# Patient Record
Sex: Male | Born: 1955
Health system: Southern US, Community
[De-identification: ages and names within clinical notes are randomized; demographics above are authoritative.]

## PROBLEM LIST (undated history)

## (undated) DIAGNOSIS — M549 Dorsalgia, unspecified: Secondary | ICD-10-CM

## (undated) DIAGNOSIS — E119 Type 2 diabetes mellitus without complications: Secondary | ICD-10-CM

## (undated) DIAGNOSIS — K219 Gastro-esophageal reflux disease without esophagitis: Secondary | ICD-10-CM

## (undated) DIAGNOSIS — F1910 Other psychoactive substance abuse, uncomplicated: Secondary | ICD-10-CM

## (undated) DIAGNOSIS — E785 Hyperlipidemia, unspecified: Secondary | ICD-10-CM

## (undated) DIAGNOSIS — I509 Heart failure, unspecified: Secondary | ICD-10-CM

## (undated) DIAGNOSIS — N184 Chronic kidney disease, stage 4 (severe): Secondary | ICD-10-CM

## (undated) DIAGNOSIS — Z95810 Presence of automatic (implantable) cardiac defibrillator: Secondary | ICD-10-CM

## (undated) DIAGNOSIS — G8929 Other chronic pain: Secondary | ICD-10-CM

## (undated) DIAGNOSIS — I429 Cardiomyopathy, unspecified: Secondary | ICD-10-CM

## (undated) DIAGNOSIS — K5790 Diverticulosis of intestine, part unspecified, without perforation or abscess without bleeding: Secondary | ICD-10-CM

## (undated) DIAGNOSIS — Z87898 Personal history of other specified conditions: Secondary | ICD-10-CM

## (undated) DIAGNOSIS — I428 Other cardiomyopathies: Secondary | ICD-10-CM

## (undated) DIAGNOSIS — B351 Tinea unguium: Secondary | ICD-10-CM

## (undated) DIAGNOSIS — I10 Essential (primary) hypertension: Secondary | ICD-10-CM

## (undated) DIAGNOSIS — I2510 Atherosclerotic heart disease of native coronary artery without angina pectoris: Secondary | ICD-10-CM

## (undated) HISTORY — PX: APPENDECTOMY: SHX54

## (undated) HISTORY — DX: Gastro-esophageal reflux disease without esophagitis: K21.9

## (undated) HISTORY — DX: Essential (primary) hypertension: I10

## (undated) HISTORY — DX: Hyperlipidemia, unspecified: E78.5

## (undated) HISTORY — DX: Chronic kidney disease, stage 4 (severe): N18.4

## (undated) HISTORY — DX: Tinea unguium: B35.1

## (undated) HISTORY — DX: Other chronic pain: G89.29

## (undated) HISTORY — DX: Cardiomyopathy, unspecified: I42.9

## (undated) HISTORY — DX: Dorsalgia, unspecified: M54.9

## (undated) HISTORY — DX: Presence of automatic (implantable) cardiac defibrillator: Z95.810

## (undated) HISTORY — DX: Diverticulosis of intestine, part unspecified, without perforation or abscess without bleeding: K57.90

## (undated) HISTORY — DX: Personal history of other specified conditions: Z87.898

## (undated) HISTORY — DX: Type 2 diabetes mellitus without complications: E11.9

## (undated) HISTORY — DX: Heart failure, unspecified: I50.9

## (undated) NOTE — *Deleted (*Deleted)
[facility] Note  12/30/2019     CHIEF COMPLAINT Patient presents for No chief complaint on file.   HISTORY OF PRESENT ILLNESS: Brad Mendoza is a 28 y.o. male who presents to the clinic today for:   pt states no change in vision  Referring physician: [name], MD [place],  [place] [address]  HISTORICAL INFORMATION:   Selected notes from the MEDICAL RECORD NUMBER Referred by Dr. [name] for concern of CSME OS;  LEE- 04.04.19 ([name]) [BCVA OD: 20/20 OS: 20/40] Ocular Hx-  PMH- DM, CAD, CHF, HTN, hyperlipidemia    CURRENT MEDICATIONS: No current outpatient medications on file. (Ophthalmic Drugs)   No current facility-administered medications for this visit. (Ophthalmic Drugs)   Current Outpatient Medications (Other)  Medication Sig  . aspirin EC 81 MG tablet Take 81 mg by mouth daily.  . blood glucose meter kit and supplies KIT Dispense based on patient and insurance preference. Test four times daily as directed. (FOR ICD-10 : E11.9  . Blood Glucose Monitoring Suppl (ONETOUCH VERIO) w/Device KIT 1 Device by Does not apply route 2 (two) times daily.  . Capsaicin (ICY HOT ARTHRITIS THERAPY EX) Apply 1 application topically 2 (two) times daily as needed (back pain).   . carvedilol (COREG) 6.25 MG tablet Take 1 tablet (6.25 mg total) by mouth 2 (two) times daily with a meal.  . Cholecalciferol (VITAMIN D3) 25 MCG (1000 UT) CAPS Take 2,000 Units by mouth daily.  . Continuous Blood Gluc Sensor (FREESTYLE LIBRE 2 SENSOR) MISC Use to test blood sugar up to 6 times daily as directed. E11.9  . fluticasone (FLONASE) 50 MCG/ACT nasal spray USE 2 SPRAYS IN EACH NOSTRIL DAILY  . glucose blood (ONETOUCH VERIO) test strip USE FOUR TIMES DAILY AS NEEDED Dx E11.9  . hydrALAZINE (APRESOLINE) 100 MG tablet TAKE (1) TABLET BY MOUTH (3) TIMES DAILY.  [name] icosapent Ethyl (VASCEPA) 1 g capsule Take 2 capsules (2 g total) by mouth 2 (two) times daily.  .  insulin glargine, 2 Unit Dial, (TOUJEO MAX SOLOSTAR) 300 UNIT/ML Solostar Pen Inject 50 units each morning and 40 units each evening  . isosorbide mononitrate (IMDUR) 120 MG 24 hr tablet TAKE (1) TABLET BY MOUTH ONCE DAILY.  [name] Lancets (ONETOUCH ULTRASOFT) lancets Use as instructed   DX E11.9  . loratadine (CLARITIN) 10 MG tablet Take 1 tablet (10 mg total) by mouth daily.  . Multiple Vitamins-Minerals (CENTRUM SILVER ADULT 50+ PO) Take 1 tablet by mouth daily.  . nabumetone (RELAFEN) 500 MG tablet TAKE TWO TABLETS BY MOUTH TWICE DAILY  . nitroGLYCERIN (NITROSTAT) 0.4 MG SL tablet DISSOLVE 1 TAB UNDER TOUNGE FOR CHEST PAIN. MAY REPEAT EVERY 5 MINUTES FOR 3 DOSES. IF NO RELIEF CALL 911 OR GO TO ER  . Omega-3 Fatty Acids (FISH OIL) 1000 MG CPDR Take 1,000 mg by mouth daily.   . pantoprazole (PROTONIX) 40 MG tablet Take 1 tablet (40 mg total) by mouth daily.  . potassium chloride SA (KLOR-CON) 20 MEQ tablet TAKE (1) TABLET BY MOUTH ONCE DAILY.  [name] pregabalin (LYRICA) 200 MG capsule Take 2 capsules (400 mg total) by mouth at bedtime.  [name] PROAIR HFA 108 (90 Base) MCG/ACT inhaler USE 2 PUFFS EVERY 6 HOURS AS NEEDED  . ranolazine (RANEXA) 500 MG 12 hr tablet TAKE 1 TABLET BY MOUTH TWICE DAILY.  . rosuvastatin (CRESTOR) 10 MG tablet TAKE 1 TABLET BY MOUTH ONCE A DAY.  [name]  rosuvastatin (CRESTOR) 20 MG tablet Take 20 mg by mouth daily.  . Semaglutide, 1 MG/DOSE, (OZEMPIC, 1 MG/DOSE,) 2 MG/1.5ML SOPN Inject 1 mg into the skin once a week.  . terbinafine (LAMISIL) 1 % cream Apply 1 application topically 2 (two) times daily. Apply to both feet and between toes  . tiZANidine (ZANAFLEX) 4 MG tablet Take 1 tablet (4 mg total) by mouth every 6 (six) hours as needed for muscle spasms.  [name] torsemide (DEMADEX) 20 MG tablet TAKE 3 TABLETS BY MOUTH DAILY.  [name] Vitamin D, Ergocalciferol, (DRISDOL) 1.25 MG (50000 UNIT) CAPS capsule Take 1 capsule (50,000 Units total) by mouth every 7 (seven) days.  Alveda Reasons 2.5 MG TABS tablet  TAKE (1) TABLET BY MOUTH TWICE DAILY.   No current facility-administered medications for this visit. (Other)      REVIEW OF SYSTEMS:    ALLERGIES Allergies  Allergen Reactions  . Sulfa Antibiotics Shortness Of Breath  . Penicillins Rash and Other (See Comments)    Has patient had a PCN reaction causing immediate rash, facial/tongue/throat swelling, SOB or lightheadedness with hypotension: No Has patient had a PCN reaction causing severe rash involving mucus membranes or skin necrosis: No Has patient had a PCN reaction that required hospitalization No Has patient had a PCN reaction occurring within the last 10 years: No If all of the above answers are "NO", then may proceed with Cephalosporin use.     PAST MEDICAL HISTORY Past Medical History:  Diagnosis Date  . CAD (coronary artery disease)    DES to RCA January 2020  . Chronic back pain   . CKD (chronic kidney disease), stage IV ([place])   . Diverticulosis   . Essential hypertension   . GERD (gastroesophageal reflux disease)   . Hyperlipidemia   . ICD (implantable cardioverter-defibrillator) in place    [facility] [employer] - Dr. Lovena Le, implanted April 2021  . Onychomycosis   . Polysubstance abuse ([place])   . 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 Type 2 diabetes mellitus ([place])    Past Surgical History:  Procedure Laterality Date  . APPENDECTOMY    . CARDIAC CATHETERIZATION  2005   4 frech cath  . COLONOSCOPY N/A 08/01/2013   Procedure: COLONOSCOPY;  Surgeon: Rogene Houston, MD;  [facility];  Service: Endoscopy;  Laterality: N/A;  rescheduled to [place] notified pt  . CORONARY STENT INTERVENTION N/A 03/19/2018   Procedure: CORONARY STENT INTERVENTION;  Surgeon: Leonie Man, MD;  Location: [place] [facility];  Service: Cardiovascular;  Laterality: N/A;  . ESOPHAGOGASTRODUODENOSCOPY N/A 04/23/2015   Procedure: ESOPHAGOGASTRODUODENOSCOPY (EGD);  Surgeon: Rogene Houston, MD;   [facility];  Service: Endoscopy;  Laterality: N/A;  1:25  . RIGHT/LEFT HEART CATH AND CORONARY ANGIOGRAPHY N/A 06/03/2016   Procedure: Right/Left Heart Cath and Coronary Angiography;  Surgeon: Belva Crome, MD;  Location: [place] [facility];  Service: Cardiovascular;  Laterality: N/A;  . RIGHT/LEFT HEART CATH AND CORONARY ANGIOGRAPHY N/A 03/19/2018   Procedure: RIGHT/LEFT HEART CATH AND CORONARY ANGIOGRAPHY;  Surgeon: Jolaine Artist, MD;  Location: [place] [facility];  Service: Cardiovascular;  Laterality: N/A;  . SUBQ ICD IMPLANT N/A 05/27/2019   Procedure: SUBQ ICD IMPLANT;  Surgeon: Evans Lance, MD;  Location: [place] [facility];  Service: Cardiovascular;  Laterality: N/A;    FAMILY HISTORY Family History  Problem Relation Age of Onset  . Hypertension Father   . Diabetes Father   .  Lung cancer Mother   . Alcohol abuse Brother   . Colon cancer Neg Hx     SOCIAL HISTORY Social History   Tobacco Use  . Smoking status: Never Smoker  . Smokeless tobacco: Current User    Types: Snuff  . Tobacco comment: dips snuff about 6 cans/week for 3 years  Vaping Use  . Vaping Use: Never used  Substance Use Topics  . Alcohol use: Yes    Alcohol/week: 1.0 standard drink    Types: 1 Cans of beer per week    Comment: occ - has cut down, now once every 2-3 months  . Drug use: Yes    Types: Marijuana    Comment: occasional use - smoking less         OPHTHALMIC EXAM:  Not recorded     IMAGING AND PROCEDURES  Imaging and Procedures for 05/31/17           ASSESSMENT/PLAN:    ICD-10-CM   1. Moderate nonproliferative diabetic retinopathy of both eyes with macular edema associated with type 2 diabetes mellitus ([place])  [id number]   2. Retinal edema  H35.81   3. Essential hypertension  I10   4. Hypertensive retinopathy of both eyes  H35.033   5. Combined forms of age-related cataract of both eyes  H25.813     1,2. Moderate to severe non-proliferative diabetic  retinopathy, OU  - delayed f/u from 4 wks to 8 mos due to contracting COVID in Dec 2020  - initial exam showed scattered IRH, exudates, and cotton wool spots, OS > OD, no NV  - FA 4.10.19 shows no NV OU  - S/P IVA OS #1 (05.29.19), #2 (06.26.19), #3 (07.29.19), #4 (07.22.21), #5 (09.22.21)  - S/P IVE OS #1 (08.30.19), #2 (10.01.19), #3 (10.31.19), #4 (03.02.20), #5 (04.07.20), #6 (07.01.20), #7 (11.12.20), #8 (07.22.21), #9 (09.22.21)  - S/P IVA OD #1 (04.07.20), #2 (07.01.20), #3 (11.12.20), #4 (07.22.2021)  - last A1c >14% on 7.13.21 -- pt reports poor control due to poor diet  - exam shows DME / cystic changes OU  - OCT shows interval increase in diabetic macular edema OD; interval improvement in IRF/cystic changes OS  - BCVA 20/40 OD (stable), 20/50 OS (dec from 20/60)  - recommend IVA #6 OD and IVE #10 OS today, 11.08.21  - pt wishes to proceed  - RBA of procedure discussed, questions answered  - informed consent obtained and signed  - see procedure note  - Eylea[employer]U paper work and benefits investigation started on 07.29.19 -- Approved for Good Days for 2021  - f/u in 4 weeks -- DFE; OCT; possible injection  3,4. Hypertensive retinopathy OU  - discussed importance of tight BP control  - monitor  5. Combined form age-related cataract OU-   - The symptoms of cataract, surgical options, and treatments and risks were discussed with patient.  - discussed diagnosis and progression  - not yet visually significant  - monitor for now   Ophthalmic Meds Ordered this visit:  No orders of the defined types were placed in this encounter.      No follow-ups on file.  There are no Patient Instructions on file for this visit.  This document serves as a record of services personally performed by Gardiner Sleeper, MD, PhD. It was created on their behalf by Leeann Must, [place], an ophthalmic technician. The creation of this record is the provider's dictation and/or activities during the visit.     Electronically signed by: Leeann Must,  COA @TODAY @ 1:56 PM   Abbreviations: M myopia (nearsighted); A astigmatism; H hyperopia (farsighted); P presbyopia; Mrx spectacle prescription;  CTL contact lenses; OD right eye; OS left eye; OU both eyes  XT exotropia; ET esotropia; PEK punctate epithelial keratitis; PEE punctate epithelial erosions; DES dry eye syndrome; MGD meibomian gland dysfunction; ATs artificial tears; PFAT's preservative free artificial tears; [place] nuclear sclerotic cataract; PSC posterior subcapsular cataract; ERM epi-retinal membrane; PVD posterior vitreous detachment; RD retinal detachment; DM diabetes mellitus; DR diabetic retinopathy; NPDR non-proliferative diabetic retinopathy; PDR proliferative diabetic retinopathy; CSME clinically significant macular edema; DME diabetic macular edema; dbh dot blot hemorrhages; CWS cotton wool spot; POAG primary open angle glaucoma; C/D cup-to-disc ratio; HVF humphrey visual field; GVF goldmann visual field; OCT optical coherence tomography; IOP intraocular pressure; BRVO Branch retinal vein occlusion; CRVO central retinal vein occlusion; CRAO central retinal artery occlusion; BRAO branch retinal artery occlusion; RT retinal tear; SB scleral buckle; PPV pars plana vitrectomy; VH Vitreous hemorrhage; PRP panretinal laser photocoagulation; IVK intravitreal kenalog; VMT vitreomacular traction; MH Macular hole;  NVD neovascularization of the disc; NVE neovascularization elsewhere; AREDS age related eye disease study; ARMD age related macular degeneration; POAG primary open angle glaucoma; EBMD epithelial/anterior basement membrane dystrophy; ACIOL anterior chamber intraocular lens; IOL intraocular lens; PCIOL posterior chamber intraocular lens; Phaco/IOL phacoemulsification with intraocular lens placement; [place] photorefractive keratectomy; LASIK laser assisted in situ keratomileusis; HTN hypertension; DM diabetes mellitus; COPD chronic obstructive pulmonary  disease

## (undated) NOTE — *Deleted (*Deleted)
[facility] Note  12/11/2019     CHIEF COMPLAINT Patient presents for No chief complaint on file.   HISTORY OF PRESENT ILLNESS: Brad Mendoza is a 46 y.o. male who presents to the clinic today for:   pt states no change in vision  Referring physician: [name], MD [place],  [place] [address]  HISTORICAL INFORMATION:   Selected notes from the MEDICAL RECORD NUMBER Referred by Dr. Milagros Mendoza for concern of CSME OS;  LEE- 04.04.19 ([name]) [BCVA OD: 20/20 OS: 20/40] Ocular Hx-  PMH- DM, CAD, CHF, HTN, hyperlipidemia    CURRENT MEDICATIONS: No current outpatient medications on file. (Ophthalmic Drugs)   No current facility-administered medications for this visit. (Ophthalmic Drugs)   Current Outpatient Medications (Other)  Medication Sig  . aspirin EC 81 MG tablet Take 81 mg by mouth daily.  . blood glucose meter kit and supplies KIT Dispense based on patient and insurance preference. Test four times daily as directed. (FOR ICD-10 : E11.9  . Blood Glucose Monitoring Suppl (ONETOUCH VERIO) w/Device KIT 1 Device by Does not apply route 2 (two) times daily.  . Capsaicin (ICY HOT ARTHRITIS THERAPY EX) Apply 1 application topically 2 (two) times daily as needed (back pain).   . carvedilol (COREG) 6.25 MG tablet Take 1 tablet (6.25 mg total) by mouth 2 (two) times daily with a meal.  . Cholecalciferol (VITAMIN D3) 25 MCG (1000 UT) CAPS Take 2,000 Units by mouth daily.  . Continuous Blood Gluc Sensor (FREESTYLE LIBRE 2 SENSOR) MISC Use to test blood sugar up to 6 times daily as directed. E11.9  . fluticasone (FLONASE) 50 MCG/ACT nasal spray USE 2 SPRAYS IN EACH NOSTRIL DAILY  . glucose blood (ONETOUCH VERIO) test strip USE FOUR TIMES DAILY AS NEEDED Dx E11.9  . hydrALAZINE (APRESOLINE) 100 MG tablet TAKE (1) TABLET BY MOUTH (3) TIMES DAILY.  [name] icosapent Ethyl (VASCEPA) 1 g capsule Take 2 capsules (2 g total) by mouth 2 (two) times daily.  .  insulin glargine, 2 Unit Dial, (TOUJEO MAX SOLOSTAR) 300 UNIT/ML Solostar Pen Inject 50 units each morning and 40 units each evening  . isosorbide mononitrate (IMDUR) 120 MG 24 hr tablet TAKE (1) TABLET BY MOUTH ONCE DAILY.  [name] Lancets (ONETOUCH ULTRASOFT) lancets Use as instructed   DX E11.9  . loratadine (CLARITIN) 10 MG tablet Take 1 tablet (10 mg total) by mouth daily.  . Multiple Vitamins-Minerals (CENTRUM SILVER ADULT 50+ PO) Take 1 tablet by mouth daily.  . nabumetone (RELAFEN) 500 MG tablet TAKE TWO TABLETS BY MOUTH TWICE DAILY  . nitroGLYCERIN (NITROSTAT) 0.4 MG SL tablet DISSOLVE 1 TAB UNDER TOUNGE FOR CHEST PAIN. MAY REPEAT EVERY 5 MINUTES FOR 3 DOSES. IF NO RELIEF CALL 911 OR GO TO ER  . Omega-3 Fatty Acids (FISH OIL) 1000 MG CPDR Take 1,000 mg by mouth daily.   . pantoprazole (PROTONIX) 40 MG tablet Take 1 tablet (40 mg total) by mouth daily.  . potassium chloride SA (KLOR-CON) 20 MEQ tablet TAKE (1) TABLET BY MOUTH ONCE DAILY.  [name] pregabalin (LYRICA) 200 MG capsule Take 2 capsules (400 mg total) by mouth at bedtime.  [name] PROAIR HFA 108 (90 Base) MCG/ACT inhaler USE 2 PUFFS EVERY 6 HOURS AS NEEDED  . ranolazine (RANEXA) 500 MG 12 hr tablet TAKE 1 TABLET BY MOUTH TWICE DAILY.  . rosuvastatin (CRESTOR) 20 MG tablet Take 20 mg by mouth daily.  . Semaglutide, 1  MG/DOSE, (OZEMPIC, 1 MG/DOSE,) 2 MG/1.5ML SOPN Inject 1 mg into the skin once a week.  . Semaglutide,0.25 or 0.5MG /DOS, (OZEMPIC, 0.25 OR 0.5 MG/DOSE,) 2 MG/1.5ML SOPN Inject 0.5 mg into the skin once a week.  . terbinafine (LAMISIL) 1 % cream Apply 1 application topically 2 (two) times daily. Apply to both feet and between toes  . tiZANidine (ZANAFLEX) 4 MG tablet Take 1 tablet (4 mg total) by mouth every 6 (six) hours as needed for muscle spasms.  [name] torsemide (DEMADEX) 20 MG tablet TAKE 3 TABLETS BY MOUTH DAILY.  [name] XARELTO 2.5 MG TABS tablet TAKE (1) TABLET BY MOUTH TWICE DAILY.   No current facility-administered medications for  this visit. (Other)      REVIEW OF SYSTEMS:    ALLERGIES Allergies  Allergen Reactions  . Sulfa Antibiotics Shortness Of Breath  . Penicillins Rash and Other (See Comments)    Has patient had a PCN reaction causing immediate rash, facial/tongue/throat swelling, SOB or lightheadedness with hypotension: No Has patient had a PCN reaction causing severe rash involving mucus membranes or skin necrosis: No Has patient had a PCN reaction that required hospitalization No Has patient had a PCN reaction occurring within the last 10 years: No If all of the above answers are "NO", then may proceed with Cephalosporin use.     PAST MEDICAL HISTORY Past Medical History:  Diagnosis Date  . CAD (coronary artery disease)    DES to RCA January 2020  . Chronic back pain   . CKD (chronic kidney disease), stage IV ([place])   . Diverticulosis   . Essential hypertension   . GERD (gastroesophageal reflux disease)   . Hyperlipidemia   . ICD (implantable cardioverter-defibrillator) in place    [facility] - Dr. Lovena Le, implanted April 2021  . Onychomycosis   . Polysubstance abuse ([place] Mendoza)   . 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 Type 2 diabetes mellitus ([name])    Past Surgical History:  Procedure Laterality Date  . APPENDECTOMY    . CARDIAC CATHETERIZATION  2005   4 frech cath  . COLONOSCOPY N/A 08/01/2013   Procedure: COLONOSCOPY;  Surgeon: Brad Houston, MD;  [facility];  Service: Endoscopy;  Laterality: N/A;  rescheduled to [place] notified pt  . CORONARY STENT INTERVENTION N/A 03/19/2018   Procedure: CORONARY STENT INTERVENTION;  Surgeon: Brad Man, MD;  Location: [place] [facility];  Service: Cardiovascular;  Laterality: N/A;  . ESOPHAGOGASTRODUODENOSCOPY N/A 04/23/2015   Procedure: ESOPHAGOGASTRODUODENOSCOPY (EGD);  Surgeon: Brad Houston, MD;  [facility];  Service: Endoscopy;  Laterality: N/A;  1:25  . RIGHT/LEFT HEART  CATH AND CORONARY ANGIOGRAPHY N/A 06/03/2016   Procedure: Right/Left Heart Cath and Coronary Angiography;  Surgeon: Belva Crome, MD;  Location: [place] [facility];  Service: Cardiovascular;  Laterality: N/A;  . RIGHT/LEFT HEART CATH AND CORONARY ANGIOGRAPHY N/A 03/19/2018   Procedure: RIGHT/LEFT HEART CATH AND CORONARY ANGIOGRAPHY;  Surgeon: Jolaine Artist, MD;  Location: [place] [facility];  Service: Cardiovascular;  Laterality: N/A;  . SUBQ ICD IMPLANT N/A 05/27/2019   Procedure: SUBQ ICD IMPLANT;  Surgeon: Evans Lance, MD;  Location: [place] [facility];  Service: Cardiovascular;  Laterality: N/A;    FAMILY HISTORY Family History  Problem Relation Age of Onset  . Hypertension Father   . Diabetes Father   . Lung cancer Mother   . Alcohol abuse Brother   . Colon cancer  Neg Hx     SOCIAL HISTORY Social History   Tobacco Use  . Smoking status: Never Smoker  . Smokeless tobacco: Current User    Types: Snuff  . Tobacco comment: dips snuff about 6 cans/week for 3 years  Vaping Use  . Vaping Use: Never used  Substance Use Topics  . Alcohol use: Yes    Alcohol/week: 1.0 standard drink    Types: 1 Cans of beer per week    Comment: occ - has cut down, now once every 2-3 months  . Drug use: Yes    Types: Marijuana    Comment: occasional use - smoking less         OPHTHALMIC EXAM:  Not recorded     IMAGING AND PROCEDURES  Imaging and Procedures for 05/31/17           ASSESSMENT/PLAN:    ICD-10-CM   1. Moderate nonproliferative diabetic retinopathy of both eyes with macular edema associated with type 2 diabetes mellitus ([place])  [id number]   2. Retinal edema  H35.81   3. Essential hypertension  I10   4. Hypertensive retinopathy of both eyes  H35.033   5. Combined forms of age-related cataract of both eyes  H25.813     1,2. Moderate to severe non-proliferative diabetic retinopathy, OU  - delayed f/u from 4 wks to 8 mos due to contracting COVID in Dec 2020  -  initial exam showed scattered [place], exudates, and cotton wool spots, OS > OD, no NV  - FA 4.10.19 shows no NV OU  - S/P IVA OS #1 (05.29.19), #2 (06.26.19), #3 (07.29.19), #4 (07.22.21)  - S/P IVE OS #1 (08.30.19), #2 (10.01.19), #3 (10.31.19), #4 (03.02.20), #5 (04.07.20), #6 (07.01.20), #7 (11.12.20), #8 (07.22.21), #9 (09.22.2021)  - S/P IVA OD #1 (04.07.20), #2 (07.01.20), #3 (11.12.20), #4 (07.22.2021), #5 (09.22.2021)  - last A1c >14% on 7.13.21 -- pt reports poor control due to poor diet  - exam shows DME / cystic changes OU  - OCT shows interval increase in diabetic macular edema OD; interval improvement in IRF/cystic changes OS  - BCVA 20/40 OD (stable), 20/50 OS (dec from 20/60)  - recommend IVA #6 OD and IVE #10 OS today, 10.20.21  - pt wishes to proceed  - RBA of procedure discussed, questions answered  - informed consent obtained and signed  - see procedure note  - Eylea[employer]U paper work and benefits investigation started on 07.29.19 -- Approved for Good Days for 2021  - f/u in 4 weeks -- DFE; OCT; possible injection  3,4. Hypertensive retinopathy OU  - discussed importance of tight BP control  - monitor  5. Combined form age-related cataract OU-   - The symptoms of cataract, surgical options, and treatments and risks were discussed with patient.  - discussed diagnosis and progression  - not yet visually significant  - monitor for now   Ophthalmic Meds Ordered this visit:  No orders of the defined types were placed in this encounter.      No follow-ups on file.  There are no Patient Instructions on file for this visit.   Explained the diagnoses, plan, and follow up with the patient and they expressed understanding.  Patient expressed understanding of the importance of proper follow up care.   This document serves as a record of services personally performed by Gardiner Sleeper, MD, PhD. It was created on their behalf by Roselee Nova, [place]. The creation of this record  is the provider's dictation and/or  activities during the visit.  Electronically signed by: Roselee Nova, [place] 12/09/19 12:14 PM    Gardiner Sleeper, M.D., Ph.D. Diseases & Surgery of the Retina and Vitreous [facility]: M myopia (nearsighted); A astigmatism; H hyperopia (farsighted); P presbyopia; Mrx spectacle prescription;  CTL contact lenses; OD right eye; OS left eye; OU both eyes  XT exotropia; ET esotropia; PEK punctate epithelial keratitis; PEE punctate epithelial erosions; DES dry eye syndrome; MGD meibomian gland dysfunction; ATs artificial tears; PFAT's preservative free artificial tears; [place] nuclear sclerotic cataract; PSC posterior subcapsular cataract; ERM epi-retinal membrane; PVD posterior vitreous detachment; RD retinal detachment; DM diabetes mellitus; DR diabetic retinopathy; NPDR non-proliferative diabetic retinopathy; PDR proliferative diabetic retinopathy; CSME clinically significant macular edema; DME diabetic macular edema; dbh dot blot hemorrhages; CWS cotton wool spot; POAG primary open angle glaucoma; C/D cup-to-disc ratio; HVF humphrey visual field; GVF goldmann visual field; OCT optical coherence tomography; IOP intraocular pressure; BRVO Branch retinal vein occlusion; CRVO central retinal vein occlusion; CRAO central retinal artery occlusion; BRAO branch retinal artery occlusion; RT retinal tear; SB scleral buckle; PPV pars plana vitrectomy; VH Vitreous hemorrhage; PRP panretinal laser photocoagulation; IVK intravitreal kenalog; VMT vitreomacular traction; MH Macular hole;  NVD neovascularization of the disc; NVE neovascularization elsewhere; AREDS age related eye disease study; ARMD age related macular degeneration; POAG primary open angle glaucoma; EBMD epithelial/anterior basement membrane dystrophy; ACIOL anterior chamber intraocular lens; IOL intraocular lens; PCIOL posterior chamber intraocular lens; Phaco/IOL phacoemulsification  with intraocular lens placement; [place] photorefractive keratectomy; LASIK laser assisted in situ keratomileusis; HTN hypertension; DM diabetes mellitus; COPD chronic obstructive pulmonary disease

---

## 2000-06-01 ENCOUNTER — Ambulatory Visit (HOSPITAL_COMMUNITY): Admission: RE | Admit: 2000-06-01 | Discharge: 2000-06-01 | Payer: Self-pay | Admitting: Family Medicine

## 2000-06-01 ENCOUNTER — Encounter: Payer: Self-pay | Admitting: Family Medicine

## 2000-07-11 ENCOUNTER — Encounter: Payer: Self-pay | Admitting: Neurosurgery

## 2000-07-11 ENCOUNTER — Encounter: Admission: RE | Admit: 2000-07-11 | Discharge: 2000-07-11 | Payer: Self-pay | Admitting: Neurosurgery

## 2000-07-25 ENCOUNTER — Encounter: Payer: Self-pay | Admitting: Neurosurgery

## 2000-07-25 ENCOUNTER — Encounter: Admission: RE | Admit: 2000-07-25 | Discharge: 2000-07-25 | Payer: Self-pay | Admitting: Neurosurgery

## 2000-07-27 ENCOUNTER — Emergency Department (HOSPITAL_COMMUNITY): Admission: EM | Admit: 2000-07-27 | Discharge: 2000-07-27 | Payer: Self-pay | Admitting: *Deleted

## 2000-08-08 ENCOUNTER — Encounter: Payer: Self-pay | Admitting: Neurosurgery

## 2000-08-08 ENCOUNTER — Encounter: Admission: RE | Admit: 2000-08-08 | Discharge: 2000-08-08 | Payer: Self-pay | Admitting: Neurosurgery

## 2001-07-01 ENCOUNTER — Encounter: Payer: Self-pay | Admitting: Emergency Medicine

## 2001-07-01 ENCOUNTER — Emergency Department (HOSPITAL_COMMUNITY): Admission: EM | Admit: 2001-07-01 | Discharge: 2001-07-01 | Payer: Self-pay | Admitting: Internal Medicine

## 2001-10-23 ENCOUNTER — Encounter (HOSPITAL_COMMUNITY): Admission: RE | Admit: 2001-10-23 | Discharge: 2001-11-22 | Payer: Self-pay | Admitting: Neurosurgery

## 2001-11-22 ENCOUNTER — Encounter (HOSPITAL_COMMUNITY): Admission: RE | Admit: 2001-11-22 | Discharge: 2001-12-22 | Payer: Self-pay | Admitting: Neurosurgery

## 2002-03-09 ENCOUNTER — Emergency Department (HOSPITAL_COMMUNITY): Admission: EM | Admit: 2002-03-09 | Discharge: 2002-03-09 | Payer: Self-pay | Admitting: Emergency Medicine

## 2002-07-12 ENCOUNTER — Encounter: Payer: Self-pay | Admitting: Radiology

## 2002-07-12 ENCOUNTER — Encounter: Payer: Self-pay | Admitting: Neurosurgery

## 2002-07-12 ENCOUNTER — Encounter: Admission: RE | Admit: 2002-07-12 | Discharge: 2002-07-12 | Payer: Self-pay | Admitting: Neurosurgery

## 2002-07-26 ENCOUNTER — Encounter: Admission: RE | Admit: 2002-07-26 | Discharge: 2002-07-26 | Payer: Self-pay | Admitting: Neurosurgery

## 2002-07-26 ENCOUNTER — Encounter: Payer: Self-pay | Admitting: Neurosurgery

## 2002-08-05 ENCOUNTER — Encounter: Payer: Self-pay | Admitting: Family Medicine

## 2002-08-05 ENCOUNTER — Ambulatory Visit (HOSPITAL_COMMUNITY): Admission: RE | Admit: 2002-08-05 | Discharge: 2002-08-05 | Payer: Self-pay | Admitting: Family Medicine

## 2002-08-07 ENCOUNTER — Emergency Department (HOSPITAL_COMMUNITY): Admission: EM | Admit: 2002-08-07 | Discharge: 2002-08-07 | Payer: Self-pay | Admitting: Emergency Medicine

## 2002-08-09 ENCOUNTER — Encounter: Admission: RE | Admit: 2002-08-09 | Discharge: 2002-08-09 | Payer: Self-pay | Admitting: Neurosurgery

## 2002-08-09 ENCOUNTER — Encounter: Payer: Self-pay | Admitting: Neurosurgery

## 2002-08-19 ENCOUNTER — Ambulatory Visit (HOSPITAL_COMMUNITY): Admission: RE | Admit: 2002-08-19 | Discharge: 2002-08-19 | Payer: Self-pay | Admitting: Family Medicine

## 2002-08-19 ENCOUNTER — Encounter: Payer: Self-pay | Admitting: Family Medicine

## 2003-02-22 HISTORY — PX: CARDIAC CATHETERIZATION: SHX172

## 2003-07-31 ENCOUNTER — Encounter (HOSPITAL_COMMUNITY): Admission: RE | Admit: 2003-07-31 | Discharge: 2003-08-01 | Payer: Self-pay | Admitting: *Deleted

## 2003-08-28 ENCOUNTER — Ambulatory Visit (HOSPITAL_COMMUNITY): Admission: RE | Admit: 2003-08-28 | Discharge: 2003-08-28 | Payer: Self-pay | Admitting: *Deleted

## 2003-08-29 ENCOUNTER — Inpatient Hospital Stay (HOSPITAL_BASED_OUTPATIENT_CLINIC_OR_DEPARTMENT_OTHER): Admission: RE | Admit: 2003-08-29 | Discharge: 2003-08-29 | Payer: Self-pay | Admitting: Cardiology

## 2003-08-29 ENCOUNTER — Encounter: Payer: Self-pay | Admitting: Cardiology

## 2003-09-22 ENCOUNTER — Ambulatory Visit (HOSPITAL_COMMUNITY): Admission: RE | Admit: 2003-09-22 | Discharge: 2003-09-22 | Payer: Self-pay | Admitting: Family Medicine

## 2003-09-24 ENCOUNTER — Ambulatory Visit (HOSPITAL_COMMUNITY): Admission: RE | Admit: 2003-09-24 | Discharge: 2003-09-24 | Payer: Self-pay | Admitting: Family Medicine

## 2004-02-05 ENCOUNTER — Ambulatory Visit: Payer: Self-pay | Admitting: Family Medicine

## 2004-07-02 ENCOUNTER — Ambulatory Visit: Payer: Self-pay | Admitting: Family Medicine

## 2004-07-12 ENCOUNTER — Ambulatory Visit: Payer: Self-pay | Admitting: Family Medicine

## 2004-08-31 ENCOUNTER — Emergency Department: Payer: Self-pay | Admitting: General Practice

## 2004-08-31 ENCOUNTER — Other Ambulatory Visit: Payer: Self-pay

## 2004-10-19 ENCOUNTER — Ambulatory Visit: Payer: Self-pay | Admitting: Family Medicine

## 2005-02-15 ENCOUNTER — Ambulatory Visit: Payer: Self-pay | Admitting: Family Medicine

## 2005-02-28 ENCOUNTER — Other Ambulatory Visit: Payer: Self-pay

## 2005-03-01 ENCOUNTER — Inpatient Hospital Stay: Payer: Self-pay

## 2005-04-05 ENCOUNTER — Inpatient Hospital Stay: Payer: Self-pay | Admitting: Internal Medicine

## 2005-04-05 ENCOUNTER — Other Ambulatory Visit: Payer: Self-pay

## 2005-04-06 ENCOUNTER — Other Ambulatory Visit: Payer: Self-pay

## 2005-08-03 ENCOUNTER — Other Ambulatory Visit: Payer: Self-pay

## 2005-08-03 ENCOUNTER — Emergency Department: Payer: Self-pay | Admitting: Emergency Medicine

## 2005-10-14 ENCOUNTER — Other Ambulatory Visit: Payer: Self-pay

## 2005-10-14 ENCOUNTER — Emergency Department: Payer: Self-pay | Admitting: Emergency Medicine

## 2005-12-06 ENCOUNTER — Emergency Department: Payer: Self-pay | Admitting: General Practice

## 2006-01-02 ENCOUNTER — Emergency Department: Payer: Self-pay

## 2006-03-30 ENCOUNTER — Other Ambulatory Visit: Payer: Self-pay

## 2006-03-30 ENCOUNTER — Inpatient Hospital Stay: Payer: Self-pay | Admitting: Internal Medicine

## 2006-05-04 ENCOUNTER — Ambulatory Visit: Payer: Self-pay | Admitting: Family Medicine

## 2006-07-25 ENCOUNTER — Ambulatory Visit: Payer: Self-pay

## 2006-07-26 ENCOUNTER — Ambulatory Visit: Payer: Self-pay | Admitting: Internal Medicine

## 2007-08-11 ENCOUNTER — Ambulatory Visit: Payer: Self-pay | Admitting: Cardiology

## 2008-01-05 ENCOUNTER — Ambulatory Visit: Payer: Self-pay | Admitting: Cardiology

## 2008-01-05 ENCOUNTER — Encounter: Payer: Self-pay | Admitting: Physician Assistant

## 2008-02-07 ENCOUNTER — Ambulatory Visit: Payer: Self-pay | Admitting: Internal Medicine

## 2008-03-14 ENCOUNTER — Ambulatory Visit: Payer: Self-pay | Admitting: Internal Medicine

## 2008-03-14 ENCOUNTER — Ambulatory Visit (HOSPITAL_COMMUNITY): Admission: RE | Admit: 2008-03-14 | Discharge: 2008-03-14 | Payer: Self-pay | Admitting: Internal Medicine

## 2008-05-06 ENCOUNTER — Emergency Department: Payer: Self-pay | Admitting: Emergency Medicine

## 2008-05-18 ENCOUNTER — Emergency Department: Payer: Self-pay | Admitting: Emergency Medicine

## 2008-06-26 ENCOUNTER — Ambulatory Visit: Payer: Self-pay | Admitting: Nurse Practitioner

## 2008-07-18 ENCOUNTER — Ambulatory Visit: Payer: Self-pay | Admitting: Internal Medicine

## 2008-07-22 ENCOUNTER — Ambulatory Visit: Payer: Self-pay | Admitting: Internal Medicine

## 2008-08-04 ENCOUNTER — Ambulatory Visit: Payer: Self-pay | Admitting: Internal Medicine

## 2008-12-15 ENCOUNTER — Emergency Department: Payer: Self-pay | Admitting: Emergency Medicine

## 2008-12-22 ENCOUNTER — Ambulatory Visit: Payer: Self-pay

## 2008-12-27 ENCOUNTER — Emergency Department: Payer: Self-pay | Admitting: Emergency Medicine

## 2009-02-10 ENCOUNTER — Encounter: Payer: Self-pay | Admitting: Family Medicine

## 2009-05-04 ENCOUNTER — Ambulatory Visit: Payer: Self-pay

## 2009-11-06 ENCOUNTER — Ambulatory Visit: Payer: Self-pay | Admitting: Cardiology

## 2009-11-06 ENCOUNTER — Encounter: Payer: Self-pay | Admitting: Physician Assistant

## 2009-11-08 ENCOUNTER — Encounter: Payer: Self-pay | Admitting: Cardiology

## 2009-11-16 ENCOUNTER — Ambulatory Visit: Payer: Self-pay | Admitting: Cardiology

## 2009-11-16 DIAGNOSIS — I428 Other cardiomyopathies: Secondary | ICD-10-CM | POA: Insufficient documentation

## 2009-11-16 DIAGNOSIS — F17200 Nicotine dependence, unspecified, uncomplicated: Secondary | ICD-10-CM

## 2009-11-16 DIAGNOSIS — E669 Obesity, unspecified: Secondary | ICD-10-CM

## 2009-11-16 DIAGNOSIS — E785 Hyperlipidemia, unspecified: Secondary | ICD-10-CM

## 2009-11-16 DIAGNOSIS — I2510 Atherosclerotic heart disease of native coronary artery without angina pectoris: Secondary | ICD-10-CM | POA: Insufficient documentation

## 2009-11-16 DIAGNOSIS — I10 Essential (primary) hypertension: Secondary | ICD-10-CM

## 2009-11-16 DIAGNOSIS — R079 Chest pain, unspecified: Secondary | ICD-10-CM | POA: Insufficient documentation

## 2009-11-18 ENCOUNTER — Encounter: Payer: Self-pay | Admitting: Cardiology

## 2010-01-26 ENCOUNTER — Encounter (INDEPENDENT_AMBULATORY_CARE_PROVIDER_SITE_OTHER): Payer: Self-pay | Admitting: *Deleted

## 2010-03-23 NOTE — Consult Note (Signed)
Summary: CARDIOLOGY CONSULT/ [place] CONSULT/ [place]   Imported By: [name] 11/13/2009 16:48:29  _____________________________________________________________________  External Attachment:    Type:   Image     Comment:   External Document

## 2010-03-23 NOTE — Letter (Signed)
Summary: [facility] D/C DR. VAIDYA  [facility] D/C DR. VAIDYA   Imported By: [name] 11/13/2009 16:47:30  _____________________________________________________________________  External Attachment:    Type:   Image     Comment:   External Document

## 2010-03-23 NOTE — Letter (Signed)
Summary: [facility], general practice at [place]. [address], [place] [address]   Phone: [phone]  Fax: [phone]        January 26, 2010 MRN: [id number]    Brad Mendoza [place], [employer]  [id number]    Dear Mr. [name],  On 11/16/2009, our office requested that you have the following lab test 7-10 days after your office visit.  These labs were requested to make sure kidney function and electrolytes were okay after medication increase and the addition of Coreg.   Our records indicate that you have not had the above testing done yet.  Please take the enclosed order to the [facility] at your earliest convenience.             Sincerely,  Lovina Reach, [name]  This letter has been electronically signed by your physician.

## 2010-03-23 NOTE — Consult Note (Signed)
Summary: CARDIOLOGY CONSULT/ [facility]/ DR. GOLDEN  CARDIOLOGY CONSULT/ [facility]/ DR. GOLDEN   Imported By: [name] 11/13/2009 16:48:56  _____________________________________________________________________  External Attachment:    Type:   Image     Comment:   External Document

## 2010-03-23 NOTE — Assessment & Plan Note (Signed)
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 He has had 2 prior cardiac catheterizations, both in 2005 2008. He had a 50% LAD stenosis. His ejection fraction recently determined at 25-30%. He was admitted with congestive heart failure. The patient does have a history of polysubstance abuse. He also had Cardiolite in 2009 was negative for ischemia. He ruled out for myocardial infarction during his recent hospitalization. He was started on ACE inhibitors and spironolactone and presents for followup.  He reports a runny nose. Denies any fevers or chills. NYHA class II with no shortness of breath at rest. He denies exertional chest pain orthopnea PND. The patient did not bring his medications but reports to be compliant with his medical regimen.  Preventive Screening-Counseling & Management  Alcohol-Tobacco     Smoking Status: never     Cans of tobacco/week: dip snuff 5 cans/week     Tobacco Counseling: not to resume use of tobacco products  Current Medications (verified): 1)  Nexium 40 Mg Cpdr (Esomeprazole Magnesium) .... Take 1 Tablet By Mouth Once A Day 2)  Aspir-Low 81 Mg Tbec (Aspirin) .... Take 1 Tablet By Mouth Once A Day 3)  Novolin N 100 Unit/ml Susp (Insulin Isophane Human) .... Use As Directed 4)  Furosemide 40 Mg Tabs (Furosemide) .... Take 1 Tablet By Mouth Once A Day 5)  Aldactone 25 Mg Tabs (Spironolactone) .... Take 1 Tablet By Mouth Once A Day 6)  Lisinopril 10 Mg Tabs (Lisinopril) .... Take 1 Tablet By Mouth Once A Day 7)  Coreg 3.125 Mg Tabs (Carvedilol) .... Take 1 Tablet By Mouth Two Times A Day  Allergies: 1)  ! Pcn 2)  ! Sulfa  Comments:  Nurse/Medical Assistant: The patient is currently on medications but does not know the name or dosage at this time. Instructed to contact our office with details. Will update medication list  at that time.  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 Repeat cardiac catheterization 2008 at [facility] no change Cardiolite 2009 with no ischemia Hypertension Dyslipidemia Polysubstance abuse  Family History: Reviewed history and no changes required. noncontributory  Social History: Reviewed history and no changes required. Smoking Status:  never Cans of tobacco/week:  dip snuff 5 cans/week  Review of Systems  The patient denies fatigue, malaise, fever, weight gain/loss, vision loss, decreased hearing, hoarseness, chest pain, palpitations, shortness of breath, prolonged cough, wheezing, sleep apnea, coughing up blood, abdominal pain, blood in stool, nausea, vomiting, diarrhea, heartburn, incontinence, blood in urine, muscle weakness, joint pain, leg swelling, rash, skin lesions, headache, fainting, dizziness, depression, anxiety, enlarged lymph nodes, easy bruising or bleeding, and environmental allergies.    Vital Signs:  Patient profile:   55 year old male Height:      69 inches Weight:      242 pounds BMI:     35.87 Pulse rate:   82 / minute BP sitting:   131 / 85  (left arm) Cuff size:   large  Vitals Entered By: Georgina Peer (November 16, 2009 9:59 AM)  Nutrition Counseling: Patient's BMI is greater than 25 and therefore counseled on weight management options.  Physical Exam  Additional Exam:  General: Well-developed, well-nourished in no distress head: Normocephalic and atraumatic eyes PERRLA/EOMI intact, conjunctiva and lids normal nose: No deformity or lesions  mouth normal dentition, normal posterior pharynx neck: Supple, no JVD.  No masses, thyromegaly or abnormal cervical nodes lungs: Normal breath sounds bilaterally without wheezing.  Normal percussion heart: regular rate and rhythm with normal S1 and S2, no S3 or S4.  PMI is normal.  No pathological  murmurs abdomen: Normal bowel sounds, abdomen is soft and nontender without masses, organomegaly or hernias noted.  No hepatosplenomegaly musculoskeletal: Back normal, normal gait muscle strength and tone normal pulsus: Pulse is normal in all 4 extremities Extremities: No peripheral pitting edema neurologic: Alert and oriented x 3 skin: Intact without lesions or rashes cervical nodes: No significant adenopathy psychologic: Normal affect    Impression & Recommendations:  Problem # 1:  OTHER PRIMARY CARDIOMYOPATHIES (ICD-425.4) although the patient has history of a 50% LAD stenosis, also presume that his current myopathies probably nonischemic. We'll first treated aggressively with medical therapy first before considering another cardiac catheterization the latter would only be indicated if his ejection fraction does not improve in her symptoms worsen. Increase lisinopril to 77milligrams p.o. q. daily and added carvedilol 3.125 milligrams p.o. b.i.d. His updated medication list for this problem includes:    Aspir-low 81 Mg Tbec (Aspirin) .[name]... Take 1 tablet by mouth once a day    Furosemide 40 Mg Tabs (Furosemide) .[name]... Take 1 tablet by mouth once a day    Aldactone 25 Mg Tabs (Spironolactone) .[name]... Take 1 tablet by mouth once a day    Lisinopril 5 Mg Tabs (Lisinopril) .[name]... Take 1 tablet by mouth once a day  His updated medication list for this problem includes:    Aspir-low 81 Mg Tbec (Aspirin) .[name]... Take 1 tablet by mouth once a day    Furosemide 40 Mg Tabs (Furosemide) .[name]... Take 1 tablet by mouth once a day    Aldactone 25 Mg Tabs (Spironolactone) .[name]... Take 1 tablet by mouth once a day    Lisinopril 10 Mg Tabs (Lisinopril) .[name]... Take 1 tablet by mouth once a day    Coreg 3.125 Mg Tabs (Carvedilol) .[name]... Take 1 tablet by mouth two times a day  Problem # 2:  HYPERTENSION (ICD-401.9)  blood pressure is well controlled. His updated medication list for this problem includes:     Aspir-low 81 Mg Tbec (Aspirin) .[name]... Take 1 tablet by mouth once a day    Furosemide 40 Mg Tabs (Furosemide) .[name]... Take 1 tablet by mouth once a day    Aldactone 25 Mg Tabs (Spironolactone) .[name]... Take 1 tablet by mouth once a day    Lisinopril 10 Mg Tabs (Lisinopril) .[name]... Take 1 tablet by mouth once a day    Coreg 3.125 Mg Tabs (Carvedilol) .[name]... Take 1 tablet by mouth two times a day  His updated medication list for this problem includes:    Aspir-low 81 Mg Tbec (Aspirin) .[name]... Take 1 tablet by mouth once a day    Furosemide 40 Mg Tabs (Furosemide) .[name]... Take 1 tablet by mouth once a day    Aldactone 25 Mg Tabs (Spironolactone) .[name]... Take 1 tablet by mouth once a day    Lisinopril 10 Mg Tabs (Lisinopril) .[name]... Take 1 tablet by mouth once a day    Coreg 3.125 Mg Tabs (Carvedilol) .[name]... Take 1 tablet by mouth two times a day  Future Orders: T-Basic Metabolic Panel (99991111) ... 11/23/2009  Problem # 3:  HYPERLIPIDEMIA (ICD-272.4) Assessment: Comment Only  Patient Instructions: 1)  Increase Lisinopril to 10mg  daily 2)  Coreg 3.125mg  two times a day  3)  Labs:  BMET, do in 7-10 days 4)  Follow up echo in 3 months prior to next visit 5)  Follow up in  3 months Prescriptions: COREG 3.125 MG TABS (CARVEDILOL) Take 1 tablet by mouth two times a day  #60 x 6   Entered by:   Lovina Reach, [name]   Authorized by:   Terald Sleeper, MD, [facility]   Signed by:   Lovina Reach, [name] on [id number]   Method used:   Electronically to        [facility] (retail)       [address]       [place], [place]  [address]       Ph: [phone]       Fax: [phone]   [id number]   Handout requested. LISINOPRIL 10 MG TABS (LISINOPRIL) Take 1 tablet by mouth once a day  #30 x 6   Entered by:   Lovina Reach, [name]   Authorized by:   Terald Sleeper, MD, [facility]   Signed by:   Lovina Reach, [name] on [id number]   Method used:   Electronically to        [facility] (retail)       [address]       [place], [place]  [address]       Ph: [phone]       Fax: [phone]   RxID:   [id number]

## 2010-04-01 ENCOUNTER — Emergency Department (HOSPITAL_COMMUNITY)
Admission: EM | Admit: 2010-04-01 | Discharge: 2010-04-02 | Disposition: A | Discharge status: Other Acute Inpatient Hospital | Payer: Medicaid Other | Source: Home / Self Care | Attending: Emergency Medicine | Admitting: Emergency Medicine

## 2010-04-01 DIAGNOSIS — F29 Unspecified psychosis not due to a substance or known physiological condition: Secondary | ICD-10-CM | POA: Insufficient documentation

## 2010-04-01 DIAGNOSIS — Z79899 Other long term (current) drug therapy: Secondary | ICD-10-CM | POA: Insufficient documentation

## 2010-04-01 DIAGNOSIS — K219 Gastro-esophageal reflux disease without esophagitis: Secondary | ICD-10-CM | POA: Insufficient documentation

## 2010-04-01 DIAGNOSIS — F22 Delusional disorders: Secondary | ICD-10-CM | POA: Insufficient documentation

## 2010-04-01 DIAGNOSIS — I509 Heart failure, unspecified: Secondary | ICD-10-CM | POA: Insufficient documentation

## 2010-04-01 DIAGNOSIS — E119 Type 2 diabetes mellitus without complications: Secondary | ICD-10-CM | POA: Insufficient documentation

## 2010-04-01 LAB — GLUCOSE, CAPILLARY
Glucose-Capillary: 368 mg/dL — ABNORMAL HIGH (ref 70–99)
Glucose-Capillary: 206 mg/dL — ABNORMAL HIGH (ref 70–99)

## 2010-04-01 LAB — BASIC METABOLIC PANEL
Chloride: 101 mEq/L (ref 96–112)
Creatinine, Ser: 1.02 mg/dL (ref 0.4–1.5)
GFR calc non Af Amer: >60 mL/min (ref >60)
Potassium: 4 mEq/L (ref 3.5–5.1)
Sodium: 135 mEq/L (ref 135–145)

## 2010-04-01 LAB — DIFFERENTIAL
Basophils Relative: 1 % (ref 0–1)
Eosinophils Relative: 2 % (ref 0–5)
Monocytes Relative: 7 % (ref 3–12)
Neutro Abs: 4 10*3/uL (ref 1.7–7.7)
Neutrophils Relative %: 58 % (ref 43–77)

## 2010-04-01 LAB — CBC
HCT: 42.4 % (ref 39.0–52.0)
Hemoglobin: 15.1 g/dL (ref 13.0–17.0)
MCH: 27.9 pg (ref 26.0–34.0)
MCHC: 35.6 g/dL (ref 30.0–36.0)
RDW: 13.4 % (ref 11.5–15.5)
WBC: 6.9 10*3/uL (ref 4.0–10.5)

## 2010-04-01 LAB — RAPID URINE DRUG SCREEN, HOSP PERFORMED
Cocaine: NOT DETECTED
Tetrahydrocannabinol: POSITIVE — AB

## 2010-04-01 LAB — ETHANOL: Alcohol, Ethyl (B): <5 mg/dL (ref 0–10)

## 2010-04-02 ENCOUNTER — Inpatient Hospital Stay (HOSPITAL_COMMUNITY)
Admission: AD | Admit: 2010-04-02 | Discharge: 2010-04-16 | DRG: 885 | Disposition: A | Discharge status: Home / Self Care | Payer: Medicaid Other | Attending: Psychiatry | Admitting: Psychiatry

## 2010-04-02 DIAGNOSIS — F29 Unspecified psychosis not due to a substance or known physiological condition: Principal | ICD-10-CM

## 2010-04-02 DIAGNOSIS — I509 Heart failure, unspecified: Secondary | ICD-10-CM

## 2010-04-02 DIAGNOSIS — F22 Delusional disorders: Secondary | ICD-10-CM

## 2010-04-02 DIAGNOSIS — Z560 Unemployment, unspecified: Secondary | ICD-10-CM

## 2010-04-02 DIAGNOSIS — Z794 Long term (current) use of insulin: Secondary | ICD-10-CM

## 2010-04-02 DIAGNOSIS — I10 Essential (primary) hypertension: Secondary | ICD-10-CM

## 2010-04-02 DIAGNOSIS — F99 Mental disorder, not otherwise specified: Secondary | ICD-10-CM

## 2010-04-02 DIAGNOSIS — Z880 Allergy status to penicillin: Secondary | ICD-10-CM

## 2010-04-02 DIAGNOSIS — I498 Other specified cardiac arrhythmias: Secondary | ICD-10-CM | POA: Diagnosis present

## 2010-04-02 DIAGNOSIS — L738 Other specified follicular disorders: Secondary | ICD-10-CM

## 2010-04-02 DIAGNOSIS — E119 Type 2 diabetes mellitus without complications: Secondary | ICD-10-CM

## 2010-04-02 DIAGNOSIS — I4949 Other premature depolarization: Secondary | ICD-10-CM | POA: Diagnosis present

## 2010-04-02 DIAGNOSIS — Z881 Allergy status to other antibiotic agents status: Secondary | ICD-10-CM

## 2010-04-02 DIAGNOSIS — Z7982 Long term (current) use of aspirin: Secondary | ICD-10-CM

## 2010-04-02 DIAGNOSIS — K219 Gastro-esophageal reflux disease without esophagitis: Secondary | ICD-10-CM

## 2010-04-02 DIAGNOSIS — E781 Pure hyperglyceridemia: Secondary | ICD-10-CM

## 2010-04-02 DIAGNOSIS — F1210 Cannabis abuse, uncomplicated: Secondary | ICD-10-CM

## 2010-04-02 DIAGNOSIS — R9431 Abnormal electrocardiogram [ECG] [EKG]: Secondary | ICD-10-CM | POA: Diagnosis present

## 2010-04-02 LAB — GLUCOSE, CAPILLARY
Glucose-Capillary: 273 mg/dL — ABNORMAL HIGH (ref 70–99)
Glucose-Capillary: 362 mg/dL — ABNORMAL HIGH (ref 70–99)

## 2010-04-03 DIAGNOSIS — F1210 Cannabis abuse, uncomplicated: Secondary | ICD-10-CM

## 2010-04-03 DIAGNOSIS — F29 Unspecified psychosis not due to a substance or known physiological condition: Secondary | ICD-10-CM

## 2010-04-03 LAB — GLUCOSE, CAPILLARY
Glucose-Capillary: 438 mg/dL — ABNORMAL HIGH (ref 70–99)
Glucose-Capillary: 335 mg/dL — ABNORMAL HIGH (ref 70–99)

## 2010-04-04 LAB — GLUCOSE, CAPILLARY
Glucose-Capillary: 254 mg/dL — ABNORMAL HIGH (ref 70–99)
Glucose-Capillary: 205 mg/dL — ABNORMAL HIGH (ref 70–99)
Glucose-Capillary: 279 mg/dL — ABNORMAL HIGH (ref 70–99)
Glucose-Capillary: 314 mg/dL — ABNORMAL HIGH (ref 70–99)

## 2010-04-04 NOTE — Consult Note (Signed)
[name], Mendoza               ACCOUNT NO.:  [id number]  MEDICAL RECORD NO.:  [id number]           PATIENT TYPE:  E  LOCATION:  [facility]                          [facility]ACILITY[facility]  PHYSICIAN:  Marlou Sa, MD DATE OF BIRTH:  August 25, 1955  DATE OF CONSULTATION:  04/02/2010 DATE OF DISCHARGE:                                CONSULTATION   REASON FOR CONSULTATION:  Paranoid behavior.  HISTORY OF PRESENT ILLNESS:  A 55 year old [place] American male who is on petition by Dr. Kenton Kingfisher.  The patient at this time is paranoid.  He thinks that people are following him, watching him all the time.  He told me a [place] guy came at the back of the house and watching through the window.  He also told me that in June last year some unknown people were following him and since then he always thing that somebody is following him.  On asking whether he thinks it is his mind thing he told me no it is not his mind.  He is not crazy.  People are following him. The patient denies any hearing voices.  He is not internally preoccupied.  He is very logical and goal directed.  The patient denied any violent behavior or denied any threat or violet behavior towards self or others.  On asking like what he will do to the people who are following, he told me that he do not have any weapon at home.  He do not know what he can do and how he can harm them.  The patient denied any suicidal ideations at this time.  PAST PSYCH HISTORY: 1. The patient denied any psych hospitalization or any suicide attempt     in the past. 2. He is not on any psych medications.  SUBSTANCE ABUSE HISTORY:  The patient uses marijuana on and off.  He told me he uses 1-2 times marijuana per month but denies use of any other drugs or alcohol.  MEDICAL HISTORY:  The patient has a history of diabetes, GERD, hypertension, congestive heart failure.  The patient was able to tell me all his medical meds along with the doses.  ALLERGIES:   Allergic to PENICILLIN and SULFA.  SOCIAL HISTORY:  The patient lives with the brother.  On asking that whether he told the brother about these things, he told me that he did not want to tell as his brother will thing that he is going crazy.  MENTAL STATUS EXAM:  Calm, cooperative during interview.  Fair eye contact.  No abnormal movements noticed.  No psychomotor agitation or retardation present during the interview.  Speech is normal.  Thought process logical and goal directed.  Thought content not suicidal or homicidal.  Paranoid delusion positive.  Thought perception, no audiovisual hallucination reported, not internally preoccupied. Cognition alert, awake, oriented x3.  Memory immediate, recent, remote intact.  Funds of knowledge fair.  Attention and concentration good. Abstraction ability fair.  Insight and judgment fair.  DIAGNOSIS:  AXIS I:  Psychosis NOS, delusional disorder NOS, marijuana abuse. AXIS II:  Deferred. AXIS III:  See medical notes. AXIS IV:  Unspecified. AXIS  V:  [id number].  RECOMMENDATIONS: 1. We will admit the patient to behavioral health for further     observation and the patient agrees with this. 2. I will not start any medication at this time until we get more     collateral information on this patient.     Marlou Sa, MD     SA/MEDQ  D:  04/02/2010  T:  04/02/2010  Job:  [id number]  Electronically Signed by Marlou Sa  on 04/04/2010 02:00:00 PM

## 2010-04-05 LAB — GLUCOSE, CAPILLARY
Glucose-Capillary: 281 mg/dL — ABNORMAL HIGH (ref 70–99)
Glucose-Capillary: 475 mg/dL — ABNORMAL HIGH (ref 70–99)

## 2010-04-05 NOTE — H&P (Signed)
Brad Mendoza, Brad Mendoza               ACCOUNT NO.:  [id number]  MEDICAL RECORD NO.:  [id number]           PATIENT TYPE:  I  LOCATION:  0301                          [facility]ACILITY:  [facility]  PHYSICIAN:  Norm Salt, MD  DATE OF BIRTH:  1955-05-17  DATE OF ADMISSION:  04/02/2010 DATE OF DISCHARGE:                      PSYCHIATRIC ADMISSION ASSESSMENT   This is an involuntary admission to the services of Dr. Waymon Amato. This is a 55 year old separated African American male.  The commitment papers indicate that he is paranoid delusional.  He cannot care for himself.  He is endorsing homicidal ideation towards others as well. Today he states that he had not had sleep in several months, at least not good sleep.  He became absolutely scared for his life when he thought he saw people who had threatened him last summer near his brother's house where he is staying up in [place].  He states that he went to the sheriff's department.  They said there was nothing they could do.  He reports that "yesterday people could have gotten hurt. There was a guy parked behind his trailer.  He kept watching me.  There was a daycare in church right below me.  I saw the lady talk to the guy for a while before he pulled off.  I think he had a gun.  I think it is gang related.  I don't want nobody to get hurt.  I got a lady who brought me over here today.  I'm scared for her.  There are people all up and down the neighborhood".  Hence he was committed.  PAST PSYCHIATRIC HISTORY:  He denies any prior psychiatric care.  SOCIAL HISTORY:  He went to the 4th grade.  He has been married once, he is separated.  He has 3 children, 2 daughters ages 60, 97 and a son 65. He is currently living with his brother in [place].  He has no regular income.  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  He has applied for disability.  He reports he has been denied  twice and it is under a lawyer's care at this point.  FAMILY HISTORY:  He denies any family history for psychiatric illness.  ALCOHOL AND DRUG HISTORY:  He has used drugs, marijuana since he was a teenager.  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  PRIMARY CARE PROVIDER:  He sees a Dr. Redge Gainer up in [place] and he just saw Dr. Kenton Kingfisher for the first time yesterday.  MEDICAL PROBLEMS:  He was diagnosed with diabetes in 2005.  He is known to have reflux, hypertension; he has had congestive heart failure.  He is status post cardiac cath x2.  MEDICATIONS: 1. He is currently prescribed furosemide 40 mg p.o. daily. 2. Aspirin 81 mg p.o. daily. 3. Carvedilol 3.125 mg p.o. b.i.d. 4. Nexium 40 mg p.o. daily. 5. NovoLog 20 units subcu in the a.m. 6. NovoLog 20 units subcu at noon. 7. NovoLog 30 units subcu  at h.s.  DRUG ALLERGIES:  Are to PENICILLIN and SULFA.  He states it has been so long ago he really cannot remember what the side effects were.  He just has not taken them again.  POSITIVE PHYSICAL FINDINGS:  He was medically cleared in the ED at [facility].  He was afebrile.  His temperature ranged from 97.3 to 98.4. Pulse was 62 to 88, respirations were 14 to 20 and blood pressure was 111/69 to 136/83.  His glucose was elevated at 286.  His UDS was positive for THC.  His glucose ranged from 426 to 206 while in the emergency room.  He had no other remarkable physical findings.  MENTAL STATUS EXAM:  He was seen in the emergency department by Dr. Sherlynn Stalls.  His mental status exam showed that while he was here he was calm, cooperative.  He had fair eye contact.  He had no abnormal motor. He was not agitated.  His speech was normal.  His thoughts were logical and goal-directed.  Thought contact was not suicidal or homicidal.  He did endorse being paranoid.  He had no auditory or visual  hallucinations reported.  He was not internally preoccupied.  He was alert and oriented.  He had recall and his fund of knowledge was fair.  DIAGNOSES:  AXIS I:  Felt to be psychosis not otherwise specified, delusional disorder not otherwise specified, marijuana abuse. AXIS II:  Deferred. AXIS III:  He has a history for diabetes, gastroesophageal reflux disease, hypertension and congestive heart failure.  He has had 2 cardiac caths. AXIS IV:  Severe.  He has no income. AXIS V:  30.  PLAN:  To admit for safety and stabilization.  As his immediate issue was poor sleep for the past 2 months he was started on Seroquel 50 mg at h.s.  He is asking today for help with post discharge placement.  He is fearful that these people will hurt his family.  Estimated length of stay is 3 to 5 days.     Mickie Kerry Dory, P.A.-C.   ______________________________ Norm Salt, MD    MD/MEDQ  D:  04/03/2010  T:  04/03/2010  Job:  [id number]  Electronically Signed by Emiliano Dyer ADAMS P.A.-C. on 04/03/2010 12:40:20 PM Electronically Signed by Waymon Amato MD on 04/05/2010 11:46:00 AM

## 2010-04-06 LAB — LIPID PANEL
Cholesterol: 188 mg/dL (ref 0–200)
HDL: 41 mg/dL (ref >39)
LDL Cholesterol: 74 mg/dL (ref 0–99)
Total CHOL/HDL Ratio: 4.6 RATIO
Triglycerides: 364 mg/dL — ABNORMAL HIGH (ref <150)

## 2010-04-06 LAB — VITAMIN B12: Vitamin B-12: 758 pg/mL (ref 211–911)

## 2010-04-06 LAB — GLUCOSE, CAPILLARY
Glucose-Capillary: 228 mg/dL — ABNORMAL HIGH (ref 70–99)
Glucose-Capillary: 309 mg/dL — ABNORMAL HIGH (ref 70–99)

## 2010-04-06 LAB — FOLATE: Folate: >20 ng/mL

## 2010-04-06 LAB — TSH: TSH: 2.188 u[IU]/mL (ref 0.350–4.500)

## 2010-04-07 LAB — GLUCOSE, CAPILLARY
Glucose-Capillary: 248 mg/dL — ABNORMAL HIGH (ref 70–99)
Glucose-Capillary: 320 mg/dL — ABNORMAL HIGH (ref 70–99)

## 2010-04-07 NOTE — Consult Note (Signed)
NAME:  Brad Mendoza, Brad Mendoza               ACCOUNT NO.:  [id number]  MEDICAL RECORD NO.:  [id number]           PATIENT TYPE:  I  LOCATION:  0501                          [facility]ACILITY:  [facility]  PHYSICIAN:  Kieth Brightly, MDDATE OF BIRTH:  [birth year]  DATE OF CONSULTATION:  04/06/2010 DATE OF DISCHARGE:                                CONSULTATION   REFERRING PHYSICIAN:  The consult was requested by the psychiatry team attending on Brad Mendoza, who currently is an inpatient at the [facility].  REASON FOR CONSULTATION:  Increased hemoglobin A1c and blood sugar control.  HISTORY OF PRESENT ILLNESS:  Brad Mendoza is a 55 year old gentleman who was admitted here for psychosis NOS and delusional disorder.  He has a history of diabetes and gastroesophageal reflux disease and hypertension.  He has had 2 cardiac caths so far.  He also has history of marijuana abuse.  He has been found to have slightly elevated blood sugars and he has elevated blood sugars during the hospital stay.  I spoke to the patient and he says that his sugars are pretty okay when he is home and when he gets admitted, the sugars are pretty up.  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  He says sometimes the blood sugar in the afternoon is pretty low and he even take insulin injections at that time.  He said get he was taking metformin and that caused his diarrhea, and so he stopped it.  He says that he takes insulin injections regularly.  I spoke to him about the effects of insulin and body weight and other medications which alter the effect of insulin and he exhibited understanding about this. No diarrhea.  No nausea, vomiting or chest pain.  No shortness of breath.  PAST HISTORY: 1. Diabetes mellitus. 2. Hypertension. 3. Acid reflux disease. 4. Rare history of gastroesophageal reflux disease. 5. Prior history of nonobstructive  coronary artery disease with 40% to     50% of LAD and with a global hypokinesis with heart failure as per     cardiac cath report of 2005.  CURRENT MEDICATIONS:  I believe aspirin, Coreg, fluconazole, Lasix, Protonix, NPH 40 units q.a.c. and NovoLog sliding scale with the meal coverage.  FAMILY HISTORY:  No psych issues but no other medical issues in family.  REVIEW OF SYSTEMS:  A 12-point system review unremarkable.  Positive pertinent features mentioned in the history of presenting illness or otherwise negative.  VITAL SIGNS:  Temperature 97.2, heart rate 54, respiratory rate is 16 and blood pressure 146/54. GENERAL:  The patient awake, alert, well-oriented, ambulating in the hallway. HEAD AND NECK:  Thick neck.  No JVD, no bruit. CHEST:  Bilateral air entry is good anteriorly and posteriorly.  No rales or wheezing. CARDIAC:  S1 and S2 were regular.  No murmurs. ABDOMEN:  Soft, nontender, no organomegaly. EXTREMITIES:  No pedal edema.  REVIEW OF LABS:  Lipid profile of total cholesterol 188, triglyceride 364, HDL 41, LDL 74 and VLDL 73.  Hemoglobin A1c is 11.1.  Blood sugar  measurements show a blood sugar today of 175, 211, 270 and 257 and 228.  ASSESSMENT/PLAN: 1. Diabetes mellitus type 2. 2. Dyslipidemia. 3. Nonobstructive coronary disease and previously-mentioned as heart     failure with global hypokinesis with congestive heart failure as     per history. 4. Delusions, paranoid ideation, with psychosis not otherwise     specified.  PLAN:  Diabetes:  The patient is currently on NPH of 40 units once daily.  I would split this in a way to make it 30 units in the morning and 15 units in the evening.  And when doing so, we would skip the meal coverage for morning and evening since it is a 70/30 solution and we will give meal coverage only for afternoon and at night.  Considering the fact that he has increased lipid levels, I would add Actos as a stabilizer for the  insulin.  Since the patient cannot take metformin due to diarrhea, Actos will be a good choice if his insurance covers.  The only issue will be any insurance issues with regard to that, which needs to be taken care of at the time of discharge.  I coincidentally observed that the patient does have hypertriglyceridemia in the presence of diabetes and hypertension and coronary artery disease.  With that levels I would suggest starting the patient on Lipitor but I would leave it to the psychiatrist for starting that after reviewing the interactions of Lipitor with other medications which the patient is on.  A total of 50 minutes spent on the consult.  We thank you very much for allowing [place] to take part in the care of this patient.  The [facility] team will follow for another few days to see the trend of the blood sugars.     Kieth Brightly, MD     UT/MEDQ  D:  04/06/2010  T:  04/06/2010  Job:  [id number]  cc:   Marlou Sa, MD  Norm Salt, MD  Chipper Herb, M.D. Fax: [phone]  Electronically Signed by Kieth Brightly MD on 04/07/2010 03:15:54 PM

## 2010-04-08 ENCOUNTER — Ambulatory Visit (HOSPITAL_COMMUNITY): Payer: Medicaid Other

## 2010-04-08 LAB — GLUCOSE, CAPILLARY: Glucose-Capillary: 269 mg/dL — ABNORMAL HIGH (ref 70–99)

## 2010-04-09 ENCOUNTER — Other Ambulatory Visit (HOSPITAL_COMMUNITY): Payer: Medicaid Other

## 2010-04-09 LAB — GLUCOSE, CAPILLARY
Glucose-Capillary: 322 mg/dL — ABNORMAL HIGH (ref 70–99)
Glucose-Capillary: 165 mg/dL — ABNORMAL HIGH (ref 70–99)

## 2010-04-10 ENCOUNTER — Ambulatory Visit (HOSPITAL_COMMUNITY): DRG: 885 | Payer: Medicaid Other | Attending: Psychiatry

## 2010-04-10 DIAGNOSIS — F29 Unspecified psychosis not due to a substance or known physiological condition: Secondary | ICD-10-CM | POA: Insufficient documentation

## 2010-04-11 LAB — GLUCOSE, CAPILLARY
Glucose-Capillary: 100 mg/dL — ABNORMAL HIGH (ref 70–99)
Glucose-Capillary: 161 mg/dL — ABNORMAL HIGH (ref 70–99)
Glucose-Capillary: 280 mg/dL — ABNORMAL HIGH (ref 70–99)

## 2010-04-12 LAB — GLUCOSE, CAPILLARY: Glucose-Capillary: 211 mg/dL — ABNORMAL HIGH (ref 70–99)

## 2010-04-13 ENCOUNTER — Ambulatory Visit (HOSPITAL_COMMUNITY)
Admit: 2010-04-13 | Discharge: 2010-04-13 | Disposition: A | Discharge status: Home / Self Care | Payer: Medicaid Other | Attending: Psychiatry | Admitting: Psychiatry

## 2010-04-13 DIAGNOSIS — I4949 Other premature depolarization: Secondary | ICD-10-CM | POA: Insufficient documentation

## 2010-04-13 DIAGNOSIS — R404 Transient alteration of awareness: Secondary | ICD-10-CM

## 2010-04-13 DIAGNOSIS — R9431 Abnormal electrocardiogram [ECG] [EKG]: Secondary | ICD-10-CM | POA: Insufficient documentation

## 2010-04-13 DIAGNOSIS — I498 Other specified cardiac arrhythmias: Secondary | ICD-10-CM | POA: Insufficient documentation

## 2010-04-13 LAB — GLUCOSE, CAPILLARY: Glucose-Capillary: 208 mg/dL — ABNORMAL HIGH (ref 70–99)

## 2010-04-14 LAB — CK TOTAL AND CKMB (NOT AT ARMC)
CK, MB: 6.8 ng/mL (ref 0.3–4.0)
Relative Index: 1.4 (ref 0.0–2.5)
Total CK: 469 U/L — ABNORMAL HIGH (ref 7–232)

## 2010-04-14 LAB — BASIC METABOLIC PANEL
CO2: 26 mEq/L (ref 19–32)
Calcium: 8.9 mg/dL (ref 8.4–10.5)
Creatinine, Ser: 0.98 mg/dL (ref 0.4–1.5)
Glucose, Bld: 163 mg/dL — ABNORMAL HIGH (ref 70–99)
Sodium: 139 mEq/L (ref 135–145)

## 2010-04-14 LAB — GLUCOSE, CAPILLARY: Glucose-Capillary: 154 mg/dL — ABNORMAL HIGH (ref 70–99)

## 2010-04-15 ENCOUNTER — Ambulatory Visit (HOSPITAL_COMMUNITY)
Admission: RE | Admit: 2010-04-15 | Discharge: 2010-04-15 | Disposition: A | Discharge status: Home / Self Care | Payer: Medicaid Other | Source: Ambulatory Visit | Attending: Psychiatry | Admitting: Psychiatry

## 2010-04-15 DIAGNOSIS — I517 Cardiomegaly: Secondary | ICD-10-CM

## 2010-04-15 LAB — GLUCOSE, CAPILLARY

## 2010-04-16 ENCOUNTER — Encounter: Payer: Self-pay | Admitting: Cardiology

## 2010-04-16 LAB — GLUCOSE, CAPILLARY
Glucose-Capillary: 224 mg/dL — ABNORMAL HIGH (ref 70–99)
Glucose-Capillary: 166 mg/dL — ABNORMAL HIGH (ref 70–99)

## 2010-04-19 NOTE — Discharge Summary (Signed)
Brad Mendoza, Brad Mendoza               ACCOUNT NO.:  [id number]  MEDICAL RECORD NO.:  [id number]           PATIENT TYPE:  I  LOCATION:  0508                          [facility]ACILITY:  [facility]  PHYSICIAN:  [name], DO        DATE OF BIRTH:  [birth year]  DATE OF ADMISSION:  04/02/2010 DATE OF DISCHARGE:  04/16/2010                              DISCHARGE SUMMARY   REASON FOR HOSPITALIZATION:  This is a 55 year old male admitted due to paranoid delusional thinking. He was unable to care for himself. Endorsing homicidal ideation towards others as well.  Had not slept in several months. He was feeling scared for his life when he thought he saw people who had threatened him before.  FINAL DIAGNOSES:  AXIS I:  Psychosis, not otherwise specified; delusional disorder, not otherwise specified; marijuana abuse. AXIS II: Deferred. AXIS III: History of diabetes, gastroesophageal reflux disease, hypertension, congestive heart failure. History of two cardiac catheterizations. AXIS IV:  Severe. Financial and medical issues. AXIS V: 50-55.  LABORATORY DATA:  Alcohol level was less than 5.  His CBC was within normal limits.  Urine drug screen positive for cannabis. Blood sugar remained elevated in the 300 to 400 range.  SIGNIFICANT FINDINGS:  The patient was well-nourished, well-developed, calm and cooperative with fair eye contact.  No abnormal motor behavior. Not agitated.  Speech was normal.  Thoughts are logical and goal directed.  Did not appear to be overtly psychotic.  He was not hypervigilant.  He did endorse being paranoid.  He denied any auditory or visual hallucinations and did not appear to be internally preoccupied.  Fully alert and oriented.  His recall and fund of knowledge were fair. The patient's blood sugar remained elevated, we made adjustments in his insulin dosing.  We also had an Internal Medicine consultation and the fact that the patient was having an irregular rhythm.  He still  complained of having death threats and was isolating himself from people but not knowing who these people were. We contacted the patient's wife to educate her on suicide and address any other concerns.  The patient slowly improved, was still having some difficulty sleeping.  He did, however, deny any suicidal or homicidal thoughts or hallucinations.  We increased the patient's Abilify to 10 mg to lessen his psychotic symptoms.  The patient had a CAT scan of the brain with and without contrast as well as a carotid ultrasound.  His CAT scan was normal.  His blood sugar began to improve.  He denied any hallucinations and again denied any suicidal or homicidal thoughts.  We continued his Abilify, and were awaiting his living situation.  His carotid ultrasound preliminary report showed no significant plaque.  CONDITION ON DISCHARGE:  The patient was fully alert and cooperative, anxious to go home, feeling much better, tolerating his medications, realizing he needed to follow up with his medical issues and abstain from any substance use.  He was in full contact with reality, did not appear overtly psychotic and no signs of mania or hypomania.  DISCHARGE MEDICATIONS:  Abilify 50 mg daily at bedtime, insulin 50 units  subcu at breakfast, insulin 20 units subcu at supper time, lisinopril 5 mg daily, Actos 30 mg daily, Crestor 40 mg daily, Ambien 10 mg q.h.s., aspirin 81 mg daily, Coreg 3.125 mg one tablet b.i.d., furosemide 40 mg daily, Nexium 1 capsule daily.  FOLLOWUP:  His follow-up appointments were at [facility] on February 27th at 8:00 a.m. He has an appointment with Dr. Laurance Flatten, his primary care provider on March 2nd and with Dr. Edilia Bo, Cardiology at [facility] on March 6th at 3:15.  The patient was advised to bring all of his medications and his identification to these appointments.     Redgie Grayer, N.P.   ______________________________ [name], DO    JO/MEDQ  D:   04/16/2010  T:  04/17/2010  Job:  [id number]  Electronically Signed by Arnetha CourserP. on 04/19/2010 10:15:45 AM Electronically Signed by [name] MD on 04/19/2010 08:10:01 PM

## 2010-04-27 ENCOUNTER — Encounter: Payer: Medicaid Other | Admitting: Cardiology

## 2010-05-04 NOTE — Consult Note (Signed)
Summary: CARDIOLOGY CONSULT/ [facility]/ [facility]   Imported By: [name] 04/27/2010 12:35:24  _____________________________________________________________________  External Attachment:    Type:   Image     Comment:   External Document

## 2010-05-04 NOTE — Letter (Signed)
Summary: [facility] D/C   [facility] D/C   Imported By: [name] 04/27/2010 12:31:46  _____________________________________________________________________  External Attachment:    Type:   Image     Comment:   External Document

## 2010-05-04 NOTE — Cardiovascular Report (Signed)
Summary: Cardiac Cath Other  Cardiac Cath Other   Imported By: [name] 04/27/2010 12:54:12  _____________________________________________________________________  External Attachment:    Type:   Image     Comment:   External Document

## 2010-05-04 NOTE — Consult Note (Signed)
Summary: CARDIOLOGY CONSULT/ [facility]/ [facility]   Imported By: [name] 04/27/2010 12:28:46  _____________________________________________________________________  External Attachment:    Type:   Image     Comment:   External Document

## 2010-05-04 NOTE — Progress Notes (Signed)
Summary: Office Visit  Office Visit   Imported By: [name] 04/27/2010 12:28:16  _____________________________________________________________________  External Attachment:    Type:   Image     Comment:   External Document

## 2010-05-18 DIAGNOSIS — I509 Heart failure, unspecified: Secondary | ICD-10-CM

## 2010-05-18 DIAGNOSIS — B351 Tinea unguium: Secondary | ICD-10-CM

## 2010-05-18 DIAGNOSIS — E11319 Type 2 diabetes mellitus with unspecified diabetic retinopathy without macular edema: Secondary | ICD-10-CM | POA: Insufficient documentation

## 2010-05-18 DIAGNOSIS — Z95810 Presence of automatic (implantable) cardiac defibrillator: Secondary | ICD-10-CM | POA: Insufficient documentation

## 2010-05-18 DIAGNOSIS — K219 Gastro-esophageal reflux disease without esophagitis: Secondary | ICD-10-CM

## 2010-05-18 DIAGNOSIS — I428 Other cardiomyopathies: Secondary | ICD-10-CM | POA: Insufficient documentation

## 2010-05-18 DIAGNOSIS — I10 Essential (primary) hypertension: Secondary | ICD-10-CM

## 2010-07-06 NOTE — Consult Note (Signed)
NAME:  Brad Mendoza, Brad Mendoza               ACCOUNT NO.:  [id number]   MEDICAL RECORD NO.:  [id number]         PATIENT TYPE:  PAMB   LOCATION:  DAY                           FACILITY:  [facility]   PHYSICIAN:  R. Garfield Cornea, M.D. DATE OF BIRTH:  [birth year]   DATE OF CONSULTATION:  DATE OF DISCHARGE:                                 CONSULTATION   REFERRING PHYSICIAN:  Dr. Rory Percy with [facility]  Department   REASON FOR CONSULTATION:  Rectal discomfort.   Brad Mendoza is a pleasant 55 year old African American male sent  here at the courtesy of the staff at [facility] Department  to further evaluate significant history of intermittent rectal  pressure/discomfort.  He notes rectal discomfort pressure sensation when  he lies down to go to bed or to take a nap.  He really does not have any  symptoms sitting up, walking, or anything, the problems associated with  having a bowel movement.  He states he has a bowel movement everyday.  He has not had any pain with defecation.  Has not had any rectal  bleeding or melena.  No associated abdominal pain.  He tells me Dr.  __________performed a colonoscopy on him back in the 1980s for reasons  he cannot recall.  There were no significant findings.  There is no  family history of polyps or colorectal neoplasia.  He does have type 2  diabetes mellitus, now on insulin and hypertension.  He has longstanding  intermittent reflux symptoms where he has taken Nexium 40 mg orally  daily because of headache.  That medication is being switched out per  the Health Department as Mr. Pinkett reports.   PAST MEDICAL HISTORY:  Significant for type 2 diabetes mellitus,  hypertension, and chronic back pain.   PAST SURGERIES:  Appendectomy, cardiac catheterization with negative  findings.   CURRENT MEDICATIONS:  Metformin 1 g b.i.d., Novolin 20 units in the  morning and 40 units in the evening, antihypertensive medications,  Nexium  40 mg orally daily.   ALLERGIES:  PENICILLIN and SULFA.   FAMILY HISTORY:  Father died in his 6s with a CVA, mother is at age 74  with good health.   SOCIAL HISTORY:  The patient is married.  Has 2 grandchildren, ages 88  and 33.  He is currently unemployed.  He does not smoke.  He does not  use  alcohol.  Occasionally, he has used crack cocaine.  He also broke  up with his wife, but had a reconciliation.  He did use crack once 3  weeks ago, but denies any use since that time and nine 8 years up until  that one.   REVIEW OF SYSTEMS:  No odynophagia, dysphagia, early satiety, nausea, or  vomiting.  No change in weight.  No melena or hematochezia.  Apparently,  he has had no abdominal pain.  No night sweats, fever, chills, dyspnea  on exertion, or chest pain.   PHYSICAL EXAMINATION:  GENERAL:  A pleasant 55 year old male resting  comfortably.  VITAL SIGNS:  Weight 237, height 5 feet 9 inches,  temp 97, BP 130/78,  and pulse 72.  SKIN:  Warm and dry.  There is no jaundice or cutaneous stigmata of  chronic liver disease.  HEENT:  No scleral icterus.  CHEST:  Lungs are clear to auscultation.  HEART:  Regular rate and rhythm without murmur, gallop, or rub.  ABDOMEN:  Distended.  Soft and nontender without appreciable mass or  organomegaly.  I do not appreciate any inguinal hernias.  EXTREMITY:  No edema.  RECTAL:  Good sphincter tone.  No mass in the rectal vault.  Rectal exam  is minimally uncomfortable for [name].  No mass in the rectal vault.  No stool in the rectal vault.  Mucus Hemoccult negative.   IMPRESSION:  Brad Mendoza. Brad Mendoza is a pleasant 56 year old African  American male with fairly unusual symptoms of rectal pressure when he  lies supine.  He is essentially devoid of any the other gastrointestinal  symptoms aside from occasional reflux, which is well controlled on  Nexium.  It has been a long term since he had his lower gastrointestinal  tract image.  I told Mr.  Un before we do anything else, we will just  need to plan to perform a diagnostic/screening colonoscopy in the near  future.  Risk, benefits, alternatives, and limitations have reviewed and  questions were answered.  I have asked him please not to use any more  illicit drugs and he states he is doing much better since he had  reconciliation with his wife.  I will plan to perform a colonoscopy in  the very near future at [facility].  Further recommendations to  follow.   I would like to thank Dr. Nadara Mustard and the staff at [facility] Department for allowing me to see this nice gentleman today.      Bridgette Habermann, M.D.  Electronically Signed     RMR/MEDQ  D:  02/07/2008  T:  02/08/2008  Job:  [id number]   cc:   [facility] Department

## 2010-07-06 NOTE — Op Note (Signed)
NAME:  Brad Mendoza, Brad Mendoza               ACCOUNT NO.:  [id number]   MEDICAL RECORD NO.:  [id number]          PATIENT TYPE:  AMB   LOCATION:  DAY                           FACILITY:  [facility]   PHYSICIAN:  R. Garfield Cornea, M.D. DATE OF BIRTH:  [birth year]   DATE OF PROCEDURE:  03/14/2008  DATE OF DISCHARGE:                               OPERATIVE REPORT   PROCEDURE:  Screening/diagnostic colonoscopy.   INDICATIONS FOR PROCEDURE:  A 55 year old [place] American male with  intermittent rectal pressure.  He denies rectal pain or burning.  He has  not had any rectal bleeds any whatsoever.  He has 1-2 bowel movements  daily.  He denies straining.  I saw him on February 07, 2008, with the  symptoms have been present for some time.  He has more prominent  symptoms when he is supine, taking a nap/sleeping.   Really does not have much of the symptoms sitting up, walking, or any  other activity.  No family history of colorectal neoplasia.  Never has  lower GI tract evaluated.  Colonoscopy is now being done.  Risks,  benefits, alternatives, and limitations have been reviewed.  Please see  the documentation in the medical record.  Please note, he almost has no  symptoms until February 07, 2008, but yesterday during the prep, he had  some recurrence and pressure sensation, he has been experiencing.  He  denies any prolapse symptoms.   PROCEDURE NOTE:  O2 saturation, blood pressure, pulse, and respirations  were monitored throughout the entire procedure.   CONSCIOUS SEDATION:  Versed 3 mg IV and Demerol 50 mg IV in divided  doses.   INSTRUMENT:  Pentax video chip system.   Digital rectal exam revealed no abnormalities.  Endoscopic findings:  The prep was good.  Colon:  Colonic mucosa was surveyed from the  rectosigmoid junction through the left transverse right colon to the  appendiceal orifice, ileocecal valve, and cecum.  These structures were  well seen and photographed for the record.  From this  level, scope was  slowly and cautiously withdrawn.  All previously mentioned mucosal  surfaces were again seen.  The patient had pancolonic diverticulum and  colonic mucosa appeared entirely normal.  The scope was pulled down the  rectum.  Careful examination of the rectal mucosa including retroflexion  anal verge demonstrated only anal canal internal hemorrhoids.  The  patient tolerated the procedure well and was reactivated in Endoscopy.   IMPRESSION:  Anal canal internal hemorrhoids, otherwise normal rectum,  pancolonic diverticulum, and colonic mucosa appeared normal.   RECOMMENDATIONS:  1. A 10-day course of Anusol-HC Suppository one per rectum nightly.  2. Begin daily fiber supplements with Benefiber 1 tablespoon daily      indefinitely.  3. Consider repeat screening colonoscopy in 10 years.  4. If his symptoms of pressure did not resolve with these maneuvers,      he is to let me know.      Bridgette Habermann, M.D.  Electronically Signed     RMR/MEDQ  D:  03/14/2008  T:  03/14/2008  Job:  [id number]  cc:   Rory Percy  Fax: [phone]

## 2010-07-09 NOTE — Cardiovascular Report (Signed)
NAME:  Brad Mendoza, Brad Mendoza                         ACCOUNT NO.:  [id number]   MEDICAL RECORD NO.:  [id number]                   PATIENT TYPE:  OIB   LOCATION:  6501                                 FACILITY:  [facility]   PHYSICIAN:  Loretha Brasil. Lia Foyer, M.D. [facility]         DATE OF BIRTH:  December 14, 1955   DATE OF PROCEDURE:  08/29/2003  DATE OF DISCHARGE:  08/29/2003                              CARDIAC CATHETERIZATION   PROCEDURES PERFORMED:  1. Left heart catheterization.  2. Selective coronary arteriography.  3. Selective left ventriculography.   CARDIOLOGIST:  Loretha Brasil. Lia Foyer, M.D.   INDICATIONS:  [name] is a 55 year old gentleman who has had some  intermittent chest discomfort.  The patient had an abnormal perfusion scan  showing evidence of scar and ischemia in the inferior and lateral  distributions.  He had some shortness of breath in recovery, but no ST-T  wave changes.  The overall ejection fraction was reduced to 44%.  He did not  have chest pain on the treadmill.  He has had significant hypertension,  hyperlipidemia and diabetes; and, was referred for further evaluation.   DESCRIPTION OF PROCEDURE:  The procedure was performed from the right  femoral artery using 4 French catheters.  The artery was entered with an  anterior puncture.  The patient experienced no complications.  The patient  was taken to the holding area in the satisfactory clinical condition.   HEMODYNAMIC DATA:  1. Central aorta 126/81, mean 105.  2. Left ventricle 128/19.  3. No gradient on pullback across the aortic valve.   ANGIOGRAPHIC DATA:  1. Ventriculography:  Ventriculography was performed in the RAO projection.     There appeared to be mild global hypokinesis.  Ejection fraction is     pending at the time of this dictation.   1. Left Main Coronary Artery:  The left main coronary artery is free of     critical disease.   1. Left Anterior Descending Artery:  The left anterior descending artery  courses to the apex.  In the midportion of the vessel was a segmental     area of plaquing of approximately 40-50%.  Intracoronary nitroglycerin     was given.  The lesion itself did not appear to be critical, but the     hemodynamic significance of any given lesion cannot be excluded.  The     remainder of the LAD was without critical narrowing.  There was minimal     irregularity of the ostium of the diagonal.   1. Circumflex:  The circumflex provided a large marginal branch and modest     size AV circumflex.  This vessel appeared to be free of critical disease.   1. Right Coronary Artery:  The right coronary artery provided the posterior     descending and two posterolateral branches, both of which appeared to be     free of critical disease.   CONCLUSION:  1.  Mild global hypokinesis, question secondary to hypertension.  2. Forty to fifty per narrowing in the mid left anterior descending artery     as noted in the above text.  3. No significant coronary obstruction of the circumflex or right coronary     arteries.   DISPOSITION:  The patient will need follow up with both Dr. Moshe Cipro and Dr.  Wilhemina Cash.  The patient's initial laboratory studies suggested a hemoglobin of  12.1, hematocrit of 36.0 and an MCV of 78.9.  The patient may require  further evaluation.  With regard to his coronary disease he needs aggressive  risk factor reduction.  He also will need control of his hyperglycemia as  well as his hypertension.   The patient will be discharged later today for follow up.                                               Loretha Brasil. Lia Foyer, M.D. [facility]    TDS/MEDQ  D:  08/29/2003  T:  08/29/2003  Job:  [id number]   cc:   Norwood Levo. Moshe Cipro, M.D.  [address]  [place], [place] [address]  Fax: [phone]   Scarlett Presto, M.D.  Fax: [phone]

## 2010-07-09 NOTE — Procedures (Signed)
NAME:  Brad Mendoza, Brad Mendoza                         ACCOUNT NO.:  [id number]   MEDICAL RECORD NO.:  [id number]                   PATIENT TYPE:  OUT   LOCATION:  RAD                                  FACILITY:  [facility]   PHYSICIAN:  Jacqulyn Ducking, M.D.               DATE OF BIRTH:  [birth year]   DATE OF PROCEDURE:  07/31/2003  DATE OF DISCHARGE:                                  ECHOCARDIOGRAM   REFERRING PHYSICIAN:  Norwood Levo. Moshe Cipro, M.D. and Scarlett Presto, M.D.   CLINICAL DATA:  A 55 year old gentleman with chest pain, hypertension, and  diabetes.   M-MODE:  Aorta 2.9, left atrium 4.5, septum 1.3, posterior wall 11, LV  diastole 5.1, LV systole 3.9.   1. Technically adequate echocardiographic study.  2. Mild left and right atrial enlargement.  Normal right ventricular size     and function.  3. Normal Mitral valve; mild annular calcification.  4. Very mild aortic sclerosis; normal valve function.  5. Normal tricuspid valve; trivial regurgitation; mild elevation in     estimated RV systolic pressure.  6. Normal pulmonic valve.  7. Normal internal dimension of the left ventricle and normal wall     thickness; abnormal pattern of septal motion; posterolateral segment     appears somewhat hypokinetic.  Overall LV systolic function is normal.      ___________________________________________                                            Jacqulyn Ducking, M.D.   RR/MEDQ  D:  07/31/2003  T:  07/31/2003  Job:  [id number]   cc:   Norwood Levo. Moshe Cipro, M.D.  [address]  [place],  [address]  Fax: [phone]   Scarlett Presto, M.D.  Fax: [phone]

## 2010-08-19 ENCOUNTER — Encounter (INDEPENDENT_AMBULATORY_CARE_PROVIDER_SITE_OTHER): Payer: Self-pay | Admitting: General Surgery

## 2010-08-19 ENCOUNTER — Ambulatory Visit (INDEPENDENT_AMBULATORY_CARE_PROVIDER_SITE_OTHER): Payer: Medicaid Other | Admitting: General Surgery

## 2010-08-19 VITALS — BP 122/80 | HR 60 | Ht 69.0 in | Wt 239.6 lb

## 2010-08-19 DIAGNOSIS — IMO0001 Reserved for inherently not codable concepts without codable children: Secondary | ICD-10-CM

## 2010-08-19 DIAGNOSIS — K458 Other specified abdominal hernia without obstruction or gangrene: Secondary | ICD-10-CM

## 2010-08-19 NOTE — Progress Notes (Signed)
Subjective:     Patient ID: Brad Mendoza, male   DOB: [birth year], 55 y.o.   MRN: [id number]    BP 122/80  Pulse 60  Ht 5\' 9"  (1.753 m)  Wt 239 lb 9.6 oz (108.682 kg)  BMI 35.38 kg/m2    HPI This patient is a 55 year old male who was referred by Lianne Cure of bilateral inguinal hernias. By the patient's report he underwent a colonoscopy several weeks ago and the only abnormal finding they thought they saw were bilateral inguinal hernias. He had no evidence of discomfort swelling bulges in that area he was referred to [place] for evaluation. Review of Systems  Constitutional: Negative.   HENT: Negative.   Gastrointestinal: Negative for abdominal pain and abdominal distention.  Genitourinary: Negative for penile swelling, scrotal swelling and testicular pain.  Hematological: Negative.   Psychiatric/Behavioral: Negative.        Objective:   Physical Exam  Constitutional: He appears well-developed and well-nourished.  Cardiovascular: Normal rate and regular rhythm.   Pulmonary/Chest: Effort normal and breath sounds normal.  Abdominal: Soft. Bowel sounds are normal. He exhibits no mass. There is no splenomegaly or hepatomegaly. There is no tenderness. There is no rebound and no guarding. No hernia. Hernia confirmed negative in the right inguinal area and confirmed negative in the left inguinal area.  Genitourinary: Penis normal.  Psychiatric: He has a normal mood and affect. His behavior is normal. Judgment and thought content normal.       Assessment:    The patient is a 55 year old gentleman who was referred to [place] for evaluation of bilateral inguinal hernias examination today I cannot treat any hernias either in the inguinal area or the abdominal wall or the peri-umbilical area.  I will refer the patient back to his primary care physician for further evaluation.     Plan:    I will refer this patient at his primary care physician for further evaluation. No surgery was  necessary at this time

## 2010-08-19 NOTE — Patient Instructions (Signed)
Return to your primary care physician for further instruction. No surgery is necessary

## 2010-11-02 ENCOUNTER — Emergency Department (HOSPITAL_COMMUNITY)
Admission: EM | Admit: 2010-11-02 | Discharge: 2010-11-02 | Disposition: A | Discharge status: Home / Self Care | Payer: Medicaid Other | Attending: Emergency Medicine | Admitting: Emergency Medicine

## 2010-11-02 DIAGNOSIS — E119 Type 2 diabetes mellitus without complications: Secondary | ICD-10-CM | POA: Insufficient documentation

## 2010-11-02 DIAGNOSIS — I10 Essential (primary) hypertension: Secondary | ICD-10-CM | POA: Insufficient documentation

## 2010-11-02 DIAGNOSIS — Z79899 Other long term (current) drug therapy: Secondary | ICD-10-CM | POA: Insufficient documentation

## 2010-11-02 DIAGNOSIS — I509 Heart failure, unspecified: Secondary | ICD-10-CM | POA: Insufficient documentation

## 2010-11-02 DIAGNOSIS — I498 Other specified cardiac arrhythmias: Secondary | ICD-10-CM | POA: Insufficient documentation

## 2010-11-02 DIAGNOSIS — Z794 Long term (current) use of insulin: Secondary | ICD-10-CM | POA: Insufficient documentation

## 2010-11-02 LAB — GLUCOSE, CAPILLARY: Glucose-Capillary: 463 mg/dL — ABNORMAL HIGH (ref 70–99)

## 2010-12-08 DIAGNOSIS — R072 Precordial pain: Secondary | ICD-10-CM

## 2010-12-14 ENCOUNTER — Encounter: Payer: Medicaid Other | Admitting: Cardiology

## 2010-12-24 ENCOUNTER — Ambulatory Visit (INDEPENDENT_AMBULATORY_CARE_PROVIDER_SITE_OTHER): Payer: Medicaid Other | Admitting: Cardiology

## 2010-12-24 ENCOUNTER — Encounter: Payer: Self-pay | Admitting: Cardiology

## 2010-12-24 VITALS — BP 169/93 | HR 89 | Ht 69.0 in | Wt 237.0 lb

## 2010-12-24 DIAGNOSIS — I428 Other cardiomyopathies: Secondary | ICD-10-CM

## 2010-12-24 DIAGNOSIS — F1910 Other psychoactive substance abuse, uncomplicated: Secondary | ICD-10-CM | POA: Insufficient documentation

## 2010-12-24 DIAGNOSIS — J40 Bronchitis, not specified as acute or chronic: Secondary | ICD-10-CM | POA: Insufficient documentation

## 2010-12-24 DIAGNOSIS — F17200 Nicotine dependence, unspecified, uncomplicated: Secondary | ICD-10-CM

## 2010-12-24 DIAGNOSIS — R079 Chest pain, unspecified: Secondary | ICD-10-CM

## 2010-12-24 MED ORDER — DOXYCYCLINE HYCLATE 100 MG PO CAPS: 100.0000 mg | ORAL_CAPSULE | Freq: Two times a day (BID) | ORAL | Status: AC

## 2010-12-24 MED ORDER — CARVEDILOL 6.25 MG PO TABS: 6.2500 mg | ORAL_TABLET | Freq: Two times a day (BID) | ORAL | Status: DC

## 2010-12-24 NOTE — Assessment & Plan Note (Signed)
Patient had atypical chest pain. He reports no recurrent symptoms. Continue current medical therapy. No clear indication for invasive evaluation.

## 2010-12-24 NOTE — Patient Instructions (Signed)
   Doxycycline 100mg  twice a day x 7 days  Increase Coreg to 6.25mg  twice a day   Your physician wants you to follow up in:  3 months.  You will receive a reminder letter in the mail one-two months in advance.  If you don't receive a letter, please call our office to schedule the follow up appointment

## 2010-12-24 NOTE — Progress Notes (Signed)
CC: Followup after recent hospitalization for chest pain  HPI:  The patient is a 55 year old male with a history of nonischemic cardiomyopathy status post prior cardiac catheterization in 2005-50% LAD lesion. Ejection fraction is 30-35% by echocardiogram September 2011. The patient was admitted recently for chest pain. He ruled out for myocardial infarction and underwent an exercise Cardiolite study. There was a fixed defect inferiorly with ejection fraction of 27%. The patient was treated medically and placed on a regimen of Coreg, lisinopril aspirin high-dose Lipitor and when necessary nitroglycerin. He has done well. He reports no chest pain. He reports no shortness of breath orthopnea or PND. His been compliant with his medical regimen although he did not take his medications this morning.   PMH: reviewed and listed in Problem List in Electronic Records (and see below)  Allergies/SH/FHX : available in Electronic Records for review  Medications: Current Outpatient Prescriptions  Medication Sig Dispense Refill  . aspirin 81 MG tablet Take 81 mg by mouth daily.        [name] atorvastatin (LIPITOR) 80 MG tablet Take 80 mg by mouth daily.        . carvedilol (COREG) 6.25 MG tablet Take 1 tablet (6.25 mg total) by mouth 2 (two) times daily.  60 tablet  6  . esomeprazole (NEXIUM) 40 MG capsule Take 40 mg by mouth daily before breakfast.        . insulin NPH (HUMULIN N,NOVOLIN N) 100 UNIT/ML injection Inject 40 Units into the skin at bedtime.        . insulin regular (HUMULIN R,NOVOLIN R) 100 UNIT/ML injection Inject 20 Units into the skin every morning.        [name] lisinopril (PRINIVIL,ZESTRIL) 10 MG tablet Take 10 mg by mouth daily.        . nitroGLYCERIN (NITROSTAT) 0.4 MG SL tablet Place 0.4 mg under the tongue every 5 (five) minutes as needed.        . doxycycline (VIBRAMYCIN) 100 MG capsule Take 1 capsule (100 mg total) by mouth 2 (two) times daily.  14 capsule  0    ROS: No nausea or vomiting.  No fever or chills.No melena or hematochezia.No bleeding.No claudication  Physical Exam: BP 169/93  Pulse 89  Ht 5\' 9"  (1.753 m)  Wt 237 lb (107.502 kg)  BMI 35.00 kg/m2 General: Well-nourished African American male in no distress Neck: Normal carotid upstroke no carotid bruit, no thyromegaly nonnodular thyroid. JVD is 5 cm Lungs: Clear breath sounds bilaterally without wheezing Cardiac: Regular rate and rhythm with normal S1-S2 and no murmur rubs or gallops. Vascular: Normal peripheral vascular examination with 2+ her soft visit posterior tibial pulses. No edema Skin: Warm and dry  12lead ECG: Limited bedside ECHO:N/A   Assessment and Plan

## 2010-12-24 NOTE — Assessment & Plan Note (Signed)
Continue medical therapy for now. The patient will need to assure compliance. We will follow him closely and recheck his ejection fraction. At present time is not a candidate for ICD because of his history of substance abuse as well as noncompliance with medical therapy and suboptimal treatment. I also do not think invasive ischemia evaluation is no further necessary at this point in time. The patient is asymptomatic.

## 2010-12-24 NOTE — Assessment & Plan Note (Signed)
I have given the patient a prescription of doxycycline 100 mg by mouth twice a day

## 2010-12-24 NOTE — Assessment & Plan Note (Signed)
I counseled the patient regarding his tobacco use.

## 2011-05-04 DIAGNOSIS — I509 Heart failure, unspecified: Secondary | ICD-10-CM | POA: Diagnosis not present

## 2011-05-04 DIAGNOSIS — Z125 Encounter for screening for malignant neoplasm of prostate: Secondary | ICD-10-CM | POA: Diagnosis not present

## 2011-05-04 DIAGNOSIS — I10 Essential (primary) hypertension: Secondary | ICD-10-CM | POA: Diagnosis not present

## 2011-05-04 DIAGNOSIS — K219 Gastro-esophageal reflux disease without esophagitis: Secondary | ICD-10-CM | POA: Diagnosis not present

## 2011-07-29 DIAGNOSIS — I10 Essential (primary) hypertension: Secondary | ICD-10-CM | POA: Diagnosis not present

## 2011-07-29 DIAGNOSIS — E119 Type 2 diabetes mellitus without complications: Secondary | ICD-10-CM | POA: Diagnosis not present

## 2011-07-29 DIAGNOSIS — H1130 Conjunctival hemorrhage, unspecified eye: Secondary | ICD-10-CM | POA: Diagnosis not present

## 2011-07-29 DIAGNOSIS — E559 Vitamin D deficiency, unspecified: Secondary | ICD-10-CM | POA: Diagnosis not present

## 2011-07-29 DIAGNOSIS — E785 Hyperlipidemia, unspecified: Secondary | ICD-10-CM | POA: Diagnosis not present

## 2011-07-29 DIAGNOSIS — K219 Gastro-esophageal reflux disease without esophagitis: Secondary | ICD-10-CM | POA: Diagnosis not present

## 2011-11-01 DIAGNOSIS — E119 Type 2 diabetes mellitus without complications: Secondary | ICD-10-CM | POA: Diagnosis not present

## 2011-11-01 DIAGNOSIS — R5381 Other malaise: Secondary | ICD-10-CM | POA: Diagnosis not present

## 2011-11-01 DIAGNOSIS — E785 Hyperlipidemia, unspecified: Secondary | ICD-10-CM | POA: Diagnosis not present

## 2011-11-01 DIAGNOSIS — I509 Heart failure, unspecified: Secondary | ICD-10-CM | POA: Diagnosis not present

## 2011-11-18 ENCOUNTER — Emergency Department (HOSPITAL_COMMUNITY): Payer: Medicare Other

## 2011-11-18 ENCOUNTER — Emergency Department (HOSPITAL_COMMUNITY)
Admission: EM | Admit: 2011-11-18 | Discharge: 2011-11-18 | Disposition: A | Discharge status: Home / Self Care | Payer: Medicare Other | Attending: Emergency Medicine | Admitting: Emergency Medicine

## 2011-11-18 ENCOUNTER — Encounter (HOSPITAL_COMMUNITY): Payer: Self-pay

## 2011-11-18 DIAGNOSIS — K5792 Diverticulitis of intestine, part unspecified, without perforation or abscess without bleeding: Secondary | ICD-10-CM

## 2011-11-18 DIAGNOSIS — Z794 Long term (current) use of insulin: Secondary | ICD-10-CM | POA: Insufficient documentation

## 2011-11-18 DIAGNOSIS — E119 Type 2 diabetes mellitus without complications: Secondary | ICD-10-CM | POA: Diagnosis not present

## 2011-11-18 DIAGNOSIS — I10 Essential (primary) hypertension: Secondary | ICD-10-CM | POA: Diagnosis not present

## 2011-11-18 DIAGNOSIS — R142 Eructation: Secondary | ICD-10-CM | POA: Insufficient documentation

## 2011-11-18 DIAGNOSIS — Z7982 Long term (current) use of aspirin: Secondary | ICD-10-CM | POA: Insufficient documentation

## 2011-11-18 DIAGNOSIS — K5732 Diverticulitis of large intestine without perforation or abscess without bleeding: Secondary | ICD-10-CM | POA: Insufficient documentation

## 2011-11-18 DIAGNOSIS — I509 Heart failure, unspecified: Secondary | ICD-10-CM | POA: Insufficient documentation

## 2011-11-18 DIAGNOSIS — R109 Unspecified abdominal pain: Secondary | ICD-10-CM | POA: Diagnosis not present

## 2011-11-18 DIAGNOSIS — R141 Gas pain: Secondary | ICD-10-CM | POA: Diagnosis not present

## 2011-11-18 DIAGNOSIS — K219 Gastro-esophageal reflux disease without esophagitis: Secondary | ICD-10-CM | POA: Insufficient documentation

## 2011-11-18 DIAGNOSIS — Z79899 Other long term (current) drug therapy: Secondary | ICD-10-CM | POA: Diagnosis not present

## 2011-11-18 LAB — URINALYSIS, ROUTINE W REFLEX MICROSCOPIC
Bilirubin Urine: NEGATIVE
Nitrite: NEGATIVE
Specific Gravity, Urine: >1.03 — ABNORMAL HIGH (ref 1.005–1.030)
Urobilinogen, UA: 0.2 mg/dL (ref 0.0–1.0)
pH: 6 (ref 5.0–8.0)

## 2011-11-18 LAB — COMPREHENSIVE METABOLIC PANEL
ALT: 35 U/L (ref 0–53)
Albumin: 3.2 g/dL — ABNORMAL LOW (ref 3.5–5.2)
Alkaline Phosphatase: 88 U/L (ref 39–117)
BUN: 10 mg/dL (ref 6–23)
Calcium: 9.3 mg/dL (ref 8.4–10.5)
GFR calc Af Amer: >90 mL/min (ref >90)
Glucose, Bld: 199 mg/dL — ABNORMAL HIGH (ref 70–99)
Potassium: 4 mEq/L (ref 3.5–5.1)
Sodium: 137 mEq/L (ref 135–145)
Total Protein: 6.7 g/dL (ref 6.0–8.3)

## 2011-11-18 LAB — CBC WITH DIFFERENTIAL/PLATELET
Basophils Relative: 1 % (ref 0–1)
Eosinophils Absolute: 0.2 10*3/uL (ref 0.0–0.7)
Eosinophils Relative: 3 % (ref 0–5)
MCH: 27.7 pg (ref 26.0–34.0)
MCHC: 33.3 g/dL (ref 30.0–36.0)
MCV: 83.2 fL (ref 78.0–100.0)
Neutrophils Relative %: 55 % (ref 43–77)
Platelets: 202 10*3/uL (ref 150–400)

## 2011-11-18 LAB — URINE MICROSCOPIC-ADD ON: Urine-Other: NONE SEEN

## 2011-11-18 LAB — LIPASE, BLOOD: Lipase: 23 U/L (ref 11–59)

## 2011-11-18 MED ORDER — METRONIDAZOLE IN NACL 5-0.79 MG/ML-% IV SOLN
500.0000 mg | Freq: Once | INTRAVENOUS | Status: AC
  Administered 2011-11-18: 500 mg via INTRAVENOUS
  Filled 2011-11-18: qty 100

## 2011-11-18 MED ORDER — SODIUM CHLORIDE 0.9 % IV BOLUS (SEPSIS)
500.0000 mL | Freq: Once | INTRAVENOUS | Status: AC
  Administered 2011-11-18: 500 mL via INTRAVENOUS

## 2011-11-18 MED ORDER — SODIUM CHLORIDE 0.9 % IV SOLN
INTRAVENOUS | Status: DC
  Administered 2011-11-18: 12:00:00 via INTRAVENOUS

## 2011-11-18 MED ORDER — IOHEXOL 300 MG/ML  SOLN
100.0000 mL | Freq: Once | INTRAMUSCULAR | Status: AC | PRN
  Administered 2011-11-18: 100 mL via INTRAVENOUS

## 2011-11-18 MED ORDER — ONDANSETRON HCL 4 MG/2ML IJ SOLN
4.0000 mg | Freq: Once | INTRAMUSCULAR | Status: AC
  Administered 2011-11-18: 4 mg via INTRAVENOUS
  Filled 2011-11-18: qty 2

## 2011-11-18 MED ORDER — METRONIDAZOLE 500 MG PO TABS: 500.0000 mg | ORAL_TABLET | Freq: Four times a day (QID) | ORAL | Status: DC

## 2011-11-18 MED ORDER — CIPROFLOXACIN HCL 500 MG PO TABS: 500.0000 mg | ORAL_TABLET | Freq: Two times a day (BID) | ORAL | Status: DC

## 2011-11-18 MED ORDER — OXYCODONE-ACETAMINOPHEN 5-325 MG PO TABS: 1.0000 | ORAL_TABLET | Freq: Four times a day (QID) | ORAL | Status: DC | PRN

## 2011-11-18 MED ORDER — CIPROFLOXACIN IN D5W 400 MG/200ML IV SOLN
400.0000 mg | Freq: Once | INTRAVENOUS | Status: AC
  Administered 2011-11-18: 400 mg via INTRAVENOUS
  Filled 2011-11-18: qty 200

## 2011-11-18 MED ORDER — HYDROMORPHONE HCL PF 1 MG/ML IJ SOLN
1.0000 mg | Freq: Once | INTRAMUSCULAR | Status: AC
  Administered 2011-11-18: 1 mg via INTRAVENOUS
  Filled 2011-11-18: qty 1

## 2011-11-18 MED ORDER — ONDANSETRON HCL 8 MG PO TABS: 8.0000 mg | ORAL_TABLET | ORAL | Status: AC | PRN

## 2011-11-18 NOTE — ED Provider Notes (Signed)
History  This chart was scribed for Nat Christen, MD by Jenne Campus. This patient was seen in room APA03/APA03 and the patient's care was started at 1055.  CSN: [id number]  Arrival date & time 11/18/11  1006   First MD Initiated Contact with Patient 11/18/11 1055      Chief Complaint  Patient presents with  . Bloated     The history is provided by the patient. No language interpreter was used.    Brad Mendoza is a 57 y.o. male who presents to the Emergency Department complaining of 2 to 3 months of intermittent suprapubic abdominal pain that occasional radiates up to the bilateral flanks with associated bloating. The pain is worse when he first wakes up and when he lays down to sleep. He denies any recent falls or injuries and denies having prior episodes of similar symptoms. He denies any urinary symptoms, nausea, emesis and diarrhea. He has a h/o DM, GERD, CHF, HTN and appendectomy. He denies smoking and alcohol use.  PCP is with Josie Saunders.  Past Medical History  Diagnosis Date  . Diabetes mellitus   . CHF (congestive heart failure)   . HTN (hypertension)   . Acid reflux   . Onychomycosis   . Back pain     f4 and f5    Past Surgical History  Procedure Date  . Appendectomy     Family History  Problem Relation Age of Onset  . Heart disease    . Hypertension Father     History  Substance Use Topics  . Smoking status: Never Smoker   . Smokeless tobacco: Current User    Types: Snuff   Comment: dips snuff about 5 cans/week for 3 years  . Alcohol Use: No      Review of Systems  A complete 10 system review of systems was obtained and all systems are negative except as noted in the HPI and PMH.    Allergies  Sulfa antibiotics and Penicillins  Home Medications   Current Outpatient Rx  Name Route Sig Dispense Refill  . ASPIRIN 81 MG PO TABS Oral Take 81 mg by mouth daily.      . ATORVASTATIN CALCIUM 80 MG PO TABS Oral Take 80 mg by mouth daily.       [name] CARVEDILOL 6.25 MG PO TABS Oral Take 1 tablet (6.25 mg total) by mouth 2 (two) times daily. 60 tablet 6    D/C 3.125mg .  Increased to 6.25mg  twice a day on 1 ...  . ESOMEPRAZOLE MAGNESIUM 40 MG PO CPDR Oral Take 40 mg by mouth daily before breakfast.      . INSULIN ISOPHANE HUMAN 100 UNIT/ML [place] SUSP Subcutaneous Inject 40 Units into the skin at bedtime.      . INSULIN REGULAR HUMAN 100 UNIT/ML IJ SOLN Subcutaneous Inject 20 Units into the skin every morning.      [name] LISINOPRIL 10 MG PO TABS Oral Take 10 mg by mouth daily.      [name] NITROGLYCERIN 0.4 MG SL SUBL Sublingual Place 0.4 mg under the tongue every 5 (five) minutes as needed.        Triage Vitals: BP 142/87  Pulse 82  Temp 98.8 F (37.1 C) (Oral)  Resp 20  Ht 5\' 9"  (1.753 m)  Wt 221 lb (100.245 kg)  BMI 32.64 kg/m2  SpO2 100%  Physical Exam  Nursing note and vitals reviewed. Constitutional: He is oriented to person, place, and time. He appears well-developed and well-nourished.  No distress.  HENT:  Head: Normocephalic and atraumatic.  Eyes: Conjunctivae normal and EOM are normal.  Neck: Neck supple. No tracheal deviation present.  Cardiovascular: Normal rate and regular rhythm.   Pulmonary/Chest: Effort normal and breath sounds normal. No respiratory distress.  Abdominal: Soft. There is tenderness. There is no rebound and no guarding.       Minimal lower abdominal tenderness  Musculoskeletal: Normal range of motion.  Neurological: He is alert and oriented to person, place, and time.  Skin: Skin is warm and dry.  Psychiatric: He has a normal mood and affect. His behavior is normal.    ED Course  Procedures (including critical care time)  DIAGNOSTIC STUDIES: Oxygen Saturation is 100% on room air, normal by my interpretation.    COORDINATION OF CARE: 1144-Discussed treatment plan which includes a CT scan, pain management and lab work with pt at bedside and pt agreed to plan.  1200-Ordered 4 mg Zofran, 1 mg  Dilaudid and 500 mL Bolus.  Results for orders placed during the hospital encounter of 11/18/11  CBC WITH DIFFERENTIAL      Component Value Range   WBC 5.5  4.0 - 10.5 K/uL   RBC 5.30  4.22 - 5.81 MIL/uL   Hemoglobin 14.7  13.0 - 17.0 g/dL   HCT 44.1  39.0 - 52.0 %   MCV 83.2  78.0 - 100.0 fL   MCH 27.7  26.0 - 34.0 pg   MCHC 33.3  30.0 - 36.0 g/dL   RDW 12.9  11.5 - 15.5 %   Platelets 202  150 - 400 K/uL   Neutrophils Relative 55  43 - 77 %   Neutro Abs 3.0  1.7 - 7.7 K/uL   Lymphocytes Relative 31  12 - 46 %   Lymphs Abs 1.7  0.7 - 4.0 K/uL   Monocytes Relative 10  3 - 12 %   Monocytes Absolute 0.6  0.1 - 1.0 K/uL   Eosinophils Relative 3  0 - 5 %   Eosinophils Absolute 0.2  0.0 - 0.7 K/uL   Basophils Relative 1  0 - 1 %   Basophils Absolute 0.0  0.0 - 0.1 K/uL  COMPREHENSIVE METABOLIC PANEL      Component Value Range   Sodium 137  135 - 145 mEq/L   Potassium 4.0  3.5 - 5.1 mEq/L   Chloride 105  96 - 112 mEq/L   CO2 24  19 - 32 mEq/L   Glucose, Bld 199 (*) 70 - 99 mg/dL   BUN 10  6 - 23 mg/dL   Creatinine, Ser 0.75  0.50 - 1.35 mg/dL   Calcium 9.3  8.4 - 10.5 mg/dL   Total Protein 6.7  6.0 - 8.3 g/dL   Albumin 3.2 (*) 3.5 - 5.2 g/dL   AST 23  0 - 37 U/L   ALT 35  0 - 53 U/L   Alkaline Phosphatase 88  39 - 117 U/L   Total Bilirubin 0.3  0.3 - 1.2 mg/dL   GFR calc non Af Amer >90  >90 mL/min   GFR calc Af Amer >90  >90 mL/min  LIPASE, BLOOD      Component Value Range   Lipase 23  11 - 59 U/L  URINALYSIS, ROUTINE W REFLEX MICROSCOPIC      Component Value Range   Color, Urine YELLOW  YELLOW   APPearance CLEAR  CLEAR   Specific Gravity, Urine >1.030 (*) 1.005 - 1.030   pH 6.0  5.0 - 8.0   Glucose, UA 100 (*) NEGATIVE mg/dL   Hgb urine dipstick NEGATIVE  NEGATIVE   Bilirubin Urine NEGATIVE  NEGATIVE   Ketones, ur NEGATIVE  NEGATIVE mg/dL   Protein, ur 100 (*) NEGATIVE mg/dL   Urobilinogen, UA 0.2  0.0 - 1.0 mg/dL   Nitrite NEGATIVE  NEGATIVE   Leukocytes, UA  NEGATIVE  NEGATIVE  URINE MICROSCOPIC-ADD ON      Component Value Range   Urine-Other       Value: NO FORMED ELEMENTS SEEN ON URINE MICROSCOPIC EXAMINATION   Ct Abdomen Pelvis W Contrast  11/18/2011  *RADIOLOGY REPORT*  Clinical Data: Lower abdominal pain  CT ABDOMEN AND PELVIS WITH CONTRAST  Technique:  Multidetector CT imaging of the abdomen and pelvis was performed following the standard protocol during bolus administration of intravenous contrast.  Contrast: 130mL OMNIPAQUE IOHEXOL 300 MG/ML  SOLN  Comparison: 12/08/2009  Findings: And heart size within normal limits.  Mild areas of scarring within the lung bases.  No pleural or pericardial effusion.  Nonspecific low attenuation of the liver post contrast suggests fatty infiltration.  Unremarkable biliary system, spleen, pancreas, adrenal glands.  Lobular renal contours.  No hydronephrosis or hydroureter.  Colonic diverticulosis.  Sigmoid colon/descending colon junction diverticulitis.  Associated pericolonic fat stranding.  No loculated fluid collection or free intraperitoneal air.  No lymphadenopathy.  Normal caliber vasculature.  Decompressed bladder.  No pelvic lymphadenopathy.  Bilateral SI joint ankylosis. Unchanged sclerosis within S1.  Lower thoracic degenerative changes.  No acute osseous finding.  IMPRESSION: Sigmoid/descending colon junction diverticulitis.  Question hepatic steatosis.   Original Report Authenticated By: Suanne Marker, M.D.      No diagnosis found.    MDM  History and physical consistent with diverticulitis. IV Cipro and Flagyl administered in ED. Will discharge home with Cipro, Flagyl, Percocet, Zofran. No acute abdomen  I personally performed the services described in this documentation, which was scribed in my presence. The recorded information has been reviewed and considered.        Nat Christen, MD 11/18/11 1547

## 2011-11-18 NOTE — ED Notes (Signed)
Patient with no complaints at this time. Respirations even and unlabored. Skin warm/dry. Discharge instructions reviewed with patient at this time. Patient given opportunity to voice concerns/ask questions. IV removed per policy and band-aid applied to site. Patient discharged at this time and left Emergency Department with steady gait.  

## 2011-11-18 NOTE — ED Notes (Signed)
Complain of pain and bloating for months. States it has been getting worse over the past few days

## 2012-02-20 DIAGNOSIS — I208 Other forms of angina pectoris: Secondary | ICD-10-CM | POA: Diagnosis not present

## 2012-02-20 DIAGNOSIS — I509 Heart failure, unspecified: Secondary | ICD-10-CM | POA: Diagnosis not present

## 2012-02-20 LAB — CBC AND DIFFERENTIAL
HCT: 50 % (ref 41–53)
Hemoglobin: 16 g/dL (ref 13.5–17.5)

## 2012-02-20 LAB — LIPID PANEL: LDL Cholesterol: 157 mg/dL

## 2012-03-07 ENCOUNTER — Encounter: Payer: Self-pay | Admitting: *Deleted

## 2012-03-07 ENCOUNTER — Other Ambulatory Visit (HOSPITAL_COMMUNITY): Payer: Self-pay | Admitting: Cardiology

## 2012-03-07 DIAGNOSIS — I429 Cardiomyopathy, unspecified: Secondary | ICD-10-CM

## 2012-03-07 DIAGNOSIS — R5383 Other fatigue: Secondary | ICD-10-CM

## 2012-03-07 DIAGNOSIS — R9431 Abnormal electrocardiogram [ECG] [EKG]: Secondary | ICD-10-CM | POA: Diagnosis not present

## 2012-03-07 DIAGNOSIS — R9430 Abnormal result of cardiovascular function study, unspecified: Secondary | ICD-10-CM | POA: Diagnosis not present

## 2012-03-12 ENCOUNTER — Ambulatory Visit (HOSPITAL_COMMUNITY): Payer: Medicare Other

## 2012-05-17 ENCOUNTER — Other Ambulatory Visit: Payer: Self-pay | Admitting: *Deleted

## 2012-05-17 MED ORDER — GLUCOSE BLOOD VI STRP: ORAL_STRIP | Status: DC

## 2012-05-22 ENCOUNTER — Ambulatory Visit: Payer: Self-pay | Admitting: Family Medicine

## 2012-07-11 ENCOUNTER — Encounter: Payer: Self-pay | Admitting: Family Medicine

## 2012-07-11 ENCOUNTER — Ambulatory Visit (INDEPENDENT_AMBULATORY_CARE_PROVIDER_SITE_OTHER): Payer: Medicare Other | Admitting: Family Medicine

## 2012-07-11 ENCOUNTER — Other Ambulatory Visit: Payer: Self-pay | Admitting: *Deleted

## 2012-07-11 VITALS — BP 120/68 | HR 72 | Temp 97.5°F | Ht 69.0 in | Wt 214.0 lb

## 2012-07-11 DIAGNOSIS — J302 Other seasonal allergic rhinitis: Secondary | ICD-10-CM | POA: Insufficient documentation

## 2012-07-11 DIAGNOSIS — E785 Hyperlipidemia, unspecified: Secondary | ICD-10-CM | POA: Diagnosis not present

## 2012-07-11 DIAGNOSIS — I10 Essential (primary) hypertension: Secondary | ICD-10-CM | POA: Diagnosis not present

## 2012-07-11 DIAGNOSIS — E669 Obesity, unspecified: Secondary | ICD-10-CM

## 2012-07-11 DIAGNOSIS — I428 Other cardiomyopathies: Secondary | ICD-10-CM

## 2012-07-11 DIAGNOSIS — E119 Type 2 diabetes mellitus without complications: Secondary | ICD-10-CM

## 2012-07-11 DIAGNOSIS — R635 Abnormal weight gain: Secondary | ICD-10-CM | POA: Diagnosis not present

## 2012-07-11 DIAGNOSIS — J309 Allergic rhinitis, unspecified: Secondary | ICD-10-CM

## 2012-07-11 LAB — HEPATIC FUNCTION PANEL
ALT: 24 U/L (ref 0–53)
AST: 20 U/L (ref 0–37)
Albumin: 3.9 g/dL (ref 3.5–5.2)
Alkaline Phosphatase: 84 U/L (ref 39–117)
Bilirubin, Direct: 0.1 mg/dL (ref 0.0–0.3)
Indirect Bilirubin: 0.3 mg/dL (ref 0.0–0.9)
Total Bilirubin: 0.4 mg/dL (ref 0.3–1.2)
Total Protein: 6.6 g/dL (ref 6.0–8.3)

## 2012-07-11 LAB — BASIC METABOLIC PANEL WITH GFR
BUN: 17 mg/dL (ref 6–23)
CO2: 23 mEq/L (ref 19–32)
Calcium: 9.6 mg/dL (ref 8.4–10.5)
Chloride: 102 mEq/L (ref 96–112)
Creat: 1.04 mg/dL (ref 0.50–1.35)
GFR, Est African American: >89 mL/min
GFR, Est Non African American: 80 mL/min
Glucose, Bld: 371 mg/dL — ABNORMAL HIGH (ref 70–99)
Potassium: 4.5 mEq/L (ref 3.5–5.3)
Sodium: 135 mEq/L (ref 135–145)

## 2012-07-11 LAB — POCT GLYCOSYLATED HEMOGLOBIN (HGB A1C): Hemoglobin A1C: >14

## 2012-07-11 LAB — POCT UA - MICROALBUMIN

## 2012-07-11 MED ORDER — DESLORATADINE 5 MG PO TABS: 5.0000 mg | ORAL_TABLET | Freq: Every day | ORAL | Status: DC

## 2012-07-11 NOTE — Patient Instructions (Signed)
      Dr Ananda Sitzer's Recommendations  Diet and Exercise discussed with patient.  For nutrition information, I recommend books:  1).Eat to Live by Dr Joel Fuhrman. 2).Prevent and Reverse Heart Disease by Dr Caldwell Esselstyn. 3) Dr Neal Barnard's Book: Reversing Diabetes  Exercise recommendations are:  If unable to walk, then the patient can exercise in a chair 3 times a day. By flapping arms like a bird gently and raising legs outwards to the front.  If ambulatory, the patient can go for walks for 30 minutes 3 times a week. Then increase the intensity and duration as tolerated.  Goal is to try to attain exercise frequency to 5 times a week.  If applicable: Best to perform resistance exercises (machines or weights) 2 days a week and cardio type exercises 3 days per week.  

## 2012-07-11 NOTE — Progress Notes (Signed)
Patient ID: Brad Mendoza, male   DOB: 1955/10/20, 57 y.o.   MRN: [id number] SUBJECTIVE: HPI: Patient is here for follow up of Diabetes Mellitus/Hyperlipidemia/hypertension/: Symptoms of DM: has Nocturia ,has Urinary Frequency , denies Blurred vision ,deniesDizziness,denies.Dysuria,denies paresthesias, denies extremity pain or ulcers.Marland Kitchendenies chest pain. has had an annual eye exam.:2013 do check the feet. Does check CBGs. Average CBG:200+ His HGBA1C has been high for the past year. Denies episodes of hypoglycemia. Does have an emergency hypoglycemic plan. admits toCompliance with medications. Denies Problems with medications. He has multiple medical problems  As documented in the EMR.  PMH/PSH: reviewed/updated in Epic  SH/FH: reviewed/updated in Epic  Allergies: reviewed/updated in Epic  Medications: reviewed/updated in Epic  Immunizations: reviewed/updated in Epic  ROS: As above in the HPI. All other systems are stable or negative.  OBJECTIVE: APPEARANCE:  Patient in no acute distress.The patient appeared well nourished and normally developed. Acyanotic. Waist:42 inches VITAL SIGNS:BP 120/68  Pulse 72  Temp(Src) 97.5 F (36.4 C) (Oral)  Ht 5\' 9"  (1.753 m)  Wt 214 lb (97.07 kg)  BMI 31.59 kg/m2  AAM central obesity  SKIN: warm and  Dry without overt rashes, tattoos and scars  HEAD and Neck: without JVD, Head and scalp: normal Eyes:No scleral icterus. Fundi normal, eye movements normal. Ears: Auricle normal, canal normal, Tympanic membranes normal, insufflation normal. Nose: normal Throat: normal Neck & thyroid: normal  CHEST & LUNGS: Chest wall: normal Lungs: Clear  CVS: Reveals the PMI to be normally located. Regular rhythm, First and Second Heart sounds are normal,  absence of murmurs, rubs or gallops. Peripheral vasculature: Radial pulses: normal Dorsal pedis pulses: normal Posterior pulses: normal  ABDOMEN:  Appearance: normal Benign,, no  organomegaly, no masses, no Abdominal Aortic enlargement. No Guarding , no rebound. No Bruits. Bowel sounds: normal  RECTAL: N/A GU: N/A  EXTREMETIES: nonedematous. Both Femoral and Pedal pulses are normal.  MUSCULOSKELETAL:  Spine: normal Joints: intact  NEUROLOGIC: oriented to time,place and person; nonfocal. Strength is normal Sensory is normal Reflexes are normal Cranial Nerves are normal.  ASSESSMENT: Obesity, unspecified  Nonischemic cardiomyopathy  HYPERTENSION - Plan: BASIC METABOLIC PANEL WITH GFR  HYPERLIPIDEMIA - Plan: Hepatic function panel, NMR Lipoprofile with Lipids  DM - Plan: BASIC METABOLIC PANEL WITH GFR, POCT glycosylated hemoglobin (Hb A1C), POCT UA - Microalbumin, Microalbumin, urine  Seasonal allergic rhinitis - Plan: desloratadine (CLARINEX) 5 MG tablet   Uncontrolled DM.  Patient doesn't read well.   PLAN:      Dr Paula Libra Recommendations  Diet and Exercise discussed with patient.  For nutrition information, I recommend books:  1).Eat to Live by Dr Excell Seltzer. 2).Prevent and Reverse Heart Disease by Dr Karl Luke. 3) Dr Janene Harvey Book: Reversing Diabetes  Exercise recommendations are:  If unable to walk, then the patient can exercise in a chair 3 times a day. By flapping arms like a bird gently and raising legs outwards to the front.  If ambulatory, the patient can go for walks for 30 minutes 3 times a week. Then increase the intensity and duration as tolerated.  Goal is to try to attain exercise frequency to 5 times a week.  If applicable: Best to perform resistance exercises (machines or weights) 2 days a week and cardio type exercises 3 days per week.  Orders Placed This Encounter  Procedures  . BASIC METABOLIC PANEL WITH GFR  . Hepatic function panel  . NMR Lipoprofile with Lipids  . Microalbumin, urine  . POCT glycosylated  hemoglobin (Hb A1C)  . POCT UA - Microalbumin   Results for orders placed in  visit on 07/11/12  POCT GLYCOSYLATED HEMOGLOBIN (HGB A1C)      Result Value Range   Hemoglobin A1C >14.0    POCT UA - MICROALBUMIN      Result Value Range   Microalbumin Ur, POC 20mg /l     Creatinine, POC       Albumin/Creatinine Ratio, Urine, POC       Meds ordered this encounter  Medications  . desloratadine (CLARINEX) 5 MG tablet    Sig: Take 1 tablet (5 mg total) by mouth daily.    Dispense:  30 tablet    Refill:  3    RTC 3 months  RTC to Pharmacist 2 weeks for DM education.

## 2012-07-12 LAB — MICROALBUMIN, URINE: Microalb, Ur: 31.07 mg/dL — ABNORMAL HIGH (ref 0.00–1.89)

## 2012-07-13 LAB — NMR LIPOPROFILE WITH LIPIDS
Cholesterol, Total: 277 mg/dL — ABNORMAL HIGH (ref <200)
HDL Particle Number: 31.7 umol/L (ref >=30.5)
HDL Size: 9.2 nm (ref >=9.2)
HDL-C: 39 mg/dL — ABNORMAL LOW (ref >=40)
LDL (calc): 134 mg/dL — ABNORMAL HIGH (ref <100)
LDL Particle Number: 1993 nmol/L — ABNORMAL HIGH (ref <1000)
LDL Size: 19.5 nm — ABNORMAL LOW (ref >20.5)
LP-IR Score: 42 (ref <=45)
Large HDL-P: 3 umol/L — ABNORMAL LOW (ref >=4.8)
Large VLDL-P: 1.5 nmol/L (ref <=2.7)
Small LDL Particle Number: 1733 nmol/L — ABNORMAL HIGH (ref <=527)
Triglycerides: 522 mg/dL — ABNORMAL HIGH (ref <150)
VLDL Size: 41.8 nm (ref <=46.6)

## 2012-07-24 ENCOUNTER — Telehealth: Payer: Self-pay | Admitting: Family Medicine

## 2012-07-24 NOTE — Telephone Encounter (Signed)
Needs lancets and strips for glucometer One touch ultra  Mini Lancets --30gauge [facility]

## 2012-07-25 MED ORDER — ROSUVASTATIN CALCIUM 10 MG PO TABS: 10.0000 mg | ORAL_TABLET | Freq: Every day | ORAL | Status: DC

## 2012-07-25 NOTE — Telephone Encounter (Signed)
This has been done.

## 2012-07-25 NOTE — Telephone Encounter (Signed)
Done Dx 250.92 uncontrolled DM@ on Insulin #400 test strips and lancets 30 gauge refillx1 year. Sig: test pre-meals and hs.  Jaquese Irving P. Jacelyn Grip, M.D.

## 2012-07-26 ENCOUNTER — Encounter: Payer: Self-pay | Admitting: Family Medicine

## 2012-07-26 ENCOUNTER — Ambulatory Visit (INDEPENDENT_AMBULATORY_CARE_PROVIDER_SITE_OTHER): Payer: Medicare Other | Admitting: Family Medicine

## 2012-07-26 VITALS — BP 159/80 | HR 81 | Temp 98.7°F | Wt 222.2 lb

## 2012-07-26 DIAGNOSIS — I10 Essential (primary) hypertension: Secondary | ICD-10-CM | POA: Diagnosis not present

## 2012-07-26 DIAGNOSIS — I428 Other cardiomyopathies: Secondary | ICD-10-CM

## 2012-07-26 DIAGNOSIS — E119 Type 2 diabetes mellitus without complications: Secondary | ICD-10-CM

## 2012-07-26 DIAGNOSIS — Z23 Encounter for immunization: Secondary | ICD-10-CM | POA: Diagnosis not present

## 2012-07-26 DIAGNOSIS — F17200 Nicotine dependence, unspecified, uncomplicated: Secondary | ICD-10-CM

## 2012-07-26 DIAGNOSIS — E669 Obesity, unspecified: Secondary | ICD-10-CM

## 2012-07-26 DIAGNOSIS — E785 Hyperlipidemia, unspecified: Secondary | ICD-10-CM

## 2012-07-26 MED ORDER — LISINOPRIL 20 MG PO TABS: 20.0000 mg | ORAL_TABLET | Freq: Every day | ORAL | Status: DC

## 2012-07-26 NOTE — Progress Notes (Signed)
Tolerated tdap well

## 2012-07-26 NOTE — Patient Instructions (Addendum)
Tetanus, Diphtheria, Pertussis (Tdap) Vaccine What You Need to Know WHY GET VACCINATED? Tetanus, diphtheria and pertussis can be very serious diseases, even for adolescents and adults. Tdap vaccine can protect us from these diseases. TETANUS (Lockjaw) causes painful muscle tightening and stiffness, usually all over the body.  It can lead to tightening of muscles in the head and neck so you can't open your mouth, swallow, or sometimes even breathe. Tetanus kills about 1 out of 5 people who are infected. DIPHTHERIA can cause a thick coating to form in the back of the throat.  It can lead to breathing problems, paralysis, heart failure, and death. PERTUSSIS (Whooping Cough) causes severe coughing spells, which can cause difficulty breathing, vomiting and disturbed sleep.  It can also lead to weight loss, incontinence, and rib fractures. Up to 2 in 100 adolescents and 5 in 100 adults with pertussis are hospitalized or have complications, which could include pneumonia and death. These diseases are caused by bacteria. Diphtheria and pertussis are spread from person to person through coughing or sneezing. Tetanus enters the body through cuts, scratches, or wounds. Before vaccines, the [place] saw as many as 200,000 cases a year of diphtheria and pertussis, and hundreds of cases of tetanus. Since vaccination began, tetanus and diphtheria have dropped by about 99% and pertussis by about 80%. TDAP VACCINE Tdap vaccine can protect adolescents and adults from tetanus, diphtheria, and pertussis. One dose of Tdap is routinely given at age 11 or 12. People who did not get Tdap at that age should get it as soon as possible. Tdap is especially important for health care professionals and anyone having close contact with a baby younger than 12 months. Pregnant women should get a dose of Tdap during every pregnancy, to protect the newborn from pertussis. Infants are most at risk for severe, life-threatening  complications from pertussis. A similar vaccine, called Td, protects from tetanus and diphtheria, but not pertussis. A Td booster should be given every 10 years. Tdap may be given as one of these boosters if you have not already gotten a dose. Tdap may also be given after a severe cut or burn to prevent tetanus infection. Your doctor can give you more information. Tdap may safely be given at the same time as other vaccines. SOME PEOPLE SHOULD NOT GET THIS VACCINE  If you ever had a life-threatening allergic reaction after a dose of any tetanus, diphtheria, or pertussis containing vaccine, OR if you have a severe allergy to any part of this vaccine, you should not get Tdap. Tell your doctor if you have any severe allergies.  If you had a coma, or long or multiple seizures within 7 days after a childhood dose of DTP or DTaP, you should not get Tdap, unless a cause other than the vaccine was found. You can still get Td.  Talk to your doctor if you:  have epilepsy or another nervous system problem,  had severe pain or swelling after any vaccine containing diphtheria, tetanus or pertussis,  ever had Guillain-Barr Syndrome (GBS),  aren't feeling well on the day the shot is scheduled. RISKS OF A VACCINE REACTION With any medicine, including vaccines, there is a chance of side effects. These are usually mild and go away on their own, but serious reactions are also possible. Brief fainting spells can follow a vaccination, leading to injuries from falling. Sitting or lying down for about 15 minutes can help prevent these. Tell your doctor if you feel dizzy or light-headed, or   have vision changes or ringing in the ears. Mild problems following Tdap (Did not interfere with activities)  Pain where the shot was given (about 3 in 4 adolescents or 2 in 3 adults)  Redness or swelling where the shot was given (about 1 person in 5)  Mild fever of at least 100.59F (up to about 1 in 25 adolescents or 1 in  100 adults)  Headache (about 3 or 4 people in 10)  Tiredness (about 1 person in 3 or 4)  Nausea, vomiting, diarrhea, stomach ache (up to 1 in 4 adolescents or 1 in 10 adults)  Chills, body aches, sore joints, rash, swollen glands (uncommon) Moderate problems following Tdap (Interfered with activities, but did not require medical attention)  Pain where the shot was given (about 1 in 5 adolescents or 1 in 100 adults)  Redness or swelling where the shot was given (up to about 1 in 16 adolescents or 1 in 25 adults)  Fever over 102F (about 1 in 100 adolescents or 1 in 250 adults)  Headache (about 3 in 20 adolescents or 1 in 10 adults)  Nausea, vomiting, diarrhea, stomach ache (up to 1 or 3 people in 100)  Swelling of the entire arm where the shot was given (up to about 3 in 100). Severe problems following Tdap (Unable to perform usual activities, required medical attention)  Swelling, severe pain, bleeding and redness in the arm where the shot was given (rare). A severe allergic reaction could occur after any vaccine (estimated less than 1 in a million doses). WHAT IF THERE IS A SERIOUS REACTION? What should I look for?  Look for anything that concerns you, such as signs of a severe allergic reaction, very high fever, or behavior changes. Signs of a severe allergic reaction can include hives, swelling of the face and throat, difficulty breathing, a fast heartbeat, dizziness, and weakness. These would start a few minutes to a few hours after the vaccination. What should I do?  If you think it is a severe allergic reaction or other emergency that can't wait, call 9-1-1 or get the person to the nearest hospital. Otherwise, call your doctor.  Afterward, the reaction should be reported to the "Vaccine Adverse Event Reporting System" (VAERS). Your doctor might file this report, or you can do it yourself through the VAERS web site at www.vaers.SamedayNews.es, or by calling [phone]. VAERS is  only for reporting reactions. They do not give medical advice.  THE NATIONAL VACCINE INJURY COMPENSATION PROGRAM The National Vaccine Injury Compensation Program (VICP) is a federal program that was created to compensate people who may have been injured by certain vaccines. Persons who believe they may have been injured by a vaccine can learn about the program and about filing a claim by calling 7853337030 or visiting the [place] website at GoldCloset.com.ee. HOW CAN I LEARN MORE?  Ask your doctor.  Call your local or state health department.  Contact the Centers for Disease Control and Prevention (CDC):  Call (364)661-3485 or visit CDC's website at http://hunter.com/. CDC Tdap Vaccine VIS (06/30/11) Document Released: 08/09/2011 Document Revised: 11/02/2011 Document Reviewed: 08/09/2011 ExitCare Patient Information 2014 [place].   Novolog/Humalog  dosing:   Blood sugar level  Breakfast  Lunch  Supper Bed   80-120    4  2   3    121-160   5  3   4    161-200              6  4  5   201-240   7  5    6   2    241-280    8   6   7    3    281-320    9   7   8    4    321-360    10  8   9   5    361-400    11  9   10    6    Omega 3 fish oil 2 g twice a day.

## 2012-07-26 NOTE — Progress Notes (Signed)
Patient ID: Brad Mendoza, male   DOB: 16-Oct-1955, 57 y.o.   MRN: [id number]   SUBJECTIVE: Chief Complaint  Patient presents with  . Follow-up    will need appt with pharmascist  states get cramps in feet  . Immunizations    wanst tdap   HPI: Was supposed to see the Pharmacist for DM education and medication adjustment. But was scheduled with Dr Jacelyn Grip instead. DM uncontrolled. Sugars running high in 200s.  PMH/PSH: reviewed/updated in Epic  SH/FH: reviewed/updated in Epic  Allergies: reviewed/updated in Epic  Medications: reviewed/updated in Epic Current outpatient prescriptions:aspirin EC 81 MG tablet, Take 81 mg by mouth daily., Disp: , Rfl: ;  B-D INS SYRINGE 0.5CC/30GX1/2" 30G X 1/2" 0.5 ML MISC, , Disp: , Rfl: ;  carvedilol (COREG) 3.125 MG tablet, Take 3.125 mg by mouth 2 (two) times daily., Disp: , Rfl: ;  desloratadine (CLARINEX) 5 MG tablet, Take 1 tablet (5 mg total) by mouth daily., Disp: 30 tablet, Rfl: 3 esomeprazole (NEXIUM) 40 MG capsule, Take 40 mg by mouth daily before breakfast.  , Disp: , Rfl: ;  furosemide (LASIX) 40 MG tablet, Take 40 mg by mouth daily., Disp: , Rfl: ;  glucose blood (ACCU-CHEK AVIVA PLUS) test strip, Use as instructed, Disp: 100 each, Rfl: 0;  insulin NPH (HUMULIN N,NOVOLIN N) 100 UNIT/ML injection, Inject 40 Units into the skin at bedtime.  , Disp: , Rfl:  insulin regular (NOVOLIN R,HUMULIN R) 100 units/mL injection, Inject 20 Units into the skin every morning., Disp: , Rfl: ;  lisinopril (PRINIVIL,ZESTRIL) 20 MG tablet, Take 1 tablet (20 mg total) by mouth daily., Disp: 30 tablet, Rfl: 5;  metFORMIN (GLUCOPHAGE) 1000 MG tablet, Take 1,000 mg by mouth daily with breakfast., Disp: , Rfl: ;  Multiple Vitamins-Minerals (CENTRUM SILVER ADULT 50+ PO), Take 1 tablet by mouth daily., Disp: , Rfl:  nitroGLYCERIN (NITROSTAT) 0.4 MG SL tablet, Place 0.4 mg under the tongue every 5 (five) minutes as needed.  , Disp: , Rfl: ;  rosuvastatin (CRESTOR) 10 MG tablet,  Take 1 tablet (10 mg total) by mouth daily., Disp: 30 tablet, Rfl: 3;  fluticasone (FLONASE) 50 MCG/ACT nasal spray, Place 1 spray into the nose daily., Disp: , Rfl:  omega-3 fish oil (MAXEPA) 1000 MG CAPS capsule, Take 2 capsules (2,000 mg total) by mouth 2 (two) times daily., Disp: 90 each, Rfl:   Immunizations: reviewed/updated in Epic  ROS: As above in the HPI. All other systems are stable or negative.  OBJECTIVE: APPEARANCE:  Patient in no acute distress.The patient appeared well nourished and normally developed. Acyanotic. Waist: VITAL SIGNS:BP 159/80  Pulse 81  Temp(Src) 98.7 F (37.1 C) (Oral)  Wt 222 lb 3.2 oz (100.789 kg)  BMI 32.8 kg/m2 Obese AAM Results for orders placed in visit on [id number]  BASIC METABOLIC PANEL WITH GFR      Result Value Range   Sodium 135  135 - 145 mEq/L   Potassium 4.5  3.5 - 5.3 mEq/L   Chloride 102  96 - 112 mEq/L   CO2 23  19 - 32 mEq/L   Glucose, Bld 371 (*) 70 - 99 mg/dL   BUN 17  6 - 23 mg/dL   Creat 1.04  0.50 - 1.35 mg/dL   Calcium 9.6  8.4 - 10.5 mg/dL   GFR, Est African American >89     GFR, Est Non African American 80    HEPATIC FUNCTION PANEL      Result Value Range  Total Bilirubin 0.4  0.3 - 1.2 mg/dL   Bilirubin, Direct 0.1  0.0 - 0.3 mg/dL   Indirect Bilirubin 0.3  0.0 - 0.9 mg/dL   Alkaline Phosphatase 84  39 - 117 U/L   AST 20  0 - 37 U/L   ALT 24  0 - 53 U/L   Total Protein 6.6  6.0 - 8.3 g/dL   Albumin 3.9  3.5 - 5.2 g/dL  NMR LIPOPROFILE WITH LIPIDS      Result Value Range   LDL Particle Number 1993 (*) <1000 nmol/L   LDL (calc) 134 (*) <100 mg/dL   HDL-C 39 (*) >=40 mg/dL   Triglycerides 522 (*) <150 mg/dL   Cholesterol, Total 277 (*) <200 mg/dL   HDL Particle Number 31.7  >=30.5 umol/L   Large HDL-P 3.0 (*) >=4.8 umol/L   Large VLDL-P 1.5  <=2.7 nmol/L   Small LDL Particle Number [id number] (*) <=527 nmol/L   LDL Size 19.5 (*) >20.5 nm   HDL Size 9.2  >=9.2 nm   VLDL Size 41.8  <=46.6 nm   LP-IR Score 42   <=45  MICROALBUMIN, URINE      Result Value Range   Microalb, Ur 31.07 (*) 0.00 - 1.89 mg/dL  POCT GLYCOSYLATED HEMOGLOBIN (HGB A1C)      Result Value Range   Hemoglobin A1C >14.0    POCT UA - MICROALBUMIN      Result Value Range   Microalbumin Ur, POC 20mg /l     Creatinine, POC       Albumin/Creatinine Ratio, Urine, POC        ASSESSMENT: Need for Tdap vaccination - Plan: Tdap vaccine greater than or equal to 7yo IM  Diabetes mellitus  HTN (hypertension) - Plan: lisinopril (PRINIVIL,ZESTRIL) 20 MG tablet  HLD (hyperlipidemia) - Plan: omega-3 fish oil (MAXEPA) 1000 MG CAPS capsule  NONDEPENDENT TOBACCO USE DISORDER  Nonischemic cardiomyopathy  Obesity, unspecified     PLAN:  Novolog/Humalog  dosing:   Blood sugar level  Breakfast  Lunch  Supper Bed   80-120    4  2   3    121-160   5  3   4    161-200              6  4   5    201-240   7  5    6   2    241-280    8   6   7    3    281-320    9   7   8    4    321-360    10  8   9   5    361-400    11  9   10    6    Omega 3 fish oil 2 g twice a day.   Meds ordered this encounter  Medications  . lisinopril (PRINIVIL,ZESTRIL) 20 MG tablet    Sig: Take 1 tablet (20 mg total) by mouth daily.    Dispense:  30 tablet    Refill:  5  . omega-3 fish oil (MAXEPA) 1000 MG CAPS capsule    Sig: Take 2 capsules (2,000 mg total) by mouth 2 (two) times daily.    Dispense:  90 each   Orders Placed This Encounter  Procedures  . Tdap vaccine greater than or equal to 7yo IM  placed on SS on his regular insulin premeals and at night  Return in about 1 week (around  08/02/2012) for to see the pharmacist for DM .  Keep the follow up with me in 2 to 3 months.  Brad Mendoza P. Jacelyn Grip, M.D.

## 2012-08-01 ENCOUNTER — Ambulatory Visit: Payer: Medicare Other

## 2012-08-01 ENCOUNTER — Ambulatory Visit: Payer: Self-pay

## 2012-08-22 ENCOUNTER — Ambulatory Visit: Payer: Self-pay

## 2012-09-06 ENCOUNTER — Telehealth: Payer: Self-pay | Admitting: Cardiology

## 2012-09-10 ENCOUNTER — Ambulatory Visit: Payer: Self-pay

## 2012-10-01 ENCOUNTER — Telehealth (HOSPITAL_COMMUNITY): Payer: Self-pay | Admitting: Cardiology

## 2012-10-01 NOTE — Telephone Encounter (Signed)
  [facility] LOC[facility] AT [facility] [place], [address] [place], [place] [address] [id number]   Date:10/01/2012  Dear Dr. Ellyn Hack  Our office has attempted to contact your patient Brad Mendoza (1955/05/29)  twice by telephone and we have also sent an appointment letter to schedule the 2D Echocardiogram test you ordered. The patient has not responded. We will not make any further attempts to contact the patient. If any further assistance is needed for this referral, please contact our office at [phone]  Sincerely, [facility] Scheduling Team

## 2012-10-03 ENCOUNTER — Telehealth: Payer: Self-pay | Admitting: Family Medicine

## 2012-10-04 ENCOUNTER — Other Ambulatory Visit: Payer: Self-pay

## 2012-10-04 MED ORDER — INSULIN NPH (HUMAN) (ISOPHANE) 100 UNIT/ML ~~LOC~~ SUSP: 40.0000 [IU] | Freq: Every day | SUBCUTANEOUS | Status: DC

## 2012-10-04 MED ORDER — CARVEDILOL 3.125 MG PO TABS: 3.1250 mg | ORAL_TABLET | Freq: Two times a day (BID) | ORAL | Status: DC

## 2012-10-04 MED ORDER — INSULIN REGULAR HUMAN 100 UNIT/ML IJ SOLN: 20.0000 [IU] | Freq: Every morning | INTRAMUSCULAR | Status: DC

## 2012-10-04 NOTE — Telephone Encounter (Signed)
Order was discontined

## 2012-10-05 ENCOUNTER — Telehealth: Payer: Self-pay | Admitting: Family Medicine

## 2012-10-05 NOTE — Telephone Encounter (Signed)
Done 10/04/12 by Burt Ek

## 2012-10-11 ENCOUNTER — Ambulatory Visit (INDEPENDENT_AMBULATORY_CARE_PROVIDER_SITE_OTHER): Payer: Medicare Other

## 2012-10-11 ENCOUNTER — Encounter: Payer: Self-pay | Admitting: Family Medicine

## 2012-10-11 ENCOUNTER — Ambulatory Visit (INDEPENDENT_AMBULATORY_CARE_PROVIDER_SITE_OTHER): Payer: Medicare Other | Admitting: Family Medicine

## 2012-10-11 VITALS — BP 152/79 | HR 68 | Temp 97.0°F | Ht 69.0 in | Wt 217.4 lb

## 2012-10-11 DIAGNOSIS — R0789 Other chest pain: Secondary | ICD-10-CM

## 2012-10-11 DIAGNOSIS — E785 Hyperlipidemia, unspecified: Secondary | ICD-10-CM | POA: Diagnosis not present

## 2012-10-11 DIAGNOSIS — E119 Type 2 diabetes mellitus without complications: Secondary | ICD-10-CM

## 2012-10-11 DIAGNOSIS — R635 Abnormal weight gain: Secondary | ICD-10-CM

## 2012-10-11 DIAGNOSIS — I10 Essential (primary) hypertension: Secondary | ICD-10-CM | POA: Diagnosis not present

## 2012-10-11 DIAGNOSIS — I428 Other cardiomyopathies: Secondary | ICD-10-CM | POA: Diagnosis not present

## 2012-10-11 DIAGNOSIS — E669 Obesity, unspecified: Secondary | ICD-10-CM

## 2012-10-11 LAB — POCT CBC
Granulocyte percent: 70.7 %G (ref 37–80)
HCT, POC: 46.5 % (ref 43.5–53.7)
Hemoglobin: 15.5 g/dL (ref 14.1–18.1)
Lymph, poc: 2 (ref 0.6–3.4)
MCH, POC: 30.3 pg (ref 27–31.2)
MCHC: 33.3 g/dL (ref 31.8–35.4)
MCV: 91 fL (ref 80–97)
MPV: 7.6 fL (ref 0–99.8)
POC Granulocyte: 5.2 (ref 2–6.9)
POC LYMPH PERCENT: 26.9 %L (ref 10–50)
Platelet Count, POC: 200 10*3/uL (ref 142–424)
RBC: 5.1 M/uL (ref 4.69–6.13)
RDW, POC: 13 %
WBC: 7.4 10*3/uL (ref 4.6–10.2)

## 2012-10-11 LAB — GLUCOSE, POCT (MANUAL RESULT ENTRY): POC Glucose: 89 mg/dl (ref 70–99)

## 2012-10-11 LAB — POCT GLYCOSYLATED HEMOGLOBIN (HGB A1C): Hemoglobin A1C: 5.3
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
## 2012-10-11 NOTE — Patient Instructions (Addendum)
Gastroesophageal Reflux Disease, Adult Gastroesophageal reflux disease (GERD) happens when acid from your stomach flows up into the esophagus. When acid comes in contact with the esophagus, the acid causes soreness (inflammation) in the esophagus. Over time, GERD may create small holes (ulcers) in the lining of the esophagus. CAUSES   Increased body weight. This puts pressure on the stomach, making acid rise from the stomach into the esophagus.  Smoking. This increases acid production in the stomach.  Drinking alcohol. This causes decreased pressure in the lower esophageal sphincter (valve or ring of muscle between the esophagus and stomach), allowing acid from the stomach into the esophagus.  Late evening meals and a full stomach. This increases pressure and acid production in the stomach.  A malformed lower esophageal sphincter. Sometimes, no cause is found. SYMPTOMS   Burning pain in the lower part of the mid-chest behind the breastbone and in the mid-stomach area. This may occur twice a week or more often.  Trouble swallowing.  Sore throat.  Dry cough.  Asthma-like symptoms including chest tightness, shortness of breath, or wheezing. DIAGNOSIS  Your caregiver may be able to diagnose GERD based on your symptoms. In some cases, X-rays and other tests may be done to check for complications or to check the condition of your stomach and esophagus. TREATMENT  Your caregiver may recommend over-the-counter or prescription medicines to help decrease acid production. Ask your caregiver before starting or adding any new medicines.  HOME CARE INSTRUCTIONS   Change the factors that you can control. Ask your caregiver for guidance concerning weight loss, quitting smoking, and alcohol consumption.  Avoid foods and drinks that make your symptoms worse, such as:  Caffeine or alcoholic drinks.  Chocolate.  Peppermint or mint flavorings.  Garlic and onions.  Spicy foods.  Citrus fruits,  such as oranges, lemons, or limes.  Tomato-based foods such as sauce, chili, salsa, and pizza.  Fried and fatty foods.  Avoid lying down for the 3 hours prior to your bedtime or prior to taking a nap.  Eat small, frequent meals instead of large meals.  Wear loose-fitting clothing. Do not wear anything tight around your waist that causes pressure on your stomach.  Raise the head of your bed 6 to 8 inches with wood blocks to help you sleep. Extra pillows will not help.  Only take over-the-counter or prescription medicines for pain, discomfort, or fever as directed by your caregiver.  Do not take aspirin, ibuprofen, or other nonsteroidal anti-inflammatory drugs (NSAIDs). SEEK IMMEDIATE MEDICAL CARE IF:   You have pain in your arms, neck, jaw, teeth, or back.  Your pain increases or changes in intensity or duration.  You develop nausea, vomiting, or sweating (diaphoresis).  You develop shortness of breath, or you faint.  Your vomit is green, yellow, black, or looks like coffee grounds or blood.  Your stool is red, bloody, or black. These symptoms could be signs of other problems, such as heart disease, gastric bleeding, or esophageal bleeding. MAKE SURE YOU:   Understand these instructions.  Will watch your condition.  Will get help right away if you are not doing well or get worse. Document Released: 11/17/2004 Document Revised: 05/02/2011 Document Reviewed: 08/27/2010 [facility] Patient Information 2014 [place], [place].        Dr Paula Libra Recommendations  For nutrition information, I recommend books:  1).Eat to Live by Dr Excell Seltzer. 2).Prevent and Reverse Heart Disease by Dr Karl Luke. 3) Dr Janene Harvey Book:  Program to Reverse Diabetes  Exercise recommendations are:  If unable to walk, then the patient can exercise in a chair 3 times a day. By flapping arms like a bird gently and raising legs outwards to the front.  If ambulatory, the patient  can go for walks for 30 minutes 3 times a week. Then increase the intensity and duration as tolerated.  Goal is to try to attain exercise frequency to 5 times a week.  If applicable: Best to perform resistance exercises (machines or weights) 2 days a week and cardio type exercises 3 days per week.

## 2012-10-12 ENCOUNTER — Other Ambulatory Visit: Payer: Self-pay | Admitting: Family Medicine

## 2012-10-12 ENCOUNTER — Telehealth: Payer: Self-pay | Admitting: Family Medicine

## 2012-10-12 MED ORDER — INSULIN REGULAR HUMAN 100 UNIT/ML IJ SOLN: 20.0000 [IU] | Freq: Every morning | INTRAMUSCULAR | Status: DC

## 2012-10-12 MED ORDER — INSULIN NPH (HUMAN) (ISOPHANE) 100 UNIT/ML ~~LOC~~ SUSP: 40.0000 [IU] | Freq: Every day | SUBCUTANEOUS | Status: DC

## 2012-10-12 NOTE — Telephone Encounter (Signed)
Spoke with pt and per dr Jacelyn Grip his insulin was sent in to [facility] " but dr Jacelyn Grip said he did not say anything about "arthritis"

## 2012-10-12 NOTE — Telephone Encounter (Signed)
Left message on pt voice mail rx sent to "the drug store"

## 2012-10-12 NOTE — Telephone Encounter (Signed)
Done in EPIC

## 2012-10-13 LAB — CMP14+EGFR
ALT: 20 IU/L (ref 0–44)
AST: 24 IU/L (ref 0–40)
Albumin/Globulin Ratio: 1.5 (ref 1.1–2.5)
Albumin: 3.7 g/dL (ref 3.5–5.5)
Alkaline Phosphatase: 81 IU/L (ref 39–117)
BUN/Creatinine Ratio: 12 (ref 9–20)
BUN: 11 mg/dL (ref 6–24)
CO2: 25 mmol/L (ref 18–29)
Calcium: 9.2 mg/dL (ref 8.7–10.2)
Chloride: 103 mmol/L (ref 97–108)
Creatinine, Ser: 0.91 mg/dL (ref 0.76–1.27)
GFR calc Af Amer: 109 mL/min/{1.73_m2} (ref >59)
GFR calc non Af Amer: 94 mL/min/{1.73_m2} (ref >59)
Globulin, Total: 2.4 g/dL (ref 1.5–4.5)
Glucose: 107 mg/dL — ABNORMAL HIGH (ref 65–99)
Potassium: 3.6 mmol/L (ref 3.5–5.2)
Sodium: 142 mmol/L (ref 134–144)
Total Bilirubin: 0.2 mg/dL (ref 0.0–1.2)
Total Protein: 6.1 g/dL (ref 6.0–8.5)

## 2012-10-13 LAB — NMR, LIPOPROFILE
Cholesterol: 154 mg/dL (ref <200)
HDL Cholesterol by NMR: 48 mg/dL (ref >=40)
HDL Particle Number: 32.1 umol/L (ref >=30.5)
LDL Particle Number: 1214 nmol/L — ABNORMAL HIGH (ref <1000)
LDL Size: 20.8 nm (ref >20.5)
LDLC SERPL CALC-MCNC: 84 mg/dL (ref <100)
LP-IR Score: 52 — ABNORMAL HIGH (ref <=45)
Small LDL Particle Number: 521 nmol/L (ref <=527)
Triglycerides by NMR: 109 mg/dL (ref <150)

## 2012-10-13 LAB — H PYLORI, IGM, IGG, IGA AB
H Pylori IgG: <0.9 U/mL (ref 0.0–0.8)
H. pylori, IgA Abs: <9 units (ref 0.0–8.9)
H. pylori, IgM Abs: <9 units (ref 0.0–8.9)

## 2012-10-13 LAB — TSH: TSH: 0.607 u[IU]/mL (ref 0.450–4.500)

## 2012-10-15 NOTE — Progress Notes (Signed)
Quick Note:  Lab result at or very close to goal. No change in Medications for now. No Change in plans and follow up. ______

## 2012-10-17 ENCOUNTER — Ambulatory Visit (INDEPENDENT_AMBULATORY_CARE_PROVIDER_SITE_OTHER): Payer: Medicare Other | Admitting: General Practice

## 2012-10-17 ENCOUNTER — Telehealth: Payer: Self-pay | Admitting: Family Medicine

## 2012-10-17 ENCOUNTER — Encounter: Payer: Self-pay | Admitting: General Practice

## 2012-10-17 VITALS — BP 148/86 | HR 82 | Temp 98.9°F | Ht 69.0 in | Wt 219.0 lb

## 2012-10-17 DIAGNOSIS — J322 Chronic ethmoidal sinusitis: Secondary | ICD-10-CM

## 2012-10-17 MED ORDER — FLUTICASONE PROPIONATE 50 MCG/ACT NA SUSP: 2.0000 | Freq: Every day | NASAL | Status: DC

## 2012-10-17 MED ORDER — AZITHROMYCIN 250 MG PO TABS: ORAL_TABLET | ORAL | Status: DC

## 2012-10-17 NOTE — Telephone Encounter (Signed)
Spoke with pt and per dr Jacelyn Grip --he will need to be seen by provider

## 2012-10-17 NOTE — Progress Notes (Signed)
  Subjective:    Patient ID: Brad Mendoza, male    DOB: 1955-08-01, 57 y.o.   MRN: [id number]  Sinusitis This is a new problem. The current episode started 1 to 4 weeks ago. The problem has been gradually worsening since onset. There has been no fever. Associated symptoms include congestion, coughing, a hoarse voice, sinus pressure and a sore throat. Pertinent negatives include no chills, headaches or shortness of breath. Past treatments include nothing.      Review of Systems  Constitutional: Negative for fever and chills.  HENT: Positive for congestion, sore throat, hoarse voice, postnasal drip and sinus pressure.   Respiratory: Positive for cough. Negative for chest tightness and shortness of breath.   Cardiovascular: Negative for chest pain.  Neurological: Negative for dizziness and headaches.       Objective:   Physical Exam  Constitutional: He is oriented to person, place, and time. He appears well-developed and well-nourished.  HENT:  Nose: Right sinus exhibits maxillary sinus tenderness and frontal sinus tenderness. Left sinus exhibits maxillary sinus tenderness and frontal sinus tenderness.  Mouth/Throat: Posterior oropharyngeal erythema present.  Cardiovascular: Normal rate, regular rhythm and normal heart sounds.   Pulmonary/Chest: Effort normal and breath sounds normal.  Neurological: He is alert and oriented to person, place, and time.  Skin: Skin is warm and dry.  Psychiatric: He has a normal mood and affect.          Assessment & Plan:  1. Ethmoid sinusitis - azithromycin (ZITHROMAX) 250 MG tablet; Take as directed  Dispense: 6 tablet; Refill: 0 - fluticasone (FLONASE) 50 MCG/ACT nasal spray; Place 2 sprays into the nose daily.  Dispense: 16 g; Refill: 6 -increase fluids -RTO if symptoms worsen or unresolved -Patient verbalized understanding -Erby Pian, FNP-C

## 2012-11-09 ENCOUNTER — Ambulatory Visit (INDEPENDENT_AMBULATORY_CARE_PROVIDER_SITE_OTHER): Payer: Medicare Other | Admitting: Family Medicine

## 2012-11-09 ENCOUNTER — Encounter: Payer: Self-pay | Admitting: Family Medicine

## 2012-11-09 VITALS — BP 154/74 | HR 84 | Temp 98.6°F | Wt 219.0 lb

## 2012-11-09 DIAGNOSIS — Z23 Encounter for immunization: Secondary | ICD-10-CM | POA: Diagnosis not present

## 2012-11-09 DIAGNOSIS — R079 Chest pain, unspecified: Secondary | ICD-10-CM

## 2012-11-09 DIAGNOSIS — I10 Essential (primary) hypertension: Secondary | ICD-10-CM

## 2012-11-09 DIAGNOSIS — E119 Type 2 diabetes mellitus without complications: Secondary | ICD-10-CM

## 2012-11-09 DIAGNOSIS — I2510 Atherosclerotic heart disease of native coronary artery without angina pectoris: Secondary | ICD-10-CM

## 2012-11-09 DIAGNOSIS — I428 Other cardiomyopathies: Secondary | ICD-10-CM

## 2012-11-09 DIAGNOSIS — E785 Hyperlipidemia, unspecified: Secondary | ICD-10-CM

## 2012-11-09 MED ORDER — LISINOPRIL 40 MG PO TABS: 40.0000 mg | ORAL_TABLET | Freq: Every day | ORAL | Status: DC
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
## 2012-11-09 NOTE — Progress Notes (Signed)
Pt tolerated pneumonia injection well

## 2012-11-09 NOTE — Patient Instructions (Addendum)
Dr Paula Libra Recommendations  For nutrition information, I recommend books:  1).Eat to Live by Dr Excell Seltzer. 2).Prevent and Reverse Heart Disease by Dr Karl Luke. 3) Dr Janene Harvey Book:  Program to Reverse Diabetes  Exercise recommendations are:  If unable to walk, then the patient can exercise in a chair 3 times a day. By flapping arms like a bird gently and raising legs outwards to the front.  If ambulatory, the patient can go for walks for 30 minutes 3 times a week. Then increase the intensity and duration as tolerated.  Goal is to try to attain exercise frequency to 5 times a week.  If applicable: Best to perform resistance exercises (machines or weights) 2 days a week and cardio type exercises 3 days per week.    Pneumococcal Vaccine, Polyvalent solution for injection What is this medicine? PNEUMOCOCCAL VACCINE, POLYVALENT (NEU mo KOK al vak SEEN, pol ee VEY luhnt) is a vaccine to prevent pneumococcus bacteria infection. These bacteria are a major cause of ear infections, Strep throat infections, and serious pneumonia, meningitis, or blood infections worldwide. These vaccines help the body to produce antibodies (protective substances) that help your body defend against these bacteria. This vaccine is recommended for people 56 years of age and older with health problems. It is also recommended for all adults over 56 years old. This vaccine will not treat an infection. This medicine may be used for other purposes; ask your health care provider or pharmacist if you have questions. What should I tell my health care provider before I take this medicine? They need to know if you have any of these conditions: -bleeding problems -bone marrow or organ transplant -cancer, Hodgkin's disease -fever -infection -immune system problems -low platelet count in the blood -seizures -an unusual or allergic reaction to pneumococcal vaccine, diphtheria toxoid, other  vaccines, latex, other medicines, foods, dyes, or preservatives -pregnant or trying to get pregnant -breast-feeding How should I use this medicine? This vaccine is for injection into a muscle or under the skin. It is given by a health care professional. A copy of Vaccine Information Statements will be given before each vaccination. Read this sheet carefully each time. The sheet may change frequently. Talk to your pediatrician regarding the use of this medicine in children. While this drug may be prescribed for children as young as 56 years of age for selected conditions, precautions do apply. Overdosage: If you think you have taken too much of this medicine contact a poison control center or emergency room at once. NOTE: This medicine is only for you. Do not share this medicine with others. What if I miss a dose? It is important not to miss your dose. Call your doctor or health care professional if you are unable to keep an appointment. What may interact with this medicine? -medicines for cancer chemotherapy -medicines that suppress your immune function -medicines that treat or prevent blood clots like warfarin, enoxaparin, and dalteparin -steroid medicines like prednisone or cortisone This list may not describe all possible interactions. Give your health care provider a list of all the medicines, herbs, non-prescription drugs, or dietary supplements you use. Also tell them if you smoke, drink alcohol, or use illegal drugs. Some items may interact with your medicine. What should I watch for while using this medicine? Mild fever and pain should go away in 3 days or less. Report any unusual symptoms to your doctor or health care professional. What side effects may I notice  from receiving this medicine? Side effects that you should report to your doctor or health care professional as soon as possible: -allergic reactions like skin rash, itching or hives, swelling of the face, lips, or  tongue -breathing problems -confused -fever over 102 degrees F -pain, tingling, numbness in the hands or feet -seizures -unusual bleeding or bruising -unusual muscle weakness Side effects that usually do not require medical attention (report to your doctor or health care professional if they continue or are bothersome): -aches and pains -diarrhea -fever of 102 degrees F or less -headache -irritable -loss of appetite -pain, tender at site where injected -trouble sleeping This list may not describe all possible side effects. Call your doctor for medical advice about side effects. You may report side effects to FDA at 1-800-FDA-1088. Where should I keep my medicine? This does not apply. This vaccine is given in a clinic, pharmacy, doctor's office, or other health care setting and will not be stored at home. NOTE: This sheet is a summary. It may not cover all possible information. If you have questions about this medicine, talk to your doctor, pharmacist, or health care provider.  2013, Elsevier/[employer]. (09/14/2007 2:32:37 PM) Jacqulyn Bath neumocccica (Pneumococcal Pneumonia) La neumona es una infeccin de los pulmones y la causa de esta enfermedad son pequeos (microscpicos) organismos, como las bacterias y los virus. El streptococcus pneumoniae (tambin conocido como neumococo, neumona estreptococcica y neumona neumocccica) es la bacteria que causa neumona en adultos y nios. Esta infeccin no es contagiosa (no se disemina de persona a Nurse, learning disability). El organismo se encuentra en la nariz y la garganta de adultos y nios sanos, pero no causa infecciones en estos sitios. La neumona se produce cuando el neumococo es aspirado hasta las partes ms profundas de los pulmones (tracto respiratorio). En [employer], especialmente los muy jvenes o los [place], y los que estn debilitados, la neumona puede aparecer luego de una gripe o hasta de un resfro comn. Algunas personas tambin  pueden contraer neumona permaneciendo en un hospital por otras enfermedades.  SNTOMAS Los sntomas ms frecuentes son tos, fiebre, dolor en el pecho, aumento de la frecuencia respiratoria, respiracin dificultosa y produccin de esputos.  DIAGNSTICO  El mdico diagnostica la neumona por medio de:   El anlisis de los sntomas.   La auscultacin de los pulmones con un estetoscopio.   Radiografas de trax   El diagnstico de la neumona neumocccica requiere un cultivo de esputo o de [place].  TRATAMIENTO La neumona neumoccica generalmente responde bien a los antibiticos. Los pacientes pueden ser tratados en el hospital o en su casa, segn su estado general.  Vacunacin: Se dispone de Augusto Gamble antineumoccica para prevenir esta causa comn de neumona bacteriana y sus complicaciones potenciales. En los adultos, se recomienda para los mayores de 63 aos y en los grupos de alto riesgo de adquirir la infeccin. Aqu se incluyen:  Personas con problemas pulmonares o respiratorios (como fumadores, o personas que sufren asma, enfisema y bronquitis crnica).   Personas que reciben quimioterapia.   Los que sufren trastornos en el sistema inmunolgico.  Le indicarn la vacuna antineumoccica como parte del tratamiento u hospitalizacin. Si recibe tratamiento en el hospital:  [employer] prescribir medicamentos por va oral o por va intravenosa. En [employer], los medicamentos se administran por la vena.   Pueden administrarle oxgeno y lquidos por va intravenosa.   Le controlarn la frecuencia cardaca y respiratoria.   Le tomarn radiografas para observar la evolucin de la enfermedad.  Le realizarn un estudio para Hydrographic surveyor neumococos o recibir la vacuna si tiene ms de 73 aos y no est inmunizado.   El profesional que lo Special educational needs teacher pruebas (como gases en sangre o una oximetra de pulso) para Academic librarian correcto funcionamiento de los pulmones.   Si es  fumador, es el momento de abandonar el hbito. Recibir instrucciones para facilitarle la decisin de dejar de fumar. Los profesionales pueden proporcionarle medicamentos y asesoramiento para ayudarlo.   Comenzar a sentirse mejor despus de 2  3 das de antibiticos. Si no tiene [employer], se sentir bastante mejor despus de una semana aproximadamente. Si tiene ms de 60 aos o tiene otros problemas mdicos, puede llevarle ms [place].  INSTRUCCIONES PARA EL CUIDADO DOMICILIARIO  Descanse, pero qudese levantado todo el tiempo que pueda. [place] profesional relacionadas con el regreso a las [place].   Beba ms lquidos (agua, t o jugos de frutas) [employer] para ayudarlo a [employer].   Los inhibidores de la tos pueden utilizarse si no [employer] Sin embargo, la tos nos protege limpiando los pulmones. Esta es una razn por la que no debe abusar de los inhibidores de la tos.   La tos puede empeorar durante la noche. Duerma en posicin semisentado en Yvetta Coder o use un par de almohadas debajo de la cabeza.   Tome todos los medicamentos prescriptos hasta terminarlos.   El mdico tambin pueden indicarle un expectorante para aflojar el [place] secreciones siempre que pueda.   Solo tome medicamentos que se pueden comprar sin receta o recetados para Conservation officer, historic buildings, Tree surgeon o fiebre, como le indica el mdico.   El fumar es una de las causas ms frecuentes de bronquitis y puede contribuir a la neumona. Terminar con este hbito es un paso importante que usted puede dar para usted mismo.   Un vaporizador o humidificador con vapor fro en la habitacin o en la casa puede ayudar a aflojar la mucosidad.   Descanse cuando lo necesite. El [employer] har saber cuando tiene que descansar.   Si es fumador y Airline pilot, la tos puede durar varias semanas despus que la neumona haya desaparecido.   Use una almohadilla trmica en  baja potencia para aliviar las [employer]. No duerma con la almohadilla trmica.   Comunquese con su mdico si siente que empeora o si no se siente mejor en 2  3 das.  PREVENCIN  Deje de fumar. El fumar demora la curacin.   Use una mscara cuando limpie zonas con polvo o moho y cuando est con personas que pueden sufrir una infeccin. Evite las visitas a personas que puedan estar infectadas aunque la neumona no es contagiosa.   Consulte a su mdico cules son las vacunas que debe recibir. Esto es muy importante si usted:   Se encuentra en un establecimiento en el que recibe cuidados, como un hogar de ancianos.   Governor [place] con muchas personas alrededor.   Tiene problemas respiratorios.   Tiene comprometido su sistema inmunolgico y est bajo tratamiento con quimioterapia o sufre sida.   La vacuna antineumoccica (Pneumovax, Prevnar) previene contra infecciones por neumococo y complicaciones de esta infeccin.   La vacuna contra la gripe previene la neumona y otras infecciones causadas por el virus de la influenza. Debe aplicarse todos los aos para estar protegido contra nuevas cepas virales. Debe aplicrsela en el otoo, cuando se encuentre disponible.   La vacuna Hib evita  la neumona en los nios provocada por el Haemophilus Influenzae tipo b. Si los nios estn vacunados, se evitar que diseminen en el hogar las infecciones,  SOLICITE ATENCIN MDICA DE INMEDIATO SI:  Observa un esputo ms similar al pus (purulento), no puede controlar la fiebre o la enfermedad [place]. Esto vale especialmente en el caso de que usted sea una persona mayor o se encuentre dbil por otra enfermedad.   No puede controlar la tos con inhibidores y no puede dormir bien.   Comienza a escupir sangre al toser.   Siente dolor y ste [place] , o no puede controlarlo con medicamentos, o Tree surgeon en el pecho que empeora al toser o Ambulance person.   La temperatura oral se eleva sin motivo por  arriba de 102 F (38.9 C) despus de no tener fiebre durante uno o ms das.   Cualquiera de los sntomas que inicialmente lo trajeron a Museum/gallery conservator en vez de Teacher, English as a foreign language, o siente dificultad para respirar o Tourist information centre manager.  Document Released: 12/04/2008 Document Revised: 10/20/2010 [facility] Patient Information 2012 [place].

## 2012-12-04 ENCOUNTER — Other Ambulatory Visit: Payer: Self-pay

## 2012-12-04 MED ORDER — ESOMEPRAZOLE MAGNESIUM 40 MG PO CPDR: 40.0000 mg | DELAYED_RELEASE_CAPSULE | Freq: Every day | ORAL | Status: DC

## 2012-12-06 ENCOUNTER — Encounter: Payer: Self-pay | Admitting: Cardiovascular Disease

## 2012-12-06 ENCOUNTER — Encounter: Payer: Self-pay | Admitting: *Deleted

## 2012-12-06 ENCOUNTER — Other Ambulatory Visit: Payer: Self-pay | Admitting: *Deleted

## 2012-12-06 ENCOUNTER — Ambulatory Visit (INDEPENDENT_AMBULATORY_CARE_PROVIDER_SITE_OTHER): Payer: Medicare Other | Admitting: Cardiovascular Disease

## 2012-12-06 VITALS — BP 146/75 | HR 84 | Ht 69.0 in | Wt 218.0 lb

## 2012-12-06 DIAGNOSIS — I2510 Atherosclerotic heart disease of native coronary artery without angina pectoris: Secondary | ICD-10-CM | POA: Diagnosis not present

## 2012-12-06 DIAGNOSIS — I5022 Chronic systolic (congestive) heart failure: Secondary | ICD-10-CM

## 2012-12-06 DIAGNOSIS — I428 Other cardiomyopathies: Secondary | ICD-10-CM | POA: Diagnosis not present

## 2012-12-06 DIAGNOSIS — I209 Angina pectoris, unspecified: Secondary | ICD-10-CM

## 2012-12-06 DIAGNOSIS — I10 Essential (primary) hypertension: Secondary | ICD-10-CM | POA: Diagnosis not present

## 2012-12-06 DIAGNOSIS — E785 Hyperlipidemia, unspecified: Secondary | ICD-10-CM

## 2012-12-06 DIAGNOSIS — I429 Cardiomyopathy, unspecified: Secondary | ICD-10-CM

## 2012-12-06 MED ORDER — ISOSORBIDE MONONITRATE ER 30 MG PO TB24: 30.0000 mg | ORAL_TABLET | Freq: Every day | ORAL | Status: DC

## 2012-12-06 MED ORDER — CARVEDILOL 6.25 MG PO TABS: 6.2500 mg | ORAL_TABLET | Freq: Two times a day (BID) | ORAL | Status: DC

## 2012-12-06 NOTE — Patient Instructions (Signed)
Your physician has requested that you have an echocardiogram. Echocardiography is a painless test that uses sound waves to create images of your heart. It provides your doctor with information about the size and shape of your heart and how well your heart's chambers and valves are working. This procedure takes approximately one hour. There are no restrictions for this procedure. Your physician has requested that you have a lexiscan myoview. For further information please visit [email or web address]. Please follow instruction sheet, as given. Office will contact with results via phone or letter.   Increase Coreg to 6.25mg  twice a day   Begin Imdur 30mg  daily  New prescriptions sent to pharmacy for above medications Continue all other medications.   Follow up in  3-4 weeks

## 2012-12-06 NOTE — Progress Notes (Signed)
Patient ID: Brad Mendoza, male   DOB: 1955/06/14, 57 y.o.   MRN: [id number]      SUBJECTIVE: The patient is a 57 year old male with a history of nonischemic cardiomyopathy status post prior cardiac catheterization in 2005, and was noted to have a 50% LAD lesion. Ejection fraction is reportedly 30-35% by echocardiogram performed in September 2011. He underwent an exercise Cardiolite study on 12-09-2010, which revealed a fixed defect in the basal inferior region with an ejection fraction of 27%. The patient was treated medically and placed on a regimen of Coreg, lisinopril, aspirin, high-dose Lipitor and when necessary nitroglycerin. He is no longer on Lipitor and now on Crestor. He also has HTN, diabetes, and hyperlipidemia, along with a reported h/o polysubstance abuse.  About a month ago while doing strenuous yardwork, he experienced 10 minutes of chest pain. It was relieved with one SL nitro. Later that night, he experienced it again. He had some associated dizziness but denies syncope. He's uncertain if it was accompanied by diaphoresis. He denies associated shortness of breath. He denies leg swelling. He had palpitations about a month ago as well, but not on the same day he had the chest pain.  The pain is described as "sharp" and "twinge-like". He then describes subsequent "twinges" which occur intermittently, possibly twice a week.  He's done yardwork since then without difficulty.     Allergies  Allergen Reactions  . Sulfa Antibiotics Shortness Of Breath  . Penicillins Anxiety    REACTION: rash    Current Outpatient Prescriptions  Medication Sig Dispense Refill  . aspirin EC 81 MG tablet Take 81 mg by mouth daily.      . B-D INS SYRINGE 0.5CC/30GX1/2" 30G X 1/2" 0.5 ML MISC       . carvedilol (COREG) 3.125 MG tablet Take 1 tablet (3.125 mg total) by mouth 2 (two) times daily.  60 tablet  4  . desloratadine (CLARINEX) 5 MG tablet Take 1 tablet (5 mg total) by mouth daily.  30  tablet  3  . esomeprazole (NEXIUM) 40 MG capsule Take 1 capsule (40 mg total) by mouth daily before breakfast.  30 capsule  4  . fluticasone (FLONASE) 50 MCG/ACT nasal spray Place 2 sprays into the nose daily.  16 g  6  . furosemide (LASIX) 40 MG tablet Take 40 mg by mouth daily.      [name] glucose blood (ACCU-CHEK AVIVA PLUS) test strip Use as instructed  100 each  0  . insulin NPH (HUMULIN N,NOVOLIN N) 100 UNIT/ML injection Inject 40 Units into the skin at bedtime.  1 vial  5  . insulin regular (NOVOLIN R,HUMULIN R) 100 units/mL injection Inject 0.2 mLs (20 Units total) into the skin every morning.  10 mL  5  . lisinopril (PRINIVIL,ZESTRIL) 40 MG tablet Take 1 tablet (40 mg total) by mouth daily.  30 tablet  5  . metFORMIN (GLUCOPHAGE) 1000 MG tablet Take 1,000 mg by mouth daily with breakfast.      . Multiple Vitamins-Minerals (CENTRUM SILVER ADULT 50+ PO) Take 1 tablet by mouth daily.      . nitroGLYCERIN (NITROSTAT) 0.4 MG SL tablet Place 0.4 mg under the tongue every 5 (five) minutes as needed.        [name] omega-3 fish oil (MAXEPA) 1000 MG CAPS capsule Take 2 capsules (2,000 mg total) by mouth 2 (two) times daily.  90 each    . rosuvastatin (CRESTOR) 10 MG tablet Take 1 tablet (10 mg total)  by mouth daily.  30 tablet  3   No current facility-administered medications for this visit.    Past Medical History  Diagnosis Date  . CHF (congestive heart failure)   . HTN (hypertension)   . Acid reflux   . Onychomycosis   . Back pain     f4 and f5  . Stroke   . Diverticulosis   . Hyperlipidemia   . Diabetes mellitus 2005    Past Surgical History  Procedure Laterality Date  . Appendectomy      History   Social History  . Marital Status: Married    Spouse Name: N/A    Number of Children: N/A  . Years of Education: N/A   Occupational History  . Not on file.   Social History Main Topics  . Smoking status: Never Smoker   . Smokeless tobacco: Current User    Types: Snuff      Comment: dips snuff about 6 cans/week for 3 years  . Alcohol Use: Yes     Comment: occ/social  . Drug Use: Yes     Comment: admits to some sub abuse- 02-10-12 ov  . Sexual Activity: Not on file   Other Topics Concern  . Not on file   Social History Narrative  . No narrative on file     Filed Vitals:   12/06/12 0900  BP: 146/75  Pulse: 84  Height: 5\' 9"  (1.753 m)  Weight: 218 lb (98.884 kg)    PHYSICAL EXAM General: NAD Neck: No JVD, no thyromegaly or thyroid nodule.  Lungs: Clear to auscultation bilaterally with normal respiratory effort. CV: Nondisplaced PMI.  Heart regular S1/S2, no S3/S4, no murmur.  No peripheral edema.  No carotid bruit.  Normal pedal pulses.  Abdomen: Soft, nontender, no hepatosplenomegaly, no distention.  Neurologic: Alert and oriented x 3.  Psych: Normal affect. Extremities: No clubbing or cyanosis.   ECG: reviewed and available in electronic records.      ASSESSMENT AND PLAN: 1. Angina in the setting of known CAD (50% prior LAD lesion): given the recent bouts of chest pain, I wonder if his LAD lesion has progressed to the point of hemodynamic significance. For more optimal medical management, I will increase Coreg to 6.25 mg bid and add Imdur 30 mg daily. I will also proceed with a Lexiscan Cardiolite stress test to evaluate for inducible ischemia. I will obtain an echocardiogram to assess for wall motion abnormalities and to reassess his LV systolic function. Continue ASA. 2. HTN: relatively well controlled. 3. Hyperlipidemia: labs reviewed. On Crestor. 4. Chronic systolic heart failure: appears to be well compensated on current medical therapy.  Dispo: f/u in 3-4 weeks.  Kate Sable, M.D., F.A.C.C.

## 2012-12-13 ENCOUNTER — Other Ambulatory Visit: Payer: Self-pay

## 2012-12-13 ENCOUNTER — Other Ambulatory Visit (INDEPENDENT_AMBULATORY_CARE_PROVIDER_SITE_OTHER): Payer: Medicare Other

## 2012-12-13 DIAGNOSIS — I209 Angina pectoris, unspecified: Secondary | ICD-10-CM

## 2012-12-13 DIAGNOSIS — I428 Other cardiomyopathies: Secondary | ICD-10-CM | POA: Diagnosis not present

## 2012-12-13 DIAGNOSIS — I2510 Atherosclerotic heart disease of native coronary artery without angina pectoris: Secondary | ICD-10-CM

## 2012-12-13 DIAGNOSIS — R079 Chest pain, unspecified: Secondary | ICD-10-CM | POA: Diagnosis not present

## 2012-12-13 DIAGNOSIS — I429 Cardiomyopathy, unspecified: Secondary | ICD-10-CM

## 2012-12-17 DIAGNOSIS — I2109 ST elevation (STEMI) myocardial infarction involving other coronary artery of anterior wall: Secondary | ICD-10-CM | POA: Diagnosis not present

## 2012-12-17 DIAGNOSIS — I2510 Atherosclerotic heart disease of native coronary artery without angina pectoris: Secondary | ICD-10-CM | POA: Diagnosis not present

## 2012-12-17 DIAGNOSIS — I209 Angina pectoris, unspecified: Secondary | ICD-10-CM | POA: Diagnosis not present

## 2012-12-17 DIAGNOSIS — R0789 Other chest pain: Secondary | ICD-10-CM | POA: Diagnosis not present

## 2012-12-17 DIAGNOSIS — I428 Other cardiomyopathies: Secondary | ICD-10-CM | POA: Diagnosis not present

## 2013-01-04 ENCOUNTER — Ambulatory Visit: Payer: Medicare Other | Admitting: Cardiovascular Disease

## 2013-01-22 ENCOUNTER — Ambulatory Visit: Payer: Medicare Other | Admitting: Cardiovascular Disease

## 2013-01-28 ENCOUNTER — Ambulatory Visit (INDEPENDENT_AMBULATORY_CARE_PROVIDER_SITE_OTHER): Payer: Medicare Other | Admitting: Cardiovascular Disease

## 2013-01-28 ENCOUNTER — Encounter: Payer: Self-pay | Admitting: Cardiovascular Disease

## 2013-01-28 VITALS — BP 165/77 | HR 99 | Ht 69.0 in | Wt 209.0 lb

## 2013-01-28 DIAGNOSIS — I429 Cardiomyopathy, unspecified: Secondary | ICD-10-CM

## 2013-01-28 DIAGNOSIS — I209 Angina pectoris, unspecified: Secondary | ICD-10-CM | POA: Diagnosis not present

## 2013-01-28 DIAGNOSIS — I428 Other cardiomyopathies: Secondary | ICD-10-CM | POA: Diagnosis not present

## 2013-01-28 DIAGNOSIS — I2510 Atherosclerotic heart disease of native coronary artery without angina pectoris: Secondary | ICD-10-CM | POA: Diagnosis not present

## 2013-01-28 DIAGNOSIS — I10 Essential (primary) hypertension: Secondary | ICD-10-CM

## 2013-01-28 DIAGNOSIS — E785 Hyperlipidemia, unspecified: Secondary | ICD-10-CM

## 2013-01-28 DIAGNOSIS — I5022 Chronic systolic (congestive) heart failure: Secondary | ICD-10-CM

## 2013-01-28 MED ORDER — CARVEDILOL 12.5 MG PO TABS: 12.5000 mg | ORAL_TABLET | Freq: Two times a day (BID) | ORAL | Status: DC

## 2013-01-28 NOTE — Patient Instructions (Signed)
   Increase Coreg to 12.5mg  twice a day  - new sent to pharm  Please make sure you are taking Imdur 30mg  daily Continue all other medications.    Your physician wants you to follow up in: 6 months.  You will receive a reminder letter in the mail one-two months in advance.  If you don't receive a letter, please call our office to schedule the follow up appointment

## 2013-01-28 NOTE — Progress Notes (Signed)
Patient ID: Brad Mendoza, male   DOB: Feb 01, 1956, 57 y.o.   MRN: [id number]      SUBJECTIVE: The patient is a 57 year old male with a history of nonischemic cardiomyopathy status post prior cardiac catheterization in 2005, and was noted to have a 50% LAD lesion. A stress test at that time showed no ischemia with severely reduced systolic function. An echo showed an EF of 30-35% in 10/2009. The patient was treated medically and placed on a regimen of Coreg, lisinopril, aspirin, high-dose Lipitor and when necessary nitroglycerin.  He also has HTN, diabetes, and hyperlipidemia, along with a reported h/o polysubstance abuse.   This past September, while doing strenuous yardwork, he experienced 10 minutes of chest pain. It was relieved with one SL nitro. Later that night, he experienced it again. He had some associated dizziness but denies syncope. He's uncertain if it was accompanied by diaphoresis. He denies associated shortness of breath. He denies leg swelling. He had palpitations in September as well, but not on the same day he had the chest pain.  The pain was described as "sharp" and "twinge-like". He then described subsequent "twinges" which had occurred intermittently, possibly twice a week.   He's done yardwork since then without difficulty, and has been raking leaves for the past 7 weeks without chest pain or shortness of breath. While he increased his Coreg dose as instructed in 11/2012, he doesn't think he got the Imdur prescription filled. He continues to have episodic "chest twinges" lasting seconds.   The patient underwent a nuclear stress test which showed anterior and anteroseptal infarcts, with a calculated LVEF of 33% with wall motion abnormalities. There was no mention of ischemia on the report. Echo showed improvement in LV systolic function to [id number] (previously 30-35% in 10/2009).    Allergies  Allergen Reactions  . Sulfa Antibiotics Shortness Of Breath  . Penicillins Anxiety     REACTION: rash    Current Outpatient Prescriptions  Medication Sig Dispense Refill  . aspirin EC 81 MG tablet Take 81 mg by mouth daily.      . B-D INS SYRINGE 0.5CC/30GX1/2" 30G X 1/2" 0.5 ML MISC       . carvedilol (COREG) 6.25 MG tablet Take 1 tablet (6.25 mg total) by mouth 2 (two) times daily.  60 tablet  6  . desloratadine (CLARINEX) 5 MG tablet Take 1 tablet (5 mg total) by mouth daily.  30 tablet  3  . esomeprazole (NEXIUM) 40 MG capsule Take 1 capsule (40 mg total) by mouth daily before breakfast.  30 capsule  4  . fluticasone (FLONASE) 50 MCG/ACT nasal spray Place 2 sprays into the nose daily.  16 g  6  . furosemide (LASIX) 40 MG tablet Take 40 mg by mouth daily.      [name] glucose blood (ACCU-CHEK AVIVA PLUS) test strip Use as instructed  100 each  0  . insulin NPH (HUMULIN N,NOVOLIN N) 100 UNIT/ML injection Inject 40 Units into the skin at bedtime.  1 vial  5  . insulin regular (NOVOLIN R,HUMULIN R) 100 units/mL injection Inject 0.2 mLs (20 Units total) into the skin every morning.  10 mL  5  . isosorbide mononitrate (IMDUR) 30 MG 24 hr tablet Take 1 tablet (30 mg total) by mouth daily.  30 tablet  6  . lisinopril (PRINIVIL,ZESTRIL) 40 MG tablet Take 1 tablet (40 mg total) by mouth daily.  30 tablet  5  . metFORMIN (GLUCOPHAGE) 1000 MG tablet Take 1,000 mg  by mouth daily with breakfast.      . Multiple Vitamins-Minerals (CENTRUM SILVER ADULT 50+ PO) Take 1 tablet by mouth daily.      . nitroGLYCERIN (NITROSTAT) 0.4 MG SL tablet Place 0.4 mg under the tongue every 5 (five) minutes as needed.        [name] omega-3 fish oil (MAXEPA) 1000 MG CAPS capsule Take 2 capsules (2,000 mg total) by mouth 2 (two) times daily.  90 each    . rosuvastatin (CRESTOR) 10 MG tablet Take 1 tablet (10 mg total) by mouth daily.  30 tablet  3   No current facility-administered medications for this visit.    Past Medical History  Diagnosis Date  . CHF (congestive heart failure)   . HTN (hypertension)   .  Acid reflux   . Onychomycosis   . Back pain     f4 and f5  . Stroke   . Diverticulosis   . Hyperlipidemia   . Diabetes mellitus 2005    Past Surgical History  Procedure Laterality Date  . Appendectomy      History   Social History  . Marital Status: Married    Spouse Name: N/A    Number of Children: N/A  . Years of Education: N/A   Occupational History  . Not on file.   Social History Main Topics  . Smoking status: Never Smoker   . Smokeless tobacco: Current User    Types: Snuff     Comment: dips snuff about 6 cans/week for 3 years  . Alcohol Use: Yes     Comment: occ/social  . Drug Use: Yes     Comment: admits to some sub abuse- 02-10-12 ov  . Sexual Activity: Not on file   Other Topics Concern  . Not on file   Social History Narrative  . No narrative on file     Filed Vitals:   01/28/13 1035  BP: 165/77  Pulse: 99  Height: 5\' 9"  (1.753 m)  Weight: 209 lb (94.802 kg)  SpO2: 97%    PHYSICAL EXAM General: NAD Neck: No JVD, no thyromegaly or thyroid nodule.  Lungs: Clear to auscultation bilaterally with normal respiratory effort. CV: Nondisplaced PMI.  Heart regular S1/S2, no S3/S4, no murmur.  No peripheral edema.  No carotid bruit.  Normal pedal pulses.  Abdomen: Soft, nontender, no hepatosplenomegaly, no distention.  Neurologic: Alert and oriented x 3.  Psych: Normal affect. Extremities: No clubbing or cyanosis.   ECG: reviewed and available in electronic records.  - Left ventricle: The cavity size was normal. Wall thickness was normal. Systolic function was mildly reduced. The estimated ejection fraction was in the range of 40% to 45%. Doppler parameters are consistent with abnormal left ventricular relaxation (grade 1 diastolic dysfunction). - Aortic valve: Valve area: 2.74cm^2(VTI). Valve area: 2.94cm^2 (Vmax). - Mitral valve: Calcified annulus. - Left atrium: The atrium was moderately dilated. - Right atrium: The atrium was mildly  dilated. - Pulmonary arteries: Systolic pressure was moderately increased. PA peak pressure: 52mm Hg (S).      ASSESSMENT AND PLAN: 1. Angina in the setting of known CAD (50% prior LAD lesion): stress test showed only infarct, without ischemia. He continues to experience chest pains on occasion, but only lasting seconds. I increased Coreg to 6.25 mg bid and added Imdur 30 mg daily at his last visit, but he does not think he obtained the Imdur. I encouraged him to obtain the Imdur and I will increase Coreg to 12.5 mg  bid. Continue ASA.  2. HTN: uncontrolled today. Increase Coreg to 12.5 mg bid. 3. Hyperlipidemia: labs reviewed. On Crestor.  4. Chronic systolic heart failure: appears to be well compensated on current medical therapy. EF improved to 40-45%.   Kate Sable, M.D., F.A.C.C.

## 2013-01-31 ENCOUNTER — Other Ambulatory Visit: Payer: Self-pay | Admitting: *Deleted

## 2013-01-31 DIAGNOSIS — J302 Other seasonal allergic rhinitis: Secondary | ICD-10-CM

## 2013-01-31 NOTE — Telephone Encounter (Signed)
Last ov 11/09/12.

## 2013-02-01 MED ORDER — "INSULIN SYRINGE-NEEDLE U-100 30G X 1/2"" 0.5 ML MISC": Status: DC

## 2013-02-01 MED ORDER — DESLORATADINE 5 MG PO TABS: 5.0000 mg | ORAL_TABLET | Freq: Every day | ORAL | Status: DC

## 2013-02-01 MED ORDER — FUROSEMIDE 40 MG PO TABS: 40.0000 mg | ORAL_TABLET | Freq: Every day | ORAL | Status: DC

## 2013-02-01 NOTE — Telephone Encounter (Signed)
Call patient : Prescription refilled & sent to pharmacy in [facility]. 

## 2013-02-08 ENCOUNTER — Ambulatory Visit (INDEPENDENT_AMBULATORY_CARE_PROVIDER_SITE_OTHER): Payer: Medicare Other | Admitting: Family Medicine

## 2013-02-08 ENCOUNTER — Encounter: Payer: Self-pay | Admitting: Family Medicine

## 2013-02-08 VITALS — BP 147/88 | HR 88 | Temp 97.6°F | Ht 69.0 in | Wt 212.4 lb

## 2013-02-08 DIAGNOSIS — E119 Type 2 diabetes mellitus without complications: Secondary | ICD-10-CM

## 2013-02-08 DIAGNOSIS — E785 Hyperlipidemia, unspecified: Secondary | ICD-10-CM | POA: Diagnosis not present

## 2013-02-08 DIAGNOSIS — I10 Essential (primary) hypertension: Secondary | ICD-10-CM | POA: Diagnosis not present

## 2013-02-08 DIAGNOSIS — J302 Other seasonal allergic rhinitis: Secondary | ICD-10-CM

## 2013-02-08 DIAGNOSIS — I2510 Atherosclerotic heart disease of native coronary artery without angina pectoris: Secondary | ICD-10-CM | POA: Diagnosis not present

## 2013-02-08 DIAGNOSIS — F1910 Other psychoactive substance abuse, uncomplicated: Secondary | ICD-10-CM

## 2013-02-08 DIAGNOSIS — J309 Allergic rhinitis, unspecified: Secondary | ICD-10-CM

## 2013-02-08 DIAGNOSIS — B351 Tinea unguium: Secondary | ICD-10-CM

## 2013-02-08 DIAGNOSIS — I428 Other cardiomyopathies: Secondary | ICD-10-CM

## 2013-02-08 DIAGNOSIS — F17200 Nicotine dependence, unspecified, uncomplicated: Secondary | ICD-10-CM

## 2013-02-08 DIAGNOSIS — E669 Obesity, unspecified: Secondary | ICD-10-CM

## 2013-02-08 DIAGNOSIS — K219 Gastro-esophageal reflux disease without esophagitis: Secondary | ICD-10-CM

## 2013-02-08 LAB — GLUCOSE, POCT (MANUAL RESULT ENTRY): POC Glucose: 383 mg/dl — AB (ref 70–99)

## 2013-02-08 LAB — POCT UA - MICROALBUMIN: Microalbumin Ur, POC: POSITIVE mg/L

## 2013-02-08 LAB — POCT GLYCOSYLATED HEMOGLOBIN (HGB A1C): Hemoglobin A1C: >14

## 2013-02-08 MED ORDER — NITROGLYCERIN 0.4 MG SL SUBL: 0.4000 mg | SUBLINGUAL_TABLET | SUBLINGUAL | Status: DC | PRN

## 2013-02-08 NOTE — Patient Instructions (Signed)
    Dr Linah Klapper's Recommendations  For nutrition information, I recommend books:  1).Eat to Live by Dr Joel Fuhrman. 2).Prevent and Reverse Heart Disease by Dr Caldwell Esselstyn. 3) Dr Neal Barnard's Book:  Program to Reverse Diabetes  Exercise recommendations are:  If unable to walk, then the patient can exercise in a chair 3 times a day. By flapping arms like a bird gently and raising legs outwards to the front.  If ambulatory, the patient can go for walks for 30 minutes 3 times a week. Then increase the intensity and duration as tolerated.  Goal is to try to attain exercise frequency to 5 times a week.  If applicable: Best to perform resistance exercises (machines or weights) 2 days a week and cardio type exercises 3 days per week.   Diabetes and Foot Care Diabetes may cause you to have problems because of poor blood supply (circulation) to your feet and legs. This may cause the skin on your feet to become thinner, break easier, and heal more slowly. Your skin may become dry, and the skin may peel and crack. You may also have nerve damage in your legs and feet causing decreased feeling in them. You may not notice minor injuries to your feet that could lead to infections or more serious problems. Taking care of your feet is one of the most important things you can do for yourself.  HOME CARE INSTRUCTIONS  Wear shoes at all times, even in the house. Do not go barefoot. Bare feet are easily injured.  Check your feet daily for blisters, cuts, and redness. If you cannot see the bottom of your feet, use a mirror or ask someone for help.  Wash your feet with warm water (do not use hot water) and mild soap. Then pat your feet and the areas between your toes until they are completely dry. Do not soak your feet as this can dry your skin.  Apply a moisturizing lotion or petroleum jelly (that does not contain alcohol and is unscented) to the skin on your feet and to dry, brittle toenails.  Do not apply lotion between your toes.  Trim your toenails straight across. Do not dig under them or around the cuticle. File the edges of your nails with an emery board or nail file.  Do not cut corns or calluses or try to remove them with medicine.  Wear clean socks or stockings every day. Make sure they are not too tight. Do not wear knee-high stockings since they may decrease blood flow to your legs.  Wear shoes that fit properly and have enough cushioning. To break in new shoes, wear them for just a few hours a day. This prevents you from injuring your feet. Always look in your shoes before you put them on to be sure there are no objects inside.  Do not cross your legs. This may decrease the blood flow to your feet.  If you find a minor scrape, cut, or break in the skin on your feet, keep it and the skin around it clean and dry. These areas may be cleansed with mild soap and water. Do not cleanse the area with peroxide, alcohol, or iodine.  When you remove an adhesive bandage, be sure not to damage the skin around it.  If you have a wound, look at it several times a day to make sure it is healing.  Do not use heating pads or hot water bottles. They may burn your skin. If   you have lost feeling in your feet or legs, you may not know it is happening until it is too late.  Make sure your health care provider performs a complete foot exam at least annually or more often if you have foot problems. Report any cuts, sores, or bruises to your health care provider immediately. SEEK MEDICAL CARE IF:   You have an injury that is not healing.  You have cuts or breaks in the skin.  You have an ingrown nail.  You notice redness on your legs or feet.  You feel burning or tingling in your legs or feet.  You have pain or cramps in your legs and feet.  Your legs or feet are numb.  Your feet always feel cold. SEEK IMMEDIATE MEDICAL CARE IF:   There is increasing redness, swelling, or pain in  or around a wound.  There is a red line that goes up your leg.  Pus is coming from a wound.  You develop a fever or as directed by your health care provider.  You notice a bad smell coming from an ulcer or wound. Document Released: 02/05/2000 Document Revised: 10/10/2012 Document Reviewed: 07/17/2012 [employer] Patient Information 2014 [employer].  

## 2013-02-08 NOTE — Addendum Note (Signed)
Addended by: Pollyann Kennedy F on: 02/08/2013 01:45 PM   Modules accepted: Orders

## 2013-02-08 NOTE — Progress Notes (Signed)
Patient ID: Brad Mendoza, male   DOB: 1955-07-23, 57 y.o.   MRN: [id number] SUBJECTIVE: CC: Chief Complaint  Patient presents with  . Hypertension  . Hyperlipidemia  . Coronary Artery Disease  . Diabetes    HPI: Saw cardiologist last week.doing well no new complaints.  Patient is here for follow up of Diabetes Mellitus/CAD: Symptoms evaluated: Denies Nocturia ,Denies Urinary Frequency , denies Blurred vision ,deniesDizziness,denies.Dysuria,denies paresthesias, denies extremity pain or ulcers.Marland Kitchendenies chest pain. has had an annual eye exam. do check the feet. Does check CBGs. Average CBG:a little high eats a lot of bread. Denies episodes of hypoglycemia. Does have an emergency hypoglycemic plan. admits toCompliance with medications. Denies Problems with medications.  Uses snuff   Past Medical History  Diagnosis Date  . CHF (congestive heart failure)   . HTN (hypertension)   . Acid reflux   . Onychomycosis   . Back pain     f4 and f5  . Stroke   . Diverticulosis   . Hyperlipidemia   . Diabetes mellitus 2005   Past Surgical History  Procedure Laterality Date  . Appendectomy     History   Social History  . Marital Status: Married    Spouse Name: N/A    Number of Children: N/A  . Years of Education: N/A   Occupational History  . Not on file.   Social History Main Topics  . Smoking status: Never Smoker   . Smokeless tobacco: Current User    Types: Snuff     Comment: dips snuff about 6 cans/week for 3 years  . Alcohol Use: Yes     Comment: occ/social  . Drug Use: Yes     Comment: admits to some sub abuse- 02-10-12 ov  . Sexual Activity: Not on file   Other Topics Concern  . Not on file   Social History Narrative  . No narrative on file   Family History  Problem Relation Age of Onset  . Hypertension Father   . Diabetes Father   . Cancer Mother   . Alcohol abuse Brother    Current Outpatient Prescriptions on File Prior to Visit  Medication Sig  Dispense Refill  . aspirin EC 81 MG tablet Take 81 mg by mouth daily.      . carvedilol (COREG) 12.5 MG tablet Take 1 tablet (12.5 mg total) by mouth 2 (two) times daily.  60 tablet  6  . desloratadine (CLARINEX) 5 MG tablet Take 1 tablet (5 mg total) by mouth daily.  30 tablet  3  . esomeprazole (NEXIUM) 40 MG capsule Take 1 capsule (40 mg total) by mouth daily before breakfast.  30 capsule  4  . fluticasone (FLONASE) 50 MCG/ACT nasal spray Place 2 sprays into the nose daily.  16 g  6  . furosemide (LASIX) 40 MG tablet Take 1 tablet (40 mg total) by mouth daily.  30 tablet  3  . glucose blood (ACCU-CHEK AVIVA PLUS) test strip Use as instructed  100 each  0  . insulin NPH (HUMULIN N,NOVOLIN N) 100 UNIT/ML injection Inject 40 Units into the skin at bedtime.  1 vial  5  . insulin regular (NOVOLIN R,HUMULIN R) 100 units/mL injection Inject 0.2 mLs (20 Units total) into the skin every morning.  10 mL  5  . Insulin Syringe-Needle U-100 (B-D INS SYRINGE 0.5CC/30GX1/2") 30G X 1/2" 0.5 ML MISC Use as directed  100 each  0  . isosorbide mononitrate (IMDUR) 30 MG 24 hr tablet Take  1 tablet (30 mg total) by mouth daily.  30 tablet  6  . lisinopril (PRINIVIL,ZESTRIL) 40 MG tablet Take 1 tablet (40 mg total) by mouth daily.  30 tablet  5  . metFORMIN (GLUCOPHAGE) 1000 MG tablet Take 1,000 mg by mouth daily with breakfast.      . Multiple Vitamins-Minerals (CENTRUM SILVER ADULT 50+ PO) Take 1 tablet by mouth daily.      [name] omega-3 fish oil (MAXEPA) 1000 MG CAPS capsule Take 2 capsules (2,000 mg total) by mouth 2 (two) times daily.  90 each    . rosuvastatin (CRESTOR) 10 MG tablet Take 1 tablet (10 mg total) by mouth daily.  30 tablet  3   No current facility-administered medications on file prior to visit.   Allergies  Allergen Reactions  . Sulfa Antibiotics Shortness Of Breath  . Penicillins Anxiety    REACTION: rash   Immunization History  Administered Date(s) Administered  . Pneumococcal  Polysaccharide-23 11/09/2012  . Tdap 07/26/2012   Prior to Admission medications   Medication Sig Start Date End Date Taking? Authorizing Provider  aspirin EC 81 MG tablet Take 81 mg by mouth daily.   Yes Historical Provider, MD  carvedilol (COREG) 12.5 MG tablet Take 1 tablet (12.5 mg total) by mouth 2 (two) times daily. 01/28/13  Yes Herminio Commons, MD  desloratadine (CLARINEX) 5 MG tablet Take 1 tablet (5 mg total) by mouth daily. 01/31/13  Yes Vernie Shanks, MD  esomeprazole (NEXIUM) 40 MG capsule Take 1 capsule (40 mg total) by mouth daily before breakfast. 12/04/12  Yes Vernie Shanks, MD  fluticasone [facility]) 50 MCG/ACT nasal spray Place 2 sprays into the nose daily. 10/17/12  Yes Mae Loree Fee, FNP  furosemide (LASIX) 40 MG tablet Take 1 tablet (40 mg total) by mouth daily. 01/31/13  Yes Vernie Shanks, MD  glucose blood (ACCU-CHEK AVIVA PLUS) test strip Use as instructed 05/17/12  Yes Gearlean Alf, PA-C  insulin NPH (HUMULIN N,NOVOLIN N) 100 UNIT/ML injection Inject 40 Units into the skin at bedtime. 10/12/12  Yes Vernie Shanks, MD  insulin regular (NOVOLIN R,HUMULIN R) 100 units/mL injection Inject 0.2 mLs (20 Units total) into the skin every morning. 10/12/12  Yes Vernie Shanks, MD  Insulin Syringe-Needle U-100 (B-D INS SYRINGE 0.5CC/30GX1/2") 30G X 1/2" 0.5 ML MISC Use as directed 01/31/13  Yes Vernie Shanks, MD  isosorbide mononitrate (IMDUR) 30 MG 24 hr tablet Take 1 tablet (30 mg total) by mouth daily. 12/06/12  Yes Herminio Commons, MD  lisinopril (PRINIVIL,ZESTRIL) 40 MG tablet Take 1 tablet (40 mg total) by mouth daily. 11/09/12  Yes Vernie Shanks, MD  metFORMIN (GLUCOPHAGE) 1000 MG tablet Take 1,000 mg by mouth daily with breakfast.   Yes Historical Provider, MD  Multiple Vitamins-Minerals (CENTRUM SILVER ADULT 50+ PO) Take 1 tablet by mouth daily.   Yes Historical Provider, MD  nitroGLYCERIN (NITROSTAT) 0.4 MG SL tablet Place 0.4 mg under the tongue every 5 (five)  minutes as needed.     Yes Historical Provider, MD  omega-3 fish oil (MAXEPA) 1000 MG CAPS capsule Take 2 capsules (2,000 mg total) by mouth 2 (two) times daily. 07/26/12  Yes Vernie Shanks, MD  rosuvastatin (CRESTOR) 10 MG tablet Take 1 tablet (10 mg total) by mouth daily. 07/24/12  Yes Vernie Shanks, MD     ROS: As above in the HPI. All other systems are stable or negative.  OBJECTIVE: APPEARANCE:  Patient  in no acute distress.The patient appeared well nourished and normally developed. Acyanotic. Waist: VITAL SIGNS:BP 147/88  Pulse 88  Temp(Src) 97.6 F (36.4 C) (Oral)  Ht 5\' 9"  (1.753 m)  Wt 212 lb 6.4 oz (96.344 kg)  BMI 31.35 kg/m2  Obese large buit AAM   SKIN: warm and  Dry without overt rashes, tattoos and scars  HEAD and Neck: without JVD, Head and scalp: normal Eyes:No scleral icterus. Fundi normal, eye movements normal. Ears: Auricle normal, canal normal, Tympanic membranes normal, insufflation normal. Nose: normal Throat: normal Neck & thyroid: normal  CHEST & LUNGS: Chest wall: normal Lungs: Clear  CVS: Reveals the PMI to be normally located. Regular rhythm, First and Second Heart sounds are normal,  absence of murmurs, rubs or gallops. Peripheral vasculature: Radial pulses: normal Dorsal pedis pulses: normal Posterior pulses: normal  ABDOMEN:  Appearance: normal Benign, no organomegaly, no masses, no Abdominal Aortic enlargement. No Guarding , no rebound. No Bruits. Bowel sounds: normal  RECTAL: N/A GU: N/A  EXTREMETIES: nonedematous.  MUSCULOSKELETAL:  Spine: normal Joints: intact  NEUROLOGIC: oriented to time,place and person; nonfocal. Strength is normal Sensory is normal Reflexes are normal Cranial Nerves are normal.   Results for orders placed in visit on 10/11/12  CMP14+EGFR      Result Value Range   Glucose 107 (*) 65 - 99 mg/dL   BUN 11  6 - 24 mg/dL   Creatinine, Ser 0.91  0.76 - 1.27 mg/dL   GFR calc non Af Amer 94  >59  mL/min/1.73   GFR calc Af Amer 109  >59 mL/min/1.73   BUN/Creatinine Ratio 12  9 - 20   Sodium 142  134 - 144 mmol/L   Potassium 3.6  3.5 - 5.2 mmol/L   Chloride 103  97 - 108 mmol/L   CO2 25  18 - 29 mmol/L   Calcium 9.2  8.7 - 10.2 mg/dL   Total Protein 6.1  6.0 - 8.5 g/dL   Albumin 3.7  3.5 - 5.5 g/dL   Globulin, Total 2.4  1.5 - 4.5 g/dL   Albumin/Globulin Ratio 1.5  1.1 - 2.5   Total Bilirubin 0.2  0.0 - 1.2 mg/dL   Alkaline Phosphatase 81  39 - 117 IU/L   AST 24  0 - 40 IU/L   ALT 20  0 - 44 IU/L  NMR, LIPOPROFILE      Result Value Range   LDL Particle Number [id number] (*) <1000 nmol/L   LDLC SERPL CALC-MCNC 84  <100 mg/dL   HDL Cholesterol by NMR 48  >=40 mg/dL   Triglycerides by NMR 109  <150 mg/dL   Cholesterol 154  <200 mg/dL   HDL Particle Number 32.1  >=30.5 umol/L   Small LDL Particle Number 521  <=527 nmol/L   LDL Size 20.8  >20.5 nm   LP-IR Score 52 (*) <=45  TSH      Result Value Range   TSH 0.607  0.450 - 4.500 uIU/mL  H PYLORI, IGM, IGG, IGA AB      Result Value Range   H Pylori IgG <0.9  0.0 - 0.8 U/mL   H. pylori, IgA Abs <9.0  0.0 - 8.9 units   H. pylori, IgM Abs <9.0  0.0 - 8.9 units  POCT CBC      Result Value Range   WBC 7.4  4.6 - 10.2 K/uL   Lymph, poc 2.0  0.6 - 3.4   POC LYMPH PERCENT 26.9  10 -  50 %L   MID (cbc)    0 - 0.9   POC MID %    0 - 12 %M   POC Granulocyte 5.2  2 - 6.9   Granulocyte percent 70.7  37 - 80 %G   RBC 5.1  4.69 - 6.13 M/uL   Hemoglobin 15.5  14.1 - 18.1 g/dL   HCT, POC 46.5  43.5 - 53.7 %   MCV 91.0  80 - 97 fL   MCH, POC 30.3  27 - 31.2 pg   MCHC 33.3  31.8 - 35.4 g/dL   RDW, POC 13.0     Platelet Count, POC 200.0  142 - 424 K/uL   MPV 7.6  0 - 99.8 fL  POCT GLYCOSYLATED HEMOGLOBIN (HGB A1C)      Result Value Range   Hemoglobin A1C 5.3 %    GLUCOSE, POCT (MANUAL RESULT ENTRY)      Result Value Range   POC Glucose 89  70 - 99 mg/dl    ASSESSMENT: HTN (hypertension) - Plan: CMP14+EGFR  HLD (hyperlipidemia) -  Plan: CMP14+EGFR, NMR, lipoprofile  Diabetes mellitus - Plan: POCT glycosylated hemoglobin (Hb A1C), POCT UA - Microalbumin, CMP14+EGFR  Coronary atherosclerosis of native coronary artery - Plan: nitroGLYCERIN (NITROSTAT) 0.4 MG SL tablet  Seasonal allergic rhinitis  Onychomycosis  Acid reflux  Polysubstance abuse  Obesity, unspecified  Nonischemic cardiomyopathy  NONDEPENDENT TOBACCO USE DISORDER  HYPERLIPIDEMIA  CORONARY ATHEROSCLEROSIS NATIVE CORONARY ARTERY   Doing fairly well and  Stable. Claims to  Be using the imdur as per cardiology.   PLAN:      Dr Paula Libra Recommendations  For nutrition information, I recommend books:  1).Eat to Live by Dr Excell Seltzer. 2).Prevent and Reverse Heart Disease by Dr Karl Luke. 3) Dr Janene Harvey Book:  Program to Reverse Diabetes  Exercise recommendations are:  If unable to walk, then the patient can exercise in a chair 3 times a day. By flapping arms like a bird gently and raising legs outwards to the front.  If ambulatory, the patient can go for walks for 30 minutes 3 times a week. Then increase the intensity and duration as tolerated.  Goal is to try to attain exercise frequency to 5 times a week.  If applicable: Best to perform resistance exercises (machines or weights) 2 days a week and cardio type exercises 3 days per week.  footcare in the AVS Orders Placed This Encounter  Procedures  . CMP14+EGFR  . NMR, lipoprofile  . POCT glycosylated hemoglobin (Hb A1C)  . POCT UA - Microalbumin   Meds ordered this encounter  Medications  . nitroGLYCERIN (NITROSTAT) 0.4 MG SL tablet    Sig: Place 1 tablet (0.4 mg total) under the tongue every 5 (five) minutes as needed.    Dispense:  30 tablet    Refill:  1   Medications Discontinued During This Encounter  Medication Reason  . nitroGLYCERIN (NITROSTAT) 0.4 MG SL tablet Reorder   Return in about 3 months (around 05/09/2013) for Recheck medical  problems.  Jonmichael Beadnell P. Jacelyn Grip, M.D.

## 2013-02-09 LAB — MICROALBUMIN, URINE: Microalbumin, Urine: 378.3 ug/mL — ABNORMAL HIGH (ref 0.0–17.0)

## 2013-02-10 LAB — CMP14+EGFR
ALT: 27 IU/L (ref 0–44)
AST: 19 IU/L (ref 0–40)
Albumin/Globulin Ratio: 1.6 (ref 1.1–2.5)
Albumin: 4.1 g/dL (ref 3.5–5.5)
Alkaline Phosphatase: 94 IU/L (ref 39–117)
BUN/Creatinine Ratio: 20 (ref 9–20)
BUN: 18 mg/dL (ref 6–24)
CO2: 21 mmol/L (ref 18–29)
Calcium: 9.7 mg/dL (ref 8.7–10.2)
Chloride: 95 mmol/L — ABNORMAL LOW (ref 97–108)
Creatinine, Ser: 0.89 mg/dL (ref 0.76–1.27)
GFR calc Af Amer: 110 mL/min/{1.73_m2} (ref >59)
GFR calc non Af Amer: 96 mL/min/{1.73_m2} (ref >59)
Globulin, Total: 2.6 g/dL (ref 1.5–4.5)
Glucose: 419 mg/dL (ref 65–99)
Potassium: 4.7 mmol/L (ref 3.5–5.2)
Sodium: 134 mmol/L (ref 134–144)
Total Bilirubin: <0.2 mg/dL (ref 0.0–1.2)
Total Protein: 6.7 g/dL (ref 6.0–8.5)

## 2013-02-10 LAB — NMR, LIPOPROFILE
Cholesterol: 228 mg/dL — ABNORMAL HIGH (ref <200)
HDL Cholesterol by NMR: 46 mg/dL (ref >=40)
HDL Particle Number: 38.1 umol/L (ref >=30.5)
LDL Particle Number: 2267 nmol/L — ABNORMAL HIGH (ref <1000)
LDL Size: 20.3 nm — ABNORMAL LOW (ref >20.5)
LDLC SERPL CALC-MCNC: 105 mg/dL — ABNORMAL HIGH (ref <100)
LP-IR Score: 63 — ABNORMAL HIGH (ref <=45)
Small LDL Particle Number: 1406 nmol/L — ABNORMAL HIGH (ref <=527)
Triglycerides by NMR: 384 mg/dL — ABNORMAL HIGH (ref <150)

## 2013-02-12 ENCOUNTER — Telehealth: Payer: Self-pay | Admitting: Family Medicine

## 2013-02-12 NOTE — Telephone Encounter (Signed)
Message copied by Ilean China on Tue Feb 12, 2013  4:09 PM ------      Message from: Vernie Shanks      Created: Sun Feb 10, 2013 10:24 AM       Labs which are not at goal:      DM is uncontrolled      Lipids are too high            Will need to make adjustments:            Recommend the following:       Needs an early  appointment with Tammy to adjust insulin and review medications and diet.                          ------

## 2013-02-18 NOTE — Telephone Encounter (Signed)
Pt notfied and results given to pt

## 2013-03-04 ENCOUNTER — Ambulatory Visit: Payer: Medicare Other

## 2013-03-14 ENCOUNTER — Encounter: Payer: Self-pay | Admitting: Pharmacist

## 2013-03-14 ENCOUNTER — Ambulatory Visit (INDEPENDENT_AMBULATORY_CARE_PROVIDER_SITE_OTHER): Payer: Medicare Other | Admitting: Pharmacist

## 2013-03-14 VITALS — BP 140/80 | HR 74 | Ht 69.0 in | Wt 213.5 lb

## 2013-03-14 DIAGNOSIS — E119 Type 2 diabetes mellitus without complications: Secondary | ICD-10-CM | POA: Diagnosis not present

## 2013-03-14 MED ORDER — INSULIN DETEMIR 100 UNIT/ML ~~LOC~~ SOLN: SUBCUTANEOUS | Status: DC

## 2013-03-14 NOTE — Patient Instructions (Addendum)
Stop Insulin N (NPH) Start Levemir 40 units once at bedtime.    Regular Insulin dose - sliding scale. Check Blood glucose prior to each meal and given insulin according to below  Less than 80 - no insulin  80 to 120 - 8units  121- 200 - 10 units  201 - 250 - 12 units  250 - 300 - 15 units  301 - 350 - 18 units  351 or above - 20 units     Hypoglycemia (Low Blood Sugar) Hypoglycemia is when the glucose (sugar) in your blood is too low. Hypoglycemia can happen for many reasons. It can happen to people with or without diabetes. Hypoglycemia can develop quickly and can be a medical emergency.  CAUSES  Having hypoglycemia does not mean that you will develop diabetes. Different causes include:  Missed or delayed meals or not enough carbohydrates eaten.  Medication overdose. This could be by accident or deliberate. If by accident, your medication may need to be adjusted or changed.  Exercise or increased activity without adjustments in carbohydrates or medications.  A nerve disorder that affects body functions like your heart rate, blood pressure and digestion (autonomic neuropathy).  A condition where the stomach muscles do not function properly (gastroparesis). Therefore, medications may not absorb properly.  The inability to recognize the signs of hypoglycemia (hypoglycemic unawareness).  Absorption of insulin  may be altered.  Alcohol consumption.  Pregnancy/menstrual cycles/postpartum. This may be due to hormones.  Certain kinds of tumors. This is very rare. SYMPTOMS   Sweating.  Hunger.  Dizziness.  Blurred vision.  Drowsiness.  Weakness.  Headache.  Rapid heart beat.  Shakiness.  Nervousness. DIAGNOSIS  Diagnosis is made by monitoring blood glucose in one or all of the following ways:  Fingerstick blood glucose monitoring.  Laboratory results. TREATMENT  If you think your blood glucose is low:  Check your blood glucose, if possible. If it is less  than 70 mg/dl, take one of the following:  3-4 glucose tablets.   cup juice (prefer clear like apple).   cup "regular" soda pop.  1 cup milk.  -1 tube of glucose gel.  5-6 hard candies.  Do not over treat because your blood glucose (sugar) will only go too high.  Wait 15 minutes and recheck your blood glucose. If it is still less than 70 mg/dl (or below your target range), repeat treatment.  Eat a snack if it is more than one hour until your next meal. Sometimes, your blood glucose may go so low that you are unable to treat yourself. You may need someone to help you. You may even pass out or be unable to swallow. This may require you to get an injection of glucagon, which raises the blood glucose. HOME CARE INSTRUCTIONS  Check blood glucose as recommended by your caregiver.  Take medication as prescribed by your caregiver.  Follow your meal plan. Do not skip meals. Eat on time.  If you are going to drink alcohol, drink it only with meals.  Check your blood glucose before driving.  Check your blood glucose before and after exercise. If you exercise longer or different than usual, be sure to check blood glucose more frequently.  Always carry treatment with you. Glucose tablets are the easiest to carry.  Always wear medical alert jewelry or carry some form of identification that states that you have diabetes. This will alert people that you have diabetes. If you have hypoglycemia, they will have a better idea on  what to do. SEEK MEDICAL CARE IF:   You are having problems keeping your blood sugar at target range.  You are having frequent episodes of hypoglycemia.  You feel you might be having side effects from your medicines.  You have symptoms of an illness that is not improving after 3-4 days.  You notice a change in vision or a new problem with your vision. SEEK IMMEDIATE MEDICAL CARE IF:   You are a family member or friend of a person whose blood glucose goes  below 70 mg/dl and is accompanied by:  Confusion.  A change in mental status.  The inability to swallow.  Passing out. Document Released: 02/07/2005 Document Revised: 05/02/2011 Document Reviewed: 06/06/2011 [facility] Patient Information 2014 [place], [place].

## 2013-03-14 NOTE — Progress Notes (Signed)
Diabetes Visit  Chief Complaint:   Chief Complaint  Patient presents with  . Diabetes     Filed Vitals:   03/14/13 1310  BP: 140/80  Pulse: 74   Filed Weights   03/14/13 1310  Weight: 213 lb 8 oz (96.843 kg)   Body mass index is 31.51 kg/(m^2).  HPI: patient with uncontrolled DM diagnosed around 2007.   Current Diabetes Medications:  Metfromin 1000mg  BID, NPH insulin 40 units daily and Regular insulin per sliding scale (patinet could not remember and didn't bring in copy.  I was unable to locate SS in chart) Also did not bring in glucometer but reports HBG ranging from 69 (occurred when he skipped a meal and was working outside) to 500's  Low fat/carbohydrate diet?  No Nicotine Abuse?  No Medication Compliance?  Yes Exercise?  No Alcohol use?  Yes  Exam Edema:  negative  Polyuria:  positive  Polydipsia:  negative Polyphagia:  negataive  BMI:  Body mass index is 31.51 kg/(m^2).   Weight changes:  none General Appearance:  alert, oriented, no acute distress and obese Mood/Affect:  normal         Lab Results  Component Value Date   HGBA1C >14 02/08/2013     Assessment: Diabetes.  Uncontrolled - not following CHO counting diet  Recommendations: 1.  Medication recommendations at this time are as follows:    Change NPH to Levemir 40 units at bedtime  Regular Insulin dose - sliding scale. Check Blood glucose prior to each meal and given insulin according to below  Less than 80 - no insulin  80 to 120 - 8units  121- 200 - 10 units  201 - 250 - 12 units  250 - 300 - 15 units  301 - 350 - 18 units  351 or above - 20 units   2.  Reviewed HBG goals:  Fasting 80-130 and 1-2 hour post prandial <180.  Patient is instructed to check BG 4 times per day.    3.  Dietary recommendations:  Discussed CHO counting , ADA diet in depth 4.  Physical Activity recommendations:  Increase physical activity 5.  Return to clinic in 2-3 weeks   Time spent counseling patient:  30  minutes   Cherre Robins, PharmD, CPP

## 2013-04-08 ENCOUNTER — Ambulatory Visit: Payer: Self-pay

## 2013-04-25 ENCOUNTER — Ambulatory Visit (INDEPENDENT_AMBULATORY_CARE_PROVIDER_SITE_OTHER): Payer: Medicare Other | Admitting: Nurse Practitioner

## 2013-04-25 ENCOUNTER — Encounter: Payer: Self-pay | Admitting: Nurse Practitioner

## 2013-04-25 VITALS — BP 138/88 | HR 82 | Temp 97.4°F | Ht 69.0 in | Wt 214.0 lb

## 2013-04-25 DIAGNOSIS — J019 Acute sinusitis, unspecified: Secondary | ICD-10-CM | POA: Diagnosis not present

## 2013-04-25 MED ORDER — NOREL AD 4-10-325 MG PO TABS: 1.0000 | ORAL_TABLET | Freq: Three times a day (TID) | ORAL | Status: DC

## 2013-04-25 MED ORDER — AZITHROMYCIN 250 MG PO TABS: ORAL_TABLET | ORAL | Status: DC

## 2013-04-25 NOTE — Progress Notes (Signed)
Subjective:    Patient ID: Brad Mendoza, male    DOB: [birth year], 57 y.o.   MRN: [id number]  HPI Patient in tday c/o cough and congestion that started last Friday- Has nasal spray given by Dr. Jacelyn Grip that has not helped- developed a constant headache 2 days ago.    Review of Systems  Constitutional: Negative for fever, chills and appetite change.  HENT: Positive for congestion, ear pain (popping), rhinorrhea, sinus pressure and sore throat (slight). Negative for trouble swallowing and voice change.   Respiratory: Positive for cough (productive).   Cardiovascular: Negative.   Gastrointestinal: Negative.   Neurological: Positive for dizziness and headaches.  All other systems reviewed and are negative.       Objective:   Physical Exam  Constitutional: He is oriented to person, place, and time. He appears well-developed and well-nourished.  HENT:  Right Ear: Hearing, tympanic membrane, external ear and ear canal normal.  Left Ear: Hearing, tympanic membrane, external ear and ear canal normal.  Nose: Mucosal edema and rhinorrhea present. Right sinus exhibits maxillary sinus tenderness. Right sinus exhibits no frontal sinus tenderness. Left sinus exhibits maxillary sinus tenderness. Left sinus exhibits no frontal sinus tenderness.  Mouth/Throat: Uvula is midline, oropharynx is clear and moist and mucous membranes are normal.  Eyes: Pupils are equal, round, and reactive to light.  Neck: Normal range of motion. Neck supple.  Cardiovascular: Normal rate, regular rhythm and normal heart sounds.   Pulmonary/Chest: Effort normal and breath sounds normal.  Abdominal: Soft. Bowel sounds are normal.  Lymphadenopathy:    He has no cervical adenopathy.  Neurological: He is alert and oriented to person, place, and time.  Skin: Skin is warm and dry.  Psychiatric: He has a normal mood and affect. His behavior is normal. Judgment and thought content normal.    BP 138/88  Pulse 82  Temp(Src)  97.4 F (36.3 C) (Oral)  Ht 5\' 9"  (1.753 m)  Wt 214 lb (97.07 kg)  BMI 31.59 kg/m2       Assessment & Plan:   1. Acute sinusitis    Meds ordered this encounter  Medications  . NOREL AD 4-10-325 MG TABS    Sig: Take 1 tablet by mouth 3 (three) times daily.    Dispense:  20 tablet    Refill:  0    Order Specific Question:  Supervising Provider    Answer:  Chipper Herb [[id number]]  . azithromycin (ZITHROMAX Z-PAK) 250 MG tablet    Sig: As directed    Dispense:  6 each    Refill:  0    Order Specific Question:  Supervising Provider    Answer:  Chipper Herb [[id number]]   Hold crestor until complete antibiotic 1. Take meds as prescribed 2. Use a cool mist humidifier especially during the winter months and when heat has been humid. 3. Use saline nose sprays frequently 4. Saline irrigations of the nose can be very helpful if done frequently.  * 4X daily for 1 week*  * Use of a nettie pot can be helpful with this. Follow directions with this* 5. Drink plenty of fluids 6. Keep thermostat turn down low 7.For any cough or congestion  Use plain Mucinex- regular strength or max strength is fine   * Children- consult with Pharmacist for dosing 8. For fever or aces or pains- take tylenol or ibuprofen appropriate for age and weight.  * for fevers greater than 101 orally you may alternate ibuprofen and tylenol  every  3 hours.   Mary-Margaret Hassell Done, FNP

## 2013-04-25 NOTE — Patient Instructions (Signed)

## 2013-05-07 ENCOUNTER — Emergency Department (HOSPITAL_COMMUNITY)
Admission: EM | Admit: 2013-05-07 | Discharge: 2013-05-07 | Disposition: A | Discharge status: Home / Self Care | Payer: Medicare Other | Attending: Emergency Medicine | Admitting: Emergency Medicine

## 2013-05-07 ENCOUNTER — Encounter (HOSPITAL_COMMUNITY): Payer: Self-pay | Admitting: Emergency Medicine

## 2013-05-07 ENCOUNTER — Emergency Department (HOSPITAL_COMMUNITY): Payer: Medicare Other

## 2013-05-07 DIAGNOSIS — Z7982 Long term (current) use of aspirin: Secondary | ICD-10-CM | POA: Diagnosis not present

## 2013-05-07 DIAGNOSIS — Z8619 Personal history of other infectious and parasitic diseases: Secondary | ICD-10-CM | POA: Diagnosis not present

## 2013-05-07 DIAGNOSIS — R1013 Epigastric pain: Secondary | ICD-10-CM | POA: Diagnosis not present

## 2013-05-07 DIAGNOSIS — N419 Inflammatory disease of prostate, unspecified: Secondary | ICD-10-CM | POA: Insufficient documentation

## 2013-05-07 DIAGNOSIS — Z794 Long term (current) use of insulin: Secondary | ICD-10-CM | POA: Insufficient documentation

## 2013-05-07 DIAGNOSIS — R300 Dysuria: Secondary | ICD-10-CM | POA: Insufficient documentation

## 2013-05-07 DIAGNOSIS — R209 Unspecified disturbances of skin sensation: Secondary | ICD-10-CM | POA: Diagnosis not present

## 2013-05-07 DIAGNOSIS — Z79899 Other long term (current) drug therapy: Secondary | ICD-10-CM | POA: Diagnosis not present

## 2013-05-07 DIAGNOSIS — I10 Essential (primary) hypertension: Secondary | ICD-10-CM | POA: Diagnosis not present

## 2013-05-07 DIAGNOSIS — N410 Acute prostatitis: Secondary | ICD-10-CM | POA: Diagnosis not present

## 2013-05-07 DIAGNOSIS — K219 Gastro-esophageal reflux disease without esophagitis: Secondary | ICD-10-CM | POA: Insufficient documentation

## 2013-05-07 DIAGNOSIS — Z792 Long term (current) use of antibiotics: Secondary | ICD-10-CM | POA: Insufficient documentation

## 2013-05-07 DIAGNOSIS — E119 Type 2 diabetes mellitus without complications: Secondary | ICD-10-CM | POA: Diagnosis not present

## 2013-05-07 DIAGNOSIS — R109 Unspecified abdominal pain: Secondary | ICD-10-CM | POA: Diagnosis not present

## 2013-05-07 DIAGNOSIS — R079 Chest pain, unspecified: Secondary | ICD-10-CM

## 2013-05-07 DIAGNOSIS — I509 Heart failure, unspecified: Secondary | ICD-10-CM | POA: Diagnosis not present

## 2013-05-07 DIAGNOSIS — R072 Precordial pain: Secondary | ICD-10-CM | POA: Insufficient documentation

## 2013-05-07 DIAGNOSIS — R202 Paresthesia of skin: Secondary | ICD-10-CM

## 2013-05-07 DIAGNOSIS — Z880 Allergy status to penicillin: Secondary | ICD-10-CM | POA: Diagnosis not present

## 2013-05-07 LAB — COMPREHENSIVE METABOLIC PANEL
ALBUMIN: 3.5 g/dL (ref 3.5–5.2)
ALK PHOS: 83 U/L (ref 39–117)
ALT: 22 U/L (ref 0–53)
AST: 23 U/L (ref 0–37)
BILIRUBIN TOTAL: 0.2 mg/dL — AB (ref 0.3–1.2)
BUN: 16 mg/dL (ref 6–23)
CHLORIDE: 101 meq/L (ref 96–112)
CO2: 29 mEq/L (ref 19–32)
Calcium: 9.6 mg/dL (ref 8.4–10.5)
Creatinine, Ser: 0.86 mg/dL (ref 0.50–1.35)
GFR calc Af Amer: >90 mL/min (ref >90)
GFR calc non Af Amer: >90 mL/min (ref >90)
GLUCOSE: 247 mg/dL — AB (ref 70–99)
Potassium: 4 mEq/L (ref 3.7–5.3)
SODIUM: 140 meq/L (ref 137–147)
TOTAL PROTEIN: 7.1 g/dL (ref 6.0–8.3)

## 2013-05-07 LAB — CBC WITH DIFFERENTIAL/PLATELET
BASOS ABS: 0 10*3/uL (ref 0.0–0.1)
BASOS PCT: 1 % (ref 0–1)
EOS PCT: 3 % (ref 0–5)
Eosinophils Absolute: 0.2 10*3/uL (ref 0.0–0.7)
HCT: 42.3 % (ref 39.0–52.0)
Hemoglobin: 14.3 g/dL (ref 13.0–17.0)
Lymphocytes Relative: 35 % (ref 12–46)
Lymphs Abs: 2.2 10*3/uL (ref 0.7–4.0)
MCH: 27.9 pg (ref 26.0–34.0)
MCHC: 33.8 g/dL (ref 30.0–36.0)
MCV: 82.6 fL (ref 78.0–100.0)
MONO ABS: 0.5 10*3/uL (ref 0.1–1.0)
Monocytes Relative: 8 % (ref 3–12)
Neutro Abs: 3.2 10*3/uL (ref 1.7–7.7)
Neutrophils Relative %: 53 % (ref 43–77)
PLATELETS: 216 10*3/uL (ref 150–400)
RBC: 5.12 MIL/uL (ref 4.22–5.81)
RDW: 12.7 % (ref 11.5–15.5)
WBC: 6.1 10*3/uL (ref 4.0–10.5)

## 2013-05-07 LAB — URINALYSIS, ROUTINE W REFLEX MICROSCOPIC
BILIRUBIN URINE: NEGATIVE
KETONES UR: NEGATIVE mg/dL
LEUKOCYTES UA: NEGATIVE
Nitrite: NEGATIVE
Protein, ur: 100 mg/dL — AB
Specific Gravity, Urine: >1.03 — ABNORMAL HIGH (ref 1.005–1.030)
Urobilinogen, UA: 0.2 mg/dL (ref 0.0–1.0)
pH: 5.5 (ref 5.0–8.0)

## 2013-05-07 LAB — URINE MICROSCOPIC-ADD ON

## 2013-05-07 LAB — I-STAT TROPONIN, ED: Troponin i, poc: 0.01 ng/mL (ref 0.00–0.08)

## 2013-05-07 LAB — LIPASE, BLOOD: LIPASE: 33 U/L (ref 11–59)

## 2013-05-07 MED ORDER — PANTOPRAZOLE SODIUM 40 MG IV SOLR
40.0000 mg | Freq: Once | INTRAVENOUS | Status: AC
  Administered 2013-05-07: 40 mg via INTRAVENOUS
  Filled 2013-05-07: qty 40

## 2013-05-07 MED ORDER — DOXYCYCLINE HYCLATE 100 MG PO CAPS: 100.0000 mg | ORAL_CAPSULE | Freq: Two times a day (BID) | ORAL | Status: DC

## 2013-05-07 MED ORDER — ONDANSETRON HCL 4 MG/2ML IJ SOLN
4.0000 mg | Freq: Once | INTRAMUSCULAR | Status: AC
  Administered 2013-05-07: 4 mg via INTRAVENOUS
  Filled 2013-05-07: qty 2

## 2013-05-07 NOTE — ED Provider Notes (Signed)
CSN: [id number]     Arrival date & time 05/07/13  [id number] History  This chart was scribed for Sharyon Cable, MD by Ludger Nutting, ED Scribe. This patient was seen in room APA14/APA14 and the patient's care was started 8:34 AM.    Chief Complaint  Patient presents with  . Chest Pain  . Abdominal Pain      Patient is a 58 y.o. male presenting with chest pain. The history is provided by the patient. No language interpreter was used.  Chest Pain Pain location:  Epigastric and substernal area Pain quality: tightness   Pain severity:  Mild Onset quality:  Gradual Duration:  2 weeks Timing:  Constant Progression:  Unchanged Chronicity:  New Relieved by:  None tried Associated symptoms: abdominal pain, back pain and numbness   Associated symptoms: not vomiting and no weakness   Risk factors: diabetes mellitus, high cholesterol and hypertension     HPI Comments: Brad Mendoza is a 58 y.o. male with past medical history of GERD, CHF, HTN, HLD, DM  who presents to the Emergency Department complaining of constant, waxing and waning, central chest pain that began 2 weeks ago. He describes this pain as tightness and states it is worse with laying down at night. He states it worsens around 10-11pm. He has associated abdominal pain that is described as burning. He also has associated intermittent tingling sensatations in the fingers and feet bilaterally. He has associated back pain between the shoulder blades which he states wakes him at night. He has associated dysuria. He reports sick contacts at home a few weeks ago. He denies vomiting, diarrhea, weakness.    Past Medical History  Diagnosis Date  . CHF (congestive heart failure)   . HTN (hypertension)   . Acid reflux   . Onychomycosis   . Back pain     f4 and f5  . Diverticulosis   . Hyperlipidemia   . Diabetes mellitus 2005   Past Surgical History  Procedure Laterality Date  . Appendectomy     Family History  Problem Relation Age  of Onset  . Hypertension Father   . Diabetes Father   . Cancer Mother   . Alcohol abuse Brother    History  Substance Use Topics  . Smoking status: Never Smoker   . Smokeless tobacco: Current User    Types: Snuff     Comment: dips snuff about 6 cans/week for 3 years  . Alcohol Use: Yes     Comment: occ/social    Review of Systems  Cardiovascular: Positive for chest pain.  Gastrointestinal: Positive for abdominal pain. Negative for vomiting.  Genitourinary: Positive for dysuria.  Musculoskeletal: Positive for back pain.  Neurological: Positive for numbness. Negative for weakness.  All other systems reviewed and are negative.      Allergies  Sulfa antibiotics and Penicillins  Home Medications   Current Outpatient Rx  Name  Route  Sig  Dispense  Refill  . aspirin EC 81 MG tablet   Oral   Take 81 mg by mouth daily.         [name] azithromycin (ZITHROMAX Z-PAK) 250 MG tablet      As directed   6 each   0   . carvedilol (COREG) 12.5 MG tablet   Oral   Take 1 tablet (12.5 mg total) by mouth 2 (two) times daily.   60 tablet   6     Dose increased 01/28/2013   . desloratadine (CLARINEX) 5 MG  tablet   Oral   Take 1 tablet (5 mg total) by mouth daily.   30 tablet   3   . esomeprazole (NEXIUM) 40 MG capsule   Oral   Take 1 capsule (40 mg total) by mouth daily before breakfast.   30 capsule   4   . fluticasone (FLONASE) 50 MCG/ACT nasal spray   Nasal   Place 2 sprays into the nose daily.   16 g   6   . furosemide (LASIX) 40 MG tablet   Oral   Take 1 tablet (40 mg total) by mouth daily.   30 tablet   3   . glucose blood (ACCU-CHEK AVIVA PLUS) test strip      Use as instructed   100 each   0     NTBS   . insulin detemir (LEVEMIR) 100 UNIT/ML injection      Inject 40 units at bedtime   20 mL   2     Replaced NPH insulin   . insulin regular (NOVOLIN R,HUMULIN R) 100 units/mL injection   Subcutaneous   Inject 0.2 mLs (20 Units total) into the  skin every morning.   10 mL   5   . Insulin Syringe-Needle U-100 (B-D INS SYRINGE 0.5CC/30GX1/2") 30G X 1/2" 0.5 ML MISC      Use as directed   100 each   0     Dx. 250.0   . isosorbide mononitrate (IMDUR) 30 MG 24 hr tablet   Oral   Take 1 tablet (30 mg total) by mouth daily.   30 tablet   6     New 12/06/2012   . lisinopril (PRINIVIL,ZESTRIL) 40 MG tablet   Oral   Take 1 tablet (40 mg total) by mouth daily.   30 tablet   5   . metFORMIN (GLUCOPHAGE) 1000 MG tablet   Oral   Take 1,000 mg by mouth daily with breakfast.         . Multiple Vitamins-Minerals (CENTRUM SILVER ADULT 50+ PO)   Oral   Take 1 tablet by mouth daily.         . nitroGLYCERIN (NITROSTAT) 0.4 MG SL tablet   Sublingual   Place 1 tablet (0.4 mg total) under the tongue every 5 (five) minutes as needed.   30 tablet   1   . NOREL AD 4-10-325 MG TABS   Oral   Take 1 tablet by mouth 3 (three) times daily.   20 tablet   0     Dispense as written.   [name] omega-3 fish oil (MAXEPA) 1000 MG CAPS capsule   Oral   Take 2 capsules (2,000 mg total) by mouth 2 (two) times daily.   90 each      . rosuvastatin (CRESTOR) 10 MG tablet   Oral   Take 1 tablet (10 mg total) by mouth daily.   30 tablet   3    BP 174/72  Pulse 84  Temp(Src) 98.3 F (36.8 C) (Oral)  Resp 22  SpO2 93% Physical Exam  Nursing note and vitals reviewed.  CONSTITUTIONAL: Well developed/well nourished HEAD: Normocephalic/atraumatic EYES: EOMI/PERRL ENMT: Mucous membranes moist NECK: supple no meningeal signs SPINE:entire spine nontender CV: S1/S2 noted, no murmurs/rubs/gallops noted LUNGS: Lungs are clear to auscultation bilaterally, no apparent distress ABDOMEN: soft, nontender, no rebound or guarding GU:no cva tenderness NEURO: Pt is awake/alert, moves all extremitiesx4, no arm or leg drift, no facial drift, no sensory deficit noted to extremities EXTREMITIES:  pulses normal, full ROM SKIN: warm, color  normal PSYCH: no abnormalities of mood noted   ED Course  Procedures   DIAGNOSTIC STUDIES: Oxygen Saturation is 93% on RA, adequate by my interpretation.    COORDINATION OF CARE: 8:39 AM Will order CXR, UA, CMP, CBC, Lipase, troponin. Discussed treatment plan with pt at bedside and pt agreed to plan.  9:25 AM Pt presents with multiple complaints CP constantly for 2 weeks - doubt ACS (ekg/troponin unremarkable) I doubt PE/Dissection Reports abd pain but none currently He reports diffuse paresthesias but no focal weakness and reports now improved.  Could be diabetic neuropathy I feel he is safe/stable for d/c home We discussed strict return precautions   Medications  pantoprazole (PROTONIX) injection 40 mg (not administered)       Labs Review Labs Reviewed  COMPREHENSIVE METABOLIC PANEL - Abnormal; Notable for the following:    Glucose, Bld 247 (*)    Total Bilirubin 0.2 (*)    All other components within normal limits  URINALYSIS, ROUTINE W REFLEX MICROSCOPIC - Abnormal; Notable for the following:    Specific Gravity, Urine >1.030 (*)    Glucose, UA >1000 (*)    Hgb urine dipstick SMALL (*)    Protein, ur 100 (*)    All other components within normal limits  CBC WITH DIFFERENTIAL  LIPASE, BLOOD  URINE MICROSCOPIC-ADD ON  Randolm Idol, ED   Imaging Review Dg Chest 2 View  05/07/2013   CLINICAL DATA:  Chest pain  EXAM: CHEST  2 VIEW  COMPARISON:  October 11, 2012.  FINDINGS: There is no edema or consolidation. Heart is upper normal in size with normal pulmonary vascularity. No adenopathy. No bone lesions.  IMPRESSION: No edema or consolidation.   Electronically Signed   By: Lowella Grip M.D.   On: 05/07/2013 09:01     EKG Interpretation   Date/Time:  Tuesday May 07 2013 08:01:58 EDT Ventricular Rate:  91 PR Interval:  166 QRS Duration: 80 QT Interval:  370 QTC Calculation: 455 R Axis:   -3 Text Interpretation:  Sinus rhythm with frequent Premature  ventricular  complexes Nonspecific T wave abnormality Abnormal ECG When compared with  ECG of 15-Apr-2010 11:28, Criteria for Inferior infarct are no longer  Present Confirmed by Christy Gentles  MD, Elenore Rota ([id number]) on 05/07/2013 8:09:07 AM      MDM   Final diagnoses:  Chest pain  Abdominal pain  Paresthesias    Nursing notes including past medical history and social history reviewed and considered in documentation xrays reviewed and considered Labs/vital reviewed and considered   I personally performed the services described in this documentation, which was scribed in my presence. The recorded information has been reviewed and is accurate.      Sharyon Cable, MD 05/07/13 [phone]

## 2013-05-07 NOTE — ED Provider Notes (Signed)
Pt now mentions rectal pain, nocturia, dysuria but denies penile discharge Rectal exam does not reveal abscess or large hemorrhoid, and he has no inguinal hernia or scrotal tenderness and no obvious penile discharge.   He did tenderness with exam, possible prostatitis Will start antibiotic   Pt reports mild nausea but otherwise well Pulse ox on repeat exam at 96%  Sharyon Cable, MD 05/07/13 [phone]

## 2013-05-07 NOTE — ED Notes (Signed)
Pt reports getting the flu two weeks ago and has been unable to recuperate.  Pt reports mid sternal chest pain and abd pain for the past 2 weeks as well.  Pt reports  Pain worsens at night when he lays down.  Pt reports tingling to his arms and legs bilaterally at night and sometimes throughout the day.

## 2013-05-07 NOTE — ED Notes (Signed)
Pt c/o intermittent chest pain x2-3 weeks. Pt describes pain as burning and tight. Pain is at worst at night when pt lays down. Pt also reports intermittent tingling in all extremities. Pt denies SOB.

## 2013-05-07 NOTE — Discharge Instructions (Signed)
Your caregiver has diagnosed you as having chest pain that is not specific for one problem, but does not require admission.  Chest pain comes from many different causes.  °SEEK IMMEDIATE MEDICAL ATTENTION IF: °You have severe chest pain, especially if the pain is crushing or pressure-like and spreads to the arms, back, neck, or jaw, or if you have sweating, nausea (feeling sick to your stomach), or shortness of breath. THIS IS AN EMERGENCY. Don't wait to see if the pain will go away. Get medical help at once. Call 911 or 0 (operator). DO NOT drive yourself to the hospital.  °Your chest pain gets worse and does not go away with rest.  °You have an attack of chest pain lasting longer than usual, despite rest and treatment with the medications your caregiver has prescribed.  °You wake from sleep with chest pain or shortness of breath.  °You feel dizzy or faint.  °You have chest pain not typical of your usual pain for which you originally saw your caregiver. ° °Abdominal (belly) pain can be caused by many things. any cases can be observed and treated at home after initial evaluation in the emergency department. Even though you are being discharged home, abdominal pain can be unpredictable. Therefore, you need a repeated exam if your pain does not resolve, returns, or worsens. Most patients with abdominal pain don't have to be admitted to the hospital or have surgery, but serious problems like appendicitis and gallbladder attacks can start out as nonspecific pain. Many abdominal conditions cannot be diagnosed in one visit, so follow-up evaluations are very important. °SEEK IMMEDIATE MEDICAL ATTENTION IF: °The pain does not go away or becomes severe, particularly over the next 8-12 hours.  °A temperature above 100.4F develops.  °Repeated vomiting occurs (multiple episodes).  °The pain becomes localized to portions of the abdomen. The right side could possibly be appendicitis. In an adult, the left lower portion of the  abdomen could be colitis or diverticulitis.  °Blood is being passed in stools or vomit (bright red or black tarry stools).  °Return also if you develop chest pain, difficulty breathing, dizziness or fainting, or become confused, poorly responsive, or inconsolable. ° °

## 2013-05-10 ENCOUNTER — Ambulatory Visit (INDEPENDENT_AMBULATORY_CARE_PROVIDER_SITE_OTHER): Payer: Medicare Other | Admitting: Family Medicine

## 2013-05-10 ENCOUNTER — Encounter: Payer: Self-pay | Admitting: Family Medicine

## 2013-05-10 VITALS — BP 166/75 | HR 70 | Temp 98.3°F | Ht 69.0 in | Wt 217.6 lb

## 2013-05-10 DIAGNOSIS — K6289 Other specified diseases of anus and rectum: Secondary | ICD-10-CM

## 2013-05-10 DIAGNOSIS — I2510 Atherosclerotic heart disease of native coronary artery without angina pectoris: Secondary | ICD-10-CM

## 2013-05-10 DIAGNOSIS — R300 Dysuria: Secondary | ICD-10-CM | POA: Diagnosis not present

## 2013-05-10 DIAGNOSIS — K3189 Other diseases of stomach and duodenum: Secondary | ICD-10-CM

## 2013-05-10 DIAGNOSIS — I10 Essential (primary) hypertension: Secondary | ICD-10-CM

## 2013-05-10 DIAGNOSIS — E119 Type 2 diabetes mellitus without complications: Secondary | ICD-10-CM | POA: Diagnosis not present

## 2013-05-10 DIAGNOSIS — F17200 Nicotine dependence, unspecified, uncomplicated: Secondary | ICD-10-CM | POA: Diagnosis not present

## 2013-05-10 DIAGNOSIS — I428 Other cardiomyopathies: Secondary | ICD-10-CM

## 2013-05-10 DIAGNOSIS — J302 Other seasonal allergic rhinitis: Secondary | ICD-10-CM

## 2013-05-10 DIAGNOSIS — E785 Hyperlipidemia, unspecified: Secondary | ICD-10-CM

## 2013-05-10 DIAGNOSIS — K219 Gastro-esophageal reflux disease without esophagitis: Secondary | ICD-10-CM

## 2013-05-10 DIAGNOSIS — R7989 Other specified abnormal findings of blood chemistry: Secondary | ICD-10-CM | POA: Diagnosis not present

## 2013-05-10 DIAGNOSIS — R1013 Epigastric pain: Secondary | ICD-10-CM

## 2013-05-10 DIAGNOSIS — E669 Obesity, unspecified: Secondary | ICD-10-CM

## 2013-05-10 DIAGNOSIS — J309 Allergic rhinitis, unspecified: Secondary | ICD-10-CM

## 2013-05-10 DIAGNOSIS — R5381 Other malaise: Secondary | ICD-10-CM | POA: Diagnosis not present

## 2013-05-10 LAB — POCT GLYCOSYLATED HEMOGLOBIN (HGB A1C): Hemoglobin A1C: >14

## 2013-05-10 LAB — GLUCOSE, POCT (MANUAL RESULT ENTRY): POC Glucose: 60 mg/dl — AB (ref 70–99)

## 2013-05-10 MED ORDER — INSULIN REGULAR HUMAN 100 UNIT/ML IJ SOLN: 20.0000 [IU] | Freq: Every morning | INTRAMUSCULAR | Status: DC

## 2013-05-10 MED ORDER — NITROGLYCERIN 0.4 MG SL SUBL: 0.4000 mg | SUBLINGUAL_TABLET | SUBLINGUAL | Status: DC | PRN

## 2013-05-10 MED ORDER — CETIRIZINE HCL 10 MG PO TABS: 10.0000 mg | ORAL_TABLET | Freq: Every day | ORAL | Status: DC

## 2013-05-10 MED ORDER — INSULIN DETEMIR 100 UNIT/ML ~~LOC~~ SOLN: SUBCUTANEOUS | Status: DC

## 2013-05-10 MED ORDER — ESOMEPRAZOLE MAGNESIUM 40 MG PO CPDR: 40.0000 mg | DELAYED_RELEASE_CAPSULE | Freq: Every day | ORAL | Status: DC

## 2013-05-10 NOTE — Patient Instructions (Addendum)
Dr Paula Libra Recommendations  For nutrition information, I recommend books:  1).Eat to Live by Dr Excell Seltzer. 2).Prevent and Reverse Heart Disease by Dr Karl Luke. 3) Dr Janene Harvey Book:  Program to Reverse Diabetes  Exercise recommendations are:  If unable to walk, then the patient can exercise in a chair 3 times a day. By flapping arms like a bird gently and raising legs outwards to the front.  If ambulatory, the patient can go for walks for 30 minutes 3 times a week. Then increase the intensity and duration as tolerated.  Goal is to try to attain exercise frequency to 5 times a week.  If applicable: Best to perform resistance exercises (machines or weights) 2 days a week and cardio type exercises 3 days per week.    Gastroesophageal Reflux Disease, Adult Gastroesophageal reflux disease (GERD) happens when acid from your stomach flows up into the esophagus. When acid comes in contact with the esophagus, the acid causes soreness (inflammation) in the esophagus. Over time, GERD may create small holes (ulcers) in the lining of the esophagus. CAUSES   Increased body weight. This puts pressure on the stomach, making acid rise from the stomach into the esophagus.  Smoking. This increases acid production in the stomach.  Drinking alcohol. This causes decreased pressure in the lower esophageal sphincter (valve or ring of muscle between the esophagus and stomach), allowing acid from the stomach into the esophagus.  Late evening meals and a full stomach. This increases pressure and acid production in the stomach.  A malformed lower esophageal sphincter. Sometimes, no cause is found. SYMPTOMS   Burning pain in the lower part of the mid-chest behind the breastbone and in the mid-stomach area. This may occur twice a week or more often.  Trouble swallowing.  Sore throat.  Dry cough.  Asthma-like symptoms including chest tightness, shortness of breath, or  wheezing. DIAGNOSIS  Your caregiver may be able to diagnose GERD based on your symptoms. In some cases, X-rays and other tests may be done to check for complications or to check the condition of your stomach and esophagus. TREATMENT  Your caregiver may recommend over-the-counter or prescription medicines to help decrease acid production. Ask your caregiver before starting or adding any new medicines.  HOME CARE INSTRUCTIONS   Change the factors that you can control. Ask your caregiver for guidance concerning weight loss, quitting smoking, and alcohol consumption.  Avoid foods and drinks that make your symptoms worse, such as:  Caffeine or alcoholic drinks.  Chocolate.  Peppermint or mint flavorings.  Garlic and onions.  Spicy foods.  Citrus fruits, such as oranges, lemons, or limes.  Tomato-based foods such as sauce, chili, salsa, and pizza.  Fried and fatty foods.  Avoid lying down for the 3 hours prior to your bedtime or prior to taking a nap.  Eat small, frequent meals instead of large meals.  Wear loose-fitting clothing. Do not wear anything tight around your waist that causes pressure on your stomach.  Raise the head of your bed 6 to 8 inches with wood blocks to help you sleep. Extra pillows will not help.  Only take over-the-counter or prescription medicines for pain, discomfort, or fever as directed by your caregiver.  Do not take aspirin, ibuprofen, or other nonsteroidal anti-inflammatory drugs (NSAIDs). SEEK IMMEDIATE MEDICAL CARE IF:   You have pain in your arms, neck, jaw, teeth, or back.  Your pain increases or changes in intensity or duration.  You develop  nausea, vomiting, or sweating (diaphoresis).  You develop shortness of breath, or you faint.  Your vomit is green, yellow, black, or looks like coffee grounds or blood.  Your stool is red, bloody, or black. These symptoms could be signs of other problems, such as heart disease, gastric bleeding, or  esophageal bleeding. MAKE SURE YOU:   Understand these instructions.  Will watch your condition.  Will get help right away if you are not doing well or get worse. Document Released: 11/17/2004 Document Revised: 05/02/2011 Document Reviewed: 08/27/2010 [facility] Patient Information 2014 [place], [place].   Return in 1 week on BP medications to check BP.

## 2013-05-10 NOTE — Progress Notes (Signed)
Patient ID: Brad Mendoza, male   DOB: [birth year], 58 y.o.   MRN: [id number] SUBJECTIVE: CC: Chief Complaint  Patient presents with  . Follow-up    3 MONTH FOLLOW UP  WENT TO [facility] ER  ON 3-17.    HPI: Was in the ED has dysuria and it hurts with a stinging and he feels it in his rectum.   Patient is here for follow up of Diabetes Mellitus: Symptoms evaluated: Denies Nocturia ,Denies Urinary Frequency , denies Blurred vision ,deniesDizziness,denies.Dysuria,denies paresthesias, denies extremity pain or ulcers.Marland Kitchendenies chest pain. has had an annual eye exam. do check the feet. Does check CBGs. Average CBG:160 Denies episodes of hypoglycemia. Does have an emergency hypoglycemic plan. admits toCompliance with medications. Denies Problems with medications.  Past Medical History  Diagnosis Date  . CHF (congestive heart failure)   . HTN (hypertension)   . Acid reflux   . Onychomycosis   . Back pain     f4 and f5  . Diverticulosis   . Hyperlipidemia   . Diabetes mellitus 2005   Past Surgical History  Procedure Laterality Date  . Appendectomy     History   Social History  . Marital Status: Married    Spouse Name: N/A    Number of Children: N/A  . Years of Education: N/A   Occupational History  . Not on file.   Social History Main Topics  . Smoking status: Never Smoker   . Smokeless tobacco: Current User    Types: Snuff     Comment: dips snuff about 6 cans/week for 3 years  . Alcohol Use: Yes     Comment: occ/social  . Drug Use: Yes     Comment: admits to some sub abuse- 02-10-12 ov  . Sexual Activity: Not on file   Other Topics Concern  . Not on file   Social History Narrative  . No narrative on file   Family History  Problem Relation Age of Onset  . Hypertension Father   . Diabetes Father   . Cancer Mother   . Alcohol abuse Brother    Current Outpatient Prescriptions on File Prior to Visit  Medication Sig Dispense Refill  . aspirin EC 81 MG tablet Take  81 mg by mouth daily.      [name] azithromycin (ZITHROMAX Z-PAK) 250 MG tablet As directed  6 each  0  . carvedilol (COREG) 12.5 MG tablet Take 1 tablet (12.5 mg total) by mouth 2 (two) times daily.  60 tablet  6  . doxycycline (VIBRAMYCIN) 100 MG capsule Take 1 capsule (100 mg total) by mouth 2 (two) times daily.  28 capsule  0  . fluticasone (FLONASE) 50 MCG/ACT nasal spray Place 2 sprays into the nose daily.  16 g  6  . furosemide (LASIX) 40 MG tablet Take 1 tablet (40 mg total) by mouth daily.  30 tablet  3  . glucose blood (ACCU-CHEK AVIVA PLUS) test strip Use as instructed  100 each  0  . Insulin Syringe-Needle U-100 (B-D INS SYRINGE 0.5CC/30GX1/2") 30G X 1/2" 0.5 ML MISC Use as directed  100 each  0  . isosorbide mononitrate (IMDUR) 30 MG 24 hr tablet Take 1 tablet (30 mg total) by mouth daily.  30 tablet  6  . lisinopril (PRINIVIL,ZESTRIL) 40 MG tablet Take 1 tablet (40 mg total) by mouth daily.  30 tablet  5  . metFORMIN (GLUCOPHAGE) 1000 MG tablet Take 1,000 mg by mouth daily with breakfast.      .  Multiple Vitamins-Minerals (CENTRUM SILVER ADULT 50+ PO) Take 1 tablet by mouth daily.      Renda Rolls AD 4-10-325 MG TABS Take 1 tablet by mouth 3 (three) times daily.  20 tablet  0  . omega-3 fish oil (MAXEPA) 1000 MG CAPS capsule Take 1 capsule by mouth daily.   90 each    . rosuvastatin (CRESTOR) 10 MG tablet Take 1 tablet (10 mg total) by mouth daily.  30 tablet  3   No current facility-administered medications on file prior to visit.   Allergies  Allergen Reactions  . Sulfa Antibiotics Shortness Of Breath  . Penicillins Anxiety    REACTION: rash   Immunization History  Administered Date(s) Administered  . Pneumococcal Polysaccharide-23 11/09/2012  . Tdap 07/26/2012   Prior to Admission medications   Medication Sig Start Date End Date Taking? Authorizing Provider  aspirin EC 81 MG tablet Take 81 mg by mouth daily.    Historical Provider, MD  azithromycin (ZITHROMAX Z-PAK) 250 MG  tablet As directed 04/25/13   Mary-Margaret Hassell Done, FNP  carvedilol (COREG) 12.5 MG tablet Take 1 tablet (12.5 mg total) by mouth 2 (two) times daily. 01/28/13   Herminio Commons, MD  desloratadine (CLARINEX) 5 MG tablet Take 1 tablet (5 mg total) by mouth daily. 01/31/13   Vernie Shanks, MD  doxycycline (VIBRAMYCIN) 100 MG capsule Take 1 capsule (100 mg total) by mouth 2 (two) times daily. 05/07/13   Sharyon Cable, MD  esomeprazole (NEXIUM) 40 MG capsule Take 1 capsule (40 mg total) by mouth daily before breakfast. 12/04/12   Vernie Shanks, MD  fluticasone [facility]) 50 MCG/ACT nasal spray Place 2 sprays into the nose daily. 10/17/12   Erby Pian, FNP  furosemide (LASIX) 40 MG tablet Take 1 tablet (40 mg total) by mouth daily. 01/31/13   Vernie Shanks, MD  glucose blood (ACCU-CHEK AVIVA PLUS) test strip Use as instructed 05/17/12   Gearlean Alf, PA-C  insulin detemir (LEVEMIR) 100 UNIT/ML injection Inject 40 units at bedtime 03/14/13   Tammy Eckard, PHARMD  insulin regular (NOVOLIN R,HUMULIN R) 100 units/mL injection Inject 0.2 mLs (20 Units total) into the skin every morning. 10/12/12   Vernie Shanks, MD  Insulin Syringe-Needle U-100 (B-D INS SYRINGE 0.5CC/30GX1/2") 30G X 1/2" 0.5 ML MISC Use as directed 01/31/13   Vernie Shanks, MD  isosorbide mononitrate (IMDUR) 30 MG 24 hr tablet Take 1 tablet (30 mg total) by mouth daily. 12/06/12   Herminio Commons, MD  lisinopril (PRINIVIL,ZESTRIL) 40 MG tablet Take 1 tablet (40 mg total) by mouth daily. 11/09/12   Vernie Shanks, MD  metFORMIN (GLUCOPHAGE) 1000 MG tablet Take 1,000 mg by mouth daily with breakfast.    Historical Provider, MD  Multiple Vitamins-Minerals (CENTRUM SILVER ADULT 50+ PO) Take 1 tablet by mouth daily.    Historical Provider, MD  nitroGLYCERIN (NITROSTAT) 0.4 MG SL tablet Place 1 tablet (0.4 mg total) under the tongue every 5 (five) minutes as needed. 02/08/13   Vernie Shanks, MD  NOREL AD 4-10-325 MG TABS Take 1  tablet by mouth 3 (three) times daily. 04/25/13   Mary-Margaret Hassell Done, FNP  omega-3 fish oil (MAXEPA) 1000 MG CAPS capsule Take 1 capsule by mouth daily.  07/26/12   Vernie Shanks, MD  rosuvastatin (CRESTOR) 10 MG tablet Take 1 tablet (10 mg total) by mouth daily. 07/24/12   Vernie Shanks, MD     ROS: As above in the  HPI. All other systems are stable or negative.  OBJECTIVE: APPEARANCE:  Patient in no acute distress.The patient appeared well nourished and normally developed. Acyanotic. Waist: VITAL SIGNS:BP 166/75  Pulse 70  Temp(Src) 98.3 F (36.8 C) (Oral)  Ht 5' 9"  (1.753 m)  Wt 217 lb 9.6 oz (98.703 kg)  BMI 32.12 kg/m2  AAM  SKIN: warm and  Dry without overt rashes, tattoos and scars  HEAD and Neck: without JVD, Head and scalp: normal Eyes:No scleral icterus. Fundi normal, eye movements normal. Ears: Auricle normal, canal normal, Tympanic membranes normal, insufflation normal. Nose: normal Throat: normal Neck & thyroid: normal  CHEST & LUNGS: Chest wall: normal Lungs: Clear  CVS: Reveals the PMI to be normally located. Regular rhythm, First and Second Heart sounds are normal,  absence of murmurs, rubs or gallops. Peripheral vasculature: Radial pulses: normal Dorsal pedis pulses: normal Posterior pulses: normal  ABDOMEN:  Appearance: normal Patient c/o pressure in his lower abdomen., no organomegaly, no masses, no Abdominal Aortic enlargement. No Guarding , no rebound. No Bruits. Bowel sounds: normal  RECTAL: N/A GU: N/A  EXTREMETIES: nonedematous.  MUSCULOSKELETAL:  Spine: normal Joints: intact  NEUROLOGIC: oriented to time,place and person; nonfocal. Strength is normal Sensory is normal Reflexes are normal Cranial Nerves are normal.  ASSESSMENT:  Obesity, unspecified  Nonischemic cardiomyopathy - Plan: nitroGLYCERIN (NITROSTAT) 0.4 MG SL tablet  NONDEPENDENT TOBACCO USE DISORDER  HTN (hypertension) - Plan: CMP14+EGFR  HYPERLIPIDEMIA -  Plan: CMP14+EGFR, NMR, lipoprofile  Diabetes mellitus - Plan: POCT glycosylated hemoglobin (Hb A1C), CMP14+EGFR, insulin regular (NOVOLIN R,HUMULIN R) 100 units/mL injection, insulin detemir (LEVEMIR) 100 UNIT/ML injection, POCT glucose (manual entry)  CORONARY ATHEROSCLEROSIS NATIVE CORONARY ARTERY  Acid reflux  Seasonal allergic rhinitis  Rectal pain - Plan: Ambulatory referral to Gastroenterology  Coronary atherosclerosis of native coronary artery - Plan: nitroGLYCERIN (NITROSTAT) 0.4 MG SL tablet  Dysuria - Plan: GC/Chlamydia Probe Amp, HIV antibody  Dyspepsia - Plan: H Pylori, IGM, IGG, IGA AB  GERD (gastroesophageal reflux disease) - Plan: H Pylori, IGM, IGG, IGA AB, esomeprazole (NEXIUM) 40 MG capsule Reviewed his records and work up from the ED in [place].  PLAN:      Dr Paula Libra Recommendations  For nutrition information, I recommend books:  1).Eat to Live by Dr Excell Seltzer. 2).Prevent and Reverse Heart Disease by Dr Karl Luke. 3) Dr Janene Harvey Book:  Program to Reverse Diabetes  Exercise recommendations are:  If unable to walk, then the patient can exercise in a chair 3 times a day. By flapping arms like a bird gently and raising legs outwards to the front.  If ambulatory, the patient can go for walks for 30 minutes 3 times a week. Then increase the intensity and duration as tolerated.  Goal is to try to attain exercise frequency to 5 times a week.  If applicable: Best to perform resistance exercises (machines or weights) 2 days a week and cardio type exercises 3 days per week.   GERD handout in the AVS.  Monitor BP.  Orders Placed This Encounter  Procedures  . GC/Chlamydia Probe Amp  . CMP14+EGFR  . NMR, lipoprofile  . H Pylori, IGM, IGG, IGA AB  . HIV antibody  . Ambulatory referral to Gastroenterology    Referral Priority:  Routine    Referral Type:  Consultation    Referral Reason:  Specialty Services Required    Requested  Specialty:  Gastroenterology    Number of Visits Requested:  1  . POCT glycosylated  hemoglobin (Hb A1C)  . POCT glucose (manual entry)   Meds ordered this encounter  Medications  . nitroGLYCERIN (NITROSTAT) 0.4 MG SL tablet    Sig: Place 1 tablet (0.4 mg total) under the tongue every 5 (five) minutes as needed.    Dispense:  30 tablet    Refill:  1  . esomeprazole (NEXIUM) 40 MG capsule    Sig: Take 1 capsule (40 mg total) by mouth daily before breakfast.    Dispense:  30 capsule    Refill:  4  . insulin regular (NOVOLIN R,HUMULIN R) 100 units/mL injection    Sig: Inject 0.2 mLs (20 Units total) into the skin every morning.    Dispense:  10 mL    Refill:  5  . insulin detemir (LEVEMIR) 100 UNIT/ML injection    Sig: Inject 40 units at bedtime    Dispense:  20 mL    Refill:  2    Replaced NPH insulin  . cetirizine (ZYRTEC) 10 MG tablet    Sig: Take 1 tablet (10 mg total) by mouth daily.    Dispense:  30 tablet    Refill:  5   Medications Discontinued During This Encounter  Medication Reason  . nitroGLYCERIN (NITROSTAT) 0.4 MG SL tablet Reorder  . esomeprazole (NEXIUM) 40 MG capsule Reorder  . insulin regular (NOVOLIN R,HUMULIN R) 100 units/mL injection Reorder  . insulin detemir (LEVEMIR) 100 UNIT/ML injection Reorder  . desloratadine (CLARINEX) 5 MG tablet Formulary change   Return in about 3 months (around 08/10/2013), or BP check in 1 week, for Recheck medical problems.  Alandis Bluemel P. Jacelyn Grip, M.D.

## 2013-05-14 LAB — NMR, LIPOPROFILE
Cholesterol: 226 mg/dL — ABNORMAL HIGH (ref <200)
HDL Cholesterol by NMR: 46 mg/dL (ref >=40)
HDL Particle Number: 30.8 umol/L (ref >=30.5)
LDL Particle Number: 1761 nmol/L — ABNORMAL HIGH (ref <1000)
LDL Size: 20.3 nm — ABNORMAL LOW (ref >20.5)
LDLC SERPL CALC-MCNC: 144 mg/dL — ABNORMAL HIGH (ref <100)
LP-IR Score: 49 — ABNORMAL HIGH (ref <=45)
Small LDL Particle Number: 1094 nmol/L — ABNORMAL HIGH (ref <=527)
Triglycerides by NMR: 180 mg/dL — ABNORMAL HIGH (ref <150)

## 2013-05-14 LAB — H PYLORI, IGM, IGG, IGA AB
H Pylori IgG: <0.9 U/mL (ref 0.0–0.8)
H. pylori, IgA Abs: <9 units (ref 0.0–8.9)
H. pylori, IgM Abs: <9 units (ref 0.0–8.9)

## 2013-05-14 LAB — CMP14+EGFR
ALT: 25 IU/L (ref 0–44)
AST: 23 IU/L (ref 0–40)
Albumin/Globulin Ratio: 1.7 (ref 1.1–2.5)
Albumin: 4 g/dL (ref 3.5–5.5)
Alkaline Phosphatase: 77 IU/L (ref 39–117)
BUN/Creatinine Ratio: 20 (ref 9–20)
BUN: 18 mg/dL (ref 6–24)
CO2: 23 mmol/L (ref 18–29)
Calcium: 9.7 mg/dL (ref 8.7–10.2)
Chloride: 104 mmol/L (ref 97–108)
Creatinine, Ser: 0.88 mg/dL (ref 0.76–1.27)
GFR calc Af Amer: 110 mL/min/{1.73_m2} (ref >59)
GFR calc non Af Amer: 95 mL/min/{1.73_m2} (ref >59)
Globulin, Total: 2.4 g/dL (ref 1.5–4.5)
Glucose: 57 mg/dL — ABNORMAL LOW (ref 65–99)
Potassium: 4.2 mmol/L (ref 3.5–5.2)
Sodium: 143 mmol/L (ref 134–144)
Total Bilirubin: <0.2 mg/dL (ref 0.0–1.2)
Total Protein: 6.4 g/dL (ref 6.0–8.5)

## 2013-05-14 LAB — HIV ANTIBODY (ROUTINE TESTING W REFLEX)
HIV 1/O/2 Abs-Index Value: <1 (ref <1.00)
HIV-1/HIV-2 Ab: NONREACTIVE

## 2013-05-15 LAB — GC/CHLAMYDIA PROBE AMP
Chlamydia trachomatis, NAA: NEGATIVE
Neisseria gonorrhoeae by PCR: NEGATIVE

## 2013-05-16 ENCOUNTER — Ambulatory Visit (INDEPENDENT_AMBULATORY_CARE_PROVIDER_SITE_OTHER): Payer: Medicare Other | Admitting: *Deleted

## 2013-05-16 ENCOUNTER — Other Ambulatory Visit: Payer: Self-pay | Admitting: Family Medicine

## 2013-05-16 VITALS — BP 152/85 | HR 87

## 2013-05-16 DIAGNOSIS — I10 Essential (primary) hypertension: Secondary | ICD-10-CM

## 2013-05-16 MED ORDER — AMLODIPINE BESYLATE 2.5 MG PO TABS: 2.5000 mg | ORAL_TABLET | Freq: Every day | ORAL | Status: DC

## 2013-05-16 NOTE — Progress Notes (Signed)
BP still too high. Will add amlodipine 5 mg daily. Tyse Auriemma P. Jacelyn Grip, M.D.

## 2013-05-16 NOTE — Progress Notes (Signed)
Changed to amlodipine 2.5 mg

## 2013-05-19 NOTE — Progress Notes (Signed)
Quick Note:  Call Patient Labs that are abnormal:  Lipids and the HGBA1C was high.  The rest are at goal  Recommendations: He needs an appointment withTammy to review medications and Adjust if necessary.   ______

## 2013-05-22 ENCOUNTER — Encounter (INDEPENDENT_AMBULATORY_CARE_PROVIDER_SITE_OTHER): Payer: Self-pay | Admitting: *Deleted

## 2013-06-11 ENCOUNTER — Ambulatory Visit (INDEPENDENT_AMBULATORY_CARE_PROVIDER_SITE_OTHER): Payer: Medicare Other | Admitting: Internal Medicine

## 2013-06-11 ENCOUNTER — Encounter (INDEPENDENT_AMBULATORY_CARE_PROVIDER_SITE_OTHER): Payer: Self-pay | Admitting: Internal Medicine

## 2013-06-11 ENCOUNTER — Telehealth (INDEPENDENT_AMBULATORY_CARE_PROVIDER_SITE_OTHER): Payer: Self-pay | Admitting: *Deleted

## 2013-06-11 ENCOUNTER — Other Ambulatory Visit (INDEPENDENT_AMBULATORY_CARE_PROVIDER_SITE_OTHER): Payer: Self-pay | Admitting: *Deleted

## 2013-06-11 VITALS — BP 118/72 | HR 68 | Temp 98.2°F | Ht 69.0 in | Wt 224.6 lb

## 2013-06-11 DIAGNOSIS — R195 Other fecal abnormalities: Secondary | ICD-10-CM

## 2013-06-11 DIAGNOSIS — Z1211 Encounter for screening for malignant neoplasm of colon: Secondary | ICD-10-CM

## 2013-06-11 DIAGNOSIS — I2510 Atherosclerotic heart disease of native coronary artery without angina pectoris: Secondary | ICD-10-CM

## 2013-06-11 DIAGNOSIS — K6289 Other specified diseases of anus and rectum: Secondary | ICD-10-CM

## 2013-06-11 MED ORDER — PEG 3350-KCL-NA BICARB-NACL 420 G PO SOLR
4000.0000 mL | Freq: Once | ORAL | Status: DC
Start: 2013-06-11 — End: 2013-06-26

## 2013-06-11 NOTE — Progress Notes (Signed)
Subjective:     Patient ID: Michae Mendoza, male   DOB: May 22, 1955, 58 y.o.   MRN: [id number]  HPIReferred to our office by Dr. Jacelyn Grip for rectal pain.  He says he has burning in his penis and rectum. Burning sensation x 2 months. He has some pressure in his rectum.  His BMs are normal.He however says sometimes his stools are pencil thin. He usually has a BM daily. He has pressure after he has his BMs.  Pressure will last a few seconds. There has been no melena or bright red rectal bleeding. States he is unable to have an erection. Appetite is good. No weight loss. No abdominal pain,. His last colonoscopy was 2010 Dr. Rowe Pavy and was normal. [facility]). Patient is requesting a colonoscopy ASA 81mg  daily. No other NSAIDs. Blood surgars are usually over 200. CBC    Component Value Date/Time   WBC 6.1 05/07/2013 0810   WBC 7.4 10/11/2012 1321   WBC 5.1 02/20/2012 1044   RBC 5.12 05/07/2013 0810   RBC 5.1 10/11/2012 1321   HGB 14.3 05/07/2013 0810   HGB 15.5 10/11/2012 1321   HCT 42.3 05/07/2013 0810   HCT 46.5 10/11/2012 1321   PLT 216 05/07/2013 0810   MCV 82.6 05/07/2013 0810   MCV 91.0 10/11/2012 1321   MCH 27.9 05/07/2013 0810   MCH 30.3 10/11/2012 1321   MCHC 33.8 05/07/2013 0810   MCHC 33.3 10/11/2012 1321   RDW 12.7 05/07/2013 0810   LYMPHSABS 2.2 05/07/2013 0810   MONOABS 0.5 05/07/2013 0810   EOSABS 0.2 05/07/2013 0810   BASOSABS 0.0 05/07/2013 0810    CMP     Component Value Date/Time   NA 143 05/10/2013 1225   NA 140 05/07/2013 0810   K 4.2 05/10/2013 1225   CL 104 05/10/2013 1225   CO2 23 05/10/2013 1225   GLUCOSE 57* 05/10/2013 1225   GLUCOSE 247* 05/07/2013 0810   BUN 18 05/10/2013 1225   BUN 16 05/07/2013 0810   CREATININE 0.88 05/10/2013 1225   CREATININE 1.04 07/11/2012 1308   CALCIUM 9.7 05/10/2013 1225   PROT 6.4 05/10/2013 1225   PROT 7.1 05/07/2013 0810   ALBUMIN 3.5 05/07/2013 0810   AST 23 05/10/2013 1225   ALT 25 05/10/2013 1225   ALKPHOS 77 05/10/2013 1225   BILITOT <0.2 05/10/2013  1225   GFRNONAA 95 05/10/2013 1225   GFRNONAA 80 07/11/2012 1308   GFRAA 110 05/10/2013 1225   GFRAA >89 07/11/2012 1308  Disabled. Married, 3 children in good health. Past Medical History  Diagnosis Date  . CHF (congestive heart failure)   . HTN (hypertension)   . Acid reflux   . Onychomycosis   . Back pain     f4 and f5  . Diverticulosis   . Hyperlipidemia   . Diabetes mellitus 2005    Past Surgical History  Procedure Laterality Date  . Appendectomy      Allergies  Allergen Reactions  . Sulfa Antibiotics Shortness Of Breath  . Penicillins Anxiety    REACTION: rash    Current Outpatient Prescriptions on File Prior to Visit  Medication Sig Dispense Refill  . amLODipine (NORVASC) 2.5 MG tablet Take 1 tablet (2.5 mg total) by mouth daily.  30 tablet  3  . aspirin EC 81 MG tablet Take 81 mg by mouth daily.      . carvedilol (COREG) 12.5 MG tablet Take 1 tablet (12.5 mg total) by mouth 2 (two) times daily.  60 tablet  6  . cetirizine (ZYRTEC) 10 MG tablet Take 1 tablet (10 mg total) by mouth daily.  30 tablet  5  . doxycycline (VIBRAMYCIN) 100 MG capsule Take 1 capsule (100 mg total) by mouth 2 (two) times daily.  28 capsule  0  . esomeprazole (NEXIUM) 40 MG capsule Take 1 capsule (40 mg total) by mouth daily before breakfast.  30 capsule  4  . fluticasone (FLONASE) 50 MCG/ACT nasal spray Place 2 sprays into the nose daily.  16 g  6  . furosemide (LASIX) 40 MG tablet Take 1 tablet (40 mg total) by mouth daily.  30 tablet  3  . insulin detemir (LEVEMIR) 100 UNIT/ML injection Inject 40 units at bedtime  20 mL  2  . insulin regular (NOVOLIN R,HUMULIN R) 100 units/mL injection Inject 0.2 mLs (20 Units total) into the skin every morning.  10 mL  5  . isosorbide mononitrate (IMDUR) 30 MG 24 hr tablet Take 1 tablet (30 mg total) by mouth daily.  30 tablet  6  . lisinopril (PRINIVIL,ZESTRIL) 40 MG tablet Take 1 tablet (40 mg total) by mouth daily.  30 tablet  5  . metFORMIN (GLUCOPHAGE)  1000 MG tablet Take 1,000 mg by mouth daily with breakfast.      . Multiple Vitamins-Minerals (CENTRUM SILVER ADULT 50+ PO) Take 1 tablet by mouth daily.      . nitroGLYCERIN (NITROSTAT) 0.4 MG SL tablet Place 1 tablet (0.4 mg total) under the tongue every 5 (five) minutes as needed.  30 tablet  1  . NOREL AD 4-10-325 MG TABS Take 1 tablet by mouth 3 (three) times daily.  20 tablet  0  . omega-3 fish oil (MAXEPA) 1000 MG CAPS capsule Take 1 capsule by mouth daily.   90 each    . rosuvastatin (CRESTOR) 10 MG tablet Take 1 tablet (10 mg total) by mouth daily.  30 tablet  3  . glucose blood (ACCU-CHEK AVIVA PLUS) test strip Use as instructed  100 each  0  . Insulin Syringe-Needle U-100 (B-D INS SYRINGE 0.5CC/30GX1/2") 30G X 1/2" 0.5 ML MISC Use as directed  100 each  0   No current facility-administered medications on file prior to visit.       Review of Systems  See hpi    Past Medical History  Diagnosis Date  . CHF (congestive heart failure)   . HTN (hypertension)   . Acid reflux   . Onychomycosis   . Back pain     f4 and f5  . Diverticulosis   . Hyperlipidemia   . Diabetes mellitus 2005    Past Surgical History  Procedure Laterality Date  . Appendectomy      Allergies  Allergen Reactions  . Sulfa Antibiotics Shortness Of Breath  . Penicillins Anxiety    REACTION: rash    Current Outpatient Prescriptions on File Prior to Visit  Medication Sig Dispense Refill  . amLODipine (NORVASC) 2.5 MG tablet Take 1 tablet (2.5 mg total) by mouth daily.  30 tablet  3  . aspirin EC 81 MG tablet Take 81 mg by mouth daily.      . carvedilol (COREG) 12.5 MG tablet Take 1 tablet (12.5 mg total) by mouth 2 (two) times daily.  60 tablet  6  . cetirizine (ZYRTEC) 10 MG tablet Take 1 tablet (10 mg total) by mouth daily.  30 tablet  5  . doxycycline (VIBRAMYCIN) 100 MG capsule Take 1 capsule (100 mg total) by mouth 2 (  two) times daily.  28 capsule  0  . esomeprazole (NEXIUM) 40 MG capsule  Take 1 capsule (40 mg total) by mouth daily before breakfast.  30 capsule  4  . fluticasone (FLONASE) 50 MCG/ACT nasal spray Place 2 sprays into the nose daily.  16 g  6  . furosemide (LASIX) 40 MG tablet Take 1 tablet (40 mg total) by mouth daily.  30 tablet  3  . insulin detemir (LEVEMIR) 100 UNIT/ML injection Inject 40 units at bedtime  20 mL  2  . insulin regular (NOVOLIN R,HUMULIN R) 100 units/mL injection Inject 0.2 mLs (20 Units total) into the skin every morning.  10 mL  5  . isosorbide mononitrate (IMDUR) 30 MG 24 hr tablet Take 1 tablet (30 mg total) by mouth daily.  30 tablet  6  . lisinopril (PRINIVIL,ZESTRIL) 40 MG tablet Take 1 tablet (40 mg total) by mouth daily.  30 tablet  5  . metFORMIN (GLUCOPHAGE) 1000 MG tablet Take 1,000 mg by mouth daily with breakfast.      . Multiple Vitamins-Minerals (CENTRUM SILVER ADULT 50+ PO) Take 1 tablet by mouth daily.      . nitroGLYCERIN (NITROSTAT) 0.4 MG SL tablet Place 1 tablet (0.4 mg total) under the tongue every 5 (five) minutes as needed.  30 tablet  1  . NOREL AD 4-10-325 MG TABS Take 1 tablet by mouth 3 (three) times daily.  20 tablet  0  . omega-3 fish oil (MAXEPA) 1000 MG CAPS capsule Take 1 capsule by mouth daily.   90 each    . rosuvastatin (CRESTOR) 10 MG tablet Take 1 tablet (10 mg total) by mouth daily.  30 tablet  3  . glucose blood (ACCU-CHEK AVIVA PLUS) test strip Use as instructed  100 each  0  . Insulin Syringe-Needle U-100 (B-D INS SYRINGE 0.5CC/30GX1/2") 30G X 1/2" 0.5 ML MISC Use as directed  100 each  0   No current facility-administered medications on file prior to visit.        Objective:   Physical Exam  Filed Vitals:   06/11/13 1058  BP: 118/72  Pulse: 68  Temp: 98.2 F (36.8 C)  Height: 5\' 9"  (1.753 m)  Weight: 224 lb 9.6 oz (101.878 kg)   Alert and oriented. Skin warm and dry. Oral mucosa is moist.   . Sclera anicteric, conjunctivae is pink. Thyroid not enlarged. No cervical lymphadenopathy. Lungs  clear. Heart regular rate and rhythm.  Abdomen is soft. Bowel sounds are positive. No hepatomegaly. No abdominal masses felt. No tenderness.  No edema to lower extremities. Stool brown and guaiac positive.       Assessment:     Change in stool. Rectal pressure. Heme positive stool. Colonic neoplasm needs to be ruled out.     Plan:    Colonoscopy

## 2013-06-11 NOTE — Patient Instructions (Signed)
Colonoscopy with Dr. Rehman. The risks and benefits such as perforation, bleeding, and infection were reviewed with the patient and is agreeable. 

## 2013-06-11 NOTE — Telephone Encounter (Signed)
Patient needs trilyte 

## 2013-06-12 DIAGNOSIS — R195 Other fecal abnormalities: Secondary | ICD-10-CM | POA: Insufficient documentation

## 2013-06-13 ENCOUNTER — Ambulatory Visit: Payer: Medicare Other

## 2013-06-21 ENCOUNTER — Other Ambulatory Visit: Payer: Self-pay | Admitting: Family Medicine

## 2013-06-26 ENCOUNTER — Encounter (HOSPITAL_COMMUNITY): Payer: Self-pay

## 2013-07-03 ENCOUNTER — Other Ambulatory Visit: Payer: Self-pay | Admitting: *Deleted

## 2013-07-03 MED ORDER — LORATADINE 10 MG PO TABS: 10.0000 mg | ORAL_TABLET | Freq: Every day | ORAL | Status: DC

## 2013-08-01 ENCOUNTER — Ambulatory Visit (HOSPITAL_COMMUNITY)
Admission: RE | Admit: 2013-08-01 | Discharge: 2013-08-01 | Disposition: A | Discharge status: Home / Self Care | Payer: Medicare Other | Source: Ambulatory Visit | Attending: Internal Medicine | Admitting: Internal Medicine

## 2013-08-01 ENCOUNTER — Encounter (HOSPITAL_COMMUNITY)
Admission: RE | Disposition: A | Discharge status: Home / Self Care | Payer: Self-pay | Source: Ambulatory Visit | Attending: Internal Medicine

## 2013-08-01 ENCOUNTER — Ambulatory Visit: Payer: Medicare Other | Admitting: Cardiovascular Disease

## 2013-08-01 ENCOUNTER — Encounter (HOSPITAL_COMMUNITY): Payer: Self-pay | Admitting: *Deleted

## 2013-08-01 DIAGNOSIS — K5730 Diverticulosis of large intestine without perforation or abscess without bleeding: Secondary | ICD-10-CM | POA: Insufficient documentation

## 2013-08-01 DIAGNOSIS — I509 Heart failure, unspecified: Secondary | ICD-10-CM | POA: Diagnosis not present

## 2013-08-01 DIAGNOSIS — Z7982 Long term (current) use of aspirin: Secondary | ICD-10-CM | POA: Insufficient documentation

## 2013-08-01 DIAGNOSIS — K921 Melena: Secondary | ICD-10-CM

## 2013-08-01 DIAGNOSIS — K6289 Other specified diseases of anus and rectum: Secondary | ICD-10-CM

## 2013-08-01 DIAGNOSIS — Z79899 Other long term (current) drug therapy: Secondary | ICD-10-CM | POA: Diagnosis not present

## 2013-08-01 DIAGNOSIS — I10 Essential (primary) hypertension: Secondary | ICD-10-CM | POA: Diagnosis not present

## 2013-08-01 DIAGNOSIS — D126 Benign neoplasm of colon, unspecified: Secondary | ICD-10-CM | POA: Insufficient documentation

## 2013-08-01 DIAGNOSIS — E119 Type 2 diabetes mellitus without complications: Secondary | ICD-10-CM | POA: Diagnosis not present

## 2013-08-01 DIAGNOSIS — E785 Hyperlipidemia, unspecified: Secondary | ICD-10-CM | POA: Diagnosis not present

## 2013-08-01 DIAGNOSIS — K219 Gastro-esophageal reflux disease without esophagitis: Secondary | ICD-10-CM | POA: Insufficient documentation

## 2013-08-01 DIAGNOSIS — Z794 Long term (current) use of insulin: Secondary | ICD-10-CM | POA: Insufficient documentation

## 2013-08-01 DIAGNOSIS — K644 Residual hemorrhoidal skin tags: Secondary | ICD-10-CM | POA: Diagnosis not present

## 2013-08-01 HISTORY — PX: COLONOSCOPY: SHX5424

## 2013-08-01 LAB — GLUCOSE, CAPILLARY: Glucose-Capillary: 277 mg/dL — ABNORMAL HIGH (ref 70–99)

## 2013-08-01 SURGERY — COLONOSCOPY
Anesthesia: Moderate Sedation

## 2013-08-01 MED ORDER — MEPERIDINE HCL 50 MG/ML IJ SOLN
INTRAMUSCULAR | Status: DC | PRN
  Administered 2013-08-01 (×2): 25 mg via INTRAVENOUS

## 2013-08-01 MED ORDER — HYOSCYAMINE SULFATE 0.125 MG SL SUBL: 0.1250 mg | SUBLINGUAL_TABLET | Freq: Three times a day (TID) | SUBLINGUAL | Status: DC | PRN

## 2013-08-01 MED ORDER — STERILE WATER FOR IRRIGATION IR SOLN
Status: DC | PRN
  Administered 2013-08-01: 13:00:00

## 2013-08-01 MED ORDER — MIDAZOLAM HCL 5 MG/5ML IJ SOLN
INTRAMUSCULAR | Status: AC
  Filled 2013-08-01: qty 10

## 2013-08-01 MED ORDER — MIDAZOLAM HCL 5 MG/5ML IJ SOLN
INTRAMUSCULAR | Status: DC | PRN
  Administered 2013-08-01 (×2): 2 mg via INTRAVENOUS
  Administered 2013-08-01: 1 mg via INTRAVENOUS

## 2013-08-01 MED ORDER — MEPERIDINE HCL 50 MG/ML IJ SOLN
INTRAMUSCULAR | Status: AC
  Filled 2013-08-01: qty 1

## 2013-08-01 MED ORDER — SODIUM CHLORIDE 0.9 % IV SOLN: INTRAVENOUS | Status: DC

## 2013-08-01 NOTE — H&P (Signed)
Brad Mendoza is an 58 y.o. male.   Chief Complaint: Patient is here for colonoscopy. HPI: Patient is 58 year old African American male who is here for diagnostic colonoscopy. He presents with few months history of intermittent rectal pain. He was seen in the office recently and noted to have heme positive stools. He denies diarrhea constipation or change in stool caliber. He also complains of intermittent fullness in hypogastric area but it does not occur when he is having rectal pain. He has good appetite and denies weight loss. He did not experience rectal pain when he took prep yesterday. Last colonoscopy normal over 5 years ago elsewhere.  Past Medical History  Diagnosis Date  . CHF (congestive heart failure)   . HTN (hypertension)   . Acid reflux   . Onychomycosis   . Back pain     f4 and f5  . Diverticulosis   . Hyperlipidemia   . Diabetes mellitus 2005    Past Surgical History  Procedure Laterality Date  . Appendectomy      Family History  Problem Relation Age of Onset  . Hypertension Father   . Diabetes Father   . Cancer Mother   . Alcohol abuse Brother   . Colon cancer Neg Hx    Social History:  reports that he has never smoked. His smokeless tobacco use includes Snuff. He reports that he drinks about .6 ounces of alcohol per week. He reports that he uses illicit drugs (Marijuana).  Allergies:  Allergies  Allergen Reactions  . Sulfa Antibiotics Shortness Of Breath  . Penicillins Anxiety    REACTION: rash    Medications Prior to Admission  Medication Sig Dispense Refill  . amLODipine (NORVASC) 2.5 MG tablet Take 1 tablet (2.5 mg total) by mouth daily.  30 tablet  3  . aspirin EC 81 MG tablet Take 81 mg by mouth daily.      . carvedilol (COREG) 12.5 MG tablet Take 1 tablet (12.5 mg total) by mouth 2 (two) times daily.  60 tablet  6  . cetirizine (ZYRTEC) 10 MG tablet Take 1 tablet (10 mg total) by mouth daily.  30 tablet  5  . CRESTOR 10 MG tablet TAKE ONE (1)  TABLET EACH DAY  30 tablet  3  . doxycycline (VIBRAMYCIN) 100 MG capsule Take 1 capsule (100 mg total) by mouth 2 (two) times daily.  28 capsule  0  . esomeprazole (NEXIUM) 40 MG capsule Take 1 capsule (40 mg total) by mouth daily before breakfast.  30 capsule  4  . fluticasone (FLONASE) 50 MCG/ACT nasal spray Place 2 sprays into the nose daily.  16 g  6  . furosemide (LASIX) 40 MG tablet Take 1 tablet (40 mg total) by mouth daily.  30 tablet  3  . glucose blood (ACCU-CHEK AVIVA PLUS) test strip Use as instructed  100 each  0  . insulin detemir (LEVEMIR) 100 UNIT/ML injection Inject 40 units at bedtime  20 mL  2  . insulin regular (NOVOLIN R,HUMULIN R) 100 units/mL injection Inject 0.2 mLs (20 Units total) into the skin every morning.  10 mL  5  . Insulin Syringe-Needle U-100 (B-D INS SYRINGE 0.5CC/30GX1/2") 30G X 1/2" 0.5 ML MISC Use as directed  100 each  0  . isosorbide mononitrate (IMDUR) 30 MG 24 hr tablet Take 1 tablet (30 mg total) by mouth daily.  30 tablet  6  . lisinopril (PRINIVIL,ZESTRIL) 40 MG tablet Take 1 tablet (40 mg total) by mouth  daily.  30 tablet  5  . loratadine (CLARITIN) 10 MG tablet Take 1 tablet (10 mg total) by mouth daily.  90 tablet  3  . metFORMIN (GLUCOPHAGE) 1000 MG tablet Take 1,000 mg by mouth daily with breakfast.      . Multiple Vitamins-Minerals (CENTRUM SILVER ADULT 50+ PO) Take 1 tablet by mouth daily.      . nitroGLYCERIN (NITROSTAT) 0.4 MG SL tablet Place 1 tablet (0.4 mg total) under the tongue every 5 (five) minutes as needed.  30 tablet  1  . NOREL AD 4-10-325 MG TABS Take 1 tablet by mouth 3 (three) times daily.  20 tablet  0  . omega-3 fish oil (MAXEPA) 1000 MG CAPS capsule Take 1 capsule by mouth daily.   90 each      Results for orders placed during the hospital encounter of 08/01/13 (from the past 48 hour(s))  GLUCOSE, CAPILLARY     Status: Abnormal   Collection Time    08/01/13 12:25 PM      Result Value Ref Range   Glucose-Capillary 277 (*)  70 - 99 mg/dL   No results found.  ROS  Blood pressure 126/71, pulse 58, temperature 97.9 F (36.6 C), temperature source Oral, resp. rate 22, height 5\' 9"  (1.753 m), weight 222 lb (100.699 kg), SpO2 99.00%. Physical Exam  Constitutional: He appears well-developed and well-nourished.  HENT:  Mouth/Throat: Oropharynx is clear and moist.  Eyes: Conjunctivae are normal. No scleral icterus.  Neck: No thyromegaly present.  Cardiovascular: Normal rate, regular rhythm and normal heart sounds.   No murmur heard. Respiratory: Effort normal and breath sounds normal.  GI: Soft. He exhibits no distension. Tenderness: mild tenderness at LUQ.  Musculoskeletal: He exhibits no edema.  Lymphadenopathy:    He has no cervical adenopathy.  Neurological: He is alert.  Skin: Skin is warm and dry.     Assessment/Plan Heme positive stool. Rectal pain. Diagnostic colonoscopy.  Shawntez Dickison U 08/01/2013, 12:40 PM

## 2013-08-01 NOTE — Discharge Instructions (Signed)
Resume usual medications and high fiber diet. Hyoscyamine or Levsin sublingual one tablet up to 3 times a day as needed for rectal pain. No driving for 24 hours. Physician will call with biopsy results. Keep symptom diary for the next 2 months.  High-Fiber Diet Fiber is found in fruits, vegetables, and grains. A high-fiber diet encourages the addition of more whole grains, legumes, fruits, and vegetables in your diet. The recommended amount of fiber for adult males is 38 g per day. For adult females, it is 25 g per day. Pregnant and lactating women should get 28 g of fiber per day. If you have a digestive or bowel problem, ask your caregiver for advice before adding high-fiber foods to your diet. Eat a variety of high-fiber foods instead of only a select few type of foods.  PURPOSE  To increase stool bulk.  To make bowel movements more regular to prevent constipation.  To lower cholesterol.  To prevent overeating. WHEN IS THIS DIET USED?  It may be used if you have constipation and hemorrhoids.  It may be used if you have uncomplicated diverticulosis (intestine condition) and irritable bowel syndrome.  It may be used if you need help with weight management.  It may be used if you want to add it to your diet as a protective measure against atherosclerosis, diabetes, and cancer. SOURCES OF FIBER  Whole-grain breads and cereals.  Fruits, such as apples, oranges, bananas, berries, prunes, and pears.  Vegetables, such as green peas, carrots, sweet potatoes, beets, broccoli, cabbage, spinach, and artichokes.  Legumes, such split peas, soy, lentils.  Almonds. FIBER CONTENT IN FOODS Starches and Grains / Dietary Fiber (g)  Cheerios, 1 cup / 3 g  Corn Flakes cereal, 1 cup / 0.7 g  Rice crispy treat cereal, 1 cup / 0.3 g  Instant oatmeal (cooked),  cup / 2 g  Frosted wheat cereal, 1 cup / 5.1 g  Brown, long-grain rice (cooked), 1 cup / 3.5 g  White, long-grain rice  (cooked), 1 cup / 0.6 g  Enriched macaroni (cooked), 1 cup / 2.5 g Legumes / Dietary Fiber (g)  Baked beans (canned, plain, or vegetarian),  cup / 5.2 g  Kidney beans (canned),  cup / 6.8 g  Pinto beans (cooked),  cup / 5.5 g Breads and Crackers / Dietary Fiber (g)  Plain or honey graham crackers, 2 squares / 0.7 g  Saltine crackers, 3 squares / 0.3 g  Plain, salted pretzels, 10 pieces / 1.8 g  Whole-wheat bread, 1 slice / 1.9 g  White bread, 1 slice / 0.7 g  Raisin bread, 1 slice / 1.2 g  Plain bagel, 3 oz / 2 g  Flour tortilla, 1 oz / 0.9 g  Corn tortilla, 1 small / 1.5 g  Hamburger or hotdog bun, 1 small / 0.9 g Fruits / Dietary Fiber (g)  Apple with skin, 1 medium / 4.4 g  Sweetened applesauce,  cup / 1.5 g  Banana,  medium / 1.5 g  Grapes, 10 grapes / 0.4 g  Orange, 1 small / 2.3 g  Raisin, 1.5 oz / 1.6 g  Melon, 1 cup / 1.4 g Vegetables / Dietary Fiber (g)  Green beans (canned),  cup / 1.3 g  Carrots (cooked),  cup / 2.3 g  Broccoli (cooked),  cup / 2.8 g  Peas (cooked),  cup / 4.4 g  Mashed potatoes,  cup / 1.6 g  Lettuce, 1 cup / 0.5 g  Corn (  canned),  cup / 1.6 g  Tomato,  cup / 1.1 g Document Released: 02/07/2005 Document Revised: 08/09/2011 Document Reviewed: 05/12/2011 [facility] Patient Information 2014 [place]. Colonoscopy, Care After Refer to this sheet in the next few weeks. These instructions provide you with information on caring for yourself after your procedure. Your health care provider may also give you more specific instructions. Your treatment has been planned according to current medical practices, but problems sometimes occur. Call your health care provider if you have any problems or questions after your procedure. WHAT TO EXPECT AFTER THE PROCEDURE  After your procedure, it is typical to have the following:  A small amount of blood in your stool.  Moderate amounts of gas and mild abdominal cramping  or bloating. HOME CARE INSTRUCTIONS  Do not drive, operate machinery, or sign important documents for 24 hours.  You may shower and resume your regular physical activities, but move at a slower pace for the first 24 hours.  Take frequent rest periods for the first 24 hours.  Walk around or put a warm pack on your abdomen to help reduce abdominal cramping and bloating.  Drink enough fluids to keep your urine clear or pale yellow.  You may resume your normal diet as instructed by your health care provider. Avoid heavy or fried foods that are hard to digest.  Avoid drinking alcohol for 24 hours or as instructed by your health care provider.  Only take over-the-counter or prescription medicines as directed by your health care provider.  If a tissue sample (biopsy) was taken during your procedure:  Do not take aspirin or blood thinners for 7 days, or as instructed by your health care provider.  Do not drink alcohol for 7 days, or as instructed by your health care provider.  Eat soft foods for the first 24 hours. SEEK MEDICAL CARE IF: You have persistent spotting of blood in your stool 2 3 days after the procedure. SEEK IMMEDIATE MEDICAL CARE IF:  You have more than a small spotting of blood in your stool.  You pass large blood clots in your stool.  Your abdomen is swollen (distended).  You have nausea or vomiting.  You have a fever.  You have increasing abdominal pain that is not relieved with medicine. Document Released: 09/22/2003 Document Revised: 11/28/2012 Document Reviewed: 10/15/2012 [facility] Patient Information 2014 [place].

## 2013-08-01 NOTE — Op Note (Signed)
COLONOSCOPY PROCEDURE REPORT  PATIENT:  Brad Mendoza  MR#:  [id number] Birthdate:  Apr 25, 1955, 58 y.o., male Endoscopist:  Dr. Rogene Houston, MD Referred By:  Dr. Anthoney Harada, MD  Procedure Date: 08/01/2013  Procedure:   Colonoscopy  Indications: Patient is 58 year old African male who was recently seen in office for intermittent rectal pain and noted to have heme positive stool. He is therefore undergoing diagnostic colonoscopy. His last colonoscopy was over 5 years ago and was reportedly normal.  Informed Consent:  The procedure and risks were reviewed with the patient and informed consent was obtained.  Medications:  Demerol 50 mg IV Versed 5 mg IV  Description of procedure:  After a digital rectal exam was performed, that colonoscope was advanced from the anus through the rectum and colon to the area of the cecum, ileocecal valve and appendiceal orifice. The cecum was deeply intubated. These structures were well-seen and photographed for the record. From the level of the cecum and ileocecal valve, the scope was slowly and cautiously withdrawn. The mucosal surfaces were carefully surveyed utilizing scope tip to flexion to facilitate fold flattening as needed. The scope was pulled down into the rectum where a thorough exam including retroflexion was performed.  Findings:   Prep satisfactory. Four small polyps were removed and submitted together;   - Polyp from hepatic flexure and proximal transverse colon were cold snared;    -Another polyp from  hepatic flexure and sigmoid colon removed with cold biopsy forceps. Scattered diverticula right-sided and sigmoid diverticula. Normal rectal mucosa. Small hemorrhoids below the dentate line.   Therapeutic/Diagnostic Maneuvers Performed:  See above  Complications:  None  Cecal Withdrawal Time:  19 minutes  Impression:  Examination performed to cecum. Scattered right-sided and sigmoid colon diverticula. Four small polyps  removed as above and submitted together; two at hepatic flexure; one at proximal transverse colon and the fourth one at sigmoid colon. Small external hemorrhoids. No evidence of proctitis.  Recommendations:  Standard instructions given. Hyoscyamine sublingual 1 tablet 3 times a day when necessary I will contact patient with biopsy results and further recommendations.  REHMAN,NAJEEB U  08/01/2013 1:24 PM  CC: Dr. Anthoney Harada, MD & Dr. Rayne Du ref. provider found

## 2013-08-02 ENCOUNTER — Encounter (HOSPITAL_COMMUNITY): Payer: Self-pay | Admitting: Internal Medicine

## 2013-08-08 ENCOUNTER — Encounter (INDEPENDENT_AMBULATORY_CARE_PROVIDER_SITE_OTHER): Payer: Self-pay | Admitting: *Deleted

## 2013-08-19 ENCOUNTER — Ambulatory Visit: Payer: Medicare Other | Admitting: Cardiovascular Disease

## 2013-08-22 ENCOUNTER — Ambulatory Visit: Payer: Medicare Other | Admitting: Cardiovascular Disease

## 2013-09-05 ENCOUNTER — Encounter: Payer: Self-pay | Admitting: Family Medicine

## 2013-09-05 ENCOUNTER — Ambulatory Visit (INDEPENDENT_AMBULATORY_CARE_PROVIDER_SITE_OTHER): Payer: Medicare Other | Admitting: Family Medicine

## 2013-09-05 VITALS — BP 153/85 | HR 83 | Temp 99.2°F | Ht 69.0 in | Wt 211.0 lb

## 2013-09-05 DIAGNOSIS — I10 Essential (primary) hypertension: Secondary | ICD-10-CM | POA: Diagnosis not present

## 2013-09-05 DIAGNOSIS — E119 Type 2 diabetes mellitus without complications: Secondary | ICD-10-CM | POA: Diagnosis not present

## 2013-09-05 DIAGNOSIS — I2510 Atherosclerotic heart disease of native coronary artery without angina pectoris: Secondary | ICD-10-CM

## 2013-09-05 LAB — GLUCOSE, POCT (MANUAL RESULT ENTRY): POC Glucose: 410 mg/dl — AB (ref 70–99)

## 2013-09-05 LAB — POCT GLYCOSYLATED HEMOGLOBIN (HGB A1C): Hemoglobin A1C: >14

## 2013-09-05 NOTE — Addendum Note (Signed)
Addended by: Earlene Plater on: 09/05/2013 09:20 AM   Modules accepted: Orders

## 2013-09-05 NOTE — Progress Notes (Signed)
   Subjective:    Patient ID: Brad Mendoza, male    DOB: 04/29/1955, 58 y.o.   MRN: [id number]  HPI  Four month F/U.  Feet burning (not new) No chest pain    Review of Systems  Constitutional: Negative.   Eyes: Negative.   Respiratory: Negative.   Cardiovascular: Negative.   Gastrointestinal: Negative.   Endocrine: Negative.   Genitourinary: Negative.   Musculoskeletal: Negative.   Skin: Negative.   Neurological: Negative.   Psychiatric/Behavioral: Negative.        Objective:   Physical Exam  Vitals reviewed. Constitutional: He appears well-developed and well-nourished.  HENT:  Head: Normocephalic.  Eyes: Pupils are equal, round, and reactive to light.  Neck: Normal range of motion.  Cardiovascular: Normal rate, regular rhythm and normal heart sounds.   Musculoskeletal: Normal range of motion.  Neurological: He is alert.           Assessment & Plan:  1. Essential hypertension BP okay; will continue same meds  2. DM Sugars have been high; admits to poor diet; check A1C and BMET

## 2013-09-05 NOTE — Patient Instructions (Signed)
Diabetes and Exercise Exercising regularly is important. It is not just about losing weight. It has many health benefits, such as:  Improving your overall fitness, flexibility, and endurance.  Increasing your bone density.  Helping with weight control.  Decreasing your body fat.  Increasing your muscle strength.  Reducing stress and tension.  Improving your overall health. People with diabetes who exercise gain additional benefits because exercise:  Reduces appetite.  Improves the body's use of blood sugar (glucose).  Helps lower or control blood glucose.  Decreases blood pressure.  Helps control blood lipids (such as cholesterol and triglycerides).  Improves the body's use of the hormone insulin by:  Increasing the body's insulin sensitivity.  Reducing the body's insulin needs.  Decreases the risk for heart disease because exercising:  Lowers cholesterol and triglycerides levels.  Increases the levels of good cholesterol (such as high-density lipoproteins [HDL]) in the body.  Lowers blood glucose levels. YOUR ACTIVITY PLAN  Choose an activity that you enjoy and set realistic goals. Your health care provider or diabetes educator can help you make an activity plan that works for you. You can break activities into 2 or 3 sessions throughout the day. Doing so is as good as one long session. Exercise ideas include:  Taking the dog for a walk.  Taking the stairs instead of the elevator.  Dancing to your favorite song.  Doing your favorite exercise with a friend. RECOMMENDATIONS FOR EXERCISING WITH TYPE 1 OR TYPE 2 DIABETES   Check your blood glucose before exercising. If blood glucose levels are greater than 240 mg/dL, check for urine ketones. Do not exercise if ketones are present.  Avoid injecting insulin into areas of the body that are going to be exercised. For example, avoid injecting insulin into:  The arms when playing tennis.  The legs when  jogging.  Keep a record of:  Food intake before and after you exercise.  Expected peak times of insulin action.  Blood glucose levels before and after you exercise.  The type and amount of exercise you have done.  Review your records with your health care provider. Your health care provider will help you to develop guidelines for adjusting food intake and insulin amounts before and after exercising.  If you take insulin or oral hypoglycemic agents, watch for signs and symptoms of hypoglycemia. They include:  Dizziness.  Shaking.  Sweating.  Chills.  Confusion.  Drink plenty of water while you exercise to prevent dehydration or heat stroke. Body water is lost during exercise and must be replaced.  Talk to your health care provider before starting an exercise program to make sure it is safe for you. Remember, almost any type of activity is better than none. Document Released: 04/30/2003 Document Revised: 10/10/2012 Document Reviewed: 07/17/2012 [employer] Patient Information 2015 [employer]. This information is not intended to replace advice given to you by your health care provider. Make sure you discuss any questions you have with your health care provider.  

## 2013-09-06 ENCOUNTER — Telehealth: Payer: Self-pay | Admitting: Family Medicine

## 2013-09-06 LAB — BMP8+EGFR
BUN / CREAT RATIO: 23 — AB (ref 9–20)
BUN: 19 mg/dL (ref 6–24)
CO2: 20 mmol/L (ref 18–29)
CREATININE: 0.83 mg/dL (ref 0.76–1.27)
Calcium: 9.2 mg/dL (ref 8.7–10.2)
Chloride: 96 mmol/L — ABNORMAL LOW (ref 97–108)
GFR calc non Af Amer: 98 mL/min/{1.73_m2} (ref >59)
GFR, EST AFRICAN AMERICAN: 113 mL/min/{1.73_m2} (ref >59)
Glucose: 426 mg/dL (ref 65–99)
Potassium: 4.4 mmol/L (ref 3.5–5.2)
SODIUM: 134 mmol/L (ref 134–144)

## 2013-09-06 NOTE — Telephone Encounter (Signed)
Message copied by Waverly Ferrari on Fri Sep 06, 2013 10:11 AM ------      Message from: [name]      Created: Fri Sep 06, 2013  8:02 AM       Labs are reviewed blood glucose varies from low to high over the past year ranging from 426-57. His renal function is normal. As expected hemoglobin A1c is markedly elevated at greater than 14. I would like for him to get an appointment Tammy to see if we can improve on his diabetes. ------

## 2013-09-16 ENCOUNTER — Ambulatory Visit (INDEPENDENT_AMBULATORY_CARE_PROVIDER_SITE_OTHER): Payer: Medicare Other | Admitting: Pharmacist

## 2013-09-16 ENCOUNTER — Encounter: Payer: Self-pay | Admitting: Pharmacist

## 2013-09-16 VITALS — BP 140/78 | HR 80 | Ht 69.0 in | Wt 208.0 lb

## 2013-09-16 DIAGNOSIS — I2510 Atherosclerotic heart disease of native coronary artery without angina pectoris: Secondary | ICD-10-CM

## 2013-09-16 DIAGNOSIS — E118 Type 2 diabetes mellitus with unspecified complications: Secondary | ICD-10-CM

## 2013-09-16 DIAGNOSIS — I10 Essential (primary) hypertension: Secondary | ICD-10-CM

## 2013-09-16 MED ORDER — "PEN NEEDLES 3/16"" 31G X 5 MM MISC": Status: DC

## 2013-09-16 MED ORDER — INSULIN DETEMIR 100 UNIT/ML FLEXPEN: PEN_INJECTOR | SUBCUTANEOUS | Status: DC

## 2013-09-16 MED ORDER — INSULIN ASPART 100 UNIT/ML FLEXPEN: PEN_INJECTOR | SUBCUTANEOUS | Status: DC

## 2013-09-16 NOTE — Patient Instructions (Signed)
Increase Levemir to 50 units at bedtime - also changed to pens    Regular / Novolog Insulin dose - sliding scale. Check Blood glucose prior to each meal and given insulin according to below  Less than 80 - no insulin  80 to 120 - 8 units  121- 200 - 9 units  201 - 250 - 10 units  250 - 300 - 11 units  301 - 350 - 12 units  351 or above - 14 units

## 2013-09-16 NOTE — Progress Notes (Signed)
Diabetes Visit  Chief Complaint:   Chief Complaint  Patient presents with  . Diabetes     Filed Vitals:   09/16/13 1501  BP: 140/78  Pulse: 80   Filed Weights   09/16/13 1501  Weight: 208 lb (94.348 kg)   Body mass index is 30.7 kg/(m^2).  HPI: patient with uncontrolled DM diagnosed around 2007.   Current Diabetes Medications:  Metfromin 1000mg  BID, NPH insulin 40 units daily and Regular insulin per sliding scale Also did not bring in glucometer but reports HBG ranging from 48 (occurred when he skipped a meal and was working outside) to 500's  Low fat/carbohydrate diet?  No - patient admits to eating lots of bread Nicotine Abuse?  No Medication Compliance?  No - admits to forgetting insulin 3-4 times weekly Exercise?  No Alcohol use?  Yes - 1 to 4 beer per day  Exam Edema:  negative  Polyuria:  positive  Polydipsia:  negative Polyphagia:  negataive  BMI:  Body mass index is 30.7 kg/(m^2).   Weight changes:  Decreased 3# over last 2 weeks General Appearance:  alert, oriented, no acute distress and obese Mood/Affect:  normal    Lab Results  Component Value Date   HGBA1C >14.0 09/05/2013     Assessment: Diabetes.  Uncontrolled - not following CHO counting diet and forgetting insulin about 3 -4 times per week  Recommendations: 1.  Medication recommendations at this time are as follows:    Increase Levemir to 50 units at bedtime - also changed to pens  Regular / Novolog Insulin dose - sliding scale. Check Blood glucose prior to each meal and given insulin according to below  Less than 80 - no insulin  80 to 120 - 8 units  121- 200 - 9 units  201 - 250 - 10 units  250 - 300 - 11 units  301 - 350 - 12 units  351 or above - 14 units   2.  Reviewed HBG goals:  Fasting 80-130 and 1-2 hour post prandial <180.  Patient is instructed to check BG 4 times per day.    3.  Dietary recommendations:  Discussed CHO counting  - gave picture depiction of serving sizes and high CHO  foods  Discussed alcohol's effect on BG - advised patient to eat a small snack (about 10 to 15 grams of carbohydrates with any alcoholic drinks to prevent hypoglycemia)  Discussed alternatives to bread 4.  Physical Activity recommendations:  Continue gardening and be active 5.  Return to clinic in 2-3 weeks   Time spent counseling patient:  40 minutes   Cherre Robins, PharmD, CPP

## 2013-09-18 ENCOUNTER — Ambulatory Visit (INDEPENDENT_AMBULATORY_CARE_PROVIDER_SITE_OTHER): Payer: PRIVATE HEALTH INSURANCE | Admitting: Cardiovascular Disease

## 2013-09-18 ENCOUNTER — Encounter: Payer: Self-pay | Admitting: Cardiovascular Disease

## 2013-09-18 VITALS — BP 129/85 | HR 84 | Ht 69.0 in | Wt 209.0 lb

## 2013-09-18 DIAGNOSIS — I209 Angina pectoris, unspecified: Secondary | ICD-10-CM

## 2013-09-18 DIAGNOSIS — I429 Cardiomyopathy, unspecified: Secondary | ICD-10-CM

## 2013-09-18 DIAGNOSIS — I25119 Atherosclerotic heart disease of native coronary artery with unspecified angina pectoris: Secondary | ICD-10-CM | POA: Insufficient documentation

## 2013-09-18 DIAGNOSIS — I2510 Atherosclerotic heart disease of native coronary artery without angina pectoris: Secondary | ICD-10-CM | POA: Insufficient documentation

## 2013-09-18 DIAGNOSIS — I428 Other cardiomyopathies: Secondary | ICD-10-CM

## 2013-09-18 DIAGNOSIS — I10 Essential (primary) hypertension: Secondary | ICD-10-CM

## 2013-09-18 DIAGNOSIS — R1310 Dysphagia, unspecified: Secondary | ICD-10-CM

## 2013-09-18 DIAGNOSIS — E785 Hyperlipidemia, unspecified: Secondary | ICD-10-CM

## 2013-09-18 DIAGNOSIS — K219 Gastro-esophageal reflux disease without esophagitis: Secondary | ICD-10-CM

## 2013-09-18 MED ORDER — ISOSORBIDE MONONITRATE ER 60 MG PO TB24: 60.0000 mg | ORAL_TABLET | Freq: Every day | ORAL | Status: DC

## 2013-09-18 NOTE — Patient Instructions (Signed)
   Increase Imdur to 60mg  daily - may take 2 tabs of your 30mg  daily till finish current supply - new sent to pharm. Continue all other medications.   Referral to GI - Dr. Laural Golden Your physician wants you to follow up in:  4 months.  You will receive a reminder letter in the mail one-two months in advance.  If you don't receive a letter, please call our office to schedule the follow up appointment

## 2013-09-18 NOTE — Progress Notes (Signed)
Patient ID: Brad Mendoza, male   DOB: November 10, 1955, 58 y.o.   MRN: [id number]      SUBJECTIVE: The patient is here to followup for chronic systolic heart failure, coronary artery disease, hypertension and hyperlipidemia. He has a history of a nonischemic cardiomyopathy. Echocardiogram in October 2014 demonstrated mildly reduced left ventricular systolic function, EF [id number], with grade 1 diastolic dysfunction, moderate left atrial and mild right atrial enlargement. Nuclear stress testing in October 2014 demonstrated anterior wall and anteroseptal infarcts with no suggestion of ischemia. He continues to struggle with heartburn after eating with a burning sensation in the center of his chest and on the right side of his chest. He does not believe the Nexium is working anymore. He occasionally has left-sided chest twinges which last seconds and exacerbated with deep inspiration. He sometimes has the sensation of food getting stuck in the middle of his chest after swallowing. He denies orthopnea, exertional dyspnea and paroxysmal nocturnal dyspnea. He also has neuropathic pain in the bottom of his feet. He seldom has exertional chest discomfort. He also occasionally has left ear fullness without ringing. Has a history of seasonal allergies. 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  ROS: as per subjective, otherwise negative.  Allergies  Allergen Reactions  . Sulfa Antibiotics Shortness Of Breath  . Penicillins Anxiety    REACTION: rash    Current Outpatient Prescriptions  Medication Sig Dispense Refill  . amLODipine (NORVASC) 2.5 MG tablet Take 1 tablet (2.5 mg total) by mouth daily.  30 tablet  3  . aspirin EC 81 MG tablet Take 81 mg by mouth daily.      . carvedilol (COREG) 12.5 MG tablet Take 1 tablet (12.5 mg total) by mouth 2 (two) times daily.  60 tablet  6  . cetirizine (ZYRTEC) 10 MG tablet Take 1 tablet (10 mg total) by mouth daily.  30 tablet  5  . CRESTOR 10 MG tablet TAKE ONE (1) TABLET EACH  DAY  30 tablet  3  . esomeprazole (NEXIUM) 40 MG capsule Take 1 capsule (40 mg total) by mouth daily before breakfast.  30 capsule  4  . fluticasone (FLONASE) 50 MCG/ACT nasal spray Place 2 sprays into the nose daily.  16 g  6  . furosemide (LASIX) 40 MG tablet Take 1 tablet (40 mg total) by mouth daily.  30 tablet  3  . glucose blood (ACCU-CHEK AVIVA PLUS) test strip Use as instructed  100 each  0  . insulin aspart (NOVOLOG FLEXPEN) 100 UNIT/ML FlexPen Inject up to 15 units prior to each meal and as needed for elevated blood glucose  15 mL  2  . Insulin Detemir (LEVEMIR FLEXTOUCH) 100 UNIT/ML Pen Inject 50 units each night at bedtime  15 mL  2  . Insulin Pen Needle (PEN NEEDLES 3/16") 31G X 5 MM MISC Use with insulin pens qid. Dx:  250.03  100 each  2  . Insulin Syringe-Needle U-100 (B-D INS SYRINGE 0.5CC/30GX1/2") 30G X 1/2" 0.5 ML MISC Use as directed  100 each  0  . isosorbide mononitrate (IMDUR) 30 MG 24 hr tablet Take 1 tablet (30 mg total) by mouth daily.  30 tablet  6  . lisinopril (PRINIVIL,ZESTRIL) 40 MG tablet Take 1 tablet (40 mg total) by mouth daily.  30 tablet  5  . loratadine (CLARITIN) 10 MG tablet Take 1 tablet (10 mg total) by mouth daily.  90 tablet  3  . metFORMIN (GLUCOPHAGE) 1000 MG tablet Take  1,000 mg by mouth 2 (two) times daily with a meal.       . Multiple Vitamins-Minerals (CENTRUM SILVER ADULT 50+ PO) Take 1 tablet by mouth daily.      . nitroGLYCERIN (NITROSTAT) 0.4 MG SL tablet Place 1 tablet (0.4 mg total) under the tongue every 5 (five) minutes as needed.  30 tablet  1  . omega-3 fish oil (MAXEPA) 1000 MG CAPS capsule Take 1 capsule by mouth daily.   90 each     No current facility-administered medications for this visit.    Past Medical History  Diagnosis Date  . CHF (congestive heart failure)   . HTN (hypertension)   . Acid reflux   . Onychomycosis   . Back pain     f4 and f5  . Diverticulosis   . Hyperlipidemia   . Diabetes mellitus 2005    Past  Surgical History  Procedure Laterality Date  . Appendectomy    . Colonoscopy N/A 08/01/2013    Procedure: COLONOSCOPY;  Surgeon: Rogene Houston, MD;  Location: [facility];  Service: Endoscopy;  Laterality: N/A;  rescheduled to [place] notified pt    History   Social History  . Marital Status: Married    Spouse Name: N/A    Number of Children: N/A  . Years of Education: N/A   Occupational History  . Not on file.   Social History Main Topics  . Smoking status: Never Smoker   . Smokeless tobacco: Current User    Types: Snuff     Comment: dips snuff about 6 cans/week for 3 years  . Alcohol Use: 0.6 oz/week    1 Cans of beer per week  . Drug Use: Yes    Special: Marijuana     Comment: last used 07/31/13 per pt-61/11/15  . Sexual Activity: Not on file   Other Topics Concern  . Not on file   Social History Narrative  . No narrative on file     Filed Vitals:   09/18/13 0909  BP: 129/85  Pulse: 84  Height: 5\' 9"  (1.753 m)  Weight: 209 lb (94.802 kg)    PHYSICAL EXAM General: NAD Neck: No JVD, no thyromegaly. Lungs: Clear to auscultation bilaterally with normal respiratory effort. CV: Nondisplaced PMI.  Regular rate and rhythm, normal S1/S2, no S3/S4, no murmur. No pretibial or periankle edema.  No carotid bruit.  Normal pedal pulses.  Abdomen: Soft, nontender, no hepatosplenomegaly, no distention.  Neurologic: Alert and oriented x 3.  Psych: Normal affect. Extremities: No clubbing or cyanosis.   ECG: reviewed and available in electronic records.      ASSESSMENT AND PLAN: 1. Chest pain in the setting of known CAD (50% prior LAD lesion): Previous stress test showed only anterior and anteroseptal wall infarct without ischemia. He continues to experience chest pains on occasion, but only lasting seconds, which may be musculoskeletal or pleuritic in etiology. The "burning sensation" after eating certainly favors GERD symptoms. I will increase Imdur to 60 mg daily  and have him follow up with Dr. Laural Golden, as it appears he needs an EGD given concomitant dysphagia for solids as well. Continue amlodipine, ASA, and Coreg at present doses. His  2. HTN: Controlled on present therapy.  3. Hyperlipidemia: Will obtain lipid results from PCP. Continue Crestor 10 mg daily.   4. Chronic systolic heart failure: Well compensated on current medical therapy. EF improved to 40-45% in 11/2012. Continue Lasix 40 mg daily along with Coreg and lisinopril.  5. GERD/dysphagia: Will have him follow up with Dr. Laural Golden, as it appears he needs an EGD given continued acid reflux symptoms with concomitant dysphagia for solids as well.   Dispo: f/u 4 months.  Kate Sable, M.D., F.A.C.C.

## 2013-09-25 ENCOUNTER — Encounter (INDEPENDENT_AMBULATORY_CARE_PROVIDER_SITE_OTHER): Payer: Self-pay | Admitting: *Deleted

## 2013-10-03 ENCOUNTER — Ambulatory Visit (INDEPENDENT_AMBULATORY_CARE_PROVIDER_SITE_OTHER): Payer: PRIVATE HEALTH INSURANCE | Admitting: Internal Medicine

## 2013-10-17 ENCOUNTER — Ambulatory Visit: Payer: Self-pay

## 2013-10-18 ENCOUNTER — Telehealth: Payer: Self-pay | Admitting: Pharmacist

## 2013-10-18 NOTE — Telephone Encounter (Signed)
Tried to call patient to reschedule missed appt to follow up diabetes.  No answer but left message to call office to reschedule.

## 2013-11-27 ENCOUNTER — Encounter (INDEPENDENT_AMBULATORY_CARE_PROVIDER_SITE_OTHER): Payer: Self-pay | Admitting: *Deleted

## 2013-12-12 ENCOUNTER — Ambulatory Visit: Payer: Medicare Other | Admitting: Family Medicine

## 2013-12-16 ENCOUNTER — Encounter: Payer: Self-pay | Admitting: Nurse Practitioner

## 2013-12-16 ENCOUNTER — Ambulatory Visit (INDEPENDENT_AMBULATORY_CARE_PROVIDER_SITE_OTHER): Payer: Medicare Other | Admitting: Nurse Practitioner

## 2013-12-16 VITALS — BP 143/91 | HR 96 | Temp 97.0°F | Ht 69.0 in | Wt 206.6 lb

## 2013-12-16 DIAGNOSIS — E669 Obesity, unspecified: Secondary | ICD-10-CM

## 2013-12-16 DIAGNOSIS — R635 Abnormal weight gain: Secondary | ICD-10-CM | POA: Diagnosis not present

## 2013-12-16 DIAGNOSIS — E119 Type 2 diabetes mellitus without complications: Secondary | ICD-10-CM

## 2013-12-16 DIAGNOSIS — E785 Hyperlipidemia, unspecified: Secondary | ICD-10-CM | POA: Diagnosis not present

## 2013-12-16 DIAGNOSIS — E1142 Type 2 diabetes mellitus with diabetic polyneuropathy: Secondary | ICD-10-CM | POA: Insufficient documentation

## 2013-12-16 DIAGNOSIS — I10 Essential (primary) hypertension: Secondary | ICD-10-CM

## 2013-12-16 LAB — GLUCOSE, POCT (MANUAL RESULT ENTRY): POC GLUCOSE: 83 mg/dL (ref 70–99)

## 2013-12-16 LAB — POCT GLYCOSYLATED HEMOGLOBIN (HGB A1C)

## 2013-12-16 MED ORDER — INSULIN DETEMIR 100 UNIT/ML FLEXPEN: PEN_INJECTOR | SUBCUTANEOUS | Status: DC

## 2013-12-16 NOTE — Progress Notes (Signed)
   Subjective:    Patient ID: Brad Mendoza, male    DOB: 04/13/1955, 58 y.o.   MRN: [id number] Patient is here following up on chronic disease. No acute complaint  Diabetes He presents for his follow-up diabetic visit. He has type 2 diabetes mellitus. There are no hypoglycemic associated symptoms. Associated symptoms include fatigue, foot paresthesias, polydipsia, polyuria and weight loss. Pertinent negatives for diabetes include no blurred vision. Diabetic complications include heart disease. Risk factors for coronary artery disease include male sex, obesity, hypertension and diabetes mellitus. Current diabetic treatment includes insulin injections and oral agent (monotherapy). He is following a diabetic diet. An ACE inhibitor/angiotensin II receptor blocker is being taken. Eye exam is not current.  Hypertension This is a chronic problem. The current episode started more than 1 year ago. The problem has been gradually improving since onset. The problem is controlled. Pertinent negatives include no blurred vision. Risk factors for coronary artery disease include diabetes mellitus, male gender and obesity. Past treatments include calcium channel blockers. The current treatment provides moderate improvement.  CAD: he sees a cardiologist regularly.  Gerd: Nexium doing well.     Review of Systems  Constitutional: Positive for weight loss and fatigue.  Eyes: Negative for blurred vision.  Endocrine: Positive for polydipsia and polyuria.  All other systems reviewed and are negative.      Objective:   Physical Exam  Constitutional: He is oriented to person, place, and time. He appears well-developed and well-nourished.  HENT:  Head: Normocephalic.  Eyes: Pupils are equal, round, and reactive to light.  Neck: Normal range of motion.  Cardiovascular: Normal rate and regular rhythm.   Pulmonary/Chest: Effort normal.  Abdominal: Soft.  Musculoskeletal: Normal range of motion.  Neurological: He  is alert and oriented to person, place, and time.  Skin: Skin is warm.  Psychiatric: He has a normal mood and affect. His behavior is normal.   BP 143/91  Pulse 96  Temp(Src) 97 F (36.1 C) (Oral)  Ht $R'5\' 9"'xE$  (1.753 m)  Wt 206 lb 9.6 oz (93.713 kg)  BMI 30.50 kg/m2  Results for orders placed in visit on 12/16/13  POCT GLYCOSYLATED HEMOGLOBIN (HGB A1C)      Result Value Ref Range   Hemoglobin A1C >14.0    GLUCOSE, POCT (MANUAL RESULT ENTRY)      Result Value Ref Range   POC Glucose 83  70 - 99 mg/dl          Assessment & Plan:  1. 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 Essential hypertension Low NA diet - CMP14+EGFR  3. Hyperlipemia Low fat diet - NMR, lipoprofile  4. Obesity Discussed diet and exercise for person with BMI >25 Will recheck weight in 3-6 months     Labs pending Health maintenance reviewed Diet and exercise encouraged Continue all meds Follow up  In 1 month   [place], FNP

## 2013-12-16 NOTE — Patient Instructions (Addendum)
Diabetes and Exercise Exercising regularly is important. It is not just about losing weight. It has many health benefits, such as:  Improving your overall fitness, flexibility, and endurance.  Increasing your bone density.  Helping with weight control.  Decreasing your body fat.  Increasing your muscle strength.  Reducing stress and tension.  Improving your overall health. People with diabetes who exercise gain additional benefits because exercise:  Reduces appetite.  Improves the body's use of blood sugar (glucose).  Helps lower or control blood glucose.  Decreases blood pressure.  Helps control blood lipids (such as cholesterol and triglycerides).  Improves the body's use of the hormone insulin by:  Increasing the body's insulin sensitivity.  Reducing the body's insulin needs.  Decreases the risk for heart disease because exercising:  Lowers cholesterol and triglycerides levels.  Increases the levels of good cholesterol (such as high-density lipoproteins [HDL]) in the body.  Lowers blood glucose levels. YOUR ACTIVITY PLAN  Choose an activity that you enjoy and set realistic goals. Your health care provider or diabetes educator can help you make an activity plan that works for you. Exercise regularly as directed by your health care provider. This includes:  Performing resistance training twice a week such as push-ups, sit-ups, lifting weights, or using resistance bands.  Performing 150 minutes of cardio exercises each week such as walking, running, or playing sports.  Staying active and spending no more than 90 minutes at one time being inactive. Even short bursts of exercise are good for you. Three 10-minute sessions spread throughout the day are just as beneficial as a single 30-minute session. Some exercise ideas include:  Taking the dog for a walk.  Taking the stairs instead of the elevator.  Dancing to your favorite song.  Doing an exercise  video.  Doing your favorite exercise with a friend. RECOMMENDATIONS FOR EXERCISING WITH TYPE 1 OR TYPE 2 DIABETES   Check your blood glucose before exercising. If blood glucose levels are greater than 240 mg/dL, check for urine ketones. Do not exercise if ketones are present.  Avoid injecting insulin into areas of the body that are going to be exercised. For example, avoid injecting insulin into:  The arms when playing tennis.  The legs when jogging.  Keep a record of:  Food intake before and after you exercise.  Expected peak times of insulin action.  Blood glucose levels before and after you exercise.  The type and amount of exercise you have done.  Review your records with your health care provider. Your health care provider will help you to develop guidelines for adjusting food intake and insulin amounts before and after exercising.  If you take insulin or oral hypoglycemic agents, watch for signs and symptoms of hypoglycemia. They include:  Dizziness.  Shaking.  Sweating.  Chills.  Confusion.  Drink plenty of water while you exercise to prevent dehydration or heat stroke. Body water is lost during exercise and must be replaced.  Talk to your health care provider before starting an exercise program to make sure it is safe for you. Remember, almost any type of activity is better than none. Document Released: 04/30/2003 Document Revised: 06/24/2013 Document Reviewed: 07/17/2012 [facility] Patient Information 2015 [place], [place]. This information is not intended to replace advice given to you by your health care provider. Make sure you discuss any questions you have with your health care provider. Correction Insulin Your health care provider has decided you need to take insulin regularly. You have been given a correction scale (  also called a sliding scale) in case you need extra insulin when your blood sugar is too high (hyperglycemia). The following instructions will  assist you in how to use that correction scale.  WHAT IS A CORRECTION SCALE?  When you check your blood sugar, sometimes it will be higher than your health care provider has told you it should be. You may need an extra dose of insulin to bring your blood sugar to the recommended level (also known as your goal, target, or normal level). The correction scale is prescribed by your health care provider based on your specific needs.  Your correction scale has two parts:   The first shows you a blood sugar range.   The second part tells you how much extra insulin to give yourself if your blood sugar falls within this range. You will not need an extra dose of insulin if your blood glucose is in the desired range. You should simply give yourself the normal amount of insulin that your health care provider has ordered for you.  WHY IS IT IMPORTANT TO KEEP YOUR BLOOD SUGAR LEVELS AT YOUR DESIRED LEVEL?  Keeping your blood sugar at the desired level helps to prevent long-term complications of diabetes, such as eye disease, kidney failure, nerve damage, and other serious complications. WHAT TYPE OF INSULIN WILL YOU USE?  To help bring down blood sugar levels that are too high, your health care provider will prescribe a short-acting or a rapid-acting insulin. An example of a short-acting insulin would be regular insulin. Remember, you may also have a longer-acting insulin prescribed for you.  WHAT DO YOU NEED TO DO?   Check your blood sugar with your home blood glucose meter as recommended by your health care provider.   Using your correction scale, find the range that your blood sugar lies in.   Look for the units of insulin that match that blood sugar range. Give yourself the dose of correction insulin your health care provider has prescribed. Always make sure you are using the right type of insulin.   Prior to the injection, make sure you have food available that you can eat in the next 15-30  minutes.   If your correction insulin is rapid acting, start eating your meal within 15 minutes after you have given yourself the insulin injection. If you wait longer than 15 minutes to eat, your blood sugar might get too low.   If your correction insulin is short acting(regular), start eating your meal within 30 minutes after you have given yourself the insulin injection. If you wait longer than 30 minutes to eat, your blood sugar might get too low. Symptoms of low blood sugar (hypoglycemia) may include feeling shaky or weak, sweating, feeling confused, difficulty seeing, agitation, crankiness, or numbness of the lips or tongue. Check your blood sugar immediately and treat your results as directed by your health care provider.   Keep a log of your blood sugar results with the time you took the test and the amount of insulin that you injected. This information will help your health care provider manage your medicines.   Note on your log anything that may affect your blood sugar level, such as:   Changes in normal exercise or activity.   Changes in your normal schedule, such as staying up late, going on vacation, changing your diet, or holidays.   New medicines. This includes prescription and over-the-counter medicines. Some medicines may cause high blood sugar.   Sickness, stress, or anxiety.  Changes in the time you took your medicine.   Changes in your meals, such as skipping a meal, having a late meal, or dining out.   Eating things that may affect blood glucose, such as snacks, meal portions that are larger than normal, drinks with sugar, or eating less than usual.   Ask your health care provider any questions you have.  Be aware of "stacking" your insulin doses. This happens when you correct a high blood sugar level by giving yourself extra insulin too soon after a previous correction dose or mealtime dose. You may then have too much insulin still active in your body and  may be at risk for hypoglycemia. WHY DO YOU NEED A CORRECTION SCALE IF YOU HAVE NEVER BEEN DIAGNOSED WITH DIABETES?   Keeping your blood glucose in the target range is important for your overall health.   You may have been prescribed medicines that cause your blood glucose to be higher than normal. WHEN SHOULD YOU SEEK MEDICAL CARE? Contact your health care provider if:   You have experienced hypoglycemia that you are unable to treat with your usual routine.   You have a high blood sugar level that is not coming down with the correction dose.  Your blood sugar is often too low or does not come up even if you eat a fast-acting carbohydrate. Someone who lives with you should seek immediate medical care if you become unresponsive. Document Released: 07/01/2010 Document Revised: 10/10/2012 Document Reviewed: 07/20/2012 [facility] Patient Information 2015 [place], [place]. This information is not intended to replace advice given to you by your health care provider. Make sure you discuss any questions you have with your health care provider.

## 2013-12-17 LAB — NMR, LIPOPROFILE
Cholesterol: 284 mg/dL — ABNORMAL HIGH (ref 100–199)
HDL CHOLESTEROL BY NMR: 42 mg/dL (ref >39)
HDL PARTICLE NUMBER: 32.4 umol/L (ref >=30.5)
LDL Particle Number: 2102 nmol/L — ABNORMAL HIGH (ref <1000)
LDL Size: 19.4 nm (ref >20.5)
LP-IR Score: 57 — ABNORMAL HIGH (ref <=45)
Small LDL Particle Number: 1751 nmol/L — ABNORMAL HIGH (ref <=527)
TRIGLYCERIDES BY NMR: 658 mg/dL — AB (ref 0–149)

## 2013-12-17 LAB — CMP14+EGFR
ALT: 18 IU/L (ref 0–44)
AST: 18 IU/L (ref 0–40)
Albumin/Globulin Ratio: 1.4 (ref 1.1–2.5)
Albumin: 3.6 g/dL (ref 3.5–5.5)
Alkaline Phosphatase: 88 IU/L (ref 39–117)
BUN/Creatinine Ratio: 30 — ABNORMAL HIGH (ref 9–20)
BUN: 24 mg/dL (ref 6–24)
CALCIUM: 9.7 mg/dL (ref 8.7–10.2)
CO2: 22 mmol/L (ref 18–29)
CREATININE: 0.79 mg/dL (ref 0.76–1.27)
Chloride: 105 mmol/L (ref 97–108)
GFR calc Af Amer: 115 mL/min/{1.73_m2} (ref >59)
GFR calc non Af Amer: 100 mL/min/{1.73_m2} (ref >59)
GLUCOSE: 98 mg/dL (ref 65–99)
Globulin, Total: 2.5 g/dL (ref 1.5–4.5)
Potassium: 4.1 mmol/L (ref 3.5–5.2)
SODIUM: 141 mmol/L (ref 134–144)
Total Bilirubin: <0.2 mg/dL (ref 0.0–1.2)
Total Protein: 6.1 g/dL (ref 6.0–8.5)

## 2013-12-20 ENCOUNTER — Other Ambulatory Visit: Payer: Self-pay | Admitting: General Practice

## 2013-12-24 ENCOUNTER — Other Ambulatory Visit: Payer: Self-pay | Admitting: Nurse Practitioner

## 2013-12-25 MED ORDER — FLUTICASONE PROPIONATE 50 MCG/ACT NA SUSP: NASAL | Status: DC

## 2013-12-25 MED ORDER — METFORMIN HCL 1000 MG PO TABS: 1000.0000 mg | ORAL_TABLET | Freq: Two times a day (BID) | ORAL | Status: DC

## 2013-12-25 NOTE — Telephone Encounter (Signed)
done

## 2014-01-01 ENCOUNTER — Ambulatory Visit (INDEPENDENT_AMBULATORY_CARE_PROVIDER_SITE_OTHER): Payer: Medicare Other | Admitting: Cardiovascular Disease

## 2014-01-01 ENCOUNTER — Encounter: Payer: Self-pay | Admitting: Cardiovascular Disease

## 2014-01-01 VITALS — BP 172/98 | HR 98 | Ht 69.0 in | Wt 208.0 lb

## 2014-01-01 DIAGNOSIS — Z716 Tobacco abuse counseling: Secondary | ICD-10-CM | POA: Diagnosis not present

## 2014-01-01 DIAGNOSIS — I429 Cardiomyopathy, unspecified: Secondary | ICD-10-CM | POA: Diagnosis not present

## 2014-01-01 DIAGNOSIS — I25119 Atherosclerotic heart disease of native coronary artery with unspecified angina pectoris: Secondary | ICD-10-CM

## 2014-01-01 DIAGNOSIS — I10 Essential (primary) hypertension: Secondary | ICD-10-CM

## 2014-01-01 DIAGNOSIS — I5022 Chronic systolic (congestive) heart failure: Secondary | ICD-10-CM

## 2014-01-01 DIAGNOSIS — E785 Hyperlipidemia, unspecified: Secondary | ICD-10-CM

## 2014-01-01 MED ORDER — AMLODIPINE BESYLATE 5 MG PO TABS: 5.0000 mg | ORAL_TABLET | Freq: Every day | ORAL | Status: DC

## 2014-01-01 MED ORDER — ROSUVASTATIN CALCIUM 20 MG PO TABS: 20.0000 mg | ORAL_TABLET | Freq: Every day | ORAL | Status: DC

## 2014-01-01 MED ORDER — CARVEDILOL 25 MG PO TABS: 25.0000 mg | ORAL_TABLET | Freq: Two times a day (BID) | ORAL | Status: DC

## 2014-01-01 NOTE — Patient Instructions (Addendum)
Your physician recommends that you schedule a follow-up appointment in: 6 months. You will receive a reminder letter in the mail in about 4 months reminding you to call and schedule your appointment. If you don't receive this letter, please contact our office. Your physician has recommended you make the following change in your medication:  Increase your amlodipine to 5 mg daily. You may take (2) of your 2.5 mg tablets daily until they are finished. Increase your carvedilol to 25 mg twice daily. You may take (2) of your 12.5 mg tablets twice daily until they are finished. Increase crestor to 20 mg daily. You may take (2) of your 10 mg daily until they are finished. Continue all other medications the same.

## 2014-01-01 NOTE — Progress Notes (Signed)
Patient ID: Brad Mendoza, male   DOB: [birth year], 58 y.o.   MRN: [id number]      SUBJECTIVE: The patient is here to followup for chronic systolic heart failure, coronary artery disease, hypertension and hyperlipidemia. He has a history of a nonischemic cardiomyopathy. Echocardiogram in October 2014 demonstrated mildly reduced left ventricular systolic function, EF [id number], with grade 1 diastolic dysfunction, moderate left atrial and mild right atrial enlargement. Nuclear stress testing in October 2014 demonstrated anterior wall and anteroseptal infarcts with no suggestion of ischemia. He has only had to use nitroglycerin on two occasions since his last visit with me. He has been using chewing tobacco for the past 4 months. He has had occasional left-sided chest pressure with left arm soreness. He is occasionally bothered by right should soreness exacerbated when lying in bed.   Review of Systems: As per "subjective", otherwise negative.  Allergies  Allergen Reactions  . Sulfa Antibiotics Shortness Of Breath  . Penicillins Anxiety    REACTION: rash    Current Outpatient Prescriptions  Medication Sig Dispense Refill  . amLODipine (NORVASC) 2.5 MG tablet Take 1 tablet (2.5 mg total) by mouth daily. 30 tablet 3  . aspirin EC 81 MG tablet Take 81 mg by mouth daily.    . carvedilol (COREG) 12.5 MG tablet Take 1 tablet (12.5 mg total) by mouth 2 (two) times daily. 60 tablet 6  . cetirizine (ZYRTEC) 10 MG tablet Take 1 tablet (10 mg total) by mouth daily. 30 tablet 5  . CRESTOR 10 MG tablet TAKE ONE (1) TABLET EACH DAY 30 tablet 3  . esomeprazole (NEXIUM) 40 MG capsule Take 1 capsule (40 mg total) by mouth daily before breakfast. 30 capsule 4  . fluticasone (FLONASE) 50 MCG/ACT nasal spray USE 2 SPRAYS IN EACH NOSTRIL DAILY 16 g 5  . furosemide (LASIX) 40 MG tablet Take 1 tablet (40 mg total) by mouth daily. 30 tablet 3  . glucose blood (ACCU-CHEK AVIVA PLUS) test strip Use as instructed 100 each  0  . insulin aspart (NOVOLOG FLEXPEN) 100 UNIT/ML FlexPen Inject up to 15 units prior to each meal and as needed for elevated blood glucose 15 mL 2  . Insulin Detemir (LEVEMIR FLEXTOUCH) 100 UNIT/ML Pen Inject 45 units each night at bedtime 15 mL 2  . Insulin Pen Needle (PEN NEEDLES 3/16") 31G X 5 MM MISC Use with insulin pens qid. Dx:  250.03 100 each 2  . Insulin Syringe-Needle U-100 (B-D INS SYRINGE 0.5CC/30GX1/2") 30G X 1/2" 0.5 ML MISC Use as directed 100 each 0  . isosorbide mononitrate (IMDUR) 60 MG 24 hr tablet Take 1 tablet (60 mg total) by mouth daily. 30 tablet 6  . lisinopril (PRINIVIL,ZESTRIL) 40 MG tablet Take 1 tablet (40 mg total) by mouth daily. 30 tablet 5  . loratadine (CLARITIN) 10 MG tablet Take 1 tablet (10 mg total) by mouth daily. 90 tablet 3  . metFORMIN (GLUCOPHAGE) 1000 MG tablet Take 1 tablet (1,000 mg total) by mouth 2 (two) times daily with a meal. 60 tablet 2  . Multiple Vitamins-Minerals (CENTRUM SILVER ADULT 50+ PO) Take 1 tablet by mouth daily.    . nitroGLYCERIN (NITROSTAT) 0.4 MG SL tablet Place 1 tablet (0.4 mg total) under the tongue every 5 (five) minutes as needed. 30 tablet 1  . omega-3 fish oil (MAXEPA) 1000 MG CAPS capsule Take 1 capsule by mouth daily.  90 each    No current facility-administered medications for this visit.    Past  Medical History  Diagnosis Date  . CHF (congestive heart failure)   . HTN (hypertension)   . Acid reflux   . Onychomycosis   . Back pain     f4 and f5  . Diverticulosis   . Hyperlipidemia   . Diabetes mellitus 2005    Past Surgical History  Procedure Laterality Date  . Appendectomy    . Colonoscopy N/A 08/01/2013    Procedure: COLONOSCOPY;  Surgeon: Rogene Houston, MD;  Location: [facility];  Service: Endoscopy;  Laterality: N/A;  rescheduled to [place] notified pt    History   Social History  . Marital Status: Married    Spouse Name: N/A    Number of Children: N/A  . Years of Education: N/A    Occupational History  . Not on file.   Social History Main Topics  . Smoking status: Never Smoker   . Smokeless tobacco: Current User    Types: Snuff     Comment: dips snuff about 6 cans/week for 3 years  . Alcohol Use: 0.6 oz/week    1 Cans of beer per week  . Drug Use: Yes    Special: Marijuana     Comment: last used 07/31/13 per pt-61/11/15  . Sexual Activity: Not on file   Other Topics Concern  . Not on file   Social History Narrative     Filed Vitals:   01/01/14 1421  BP: 172/98  Pulse: 98  Height: 5\' 9"  (1.753 m)  Weight: 208 lb (94.348 kg)    PHYSICAL EXAM General: NAD HEENT: Normal. Neck: No JVD, no thyromegaly. Lungs: Clear to auscultation bilaterally with normal respiratory effort. CV: Nondisplaced PMI.  Regular rate and rhythm, normal S1/S2, no S3/S4, no murmur. No pretibial or periankle edema.  No carotid bruit.  Normal pedal pulses.  Abdomen: Soft, nontender, no hepatosplenomegaly, no distention.  Neurologic: Alert and oriented x 3.  Psych: Normal affect. Skin: Normal. Musculoskeletal: Normal range of motion, no gross deformities. Extremities: No clubbing or cyanosis.   ECG: Most recent ECG reviewed.      ASSESSMENT AND PLAN: 1. Chest pain in the setting of known CAD (50% prior LAD lesion): Possibly due to markedly elevated BP. Will increase Coreg and amlodipine which should also provide anginal relief. Previous stress test showed only anterior and anteroseptal wall infarct without ischemia. Continue ASA and Crestor along with Imdur. Tobacco cessation emphasized.  2. Essential HTN: Poorly controlled on present therapy. Will increase amlodipine to 5 mg daily and Coreg to 25 mg bid.   3. Hyperlipidemia: Elevated total cholesterol and triglycerides on 12/16/13. Increase Crestor to 20 mg daily.   4. Chronic systolic heart failure: Well compensated on current medical therapy. EF improved to 40-45% in 11/2012. Continue Lasix 40 mg daily along with  Coreg and lisinopril.  5. Tobacco abuse: Cessation counseling provided.  Dispo: f/u 6 months.   Kate Sable, M.D., F.A.C.C.

## 2014-01-01 NOTE — Addendum Note (Signed)
Addended by: Merlene Laughter on: 01/01/2014 02:45 PM   Modules accepted: Orders, Medications

## 2014-01-02 ENCOUNTER — Ambulatory Visit (INDEPENDENT_AMBULATORY_CARE_PROVIDER_SITE_OTHER): Payer: Medicare Other | Admitting: Internal Medicine

## 2014-01-02 ENCOUNTER — Encounter (INDEPENDENT_AMBULATORY_CARE_PROVIDER_SITE_OTHER): Payer: Self-pay | Admitting: Internal Medicine

## 2014-01-02 VITALS — BP 140/68 | HR 64 | Temp 98.2°F | Ht 69.0 in | Wt 211.2 lb

## 2014-01-02 DIAGNOSIS — K625 Hemorrhage of anus and rectum: Secondary | ICD-10-CM | POA: Diagnosis not present

## 2014-01-02 DIAGNOSIS — I2510 Atherosclerotic heart disease of native coronary artery without angina pectoris: Secondary | ICD-10-CM | POA: Diagnosis not present

## 2014-01-02 NOTE — Progress Notes (Signed)
Subjective:    Patient ID: Michae Mendoza, male    DOB: 1955/05/24, 58 y.o.   MRN: [id number]  HPI Here today for f/u. Underwent colooscopy 08/01/2013. See below.  He tells me he is doing good. Appetite is good. No weight loss. No abdominal pain. He usually has a BM about x 2 day. Normal caliber. No melena or BRRB.   08/01/2013 Colonoscopy: Heme positive stool, rectal pain: Impression:  Examination performed to cecum. Scattered right-sided and sigmoid colon diverticula. Four small polyps removed as above and submitted together; two at hepatic flexure; one at proximal transverse colon and the fourth one at sigmoid colon. Small external hemorrhoids. No evidence of proctitis. Patient had 4 small polyps removed and these are tubular adenomas Results given to patient Next colonoscopy in 5 years Patient will keep symptom diary as to frequency of rectal pain and return office visit in 3 months. Report to PCP   Review of Systems   Past Medical History  Diagnosis Date  . CHF (congestive heart failure)   . HTN (hypertension)   . Acid reflux   . Onychomycosis   . Back pain     f4 and f5  . Diverticulosis   . Hyperlipidemia   . Diabetes mellitus 2005    Past Surgical History  Procedure Laterality Date  . Appendectomy    . Colonoscopy N/A 08/01/2013    Procedure: COLONOSCOPY;  Surgeon: Rogene Houston, MD;  Location: [facility];  Service: Endoscopy;  Laterality: N/A;  rescheduled to [place] notified pt    Allergies  Allergen Reactions  . Sulfa Antibiotics Shortness Of Breath  . Penicillins Anxiety    REACTION: rash    Current Outpatient Prescriptions on File Prior to Visit  Medication Sig Dispense Refill  . amLODipine (NORVASC) 5 MG tablet Take 1 tablet (5 mg total) by mouth daily. 90 tablet 3  . aspirin EC 81 MG tablet Take 81 mg by mouth daily.    . carvedilol (COREG) 25 MG tablet Take 1 tablet (25 mg total) by mouth 2 (two) times daily. 180 tablet 3  . cetirizine  (ZYRTEC) 10 MG tablet Take 1 tablet (10 mg total) by mouth daily. 30 tablet 5  . esomeprazole (NEXIUM) 40 MG capsule Take 1 capsule (40 mg total) by mouth daily before breakfast. 30 capsule 4  . fluticasone (FLONASE) 50 MCG/ACT nasal spray USE 2 SPRAYS IN EACH NOSTRIL DAILY 16 g 5  . furosemide (LASIX) 40 MG tablet Take 1 tablet (40 mg total) by mouth daily. 30 tablet 3  . glucose blood (ACCU-CHEK AVIVA PLUS) test strip Use as instructed 100 each 0  . insulin aspart (NOVOLOG FLEXPEN) 100 UNIT/ML FlexPen Inject up to 15 units prior to each meal and as needed for elevated blood glucose 15 mL 2  . Insulin Detemir (LEVEMIR FLEXTOUCH) 100 UNIT/ML Pen Inject 45 units each night at bedtime 15 mL 2  . Insulin Pen Needle (PEN NEEDLES 3/16") 31G X 5 MM MISC Use with insulin pens qid. Dx:  250.03 100 each 2  . Insulin Syringe-Needle U-100 (B-D INS SYRINGE 0.5CC/30GX1/2") 30G X 1/2" 0.5 ML MISC Use as directed 100 each 0  . isosorbide mononitrate (IMDUR) 60 MG 24 hr tablet Take 1 tablet (60 mg total) by mouth daily. 30 tablet 6  . lisinopril (PRINIVIL,ZESTRIL) 40 MG tablet Take 1 tablet (40 mg total) by mouth daily. 30 tablet 5  . loratadine (CLARITIN) 10 MG tablet Take 1 tablet (10 mg total)  by mouth daily. 90 tablet 3  . metFORMIN (GLUCOPHAGE) 1000 MG tablet Take 1 tablet (1,000 mg total) by mouth 2 (two) times daily with a meal. 60 tablet 2  . Multiple Vitamins-Minerals (CENTRUM SILVER ADULT 50+ PO) Take 1 tablet by mouth daily.    . nitroGLYCERIN (NITROSTAT) 0.4 MG SL tablet Place 1 tablet (0.4 mg total) under the tongue every 5 (five) minutes as needed. 30 tablet 1  . omega-3 fish oil (MAXEPA) 1000 MG CAPS capsule Take 2 capsules by mouth daily.  90 each   . rosuvastatin (CRESTOR) 20 MG tablet Take 1 tablet (20 mg total) by mouth daily. 90 tablet 3   No current facility-administered medications on file prior to visit.        Objective:   Physical Exam  Filed Vitals:   01/02/14 1022  Height: 5\' 9"   (1.753 m)  Weight: 211 lb 3.2 oz (95.8 kg)   Alert and oriented. Skin warm and dry. Oral mucosa is moist.   . Sclera anicteric, conjunctivae is pink. Thyroid not enlarged. No cervical lymphadenopathy. Lungs clear. Heart regular rate and rhythm.  Abdomen is soft. Bowel sounds are positive. No hepatomegaly. No abdominal masses felt. No tenderness.  No edema to lower extremities.        Assessment & Plan:  Rectal bleeding. Resolved. Recent colonoscopy revealed tubular adenoma and external hemorrhoids. OV in 1 yr. Stool softener as needed.

## 2014-01-02 NOTE — Patient Instructions (Signed)
OV in 1 yr. 

## 2014-01-13 NOTE — Addendum Note (Signed)
Addended by: Pollyann Kennedy F on: 01/13/2014 10:45 AM   Modules accepted: Orders

## 2014-01-20 ENCOUNTER — Ambulatory Visit (INDEPENDENT_AMBULATORY_CARE_PROVIDER_SITE_OTHER): Payer: Medicare Other | Admitting: Nurse Practitioner

## 2014-01-20 ENCOUNTER — Encounter: Payer: Self-pay | Admitting: Nurse Practitioner

## 2014-01-20 VITALS — BP 176/81 | HR 91 | Temp 98.1°F | Ht 69.0 in | Wt 220.2 lb

## 2014-01-20 DIAGNOSIS — E1142 Type 2 diabetes mellitus with diabetic polyneuropathy: Secondary | ICD-10-CM | POA: Diagnosis not present

## 2014-01-20 DIAGNOSIS — E118 Type 2 diabetes mellitus with unspecified complications: Secondary | ICD-10-CM | POA: Diagnosis not present

## 2014-01-20 DIAGNOSIS — E1140 Type 2 diabetes mellitus with diabetic neuropathy, unspecified: Secondary | ICD-10-CM | POA: Diagnosis not present

## 2014-01-20 LAB — POCT GLYCOSYLATED HEMOGLOBIN (HGB A1C)

## 2014-01-20 MED ORDER — ESOMEPRAZOLE MAGNESIUM 40 MG PO CPDR: 40.0000 mg | DELAYED_RELEASE_CAPSULE | Freq: Every day | ORAL | Status: DC

## 2014-01-20 MED ORDER — INSULIN DETEMIR 100 UNIT/ML FLEXPEN: PEN_INJECTOR | SUBCUTANEOUS | Status: DC

## 2014-01-20 NOTE — Patient Instructions (Signed)

## 2014-01-20 NOTE — Progress Notes (Signed)
Subjective:    Patient ID: Brad Mendoza, male    DOB: 07-04-1955, 58 y.o.   MRN: [id number]  HPI Patient was seen 1 month ago and blood sugar was way out of control- His Hgba1c was > 14- He was told to take his levemir qhs and was increased to 45units  and to do his sliding scale 3x a day. He says that his blood sugars have been running around 160 fasting-   * Blood pressure elevated today- says when he takes all of his blood pressure meds in the morning he feels weak a few hours later and sometimes he doesn't take it all because he feels bad. DID NOT TAKE BLOOD PRESSURE MEDS THIS MORNING  Review of Systems  Constitutional: Negative.   HENT: Negative.   Respiratory: Negative.   Cardiovascular: Negative.   Genitourinary: Negative.   Neurological: Negative.   Psychiatric/Behavioral: Negative.   All other systems reviewed and are negative.      Objective:   Physical Exam  Constitutional: He is oriented to person, place, and time. He appears well-developed and well-nourished.  HENT:  Head: Normocephalic.  Right Ear: External ear normal.  Left Ear: External ear normal.  Nose: Nose normal.  Mouth/Throat: Oropharynx is clear and moist.  Eyes: EOM are normal. Pupils are equal, round, and reactive to light.  Neck: Normal range of motion. Neck supple. No JVD present. No thyromegaly present.  Cardiovascular: Normal rate, regular rhythm, normal heart sounds and intact distal pulses.  Exam reveals no gallop and no friction rub.   No murmur heard. Pulmonary/Chest: Effort normal and breath sounds normal. No respiratory distress. He has no wheezes. He has no rales. He exhibits no tenderness.  Abdominal: Soft. Bowel sounds are normal. He exhibits no mass. There is no tenderness.  Genitourinary: Prostate normal and penis normal.  Musculoskeletal: Normal range of motion. He exhibits no edema.  Lymphadenopathy:    He has no cervical adenopathy.  Neurological: He is alert and oriented to  person, place, and time. No cranial nerve deficit.  Skin: Skin is warm and dry.  Psychiatric: He has a normal mood and affect. His behavior is normal. Judgment and thought content normal.    BP 176/81 mmHg  Pulse 91  Temp(Src) 98.1 F (36.7 C) (Oral)  Ht 5\' 9"  (1.753 m)  Wt 220 lb 4 oz (99.905 kg)  BMI 32.51 kg/m2  Results for orders placed or performed in visit on 01/20/14  POCT glycosylated hemoglobin (Hb A1C)  Result Value Ref Range   Hemoglobin A1C 14.0%          Assessment & Plan:   1. Type 2 diabetes mellitus with complications   2. Type 2 diabetes mellitus with diabetic polyneuropathy    Meds ordered this encounter  Medications  . esomeprazole (NEXIUM) 40 MG capsule    Sig: Take 1 capsule (40 mg total) by mouth daily at 12 noon.    Dispense:  30 capsule    Refill:  5    Order Specific Question:  Supervising Provider    Answer:  Chipper Herb [[id number]]  . Insulin Detemir (LEVEMIR FLEXTOUCH) 100 UNIT/ML Pen    Sig: Inject 50 u Qhs X3 then increase to 55u qhs.    Dispense:  15 mL    Refill:  2    Order Specific Question:  Supervising Provider    Answer:  Joycelyn Man   Appointment with clinical pharmacist to discuss diabetes DO NOT FORGET TO TAKE  BLOOD PRESSURE MEDS DAILY. Low NA diet Follow up in 3 months  [place], FNP

## 2014-01-21 LAB — MICROALBUMIN, URINE: Microalbumin, Urine: 678.7 ug/mL — ABNORMAL HIGH (ref 0.0–17.0)

## 2014-01-27 ENCOUNTER — Ambulatory Visit: Payer: Medicaid Other

## 2014-02-06 ENCOUNTER — Ambulatory Visit (INDEPENDENT_AMBULATORY_CARE_PROVIDER_SITE_OTHER): Payer: Medicare Other | Admitting: Pharmacist

## 2014-02-06 ENCOUNTER — Encounter: Payer: Self-pay | Admitting: Pharmacist

## 2014-02-06 VITALS — BP 132/88 | HR 78 | Ht 69.0 in | Wt 216.0 lb

## 2014-02-06 DIAGNOSIS — E669 Obesity, unspecified: Secondary | ICD-10-CM

## 2014-02-06 DIAGNOSIS — I2510 Atherosclerotic heart disease of native coronary artery without angina pectoris: Secondary | ICD-10-CM | POA: Diagnosis not present

## 2014-02-06 DIAGNOSIS — E118 Type 2 diabetes mellitus with unspecified complications: Secondary | ICD-10-CM

## 2014-02-06 DIAGNOSIS — I10 Essential (primary) hypertension: Secondary | ICD-10-CM | POA: Diagnosis not present

## 2014-02-06 DIAGNOSIS — R635 Abnormal weight gain: Secondary | ICD-10-CM | POA: Diagnosis not present

## 2014-02-06 MED ORDER — "PEN NEEDLES 3/16"" 31G X 5 MM MISC": Status: DC

## 2014-02-06 NOTE — Patient Instructions (Addendum)
Regular / Novolog (orange)  Insulin dose - sliding scale. Check Blood glucose prior to each meal and given insulin according to below  Less than 80 - no insulin  80 to 120 - 8 units  121- 200 - 9 units  201 - 250 - 10 units  251 - 300 - 11 units  301 - 350 - 12 units  351 or above - 14 units  Increase Levemir (green) to 60 units at bedtime     Diabetes and Standards of Medical Care   Diabetes is complicated. You may find that your diabetes team includes a dietitian, nurse, diabetes educator, eye doctor, and more. To help everyone know what is going on and to help you get the care you deserve, the following schedule of care was developed to help keep you on track. Below are the tests, exams, vaccines, medicines, education, and plans you will need.  Blood Glucose Goals Prior to meals = 80 - 130 Within 2 hours of the start of a meal = less than 180  HbA1c test (goal is less than 7.0% - your last value was %) This test shows how well you have controlled your glucose over the past 2 to 3 months. It is used to see if your diabetes management plan needs to be adjusted.   It is performed at least 2 times a year if you are meeting treatment goals.  It is performed 4 times a year if therapy has changed or if you are not meeting treatment goals.  Blood pressure test  This test is performed at every routine medical visit. The goal is less than 140/90 mmHg for most people, but 130/80 mmHg in some cases. Ask your health care provider about your goal.  Dental exam  Follow up with the dentist regularly.  Eye exam  If you are diagnosed with type 1 diabetes as a child, get an exam upon reaching the age of 51 years or older and have had diabetes for 3 to 5 years. Yearly eye exams are recommended after that initial eye exam.  If you are diagnosed with type 1 diabetes as an adult, get an exam within 5 years of diagnosis and then yearly.  If you are diagnosed with type 2 diabetes, get an exam as  soon as possible after the diagnosis and then yearly.  Foot care exam  Visual foot exams are performed at every routine medical visit. The exams check for cuts, injuries, or other problems with the feet.  A comprehensive foot exam should be done yearly. This includes visual inspection as well as assessing foot pulses and testing for loss of sensation.  Check your feet nightly for cuts, injuries, or other problems with your feet. Tell your health care provider if anything is not healing.  Kidney function test (urine microalbumin)  This test is performed once a year.  Type 1 diabetes: The first test is performed 5 years after diagnosis.  Type 2 diabetes: The first test is performed at the time of diagnosis.  A serum creatinine and estimated glomerular filtration rate (eGFR) test is done once a year to assess the level of chronic kidney disease (CKD), if present.  Lipid profile (cholesterol, HDL, LDL, triglycerides)  Performed every 5 years for most people.  The goal for LDL is less than 100 mg/dL. If you are at high risk, the goal is less than 70 mg/dL.  The goal for HDL is 40 mg/dL to 50 mg/dL for men and 50 mg/dL to  60 mg/dL for women. An HDL cholesterol of 60 mg/dL or higher gives some protection against heart disease.  The goal for triglycerides is less than 150 mg/dL.  Influenza vaccine, pneumococcal vaccine, and hepatitis B vaccine  The influenza vaccine is recommended yearly.  The pneumococcal vaccine is generally given once in a lifetime. However, there are some instances when another vaccination is recommended. Check with your health care provider.  The hepatitis B vaccine is also recommended for adults with diabetes.  Diabetes self-management education  Education is recommended at diagnosis and ongoing as needed.  Treatment plan  Your treatment plan is reviewed at every medical visit.  Document Released: 12/05/2008 Document Revised: 10/10/2012 Document Reviewed:  07/10/2012 [facility] Patient Information 2014 [place].

## 2014-02-06 NOTE — Progress Notes (Signed)
Diabetes Visit  Chief Complaint:   Chief Complaint  Patient presents with  . Diabetes     Filed Vitals:   02/06/14 1444  BP: 132/88  Pulse: 78   Filed Weights   02/06/14 1444  Weight: 216 lb (97.977 kg)   Body mass index is 31.88 kg/(m^2).  HPI: patient with uncontrolled DM diagnosed around 2007.   Last visit with me was July 2015.  Patient's most recent A1c was 14% which has not changed compared to July 2015 Current Diabetes Medications:  Metfromin 1000mg  BID, Levemir insulin 55 units qhs and Regular insulin per sliding scale  Regular / Novolog Insulin dose - sliding scale. Check Blood glucose prior to each meal and given insulin according to below  Less than 80 - no insulin  80 to 120 - 8 units  121- 200 - 9 units  201 - 250 - 10 units  251 - 300 - 11 units  301 - 350 - 12 units  351 or above - 14 units  Did not bring in glucometer but reports HBG ranging from 65 to 500.  Low fat/carbohydrate diet?  No - trying to decrease CHO and sugar in take but patient admits to moments of indiscretion Nicotine Abuse?  No Medication Compliance?  No - admits to forgetting insulin about 2-3 times weekly Exercise?  No Alcohol use?  Yes - 1 to 2 beer per day  Exam Edema:  negative  Polyuria:  Positive at night  Polydipsia:  negative Polyphagia:  negataive  Weight changes:  Decreased 4# over last 2 weeks General Appearance:  alert, oriented, no acute distress and obese Mood/Affect:  normal    Lab Results  Component Value Date   HGBA1C 14.0% 01/20/2014     Assessment: Type 2 diabetes requiring insulin - inadequately controlled Obesity - weight decreased but could be related to poorly controlled DM HTN - controlled based on today's BP  Recommendations: 1.  Medication recommendations at this time are as follows:    Increase Levemir to 60 units at bedtime - also changed to pens  Regular / Novolog Insulin dose - sliding scale. Check Blood glucose prior to each meal and given  insulin according to below  Less than 80 - no insulin  80 to 120 - 8 units  121- 200 - 9 units  201 - 250 - 10 units  250 - 300 - 11 units  301 - 350 - 12 units  351 or above - 14 units   2. Spent 15 minutes discussing compliance and barriers to compliance.  Identified some areas to help improve compliance.    Education about 2 types of insulin - long vs short and when is the appropriate time to use each.    Reminder about complications of diabetes and how important adequate control is in preventing complications.  Reminded patient that I am here to assist him in better BG control.  He can call and we can adjust insulin over phone if BG is not improving or if he has low BG less than 70 or high BG over 300 more than 2 times per week.  It is also important to keep follow up appointments.    3. Reviewed HBG goals:  Fasting 80-130 and 1-2 hour post prandial <180.  Patient is instructed to check BG 4 times per day.    4.  Dietary recommendations:  Reviewed CHO counting  - gave picture depiction of serving sizes and high CHO foods  Discussed alcohol's effect  on BG - advised patient to eat a small snack (about 10 to 15 grams of carbohydrates with any alcoholic drinks to prevent hypoglycemia 5.  Physical Activity recommendations:  Continue gardening and be active 6.  Return to clinic in 4 weeks   Time spent counseling patient:  45 minutes   Cherre Robins, PharmD, CPP, CDE

## 2014-02-07 ENCOUNTER — Ambulatory Visit: Payer: Medicaid Other | Admitting: Family Medicine

## 2014-02-19 ENCOUNTER — Other Ambulatory Visit: Payer: Self-pay

## 2014-02-19 MED ORDER — INSULIN DETEMIR 100 UNIT/ML FLEXPEN: PEN_INJECTOR | SUBCUTANEOUS | Status: DC

## 2014-02-20 DIAGNOSIS — E119 Type 2 diabetes mellitus without complications: Secondary | ICD-10-CM | POA: Diagnosis not present

## 2014-02-20 DIAGNOSIS — G44219 Episodic tension-type headache, not intractable: Secondary | ICD-10-CM | POA: Diagnosis not present

## 2014-02-20 LAB — HM DIABETES EYE EXAM

## 2014-03-05 ENCOUNTER — Encounter: Payer: Self-pay | Admitting: *Deleted

## 2014-03-06 ENCOUNTER — Telehealth: Payer: Self-pay | Admitting: Family Medicine

## 2014-03-06 NOTE — Telephone Encounter (Signed)
Pt given appt for tomorrow morning with Dietrich Pates at 8:45, pt was offered appt this afternoon but pt couldn't get a ride.

## 2014-03-07 ENCOUNTER — Ambulatory Visit (INDEPENDENT_AMBULATORY_CARE_PROVIDER_SITE_OTHER): Payer: Medicare Other

## 2014-03-07 ENCOUNTER — Encounter: Payer: Self-pay | Admitting: Family Medicine

## 2014-03-07 ENCOUNTER — Ambulatory Visit (INDEPENDENT_AMBULATORY_CARE_PROVIDER_SITE_OTHER): Payer: Medicare Other | Admitting: Family Medicine

## 2014-03-07 VITALS — BP 146/84 | HR 92 | Temp 100.2°F | Ht 69.0 in | Wt 219.4 lb

## 2014-03-07 DIAGNOSIS — J189 Pneumonia, unspecified organism: Secondary | ICD-10-CM | POA: Diagnosis not present

## 2014-03-07 DIAGNOSIS — R059 Cough, unspecified: Secondary | ICD-10-CM

## 2014-03-07 DIAGNOSIS — R05 Cough: Secondary | ICD-10-CM | POA: Diagnosis not present

## 2014-03-07 MED ORDER — HYDROCODONE-HOMATROPINE 5-1.5 MG/5ML PO SYRP: 5.0000 mL | ORAL_SOLUTION | Freq: Three times a day (TID) | ORAL | Status: DC | PRN

## 2014-03-07 MED ORDER — LEVOFLOXACIN 500 MG PO TABS: 500.0000 mg | ORAL_TABLET | Freq: Every day | ORAL | Status: DC

## 2014-03-07 MED ORDER — PREDNISONE 20 MG PO TABS: 20.0000 mg | ORAL_TABLET | Freq: Every day | ORAL | Status: DC

## 2014-03-07 NOTE — Progress Notes (Signed)
   Subjective:    Patient ID: [name], male    DOB: 06-Oct-1955, 59 y.o.   MRN: [id number]  HPI Patient is here for c/o cough and fever.  Review of Systems  Constitutional: Negative for fever.  HENT: Negative for ear pain.   Eyes: Negative for discharge.  Respiratory: Negative for cough.   Cardiovascular: Negative for chest pain.  Gastrointestinal: Negative for abdominal distention.  Endocrine: Negative for polyuria.  Genitourinary: Negative for difficulty urinating.  Musculoskeletal: Negative for gait problem and neck pain.  Skin: Negative for color change and rash.  Neurological: Negative for speech difficulty and headaches.  Psychiatric/Behavioral: Negative for agitation.       Objective:    BP 146/84 mmHg  Pulse 92  Temp(Src) 100.2 F (37.9 C) (Oral)  Ht 5\' 9"  (1.753 m)  Wt 219 lb 6.4 oz (99.519 kg)  BMI 32.38 kg/m2 Physical Exam  Constitutional: He is oriented to person, place, and time. He appears well-developed and well-nourished.  HENT:  Head: Normocephalic and atraumatic.  Mouth/Throat: Oropharynx is clear and moist.  Eyes: Pupils are equal, round, and reactive to light.  Neck: Normal range of motion. Neck supple.  Cardiovascular: Normal rate and regular rhythm.   No murmur heard. Pulmonary/Chest: Effort normal and breath sounds normal.  Abdominal: Soft. Bowel sounds are normal. There is no tenderness.  Neurological: He is alert and oriented to person, place, and time.  Skin: Skin is warm and dry.  Psychiatric: He has a normal mood and affect.     CXR - RUL infiltrate Prelimnary reading by Iverson Alamin     Assessment & Plan:     ICD-9-CM ICD-10-CM   1. Cough 786.2 R05 DG Chest 2 View     levofloxacin (LEVAQUIN) 500 MG tablet     predniSONE (DELTASONE) 20 MG tablet     HYDROcodone-homatropine (HYCODAN) 5-1.5 MG/5ML syrup  2. CAP (community acquired pneumonia) 486 J18.9 levofloxacin (LEVAQUIN) 500 MG tablet     predniSONE (DELTASONE) 20  MG tablet     HYDROcodone-homatropine (HYCODAN) 5-1.5 MG/5ML syrup     No Follow-up on file.  Lysbeth Penner FNP

## 2014-03-11 ENCOUNTER — Other Ambulatory Visit: Payer: Self-pay | Admitting: Family Medicine

## 2014-03-14 ENCOUNTER — Ambulatory Visit: Payer: Self-pay

## 2014-03-28 ENCOUNTER — Ambulatory Visit: Payer: Medicare Other

## 2014-04-14 ENCOUNTER — Ambulatory Visit: Payer: Self-pay

## 2014-04-21 ENCOUNTER — Ambulatory Visit: Payer: Medicare Other

## 2014-05-14 ENCOUNTER — Ambulatory Visit (INDEPENDENT_AMBULATORY_CARE_PROVIDER_SITE_OTHER): Payer: Medicare Other | Admitting: Pharmacist

## 2014-05-14 ENCOUNTER — Encounter: Payer: Self-pay | Admitting: Pharmacist

## 2014-05-14 VITALS — BP 140/88 | HR 78 | Ht 68.5 in | Wt 219.0 lb

## 2014-05-14 DIAGNOSIS — E118 Type 2 diabetes mellitus with unspecified complications: Secondary | ICD-10-CM

## 2014-05-14 DIAGNOSIS — R358 Other polyuria: Secondary | ICD-10-CM

## 2014-05-14 DIAGNOSIS — Z0000 Encounter for general adult medical examination without abnormal findings: Secondary | ICD-10-CM

## 2014-05-14 DIAGNOSIS — Z23 Encounter for immunization: Secondary | ICD-10-CM | POA: Diagnosis not present

## 2014-05-14 DIAGNOSIS — Z125 Encounter for screening for malignant neoplasm of prostate: Secondary | ICD-10-CM

## 2014-05-14 DIAGNOSIS — R3589 Other polyuria: Secondary | ICD-10-CM

## 2014-05-14 LAB — POCT GLYCOSYLATED HEMOGLOBIN (HGB A1C): Hemoglobin A1C: 13.6

## 2014-05-14 LAB — GLUCOSE, POCT (MANUAL RESULT ENTRY): POC GLUCOSE: 370 mg/dL — AB (ref 70–99)

## 2014-05-14 MED ORDER — INSULIN DETEMIR 100 UNIT/ML FLEXPEN: PEN_INJECTOR | SUBCUTANEOUS | Status: DC

## 2014-05-14 MED ORDER — RANITIDINE HCL 150 MG PO TABS: 150.0000 mg | ORAL_TABLET | Freq: Two times a day (BID) | ORAL | Status: DC

## 2014-05-14 NOTE — Progress Notes (Signed)
Patient ID: Brad Mendoza, male   DOB: 1955-06-01, 59 y.o.   MRN: [id number]   Subjective:   Brad Mendoza is a 59 y.o. male who presents for an Initial Medicare Annual Wellness Visit and follow up diabetes  Patient reports nocturia and numbness in lower extremities.  Patient reports improved BG - currently taking Levemir 60 units daily and Novolog prior to each meal based on siding scale and metformin 1027m bid.  HBG readings 150 to 350's.  He reports 1 hypoglycemic event which occurred when he was outside working.   Current Medications (verified) Outpatient Encounter Prescriptions as of 05/14/2014  Medication Sig  . amLODipine (NORVASC) 5 MG tablet Take 1 tablet (5 mg total) by mouth daily.  .Marland Kitchenaspirin EC 81 MG tablet Take 81 mg by mouth daily.  . carvedilol (COREG) 25 MG tablet Take 1 tablet (25 mg total) by mouth 2 (two) times daily.  . cetirizine (ZYRTEC) 10 MG tablet Take 1 tablet (10 mg total) by mouth daily.  .Marland Kitchenesomeprazole (NEXIUM) 40 MG capsule Take 1 capsule (40 mg total) by mouth daily at 12 noon.  . fluticasone (FLONASE) 50 MCG/ACT nasal spray USE 2 SPRAYS IN EACH NOSTRIL DAILY  . furosemide (LASIX) 40 MG tablet Take 1 tablet (40 mg total) by mouth daily.  .Marland Kitchenglucose blood (ACCU-CHEK AVIVA PLUS) test strip Use as instructed (Patient taking differently: 1 each by Other route 3 (three) times daily. Use as instructed)  . insulin aspart (NOVOLOG FLEXPEN) 100 UNIT/ML FlexPen Inject up to 15 units prior to each meal and as needed for elevated blood glucose  . Insulin Detemir (LEVEMIR FLEXTOUCH) 100 UNIT/ML Pen Inject 35 units twice a day with breakfast and supper.  . Insulin Pen Needle (PEN NEEDLES 3/16") 31G X 5 MM MISC Use with insulin pens qid. Dx:  E11.8 insulin treated type 2 DM  . Insulin Syringe-Needle U-100 (B-D INS SYRINGE 0.5CC/30GX1/2") 30G X 1/2" 0.5 ML MISC Use as directed  . isosorbide mononitrate (IMDUR) 60 MG 24 hr tablet Take 1 tablet (60 mg total) by mouth daily.    .Marland Kitchenlisinopril (PRINIVIL,ZESTRIL) 40 MG tablet Take 1 tablet (40 mg total) by mouth daily.  . metFORMIN (GLUCOPHAGE) 1000 MG tablet Take 1 tablet (1,000 mg total) by mouth 2 (two) times daily with a meal.  . Multiple Vitamins-Minerals (CENTRUM SILVER ADULT 50+ PO) Take 1 tablet by mouth daily.  . nitroGLYCERIN (NITROSTAT) 0.4 MG SL tablet Place 1 tablet (0.4 mg total) under the tongue every 5 (five) minutes as needed.  .Marland Kitchenomega-3 fish oil (MAXEPA) 1000 MG CAPS capsule Take 2 capsules by mouth daily.   . rosuvastatin (CRESTOR) 20 MG tablet Take 1 tablet (20 mg total) by mouth daily.  . [DISCONTINUED] Insulin Detemir (LEVEMIR FLEXTOUCH) 100 UNIT/ML Pen Inject 50 units each night at bedtime (Patient taking differently: Inject 60 Units into the skin daily at 10 pm. Inject 50 units each night at bedtime)  . [DISCONTINUED] HYDROcodone-homatropine (HYCODAN) 5-1.5 MG/5ML syrup Take 5 mLs by mouth every 8 (eight) hours as needed for cough. (Patient not taking: Reported on 05/14/2014)  . [DISCONTINUED] levofloxacin (LEVAQUIN) 500 MG tablet Take 1 tablet (500 mg total) by mouth daily. (Patient not taking: Reported on 05/14/2014)  . [DISCONTINUED] loratadine (CLARITIN) 10 MG tablet Take 1 tablet (10 mg total) by mouth daily. (Patient not taking: Reported on 05/14/2014)  . [DISCONTINUED] predniSONE (DELTASONE) 20 MG tablet Take 1 tablet (20 mg total) by mouth daily with breakfast. (Patient not  taking: Reported on 05/14/2014)    Allergies (verified) Sulfa antibiotics and Penicillins   History: Past Medical History  Diagnosis Date  . CHF (congestive heart failure)   . HTN (hypertension)   . Acid reflux   . Onychomycosis   . Back pain     f4 and f5  . Diverticulosis   . Hyperlipidemia   . Diabetes mellitus 2005   Past Surgical History  Procedure Laterality Date  . Appendectomy    . Colonoscopy N/A 08/01/2013    Procedure: COLONOSCOPY;  Surgeon: Rogene Houston, MD;  Location: [facility];  Service:  Endoscopy;  Laterality: N/A;  rescheduled to [place] notified pt   Family History  Problem Relation Age of Onset  . Hypertension Father   . Diabetes Father   . Cancer Mother   . Alcohol abuse Brother   . Colon cancer Neg Hx    Social History   Occupational History  . Not on file.   Social History Main Topics  . Smoking status: Never Smoker   . Smokeless tobacco: Current User    Types: Snuff     Comment: dips snuff about 6 cans/week for 3 years  . Alcohol Use: 0.6 oz/week    1 Cans of beer per week  . Drug Use: Yes    Special: Marijuana     Comment: last used 07/31/13 per pt-61/11/15  . Sexual Activity: Yes    Do you feel safe at home?  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  Cardiac Risk Factors include: advanced age (>61mn, >>76women);diabetes mellitus;dyslipidemia;family history of premature cardiovascular disease;hypertension;male gender;microalbuminuria;obesity (BMI >30kg/m2);sedentary lifestyle;smoking/ tobacco exposure  Objective:    Today's Vitals   05/14/14 1442  BP: 140/88  Pulse: 78  Height: 5' 8.5" (1.74 m)  Weight: 219 lb (99.338 kg)  PainSc: 4   PainLoc: Foot   Body mass index is 32.81 kg/(m^2).   Activities of Daily Living In your present state of health, do you have any difficulty performing the following activities: 05/14/2014  Is the patient deaf or have difficulty hearing? N  Hearing N  Vision Y  Difficulty concentrating or making decisions N  Walking or climbing stairs? N  Doing errands, shopping? N  Preparing Food and eating ? N    Using the Toilet? N  In the past six months, have you accidently leaked urine? N  Do you have problems with loss of bowel control? N  Managing your Medications? N  Managing your Finances? N  Housekeeping or managing your Housekeeping? N    Are there smokers in your home (other than you)? No    Depression Screen PHQ 2/9 Scores 05/14/2014 12/16/2013  PHQ - 2 Score 1 0    Fall Risk Fall Risk  05/14/2014 12/16/2013  Falls in the past year? No No    Cognitive Function: MMSE - Mini Mental State Exam 05/14/2014  Orientation to time 5  Orientation to Place 5  Registration 3  Attention/ Calculation 1  Recall 2  Language- name 2 objects 2  Language- repeat 1  Language- follow 3 step  command 3  Language- read & follow direction 0  Write a sentence 0  Copy design 1  Total score 23    Immunizations and Health Maintenance Immunization History  Administered Date(s) Administered  . Pneumococcal Conjugate-13 05/14/2014  . Pneumococcal Polysaccharide-23 11/09/2012  . Tdap 07/26/2012   There are no preventive care reminders to display for this patient.  Patient Care Team: Chipper Herb, MD as PCP - General (Family Medicine) Adelina Mings Margret Chance, MD as Referring Physician (Optometry) Herminio Commons, MD as Attending Physician (Cardiology)  Indicate any recent Medical Services you may have received from other than [facility] providers in the past year (date may be approximate).    Assessment:    Annual Wellness Visit  Uncontrolled type 2 DM. Elevated microalbumin HTN   Screening Tests Health Maintenance  Topic Date Due  . INFLUENZA VACCINE  05/22/2014 (Originally 09/21/2013)  . HEMOGLOBIN A1C  07/21/2014  . FOOT EXAM  12/17/2014  . URINE MICROALBUMIN  01/21/2015  . OPHTHALMOLOGY EXAM  02/21/2015  . PNEUMOCOCCAL POLYSACCHARIDE VACCINE (2) 11/09/2017  . TETANUS/TDAP  07/27/2022  . COLONOSCOPY  08/02/2023  . HIV Screening  Completed        Plan:   During the course of the  visit Brad Mendoza was educated and counseled about the following appropriate screening and preventive services:   Vaccines to include Pneumoccal, Influenza, Hepatitis B, Td - patient declined flu vaccine but received Prevnar 13 today  Colorectal cancer screening - UTD  Cardiovascular disease screening - sees cardiologist in 1 month  Increase Levemir to 35 unit bid - will follow up next week and increase as needed.  Continue novolog and metformin at current doses  Discussed complications of uncontrolled DM.  Recommended patient follow up with PCP to have neuropathy evaluated.  Glaucoma screening / Diabetic Eye Exam - UTD, record requested from Dr Gus Puma office  Nutrition counseling - discussed limiting CHO intake and serving sizes.  No regular sodas!  Prostate cancer screening - done today  Tobacco Abuse counseling - patient is not ready to quit at this time.  Change nexium to zantac 164m 1 tablet daily  Orders Placed This Encounter  Procedures  . Pneumococcal conjugate vaccine 13-valent IM  . BMP8+EGFR  . Microalbumin, urine  . PSA, total and free  . POCT glycosylated hemoglobin (Hb A1C)  . POCT glucose (manual entry)     Goals    . Decrease soda or juice intake    . Eat more fruits and vegetables      Increase non-starchy vegetables - carrots, green bean, squash, zucchini, tomatoes, onions, peppers, spinach and other green leafy vegetables, cabbage, lettuce, cucumbers, asparagus, okra (not fried), eggplant limit sugar and processed foods (cakes, cookies, ice cream, crackers and chips) Increase fresh fruit but limit serving sizes 1/2 cup or about the size of tennis or baseball limit red meat to no more than 1-2 times per week (serving size about the size of your palm) Choose whole grains / lean proteins - whole wheat bread, quinoa, whole grain rice (1/2 cup), fish, chicken, [place]    . Exercise 150 minutes per week (moderate activity)        Patient Instructions (the  written plan) were given to the patient.   ECherre Robins [facility]  05/14/2014

## 2014-05-14 NOTE — Patient Instructions (Signed)
Increase Levemir to 35 units 2 times daily   Diabetes and Standards of Medical Care   Diabetes is complicated. You may find that your diabetes team includes a dietitian, nurse, diabetes educator, eye doctor, and more. To help everyone know what is going on and to help you get the care you deserve, the following schedule of care was developed to help keep you on track. Below are the tests, exams, vaccines, medicines, education, and plans you will need.  Blood Glucose Goals Prior to meals = 80 - 130 Within 2 hours of the start of a meal = less than 180  HbA1c test (goal is less than 7.0% - your last value was 13.6%) This test shows how well you have controlled your glucose over the past 2 to 3 months. It is used to see if your diabetes management plan needs to be adjusted.   It is performed at least 2 times a year if you are meeting treatment goals.  It is performed 4 times a year if therapy has changed or if you are not meeting treatment goals.  Blood pressure test  This test is performed at every routine medical visit. The goal is less than 140/90 mmHg for most people, but 130/80 mmHg in some cases. Ask your health care provider about your goal.  Dental exam  Follow up with the dentist regularly.  Eye exam  If you are diagnosed with type 1 diabetes as a child, get an exam upon reaching the age of 79 years or older and have had diabetes for 3 to 5 years. Yearly eye exams are recommended after that initial eye exam.  If you are diagnosed with type 1 diabetes as an adult, get an exam within 5 years of diagnosis and then yearly.  If you are diagnosed with type 2 diabetes, get an exam as soon as possible after the diagnosis and then yearly.  Foot care exam  Visual foot exams are performed at every routine medical visit. The exams check for cuts, injuries, or other problems with the feet.  A comprehensive foot exam should be done yearly. This includes visual inspection as well as  assessing foot pulses and testing for loss of sensation.  Check your feet nightly for cuts, injuries, or other problems with your feet. Tell your health care provider if anything is not healing.  Kidney function test (urine microalbumin)  This test is performed once a year.  Type 1 diabetes: The first test is performed 5 years after diagnosis.  Type 2 diabetes: The first test is performed at the time of diagnosis.  A serum creatinine and estimated glomerular filtration rate (eGFR) test is done once a year to assess the level of chronic kidney disease (CKD), if present.  Lipid profile (cholesterol, HDL, LDL, triglycerides)  Performed every 5 years for most people.  The goal for LDL is less than 100 mg/dL. If you are at high risk, the goal is less than 70 mg/dL.  The goal for HDL is 40 mg/dL to 50 mg/dL for men and 50 mg/dL to 60 mg/dL for women. An HDL cholesterol of 60 mg/dL or higher gives some protection against heart disease.  The goal for triglycerides is less than 150 mg/dL.  Influenza vaccine, pneumococcal vaccine, and hepatitis B vaccine  The influenza vaccine is recommended yearly.  The pneumococcal vaccine is generally given once in a lifetime. However, there are some instances when another vaccination is recommended. Check with your health care provider.  The hepatitis  B vaccine is also recommended for adults with diabetes.  Diabetes self-management education  Education is recommended at diagnosis and ongoing as needed.  Treatment plan  Your treatment plan is reviewed at every medical visit.  Document Released: 12/05/2008 Document Revised: 10/10/2012 Document Reviewed: 07/10/2012 [facility] Patient Information 2014 [place].    Increase non-starchy vegetables - carrots, green bean, squash, zucchini, tomatoes, onions, peppers, spinach and other green leafy vegetables, cabbage, lettuce, cucumbers, asparagus, okra (not fried), eggplant limit sugar and  processed foods (cakes, cookies, ice cream, crackers and chips).  No regular soda. Increase fresh fruit but limit serving sizes 1/2 cup or about the size of tennis or baseball limit red meat to no more than 1-2 times per week (serving size about the size of your palm) Choose whole grains / lean proteins - whole wheat bread, quinoa, whole grain rice (1/2 cup), fish, chicken, [place]

## 2014-05-15 ENCOUNTER — Telehealth: Payer: Self-pay | Admitting: Pharmacist

## 2014-05-15 NOTE — Telephone Encounter (Signed)
Patient notified of lab results from 05/14/14.

## 2014-05-15 NOTE — Telephone Encounter (Signed)
Patient called about labs from yesterday.  Patient report BG has been in 180's today  Which is actually improved.

## 2014-05-19 ENCOUNTER — Other Ambulatory Visit: Payer: Self-pay | Admitting: Nurse Practitioner

## 2014-05-20 ENCOUNTER — Other Ambulatory Visit: Payer: Self-pay | Admitting: Family Medicine

## 2014-06-24 ENCOUNTER — Ambulatory Visit (INDEPENDENT_AMBULATORY_CARE_PROVIDER_SITE_OTHER): Payer: Medicare Other | Admitting: Family Medicine

## 2014-06-24 ENCOUNTER — Encounter: Payer: Self-pay | Admitting: Family Medicine

## 2014-06-24 VITALS — BP 155/91 | HR 77 | Temp 97.0°F | Ht 68.5 in | Wt 212.8 lb

## 2014-06-24 DIAGNOSIS — I10 Essential (primary) hypertension: Secondary | ICD-10-CM

## 2014-06-24 DIAGNOSIS — E785 Hyperlipidemia, unspecified: Secondary | ICD-10-CM

## 2014-06-24 DIAGNOSIS — E1142 Type 2 diabetes mellitus with diabetic polyneuropathy: Secondary | ICD-10-CM

## 2014-06-24 MED ORDER — GABAPENTIN 300 MG PO CAPS: 300.0000 mg | ORAL_CAPSULE | Freq: Three times a day (TID) | ORAL | Status: DC

## 2014-06-24 NOTE — Progress Notes (Signed)
   Subjective:    Patient ID: Brad Mendoza, male    DOB: 1955-10-13, 59 y.o.   MRN: [id number]  HPI  59 year old gentleman here to follow-up diabetes and hypertension and hyperlipidemia. He was seen about 6 weeks ago A1c and sugars were markedly elevated. In talking to him today sounds like compliance may be a problem both with diet and with medicine. I do not think he understands how the medicines work in terms of short acting and long-acting and metformin and we spent some time with education today. He is complaining more today of burning and pain in his legs. He has never been told he may have neuropathy.    Review of Systems  Constitutional: Negative.   HENT: Negative.   Respiratory: Negative.   Cardiovascular: Negative.   Endocrine: Negative.   Neurological: Negative.    Patient Active Problem List   Diagnosis Date Noted  . Type 2 diabetes mellitus with diabetic polyneuropathy 12/16/2013  . CAD (coronary artery disease) 09/18/2013  . Heme positive stool 06/12/2013  . Change in stool 06/12/2013  . Seasonal allergic rhinitis 07/11/2012  . Polysubstance abuse 12/24/2010  . Bronchitis 12/24/2010  . Onychomycosis   . Acid reflux   . Nonischemic cardiomyopathy   . Type 2 diabetes mellitus with complications   . Hyperlipemia 11/16/2009  . Obesity 11/16/2009  . NONDEPENDENT TOBACCO USE DISORDER 11/16/2009  . Essential hypertension 11/16/2009  . CHEST PAIN UNSPECIFIED 11/16/2009   Outpatient Encounter Prescriptions as of 06/24/2014  . Order #: [id number] Class: Normal  . Order #: [id number] Class: Historical Med  . Order #: [id number] Class: Normal  . Order #: [id number] Class: Normal  . Order #: [id number] Class: Normal  . Order #: [id number] Class: Normal  . Order #: [id number] Class: Normal  . Order #: [id number] Class: Normal  . Order #: [id number] Class: Normal  . Order #: [id number] Class: Normal  . Order #: [id number] Class: Normal  . Order #: [id number] Class: Normal  . Order #:  [id number] Class: Normal  . Order #: [id number] Class: Historical Med  . Order #: [id number] Class: Normal  . Order #: [id number] Class: Normal  . Order #: [id number] Class: Historical Med  . Order #: [id number] Class: Normal  . Order #: [id number] Class: Normal       Objective:   Physical Exam  Constitutional: He is oriented to person, place, and time. He appears well-developed.  Cardiovascular: Normal rate, regular rhythm and intact distal pulses.   Pulmonary/Chest: Effort normal.  Musculoskeletal: Normal range of motion.  Neurological: He is alert and oriented to person, place, and time.          Assessment & Plan:  1. Essential hypertension Continue with amlodipine and Lasix. I'm not sure why he is not on an ace her arb but should be if there is no contraindication or allergy  2. Type 2 diabetes mellitus with diabetic polyneuropathy Continue with long and short acting insulin and metformin. Will add gabapentin 300 mg at bedtime for neuropathy  3. Hyperlipemia Reviewing his labs from October. Triglycerides are markedly elevated and probably should be treated

## 2014-06-26 ENCOUNTER — Ambulatory Visit: Payer: Medicare Other | Admitting: Cardiovascular Disease

## 2014-06-29 LAB — BMP8+EGFR
BUN/Creatinine Ratio: 18 (ref 9–20)
BUN: 20 mg/dL (ref 6–24)
CALCIUM: 9.3 mg/dL (ref 8.7–10.2)
CHLORIDE: 101 mmol/L (ref 97–108)
CO2: 19 mmol/L (ref 18–29)
Creatinine, Ser: 1.13 mg/dL (ref 0.76–1.27)
GFR calc Af Amer: 82 mL/min/{1.73_m2} (ref >59)
GFR, EST NON AFRICAN AMERICAN: 71 mL/min/{1.73_m2} (ref >59)
Glucose: 462 mg/dL (ref 65–99)
POTASSIUM: 4.5 mmol/L (ref 3.5–5.2)
SODIUM: 137 mmol/L (ref 134–144)

## 2014-06-29 LAB — PSA, TOTAL AND FREE
PSA FREE PCT: 16.8 %
PSA FREE: 0.42 ng/mL
Prostate Specific Ag, Serum: 2.5 ng/mL (ref 0.0–4.0)

## 2014-07-03 ENCOUNTER — Telehealth: Payer: Self-pay

## 2014-07-03 MED ORDER — INSULIN LISPRO 100 UNIT/ML (KWIKPEN): PEN_INJECTOR | SUBCUTANEOUS | Status: DC

## 2014-07-03 NOTE — Telephone Encounter (Signed)
Insurance wants prior authorization for Novolog Flex Pen  Formulary are Humalog Kwik Pen or Humalog cartridge.   Can he be switched to either of these?  If not I need to answer questions as to why not

## 2014-07-03 NOTE — Telephone Encounter (Signed)
rX changed to humalog kwik pens. Sent to the drug store

## 2014-07-08 ENCOUNTER — Telehealth: Payer: Self-pay

## 2014-07-08 NOTE — Telephone Encounter (Signed)
Insurance denied [employer]  Not on formulary   Tammy changed to [place]

## 2014-07-11 ENCOUNTER — Encounter: Payer: Self-pay | Admitting: Cardiovascular Disease

## 2014-07-11 ENCOUNTER — Ambulatory Visit (INDEPENDENT_AMBULATORY_CARE_PROVIDER_SITE_OTHER): Payer: Medicare Other | Admitting: Cardiovascular Disease

## 2014-07-11 VITALS — BP 130/82 | HR 83 | Ht 69.0 in | Wt 211.0 lb

## 2014-07-11 DIAGNOSIS — I10 Essential (primary) hypertension: Secondary | ICD-10-CM

## 2014-07-11 DIAGNOSIS — I429 Cardiomyopathy, unspecified: Secondary | ICD-10-CM | POA: Diagnosis not present

## 2014-07-11 DIAGNOSIS — I209 Angina pectoris, unspecified: Secondary | ICD-10-CM

## 2014-07-11 DIAGNOSIS — Z716 Tobacco abuse counseling: Secondary | ICD-10-CM

## 2014-07-11 DIAGNOSIS — I25119 Atherosclerotic heart disease of native coronary artery with unspecified angina pectoris: Secondary | ICD-10-CM | POA: Diagnosis not present

## 2014-07-11 DIAGNOSIS — I5022 Chronic systolic (congestive) heart failure: Secondary | ICD-10-CM

## 2014-07-11 DIAGNOSIS — E785 Hyperlipidemia, unspecified: Secondary | ICD-10-CM | POA: Diagnosis not present

## 2014-07-11 DIAGNOSIS — R5383 Other fatigue: Secondary | ICD-10-CM

## 2014-07-11 DIAGNOSIS — F1010 Alcohol abuse, uncomplicated: Secondary | ICD-10-CM

## 2014-07-11 MED ORDER — ISOSORBIDE MONONITRATE ER 60 MG PO TB24: 90.0000 mg | ORAL_TABLET | Freq: Every day | ORAL | Status: DC

## 2014-07-11 NOTE — Progress Notes (Signed)
Patient ID: RABURN WIGAL, male   DOB: September 11, 1955, 59 y.o.   MRN: [id number]      SUBJECTIVE: The patient is here for routine follow-up for chronic systolic heart failure, coronary artery disease, hypertension and hyperlipidemia. He has a history of a nonischemic cardiomyopathy. Echocardiogram in October 2014 demonstrated mildly reduced left ventricular systolic function, EF [id number], with grade 1 diastolic dysfunction, moderate left atrial and mild right atrial enlargement. Nuclear stress testing in October 2014 demonstrated anterior wall and anteroseptal infarcts with no suggestion of ischemia. Shortness of breath and leg swelling.  He has been experiencing exertional chest tightness while doing yard work. He has not taken any sublingual nitroglycerin. He also developed some chest tightness after drinking beer and does admit to heartburn. He had been drinking up to 2 quarts of beer daily but has cut back in the past one to two weeks. He has felt fatigued for the past 2 months. He takes 2 teaspoons of brown vinegar every morning and if he does not do this, he said his blood pressure is elevated. He denies SOB and leg swelling.  ECG performed in the office today demonstrates normal sinus rhythm with a nonspecific T wave abnormality and an isolated PVC.  Review of Systems: As per "subjective", otherwise negative.  Allergies  Allergen Reactions  . Sulfa Antibiotics Shortness Of Breath  . Penicillins Anxiety    REACTION: rash    Current Outpatient Prescriptions  Medication Sig Dispense Refill  . amLODipine (NORVASC) 5 MG tablet Take 1 tablet (5 mg total) by mouth daily. 90 tablet 3  . aspirin EC 81 MG tablet Take 81 mg by mouth daily.    . carvedilol (COREG) 25 MG tablet Take 1 tablet (25 mg total) by mouth 2 (two) times daily. 180 tablet 3  . cetirizine (ZYRTEC) 10 MG tablet Take 1 tablet (10 mg total) by mouth daily. 30 tablet 5  . fluticasone (FLONASE) 50 MCG/ACT nasal spray USE 2 SPRAYS IN  EACH NOSTRIL DAILY 16 g 5  . furosemide (LASIX) 40 MG tablet Take 1 tablet (40 mg total) by mouth daily. 30 tablet 3  . gabapentin (NEURONTIN) 300 MG capsule Take 1 capsule (300 mg total) by mouth 3 (three) times daily. 90 capsule 0  . glucose blood (ACCU-CHEK AVIVA PLUS) test strip Use as instructed (Patient taking differently: 1 each by Other route 3 (three) times daily. Use as instructed) 100 each 0  . Insulin Detemir (LEVEMIR FLEXTOUCH) 100 UNIT/ML Pen Inject 35 units twice a day with breakfast and supper. 30 mL 1  . insulin lispro (HUMALOG KWIKPEN) 100 UNIT/ML KiwkPen Inject up to 15 units prior to each meal and as needed for elevated BG 15 mL 2  . Insulin Pen Needle (PEN NEEDLES 3/16") 31G X 5 MM MISC Use with insulin pens qid. Dx:  E11.8 insulin treated type 2 DM 100 each 2  . Insulin Syringe-Needle U-100 (B-D INS SYRINGE 0.5CC/30GX1/2") 30G X 1/2" 0.5 ML MISC Use as directed 100 each 0  . isosorbide mononitrate (IMDUR) 60 MG 24 hr tablet Take 1 tablet (60 mg total) by mouth daily. 30 tablet 6  . lisinopril (PRINIVIL,ZESTRIL) 40 MG tablet Take 1 tablet (40 mg total) by mouth daily. 30 tablet 5  . metFORMIN (GLUCOPHAGE) 1000 MG tablet TAKE ONE TABLET TWICE A DAY WITH FOOD 60 tablet 2  . Multiple Vitamins-Minerals (CENTRUM SILVER ADULT 50+ PO) Take 1 tablet by mouth daily.    . nitroGLYCERIN (NITROSTAT) 0.4 MG SL tablet  Place 1 tablet (0.4 mg total) under the tongue every 5 (five) minutes as needed. 30 tablet 1  . omega-3 fish oil (MAXEPA) 1000 MG CAPS capsule Take 2 capsules by mouth daily.  90 each   . ranitidine (ZANTAC) 150 MG tablet Take 1 tablet (150 mg total) by mouth 2 (two) times daily. 90 tablet 1  . rosuvastatin (CRESTOR) 20 MG tablet Take 1 tablet (20 mg total) by mouth daily. 90 tablet 3   No current facility-administered medications for this visit.    Past Medical History  Diagnosis Date  . CHF (congestive heart failure)   . HTN (hypertension)   . Acid reflux   .  Onychomycosis   . Back pain     f4 and f5  . Diverticulosis   . Hyperlipidemia   . Diabetes mellitus 2005    Past Surgical History  Procedure Laterality Date  . Appendectomy    . Colonoscopy N/A 08/01/2013    Procedure: COLONOSCOPY;  Surgeon: Rogene Houston, MD;  Location: [facility];  Service: Endoscopy;  Laterality: N/A;  rescheduled to [place] notified pt    History   Social History  . Marital Status: Married    Spouse Name: N/A  . Number of Children: N/A  . Years of Education: N/A   Occupational History  . Not on file.   Social History Main Topics  . Smoking status: Never Smoker   . Smokeless tobacco: Current User    Types: Snuff     Comment: dips snuff about 6 cans/week for 3 years  . Alcohol Use: 0.6 oz/week    1 Cans of beer per week  . Drug Use: Yes    Special: Marijuana     Comment: last used 07/31/13 per pt-61/11/15  . Sexual Activity: Yes   Other Topics Concern  . Not on file   Social History Narrative     Filed Vitals:   07/11/14 0853  BP: 130/82  Pulse: 83  Height: 5\' 9"  (1.753 m)  Weight: 211 lb (95.709 kg)  SpO2: 98%    PHYSICAL EXAM General: NAD HEENT: Normal. Neck: No JVD, no thyromegaly. Lungs: Clear to auscultation bilaterally with normal respiratory effort. CV: Nondisplaced PMI.  Regular rate and rhythm, normal S1/S2, no S3/S4, no murmur. No pretibial or periankle edema.  No carotid bruit.  Normal pedal pulses.  Abdomen: Soft, nontender, no hepatosplenomegaly, no distention.  Neurologic: Alert and oriented x 3.  Psych: Normal affect. Skin: Normal. Musculoskeletal: Normal range of motion, no gross deformities. Extremities: No clubbing or cyanosis.   ECG: Most recent ECG reviewed.      ASSESSMENT AND PLAN: 1. Chest pain with CAD (50% prior LAD lesion):  Increase Imdur to 90 mg daily. Previous stress test showed only anterior and anteroseptal wall infarct without ischemia. Continue ASA and Crestor along with Imdur. Tobacco  and alcohol cessation emphasized. Obtain echocardiogram.  2. Essential HTN: Controlled on present therapy. No changes.  3. Hyperlipidemia: Elevated total cholesterol and triglycerides on 12/16/13. Continue Crestor 20 mg daily.   4. Fatigue in context of CAD and chronic systolic heart failure: Well compensated on current medical therapy. EF improved to 40-45% in 11/2012. Continue Lasix 40 mg daily along with Coreg and lisinopril. Will obtain echocardiogram.  5. Tobacco and alcohol abuse: Cessation counseling provided.  Dispo: f/u 4 months.  Kate Sable, M.D., F.A.C.C.

## 2014-07-11 NOTE — Patient Instructions (Signed)
   Increase Imdur to 90mg  daily - take 1 1/2 tabs of the 60mg  tablet - new sent to pharmacy today. Continue all other medications.   Your physician has requested that you have an echocardiogram. Echocardiography is a painless test that uses sound waves to create images of your heart. It provides your doctor with information about the size and shape of your heart and how well your heart's chambers and valves are working. This procedure takes approximately one hour. There are no restrictions for this procedure. Office will contact with results via phone or letter.   Follow up in  4 months

## 2014-07-17 ENCOUNTER — Ambulatory Visit (INDEPENDENT_AMBULATORY_CARE_PROVIDER_SITE_OTHER): Payer: Medicare Other

## 2014-07-17 ENCOUNTER — Other Ambulatory Visit: Payer: Self-pay

## 2014-07-17 DIAGNOSIS — R5383 Other fatigue: Secondary | ICD-10-CM | POA: Diagnosis not present

## 2014-07-17 DIAGNOSIS — I209 Angina pectoris, unspecified: Secondary | ICD-10-CM | POA: Diagnosis not present

## 2014-07-23 ENCOUNTER — Telehealth: Payer: Self-pay | Admitting: *Deleted

## 2014-07-23 NOTE — Telephone Encounter (Signed)
Notes Recorded by Laurine Blazer, [name] on [id number] at [id number] AM Patient notified.

## 2014-07-23 NOTE — Telephone Encounter (Signed)
-----   Message from [place] sent at 07/17/2014  1:02 PM EDT -----   ----- Message -----    From: Herminio Commons, MD    Sent: 07/17/2014  12:21 PM      To: Staci T Ashworth, CMA  Pumping function unchanged from October 2014. Continue present therapy.

## 2014-09-10 ENCOUNTER — Encounter: Payer: Self-pay | Admitting: *Deleted

## 2014-09-24 ENCOUNTER — Encounter (INDEPENDENT_AMBULATORY_CARE_PROVIDER_SITE_OTHER): Payer: Self-pay | Admitting: *Deleted

## 2014-09-25 ENCOUNTER — Other Ambulatory Visit: Payer: Self-pay | Admitting: Pharmacist

## 2014-10-06 ENCOUNTER — Ambulatory Visit: Payer: Medicare Other | Admitting: Family Medicine

## 2014-10-07 ENCOUNTER — Encounter: Payer: Self-pay | Admitting: Family Medicine

## 2014-10-14 ENCOUNTER — Encounter: Payer: Self-pay | Admitting: Cardiology

## 2014-10-23 ENCOUNTER — Encounter: Payer: Self-pay | Admitting: Cardiovascular Disease

## 2014-10-23 ENCOUNTER — Encounter: Payer: Self-pay | Admitting: Family Medicine

## 2014-10-23 ENCOUNTER — Ambulatory Visit (INDEPENDENT_AMBULATORY_CARE_PROVIDER_SITE_OTHER): Payer: Medicare Other | Admitting: Cardiovascular Disease

## 2014-10-23 ENCOUNTER — Ambulatory Visit (INDEPENDENT_AMBULATORY_CARE_PROVIDER_SITE_OTHER): Payer: Medicare Other | Admitting: Family Medicine

## 2014-10-23 VITALS — BP 136/80 | HR 82 | Ht 69.0 in | Wt 210.4 lb

## 2014-10-23 VITALS — BP 147/84 | HR 90 | Temp 97.9°F | Ht 69.0 in | Wt 210.2 lb

## 2014-10-23 DIAGNOSIS — I209 Angina pectoris, unspecified: Secondary | ICD-10-CM

## 2014-10-23 DIAGNOSIS — I10 Essential (primary) hypertension: Secondary | ICD-10-CM | POA: Diagnosis not present

## 2014-10-23 DIAGNOSIS — I429 Cardiomyopathy, unspecified: Secondary | ICD-10-CM | POA: Diagnosis not present

## 2014-10-23 DIAGNOSIS — E118 Type 2 diabetes mellitus with unspecified complications: Secondary | ICD-10-CM

## 2014-10-23 DIAGNOSIS — E785 Hyperlipidemia, unspecified: Secondary | ICD-10-CM

## 2014-10-23 DIAGNOSIS — I2510 Atherosclerotic heart disease of native coronary artery without angina pectoris: Secondary | ICD-10-CM

## 2014-10-23 DIAGNOSIS — I5022 Chronic systolic (congestive) heart failure: Secondary | ICD-10-CM

## 2014-10-23 LAB — POCT UA - MICROALBUMIN: MICROALBUMIN (UR) POC: 100 mg/L

## 2014-10-23 LAB — GLUCOSE, POCT (MANUAL RESULT ENTRY): POC Glucose: 353 mg/dl — AB (ref 70–99)

## 2014-10-23 LAB — POCT GLYCOSYLATED HEMOGLOBIN (HGB A1C)

## 2014-10-23 NOTE — Patient Instructions (Signed)

## 2014-10-23 NOTE — Progress Notes (Signed)
Subjective:    Patient ID: Brad Mendoza, male    DOB: January 31, 1956, 59 y.o.   MRN: [id number]  HPI 59 year old gentleman here to follow-up with diabetes, congestive heart failure, coronary artery disease, and diabetic neuropathy. Regarding neuropathy tried to take gabapentin after his last visit but it made him dizzy. Symptoms persist. He did see his cardiologist this morning seems like things are stable. He does take nitroglycerin on a daily basis which precludes use of sildenafil which he had requested today.  He continues to have acid reflux and uses ranitidine.  Patient Active Problem List   Diagnosis Date Noted  . Type 2 diabetes mellitus with diabetic polyneuropathy 12/16/2013  . CAD (coronary artery disease) 09/18/2013  . Heme positive stool 06/12/2013  . Change in stool 06/12/2013  . Seasonal allergic rhinitis 07/11/2012  . Polysubstance abuse 12/24/2010  . Bronchitis 12/24/2010  . Onychomycosis   . Acid reflux   . Nonischemic cardiomyopathy   . Type 2 diabetes mellitus with complications   . Hyperlipemia 11/16/2009  . Obesity 11/16/2009  . NONDEPENDENT TOBACCO USE DISORDER 11/16/2009  . Essential hypertension 11/16/2009  . CHEST PAIN UNSPECIFIED 11/16/2009   Outpatient Encounter Prescriptions as of 10/23/2014  Medication Sig  . amLODipine (NORVASC) 5 MG tablet Take 1 tablet (5 mg total) by mouth daily.  [name] aspirin EC 81 MG tablet Take 81 mg by mouth daily.  . carvedilol (COREG) 25 MG tablet Take 1 tablet (25 mg total) by mouth 2 (two) times daily.  . cetirizine (ZYRTEC) 10 MG tablet Take 1 tablet (10 mg total) by mouth daily.  . fluticasone (FLONASE) 50 MCG/ACT nasal spray USE 2 SPRAYS IN EACH NOSTRIL DAILY  . furosemide (LASIX) 40 MG tablet Take 1 tablet (40 mg total) by mouth daily.  [name] gabapentin (NEURONTIN) 300 MG capsule Take 1 capsule (300 mg total) by mouth 3 (three) times daily.  [name] glucose blood (ACCU-CHEK AVIVA PLUS) test strip Use as instructed (Patient taking  differently: 1 each by Other route 3 (three) times daily. Use as instructed)  . Insulin Detemir (LEVEMIR FLEXTOUCH) 100 UNIT/ML Pen Inject 35 units twice a day with breakfast and supper.  . insulin lispro (HUMALOG KWIKPEN) 100 UNIT/ML KiwkPen Inject up to 15 units prior to each meal and as needed for elevated BG  . Insulin Syringe-Needle U-100 (B-D INS SYRINGE 0.5CC/30GX1/2") 30G X 1/2" 0.5 ML MISC Use as directed  . isosorbide mononitrate (IMDUR) 60 MG 24 hr tablet Take 1.5 tablets (90 mg total) by mouth daily.  [name] lisinopril (PRINIVIL,ZESTRIL) 40 MG tablet Take 1 tablet (40 mg total) by mouth daily.  . metFORMIN (GLUCOPHAGE) 1000 MG tablet TAKE ONE TABLET TWICE A DAY WITH FOOD  . Multiple Vitamins-Minerals (CENTRUM SILVER ADULT 50+ PO) Take 1 tablet by mouth daily.  . nitroGLYCERIN (NITROSTAT) 0.4 MG SL tablet Place 1 tablet (0.4 mg total) under the tongue every 5 (five) minutes as needed.  [name] omega-3 fish oil (MAXEPA) 1000 MG CAPS capsule Take 2 capsules by mouth daily.   . ranitidine (ZANTAC) 150 MG tablet Take 1 tablet (150 mg total) by mouth 2 (two) times daily.  . rosuvastatin (CRESTOR) 20 MG tablet Take 1 tablet (20 mg total) by mouth daily.  [name] UNIFINE PENTIPS 32G X 4 MM MISC 4 TIMES DAILY   No facility-administered encounter medications on file as of 10/23/2014.      Review of Systems  Constitutional: Negative.   Respiratory: Negative.   Cardiovascular: Negative.   Musculoskeletal: Negative.  Neurological: Negative.   Psychiatric/Behavioral: Negative.        Objective:   Physical Exam  Constitutional: He is oriented to person, place, and time. He appears well-developed and well-nourished.  Cardiovascular: Normal rate, regular rhythm and intact distal pulses.   Pulmonary/Chest: Effort normal and breath sounds normal.  Neurological: He is alert and oriented to person, place, and time.          Assessment & Plan:  1. Type 2 diabetes mellitus with complications Patient had  stopped metformin on his own and continues with insulin. I'll be surprised if A1c has improved - POCT glycosylated hemoglobin (Hb A1C) - POCT UA - Microalbumin - Microalbumin, urine - POCT glucose (manual entry)  2. Essential hypertension Blood pressure is acceptable on combination amlodipine carvedilol - CMP14+EGFR  3. Hyperlipemia Lipids were last checked year ago triglycerides are markedly elevated at 658 - Lipid panel  [name] MD

## 2014-10-23 NOTE — Progress Notes (Signed)
Patient ID: Brad Mendoza, male   DOB: Mar 07, 1955, 59 y.o.   MRN: [id number]      SUBJECTIVE: The patient is here for routine follow-up for chronic systolic heart failure, coronary artery disease, hypertension and hyperlipidemia. He has a history of a nonischemic cardiomyopathy. In 2005, a 50% LAD lesion was noted. Nuclear stress testing in October 2014 demonstrated anterior wall and anteroseptal infarcts with no suggestion of ischemia.  Echocardiogram on 07/17/14 demonstrated mild to moderately reduced left ventricular systolic function, EF A999333, with grade 1 diastolic dysfunction, mild left atrial enlargement. Nuclear stress testing in October 2014 demonstrated anterior wall and anteroseptal infarcts with no suggestion of ischemia. Shortness of breath and leg swelling.  The patient denies any symptoms of chest pain, palpitations, shortness of breath, lightheadedness, dizziness, leg swelling, orthopnea, PND, and syncope.  Primary complaints relate to sinus pain and tenderness for which he is going to see his PCP later today.   Review of Systems: As per "subjective", otherwise negative.  Allergies  Allergen Reactions  . Sulfa Antibiotics Shortness Of Breath  . Penicillins Anxiety    REACTION: rash    Current Outpatient Prescriptions  Medication Sig Dispense Refill  . amLODipine (NORVASC) 5 MG tablet Take 1 tablet (5 mg total) by mouth daily. 90 tablet 3  . aspirin EC 81 MG tablet Take 81 mg by mouth daily.    . carvedilol (COREG) 25 MG tablet Take 1 tablet (25 mg total) by mouth 2 (two) times daily. 180 tablet 3  . cetirizine (ZYRTEC) 10 MG tablet Take 1 tablet (10 mg total) by mouth daily. 30 tablet 5  . fluticasone (FLONASE) 50 MCG/ACT nasal spray USE 2 SPRAYS IN EACH NOSTRIL DAILY 16 g 5  . furosemide (LASIX) 40 MG tablet Take 1 tablet (40 mg total) by mouth daily. 30 tablet 3  . gabapentin (NEURONTIN) 300 MG capsule Take 1 capsule (300 mg total) by mouth 3 (three) times daily. 90  capsule 0  . glucose blood (ACCU-CHEK AVIVA PLUS) test strip Use as instructed (Patient taking differently: 1 each by Other route 3 (three) times daily. Use as instructed) 100 each 0  . Insulin Detemir (LEVEMIR FLEXTOUCH) 100 UNIT/ML Pen Inject 35 units twice a day with breakfast and supper. 30 mL 1  . insulin lispro (HUMALOG KWIKPEN) 100 UNIT/ML KiwkPen Inject up to 15 units prior to each meal and as needed for elevated BG 15 mL 2  . Insulin Syringe-Needle U-100 (B-D INS SYRINGE 0.5CC/30GX1/2") 30G X 1/2" 0.5 ML MISC Use as directed 100 each 0  . isosorbide mononitrate (IMDUR) 60 MG 24 hr tablet Take 1.5 tablets (90 mg total) by mouth daily. 45 tablet 6  . lisinopril (PRINIVIL,ZESTRIL) 40 MG tablet Take 1 tablet (40 mg total) by mouth daily. 30 tablet 5  . metFORMIN (GLUCOPHAGE) 1000 MG tablet TAKE ONE TABLET TWICE A DAY WITH FOOD 60 tablet 2  . Multiple Vitamins-Minerals (CENTRUM SILVER ADULT 50+ PO) Take 1 tablet by mouth daily.    . nitroGLYCERIN (NITROSTAT) 0.4 MG SL tablet Place 1 tablet (0.4 mg total) under the tongue every 5 (five) minutes as needed. 30 tablet 1  . omega-3 fish oil (MAXEPA) 1000 MG CAPS capsule Take 2 capsules by mouth daily.  90 each   . ranitidine (ZANTAC) 150 MG tablet Take 1 tablet (150 mg total) by mouth 2 (two) times daily. 90 tablet 1  . rosuvastatin (CRESTOR) 20 MG tablet Take 1 tablet (20 mg total) by mouth daily. 90 tablet  3  . UNIFINE PENTIPS 32G X 4 MM MISC 4 TIMES DAILY 100 each 1   No current facility-administered medications for this visit.    Past Medical History  Diagnosis Date  . CHF (congestive heart failure)   . HTN (hypertension)   . Acid reflux   . Onychomycosis   . Back pain     f4 and f5  . Diverticulosis   . Hyperlipidemia   . Diabetes mellitus 2005  . H/O chest pain 2005    ECHO    Past Surgical History  Procedure Laterality Date  . Appendectomy    . Colonoscopy N/A 08/01/2013    Procedure: COLONOSCOPY;  Surgeon: Rogene Houston,  MD;  Location: [facility];  Service: Endoscopy;  Laterality: N/A;  rescheduled to [place] notified pt  . Cardiac catheterization  2005    4 frech cath    Social History   Social History  . Marital Status: Married    Spouse Name: N/A  . Number of Children: N/A  . Years of Education: N/A   Occupational History  . Not on file.   Social History Main Topics  . Smoking status: Never Smoker   . Smokeless tobacco: Current User    Types: Snuff     Comment: dips snuff about 6 cans/week for 3 years  . Alcohol Use: 0.6 oz/week    1 Cans of beer per week  . Drug Use: Yes    Special: Marijuana     Comment: last used 07/31/13 per pt-61/11/15  . Sexual Activity: Yes   Other Topics Concern  . Not on file   Social History Narrative     Filed Vitals:   10/23/14 0758  BP: 136/80  Pulse: 82  Height: 5\' 9"  (1.753 m)  Weight: 210 lb 6.4 oz (95.437 kg)  SpO2: 98%    PHYSICAL EXAM General: NAD HEENT: Normal. Neck: No JVD, no thyromegaly. Lungs: Clear to auscultation bilaterally with normal respiratory effort. CV: Nondisplaced PMI.  Regular rate and rhythm, normal S1/S2, no S3/S4, no murmur. No pretibial or periankle edema.  No carotid bruit.  Normal pedal pulses.  Abdomen: Soft, nontender, no hepatosplenomegaly, no distention.  Neurologic: Alert and oriented x 3.  Psych: Normal affect. Skin: Normal. Musculoskeletal: Normal range of motion, no gross deformities. Extremities: No clubbing or cyanosis.   ECG: Most recent ECG reviewed.      ASSESSMENT AND PLAN: 1. CAD (50% prior LAD lesion):Symptomatically stable on Imdur 90 mg daily. Previous stress test showed only anterior and anteroseptal wall infarct without ischemia. Continue ASA and Crestor.   2. Essential HTN: Controlled on present therapy. No changes.  3. Hyperlipidemia: Elevated total cholesterol and triglycerides on 12/16/13. Continue Crestor 20 mg daily.   4. Chronic systolic heart failure: Well compensated on  current medical therapy. EF improved to 40-45% as noted above. Continue Lasix 40 mg daily along with Coreg and lisinopril.  5. Tobacco and alcohol abuse: Cessation counseling previously provided.  Dispo: f/u 6 months.   Kate Sable, M.D., F.A.C.C.

## 2014-10-23 NOTE — Addendum Note (Signed)
Addended by: Earlene Plater on: 10/23/2014 12:21 PM   Modules accepted: Miquel Dunn

## 2014-10-24 LAB — CMP14+EGFR
ALT: 20 IU/L (ref 0–44)
AST: 17 IU/L (ref 0–40)
Albumin/Globulin Ratio: 1.8 (ref 1.1–2.5)
Albumin: 3.5 g/dL (ref 3.5–5.5)
Alkaline Phosphatase: 84 IU/L (ref 39–117)
BUN/Creatinine Ratio: 20 (ref 9–20)
BUN: 20 mg/dL (ref 6–24)
Bilirubin Total: <0.2 mg/dL (ref 0.0–1.2)
CALCIUM: 8.9 mg/dL (ref 8.7–10.2)
CO2: 21 mmol/L (ref 18–29)
CREATININE: 1.02 mg/dL (ref 0.76–1.27)
Chloride: 97 mmol/L (ref 97–108)
GFR calc Af Amer: 93 mL/min/{1.73_m2} (ref >59)
GFR, EST NON AFRICAN AMERICAN: 81 mL/min/{1.73_m2} (ref >59)
Globulin, Total: 2 g/dL (ref 1.5–4.5)
Glucose: 477 mg/dL (ref 65–99)
Potassium: 4.3 mmol/L (ref 3.5–5.2)
Sodium: 135 mmol/L (ref 134–144)
Total Protein: 5.5 g/dL — ABNORMAL LOW (ref 6.0–8.5)

## 2014-10-24 LAB — LIPID PANEL
CHOL/HDL RATIO: 6.2 ratio — AB (ref 0.0–5.0)
Cholesterol, Total: 287 mg/dL — ABNORMAL HIGH (ref 100–199)
HDL: 46 mg/dL (ref >39)
LDL CALC: 166 mg/dL — AB (ref 0–99)
TRIGLYCERIDES: 376 mg/dL — AB (ref 0–149)
VLDL Cholesterol Cal: 75 mg/dL — ABNORMAL HIGH (ref 5–40)

## 2014-10-24 LAB — MICROALBUMIN, URINE: MICROALBUM., U, RANDOM: 982.5 ug/mL

## 2014-11-07 ENCOUNTER — Ambulatory Visit (INDEPENDENT_AMBULATORY_CARE_PROVIDER_SITE_OTHER): Payer: Medicare Other | Admitting: Pharmacist

## 2014-11-07 ENCOUNTER — Encounter: Payer: Self-pay | Admitting: Pharmacist

## 2014-11-07 VITALS — BP 120/78 | HR 84 | Ht 69.0 in | Wt 208.0 lb

## 2014-11-07 DIAGNOSIS — R7309 Other abnormal glucose: Secondary | ICD-10-CM

## 2014-11-07 DIAGNOSIS — I209 Angina pectoris, unspecified: Secondary | ICD-10-CM | POA: Diagnosis not present

## 2014-11-07 DIAGNOSIS — I25119 Atherosclerotic heart disease of native coronary artery with unspecified angina pectoris: Secondary | ICD-10-CM | POA: Diagnosis not present

## 2014-11-07 DIAGNOSIS — E1142 Type 2 diabetes mellitus with diabetic polyneuropathy: Secondary | ICD-10-CM | POA: Diagnosis not present

## 2014-11-07 DIAGNOSIS — I10 Essential (primary) hypertension: Secondary | ICD-10-CM | POA: Diagnosis not present

## 2014-11-07 LAB — POCT URINALYSIS DIPSTICK
BILIRUBIN UA: NEGATIVE
GLUCOSE UA: NEGATIVE
Ketones, UA: NEGATIVE
Leukocytes, UA: NEGATIVE
NITRITE UA: NEGATIVE
Spec Grav, UA: 1.01
UROBILINOGEN UA: NEGATIVE
pH, UA: 5

## 2014-11-07 MED ORDER — INSULIN LISPRO 100 UNIT/ML ~~LOC~~ SOLN
15.0000 [IU] | Freq: Once | SUBCUTANEOUS | Status: AC
  Administered 2014-11-07: 15 [IU] via SUBCUTANEOUS

## 2014-11-07 MED ORDER — METFORMIN HCL ER 500 MG PO TB24: 500.0000 mg | ORAL_TABLET | Freq: Every day | ORAL | Status: DC

## 2014-11-07 MED ORDER — INSULIN DETEMIR 100 UNIT/ML FLEXPEN: PEN_INJECTOR | SUBCUTANEOUS | Status: DC

## 2014-11-07 NOTE — Progress Notes (Signed)
Diabetes Visit  Chief Complaint:   Chief Complaint  Patient presents with  . Diabetes     Filed Vitals:   11/07/14 1134  BP: 120/78  Pulse: 84   Filed Weights   11/07/14 1134  Weight: 208 lb (94.348 kg)   Body mass index is 30.7 kg/(m^2).  HPI: patient with uncontrolled DM diagnosed around 2007.   Last visit with me was March 2016.  Patient's most recent A1c was greater 14%.  He is confused about his insulin regimen. He states that his levemir and Novolog or "orange one" was "taken away" and that he is only using Humalog insulin.  He too 35 units of the Humalog but this cause hypoglycemia (BG of 45).  He has been taking 15 units of Humalog 1 to 3 times a day.   He is also suppose to take metfromin 1000mg  bid but he is only taking 1/2 tablet every 2 to 3 days due to c/o of diarrhea.   Did not bring in glucometer but reports HBG ranging from 64 to 500.  RBG in office today was 525.  Checked urine and was negative for ketones so we adminstered 15 units of Humalon insulin .   BG came down to 425.    Low fat/carbohydrate diet?  No - trying to decrease CHO and sugar in take but patient admits to moments of indiscretion Nicotine Abuse?  No Medication Compliance?  No  Exercise?  No Alcohol use?  Yes - 1 to 2 beer per day  Exam Edema:  negative  Polyuria:  Positive at night  Polydipsia:  negative Polyphagia:  negataive  Weight changes:  Decreased 4# over last 2 weeks General Appearance:  alert, oriented, no acute distress and obese Mood/Affect:  normal   Lab Results  Component Value Date   HGBA1C >14.0 10/23/2014     Assessment: Type 2 diabetes requiring insulin - inadequately controlled Obesity - weight decreased but could be related to poorly controlled DM HTN - controlled based on today's BP Poor medication adherence Hyperlipidemia - elevated LDL and triglycerides - related to non compliance.   Recommendations: 1.  Education provided about long vs short acting  insulins.  Spent about 5 minutes discussing differences and when to use.   2.  Medication recommendations at this time are as follows:    Restart Levemir - will start with 10 units.  Patient to check BG each morning and increase by 1 unit of levmir daily until FBG is 150 or lower.   Start metformin XR 500mg  take 1 tablet daily.  Will try to increase in future as patient is able to tolerate.   Humalog / Novolog Insulin dose - sliding scale. Check Blood glucose prior to each meal and given insulin according to below  Less than 80 - no insulin  80 to 120 - 8 units  121- 200 - 9 units  201 - 250 - 10 units  250 - 300 - 11 units  301 - 350 - 12 units  351 or above - 14 units     3. Reviewed HBG goals:  Fasting 80-130 and 1-2 hour post prandial <180.  Patient is instructed to check BG 4 times per day.  Reminded patient that I am here to assist him in better BG control.  He can call and we can adjust insulin over phone if BG is not improving or if he has low BG less than 70 or high BG over 300 more than 2 times per  week.  It is also important to keep follow up appointments.    4.  Dietary recommendations:  Reviewed CHO counting  - gave picture depiction of serving sizes and high CHO foods 5.  Physical Activity recommendations:  Continue gardening and be active 6.  Return to clinic in 2 weeks   Time spent counseling patient:  60 minutes   Cherre Robins, PharmD, CPP, CDE

## 2014-11-07 NOTE — Patient Instructions (Signed)
Levemir Insulin - long acting insulin - important to keep blood glucose down between meals Levemir Insulin - restart taking 10 units once a day.  Checked blood glucose every morning.  If blood glucose if over 150 - increase by 1 units of insulin.     Humalog / Novolog = short acting insulin - decreases blood glucose fast.  Humalog / Novolog Insulin dose - sliding scale. Check Blood glucose prior to each meal and given insulin according to below  Less than 80 - no insulin  80 to 120 - 8 units  121- 200 - 9 units  201 - 250 - 10 units  250 - 300 - 11 units  301 - 350 - 12 units  351 or above - 14 units   Diabetes and Standards of Medical Care   Diabetes is complicated. You may find that your diabetes team includes a dietitian, nurse, diabetes educator, eye doctor, and more. To help everyone know what is going on and to help you get the care you deserve, the following schedule of care was developed to help keep you on track. Below are the tests, exams, vaccines, medicines, education, and plans you will need.  Blood Glucose Goals Prior to meals = 80 - 130 Within 2 hours of the start of a meal = less than 180  HbA1c test (goal is less than 7.0% - your last value was over 14%) This test shows how well you have controlled your glucose over the past 2 to 3 months. It is used to see if your diabetes management plan needs to be adjusted.   It is performed at least 2 times a year if you are meeting treatment goals.  It is performed 4 times a year if therapy has changed or if you are not meeting treatment goals.  Blood pressure test  This test is performed at every routine medical visit. The goal is less than 140/90 mmHg for most people, but 130/80 mmHg in some cases. Ask your health care provider about your goal.  Dental exam  Follow up with the dentist regularly.  Eye exam  If you are diagnosed with type 1 diabetes as a child, get an exam upon reaching the age of 66  years or older and have had diabetes for 3 to 5 years. Yearly eye exams are recommended after that initial eye exam.  If you are diagnosed with type 1 diabetes as an adult, get an exam within 5 years of diagnosis and then yearly.  If you are diagnosed with type 2 diabetes, get an exam as soon as possible after the diagnosis and then yearly.  Foot care exam  Visual foot exams are performed at every routine medical visit. The exams check for cuts, injuries, or other problems with the feet.  A comprehensive foot exam should be done yearly. This includes visual inspection as well as assessing foot pulses and testing for loss of sensation.  Check your feet nightly for cuts, injuries, or other problems with your feet. Tell your health care provider if anything is not healing.  Kidney function test (urine microalbumin)  This test is performed once a year.  Type 1 diabetes: The first test is performed 5 years after diagnosis.  Type 2 diabetes: The first test is performed at the time of diagnosis.  A serum creatinine and estimated glomerular filtration rate (eGFR) test is done once a year to assess the level of chronic kidney disease (CKD),  if present.  Lipid profile (cholesterol, HDL, LDL, triglycerides)  Performed every 5 years for most people.  The goal for LDL is less than 100 mg/dL. If you are at high risk, the goal is less than 70 mg/dL.  The goal for HDL is 40 mg/dL to 50 mg/dL for men and 50 mg/dL to 60 mg/dL for women. An HDL cholesterol of 60 mg/dL or higher gives some protection against heart disease.  The goal for triglycerides is less than 150 mg/dL.  Influenza vaccine, pneumococcal vaccine, and hepatitis B vaccine  The influenza vaccine is recommended yearly.  The pneumococcal vaccine is generally given once in a lifetime. However, there are some instances when another vaccination is recommended. Check with your health care provider.  The hepatitis B vaccine is also  recommended for adults with diabetes.  Diabetes self-management education  Education is recommended at diagnosis and ongoing as needed.  Treatment plan  Your treatment plan is reviewed at every medical visit.  Document Released: 12/05/2008 Document Revised: 10/10/2012 Document Reviewed: 07/10/2012 [facility] Patient Information 2014 [place].

## 2014-11-14 ENCOUNTER — Ambulatory Visit: Payer: Medicare Other | Admitting: Cardiovascular Disease

## 2014-11-24 ENCOUNTER — Encounter: Payer: Self-pay | Admitting: Pharmacist

## 2014-11-24 ENCOUNTER — Ambulatory Visit (INDEPENDENT_AMBULATORY_CARE_PROVIDER_SITE_OTHER): Payer: Medicare Other | Admitting: Pharmacist

## 2014-11-24 VITALS — BP 122/78 | HR 84 | Ht 69.0 in | Wt 218.0 lb

## 2014-11-24 DIAGNOSIS — I10 Essential (primary) hypertension: Secondary | ICD-10-CM | POA: Diagnosis not present

## 2014-11-24 DIAGNOSIS — L84 Corns and callosities: Secondary | ICD-10-CM

## 2014-11-24 DIAGNOSIS — E1142 Type 2 diabetes mellitus with diabetic polyneuropathy: Secondary | ICD-10-CM

## 2014-11-24 DIAGNOSIS — I209 Angina pectoris, unspecified: Secondary | ICD-10-CM

## 2014-11-24 DIAGNOSIS — E785 Hyperlipidemia, unspecified: Secondary | ICD-10-CM

## 2014-11-24 NOTE — Patient Instructions (Addendum)
Increase Levemir to 16 units today (10/3) and tomorrow (10/4).  Then increase to 18 units 10/5 and 10/6.  Then increase to 20 units daily and continue until next appointment.   Humalog / Novolog Insulin dose - sliding scale. Check Blood glucose prior to each meal and given insulin according to below  Less than 80 - no insulin  80 to 120 - 4 units  121- 200 - 5 units  201 - 250 - 6 units  250 - 300 - 7 units  301 - 350 - 8 units  351 or above - 10 units  Continue metformin XR 500mg  - take 1 tablet daily with food.   Call me if your blood glucose is not improving Cherre Robins - [phone]

## 2014-11-24 NOTE — Progress Notes (Signed)
Diabetes Visit  Chief Complaint:   Chief Complaint  Patient presents with  . Diabetes     Filed Vitals:   11/24/14 1159  BP: 122/78  Pulse: 84   Filed Weights   11/24/14 1159  Weight: 218 lb (98.884 kg)   Body mass index is 32.18 kg/(m^2).  HPI: patient with uncontrolled DM diagnosed around 2007.   Last visit with me was about 2 weeks ago.  Intensive education on insulin types - long vs. Short acting and Levemir was restarted.  Patient's most recent A1c was greater 14% (10/23/2014) He is taking Levemir daily and Humalog usually once daily around 3pm which BP is often over 200.   Currently diabetes medication regimen is Levemir 14 units qam and Humalog per sliding scale before meals.  He states that he does not follow sliding scale and that 6 units of Humalog is the most he feels comfortable giving per dose.   He reports that diarrhea has improved greatly since changed from metformin 1000mg  to Metformin XR 500mg  daily.  Did not bring in glucometer but reports HBG ranging from 111 to 265    Low fat/carbohydrate diet?  Improving he is trying to decrease CHO and sugar in take but patient admits to moments of indiscretion Nicotine Abuse?  No Medication Compliance?  No  Exercise?  No Alcohol use?  Yes - 1 to 2 beer per day  Exam Edema:  negative   Polyuria:  Negative over last 7 to 10 days  Polydipsia:  negative Polyphagia:  negataive  Weight changes:  Increased by 10# over last 2 weeks General Appearance:  alert, oriented, no acute distress and obese Mood/Affect:  normal   Lab Results  Component Value Date   HGBA1C >14.0 10/23/2014     Assessment: Type 2 diabetes requiring insulin - inadequately controlled but improving Obesity - weight increased HTN - controlled based on today's BP Hyperlipidemia - elevated LDL and triglycerides - related to non compliance which has improved since our last visit 2 weeks ago.  Not due to check lipids until 01/2015 or  02/2015  Recommendations: 1.  Reviewed again differences between long and short acting insulins.     2.  Medication recommendations at this time are as follows:    Increase Levemir - by 2 units every 2 days until up to 20 units daily.    Continue metformin XR 500mg  take 1 tablet daily.  Will try to increase in future as patient is able to tolerate.   Humalog / Novolog Insulin dose - sliding scale. Check Blood glucose prior to each meal and given insulin according to below  Less than 80 - no insulin  80 to 120 - 4 units  121- 200 - 5 units  201 - 250 - 6 units  250 - 300 - 7 units  301 - 350 - 8 units  351 or above - 10 units     3. Reviewed HBG goals:  Fasting 80-130 and 1-2 hour post prandial <180.  Patient is instructed to check BG 4 times per day.  Reminded patient that I am here to assist him in better BG control.  He can call and we can adjust insulin over phone if BG is not improving or if he has low BG less than 70 or high BG over 300 more than 2 times per week.   4.  Dietary recommendations:  Reviewed CHO counting  -  5.  Physical Activity recommendations:  Continue gardening and suggested walking -  goal is 30 minutes at least 5 days per week 6.  Patient requested referral to podiatrist for foot and nail care. -Referral completed. 7. Discussed getting nfluenza vaccine today - patient would liek to postpone for 1-2 weeks as he is having a lot of post nasal drip from allergies - using fluticasone NS and  Cetirizine regularly now.  appt for flu vaccine - 12/03/2014 at 8:30am  8.  Return to clinic in 8 weeks   Time spent counseling patient:  30 minutes   Cherre Robins, PharmD, CPP, CDE

## 2014-11-25 ENCOUNTER — Emergency Department (HOSPITAL_COMMUNITY): Payer: Medicare Other

## 2014-11-25 ENCOUNTER — Emergency Department (HOSPITAL_COMMUNITY)
Admission: EM | Admit: 2014-11-25 | Discharge: 2014-11-25 | Disposition: A | Discharge status: Home / Self Care | Payer: Medicare Other | Attending: Emergency Medicine | Admitting: Emergency Medicine

## 2014-11-25 ENCOUNTER — Encounter (HOSPITAL_COMMUNITY): Payer: Self-pay | Admitting: Emergency Medicine

## 2014-11-25 DIAGNOSIS — E785 Hyperlipidemia, unspecified: Secondary | ICD-10-CM | POA: Diagnosis not present

## 2014-11-25 DIAGNOSIS — Z880 Allergy status to penicillin: Secondary | ICD-10-CM | POA: Diagnosis not present

## 2014-11-25 DIAGNOSIS — E119 Type 2 diabetes mellitus without complications: Secondary | ICD-10-CM | POA: Diagnosis not present

## 2014-11-25 DIAGNOSIS — Z7951 Long term (current) use of inhaled steroids: Secondary | ICD-10-CM | POA: Diagnosis not present

## 2014-11-25 DIAGNOSIS — Z792 Long term (current) use of antibiotics: Secondary | ICD-10-CM | POA: Insufficient documentation

## 2014-11-25 DIAGNOSIS — Z8619 Personal history of other infectious and parasitic diseases: Secondary | ICD-10-CM | POA: Insufficient documentation

## 2014-11-25 DIAGNOSIS — R079 Chest pain, unspecified: Secondary | ICD-10-CM

## 2014-11-25 DIAGNOSIS — Z794 Long term (current) use of insulin: Secondary | ICD-10-CM | POA: Insufficient documentation

## 2014-11-25 DIAGNOSIS — Z8719 Personal history of other diseases of the digestive system: Secondary | ICD-10-CM | POA: Insufficient documentation

## 2014-11-25 DIAGNOSIS — M25551 Pain in right hip: Secondary | ICD-10-CM

## 2014-11-25 DIAGNOSIS — I509 Heart failure, unspecified: Secondary | ICD-10-CM | POA: Diagnosis not present

## 2014-11-25 DIAGNOSIS — I10 Essential (primary) hypertension: Secondary | ICD-10-CM | POA: Diagnosis not present

## 2014-11-25 DIAGNOSIS — R0789 Other chest pain: Secondary | ICD-10-CM | POA: Diagnosis not present

## 2014-11-25 DIAGNOSIS — J0110 Acute frontal sinusitis, unspecified: Secondary | ICD-10-CM | POA: Diagnosis not present

## 2014-11-25 LAB — MAGNESIUM: MAGNESIUM: 2 mg/dL (ref 1.7–2.4)

## 2014-11-25 LAB — BASIC METABOLIC PANEL
ANION GAP: 7 (ref 5–15)
BUN: 18 mg/dL (ref 6–20)
CALCIUM: 8.4 mg/dL — AB (ref 8.9–10.3)
CO2: 25 mmol/L (ref 22–32)
CREATININE: 0.9 mg/dL (ref 0.61–1.24)
Chloride: 108 mmol/L (ref 101–111)
GFR calc non Af Amer: >60 mL/min (ref >60)
Glucose, Bld: 180 mg/dL — ABNORMAL HIGH (ref 65–99)
Potassium: 3.8 mmol/L (ref 3.5–5.1)
SODIUM: 140 mmol/L (ref 135–145)

## 2014-11-25 LAB — TROPONIN I: TROPONIN I: 0.03 ng/mL (ref <0.031)

## 2014-11-25 LAB — CBC
HCT: 38.8 % — ABNORMAL LOW (ref 39.0–52.0)
HEMOGLOBIN: 13.2 g/dL (ref 13.0–17.0)
MCH: 27.8 pg (ref 26.0–34.0)
MCHC: 34 g/dL (ref 30.0–36.0)
MCV: 81.9 fL (ref 78.0–100.0)
PLATELETS: 165 10*3/uL (ref 150–400)
RBC: 4.74 MIL/uL (ref 4.22–5.81)
RDW: 12.7 % (ref 11.5–15.5)
WBC: 5.9 10*3/uL (ref 4.0–10.5)

## 2014-11-25 LAB — I-STAT TROPONIN, ED: TROPONIN I, POC: 0 ng/mL (ref 0.00–0.08)

## 2014-11-25 MED ORDER — ASPIRIN 325 MG PO TABS
325.0000 mg | ORAL_TABLET | Freq: Once | ORAL | Status: AC
  Administered 2014-11-25: 325 mg via ORAL
  Filled 2014-11-25: qty 1

## 2014-11-25 MED ORDER — HYDROCODONE-HOMATROPINE 5-1.5 MG/5ML PO SYRP: 5.0000 mL | ORAL_SOLUTION | Freq: Four times a day (QID) | ORAL | Status: DC | PRN

## 2014-11-25 MED ORDER — MAGNESIUM SULFATE 2 GM/50ML IV SOLN
2.0000 g | Freq: Once | INTRAVENOUS | Status: AC
  Administered 2014-11-25: 2 g via INTRAVENOUS
  Filled 2014-11-25: qty 50

## 2014-11-25 MED ORDER — MINOCYCLINE HCL 100 MG PO CAPS: 100.0000 mg | ORAL_CAPSULE | Freq: Two times a day (BID) | ORAL | Status: DC

## 2014-11-25 NOTE — ED Notes (Signed)
Pt alert & oriented x4, stable gait. Patient given discharge instructions, paperwork & prescription(s). Patient informed not to drive, operate any equipment & handel any important documents 4 hours after taking pain medication. Patient  instructed to stop at the registration desk to finish any additional paperwork. Patient  verbalized understanding. Pt left department w/ no further questions. 

## 2014-11-25 NOTE — ED Provider Notes (Signed)
CSN: [id number]     Arrival date & time 11/25/14  1632 History   First MD Initiated Contact with Patient 11/25/14 1709     Chief Complaint  Patient presents with  . Leg Pain  . Chest Pain     (Consider location/radiation/quality/duration/timing/severity/associated sxs/prior Treatment) Patient is a 59 y.o. male presenting with leg pain and chest pain.  Leg Pain Location:  Leg Leg location:  R upper leg Pain details:    Quality:  Sharp   Radiates to:  Does not radiate   Severity:  Mild   Duration:  1 day   Timing:  Intermittent Chronicity:  New Dislocation: no   Prior injury to area:  No Relieved by: improved with rest and time. Associated symptoms: no back pain, no fever, no itching, no muscle weakness and no neck pain   Chest Pain Pain location:  L chest Pain quality: aching and pressure   Pain radiates to:  Does not radiate Pain radiates to the back: no   Pain severity:  No pain Timing:  Intermittent Chronicity:  New Context: not breathing, not eating and no movement   Relieved by:  None tried Worsened by:  Nothing tried Ineffective treatments:  None tried Associated symptoms: cough   Associated symptoms: no abdominal pain, no back pain, no fever, no nausea and no shortness of breath     Past Medical History  Diagnosis Date  . CHF (congestive heart failure) ([place])   . HTN (hypertension)   . Acid reflux   . Onychomycosis   . Back pain     f4 and f5  . Diverticulosis   . Hyperlipidemia   . Diabetes mellitus 2005  . H/O chest pain 2005    ECHO  . Hyperlipemia 11/16/2009    Qualifier: Diagnosis of  By: [name]     Past Surgical History  Procedure Laterality Date  . Appendectomy    . Colonoscopy N/A 08/01/2013    Procedure: COLONOSCOPY;  Surgeon: Rogene Houston, MD;  Location: [facility];  Service: Endoscopy;  Laterality: N/A;  rescheduled to [place] notified pt  . Cardiac catheterization  2005    4 frech cath   Family History  Problem  Relation Age of Onset  . Hypertension Father   . Diabetes Father   . Cancer Mother   . Alcohol abuse Brother   . Colon cancer Neg Hx    Social History  Substance Use Topics  . Smoking status: Never Smoker   . Smokeless tobacco: Current User    Types: Snuff     Comment: dips snuff about 6 cans/week for 3 years  . Alcohol Use: 0.6 oz/week    1 Cans of beer per week     Comment: occ     Review of Systems  Constitutional: Negative for fever and chills.  HENT: Negative for congestion and drooling.   Eyes: Negative for pain.  Respiratory: Positive for cough. Negative for shortness of breath.   Cardiovascular: Positive for chest pain.  Gastrointestinal: Negative for nausea, abdominal pain, diarrhea and constipation.  Musculoskeletal: Negative for back pain and neck pain.  Skin: Negative for itching.  All other systems reviewed and are negative.     Allergies  Sulfa antibiotics and Penicillins  Home Medications   Prior to Admission medications   Medication Sig Start Date End Date Taking? Authorizing Provider  amLODipine (NORVASC) 5 MG tablet Take 1 tablet (5 mg total) by mouth daily. 01/01/14   Herminio Commons,  MD  aspirin EC 81 MG tablet Take 81 mg by mouth daily.    Historical Provider, MD  carvedilol (COREG) 25 MG tablet Take 1 tablet (25 mg total) by mouth 2 (two) times daily. 01/01/14   Herminio Commons, MD  cetirizine (ZYRTEC) 10 MG tablet Take 1 tablet (10 mg total) by mouth daily. 05/10/13   Vernie Shanks, MD  fluticasone [facility]) 50 MCG/ACT nasal spray USE 2 SPRAYS IN [facility] NOSTRIL DAILY 12/25/13   Mary-Margaret Hassell Done, FNP  furosemide (LASIX) 40 MG tablet Take 1 tablet (40 mg total) by mouth daily. 01/31/13   Vernie Shanks, MD  gabapentin (NEURONTIN) 300 MG capsule Take 1 capsule (300 mg total) by mouth 3 (three) times daily. Patient taking differently: Take 300 mg by mouth at bedtime.  06/24/14   [name], MD  glucose blood (ACCU-CHEK AVIVA PLUS) test  strip Use as instructed Patient taking differently: 1 each by Other route 3 (three) times daily. Use as instructed 05/17/12   Gearlean Alf, PA-C  Insulin Detemir (LEVEMIR FLEXTOUCH) 100 UNIT/ML Pen Inject 10 to 20 units once a day 11/07/14   Tammy Eckard, PHARMD  insulin lispro (HUMALOG KWIKPEN) 100 UNIT/ML KiwkPen Inject up to 15 units prior to each meal and as needed for elevated BG 07/03/14   Tammy Eckard, PHARMD  Insulin Syringe-Needle U-100 (B-D INS SYRINGE 0.5CC/30GX1/2") 30G X 1/2" 0.5 ML MISC Use as directed 01/31/13   Vernie Shanks, MD  isosorbide mononitrate (IMDUR) 60 MG 24 hr tablet Take 1.5 tablets (90 mg total) by mouth daily. 07/11/14   Herminio Commons, MD  lisinopril (PRINIVIL,ZESTRIL) 40 MG tablet Take 1 tablet (40 mg total) by mouth daily. 11/09/12   Vernie Shanks, MD  metFORMIN (GLUCOPHAGE XR) 500 MG 24 hr tablet Take 1 tablet (500 mg total) by mouth daily with breakfast. 11/07/14   Tammy Eckard, PHARMD  Multiple Vitamins-Minerals (CENTRUM SILVER ADULT 50+ PO) Take 1 tablet by mouth daily.    Historical Provider, MD  NEXIUM 40 MG capsule Take 40 mg by mouth daily. 11/14/14   Historical Provider, MD  nitroGLYCERIN (NITROSTAT) 0.4 MG SL tablet Place 1 tablet (0.4 mg total) under the tongue every 5 (five) minutes as needed. 05/10/13   Vernie Shanks, MD  omega-3 fish oil (MAXEPA) 1000 MG CAPS capsule Take 2 capsules by mouth daily.  07/26/12   Vernie Shanks, MD  ranitidine (ZANTAC) 150 MG tablet Take 1 tablet (150 mg total) by mouth 2 (two) times daily. Patient not taking: Reported on 11/24/2014 05/14/14   Tammy Eckard, PHARMD  rosuvastatin (CRESTOR) 20 MG tablet Take 1 tablet (20 mg total) by mouth daily. 01/01/14   Herminio Commons, MD  UNIFINE PENTIPS 32G X 4 MM MISC 4 TIMES DAILY 09/26/14   [name], MD   BP 147/93 mmHg  Pulse 88  Temp(Src) 98.2 F (36.8 C) (Oral)  Resp 15  Ht 5\' 9"  (1.753 m)  Wt 220 lb (99.791 kg)  BMI 32.47 kg/m2  SpO2 95% Physical Exam   Constitutional: He is oriented to person, place, and time. He appears well-developed and well-nourished.  HENT:  Head: Normocephalic and atraumatic.  Nose: Mucosal edema and rhinorrhea present.  Mouth/Throat: Posterior oropharyngeal erythema present.  Eyes: Conjunctivae and EOM are normal.  Neck: Normal range of motion. Neck supple.  Cardiovascular: Normal rate and regular rhythm.   Pulmonary/Chest: Effort normal. No respiratory distress. He exhibits no tenderness.  Abdominal: Soft. There is no  tenderness.  Musculoskeletal: Normal range of motion. He exhibits no edema or tenderness.  Neurological: He is alert and oriented to person, place, and time.  Skin: Skin is warm and dry.  Nursing note and vitals reviewed.   ED Course  Procedures (including critical care time) Labs Review Labs Reviewed  BASIC METABOLIC PANEL - Abnormal; Notable for the following:    Glucose, Bld 180 (*)    Calcium 8.4 (*)    All other components within normal limits  CBC - Abnormal; Notable for the following:    HCT 38.8 (*)    All other components within normal limits  MAGNESIUM  TROPONIN I  I-STAT TROPOININ, ED    Imaging Review Dg Chest 2 View  11/25/2014   CLINICAL DATA:  Chest discomfort  EXAM: CHEST  2 VIEW  COMPARISON:  03/07/2014  FINDINGS: There is resolution of right upper lobe infiltrate since 03/07/2014. The lungs are clear. There is no pleural effusion. Hilar and mediastinal contours appear unremarkable. Heart size is upper normal, unchanged. Pulmonary vasculature is normal.  IMPRESSION: No acute findings. Resolved right upper lobe infiltrate since 03/07/2014.   Electronically Signed   By: Andreas Newport M.D.   On: 11/25/2014 18:21   Dg Hip Unilat With Pelvis 2-3 Views Right  11/25/2014   CLINICAL DATA:  Pt c/o of RT leg pain. Denies injury/fall. Pt also states, "it feels like something is pulling in my chest." Denies CP or SOB.  EXAM: DG HIP (WITH OR WITHOUT PELVIS) 2-3V RIGHT   COMPARISON:  None.  FINDINGS: There is no evidence of hip fracture or dislocation. There is no evidence of arthropathy or other focal bone abnormality.  IMPRESSION: Negative.   Electronically Signed   By: Nolon Nations M.D.   On: 11/25/2014 18:22   I have personally reviewed and evaluated these images and lab results as part of my medical decision-making.   EKG Interpretation   Date/Time:  Tuesday November 25 2014 16:45:09 EDT Ventricular Rate:  88 PR Interval:  177 QRS Duration: 90 QT Interval:  470 QTC Calculation: 569 R Axis:   -23 Text Interpretation:  Sinus rhythm Borderline left axis deviation  Borderline T wave abnormalities Prolonged QT interval No significant  change since last tracing aside from prolonged QT. Confirmed by [facility] MD,  Corene Cornea [phone]) on 11/25/2014 5:39:59 PM      MDM   Final diagnoses:  Acute frontal sinusitis, recurrence not specified  Chest pain, unspecified chest pain type   Low risk ACS, will delta troponin, more likely MSK v pleuritic 2/2 cough/bronchitis. Leg pain sounds more MSK. No swelling or posterior tenderness to suggest DVT. Patient has a prolonged QT interval on his EKG so we'll give him magnesium. Also given her symptomatic control. We'll need to repeat EKG prior to discharge to ensure his QT has shortened. Carvedilol seems to be the only medication on his list that would be effectiveness. I hesitate stopping this so we will need follow-up with his cardiologist for further evaluation.  Delta troponin negative. He is stable without any ectopy on the monitor. Stable for discharge and PCP follow-up.  I have personally and contemperaneously reviewed labs and imaging and used in my decision making as above.   A medical screening exam was performed and I feel the patient has had an appropriate workup for their chief complaint at this time and likelihood of emergent condition existing is low. They have been counseled on decision, discharge, follow up  and which symptoms necessitate immediate  return to the emergency department. They or their family verbally stated understanding and agreement with plan and discharged in stable condition.      Merrily Pew, MD 11/25/14 2350

## 2014-11-25 NOTE — ED Notes (Signed)
PT now complaining of left sided chest pressure.

## 2014-11-25 NOTE — ED Notes (Signed)
Pt ambulated to restroom & returned to room w/ no complications. 

## 2014-11-25 NOTE — ED Notes (Signed)
MD at bedside. 

## 2014-11-25 NOTE — ED Notes (Addendum)
Pt c/o of RT leg pain. Denies injury/fall. Pt also states, "it feels like something is pulling in my chest." Denies CP or SOB.

## 2014-12-03 ENCOUNTER — Ambulatory Visit: Payer: Medicare Other

## 2014-12-09 DIAGNOSIS — M7741 Metatarsalgia, right foot: Secondary | ICD-10-CM | POA: Diagnosis not present

## 2014-12-09 DIAGNOSIS — M7742 Metatarsalgia, left foot: Secondary | ICD-10-CM | POA: Diagnosis not present

## 2014-12-09 DIAGNOSIS — M79673 Pain in unspecified foot: Secondary | ICD-10-CM | POA: Diagnosis not present

## 2014-12-23 ENCOUNTER — Other Ambulatory Visit: Payer: Self-pay | Admitting: Pharmacist

## 2014-12-23 ENCOUNTER — Other Ambulatory Visit: Payer: Self-pay | Admitting: *Deleted

## 2014-12-23 ENCOUNTER — Other Ambulatory Visit: Payer: Self-pay | Admitting: Nurse Practitioner

## 2014-12-23 MED ORDER — CARVEDILOL 12.5 MG PO TABS: 12.5000 mg | ORAL_TABLET | Freq: Two times a day (BID) | ORAL | Status: DC

## 2014-12-30 ENCOUNTER — Emergency Department (HOSPITAL_COMMUNITY)
Admission: EM | Admit: 2014-12-30 | Discharge: 2014-12-30 | Disposition: A | Discharge status: Home / Self Care | Payer: Medicare Other | Attending: Emergency Medicine | Admitting: Emergency Medicine

## 2014-12-30 ENCOUNTER — Encounter (HOSPITAL_COMMUNITY): Payer: Self-pay | Admitting: Cardiology

## 2014-12-30 DIAGNOSIS — I10 Essential (primary) hypertension: Secondary | ICD-10-CM | POA: Insufficient documentation

## 2014-12-30 DIAGNOSIS — K219 Gastro-esophageal reflux disease without esophagitis: Secondary | ICD-10-CM | POA: Diagnosis not present

## 2014-12-30 DIAGNOSIS — I509 Heart failure, unspecified: Secondary | ICD-10-CM | POA: Insufficient documentation

## 2014-12-30 DIAGNOSIS — M545 Low back pain, unspecified: Secondary | ICD-10-CM

## 2014-12-30 DIAGNOSIS — E119 Type 2 diabetes mellitus without complications: Secondary | ICD-10-CM | POA: Insufficient documentation

## 2014-12-30 DIAGNOSIS — Z87828 Personal history of other (healed) physical injury and trauma: Secondary | ICD-10-CM | POA: Insufficient documentation

## 2014-12-30 DIAGNOSIS — Z880 Allergy status to penicillin: Secondary | ICD-10-CM | POA: Insufficient documentation

## 2014-12-30 DIAGNOSIS — Z794 Long term (current) use of insulin: Secondary | ICD-10-CM | POA: Insufficient documentation

## 2014-12-30 DIAGNOSIS — Z7982 Long term (current) use of aspirin: Secondary | ICD-10-CM | POA: Diagnosis not present

## 2014-12-30 DIAGNOSIS — E785 Hyperlipidemia, unspecified: Secondary | ICD-10-CM | POA: Diagnosis not present

## 2014-12-30 DIAGNOSIS — Z79899 Other long term (current) drug therapy: Secondary | ICD-10-CM | POA: Diagnosis not present

## 2014-12-30 DIAGNOSIS — Z7951 Long term (current) use of inhaled steroids: Secondary | ICD-10-CM | POA: Diagnosis not present

## 2014-12-30 MED ORDER — NAPROXEN 500 MG PO TABS: 500.0000 mg | ORAL_TABLET | Freq: Two times a day (BID) | ORAL | Status: DC

## 2014-12-30 MED ORDER — KETOROLAC TROMETHAMINE 60 MG/2ML IM SOLN
60.0000 mg | Freq: Once | INTRAMUSCULAR | Status: AC
  Administered 2014-12-30: 60 mg via INTRAMUSCULAR
  Filled 2014-12-30: qty 2

## 2014-12-30 MED ORDER — METHOCARBAMOL 500 MG PO TABS: 500.0000 mg | ORAL_TABLET | Freq: Three times a day (TID) | ORAL | Status: DC

## 2014-12-30 MED ORDER — HYDROCODONE-ACETAMINOPHEN 5-325 MG PO TABS: ORAL_TABLET | ORAL | Status: DC

## 2014-12-30 MED ORDER — METHOCARBAMOL 500 MG PO TABS
500.0000 mg | ORAL_TABLET | Freq: Once | ORAL | Status: AC
  Administered 2014-12-30: 500 mg via ORAL
  Filled 2014-12-30: qty 1

## 2014-12-30 NOTE — Discharge Instructions (Signed)

## 2014-12-30 NOTE — ED Notes (Signed)
Lower back pain times 2-3 days.  States he has an old injury to L4 and L5.

## 2014-12-30 NOTE — ED Provider Notes (Signed)
CSN: [id number]     Arrival date & time 12/30/14  [id number] History   First MD Initiated Contact with Patient 12/30/14 0940     Chief Complaint  Patient presents with  . Back Pain     (Consider location/radiation/quality/duration/timing/severity/associated sxs/prior Treatment) HPI  Brad Mendoza is a 59 y.o. male who presents to the Emergency Department complaining of low back pain for 2-3 days.  He reports hx of "disc problems" to his lower back since 2005.  Intermittent pain  To his back, but increased pain after moving furniture last week.  He describes an aching pain to his back and bilateral buttocks.  He has tried OTC analgesics without relief.  He denies recent trauma, fever, chills, abdominal pain, pain, numbness or weakness of the LE's, and urine or bowel changes.     Past Medical History  Diagnosis Date  . CHF (congestive heart failure) ([place])   . HTN (hypertension)   . Acid reflux   . Onychomycosis   . Back pain     f4 and f5  . Diverticulosis   . Hyperlipidemia   . Diabetes mellitus 2005  . H/O chest pain 2005    ECHO  . Hyperlipemia 11/16/2009    Qualifier: Diagnosis of  By: [name]     Past Surgical History  Procedure Laterality Date  . Appendectomy    . Colonoscopy N/A 08/01/2013    Procedure: COLONOSCOPY;  Surgeon: Rogene Houston, MD;  Location: [facility];  Service: Endoscopy;  Laterality: N/A;  rescheduled to [place] notified pt  . Cardiac catheterization  2005    4 frech cath   Family History  Problem Relation Age of Onset  . Hypertension Father   . Diabetes Father   . Cancer Mother   . Alcohol abuse Brother   . Colon cancer Neg Hx    Social History  Substance Use Topics  . Smoking status: Never Smoker   . Smokeless tobacco: Current User    Types: Snuff     Comment: dips snuff about 6 cans/week for 3 years  . Alcohol Use: 0.6 oz/week    1 Cans of beer per week     Comment: occ     Review of Systems  Constitutional: Negative for  fever.  Respiratory: Negative for shortness of breath.   Gastrointestinal: Negative for vomiting, abdominal pain and constipation.  Genitourinary: Negative for dysuria, hematuria, flank pain, decreased urine volume and difficulty urinating.  Musculoskeletal: Positive for back pain. Negative for joint swelling.  Skin: Negative for rash.  Neurological: Negative for weakness and numbness.  All other systems reviewed and are negative.     Allergies  Sulfa antibiotics and Penicillins  Home Medications   Prior to Admission medications   Medication Sig Start Date End Date Taking? Authorizing Provider  amLODipine (NORVASC) 5 MG tablet Take 1 tablet (5 mg total) by mouth daily. 01/01/14  Yes Herminio Commons, MD  carvedilol (COREG) 12.5 MG tablet Take 1 tablet (12.5 mg total) by mouth 2 (two) times daily with a meal. 12/23/14  Yes Herminio Commons, MD  fluticasone (FLONASE) 50 MCG/ACT nasal spray USE 2 SPRAYS IN EACH NOSTRIL DAILY 12/25/13  Yes Mary-Margaret Hassell Done, FNP  furosemide (LASIX) 40 MG tablet Take 1 tablet (40 mg total) by mouth daily. 01/31/13  Yes Vernie Shanks, MD  Insulin Detemir (LEVEMIR FLEXTOUCH) 100 UNIT/ML Pen Inject 10 to 20 units once a day 11/07/14  Yes Gagan Dillion Eckard, PHARMD  insulin lispro (  HUMALOG KWIKPEN) 100 UNIT/ML KiwkPen Inject up to 15 units prior to each meal and as needed for elevated BG 07/03/14  Yes Shanty Ginty Eckard, PHARMD  isosorbide mononitrate (IMDUR) 60 MG 24 hr tablet Take 1.5 tablets (90 mg total) by mouth daily. 07/11/14  Yes Herminio Commons, MD  lisinopril (PRINIVIL,ZESTRIL) 40 MG tablet Take 1 tablet (40 mg total) by mouth daily. 11/09/12  Yes Vernie Shanks, MD  aspirin EC 81 MG tablet Take 81 mg by mouth daily.    Historical Provider, MD  gabapentin (NEURONTIN) 300 MG capsule Take 1 capsule (300 mg total) by mouth 3 (three) times daily. Patient taking differently: Take 300 mg by mouth at bedtime.  06/24/14   [name], MD  glucose blood  (ACCU-CHEK AVIVA PLUS) test strip Use as instructed Patient taking differently: 1 each by Other route 3 (three) times daily. Use as instructed 05/17/12   Gearlean Alf, PA-C  HYDROcodone-homatropine [facility]) 5-1.5 MG/5ML syrup Take 5 mLs by mouth every 6 (six) hours as needed for cough. Patient not taking: Reported on 12/30/2014 11/25/14   Merrily Pew, MD  Insulin Syringe-Needle U-100 (B-D INS SYRINGE 0.5CC/30GX1/2") 30G X 1/2" 0.5 ML MISC Use as directed 01/31/13   Vernie Shanks, MD  metFORMIN (GLUCOPHAGE-XR) 500 MG 24 hr tablet TAKE 1 TABLET EVERY MORNING WITH BREAKFAST 12/23/14   Kareemah Grounds Eckard, PHARMD  minocycline (MINOCIN,DYNACIN) 100 MG capsule Take 1 capsule (100 mg total) by mouth 2 (two) times daily. Patient not taking: Reported on 12/30/2014 11/25/14   Merrily Pew, MD  Multiple Vitamins-Minerals (CENTRUM SILVER ADULT 50+ PO) Take 1 tablet by mouth daily.    Historical Provider, MD  NEXIUM 40 MG capsule Take 40 mg by mouth daily. 11/14/14   Historical Provider, MD  NEXIUM 40 MG capsule TAKE 1 CAPSULE DAILY AT NOON 12/23/14   [name], MD  nitroGLYCERIN (NITROSTAT) 0.4 MG SL tablet Place 1 tablet (0.4 mg total) under the tongue every 5 (five) minutes as needed. 05/10/13   Vernie Shanks, MD  rosuvastatin (CRESTOR) 20 MG tablet Take 1 tablet (20 mg total) by mouth daily. 01/01/14   Herminio Commons, MD  UNIFINE PENTIPS 32G X 4 MM MISC 4 TIMES DAILY 09/26/14   [name], MD   BP 154/88 mmHg  Pulse 86  Temp(Src) 98 F (36.7 C) (Oral)  Resp 16  Ht 5\' 9"  (1.753 m)  Wt 225 lb (102.059 kg)  BMI 33.21 kg/m2  SpO2 99% Physical Exam  Constitutional: He is oriented to person, place, and time. He appears well-developed and well-nourished. No distress.  HENT:  Head: Normocephalic and atraumatic.  Neck: Normal range of motion. Neck supple.  Cardiovascular: Normal rate, regular rhythm, normal heart sounds and intact distal pulses.   No murmur heard. Pulmonary/Chest: Effort normal  and breath sounds normal. No respiratory distress.  Abdominal: Soft. He exhibits no distension. There is no tenderness.  Musculoskeletal: He exhibits tenderness. He exhibits no edema.       Lumbar back: He exhibits tenderness and pain. He exhibits normal range of motion, no swelling, no deformity, no laceration and normal pulse.  ttp of the lower lumbar spine and bilateral lumbar paraspinal muscles. DP pulses are brisk and symmetrical.  Distal sensation intact.  Pt has 5/5 strength against resistance of bilateral lower extremities.     Neurological: He is alert and oriented to person, place, and time. He has normal strength. No sensory deficit. He exhibits normal muscle tone. Coordination  and gait normal.  Reflex Scores:      Patellar reflexes are 2+ on the right side and 2+ on the left side.      Achilles reflexes are 2+ on the right side and 2+ on the left side. Skin: Skin is warm and dry. No rash noted.  Nursing note and vitals reviewed.   ED Course  Procedures (including critical care time)   MDM   Final diagnoses:  Bilateral low back pain without sciatica    Pt with recurrent low back pain.  No focal neuro deficits on exam.  Ambulates with a steady gait. Diffuse ttp of the lower lumbar spine and paraspinal muscles.  No concerning s'x for emergent neurological or infectious process. Pt agrees to symptomatic tx and close f/u with PMD.      Kem Parkinson, PA-C 12/30/14 2113  Forde Dandy, MD 12/31/14 [phone]

## 2015-01-05 ENCOUNTER — Ambulatory Visit (INDEPENDENT_AMBULATORY_CARE_PROVIDER_SITE_OTHER): Payer: Medicare Other | Admitting: Internal Medicine

## 2015-01-05 ENCOUNTER — Encounter (INDEPENDENT_AMBULATORY_CARE_PROVIDER_SITE_OTHER): Payer: Self-pay | Admitting: Internal Medicine

## 2015-01-05 VITALS — BP 124/70 | HR 56 | Temp 98.5°F | Ht 69.0 in | Wt 214.7 lb

## 2015-01-05 DIAGNOSIS — I209 Angina pectoris, unspecified: Secondary | ICD-10-CM

## 2015-01-05 DIAGNOSIS — K6289 Other specified diseases of anus and rectum: Secondary | ICD-10-CM | POA: Diagnosis not present

## 2015-01-05 MED ORDER — HYDROCORTISONE 2.5 % RE CREA: 1.0000 "application " | TOPICAL_CREAM | Freq: Two times a day (BID) | RECTAL | Status: DC

## 2015-01-05 NOTE — Progress Notes (Signed)
Subjective:    Patient ID: Brad Mendoza, male    DOB: 08-Jun-1955, 59 y.o.   MRN: [id number]  HPI Here today for f/u. He was last seen in November of 2015. He tells me he is doing all right. Sometimes he has stinging in his rectum.  Usually occurs after a BM.  He has not seen any blood. He has not used any medication for the pain.  Appetite is good. No weight loss. He has actually gained about 3 pounds since his last visit. No abdominal pain. Last colonoscopy was in June of last year.  08/01/2013 Colonoscopy: Heme positive stool, rectal pain: Impression:  Examination performed to cecum. Scattered right-sided and sigmoid colon diverticula. Four small polyps removed as above and submitted together; two at hepatic flexure; one at proximal transverse colon and the fourth one at sigmoid colon. Small external hemorrhoids. No evidence of proctitis. Patient had 4 small polyps removed and these are tubular adenomas Results given to patient Next colonoscopy in 5 years Patient will keep symptom diary as to frequency of rectal pain and return office visit in 3 months. Report to PCP   Review of Systems Past Medical History  Diagnosis Date  . CHF (congestive heart failure) ([place])   . HTN (hypertension)   . Acid reflux   . Onychomycosis   . Back pain     f4 and f5  . Diverticulosis   . Hyperlipidemia   . Diabetes mellitus 2005  . H/O chest pain 2005    ECHO  . Hyperlipemia 11/16/2009    Qualifier: Diagnosis of  By: [name]      Past Surgical History  Procedure Laterality Date  . Appendectomy    . Colonoscopy N/A 08/01/2013    Procedure: COLONOSCOPY;  Surgeon: Rogene Houston, MD;  Location[facility];  Service: Endoscopy;  Laterality: N/A;  rescheduled to [place] notified pt  . Cardiac catheterization  2005    4 frech cath    Allergies  Allergen Reactions  . Sulfa Antibiotics Shortness Of Breath  . Penicillins Anxiety    REACTION: rash    Current Outpatient  Prescriptions on File Prior to Visit  Medication Sig Dispense Refill  . amLODipine (NORVASC) 5 MG tablet Take 1 tablet (5 mg total) by mouth daily. 90 tablet 3  . aspirin EC 81 MG tablet Take 81 mg by mouth daily.    . Capsaicin (ICY HOT ARTHRITIS THERAPY EX) Apply 1 application topically 2 (two) times daily.    . carvedilol (COREG) 12.5 MG tablet Take 1 tablet (12.5 mg total) by mouth 2 (two) times daily with a meal. 60 tablet 6  . fluticasone (FLONASE) 50 MCG/ACT nasal spray USE 2 SPRAYS IN EACH NOSTRIL DAILY 16 g 5  . furosemide (LASIX) 40 MG tablet Take 1 tablet (40 mg total) by mouth daily. 30 tablet 3  . gabapentin (NEURONTIN) 300 MG capsule Take 1 capsule (300 mg total) by mouth 3 (three) times daily. (Patient taking differently: Take 300 mg by mouth at bedtime. ) 90 capsule 0  . glucose blood (ACCU-CHEK AVIVA PLUS) test strip Use as instructed (Patient taking differently: 1 each by Other route 3 (three) times daily. Use as instructed) 100 each 0  . HYDROcodone-acetaminophen (NORCO/VICODIN) 5-325 MG tablet Take one-two tabs po q 4-6 hrs prn pain 15 tablet 0  . HYDROcodone-homatropine (HYCODAN) 5-1.5 MG/5ML syrup Take 5 mLs by mouth every 6 (six) hours as needed for cough. 120 mL 0  .  Insulin Detemir (LEVEMIR FLEXTOUCH) 100 UNIT/ML Pen Inject 10 to 20 units once a day 15 mL 1  . insulin lispro (HUMALOG KWIKPEN) 100 UNIT/ML KiwkPen Inject up to 15 units prior to each meal and as needed for elevated BG 15 mL 2  . Insulin Syringe-Needle U-100 (B-D INS SYRINGE 0.5CC/30GX1/2") 30G X 1/2" 0.5 ML MISC Use as directed 100 each 0  . isosorbide mononitrate (IMDUR) 60 MG 24 hr tablet Take 1.5 tablets (90 mg total) by mouth daily. 45 tablet 6  . lisinopril (PRINIVIL,ZESTRIL) 40 MG tablet Take 1 tablet (40 mg total) by mouth daily. 30 tablet 5  . metFORMIN (GLUCOPHAGE-XR) 500 MG 24 hr tablet TAKE 1 TABLET EVERY MORNING WITH BREAKFAST 30 tablet 2  . methocarbamol (ROBAXIN) 500 MG tablet Take 1 tablet (500  mg total) by mouth 3 (three) times daily. 21 tablet 0  . Multiple Vitamins-Minerals (CENTRUM SILVER ADULT 50+ PO) Take 1 tablet by mouth daily.    . naproxen (NAPROSYN) 500 MG tablet Take 1 tablet (500 mg total) by mouth 2 (two) times daily with a meal. 20 tablet 0  . NEXIUM 40 MG capsule Take 40 mg by mouth daily.    [name] NEXIUM 40 MG capsule TAKE 1 CAPSULE DAILY AT NOON 30 capsule 3  . nitroGLYCERIN (NITROSTAT) 0.4 MG SL tablet Place 1 tablet (0.4 mg total) under the tongue every 5 (five) minutes as needed. 30 tablet 1  . rosuvastatin (CRESTOR) 20 MG tablet Take 1 tablet (20 mg total) by mouth daily. 90 tablet 3  . UNIFINE PENTIPS 32G X 4 MM MISC 4 TIMES DAILY 100 each 1  . minocycline (MINOCIN,DYNACIN) 100 MG capsule Take 1 capsule (100 mg total) by mouth 2 (two) times daily. (Patient not taking: Reported on 12/30/2014) 10 capsule 0  . [DISCONTINUED] ranitidine (ZANTAC) 150 MG tablet Take 1 tablet (150 mg total) by mouth 2 (two) times daily. (Patient not taking: Reported on 11/24/2014) 90 tablet 1   No current facility-administered medications on file prior to visit.        Objective:   Physical Exam Blood pressure 124/70, pulse 56, temperature 98.5 F (36.9 C), height 5\' 9"  (1.753 m), weight 214 lb 11.2 oz (97.387 kg). Alert and oriented. Skin warm and dry. Oral mucosa is moist.   . Sclera anicteric, conjunctivae is pink. Thyroid not enlarged. No cervical lymphadenopathy. Lungs clear. Heart regular rate and rhythm.  Abdomen is soft. Bowel sounds are positive. No hepatomegaly. No abdominal masses felt. No tenderness.  No edema to lower extremities.         Assessment & Plan:  Rectal pain on occasion. Am going to try him on Anusol supp as needed and see how he does. He will call with a PR report. OV in years

## 2015-01-05 NOTE — Patient Instructions (Signed)
OV in 1 year. PR in 2 weeks.

## 2015-01-26 ENCOUNTER — Ambulatory Visit: Payer: Self-pay | Admitting: Pharmacist

## 2015-03-03 ENCOUNTER — Ambulatory Visit: Payer: Medicare Other | Admitting: Family Medicine

## 2015-03-04 ENCOUNTER — Encounter: Payer: Self-pay | Admitting: Family Medicine

## 2015-03-13 ENCOUNTER — Ambulatory Visit (INDEPENDENT_AMBULATORY_CARE_PROVIDER_SITE_OTHER): Payer: Medicare Other | Admitting: Family Medicine

## 2015-03-13 ENCOUNTER — Encounter: Payer: Self-pay | Admitting: Family Medicine

## 2015-03-13 VITALS — BP 150/84 | HR 88 | Temp 99.0°F | Ht 69.0 in | Wt 212.0 lb

## 2015-03-13 DIAGNOSIS — E1142 Type 2 diabetes mellitus with diabetic polyneuropathy: Secondary | ICD-10-CM | POA: Diagnosis not present

## 2015-03-13 DIAGNOSIS — E785 Hyperlipidemia, unspecified: Secondary | ICD-10-CM

## 2015-03-13 DIAGNOSIS — I10 Essential (primary) hypertension: Secondary | ICD-10-CM

## 2015-03-13 DIAGNOSIS — R0789 Other chest pain: Secondary | ICD-10-CM

## 2015-03-13 LAB — POCT GLYCOSYLATED HEMOGLOBIN (HGB A1C): Hemoglobin A1C: >14

## 2015-03-13 NOTE — Progress Notes (Signed)
Subjective:    Patient ID: Brad Mendoza, male    DOB: 1955/05/05, 60 y.o.   MRN: [id number]  HPI Pt here for follow up and management of chronic medical problems which includes diabetes. He is taking medications regularly.   he does have some burning chest pain usually after meals and not related to exertion. He takes generic Nexium twice a day.   since I saw him last he has been started on basal insulin as well as a sliding scale with Humalog preparation. Sugars have still been high but I get the sense that they have improved. His last A1c almost 1 year ago was 13.6.     Patient Active Problem List   Diagnosis Date Noted  . Type 2 diabetes mellitus with diabetic polyneuropathy ([place]) 12/16/2013  . CAD (coronary artery disease) 09/18/2013  . Heme positive stool 06/12/2013  . Change in stool 06/12/2013  . Seasonal allergic rhinitis 07/11/2012  . Polysubstance abuse 12/24/2010  . Bronchitis 12/24/2010  . Onychomycosis   . Acid reflux   . Nonischemic cardiomyopathy ([place])   . Type 2 diabetes mellitus with complications ([place])   . Hyperlipemia 11/16/2009  . Obesity 11/16/2009  . NONDEPENDENT TOBACCO USE DISORDER 11/16/2009  . Essential hypertension 11/16/2009  . CHEST PAIN UNSPECIFIED 11/16/2009   Outpatient Encounter Prescriptions as of 03/13/2015  Medication Sig  . amLODipine (NORVASC) 5 MG tablet Take 1 tablet (5 mg total) by mouth daily.  [name] aspirin EC 81 MG tablet Take 81 mg by mouth daily.  . Capsaicin (ICY HOT ARTHRITIS THERAPY EX) Apply 1 application topically 2 (two) times daily.  . carvedilol (COREG) 12.5 MG tablet Take 1 tablet (12.5 mg total) by mouth 2 (two) times daily with a meal.  . fluticasone (FLONASE) 50 MCG/ACT nasal spray USE 2 SPRAYS IN EACH NOSTRIL DAILY  . furosemide (LASIX) 40 MG tablet Take 1 tablet (40 mg total) by mouth daily.  [name] gabapentin (NEURONTIN) 300 MG capsule Take 1 capsule (300 mg total) by mouth 3 (three) times daily. (Patient taking  differently: Take 300 mg by mouth at bedtime. )  . glucose blood (ACCU-CHEK AVIVA PLUS) test strip Use as instructed (Patient taking differently: 1 each by Other route 3 (three) times daily. Use as instructed)  . HYDROcodone-acetaminophen (NORCO/VICODIN) 5-325 MG tablet Take one-two tabs po q 4-6 hrs prn pain  . hydrocortisone (PROCTOSOL HC) 2.5 % rectal cream Place 1 application rectally 2 (two) times daily.  . Insulin Detemir (LEVEMIR FLEXTOUCH) 100 UNIT/ML Pen Inject 10 to 20 units once a day  . insulin lispro (HUMALOG KWIKPEN) 100 UNIT/ML KiwkPen Inject up to 15 units prior to each meal and as needed for elevated BG  . Insulin Syringe-Needle U-100 (B-D INS SYRINGE 0.5CC/30GX1/2") 30G X 1/2" 0.5 ML MISC Use as directed  . isosorbide mononitrate (IMDUR) 60 MG 24 hr tablet Take 1.5 tablets (90 mg total) by mouth daily.  [name] lisinopril (PRINIVIL,ZESTRIL) 40 MG tablet Take 1 tablet (40 mg total) by mouth daily.  . metFORMIN (GLUCOPHAGE-XR) 500 MG 24 hr tablet TAKE 1 TABLET EVERY MORNING WITH BREAKFAST  . methocarbamol (ROBAXIN) 500 MG tablet Take 1 tablet (500 mg total) by mouth 3 (three) times daily.  . minocycline (MINOCIN,DYNACIN) 100 MG capsule Take 1 capsule (100 mg total) by mouth 2 (two) times daily.  . Multiple Vitamins-Minerals (CENTRUM SILVER ADULT 50+ PO) Take 1 tablet by mouth daily.  . naproxen (NAPROSYN) 500 MG tablet Take 1 tablet (500 mg total) by mouth  2 (two) times daily with a meal.  . NEXIUM 40 MG capsule TAKE 1 CAPSULE DAILY AT NOON  . nitroGLYCERIN (NITROSTAT) 0.4 MG SL tablet Place 1 tablet (0.4 mg total) under the tongue every 5 (five) minutes as needed.  . rosuvastatin (CRESTOR) 20 MG tablet Take 1 tablet (20 mg total) by mouth daily.  [name] UNIFINE PENTIPS 32G X 4 MM MISC 4 TIMES DAILY  . [DISCONTINUED] HYDROcodone-homatropine (HYCODAN) 5-1.5 MG/5ML syrup Take 5 mLs by mouth every 6 (six) hours as needed for cough.  . [DISCONTINUED] NEXIUM 40 MG capsule Take 40 mg by mouth  daily.   No facility-administered encounter medications on file as of 03/13/2015.      Review of Systems  Constitutional: Negative.   HENT: Positive for congestion.   Eyes: Negative.   Respiratory: Positive for cough.   Cardiovascular: Positive for chest pain (worse after meals).  Gastrointestinal: Negative.   Endocrine: Negative.   Genitourinary: Negative.   Musculoskeletal: Negative.   Skin: Negative.   Allergic/Immunologic: Negative.   Neurological: Negative.   Hematological: Negative.   Psychiatric/Behavioral: Negative.        Objective:   Physical Exam  Constitutional: He is oriented to person, place, and time. He appears well-developed and well-nourished.  Cardiovascular: Normal rate, regular rhythm and normal heart sounds.   EKG shows no acute process. There is one PVC and some nonspecific T-wave abnormalities.  Pulmonary/Chest: Effort normal and breath sounds normal.  Neurological: He is alert and oriented to person, place, and time.  Psychiatric: He has a normal mood and affect.    BP 150/84 mmHg  Pulse 88  Temp(Src) 99 F (37.2 C) (Oral)  Ht 5\' 9"  (1.753 m)  Wt 212 lb (96.163 kg)  BMI 31.29 kg/m2       Assessment & Plan:  1. Type 2 diabetes mellitus with diabetic polyneuropathy, unspecified long term insulin use status (HCC) Last A1c was almost a year ago and markedly elevated at 13.6 roughly we will see some improvement with institution of insulin - POCT glycosylated hemoglobin (Hb A1C)  2. Hyperlipemia Takes Crestor intermittently. Last A1c was 166, not at goal  3. Essential hypertension No acute change on EKG today. Chest pain is more likely GI related - EKG 12-Lead  4. Chest tightness As stated above normal EKG with the exception of minor T-wave changes. Would like to send to gastroenterology for evaluation of esophagus - EKG 12-Lead  [name] MD

## 2015-03-13 NOTE — Patient Instructions (Signed)
Continue current medications. Continue good therapeutic lifestyle changes which include good diet and exercise. Fall precautions discussed with patient. If an FOBT was given today- please return it to our front desk. If you are over 59 years old - you may need Prevnar 13 or the adult Pneumonia vaccine.  **Flu shots are available--- please call and schedule a FLU-CLINIC appointment**  After your visit with us today you will receive a survey in the mail or online from [employer] regarding your care with us. Please take a moment to fill this out. Your feedback is very important to us as you can help us better understand your patient needs as well as improve your experience and satisfaction. WE CARE ABOUT YOU!!!    

## 2015-03-23 ENCOUNTER — Other Ambulatory Visit (INDEPENDENT_AMBULATORY_CARE_PROVIDER_SITE_OTHER): Payer: Self-pay | Admitting: Internal Medicine

## 2015-03-24 ENCOUNTER — Encounter (INDEPENDENT_AMBULATORY_CARE_PROVIDER_SITE_OTHER): Payer: Self-pay | Admitting: *Deleted

## 2015-03-24 ENCOUNTER — Ambulatory Visit (INDEPENDENT_AMBULATORY_CARE_PROVIDER_SITE_OTHER): Payer: Medicare Other | Admitting: Internal Medicine

## 2015-03-24 ENCOUNTER — Encounter (INDEPENDENT_AMBULATORY_CARE_PROVIDER_SITE_OTHER): Payer: Self-pay | Admitting: Internal Medicine

## 2015-03-24 ENCOUNTER — Other Ambulatory Visit (INDEPENDENT_AMBULATORY_CARE_PROVIDER_SITE_OTHER): Payer: Self-pay | Admitting: Internal Medicine

## 2015-03-24 VITALS — BP 112/86 | HR 76 | Temp 98.0°F | Ht 69.0 in | Wt 213.8 lb

## 2015-03-24 DIAGNOSIS — K219 Gastro-esophageal reflux disease without esophagitis: Secondary | ICD-10-CM

## 2015-03-24 DIAGNOSIS — K5909 Other constipation: Secondary | ICD-10-CM

## 2015-03-24 NOTE — Progress Notes (Addendum)
Subjective:    Patient ID: Brad Mendoza, male    DOB: [birth year], 60 y.o.   MRN: [id number]  HPIReferred by Dr. Mick Sell for GERD/evaluation of his esophagus.  He tells me a couple of weeks ago he was burning in his chest. He had eaten some hot sauce and pork the night before he began to have the burning in his chest.  He tells me the burning has resolved. He says every now and then he has a sharp pain in his esophagus.  He has the burning in his chest back in December. He was seen at his PCP and his exam was normal. He says he started taking Nexium twice a day.  He says he still has some acid reflux and spitting up mucous. He has acid reflux 1-2 times a week. He does have some nausea at times. Nausea occurs sometimes in the morning.  His appetite is good. No weight loss since November. Weight has remained the same. He is eating 2 meals a day.  He usually has a BM daily.  No melena or BRRB.  Sometimes his stools are hard and he has to strain. Patient requests an EGD.  Diabetic since 2008. Last hemoglobin A1C was 14.        08/01/2013 Colonoscopy  Indications: Patient is 60 year old African male who was recently seen in office for intermittent rectal pain and noted to have heme positive stool. He is therefore undergoing diagnostic colonoscopy. His last colonoscopy was over 5 years ago and was reportedly normal. Examination performed to cecum. Scattered right-sided and sigmoid colon diverticula. Four small polyps removed as above and submitted together; two at hepatic flexure; one at proximal transverse colon and the fourth one at sigmoid colon. Small external hemorrhoids. No evidence of proctitis.  Patient had 4 small polyps removed and these are tubular adenomas Results given to patient Next colonoscopy in 5 years Patient will keep symptom diary as to frequency of rectal pain and return office visit in 3 months. Report to PCP    Review of Systems Past Medical  History  Diagnosis Date  . CHF (congestive heart failure) ([place])   . HTN (hypertension)   . Acid reflux   . Onychomycosis   . Back pain     f4 and f5  . Diverticulosis   . Hyperlipidemia   . Diabetes mellitus 2005  . H/O chest pain 2005    ECHO  . Hyperlipemia 11/16/2009    Qualifier: Diagnosis of  By: [name]      Past Surgical History  Procedure Laterality Date  . Appendectomy    . Colonoscopy N/A 08/01/2013    Procedure: COLONOSCOPY;  Surgeon: Rogene Houston, MD;  Location: [facility];  Service: Endoscopy;  Laterality: N/A;  rescheduled to [place] notified pt  . Cardiac catheterization  2005    4 frech cath    Allergies  Allergen Reactions  . Sulfa Antibiotics Shortness Of Breath  . Penicillins Anxiety    REACTION: rash    Current Outpatient Prescriptions on File Prior to Visit  Medication Sig Dispense Refill  . amLODipine (NORVASC) 5 MG tablet Take 1 tablet (5 mg total) by mouth daily. 90 tablet 3  . aspirin EC 81 MG tablet Take 81 mg by mouth daily.    . Capsaicin (ICY HOT ARTHRITIS THERAPY EX) Apply 1 application topically 2 (two) times daily.    . carvedilol (COREG) 12.5 MG tablet Take 1 tablet (12.5 mg total) by mouth  2 (two) times daily with a meal. 60 tablet 6  . fluticasone (FLONASE) 50 MCG/ACT nasal spray USE 2 SPRAYS IN EACH NOSTRIL DAILY 16 g 5  . furosemide (LASIX) 40 MG tablet Take 1 tablet (40 mg total) by mouth daily. 30 tablet 3  . gabapentin (NEURONTIN) 300 MG capsule Take 1 capsule (300 mg total) by mouth 3 (three) times daily. (Patient taking differently: Take 300 mg by mouth at bedtime. ) 90 capsule 0  . glucose blood (ACCU-CHEK AVIVA PLUS) test strip Use as instructed (Patient taking differently: 1 each by Other route 3 (three) times daily. Use as instructed) 100 each 0  . Insulin Detemir (LEVEMIR FLEXTOUCH) 100 UNIT/ML Pen Inject 10 to 20 units once a day 15 mL 1  . insulin lispro (HUMALOG KWIKPEN) 100 UNIT/ML KiwkPen Inject up to 15  units prior to each meal and as needed for elevated BG 15 mL 2  . Insulin Syringe-Needle U-100 (B-D INS SYRINGE 0.5CC/30GX1/2") 30G X 1/2" 0.5 ML MISC Use as directed 100 each 0  . isosorbide mononitrate (IMDUR) 60 MG 24 hr tablet Take 1.5 tablets (90 mg total) by mouth daily. 45 tablet 6  . lisinopril (PRINIVIL,ZESTRIL) 40 MG tablet Take 1 tablet (40 mg total) by mouth daily. 30 tablet 5  . metFORMIN (GLUCOPHAGE-XR) 500 MG 24 hr tablet TAKE 1 TABLET EVERY MORNING WITH BREAKFAST 30 tablet 2  . methocarbamol (ROBAXIN) 500 MG tablet Take 1 tablet (500 mg total) by mouth 3 (three) times daily. 21 tablet 0  . minocycline (MINOCIN,DYNACIN) 100 MG capsule Take 1 capsule (100 mg total) by mouth 2 (two) times daily. 10 capsule 0  . Multiple Vitamins-Minerals (CENTRUM SILVER ADULT 50+ PO) Take 1 tablet by mouth daily.    . naproxen (NAPROSYN) 500 MG tablet Take 1 tablet (500 mg total) by mouth 2 (two) times daily with a meal. 20 tablet 0  . NEXIUM 40 MG capsule TAKE 1 CAPSULE DAILY AT NOON 30 capsule 3  . nitroGLYCERIN (NITROSTAT) 0.4 MG SL tablet Place 1 tablet (0.4 mg total) under the tongue every 5 (five) minutes as needed. 30 tablet 1  . PROCTOZONE-HC 2.5 % rectal cream APPLY TWICE DAILY 30 g 2  . rosuvastatin (CRESTOR) 20 MG tablet Take 1 tablet (20 mg total) by mouth daily. 90 tablet 3  . HYDROcodone-acetaminophen (NORCO/VICODIN) 5-325 MG tablet Take one-two tabs po q 4-6 hrs prn pain (Patient not taking: Reported on 03/24/2015) 15 tablet 0  . UNIFINE PENTIPS 32G X 4 MM MISC 4 TIMES DAILY 100 each 1  . [DISCONTINUED] ranitidine (ZANTAC) 150 MG tablet Take 1 tablet (150 mg total) by mouth 2 (two) times daily. (Patient not taking: Reported on 11/24/2014) 90 tablet 1   No current facility-administered medications on file prior to visit.        Objective:   Physical Exam Blood pressure 112/86, pulse 76, temperature 98 F (36.7 C), height 5\' 9"  (1.753 m), weight 213 lb 12.8 oz (96.979 kg). Alert and  oriented. Skin warm and dry. Oral mucosa is moist.   . Sclera anicteric, conjunctivae is pink. Thyroid not enlarged. No cervical lymphadenopathy. Lungs clear. Heart regular rate and rhythm.  Abdomen is soft. Bowel sounds are positive. No hepatomegaly. No abdominal masses felt. Slight epigastric tenderness.  No edema to lower extremities.         Assessment & Plan:  GERD. He says the Nexium is working.  He still has break thru GERD. Patient is requesting an EGD. He  will continue the Nexium twice a day. The risks and benefits such as perforation, bleeding, and infection were reviewed with the patient and is agreeable. GERD diet given to patient.  Constipation: He will use a stool softener daily.

## 2015-03-24 NOTE — Patient Instructions (Addendum)
EGD. The risks and benefits such as perforation, bleeding, and infection were reviewed with the patient and is agreeable. Use a stool softener daily.

## 2015-04-17 ENCOUNTER — Encounter (HOSPITAL_COMMUNITY): Payer: Self-pay | Admitting: Emergency Medicine

## 2015-04-17 ENCOUNTER — Emergency Department (HOSPITAL_COMMUNITY)
Admission: EM | Admit: 2015-04-17 | Discharge: 2015-04-17 | Disposition: A | Discharge status: Home / Self Care | Payer: Medicare Other | Attending: Emergency Medicine | Admitting: Emergency Medicine

## 2015-04-17 ENCOUNTER — Emergency Department (HOSPITAL_COMMUNITY): Payer: Medicare Other

## 2015-04-17 DIAGNOSIS — Z794 Long term (current) use of insulin: Secondary | ICD-10-CM | POA: Insufficient documentation

## 2015-04-17 DIAGNOSIS — J90 Pleural effusion, not elsewhere classified: Secondary | ICD-10-CM | POA: Diagnosis not present

## 2015-04-17 DIAGNOSIS — Z79899 Other long term (current) drug therapy: Secondary | ICD-10-CM | POA: Insufficient documentation

## 2015-04-17 DIAGNOSIS — E785 Hyperlipidemia, unspecified: Secondary | ICD-10-CM | POA: Insufficient documentation

## 2015-04-17 DIAGNOSIS — I110 Hypertensive heart disease with heart failure: Secondary | ICD-10-CM | POA: Diagnosis not present

## 2015-04-17 DIAGNOSIS — E119 Type 2 diabetes mellitus without complications: Secondary | ICD-10-CM | POA: Insufficient documentation

## 2015-04-17 DIAGNOSIS — Z7982 Long term (current) use of aspirin: Secondary | ICD-10-CM | POA: Insufficient documentation

## 2015-04-17 DIAGNOSIS — Z9889 Other specified postprocedural states: Secondary | ICD-10-CM | POA: Diagnosis not present

## 2015-04-17 DIAGNOSIS — I10 Essential (primary) hypertension: Secondary | ICD-10-CM | POA: Insufficient documentation

## 2015-04-17 DIAGNOSIS — Z7984 Long term (current) use of oral hypoglycemic drugs: Secondary | ICD-10-CM | POA: Diagnosis not present

## 2015-04-17 DIAGNOSIS — Z7951 Long term (current) use of inhaled steroids: Secondary | ICD-10-CM | POA: Diagnosis not present

## 2015-04-17 DIAGNOSIS — I5021 Acute systolic (congestive) heart failure: Secondary | ICD-10-CM | POA: Insufficient documentation

## 2015-04-17 DIAGNOSIS — Z880 Allergy status to penicillin: Secondary | ICD-10-CM | POA: Diagnosis not present

## 2015-04-17 DIAGNOSIS — R0602 Shortness of breath: Secondary | ICD-10-CM | POA: Diagnosis present

## 2015-04-17 DIAGNOSIS — K219 Gastro-esophageal reflux disease without esophagitis: Secondary | ICD-10-CM | POA: Insufficient documentation

## 2015-04-17 DIAGNOSIS — Z8619 Personal history of other infectious and parasitic diseases: Secondary | ICD-10-CM | POA: Diagnosis not present

## 2015-04-17 LAB — CBC WITH DIFFERENTIAL/PLATELET
BASOS ABS: 0 10*3/uL (ref 0.0–0.1)
BASOS PCT: 1 %
Eosinophils Absolute: 0.3 10*3/uL (ref 0.0–0.7)
Eosinophils Relative: 5 %
HEMATOCRIT: 39.6 % (ref 39.0–52.0)
HEMOGLOBIN: 13.3 g/dL (ref 13.0–17.0)
LYMPHS ABS: 1.7 10*3/uL (ref 0.7–4.0)
Lymphocytes Relative: 30 %
MCH: 27 pg (ref 26.0–34.0)
MCHC: 33.6 g/dL (ref 30.0–36.0)
MCV: 80.5 fL (ref 78.0–100.0)
MONO ABS: 0.5 10*3/uL (ref 0.1–1.0)
Monocytes Relative: 9 %
NEUTROS PCT: 57 %
Neutro Abs: 3.2 10*3/uL (ref 1.7–7.7)
Platelets: 206 10*3/uL (ref 150–400)
RBC: 4.92 MIL/uL (ref 4.22–5.81)
RDW: 13.4 % (ref 11.5–15.5)
WBC: 5.7 10*3/uL (ref 4.0–10.5)

## 2015-04-17 LAB — BASIC METABOLIC PANEL
ANION GAP: 7 (ref 5–15)
BUN: 21 mg/dL — ABNORMAL HIGH (ref 6–20)
CALCIUM: 8.4 mg/dL — AB (ref 8.9–10.3)
CHLORIDE: 104 mmol/L (ref 101–111)
CO2: 26 mmol/L (ref 22–32)
Creatinine, Ser: 1.1 mg/dL (ref 0.61–1.24)
GFR calc non Af Amer: >60 mL/min (ref >60)
GLUCOSE: 387 mg/dL — AB (ref 65–99)
POTASSIUM: 4.4 mmol/L (ref 3.5–5.1)
Sodium: 137 mmol/L (ref 135–145)

## 2015-04-17 LAB — TROPONIN I: TROPONIN I: 0.04 ng/mL — AB (ref <0.031)

## 2015-04-17 LAB — BRAIN NATRIURETIC PEPTIDE: B Natriuretic Peptide: 130 pg/mL — ABNORMAL HIGH (ref 0.0–100.0)

## 2015-04-17 MED ORDER — POTASSIUM CHLORIDE CRYS ER 20 MEQ PO TBCR: 20.0000 meq | EXTENDED_RELEASE_TABLET | Freq: Every day | ORAL | Status: DC

## 2015-04-17 MED ORDER — FUROSEMIDE 10 MG/ML IJ SOLN
40.0000 mg | Freq: Once | INTRAMUSCULAR | Status: AC
Start: 2015-04-17 — End: 2015-04-17
  Administered 2015-04-17: 40 mg via INTRAVENOUS
  Filled 2015-04-17: qty 4

## 2015-04-17 NOTE — ED Provider Notes (Signed)
CSN: [id number]     Arrival date & time 04/17/15  1020 History   First MD Initiated Contact with Patient 04/17/15 1237     Chief Complaint  Patient presents with  . Shortness of Breath     (Consider location/radiation/quality/duration/timing/severity/associated sxs/prior Treatment) Patient is a 60 y.o. male presenting with shortness of breath. The history is provided by the patient (Patient complains of shortness of breath when he lies down. He is also had minimal chest discomfort this started a week ago noted pain in his chest in the last day).  Shortness of Breath Severity:  Mild Onset quality:  Sudden Timing:  Constant Progression:  Waxing and waning Chronicity:  New Context: activity   Associated symptoms: no abdominal pain, no chest pain, no cough, no headaches and no rash     Past Medical History  Diagnosis Date  . CHF (congestive heart failure) ([place])   . HTN (hypertension)   . Acid reflux   . Onychomycosis   . Back pain     f4 and f5  . Diverticulosis   . Hyperlipidemia   . Diabetes mellitus 2005  . H/O chest pain 2005    ECHO  . Hyperlipemia 11/16/2009    Qualifier: Diagnosis of  By: [name]     Past Surgical History  Procedure Laterality Date  . Appendectomy    . Colonoscopy N/A 08/01/2013    Procedure: COLONOSCOPY;  Surgeon: Rogene Houston, MD;  Location[facility];  Service: Endoscopy;  Laterality: N/A;  rescheduled to [place] notified pt  . Cardiac catheterization  2005    4 frech cath   Family History  Problem Relation Age of Onset  . Hypertension Father   . Diabetes Father   . Cancer Mother   . Alcohol abuse Brother   . Colon cancer Neg Hx    Social History  Substance Use Topics  . Smoking status: Never Smoker   . Smokeless tobacco: Current User    Types: Snuff     Comment: dips snuff about 6 cans/week for 3 years  . Alcohol Use: 0.6 oz/week    1 Cans of beer per week     Comment: occ     Review of Systems  Constitutional:  Negative for appetite change and fatigue.  HENT: Negative for congestion, ear discharge and sinus pressure.   Eyes: Negative for discharge.  Respiratory: Positive for shortness of breath. Negative for cough.   Cardiovascular: Negative for chest pain.  Gastrointestinal: Negative for abdominal pain and diarrhea.  Genitourinary: Negative for frequency and hematuria.  Musculoskeletal: Negative for back pain.  Skin: Negative for rash.  Neurological: Negative for seizures and headaches.  Psychiatric/Behavioral: Negative for hallucinations.      Allergies  Sulfa antibiotics and Penicillins  Home Medications   Prior to Admission medications   Medication Sig Start Date End Date Taking? Authorizing Provider  amLODipine (NORVASC) 5 MG tablet Take 1 tablet (5 mg total) by mouth daily. 01/01/14  Yes Herminio Commons, MD  aspirin EC 81 MG tablet Take 81 mg by mouth daily.   Yes Historical Provider, MD  Capsaicin (ICY HOT ARTHRITIS THERAPY EX) Apply 1 application topically 2 (two) times daily.   Yes Historical Provider, MD  carvedilol (COREG) 12.5 MG tablet Take 1 tablet (12.5 mg total) by mouth 2 (two) times daily with a meal. 12/23/14  Yes Herminio Commons, MD  fluticasone (FLONASE) 50 MCG/ACT nasal spray USE 2 SPRAYS IN EACH NOSTRIL DAILY 12/25/13  Yes Mary-Margaret Hassell Done, FNP  furosemide (LASIX) 40 MG tablet Take 1 tablet (40 mg total) by mouth daily. 01/31/13  Yes Vernie Shanks, MD  gabapentin (NEURONTIN) 300 MG capsule Take 1 capsule (300 mg total) by mouth 3 (three) times daily. Patient taking differently: Take 300 mg by mouth at bedtime.  06/24/14  Yes [name], MD  Insulin Detemir (LEVEMIR FLEXTOUCH) 100 UNIT/ML Pen Inject 10 to 20 units once a day Patient taking differently: Inject 10-20 Units into the skin at bedtime. Inject 10 to 20 units once a day 11/07/14  Yes Tammy Eckard, PHARMD  insulin lispro (HUMALOG KWIKPEN) 100 UNIT/ML KiwkPen Inject up to 15 units prior to each meal  and as needed for elevated BG 07/03/14  Yes Tammy Eckard, PHARMD  isosorbide mononitrate (IMDUR) 60 MG 24 hr tablet Take 1.5 tablets (90 mg total) by mouth daily. 07/11/14  Yes Herminio Commons, MD  lisinopril (PRINIVIL,ZESTRIL) 40 MG tablet Take 1 tablet (40 mg total) by mouth daily. 11/09/12  Yes Vernie Shanks, MD  metFORMIN (GLUCOPHAGE-XR) 500 MG 24 hr tablet TAKE 1 TABLET EVERY MORNING WITH BREAKFAST 12/23/14  Yes Tammy Eckard, PHARMD  Multiple Vitamins-Minerals (CENTRUM SILVER ADULT 50+ PO) Take 1 tablet by mouth daily.   Yes Historical Provider, MD  NEXIUM 40 MG capsule TAKE 1 CAPSULE DAILY AT NOON 12/23/14  Yes [name], MD  rosuvastatin (CRESTOR) 20 MG tablet Take 1 tablet (20 mg total) by mouth daily. Patient taking differently: Take 20 mg by mouth daily as needed (cholesterol).  01/01/14  Yes Herminio Commons, MD  glucose blood (ACCU-CHEK AVIVA PLUS) test strip Use as instructed Patient taking differently: 1 each by Other route 3 (three) times daily. Use as instructed 05/17/12   Gearlean Alf, PA-C  Insulin Syringe-Needle U-100 (B-D INS SYRINGE 0.5CC/30GX1/2") 30G X 1/2" 0.5 ML MISC Use as directed 01/31/13   Vernie Shanks, MD  methocarbamol (ROBAXIN) 500 MG tablet Take 1 tablet (500 mg total) by mouth 3 (three) times daily. Patient not taking: Reported on 04/17/2015 12/30/14   Tammy Triplett, PA-C  naproxen (NAPROSYN) 500 MG tablet Take 1 tablet (500 mg total) by mouth 2 (two) times daily with a meal. Patient not taking: Reported on 04/17/2015 12/30/14   Tammy Triplett, PA-C  nitroGLYCERIN (NITROSTAT) 0.4 MG SL tablet Place 1 tablet (0.4 mg total) under the tongue every 5 (five) minutes as needed. 05/10/13   Vernie Shanks, MD  potassium chloride SA (K-DUR,KLOR-CON) 20 MEQ tablet Take 1 tablet (20 mEq total) by mouth daily. 04/17/15   Milton Ferguson, MD  PROCTOZONE-HC 2.5 % rectal cream APPLY TWICE DAILY 03/23/15   [name], NP  UNIFINE PENTIPS 32G X 4 MM MISC 4 TIMES DAILY  09/26/14   [name], MD   BP 161/108 mmHg  Pulse 80  Temp(Src) 98.9 F (37.2 C) (Oral)  Resp 20  SpO2 99% Physical Exam  Constitutional: He is oriented to person, place, and time. He appears well-developed.  HENT:  Head: Normocephalic.  Eyes: Conjunctivae and EOM are normal. No scleral icterus.  Neck: Neck supple. No thyromegaly present.  Cardiovascular: Normal rate and regular rhythm.  Exam reveals no gallop and no friction rub.   No murmur heard. Pulmonary/Chest: No stridor. He has no wheezes. He has no rales. He exhibits no tenderness.  Abdominal: He exhibits no distension. There is no tenderness. There is no rebound.  Musculoskeletal: Normal range of motion. He exhibits no edema.  Lymphadenopathy:    He has no cervical adenopathy.  Neurological: He is oriented to person, place, and time. He exhibits normal muscle tone. Coordination normal.  Skin: No rash noted. No erythema.  Psychiatric: He has a normal mood and affect. His behavior is normal.    ED Course  Procedures (including critical care time) Labs Review Labs Reviewed  BASIC METABOLIC PANEL - Abnormal; Notable for the following:    Glucose, Bld 387 (*)    BUN 21 (*)    Calcium 8.4 (*)    All other components within normal limits  TROPONIN I - Abnormal; Notable for the following:    Troponin I 0.04 (*)    All other components within normal limits  BRAIN NATRIURETIC PEPTIDE - Abnormal; Notable for the following:    B Natriuretic Peptide 130.0 (*)    All other components within normal limits  CBC WITH DIFFERENTIAL/PLATELET    Imaging Review Dg Chest 2 View  04/17/2015  CLINICAL DATA:  SOB X 2 WEEKS, WORSE AT NIGHT AFTER EATING SUPPER AND LAYING DOWN, WORSENING, HTN, NON SMOKER EXAM: CHEST - 2 VIEW COMPARISON:  11/25/2014 FINDINGS: Small pleural effusions right greater than left, new since previous exam. Adjacent subsegmental atelectasis or infiltrate in the lung bases right greater than left. Mild  cardiomegaly. Mild perihilar and bibasilar interstitial prominence. No pneumothorax. Visualized skeletal structures are unremarkable. IMPRESSION: 1. Mild cardiomegaly with small pleural effusions, perihilar and bibasilar interstitial prominence suggesting early CHF. Electronically Signed   By: Lucrezia Europe M.D.   On: 04/17/2015 10:39   I have personally reviewed and evaluated these images and lab results as part of my medical decision-making.   EKG Interpretation   Date/Time:  Friday April 17 2015 12:58:21 EST Ventricular Rate:  89 PR Interval:  172 QRS Duration: 90 QT Interval:  401 QTC Calculation: 488 R Axis:   -2 Text Interpretation:  Sinus rhythm Borderline T abnormalities, inferior  leads Borderline prolonged QT interval Baseline wander in lead(s) II III  aVF V2 Confirmed by Kalon Erhardt  MD, Yicel Shannon [phone]) on 04/17/2015 3:10:32 PM      MDM   Final diagnoses:  CHF (congestive heart failure), NYHA class I, acute, systolic (HCC)    Patient in mild congestive heart failure he was given 40 of Lasix and urinated about 500 mL. I spoke to cardiology, Dr.Krishnan and he stated to increase the Norvasc to 10 mg a day also increase the Lasix to twice a day for 3 days. Patient will also be given some potassium for 3 days and will follow-up with cards next week    Milton Ferguson, MD 04/17/15 1536

## 2015-04-17 NOTE — ED Notes (Signed)
Patient is resting comfortably. 

## 2015-04-17 NOTE — ED Notes (Signed)
C/o SOB since last night when laying flat at night.  C/o lower back pain, rates pain 8/10.  C/o burning to mid chest.

## 2015-04-17 NOTE — ED Notes (Signed)
Watching TV, NAD, reports that he wonders how much longer he will be here- active listening and verbal reassurrances

## 2015-04-17 NOTE — Discharge Instructions (Signed)
Increase lasix to 40mg  twice a day for 3 days.   Increase norvasc to 10 mg a day.  Follow up with your heart md next week.

## 2015-04-17 NOTE — ED Notes (Signed)
Cardiology paged to [phone]

## 2015-04-17 NOTE — ED Notes (Signed)
Physician in to reassess

## 2015-04-23 ENCOUNTER — Encounter (HOSPITAL_COMMUNITY)
Admission: RE | Disposition: A | Discharge status: Home / Self Care | Payer: Self-pay | Source: Ambulatory Visit | Attending: Internal Medicine

## 2015-04-23 ENCOUNTER — Ambulatory Visit (HOSPITAL_COMMUNITY)
Admission: RE | Admit: 2015-04-23 | Discharge: 2015-04-23 | Disposition: A | Discharge status: Home / Self Care | Payer: Medicare Other | Source: Ambulatory Visit | Attending: Internal Medicine | Admitting: Internal Medicine

## 2015-04-23 ENCOUNTER — Encounter (HOSPITAL_COMMUNITY): Payer: Self-pay

## 2015-04-23 DIAGNOSIS — I110 Hypertensive heart disease with heart failure: Secondary | ICD-10-CM | POA: Insufficient documentation

## 2015-04-23 DIAGNOSIS — Z720 Tobacco use: Secondary | ICD-10-CM | POA: Insufficient documentation

## 2015-04-23 DIAGNOSIS — K219 Gastro-esophageal reflux disease without esophagitis: Secondary | ICD-10-CM | POA: Diagnosis not present

## 2015-04-23 DIAGNOSIS — K2950 Unspecified chronic gastritis without bleeding: Secondary | ICD-10-CM | POA: Diagnosis not present

## 2015-04-23 DIAGNOSIS — K3189 Other diseases of stomach and duodenum: Secondary | ICD-10-CM | POA: Diagnosis not present

## 2015-04-23 DIAGNOSIS — E119 Type 2 diabetes mellitus without complications: Secondary | ICD-10-CM | POA: Insufficient documentation

## 2015-04-23 DIAGNOSIS — Z79899 Other long term (current) drug therapy: Secondary | ICD-10-CM | POA: Insufficient documentation

## 2015-04-23 DIAGNOSIS — K449 Diaphragmatic hernia without obstruction or gangrene: Secondary | ICD-10-CM | POA: Insufficient documentation

## 2015-04-23 DIAGNOSIS — K229 Disease of esophagus, unspecified: Secondary | ICD-10-CM | POA: Diagnosis not present

## 2015-04-23 DIAGNOSIS — Z7951 Long term (current) use of inhaled steroids: Secondary | ICD-10-CM | POA: Diagnosis not present

## 2015-04-23 DIAGNOSIS — E785 Hyperlipidemia, unspecified: Secondary | ICD-10-CM | POA: Diagnosis not present

## 2015-04-23 DIAGNOSIS — Z794 Long term (current) use of insulin: Secondary | ICD-10-CM | POA: Diagnosis not present

## 2015-04-23 HISTORY — PX: ESOPHAGOGASTRODUODENOSCOPY: SHX5428

## 2015-04-23 LAB — GLUCOSE, CAPILLARY
GLUCOSE-CAPILLARY: 314 mg/dL — AB (ref 65–99)
Glucose-Capillary: 305 mg/dL — ABNORMAL HIGH (ref 65–99)
Glucose-Capillary: 211 mg/dL — ABNORMAL HIGH (ref 65–99)

## 2015-04-23 SURGERY — EGD (ESOPHAGOGASTRODUODENOSCOPY)
Anesthesia: Moderate Sedation

## 2015-04-23 MED ORDER — STERILE WATER FOR IRRIGATION IR SOLN
Status: DC | PRN
  Administered 2015-04-23: 4 mL

## 2015-04-23 MED ORDER — MEPERIDINE HCL 50 MG/ML IJ SOLN
INTRAMUSCULAR | Status: AC
  Filled 2015-04-23: qty 1

## 2015-04-23 MED ORDER — METOCLOPRAMIDE HCL 10 MG PO TABS: 10.0000 mg | ORAL_TABLET | Freq: Three times a day (TID) | ORAL | Status: DC

## 2015-04-23 MED ORDER — INSULIN ASPART 100 UNIT/ML ~~LOC~~ SOLN
4.0000 [IU] | Freq: Once | SUBCUTANEOUS | Status: AC
  Administered 2015-04-23: 4 [IU] via SUBCUTANEOUS
  Filled 2015-04-23: qty 0.04

## 2015-04-23 MED ORDER — INSULIN ASPART 100 UNIT/ML ~~LOC~~ SOLN
6.0000 [IU] | Freq: Once | SUBCUTANEOUS | Status: AC
  Administered 2015-04-23: 6 [IU] via SUBCUTANEOUS
  Filled 2015-04-23: qty 0.06

## 2015-04-23 MED ORDER — MEPERIDINE HCL 50 MG/ML IJ SOLN
INTRAMUSCULAR | Status: DC | PRN
  Administered 2015-04-23 (×2): 25 mg via INTRAVENOUS

## 2015-04-23 MED ORDER — MIDAZOLAM HCL 5 MG/5ML IJ SOLN
INTRAMUSCULAR | Status: DC | PRN
  Administered 2015-04-23: 1 mg via INTRAVENOUS
  Administered 2015-04-23 (×2): 2 mg via INTRAVENOUS

## 2015-04-23 MED ORDER — MIDAZOLAM HCL 5 MG/5ML IJ SOLN
INTRAMUSCULAR | Status: AC
  Filled 2015-04-23: qty 10

## 2015-04-23 MED ORDER — SODIUM CHLORIDE 0.9 % IV SOLN
INTRAVENOUS | Status: DC
  Administered 2015-04-23: 13:00:00 via INTRAVENOUS

## 2015-04-23 MED ORDER — BUTAMBEN-TETRACAINE-BENZOCAINE 2-2-14 % EX AERO
INHALATION_SPRAY | CUTANEOUS | Status: DC | PRN
  Administered 2015-04-23: 2 via TOPICAL

## 2015-04-23 NOTE — H&P (Signed)
Brad Mendoza is an 60 y.o. male.   Chief Complaint: Patient is here for EGD. HPI: patient is 60 year old African American with multiple medical problems including diabetes mellitus was at 7 year history of gastroesophageal reflux disease. Treatment is not working anymore. He presents with frequent heartburn regurgitation therapy when he lies flat. He also has feeling of sore throat and lump in his throat. He denies vomiting dysphagia melena or rectal bleeding. He states he is not taking naproxen.  Past Medical History  Diagnosis Date  . CHF (congestive heart failure) ([place])   . HTN (hypertension)   . Acid reflux   . Onychomycosis   . Back pain     f4 and f5  . Diverticulosis   . Hyperlipidemia   . Diabetes mellitus 2005  . H/O chest pain 2005    ECHO  . Hyperlipemia 11/16/2009    Qualifier: Diagnosis of  By: [name]      Past Surgical History  Procedure Laterality Date  . Appendectomy    . Colonoscopy N/A 08/01/2013    Procedure: COLONOSCOPY;  Surgeon: Rogene Houston, MD;  [facility];  Service: Endoscopy;  Laterality: N/A;  rescheduled to [place] notified pt  . Cardiac catheterization  2005    4 frech cath    Family History  Problem Relation Age of Onset  . Hypertension Father   . Diabetes Father   . Cancer Mother   . Alcohol abuse Brother   . Colon cancer Neg Hx    Social History:  reports that he has never smoked. His smokeless tobacco use includes Snuff. He reports that he drinks about 0.6 oz of alcohol per week. He reports that he uses illicit drugs (Marijuana).  Allergies:  Allergies  Allergen Reactions  . Sulfa Antibiotics Shortness Of Breath  . Penicillins Rash    Has patient had a PCN reaction causing immediate rash, facial/tongue/throat swelling, SOB or lightheadedness with hypotension: No Has patient had a PCN reaction causing severe rash involving mucus membranes or skin necrosis: No Has patient had a PCN reaction that required  hospitalization No Has patient had a PCN reaction occurring within the last 10 years: No If all of the above answers are "NO", then may proceed with Cephalosporin use.     Medications Prior to Admission  Medication Sig Dispense Refill  . amLODipine (NORVASC) 5 MG tablet Take 1 tablet (5 mg total) by mouth daily. 90 tablet 3  . aspirin EC 81 MG tablet Take 81 mg by mouth daily.    . Capsaicin (ICY HOT ARTHRITIS THERAPY EX) Apply 1 application topically 2 (two) times daily.    . carvedilol (COREG) 12.5 MG tablet Take 1 tablet (12.5 mg total) by mouth 2 (two) times daily with a meal. 60 tablet 6  . fluticasone (FLONASE) 50 MCG/ACT nasal spray USE 2 SPRAYS IN EACH NOSTRIL DAILY 16 g 5  . furosemide (LASIX) 40 MG tablet Take 1 tablet (40 mg total) by mouth daily. 30 tablet 3  . gabapentin (NEURONTIN) 300 MG capsule Take 1 capsule (300 mg total) by mouth 3 (three) times daily. (Patient taking differently: Take 300 mg by mouth at bedtime. ) 90 capsule 0  . glucose blood (ACCU-CHEK AVIVA PLUS) test strip Use as instructed (Patient taking differently: 1 each by Other route 3 (three) times daily. Use as instructed) 100 each 0  . Insulin Detemir (LEVEMIR FLEXTOUCH) 100 UNIT/ML Pen Inject 10 to 20 units once a day (Patient taking differently:  Inject 10-20 Units into the skin at bedtime. Inject 10 to 20 units once a day) 15 mL 1  . insulin lispro (HUMALOG KWIKPEN) 100 UNIT/ML KiwkPen Inject up to 15 units prior to each meal and as needed for elevated BG 15 mL 2  . Insulin Syringe-Needle U-100 (B-D INS SYRINGE 0.5CC/30GX1/2") 30G X 1/2" 0.5 ML MISC Use as directed 100 each 0  . isosorbide mononitrate (IMDUR) 60 MG 24 hr tablet Take 1.5 tablets (90 mg total) by mouth daily. 45 tablet 6  . lisinopril (PRINIVIL,ZESTRIL) 40 MG tablet Take 1 tablet (40 mg total) by mouth daily. 30 tablet 5  . metFORMIN (GLUCOPHAGE-XR) 500 MG 24 hr tablet TAKE 1 TABLET EVERY MORNING WITH BREAKFAST 30 tablet 2  . methocarbamol  (ROBAXIN) 500 MG tablet Take 1 tablet (500 mg total) by mouth 3 (three) times daily. 21 tablet 0  . Multiple Vitamins-Minerals (CENTRUM SILVER ADULT 50+ PO) Take 1 tablet by mouth daily.    . naproxen (NAPROSYN) 500 MG tablet Take 1 tablet (500 mg total) by mouth 2 (two) times daily with a meal. 20 tablet 0  . NEXIUM 40 MG capsule TAKE 1 CAPSULE DAILY AT NOON 30 capsule 3  . nitroGLYCERIN (NITROSTAT) 0.4 MG SL tablet Place 1 tablet (0.4 mg total) under the tongue every 5 (five) minutes as needed. 30 tablet 1  . potassium chloride SA (K-DUR,KLOR-CON) 20 MEQ tablet Take 1 tablet (20 mEq total) by mouth daily. 3 tablet 0  . PROCTOZONE-HC 2.5 % rectal cream APPLY TWICE DAILY 30 g 2  . rosuvastatin (CRESTOR) 20 MG tablet Take 1 tablet (20 mg total) by mouth daily. (Patient taking differently: Take 20 mg by mouth daily as needed (cholesterol). ) 90 tablet 3  . UNIFINE PENTIPS 32G X 4 MM MISC 4 TIMES DAILY 100 each 1    Results for orders placed or performed during the hospital encounter of 04/23/15 (from the past 48 hour(s))  Glucose, capillary     Status: Abnormal   Collection Time: 04/23/15 12:40 PM  Result Value Ref Range   Glucose-Capillary 314 (H) 65 - 99 mg/dL  Glucose, capillary     Status: Abnormal   Collection Time: 04/23/15  1:36 PM  Result Value Ref Range   Glucose-Capillary 305 (H) 65 - 99 mg/dL   No results found.  ROS  Blood pressure 155/100, pulse 87, temperature 98.2 F (36.8 C), temperature source Oral, resp. rate 22, height 5\' 9"  (1.753 m), weight 220 lb (99.791 kg), SpO2 100 %. Physical Exam  Constitutional: He appears well-developed and well-nourished.  HENT:  Mouth/Throat: Oropharynx is clear and moist.  Eyes: Conjunctivae are normal. No scleral icterus.  Neck: No thyromegaly present.  Cardiovascular: Normal rate, regular rhythm and normal heart sounds.   No murmur heard. Respiratory: Effort normal and breath sounds normal.  GI: Soft. He exhibits no distension and  no mass. There is no tenderness.  Musculoskeletal: He exhibits no edema.  Lymphadenopathy:    He has no cervical adenopathy.  Neurological: He is alert.  Skin: Skin is warm and dry.     Assessment/Plan Poorly controlled gastroesophageal reflux disease. Diagnostic EGD.  Rogene Houston, MD 04/23/2015, 2:26 PM

## 2015-04-23 NOTE — Op Note (Signed)
EGD PROCEDURE REPORT  PATIENT:  Brad Mendoza  MR#:  [id number] Birthdate:  [birth year], 60 y.o., male Endoscopist:  Dr. Rogene Houston, MD Referred By:  Dr. Lillette Boxer. Sabra Heck, MD  Procedure Date: 04/23/2015  Procedure:   EGD  Indications:   Patient is 60 year old Caucasian male with several year history of gastroesophageal reflux disease who is having frequent heartburn regurgitation as well as lump in his throat. He has diabetic and control is suboptimal. Naproxen is one of [place] listed medications but he states he is not taking it. He is undergoing diagnostic EGD.           Informed Consent:  The risks, benefits, alternatives & imponderables which include, but are not limited to, bleeding, infection, perforation, drug reaction and potential missed lesion have been reviewed.  The potential for biopsy, lesion removal, esophageal dilation, etc. have also been discussed.  Questions have been answered.  All parties agreeable.  Please see history & physical in medical record for more information.  Medications:  Demerol 50 mg IV Versed 5 mg IV Cetacaine spray topically for oropharyngeal anesthesia  First dose administered at 1431 Last dose administered at  1435  Description of procedure:  The endoscope was introduced through the mouth and advanced to the second portion of the duodenum without difficulty or limitations. The mucosal surfaces were surveyed very carefully during advancement of the scope and upon withdrawal.  Findings:  Esophagus:  Mucosa of the esophagus was normal except small patch of erythema with yellowish tinge. This area was difficult to assess because of constant motion. No erosions or ulcers noted. GEJ:  40 cm Hiatus:  42 cm Stomach:  Stomach was empty and distended very well with insufflation. Folds in the proximal stomach were normal. Examination mucosa at gastric body revealed patchy erythema with mosaic pattern. Antral mucosa was normal. Pyloric channel was patent.  Angularis fundus and cardia were unremarkable. Duodenum:  Bulbar and post bulbar mucosa.  Therapeutic/Diagnostic Maneuvers Performed:   Biopsies taken from gastric mucosa and body for routine histology.  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 Small sliding hiatal hernia without evidence of erosive esophagitis. Mild portal gastropathy. Biopsies taken.  Comment: Patient's ongoing symptoms are possibly due to diabetic gastroparesis. Consider upper abdominal ultrasound unless she has had one done by Dr. Sabra Heck. Will check with his office first.  Recommendations:  Standard instructions given. Anti-reflux measures reinforced(patient is not watching diet). Patient advised not to take naproxen. Metoclopramide 10 mg by mouth before meals. Patient will stop this medication should he experience any side effects. Will wait for results of gastric biopsy before scheduling solid-phase gastric emptying study.   Davanee Klinkner U  04/23/2015  3:00 PM  CC: Dr. [name], MD & Dr. Rayne Du ref. provider found

## 2015-04-23 NOTE — Discharge Instructions (Signed)
Resume usual medications and diet. Metoclopramide 10 mg by mouth 30 minutes before each meal daily.Stop this medication if you experience any side effects. No driving for 24 hours. Physician will call with biopsy results and further recommendations.    Esophagogastroduodenoscopy Esophagogastroduodenoscopy (EGD) is a procedure that is used to examine the lining of the esophagus, stomach, and first part of the small intestine (duodenum). A long, flexible, lighted tube with a camera attached (endoscope) is inserted down the throat to view these organs. This procedure is done to detect problems or abnormalities, such as inflammation, bleeding, ulcers, or growths, in order to treat them. The procedure lasts 5-20 minutes. It is usually an outpatient procedure, but it may need to be performed in a hospital in emergency cases. LET [facility] CARE PROVIDER KNOW ABOUT:  Any allergies you have.  All medicines you are taking, including vitamins, herbs, eye drops, creams, and over-the-counter medicines.  Previous problems you or members of your family have had with the use of anesthetics.  Any blood disorders you have.  Previous surgeries you have had.  Medical conditions you have. RISKS AND COMPLICATIONS Generally, this is a safe procedure. However, problems can occur and include:  Infection.  Bleeding.  Tearing (perforation) of the esophagus, stomach, or duodenum.  Difficulty breathing or not being able to breathe.  Excessive sweating.  Spasms of the larynx.  Slowed heartbeat.  Low blood pressure. BEFORE THE PROCEDURE  Do not eat or drink anything after midnight on the night before the procedure or as directed by your health care provider.  Do not take your regular medicines before the procedure if your health care provider asks you not to. Ask your health care provider about changing or stopping those medicines.  If you wear dentures, be prepared to remove them before the  procedure.  Arrange for someone to drive you home after the procedure. PROCEDURE  A numbing medicine (local anesthetic) may be sprayed in your throat for comfort and to stop you from gagging or coughing.  You will have an IV tube inserted in a vein in your hand or arm. You will receive medicines and fluids through this tube.  You will be given a medicine to relax you (sedative).  A pain reliever will be given through the IV tube.  A mouth guard may be placed in your mouth to protect your teeth and to keep you from biting on the endoscope.  You will be asked to lie on your left side.  The endoscope will be inserted down your throat and into your esophagus, stomach, and duodenum.  Air will be put through the endoscope to allow your health care provider to clearly view the lining of your esophagus.  The lining of your esophagus, stomach, and duodenum will be examined. During the exam, your health care provider may:  Remove tissue to be examined under a microscope (biopsy) for inflammation, infection, or other medical problems.  Remove growths.  Remove objects (foreign bodies) that are stuck.  Treat any bleeding with medicines or other devices that stop tissues from bleeding (hot cautery, clipping devices).  Widen (dilate) or stretch narrowed areas of your esophagus and stomach.  The endoscope will be withdrawn. AFTER THE PROCEDURE  You will be taken to a recovery area for observation. Your blood pressure, heart rate, breathing rate, and blood oxygen level will be monitored often until the medicines you were given have worn off.  Do not eat or drink anything until the numbing medicine has worn  off and your gag reflex has returned. You may choke.  Your health care provider should be able to discuss his or her findings with you. It will take longer to discuss the test results if any biopsies were taken.   This information is not intended to replace advice given to you by your  health care provider. Make sure you discuss any questions you have with your health care provider.   Document Released: 06/10/2004 Document Revised: 02/28/2014 Document Reviewed: 01/11/2012 Elsevier Interactive Patient Education [employer].

## 2015-04-24 ENCOUNTER — Other Ambulatory Visit (INDEPENDENT_AMBULATORY_CARE_PROVIDER_SITE_OTHER): Payer: Self-pay | Admitting: Internal Medicine

## 2015-04-24 DIAGNOSIS — IMO0001 Reserved for inherently not codable concepts without codable children: Secondary | ICD-10-CM

## 2015-04-24 DIAGNOSIS — R1110 Vomiting, unspecified: Secondary | ICD-10-CM

## 2015-04-24 DIAGNOSIS — R12 Heartburn: Secondary | ICD-10-CM

## 2015-04-28 ENCOUNTER — Encounter: Payer: Self-pay | Admitting: Cardiovascular Disease

## 2015-04-28 ENCOUNTER — Ambulatory Visit (INDEPENDENT_AMBULATORY_CARE_PROVIDER_SITE_OTHER): Payer: Medicare Other | Admitting: Cardiovascular Disease

## 2015-04-28 VITALS — BP 140/88 | HR 87 | Ht 69.0 in | Wt 217.0 lb

## 2015-04-28 DIAGNOSIS — E785 Hyperlipidemia, unspecified: Secondary | ICD-10-CM

## 2015-04-28 DIAGNOSIS — I25118 Atherosclerotic heart disease of native coronary artery with other forms of angina pectoris: Secondary | ICD-10-CM | POA: Diagnosis not present

## 2015-04-28 DIAGNOSIS — I10 Essential (primary) hypertension: Secondary | ICD-10-CM

## 2015-04-28 DIAGNOSIS — I5023 Acute on chronic systolic (congestive) heart failure: Secondary | ICD-10-CM

## 2015-04-28 MED ORDER — AMLODIPINE BESYLATE 10 MG PO TABS: 10.0000 mg | ORAL_TABLET | Freq: Every day | ORAL | Status: DC

## 2015-04-28 MED ORDER — FUROSEMIDE 40 MG PO TABS: 40.0000 mg | ORAL_TABLET | Freq: Two times a day (BID) | ORAL | Status: DC

## 2015-04-28 MED ORDER — ROSUVASTATIN CALCIUM 10 MG PO TABS: 10.0000 mg | ORAL_TABLET | Freq: Every day | ORAL | Status: DC

## 2015-04-28 NOTE — Patient Instructions (Addendum)
   Increase Amlodipine to 10mg  daily.  Increase Lasix to 40mg  twice a day  X 3 days only, then resume previous dose of one tab daily.  Decrease Crestor to 10mg  daily.  New prescriptions sent to [facility] today. Continue all other medications.   Follow up in  2 months

## 2015-04-28 NOTE — Progress Notes (Signed)
Patient ID: KARDIN KRAVEC, male   DOB: Jan 19, 1956, 60 y.o.   MRN: [id number]      SUBJECTIVE: The patient is here for routine follow-up for chronic systolic heart failure, coronary artery disease, hypertension and hyperlipidemia. He has a history of a nonischemic cardiomyopathy. In 2005, a 50% LAD lesion was noted. Nuclear stress testing in October 2014 demonstrated anterior wall and anteroseptal infarcts with no suggestion of ischemia.  Echocardiogram on 07/17/14 demonstrated mild to moderately reduced left ventricular systolic function, EF [id number], with grade 1 diastolic dysfunction, mild left atrial enlargement. Nuclear stress testing in October 2014 demonstrated anterior wall and anteroseptal infarcts with no suggestion of ischemia.   He was evaluated in the ED on 04/17/15 for shortness of breath. He also complained of minimal chest discomfort. BNP 130, troponin 0.04. He was noted to be in mild congestive heart failure and was given 40 mg of Lasix. Blood pressure was 161/108 and amlodipine was increased to 10 mg daily and Lasix dosage was increased to twice daily for 3 days.  He did not increase the amlodipine as instructed. He still feels short of breath and wheezes when he lies down. Labs on 2/24 showed BUN 21, creatinine 1.1. He has used sublingual nitroglycerin twice in February. He does not have consistent exertional chest tightness.   Review of Systems: As per "subjective", otherwise negative.  Allergies  Allergen Reactions  . Sulfa Antibiotics Shortness Of Breath  . Penicillins Rash    Has patient had a PCN reaction causing immediate rash, facial/tongue/throat swelling, SOB or lightheadedness with hypotension: No Has patient had a PCN reaction causing severe rash involving mucus membranes or skin necrosis: No Has patient had a PCN reaction that required hospitalization No Has patient had a PCN reaction occurring within the last 10 years: No If all of the above answers are "NO", then  may proceed with Cephalosporin use.     Current Outpatient Prescriptions  Medication Sig Dispense Refill  . amLODipine (NORVASC) 5 MG tablet Take 1 tablet (5 mg total) by mouth daily. 90 tablet 3  . aspirin EC 81 MG tablet Take 81 mg by mouth daily.    . Capsaicin (ICY HOT ARTHRITIS THERAPY EX) Apply 1 application topically 2 (two) times daily.    . carvedilol (COREG) 12.5 MG tablet Take 1 tablet (12.5 mg total) by mouth 2 (two) times daily with a meal. 60 tablet 6  . fluticasone (FLONASE) 50 MCG/ACT nasal spray USE 2 SPRAYS IN EACH NOSTRIL DAILY 16 g 5  . furosemide (LASIX) 40 MG tablet Take 1 tablet (40 mg total) by mouth daily. 30 tablet 3  . gabapentin (NEURONTIN) 300 MG capsule Take 1 capsule (300 mg total) by mouth 3 (three) times daily. (Patient taking differently: Take 300 mg by mouth at bedtime. ) 90 capsule 0  . glucose blood (ACCU-CHEK AVIVA PLUS) test strip Use as instructed (Patient taking differently: 1 each by Other route 3 (three) times daily. Use as instructed) 100 each 0  . Insulin Detemir (LEVEMIR FLEXTOUCH) 100 UNIT/ML Pen Inject 10 to 20 units once a day (Patient taking differently: Inject 10-20 Units into the skin at bedtime. Inject 10 to 20 units once a day) 15 mL 1  . insulin lispro (HUMALOG KWIKPEN) 100 UNIT/ML KiwkPen Inject up to 15 units prior to each meal and as needed for elevated BG 15 mL 2  . Insulin Syringe-Needle U-100 (B-D INS SYRINGE 0.5CC/30GX1/2") 30G X 1/2" 0.5 ML MISC Use as directed 100 each 0  .  isosorbide mononitrate (IMDUR) 60 MG 24 hr tablet Take 1.5 tablets (90 mg total) by mouth daily. 45 tablet 6  . lisinopril (PRINIVIL,ZESTRIL) 40 MG tablet Take 1 tablet (40 mg total) by mouth daily. 30 tablet 5  . metFORMIN (GLUCOPHAGE-XR) 500 MG 24 hr tablet TAKE 1 TABLET EVERY MORNING WITH BREAKFAST 30 tablet 2  . methocarbamol (ROBAXIN) 500 MG tablet Take 1 tablet (500 mg total) by mouth 3 (three) times daily. 21 tablet 0  . metoCLOPramide (REGLAN) 10 MG  tablet Take 1 tablet (10 mg total) by mouth 3 (three) times daily before meals. 90 tablet 0  . Multiple Vitamins-Minerals (CENTRUM SILVER ADULT 50+ PO) Take 1 tablet by mouth daily.    [name] NEXIUM 40 MG capsule TAKE 1 CAPSULE DAILY AT NOON 30 capsule 3  . nitroGLYCERIN (NITROSTAT) 0.4 MG SL tablet Place 1 tablet (0.4 mg total) under the tongue every 5 (five) minutes as needed. 30 tablet 1  . potassium chloride SA (K-DUR,KLOR-CON) 20 MEQ tablet Take 1 tablet (20 mEq total) by mouth daily. 3 tablet 0  . PROCTOZONE-HC 2.5 % rectal cream APPLY TWICE DAILY 30 g 2  . rosuvastatin (CRESTOR) 20 MG tablet Take 1 tablet (20 mg total) by mouth daily. (Patient taking differently: Take 20 mg by mouth daily as needed (cholesterol). ) 90 tablet 3  . UNIFINE PENTIPS 32G X 4 MM MISC 4 TIMES DAILY 100 each 1  . [DISCONTINUED] ranitidine (ZANTAC) 150 MG tablet Take 1 tablet (150 mg total) by mouth 2 (two) times daily. (Patient not taking: Reported on 11/24/2014) 90 tablet 1   No current facility-administered medications for this visit.    Past Medical History  Diagnosis Date  . CHF (congestive heart failure) ([place])   . HTN (hypertension)   . Acid reflux   . Onychomycosis   . Back pain     f4 and f5  . Diverticulosis   . Hyperlipidemia   . Diabetes mellitus 2005  . H/O chest pain 2005    ECHO  . Hyperlipemia 11/16/2009    Qualifier: Diagnosis of  By: [name]      Past Surgical History  Procedure Laterality Date  . Appendectomy    . Colonoscopy N/A 08/01/2013    Procedure: COLONOSCOPY;  Surgeon: Rogene Houston, MD;  Location: [facility];  Service: Endoscopy;  Laterality: N/A;  rescheduled to [place] notified pt  . Cardiac catheterization  2005    4 frech cath    Social History   Social History  . Marital Status: Married    Spouse Name: N/A  . Number of Children: N/A  . Years of Education: N/A   Occupational History  . Not on file.   Social History Main Topics  . Smoking status:  Never Smoker   . Smokeless tobacco: Current User    Types: Snuff     Comment: dips snuff about 6 cans/week for 3 years  . Alcohol Use: 0.6 oz/week    1 Cans of beer per week     Comment: occ   . Drug Use: Yes    Special: Marijuana     Comment: last used 07/31/13 per pt-61/11/15  . Sexual Activity: Yes   Other Topics Concern  . Not on file   Social History Narrative     Filed Vitals:   04/28/15 1317  BP: 140/88  Pulse: 87  Height: 5\' 9"  (1.753 m)  Weight: 217 lb (98.431 kg)  SpO2: 97%    PHYSICAL  EXAM General: NAD HEENT: Normal. Neck: No JVD, no thyromegaly. Lungs: Clear to auscultation bilaterally with normal respiratory effort. CV: Nondisplaced PMI.  Regular rate and rhythm, normal S1/S2, no S3/S4, no murmur. Trace pretibial and periankle edema.    Abdomen: Soft, nontender, no distention.  Neurologic: Alert and oriented.  Psych: Normal affect. Skin: Normal. Musculoskeletal: No gross deformities.  ECG: Most recent ECG reviewed.      ASSESSMENT AND PLAN: 1. CAD (50% prior LAD lesion):Symptomatically stable for the most part on Imdur 90 mg daily. Previous stress test showed only anterior and anteroseptal wall infarct without ischemia. Continue ASA, Coreg, and Crestor. Will increase amlodipine to 10 mg daily for BP control but will also help with anti-anginal effect.  2. Essential HTN: Borderline elevated. Will increase amlodipine to 10 mg daily.  3. Hyperlipidemia: LDL 166, TC 287, TG 376, HDL 46 on 10/23/14. Has been taking Crestor prn. Encouraged to take 20 mg daily.   4. Acute on chronic systolic heart failure: Increase Lasix to 40 mg bid x 3 days then resume 40 mg daily. On supplemental KCl. Continue Coreg, nitrates, and ACEI.  5. Tobacco and alcohol abuse: Cessation counseling previously provided.  Dispo: f/u 2 months.   Kate Sable, M.D., F.A.C.C.

## 2015-04-30 ENCOUNTER — Encounter (HOSPITAL_COMMUNITY): Payer: Self-pay | Admitting: Internal Medicine

## 2015-05-04 ENCOUNTER — Encounter (HOSPITAL_COMMUNITY)
Admission: RE | Admit: 2015-05-04 | Discharge: 2015-05-04 | Disposition: A | Discharge status: Home / Self Care | Payer: Medicare Other | Source: Ambulatory Visit | Attending: Internal Medicine | Admitting: Internal Medicine

## 2015-05-04 ENCOUNTER — Encounter (HOSPITAL_COMMUNITY): Payer: Self-pay

## 2015-05-04 DIAGNOSIS — R112 Nausea with vomiting, unspecified: Secondary | ICD-10-CM | POA: Insufficient documentation

## 2015-05-04 DIAGNOSIS — R1110 Vomiting, unspecified: Secondary | ICD-10-CM

## 2015-05-04 DIAGNOSIS — R12 Heartburn: Secondary | ICD-10-CM | POA: Insufficient documentation

## 2015-05-04 DIAGNOSIS — IMO0001 Reserved for inherently not codable concepts without codable children: Secondary | ICD-10-CM

## 2015-05-04 MED ORDER — TECHNETIUM TC 99M SULFUR COLLOID
2.0000 | Freq: Once | INTRAVENOUS | Status: AC | PRN
  Administered 2015-05-04: 2 via ORAL

## 2015-05-08 ENCOUNTER — Encounter (HOSPITAL_COMMUNITY): Payer: Self-pay | Admitting: *Deleted

## 2015-05-08 ENCOUNTER — Emergency Department (HOSPITAL_COMMUNITY): Payer: Medicare Other

## 2015-05-08 ENCOUNTER — Observation Stay (HOSPITAL_COMMUNITY)
Admission: EM | Admit: 2015-05-08 | Discharge: 2015-05-10 | Disposition: A | Discharge status: Home / Self Care | Payer: Medicare Other | Attending: Family Medicine | Admitting: Family Medicine

## 2015-05-08 DIAGNOSIS — H5712 Ocular pain, left eye: Secondary | ICD-10-CM | POA: Insufficient documentation

## 2015-05-08 DIAGNOSIS — H538 Other visual disturbances: Secondary | ICD-10-CM | POA: Insufficient documentation

## 2015-05-08 DIAGNOSIS — E785 Hyperlipidemia, unspecified: Secondary | ICD-10-CM | POA: Diagnosis not present

## 2015-05-08 DIAGNOSIS — E1101 Type 2 diabetes mellitus with hyperosmolarity with coma: Secondary | ICD-10-CM | POA: Diagnosis not present

## 2015-05-08 DIAGNOSIS — Z7951 Long term (current) use of inhaled steroids: Secondary | ICD-10-CM | POA: Diagnosis not present

## 2015-05-08 DIAGNOSIS — Z7982 Long term (current) use of aspirin: Secondary | ICD-10-CM | POA: Diagnosis not present

## 2015-05-08 DIAGNOSIS — R739 Hyperglycemia, unspecified: Secondary | ICD-10-CM | POA: Insufficient documentation

## 2015-05-08 DIAGNOSIS — E870 Hyperosmolality and hypernatremia: Secondary | ICD-10-CM | POA: Diagnosis not present

## 2015-05-08 DIAGNOSIS — N179 Acute kidney failure, unspecified: Secondary | ICD-10-CM | POA: Diagnosis not present

## 2015-05-08 DIAGNOSIS — F1910 Other psychoactive substance abuse, uncomplicated: Secondary | ICD-10-CM | POA: Diagnosis not present

## 2015-05-08 DIAGNOSIS — F1410 Cocaine abuse, uncomplicated: Secondary | ICD-10-CM | POA: Insufficient documentation

## 2015-05-08 DIAGNOSIS — E1100 Type 2 diabetes mellitus with hyperosmolarity without nonketotic hyperglycemic-hyperosmolar coma (NKHHC): Secondary | ICD-10-CM | POA: Diagnosis present

## 2015-05-08 DIAGNOSIS — Z794 Long term (current) use of insulin: Secondary | ICD-10-CM | POA: Insufficient documentation

## 2015-05-08 DIAGNOSIS — E11649 Type 2 diabetes mellitus with hypoglycemia without coma: Secondary | ICD-10-CM | POA: Insufficient documentation

## 2015-05-08 DIAGNOSIS — E0865 Diabetes mellitus due to underlying condition with hyperglycemia: Secondary | ICD-10-CM | POA: Insufficient documentation

## 2015-05-08 DIAGNOSIS — E878 Other disorders of electrolyte and fluid balance, not elsewhere classified: Secondary | ICD-10-CM | POA: Diagnosis present

## 2015-05-08 DIAGNOSIS — Z79899 Other long term (current) drug therapy: Secondary | ICD-10-CM | POA: Diagnosis not present

## 2015-05-08 DIAGNOSIS — I2510 Atherosclerotic heart disease of native coronary artery without angina pectoris: Secondary | ICD-10-CM

## 2015-05-08 DIAGNOSIS — I5022 Chronic systolic (congestive) heart failure: Secondary | ICD-10-CM | POA: Insufficient documentation

## 2015-05-08 DIAGNOSIS — I110 Hypertensive heart disease with heart failure: Secondary | ICD-10-CM | POA: Diagnosis not present

## 2015-05-08 DIAGNOSIS — Z720 Tobacco use: Secondary | ICD-10-CM | POA: Diagnosis not present

## 2015-05-08 DIAGNOSIS — I25119 Atherosclerotic heart disease of native coronary artery with unspecified angina pectoris: Secondary | ICD-10-CM | POA: Diagnosis present

## 2015-05-08 DIAGNOSIS — Z23 Encounter for immunization: Secondary | ICD-10-CM | POA: Insufficient documentation

## 2015-05-08 DIAGNOSIS — E871 Hypo-osmolality and hyponatremia: Secondary | ICD-10-CM | POA: Diagnosis present

## 2015-05-08 DIAGNOSIS — E1169 Type 2 diabetes mellitus with other specified complication: Secondary | ICD-10-CM | POA: Insufficient documentation

## 2015-05-08 DIAGNOSIS — H9202 Otalgia, left ear: Secondary | ICD-10-CM | POA: Diagnosis not present

## 2015-05-08 DIAGNOSIS — R358 Other polyuria: Secondary | ICD-10-CM | POA: Diagnosis not present

## 2015-05-08 DIAGNOSIS — I10 Essential (primary) hypertension: Secondary | ICD-10-CM | POA: Diagnosis present

## 2015-05-08 LAB — URINALYSIS, ROUTINE W REFLEX MICROSCOPIC
BILIRUBIN URINE: NEGATIVE
Ketones, ur: NEGATIVE mg/dL
Leukocytes, UA: NEGATIVE
Nitrite: NEGATIVE
PH: 5.5 (ref 5.0–8.0)
Protein, ur: 30 mg/dL — AB

## 2015-05-08 LAB — CBC WITH DIFFERENTIAL/PLATELET
Basophils Absolute: 0 10*3/uL (ref 0.0–0.1)
Basophils Relative: 0 %
EOS PCT: 3 %
Eosinophils Absolute: 0.2 10*3/uL (ref 0.0–0.7)
HEMATOCRIT: 42.2 % (ref 39.0–52.0)
HEMOGLOBIN: 14.2 g/dL (ref 13.0–17.0)
LYMPHS ABS: 1.8 10*3/uL (ref 0.7–4.0)
LYMPHS PCT: 26 %
MCH: 26.9 pg (ref 26.0–34.0)
MCHC: 33.6 g/dL (ref 30.0–36.0)
MCV: 79.9 fL (ref 78.0–100.0)
Monocytes Absolute: 0.3 10*3/uL (ref 0.1–1.0)
Monocytes Relative: 5 %
NEUTROS ABS: 4.5 10*3/uL (ref 1.7–7.7)
NEUTROS PCT: 66 %
Platelets: 210 10*3/uL (ref 150–400)
RBC: 5.28 MIL/uL (ref 4.22–5.81)
RDW: 13 % (ref 11.5–15.5)
WBC: 6.9 10*3/uL (ref 4.0–10.5)

## 2015-05-08 LAB — COMPREHENSIVE METABOLIC PANEL
ALK PHOS: 84 U/L (ref 38–126)
ALT: 24 U/L (ref 17–63)
AST: 21 U/L (ref 15–41)
Albumin: 3.1 g/dL — ABNORMAL LOW (ref 3.5–5.0)
Anion gap: 7 (ref 5–15)
BILIRUBIN TOTAL: 0.5 mg/dL (ref 0.3–1.2)
BUN: 27 mg/dL — AB (ref 6–20)
CALCIUM: 8.4 mg/dL — AB (ref 8.9–10.3)
CO2: 24 mmol/L (ref 22–32)
CREATININE: 1.31 mg/dL — AB (ref 0.61–1.24)
Chloride: 98 mmol/L — ABNORMAL LOW (ref 101–111)
GFR, EST NON AFRICAN AMERICAN: 58 mL/min — AB (ref >60)
Glucose, Bld: 685 mg/dL (ref 65–99)
Potassium: 4.8 mmol/L (ref 3.5–5.1)
Sodium: 129 mmol/L — ABNORMAL LOW (ref 135–145)
TOTAL PROTEIN: 6.1 g/dL — AB (ref 6.5–8.1)

## 2015-05-08 LAB — BASIC METABOLIC PANEL
ANION GAP: 7 (ref 5–15)
BUN: 25 mg/dL — ABNORMAL HIGH (ref 6–20)
CALCIUM: 8.8 mg/dL — AB (ref 8.9–10.3)
CO2: 26 mmol/L (ref 22–32)
CREATININE: 1.12 mg/dL (ref 0.61–1.24)
Chloride: 99 mmol/L — ABNORMAL LOW (ref 101–111)
Glucose, Bld: 404 mg/dL — ABNORMAL HIGH (ref 65–99)
Potassium: 4.2 mmol/L (ref 3.5–5.1)
SODIUM: 132 mmol/L — AB (ref 135–145)

## 2015-05-08 LAB — URINE MICROSCOPIC-ADD ON

## 2015-05-08 LAB — CBG MONITORING, ED
GLUCOSE-CAPILLARY: 483 mg/dL — AB (ref 65–99)
GLUCOSE-CAPILLARY: 429 mg/dL — AB (ref 65–99)
Glucose-Capillary: >600 mg/dL (ref 65–99)
Glucose-Capillary: 288 mg/dL — ABNORMAL HIGH (ref 65–99)

## 2015-05-08 LAB — GLUCOSE, CAPILLARY: Glucose-Capillary: 156 mg/dL — ABNORMAL HIGH (ref 65–99)

## 2015-05-08 MED ORDER — INFLUENZA VAC SPLIT QUAD 0.5 ML IM SUSY
0.5000 mL | PREFILLED_SYRINGE | INTRAMUSCULAR | Status: AC
  Administered 2015-05-09: 0.5 mL via INTRAMUSCULAR
  Filled 2015-05-08: qty 0.5

## 2015-05-08 MED ORDER — METOCLOPRAMIDE HCL 10 MG PO TABS
10.0000 mg | ORAL_TABLET | Freq: Three times a day (TID) | ORAL | Status: DC
  Administered 2015-05-09 – 2015-05-10 (×4): 10 mg via ORAL
  Filled 2015-05-08 (×4): qty 1

## 2015-05-08 MED ORDER — SODIUM CHLORIDE 0.9 % IV SOLN
INTRAVENOUS | Status: AC
  Filled 2015-05-08: qty 2.5

## 2015-05-08 MED ORDER — HEPARIN SODIUM (PORCINE) 5000 UNIT/ML IJ SOLN
5000.0000 [IU] | Freq: Three times a day (TID) | INTRAMUSCULAR | Status: DC
  Administered 2015-05-09 (×4): 5000 [IU] via SUBCUTANEOUS
  Filled 2015-05-08 (×4): qty 1

## 2015-05-08 MED ORDER — ISOSORBIDE MONONITRATE ER 60 MG PO TB24
90.0000 mg | ORAL_TABLET | Freq: Every day | ORAL | Status: DC
  Administered 2015-05-09: 90 mg via ORAL
  Filled 2015-05-08: qty 1

## 2015-05-08 MED ORDER — INSULIN REGULAR HUMAN 100 UNIT/ML IJ SOLN
INTRAMUSCULAR | Status: DC
  Administered 2015-05-08: 4.2 [IU]/h via INTRAVENOUS
  Filled 2015-05-08: qty 2.5

## 2015-05-08 MED ORDER — SODIUM CHLORIDE 0.9 % IV BOLUS (SEPSIS)
500.0000 mL | Freq: Once | INTRAVENOUS | Status: AC
  Administered 2015-05-08: 500 mL via INTRAVENOUS

## 2015-05-08 MED ORDER — AMLODIPINE BESYLATE 5 MG PO TABS
10.0000 mg | ORAL_TABLET | Freq: Every day | ORAL | Status: DC
  Administered 2015-05-09: 10 mg via ORAL
  Filled 2015-05-08: qty 2

## 2015-05-08 MED ORDER — FLUORESCEIN SODIUM 1 MG OP STRP
ORAL_STRIP | OPHTHALMIC | Status: AC
Start: 2015-05-08 — End: 2015-05-08
  Administered 2015-05-08: 18:00:00
  Filled 2015-05-08: qty 1

## 2015-05-08 MED ORDER — POTASSIUM CHLORIDE CRYS ER 20 MEQ PO TBCR: 40.0000 meq | EXTENDED_RELEASE_TABLET | Freq: Once | ORAL | Status: DC

## 2015-05-08 MED ORDER — ADULT MULTIVITAMIN W/MINERALS CH
1.0000 | ORAL_TABLET | Freq: Every day | ORAL | Status: DC
  Administered 2015-05-09: 1 via ORAL
  Filled 2015-05-08: qty 1

## 2015-05-08 MED ORDER — TETRACAINE HCL 0.5 % OP SOLN
2.0000 [drp] | Freq: Once | OPHTHALMIC | Status: AC
  Administered 2015-05-08: 2 [drp] via OPHTHALMIC
  Filled 2015-05-08: qty 4

## 2015-05-08 MED ORDER — GABAPENTIN 300 MG PO CAPS
300.0000 mg | ORAL_CAPSULE | Freq: Every day | ORAL | Status: DC
  Administered 2015-05-09 (×2): 300 mg via ORAL
  Filled 2015-05-08 (×2): qty 1

## 2015-05-08 MED ORDER — DEXTROSE-NACL 5-0.45 % IV SOLN
INTRAVENOUS | Status: DC
  Administered 2015-05-08: 23:00:00 via INTRAVENOUS

## 2015-05-08 MED ORDER — ASPIRIN EC 81 MG PO TBEC
81.0000 mg | DELAYED_RELEASE_TABLET | Freq: Every day | ORAL | Status: DC
Start: 2015-05-09 — End: 2015-05-10
  Administered 2015-05-09: 81 mg via ORAL
  Filled 2015-05-08: qty 1

## 2015-05-08 MED ORDER — SODIUM CHLORIDE 0.9 % IV SOLN
INTRAVENOUS | Status: DC
  Administered 2015-05-09 (×2): via INTRAVENOUS

## 2015-05-08 MED ORDER — PANTOPRAZOLE SODIUM 40 MG PO TBEC
40.0000 mg | DELAYED_RELEASE_TABLET | Freq: Every day | ORAL | Status: DC
  Administered 2015-05-09: 40 mg via ORAL
  Filled 2015-05-08: qty 1

## 2015-05-08 MED ORDER — FUROSEMIDE 40 MG PO TABS
40.0000 mg | ORAL_TABLET | Freq: Every day | ORAL | Status: DC
  Administered 2015-05-09: 40 mg via ORAL
  Filled 2015-05-08: qty 1

## 2015-05-08 MED ORDER — POTASSIUM CHLORIDE CRYS ER 20 MEQ PO TBCR
20.0000 meq | EXTENDED_RELEASE_TABLET | Freq: Every day | ORAL | Status: DC
  Administered 2015-05-09: 20 meq via ORAL
  Filled 2015-05-08: qty 1

## 2015-05-08 MED ORDER — CARVEDILOL 12.5 MG PO TABS
12.5000 mg | ORAL_TABLET | Freq: Two times a day (BID) | ORAL | Status: DC
  Administered 2015-05-09 – 2015-05-10 (×3): 12.5 mg via ORAL
  Filled 2015-05-08 (×3): qty 1

## 2015-05-08 MED ORDER — SODIUM CHLORIDE 0.9 % IV SOLN
INTRAVENOUS | Status: DC
  Administered 2015-05-08: 1.9 [IU]/h via INTRAVENOUS
  Filled 2015-05-08: qty 2.5

## 2015-05-08 MED ORDER — METHOCARBAMOL 500 MG PO TABS
500.0000 mg | ORAL_TABLET | Freq: Three times a day (TID) | ORAL | Status: DC
  Administered 2015-05-09 (×4): 500 mg via ORAL
  Filled 2015-05-08 (×3): qty 1

## 2015-05-08 MED ORDER — ROSUVASTATIN CALCIUM 10 MG PO TABS
10.0000 mg | ORAL_TABLET | Freq: Every day | ORAL | Status: DC
  Administered 2015-05-09: 10 mg via ORAL
  Filled 2015-05-08: qty 1

## 2015-05-08 MED ORDER — POTASSIUM CHLORIDE 10 MEQ/100ML IV SOLN
10.0000 meq | INTRAVENOUS | Status: AC
Start: 2015-05-08 — End: 2015-05-09
  Administered 2015-05-08 – 2015-05-09 (×2): 10 meq via INTRAVENOUS
  Filled 2015-05-08: qty 100

## 2015-05-08 MED ORDER — SODIUM CHLORIDE 0.9 % IV BOLUS (SEPSIS): 1000.0000 mL | Freq: Once | INTRAVENOUS | Status: DC

## 2015-05-08 MED ORDER — IOHEXOL 350 MG/ML SOLN
75.0000 mL | Freq: Once | INTRAVENOUS | Status: AC | PRN
  Administered 2015-05-08: 100 mL via INTRAVENOUS

## 2015-05-08 MED ORDER — FLUTICASONE PROPIONATE 50 MCG/ACT NA SUSP
2.0000 | Freq: Every day | NASAL | Status: DC
  Administered 2015-05-09: 2 via NASAL
  Filled 2015-05-08: qty 16

## 2015-05-08 MED ORDER — NITROGLYCERIN 0.4 MG SL SUBL: 0.4000 mg | SUBLINGUAL_TABLET | SUBLINGUAL | Status: DC | PRN

## 2015-05-08 NOTE — H&P (Signed)
[facility] History and Physical  [name] [id number] DOB: 07-Mar-1955 DOA: 05/08/2015  Referring physician: ED physician PCP: Brad Honour, MD  Specialists:  Brad Mendoza, cardiology  Chief Complaint:  Blurred vision, left eye discomfort   HPI: Brad Mendoza is a 60 y.o. male with PMH of hypertension, hyperlipidemia, and insulin-dependent diabetes mellitus who presents with 2-3 days of polyuria, polydipsia, intermittent blurred vision, and left eye discomfort. Patient had been in his usual state of health until approximately 2-3 days ago when he noted increased thirst and increased urination. There was concomitant development of intermittently blurred vision and left eye pain. He denied any trauma to the eye, eye discharge, fevers, or chills. The blurred vision is intermittent with periods of normal vision interspersed. He wears corrective lenses but has never had similar discomfort previously. He denies any abdominal pain, nausea, vomiting, or diarrhea. He denies dyspnea, cough, chest pain, or palpitations. There's been no change in his diet or medications and he reports compliance, though his wife openly questions this from the bed side. He denies any recent use of alcohol or illicit drugs, denies sick contacts, and denies recent long distance travel. He describes the eye pain as severe at times and throbbing in character. He is unable to identify alleviating or exacerbating factors for the pain.  In ED, patient was found to be afebrile, saturating well on room air, and with vital signs stable. CBG read >600 and serum glucose was noted to be 685. CBC is normal, CMP is also notable for a hyponatremia, hypochloremia, and serum creatinine of 1.3, up from his apparent baseline of 0.9. There are no ketones in his urine, anion gap is normal, and serum bicarbonate is also within the normal range. Intraocular pressure was measured in the ED and fluoroscein exam performed, both  unremarkable. This was followed up with CTA of the head which was also normal. Patient was bolused with normal saline, given topical optic analgesia with tetracaine, and started on insulin infusion. Patient remained stable in the emergency department but serum glucose remained markedly elevated and he will be admitted to the stepdown unit for ongoing evaluation and management of suspected HHS.  Where does patient live?   At home    Can patient participate in ADLs?  Yes        Review of Systems:   General: no fevers, chills, sweats, weight change, poor appetite, or fatigue HEENT: no hearing changes or sore throat. Blurred vision, left eye pain  Pulm: no dyspnea, cough, or wheeze CV: no chest pain or palpitations Abd: no nausea, vomiting, abdominal pain, diarrhea, or constipation GU: no dysuria or hematuria. Polyuria.  Ext: no leg edema Neuro: no focal weakness, numbness, or tingling, no vision change or hearing loss Skin: no rash, no wounds MSK: No muscle spasm, no deformity, no red, hot, or swollen joint Heme: No easy bruising or bleeding Travel history: No recent long distant travel    Allergy:  Allergies  Allergen Reactions  . Sulfa Antibiotics Shortness Of Breath  . Penicillins Rash    Has patient had a PCN reaction causing immediate rash, facial/tongue/throat swelling, SOB or lightheadedness with hypotension: No Has patient had a PCN reaction causing severe rash involving mucus membranes or skin necrosis: No Has patient had a PCN reaction that required hospitalization No Has patient had a PCN reaction occurring within the last 10 years: No If all of the above answers are "NO", then may proceed with Cephalosporin use.  Past Medical History  Diagnosis Date  . CHF (congestive heart failure) ([place])   . HTN (hypertension)   . Acid reflux   . Onychomycosis   . Back pain     f4 and f5  . Diverticulosis   . Hyperlipidemia   . Diabetes mellitus 2005  . H/O chest pain 2005     ECHO  . Hyperlipemia 11/16/2009    Qualifier: Diagnosis of  By: Brad Mendoza      Past Surgical History  Procedure Laterality Date  . Appendectomy    . Colonoscopy N/A 08/01/2013    Procedure: COLONOSCOPY;  Surgeon: Brad Houston, MD;  Location: [facility];  Service: Endoscopy;  Laterality: N/A;  rescheduled to [place] notified pt  . Cardiac catheterization  2005    4 frech cath  . Esophagogastroduodenoscopy N/A 04/23/2015    Procedure: ESOPHAGOGASTRODUODENOSCOPY (EGD);  Surgeon: Brad Houston, MD;  Location: [facility];  Service: Endoscopy;  Laterality: N/A;  1:25    Social History:  reports that he has never smoked. His smokeless tobacco use includes Snuff. He reports that he drinks about 0.6 oz of alcohol per week. He reports that he uses illicit drugs (Marijuana).  Family History:  Family History  Problem Relation Age of Onset  . Hypertension Father   . Diabetes Father   . Cancer Mother   . Alcohol abuse Brother   . Colon cancer Neg Hx      Prior to Admission medications   Medication Sig Start Date End Date Taking? Authorizing Provider  amLODipine (NORVASC) 10 MG tablet Take 1 tablet (10 mg total) by mouth daily. 04/28/15  Yes Brad Commons, MD  aspirin EC 81 MG tablet Take 81 mg by mouth daily.   Yes Historical Provider, MD  Capsaicin (ICY HOT ARTHRITIS THERAPY EX) Apply 1 application topically 2 (two) times daily.   Yes Historical Provider, MD  carvedilol (COREG) 12.5 MG tablet Take 1 tablet (12.5 mg total) by mouth 2 (two) times daily with a meal. 12/23/14  Yes Brad Commons, MD  fluticasone (FLONASE) 50 MCG/ACT nasal spray USE 2 SPRAYS IN EACH NOSTRIL DAILY 12/25/13  Yes Brad Hassell Done, FNP  furosemide (LASIX) 40 MG tablet Take 1 tablet (40 mg total) by mouth daily. 01/31/13  Yes Brad Shanks, MD  gabapentin (NEURONTIN) 300 MG capsule Take 1 capsule (300 mg total) by mouth 3 (three) times daily. Patient taking differently: Take 300 mg by mouth  at bedtime.  06/24/14  Yes Brad Honour, MD  Insulin Detemir (LEVEMIR FLEXTOUCH) 100 UNIT/ML Pen Inject 10 to 20 units once a day Patient taking differently: Inject 10-20 Units into the skin at bedtime. Inject 10 to 20 units once a day 11/07/14  Yes Tammy Eckard, PHARMD  insulin lispro (HUMALOG KWIKPEN) 100 UNIT/ML KiwkPen Inject up to 15 units prior to each meal and as needed for elevated BG 07/03/14  Yes Tammy Eckard, PHARMD  isosorbide mononitrate (IMDUR) 60 MG 24 hr tablet Take 1.5 tablets (90 mg total) by mouth daily. 07/11/14  Yes Brad Commons, MD  lisinopril (PRINIVIL,ZESTRIL) 40 MG tablet Take 1 tablet (40 mg total) by mouth daily. 11/09/12  Yes Brad Shanks, MD  metFORMIN (GLUCOPHAGE-XR) 500 MG 24 hr tablet TAKE 1 TABLET EVERY MORNING WITH BREAKFAST Patient taking differently: TAKE 1 TABLET EVERY MORNING WITH BREAKFAST AS NEEDED FOR BLOOD SUGARS OVER 300 12/23/14  Yes Tammy Eckard, PHARMD  methocarbamol (ROBAXIN) 500 MG tablet Take 1 tablet (500  mg total) by mouth 3 (three) times daily. 12/30/14  Yes Tammy Triplett, PA-C  metoCLOPramide (REGLAN) 10 MG tablet Take 1 tablet (10 mg total) by mouth 3 (three) times daily before meals. 04/23/15  Yes Brad Houston, MD  Multiple Vitamins-Minerals (CENTRUM SILVER ADULT 50+ PO) Take 1 tablet by mouth daily.   Yes Historical Provider, MD  NEXIUM 40 MG capsule TAKE 1 CAPSULE DAILY AT NOON 12/23/14  Yes Brad Honour, MD  nitroGLYCERIN (NITROSTAT) 0.4 MG SL tablet Place 1 tablet (0.4 mg total) under the tongue every 5 (five) minutes as needed. 05/10/13  Yes Brad Shanks, MD  potassium chloride SA (K-DUR,KLOR-CON) 20 MEQ tablet Take 1 tablet (20 mEq total) by mouth daily. 04/17/15  Yes Milton Ferguson, MD  rosuvastatin (CRESTOR) 10 MG tablet Take 1 tablet (10 mg total) by mouth daily. 04/28/15  Yes Brad Commons, MD    Physical Exam: Filed Vitals:   05/08/15 1731 05/08/15 1800 05/08/15 1847 05/08/15 2225  BP: 106/69 143/70 152/78 132/85   Pulse: 83 75 78 77  Temp:    97.9 F (36.6 C)  TempSrc:    Oral  Resp: 16 14 16 18   Height:      Weight:      SpO2: 96% 97% 98% 96%   General: Not in acute distress HEENT:       Eyes: PERRL, EOMI, no scleral icterus or conjunctival pallor. No proptosis, chemosis, or injection. No periorbital swelling or erythema.        ENT: No discharge from the ears or nose, no pharyngeal ulcers, oral mucosa dry and tacky.        Neck: No JVD, no bruit, no appreciable mass Heme: No cervical adenopathy, no pallor Cardiac: S1/S2, RRR, No murmurs, No gallops or rubs. Pulm: Good air movement bilaterally. No rales, wheezing, rhonchi or rubs. Abd: Soft, nondistended, nontender, no rebound pain or gaurding, BS present. Ext: Trace pitting edema of b/l LEs. 2+DP/PT pulse bilaterally. Musculoskeletal: No gross deformity, no red, hot, swollen joints   Skin: No rashes or wounds on exposed surfaces  Neuro: Alert, oriented X3, cranial nerves II-XII grossly intact. No focal findings Psych: Patient is not overtly psychotic, appropriate mood and affect.  Labs on Admission:  Basic Metabolic Panel:  Recent Labs Lab 05/08/15 1652 05/08/15 2122  NA 129* 132*  K 4.8 4.2  CL 98* 99*  CO2 24 26  GLUCOSE 685* 404*  BUN 27* 25*  CREATININE 1.31* 1.12  CALCIUM 8.4* 8.8*   Liver Function Tests:  Recent Labs Lab 05/08/15 1652  AST 21  ALT 24  ALKPHOS 84  BILITOT 0.5  PROT 6.1*  ALBUMIN 3.1*   No results for input(s): LIPASE, AMYLASE in the last 168 hours. No results for input(s): AMMONIA in the last 168 hours. CBC:  Recent Labs Lab 05/08/15 1652  WBC 6.9  NEUTROABS 4.5  HGB 14.2  HCT 42.2  MCV 79.9  PLT 210   Cardiac Enzymes: No results for input(s): CKTOTAL, CKMB, CKMBINDEX, TROPONINI in the last 168 hours.  BNP (last 3 results)  Recent Labs  04/17/15 1251  BNP 130.0*    ProBNP (last 3 results) No results for input(s): PROBNP in the last 8760 hours.  CBG:  Recent Labs Lab  05/08/15 1640 05/08/15 1921 05/08/15 2048 05/08/15 2209  GLUCAP >600* 483* 429* 288*    Radiological Exams on Admission: Ct Angio Head W/cm &/or Wo Cm  05/08/2015  CLINICAL DATA:  Acute left eye pain.  Blurred vision EXAM: CT ANGIOGRAPHY HEAD TECHNIQUE: Multidetector CT imaging of the head was performed using the standard protocol during bolus administration of intravenous contrast. Multiplanar CT image reconstructions and MIPs were obtained to evaluate the vascular anatomy. CONTRAST:  164mL OMNIPAQUE IOHEXOL 350 MG/ML SOLN COMPARISON:  CT head 04/10/2010 FINDINGS: CT HEAD Brain: Ventricle size normal. Cerebral volume normal for age. Negative for acute infarct. Negative for hemorrhage or mass. Calvarium and skull base: Negative Paranasal sinuses: Mild mucosal edema paranasal sinuses. Mastoid sinus clear bilaterally. Orbits: Normal orbit.  No orbital mass or edema. CTA HEAD Anterior circulation: Cavernous carotid appears normal bilaterally without atherosclerotic disease or aneurysm. Anterior and middle cerebral arteries are normal bilaterally. No stenosis or aneurysm. Posterior circulation: Both vertebral arteries are patent to the basilar. PICA not visualized. AICA patent bilaterally. Basilar patent. Superior cerebellar and posterior cerebral arteries patent bilaterally. Patent right posterior communicating artery. No aneurysm. Venous sinuses: Patent Anatomic variants: None Delayed phase:Normal enhancement on delayed imaging. No enhancing mass lesion. IMPRESSION: Normal CTA of the head. Electronically Signed   By: Franchot Gallo M.D.   On: 05/08/2015 21:03    EKG:  Ordered and pending   Assessment/Plan  1. Hyperosmolar nonketotic hyperglycemia, insulin-dependent DM 2  - Presents with serum glucose 689, normal bicarb, no AG, no ketonuria  - Likely secondary to non-adherence to treatment plan given hx of such and inability to describe his treatment regimen - Bolused with NS and started on insulin  infusion in ED  - Continue on insulin drip with NPO until glucose <250; will then feed and transition over to sq  - Follow BMP q4h and replete lytes prn  - A1c pending, carb-modified diet when appropriate    2. Left eye pain, blurred vision   - Uncertain etio, possibly d/t hyperglycemia  - Fluorescein exam and ocular pressure performed by EDP and unremarkable  - Blurred vision has resolved in ED  - Consider outpt ophthalmology referral if pain persists    3. AKI - SCr 1.31 on admission, up from apparent baseline of 0.8  - Suspected secondary to #1 and associated intravascular volume depletion  - Anticipate resolution with IVF resuscitation  - Following BMP q4h while in HHS   4. Hyponatremia  - Likely secondary to hyperglycemia; corrected sodium is 139  - Anticipate resolution with treatment of HHS  - Follow BMPs    5. CAD  - No angina or anginal equivalent sxs, EKG without acute ischemic findings  - Continue Crestor, Imdur, ASA 81, Norvasc - Holding ACEi in light of AKI, will resume when renal fxn stable   6. Hypertension   - Continue home-dose Norvasc  - Hold lisinopril until renal fxn stabilized      DVT ppx: SQ Heparin     Code Status: Full code Family Communication:  Yes, patient's wife at bed side Disposition Plan: Admit to inpatient   Date of Service 05/08/2015    Vianne Bulls, MD [facility] Pager [phone]  If 7PM-7AM, please contact night-coverage [email or web address] Password [email or web address] 05/08/2015, 11:14 PM

## 2015-05-08 NOTE — ED Notes (Signed)
AC that an insulin drip is needed.

## 2015-05-08 NOTE — ED Notes (Signed)
CRITICAL VALUE ALERT  Critical value received: Glucose 689  Date of notification:  05/07/2012  Time of notification:  [id number]  Critical value read back: Yes  Nurse who received alert:  Gearldine Shown, RN   MD notified (1st page):  Dr. Reather Converse

## 2015-05-08 NOTE — ED Notes (Signed)
CBG reading "HIGH" in triage.

## 2015-05-08 NOTE — ED Notes (Signed)
Pt reports left eye pain x 2-3 days. Denies injury. States vision is blurry at times.

## 2015-05-08 NOTE — ED Provider Notes (Signed)
CSN: [id number]     Arrival date & time 05/08/15  1623 History   First MD Initiated Contact with Patient 05/08/15 1726     Chief Complaint  Patient presents with  . Eye Pain; Hyperglycemia      (Consider location/radiation/quality/duration/timing/severity/associated sxs/prior Treatment) HPI Comments: 60 year old male with cardiomyopathy, coronary artery disease, drug abuse including cocaine, high blood pressure, diabetes presents with left eye discomfort. Mild photophobia. Worse over the past 3 days. No history of glaucoma, no injuries, no focal visual deficit. Patient has mild blurry vision in that eye. Patient has 1 classes before. Patient denies any other neurologic symptoms.  The history is provided by the patient.    Past Medical History  Diagnosis Date  . CHF (congestive heart failure) ([place])   . HTN (hypertension)   . Acid reflux   . Onychomycosis   . Back pain     f4 and f5  . Diverticulosis   . Hyperlipidemia   . Diabetes mellitus 2005  . H/O chest pain 2005    ECHO  . Hyperlipemia 11/16/2009    Qualifier: Diagnosis of  By: [name]     Past Surgical History  Procedure Laterality Date  . Appendectomy    . Colonoscopy N/A 08/01/2013    Procedure: COLONOSCOPY;  Surgeon: Rogene Houston, MD;  Location: [facility];  Service: Endoscopy;  Laterality: N/A;  rescheduled to [place] notified pt  . Cardiac catheterization  2005    4 frech cath  . Esophagogastroduodenoscopy N/A 04/23/2015    Procedure: ESOPHAGOGASTRODUODENOSCOPY (EGD);  Surgeon: Rogene Houston, MD;  Location: [facility];  Service: Endoscopy;  Laterality: N/A;  1:25   Family History  Problem Relation Age of Onset  . Hypertension Father   . Diabetes Father   . Cancer Mother   . Alcohol abuse Brother   . Colon cancer Neg Hx    Social History  Substance Use Topics  . Smoking status: Never Smoker   . Smokeless tobacco: Current User    Types: Snuff     Comment: dips snuff about 6 cans/week for  3 years  . Alcohol Use: 0.6 oz/week    1 Cans of beer per week     Comment: occ     Review of Systems  Constitutional: Negative for fever and chills.  HENT: Negative for congestion.   Eyes: Positive for pain and visual disturbance.  Respiratory: Negative for shortness of breath.   Cardiovascular: Negative for chest pain.  Gastrointestinal: Negative for vomiting and abdominal pain.  Endocrine: Positive for polydipsia and polyuria.  Genitourinary: Negative for dysuria and flank pain.  Musculoskeletal: Negative for back pain, neck pain and neck stiffness.  Skin: Negative for rash.  Neurological: Negative for light-headedness and headaches.      Allergies  Sulfa antibiotics and Penicillins  Home Medications   Prior to Admission medications   Medication Sig Start Date End Date Taking? Authorizing Provider  amLODipine (NORVASC) 10 MG tablet Take 1 tablet (10 mg total) by mouth daily. 04/28/15  Yes Herminio Commons, MD  aspirin EC 81 MG tablet Take 81 mg by mouth daily.   Yes Historical Provider, MD  Capsaicin (ICY HOT ARTHRITIS THERAPY EX) Apply 1 application topically 2 (two) times daily.   Yes Historical Provider, MD  carvedilol (COREG) 12.5 MG tablet Take 1 tablet (12.5 mg total) by mouth 2 (two) times daily with a meal. 12/23/14  Yes Herminio Commons, MD  fluticasone (FLONASE) 50 MCG/ACT nasal spray  USE 2 SPRAYS IN EACH NOSTRIL DAILY 12/25/13  Yes Mary-Margaret Hassell Done, FNP  furosemide (LASIX) 40 MG tablet Take 1 tablet (40 mg total) by mouth daily. 01/31/13  Yes Vernie Shanks, MD  gabapentin (NEURONTIN) 300 MG capsule Take 1 capsule (300 mg total) by mouth 3 (three) times daily. Patient taking differently: Take 300 mg by mouth at bedtime.  06/24/14  Yes [name], MD  Insulin Detemir (LEVEMIR FLEXTOUCH) 100 UNIT/ML Pen Inject 10 to 20 units once a day Patient taking differently: Inject 10-20 Units into the skin at bedtime. Inject 10 to 20 units once a day 11/07/14  Yes  Tammy Eckard, PHARMD  insulin lispro (HUMALOG KWIKPEN) 100 UNIT/ML KiwkPen Inject up to 15 units prior to each meal and as needed for elevated BG 07/03/14  Yes Tammy Eckard, PHARMD  isosorbide mononitrate (IMDUR) 60 MG 24 hr tablet Take 1.5 tablets (90 mg total) by mouth daily. 07/11/14  Yes Herminio Commons, MD  lisinopril (PRINIVIL,ZESTRIL) 40 MG tablet Take 1 tablet (40 mg total) by mouth daily. 11/09/12  Yes Vernie Shanks, MD  metFORMIN (GLUCOPHAGE-XR) 500 MG 24 hr tablet TAKE 1 TABLET EVERY MORNING WITH BREAKFAST Patient taking differently: TAKE 1 TABLET EVERY MORNING WITH BREAKFAST AS NEEDED FOR BLOOD SUGARS OVER 300 12/23/14  Yes Tammy Eckard, PHARMD  methocarbamol (ROBAXIN) 500 MG tablet Take 1 tablet (500 mg total) by mouth 3 (three) times daily. 12/30/14  Yes Tammy Triplett, PA-C  metoCLOPramide (REGLAN) 10 MG tablet Take 1 tablet (10 mg total) by mouth 3 (three) times daily before meals. 04/23/15  Yes Rogene Houston, MD  Multiple Vitamins-Minerals (CENTRUM SILVER ADULT 50+ PO) Take 1 tablet by mouth daily.   Yes Historical Provider, MD  NEXIUM 40 MG capsule TAKE 1 CAPSULE DAILY AT NOON 12/23/14  Yes [name], MD  nitroGLYCERIN (NITROSTAT) 0.4 MG SL tablet Place 1 tablet (0.4 mg total) under the tongue every 5 (five) minutes as needed. 05/10/13  Yes Vernie Shanks, MD  potassium chloride SA (K-DUR,KLOR-CON) 20 MEQ tablet Take 1 tablet (20 mEq total) by mouth daily. 04/17/15  Yes Milton Ferguson, MD  rosuvastatin (CRESTOR) 10 MG tablet Take 1 tablet (10 mg total) by mouth daily. 04/28/15  Yes Herminio Commons, MD   BP 152/78 mmHg  Pulse 78  Temp(Src) 99 F (37.2 C) (Oral)  Resp 16  Ht 5\' 9"  (1.753 m)  Wt 220 lb (99.791 kg)  BMI 32.47 kg/m2  SpO2 98% Physical Exam  Constitutional: He is oriented to person, place, and time. He appears well-developed and well-nourished.  HENT:  Head: Normocephalic and atraumatic.  Dry mm  Eyes: Conjunctivae are normal. Right eye exhibits no  discharge. Left eye exhibits no discharge.  Neck: Normal range of motion. Neck supple. No tracheal deviation present.  Cardiovascular: Normal rate and regular rhythm.   Pulmonary/Chest: Effort normal and breath sounds normal.  Abdominal: Soft. He exhibits no distension. There is no tenderness. There is no guarding.  Musculoskeletal: He exhibits no edema.  Neurological: He is alert and oriented to person, place, and time. Coordination normal.  5+ strength in UE and LE with f/e at major joints. Sensation to palpation intact in UE and LE. CNs 2-12 grossly intact.  EOMFI.  PERRL.   Finger nose and coordination intact bilateral.   Visual Mago intact to finger testing. No nystagmus   Skin: Skin is warm. No rash noted.  Psychiatric: He has a normal mood and affect.  Nursing note  and vitals reviewed.   ED Course  Procedures (including critical care time) Ultrasound: Limited Ocular  Performed and interpreted by Dr Reather Converse Indication: eye pain Using high frequency linear probe, ultrasound of the globe was performed in real time in two planes with patient looking left and right if able Interpretation: no retinal detachment visualized.  Lens was in proper location.  No hemorrhage appreciated.  Images were electronically archived.   CRITICAL CARE Performed by: Mariea Clonts   Total critical care time: 35 minutes  Critical care time was exclusive of separately billable procedures and treating other patients.  Critical care was necessary to treat or prevent imminent or life-threatening deterioration.  Critical care was time spent personally by me on the following activities: development of treatment plan with patient and/or surrogate as well as nursing, discussions with consultants, evaluation of patient's response to treatment, examination of patient, obtaining history from patient or surrogate, ordering and performing treatments and interventions, ordering and review of laboratory  studies, ordering and review of radiographic studies, pulse oximetry and re-evaluation of patient's condition.   Labs Review Labs Reviewed  COMPREHENSIVE METABOLIC PANEL - Abnormal; Notable for the following:    Sodium 129 (*)    Chloride 98 (*)    Glucose, Bld 685 (*)    BUN 27 (*)    Creatinine, Ser 1.31 (*)    Calcium 8.4 (*)    Total Protein 6.1 (*)    Albumin 3.1 (*)    GFR calc non Af Amer 58 (*)    All other components within normal limits  URINALYSIS, ROUTINE W REFLEX MICROSCOPIC (NOT AT [facility]) - Abnormal; Notable for the following:    Specific Gravity, Urine <1.005 (*)    Glucose, UA >1000 (*)    Hgb urine dipstick TRACE (*)    Protein, ur 30 (*)    All other components within normal limits  URINE MICROSCOPIC-ADD ON - Abnormal; Notable for the following:    Squamous Epithelial / LPF 0-5 (*)    Bacteria, UA RARE (*)    All other components within normal limits  CBG MONITORING, ED - Abnormal; Notable for the following:    Glucose-Capillary >600 (*)    All other components within normal limits  CBG MONITORING, ED - Abnormal; Notable for the following:    Glucose-Capillary 483 (*)    All other components within normal limits  CBG MONITORING, ED - Abnormal; Notable for the following:    Glucose-Capillary 429 (*)    All other components within normal limits  CBG MONITORING, ED - Abnormal; Notable for the following:    Glucose-Capillary 288 (*)    All other components within normal limits  CBC WITH DIFFERENTIAL/PLATELET  BASIC METABOLIC PANEL    Imaging Review Ct Angio Head W/cm &/or Wo Cm  05/08/2015  CLINICAL DATA:  Acute left eye pain.  Blurred vision EXAM: CT ANGIOGRAPHY HEAD TECHNIQUE: Multidetector CT imaging of the head was performed using the standard protocol during bolus administration of intravenous contrast. Multiplanar CT image reconstructions and MIPs were obtained to evaluate the vascular anatomy. CONTRAST:  15mL OMNIPAQUE IOHEXOL 350 MG/ML SOLN  COMPARISON:  CT head 04/10/2010 FINDINGS: CT HEAD Brain: Ventricle size normal. Cerebral volume normal for age. Negative for acute infarct. Negative for hemorrhage or mass. Calvarium and skull base: Negative Paranasal sinuses: Mild mucosal edema paranasal sinuses. Mastoid sinus clear bilaterally. Orbits: Normal orbit.  No orbital mass or edema. CTA HEAD Anterior circulation: Cavernous carotid appears normal bilaterally without atherosclerotic disease  or aneurysm. Anterior and middle cerebral arteries are normal bilaterally. No stenosis or aneurysm. Posterior circulation: Both vertebral arteries are patent to the basilar. PICA not visualized. AICA patent bilaterally. Basilar patent. Superior cerebellar and posterior cerebral arteries patent bilaterally. Patent right posterior communicating artery. No aneurysm. Venous sinuses: Patent Anatomic variants: None Delayed phase:Normal enhancement on delayed imaging. No enhancing mass lesion. IMPRESSION: Normal CTA of the head. Electronically Signed   By: Franchot Gallo M.D.   On: 05/08/2015 21:03   I have personally reviewed and evaluated these images and lab results as part of my medical decision-making.   EKG Interpretation None      MDM   Final diagnoses:  Acute left eye pain  Diabetes mellitus due to underlying condition with hyperglycemia, unspecified long term insulin use status ([place])   Patient presents with mild ache in the left eye fairly constant today. Patient has no obvious foreign body on exam, intraocular pressure normal 12 and 13. No other focal neuro deficits. Patient does have multiple vascular risk factors, CT angina ordered for further delineation. No papilledema.  CT angiogram no signs of acute clot. Patient normal neuro exam the ER. Glucose improved with glucose drip, titrated. IV fluids given. Discussed observation with [facility] hospitalist on stepdown for further treatment. Patient will need close follow-up with ophthalmology  outpatient.  Results and differential diagnosis were discussed with the patient/parent/guardian. Xrays were independently reviewed by myself.  Close follow up outpatient was discussed, comfortable with the plan.   Medications  insulin regular (NOVOLIN R,HUMULIN R) 250 Units in sodium chloride 0.9 % 250 mL (1 Units/mL) infusion (7.4 Units/hr Intravenous Rate/Dose Change 05/08/15 2050)  sodium chloride 0.9 % bolus 500 mL (0 mLs Intravenous Stopped 05/08/15 1811)  tetracaine (PONTOCAINE) 0.5 % ophthalmic solution 2 drop (2 drops Left Eye Given by Other 05/08/15 1749)  fluorescein 1 MG ophthalmic strip (  Given by Other 05/08/15 1749)  iohexol (OMNIPAQUE) 350 MG/ML injection 75 mL (100 mLs Intravenous Contrast Given 05/08/15 2002)    Filed Vitals:   05/08/15 1630 05/08/15 1731 05/08/15 1800 05/08/15 1847  BP: 152/96 106/69 143/70 152/78  Pulse: 82 83 75 78  Temp: 99 F (37.2 C)     TempSrc: Oral     Resp: 18 16 14 16   Height: 5\' 9"  (1.753 m)     Weight: 220 lb (99.791 kg)     SpO2: 99% 96% 97% 98%    Final diagnoses:  Acute left eye pain  Diabetes mellitus due to underlying condition with hyperglycemia, unspecified long term insulin use status (HCC)       Elnora Morrison, MD 05/08/15 2212

## 2015-05-09 DIAGNOSIS — H5712 Ocular pain, left eye: Secondary | ICD-10-CM

## 2015-05-09 DIAGNOSIS — N179 Acute kidney failure, unspecified: Secondary | ICD-10-CM | POA: Diagnosis not present

## 2015-05-09 DIAGNOSIS — E1101 Type 2 diabetes mellitus with hyperosmolarity with coma: Secondary | ICD-10-CM | POA: Diagnosis not present

## 2015-05-09 DIAGNOSIS — E0865 Diabetes mellitus due to underlying condition with hyperglycemia: Secondary | ICD-10-CM | POA: Insufficient documentation

## 2015-05-09 LAB — GLUCOSE, CAPILLARY
GLUCOSE-CAPILLARY: 224 mg/dL — AB (ref 65–99)
GLUCOSE-CAPILLARY: 314 mg/dL — AB (ref 65–99)
Glucose-Capillary: 125 mg/dL — ABNORMAL HIGH (ref 65–99)
Glucose-Capillary: 140 mg/dL — ABNORMAL HIGH (ref 65–99)
Glucose-Capillary: 147 mg/dL — ABNORMAL HIGH (ref 65–99)
Glucose-Capillary: 150 mg/dL — ABNORMAL HIGH (ref 65–99)
Glucose-Capillary: 168 mg/dL — ABNORMAL HIGH (ref 65–99)
Glucose-Capillary: 170 mg/dL — ABNORMAL HIGH (ref 65–99)
Glucose-Capillary: 171 mg/dL — ABNORMAL HIGH (ref 65–99)
Glucose-Capillary: 179 mg/dL — ABNORMAL HIGH (ref 65–99)
Glucose-Capillary: 203 mg/dL — ABNORMAL HIGH (ref 65–99)
Glucose-Capillary: 207 mg/dL — ABNORMAL HIGH (ref 65–99)
Glucose-Capillary: 227 mg/dL — ABNORMAL HIGH (ref 65–99)
Glucose-Capillary: 253 mg/dL — ABNORMAL HIGH (ref 65–99)
Glucose-Capillary: 278 mg/dL — ABNORMAL HIGH (ref 65–99)
Glucose-Capillary: 302 mg/dL — ABNORMAL HIGH (ref 65–99)

## 2015-05-09 LAB — BASIC METABOLIC PANEL
ANION GAP: 7 (ref 5–15)
Anion gap: 4 — ABNORMAL LOW (ref 5–15)
BUN: 23 mg/dL — ABNORMAL HIGH (ref 6–20)
BUN: 25 mg/dL — ABNORMAL HIGH (ref 6–20)
CALCIUM: 7.9 mg/dL — AB (ref 8.9–10.3)
CALCIUM: 8.7 mg/dL — AB (ref 8.9–10.3)
CO2: 24 mmol/L (ref 22–32)
CO2: 27 mmol/L (ref 22–32)
Chloride: 102 mmol/L (ref 101–111)
Chloride: 107 mmol/L (ref 101–111)
Creatinine, Ser: 0.9 mg/dL (ref 0.61–1.24)
Creatinine, Ser: 1.15 mg/dL (ref 0.61–1.24)
GFR calc non Af Amer: >60 mL/min (ref >60)
Glucose, Bld: 156 mg/dL — ABNORMAL HIGH (ref 65–99)
Glucose, Bld: 171 mg/dL — ABNORMAL HIGH (ref 65–99)
POTASSIUM: 4 mmol/L (ref 3.5–5.1)
Potassium: 3.8 mmol/L (ref 3.5–5.1)
Sodium: 135 mmol/L (ref 135–145)
Sodium: 136 mmol/L (ref 135–145)

## 2015-05-09 LAB — MRSA PCR SCREENING: MRSA by PCR: NEGATIVE

## 2015-05-09 LAB — MAGNESIUM: Magnesium: 2.4 mg/dL (ref 1.7–2.4)

## 2015-05-09 LAB — RAPID URINE DRUG SCREEN, HOSP PERFORMED
Amphetamines: NOT DETECTED
Barbiturates: NOT DETECTED
Benzodiazepines: NOT DETECTED
COCAINE: NOT DETECTED
OPIATES: NOT DETECTED
TETRAHYDROCANNABINOL: POSITIVE — AB

## 2015-05-09 MED ORDER — INSULIN ASPART 100 UNIT/ML ~~LOC~~ SOLN
3.0000 [IU] | Freq: Three times a day (TID) | SUBCUTANEOUS | Status: DC
  Administered 2015-05-09 – 2015-05-10 (×2): 3 [IU] via SUBCUTANEOUS

## 2015-05-09 MED ORDER — INSULIN ASPART 100 UNIT/ML ~~LOC~~ SOLN
0.0000 [IU] | Freq: Three times a day (TID) | SUBCUTANEOUS | Status: DC
  Administered 2015-05-09: 3 [IU] via SUBCUTANEOUS
  Administered 2015-05-10: 1 [IU] via SUBCUTANEOUS

## 2015-05-09 MED ORDER — INSULIN DETEMIR 100 UNIT/ML FLEXPEN: 10.0000 [IU] | PEN_INJECTOR | Freq: Once | SUBCUTANEOUS | Status: DC

## 2015-05-09 MED ORDER — INSULIN DETEMIR 100 UNIT/ML ~~LOC~~ SOLN
10.0000 [IU] | Freq: Every day | SUBCUTANEOUS | Status: DC
  Administered 2015-05-09: 10 [IU] via SUBCUTANEOUS
  Filled 2015-05-09 (×2): qty 0.1

## 2015-05-09 MED ORDER — INSULIN DETEMIR 100 UNIT/ML ~~LOC~~ SOLN
10.0000 [IU] | Freq: Once | SUBCUTANEOUS | Status: AC
  Administered 2015-05-09: 10 [IU] via SUBCUTANEOUS
  Filled 2015-05-09: qty 0.1

## 2015-05-09 MED ORDER — INSULIN ASPART 100 UNIT/ML ~~LOC~~ SOLN
0.0000 [IU] | Freq: Every day | SUBCUTANEOUS | Status: DC
  Administered 2015-05-09: 4 [IU] via SUBCUTANEOUS

## 2015-05-09 NOTE — Progress Notes (Signed)
PROGRESS NOTE  Brad Mendoza [id number] DOB: 21-Nov-1955 DOA: 05/08/2015 PCP: [name], MD  Summary: 66 yom PMH DM type 2 presented with polyuria, polydipsia, blurred vision, left eye discomfort. Initial evaluation revealed hypoosmolar hyperglycemia without ketosis. Patient started on insulin infusion and admitted. Thorough examination in the emergency department revealed no abnormalities of the left eye.  Assessment/Plan: 1. Hyperosmolar nonketotic hyperglycemia. Resolved. Likely secondary to noncompliance. No leukocytosis or fever.urinalysis negative. 2. DM type 2, suspect uncontrolled by history, with possible diabetic gastroparesis.  3. Left eye pain 3+ days. Possibly secondary to diabetes. No h/x glaucoma, injury, visual deficit. Per EDP exam, no papilledema, FB; normal intraocular pressure, normal bedside u/s: no retinal detachment visualized. Lens was in proper location. No hemorrhage appreciated. Outpatient evaluation recommended. 4. AKI, secondary to osmotic diuresis secondary to hyperglycemia. Resolved.  5. Hyponatremia, resolved.  6. PMH Chronic systolic heart failure, CAD. No angina or anginal equivalent 7. Essential HTN, stable. Continue current medications.  8. HLD. Continue statin. 9. Alcohol use disorder 10. Tobacco use disorder 11. Cocaine use, polysubstance abuse   Improving. AG and CO2 normalized.   Discontinue insulin infusion today. Transition to Levemir.  Discontinue telemetry and transfer to medical floor.    Code Status: Full DVT prophylaxis: Heparin  Family Communication: No family bedside  Disposition Plan: Transfer to medical floor. Anticipate discharge in 24 hours.   Murray Hodgkins, MD  [facility]  Pager [phone] If 7PM-7AM, please contact night-coverage at [email or web address], password [facility] 05/09/2015, 6:47 AM    Consultants:  None  Procedures:  None  Antibiotics:  None  HPI/Subjective: Feeling pretty good. No reports of  chest pain, shortness of breath, n/v/d, or abd pain.Was able to eat without difficulty. Left eye is still in pain. Denies any trauma to the eye or history of discomfort. Admits BS was high a few days prior to admission. Some blurred vision.  Objective: Filed Vitals:   05/09/15 0100 05/09/15 0200 05/09/15 0300 05/09/15 0400  BP: 137/84 142/95 137/82 129/85  Pulse: 79 71 76 76  Temp:    98.3 F (36.8 C)  TempSrc:    Oral  Resp: 16 12 10 16   Height:      Weight:    94.3 kg (207 lb 14.3 oz)  SpO2: 97% 100% 100% 95%    Intake/Output Summary (Last 24 hours) at 05/09/15 0647 Last data filed at 05/09/15 0205  Gross per 24 hour  Intake    500 ml  Output    400 ml  Net    100 ml     Filed Weights   05/08/15 1630 05/09/15 0400  Weight: 99.791 kg (220 lb) 94.3 kg (207 lb 14.3 oz)    Exam:  Afebrile, VSS, no hypoxia General:  Appears calm and comfortable. Sitting up in room chair.  Eyes: PERRL, normal lids, irises & conjunctiva ENT: grossly normal hearing, lips & tongue Cardiovascular: RRR, no m/r/g. No LE edema. Telemetry: SR, no arrhythmias  Respiratory: CTA bilaterally, no w/r/r. Normal respiratory effort. Abdomen: soft, ntnd Psychiatric: grossly normal mood and affect, speech fluent and appropriate Neurologic: grossly non-focal.  New data reviewed:  Sodium normalized.   Glucose improving 171  BMP unremarkable with normal AG and CO2.  Scheduled Meds: . amLODipine  10 mg Oral Daily  . aspirin EC  81 mg Oral Daily  . carvedilol  12.5 mg Oral BID WC  . fluticasone  2 spray Each Nare Daily  . furosemide  40 mg Oral Daily  . gabapentin  300 mg Oral QHS  . heparin  5,000 Units Subcutaneous 3 times per day  . Influenza vac split quadrivalent PF  0.5 mL Intramuscular Tomorrow-1000  . isosorbide mononitrate  90 mg Oral Daily  . methocarbamol  500 mg Oral TID  . metoCLOPramide  10 mg Oral TID AC  . multivitamin with minerals  1 tablet Oral Daily  . pantoprazole  40 mg Oral  Daily  . potassium chloride SA  20 mEq Oral Daily  . rosuvastatin  10 mg Oral Daily   Continuous Infusions: . sodium chloride    . dextrose 5 % and 0.45% NaCl 75 mL/hr at 05/08/15 2323  . insulin (NOVOLIN-R) infusion 1.2 Units/hr (05/09/15 [id number])    Principal Problem:   Type 2 diabetes mellitus with hyperosmolar nonketotic hyperglycemia (HCC) Active Problems:   Essential hypertension   Polysubstance abuse   CAD (coronary artery disease)   AKI (acute kidney injury) ([place])   Hyponatremia   Hypochloremia   Time spent 25 minutes    By signing my name below, I, Rennis Harding attest that this documentation has been prepared under the direction and in the presence of Murray Hodgkins, MD Electronically signed: Rennis Harding  05/09/2015 9:30am   I personally performed the services described in this documentation. All medical record entries made by the scribe were at my direction. I have reviewed the chart and agree that the record reflects my personal performance and is accurate and complete. Murray Hodgkins, MD

## 2015-05-09 NOTE — Progress Notes (Signed)
Insulin drip stopped at this time

## 2015-05-10 DIAGNOSIS — N179 Acute kidney failure, unspecified: Secondary | ICD-10-CM | POA: Diagnosis not present

## 2015-05-10 DIAGNOSIS — H5712 Ocular pain, left eye: Secondary | ICD-10-CM | POA: Diagnosis not present

## 2015-05-10 DIAGNOSIS — E1101 Type 2 diabetes mellitus with hyperosmolarity with coma: Secondary | ICD-10-CM | POA: Diagnosis not present

## 2015-05-10 MED ORDER — INSULIN DETEMIR 100 UNIT/ML FLEXPEN: 20.0000 [IU] | PEN_INJECTOR | Freq: Every day | SUBCUTANEOUS | Status: DC

## 2015-05-10 MED ORDER — INSULIN DETEMIR 100 UNIT/ML ~~LOC~~ SOLN
10.0000 [IU] | Freq: Once | SUBCUTANEOUS | Status: AC
  Administered 2015-05-10: 10 [IU] via SUBCUTANEOUS
  Filled 2015-05-10: qty 0.1

## 2015-05-10 NOTE — Plan of Care (Signed)
Problem: Physical Regulation: Goal: Ability to maintain clinical measurements within normal limits will improve Outcome: Progressing Blood sugar was 302.

## 2015-05-10 NOTE — Progress Notes (Signed)
PROGRESS NOTE  Brad Mendoza [id number] DOB: 11/24/1955 DOA: 05/08/2015 PCP: [name], MD  Summary: 47 yom PMH DM type 2 presented with polyuria, polydipsia, blurred vision, left eye discomfort. Initial evaluation revealed hypoosmolar hyperglycemia without ketosis. Patient started on insulin infusion and admitted. Thorough examination in the emergency department revealed no abnormalities of the left eye.  Assessment/Plan: 1. Hyperosmolar nonketotic hyperglycemia. Resolved. Secondary to noncompliance. Patient had run out of Levemir at home. 2. DM type 2, suspect uncontrolled by history. Hemoglobin A1c pending. 3. Left eye pain 3+ days. Much improved. Suspect secondary to diabetes. No evidence of infection. No visual disturbance. No h/x glaucoma, injury, visual deficit. Per EDP exam, no papilledema, FB; normal intraocular pressure, normal bedside u/s: no retinal detachment visualized. Lens was in proper location. No hemorrhage appreciated. Outpatient evaluation recommended. Patient will arrange. 4. AKI, secondary to osmotic diuresis secondary to hyperglycemia. Resolved.  5. PMH Chronic systolic heart failure, CAD. Stable. 6. Alcohol use disorder. No signs of withdrawal.  7. Tobacco use disorder. Counseled on cessation.  8. Polysubstance abuse   Overall much improved. Discharge home today. Resume Levemir.  Follow up outpatient opthalmology for left eye pain. Patient will arrange. Recommend artifical teardrop for dryness.   Code Status: Full DVT prophylaxis: Heparin  Family Communication: No family bedside  Disposition Plan:Discharge home today  Murray Hodgkins, MD  [facility]  Pager [phone] If 7PM-7AM, please contact night-coverage at [email or web address], password [facility] 05/10/2015, 8:11 AM    Consultants:  None  Procedures:  None  Antibiotics:  None  HPI/Subjective: Feeling good. No reports of chest pain, shortness of breath, n/v/d, or abd pain. Has mild  gas, but otherwise fine. Left eye pain decreased, no visual disturbance. He reports he's had poor tear duct function in the past. Feels like eye is dry.  Objective: Filed Vitals:   05/09/15 1617 05/09/15 1700 05/09/15 1800 05/09/15 2130  BP: 123/82 117/86 119/73 119/68  Pulse: 80 83 83 77  Temp:    98.4 F (36.9 C)  TempSrc:    Oral  Resp: 15 20 14 20   Height:      Weight:    95.2 kg (209 lb 14.1 oz)  SpO2: 100% 98% 100% 100%    Intake/Output Summary (Last 24 hours) at 05/10/15 0811 Last data filed at 05/10/15 0100  Gross per 24 hour  Intake 2106.66 ml  Output   1575 ml  Net 531.66 ml     Filed Weights   05/08/15 1630 05/09/15 0400 05/09/15 2130  Weight: 99.791 kg (220 lb) 94.3 kg (207 lb 14.3 oz) 95.2 kg (209 lb 14.1 oz)    Exam: General:  Appears calm and comfortable. Sitting up in bed. Eyes: Left eye sclera slightly injected :EOMI,  pupil and irises normal No exudate.  Cardiovascular: RRR, no m/r/g. No LE edema. Respiratory: CTA bilaterally, no w/r/r. Normal respiratory effort. Psychiatric: grossly normal mood and affect, speech fluent and appropriate  New data reviewed:  CBG stable   Scheduled Meds: . amLODipine  10 mg Oral Daily  . aspirin EC  81 mg Oral Daily  . carvedilol  12.5 mg Oral BID WC  . fluticasone  2 spray Each Nare Daily  . furosemide  40 mg Oral Daily  . gabapentin  300 mg Oral QHS  . heparin  5,000 Units Subcutaneous 3 times per day  . insulin aspart  0-5 Units Subcutaneous QHS  . insulin aspart  0-9 Units Subcutaneous TID WC  . insulin aspart  3  Units Subcutaneous TID WC  . insulin detemir  10 Units Subcutaneous QHS  . isosorbide mononitrate  90 mg Oral Daily  . methocarbamol  500 mg Oral TID  . metoCLOPramide  10 mg Oral TID AC  . multivitamin with minerals  1 tablet Oral Daily  . pantoprazole  40 mg Oral Daily  . potassium chloride SA  20 mEq Oral Daily  . rosuvastatin  10 mg Oral Daily   Continuous Infusions: . sodium chloride 100  mL/hr at 05/09/15 2021    Principal Problem:   Type 2 diabetes mellitus with hyperosmolar nonketotic hyperglycemia (HCC) Active Problems:   Essential hypertension   Polysubstance abuse   CAD (coronary artery disease)   AKI (acute kidney injury) ([place])   Hyponatremia   Hypochloremia   Acute left eye pain   Diabetes mellitus due to underlying condition with hyperglycemia ([place])    By signing my name below, I, Rennis Harding attest that this documentation has been prepared under the direction and in the presence of Murray Hodgkins, MD Electronically signed: Rennis Harding  05/10/2015 8:48am   I personally performed the services described in this documentation. All medical record entries made by the scribe were at my direction. I have reviewed the chart and agree that the record reflects my personal performance and is accurate and complete. Murray Hodgkins, MD

## 2015-05-10 NOTE — Progress Notes (Signed)
Discharge instructions read to patient Pt verbalized understanding of all instructions Levemir per order 10 units give to patient prior to discharge home with family

## 2015-05-10 NOTE — Discharge Summary (Signed)
Physician Discharge Summary  Brad Mendoza [id number] DOB: April 02, 1955 DOA: 05/08/2015  PCP: [name], MD  Admit date: 05/08/2015 Discharge date: 05/10/2015  Recommendations for Outpatient Follow-up:   Follow up with PCPIn 1 week. Follow-up diabetes.  Follow up with outpatient opthalmology for left eye pain.   Follow-up Information    Follow up with [name], MD. Schedule an appointment as soon as possible for a visit in 1 week.   Specialty:  Family Medicine   Contact information:   [place] [address] [phone]      Discharge Diagnoses:  1. Hyperosmolar nonketotic hyperglycemia. 2. DM type 2, Uncontrolled 3. Left eye pain  4. AKI  Discharge Condition: Improved Disposition: Discharge home  Diet recommendation: heart-healthy  Filed Weights   05/08/15 1630 05/09/15 0400 05/09/15 2130  Weight: 99.791 kg (220 lb) 94.3 kg (207 lb 14.3 oz) 95.2 kg (209 lb 14.1 oz)    History of present illness:  69 yom PMH DM type 2 presented with polyuria, polydipsia, blurred vision, left eye discomfort. Initial evaluation revealed hypoosmolar hyperglycemia without ketosis. Patient started on insulin infusion and admitted. Thorough examination in the emergency department revealed no abnormalities of the left eye.  Hospital Course:  Patient was admitted due to complaints of with polyuria, polydipsia, blurred vision, left eye discomfort. Initial evaluation revealed hypoosmolar hyperglycemia without ketosis. Blood sugar noted to be 685 on admission. He was started on insulin infusion with improvement in CBG. Transition to subcutaneous insulin. He also complained of left eye pain that is believed to be secondary to uncontrolled diabetes. Exam in the ED and hospital unremarkable. He reports improvement in eye pain consistent with improvement in blood sugars. On discharge still has dryness in eye for which artifical tear drops were recommended.  Individual  issues as below:  1. Hyperosmolar nonketotic hyperglycemia. Resolved. Secondary to noncompliance. Patient had run out of Levemir at home. 2. DM type 2, suspect uncontrolled by history. Hemoglobin A1c pending. 3. Left eye pain 3+ days. Much improved. Suspect secondary to diabetes. No evidence of infection. No visual disturbance. No h/x glaucoma, injury, visual deficit. Per EDP exam, no papilledema, FB; normal intraocular pressure, normal bedside u/s: no retinal detachment visualized. Lens was in proper location. No hemorrhage appreciated. Outpatient evaluation recommended. Patient will arrange. 4. AKI, secondary to osmotic diuresis secondary to hyperglycemia. Resolved.  5. PMH Chronic systolic heart failure, CAD. Stable. 6. Alcohol use disorder. No signs of withdrawal.  7. Tobacco use disorder. Counseled on cessation.  8. Polysubstance abuse  Consultants:  None  Procedures:  None  Discharge Instructions  Discharge Instructions    Activity as tolerated - No restrictions    Complete by:  As directed      Diet - low sodium heart healthy    Complete by:  As directed      Diet Carb Modified    Complete by:  As directed      Discharge instructions    Complete by:  As directed   Call your physician or seek immediate medical attention for blood sugar greater than 400, less than 65, pain in eye, redness of eye, difficulty seeing or worsening of condition.          Discharge Medication List as of 05/10/2015  9:05 AM    CONTINUE these medications which have CHANGED   Details  Insulin Detemir (LEVEMIR FLEXTOUCH) 100 UNIT/ML Pen Inject 20 Units into the skin daily at 10 pm., Starting 05/10/2015, Until Discontinued,  No Print      CONTINUE these medications which have NOT CHANGED   Details  amLODipine (NORVASC) 10 MG tablet Take 1 tablet (10 mg total) by mouth daily., Starting 04/28/2015, Until Discontinued, Normal    aspirin EC 81 MG tablet Take 81 mg by mouth daily., Until Discontinued,  Historical Med    Capsaicin (ICY HOT ARTHRITIS THERAPY EX) Apply 1 application topically 2 (two) times daily., Until Discontinued, Historical Med    carvedilol (COREG) 12.5 MG tablet Take 1 tablet (12.5 mg total) by mouth 2 (two) times daily with a meal., Starting 12/23/2014, Until Discontinued, Normal    fluticasone (FLONASE) 50 MCG/ACT nasal spray USE 2 SPRAYS IN EACH NOSTRIL DAILY, Normal    furosemide (LASIX) 40 MG tablet Take 1 tablet (40 mg total) by mouth daily., Starting 01/31/2013, Until Discontinued, Normal    gabapentin (NEURONTIN) 300 MG capsule Take 1 capsule (300 mg total) by mouth 3 (three) times daily., Starting 06/24/2014, Until Discontinued, Normal    insulin lispro (HUMALOG KWIKPEN) 100 UNIT/ML KiwkPen Inject up to 15 units prior to each meal and as needed for elevated BG, Normal    isosorbide mononitrate (IMDUR) 60 MG 24 hr tablet Take 1.5 tablets (90 mg total) by mouth daily., Starting 07/11/2014, Until Discontinued, Normal    lisinopril (PRINIVIL,ZESTRIL) 40 MG tablet Take 1 tablet (40 mg total) by mouth daily., Starting 11/09/2012, Until Discontinued, Normal    metFORMIN (GLUCOPHAGE-XR) 500 MG 24 hr tablet TAKE 1 TABLET EVERY MORNING WITH BREAKFAST, Normal    methocarbamol (ROBAXIN) 500 MG tablet Take 1 tablet (500 mg total) by mouth 3 (three) times daily., Starting 12/30/2014, Until Discontinued, Print    metoCLOPramide (REGLAN) 10 MG tablet Take 1 tablet (10 mg total) by mouth 3 (three) times daily before meals., Starting 04/23/2015, Until Discontinued, Normal    Multiple Vitamins-Minerals (CENTRUM SILVER ADULT 50+ PO) Take 1 tablet by mouth daily., Until Discontinued, Historical Med    NEXIUM 40 MG capsule TAKE 1 CAPSULE DAILY AT NOON, Normal    nitroGLYCERIN (NITROSTAT) 0.4 MG SL tablet Place 1 tablet (0.4 mg total) under the tongue every 5 (five) minutes as needed., Starting 05/10/2013, Until Discontinued, Normal    potassium chloride SA (K-DUR,KLOR-CON) 20 MEQ tablet  Take 1 tablet (20 mEq total) by mouth daily., Starting 04/17/2015, Until Discontinued, Print    rosuvastatin (CRESTOR) 10 MG tablet Take 1 tablet (10 mg total) by mouth daily., Starting 04/28/2015, Until Discontinued, Normal       Allergies  Allergen Reactions  . Sulfa Antibiotics Shortness Of Breath  . Penicillins Rash    Has patient had a PCN reaction causing immediate rash, facial/tongue/throat swelling, SOB or lightheadedness with hypotension: No Has patient had a PCN reaction causing severe rash involving mucus membranes or skin necrosis: No Has patient had a PCN reaction that required hospitalization No Has patient had a PCN reaction occurring within the last 10 years: No If all of the above answers are "NO", then may proceed with Cephalosporin use.     The results of significant diagnostics from this hospitalization (including imaging, microbiology, ancillary and laboratory) are listed below for reference.    Significant Diagnostic Studies: Ct Angio Head W/cm &/or Wo Cm  05/08/2015  CLINICAL DATA:  Acute left eye pain.  Blurred vision EXAM: CT ANGIOGRAPHY HEAD TECHNIQUE: Multidetector CT imaging of the head was performed using the standard protocol during bolus administration of intravenous contrast. Multiplanar CT image reconstructions and MIPs were obtained to evaluate the vascular  anatomy. CONTRAST:  181mL OMNIPAQUE IOHEXOL 350 MG/ML SOLN COMPARISON:  CT head 04/10/2010 FINDINGS: CT HEAD Brain: Ventricle size normal. Cerebral volume normal for age. Negative for acute infarct. Negative for hemorrhage or mass. Calvarium and skull base: Negative Paranasal sinuses: Mild mucosal edema paranasal sinuses. Mastoid sinus clear bilaterally. Orbits: Normal orbit.  No orbital mass or edema. CTA HEAD Anterior circulation: Cavernous carotid appears normal bilaterally without atherosclerotic disease or aneurysm. Anterior and middle cerebral arteries are normal bilaterally. No stenosis or aneurysm.  Posterior circulation: Both vertebral arteries are patent to the basilar. PICA not visualized. AICA patent bilaterally. Basilar patent. Superior cerebellar and posterior cerebral arteries patent bilaterally. Patent right posterior communicating artery. No aneurysm. Venous sinuses: Patent Anatomic variants: None Delayed phase:Normal enhancement on delayed imaging. No enhancing mass lesion. IMPRESSION: Normal CTA of the head. Electronically Signed   By: Franchot Gallo M.D.   On: 05/08/2015 21:03   Microbiology: Recent Results (from the past 240 hour(s))  MRSA PCR Screening     Status: None   Collection Time: 05/08/15 10:40 PM  Result Value Ref Range Status   MRSA by PCR NEGATIVE NEGATIVE Final    Comment:        The GeneXpert MRSA Assay (FDA approved for NASAL specimens only), is one component of a comprehensive MRSA colonization surveillance program. It is not intended to diagnose MRSA infection nor to guide or monitor treatment for MRSA infections.      Labs: Basic Metabolic Panel:  Recent Labs Lab 05/08/15 1652 05/08/15 2122 05/08/15 2342 05/09/15 0311  NA 129* 132* 136 135  K 4.8 4.2 4.0 3.8  CL 98* 99* 102 107  CO2 24 26 27 24   GLUCOSE 685* 404* 156* 171*  BUN 27* 25* 25* 23*  CREATININE 1.31* 1.12 1.15 0.90  CALCIUM 8.4* 8.8* 8.7* 7.9*  MG  --   --  2.4  --    Liver Function Tests:  Recent Labs Lab 05/08/15 1652  AST 21  ALT 24  ALKPHOS 84  BILITOT 0.5  PROT 6.1*  ALBUMIN 3.1*     Recent Labs Lab 05/08/15 1652  WBC 6.9  NEUTROABS 4.5  HGB 14.2  HCT 42.2  MCV 79.9  PLT 210     CBG:  Recent Labs Lab 05/09/15 1116 05/09/15 1230 05/09/15 1451 05/09/15 1555 05/09/15 2135  GLUCAP 207* 168* 227* 253* 302*    Principal Problem:   Type 2 diabetes mellitus with hyperosmolar nonketotic hyperglycemia (HCC) Active Problems:   Essential hypertension   Polysubstance abuse   CAD (coronary artery disease)   AKI (acute kidney injury) ([place])    Hyponatremia   Hypochloremia   Acute left eye pain   Diabetes mellitus due to underlying condition with hyperglycemia ([place])   Time coordinating discharge: 35 minutes   Signed:  Murray Hodgkins, MD [facility] 05/10/2015, 1:39 PM   By signing my name below, I, Rennis Harding attest that this documentation has been prepared under the direction and in the presence of Murray Hodgkins, MD Electronically signed: Rennis Harding  05/10/2015 8:49am   I personally performed the services described in this documentation. All medical record entries made by the scribe were at my direction. I have reviewed the chart and agree that the record reflects my personal performance and is accurate and complete. Murray Hodgkins, MD

## 2015-05-11 LAB — GLUCOSE, CAPILLARY: GLUCOSE-CAPILLARY: 126 mg/dL — AB (ref 65–99)

## 2015-05-11 LAB — HEMOGLOBIN A1C
HEMOGLOBIN A1C: 14.5 % — AB (ref 4.8–5.6)
Mean Plasma Glucose: 369 mg/dL

## 2015-05-12 ENCOUNTER — Telehealth: Payer: Self-pay | Admitting: *Deleted

## 2015-05-12 NOTE — Telephone Encounter (Signed)
Call Completed and Appointment Scheduled: Yes, Date: 05/21/15 with Dr Adria Dill INFORMATION Date of Discharge:05/10/15  Discharge Facility: [facility]  Principal Discharge Diagnosis: DM  Patient and/or caregiver is knowledgeable of his/her condition(s) and treatment: Yes  MEDICATION RECONCILIATION Medication list reviewed with patient:Yes Discharge Medications reconciled with home medications.   Patient is able to obtain needed medications:Yes  ACTIVITIES OF DAILY LIVING  Is the patient able to perform his/her own ADLs: Yes.    Patient is receiving home health services: No.  PATIENT EDUCATION Questions/Concerns Discussed: Discussed changes in insulin. Patient is trying to watch his diet and has decreased the amount of carbs he eats. He will keep a log of his blood sugar readings to bring to his appointment and will call us if he has any problems before he comes in.

## 2015-05-14 ENCOUNTER — Other Ambulatory Visit: Payer: Self-pay | Admitting: Pharmacist

## 2015-05-18 ENCOUNTER — Encounter (INDEPENDENT_AMBULATORY_CARE_PROVIDER_SITE_OTHER): Payer: Self-pay | Admitting: *Deleted

## 2015-05-21 ENCOUNTER — Encounter: Payer: Self-pay | Admitting: Family Medicine

## 2015-05-21 ENCOUNTER — Ambulatory Visit (INDEPENDENT_AMBULATORY_CARE_PROVIDER_SITE_OTHER): Payer: Medicare Other | Admitting: Family Medicine

## 2015-05-21 ENCOUNTER — Telehealth: Payer: Self-pay | Admitting: Family Medicine

## 2015-05-21 VITALS — BP 132/80 | HR 84 | Temp 97.0°F | Ht 69.0 in | Wt 217.6 lb

## 2015-05-21 DIAGNOSIS — I10 Essential (primary) hypertension: Secondary | ICD-10-CM

## 2015-05-21 DIAGNOSIS — E1142 Type 2 diabetes mellitus with diabetic polyneuropathy: Secondary | ICD-10-CM | POA: Diagnosis not present

## 2015-05-21 MED ORDER — ALBUTEROL SULFATE HFA 108 (90 BASE) MCG/ACT IN AERS: 2.0000 | INHALATION_SPRAY | Freq: Four times a day (QID) | RESPIRATORY_TRACT | Status: DC | PRN

## 2015-05-21 NOTE — Telephone Encounter (Signed)
Albuterol inhaler called to drugstore

## 2015-05-21 NOTE — Progress Notes (Signed)
Subjective:    Patient ID: Brad Mendoza, male    DOB: [birth year], 60 y.o.   MRN: [id number]  HPI patient was hospitalized recently for diabetes. He had some visual problems with left eye that improved once his sugar was treated. A1c in the hospital was 14.5 which is consistent with what we have gotten recently. He is now on Levemir and Humalog and says that he is doing better regarding his diabetes. He complains today of some wheezing that he hears when he lays down at night. There is no history of COPD or asthma.  Patient Active Problem List   Diagnosis Date Noted  . Acute left eye pain   . Diabetes mellitus due to underlying condition with hyperglycemia ([place])   . Hyperglycemia 05/08/2015  . Type 2 diabetes mellitus with hyperosmolar nonketotic hyperglycemia ([place]) 05/08/2015  . AKI (acute kidney injury) ([place]) 05/08/2015  . Hyponatremia 05/08/2015  . Hypochloremia 05/08/2015  . Type 2 diabetes mellitus with diabetic polyneuropathy ([place]) 12/16/2013  . CAD (coronary artery disease) 09/18/2013  . Heme positive stool 06/12/2013  . Change in stool 06/12/2013  . Seasonal allergic rhinitis 07/11/2012  . Polysubstance abuse 12/24/2010  . Bronchitis 12/24/2010  . Onychomycosis   . Acid reflux   . Nonischemic cardiomyopathy ([place])   . Type 2 diabetes mellitus with complications ([place])   . Hyperlipemia 11/16/2009  . Obesity 11/16/2009  . NONDEPENDENT TOBACCO USE DISORDER 11/16/2009  . Essential hypertension 11/16/2009  . CHEST PAIN UNSPECIFIED 11/16/2009   Outpatient Encounter Prescriptions as of 05/21/2015  Medication Sig  . amLODipine (NORVASC) 10 MG tablet Take 1 tablet (10 mg total) by mouth daily.  [name] aspirin EC 81 MG tablet Take 81 mg by mouth daily.  . Capsaicin (ICY HOT ARTHRITIS THERAPY EX) Apply 1 application topically 2 (two) times daily.  . carvedilol (COREG) 12.5 MG tablet Take 1 tablet (12.5 mg total) by mouth 2 (two) times daily with a meal.  . fluticasone (FLONASE) 50  MCG/ACT nasal spray USE 2 SPRAYS IN EACH NOSTRIL DAILY  . furosemide (LASIX) 40 MG tablet Take 1 tablet (40 mg total) by mouth daily.  [name] gabapentin (NEURONTIN) 300 MG capsule Take 1 capsule (300 mg total) by mouth 3 (three) times daily. (Patient taking differently: Take 300 mg by mouth at bedtime. )  . HUMALOG KWIKPEN 100 UNIT/ML KiwkPen INJECT UP TO 15 UNITS PRIOR TO EACH MEALAS NEEDED FOR ELAVATED BG  . Insulin Detemir (LEVEMIR FLEXTOUCH) 100 UNIT/ML Pen Inject 20 Units into the skin daily at 10 pm.  . isosorbide mononitrate (IMDUR) 60 MG 24 hr tablet Take 1.5 tablets (90 mg total) by mouth daily.  [name] lisinopril (PRINIVIL,ZESTRIL) 40 MG tablet Take 1 tablet (40 mg total) by mouth daily.  . metFORMIN (GLUCOPHAGE-XR) 500 MG 24 hr tablet TAKE 1 TABLET EVERY MORNING WITH BREAKFAST (Patient taking differently: TAKE 1 TABLET EVERY MORNING WITH BREAKFAST AS NEEDED FOR BLOOD SUGARS OVER 300)  . methocarbamol (ROBAXIN) 500 MG tablet Take 1 tablet (500 mg total) by mouth 3 (three) times daily.  . metoCLOPramide (REGLAN) 10 MG tablet Take 1 tablet (10 mg total) by mouth 3 (three) times daily before meals.  . Multiple Vitamins-Minerals (CENTRUM SILVER ADULT 50+ PO) Take 1 tablet by mouth daily.  [name] NEXIUM 40 MG capsule TAKE 1 CAPSULE DAILY AT NOON  . nitroGLYCERIN (NITROSTAT) 0.4 MG SL tablet Place 1 tablet (0.4 mg total) under the tongue every 5 (five) minutes as needed.  . potassium chloride SA (K-DUR,KLOR-CON)  20 MEQ tablet Take 1 tablet (20 mEq total) by mouth daily.  . rosuvastatin (CRESTOR) 10 MG tablet Take 1 tablet (10 mg total) by mouth daily.   No facility-administered encounter medications on file as of 05/21/2015.      Review of Systems  Constitutional: Negative.   Respiratory: Positive for wheezing.   Cardiovascular: Negative.   Gastrointestinal: Negative.   Neurological: Negative.   Psychiatric/Behavioral: Negative.        Objective:   Physical Exam  Constitutional: He is oriented to  person, place, and time. He appears well-developed and well-nourished.  Cardiovascular: Normal rate, regular rhythm and normal heart sounds.   Pulmonary/Chest: Effort normal and breath sounds normal.  Neurological: He is alert and oriented to person, place, and time.  Psychiatric: He has a normal mood and affect.          Assessment & Plan:  1. Essential hypertension Blood pressure is well controlled today 132/80  2. Type 2 diabetes mellitus with diabetic polyneuropathy, unspecified long term insulin use status ([place]) The patient will do better with his diabetes both in terms of d  [name] MDiet and taking medicine. History has not been very good for this in the past.

## 2015-05-22 NOTE — Telephone Encounter (Signed)
Patient aware.

## 2015-05-26 ENCOUNTER — Telehealth: Payer: Self-pay

## 2015-05-26 NOTE — Telephone Encounter (Signed)
Insurance denied prior authorization for [facility]   Must first try 2 of the following meds  Dexilant and Pantoprazole    Dr Sabra Heck

## 2015-05-26 NOTE — Telephone Encounter (Signed)
I would recommend trying the pantoprazole 40 mg daily

## 2015-05-27 ENCOUNTER — Ambulatory Visit: Payer: Self-pay | Admitting: Pharmacist

## 2015-05-27 MED ORDER — PANTOPRAZOLE SODIUM 40 MG PO TBEC: 40.0000 mg | DELAYED_RELEASE_TABLET | Freq: Every day | ORAL | Status: DC

## 2015-06-16 ENCOUNTER — Ambulatory Visit (INDEPENDENT_AMBULATORY_CARE_PROVIDER_SITE_OTHER): Payer: Medicare Other | Admitting: Internal Medicine

## 2015-06-16 ENCOUNTER — Encounter (INDEPENDENT_AMBULATORY_CARE_PROVIDER_SITE_OTHER): Payer: Self-pay | Admitting: Internal Medicine

## 2015-06-16 VITALS — BP 132/70 | HR 70 | Temp 98.1°F | Ht 69.0 in | Wt 205.2 lb

## 2015-06-16 DIAGNOSIS — K219 Gastro-esophageal reflux disease without esophagitis: Secondary | ICD-10-CM | POA: Diagnosis not present

## 2015-06-16 NOTE — Patient Instructions (Signed)
Continue the Protonix. OV in 1 year.  

## 2015-06-16 NOTE — Progress Notes (Signed)
Subjective:    Patient ID: Brad Mendoza, male    DOB: [birth year], 60 y.o.   MRN: [id number]  HPI  Here today for f/u. HE tells me he is doing pretty good.  He take Protonix daily and helps with his GERD.  He underwent an emptying study which was normal in March. Appetite is good. No weight.  He has a BM daily. No melenaa or BRRB.  No side effects from the Reglan.   Diabetic since 2008.  04/2015 NM Emptying study: normal.   Procedure Date: 04/23/2015 EGD   Indications: Patient is 60 year old Caucasian male with several year history of gastroesophageal reflux disease who is having frequent heartburn regurgitation as well as lump in his throat. He has diabetic and control is suboptimal. Naproxen is one of [place] listed medications but he states he is not taking it. He is undergoing diagnostic EGD.  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 Small sliding hiatal hernia without evidence of erosive esophagitis. Mild portal gastropathy. Biopsies taken.  Gastric biopsy is negative for H. Pylori. Results given to patient. Review of Systems Past Medical History  Diagnosis Date  . CHF (congestive heart failure) ([place])   . HTN (hypertension)   . Acid reflux   . Onychomycosis   . Back pain     f4 and f5  . Diverticulosis   . Hyperlipidemia   . Diabetes mellitus 2005  . H/O chest pain 2005    ECHO  . Hyperlipemia 11/16/2009    Qualifier: Diagnosis of  By: [name]      Past Surgical History  Procedure Laterality Date  . Appendectomy    . Colonoscopy N/A 08/01/2013    Procedure: COLONOSCOPY;  Surgeon: Rogene Houston, MD;  Location: [facility];  Service: Endoscopy;  Laterality: N/A;  rescheduled to [place] notified pt  . Cardiac catheterization  2005    4 frech cath  . Esophagogastroduodenoscopy N/A 04/23/2015      Procedure: ESOPHAGOGASTRODUODENOSCOPY (EGD);  Surgeon: Rogene Houston, MD;  Location: [facility] ENDO SU[facility];  Service: Endoscopy;  Laterality: N/A;  1:25    Allergies  Allergen Reactions  . Sulfa Antibiotics Shortness Of Breath  . Penicillins Rash    Has patient had a PCN reaction causing immediate rash, facial/tongue/throat swelling, SOB or lightheadedness with hypotension: No Has patient had a PCN reaction causing severe rash involving mucus membranes or skin necrosis: No Has patient had a PCN reaction that required hospitalization No Has patient had a PCN reaction occurring within the last 10 years: No If all of the above answers are "NO", then may proceed with Cephalosporin use.     Current Outpatient Prescriptions on File Prior to Visit  Medication Sig Dispense Refill  . albuterol (PROVENTIL HFA;VENTOLIN HFA) 108 (90 Base) MCG/ACT inhaler Inhale 2 puffs into the lungs every 6 (six) hours as needed for wheezing or shortness of breath. 1 Inhaler 2  . amLODipine (NORVASC) 10 MG tablet Take 1 tablet (10 mg total) by mouth daily. 30 tablet 6  . aspirin EC 81 MG tablet Take 81 mg by mouth daily.    . Capsaicin (ICY HOT ARTHRITIS THERAPY EX) Apply 1 application topically 2 (two) times daily.    . carvedilol (COREG) 12.5 MG tablet Take 1 tablet (12.5 mg total) by mouth 2 (two) times daily with a meal. 60 tablet 6  . fluticasone (FLONASE) 50 MCG/ACT nasal spray USE 2 SPRAYS IN [facility]  NOSTRIL DAILY 16 g 5  . furosemide (LASIX) 40 MG tablet Take 1 tablet (40 mg total) by mouth daily. 30 tablet 3  . gabapentin (NEURONTIN) 300 MG capsule Take 1 capsule (300 mg total) by mouth 3 (three) times daily. (Patient taking differently: Take 300 mg by mouth at bedtime. ) 90 capsule 0  . HUMALOG KWIKPEN 100 UNIT/ML KiwkPen INJECT UP TO 15 UNITS PRIOR TO EACH MEALAS NEEDED FOR ELAVATED BG 15 mL 0  . Insulin Detemir (LEVEMIR FLEXTOUCH) 100 UNIT/ML Pen Inject 20 Units into the skin daily at 10 pm.    . isosorbide  mononitrate (IMDUR) 60 MG 24 hr tablet Take 1.5 tablets (90 mg total) by mouth daily. 45 tablet 6  . lisinopril (PRINIVIL,ZESTRIL) 40 MG tablet Take 1 tablet (40 mg total) by mouth daily. 30 tablet 5  . metFORMIN (GLUCOPHAGE-XR) 500 MG 24 hr tablet TAKE 1 TABLET EVERY MORNING WITH BREAKFAST (Patient taking differently: TAKE 1 TABLET EVERY MORNING WITH BREAKFAST AS NEEDED FOR BLOOD SUGARS OVER 300) 30 tablet 2  . methocarbamol (ROBAXIN) 500 MG tablet Take 1 tablet (500 mg total) by mouth 3 (three) times daily. 21 tablet 0  . metoCLOPramide (REGLAN) 10 MG tablet Take 1 tablet (10 mg total) by mouth 3 (three) times daily before meals. 90 tablet 0  . Multiple Vitamins-Minerals (CENTRUM SILVER ADULT 50+ PO) Take 1 tablet by mouth daily.    . nitroGLYCERIN (NITROSTAT) 0.4 MG SL tablet Place 1 tablet (0.4 mg total) under the tongue every 5 (five) minutes as needed. 30 tablet 1  . pantoprazole (PROTONIX) 40 MG tablet Take 1 tablet (40 mg total) by mouth daily. 30 tablet 3  . potassium chloride SA (K-DUR,KLOR-CON) 20 MEQ tablet Take 1 tablet (20 mEq total) by mouth daily. 3 tablet 0  . rosuvastatin (CRESTOR) 10 MG tablet Take 1 tablet (10 mg total) by mouth daily. 30 tablet 6  . [DISCONTINUED] ranitidine (ZANTAC) 150 MG tablet Take 1 tablet (150 mg total) by mouth 2 (two) times daily. (Patient not taking: Reported on 11/24/2014) 90 tablet 1   No current facility-administered medications on file prior to visit.        Objective:   Physical Exam Blood pressure 132/70, pulse 70, temperature 98.1 F (36.7 C), height 5\' 9"  (1.753 m), weight 205 lb 3.2 oz (93.078 kg). Alert and oriented. Skin warm and dry. Oral mucosa is moist.   . Sclera anicteric, conjunctivae is pink. Thyroid not enlarged. No cervical lymphadenopathy. Lungs clear. Heart regular rate and rhythm.  Abdomen is soft. Bowel sounds are positive. No hepatomegaly. No abdominal masses felt. No tenderness.  No edema to lower extremities.           Assessment & Plan:  GERD. For the most part controlled with Protonix. OV 1 year.

## 2015-06-18 ENCOUNTER — Ambulatory Visit: Payer: Medicare Other | Admitting: Family Medicine

## 2015-06-29 ENCOUNTER — Encounter: Payer: Self-pay | Admitting: Cardiovascular Disease

## 2015-06-29 ENCOUNTER — Ambulatory Visit: Payer: Medicare Other | Admitting: Cardiovascular Disease

## 2015-07-01 ENCOUNTER — Telehealth: Payer: Self-pay | Admitting: Pharmacist

## 2015-07-01 NOTE — Telephone Encounter (Signed)
Called and LM on VM - needs appt for F/O diabetes.

## 2015-07-22 ENCOUNTER — Telehealth: Payer: Self-pay | Admitting: Family Medicine

## 2015-08-12 ENCOUNTER — Other Ambulatory Visit: Payer: Self-pay | Admitting: Nurse Practitioner

## 2015-08-12 ENCOUNTER — Other Ambulatory Visit: Payer: Self-pay | Admitting: Pharmacist

## 2015-08-12 ENCOUNTER — Other Ambulatory Visit: Payer: Self-pay | Admitting: Family Medicine

## 2015-09-25 ENCOUNTER — Ambulatory Visit: Payer: Medicare Other | Admitting: Family Medicine

## 2015-10-08 ENCOUNTER — Encounter: Payer: Self-pay | Admitting: Family Medicine

## 2015-10-08 ENCOUNTER — Ambulatory Visit (INDEPENDENT_AMBULATORY_CARE_PROVIDER_SITE_OTHER): Payer: Medicare Other | Admitting: Family Medicine

## 2015-10-08 VITALS — BP 178/99 | HR 87 | Temp 97.7°F | Ht 69.0 in | Wt 190.0 lb

## 2015-10-08 DIAGNOSIS — E118 Type 2 diabetes mellitus with unspecified complications: Secondary | ICD-10-CM

## 2015-10-08 DIAGNOSIS — I428 Other cardiomyopathies: Secondary | ICD-10-CM

## 2015-10-08 DIAGNOSIS — I429 Cardiomyopathy, unspecified: Secondary | ICD-10-CM

## 2015-10-08 DIAGNOSIS — I2510 Atherosclerotic heart disease of native coronary artery without angina pectoris: Secondary | ICD-10-CM

## 2015-10-08 LAB — BAYER DCA HB A1C WAIVED: HB A1C (BAYER DCA - WAIVED): >14 % — ABNORMAL HIGH (ref <7.0)

## 2015-10-08 MED ORDER — NITROGLYCERIN 0.4 MG SL SUBL: 0.4000 mg | SUBLINGUAL_TABLET | SUBLINGUAL | 1 refills | Status: DC | PRN

## 2015-10-08 NOTE — Progress Notes (Signed)
Subjective:    Patient ID: Brad Mendoza, male    DOB: [birth year], 60 y.o.   MRN: [id number]  HPI 60 year old gentleman with poorly controlled diabetes hyperlipidemia. Blood sugars at home have been greater than 400 frequently. Patient admits to not adhering very closely to diabetic diet. He currently takes Levemir 20 units twice a day. We talked about use of long-acting and short-acting insulin. In the near future would like him to work on getting his fasting blood sugars down and I have given him a plan to do that.  Patient Active Problem List   Diagnosis Date Noted  . Acute left eye pain   . Diabetes mellitus due to underlying condition with hyperglycemia ([place])   . Hyperglycemia 05/08/2015  . Type 2 diabetes mellitus with hyperosmolar nonketotic hyperglycemia ([place]) 05/08/2015  . AKI (acute kidney injury) ([place]) 05/08/2015  . Hyponatremia 05/08/2015  . Hypochloremia 05/08/2015  . Type 2 diabetes mellitus with diabetic polyneuropathy ([place]) 12/16/2013  . CAD (coronary artery disease) 09/18/2013  . Heme positive stool 06/12/2013  . Change in stool 06/12/2013  . Seasonal allergic rhinitis 07/11/2012  . Polysubstance abuse 12/24/2010  . Bronchitis 12/24/2010  . Onychomycosis   . Acid reflux   . Nonischemic cardiomyopathy ([place])   . Type 2 diabetes mellitus with complications ([place])   . Hyperlipemia 11/16/2009  . Obesity 11/16/2009  . NONDEPENDENT TOBACCO USE DISORDER 11/16/2009  . Essential hypertension 11/16/2009  . CHEST PAIN UNSPECIFIED 11/16/2009   Outpatient Encounter Prescriptions as of 10/08/2015  Medication Sig  . albuterol (PROVENTIL HFA;VENTOLIN HFA) 108 (90 Base) MCG/ACT inhaler Inhale 2 puffs into the lungs every 6 (six) hours as needed for wheezing or shortness of breath.  [name] amLODipine (NORVASC) 10 MG tablet Take 1 tablet (10 mg total) by mouth daily.  [name] aspirin EC 81 MG tablet Take 81 mg by mouth daily.  . Capsaicin (ICY HOT ARTHRITIS THERAPY EX) Apply 1 application  topically 2 (two) times daily.  . carvedilol (COREG) 12.5 MG tablet Take 1 tablet (12.5 mg total) by mouth 2 (two) times daily with a meal.  . fluticasone (FLONASE) 50 MCG/ACT nasal spray USE 2 SPRAYS IN EACH NOSTRIL DAILY  . furosemide (LASIX) 40 MG tablet Take 1 tablet (40 mg total) by mouth daily.  [name] gabapentin (NEURONTIN) 300 MG capsule Take 1 capsule (300 mg total) by mouth 3 (three) times daily. (Patient taking differently: Take 300 mg by mouth at bedtime. )  . HUMALOG KWIKPEN 100 UNIT/ML KiwkPen INJECT UP TO 15 UNITS PRIOR TO EACH MEALAS NEEDED FOR ELAVATED BG  . isosorbide mononitrate (IMDUR) 60 MG 24 hr tablet Take 1.5 tablets (90 mg total) by mouth daily.  [name] LEVEMIR FLEXTOUCH 100 UNIT/ML Pen INJECT 35 UNITS SQ TWICE DAILY WITH BREAKFAST AND SUPPER  . lisinopril (PRINIVIL,ZESTRIL) 40 MG tablet Take 1 tablet (40 mg total) by mouth daily.  . metFORMIN (GLUCOPHAGE-XR) 500 MG 24 hr tablet TAKE 1 TABLET EVERY MORNING WITH BREAKFAST (Patient taking differently: TAKE 1 TABLET EVERY MORNING WITH BREAKFAST AS NEEDED FOR BLOOD SUGARS OVER 300)  . methocarbamol (ROBAXIN) 500 MG tablet Take 1 tablet (500 mg total) by mouth 3 (three) times daily.  . metoCLOPramide (REGLAN) 10 MG tablet Take 1 tablet (10 mg total) by mouth 3 (three) times daily before meals.  . Multiple Vitamins-Minerals (CENTRUM SILVER ADULT 50+ PO) Take 1 tablet by mouth daily.  . nitroGLYCERIN (NITROSTAT) 0.4 MG SL tablet Place 1 tablet (0.4 mg total) under the tongue every 5 (  five) minutes as needed.  . pantoprazole (PROTONIX) 40 MG tablet Take 1 tablet (40 mg total) by mouth daily.  . potassium chloride SA (K-DUR,KLOR-CON) 20 MEQ tablet Take 1 tablet (20 mEq total) by mouth daily.  . rosuvastatin (CRESTOR) 10 MG tablet Take 1 tablet (10 mg total) by mouth daily.   No facility-administered encounter medications on file as of 10/08/2015.       Review of Systems  Constitutional: Positive for unexpected weight change.    Respiratory: Negative.   Cardiovascular: Positive for chest pain.  Neurological: Negative.   Psychiatric/Behavioral: Negative.        Objective:   Physical Exam  Constitutional: He is oriented to person, place, and time. He appears well-developed and well-nourished.  Cardiovascular: Normal rate, regular rhythm and normal heart sounds.   Pulmonary/Chest: Effort normal and breath sounds normal.  Neurological: He is alert and oriented to person, place, and time.  Psychiatric: He has a normal mood and affect.   BP (!) 178/99   Pulse 87   Temp 97.7 F (36.5 C) (Oral)   Ht _0  (1.753 m)   Wt 190 lb (86.2 kg)   BMI 28.06 kg/m        Assessment & Plan:  1. Atherosclerosis of native coronary artery of native heart without angina pectoris He has occasional chest pain. Nitroglycerin is effective - nitroGLYCERIN (NITROSTAT) 0.4 MG SL tablet; Place 1 tablet (0.4 mg total) under the tongue every 5 (five) minutes as needed.  Dispense: 30 tablet; Refill: 1  2. Nonischemic cardiomyopathy (HCC)  - nitroGLYCERIN (NITROSTAT) 0.4 MG SL tablet; Place 1 tablet (0.4 mg total) under the tongue every 5 (five) minutes as needed.  Dispense: 30 tablet; Refill: 1  3. Type 2 diabetes mellitus with complication, without long-term current use of insulin (HCC) Diabetes is poorly controlled as noted above will work on fasting sugars with the long-acting insulin this next few weeks and then work on short acting insulin at adjusting that depending on his meal intake - Bayer DCA Hb A1c [place] - BMP8+EGFR  [name] MD

## 2015-10-09 ENCOUNTER — Ambulatory Visit: Payer: Medicare Other | Admitting: Family Medicine

## 2015-10-09 LAB — BMP8+EGFR
BUN/Creatinine Ratio: 21 — ABNORMAL HIGH (ref 9–20)
BUN: 21 mg/dL (ref 6–24)
CALCIUM: 8.8 mg/dL (ref 8.7–10.2)
CO2: 23 mmol/L (ref 18–29)
CREATININE: 1.01 mg/dL (ref 0.76–1.27)
Chloride: 91 mmol/L — ABNORMAL LOW (ref 96–106)
GFR calc Af Amer: 94 mL/min/{1.73_m2} (ref >59)
GFR calc non Af Amer: 81 mL/min/{1.73_m2} (ref >59)
GLUCOSE: 593 mg/dL — AB (ref 65–99)
POTASSIUM: 5.2 mmol/L (ref 3.5–5.2)
SODIUM: 130 mmol/L — AB (ref 134–144)

## 2015-10-13 DIAGNOSIS — M79672 Pain in left foot: Secondary | ICD-10-CM | POA: Diagnosis not present

## 2015-10-14 ENCOUNTER — Other Ambulatory Visit: Payer: Self-pay | Admitting: Family Medicine

## 2015-10-14 ENCOUNTER — Encounter: Payer: Self-pay | Admitting: Family Medicine

## 2015-10-17 ENCOUNTER — Encounter (HOSPITAL_COMMUNITY): Payer: Self-pay | Admitting: Emergency Medicine

## 2015-10-17 ENCOUNTER — Emergency Department (HOSPITAL_COMMUNITY)
Admission: EM | Admit: 2015-10-17 | Discharge: 2015-10-17 | Disposition: A | Discharge status: Home / Self Care | Payer: Medicare Other | Attending: Emergency Medicine | Admitting: Emergency Medicine

## 2015-10-17 ENCOUNTER — Emergency Department (HOSPITAL_COMMUNITY): Payer: Medicare Other

## 2015-10-17 DIAGNOSIS — Z7982 Long term (current) use of aspirin: Secondary | ICD-10-CM | POA: Insufficient documentation

## 2015-10-17 DIAGNOSIS — I2510 Atherosclerotic heart disease of native coronary artery without angina pectoris: Secondary | ICD-10-CM | POA: Diagnosis not present

## 2015-10-17 DIAGNOSIS — Z7984 Long term (current) use of oral hypoglycemic drugs: Secondary | ICD-10-CM | POA: Insufficient documentation

## 2015-10-17 DIAGNOSIS — R0602 Shortness of breath: Secondary | ICD-10-CM | POA: Diagnosis not present

## 2015-10-17 DIAGNOSIS — I110 Hypertensive heart disease with heart failure: Secondary | ICD-10-CM | POA: Diagnosis not present

## 2015-10-17 DIAGNOSIS — Z79899 Other long term (current) drug therapy: Secondary | ICD-10-CM | POA: Insufficient documentation

## 2015-10-17 DIAGNOSIS — I509 Heart failure, unspecified: Secondary | ICD-10-CM | POA: Diagnosis not present

## 2015-10-17 DIAGNOSIS — E119 Type 2 diabetes mellitus without complications: Secondary | ICD-10-CM | POA: Diagnosis not present

## 2015-10-17 DIAGNOSIS — Z794 Long term (current) use of insulin: Secondary | ICD-10-CM | POA: Diagnosis not present

## 2015-10-17 DIAGNOSIS — F17290 Nicotine dependence, other tobacco product, uncomplicated: Secondary | ICD-10-CM | POA: Insufficient documentation

## 2015-10-17 LAB — CBC WITH DIFFERENTIAL/PLATELET
BASOS PCT: 0 %
Basophils Absolute: 0 10*3/uL (ref 0.0–0.1)
EOS ABS: 0.1 10*3/uL (ref 0.0–0.7)
Eosinophils Relative: 1 %
HCT: 36.3 % — ABNORMAL LOW (ref 39.0–52.0)
HEMOGLOBIN: 12.1 g/dL — AB (ref 13.0–17.0)
Lymphocytes Relative: 20 %
Lymphs Abs: 1.5 10*3/uL (ref 0.7–4.0)
MCH: 27.9 pg (ref 26.0–34.0)
MCHC: 33.3 g/dL (ref 30.0–36.0)
MCV: 83.6 fL (ref 78.0–100.0)
Monocytes Absolute: 0.9 10*3/uL (ref 0.1–1.0)
Monocytes Relative: 12 %
NEUTROS PCT: 67 %
Neutro Abs: 5.1 10*3/uL (ref 1.7–7.7)
Platelets: 233 10*3/uL (ref 150–400)
RBC: 4.34 MIL/uL (ref 4.22–5.81)
RDW: 13.3 % (ref 11.5–15.5)
WBC: 7.6 10*3/uL (ref 4.0–10.5)

## 2015-10-17 LAB — URINALYSIS, ROUTINE W REFLEX MICROSCOPIC
BILIRUBIN URINE: NEGATIVE
GLUCOSE, UA: NEGATIVE mg/dL
KETONES UR: NEGATIVE mg/dL
LEUKOCYTES UA: NEGATIVE
NITRITE: NEGATIVE
PROTEIN: 100 mg/dL — AB
Specific Gravity, Urine: 1.02 (ref 1.005–1.030)
pH: 6 (ref 5.0–8.0)

## 2015-10-17 LAB — BASIC METABOLIC PANEL
Anion gap: 10 (ref 5–15)
BUN: 18 mg/dL (ref 6–20)
CHLORIDE: 104 mmol/L (ref 101–111)
CO2: 26 mmol/L (ref 22–32)
CREATININE: 1.01 mg/dL (ref 0.61–1.24)
Calcium: 9 mg/dL (ref 8.9–10.3)
GFR calc non Af Amer: >60 mL/min (ref >60)
Glucose, Bld: 88 mg/dL (ref 65–99)
POTASSIUM: 3.4 mmol/L — AB (ref 3.5–5.1)
SODIUM: 140 mmol/L (ref 135–145)

## 2015-10-17 LAB — URINE MICROSCOPIC-ADD ON

## 2015-10-17 LAB — BRAIN NATRIURETIC PEPTIDE: B NATRIURETIC PEPTIDE 5: 581 pg/mL — AB (ref 0.0–100.0)

## 2015-10-17 LAB — TROPONIN I: TROPONIN I: 0.05 ng/mL — AB (ref <0.03)

## 2015-10-17 MED ORDER — POTASSIUM CHLORIDE CRYS ER 20 MEQ PO TBCR: 40.0000 meq | EXTENDED_RELEASE_TABLET | Freq: Every day | ORAL | 0 refills | Status: DC

## 2015-10-17 MED ORDER — POTASSIUM CHLORIDE CRYS ER 20 MEQ PO TBCR
40.0000 meq | EXTENDED_RELEASE_TABLET | Freq: Once | ORAL | Status: AC
  Administered 2015-10-17: 40 meq via ORAL
  Filled 2015-10-17: qty 2

## 2015-10-17 MED ORDER — FUROSEMIDE 10 MG/ML IJ SOLN
40.0000 mg | Freq: Once | INTRAMUSCULAR | Status: AC
  Administered 2015-10-17: 40 mg via INTRAVENOUS
  Filled 2015-10-17: qty 4

## 2015-10-17 NOTE — Discharge Instructions (Signed)
Please double your Lasix for the next 3 days (take it twice a day instead of once) and follow up with your cardiologist for reevaluation.  Also double the potassium amount you were prescribed as it is slightly low today, this also needs to be rechecked either by your PCP or cardiologist.

## 2015-10-17 NOTE — ED Triage Notes (Signed)
Pt reports he has had some swelling in feet and legs for the past week or so with shortness of breath when lying down last 2 nights.

## 2015-10-17 NOTE — ED Provider Notes (Signed)
[facility] DEPT Provider Note   CSN: [id number] Arrival date & time: 10/17/15  [id number]  By signing my name below, I, Royce Macadamia, attest that this documentation has been prepared under the direction and in the presence of Merrily Pew, MD . Electronically Signed: Royce Macadamia, [place]. 10/17/2015. 10:12 AM.   History   Chief Complaint Chief Complaint  Patient presents with  . Shortness of Breath  . Foot Swelling   The history is provided by the patient. No language interpreter was used.     HPI Comments:  Brad Mendoza is a 60 y.o. male with a h/o bronchitis, CHF with EF of 40% who presents to the Emergency Department complaining of SOB beginning two days ago.  He states that he has a dry cough when he lays down and it makes it difficult for him to breath.  He also notes some leg swelling which began a week ago and dysuria.  He took nitroglycerin and used his albuterol inhaler with minimal relief.  He's come to the ED before for CHF exacerbations.  He denies abdominal pain, nausea, vomiting, diaphoresis, and syncope.   Past Medical History:  Diagnosis Date  . Acid reflux   . Back pain    f4 and f5  . CHF (congestive heart failure) ([place])   . CHF (congestive heart failure) ([place])   . Diabetes mellitus 2005  . Diverticulosis   . H/O chest pain 2005   ECHO  . HTN (hypertension)   . Hyperlipemia 11/16/2009   Qualifier: Diagnosis of  By: [name]    . Hyperlipidemia   . Onychomycosis     Patient Active Problem List   Diagnosis Date Noted  . Acute left eye pain   . Diabetes mellitus due to underlying condition with hyperglycemia ([place])   . Hyperglycemia 05/08/2015  . Type 2 diabetes mellitus with hyperosmolar nonketotic hyperglycemia ([place]) 05/08/2015  . AKI (acute kidney injury) ([place]) 05/08/2015  . Hyponatremia 05/08/2015  . Hypochloremia 05/08/2015  . Type 2 diabetes mellitus with diabetic polyneuropathy ([place]) 12/16/2013  . CAD (coronary artery  disease) 09/18/2013  . Heme positive stool 06/12/2013  . Change in stool 06/12/2013  . Seasonal allergic rhinitis 07/11/2012  . Polysubstance abuse 12/24/2010  . Bronchitis 12/24/2010  . Onychomycosis   . Acid reflux   . Nonischemic cardiomyopathy ([place])   . Type 2 diabetes mellitus with complications ([place])   . Hyperlipemia 11/16/2009  . Obesity 11/16/2009  . NONDEPENDENT TOBACCO USE DISORDER 11/16/2009  . Essential hypertension 11/16/2009  . CHEST PAIN UNSPECIFIED 11/16/2009    Past Surgical History:  Procedure Laterality Date  . APPENDECTOMY    . CARDIAC CATHETERIZATION  2005   4 frech cath  . COLONOSCOPY N/A 08/01/2013   Procedure: COLONOSCOPY;  Surgeon: Rogene Houston, MD;  Location: [facility];  Service: Endoscopy;  Laterality: N/A;  rescheduled to [place] notified pt  . ESOPHAGOGASTRODUODENOSCOPY N/A 04/23/2015   Procedure: ESOPHAGOGASTRODUODENOSCOPY (EGD);  Surgeon: Rogene Houston, MD;  Location: [facility];  Service: Endoscopy;  Laterality: N/A;  1:25       Home Medications    Prior to Admission medications   Medication Sig Start Date End Date Taking? Authorizing Provider  albuterol (PROVENTIL HFA;VENTOLIN HFA) 108 (90 Base) MCG/ACT inhaler Inhale 2 puffs into the lungs every 6 (six) hours as needed for wheezing or shortness of breath. 05/21/15  Yes [name], MD  amLODipine (NORVASC) 10 MG tablet Take 1 tablet (10 mg total) by mouth  daily. 04/28/15  Yes Herminio Commons, MD  aspirin EC 81 MG tablet Take 81 mg by mouth daily.   Yes Historical Provider, MD  Capsaicin (ICY HOT ARTHRITIS THERAPY EX) Apply 1 application topically 2 (two) times daily.   Yes Historical Provider, MD  carvedilol (COREG) 12.5 MG tablet Take 1 tablet (12.5 mg total) by mouth 2 (two) times daily with a meal. 12/23/14  Yes Herminio Commons, MD  fluticasone (FLONASE) 50 MCG/ACT nasal spray USE 2 SPRAYS IN [facility] NOSTRIL DAILY 08/12/15  Yes [name], MD  furosemide (LASIX) 40 MG  tablet Take 1 tablet (40 mg total) by mouth daily. 01/31/13  Yes Vernie Shanks, MD  gabapentin (NEURONTIN) 300 MG capsule Take 1 capsule (300 mg total) by mouth 3 (three) times daily. Patient taking differently: Take 300 mg by mouth at bedtime.  06/24/14  Yes [name], MD  HUMALOG KWIKPEN 100 UNIT/ML KiwkPen INJECT UP TO 15 UNITS PRIOR TO EACH MEALAS NEEDED FOR ELAVATED BG 08/12/15  Yes [name], MD  isosorbide mononitrate (IMDUR) 60 MG 24 hr tablet Take 1.5 tablets (90 mg total) by mouth daily. 07/11/14  Yes Herminio Commons, MD  LEVEMIR FLEXTOUCH 100 UNIT/ML Pen INJECT 35 UNITS SQ TWICE DAILY WITH BREAKFAST AND SUPPER 08/12/15  Yes [name], MD  lisinopril (PRINIVIL,ZESTRIL) 40 MG tablet Take 1 tablet (40 mg total) by mouth daily. 11/09/12  Yes Vernie Shanks, MD  metFORMIN (GLUCOPHAGE-XR) 500 MG 24 hr tablet TAKE 1 TABLET EVERY MORNING WITH BREAKFAST Patient taking differently: TAKE 1 TABLET EVERY MORNING WITH BREAKFAST AS NEEDED FOR BLOOD SUGARS OVER 300 12/23/14  Yes Tammy Eckard, PharmD  methocarbamol (ROBAXIN) 500 MG tablet Take 1 tablet (500 mg total) by mouth 3 (three) times daily. 12/30/14  Yes Tammy Triplett, PA-C  metoCLOPramide (REGLAN) 10 MG tablet Take 1 tablet (10 mg total) by mouth 3 (three) times daily before meals. 04/23/15  Yes Rogene Houston, MD  Multiple Vitamins-Minerals (CENTRUM SILVER ADULT 50+ PO) Take 1 tablet by mouth daily.   Yes Historical Provider, MD  nitroGLYCERIN (NITROSTAT) 0.4 MG SL tablet Place 1 tablet (0.4 mg total) under the tongue every 5 (five) minutes as needed. 10/08/15  Yes [name], MD  pantoprazole (PROTONIX) 40 MG tablet Take 1 tablet (40 mg total) by mouth daily. 05/27/15  Yes [name], MD  rosuvastatin (CRESTOR) 10 MG tablet Take 1 tablet (10 mg total) by mouth daily. 04/28/15  Yes Herminio Commons, MD  potassium chloride SA (K-DUR,KLOR-CON) 20 MEQ tablet Take 2 tablets (40 mEq total) by mouth daily. 10/17/15   Merrily Pew, MD    Family History Family History  Problem Relation Age of Onset  . Hypertension Father   . Diabetes Father   . Cancer Mother   . Alcohol abuse Brother   . Colon cancer Neg Hx     Social History Social History  Substance Use Topics  . Smoking status: Never Smoker  . Smokeless tobacco: Current User    Types: Snuff     Comment: dips snuff about 6 cans/week for 3 years  . Alcohol use 0.6 oz/week    1 Cans of beer per week     Comment: occ      Allergies   Sulfa antibiotics and Penicillins   Review of Systems Review of Systems  Constitutional: Negative for diaphoresis.  Respiratory: Positive for cough and shortness of breath.   Gastrointestinal: Negative for abdominal  pain, nausea and vomiting.  Genitourinary: Positive for dysuria.  Neurological: Negative for syncope.  All other systems reviewed and are negative.    Physical Exam Updated Vital Signs BP 144/87   Pulse 78   Temp 98.6 F (37 C) (Oral)   Resp 26   Ht 5\' 9"  (1.753 m)   Wt 200 lb (90.7 kg)   SpO2 98%   BMI 29.53 kg/m   Physical Exam  Constitutional: He is oriented to person, place, and time. He appears well-developed and well-nourished. No distress.  HENT:  Head: Normocephalic and atraumatic.  Eyes: Conjunctivae are normal.  Cardiovascular: Normal rate.   1+ edema to his feet; His left feels better than his right. No JVD   Pulmonary/Chest: Effort normal.  Minimal bilateral basal rales  Abdominal:  No CVA tenderness.   Musculoskeletal:  No midline back tenderness.  Neurological: He is alert and oriented to person, place, and time.  Skin: Skin is warm and dry.  Psychiatric: He has a normal mood and affect.  Nursing note and vitals reviewed.    ED Treatments / Results   DIAGNOSTIC STUDIES:  Oxygen Saturation is 99% on RA, nml by my interpretation.    COORDINATION OF CARE:  10:12 AM Discussed treatment plan with pt at bedside and pt agreed to plan.  Labs (all labs  ordered are listed, but only abnormal results are displayed) Labs Reviewed  CBC WITH DIFFERENTIAL/PLATELET - Abnormal; Notable for the following:       Result Value   Hemoglobin 12.1 (*)    HCT 36.3 (*)    All other components within normal limits  BASIC METABOLIC PANEL - Abnormal; Notable for the following:    Potassium 3.4 (*)    All other components within normal limits  TROPONIN I - Abnormal; Notable for the following:    Troponin I 0.05 (*)    All other components within normal limits  BRAIN NATRIURETIC PEPTIDE - Abnormal; Notable for the following:    B Natriuretic Peptide 581.0 (*)    All other components within normal limits  URINALYSIS, ROUTINE W REFLEX MICROSCOPIC (NOT AT [facility]) - Abnormal; Notable for the following:    Hgb urine dipstick SMALL (*)    Protein, ur 100 (*)    All other components within normal limits  URINE MICROSCOPIC-ADD ON - Abnormal; Notable for the following:    Squamous Epithelial / LPF 6-30 (*)    Bacteria, UA RARE (*)    All other components within normal limits    EKG  EKG Interpretation  Date/Time:  Saturday October 17 2015 09:50:31 EDT Ventricular Rate:  82 PR Interval:    QRS Duration: 89 QT Interval:  397 QTC Calculation: 464 R Axis:   -8 Text Interpretation:  Sinus rhythm Abnormal R-wave progression, early transition Nonspecific T abnormalities, lateral leads No significant change since last tracing Confirmed by [facility] MD, Corene Cornea [phone]) on 10/17/2015 10:00:24 AM       Radiology Dg Chest 2 View  Result Date: 10/17/2015 CLINICAL DATA:  Swelling in lower extremities for the past week. Shortness of breath. EXAM: CHEST  2 VIEW COMPARISON:  04/17/2015 FINDINGS: Heart is borderline in size. Small right pleural effusion. Minimal right base atelectasis. Left lung is clear. No edema. No acute bony abnormality. IMPRESSION: Borderline heart size. Small right pleural effusion with right base atelectasis. Electronically Signed   By: Rolm Baptise M.D.    On: 10/17/2015 10:32    Procedures Procedures (including critical care time)  Medications  Ordered in ED Medications  furosemide (LASIX) injection 40 mg (40 mg Intravenous Given 10/17/15 1103)  potassium chloride SA (K-DUR,KLOR-CON) CR tablet 40 mEq (40 mEq Oral Given 10/17/15 1150)     Initial Impression / Assessment and Plan / ED Course  I have reviewed the triage vital signs and the nursing notes.  Pertinent labs & imaging results that were available during my care of the patient were reviewed by me and considered in my medical decision making (see chart for details).  Clinical Course    Exam/labs/imaging c/w fluid overload but no significant respiratory distress or evidence of acute ischemia. IV lasix given here with some diuresis. Will also double home lasix for the next few days and follow up with cardiology.   Final Clinical Impressions(s) / ED Diagnoses   Final diagnoses:  Acute on chronic congestive heart failure, unspecified congestive heart failure type [facility])    New Prescriptions Current Discharge Medication List     I personally performed the services described in this documentation, which was scribed in my presence. The recorded information has been reviewed and is accurate.    Merrily Pew, MD 10/17/15 1239

## 2015-10-19 ENCOUNTER — Telehealth: Payer: Self-pay | Admitting: Family Medicine

## 2015-10-19 NOTE — Telephone Encounter (Signed)
Patient called stating that he was to have a Rx for hydrocodone sent to his pharmacy.  No documentation in chart for Hydrocodone.  Patient aware that Rx will need to be printed.  Patient has a hospital follow up on Friday 10/23/2015

## 2015-10-20 ENCOUNTER — Other Ambulatory Visit: Payer: Self-pay | Admitting: Family Medicine

## 2015-10-22 NOTE — Progress Notes (Signed)
Subjective:    Patient ID: Brad Mendoza, male    DOB: Mar 01, 1955, 60 y.o.   MRN: [id number]  HPI 60 year old gentleman with history of congestive heart failure. He was seen in the emergency room recently for acute on chronic congestive heart failure with shortness of breath and dependent edema. He was diuresed in the emergency room with IV Lasix and sent home with Lasix to take for 5 days. Symptoms have improved. He still has some dependent edema but some of that especially in the left foot is related to an injury. In the emergency room his potassium was minimally low at 3.4  Patient Active Problem List   Diagnosis Date Noted  . Acute left eye pain   . Diabetes mellitus due to underlying condition with hyperglycemia ([place])   . Hyperglycemia 05/08/2015  . Type 2 diabetes mellitus with hyperosmolar nonketotic hyperglycemia ([place]) 05/08/2015  . AKI (acute kidney injury) ([place]) 05/08/2015  . Hyponatremia 05/08/2015  . Hypochloremia 05/08/2015  . Type 2 diabetes mellitus with diabetic polyneuropathy ([place]) 12/16/2013  . CAD (coronary artery disease) 09/18/2013  . Heme positive stool 06/12/2013  . Change in stool 06/12/2013  . Seasonal allergic rhinitis 07/11/2012  . Polysubstance abuse 12/24/2010  . Bronchitis 12/24/2010  . Onychomycosis   . Acid reflux   . Nonischemic cardiomyopathy ([place])   . Type 2 diabetes mellitus with complications ([place])   . Hyperlipemia 11/16/2009  . Obesity 11/16/2009  . NONDEPENDENT TOBACCO USE DISORDER 11/16/2009  . Essential hypertension 11/16/2009  . CHEST PAIN UNSPECIFIED 11/16/2009   Outpatient Encounter Prescriptions as of 10/23/2015  Medication Sig  . albuterol (PROVENTIL HFA;VENTOLIN HFA) 108 (90 Base) MCG/ACT inhaler Inhale 2 puffs into the lungs every 6 (six) hours as needed for wheezing or shortness of breath.  [name] amLODipine (NORVASC) 10 MG tablet Take 1 tablet (10 mg total) by mouth daily.  [name] aspirin EC 81 MG tablet Take 81 mg by mouth daily.  .  Capsaicin (ICY HOT ARTHRITIS THERAPY EX) Apply 1 application topically 2 (two) times daily.  . carvedilol (COREG) 12.5 MG tablet Take 1 tablet (12.5 mg total) by mouth 2 (two) times daily with a meal.  . fluticasone (FLONASE) 50 MCG/ACT nasal spray USE 2 SPRAYS IN EACH NOSTRIL DAILY  . furosemide (LASIX) 40 MG tablet Take 1 tablet (40 mg total) by mouth daily.  . furosemide (LASIX) 40 MG tablet TAKE 1 TABLET TWICE A DAY FOR 3 DAYS  . gabapentin (NEURONTIN) 300 MG capsule Take 1 capsule (300 mg total) by mouth 3 (three) times daily. (Patient taking differently: Take 300 mg by mouth at bedtime. )  . HUMALOG KWIKPEN 100 UNIT/ML KiwkPen INJECT UP TO 15 UNITS PRIOR TO EACH MEALAS NEEDED FOR ELAVATED BG  . isosorbide mononitrate (IMDUR) 60 MG 24 hr tablet Take 1.5 tablets (90 mg total) by mouth daily.  [name] LEVEMIR FLEXTOUCH 100 UNIT/ML Pen INJECT 35 UNITS SQ TWICE DAILY WITH BREAKFAST AND SUPPER  . lisinopril (PRINIVIL,ZESTRIL) 40 MG tablet Take 1 tablet (40 mg total) by mouth daily.  . metFORMIN (GLUCOPHAGE-XR) 500 MG 24 hr tablet TAKE 1 TABLET EVERY MORNING WITH BREAKFAST (Patient taking differently: TAKE 1 TABLET EVERY MORNING WITH BREAKFAST AS NEEDED FOR BLOOD SUGARS OVER 300)  . methocarbamol (ROBAXIN) 500 MG tablet Take 1 tablet (500 mg total) by mouth 3 (three) times daily.  . metoCLOPramide (REGLAN) 10 MG tablet Take 1 tablet (10 mg total) by mouth 3 (three) times daily before meals.  . Multiple Vitamins-Minerals (CENTRUM  SILVER ADULT 50+ PO) Take 1 tablet by mouth daily.  . nitroGLYCERIN (NITROSTAT) 0.4 MG SL tablet Place 1 tablet (0.4 mg total) under the tongue every 5 (five) minutes as needed.  . pantoprazole (PROTONIX) 40 MG tablet Take 1 tablet (40 mg total) by mouth daily.  . potassium chloride SA (K-DUR,KLOR-CON) 20 MEQ tablet Take 2 tablets (40 mEq total) by mouth daily.  . rosuvastatin (CRESTOR) 10 MG tablet Take 1 tablet (10 mg total) by mouth daily.   No facility-administered encounter  medications on file as of 10/23/2015.       Review of Systems  Constitutional: Negative.   Respiratory: Positive for shortness of breath.   Cardiovascular: Positive for leg swelling.  Neurological: Negative.   Psychiatric/Behavioral: Negative.        Objective:   Physical Exam  Constitutional: He is oriented to person, place, and time. He appears well-developed and well-nourished.  Cardiovascular: Normal rate, regular rhythm and normal heart sounds.   Pulmonary/Chest: Effort normal and breath sounds normal.  Neurological: He is alert and oriented to person, place, and time.  Psychiatric: He has a normal mood and affect. His behavior is normal.   BP 122/86 (BP Location: Right Arm, Patient Position: Sitting, Cuff Size: Large)   Pulse 90   Temp 97.9 F (36.6 C) (Oral)   Ht 5' 9"  (1.753 m)   Wt 198 lb 12.8 oz (90.2 kg)   BMI 29.36 kg/m         Assessment & Plan:  1. Essential hypertension Blood pressure is well controlled at 122/86 - CMP14+EGFR - lisinopril (PRINIVIL,ZESTRIL) 40 MG tablet; Take 1 tablet (40 mg total) by mouth daily.  Dispense: 30 tablet; Refill: 5  2. Type 2 diabetes mellitus with complication, without long-term current use of insulin (HCC)  - Bayer DCA Hb A1c Waived - Microalbumin / creatinine urine ratio  3. Hyperlipemia  - Lipid panel  4. Coronary artery disease due to lipid rich plaque We'll continue Lasix with directions to take if weight is greater than 3 pounds comparing present day to previous day or there is increasing shortness of breath and orthopnea.  [name] MD

## 2015-10-23 ENCOUNTER — Encounter: Payer: Self-pay | Admitting: Family Medicine

## 2015-10-23 ENCOUNTER — Ambulatory Visit (INDEPENDENT_AMBULATORY_CARE_PROVIDER_SITE_OTHER): Payer: Medicare Other | Admitting: Family Medicine

## 2015-10-23 VITALS — BP 122/86 | HR 90 | Temp 97.9°F | Ht 69.0 in | Wt 198.8 lb

## 2015-10-23 DIAGNOSIS — E785 Hyperlipidemia, unspecified: Secondary | ICD-10-CM | POA: Diagnosis not present

## 2015-10-23 DIAGNOSIS — I10 Essential (primary) hypertension: Secondary | ICD-10-CM | POA: Diagnosis not present

## 2015-10-23 DIAGNOSIS — I2583 Coronary atherosclerosis due to lipid rich plaque: Secondary | ICD-10-CM

## 2015-10-23 DIAGNOSIS — E118 Type 2 diabetes mellitus with unspecified complications: Secondary | ICD-10-CM

## 2015-10-23 DIAGNOSIS — I2510 Atherosclerotic heart disease of native coronary artery without angina pectoris: Secondary | ICD-10-CM

## 2015-10-23 MED ORDER — CARVEDILOL 12.5 MG PO TABS: 12.5000 mg | ORAL_TABLET | Freq: Two times a day (BID) | ORAL | 6 refills | Status: DC

## 2015-10-23 MED ORDER — GABAPENTIN 300 MG PO CAPS: 300.0000 mg | ORAL_CAPSULE | Freq: Three times a day (TID) | ORAL | 0 refills | Status: DC

## 2015-10-23 MED ORDER — ISOSORBIDE MONONITRATE ER 60 MG PO TB24: 90.0000 mg | ORAL_TABLET | Freq: Every day | ORAL | 6 refills | Status: DC

## 2015-10-23 MED ORDER — FUROSEMIDE 40 MG PO TABS: 40.0000 mg | ORAL_TABLET | Freq: Every day | ORAL | 3 refills | Status: DC

## 2015-10-23 MED ORDER — INSULIN DETEMIR 100 UNIT/ML FLEXPEN: PEN_INJECTOR | SUBCUTANEOUS | 0 refills | Status: DC

## 2015-10-23 MED ORDER — FUROSEMIDE 40 MG PO TABS: ORAL_TABLET | ORAL | 3 refills | Status: DC

## 2015-10-23 MED ORDER — ROSUVASTATIN CALCIUM 10 MG PO TABS: 10.0000 mg | ORAL_TABLET | Freq: Every day | ORAL | 6 refills | Status: DC

## 2015-10-23 MED ORDER — POTASSIUM CHLORIDE CRYS ER 20 MEQ PO TBCR: EXTENDED_RELEASE_TABLET | ORAL | 0 refills | Status: DC

## 2015-10-23 MED ORDER — LISINOPRIL 40 MG PO TABS: 40.0000 mg | ORAL_TABLET | Freq: Every day | ORAL | 5 refills | Status: DC

## 2015-10-23 MED ORDER — PANTOPRAZOLE SODIUM 40 MG PO TBEC: 40.0000 mg | DELAYED_RELEASE_TABLET | Freq: Every day | ORAL | 3 refills | Status: DC

## 2015-10-23 MED ORDER — AMLODIPINE BESYLATE 10 MG PO TABS: 10.0000 mg | ORAL_TABLET | Freq: Every day | ORAL | 6 refills | Status: DC

## 2015-10-23 MED ORDER — INSULIN LISPRO 100 UNIT/ML (KWIKPEN): PEN_INJECTOR | SUBCUTANEOUS | 0 refills | Status: DC

## 2015-10-23 NOTE — Addendum Note (Signed)
Addended by: Jamelle Haring on: 10/23/2015 02:32 PM   Modules accepted: Orders

## 2015-10-24 LAB — MICROALBUMIN / CREATININE URINE RATIO
Creatinine, Urine: 83.7 mg/dL
MICROALB/CREAT RATIO: 3478.1 mg/g creat — ABNORMAL HIGH (ref 0.0–30.0)
MICROALBUM., U, RANDOM: 2911.2 ug/mL

## 2015-10-24 LAB — CMP14+EGFR
A/G RATIO: 1.2 (ref 1.2–2.2)
ALBUMIN: 2.9 g/dL — AB (ref 3.5–5.5)
ALK PHOS: 121 IU/L — AB (ref 39–117)
ALT: 14 IU/L (ref 0–44)
AST: 15 IU/L (ref 0–40)
BILIRUBIN TOTAL: 0.2 mg/dL (ref 0.0–1.2)
BUN / CREAT RATIO: 20 (ref 9–20)
BUN: 23 mg/dL (ref 6–24)
CO2: 25 mmol/L (ref 18–29)
Calcium: 8.5 mg/dL — ABNORMAL LOW (ref 8.7–10.2)
Chloride: 99 mmol/L (ref 96–106)
Creatinine, Ser: 1.14 mg/dL (ref 0.76–1.27)
GFR calc Af Amer: 81 mL/min/{1.73_m2} (ref >59)
GFR calc non Af Amer: 70 mL/min/{1.73_m2} (ref >59)
GLOBULIN, TOTAL: 2.4 g/dL (ref 1.5–4.5)
Glucose: 315 mg/dL — ABNORMAL HIGH (ref 65–99)
POTASSIUM: 4.4 mmol/L (ref 3.5–5.2)
SODIUM: 138 mmol/L (ref 134–144)
Total Protein: 5.3 g/dL — ABNORMAL LOW (ref 6.0–8.5)

## 2015-10-24 LAB — LIPID PANEL
CHOLESTEROL TOTAL: 155 mg/dL (ref 100–199)
Chol/HDL Ratio: 3.5 ratio units (ref 0.0–5.0)
HDL: 44 mg/dL (ref >39)
LDL Calculated: 83 mg/dL (ref 0–99)
TRIGLYCERIDES: 140 mg/dL (ref 0–149)
VLDL Cholesterol Cal: 28 mg/dL (ref 5–40)

## 2015-10-30 ENCOUNTER — Encounter: Payer: Self-pay | Admitting: Adult Health

## 2015-10-30 ENCOUNTER — Ambulatory Visit (INDEPENDENT_AMBULATORY_CARE_PROVIDER_SITE_OTHER): Payer: Medicare Other | Admitting: Adult Health

## 2015-10-30 VITALS — BP 144/82 | HR 87 | Ht 69.0 in | Wt 207.0 lb

## 2015-10-30 DIAGNOSIS — I10 Essential (primary) hypertension: Secondary | ICD-10-CM | POA: Diagnosis not present

## 2015-10-30 DIAGNOSIS — Z79899 Other long term (current) drug therapy: Secondary | ICD-10-CM

## 2015-10-30 DIAGNOSIS — I428 Other cardiomyopathies: Secondary | ICD-10-CM

## 2015-10-30 DIAGNOSIS — I429 Cardiomyopathy, unspecified: Secondary | ICD-10-CM

## 2015-10-30 DIAGNOSIS — I2510 Atherosclerotic heart disease of native coronary artery without angina pectoris: Secondary | ICD-10-CM

## 2015-10-30 MED ORDER — FUROSEMIDE 40 MG PO TABS: 60.0000 mg | ORAL_TABLET | Freq: Every day | ORAL | 6 refills | Status: DC

## 2015-10-30 MED ORDER — FUROSEMIDE 40 MG PO TABS: 40.0000 mg | ORAL_TABLET | Freq: Every day | ORAL | 3 refills | Status: DC

## 2015-10-30 MED ORDER — POTASSIUM CHLORIDE CRYS ER 20 MEQ PO TBCR: EXTENDED_RELEASE_TABLET | ORAL | 0 refills | Status: DC

## 2015-10-30 NOTE — Patient Instructions (Addendum)
Your physician recommends that you schedule a follow-up appointment in: 3 Months with Dr. Bronson Ing  Your physician has recommended you make the following change in your medication: Increase Lasix to 60 mg Daily Take Potassium 20 mEq Daily   Your physician recommends that you weigh, daily, at the same time every day, and in the same amount of clothing. Please record your daily weights on the handout provided and bring it to your next appointment.  Your physician recommends that you return for lab work in: 1 Week ( 11/06/15)    If you need a refill on your cardiac medications before your next appointment, please call your pharmacy.  Thank you for choosing [facility]!

## 2015-10-30 NOTE — Progress Notes (Signed)
Cardiology Office Note   Date:  10/30/2015   ID:  Brad Mendoza, DOB [birth year], MRN [id number]  PCP:  [name], MD  Cardiologist: [name], NP   Chief Complaint  Patient presents with  . Congestive Heart Failure  . Coronary Artery Disease      History of Present Illness: Brad Mendoza is a 60 y.o. male who presents for ongoing assessment and management of chronic systolic heart failure, coronary artery disease, hypertension, hyperlipidemia. Patient has a history of nonischemic cardiomyopathy.   Most recent echocardiogram in May 2016 revealed mild to moderately reduced left ventricle systolic function, EF 3007% with grade 1 diastolic dysfunction, mild atrial enlargement.  Nuclear stress testing in October 2014 demonstrated anterior wall and anteroseptal infarcts with no suggestion of ischemia.   On last office visit he was symptomatically stable. Amlodipine was increased to 10 mg daily for blood pressure control and to help with antianginal affect. His Lasix uses increased to 40 mg twice a day for 3 days and then resume 40 mg daily with supplemental potassium. He was counseled on smoking cessation.  The patient unfortunately was seen in the emergency room with fluid retention. He admitted to eating salty foods. He was given IV Lasix 40 mg and had good diuresis. He continues to use Lasix daily but has been using a salt substitute. Something called low salt, also eating salty foods and snacks at home. He is not weighing himself daily, and has not taken his fluid medication today.    Past Medical History:  Diagnosis Date  . Acid reflux   . Back pain    f4 and f5  . CHF (congestive heart failure) ([place])   . CHF (congestive heart failure) ([place])   . Diabetes mellitus 2005  . Diverticulosis   . H/O chest pain 2005   ECHO  . HTN (hypertension)   . Hyperlipemia 11/16/2009   Qualifier: Diagnosis of  By: [name]    . Hyperlipidemia   . Onychomycosis      Past Surgical History:  Procedure Laterality Date  . APPENDECTOMY    . CARDIAC CATHETERIZATION  2005   4 frech cath  . COLONOSCOPY N/A 08/01/2013   Procedure: COLONOSCOPY;  Surgeon: Brad Houston, MD;  Location: [facility];  Service: Endoscopy;  Laterality: N/A;  rescheduled to [place] notified pt  . ESOPHAGOGASTRODUODENOSCOPY N/A 04/23/2015   Procedure: ESOPHAGOGASTRODUODENOSCOPY (EGD);  Surgeon: Brad Houston, MD;  Location: [facility];  Service: Endoscopy;  Laterality: N/A;  1:25     Current Outpatient Prescriptions  Medication Sig Dispense Refill  . albuterol (PROVENTIL HFA;VENTOLIN HFA) 108 (90 Base) MCG/ACT inhaler Inhale 2 puffs into the lungs every 6 (six) hours as needed for wheezing or shortness of breath. 1 Inhaler 2  . amLODipine (NORVASC) 10 MG tablet Take 1 tablet (10 mg total) by mouth daily. 30 tablet 6  . aspirin EC 81 MG tablet Take 81 mg by mouth daily.    . Capsaicin (ICY HOT ARTHRITIS THERAPY EX) Apply 1 application topically 2 (two) times daily.    . carvedilol (COREG) 12.5 MG tablet Take 1 tablet (12.5 mg total) by mouth 2 (two) times daily with a meal. 60 tablet 6  . fluticasone (FLONASE) 50 MCG/ACT nasal spray USE 2 SPRAYS IN EACH NOSTRIL DAILY 16 g 2  . gabapentin (NEURONTIN) 300 MG capsule Take 1 capsule (300 mg total) by mouth 3 (three) times daily. 90 capsule 0  . Insulin  Detemir (LEVEMIR FLEXTOUCH) 100 UNIT/ML Pen INJECT 35 UNITS SQ TWICE DAILY WITH BREAKFAST AND SUPPER 30 mL 0  . insulin lispro (HUMALOG KWIKPEN) 100 UNIT/ML KiwkPen INJECT UP TO 15 UNITS PRIOR TO EACH MEALAS NEEDED FOR ELAVATED BG 15 mL 0  . isosorbide mononitrate (IMDUR) 60 MG 24 hr tablet Take 1.5 tablets (90 mg total) by mouth daily. 45 tablet 6  . lisinopril (PRINIVIL,ZESTRIL) 40 MG tablet Take 1 tablet (40 mg total) by mouth daily. 30 tablet 5  . metFORMIN (GLUCOPHAGE-XR) 500 MG 24 hr tablet TAKE 1 TABLET EVERY MORNING WITH BREAKFAST (Patient taking differently: TAKE 1 TABLET  EVERY MORNING WITH BREAKFAST AS NEEDED FOR BLOOD SUGARS OVER 300) 30 tablet 2  . methocarbamol (ROBAXIN) 500 MG tablet Take 1 tablet (500 mg total) by mouth 3 (three) times daily. 21 tablet 0  . metoCLOPramide (REGLAN) 10 MG tablet Take 1 tablet (10 mg total) by mouth 3 (three) times daily before meals. 90 tablet 0  . Multiple Vitamins-Minerals (CENTRUM SILVER ADULT 50+ PO) Take 1 tablet by mouth daily.    . nitroGLYCERIN (NITROSTAT) 0.4 MG SL tablet Place 1 tablet (0.4 mg total) under the tongue every 5 (five) minutes as needed. 30 tablet 1  . pantoprazole (PROTONIX) 40 MG tablet Take 1 tablet (40 mg total) by mouth daily. 30 tablet 3  . potassium chloride SA (K-DUR,KLOR-CON) 20 MEQ tablet Take as needed when need to take furosemide 3 tablet 0  . rosuvastatin (CRESTOR) 10 MG tablet Take 1 tablet (10 mg total) by mouth daily. 30 tablet 6  . furosemide (LASIX) 40 MG tablet Take 1.5 tablets (60 mg total) by mouth daily. 45 tablet 6   No current facility-administered medications for this visit.     Allergies:   Sulfa antibiotics and Penicillins    Social History:  The patient  reports that he has never smoked. His smokeless tobacco use includes Snuff. He reports that he drinks about 0.6 oz of alcohol per week . He reports that he uses drugs, including Marijuana.   Family History:  The patient's family history includes Alcohol abuse in his brother; Cancer in his mother; Diabetes in his father; Hypertension in his father.    ROS: All other systems are reviewed and negative. Unless otherwise mentioned in H&P    PHYSICAL EXAM: VS:  BP (!) 144/82   Pulse 87   Ht 5\' 9"  (1.753 m)   Wt 207 lb (93.9 kg)   SpO2 98%   BMI 30.57 kg/m  , BMI Body mass index is 30.57 kg/m. GEN: Well nourished, well developed, in no acute distress  HEENT: normal  Neck: no JVD, carotid bruits, or masses Cardiac: RRR; no murmurs, rubs, or gallops, 2+ edema  Respiratory:  clear to auscultation bilaterally, normal  work of breathing GI: soft, nontender, nondistended, + BS MS: no deformity or atrophy  Skin: warm and dry, no rash Neuro:  Strength and sensation are intact Psych: euthymic mood, full affect  Recent Labs: 05/08/2015: Magnesium 2.4 10/17/2015: B Natriuretic Peptide 581.0; Hemoglobin 12.1; Platelets 233 10/23/2015: ALT 14; BUN 23; Creatinine, Ser 1.14; Potassium 4.4; Sodium 138    Lipid Panel    Component Value Date/Time   CHOL 155 10/23/2015 1109   CHOL 277 (H) 07/11/2012 1308   TRIG 140 10/23/2015 1109   TRIG 658 (HH) 12/16/2013 1459   TRIG 522 (H) 07/11/2012 1308   HDL 44 10/23/2015 1109   HDL 42 12/16/2013 1459   HDL 39 (L) 07/11/2012 1308  CHOLHDL 3.5 10/23/2015 1109   CHOLHDL 4.6 04/06/2010 0610   VLDL 73 (H) 04/06/2010 0610   LDLCALC 83 10/23/2015 1109   LDLCALC 144 (H) 05/10/2013 1225   LDLCALC 134 (H) 07/11/2012 1308      Wt Readings from Last 3 Encounters:  10/30/15 207 lb (93.9 kg)  10/23/15 198 lb 12.8 oz (90.2 kg)  10/17/15 200 lb (90.7 kg)      ASSESSMENT AND PLAN:  1.  Chronic systolic heart failure: Recent ER visit with fluid retention requiring IV Lasix 40 mg. He did diurese. His weight is up approximately 5 pounds since being seen in the ER. I will increase his Lasix to 60 mg daily from 40 mg daily. He is advised on a low-sodium diet and to weigh himself daily and to buy a scale. I have given him a weight recording sheet. He is to weigh himself and monitor.  2. Hypertension: Blood pressure is currently controlled. Continue amlodipine carvedilol Lasix as described above and lisinopril. He will need a BMET in one week.  3. CAD: No complaints of chest pain currently. Continue beta blocker and isosorbide.    Current medicines are reviewed at length with the patient today.    Labs/ tests ordered today include: BMET   Orders Placed This Encounter  Procedures  . Basic Metabolic Panel (BMET)     Disposition:   FU with 3 months  Signed, Jory Sims, NP  10/30/2015 3:52 PM    [facility] [address] [place], [place] [address] Phone: [phone]; Fax: [phone]

## 2015-10-30 NOTE — Progress Notes (Signed)
Name: Brad Mendoza    DOB: 1955/04/02  Age: 60 y.o.  MR#: [id number]       PCP:  [name], MD      Insurance: Payor: MEDICARE / Plan: MEDICARE PART A AND B / Product Type: *No Product type* /   CC:   No chief complaint on file.   VS Vitals:   10/30/15 1439  Weight: 207 lb (93.9 kg)  Height: 5\' 9"  (1.753 m)    Weights Current Weight  10/30/15 207 lb (93.9 kg)  10/23/15 198 lb 12.8 oz (90.2 kg)  10/17/15 200 lb (90.7 kg)    Blood Pressure  BP Readings from Last 3 Encounters:  10/23/15 122/86  10/17/15 151/98  10/08/15 (!) 178/99     Admit date:  (Not on file) Last encounter with RMR:  Visit date not found   Allergy Sulfa antibiotics and Penicillins  Current Outpatient Prescriptions  Medication Sig Dispense Refill  . albuterol (PROVENTIL HFA;VENTOLIN HFA) 108 (90 Base) MCG/ACT inhaler Inhale 2 puffs into the lungs every 6 (six) hours as needed for wheezing or shortness of breath. 1 Inhaler 2  . amLODipine (NORVASC) 10 MG tablet Take 1 tablet (10 mg total) by mouth daily. 30 tablet 6  . aspirin EC 81 MG tablet Take 81 mg by mouth daily.    . Capsaicin (ICY HOT ARTHRITIS THERAPY EX) Apply 1 application topically 2 (two) times daily.    . carvedilol (COREG) 12.5 MG tablet Take 1 tablet (12.5 mg total) by mouth 2 (two) times daily with a meal. 60 tablet 6  . fluticasone (FLONASE) 50 MCG/ACT nasal spray USE 2 SPRAYS IN EACH NOSTRIL DAILY 16 g 2  . furosemide (LASIX) 40 MG tablet Take as need for weight gain more than 3 pounds a day or increase in swelling 30 tablet 3  . gabapentin (NEURONTIN) 300 MG capsule Take 1 capsule (300 mg total) by mouth 3 (three) times daily. 90 capsule 0  . Insulin Detemir (LEVEMIR FLEXTOUCH) 100 UNIT/ML Pen INJECT 35 UNITS SQ TWICE DAILY WITH BREAKFAST AND SUPPER 30 mL 0  . insulin lispro (HUMALOG KWIKPEN) 100 UNIT/ML KiwkPen INJECT UP TO 15 UNITS PRIOR TO EACH MEALAS NEEDED FOR ELAVATED BG 15 mL 0  . isosorbide mononitrate (IMDUR) 60 MG 24 hr  tablet Take 1.5 tablets (90 mg total) by mouth daily. 45 tablet 6  . lisinopril (PRINIVIL,ZESTRIL) 40 MG tablet Take 1 tablet (40 mg total) by mouth daily. 30 tablet 5  . metFORMIN (GLUCOPHAGE-XR) 500 MG 24 hr tablet TAKE 1 TABLET EVERY MORNING WITH BREAKFAST (Patient taking differently: TAKE 1 TABLET EVERY MORNING WITH BREAKFAST AS NEEDED FOR BLOOD SUGARS OVER 300) 30 tablet 2  . methocarbamol (ROBAXIN) 500 MG tablet Take 1 tablet (500 mg total) by mouth 3 (three) times daily. 21 tablet 0  . metoCLOPramide (REGLAN) 10 MG tablet Take 1 tablet (10 mg total) by mouth 3 (three) times daily before meals. 90 tablet 0  . Multiple Vitamins-Minerals (CENTRUM SILVER ADULT 50+ PO) Take 1 tablet by mouth daily.    . nitroGLYCERIN (NITROSTAT) 0.4 MG SL tablet Place 1 tablet (0.4 mg total) under the tongue every 5 (five) minutes as needed. 30 tablet 1  . pantoprazole (PROTONIX) 40 MG tablet Take 1 tablet (40 mg total) by mouth daily. 30 tablet 3  . potassium chloride SA (K-DUR,KLOR-CON) 20 MEQ tablet Take as needed when need to take furosemide 3 tablet 0  . rosuvastatin (CRESTOR) 10 MG tablet Take 1  tablet (10 mg total) by mouth daily. 30 tablet 6   No current facility-administered medications for this visit.     Discontinued Meds:   There are no discontinued medications.  Patient Active Problem List   Diagnosis Date Noted  . Acute left eye pain   . Diabetes mellitus due to underlying condition with hyperglycemia ([place])   . Hyperglycemia 05/08/2015  . Type 2 diabetes mellitus with hyperosmolar nonketotic hyperglycemia ([place]) 05/08/2015  . AKI (acute kidney injury) ([place]) 05/08/2015  . Hyponatremia 05/08/2015  . Hypochloremia 05/08/2015  . Type 2 diabetes mellitus with diabetic polyneuropathy ([place]) 12/16/2013  . CAD (coronary artery disease) 09/18/2013  . Heme positive stool 06/12/2013  . Change in stool 06/12/2013  . Seasonal allergic rhinitis 07/11/2012  . Polysubstance abuse 12/24/2010  .  Bronchitis 12/24/2010  . Onychomycosis   . Acid reflux   . Nonischemic cardiomyopathy ([place])   . Type 2 diabetes mellitus with complications ([place])   . Hyperlipemia 11/16/2009  . Obesity 11/16/2009  . NONDEPENDENT TOBACCO USE DISORDER 11/16/2009  . Essential hypertension 11/16/2009  . CHEST PAIN UNSPECIFIED 11/16/2009    LABS    Component Value Date/Time   NA 138 10/23/2015 1109   NA 140 10/17/2015 1014   NA 130 (L) 10/08/2015 1421   NA 135 05/09/2015 0311   NA 136 05/08/2015 2342   NA 135 10/23/2014 1221   K 4.4 10/23/2015 1109   K 3.4 (L) 10/17/2015 1014   K 5.2 10/08/2015 1421   CL 99 10/23/2015 1109   CL 104 10/17/2015 1014   CL 91 (L) 10/08/2015 1421   CO2 25 10/23/2015 1109   CO2 26 10/17/2015 1014   CO2 23 10/08/2015 1421   GLUCOSE 315 (H) 10/23/2015 1109   GLUCOSE 88 10/17/2015 1014   GLUCOSE 593 (>) 10/08/2015 1421   GLUCOSE 171 (H) 05/09/2015 0311   GLUCOSE 156 (H) 05/08/2015 2342   BUN 23 10/23/2015 1109   BUN 18 10/17/2015 1014   BUN 21 10/08/2015 1421   BUN 23 (H) 05/09/2015 0311   BUN 25 (H) 05/08/2015 2342   BUN 20 10/23/2014 1221   CREATININE 1.14 10/23/2015 1109   CREATININE 1.01 10/17/2015 1014   CREATININE 1.01 10/08/2015 1421   CREATININE 1.04 07/11/2012 1308   CALCIUM 8.5 (L) 10/23/2015 1109   CALCIUM 9.0 10/17/2015 1014   CALCIUM 8.8 10/08/2015 1421   GFRNONAA 70 10/23/2015 1109   GFRNONAA >60 10/17/2015 1014   GFRNONAA 81 10/08/2015 1421   GFRNONAA 80 07/11/2012 1308   GFRAA 81 10/23/2015 1109   GFRAA >60 10/17/2015 1014   GFRAA 94 10/08/2015 1421   GFRAA >89 07/11/2012 1308   CMP     Component Value Date/Time   NA 138 10/23/2015 1109   K 4.4 10/23/2015 1109   CL 99 10/23/2015 1109   CO2 25 10/23/2015 1109   GLUCOSE 315 (H) 10/23/2015 1109   GLUCOSE 88 10/17/2015 1014   BUN 23 10/23/2015 1109   CREATININE 1.14 10/23/2015 1109   CREATININE 1.04 07/11/2012 1308   CALCIUM 8.5 (L) 10/23/2015 1109   PROT 5.3 (L) 10/23/2015 1109    ALBUMIN 2.9 (L) 10/23/2015 1109   AST 15 10/23/2015 1109   ALT 14 10/23/2015 1109   ALKPHOS 121 (H) 10/23/2015 1109   BILITOT 0.2 10/23/2015 1109   GFRNONAA 70 10/23/2015 1109   GFRNONAA 80 07/11/2012 1308   GFRAA 81 10/23/2015 1109   GFRAA >89 07/11/2012 1308       Component Value  Date/Time   WBC 7.6 10/17/2015 1014   WBC 6.9 05/08/2015 1652   WBC 5.7 04/17/2015 1104   HGB 12.1 (L) 10/17/2015 1014   HGB 14.2 05/08/2015 1652   HGB 13.3 04/17/2015 1104   HCT 36.3 (L) 10/17/2015 1014   HCT 42.2 05/08/2015 1652   HCT 39.6 04/17/2015 1104   MCV 83.6 10/17/2015 1014   MCV 79.9 05/08/2015 1652   MCV 80.5 04/17/2015 1104   MCV 91.0 10/11/2012 1321    Lipid Panel     Component Value Date/Time   CHOL 155 10/23/2015 1109   CHOL 277 (H) 07/11/2012 1308   TRIG 140 10/23/2015 1109   TRIG 658 (HH) 12/16/2013 1459   TRIG 522 (H) 07/11/2012 1308   HDL 44 10/23/2015 1109   HDL 42 12/16/2013 1459   HDL 39 (L) 07/11/2012 1308   CHOLHDL 3.5 10/23/2015 1109   CHOLHDL 4.6 04/06/2010 0610   VLDL 73 (H) 04/06/2010 0610   LDLCALC 83 10/23/2015 1109   LDLCALC 144 (H) 05/10/2013 1225   LDLCALC 134 (H) 07/11/2012 1308    ABG No results found for: PHART, PCO2ART, PO2ART, HCO3, TCO2, ACIDBASEDEF, O2SAT   Lab Results  Component Value Date   TSH 0.607 10/11/2012   BNP (last 3 results)  Recent Labs  04/17/15 1251 10/17/15 1014  BNP 130.0* 581.0*    ProBNP (last 3 results) No results for input(s): PROBNP in the last 8760 hours.  Cardiac Panel (last 3 results) No results for input(s): CKTOTAL, CKMB, TROPONINI, RELINDX in the last 72 hours.  Iron/TIBC/Ferritin/ %Sat No results found for: IRON, TIBC, FERRITIN, IRONPCTSAT   EKG Orders placed or performed during the hospital encounter of 10/17/15  . EKG 12-Lead  . EKG 12-Lead  . EKG     Prior Assessment and Plan Problem List as of 10/30/2015 Reviewed: 10/23/2015 12:48 PM by [name], MD     Cardiovascular and Mediastinum    Nonischemic cardiomyopathy [facility])   Last Assessment & Plan 12/24/2010 Office Visit Written 12/24/2010  1:07 PM by Ezra Sites, MD    Continue medical therapy for now. The patient will need to assure compliance. We will follow him closely and recheck his ejection fraction. At present time is not a candidate for ICD because of his history of substance abuse as well as noncompliance with medical therapy and suboptimal treatment. I also do not think invasive ischemia evaluation is no further necessary at this point in time. The patient is asymptomatic.      CAD (coronary artery disease)   Essential hypertension     Respiratory   Bronchitis   Last Assessment & Plan 12/24/2010 Office Visit Written 12/24/2010  1:01 PM by Ezra Sites, MD    I have given the patient a prescription of doxycycline 100 mg by mouth twice a day      Seasonal allergic rhinitis     Digestive   Acid reflux     Endocrine   Type 2 diabetes mellitus with complications ([name])   Type 2 diabetes mellitus with diabetic polyneuropathy ([place])   Type 2 diabetes mellitus with hyperosmolar nonketotic hyperglycemia ([place])   Diabetes mellitus due to underlying condition with hyperglycemia ([place])     Musculoskeletal and Integument   Onychomycosis     Genitourinary   AKI (acute kidney injury) ([place])     Other   Hyperlipemia   Obesity   NONDEPENDENT TOBACCO USE DISORDER   Last Assessment & Plan 12/24/2010 Office Visit Written  12/24/2010  1:01 PM by Ezra Sites, MD    I counseled the patient regarding his tobacco use.      CHEST PAIN UNSPECIFIED   Last Assessment & Plan 12/24/2010 Office Visit Written 12/24/2010 12:53 PM by Ezra Sites, MD    Patient had atypical chest pain. He reports no recurrent symptoms. Continue current medical therapy. No clear indication for invasive evaluation.      Polysubstance abuse   Heme positive stool   Change in stool   Hyperglycemia   Hyponatremia   Hypochloremia   Acute left eye pain        Imaging: Dg Chest 2 View  Result Date: 10/17/2015 CLINICAL DATA:  Swelling in lower extremities for the past week. Shortness of breath. EXAM: CHEST  2 VIEW COMPARISON:  04/17/2015 FINDINGS: Heart is borderline in size. Small right pleural effusion. Minimal right base atelectasis. Left lung is clear. No edema. No acute bony abnormality. IMPRESSION: Borderline heart size. Small right pleural effusion with right base atelectasis. Electronically Signed   By: Rolm Baptise M.D.   On: 10/17/2015 10:32

## 2015-11-04 ENCOUNTER — Emergency Department (HOSPITAL_COMMUNITY)
Admission: EM | Admit: 2015-11-04 | Discharge: 2015-11-04 | Disposition: A | Discharge status: Home / Self Care | Payer: Medicare Other | Attending: Emergency Medicine | Admitting: Emergency Medicine

## 2015-11-04 ENCOUNTER — Encounter (HOSPITAL_COMMUNITY): Payer: Self-pay | Admitting: Emergency Medicine

## 2015-11-04 ENCOUNTER — Emergency Department (HOSPITAL_COMMUNITY): Payer: Medicare Other

## 2015-11-04 DIAGNOSIS — I110 Hypertensive heart disease with heart failure: Secondary | ICD-10-CM | POA: Diagnosis not present

## 2015-11-04 DIAGNOSIS — Z7984 Long term (current) use of oral hypoglycemic drugs: Secondary | ICD-10-CM | POA: Diagnosis not present

## 2015-11-04 DIAGNOSIS — E119 Type 2 diabetes mellitus without complications: Secondary | ICD-10-CM | POA: Diagnosis not present

## 2015-11-04 DIAGNOSIS — J189 Pneumonia, unspecified organism: Secondary | ICD-10-CM | POA: Insufficient documentation

## 2015-11-04 DIAGNOSIS — J209 Acute bronchitis, unspecified: Secondary | ICD-10-CM | POA: Diagnosis not present

## 2015-11-04 DIAGNOSIS — Z794 Long term (current) use of insulin: Secondary | ICD-10-CM | POA: Diagnosis not present

## 2015-11-04 DIAGNOSIS — Z79899 Other long term (current) drug therapy: Secondary | ICD-10-CM | POA: Insufficient documentation

## 2015-11-04 DIAGNOSIS — I2510 Atherosclerotic heart disease of native coronary artery without angina pectoris: Secondary | ICD-10-CM | POA: Insufficient documentation

## 2015-11-04 DIAGNOSIS — Z7982 Long term (current) use of aspirin: Secondary | ICD-10-CM | POA: Diagnosis not present

## 2015-11-04 DIAGNOSIS — R0602 Shortness of breath: Secondary | ICD-10-CM | POA: Diagnosis not present

## 2015-11-04 DIAGNOSIS — F17290 Nicotine dependence, other tobacco product, uncomplicated: Secondary | ICD-10-CM | POA: Insufficient documentation

## 2015-11-04 DIAGNOSIS — I509 Heart failure, unspecified: Secondary | ICD-10-CM | POA: Diagnosis not present

## 2015-11-04 DIAGNOSIS — R079 Chest pain, unspecified: Secondary | ICD-10-CM | POA: Diagnosis not present

## 2015-11-04 LAB — BASIC METABOLIC PANEL
Anion gap: 7 (ref 5–15)
BUN: 20 mg/dL (ref 6–20)
CALCIUM: 9 mg/dL (ref 8.9–10.3)
CO2: 27 mmol/L (ref 22–32)
CREATININE: 1.05 mg/dL (ref 0.61–1.24)
Chloride: 105 mmol/L (ref 101–111)
Glucose, Bld: 151 mg/dL — ABNORMAL HIGH (ref 65–99)
Potassium: 4.1 mmol/L (ref 3.5–5.1)
SODIUM: 139 mmol/L (ref 135–145)

## 2015-11-04 LAB — CBC WITH DIFFERENTIAL/PLATELET
BASOS PCT: 1 %
Basophils Absolute: 0 10*3/uL (ref 0.0–0.1)
EOS ABS: 0.2 10*3/uL (ref 0.0–0.7)
EOS PCT: 3 %
HCT: 32.5 % — ABNORMAL LOW (ref 39.0–52.0)
HEMOGLOBIN: 10.7 g/dL — AB (ref 13.0–17.0)
Lymphocytes Relative: 23 %
Lymphs Abs: 1.3 10*3/uL (ref 0.7–4.0)
MCH: 27.7 pg (ref 26.0–34.0)
MCHC: 32.9 g/dL (ref 30.0–36.0)
MCV: 84.2 fL (ref 78.0–100.0)
Monocytes Absolute: 0.5 10*3/uL (ref 0.1–1.0)
Monocytes Relative: 10 %
NEUTROS PCT: 63 %
Neutro Abs: 3.6 10*3/uL (ref 1.7–7.7)
PLATELETS: 279 10*3/uL (ref 150–400)
RBC: 3.86 MIL/uL — AB (ref 4.22–5.81)
RDW: 13.4 % (ref 11.5–15.5)
WBC: 5.6 10*3/uL (ref 4.0–10.5)

## 2015-11-04 LAB — TROPONIN I

## 2015-11-04 LAB — I-STAT CG4 LACTIC ACID, ED: Lactic Acid, Venous: 1.5 mmol/L (ref 0.5–1.9)

## 2015-11-04 LAB — BRAIN NATRIURETIC PEPTIDE: B NATRIURETIC PEPTIDE 5: 307 pg/mL — AB (ref 0.0–100.0)

## 2015-11-04 MED ORDER — PREDNISONE 50 MG PO TABS
60.0000 mg | ORAL_TABLET | Freq: Once | ORAL | Status: AC
  Administered 2015-11-04: 60 mg via ORAL
  Filled 2015-11-04: qty 1

## 2015-11-04 MED ORDER — ALBUTEROL SULFATE HFA 108 (90 BASE) MCG/ACT IN AERS
2.0000 | INHALATION_SPRAY | RESPIRATORY_TRACT | Status: DC | PRN
  Administered 2015-11-04: 2 via RESPIRATORY_TRACT
  Filled 2015-11-04: qty 6.7

## 2015-11-04 MED ORDER — AEROCHAMBER Z-STAT PLUS/MEDIUM MISC: 1.0000 | Freq: Once | Status: DC

## 2015-11-04 MED ORDER — LEVOFLOXACIN IN D5W 750 MG/150ML IV SOLN
750.0000 mg | Freq: Once | INTRAVENOUS | Status: AC
  Administered 2015-11-04: 750 mg via INTRAVENOUS
  Filled 2015-11-04: qty 150

## 2015-11-04 MED ORDER — LEVOFLOXACIN 750 MG PO TABS: 750.0000 mg | ORAL_TABLET | Freq: Every day | ORAL | 0 refills | Status: DC

## 2015-11-04 MED ORDER — ALBUTEROL SULFATE (2.5 MG/3ML) 0.083% IN NEBU
5.0000 mg | INHALATION_SOLUTION | Freq: Once | RESPIRATORY_TRACT | Status: AC
  Administered 2015-11-04: 5 mg via RESPIRATORY_TRACT
  Filled 2015-11-04: qty 6

## 2015-11-04 NOTE — ED Notes (Signed)
Pt ambulated in the hall. 02 sats 94 - 97 %. RT called to instruct on use of inhaler with aerochamber.

## 2015-11-04 NOTE — ED Triage Notes (Signed)
Pt reports increasing SOB, was unable to sleep in a prone position. Pt states he is on a fluid medication.

## 2015-11-04 NOTE — ED Provider Notes (Signed)
[facility] DEPT Provider Note   CSN: [id number] Arrival date & time: 11/04/15  1038  By signing my name below, I, Judithe Modest, attest that this documentation has been prepared under the direction and in the presence of Christ Kick, MD. Electronically Signed: Judithe Modest, ER Scribe. 10/03/2015. 11:20 AM.   History   Chief Complaint Chief Complaint  Patient presents with  . Shortness of Breath   HPI  HPI Comments: Brad Mendoza is a 60 y.o. male who presents to the Emergency Department complaining of worsening SOB since last night. He is also complaining of neddle-like pain in his mid back, as well as right-sided chest and right neck pain for the last two weeks. He dips tobacco but he does not smoke, drink or use cocaine. Dr Sabra Heck is his PCP. He has a hx of cardiac disease.   Past Medical History:  Diagnosis Date  . Acid reflux   . Back pain    f4 and f5  . CHF (congestive heart failure) ([place])   . CHF (congestive heart failure) ([place])   . Diabetes mellitus 2005  . Diverticulosis   . H/O chest pain 2005   ECHO  . HTN (hypertension)   . Hyperlipemia 11/16/2009   Qualifier: Diagnosis of  By: [name]    . Hyperlipidemia   . Onychomycosis     Patient Active Problem List   Diagnosis Date Noted  . Acute left eye pain   . Diabetes mellitus due to underlying condition with hyperglycemia ([place])   . Hyperglycemia 05/08/2015  . Type 2 diabetes mellitus with hyperosmolar nonketotic hyperglycemia ([place]) 05/08/2015  . AKI (acute kidney injury) ([place]) 05/08/2015  . Hyponatremia 05/08/2015  . Hypochloremia 05/08/2015  . Type 2 diabetes mellitus with diabetic polyneuropathy ([place]) 12/16/2013  . CAD (coronary artery disease) 09/18/2013  . Heme positive stool 06/12/2013  . Change in stool 06/12/2013  . Seasonal allergic rhinitis 07/11/2012  . Polysubstance abuse 12/24/2010  . Bronchitis 12/24/2010  . Onychomycosis   . Acid reflux   . Nonischemic cardiomyopathy  ([place])   . Type 2 diabetes mellitus with complications ([place])   . Hyperlipemia 11/16/2009  . Obesity 11/16/2009  . NONDEPENDENT TOBACCO USE DISORDER 11/16/2009  . Essential hypertension 11/16/2009  . CHEST PAIN UNSPECIFIED 11/16/2009    Past Surgical History:  Procedure Laterality Date  . APPENDECTOMY    . CARDIAC CATHETERIZATION  2005   4 frech cath  . COLONOSCOPY N/A 08/01/2013   Procedure: COLONOSCOPY;  Surgeon: Rogene Houston, MD;  Location: [facility] ENDO SU[facility];  Service: Endoscopy;  Laterality: N/A;  rescheduled to [place] notified pt  . ESOPHAGOGASTRODUODENOSCOPY N/A 04/23/2015   Procedure: ESOPHAGOGASTRODUODENOSCOPY (EGD);  Surgeon: Rogene Houston, MD;  Location: [facility] ENDO SUITE;  Service: Endoscopy;  Laterality: N/A;  1:25     Home Medications    Prior to Admission medications   Medication Sig Start Date End Date Taking? Authorizing Provider  albuterol (PROVENTIL HFA;VENTOLIN HFA) 108 (90 Base) MCG/ACT inhaler Inhale 2 puffs into the lungs every 6 (six) hours as needed for wheezing or shortness of breath. 05/21/15  Yes [name], MD  amLODipine (NORVASC) 10 MG tablet Take 1 tablet (10 mg total) by mouth daily. 10/23/15  Yes [name], MD  aspirin EC 81 MG tablet Take 81 mg by mouth daily.   Yes Historical Provider, MD  Capsaicin (ICY HOT ARTHRITIS THERAPY EX) Apply 1 application topically 2 (two) times daily as needed (back pain).  Yes Historical Provider, MD  carvedilol (COREG) 12.5 MG tablet Take 1 tablet (12.5 mg total) by mouth 2 (two) times daily with a meal. 10/23/15  Yes [name], MD  fluticasone [facility]) 50 MCG/ACT nasal spray USE 2 SPRAYS IN [facility] NOSTRIL DAILY 08/12/15  Yes [name], MD  furosemide (LASIX) 40 MG tablet Take 1.5 tablets (60 mg total) by mouth daily. 10/30/15 11/29/15 Yes Lendon Colonel, NP  gabapentin (NEURONTIN) 300 MG capsule Take 1 capsule (300 mg total) by mouth 3 (three) times daily. 10/23/15  Yes [name], MD  Insulin  Detemir (LEVEMIR FLEXTOUCH) 100 UNIT/ML Pen INJECT 35 UNITS SQ TWICE DAILY WITH BREAKFAST AND SUPPER 10/23/15  Yes [name], MD  insulin lispro (HUMALOG KWIKPEN) 100 UNIT/ML KiwkPen INJECT UP TO 15 UNITS PRIOR TO EACH MEALAS NEEDED FOR ELAVATED BG 10/23/15  Yes [name], MD  isosorbide mononitrate (IMDUR) 60 MG 24 hr tablet Take 1.5 tablets (90 mg total) by mouth daily. 10/23/15  Yes [name], MD  lisinopril (PRINIVIL,ZESTRIL) 40 MG tablet Take 1 tablet (40 mg total) by mouth daily. 10/23/15  Yes [name], MD  metFORMIN (GLUCOPHAGE-XR) 500 MG 24 hr tablet TAKE 1 TABLET EVERY MORNING WITH BREAKFAST Patient taking differently: TAKE 1 TABLET EVERY MORNING WITH BREAKFAST AS NEEDED FOR BLOOD SUGARS OVER 300 12/23/14  Yes Tammy Eckard, PharmD  Multiple Vitamins-Minerals (CENTRUM SILVER ADULT 50+ PO) Take 1 tablet by mouth daily.   Yes Historical Provider, MD  nitroGLYCERIN (NITROSTAT) 0.4 MG SL tablet Place 1 tablet (0.4 mg total) under the tongue every 5 (five) minutes as needed. 10/08/15  Yes [name], MD  Omega-3 Fatty Acids (FISH OIL) 1000 MG CPDR Take 1 capsule by mouth daily.   Yes Historical Provider, MD  pantoprazole (PROTONIX) 40 MG tablet Take 1 tablet (40 mg total) by mouth daily. 10/23/15  Yes [name], MD  potassium chloride SA (K-DUR,KLOR-CON) 20 MEQ tablet Take as needed when need to take furosemide 10/30/15  Yes Lendon Colonel, NP  rosuvastatin (CRESTOR) 10 MG tablet Take 1 tablet (10 mg total) by mouth daily. 10/23/15  Yes [name], MD  levofloxacin (LEVAQUIN) 750 MG tablet Take 1 tablet (750 mg total) by mouth daily. 11/04/15   Daleen Bo, MD  metoCLOPramide (REGLAN) 10 MG tablet Take 1 tablet (10 mg total) by mouth 3 (three) times daily before meals. Patient not taking: Reported on 11/04/2015 04/23/15   Rogene Houston, MD    Family History Family History  Problem Relation Age of Onset  . Hypertension Father   . Diabetes Father   . Cancer  Mother   . Alcohol abuse Brother   . Colon cancer Neg Hx     Social History Social History  Substance Use Topics  . Smoking status: Never Smoker  . Smokeless tobacco: Current User    Types: Snuff     Comment: dips snuff about 6 cans/week for 3 years  . Alcohol use 0.6 oz/week    1 Cans of beer per week     Comment: occ      Allergies   Sulfa antibiotics and Penicillins   Review of Systems Review of Systems  Constitutional: Negative for chills and fever.  HENT: Positive for sore throat.   Respiratory: Positive for shortness of breath.   Cardiovascular: Positive for chest pain and leg swelling.  All other systems reviewed and are negative.   Physical Exam Updated Vital Signs  BP 148/84 (BP Location: Left Arm)   Pulse 88   Temp 98.7 F (37.1 C) (Oral)   Resp 15   Ht 5\' 9"  (1.753 m)   Wt 207 lb (93.9 kg)   SpO2 93%   BMI 30.57 kg/m   Physical Exam  Constitutional: He is oriented to person, place, and time. He appears well-developed and well-nourished.  HENT:  Head: Normocephalic and atraumatic.  Right Ear: External ear normal.  Left Ear: External ear normal.  Eyes: Conjunctivae and EOM are normal. Pupils are equal, round, and reactive to light.  Neck: Normal range of motion and phonation normal. Neck supple.  Cardiovascular: Normal rate, regular rhythm and normal heart sounds.   Tachypnic, with decreass air movement right base. Scattered rhonchi, without wheezes. 2+ peripheral edema of lower legs.  Pulmonary/Chest: Effort normal and breath sounds normal. He exhibits no bony tenderness.  Abdominal: Soft. There is no tenderness.  Musculoskeletal: Normal range of motion.  Neurological: He is alert and oriented to person, place, and time. No cranial nerve deficit or sensory deficit. He exhibits normal muscle tone. Coordination normal.  Skin: Skin is warm, dry and intact.  Psychiatric: He has a normal mood and affect. His behavior is normal. Judgment and thought  content normal.  Nursing note and vitals reviewed.  ED Treatments / Results  Labs (all labs ordered are listed, but only abnormal results are displayed) Labs Reviewed  BASIC METABOLIC PANEL - Abnormal; Notable for the following:       Result Value   Glucose, Bld 151 (*)    All other components within normal limits  CBC WITH DIFFERENTIAL/PLATELET - Abnormal; Notable for the following:    RBC 3.86 (*)    Hemoglobin 10.7 (*)    HCT 32.5 (*)    All other components within normal limits  BRAIN NATRIURETIC PEPTIDE - Abnormal; Notable for the following:    B Natriuretic Peptide 307.0 (*)    All other components within normal limits  CULTURE, BLOOD (ROUTINE X 2)  CULTURE, BLOOD (ROUTINE X 2)  TROPONIN I  I-STAT CG4 LACTIC ACID, ED    EKG  EKG Interpretation  Date/Time:  Wednesday November 04 2015 10:45:59 EDT Ventricular Rate:  84 PR Interval:    QRS Duration: 96 QT Interval:  404 QTC Calculation: 478 R Axis:   16 Text Interpretation:  Sinus rhythm Nonspecific T abnormalities, lateral leads Borderline prolonged QT interval since last tracing no significant change Confirmed by Eulis Foster  MD, Shenouda Genova [id number]) on 11/04/2015 10:50:43 AM       Radiology Dg Chest 2 View  Result Date: 11/04/2015 CLINICAL DATA:  Shortness of breath with left-sided chest pain EXAM: CHEST  2 VIEW COMPARISON:  10/17/2015 FINDINGS: Streaky bibasilar opacity. Small bilateral pleural effusion stable to minimally increased from prior. Small volume fluid within the right major fissure. No air bronchogram. No pneumothorax. Stable borderline heart size.  Negative aortic and hilar contours. IMPRESSION: 1. Bibasilar atelectasis, superimposed bronchopneumonia not excluded in the appropriate clinical setting. 2. Small bilateral pleural effusion, greater on the right. Electronically Signed   By: Monte Fantasia M.D.   On: 11/04/2015 11:41    Procedures Procedures (including critical care time)  Medications Ordered in  ED Medications  albuterol (PROVENTIL HFA;VENTOLIN HFA) 108 (90 Base) MCG/ACT inhaler 2 puff (not administered)  aerochamber Z-Stat Plus/medium 1 each (not administered)  albuterol (PROVENTIL) (2.5 MG/3ML) 0.083% nebulizer solution 5 mg (5 mg Nebulization Given 11/04/15 1109)  predniSONE (DELTASONE) tablet 60 mg (60 mg  Oral Given 11/04/15 1141)  levofloxacin (LEVAQUIN) IVPB 750 mg (750 mg Intravenous New Bag/Given 11/04/15 1349)     Initial Impression / Assessment and Plan / ED Course  I have reviewed the triage vital signs and the nursing notes.  Pertinent labs & imaging results that were available during my care of the patient were reviewed by me and considered in my medical decision making (see chart for details).  Clinical Course    Medications  albuterol (PROVENTIL HFA;VENTOLIN HFA) 108 (90 Base) MCG/ACT inhaler 2 puff (not administered)  aerochamber Z-Stat Plus/medium 1 each (not administered)  albuterol (PROVENTIL) (2.5 MG/3ML) 0.083% nebulizer solution 5 mg (5 mg Nebulization Given 11/04/15 1109)  predniSONE (DELTASONE) tablet 60 mg (60 mg Oral Given 11/04/15 1141)  levofloxacin (LEVAQUIN) IVPB 750 mg (750 mg Intravenous New Bag/Given 11/04/15 1349)    Patient Vitals for the past 24 hrs:  BP Temp Temp src Pulse Resp SpO2 Height Weight  11/04/15 1521 148/84 - - 88 15 - - -  11/04/15 1407 - - - 79 - 93 % - -  11/04/15 1400 150/79 - - - - - - -  11/04/15 1330 149/89 - - 81 23 93 % - -  11/04/15 1300 - - - 84 24 94 % - -  11/04/15 1256 141/90 98.7 F (37.1 C) Oral 81 18 95 % - -  11/04/15 1230 141/90 - - 78 (!) 28 94 % - -  11/04/15 1200 137/86 - - 80 (!) 29 93 % - -  11/04/15 1130 126/83 - - 81 18 93 % - -  11/04/15 1110 - - - - - 92 % - -  11/04/15 1100 136/86 - - 79 (!) 27 93 % - -  11/04/15 1048 - - - - - - 5\' 9"  (1.753 m) 207 lb (93.9 kg)  11/04/15 1046 133/76 98.5 F (36.9 C) Oral 93 (!) 32 90 % - -    3:45 PM Reevaluation with update and discussion. After initial  assessment and treatment, an updated evaluation reveals Patient states he feels much better. Currently, no tachypnea. Respiratory rate 20. Oxygen saturation 91% on room air. Patient states he wants to go home and feels comfortable. Findings discussed with patient and all questions were answered. Krissia Schreier L    Final Clinical Impressions(s) / ED Diagnoses   Final diagnoses:  Bronchitis, acute, with bronchospasm  CAP (community acquired pneumonia)    Evaluation is consistent with bronchitis with possible community-acquired pneumonia. Patient improved with treatment stable for discharge. Doubt sepsis, PE, metabolic instability.  Nursing Notes Reviewed/ Care Coordinated Applicable Imaging Reviewed Interpretation of Laboratory Data incorporated into ED treatment  The patient appears reasonably screened and/or stabilized for discharge and I doubt any other medical condition or other [facility] requiring further screening, evaluation, or treatment in the ED at this time prior to discharge.  Plan: Home Medications- Lavaquin, Prednisone; Home Treatments- rest, fluids; return here if the recommended treatment, does not improve the symptoms; Recommended follow up- PCP 1 week   New Prescriptions New Prescriptions   LEVOFLOXACIN (LEVAQUIN) 750 MG TABLET    Take 1 tablet (750 mg total) by mouth daily.      I personally performed the services described in this documentation, which was scribed in my presence. The recorded information has been reviewed and is accurate.        Daleen Bo, MD 11/04/15 1606

## 2015-11-04 NOTE — Discharge Instructions (Signed)
Use the albuterol inhaler, with the spacer, 2 puffs every 3 or 4 hours for trouble breathing or cough  Start the antibiotic prescription, tomorrow.  Use Tylenol, for pain or fever.  Get plenty of rest and drink a lot of fluids.

## 2015-11-04 NOTE — ED Notes (Signed)
Pt having sob since last night with cp across top of chest.

## 2015-11-04 NOTE — ED Notes (Signed)
Lung sounds clear 

## 2015-11-09 LAB — CULTURE, BLOOD (ROUTINE X 2)
CULTURE: NO GROWTH
Culture: NO GROWTH

## 2015-11-12 ENCOUNTER — Telehealth: Payer: Self-pay | Admitting: Family Medicine

## 2015-11-12 ENCOUNTER — Other Ambulatory Visit: Payer: Self-pay | Admitting: *Deleted

## 2015-11-12 DIAGNOSIS — N289 Disorder of kidney and ureter, unspecified: Secondary | ICD-10-CM

## 2015-11-12 NOTE — Telephone Encounter (Signed)
Referral for nephrology placed.

## 2015-11-16 ENCOUNTER — Encounter (HOSPITAL_COMMUNITY): Payer: Self-pay | Admitting: Emergency Medicine

## 2015-11-16 ENCOUNTER — Emergency Department (HOSPITAL_COMMUNITY): Payer: Medicare Other

## 2015-11-16 ENCOUNTER — Inpatient Hospital Stay (HOSPITAL_COMMUNITY)
Admission: EM | Admit: 2015-11-16 | Discharge: 2015-11-20 | DRG: 293 | Disposition: A | Discharge status: Home / Self Care | Payer: Medicare Other | Attending: Internal Medicine | Admitting: Internal Medicine

## 2015-11-16 DIAGNOSIS — R7989 Other specified abnormal findings of blood chemistry: Secondary | ICD-10-CM | POA: Diagnosis not present

## 2015-11-16 DIAGNOSIS — Z23 Encounter for immunization: Secondary | ICD-10-CM | POA: Diagnosis not present

## 2015-11-16 DIAGNOSIS — E1165 Type 2 diabetes mellitus with hyperglycemia: Secondary | ICD-10-CM | POA: Diagnosis not present

## 2015-11-16 DIAGNOSIS — Z7982 Long term (current) use of aspirin: Secondary | ICD-10-CM

## 2015-11-16 DIAGNOSIS — I5042 Chronic combined systolic (congestive) and diastolic (congestive) heart failure: Secondary | ICD-10-CM | POA: Insufficient documentation

## 2015-11-16 DIAGNOSIS — I272 Other secondary pulmonary hypertension: Secondary | ICD-10-CM | POA: Diagnosis not present

## 2015-11-16 DIAGNOSIS — I5043 Acute on chronic combined systolic (congestive) and diastolic (congestive) heart failure: Secondary | ICD-10-CM | POA: Diagnosis present

## 2015-11-16 DIAGNOSIS — I2510 Atherosclerotic heart disease of native coronary artery without angina pectoris: Secondary | ICD-10-CM | POA: Diagnosis present

## 2015-11-16 DIAGNOSIS — R079 Chest pain, unspecified: Secondary | ICD-10-CM | POA: Diagnosis not present

## 2015-11-16 DIAGNOSIS — Z79899 Other long term (current) drug therapy: Secondary | ICD-10-CM

## 2015-11-16 DIAGNOSIS — I5023 Acute on chronic systolic (congestive) heart failure: Secondary | ICD-10-CM | POA: Diagnosis not present

## 2015-11-16 DIAGNOSIS — I110 Hypertensive heart disease with heart failure: Principal | ICD-10-CM | POA: Diagnosis present

## 2015-11-16 DIAGNOSIS — I509 Heart failure, unspecified: Secondary | ICD-10-CM

## 2015-11-16 DIAGNOSIS — Z833 Family history of diabetes mellitus: Secondary | ICD-10-CM

## 2015-11-16 DIAGNOSIS — I428 Other cardiomyopathies: Secondary | ICD-10-CM | POA: Diagnosis not present

## 2015-11-16 DIAGNOSIS — E118 Type 2 diabetes mellitus with unspecified complications: Secondary | ICD-10-CM

## 2015-11-16 DIAGNOSIS — E785 Hyperlipidemia, unspecified: Secondary | ICD-10-CM | POA: Diagnosis not present

## 2015-11-16 DIAGNOSIS — I10 Essential (primary) hypertension: Secondary | ICD-10-CM | POA: Diagnosis present

## 2015-11-16 DIAGNOSIS — Z7951 Long term (current) use of inhaled steroids: Secondary | ICD-10-CM

## 2015-11-16 DIAGNOSIS — Z794 Long term (current) use of insulin: Secondary | ICD-10-CM

## 2015-11-16 DIAGNOSIS — E11319 Type 2 diabetes mellitus with unspecified diabetic retinopathy without macular edema: Secondary | ICD-10-CM | POA: Diagnosis present

## 2015-11-16 DIAGNOSIS — Z880 Allergy status to penicillin: Secondary | ICD-10-CM

## 2015-11-16 DIAGNOSIS — F17200 Nicotine dependence, unspecified, uncomplicated: Secondary | ICD-10-CM | POA: Diagnosis present

## 2015-11-16 DIAGNOSIS — R0602 Shortness of breath: Secondary | ICD-10-CM | POA: Diagnosis not present

## 2015-11-16 DIAGNOSIS — K219 Gastro-esophageal reflux disease without esophagitis: Secondary | ICD-10-CM | POA: Diagnosis present

## 2015-11-16 DIAGNOSIS — Z882 Allergy status to sulfonamides status: Secondary | ICD-10-CM

## 2015-11-16 LAB — BASIC METABOLIC PANEL
Anion gap: 7 (ref 5–15)
BUN: 24 mg/dL — ABNORMAL HIGH (ref 6–20)
CHLORIDE: 106 mmol/L (ref 101–111)
CO2: 24 mmol/L (ref 22–32)
CREATININE: 1.17 mg/dL (ref 0.61–1.24)
Calcium: 8.8 mg/dL — ABNORMAL LOW (ref 8.9–10.3)
GFR calc non Af Amer: >60 mL/min (ref >60)
Glucose, Bld: 204 mg/dL — ABNORMAL HIGH (ref 65–99)
Potassium: 4.4 mmol/L (ref 3.5–5.1)
SODIUM: 137 mmol/L (ref 135–145)

## 2015-11-16 LAB — LIPID PANEL
Cholesterol: 130 mg/dL (ref 0–200)
HDL: 45 mg/dL (ref >40)
LDL CALC: 67 mg/dL (ref 0–99)
Total CHOL/HDL Ratio: 2.9 RATIO
Triglycerides: 91 mg/dL (ref <150)
VLDL: 18 mg/dL (ref 0–40)

## 2015-11-16 LAB — GLUCOSE, CAPILLARY: GLUCOSE-CAPILLARY: 209 mg/dL — AB (ref 65–99)

## 2015-11-16 LAB — CBC
HCT: 32.5 % — ABNORMAL LOW (ref 39.0–52.0)
Hemoglobin: 10.5 g/dL — ABNORMAL LOW (ref 13.0–17.0)
MCH: 27.1 pg (ref 26.0–34.0)
MCHC: 32.3 g/dL (ref 30.0–36.0)
MCV: 84 fL (ref 78.0–100.0)
PLATELETS: 221 10*3/uL (ref 150–400)
RBC: 3.87 MIL/uL — AB (ref 4.22–5.81)
RDW: 13.2 % (ref 11.5–15.5)
WBC: 7.1 10*3/uL (ref 4.0–10.5)

## 2015-11-16 LAB — BRAIN NATRIURETIC PEPTIDE: B NATRIURETIC PEPTIDE 5: 229 pg/mL — AB (ref 0.0–100.0)

## 2015-11-16 LAB — TROPONIN I

## 2015-11-16 LAB — TSH: TSH: 1.892 u[IU]/mL (ref 0.350–4.500)

## 2015-11-16 MED ORDER — PNEUMOCOCCAL VAC POLYVALENT 25 MCG/0.5ML IJ INJ
0.5000 mL | INJECTION | INTRAMUSCULAR | Status: AC
  Administered 2015-11-17: 0.5 mL via INTRAMUSCULAR
  Filled 2015-11-16: qty 0.5

## 2015-11-16 MED ORDER — ASPIRIN EC 81 MG PO TBEC
81.0000 mg | DELAYED_RELEASE_TABLET | Freq: Every day | ORAL | Status: DC
  Administered 2015-11-16 – 2015-11-20 (×5): 81 mg via ORAL
  Filled 2015-11-16 (×5): qty 1

## 2015-11-16 MED ORDER — ONDANSETRON HCL 4 MG/2ML IJ SOLN: 4.0000 mg | Freq: Four times a day (QID) | INTRAMUSCULAR | Status: DC | PRN

## 2015-11-16 MED ORDER — INSULIN DETEMIR 100 UNIT/ML ~~LOC~~ SOLN
35.0000 [IU] | Freq: Two times a day (BID) | SUBCUTANEOUS | Status: DC
  Administered 2015-11-17 – 2015-11-18 (×4): 35 [IU] via SUBCUTANEOUS
  Filled 2015-11-16 (×7): qty 0.35

## 2015-11-16 MED ORDER — FUROSEMIDE 10 MG/ML IJ SOLN
60.0000 mg | Freq: Once | INTRAMUSCULAR | Status: AC
  Administered 2015-11-16: 60 mg via INTRAVENOUS
  Filled 2015-11-16: qty 6

## 2015-11-16 MED ORDER — INSULIN ASPART 100 UNIT/ML ~~LOC~~ SOLN
0.0000 [IU] | Freq: Three times a day (TID) | SUBCUTANEOUS | Status: DC
  Administered 2015-11-17: 3 [IU] via SUBCUTANEOUS
  Administered 2015-11-17 – 2015-11-18 (×2): 2 [IU] via SUBCUTANEOUS
  Administered 2015-11-18: 3 [IU] via SUBCUTANEOUS
  Administered 2015-11-19 – 2015-11-20 (×2): 5 [IU] via SUBCUTANEOUS

## 2015-11-16 MED ORDER — LISINOPRIL 10 MG PO TABS
40.0000 mg | ORAL_TABLET | Freq: Every day | ORAL | Status: DC
  Administered 2015-11-16 – 2015-11-20 (×5): 40 mg via ORAL
  Filled 2015-11-16 (×5): qty 4

## 2015-11-16 MED ORDER — FUROSEMIDE 40 MG PO TABS
60.0000 mg | ORAL_TABLET | Freq: Every day | ORAL | Status: DC
  Filled 2015-11-16: qty 1

## 2015-11-16 MED ORDER — ROSUVASTATIN CALCIUM 10 MG PO TABS
10.0000 mg | ORAL_TABLET | Freq: Every day | ORAL | Status: DC
  Administered 2015-11-16 – 2015-11-19 (×4): 10 mg via ORAL
  Filled 2015-11-16 (×5): qty 1

## 2015-11-16 MED ORDER — ENOXAPARIN SODIUM 40 MG/0.4ML ~~LOC~~ SOLN
40.0000 mg | SUBCUTANEOUS | Status: DC
  Administered 2015-11-16 – 2015-11-19 (×4): 40 mg via SUBCUTANEOUS
  Filled 2015-11-16 (×4): qty 0.4

## 2015-11-16 MED ORDER — ASPIRIN 81 MG PO CHEW
324.0000 mg | CHEWABLE_TABLET | Freq: Once | ORAL | Status: AC
  Administered 2015-11-16: 324 mg via ORAL
  Filled 2015-11-16: qty 4

## 2015-11-16 MED ORDER — GI COCKTAIL ~~LOC~~: 30.0000 mL | Freq: Four times a day (QID) | ORAL | Status: DC | PRN

## 2015-11-16 MED ORDER — ISOSORBIDE MONONITRATE ER 60 MG PO TB24
90.0000 mg | ORAL_TABLET | Freq: Every day | ORAL | Status: DC
  Administered 2015-11-17 – 2015-11-20 (×4): 90 mg via ORAL
  Filled 2015-11-16 (×4): qty 2

## 2015-11-16 MED ORDER — MORPHINE SULFATE (PF) 2 MG/ML IV SOLN
2.0000 mg | INTRAVENOUS | Status: DC | PRN
  Administered 2015-11-16 – 2015-11-20 (×5): 2 mg via INTRAVENOUS
  Filled 2015-11-16 (×5): qty 1

## 2015-11-16 MED ORDER — PANTOPRAZOLE SODIUM 40 MG PO TBEC
40.0000 mg | DELAYED_RELEASE_TABLET | Freq: Every day | ORAL | Status: DC
  Administered 2015-11-16 – 2015-11-20 (×5): 40 mg via ORAL
  Filled 2015-11-16 (×5): qty 1

## 2015-11-16 MED ORDER — ACETAMINOPHEN 325 MG PO TABS
650.0000 mg | ORAL_TABLET | ORAL | Status: DC | PRN
  Administered 2015-11-17 – 2015-11-18 (×2): 650 mg via ORAL
  Filled 2015-11-16 (×2): qty 2

## 2015-11-16 MED ORDER — NICOTINE 14 MG/24HR TD PT24
14.0000 mg | MEDICATED_PATCH | Freq: Every day | TRANSDERMAL | Status: DC
  Administered 2015-11-17 – 2015-11-20 (×4): 14 mg via TRANSDERMAL
  Filled 2015-11-16 (×5): qty 1

## 2015-11-16 MED ORDER — CARVEDILOL 12.5 MG PO TABS
12.5000 mg | ORAL_TABLET | Freq: Two times a day (BID) | ORAL | Status: DC
  Administered 2015-11-16 – 2015-11-20 (×8): 12.5 mg via ORAL
  Filled 2015-11-16 (×8): qty 1

## 2015-11-16 MED ORDER — POTASSIUM CHLORIDE CRYS ER 20 MEQ PO TBCR
20.0000 meq | EXTENDED_RELEASE_TABLET | Freq: Every day | ORAL | Status: DC
  Administered 2015-11-17 – 2015-11-18 (×2): 20 meq via ORAL
  Filled 2015-11-16 (×3): qty 1

## 2015-11-16 MED ORDER — GABAPENTIN 300 MG PO CAPS
300.0000 mg | ORAL_CAPSULE | Freq: Three times a day (TID) | ORAL | Status: DC
  Administered 2015-11-16 – 2015-11-20 (×4): 300 mg via ORAL
  Filled 2015-11-16 (×10): qty 1

## 2015-11-16 MED ORDER — NITROGLYCERIN 0.4 MG SL SUBL: 0.4000 mg | SUBLINGUAL_TABLET | SUBLINGUAL | Status: DC | PRN

## 2015-11-16 MED ORDER — INFLUENZA VAC SPLIT QUAD 0.5 ML IM SUSY
0.5000 mL | PREFILLED_SYRINGE | INTRAMUSCULAR | Status: AC
  Administered 2015-11-17: 0.5 mL via INTRAMUSCULAR
  Filled 2015-11-16: qty 0.5

## 2015-11-16 MED ORDER — FLUTICASONE PROPIONATE 50 MCG/ACT NA SUSP
2.0000 | Freq: Every day | NASAL | Status: DC
  Administered 2015-11-16 – 2015-11-20 (×5): 2 via NASAL
  Filled 2015-11-16 (×2): qty 16

## 2015-11-16 MED ORDER — ALBUTEROL SULFATE (2.5 MG/3ML) 0.083% IN NEBU: 3.0000 mL | INHALATION_SOLUTION | Freq: Four times a day (QID) | RESPIRATORY_TRACT | Status: DC | PRN

## 2015-11-16 NOTE — ED Notes (Signed)
Gave EKG to Dr Rogene Houston.

## 2015-11-16 NOTE — H&P (Signed)
History and Physical    Brad Mendoza FUX:[id number] DOB: Dec 20, 1955 DOA: 11/16/2015  PCP: [name], MD Consultants:  Bronson Ing - cardiology Patient coming from: home - lives alone; NOK: wife (estranged) - [phone], Ephriam Mendoza  Chief Complaint: chest pain  HPI: Brad Mendoza is a 60 y.o. male with medical history significant of DM, HTN, HLD, and CHF presenting with chest pain/SOB.  Last night after eating it was hard for him to breathe, couldn't lay down.  Ate cheese taco stuff from the store and some potato chips and then a ground [place] burger with egg, peppers, and onions.  Also felt tired.  +LE edema.  Today, felt like something was on his chest, needle-like sensations in chest and abdomen.  He also had pain and took NTG with some improvement Saturday night.  Also feels like something is under B shoulder blades and running down his side to his left kidney.  Also with neck pain.  +headaches.  Cath in 2005, no recent stress test but last was nuclear "a while back."   ED Course: Given ASA and Lasix and hospitalists called for admission for chest pain  Review of Systems: As per HPI; otherwise 10 point review of systems reviewed and negative.   Ambulatory Status:  Ambulates independently  Past Medical History:  Diagnosis Date  . Acid reflux   . Back pain    f4 and f5  . CHF (congestive heart failure) ([place])    last echo was in March 2016  . Diabetes mellitus 2005  . Diverticulosis   . H/O chest pain 2005   ECHO  . HTN (hypertension)   . Hyperlipidemia   . Onychomycosis     Past Surgical History:  Procedure Laterality Date  . APPENDECTOMY    . CARDIAC CATHETERIZATION  2005   4 frech cath  . COLONOSCOPY N/A 08/01/2013   Procedure: COLONOSCOPY;  Surgeon: Rogene Houston, MD;  Location: [facility];  Service: Endoscopy;  Laterality: N/A;  rescheduled to [place] notified pt  . ESOPHAGOGASTRODUODENOSCOPY N/A 04/23/2015   Procedure: ESOPHAGOGASTRODUODENOSCOPY  (EGD);  Surgeon: Rogene Houston, MD;  Location: [facility];  Service: Endoscopy;  Laterality: N/A;  1:25    Social History   Social History  . Marital status: Married    Spouse name: N/A  . Number of children: N/A  . Years of education: N/A   Occupational History  . unemployed    Social History Main Topics  . Smoking status: Never Smoker  . Smokeless tobacco: Current User    Types: Snuff     Comment: dips snuff about 6 cans/week for 3 years  . Alcohol use 0.6 oz/week    1 Cans of beer per week     Comment: occ   . Drug use:     Types: Marijuana     Comment: last use 3-4 days ago  . Sexual activity: Yes   Other Topics Concern  . Not on file   Social History Narrative  . No narrative on file    Allergies  Allergen Reactions  . Sulfa Antibiotics Shortness Of Breath  . Penicillins Rash    Has patient had a PCN reaction causing immediate rash, facial/tongue/throat swelling, SOB or lightheadedness with hypotension: No Has patient had a PCN reaction causing severe rash involving mucus membranes or skin necrosis: No Has patient had a PCN reaction that required hospitalization No Has patient had a PCN reaction occurring within the last 10 years: No  If all of the above answers are "NO", then may proceed with Cephalosporin use.     Family History  Problem Relation Age of Onset  . Hypertension Father   . Diabetes Father   . Cancer Mother   . Alcohol abuse Brother   . Colon cancer Neg Hx     Prior to Admission medications   Medication Sig Start Date End Date Taking? Authorizing Provider  albuterol (PROVENTIL HFA;VENTOLIN HFA) 108 (90 Base) MCG/ACT inhaler Inhale 2 puffs into the lungs every 6 (six) hours as needed for wheezing or shortness of breath. 05/21/15   [name], MD  amLODipine (NORVASC) 10 MG tablet Take 1 tablet (10 mg total) by mouth daily. 10/23/15   [name], MD  aspirin EC 81 MG tablet Take 81 mg by mouth daily.    Historical Provider, MD    Capsaicin (ICY HOT ARTHRITIS THERAPY EX) Apply 1 application topically 2 (two) times daily as needed (back pain).     Historical Provider, MD  carvedilol (COREG) 12.5 MG tablet Take 1 tablet (12.5 mg total) by mouth 2 (two) times daily with a meal. 10/23/15   [name], MD  fluticasone [facility]) 50 MCG/ACT nasal spray USE 2 SPRAYS IN [facility] NOSTRIL DAILY 08/12/15   [name], MD  furosemide (LASIX) 40 MG tablet Take 1.5 tablets (60 mg total) by mouth daily. 10/30/15 11/29/15  Lendon Colonel, NP  gabapentin (NEURONTIN) 300 MG capsule Take 1 capsule (300 mg total) by mouth 3 (three) times daily. 10/23/15   [name], MD  Insulin Detemir (LEVEMIR FLEXTOUCH) 100 UNIT/ML Pen INJECT 35 UNITS SQ TWICE DAILY WITH BREAKFAST AND SUPPER 10/23/15   [name], MD  insulin lispro (HUMALOG KWIKPEN) 100 UNIT/ML KiwkPen INJECT UP TO 15 UNITS PRIOR TO EACH MEALAS NEEDED FOR ELAVATED BG 10/23/15   [name], MD  isosorbide mononitrate (IMDUR) 60 MG 24 hr tablet Take 1.5 tablets (90 mg total) by mouth daily. 10/23/15   [name], MD  levofloxacin (LEVAQUIN) 750 MG tablet Take 1 tablet (750 mg total) by mouth daily. 11/04/15   Daleen Bo, MD  lisinopril (PRINIVIL,ZESTRIL) 40 MG tablet Take 1 tablet (40 mg total) by mouth daily. 10/23/15   [name], MD  metFORMIN (GLUCOPHAGE-XR) 500 MG 24 hr tablet TAKE 1 TABLET EVERY MORNING WITH BREAKFAST Patient taking differently: TAKE 1 TABLET EVERY MORNING WITH BREAKFAST AS NEEDED FOR BLOOD SUGARS OVER 300 12/23/14   Cherre Robins, PharmD  metoCLOPramide (REGLAN) 10 MG tablet Take 1 tablet (10 mg total) by mouth 3 (three) times daily before meals. Patient not taking: Reported on 11/04/2015 04/23/15   Rogene Houston, MD  Multiple Vitamins-Minerals (CENTRUM SILVER ADULT 50+ PO) Take 1 tablet by mouth daily.    Historical Provider, MD  nitroGLYCERIN (NITROSTAT) 0.4 MG SL tablet Place 1 tablet (0.4 mg total) under the tongue every 5 (five) minutes as  needed. 10/08/15   [name], MD  Omega-3 Fatty Acids (FISH OIL) 1000 MG CPDR Take 1 capsule by mouth daily.    Historical Provider, MD  pantoprazole (PROTONIX) 40 MG tablet Take 1 tablet (40 mg total) by mouth daily. 10/23/15   [name], MD  potassium chloride SA (K-DUR,KLOR-CON) 20 MEQ tablet Take as needed when need to take furosemide 10/30/15   Lendon Colonel, NP  rosuvastatin (CRESTOR) 10 MG tablet Take 1 tablet (10 mg total) by mouth daily. 10/23/15   [name],  MD    Physical Exam: Vitals:   11/16/15 1709 11/16/15 1730 11/16/15 1800 11/16/15 2111  BP: 149/99 148/96 164/93 139/90  Pulse: 88 96 81 87  Resp: 18 (!) 30 24 (!) 22  Temp:    98.2 F (36.8 C)  TempSrc:    Oral  SpO2: 90% 95% 93% 100%  Weight:    95.5 kg (210 lb 8.6 oz)  Height:    5\' 9"  (1.753 m)     General:  Appears calm and comfortable and is NAD Eyes:  PERRL, EOMI, normal lids, iris ENT:  grossly normal hearing, lips & tongue, mmm Neck:  no LAD, masses or thyromegaly Cardiovascular:  RRR, no m/r/g. No LE edema.  Respiratory:  CTA bilaterally, no w/r/r. Normal respiratory effort. Abdomen:  soft, ntnd, NABS Skin:  no rash or induration seen on limited exam Musculoskeletal:  grossly normal tone BUE/BLE, good ROM, no bony abnormality Psychiatric:  grossly normal mood and affect, speech fluent and appropriate, AOx3 Neurologic:  CN 2-12 grossly intact, moves all extremities in coordinated fashion, sensation intact  Labs on Admission: I have personally reviewed following labs and imaging studies  CBC:  Recent Labs Lab 11/16/15 1148  WBC 7.1  HGB 10.5*  HCT 32.5*  MCV 84.0  PLT 295   Basic Metabolic Panel:  Recent Labs Lab 11/16/15 1148  NA 137  K 4.4  CL 106  CO2 24  GLUCOSE 204*  BUN 24*  CREATININE 1.17  CALCIUM 8.8*   GFR: Estimated Creatinine Clearance: 77.5 mL/min (by C-G formula based on SCr of 1.17 mg/dL). Liver Function Tests: No results for input(s): AST, ALT,  ALKPHOS, BILITOT, PROT, ALBUMIN in the last 168 hours. No results for input(s): LIPASE, AMYLASE in the last 168 hours. No results for input(s): AMMONIA in the last 168 hours. Coagulation Profile: No results for input(s): INR, PROTIME in the last 168 hours. Cardiac Enzymes:  Recent Labs Lab 11/16/15 1148  TROPONINI <0.03   BNP (last 3 results) No results for input(s): PROBNP in the last 8760 hours. HbA1C: No results for input(s): HGBA1C in the last 72 hours. CBG: No results for input(s): GLUCAP in the last 168 hours. Lipid Profile: No results for input(s): CHOL, HDL, LDLCALC, TRIG, CHOLHDL, LDLDIRECT in the last 72 hours. Thyroid Function Tests: No results for input(s): TSH, T4TOTAL, FREET4, T3FREE, THYROIDAB in the last 72 hours. Anemia Panel: No results for input(s): VITAMINB12, FOLATE, FERRITIN, TIBC, IRON, RETICCTPCT in the last 72 hours. Urine analysis:    Component Value Date/Time   COLORURINE YELLOW 10/17/2015 [place] 10/17/2015 1154   LABSPEC 1.020 10/17/2015 1154   PHURINE 6.0 10/17/2015 1154   GLUCOSEU NEGATIVE 10/17/2015 1154   HGBUR SMALL (A) 10/17/2015 1154   BILIRUBINUR NEGATIVE 10/17/2015 1154   BILIRUBINUR neg 11/07/2014 1154   KETONESUR NEGATIVE 10/17/2015 1154   PROTEINUR 100 (A) 10/17/2015 1154   UROBILINOGEN negative 11/07/2014 1154   UROBILINOGEN 0.2 05/07/2013 0851   NITRITE NEGATIVE 10/17/2015 1154   LEUKOCYTESUR NEGATIVE 10/17/2015 1154    Creatinine Clearance: Estimated Creatinine Clearance: 77.5 mL/min (by C-G formula based on SCr of 1.17 mg/dL).  Sepsis Labs: @LABRCNTIP (procalcitonin:4,lacticidven:4) )No results found for this or any previous visit (from the past 240 hour(s)).   Radiological Exams on Admission: Dg Chest 2 View  Result Date: 11/16/2015 CLINICAL DATA:  Chest pain and shortness of breath. EXAM: CHEST  2 VIEW COMPARISON:  11/04/2015, 10/17/2015 and 04/17/2015 and 11/25/2014 FINDINGS: Increasing cardiomegaly.  Distention of the azygos  vein. Pulmonary vascularity is normal. Increasing bilateral pleural effusions and, peribronchial thickening and patchy infiltrates at the lung bases. Bones are normal. IMPRESSION: Increasing pleural effusions and patchy bilateral pulmonary infiltrates. This could represent atypical pulmonary edema. Electronically Signed   By: Lorriane Shire M.D.   On: 11/16/2015 11:56    EKG: Unable to review due to system malfunction.  Per Dr. Ellender Hose: NSR with rate 84; NSCSLT  Assessment/Plan Principal Problem:   CHEST PAIN UNSPECIFIED Active Problems:   Hyperlipemia   Tobacco dependence   Essential hypertension   Type 2 diabetes mellitus with complications (HCC)   CHF exacerbation (HCC)   Chest pain -Patient with substernal chest pain that came on at rest and comes and goes spontaneously. -1/3 typical symptoms suggestive of atypical vs. Noncardiac chest pain.  -CXR unremarkable.   -Initial cardiac enzymes negative.   -EKG not indicative of acute ischemia.   -TIMI risk score is 2; which predicts a 14 days risk of death, recurrent MI, or urgent revascularization of 8.3%.  -Will plan to place in observation status on telemetry to rule out ACS by overnight observation.  -cycle cardiac enzymes q6h x 3 and repeat EKG in AM -ASA PO daily -morphine given -Takes Crestor -Risk factor stratification with FLP and HgbA1c; will also check TSH and UDS -Cardiology consultation in AM, patient likely needs repeat stress testing  CHF exacerbation -Last echo in 5/16 showed EF 40-45% with grade 1 diastolic dysfunction -Will repeat Echo -Currently with mild exacerbation by history and exam -Received Lasix 60 mg IV in ER, will continue home dose Lasix PO for now  DM -Poorly controlled based on current glucose and prior A1c of 14.5 in 3/17 -Will repeat A1c -needs improved control -Hold Metformin -On Levemir 35 units BID, will continue and add SSI  HTN -Discontinue Norvasc due to  CHF -Continue Coreg and Lisinopril  HLD -Continue Crestor -Check FLP  Tobacco dependence -Encourage cessation.  This was discussed with the patient and should be reviewed on an ongoing basis.   -Patch ordered    DVT prophylaxis: Lovenox  Code Status:  Full - confirmed with patient Family Communication: None present  Disposition Plan:  Home once clinically improved Consults called: Cardiology in AM  Admission status: Observation, telemetry    Karmen Bongo MD [facility]  If 7PM-7AM, please contact night-coverage [email or web address] Password [facility]  11/16/2015, 10:07 PM

## 2015-11-16 NOTE — ED Notes (Signed)
Pt stable and ready for transport to [facility].  Report given to [facility], Therapist, sports.

## 2015-11-16 NOTE — ED Triage Notes (Signed)
PT c/o SOB x1 day on exertion or lying flat with productive clear sputum cough and no relief from home inhaler. PT states tightness to chest as well.

## 2015-11-16 NOTE — ED Provider Notes (Signed)
[facility] DEPT Provider Note   CSN: [id number] Arrival date & time: 11/16/15  1053     History   Chief Complaint Chief Complaint  Patient presents with  . Shortness of Breath    HPI Brad Mendoza is a 60 y.o. male.  HPI 60 year old male with past medical history of CHF, hypertension and hyperlipidemia who presents with shortness of breath. The patient states that over the last month he has had progressively worsening dyspnea with exertion. Over the last 4 hours, he has had significant increase in his lower extremity edema as well as shortness of breath. He also states that he was unable to sleep last night because he had to sit straight up. Due to worsening shortness of breath with lying flat. Of note, upon walking and exerting himself this morning he noticed a dull, substernal aching chest pressure that radiated to his left shoulder. This resolved with rest and has not returned. It did happen one more time and resolved with nitroglycerin. Denies any chest pain currently. Denies any fevers.  Past Medical History:  Diagnosis Date  . Acid reflux   . Back pain    f4 and f5  . CHF (congestive heart failure) ([place])    last echo was in March 2016  . Diabetes mellitus 2005  . Diverticulosis   . H/O chest pain 2005   ECHO  . HTN (hypertension)   . Hyperlipidemia   . Onychomycosis     Patient Active Problem List   Diagnosis Date Noted  . CHF exacerbation ([place]) 11/16/2015  . Acute left eye pain   . Diabetes mellitus due to underlying condition with hyperglycemia ([place])   . Hyperglycemia 05/08/2015  . Type 2 diabetes mellitus with hyperosmolar nonketotic hyperglycemia ([place]) 05/08/2015  . AKI (acute kidney injury) ([place]) 05/08/2015  . Hyponatremia 05/08/2015  . Hypochloremia 05/08/2015  . Type 2 diabetes mellitus with diabetic polyneuropathy ([place]) 12/16/2013  . CAD (coronary artery disease) 09/18/2013  . Heme positive stool 06/12/2013  . Change in stool 06/12/2013  .  Seasonal allergic rhinitis 07/11/2012  . Polysubstance abuse 12/24/2010  . Bronchitis 12/24/2010  . Onychomycosis   . Acid reflux   . Nonischemic cardiomyopathy ([place])   . Type 2 diabetes mellitus with complications ([place])   . Hyperlipemia 11/16/2009  . Obesity 11/16/2009  . NONDEPENDENT TOBACCO USE DISORDER 11/16/2009  . Essential hypertension 11/16/2009  . CHEST PAIN UNSPECIFIED 11/16/2009    Past Surgical History:  Procedure Laterality Date  . APPENDECTOMY    . CARDIAC CATHETERIZATION  2005   4 frech cath  . COLONOSCOPY N/A 08/01/2013   Procedure: COLONOSCOPY;  Surgeon: Rogene Houston, MD;  Location[facility];  Service: Endoscopy;  Laterality: N/A;  rescheduled to [place] notified pt  . ESOPHAGOGASTRODUODENOSCOPY N/A 04/23/2015   Procedure: ESOPHAGOGASTRODUODENOSCOPY (EGD);  Surgeon: Rogene Houston, MD;  Location: [facility];  Service: Endoscopy;  Laterality: N/A;  1:25       Home Medications    Prior to Admission medications   Medication Sig Start Date End Date Taking? Authorizing Provider  albuterol (PROVENTIL HFA;VENTOLIN HFA) 108 (90 Base) MCG/ACT inhaler Inhale 2 puffs into the lungs every 6 (six) hours as needed for wheezing or shortness of breath. 05/21/15   [name], MD  amLODipine (NORVASC) 10 MG tablet Take 1 tablet (10 mg total) by mouth daily. 10/23/15   [name], MD  aspirin EC 81 MG tablet Take 81 mg by mouth daily.  Historical Provider, MD  Capsaicin (ICY HOT ARTHRITIS THERAPY EX) Apply 1 application topically 2 (two) times daily as needed (back pain).     Historical Provider, MD  carvedilol (COREG) 12.5 MG tablet Take 1 tablet (12.5 mg total) by mouth 2 (two) times daily with a meal. 10/23/15   [name], MD  fluticasone [facility]) 50 MCG/ACT nasal spray USE 2 SPRAYS IN [facility] NOSTRIL DAILY 08/12/15   [name], MD  furosemide (LASIX) 40 MG tablet Take 1.5 tablets (60 mg total) by mouth daily. 10/30/15 11/29/15  Lendon Colonel, NP   gabapentin (NEURONTIN) 300 MG capsule Take 1 capsule (300 mg total) by mouth 3 (three) times daily. 10/23/15   [name], MD  Insulin Detemir (LEVEMIR FLEXTOUCH) 100 UNIT/ML Pen INJECT 35 UNITS SQ TWICE DAILY WITH BREAKFAST AND SUPPER 10/23/15   [name], MD  insulin lispro (HUMALOG KWIKPEN) 100 UNIT/ML KiwkPen INJECT UP TO 15 UNITS PRIOR TO EACH MEALAS NEEDED FOR ELAVATED BG 10/23/15   [name], MD  isosorbide mononitrate (IMDUR) 60 MG 24 hr tablet Take 1.5 tablets (90 mg total) by mouth daily. 10/23/15   [name], MD  levofloxacin (LEVAQUIN) 750 MG tablet Take 1 tablet (750 mg total) by mouth daily. 11/04/15   Daleen Bo, MD  lisinopril (PRINIVIL,ZESTRIL) 40 MG tablet Take 1 tablet (40 mg total) by mouth daily. 10/23/15   [name], MD  metFORMIN (GLUCOPHAGE-XR) 500 MG 24 hr tablet TAKE 1 TABLET EVERY MORNING WITH BREAKFAST Patient taking differently: TAKE 1 TABLET EVERY MORNING WITH BREAKFAST AS NEEDED FOR BLOOD SUGARS OVER 300 12/23/14   Cherre Robins, PharmD  metoCLOPramide (REGLAN) 10 MG tablet Take 1 tablet (10 mg total) by mouth 3 (three) times daily before meals. Patient not taking: Reported on 11/04/2015 04/23/15   Rogene Houston, MD  Multiple Vitamins-Minerals (CENTRUM SILVER ADULT 50+ PO) Take 1 tablet by mouth daily.    Historical Provider, MD  nitroGLYCERIN (NITROSTAT) 0.4 MG SL tablet Place 1 tablet (0.4 mg total) under the tongue every 5 (five) minutes as needed. 10/08/15   [name], MD  Omega-3 Fatty Acids (FISH OIL) 1000 MG CPDR Take 1 capsule by mouth daily.    Historical Provider, MD  pantoprazole (PROTONIX) 40 MG tablet Take 1 tablet (40 mg total) by mouth daily. 10/23/15   [name], MD  potassium chloride SA (K-DUR,KLOR-CON) 20 MEQ tablet Take as needed when need to take furosemide 10/30/15   Lendon Colonel, NP  rosuvastatin (CRESTOR) 10 MG tablet Take 1 tablet (10 mg total) by mouth daily. 10/23/15   [name], MD    Family  History Family History  Problem Relation Age of Onset  . Hypertension Father   . Diabetes Father   . Cancer Mother   . Alcohol abuse Brother   . Colon cancer Neg Hx     Social History Social History  Substance Use Topics  . Smoking status: Never Smoker  . Smokeless tobacco: Current User    Types: Snuff     Comment: dips snuff about 6 cans/week for 3 years  . Alcohol use 0.6 oz/week    1 Cans of beer per week     Comment: occ      Allergies   Sulfa antibiotics and Penicillins   Review of Systems Review of Systems  Constitutional: Positive for fatigue. Negative for chills and fever.  HENT: Negative for congestion and rhinorrhea.   Eyes: Negative  for visual disturbance.  Respiratory: Positive for cough, chest tightness and shortness of breath. Negative for wheezing.   Cardiovascular: Positive for chest pain and leg swelling.  Gastrointestinal: Negative for abdominal pain, diarrhea, nausea and vomiting.  Genitourinary: Negative for dysuria and flank pain.  Musculoskeletal: Negative for neck pain and neck stiffness.  Skin: Negative for rash and wound.  Allergic/Immunologic: Negative for immunocompromised state.  Neurological: Negative for syncope, weakness and headaches.  All other systems reviewed and are negative.    Physical Exam Updated Vital Signs BP 148/96   Pulse 96   Temp 98 F (36.7 C) (Oral)   Resp (!) 30   Ht 5\' 9"  (1.753 m)   Wt 208 lb (94.3 kg)   SpO2 95%   BMI 30.72 kg/m   Physical Exam  Constitutional: He is oriented to person, place, and time. He appears well-developed and well-nourished. No distress.  HENT:  Head: Normocephalic and atraumatic.  Eyes: Conjunctivae are normal.  Neck: Neck supple.  Cardiovascular: Normal rate, regular rhythm and normal heart sounds.  Exam reveals no friction rub.   No murmur heard. Pulmonary/Chest: [place] normal. No respiratory distress. He has no wheezes. He has rales in the right lower field and the left  lower field.  Abdominal: He exhibits no distension.  Musculoskeletal: He exhibits edema (Bilateral 2+ pitting edema of lower extremities).  Neurological: He is alert and oriented to person, place, and time. He exhibits normal muscle tone.  Skin: Skin is warm. Capillary refill takes less than 2 seconds.  Psychiatric: He has a normal mood and affect.  Nursing note and vitals reviewed.    ED Treatments / Results  Labs (all labs ordered are listed, but only abnormal results are displayed) Labs Reviewed  BASIC METABOLIC PANEL - Abnormal; Notable for the following:       Result Value   Glucose, Bld 204 (*)    BUN 24 (*)    Calcium 8.8 (*)    All other components within normal limits  CBC - Abnormal; Notable for the following:    RBC 3.87 (*)    Hemoglobin 10.5 (*)    HCT 32.5 (*)    All other components within normal limits  BRAIN NATRIURETIC PEPTIDE - Abnormal; Notable for the following:    B Natriuretic Peptide 229.0 (*)    All other components within normal limits  TROPONIN I    EKG  EKG Interpretation  Date/Time:  Monday November 16 2015 11:20:43 EDT Ventricular Rate:  84 PR Interval:  170 QRS Duration: 86 QT Interval:  398 QTC Calculation: 470 R Axis:   22 Text Interpretation:  Normal sinus rhythm Possible Anterior infarct , age undetermined Abnormal ECG No significant change since last tracing Confirmed by Birney Belshe MD, Jalei Shibley [phone]) on 11/16/2015 4:43:46 PM       Radiology Dg Chest 2 View  Result Date: 11/16/2015 CLINICAL DATA:  Chest pain and shortness of breath. EXAM: CHEST  2 VIEW COMPARISON:  11/04/2015, 10/17/2015 and 04/17/2015 and 11/25/2014 FINDINGS: Increasing cardiomegaly. Distention of the azygos vein. Pulmonary vascularity is normal. Increasing bilateral pleural effusions and, peribronchial thickening and patchy infiltrates at the lung bases. Bones are normal. IMPRESSION: Increasing pleural effusions and patchy bilateral pulmonary infiltrates. This could  represent atypical pulmonary edema. Electronically Signed   By: Lorriane Shire M.D.   On: 11/16/2015 11:56    Procedures Procedures (including critical care time)  Medications Ordered in ED Medications  furosemide (LASIX) injection 60 mg (60 mg Intravenous Given 11/16/15  1654)  aspirin chewable tablet 324 mg (324 mg Oral Given 11/16/15 1654)     Initial Impression / Assessment and Plan / ED Course  I have reviewed the triage vital signs and the nursing notes.  Pertinent labs & imaging results that were available during my care of the patient were reviewed by me and considered in my medical decision making (see chart for details).  Clinical Course   60 yo M with PMHx of CHF, HTN, HLD who p/w SOB. On arrival, VSS and WNL, with mild HTN. On exam, pt hypervolemic with b/l pitting edema. Labs, imaging reviewed as above. BNP elevated, CXR c/w worsening pulmonary edema. Trop neg and EKG is non-ischemic. Labs o/w at baseline. Suspect acute on chronic CHF exacerbation, with possible demand-related angina. No STEMI or signs of active ischemia. Will give lasix IV, admit to medicine.  Final Clinical Impressions(s) / ED Diagnoses   Final diagnoses:  Acute on chronic systolic congestive heart failure (HCC)  Elevated brain natriuretic peptide (BNP) level     Duffy Bruce, MD 11/17/15 [phone]

## 2015-11-17 ENCOUNTER — Observation Stay (HOSPITAL_COMMUNITY): Payer: Medicare Other

## 2015-11-17 DIAGNOSIS — I5023 Acute on chronic systolic (congestive) heart failure: Secondary | ICD-10-CM

## 2015-11-17 DIAGNOSIS — Z794 Long term (current) use of insulin: Secondary | ICD-10-CM | POA: Diagnosis not present

## 2015-11-17 DIAGNOSIS — I25118 Atherosclerotic heart disease of native coronary artery with other forms of angina pectoris: Secondary | ICD-10-CM

## 2015-11-17 DIAGNOSIS — I5043 Acute on chronic combined systolic (congestive) and diastolic (congestive) heart failure: Secondary | ICD-10-CM | POA: Diagnosis not present

## 2015-11-17 DIAGNOSIS — E785 Hyperlipidemia, unspecified: Secondary | ICD-10-CM | POA: Diagnosis not present

## 2015-11-17 DIAGNOSIS — Z7951 Long term (current) use of inhaled steroids: Secondary | ICD-10-CM | POA: Diagnosis not present

## 2015-11-17 DIAGNOSIS — F17200 Nicotine dependence, unspecified, uncomplicated: Secondary | ICD-10-CM | POA: Diagnosis present

## 2015-11-17 DIAGNOSIS — Z880 Allergy status to penicillin: Secondary | ICD-10-CM | POA: Diagnosis not present

## 2015-11-17 DIAGNOSIS — I272 Other secondary pulmonary hypertension: Secondary | ICD-10-CM | POA: Diagnosis present

## 2015-11-17 DIAGNOSIS — R079 Chest pain, unspecified: Secondary | ICD-10-CM | POA: Diagnosis not present

## 2015-11-17 DIAGNOSIS — E1165 Type 2 diabetes mellitus with hyperglycemia: Secondary | ICD-10-CM | POA: Diagnosis present

## 2015-11-17 DIAGNOSIS — I509 Heart failure, unspecified: Secondary | ICD-10-CM

## 2015-11-17 DIAGNOSIS — Z79899 Other long term (current) drug therapy: Secondary | ICD-10-CM | POA: Diagnosis not present

## 2015-11-17 DIAGNOSIS — K219 Gastro-esophageal reflux disease without esophagitis: Secondary | ICD-10-CM | POA: Diagnosis present

## 2015-11-17 DIAGNOSIS — I10 Essential (primary) hypertension: Secondary | ICD-10-CM | POA: Diagnosis not present

## 2015-11-17 DIAGNOSIS — Z23 Encounter for immunization: Secondary | ICD-10-CM | POA: Diagnosis not present

## 2015-11-17 DIAGNOSIS — Z833 Family history of diabetes mellitus: Secondary | ICD-10-CM | POA: Diagnosis not present

## 2015-11-17 DIAGNOSIS — Z882 Allergy status to sulfonamides status: Secondary | ICD-10-CM | POA: Diagnosis not present

## 2015-11-17 DIAGNOSIS — I428 Other cardiomyopathies: Secondary | ICD-10-CM | POA: Diagnosis present

## 2015-11-17 DIAGNOSIS — I2510 Atherosclerotic heart disease of native coronary artery without angina pectoris: Secondary | ICD-10-CM | POA: Diagnosis present

## 2015-11-17 DIAGNOSIS — I110 Hypertensive heart disease with heart failure: Secondary | ICD-10-CM | POA: Diagnosis present

## 2015-11-17 DIAGNOSIS — I5033 Acute on chronic diastolic (congestive) heart failure: Secondary | ICD-10-CM | POA: Diagnosis not present

## 2015-11-17 DIAGNOSIS — Z7982 Long term (current) use of aspirin: Secondary | ICD-10-CM | POA: Diagnosis not present

## 2015-11-17 DIAGNOSIS — R633 Feeding difficulties: Secondary | ICD-10-CM

## 2015-11-17 LAB — GLUCOSE, CAPILLARY
GLUCOSE-CAPILLARY: 171 mg/dL — AB (ref 65–99)
GLUCOSE-CAPILLARY: 89 mg/dL (ref 65–99)
GLUCOSE-CAPILLARY: 66 mg/dL (ref 65–99)
Glucose-Capillary: 148 mg/dL — ABNORMAL HIGH (ref 65–99)

## 2015-11-17 LAB — RAPID URINE DRUG SCREEN, HOSP PERFORMED
Amphetamines: NOT DETECTED
BENZODIAZEPINES: NOT DETECTED
Barbiturates: NOT DETECTED
Cocaine: NOT DETECTED
OPIATES: POSITIVE — AB
Tetrahydrocannabinol: POSITIVE — AB

## 2015-11-17 LAB — TROPONIN I

## 2015-11-17 MED ORDER — FUROSEMIDE 10 MG/ML IJ SOLN
40.0000 mg | Freq: Two times a day (BID) | INTRAMUSCULAR | Status: DC
  Administered 2015-11-17 – 2015-11-20 (×7): 40 mg via INTRAVENOUS
  Filled 2015-11-17 (×7): qty 4

## 2015-11-17 NOTE — Consult Note (Signed)
CARDIOLOGY CONSULT NOTE  Patient ID: DAEQUAN KOZMA MRN: [id number] DOB/AGE: 07-20-1955 60 y.o.  Admit date: 11/16/2015 Primary Physician: [name], MD Referring Physician: Lorin Mercy MD  Reason for Consultation: CHF, chest pain  HPI: 60 yr old well known to me from clinic. He has a history of chronic systolic heart failure, coronary artery disease, hypertension and hyperlipidemia. He has a history of a nonischemic cardiomyopathy. In 2005, a 50% LAD lesion was noted. Nuclear stress testing in October 2014 demonstrated anterior wall and anteroseptal infarcts with no suggestion of ischemia.  Echocardiogram on 07/17/14 demonstrated mild to moderately reduced left ventricular systolic function, EF 31-51%, with grade 1 diastolic dysfunction, mild left atrial enlargement. Nuclear stress testing in October 2014 demonstrated anterior wall and anteroseptal infarcts with no suggestion of ischemia.   For the past 3 weeks, he has been experiencing progressive exertional dyspnea and orthopnea. Eats ground [place] and [place] sausage. Does not add salt to foods. In past 2 days, he has been eating sour cream, cheese, and potato chips. He then developed worsening orthopnea and SOB with concomitant leg swelling.  Has occasional chest tightness. Also complains of neck pain radiating down back.    Allergies  Allergen Reactions  . Sulfa Antibiotics Shortness Of Breath  . Penicillins Rash    Has patient had a PCN reaction causing immediate rash, facial/tongue/throat swelling, SOB or lightheadedness with hypotension: No Has patient had a PCN reaction causing severe rash involving mucus membranes or skin necrosis: No Has patient had a PCN reaction that required hospitalization No Has patient had a PCN reaction occurring within the last 10 years: No If all of the above answers are "NO", then may proceed with Cephalosporin use.     Current Facility-Administered Medications  Medication Dose  Route Frequency Provider Last Rate Last Dose  . acetaminophen (TYLENOL) tablet 650 mg  650 mg Oral Q4H PRN Karmen Bongo, MD      . albuterol (PROVENTIL) (2.5 MG/3ML) 0.083% nebulizer solution 3 mL  3 mL Inhalation Q6H PRN Karmen Bongo, MD      . aspirin EC tablet 81 mg  81 mg Oral Daily Karmen Bongo, MD   81 mg at 11/16/15 2140  . carvedilol (COREG) tablet 12.5 mg  12.5 mg Oral BID WC Karmen Bongo, MD   12.5 mg at 11/16/15 2139  . enoxaparin (LOVENOX) injection 40 mg  40 mg Subcutaneous Q24H Karmen Bongo, MD   40 mg at 11/16/15 2139  . fluticasone (FLONASE) 50 MCG/ACT nasal spray 2 spray  2 spray Each Nare Daily Karmen Bongo, MD   2 spray at 11/17/15 1000  . furosemide (LASIX) tablet 60 mg  60 mg Oral Daily Karmen Bongo, MD      . gabapentin (NEURONTIN) capsule 300 mg  300 mg Oral TID Karmen Bongo, MD   300 mg at 11/16/15 2140  . gi cocktail (Maalox,Lidocaine,Donnatal)  30 mL Oral QID PRN Karmen Bongo, MD      . insulin aspart (novoLOG) injection 0-15 Units  0-15 Units Subcutaneous TID WC Karmen Bongo, MD   3 Units at 11/17/15 0800  . insulin detemir (LEVEMIR) injection 35 Units  35 Units Subcutaneous BID AC Karmen Bongo, MD   35 Units at 11/17/15 0800  . isosorbide mononitrate (IMDUR) 24 hr tablet 90 mg  90 mg Oral Daily Karmen Bongo, MD      . lisinopril (PRINIVIL,ZESTRIL) tablet 40 mg  40 mg Oral Daily Karmen Bongo, MD   40  mg at 11/16/15 2140  . morphine 2 MG/ML injection 2 mg  2 mg Intravenous Q2H PRN Karmen Bongo, MD   2 mg at 11/16/15 2141  . nicotine (NICODERM CQ - dosed in mg/24 hours) patch 14 mg  14 mg Transdermal Daily Karmen Bongo, MD      . nitroGLYCERIN (NITROSTAT) SL tablet 0.4 mg  0.4 mg Sublingual Q5 min PRN Karmen Bongo, MD      . ondansetron [facility]) injection 4 mg  4 mg Intravenous Q6H PRN Karmen Bongo, MD      . pantoprazole (PROTONIX) EC tablet 40 mg  40 mg Oral Daily Karmen Bongo, MD   40 mg at 11/16/15 2139  . potassium chloride SA  (K-DUR,KLOR-CON) CR tablet 20 mEq  20 mEq Oral Daily Karmen Bongo, MD      . rosuvastatin (CRESTOR) tablet 10 mg  10 mg Oral q1800 Karmen Bongo, MD   10 mg at 11/16/15 2138    Past Medical History:  Diagnosis Date  . Acid reflux   . Back pain    f4 and f5  . CHF (congestive heart failure) ([place])    last echo was in March 2016  . Diabetes mellitus 2005  . Diverticulosis   . H/O chest pain 2005   ECHO  . HTN (hypertension)   . Hyperlipidemia   . Onychomycosis     Past Surgical History:  Procedure Laterality Date  . APPENDECTOMY    . CARDIAC CATHETERIZATION  2005   4 frech cath  . COLONOSCOPY N/A 08/01/2013   Procedure: COLONOSCOPY;  Surgeon: Rogene Houston, MD;  Location: [facility];  Service: Endoscopy;  Laterality: N/A;  rescheduled to [place] notified pt  . ESOPHAGOGASTRODUODENOSCOPY N/A 04/23/2015   Procedure: ESOPHAGOGASTRODUODENOSCOPY (EGD);  Surgeon: Rogene Houston, MD;  Location: [facility];  Service: Endoscopy;  Laterality: N/A;  1:25    Social History   Social History  . Marital status: Married    Spouse name: N/A  . Number of children: N/A  . Years of education: N/A   Occupational History  . unemployed    Social History Main Topics  . Smoking status: Never Smoker  . Smokeless tobacco: Current User    Types: Snuff     Comment: dips snuff about 6 cans/week for 3 years  . Alcohol use 0.6 oz/week    1 Cans of beer per week     Comment: occ   . Drug use:     Types: Marijuana     Comment: last use 3-4 days ago  . Sexual activity: Yes   Other Topics Concern  . Not on file   Social History Narrative  . No narrative on file     No family history of premature CAD in 1st degree relatives.  Prior to Admission medications   Medication Sig Start Date End Date Taking? Authorizing Provider  albuterol (PROVENTIL HFA;VENTOLIN HFA) 108 (90 Base) MCG/ACT inhaler Inhale 2 puffs into the lungs every 6 (six) hours as needed for wheezing or shortness of  breath. 05/21/15  Yes [name], MD  amLODipine (NORVASC) 10 MG tablet Take 1 tablet (10 mg total) by mouth daily. 10/23/15  Yes [name], MD  aspirin EC 81 MG tablet Take 81 mg by mouth daily.   Yes Historical Provider, MD  carvedilol (COREG) 12.5 MG tablet Take 1 tablet (12.5 mg total) by mouth 2 (two) times daily with a meal. 10/23/15  Yes [name], MD  fluticasone (FLONASE) 50 MCG/ACT nasal spray USE 2 SPRAYS IN [facility] NOSTRIL DAILY 08/12/15  Yes [name], MD  furosemide (LASIX) 40 MG tablet Take 1.5 tablets (60 mg total) by mouth daily. 10/30/15 11/29/15 Yes Lendon Colonel, NP  Insulin Detemir (LEVEMIR FLEXTOUCH) 100 UNIT/ML Pen INJECT 35 UNITS SQ TWICE DAILY WITH BREAKFAST AND SUPPER 10/23/15  Yes [name], MD  insulin lispro (HUMALOG KWIKPEN) 100 UNIT/ML KiwkPen INJECT UP TO 15 UNITS PRIOR TO EACH MEALAS NEEDED FOR ELAVATED BG 10/23/15  Yes [name], MD  isosorbide mononitrate (IMDUR) 60 MG 24 hr tablet Take 1.5 tablets (90 mg total) by mouth daily. 10/23/15  Yes [name], MD  Multiple Vitamins-Minerals (CENTRUM SILVER ADULT 50+ PO) Take 1 tablet by mouth daily.   Yes Historical Provider, MD  nitroGLYCERIN (NITROSTAT) 0.4 MG SL tablet Place 1 tablet (0.4 mg total) under the tongue every 5 (five) minutes as needed. 10/08/15  Yes [name], MD  Omega-3 Fatty Acids (FISH OIL) 1000 MG CPDR Take 1 capsule by mouth daily.   Yes Historical Provider, MD  pantoprazole (PROTONIX) 40 MG tablet Take 1 tablet (40 mg total) by mouth daily. 10/23/15  Yes [name], MD  potassium chloride SA (K-DUR,KLOR-CON) 20 MEQ tablet Take as needed when need to take furosemide Patient taking differently: Take 20 mEq by mouth daily.  10/30/15  Yes Lendon Colonel, NP  rosuvastatin (CRESTOR) 10 MG tablet Take 1 tablet (10 mg total) by mouth daily. 10/23/15  Yes [name], MD  Capsaicin (ICY HOT ARTHRITIS THERAPY EX) Apply 1 application topically 2 (two) times daily  as needed (back pain).     Historical Provider, MD  gabapentin (NEURONTIN) 300 MG capsule Take 1 capsule (300 mg total) by mouth 3 (three) times daily. Patient not taking: Reported on 11/16/2015 10/23/15   [name], MD  lisinopril (PRINIVIL,ZESTRIL) 40 MG tablet Take 1 tablet (40 mg total) by mouth daily. 10/23/15   [name], MD     Review of systems complete and found to be negative unless listed above in HPI     Physical exam Blood pressure 124/77, pulse 73, temperature 98.6 F (37 C), temperature source Oral, resp. rate (!) 24, height 5\' 9"  (1.753 m), weight 210 lb 8.6 oz (95.5 kg), SpO2 94 %. General: NAD Neck: No JVD, no thyromegaly or thyroid nodule.  Lungs: Clear to auscultation bilaterally with normal respiratory effort. CV: Nondisplaced PMI. Regular rate and rhythm, normal S1/S2, no S3/S4, no murmur.  2+ pitting pretibial edema.    Abdomen: Soft, nontender.  Skin: Intact without lesions or rashes.  Neurologic: Alert and oriented x 3.  Psych: Normal affect. Extremities: No clubbing or cyanosis.  HEENT: Normal.   ECG: Most recent ECG reviewed.  Labs:   Lab Results  Component Value Date   WBC 7.1 11/16/2015   HGB 10.5 (L) 11/16/2015   HCT 32.5 (L) 11/16/2015   MCV 84.0 11/16/2015   PLT 221 11/16/2015    Recent Labs Lab 11/16/15 1148  NA 137  K 4.4  CL 106  CO2 24  BUN 24*  CREATININE 1.17  CALCIUM 8.8*  GLUCOSE 204*   Lab Results  Component Value Date   CKTOTAL 469 (H) 04/14/2010   CKMB (HH) 04/14/2010    6.8 REPEATED TO VERIFY CRITICAL RESULT CALLED TO, READ BACK BY AND VERIFIED WITH: LILLY,S RN [phone] DLONG   TROPONINI <0.03 11/17/2015    Lab Results  Component Value Date   CHOL 130 11/16/2015   CHOL 155 10/23/2015   CHOL 287 (H) 10/23/2014   Lab Results  Component Value Date   HDL 45 11/16/2015   HDL 44 10/23/2015   HDL 46 10/23/2014   Lab Results  Component Value Date   LDLCALC 67 11/16/2015   LDLCALC 83 10/23/2015    LDLCALC 166 (H) 10/23/2014   Lab Results  Component Value Date   TRIG 91 11/16/2015   TRIG 140 10/23/2015   TRIG 376 (H) 10/23/2014   Lab Results  Component Value Date   CHOLHDL 2.9 11/16/2015   CHOLHDL 3.5 10/23/2015   CHOLHDL 6.2 (H) 10/23/2014   No results found for: LDLDIRECT       Studies: Dg Chest 2 View  Result Date: 11/16/2015 CLINICAL DATA:  Chest pain and shortness of breath. EXAM: CHEST  2 VIEW COMPARISON:  11/04/2015, 10/17/2015 and 04/17/2015 and 11/25/2014 FINDINGS: Increasing cardiomegaly. Distention of the azygos vein. Pulmonary vascularity is normal. Increasing bilateral pleural effusions and, peribronchial thickening and patchy infiltrates at the lung bases. Bones are normal. IMPRESSION: Increasing pleural effusions and patchy bilateral pulmonary infiltrates. This could represent atypical pulmonary edema. Electronically Signed   By: Lorriane Shire M.D.   On: 11/16/2015 11:56    ASSESSMENT AND PLAN:  1. Acute on chronic systolic heart failure: Appears to have been due to dietary indiscretion. I educated him on the importance of this. Troponins normal, ECG with nonspecific T wave abnormalities which aren't new. Will change Lasix to IV 40 mg BID as he has marked pedal edema and remains SOB. I may consider an outpatient stress test. BP is controlled. Continue Coreg, nitrates, and lisinopril.  2. CAD (50% prior LAD lesion):Symptomatically stable for the most part on Imdur 90 mg daily. Troponins normal. Previous stress test showed only anterior and anteroseptal wall infarct without ischemia. Continue ASA, Coreg, nitrates, amlodipine, and Crestor. I may consider an outpatient stress test.  3. Essential HTN: Controlled. No changes.  4. Hyperlipidemia: Continue Crestor. LDL down to 67, had been 166 10/2014.    Signed: Kate Sable, M.D., F.A.C.C.  11/17/2015, 10:51 AM

## 2015-11-17 NOTE — Care Management Obs Status (Signed)
[facility] NOTIFICATION   Patient Details  Name: GEROGE GILLIAM MRN: [id number] Date of Birth: 1955/07/02   Medicare Observation Status Notification Given:  Yes    Sherald Barge, RN 11/17/2015, 2:13 PM

## 2015-11-17 NOTE — Progress Notes (Signed)
PROGRESS NOTE    WESSLEY EMERT  NWG:[id number] DOB: 09-10-1955 DOA: 11/16/2015 PCP: [name], MD     Brief Narrative:  60 y/o man admitted from home on 9/25 with SOB and CP.   Assessment & Plan:   Principal Problem:   CHEST PAIN UNSPECIFIED Active Problems:   Hyperlipemia   Tobacco dependence   Essential hypertension   Type 2 diabetes mellitus with complications (HCC)   CHF exacerbation (HCC)   Chest Pain -Troponins have been negative, EKG without acute ischemic changes. -Seen by cardiology, no further inpatient work up recommended, altho they may consider an OP stress test.  Acute on Chronic Combined CHF -ECHO 5/16: EF 40-45% and grade 1 diastolic dysfunction. -Still markedly volume overloaded on exam. -Daily weights, strict Is and Os. -Cards has increased lasix to 40 mg IV BID. -nutrition evaluation to enforce education on diet compliance. -Continue coreg, nitrates and ACE-I.  HTN -Well controlled. -No medication changes at present.  Hyperlipidemia -Continue crestor.  Tobacco Abuse -Counseled on cessation.  DM -Fair control, continue current regimen.    DVT prophylaxis: lovenox Code Status: full code Family Communication: patient only Disposition Plan: to be determined  Consultants:   cardiology  Procedures:   none  Antimicrobials:   none    Subjective: Feels improved, CP has resolved  Objective: Vitals:   11/16/15 1730 11/16/15 1800 11/16/15 2111 11/17/15 0451  BP: 148/96 164/93 139/90 124/77  Pulse: 96 81 87 73  Resp: (!) 30 24 (!) 22 (!) 24  Temp:   98.2 F (36.8 C) 98.6 F (37 C)  TempSrc:   Oral Oral  SpO2: 95% 93% 100% 94%  Weight:   95.5 kg (210 lb 8.6 oz)   Height:   5\' 9"  (1.753 m)     Intake/Output Summary (Last 24 hours) at 11/17/15 1155 Last data filed at 11/17/15 0826  Gross per 24 hour  Intake                0 ml  Output              200 ml  Net             -200 ml   Filed Weights   11/16/15 1122  11/16/15 2111  Weight: 94.3 kg (208 lb) 95.5 kg (210 lb 8.6 oz)    Examination:  General exam: Alert, awake, oriented x 3 Respiratory system: Clear to auscultation. Respiratory effort normal. Cardiovascular system:RRR. No murmurs, rubs, gallops. Gastrointestinal system: Abdomen is nondistended, soft and nontender. No organomegaly or masses felt. Normal bowel sounds heard. Central nervous system: Alert and oriented. No focal neurological deficits. Extremities: 3++ edema bilaterally Psychiatry: Judgement and insight appear normal. Mood & affect appropriate.     Data Reviewed: I have personally reviewed following labs and imaging studies  CBC:  Recent Labs Lab 11/16/15 1148  WBC 7.1  HGB 10.5*  HCT 32.5*  MCV 84.0  PLT 578   Basic Metabolic Panel:  Recent Labs Lab 11/16/15 1148  NA 137  K 4.4  CL 106  CO2 24  GLUCOSE 204*  BUN 24*  CREATININE 1.17  CALCIUM 8.8*   GFR: Estimated Creatinine Clearance: 77.5 mL/min (by C-G formula based on SCr of 1.17 mg/dL). Liver Function Tests: No results for input(s): AST, ALT, ALKPHOS, BILITOT, PROT, ALBUMIN in the last 168 hours. No results for input(s): LIPASE, AMYLASE in the last 168 hours. No results for input(s): AMMONIA in the last 168 hours. Coagulation Profile:  No results for input(s): INR, PROTIME in the last 168 hours. Cardiac Enzymes:  Recent Labs Lab 11/16/15 1148 11/16/15 2054 11/17/15 0237 11/17/15 0836  TROPONINI <0.03 <0.03 <0.03 <0.03   BNP (last 3 results) No results for input(s): PROBNP in the last 8760 hours. HbA1C: No results for input(s): HGBA1C in the last 72 hours. CBG:  Recent Labs Lab 11/16/15 2338 11/17/15 0730 11/17/15 1150  GLUCAP 209* 171* 89   Lipid Profile:  Recent Labs  11/16/15 2054  CHOL 130  HDL 45  LDLCALC 67  TRIG 91  CHOLHDL 2.9   Thyroid Function Tests:  Recent Labs  11/16/15 1148  TSH 1.892   Anemia Panel: No results for input(s): VITAMINB12, FOLATE,  FERRITIN, TIBC, IRON, RETICCTPCT in the last 72 hours. Urine analysis:    Component Value Date/Time   COLORURINE YELLOW 10/17/2015 [place] 10/17/2015 1154   LABSPEC 1.020 10/17/2015 1154   PHURINE 6.0 10/17/2015 1154   GLUCOSEU NEGATIVE 10/17/2015 1154   HGBUR SMALL (A) 10/17/2015 1154   BILIRUBINUR NEGATIVE 10/17/2015 1154   BILIRUBINUR neg 11/07/2014 1154   KETONESUR NEGATIVE 10/17/2015 1154   PROTEINUR 100 (A) 10/17/2015 1154   UROBILINOGEN negative 11/07/2014 1154   UROBILINOGEN 0.2 05/07/2013 0851   NITRITE NEGATIVE 10/17/2015 1154   LEUKOCYTESUR NEGATIVE 10/17/2015 1154   Sepsis Labs: @LABRCNTIP (procalcitonin:4,lacticidven:4)  )No results found for this or any previous visit (from the past 240 hour(s)).       Radiology Studies: Dg Chest 2 View  Result Date: 11/16/2015 CLINICAL DATA:  Chest pain and shortness of breath. EXAM: CHEST  2 VIEW COMPARISON:  11/04/2015, 10/17/2015 and 04/17/2015 and 11/25/2014 FINDINGS: Increasing cardiomegaly. Distention of the azygos vein. Pulmonary vascularity is normal. Increasing bilateral pleural effusions and, peribronchial thickening and patchy infiltrates at the lung bases. Bones are normal. IMPRESSION: Increasing pleural effusions and patchy bilateral pulmonary infiltrates. This could represent atypical pulmonary edema. Electronically Signed   By: Lorriane Shire M.D.   On: 11/16/2015 11:56        Scheduled Meds: . aspirin EC  81 mg Oral Daily  . carvedilol  12.5 mg Oral BID WC  . enoxaparin (LOVENOX) injection  40 mg Subcutaneous Q24H  . fluticasone  2 spray Each Nare Daily  . furosemide  40 mg Intravenous BID  . gabapentin  300 mg Oral TID  . insulin aspart  0-15 Units Subcutaneous TID WC  . insulin detemir  35 Units Subcutaneous BID AC  . isosorbide mononitrate  90 mg Oral Daily  . lisinopril  40 mg Oral Daily  . nicotine  14 mg Transdermal Daily  . pantoprazole  40 mg Oral Daily  . potassium chloride SA   20 mEq Oral Daily  . rosuvastatin  10 mg Oral q1800   Continuous Infusions:    LOS: 0 days    Time spent: 25 minutes. Greater than 50% of this time was spent in direct contact with the patient coordinating care.     Lelon Frohlich, MD [facility] Pager [phone]  If 7PM-7AM, please contact night-coverage [email or web address] Password [facility] 11/17/2015, 11:55 AM

## 2015-11-17 NOTE — Care Management Note (Signed)
Case Management Note  Patient Details  Name: Brad Mendoza MRN: [id number] Date of Birth: 04-13-1955  Subjective/Objective:                  Pt admitted with CP/CHF. He is from home and is ind with ADL's. He has PCP, transportation and no difficulty affording medications. He has no HH services or DME needs PTA. He plans on returning home with self care at DC.   Action/Plan: No CM needs anticipated.     Expected Discharge Date:  11/19/15               Expected Discharge Plan:  Home/Self Care  In-House Referral:  NA  Discharge planning Services  CM Consult  Post Acute Care Choice:  NA Choice offered to:  NA  DME Arranged:    DME Agency:     HH Arranged:    HH Agency:     Status of Service:  Completed, signed off  If discussed at [employer] of Stay Meetings, dates discussed:    Additional Comments:  Sherald Barge, RN 11/17/2015, 2:12 PM

## 2015-11-18 ENCOUNTER — Other Ambulatory Visit: Payer: Self-pay | Admitting: *Deleted

## 2015-11-18 ENCOUNTER — Inpatient Hospital Stay (HOSPITAL_COMMUNITY): Payer: Medicare Other

## 2015-11-18 DIAGNOSIS — F17200 Nicotine dependence, unspecified, uncomplicated: Secondary | ICD-10-CM

## 2015-11-18 DIAGNOSIS — I10 Essential (primary) hypertension: Secondary | ICD-10-CM

## 2015-11-18 DIAGNOSIS — I5043 Acute on chronic combined systolic (congestive) and diastolic (congestive) heart failure: Secondary | ICD-10-CM

## 2015-11-18 DIAGNOSIS — E785 Hyperlipidemia, unspecified: Secondary | ICD-10-CM

## 2015-11-18 DIAGNOSIS — R079 Chest pain, unspecified: Secondary | ICD-10-CM

## 2015-11-18 LAB — GLUCOSE, CAPILLARY
GLUCOSE-CAPILLARY: 127 mg/dL — AB (ref 65–99)
Glucose-Capillary: 91 mg/dL (ref 65–99)
Glucose-Capillary: 91 mg/dL (ref 65–99)
Glucose-Capillary: 190 mg/dL — ABNORMAL HIGH (ref 65–99)
Glucose-Capillary: 89 mg/dL (ref 65–99)

## 2015-11-18 LAB — BASIC METABOLIC PANEL
Anion gap: 8 (ref 5–15)
BUN: 30 mg/dL — ABNORMAL HIGH (ref 6–20)
CO2: 27 mmol/L (ref 22–32)
Calcium: 8.5 mg/dL — ABNORMAL LOW (ref 8.9–10.3)
Chloride: 104 mmol/L (ref 101–111)
Creatinine, Ser: 1.15 mg/dL (ref 0.61–1.24)
GFR calc Af Amer: >60 mL/min (ref >60)
GFR calc non Af Amer: >60 mL/min (ref >60)
Glucose, Bld: 72 mg/dL (ref 65–99)
Potassium: 3.5 mmol/L (ref 3.5–5.1)
Sodium: 139 mmol/L (ref 135–145)

## 2015-11-18 LAB — HEMOGLOBIN A1C
Hgb A1c MFr Bld: 13.1 % — ABNORMAL HIGH (ref 4.8–5.6)
Mean Plasma Glucose: 329 mg/dL

## 2015-11-18 NOTE — Plan of Care (Signed)
Problem: Food- and Nutrition-Related Knowledge Deficit (NB-1.1) Goal: Nutrition education Formal process to instruct or train a patient/client in a skill or to impart knowledge to help patients/clients voluntarily manage or modify food choices and eating behavior to maintain or improve health. Outcome: Adequate for Discharge Nutrition Education Note  RD consulted for nutrition education regarding acute onset CHF. Patient says his feet "started swelling" about a month ago and then he noticed lately when he was in the garden it was more difficulty for him to breath which was new for him. He has not been weighing himself every day and says he doesn't have a scale.   RD provided "Low Sodium Nutrition Therapy" handout from the Academy of [employer]etics. Reviewed patient's dietary recall. Provided examples on ways to decrease sodium intake in diet. Discouraged intake of processed foods and use of salt shaker. Encouraged fresh fruits and vegetables as well as whole grain sources of carbohydrates to maximize fiber intake. Patient grows a garden and says he's usually actively working in it and playing with his 3 dogs. He lives alone. Breakfast for him is usually sausage, 3-4 eggs with cheese, wheat toast with jelly and coffee. He then has next meal around 2 pm.   RD discussed why it is important for patient to adhere to diet recommendations, and emphasized the role of fluids, foods to avoid, and importance of weighing self daily. Teach back method used.  Expect good compliance. Pt engaged during education and expect he will begin to limit portions of the high sodium foods he has been eating such as cheese and sausage.   Body mass index is 30.14 kg/m. Pt meets criteria for obese based on current BMI.  Current diet order is Heart Healthy/CHO modified, patient is consuming approximately 100% of meals at this time. Patient says we're "trying to starve him". Labs and medications reviewed. No further  nutrition interventions warranted at this time. RD contact information provided. If additional nutrition issues arise, please re-consult RD.   Colman Cater MS,RD,CSG,LDN Office: [phone] Pager: [phone]

## 2015-11-18 NOTE — Progress Notes (Signed)
Brad Mendoza  IWP:[id number] DOB: Dec 30, 1955 DOA: 11/16/2015 PCP: [name], MD    Brief Narrative: 60 yo male with diastolic CHF, HLD, tobacco abuse, admitted for acute on chronic diastolic CHF, EF 50% grade I diastolic dysfx, and chest pain.  He was seen by cardiology, recommended adherent to diet, and consider outpatient stress test.  He is being diuresed, as he is having significant volume overload, and bilateral edema.  His Cr is 1.1, K of 3.5.   No further CP.    Assessment & Plan:   Principal Problem:   Acute on chronic combined systolic and diastolic CHF (congestive heart failure) (HCC) Active Problems:   Hyperlipemia   Tobacco dependence   Essential hypertension   CHEST PAIN UNSPECIFIED   Type 2 diabetes mellitus with complications (HCC)   CHF exacerbation ([place])   1. Acute on chronic diastolic CHF:  Continue with IV diuresis.  Follow Cr and K carefully.  Dietary restriction reaffirmed. 2. HTN:   Controlled.  3. Chest pain:  Consider outpatient stress testing.   Troponins negative, EKG negative.  4. DM:  Controlled.  Continue with SSI.    DVT prophylaxis: Levonox Code Status: FULL CODE.  Family Communication: patient only.  Disposition Plan: likely 2-3 days.  Consultants:   Cardiology  Procedures:   None.   Antimicrobials: Anti-infectives    None       Subjective: No chest pain, SOB.  Still has edema bilaterally.   Objective: Vitals:   11/17/15 0451 11/17/15 1417 11/17/15 1900 11/18/15 0448  BP: 124/77 (!) 96/58 112/64 121/66  Pulse: 73 73 83 70  Resp: (!) 24 20 20 20   Temp: 98.6 F (37 C) 99 F (37.2 C) 98.7 F (37.1 C) 99 F (37.2 C)  TempSrc: Oral Oral Oral Oral  SpO2: 94% 98% 97% 98%  Weight:    92.6 kg (204 lb 1.6 oz)  Height:        Intake/Output Summary (Last 24 hours) at 11/18/15 0838 Last data filed at 11/17/15 1852  Gross per 24 hour  Intake              240 ml  Output              700 ml  Net              -460 ml   Filed Weights   11/16/15 1122 11/16/15 2111 11/18/15 0448  Weight: 94.3 kg (208 lb) 95.5 kg (210 lb 8.6 oz) 92.6 kg (204 lb 1.6 oz)    Examination:  General exam: Appears calm and comfortable  Respiratory system: Clear to auscultation. Respiratory effort normal. Cardiovascular system: S1 & S2 heard, RRR. No JVD, murmurs, rubs, gallops or clicks. No pedal edema. Gastrointestinal system: Abdomen is nondistended, soft and nontender. No organomegaly or masses felt. Normal bowel sounds heard. Central nervous system: Alert and oriented. No focal neurological deficits. Extremities: Symmetric 5 x 5 power. Skin: No rashes, lesions or ulcers Psychiatry: Judgement and insight appear normal. Mood & affect appropriate.   Data Reviewed: I have personally reviewed following labs and imaging studies  CBC:  Recent Labs Lab 11/16/15 1148  WBC 7.1  HGB 10.5*  HCT 32.5*  MCV 84.0  PLT 539   Basic Metabolic Panel:  Recent Labs Lab 11/16/15 1148 11/18/15 0520  NA 137 139  K 4.4 3.5  CL 106 104  CO2 24 27  GLUCOSE 204* 72  BUN 24* 30*  CREATININE  1.17 1.15  CALCIUM 8.8* 8.5*   GFR: Cardiac Enzymes:  Recent Labs Lab 11/16/15 1148 11/16/15 2054 11/17/15 0237 11/17/15 0836  TROPONINI <0.03 <0.03 <0.03 <0.03   BNP (last 3 results) CBG:  Recent Labs Lab 11/17/15 1150 11/17/15 1641 11/17/15 2059 11/18/15 0102 11/18/15 0752  GLUCAP 89 148* 66 91 91   Lipid Profile:  Recent Labs  11/16/15 2054  CHOL 130  HDL 45  LDLCALC 67  TRIG 91  CHOLHDL 2.9   Thyroid Function Tests:  Recent Labs  11/16/15 1148  TSH 1.892   Radiology Studies: Dg Chest 2 View  Result Date: 11/16/2015 CLINICAL DATA:  Chest pain and shortness of breath. EXAM: CHEST  2 VIEW COMPARISON:  11/04/2015, 10/17/2015 and 04/17/2015 and 11/25/2014 FINDINGS: Increasing cardiomegaly. Distention of the azygos vein. Pulmonary vascularity is normal. Increasing bilateral pleural effusions  and, peribronchial thickening and patchy infiltrates at the lung bases. Bones are normal. IMPRESSION: Increasing pleural effusions and patchy bilateral pulmonary infiltrates. This could represent atypical pulmonary edema. Electronically Signed   By: Lorriane Shire M.D.   On: 11/16/2015 11:56    Scheduled Meds: . aspirin EC  81 mg Oral Daily  . carvedilol  12.5 mg Oral BID WC  . enoxaparin (LOVENOX) injection  40 mg Subcutaneous Q24H  . fluticasone  2 spray Each Nare Daily  . furosemide  40 mg Intravenous BID  . gabapentin  300 mg Oral TID  . insulin aspart  0-15 Units Subcutaneous TID WC  . insulin detemir  35 Units Subcutaneous BID AC  . isosorbide mononitrate  90 mg Oral Daily  . lisinopril  40 mg Oral Daily  . nicotine  14 mg Transdermal Daily  . pantoprazole  40 mg Oral Daily  . potassium chloride SA  20 mEq Oral Daily  . rosuvastatin  10 mg Oral q1800   Continuous Infusions:    LOS: 1 day   Courtland Coppa, MD FACP Hospitalist.   If 7PM-7AM, please contact night-coverage [email or web address] Password [facility] 11/18/2015, 8:38 AM

## 2015-11-18 NOTE — Consult Note (Signed)
   [facility] CM Inpatient Consult   11/18/2015  Brad Mendoza Aug 25, 1955 [id number]   Patient evaluated for community based chronic disease management services with [facility] as a benefit of patient's Medicare. Spoke with patient at bedside to explain [facility] services.  Consent obtained, Patient will receive post hospital discharge call and will be evaluated for monthly home visits for assessments and disease process education. Patient states he is looking for a new provider, will check within the [facility] network for new provider. Left contact information and [facility] literature at bedside.  Made Inpatient Case Manager aware that [facility] following. Of note, [facility] Care Management services does not replace or interfere with any services that are arranged by inpatient case management or social work.   For additional questions or referrals please contact: Royetta Crochet. Laymond Purser, RN, BSN, [facility] Liaison [phone]

## 2015-11-18 NOTE — Care Management Important Message (Signed)
Important Message  Patient Details  Name: Brad Mendoza MRN: [id number] Date of Birth: Jul 04, 1955   Medicare Important Message Given:  Yes    Sherald Barge, RN 11/18/2015, 9:42 AM

## 2015-11-18 NOTE — Progress Notes (Addendum)
Subjective:  Complains of wheezing, some chest burning with activity.  Objective:  Vital Signs in the last 24 hours: Temp:  [98.7 F (37.1 C)-99 F (37.2 C)] 99 F (37.2 C) (09/27 0448) Pulse Rate:  [70-83] 70 (09/27 0448) Resp:  [20] 20 (09/27 0448) BP: (96-121)/(58-66) 121/66 (09/27 0448) SpO2:  [97 %-98 %] 98 % (09/27 0448) Weight:  [204 lb 1.6 oz (92.6 kg)] 204 lb 1.6 oz (92.6 kg) (09/27 0448)  Intake/Output from previous day: 09/26 0701 - 09/27 0700 In: 240 [P.O.:240] Out: 900 [Urine:900] Intake/Output from this shift: No intake/output data recorded.  Physical Exam: NECK: Without JVD, HJR, or bruit LUNGS: Decreased breath sounds throughout.Clear anterior, posterior, lateral HEART: Regular rate and rhythm, no murmur, gallop, rub, bruit, thrill, or heave EXTREMITIES: plus 2 edema.Without cyanosis, clubbing    Lab Results:  Recent Labs  11/16/15 1148  WBC 7.1  HGB 10.5*  PLT 221    Recent Labs  11/16/15 1148 11/18/15 0520  NA 137 139  K 4.4 3.5  CL 106 104  CO2 24 27  GLUCOSE 204* 72  BUN 24* 30*  CREATININE 1.17 1.15    Recent Labs  11/17/15 0237 11/17/15 0836  TROPONINI <0.03 <0.03   Hepatic Function Panel No results for input(s): PROT, ALBUMIN, AST, ALT, ALKPHOS, BILITOT, BILIDIR, IBILI in the last 72 hours.  Recent Labs  11/16/15 2054  CHOL 130   No results for input(s): PROTIME in the last 72 hours.    Cardiac Studies: 2Decho 06/2014: Study Conclusions   - Left ventricle: The cavity size was normal. Wall thickness was   normal. Systolic function was mildly to moderately reduced. The   estimated ejection fraction was in the range of 40% to 45%.   Diffuse hypokinesis. Doppler parameters are consistent with   abnormal left ventricular relaxation (grade 1 diastolic   dysfunction). - Aortic valve: Mildly calcified annulus. Trileaflet; mildly   thickened leaflets. Valve area (VTI): 3.03 cm^2. Valve area   (Vmax): 2.83 cm^2. Valve area  (Vmean): 3.05 cm^2. - Mitral valve: Mildly calcified annulus. Mildly thickened leaflets   . - Left atrium: The atrium was mildly dilated. - Atrial septum: No defect or patent foramen ovale was identified. - Pulmonary arteries: Systolic pressure was moderately increased.   PA peak pressure: 47 mm Hg (S). - Technically adequate study  Assessment/Plan:   1. Acute on chronic systolic heart failure: Appears to have been due to dietary indiscretion.  Troponins normal, ECG with nonspecific T wave abnormalities which aren't new.  Lasix to IV 40 mg BID yesterday -660 cc yesterday. Weight down to 204 lbs 6 pounds from yesterday 4 pounds from admission May consider an outpatient stress test. BP is controlled. Continue Coreg, nitrates, and lisinopril.    2. CAD (50% prior LAD lesion): Symptomatically stable for the most part on Imdur 90 mg daily. Troponins normal. Previous stress test showed only anterior and anteroseptal wall infarct without ischemia. Continue ASA, Coreg, nitrates, amlodipine, and Crestor. may consider an outpatient stress test.   3. Essential HTN: Controlled. No changes.   4. Hyperlipidemia: Continue Crestor. LDL down to 67, had been 166 10/2014.      Ermalinda Barrios PA-C  11/18/15 8:01 AM     LOS: 1 day    Pt seen and examined, agree with above. I started IV Lasix yesterday. Continue for next 2-3 days. Frequency/dosage can be increased as necessary, but symptoms have already begun to improve from yesterday afternoon.  Kate Sable, MD, [facility]  10:30 AM 11/18/2015

## 2015-11-18 NOTE — Progress Notes (Signed)
Pt takes 35 units of levemir at breakfast and dinner.  Pt given 35 units of levemir at dinner last night along with meal coverage. Pt's CBG dropped to 66.  CBG at 1700 today was 127.  MD called about pt being given meal coverage and 35 units levemir tonight. MD gave verbal order to give only 30 units of levemir tonight. RN will continue to monitor pt. Oswald Hillock, RN

## 2015-11-19 ENCOUNTER — Inpatient Hospital Stay (HOSPITAL_COMMUNITY): Payer: Medicare Other

## 2015-11-19 DIAGNOSIS — I509 Heart failure, unspecified: Secondary | ICD-10-CM

## 2015-11-19 DIAGNOSIS — I5033 Acute on chronic diastolic (congestive) heart failure: Secondary | ICD-10-CM

## 2015-11-19 LAB — ECHOCARDIOGRAM COMPLETE
AVLVOTPG: 4 mmHg
CHL CUP DOP CALC LVOT VTI: 24.5 cm
CHL CUP MV DEC (S): 176
CHL CUP TV REG PEAK VELOCITY: 351 cm/s
E decel time: 176 msec
E/e' ratio: 22.88
FS: 22 % — AB (ref 28–44)
HEIGHTINCHES: 69 in
IVS/LV PW RATIO, ED: 0.75
LA ID, A-P, ES: 45 mm
LA diam end sys: 45 mm
LA diam index: 2.1 cm/m2
LA vol A4C: 106 ml
LA vol index: 52.7 mL/m2
LA vol: 113 mL
LDCA: 3.8 cm2
LV E/e'average: 22.88
LV TDI E'LATERAL: 5.55
LV dias vol index: 51 mL/m2
LV sys vol: 66 mL — AB (ref 21–61)
LVDIAVOL: 109 mL (ref 62–150)
LVEEMED: 22.88
LVELAT: 5.55 cm/s
LVOT SV: 93 mL
LVOT peak vel: 105 cm/s
LVOTD: 22 mm
LVSYSVOLIN: 31 mL/m2
MV pk E vel: 127 m/s
MVPG: 6 mmHg
MVPKAVEL: 74.7 m/s
PW: 11 mm — AB (ref 0.6–1.1)
RV LATERAL S' VELOCITY: 18.9 cm/s
RV TAPSE: 25.4 mm
RV sys press: 52 mmHg
Simpson's disk: 40
Stroke v: 43 ml
TDI e' medial: 6.74
TR max vel: 351 cm/s
Weight: 3256.11 oz

## 2015-11-19 LAB — URINALYSIS W MICROSCOPIC (NOT AT ARMC)
Bilirubin Urine: NEGATIVE
GLUCOSE, UA: 250 mg/dL — AB
KETONES UR: NEGATIVE mg/dL
Leukocytes, UA: NEGATIVE
NITRITE: NEGATIVE
PROTEIN: 100 mg/dL — AB
Specific Gravity, Urine: >1.03 — ABNORMAL HIGH (ref 1.005–1.030)
pH: 5.5 (ref 5.0–8.0)

## 2015-11-19 LAB — GLUCOSE, CAPILLARY
GLUCOSE-CAPILLARY: 62 mg/dL — AB (ref 65–99)
GLUCOSE-CAPILLARY: 222 mg/dL — AB (ref 65–99)
GLUCOSE-CAPILLARY: 132 mg/dL — AB (ref 65–99)
Glucose-Capillary: 96 mg/dL (ref 65–99)
Glucose-Capillary: 108 mg/dL — ABNORMAL HIGH (ref 65–99)

## 2015-11-19 LAB — BASIC METABOLIC PANEL
ANION GAP: 5 (ref 5–15)
BUN: 27 mg/dL — ABNORMAL HIGH (ref 6–20)
CHLORIDE: 105 mmol/L (ref 101–111)
CO2: 26 mmol/L (ref 22–32)
Calcium: 7.9 mg/dL — ABNORMAL LOW (ref 8.9–10.3)
Creatinine, Ser: 1.13 mg/dL (ref 0.61–1.24)
Glucose, Bld: 145 mg/dL — ABNORMAL HIGH (ref 65–99)
POTASSIUM: 4.1 mmol/L (ref 3.5–5.1)
SODIUM: 136 mmol/L (ref 135–145)

## 2015-11-19 MED ORDER — POTASSIUM CHLORIDE CRYS ER 20 MEQ PO TBCR
20.0000 meq | EXTENDED_RELEASE_TABLET | Freq: Two times a day (BID) | ORAL | Status: DC
Start: 2015-11-19 — End: 2015-11-20
  Administered 2015-11-19 – 2015-11-20 (×3): 20 meq via ORAL
  Filled 2015-11-19 (×3): qty 1

## 2015-11-19 MED ORDER — INSULIN DETEMIR 100 UNIT/ML ~~LOC~~ SOLN
20.0000 [IU] | Freq: Two times a day (BID) | SUBCUTANEOUS | Status: DC
Start: 2015-11-19 — End: 2015-11-20
  Administered 2015-11-19 – 2015-11-20 (×2): 20 [IU] via SUBCUTANEOUS
  Filled 2015-11-19 (×8): qty 0.2

## 2015-11-19 MED ORDER — GLUCOSE 40 % PO GEL
ORAL | Status: AC
  Administered 2015-11-19: 08:00:00
  Filled 2015-11-19: qty 1

## 2015-11-19 NOTE — Progress Notes (Signed)
Inpatient Diabetes Program Recommendations  AACE/ADA: New Consensus Statement on Inpatient Glycemic Control (2015)  Target Ranges:  Prepandial:   less than 140 mg/dL      Peak postprandial:   less than 180 mg/dL (1-2 hours)      Critically ill patients:  140 - 180 mg/dL   Lab Results  Component Value Date   GLUCAP 108 (H) 11/19/2015   HGBA1C 13.1 (H) 11/16/2015    Review of Glycemic Control  Diabetes history: DM2 Outpatient Diabetes medications: Levemir 35 units bid+ patient states not currently taking Humalog 0-15 units tid ac meals Current orders for Inpatient glycemic control: Levemir 20 units bid + Novolog correction 0-15 tid  Inpatient Diabetes Program Recommendations:   Spoke with pt by phone about A1C results and explained what an A1C is, basic pathophysiology of DM Type 2, basic home care, basic diabetes diet nutrition principles, importance of checking CBGs and maintaining good CBG control to prevent long-term and short-term complications. Reviewed signs and symptoms of hyperglycemia and hypoglycemia and how to treat hypoglycemia at home. Also reviewed blood sugar goals at home.  Patient states he is doing better with CBGs and is actually coming down. Reviewed past A1c and slightly improved.  Thank you, Brad Mendoza. Brad Heitman, RN, MSN, CDE Inpatient Glycemic Control Team Team Pager [phone] (8am-5pm) 11/19/2015 2:23 PM

## 2015-11-19 NOTE — Progress Notes (Signed)
*  PRELIMINARY RESULTS* Echocardiogram 2D Echocardiogram has been performed.  Brad Mendoza 11/19/2015, 12:59 PM

## 2015-11-19 NOTE — Progress Notes (Signed)
PROGRESS NOTE    Brad Mendoza  KNL:[id number] DOB: 1956-01-11 DOA: 11/16/2015 PCP: [name], MD    Brief Narrative: 59 yo male with diastolic CHF, HLD, tobacco abuse, admitted for acute on chronic diastolic CHF, EF 79% grade I diastolic dysfx, and chest pain.  He was seen by cardiology, recommended adherent to diet, and consider outpatient stress test.  He is being diuresed, as he is having significant volume overload, and bilateral edema.  His Cr was 1.1, K of 3.5.   No further CP.    Assessment & Plan:   Principal Problem:   Acute on chronic combined systolic and diastolic CHF (congestive heart failure) (HCC) Active Problems:   Hyperlipemia   Tobacco dependence   Essential hypertension   CHEST PAIN UNSPECIFIED   Type 2 diabetes mellitus with complications (HCC)   CHF exacerbation ([place])   1. Acute on chronic diastolic CHF:  Continue with IV diuresis.  Follow Cr and K carefully.  Dietary restriction reaffirmed. 2. HTN:   Controlled.  3. Chest pain:  Consider outpatient stress testing.   Troponins negative, EKG negative.  4. DM:  Controlled.  Continue with SSI.        5.      Low back pain:  Lumbago.  Will check UA.   DVT prophylaxis: Levonox Code Status: FULL CODE.  Family Communication: patient only.  Disposition Plan: likely 2-3 days.  Consultants:   Cardiology  Procedures:   None.   Antimicrobials:     Anti-infectives    None         Subjective:  Pain on both legs are better.  Leg swelling is better.  C/o low back mildly.   Objective: Vitals:   11/18/15 1326 11/18/15 2059 11/18/15 2110 11/19/15 0619  BP: (!) 105/49  108/62 121/64  Pulse: 73  68 73  Resp: 20  20 20   Temp: 98.7 F (37.1 C)  97.9 F (36.6 C) 98.8 F (37.1 C)  TempSrc: Oral  Oral Oral  SpO2: 98% 94% 95% 99%  Weight:    92.3 kg (203 lb 8.1 oz)  Height:        Intake/Output Summary (Last 24 hours) at 11/19/15 0900 Last data filed at 11/19/15 0543  Gross  per 24 hour  Intake              900 ml  Output             2450 ml  Net            -1550 ml   Filed Weights   11/16/15 2111 11/18/15 0448 11/19/15 0619  Weight: 95.5 kg (210 lb 8.6 oz) 92.6 kg (204 lb 1.6 oz) 92.3 kg (203 lb 8.1 oz)    Examination:  General exam: Appears calm and comfortable  Respiratory system: Clear to auscultation. Respiratory effort normal. Cardiovascular system: S1 & S2 heard, RRR. No JVD, murmurs, rubs, gallops or clicks. No pedal edema. Gastrointestinal system: Abdomen is nondistended, soft and nontender. No organomegaly or masses felt. Normal bowel sounds heard. Central nervous system: Alert and oriented. No focal neurological deficits. Extremities: Symmetric 5 x 5 power. Skin: No rashes, lesions or ulcers Psychiatry: Judgement and insight appear normal. Mood & affect appropriate.   Data Reviewed: I have personally reviewed following labs and imaging studies  CBC:  Recent Labs Lab 11/16/15 1148  WBC 7.1  HGB 10.5*  HCT 32.5*  MCV 84.0  PLT 024   Basic Metabolic Panel:  Recent Labs  Lab 11/16/15 1148 11/18/15 0520  NA 137 139  K 4.4 3.5  CL 106 104  CO2 24 27  GLUCOSE 204* 72  BUN 24* 30*  CREATININE 1.17 1.15  CALCIUM 8.8* 8.5*   GFR: Cardiac Enzymes:  Recent Labs Lab 11/16/15 1148 11/16/15 2054 11/17/15 0237 11/17/15 0836  TROPONINI <0.03 <0.03 <0.03 <0.03   BNP (last 3 results) No results for input(s): PROBNP in the last 8760 hours. HbA1C:  Recent Labs  11/16/15 1148  HGBA1C 13.1*   CBG:  Recent Labs Lab 11/18/15 1138 11/18/15 1646 11/18/15 2050 11/19/15 0734 11/19/15 0821  GLUCAP 190* 127* 89 62* 96   Lipid Profile:  Recent Labs  11/16/15 2054  CHOL 130  HDL 45  LDLCALC 67  TRIG 91  CHOLHDL 2.9   Thyroid Function Tests:  Recent Labs  11/16/15 1148  TSH 1.892   Radiology Studies: No results found.  Scheduled Meds: . aspirin EC  81 mg Oral Daily  . carvedilol  12.5 mg Oral BID WC  .  enoxaparin (LOVENOX) injection  40 mg Subcutaneous Q24H  . fluticasone  2 spray Each Nare Daily  . furosemide  40 mg Intravenous BID  . gabapentin  300 mg Oral TID  . insulin aspart  0-15 Units Subcutaneous TID WC  . insulin detemir  35 Units Subcutaneous BID AC  . isosorbide mononitrate  90 mg Oral Daily  . lisinopril  40 mg Oral Daily  . nicotine  14 mg Transdermal Daily  . pantoprazole  40 mg Oral Daily  . potassium chloride SA  20 mEq Oral BID  . rosuvastatin  10 mg Oral q1800   Continuous Infusions:    LOS: 2 days   Sunil Hue, MD FACP Hospitalist.   If 7PM-7AM, please contact night-coverage [email or web address] Password [email or web address] 11/19/2015, 9:00 AM

## 2015-11-19 NOTE — Progress Notes (Addendum)
Primary Cardiologist: Dr. Kate Sable  Cardiology Specific Problem List:  1. Acute on Chronic Systolic CHF 2. NICM  Subjective:    Feeling better but continues to have significant LEE.   Objective:   Temp:  [97.9 F (36.6 C)-98.8 F (37.1 C)] 98.8 F (37.1 C) (09/28 0619) Pulse Rate:  [68-73] 73 (09/28 0619) Resp:  [20] 20 (09/28 0619) BP: (105-121)/(49-64) 121/64 (09/28 0619) SpO2:  [94 %-99 %] 99 % (09/28 0619) Weight:  [203 lb 8.1 oz (92.3 kg)] 203 lb 8.1 oz (92.3 kg) (09/28 0619) Last BM Date: 11/17/15  Filed Weights   11/16/15 2111 11/18/15 0448 11/19/15 2130  Weight: 210 lb 8.6 oz (95.5 kg) 204 lb 1.6 oz (92.6 kg) 203 lb 8.1 oz (92.3 kg)    Intake/Output Summary (Last 24 hours) at 11/19/15 0941 Last data filed at 11/19/15 0543  Gross per 24 hour  Intake              420 ml  Output             2450 ml  Net            -2030 ml    Telemetry: NSR with occasional PVC and PAC. Rates in the 80's.   Exam:  General: No acute distress.  Lungs: Clear to auscultation, nonlabored.  Cardiac: No elevated JVP or bruits. RRR, 1/6 systolic murmur, no gallop or rub.   Abdomen: Normoactive bowel sounds, nontender, nondistended.  Extremities: 2-3+ pretibial pitting edema, distal pulses full.  Lab Results:  Basic Metabolic Panel:  Recent Labs Lab 11/16/15 1148 11/18/15 0520  NA 137 139  K 4.4 3.5  CL 106 104  CO2 24 27  GLUCOSE 204* 72  BUN 24* 30*  CREATININE 1.17 1.15  CALCIUM 8.8* 8.5*    CBC:  Recent Labs Lab 11/16/15 1148  WBC 7.1  HGB 10.5*  HCT 32.5*  MCV 84.0  PLT 221    Cardiac Enzymes:  Recent Labs Lab 11/16/15 2054 11/17/15 0237 11/17/15 0836  TROPONINI <0.03 <0.03 <0.03   Echocardiogram 06/2014 Left ventricle: The cavity size was normal. Wall thickness was   normal. Systolic function was mildly to moderately reduced. The   estimated ejection fraction was in the range of 40% to 45%.   Diffuse hypokinesis. Doppler  parameters are consistent with   abnormal left ventricular relaxation (grade 1 diastolic   dysfunction). - Aortic valve: Mildly calcified annulus. Trileaflet; mildly   thickened leaflets. Valve area (VTI): 3.03 cm^2. Valve area   (Vmax): 2.83 cm^2. Valve area (Vmean): 3.05 cm^2. - Mitral valve: Mildly calcified annulus. Mildly thickened leaflets   . - Left atrium: The atrium was mildly dilated. - Atrial septum: No defect or patent foramen ovale was identified. - Pulmonary arteries: Systolic pressure was moderately increased.   PA peak pressure: 47 mm Hg (S). - Technically adequate study.   Medications:   Scheduled Medications: . aspirin EC  81 mg Oral Daily  . carvedilol  12.5 mg Oral BID WC  . enoxaparin (LOVENOX) injection  40 mg Subcutaneous Q24H  . fluticasone  2 spray Each Nare Daily  . furosemide  40 mg Intravenous BID  . gabapentin  300 mg Oral TID  . insulin aspart  0-15 Units Subcutaneous TID WC  . insulin detemir  20 Units Subcutaneous BID AC  . isosorbide mononitrate  90 mg Oral Daily  . lisinopril  40 mg Oral Daily  . nicotine  14 mg Transdermal Daily  .  pantoprazole  40 mg Oral Daily  . potassium chloride SA  20 mEq Oral BID  . rosuvastatin  10 mg Oral q1800      PRN Medications: acetaminophen, albuterol, gi cocktail, morphine injection, nitroGLYCERIN, ondansetron (ZOFRAN) IV   Assessment and Plan:   1.Acute on Chronic Systolic CHF: Will repeat echo as he continues to have significant edema. Output overnight 2.400 cc with total output since admission 2.210 cc. Will place on 1500 cc fluid restriction. Continue IV furosemide 40 mg BID.   May need to increase to 60 mg IV BID, Wt down 7 lbs from highest recorded. Potassium 3.5. Will increase to 20 mEq BID. Repeat BMET He is advised to keep legs elevated when not walking in the room.   2. CAD:  Hx of LAD stenosis, 50%. Negative troponin. Continue ACE, Coreg, ASA and statin  3. Hypertension; On above medications  along with nitrates. May be contributing to his dependent LEE along with CHF. BP is stable. Will await echo results to help to direct therapy and adjust medications if warranted.   4. Pulmonary Hypertension: Noted elevated PA pressure on last echo. Will re-evaluate.   Phill Myron. Lawrence NP [place]  11/19/2015, 9:41 AM    Attending note:  Patient seen and examined. Reviewed recent hospital course. Agree with above assessment by Ms. Lawrence NP. Patient diuresed additional 1500 cc out last 24 hours on IV Lasix. Still has leg edema that is slowly improving. He is starting to feel better clinically. His last echocardiogram was in May 2016, agree with repeat study to reassess cardiac structure and function as well as degree of pulmonary hypertension. On examination he appears comfortable today, systolic blood pressure in the 120s, and heart rate in the 70s. Lungs are clear. He has 2-3+ lower leg edema. Creatinine 1.2, potassium 3.5.  Satira Sark, M.D., F.A.C.C.

## 2015-11-20 LAB — BASIC METABOLIC PANEL
ANION GAP: 6 (ref 5–15)
BUN: 27 mg/dL — ABNORMAL HIGH (ref 6–20)
CHLORIDE: 102 mmol/L (ref 101–111)
CO2: 26 mmol/L (ref 22–32)
Calcium: 7.9 mg/dL — ABNORMAL LOW (ref 8.9–10.3)
Creatinine, Ser: 1.04 mg/dL (ref 0.61–1.24)
GFR calc Af Amer: >60 mL/min (ref >60)
Glucose, Bld: 120 mg/dL — ABNORMAL HIGH (ref 65–99)
POTASSIUM: 4.3 mmol/L (ref 3.5–5.1)
SODIUM: 134 mmol/L — AB (ref 135–145)

## 2015-11-20 LAB — GLUCOSE, CAPILLARY
GLUCOSE-CAPILLARY: 116 mg/dL — AB (ref 65–99)
GLUCOSE-CAPILLARY: 255 mg/dL — AB (ref 65–99)

## 2015-11-20 MED ORDER — FUROSEMIDE 40 MG PO TABS: 40.0000 mg | ORAL_TABLET | Freq: Two times a day (BID) | ORAL | 1 refills | Status: DC

## 2015-11-20 MED ORDER — SPIRONOLACTONE 25 MG PO TABS
12.5000 mg | ORAL_TABLET | Freq: Every day | ORAL | Status: DC
  Administered 2015-11-20: 12.5 mg via ORAL
  Filled 2015-11-20: qty 1

## 2015-11-20 MED ORDER — NICOTINE 14 MG/24HR TD PT24: 14.0000 mg | MEDICATED_PATCH | Freq: Every day | TRANSDERMAL | 0 refills | Status: DC

## 2015-11-20 MED ORDER — SPIRONOLACTONE 25 MG PO TABS: 12.5000 mg | ORAL_TABLET | Freq: Every day | ORAL | 1 refills | Status: DC

## 2015-11-20 NOTE — Discharge Summary (Signed)
Physician Discharge Summary  Brad Mendoza TOI:[id number] DOB: [birth year] DOA: 11/16/2015  PCP: [name], MD  Admit date: 11/16/2015 Discharge date: 11/20/2015  Admitted From: Home. Disposition:  Home.  Recommendations for Outpatient Follow-up:  1. Follow up with PCP in 1-2 weeks  2.  Follow up with cardiology in one week.  Home Health: None.   Equipment/Devices: None.  Discharge Condition: Improved.  CODE STATUS: Full Code.  Diet recommendation: Cardiac diet.   Brief/Interim Summary:  Patient was admitted by Dr Lorin Mercy on Sept 25, 2017 for atypical chest pain and CHF.  As per her H and P:  " Brad Mendoza is a 60 y.o. male with medical history significant of DM, HTN, HLD, and CHF presenting with chest pain/SOB.  Last night after eating it was hard for him to breathe, couldn't lay down.  Ate cheese taco stuff from the store and some potato chips and then a ground [place] burger with egg, peppers, and onions.  Also felt tired.  +Jaxon Flatt edema.  Today, felt like something was on his chest, needle-like sensations in chest and abdomen.  He also had pain and took NTG with some improvement Saturday night.  Also feels like something is under B shoulder blades and running down his side to his left kidney.  Also with neck pain.  +headaches.  Cath in 2005, no recent stress test but last was nuclear "a while back."  ED Course: Given ASA and Lasix and hospitalists called for admission for chest pain  Hospital course:   60 yo male with diastolic CHF, HLD, tobacco abuse, admitted for acute on chronic diastolic CHF, EF 83% grade I diastolic dysfx, and chest pain. He was seen by cardiology, recommended adherent to diet, and consider outpatient stress test. He was  diuresed, as he is having significant volume overload, and bilateral edema. He was given several days of IV lasix, at the same time, following his K and his Cr.  They were both stable.  His leg swelling has improved, and he was able to  ambulate without SOB or CP.  He is now stable for discharge.  Dr Domenic Polite had added Aldactone to his regimen, and recommneded that he has increased his Lasix to 40mg  BID at home.  He will follow up with his PCP next week, and cardiology follow up next week as well.  Thank you for allowing me to participate in his care.  Good Day.  Discharge Diagnoses:  Principal Problem:   Acute on chronic combined systolic and diastolic CHF (congestive heart failure) (HCC) Active Problems:   Hyperlipemia   Tobacco dependence   Essential hypertension   CHEST PAIN UNSPECIFIED   Type 2 diabetes mellitus with complications (HCC)   CHF exacerbation [facility])    Discharge Instructions  Discharge Instructions    AMB Referral to [facility]    Complete by:  As directed    Please refer to [facility]  for Transition of care program. Also refer to [facility] LCSW for community resources.  Consent obtained at bedside.  Please call for questions: Royetta Crochet. Laymond Purser, RN, BSN, CCM  [facility] [phone]   Reason for consult:  [facility] consult/[facility] active   Diagnoses of:  Heart Failure   Expected date of contact:  1-3 days (reserved for hospital discharges)       Medication List    TAKE these medications   albuterol 108 (90 Base) MCG/ACT inhaler Commonly known as:  PROVENTIL HFA;VENTOLIN HFA Inhale 2 puffs  into the lungs every 6 (six) hours as needed for wheezing or shortness of breath.   amLODipine 10 MG tablet Commonly known as:  NORVASC Take 1 tablet (10 mg total) by mouth daily.   aspirin EC 81 MG tablet Take 81 mg by mouth daily.   carvedilol 12.5 MG tablet Commonly known as:  COREG Take 1 tablet (12.5 mg total) by mouth 2 (two) times daily with a meal.   CENTRUM SILVER ADULT 50+ PO Take 1 tablet by mouth daily.   Fish Oil 1000 MG Cpdr Take 1 capsule by mouth daily.   fluticasone 50 MCG/ACT nasal spray Commonly known as:  FLONASE USE 2 SPRAYS IN EACH NOSTRIL DAILY    furosemide 40 MG tablet Commonly known as:  LASIX Take 1.5 tablets (60 mg total) by mouth daily. What changed:  Another medication with the same name was added. Make sure you understand how and when to take each.   furosemide 40 MG tablet Commonly known as:  LASIX Take 1 tablet (40 mg total) by mouth 2 (two) times daily. What changed:  You were already taking a medication with the same name, and this prescription was added. Make sure you understand how and when to take each.   gabapentin 300 MG capsule Commonly known as:  NEURONTIN Take 1 capsule (300 mg total) by mouth 3 (three) times daily.   ICY HOT ARTHRITIS THERAPY EX Apply 1 application topically 2 (two) times daily as needed (back pain).   Insulin Detemir 100 UNIT/ML Pen Commonly known as:  LEVEMIR FLEXTOUCH INJECT 35 UNITS SQ TWICE DAILY WITH BREAKFAST AND SUPPER   insulin lispro 100 UNIT/ML KiwkPen Commonly known as:  HUMALOG KWIKPEN INJECT UP TO 15 UNITS PRIOR TO EACH MEALAS NEEDED FOR ELAVATED BG   isosorbide mononitrate 60 MG 24 hr tablet Commonly known as:  IMDUR Take 1.5 tablets (90 mg total) by mouth daily.   lisinopril 40 MG tablet Commonly known as:  PRINIVIL,ZESTRIL Take 1 tablet (40 mg total) by mouth daily.   nicotine 14 mg/24hr patch Commonly known as:  NICODERM CQ - dosed in mg/24 hours Place 1 patch (14 mg total) onto the skin daily.   nitroGLYCERIN 0.4 MG SL tablet Commonly known as:  NITROSTAT Place 1 tablet (0.4 mg total) under the tongue every 5 (five) minutes as needed.   pantoprazole 40 MG tablet Commonly known as:  PROTONIX Take 1 tablet (40 mg total) by mouth daily.   potassium chloride SA 20 MEQ tablet Commonly known as:  K-DUR,KLOR-CON Take as needed when need to take furosemide What changed:  how much to take  how to take this  when to take this  additional instructions   rosuvastatin 10 MG tablet Commonly known as:  CRESTOR Take 1 tablet (10 mg total) by mouth daily.    spironolactone 25 MG tablet Commonly known as:  ALDACTONE Take 0.5 tablets (12.5 mg total) by mouth daily.      Follow-up Information    Jory Sims, NP Follow up on 12/04/2015.   Specialties:  Nurse Practitioner, Radiology, Cardiology Why:  1:10 pm Contact information: [place] [address] [phone]          Allergies  Allergen Reactions  . Sulfa Antibiotics Shortness Of Breath  . Penicillins Rash    Has patient had a PCN reaction causing immediate rash, facial/tongue/throat swelling, SOB or lightheadedness with hypotension: No Has patient had a PCN reaction causing severe rash involving mucus membranes or skin necrosis:  No Has patient had a PCN reaction that required hospitalization No Has patient had a PCN reaction occurring within the last 10 years: No If all of the above answers are "NO", then may proceed with Cephalosporin use.     Consultations:  Cardiology.    Procedures/Studies: Dg Chest 2 View  Result Date: 11/16/2015 CLINICAL DATA:  Chest pain and shortness of breath. EXAM: CHEST  2 VIEW COMPARISON:  11/04/2015, 10/17/2015 and 04/17/2015 and 11/25/2014 FINDINGS: Increasing cardiomegaly. Distention of the azygos vein. Pulmonary vascularity is normal. Increasing bilateral pleural effusions and, peribronchial thickening and patchy infiltrates at the lung bases. Bones are normal. IMPRESSION: Increasing pleural effusions and patchy bilateral pulmonary infiltrates. This could represent atypical pulmonary edema. Electronically Signed   By: Lorriane Shire M.D.   On: 11/16/2015 11:56   Dg Chest 2 View  Result Date: 11/04/2015 CLINICAL DATA:  Shortness of breath with left-sided chest pain EXAM: CHEST  2 VIEW COMPARISON:  10/17/2015 FINDINGS: Streaky bibasilar opacity. Small bilateral pleural effusion stable to minimally increased from prior. Small volume fluid within the right major fissure. No air bronchogram. No pneumothorax. Stable borderline  heart size.  Negative aortic and hilar contours. IMPRESSION: 1. Bibasilar atelectasis, superimposed bronchopneumonia not excluded in the appropriate clinical setting. 2. Small bilateral pleural effusion, greater on the right. Electronically Signed   By: Monte Fantasia M.D.   On: 11/04/2015 11:41       Subjective: Feeling well today.    Discharge Exam: Vitals:   11/19/15 2037 11/20/15 0427  BP: 120/68 135/61  Pulse: 79 77  Resp: 19 18  Temp: 98.5 F (36.9 C) 98.8 F (37.1 C)   Vitals:   11/19/15 1429 11/19/15 1933 11/19/15 2037 11/20/15 0427  BP: 115/64  120/68 135/61  Pulse: 72  79 77  Resp: 20  19 18   Temp: 98.9 F (37.2 C)  98.5 F (36.9 C) 98.8 F (37.1 C)  TempSrc: Oral  Oral Oral  SpO2: 100% 98% 100% 100%  Weight:    92.5 kg (203 lb 14.4 oz)  Height:        General: Pt is alert, awake, not in acute distress Cardiovascular: RRR, S1/S2 +, no rubs, no gallops Respiratory: CTA bilaterally, no wheezing, no rhonchi Abdominal: Soft, NT, ND, bowel sounds + Extremities: no edema, no cyanosis    The results of significant diagnostics from this hospitalization (including imaging, microbiology, ancillary and laboratory) are listed below for reference.     Labs: BNP (last 3 results)  Recent Labs  10/17/15 1014 11/04/15 1144 11/16/15 1150  BNP 581.0* 307.0* 706.2*   Basic Metabolic Panel:  Recent Labs Lab 11/16/15 1148 11/18/15 0520 11/19/15 0929 11/20/15 0554  NA 137 139 136 134*  K 4.4 3.5 4.1 4.3  CL 106 104 105 102  CO2 24 27 26 26   GLUCOSE 204* 72 145* 120*  BUN 24* 30* 27* 27*  CREATININE 1.17 1.15 1.13 1.04  CALCIUM 8.8* 8.5* 7.9* 7.9*   Liver Function Tests: CBC:  Recent Labs Lab 11/16/15 1148  WBC 7.1  HGB 10.5*  HCT 32.5*  MCV 84.0  PLT 221   Cardiac Enzymes:  Recent Labs Lab 11/16/15 1148 11/16/15 2054 11/17/15 0237 11/17/15 0836  TROPONINI <0.03 <0.03 <0.03 <0.03   BNP: Invalid input(s): POCBNP CBG:  Recent  Labs Lab 11/19/15 0821 11/19/15 1127 11/19/15 1647 11/19/15 2131 11/20/15 0728  GLUCAP 96 108* 222* 132* 116*   Urinalysis    Component Value Date/Time   COLORURINE YELLOW 11/19/2015  [facility] 11/19/2015 1620   LABSPEC >1.030 (H) 11/19/2015 1620   PHURINE 5.5 11/19/2015 1620   GLUCOSEU 250 (A) 11/19/2015 1620   HGBUR TRACE (A) 11/19/2015 1620   BILIRUBINUR NEGATIVE 11/19/2015 1620   BILIRUBINUR neg 11/07/2014 1154   KETONESUR NEGATIVE 11/19/2015 1620   PROTEINUR 100 (A) 11/19/2015 1620   UROBILINOGEN negative 11/07/2014 1154   UROBILINOGEN 0.2 05/07/2013 0851   NITRITE NEGATIVE 11/19/2015 1620   LEUKOCYTESUR NEGATIVE 11/19/2015 1620    Time coordinating discharge: Over 30 minutes  SIGNED:  Orvan Falconer, MD FACP [facility] 11/20/2015, 10:07 AM   If 7PM-7AM, please contact night-coverage [email or web address] Password [email or web address]

## 2015-11-20 NOTE — Progress Notes (Signed)
Primary cardiologist: Dr. Kate Sable  Seen for followup: Cardiomyopathy and combined heart failure  Subjective:    More comfortable, breathing more easily. Still has leg edema which is slowly improving. No chest pain or palpitations.  Objective:   Temp:  [98.5 F (36.9 C)-98.9 F (37.2 C)] 98.8 F (37.1 C) (09/29 0427) Pulse Rate:  [72-79] 77 (09/29 0427) Resp:  [18-20] 18 (09/29 0427) BP: (115-135)/(61-68) 135/61 (09/29 0427) SpO2:  [98 %-100 %] 100 % (09/29 0427) Weight:  [203 lb 14.4 oz (92.5 kg)] 203 lb 14.4 oz (92.5 kg) (09/29 0427) Last BM Date: 11/17/15  Filed Weights   11/18/15 0448 11/19/15 0619 11/20/15 0427  Weight: 204 lb 1.6 oz (92.6 kg) 203 lb 8.1 oz (92.3 kg) 203 lb 14.4 oz (92.5 kg)    Intake/Output Summary (Last 24 hours) at 11/20/15 0852 Last data filed at 11/20/15 0100  Gross per 24 hour  Intake                0 ml  Output             2000 ml  Net            -2000 ml    Exam:  General: Comfortable at rest.  Lungs: No crackles, breathing comfortably.  Cardiac: Indistinct PMI, no gallop, soft apical systolic murmur.  Abdomen: NABS.  Extremities: 2+ lower leg edema.  Lab Results:  Basic Metabolic Panel:  Recent Labs Lab 11/18/15 0520 11/19/15 0929 11/20/15 0554  NA 139 136 134*  K 3.5 4.1 4.3  CL 104 105 102  CO2 27 26 26   GLUCOSE 72 145* 120*  BUN 30* 27* 27*  CREATININE 1.15 1.13 1.04  CALCIUM 8.5* 7.9* 7.9*    CBC:  Recent Labs Lab 11/16/15 1148  WBC 7.1  HGB 10.5*  HCT 32.5*  MCV 84.0  PLT 221    Cardiac Enzymes:  Recent Labs Lab 11/16/15 2054 11/17/15 0237 11/17/15 0836  TROPONINI <0.03 <0.03 <0.03    Echocardiogram 11/19/2015: Study Conclusions  - Left ventricle: The cavity size was mildly dilated. Wall   thickness was increased in a pattern of mild LVH. The estimated   ejection fraction was 40%. Diffuse hypokinesis. Features are   consistent with a pseudonormal left ventricular filling  pattern,   with concomitant abnormal relaxation and increased filling   pressure (grade 2 diastolic dysfunction). - Ventricular septum: The contour showed diastolic flattening and   systolic flattening. - Mitral valve: Calcified annulus. Mildly thickened leaflets .   There was trivial regurgitation. - Left atrium: The atrium was severely dilated. - Right atrium: The atrium was moderately dilated. Central venous   pressure (est): 15 mm Hg. - Tricuspid valve: There was moderate regurgitation. - Pulmonary arteries: Systolic pressure was moderately to severely   increased. PA peak pressure: 64 mm Hg (S). - Pericardium, extracardiac: There was no pericardial effusion.  Impressions:  - Mildly dilated LV with mild LVH and LVEF approximately 40%.   Overall diffuse hypokinesis with LV septal flattening consistent   with RV pressure and volume overload. Grade 2 diastolic   dysfunction with increased LV filling pressure. Severe left   atrial enlargement and moderate right atrial enlargement. MAC   with mildly thickened mitral leaflets and trivial mitral   regurgitation. Moderate tricuspid regurgitation with PASP   estimated 64 mmHg.   Medications:   Scheduled Medications: . aspirin EC  81 mg Oral Daily  . carvedilol  12.5 mg Oral BID  WC  . enoxaparin (LOVENOX) injection  40 mg Subcutaneous Q24H  . fluticasone  2 spray Each Nare Daily  . furosemide  40 mg Intravenous BID  . gabapentin  300 mg Oral TID  . insulin aspart  0-15 Units Subcutaneous TID WC  . insulin detemir  20 Units Subcutaneous BID AC  . isosorbide mononitrate  90 mg Oral Daily  . lisinopril  40 mg Oral Daily  . nicotine  14 mg Transdermal Daily  . pantoprazole  40 mg Oral Daily  . potassium chloride SA  20 mEq Oral BID  . rosuvastatin  10 mg Oral q1800     PRN Medications:  acetaminophen, albuterol, gi cocktail, morphine injection, nitroGLYCERIN, ondansetron (ZOFRAN) IV   Assessment:   1. Nonischemic  cardiomyopathy with acute on chronic combined heart failure and volume overload. Also complicated by severe pulmonary hypertension with RV strain. He is diuresing well on current IV Lasix. Additional medications include Coreg, lisinopril, and Imdur.  2. Nonobstructive CAD history, no active angina symptoms.  3. Essential hypertension. He was taken off Norvasc, blood pressure trending in reasonable control on present regimen.   Plan/Discussion:    Would continue IV Lasix 40 mg twice daily with good diuresis and stable renal function. Start Aldactone 12.5 mg daily. Continue Coreg, lisinopril, and Imdur. Follow-up BME T a.m. Hopefully nearing discharge in the next 24-48 hours. He will need higher dose Lasix at home, suspect 40 mg twice daily. Should have an office visit in about a week after discharge with APP for reassessment.   Satira Sark, M.D., F.A.C.C.

## 2015-11-20 NOTE — Progress Notes (Signed)
Discharge instructions given on medications,and follow up visits,patient verbalized understanding. Prescriptions sent to Pharmacy of choice documented on AVS. Staff accompanied patient to an awaiting vehicle.

## 2015-11-20 NOTE — Care Management Important Message (Signed)
Important Message  Patient Details  Name: Brad Mendoza MRN: [id number] Date of Birth: 03-27-1955   Medicare Important Message Given:  Yes    Sherald Barge, RN 11/20/2015, 1:09 PM

## 2015-11-23 ENCOUNTER — Other Ambulatory Visit: Payer: Self-pay | Admitting: *Deleted

## 2015-11-23 ENCOUNTER — Encounter: Payer: Self-pay | Admitting: *Deleted

## 2015-11-23 NOTE — Patient Outreach (Signed)
11/23/15- Telephone call to pt for transition of care week 1, pt hospitalized 9/25-9/29/17, spoke with pt, HIPAA verified, pt states " I was in the hospital because of fluid"  Pt reports he has "new fluid pill and it's working good"  Pt reports he has all medications and taking as prescribed,  RN CM reviewed medications over the telephone.  Pt does not have a scale and cannot afford one, [facility] will drop ship scale to patient's home,  RN CM ask pt to start weighing daily and keeping a log and reviewed parameters for calling MD for weight gain according to CHF action plan/ zones.  CBG today 68 per pt.  Pt is verifying his appointment with primary MD.  RN CM faxed barrier letter and transition of care note to primary MD Dr. Sabra Heck.  Medications Reviewed Today    Reviewed by Kassie Mends, RN (Registered Nurse) on 11/23/15 at [facility] List Status: <None>  Medication Order Taking? Sig Documenting Provider Last Dose Status Informant  albuterol (PROVENTIL HFA;VENTOLIN HFA) 108 (90 Base) MCG/ACT inhaler [id number] Yes Inhale 2 puffs into the lungs every 6 (six) hours as needed for wheezing or shortness of breath. [name], MD Taking Active Self  amLODipine (NORVASC) 10 MG tablet [id number] Yes Take 1 tablet (10 mg total) by mouth daily. [name], MD Taking Active Self  aspirin EC 81 MG tablet [id number] Yes Take 81 mg by mouth daily. Historical Provider, MD Taking Active Self  Capsaicin (ICY HOT ARTHRITIS THERAPY EX) [id number] Yes Apply 1 application topically 2 (two) times daily as needed (back pain).  Historical Provider, MD Taking Active Self  carvedilol (COREG) 12.5 MG tablet [id number] Yes Take 1 tablet (12.5 mg total) by mouth 2 (two) times daily with a meal. [name], MD Taking Active Self           Med Note Ahmed Prima, AMBER C   Wed Nov 04, 2015 11:54 AM) Patient states that he has not been taking it twice daily because he did not realize that it was prescribed this way.  fluticasone  (FLONASE) 50 MCG/ACT nasal spray [id number] Yes USE 2 SPRAYS IN EACH NOSTRIL DAILY [name], MD Taking Active Self  furosemide (LASIX) 40 MG tablet [id number] No Take 1.5 tablets (60 mg total) by mouth daily.  Patient not taking:  Reported on 11/23/2015   Lendon Colonel, NP Not Taking Active Self  furosemide (LASIX) 40 MG tablet [id number] Yes Take 1 tablet (40 mg total) by mouth 2 (two) times daily. Orvan Falconer, MD Taking Active   gabapentin (NEURONTIN) 300 MG capsule [id number] Yes Take 1 capsule (300 mg total) by mouth 3 (three) times daily. [name], MD Taking Active Self           Med Note Ahmed Prima, AMBER C   Wed Nov 04, 2015 11:55 AM) Patient states that he has only been taking this medication once daily because he did not realize that he was supposed to take it three times daily.  Insulin Detemir (LEVEMIR FLEXTOUCH) 100 UNIT/ML Pen [id number] Yes INJECT 35 UNITS SQ TWICE DAILY WITH BREAKFAST AND SUPPER [name], MD Taking Active Self  insulin lispro (HUMALOG KWIKPEN) 100 UNIT/ML [name] Masker [id number] Yes INJECT UP TO 15 UNITS PRIOR TO EACH MEALAS NEEDED FOR ELAVATED BG [name], MD Taking Active Self  isosorbide mononitrate (IMDUR) 60 MG 24 hr tablet [id number] Yes Take 1.5 tablets (90 mg total) by mouth daily. [name],  MD Taking Active Self  lisinopril (PRINIVIL,ZESTRIL) 40 MG tablet [id number] Yes Take 1 tablet (40 mg total) by mouth daily. [name], MD Taking Active Self  Multiple Vitamins-Minerals (CENTRUM SILVER ADULT 50+ PO) [id number] Yes Take 1 tablet by mouth daily. Historical Provider, MD Taking Active Self  nicotine (NICODERM CQ - DOSED IN MG/24 HOURS) 14 mg/24hr patch [id number] No Place 1 patch (14 mg total) onto the skin daily.  Patient not taking:  Reported on 11/23/2015   Orvan Falconer, MD Not Taking Active   nitroGLYCERIN (NITROSTAT) 0.4 MG SL tablet [id number] Yes Place 1 tablet (0.4 mg total) under the tongue every 5 (five) minutes as needed.  [name], MD Taking Active Self           Med Note Trinidad Curet, Farrel Conners Nov 16, 2015  6:36 PM) 2 tablets within the past 30 days  Omega-3 Fatty Acids (FISH OIL) 1000 MG CPDR [id number] Yes Take 1 capsule by mouth daily. Historical Provider, MD Taking Active Self  pantoprazole (PROTONIX) 40 MG tablet [id number] Yes Take 1 tablet (40 mg total) by mouth daily. [name], MD Taking Active Self  potassium chloride SA (K-DUR,KLOR-CON) 20 MEQ tablet [id number] Yes Take as needed when need to take furosemide  Patient taking differently:  Take 20 mEq by mouth daily.    Lendon Colonel, NP Taking Active Self  rosuvastatin (CRESTOR) 10 MG tablet [id number] Yes Take 1 tablet (10 mg total) by mouth daily. [name], MD Taking Active Self  spironolactone (ALDACTONE) 25 MG tablet [id number] Yes Take 0.5 tablets (12.5 mg total) by mouth daily. Orvan Falconer, MD Taking Active           [facility] CM Care Plan Problem One   Flowsheet Row Most Recent Value  Care Plan Problem One  Knowledge deficit CHF  Role Documenting the Problem One  Care Management Coordinator  Care Plan for Problem One  Active  [facility] Long Term Goal (31-90 days)  pt will verbalize better understanding of disease process CHF to facilitate better outcomes and avoid hospitalization within 90 days.  [facility] Long Term Goal Start Date  11/23/15  Interventions for Problem One Long Term Goal  RN CM talked with pt about importance of daily weights, pt does not have a scale and cannot afford one, RN CM sent email to Algie Coffer to have scale drop shipped to patient's home.  RN CM reviewed 24 hour nurse line number.  [facility] CM Short Term Goal #1 (0-30 days)  pt will verbalize CHF zones/ action plan within 30 days.  [facility] CM Short Term Goal #1 Start Date  11/23/15  Interventions for Short Term Goal #1  RN CM reviewed CHF zones with emphasis on yellow zone and being mindful of how you are feeling each day, importance of calling MD early for change in  health status, symptoms.      PLAN Continue weekly transition of care calls See pt for initial home visit 12/04/15  [name] [facility], [place] Coordinator [phone]

## 2015-11-24 ENCOUNTER — Other Ambulatory Visit: Payer: Self-pay | Admitting: Licensed Clinical Social Worker

## 2015-11-24 ENCOUNTER — Encounter: Payer: Self-pay | Admitting: Licensed Clinical Social Worker

## 2015-11-24 NOTE — Patient Outreach (Signed)
Assessment:  CSW received referral on Brad Mendoza. CSW completed chart review on client.  Client sees Dr. Alain Honey as primary care doctor.  Client is also receiving [facility] nursing support with RN [name].  Client was recently hospitalized from 11/16/15 to 11/20/15. He discharged from the hospital on 11/20/15 and returned to his home in the community.  Client has reported that he is not receiving services with home health agency at present.Client has prescribed medications and is taking medications as prescribed.  CSW and client spoke of client care plan. CSW encouraged client to communicate with CSW in next 30 days to discuss community resources of assistance for client.  Client has support from his wife, Srinivas Lippman. Client has completed [facility] consent form. Client listed Megan Hayduk, his wife, as family contact on [facility] consent form and provided phone contact number for [facility] staff to call Ephriam Jenkins, as needed. CSW provided client information on [facility] program support in pharmacy, nursing and social work areas.  CSW provided client with CSW name and phone number and encouraged client or Jayshaun Phillips to call CSW at [phone] as needed to discuss social work needs of client.    Plan:  Client will communicate with CSW in next 30 days to discuss community resources of assistance for client.  CSW to call client in 3 weeks to assess client needs at that time.  [name].Saloni Lablanc MSW, LCSW Licensed Clinical Social Worker [facility] Care Management [phone]

## 2015-12-04 ENCOUNTER — Encounter: Payer: Self-pay | Admitting: *Deleted

## 2015-12-04 ENCOUNTER — Other Ambulatory Visit: Payer: Self-pay | Admitting: *Deleted

## 2015-12-04 ENCOUNTER — Ambulatory Visit (INDEPENDENT_AMBULATORY_CARE_PROVIDER_SITE_OTHER): Payer: Medicare Other | Admitting: Adult Health

## 2015-12-04 ENCOUNTER — Encounter: Payer: Self-pay | Admitting: Adult Health

## 2015-12-04 VITALS — BP 128/86 | HR 84 | Ht 69.0 in | Wt 204.0 lb

## 2015-12-04 DIAGNOSIS — I10 Essential (primary) hypertension: Secondary | ICD-10-CM

## 2015-12-04 DIAGNOSIS — E7800 Pure hypercholesterolemia, unspecified: Secondary | ICD-10-CM | POA: Diagnosis not present

## 2015-12-04 DIAGNOSIS — I2510 Atherosclerotic heart disease of native coronary artery without angina pectoris: Secondary | ICD-10-CM

## 2015-12-04 DIAGNOSIS — I5042 Chronic combined systolic (congestive) and diastolic (congestive) heart failure: Secondary | ICD-10-CM | POA: Diagnosis not present

## 2015-12-04 NOTE — Progress Notes (Signed)
Cardiology Office Note   Date:  12/04/2015   ID:  Brad Mendoza, DOB Jun 15, 1955, MRN [id number]  PCP:  [name], MD  Cardiologist: [name], NP   Chief Complaint  Patient presents with  . Congestive Heart Failure  . Hypertension      History of Present Illness: Brad Mendoza is a 60 y.o. male who presents for ongoing assessment and management of chronic systolic heart failure, coronary artery disease, hypertension, hyperlipidemia, and nonischemic cardiomyopathy. The patient was last seen in the office on 10/30/2015 after evaluation in the emergency room for fluid retention. He had admitted to medical and dietary noncompliance.  On last office visit his Lasix was increased to 60 mg in the a.m. and 40 mg in the p.m. He was advised on low sodium diet. He is here for follow-up to evaluate his current status and to review labs.  Most recent labs were on 11/20/2015 with a sodium of 134 potassium 4.3 chloride 102 CO2 26 creatinine 1.04. Follow-up labs are not available at this point.  He is here today without any complaints. He has been seen by [facility] earlier today. He has received a scale. He knows to weigh himself and record this. He's been reinforced on low sodium. His weight has been stable. He denies any complaints of rapid heart rhythm, fluid retention, dyspnea on exertion, or PND. He does have some complaints of GI burning.  Past Medical History:  Diagnosis Date  . Acid reflux   . Back pain    f4 and f5  . CHF (congestive heart failure) ([place])    last echo was in March 2016  . Diabetes mellitus 2005  . Diverticulosis   . H/O chest pain 2005   ECHO  . HTN (hypertension)   . Hyperlipidemia   . Onychomycosis     Past Surgical History:  Procedure Laterality Date  . APPENDECTOMY    . CARDIAC CATHETERIZATION  2005   4 frech cath  . COLONOSCOPY N/A 08/01/2013   Procedure: COLONOSCOPY;  Surgeon: Rogene Houston, MD;  Location: [facility];  Service:  Endoscopy;  Laterality: N/A;  rescheduled to [place] notified pt  . ESOPHAGOGASTRODUODENOSCOPY N/A 04/23/2015   Procedure: ESOPHAGOGASTRODUODENOSCOPY (EGD);  Surgeon: Rogene Houston, MD;  Location: [facility];  Service: Endoscopy;  Laterality: N/A;  1:25     Current Outpatient Prescriptions  Medication Sig Dispense Refill  . albuterol (PROVENTIL HFA;VENTOLIN HFA) 108 (90 Base) MCG/ACT inhaler Inhale 2 puffs into the lungs every 6 (six) hours as needed for wheezing or shortness of breath. 1 Inhaler 2  . amLODipine (NORVASC) 10 MG tablet Take 1 tablet (10 mg total) by mouth daily. 30 tablet 6  . aspirin EC 81 MG tablet Take 81 mg by mouth daily.    . Capsaicin (ICY HOT ARTHRITIS THERAPY EX) Apply 1 application topically 2 (two) times daily as needed (back pain).     . carvedilol (COREG) 12.5 MG tablet Take 1 tablet (12.5 mg total) by mouth 2 (two) times daily with a meal. 60 tablet 6  . fluticasone (FLONASE) 50 MCG/ACT nasal spray USE 2 SPRAYS IN EACH NOSTRIL DAILY 16 g 2  . furosemide (LASIX) 40 MG tablet Take 1 tablet (40 mg total) by mouth 2 (two) times daily. 60 tablet 1  . gabapentin (NEURONTIN) 300 MG capsule Take 1 capsule (300 mg total) by mouth 3 (three) times daily. 90 capsule 0  . Insulin Detemir (LEVEMIR FLEXTOUCH) 100 UNIT/ML  Pen INJECT 35 UNITS SQ TWICE DAILY WITH BREAKFAST AND SUPPER 30 mL 0  . insulin lispro (HUMALOG KWIKPEN) 100 UNIT/ML KiwkPen INJECT UP TO 15 UNITS PRIOR TO EACH MEALAS NEEDED FOR ELAVATED BG 15 mL 0  . isosorbide mononitrate (IMDUR) 60 MG 24 hr tablet Take 1.5 tablets (90 mg total) by mouth daily. 45 tablet 6  . lisinopril (PRINIVIL,ZESTRIL) 40 MG tablet Take 1 tablet (40 mg total) by mouth daily. 30 tablet 5  . Multiple Vitamins-Minerals (CENTRUM SILVER ADULT 50+ PO) Take 1 tablet by mouth daily.    . nicotine (NICODERM CQ - DOSED IN MG/24 HOURS) 14 mg/24hr patch Place 1 patch (14 mg total) onto the skin daily. 28 patch 0  . nitroGLYCERIN (NITROSTAT) 0.4 MG  SL tablet Place 1 tablet (0.4 mg total) under the tongue every 5 (five) minutes as needed. 30 tablet 1  . Omega-3 Fatty Acids (FISH OIL) 1000 MG CPDR Take 1 capsule by mouth daily.    . pantoprazole (PROTONIX) 40 MG tablet Take 1 tablet (40 mg total) by mouth daily. 30 tablet 3  . potassium chloride SA (K-DUR,KLOR-CON) 20 MEQ tablet Take as needed when need to take furosemide (Patient taking differently: Take 20 mEq by mouth daily. ) 3 tablet 0  . rosuvastatin (CRESTOR) 10 MG tablet Take 1 tablet (10 mg total) by mouth daily. 30 tablet 6  . spironolactone (ALDACTONE) 25 MG tablet Take 0.5 tablets (12.5 mg total) by mouth daily. 30 tablet 1  . furosemide (LASIX) 40 MG tablet Take 1.5 tablets (60 mg total) by mouth daily. (Patient not taking: Reported on 11/23/2015) 45 tablet 6   No current facility-administered medications for this visit.     Allergies:   Sulfa antibiotics and Penicillins    Social History:  The patient  reports that he has never smoked. His smokeless tobacco use includes Snuff. He reports that he drinks about 0.6 oz of alcohol per week . He reports that he uses drugs, including Marijuana.   Family History:  The patient's family history includes Alcohol abuse in his brother; Cancer in his mother; Diabetes in his father; Hypertension in his father.    ROS: All other systems are reviewed and negative. Unless otherwise mentioned in H&P    PHYSICAL EXAM: VS:  BP 128/86   Pulse 84   Ht 5\' 9"  (1.753 m)   Wt 204 lb (92.5 kg)   SpO2 98%   BMI 30.13 kg/m  , BMI Body mass index is 30.13 kg/m. GEN: Well nourished, well developed, in no acute distress  HEENT: normal  Neck: no JVD, carotid bruits, or masses Cardiac:RRR; no murmurs, rubs, or gallops,no edema  Respiratory:  clear to auscultation bilaterally, normal work of breathing GI: soft, nontender, nondistended, + BS MS: no deformity or atrophy  Skin: warm and dry, no rash Neuro:  Strength and sensation are intact Psych:  euthymic mood, full affect   Recent Labs: 05/08/2015: Magnesium 2.4 10/23/2015: ALT 14 11/16/2015: B Natriuretic Peptide 229.0; Hemoglobin 10.5; Platelets 221; TSH 1.892 11/20/2015: BUN 27; Creatinine, Ser 1.04; Potassium 4.3; Sodium 134    Lipid Panel    Component Value Date/Time   CHOL 130 11/16/2015 2054   CHOL 155 10/23/2015 1109   CHOL 277 (H) 07/11/2012 1308   TRIG 91 11/16/2015 2054   TRIG 658 (HH) 12/16/2013 1459   TRIG 522 (H) 07/11/2012 1308   HDL 45 11/16/2015 2054   HDL 44 10/23/2015 1109   HDL 42 12/16/2013 1459  HDL 39 (L) 07/11/2012 1308   CHOLHDL 2.9 11/16/2015 2054   VLDL 18 11/16/2015 2054   LDLCALC 67 11/16/2015 2054   LDLCALC 83 10/23/2015 1109   LDLCALC 144 (H) 05/10/2013 1225   LDLCALC 134 (H) 07/11/2012 1308      Wt Readings from Last 3 Encounters:  12/04/15 204 lb (92.5 kg)  12/04/15 200 lb (90.7 kg)  11/20/15 203 lb 14.4 oz (92.5 kg)      Other studies Reviewed: Additional studies/ records that were reviewed today include:  Echocardiogram Study Conclusions  - Left ventricle: The cavity size was mildly dilated. Wall   thickness was increased in a pattern of mild LVH. The estimated   ejection fraction was 40%. Diffuse hypokinesis. Features are   consistent with a pseudonormal left ventricular filling pattern,   with concomitant abnormal relaxation and increased filling   pressure (grade 2 diastolic dysfunction). - Ventricular septum: The contour showed diastolic flattening and   systolic flattening. - Mitral valve: Calcified annulus. Mildly thickened leaflets .   There was trivial regurgitation. - Left atrium: The atrium was severely dilated. - Right atrium: The atrium was moderately dilated. Central venous   pressure (est): 15 mm Hg. - Tricuspid valve: There was moderate regurgitation. - Pulmonary arteries: Systolic pressure was moderately to severely   increased. PA peak pressure: 64 mm Hg (S). - Pericardium, extracardiac: There was no  pericardial effusion.  Impressions:  - Mildly dilated LV with mild LVH and LVEF approximately 40%.   Overall diffuse hypokinesis with LV septal flattening consistent   with RV pressure and volume overload. Grade 2 diastolic   dysfunction with increased LV filling pressure. Severe left   atrial enlargement and moderate right atrial enlargement. MAC   with mildly thickened mitral leaflets and trivial mitral   regurgitation. Moderate tricuspid regurgitation with PASP   estimated 64 mmHg.  ASSESSMENT AND PLAN:  1.  Acute on chronic combined CHF. Weight has been stable. He denies dyspnea or any symptoms of decompensation. I will not make any changes on his medication regimen at this time. He has received a scale and he will be weighing every day and recording this. He is to continue to have follow-up with [facility].  I will repeat his echocardiogram in 2-1/2 months, and then follow up with him in 3 months.   2. Hypertension: Blood pressure is well controlled currently. Continue current medication regimen. He will need follow-up BMET before next appointment.  3. Hypercholesterolemia: Continue Crestor daily. Follow-up labs per primary care or on next office visit.  Current medicines are reviewed at length with the patient today.    Labs/ tests ordered today include:   Orders Placed This Encounter  Procedures  . ECHOCARDIOGRAM COMPLETE     Disposition:   FU with 2 and half months for echocardiogram, 3 months for follow-up appointment.  Signed, Jory Sims, NP  12/04/2015 1:54 PM    [facility] [address], [place], [place] [address] Phone: [phone]; Fax: [phone]

## 2015-12-04 NOTE — Patient Instructions (Signed)
Your physician recommends that you schedule a follow-up appointment in: after echo  Your physician has requested that you have an echocardiogram at the end of December . Echocardiography is a painless test that uses sound waves to create images of your heart. It provides your doctor with information about the size and shape of your heart and how well your heart's chambers and valves are working. This procedure takes approximately one hour. There are no restrictions for this procedure.       Your physician recommends that you continue on your current medications as directed. Please refer to the Current Medication list given to you today.     Thank you for choosing [facility] !

## 2015-12-04 NOTE — Progress Notes (Signed)
Name: Brad Mendoza    DOB: 1955-07-08  Age: 60 y.o.  MR#: [id number]       PCP:  [name], MD      Insurance: Payor: MEDICARE / Plan: MEDICARE PART A AND B / Product Type: *No Product type* /   CC:   No chief complaint on file.   VS Vitals:   12/04/15 1339  Weight: 204 lb (92.5 kg)  Height: 5\' 9"  (1.753 m)    Weights Current Weight  12/04/15 204 lb (92.5 kg)  12/04/15 200 lb (90.7 kg)  11/20/15 203 lb 14.4 oz (92.5 kg)    Blood Pressure  BP Readings from Last 3 Encounters:  12/04/15 108/60  11/20/15 135/61  11/04/15 148/84     Admit date:  (Not on file) Last encounter with RMR:  10/30/2015   Allergy Sulfa antibiotics and Penicillins  Current Outpatient Prescriptions  Medication Sig Dispense Refill  . albuterol (PROVENTIL HFA;VENTOLIN HFA) 108 (90 Base) MCG/ACT inhaler Inhale 2 puffs into the lungs every 6 (six) hours as needed for wheezing or shortness of breath. 1 Inhaler 2  . amLODipine (NORVASC) 10 MG tablet Take 1 tablet (10 mg total) by mouth daily. 30 tablet 6  . aspirin EC 81 MG tablet Take 81 mg by mouth daily.    . Capsaicin (ICY HOT ARTHRITIS THERAPY EX) Apply 1 application topically 2 (two) times daily as needed (back pain).     . carvedilol (COREG) 12.5 MG tablet Take 1 tablet (12.5 mg total) by mouth 2 (two) times daily with a meal. 60 tablet 6  . fluticasone (FLONASE) 50 MCG/ACT nasal spray USE 2 SPRAYS IN EACH NOSTRIL DAILY 16 g 2  . furosemide (LASIX) 40 MG tablet Take 1 tablet (40 mg total) by mouth 2 (two) times daily. 60 tablet 1  . gabapentin (NEURONTIN) 300 MG capsule Take 1 capsule (300 mg total) by mouth 3 (three) times daily. 90 capsule 0  . Insulin Detemir (LEVEMIR FLEXTOUCH) 100 UNIT/ML Pen INJECT 35 UNITS SQ TWICE DAILY WITH BREAKFAST AND SUPPER 30 mL 0  . insulin lispro (HUMALOG KWIKPEN) 100 UNIT/ML KiwkPen INJECT UP TO 15 UNITS PRIOR TO EACH MEALAS NEEDED FOR ELAVATED BG 15 mL 0  . isosorbide mononitrate (IMDUR) 60 MG 24 hr tablet Take  1.5 tablets (90 mg total) by mouth daily. 45 tablet 6  . lisinopril (PRINIVIL,ZESTRIL) 40 MG tablet Take 1 tablet (40 mg total) by mouth daily. 30 tablet 5  . Multiple Vitamins-Minerals (CENTRUM SILVER ADULT 50+ PO) Take 1 tablet by mouth daily.    . nicotine (NICODERM CQ - DOSED IN MG/24 HOURS) 14 mg/24hr patch Place 1 patch (14 mg total) onto the skin daily. 28 patch 0  . nitroGLYCERIN (NITROSTAT) 0.4 MG SL tablet Place 1 tablet (0.4 mg total) under the tongue every 5 (five) minutes as needed. 30 tablet 1  . Omega-3 Fatty Acids (FISH OIL) 1000 MG CPDR Take 1 capsule by mouth daily.    . pantoprazole (PROTONIX) 40 MG tablet Take 1 tablet (40 mg total) by mouth daily. 30 tablet 3  . potassium chloride SA (K-DUR,KLOR-CON) 20 MEQ tablet Take as needed when need to take furosemide (Patient taking differently: Take 20 mEq by mouth daily. ) 3 tablet 0  . rosuvastatin (CRESTOR) 10 MG tablet Take 1 tablet (10 mg total) by mouth daily. 30 tablet 6  . spironolactone (ALDACTONE) 25 MG tablet Take 0.5 tablets (12.5 mg total) by mouth daily. 30 tablet 1  .  furosemide (LASIX) 40 MG tablet Take 1.5 tablets (60 mg total) by mouth daily. (Patient not taking: Reported on 11/23/2015) 45 tablet 6   No current facility-administered medications for this visit.     Discontinued Meds:   There are no discontinued medications.  Patient Active Problem List   Diagnosis Date Noted  . CHF exacerbation ([place]) 11/17/2015  . Acute on chronic combined systolic and diastolic CHF (congestive heart failure) ([place]) 11/16/2015  . Acute left eye pain   . Hyperglycemia 05/08/2015  . Type 2 diabetes mellitus with hyperosmolar nonketotic hyperglycemia ([place]) 05/08/2015  . AKI (acute kidney injury) ([place]) 05/08/2015  . Hyponatremia 05/08/2015  . Hypochloremia 05/08/2015  . Type 2 diabetes mellitus with diabetic polyneuropathy ([place]) 12/16/2013  . CAD (coronary artery disease) 09/18/2013  . Heme positive stool 06/12/2013  . Change in  stool 06/12/2013  . Seasonal allergic rhinitis 07/11/2012  . Polysubstance abuse 12/24/2010  . Bronchitis 12/24/2010  . Onychomycosis   . Acid reflux   . Nonischemic cardiomyopathy ([place])   . Type 2 diabetes mellitus with complications ([place])   . Hyperlipemia 11/16/2009  . Obesity 11/16/2009  . Tobacco dependence 11/16/2009  . Essential hypertension 11/16/2009  . CHEST PAIN UNSPECIFIED 11/16/2009    LABS    Component Value Date/Time   NA 134 (L) 11/20/2015 0554   NA 136 11/19/2015 0929   NA 139 11/18/2015 0520   NA 138 10/23/2015 1109   NA 130 (L) 10/08/2015 1421   NA 135 10/23/2014 1221   K 4.3 11/20/2015 0554   K 4.1 11/19/2015 0929   K 3.5 11/18/2015 0520   CL 102 11/20/2015 0554   CL 105 11/19/2015 0929   CL 104 11/18/2015 0520   CO2 26 11/20/2015 0554   CO2 26 11/19/2015 0929   CO2 27 11/18/2015 0520   GLUCOSE 120 (H) 11/20/2015 0554   GLUCOSE 145 (H) 11/19/2015 0929   GLUCOSE 72 11/18/2015 0520   BUN 27 (H) 11/20/2015 0554   BUN 27 (H) 11/19/2015 0929   BUN 30 (H) 11/18/2015 0520   BUN 23 10/23/2015 1109   BUN 21 10/08/2015 1421   BUN 20 10/23/2014 1221   CREATININE 1.04 11/20/2015 0554   CREATININE 1.13 11/19/2015 0929   CREATININE 1.15 11/18/2015 0520   CREATININE 1.04 07/11/2012 1308   CALCIUM 7.9 (L) 11/20/2015 0554   CALCIUM 7.9 (L) 11/19/2015 0929   CALCIUM 8.5 (L) 11/18/2015 0520   GFRNONAA >60 11/20/2015 0554   GFRNONAA >60 11/19/2015 0929   GFRNONAA >60 11/18/2015 0520   GFRNONAA 80 07/11/2012 1308   GFRAA >60 11/20/2015 0554   GFRAA >60 11/19/2015 0929   GFRAA >60 11/18/2015 0520   GFRAA >89 07/11/2012 1308   CMP     Component Value Date/Time   NA 134 (L) 11/20/2015 0554   NA 138 10/23/2015 1109   K 4.3 11/20/2015 0554   CL 102 11/20/2015 0554   CO2 26 11/20/2015 0554   GLUCOSE 120 (H) 11/20/2015 0554   BUN 27 (H) 11/20/2015 0554   BUN 23 10/23/2015 1109   CREATININE 1.04 11/20/2015 0554   CREATININE 1.04 07/11/2012 1308   CALCIUM  7.9 (L) 11/20/2015 0554   PROT 5.3 (L) 10/23/2015 1109   ALBUMIN 2.9 (L) 10/23/2015 1109   AST 15 10/23/2015 1109   ALT 14 10/23/2015 1109   ALKPHOS 121 (H) 10/23/2015 1109   BILITOT 0.2 10/23/2015 1109   GFRNONAA >60 11/20/2015 0554   GFRNONAA 80 07/11/2012 1308   GFRAA >60  11/20/2015 0554   GFRAA >89 07/11/2012 1308       Component Value Date/Time   WBC 7.1 11/16/2015 1148   WBC 5.6 11/04/2015 1144   WBC 7.6 10/17/2015 1014   HGB 10.5 (L) 11/16/2015 1148   HGB 10.7 (L) 11/04/2015 1144   HGB 12.1 (L) 10/17/2015 1014   HCT 32.5 (L) 11/16/2015 1148   HCT 32.5 (L) 11/04/2015 1144   HCT 36.3 (L) 10/17/2015 1014   MCV 84.0 11/16/2015 1148   MCV 84.2 11/04/2015 1144   MCV 83.6 10/17/2015 1014   MCV 91.0 10/11/2012 1321    Lipid Panel     Component Value Date/Time   CHOL 130 11/16/2015 2054   CHOL 155 10/23/2015 1109   CHOL 277 (H) 07/11/2012 1308   TRIG 91 11/16/2015 2054   TRIG 658 (HH) 12/16/2013 1459   TRIG 522 (H) 07/11/2012 1308   HDL 45 11/16/2015 2054   HDL 44 10/23/2015 1109   HDL 42 12/16/2013 1459   HDL 39 (L) 07/11/2012 1308   CHOLHDL 2.9 11/16/2015 2054   VLDL 18 11/16/2015 2054   LDLCALC 67 11/16/2015 2054   LDLCALC 83 10/23/2015 1109   LDLCALC 144 (H) 05/10/2013 1225   LDLCALC 134 (H) 07/11/2012 1308    ABG No results found for: PHART, PCO2ART, PO2ART, HCO3, TCO2, ACIDBASEDEF, O2SAT   Lab Results  Component Value Date   TSH 1.892 11/16/2015   BNP (last 3 results)  Recent Labs  10/17/15 1014 11/04/15 1144 11/16/15 1150  BNP 581.0* 307.0* 229.0*    ProBNP (last 3 results) No results for input(s): PROBNP in the last 8760 hours.  Cardiac Panel (last 3 results) No results for input(s): CKTOTAL, CKMB, TROPONINI, RELINDX in the last 72 hours.  Iron/TIBC/Ferritin/ %Sat No results found for: IRON, TIBC, FERRITIN, IRONPCTSAT   EKG Orders placed or performed during the hospital encounter of 11/16/15  . EKG 12-Lead  . EKG 12-Lead  . EKG  12-Lead (at 6am)  . EKG 12-Lead (at 6am)  . EKG 12-Lead  . EKG 12-Lead  . EKG     Prior Assessment and Plan Problem List as of 12/04/2015 Reviewed: 12/04/2015 12:30 PM by Kassie Mends, RN     Cardiovascular and Mediastinum   Nonischemic cardiomyopathy [facility])   Last Assessment & Plan 12/24/2010 Office Visit Written 12/24/2010  1:07 PM by Ezra Sites, MD    Continue medical therapy for now. The patient will need to assure compliance. We will follow him closely and recheck his ejection fraction. At present time is not a candidate for ICD because of his history of substance abuse as well as noncompliance with medical therapy and suboptimal treatment. I also do not think invasive ischemia evaluation is no further necessary at this point in time. The patient is asymptomatic.      CAD (coronary artery disease)   Essential hypertension   Acute on chronic combined systolic and diastolic CHF (congestive heart failure) ([place])   CHF exacerbation ([place])     Respiratory   Bronchitis   Last Assessment & Plan 12/24/2010 Office Visit Written 12/24/2010  1:01 PM by Ezra Sites, MD    I have given the patient a prescription of doxycycline 100 mg by mouth twice a day      Seasonal allergic rhinitis     Digestive   Acid reflux     Endocrine   Type 2 diabetes mellitus with complications ([place])   Type 2 diabetes mellitus with diabetic  polyneuropathy ([place])   Type 2 diabetes mellitus with hyperosmolar nonketotic hyperglycemia ([facility])     Musculoskeletal and Integument   Onychomycosis     Genitourinary   AKI (acute kidney injury) ([place])     Other   Hyperlipemia   Obesity   Tobacco dependence   Last Assessment & Plan 12/24/2010 Office Visit Written 12/24/2010  1:01 PM by Ezra Sites, MD    I counseled the patient regarding his tobacco use.      CHEST PAIN UNSPECIFIED   Last Assessment & Plan 12/24/2010 Office Visit Written 12/24/2010 12:53 PM by Ezra Sites, MD    Patient had atypical chest  pain. He reports no recurrent symptoms. Continue current medical therapy. No clear indication for invasive evaluation.      Polysubstance abuse   Heme positive stool   Change in stool   Hyperglycemia   Hyponatremia   Hypochloremia   Acute left eye pain       Imaging: Dg Chest 2 View  Result Date: 11/16/2015 CLINICAL DATA:  Chest pain and shortness of breath. EXAM: CHEST  2 VIEW COMPARISON:  11/04/2015, 10/17/2015 and 04/17/2015 and 11/25/2014 FINDINGS: Increasing cardiomegaly. Distention of the azygos vein. Pulmonary vascularity is normal. Increasing bilateral pleural effusions and, peribronchial thickening and patchy infiltrates at the lung bases. Bones are normal. IMPRESSION: Increasing pleural effusions and patchy bilateral pulmonary infiltrates. This could represent atypical pulmonary edema. Electronically Signed   By: Lorriane Shire M.D.   On: 11/16/2015 11:56

## 2015-12-04 NOTE — Patient Outreach (Signed)
[facility]) Care Management   12/04/2015  Brad Mendoza Jun 01, 1955 [id number]  Brad Mendoza is an 60 y.o. male  Subjective: Initial home visit with pt, HIPAA verified, pt states he is married but he and spouse live apart, pt has some friends that assist him at times and patient's wife assists him also if needed, pt still drives.  Pt states he received scale from [facility] and is weighing daily.  Pt reports he "could do better with low sodium diet" and is trying to.  Pt reports he checks CBG TID but does not record, fasting CBG today 149.  Objective:   Vitals:   12/04/15 1208  BP: 108/60  Pulse: 79  Resp: 18  SpO2: 99%  Weight: 200 lb (90.7 kg)  Height: 1.753 m (5\' 9" )   ROS  Physical Exam  Encounter Medications:   Outpatient Encounter Prescriptions as of 12/04/2015  Medication Sig Note  . albuterol (PROVENTIL HFA;VENTOLIN HFA) 108 (90 Base) MCG/ACT inhaler Inhale 2 puffs into the lungs every 6 (six) hours as needed for wheezing or shortness of breath.   [name] amLODipine (NORVASC) 10 MG tablet Take 1 tablet (10 mg total) by mouth daily.   [name] aspirin EC 81 MG tablet Take 81 mg by mouth daily.   . Capsaicin (ICY HOT ARTHRITIS THERAPY EX) Apply 1 application topically 2 (two) times daily as needed (back pain).    . carvedilol (COREG) 12.5 MG tablet Take 1 tablet (12.5 mg total) by mouth 2 (two) times daily with a meal. 11/04/2015: Patient states that he has not been taking it twice daily because he did not realize that it was prescribed this way.  . fluticasone (FLONASE) 50 MCG/ACT nasal spray USE 2 SPRAYS IN EACH NOSTRIL DAILY   . furosemide (LASIX) 40 MG tablet Take 1 tablet (40 mg total) by mouth 2 (two) times daily.   [name] gabapentin (NEURONTIN) 300 MG capsule Take 1 capsule (300 mg total) by mouth 3 (three) times daily. 11/04/2015: Patient states that he has only been taking this medication once daily because he did not realize that he was supposed to take it three times  daily.  . Insulin Detemir (LEVEMIR FLEXTOUCH) 100 UNIT/ML Pen INJECT 35 UNITS SQ TWICE DAILY WITH BREAKFAST AND SUPPER   . insulin lispro (HUMALOG KWIKPEN) 100 UNIT/ML KiwkPen INJECT UP TO 15 UNITS PRIOR TO EACH MEALAS NEEDED FOR ELAVATED BG   . isosorbide mononitrate (IMDUR) 60 MG 24 hr tablet Take 1.5 tablets (90 mg total) by mouth daily.   [name] lisinopril (PRINIVIL,ZESTRIL) 40 MG tablet Take 1 tablet (40 mg total) by mouth daily.   . Multiple Vitamins-Minerals (CENTRUM SILVER ADULT 50+ PO) Take 1 tablet by mouth daily.   . nitroGLYCERIN (NITROSTAT) 0.4 MG SL tablet Place 1 tablet (0.4 mg total) under the tongue every 5 (five) minutes as needed. 11/16/2015: 2 tablets within the past 30 days  . Omega-3 Fatty Acids (FISH OIL) 1000 MG CPDR Take 1 capsule by mouth daily.   . pantoprazole (PROTONIX) 40 MG tablet Take 1 tablet (40 mg total) by mouth daily.   . potassium chloride SA (K-DUR,KLOR-CON) 20 MEQ tablet Take as needed when need to take furosemide (Patient taking differently: Take 20 mEq by mouth daily. )   . rosuvastatin (CRESTOR) 10 MG tablet Take 1 tablet (10 mg total) by mouth daily.   [name] spironolactone (ALDACTONE) 25 MG tablet Take 0.5 tablets (12.5 mg total) by mouth daily.   . furosemide (LASIX) 40  MG tablet Take 1.5 tablets (60 mg total) by mouth daily. (Patient not taking: Reported on 11/23/2015)   . nicotine (NICODERM CQ - DOSED IN MG/24 HOURS) 14 mg/24hr patch Place 1 patch (14 mg total) onto the skin daily.    No facility-administered encounter medications on file as of 12/04/2015.     Functional Status:   In your present state of health, do you have any difficulty performing the following activities: 12/04/2015 11/24/2015  Hearing? N N  Vision? N N  Difficulty concentrating or making decisions? [name]  Walking or climbing stairs? N N  Dressing or bathing? N N  Doing errands, shopping? N N  Preparing Food and eating ? N Y  Using the Toilet? - N  In the past six months, have you  accidently leaked urine? N N  Do you have problems with loss of bowel control? N N  Managing your Medications? N Y  Managing your Finances? N Y  Housekeeping or managing your Housekeeping? N Y  Some recent data might be hidden    Fall/Depression Screening:    PHQ 2/9 Scores 12/04/2015 11/24/2015 10/23/2015 10/08/2015 03/13/2015 05/14/2014 12/16/2013  PHQ - 2 Score 0 0 1 1 0 1 0   Fall Risk  12/04/2015 11/24/2015 03/13/2015 05/14/2014 12/16/2013  Falls in the past year? No No No No No  Risk for fall due to : Medication side effect Medication side effect - - -    Assessment:  Patient feels he needs to "work on heart failure before anything else, then diabetes".  RN CM gave pt THN calendar, EMMI handouts (HF- keeping track of weight, Salt smart, when to call MD or 911, Rest, exercise and blood pressure), low sodium poster and reviewed with pt, reviewed foods high in carbohydrates to limit and reviewed portion control. RN CM observed medications and reviewed with pt.   CSW is working with pt, pt needs assistance with home repairs, RN CM wrote out phone number for [facility], pt is able to call them. RNCM had to limit time for today's visit as pt said he had to leave for appointment with cardiologist.  RN CM faxed barrier letter and initial home visit to primary care MD Dr. Sabra Heck.  [facility] CM Care Plan Problem One   Flowsheet Row Most Recent Value  Care Plan for Problem One  Active  THN Long Term Goal (31-90 days)  pt will verbalize better understanding of disease process CHF to facilitate better outcomes and avoid hospitalization within 90 days.  THN Long Term Goal Start Date  11/23/15  Interventions for Problem One Long Term Goal  Pt did receive scale from [facility] and is weighing daily but not recording,  pt will start recording in [facility] calendar.  THN CM Short Term Goal #1 (0-30 days)  Pt will weigh daily and record in [facility] calendar within 30 days.  THN CM Short Term Goal #1 Start Date   12/04/15  Interventions for Short Term Goal #1  RN CM praised and encouraged pt for weighing daily and reviewed importance of daily weights and correlation with HF.  THN CM Short Term Goal #2 (0-30 days)  Pt will verbalize CHF zones within 30 days.  THN CM Short Term Goal #2 Start Date  12/04/15  Interventions for Short Term Goal #2  RN CM reviewed CHF zones/ action plan in [facility] calendar with emphasis on yellow zone.      Plan: follow up with home visit 01/01/16 Assess weight and  CBG log Review low sodium diet, carbohydrate modified diet  [name] [facility], BSN [place] Coordinator [phone]

## 2015-12-08 ENCOUNTER — Ambulatory Visit (INDEPENDENT_AMBULATORY_CARE_PROVIDER_SITE_OTHER): Payer: Medicare Other | Admitting: Family Medicine

## 2015-12-08 ENCOUNTER — Encounter: Payer: Self-pay | Admitting: Family Medicine

## 2015-12-08 VITALS — BP 126/71 | HR 80 | Temp 98.0°F | Ht 69.0 in | Wt 207.0 lb

## 2015-12-08 DIAGNOSIS — E7800 Pure hypercholesterolemia, unspecified: Secondary | ICD-10-CM | POA: Diagnosis not present

## 2015-12-08 DIAGNOSIS — E118 Type 2 diabetes mellitus with unspecified complications: Secondary | ICD-10-CM | POA: Diagnosis not present

## 2015-12-08 DIAGNOSIS — I10 Essential (primary) hypertension: Secondary | ICD-10-CM | POA: Diagnosis not present

## 2015-12-08 DIAGNOSIS — I2510 Atherosclerotic heart disease of native coronary artery without angina pectoris: Secondary | ICD-10-CM

## 2015-12-08 NOTE — Progress Notes (Signed)
Subjective:    Patient ID: [name], male    DOB: 05-19-1955, 60 y.o.   MRN: [id number]  HPI Patient here today for 2 month follow up on diabetes, edema and recent hospital stay.  Patient was hospitalized for congestive failure and diuresed. Since being out of the hospital whose feeling lots better. He weighs himself daily and knows to call doctor if his weight appreciably changes from one day to the next. You line he has some concerns about possible recurrence of right inguinal hernia.     Patient Active Problem List   Diagnosis Date Noted  . CHF exacerbation ([place]) 11/17/2015  . Acute on chronic combined systolic and diastolic CHF (congestive heart failure) ([place]) 11/16/2015  . Acute left eye pain   . Hyperglycemia 05/08/2015  . Type 2 diabetes mellitus with hyperosmolar nonketotic hyperglycemia ([place]) 05/08/2015  . AKI (acute kidney injury) ([place]) 05/08/2015  . Hyponatremia 05/08/2015  . Hypochloremia 05/08/2015  . Type 2 diabetes mellitus with diabetic polyneuropathy ([place]) 12/16/2013  . CAD (coronary artery disease) 09/18/2013  . Heme positive stool 06/12/2013  . Change in stool 06/12/2013  . Seasonal allergic rhinitis 07/11/2012  . Polysubstance abuse 12/24/2010  . Bronchitis 12/24/2010  . Onychomycosis   . Acid reflux   . Nonischemic cardiomyopathy ([place])   . Type 2 diabetes mellitus with complications ([place])   . Hyperlipemia 11/16/2009  . Obesity 11/16/2009  . Tobacco dependence 11/16/2009  . Essential hypertension 11/16/2009  . CHEST PAIN UNSPECIFIED 11/16/2009   Outpatient Encounter Prescriptions as of 12/08/2015  Medication Sig  . amLODipine (NORVASC) 10 MG tablet Take 1 tablet (10 mg total) by mouth daily.  [name] aspirin EC 81 MG tablet Take 81 mg by mouth daily.  . Capsaicin (ICY HOT ARTHRITIS THERAPY EX) Apply 1 application topically 2 (two) times daily as needed (back pain).   . carvedilol (COREG) 12.5 MG tablet Take 1 tablet (12.5 mg total) by mouth 2 (two)  times daily with a meal.  . fluticasone (FLONASE) 50 MCG/ACT nasal spray USE 2 SPRAYS IN EACH NOSTRIL DAILY  . furosemide (LASIX) 40 MG tablet Take 1.5 tablets (60 mg total) by mouth daily.  [name] gabapentin (NEURONTIN) 300 MG capsule Take 1 capsule (300 mg total) by mouth 3 (three) times daily.  . Insulin Detemir (LEVEMIR FLEXTOUCH) 100 UNIT/ML Pen INJECT 35 UNITS SQ TWICE DAILY WITH BREAKFAST AND SUPPER  . insulin lispro (HUMALOG KWIKPEN) 100 UNIT/ML KiwkPen INJECT UP TO 15 UNITS PRIOR TO EACH MEALAS NEEDED FOR ELAVATED BG  . isosorbide mononitrate (IMDUR) 60 MG 24 hr tablet Take 1.5 tablets (90 mg total) by mouth daily.  [name] lisinopril (PRINIVIL,ZESTRIL) 40 MG tablet Take 1 tablet (40 mg total) by mouth daily.  . Multiple Vitamins-Minerals (CENTRUM SILVER ADULT 50+ PO) Take 1 tablet by mouth daily.  . nicotine (NICODERM CQ - DOSED IN MG/24 HOURS) 14 mg/24hr patch Place 1 patch (14 mg total) onto the skin daily.  . Omega-3 Fatty Acids (FISH OIL) 1000 MG CPDR Take 1 capsule by mouth daily.  . pantoprazole (PROTONIX) 40 MG tablet Take 1 tablet (40 mg total) by mouth daily.  . potassium chloride SA (K-DUR,KLOR-CON) 20 MEQ tablet Take as needed when need to take furosemide (Patient taking differently: Take 20 mEq by mouth daily. )  . rosuvastatin (CRESTOR) 10 MG tablet Take 1 tablet (10 mg total) by mouth daily.  [name] spironolactone (ALDACTONE) 25 MG tablet Take 0.5 tablets (12.5 mg total) by mouth daily.  [name] albuterol (  PROVENTIL HFA;VENTOLIN HFA) 108 (90 Base) MCG/ACT inhaler Inhale 2 puffs into the lungs every 6 (six) hours as needed for wheezing or shortness of breath. (Patient not taking: Reported on 12/08/2015)  . nitroGLYCERIN (NITROSTAT) 0.4 MG SL tablet Place 1 tablet (0.4 mg total) under the tongue every 5 (five) minutes as needed. (Patient not taking: Reported on 12/08/2015)  . [DISCONTINUED] furosemide (LASIX) 40 MG tablet Take 1 tablet (40 mg total) by mouth 2 (two) times daily.   No  facility-administered encounter medications on file as of 12/08/2015.       Review of Systems  Constitutional: Negative.   HENT: Negative.   Eyes: Negative.   Respiratory: Negative.   Cardiovascular: Negative.   Gastrointestinal: Negative.   Endocrine: Negative.   Genitourinary: Negative.        Right side groin pain  Musculoskeletal: Negative.   Skin: Negative.   Allergic/Immunologic: Negative.   Neurological: Negative.   Hematological: Negative.   Psychiatric/Behavioral: Negative.        Objective:   Physical Exam  Constitutional: He is oriented to person, place, and time. He appears well-developed and well-nourished.  Cardiovascular: Normal rate, regular rhythm and normal heart sounds.   Pulmonary/Chest: Effort normal.  Abdominal:  Based on my exam. I think there is a small right inguinal hernia present  Neurological: He is alert and oriented to person, place, and time.  Psychiatric: He has a normal mood and affect. His behavior is normal.   BP 126/71 (BP Location: Right Arm)   Pulse 80   Temp 98 F (36.7 C) (Oral)   Ht 5\' 9"  (1.753 m)   Wt 207 lb (93.9 kg)   BMI 30.57 kg/m         Assessment & Plan:  1. Type 2 diabetes mellitus with complication, without long-term current use of insulin (HCC) Last A1c was 13.1 about 3 weeks ago patient has modified his diet.  2. Essential hypertension Blood pressure is good at 126/71   3. Pure hypercholestero [name] MD

## 2015-12-10 ENCOUNTER — Other Ambulatory Visit: Payer: Self-pay | Admitting: *Deleted

## 2015-12-10 ENCOUNTER — Other Ambulatory Visit: Payer: Self-pay | Admitting: Family Medicine

## 2015-12-10 DIAGNOSIS — I2510 Atherosclerotic heart disease of native coronary artery without angina pectoris: Secondary | ICD-10-CM

## 2015-12-10 DIAGNOSIS — I428 Other cardiomyopathies: Secondary | ICD-10-CM

## 2015-12-10 NOTE — Patient Outreach (Signed)
Telephone call to pt for transition of care week 3, spoke with pt, HIPAA verified, pt reports he is doing well, weighing daily, with weight today 200 pounds, CBG fasting today 198, has all medications and taking as prescribed, no new issues or concerns reported.  [facility] CM Care Plan Problem One   Flowsheet Row Most Recent Value  Care Plan for Problem One  Active  THN Long Term Goal (31-90 days)  pt will verbalize better understanding of disease process CHF to facilitate better outcomes and avoid hospitalization within 90 days.  THN Long Term Goal Start Date  11/23/15  Interventions for Problem One Long Term Goal  RN CM reviewed importance of daily weights,  weight today is 200 pounds.  THN CM Short Term Goal #1 (0-30 days)  Pt will weigh daily and record in [facility] calendar within 30 days.  THN CM Short Term Goal #1 Start Date  12/04/15  Interventions for Short Term Goal #1  RN CM reviewed importance of continuing to weigh daily  THN CM Short Term Goal #2 (0-30 days)  Pt will verbalize CHF zones within 30 days.  THN CM Short Term Goal #2 Start Date  12/04/15  Interventions for Short Term Goal #2  RN CM reinforced CHF zones/ action plan in [facility] calendar with emphasis on yellow zone.      PLAN Continue weekly transition of care calls  [name] [facility], [place] Coordinator [phone]

## 2015-12-15 ENCOUNTER — Other Ambulatory Visit: Payer: Self-pay | Admitting: Licensed Clinical Social Worker

## 2015-12-15 NOTE — Patient Outreach (Signed)
Assessment:  CSW spoke via phone with client on 12/15/15. CSW verified client identity. CSW received verbal permission from client on 12/15/15 for CSW to speak with client about current client needs. Client sees Dr. Alain Honey as primary care doctor.  Client said he saw Dr. Sabra Heck for a medical appointment two weeks ago. Client said he has prescribed medications and is taking medications as prescribed. Client is receiving [facility] nursing support with RN [name].  Client still drives. Client has support from his wife and from friends.   CSW and client spoke of client care plan. CSW encouraged client to communicate with CSW in next 30 days to discuss community resources of assistance for client. Client is in need of home repairs.  RN [name] had written number of [employer] for client on her recent home visit. CSW talked with client further about that agency and encouraged client to call agency to seek assistance. CSW informed client that sometimes request placed with that agency may take a few months to be addressed since agency is still busy with distaster relief projects.  Client sees cardiologist as scheduled. Client has support from his wife, Oshae Simmering.  Client now has scale to use for monitoring his weight.  He takes a diuretic medication as prescribed. Client said he feels that his weight is fairly stable now.  Client and CSW spoke of home repair needs of client.  CSW encouraged client to call [employer] [employer] to request application from that agency for home repairs for client.  Client said he had adequate food supply. Client said his wife, Jeanett Schlein, transports client to and from client medical appointments and to complete needed community errands.  Client said he is walking well. Client did not speak of any pain issues. Client said he does well with activities of daily living.  CSW gave client [place] phone number of [phone]. CSW spoke with [place] about  [facility] program support in nursing, social work and pharmacy. CSW encouraged [place] to call CSW at [phone] as needed to discuss social work needs of client.    Plan:  Client to communicate with CSW in next 30 days to discuss community resources of assistance for client.  CSW to call client in 4 weeks to assess client needs.  [name].Tramar Brueckner MSW, LCSW Licensed Clinical Social Worker [facility] Care Management [phone]

## 2015-12-16 ENCOUNTER — Other Ambulatory Visit: Payer: Self-pay | Admitting: *Deleted

## 2015-12-16 NOTE — Patient Outreach (Signed)
Transition of care call #4 attempted. No one was home but I did leave a message and requested a return call.  Deloria Lair [facility] [place] [phone]

## 2015-12-18 ENCOUNTER — Other Ambulatory Visit: Payer: Self-pay | Admitting: *Deleted

## 2015-12-18 NOTE — Patient Outreach (Signed)
Transition of care call. Pt is doing well. He reports his weight is stable, denies SOB or edema. He reports he is having a jittery, nervouse feeling when he lies down at night. He has not needed his albuterol, so it is not this. His glucose levels are running normally and he says the feeling is different than when his sugar is low. He says it is just since he was started on spironolactone. He takes this in the am. He is also taking lasix bid now and I have advised him to take his evening lasix no later than 4 pm.  I have asked him to write down in his [facility] calendar when he has these feelings and any circumstances around it which may be contributing factors.  He also asks if [facility] may be able to help him get a BP monitor. He is on medicare/medicaid. I told him I would pass this request back to [place].  Deloria Lair [facility] [place] [phone]

## 2015-12-21 ENCOUNTER — Other Ambulatory Visit: Payer: Self-pay | Admitting: *Deleted

## 2015-12-21 NOTE — Patient Outreach (Signed)
Telephone call from pt. Brad Mendoza says he was supposed to have a nephrology consult today but he could never find out from his primary or cardiology office who this was too. He even called the [facility] nephrology office and they did not have an appointment for him. I told him I would forward this information to his primary care manager, [name], RN. I did review the chart and the only thing I could identify pertaining to this was a telephone call on 11/12/15 which says a nephrology consult was placed. No more detail was given that I could see.  Brad Mendoza also reports he is having a lot of gas. I explained that diabetics are prone to this problem as digestion is slower and gas can build up. I suggested he try OTC Simethicone for relief.  He is still also having the trembly feeling when he lies down.  Deloria Lair [facility] [place] [phone]

## 2015-12-22 ENCOUNTER — Other Ambulatory Visit: Payer: Self-pay | Admitting: *Deleted

## 2015-12-22 NOTE — Patient Outreach (Signed)
12/22/15- Telephone call to Dr. Ammie Ferrier office, spoke with Marco Collie nurse and inquired about where patient's nephrology appointment is at, per Barnett Applebaum, appointment was for last Monday and MD office was supposed to contact pt, referral is for Dr. Lowanda Foster in [place], per Barnett Applebaum, they will do another referral and Dr. Florentina Addison office is to contact pt about the appointment but would be helpful if pt can call MD office to verify the appointment.  RN CM called patient and gave him information for Dr. Lowanda Foster- phone number and address and informed pt that MD office will contact him about appointment date and time and ask pt to call Dr. Florentina Addison office to make sure he has correct date, pt verbalizes understanding.  [name] [facility], [place] Coordinator [phone]

## 2015-12-26 ENCOUNTER — Other Ambulatory Visit: Payer: Self-pay | Admitting: Family Medicine

## 2015-12-28 DIAGNOSIS — N179 Acute kidney failure, unspecified: Secondary | ICD-10-CM | POA: Diagnosis not present

## 2015-12-28 DIAGNOSIS — I10 Essential (primary) hypertension: Secondary | ICD-10-CM | POA: Diagnosis not present

## 2015-12-28 DIAGNOSIS — R809 Proteinuria, unspecified: Secondary | ICD-10-CM | POA: Diagnosis not present

## 2015-12-28 DIAGNOSIS — R601 Generalized edema: Secondary | ICD-10-CM | POA: Diagnosis not present

## 2015-12-30 ENCOUNTER — Other Ambulatory Visit (HOSPITAL_COMMUNITY): Payer: Self-pay | Admitting: Nephrology

## 2015-12-30 DIAGNOSIS — N183 Chronic kidney disease, stage 3 unspecified: Secondary | ICD-10-CM

## 2016-01-01 ENCOUNTER — Encounter: Payer: Self-pay | Admitting: *Deleted

## 2016-01-01 ENCOUNTER — Other Ambulatory Visit: Payer: Self-pay | Admitting: *Deleted

## 2016-01-01 NOTE — Patient Outreach (Signed)
[place] [facility]) Care Management   01/01/2016  Brad Mendoza 1956/01/10 [id number]  Brad Mendoza is an 60 y.o. male  Subjective: Routine home visit with pt, HIPAA verified, pt reports he went to Dr. Lowanda Foster and has "protein in urine"  Pt to follow up in 6 weeks.  Pt reports he is weighing daily and recording, checking CBG daily and recording, still eating some foods that should not.    Objective:   Vitals:   01/01/16 1405  BP: 118/70  Pulse: 74  Resp: 16  SpO2: 94%  Weight: 197 lb 12.8 oz (89.7 kg)  CBG range 72-258 ROS  Physical Exam  Constitutional: He is oriented to person, place, and time. He appears well-developed and well-nourished.  HENT:  Head: Normocephalic.  Neck: Normal range of motion. Neck supple.  Cardiovascular: Normal rate and regular rhythm.   Respiratory: Effort normal and breath sounds normal.  GI: Soft. Bowel sounds are normal.  Musculoskeletal: Normal range of motion. He exhibits no edema.  Neurological: He is alert and oriented to person, place, and time.  Skin: Skin is warm and dry.  Psychiatric: He has a normal mood and affect. His behavior is normal. Judgment and thought content normal.    Encounter Medications:   Outpatient Encounter Prescriptions as of 01/01/2016  Medication Sig Note  . albuterol (PROVENTIL HFA;VENTOLIN HFA) 108 (90 Base) MCG/ACT inhaler Inhale 2 puffs into the lungs every 6 (six) hours as needed for wheezing or shortness of breath.   [name] amLODipine (NORVASC) 10 MG tablet Take 1 tablet (10 mg total) by mouth daily.   [name] aspirin EC 81 MG tablet Take 81 mg by mouth daily.   . Capsaicin (ICY HOT ARTHRITIS THERAPY EX) Apply 1 application topically 2 (two) times daily as needed (back pain).    . carvedilol (COREG) 12.5 MG tablet Take 1 tablet (12.5 mg total) by mouth 2 (two) times daily with a meal. 11/04/2015: Patient states that he has not been taking it twice daily because he did not realize that it was prescribed this  way.  . fluticasone (FLONASE) 50 MCG/ACT nasal spray USE 2 SPRAYS IN EACH NOSTRIL DAILY   . gabapentin (NEURONTIN) 300 MG capsule TAKE ONE (1) CAPSULE THREE (3) TIMES EACH DAY   . Insulin Detemir (LEVEMIR FLEXTOUCH) 100 UNIT/ML Pen INJECT 35 UNITS SQ TWICE DAILY WITH BREAKFAST AND SUPPER   . insulin lispro (HUMALOG KWIKPEN) 100 UNIT/ML KiwkPen INJECT UP TO 15 UNITS PRIOR TO EACH MEALAS NEEDED FOR ELAVATED BG   . isosorbide mononitrate (IMDUR) 60 MG 24 hr tablet Take 1.5 tablets (90 mg total) by mouth daily.   [name] lisinopril (PRINIVIL,ZESTRIL) 40 MG tablet Take 1 tablet (40 mg total) by mouth daily.   . Multiple Vitamins-Minerals (CENTRUM SILVER ADULT 50+ PO) Take 1 tablet by mouth daily.   . nitroGLYCERIN (NITROSTAT) 0.4 MG SL tablet DISSOLVE 1 TAB UNDER TOUNGE FOR CHEST PAIN. MAY REPEAT EVERY 5 MINUTES FOR 3 DOSES. IF NO RELIEF CALL 911 OR GO TO ER   . Omega-3 Fatty Acids (FISH OIL) 1000 MG CPDR Take 1 capsule by mouth daily.   . pantoprazole (PROTONIX) 40 MG tablet Take 1 tablet (40 mg total) by mouth daily.   . potassium chloride SA (K-DUR,KLOR-CON) 20 MEQ tablet TAKE ONE (1) TABLET EACH DAY WHEN TAKING FUROSEMIDE   . rosuvastatin (CRESTOR) 10 MG tablet Take 1 tablet (10 mg total) by mouth daily.   [name] spironolactone (ALDACTONE) 25 MG tablet Take 0.5 tablets (  12.5 mg total) by mouth daily.   . furosemide (LASIX) 40 MG tablet Take 1.5 tablets (60 mg total) by mouth daily.   . nicotine (NICODERM CQ - DOSED IN MG/24 HOURS) 14 mg/24hr patch Place 1 patch (14 mg total) onto the skin daily. (Patient not taking: Reported on 01/01/2016)    No facility-administered encounter medications on file as of 01/01/2016.     Functional Status:   In your present state of health, do you have any difficulty performing the following activities: 12/04/2015 11/24/2015  Hearing? N N  Vision? N N  Difficulty concentrating or making decisions? [name]  Walking or climbing stairs? N N  Dressing or bathing? N N  Doing  errands, shopping? N N  Preparing Food and eating ? N Y  Using the Toilet? - N  In the past six months, have you accidently leaked urine? N N  Do you have problems with loss of bowel control? N N  Managing your Medications? N Y  Managing your Finances? N Y  Housekeeping or managing your Housekeeping? N Y  Some recent data might be hidden    Fall/Depression Screening:    PHQ 2/9 Scores 12/08/2015 12/04/2015 11/24/2015 10/23/2015 10/08/2015 03/13/2015 05/14/2014  PHQ - 2 Score 0 0 0 1 1 0 1    Assessment:  RN CM talked with pt about carbohydrate modified diet and importance of good blood sugar control.  Pt logging all weights and CBG in [facility] calendar.  [facility] CM Care Plan Problem One   Flowsheet Row Most Recent Value  Care Plan Problem One  Client needs informatoin regarding community resources of assistance for client  Role Documenting the Problem One  Clinical Social Worker  Care Plan for Problem One  Active  THN Long Term Goal (31-90 days)  pt will verbalize better understanding of disease process CHF to facilitate better outcomes and avoid hospitalization within 90 days.  THN Long Term Goal Start Date  11/23/15  Interventions for Problem One Long Term Goal  RN CM praised and encouraged pt for weighing daily and recording.  THN CM Short Term Goal #1 (0-30 days)  Client will communicate with CSW in next 30 days to discuss community resourdces of assistance to client  [facility] CM Short Term Goal #1 Start Date  11/24/15  Interventions for Short Term Goal #1  Client is working on goal. [place] spoke with client on 12/15/15 about [employer]. CSW encouraged client to call that agency to request application for home repairs for client.  THN CM Short Term Goal #2 (0-30 days)  Pt will verbalize CHF zones within 30 days.  THN CM Short Term Goal #2 Start Date  01/01/16  Interventions for Short Term Goal #2  RN CM reiterated CHF zones/ action plan      Plan: see pt for home visit in  December Plan to discharge from community next month Assess CBG and weight  [name] [facility], [place] Coordinator [phone]

## 2016-01-12 ENCOUNTER — Ambulatory Visit (HOSPITAL_COMMUNITY)
Admission: RE | Admit: 2016-01-12 | Discharge: 2016-01-12 | Disposition: A | Discharge status: Home / Self Care | Payer: Medicare Other | Source: Ambulatory Visit | Attending: Nephrology | Admitting: Nephrology

## 2016-01-12 DIAGNOSIS — N183 Chronic kidney disease, stage 3 unspecified: Secondary | ICD-10-CM

## 2016-01-12 DIAGNOSIS — D509 Iron deficiency anemia, unspecified: Secondary | ICD-10-CM | POA: Diagnosis not present

## 2016-01-12 DIAGNOSIS — Z79899 Other long term (current) drug therapy: Secondary | ICD-10-CM | POA: Diagnosis not present

## 2016-01-12 DIAGNOSIS — E559 Vitamin D deficiency, unspecified: Secondary | ICD-10-CM | POA: Diagnosis not present

## 2016-01-12 DIAGNOSIS — I10 Essential (primary) hypertension: Secondary | ICD-10-CM | POA: Diagnosis not present

## 2016-01-12 DIAGNOSIS — R809 Proteinuria, unspecified: Secondary | ICD-10-CM | POA: Diagnosis not present

## 2016-01-18 ENCOUNTER — Other Ambulatory Visit: Payer: Self-pay | Admitting: Licensed Clinical Social Worker

## 2016-01-18 NOTE — Patient Outreach (Signed)
Assessment:  CSW spoke via phone with client. CSW verified client identity. CSW and client spoke of client needs. Client sees Dr. Sabra Heck as primary care doctor. Client said he has his prescribed medications and is taking medications as prescribed. CSW and client spoke of client care plan. CSW encouraged client to communicate with CSW in next 30 days to discuss community resources of assistance to client. Client drives himself to his scheduled medical appointments. Client has support from his wife and other family members. Client is receiving [facility] nursing support with RN Brad Mendoza. Client is in need of home repairs. He has phone number for [facility] [employer]. CSW and RN Brad Mendoza have both encouraged client to call [facility] [employer] to request home repair possible support. Client said he had adequate food supply. Client has scales to help him monitor his weight. He is walking adequately. He does well with activities of daily living.  CSW encouraged client to communicate regularly with [facility] RN and [facility] CSW. CSW encouraged client to call CSW at [phone] as needed to discuss social work needs of client.  CSW also gave client the name and phone number of [facility]. CSW encouraged Brad Mendoza to call [employer] and to talk with Brad Mendoza about home repair needs of client. Brad Mendoza wrote down the name and phone number for [employer]. He wrote down name of Brad Mendoza. He said he would call Brad Mendoza to speak of home repair needs of client. CSW thanked Brad Mendoza for phone call with CSW on 01/18/16.   Plan:  Client to communicate with CSW in next 30 days to discuss community resources of assistance for client.   CSW to collaborate with RN Brad Mendoza in monitoring needs of client.  CSW to call client in 4 weeks to assess client needs.  Brad Mendoza MSW, LCSW Licensed Clinical Social Worker [facility] Care Management [phone]

## 2016-01-25 DIAGNOSIS — R809 Proteinuria, unspecified: Secondary | ICD-10-CM | POA: Diagnosis not present

## 2016-01-25 DIAGNOSIS — N182 Chronic kidney disease, stage 2 (mild): Secondary | ICD-10-CM | POA: Diagnosis not present

## 2016-01-25 DIAGNOSIS — I10 Essential (primary) hypertension: Secondary | ICD-10-CM | POA: Diagnosis not present

## 2016-01-25 DIAGNOSIS — E1129 Type 2 diabetes mellitus with other diabetic kidney complication: Secondary | ICD-10-CM | POA: Diagnosis not present

## 2016-01-26 ENCOUNTER — Other Ambulatory Visit: Payer: Self-pay | Admitting: Family Medicine

## 2016-01-26 ENCOUNTER — Encounter: Payer: Self-pay | Admitting: *Deleted

## 2016-01-26 ENCOUNTER — Other Ambulatory Visit: Payer: Self-pay | Admitting: *Deleted

## 2016-01-26 NOTE — Patient Outreach (Addendum)
[facility]) Care Management   01/26/2016  Brad Mendoza March 12, 1955 [id number]  Brad Mendoza is an 60 y.o. male  Subjective: Routine home visit with pt, HIPAA verified, pt reports he went to "kidney doctor yesterday and amlodipine decreased to 12m daily" per MD note.  Pt states with his difficulty reading sometimes the medications are confusing to him and pt refuses to switch pharmacy (his pharmacy does not prefill medications). Pt states he has been eating out a lot lately and eating salty food, reports " my weight is not up much at all but my feet are swollen"   Objective:   Vitals:   01/26/16 1513  BP: 106/60  Pulse: 68  Resp: 16  SpO2: 97%  Weight: 199 lb 12.8 oz (90.6 kg)  CBG 99-323 ranges ROS  Physical Exam  Constitutional: He is oriented to person, place, and time. He appears well-developed and well-nourished.  HENT:  Head: Normocephalic.  Neck: Normal range of motion. Neck supple.  Cardiovascular: Normal rate.   Respiratory: Effort normal and breath sounds normal.  GI: Soft. Bowel sounds are normal.  Musculoskeletal: Normal range of motion. He exhibits edema.  2+ edema lower extremities bil, feet and calves  Neurological: He is alert and oriented to person, place, and time.  Skin: Skin is warm and dry.  Psychiatric: He has a normal mood and affect. His behavior is normal. Thought content normal.    Encounter Medications:   Outpatient Encounter Prescriptions as of 01/26/2016  Medication Sig Note  . albuterol (PROVENTIL HFA;VENTOLIN HFA) 108 (90 Base) MCG/ACT inhaler Inhale 2 puffs into the lungs every 6 (six) hours as needed for wheezing or shortness of breath.   .Marland KitchenamLODipine (NORVASC) 10 MG tablet Take 1 tablet (10 mg total) by mouth daily. (Patient taking differently: Take 5 mg by mouth daily. Decreased to 524mdaily on 01/25/16 by nephrology)   . aspirin EC 81 MG tablet Take 81 mg by mouth daily.   . Capsaicin (ICY HOT ARTHRITIS THERAPY EX) Apply  1 application topically 2 (two) times daily as needed (back pain).    . carvedilol (COREG) 12.5 MG tablet Take 1 tablet (12.5 mg total) by mouth 2 (two) times daily with a meal. 11/04/2015: Patient states that he has not been taking it twice daily because he did not realize that it was prescribed this way.  . fluticasone (FLONASE) 50 MCG/ACT nasal spray USE 2 SPRAYS IN EACH NOSTRIL DAILY   . gabapentin (NEURONTIN) 300 MG capsule TAKE ONE (1) CAPSULE THREE (3) TIMES EACH DAY   . Insulin Detemir (LEVEMIR FLEXTOUCH) 100 UNIT/ML Pen INJECT 35 UNITS SQ TWICE DAILY WITH BREAKFAST AND SUPPER   . insulin lispro (HUMALOG KWIKPEN) 100 UNIT/ML KiwkPen INJECT UP TO 15 UNITS PRIOR TO EACH MEALAS NEEDED FOR ELAVATED BG   . isosorbide mononitrate (IMDUR) 60 MG 24 hr tablet Take 1.5 tablets (90 mg total) by mouth daily.   . Marland Kitchenisinopril (PRINIVIL,ZESTRIL) 40 MG tablet Take 1 tablet (40 mg total) by mouth daily.   . Multiple Vitamins-Minerals (CENTRUM SILVER ADULT 50+ PO) Take 1 tablet by mouth daily.   . nitroGLYCERIN (NITROSTAT) 0.4 MG SL tablet DISSOLVE 1 TAB UNDER TOUNGE FOR CHEST PAIN. MAY REPEAT EVERY 5 MINUTES FOR 3 DOSES. IF NO RELIEF CALL 911 OR GO TO ER   . Omega-3 Fatty Acids (FISH OIL) 1000 MG CPDR Take 1 capsule by mouth daily.   . pantoprazole (PROTONIX) 40 MG tablet Take 1 tablet (40 mg total)  by mouth daily.   . potassium chloride SA (K-DUR,KLOR-CON) 20 MEQ tablet TAKE ONE (1) TABLET EACH DAY WHEN TAKING FUROSEMIDE   . rosuvastatin (CRESTOR) 10 MG tablet Take 1 tablet (10 mg total) by mouth daily.   [name] spironolactone (ALDACTONE) 25 MG tablet Take 0.5 tablets (12.5 mg total) by mouth daily.   . furosemide (LASIX) 40 MG tablet Take 1.5 tablets (60 mg total) by mouth daily.   . nicotine (NICODERM CQ - DOSED IN MG/24 HOURS) 14 mg/24hr patch Place 1 patch (14 mg total) onto the skin daily. (Patient not taking: Reported on 01/26/2016)    No facility-administered encounter medications on file as of 01/26/2016.      Functional Status:   In your present state of health, do you have any difficulty performing the following activities: 12/04/2015 11/24/2015  Hearing? N N  Vision? N N  Difficulty concentrating or making decisions? [name]  Walking or climbing stairs? N N  Dressing or bathing? N N  Doing errands, shopping? N N  Preparing Food and eating ? N Y  Using the Toilet? - N  In the past six months, have you accidently leaked urine? N N  Do you have problems with loss of bowel control? N N  Managing your Medications? N Y  Managing your Finances? N Y  Housekeeping or managing your Housekeeping? N Y  Some recent data might be hidden    Fall/Depression Screening:    PHQ 2/9 Scores 12/08/2015 12/04/2015 11/24/2015 10/23/2015 10/08/2015 03/13/2015 05/14/2014  PHQ - 2 Score 0 0 0 1 1 0 1    Assessment:  RN CM reviewed medication bottles with pt, pt has only been taking 1/2 lasix daily instead of 1 1/2 tablets as instructed, pt is taking 1/2 amount of amlodipine '5mg'$  daily as instructed by nephrologist on written note.  RN CM marked on bottle of lasix and pt verbalizes understanding to take 1 1/2 tablets,  RN CM called [facility] and inquired about prefilling patient's medications and they do not prefill and pt is adamant about not wanting to switch pharmacy.  Pt states he does not have anyone to help him with this and states he does not trust anyone to prefill med box and not sure he likes the idea, also feels it would be an issue when he has a medication change and feels this would be confusing for him. RN CM sent in basket to [facility] and reported the issue with medication and ask for any suggestions for long term prefilled med box if pt would agree.  RN CM faxed note to primary MD Dr. Sabra Heck reporting pt has not been taking lasix correctly, reported edema.  [name] CM Care Plan Problem One   Flowsheet Row Most Recent Value  Care Plan Problem One  Client needs informatoin regarding community  resources of assistance for client  Role Documenting the Problem One  Clinical Social Worker  Care Plan for Problem One  Active  THN Long Term Goal (31-90 days)  pt will verbalize better understanding of disease process CHF to facilitate better outcomes and avoid hospitalization within 90 days.  THN Long Term Goal Start Date  11/23/15  Interventions for Problem One Long Term Goal  RN CM ask pt to start weighing and recording dialy, pt has not been consistent recently  THN CM Short Term Goal #1 (0-30 days)  Client will communicate with CSW in next 30 days to discuss community resourdces of assistance to client  THN CM Short Term Goal #1 Start Date  11/24/15  [name] CM Short Term Goal #1 Met Date  01/18/16  Interventions for Short Term Goal #1  goal met.  CSW communicated with client in above time period to discuss community resources of asssistance for client.    THN CM Short Term Goal #2 (0-30 days)  Pt will verbalize CHF zones within 30 days.  THN CM Short Term Goal #2 Start Date  01/31/16  Interventions for Short Term Goal #2  RN CM reinforced CHF zones/ action plan, RN CM faxed update to primary MD Dr. Sabra Heck.      Plan: call pt this week to follow up on edema See pt for home visit in January  [name] [facility], [place] Coordinator [phone]

## 2016-01-28 ENCOUNTER — Other Ambulatory Visit: Payer: Self-pay | Admitting: *Deleted

## 2016-01-28 ENCOUNTER — Encounter: Payer: Self-pay | Admitting: Family Medicine

## 2016-01-28 ENCOUNTER — Ambulatory Visit (INDEPENDENT_AMBULATORY_CARE_PROVIDER_SITE_OTHER): Payer: Medicare Other | Admitting: Family Medicine

## 2016-01-28 VITALS — BP 135/73 | HR 72 | Temp 99.0°F | Ht 69.0 in | Wt 212.0 lb

## 2016-01-28 DIAGNOSIS — I5033 Acute on chronic diastolic (congestive) heart failure: Secondary | ICD-10-CM | POA: Diagnosis not present

## 2016-01-28 DIAGNOSIS — I10 Essential (primary) hypertension: Secondary | ICD-10-CM

## 2016-01-28 DIAGNOSIS — I509 Heart failure, unspecified: Secondary | ICD-10-CM

## 2016-01-28 DIAGNOSIS — I2510 Atherosclerotic heart disease of native coronary artery without angina pectoris: Secondary | ICD-10-CM | POA: Diagnosis not present

## 2016-01-28 NOTE — Progress Notes (Signed)
Subjective:    Patient ID: Brad Mendoza, male    DOB: [birth year], 60 y.o.   MRN: [id number]  HPI 60 year old gentleman who has had compliance issues with medicines and subsequent exacerbations of congestive heart failure with hospitalization. Most recently he was visited by home health nurse and found that he was not taking his diuretic and blood pressure pills as directed. Consequently he had some dependent edema but this did not translate to heart failure shortness of breath symptoms.  Patient Active Problem List   Diagnosis Date Noted  . CHF exacerbation ([place]) 11/17/2015  . Acute on chronic combined systolic and diastolic CHF (congestive heart failure) ([place]) 11/16/2015  . Acute left eye pain   . Hyperglycemia 05/08/2015  . Type 2 diabetes mellitus with hyperosmolar nonketotic hyperglycemia ([place]) 05/08/2015  . AKI (acute kidney injury) ([place]) 05/08/2015  . Hyponatremia 05/08/2015  . Hypochloremia 05/08/2015  . Type 2 diabetes mellitus with diabetic polyneuropathy ([name]) 12/16/2013  . CAD (coronary artery disease) 09/18/2013  . Heme positive stool 06/12/2013  . Change in stool 06/12/2013  . Seasonal allergic rhinitis 07/11/2012  . Polysubstance abuse 12/24/2010  . Bronchitis 12/24/2010  . Onychomycosis   . Acid reflux   . Nonischemic cardiomyopathy ([place])   . Type 2 diabetes mellitus with complications ([place])   . Hyperlipemia 11/16/2009  . Obesity 11/16/2009  . Tobacco dependence 11/16/2009  . Essential hypertension 11/16/2009  . CHEST PAIN UNSPECIFIED 11/16/2009   Outpatient Encounter Prescriptions as of 01/28/2016  Medication Sig  . albuterol (PROVENTIL HFA;VENTOLIN HFA) 108 (90 Base) MCG/ACT inhaler Inhale 2 puffs into the lungs every 6 (six) hours as needed for wheezing or shortness of breath.  [name] amLODipine (NORVASC) 10 MG tablet Take 1 tablet (10 mg total) by mouth daily. (Patient taking differently: Take 5 mg by mouth daily. Decreased to 5mg  daily on 01/25/16 by nephrology)    . aspirin EC 81 MG tablet Take 81 mg by mouth daily.  . Capsaicin (ICY HOT ARTHRITIS THERAPY EX) Apply 1 application topically 2 (two) times daily as needed (back pain).   . carvedilol (COREG) 12.5 MG tablet Take 1 tablet (12.5 mg total) by mouth 2 (two) times daily with a meal.  . fluticasone (FLONASE) 50 MCG/ACT nasal spray USE 2 SPRAYS IN EACH NOSTRIL DAILY  . gabapentin (NEURONTIN) 300 MG capsule TAKE ONE (1) CAPSULE THREE (3) TIMES EACH DAY  . insulin lispro (HUMALOG KWIKPEN) 100 UNIT/ML KiwkPen INJECT UP TO 15 UNITS PRIOR TO EACH MEALAS NEEDED FOR ELAVATED BG  . isosorbide mononitrate (IMDUR) 60 MG 24 hr tablet Take 1.5 tablets (90 mg total) by mouth daily.  [name] LEVEMIR FLEXTOUCH 100 UNIT/ML Pen INJECT 35 UNITS SQ TWICE DAILY WITH BREAKFAST AND SUPPER  . lisinopril (PRINIVIL,ZESTRIL) 40 MG tablet Take 1 tablet (40 mg total) by mouth daily.  . Multiple Vitamins-Minerals (CENTRUM SILVER ADULT 50+ PO) Take 1 tablet by mouth daily.  . nicotine (NICODERM CQ - DOSED IN MG/24 HOURS) 14 mg/24hr patch Place 1 patch (14 mg total) onto the skin daily.  . nitroGLYCERIN (NITROSTAT) 0.4 MG SL tablet DISSOLVE 1 TAB UNDER TOUNGE FOR CHEST PAIN. MAY REPEAT EVERY 5 MINUTES FOR 3 DOSES. IF NO RELIEF CALL 911 OR GO TO ER  . Omega-3 Fatty Acids (FISH OIL) 1000 MG CPDR Take 1 capsule by mouth daily.  . pantoprazole (PROTONIX) 40 MG tablet Take 1 tablet (40 mg total) by mouth daily.  . potassium chloride SA (K-DUR,KLOR-CON) 20 MEQ tablet TAKE ONE (1) TABLET  EACH DAY WHEN TAKING FUROSEMIDE  . rosuvastatin (CRESTOR) 10 MG tablet Take 1 tablet (10 mg total) by mouth daily.  [name] spironolactone (ALDACTONE) 25 MG tablet Take 0.5 tablets (12.5 mg total) by mouth daily.  . furosemide (LASIX) 40 MG tablet Take 1.5 tablets (60 mg total) by mouth daily.   No facility-administered encounter medications on file as of 01/28/2016.       Review of Systems  Constitutional: Negative.   Respiratory: Negative.   Cardiovascular:  Positive for leg swelling.  Neurological: Negative.   Psychiatric/Behavioral: Negative.        Objective:   Physical Exam  Constitutional: He is oriented to person, place, and time. He appears well-developed and well-nourished.  Cardiovascular: Normal rate, regular rhythm and normal heart sounds.   Pulmonary/Chest: Effort normal and breath sounds normal. No respiratory distress.  Musculoskeletal: Edema:  edema in ankles and feet.  Neurological: He is alert and oriented to person, place, and time.   BP 135/73   Pulse 72   Temp 99 F (37.2 C) (Oral)   Ht 5\' 9"  (1.753 m)   Wt 212 lb (96.2 kg)   BMI 31.31 kg/m         Assessment & Plan:  1. Essential hypertension Blood pressure is well controlled on current regimen   2. Acute on chronic diastolic congestive heart failure (HCC) Heart failure is well compensated at this time. Patient needs help with organization of his multiple medicines. As we spent some time in the office today sorting out his pills and helping him organize that if the pharmacy can package his pills and a bubble wrapped make it even more clear of think that could be very helpful and prevent some of his recurrent hospitalizations  [name] MD

## 2016-01-28 NOTE — Patient Outreach (Signed)
Telephone call to pt for follow up, pt reports he is now taking 1.5 lasix tablets daily, weight is 205 pounds, still has edema, RN CM discussed once again switching to pharmacy that will prefill medications, pt is hesitant but now agrees due to pt continues having difficulty managing medications at home.  RN CM sent order for pharmacy to contact pt.  RN CM ask pt if would be willing to see MD today if RN CM can get appointment, pt agreeable, RN CM called Dr. Palma Holter office, spoke with Lattie Haw, reported pt still has edema and weight has increased. MD can see pt today at 3pm, pt states he will attend appointment.  PLAN Call pt beginning of week to assess edema, weight, follow up MD appointment  [name] [facility], [place] Coordinator [phone]

## 2016-02-01 ENCOUNTER — Other Ambulatory Visit: Payer: Self-pay | Admitting: *Deleted

## 2016-02-01 NOTE — Patient Outreach (Signed)
Telephone call to patient for follow up on weight and edema, spoke with pt, HIPAA verified, per pt, weight today 198 pounds and "swelling better"  Pt reports being asymptomatic, no new issues or concerns voiced.  [name] [facility], [place] Coordinator [phone]

## 2016-02-03 ENCOUNTER — Ambulatory Visit (HOSPITAL_COMMUNITY)
Admission: RE | Admit: 2016-02-03 | Discharge: 2016-02-03 | Disposition: A | Discharge status: Home / Self Care | Payer: Medicare Other | Source: Ambulatory Visit | Attending: Adult Health | Admitting: Adult Health

## 2016-02-03 ENCOUNTER — Ambulatory Visit (HOSPITAL_COMMUNITY): Admission: RE | Admit: 2016-02-03 | Payer: Medicare Other | Source: Ambulatory Visit

## 2016-02-03 DIAGNOSIS — Z720 Tobacco use: Secondary | ICD-10-CM | POA: Diagnosis not present

## 2016-02-03 DIAGNOSIS — I2510 Atherosclerotic heart disease of native coronary artery without angina pectoris: Secondary | ICD-10-CM | POA: Insufficient documentation

## 2016-02-03 DIAGNOSIS — I110 Hypertensive heart disease with heart failure: Secondary | ICD-10-CM | POA: Diagnosis not present

## 2016-02-03 DIAGNOSIS — E785 Hyperlipidemia, unspecified: Secondary | ICD-10-CM | POA: Insufficient documentation

## 2016-02-03 DIAGNOSIS — I340 Nonrheumatic mitral (valve) insufficiency: Secondary | ICD-10-CM | POA: Diagnosis not present

## 2016-02-03 DIAGNOSIS — I5042 Chronic combined systolic (congestive) and diastolic (congestive) heart failure: Secondary | ICD-10-CM | POA: Insufficient documentation

## 2016-02-03 DIAGNOSIS — I071 Rheumatic tricuspid insufficiency: Secondary | ICD-10-CM | POA: Diagnosis not present

## 2016-02-03 DIAGNOSIS — E119 Type 2 diabetes mellitus without complications: Secondary | ICD-10-CM | POA: Diagnosis not present

## 2016-02-03 LAB — ECHOCARDIOGRAM COMPLETE
AVLVOTPG: 3 mmHg
CHL CUP RV SYS PRESS: 42 mmHg
CHL CUP STROKE VOLUME: 45 mL
E/e' ratio: 25.96
EWDT: 165 ms
FS: 15 % — AB (ref 28–44)
IVS/LV PW RATIO, ED: 0.84
LA ID, A-P, ES: 48 mm
LA vol A4C: 127 ml
LA vol index: 47.4 mL/m2
LADIAMINDEX: 2.19 cm/m2
LAVOL: 104 mL
LEFT ATRIUM END SYS DIAM: 48 mm
LV E/e' medial: 25.96
LV E/e'average: 25.96
LV SIMPSON'S DISK: 37
LV TDI E'LATERAL: 4.7
LV TDI E'MEDIAL: 5.11
LV dias vol index: 55 mL/m2
LV dias vol: 121 mL (ref 62–150)
LV e' LATERAL: 4.7 cm/s
LVOT SV: 66 mL
LVOT VTI: 20.9 cm
LVOT area: 3.14 cm2
LVOT peak vel: 85.3 cm/s
LVOTD: 20 mm
LVSYSVOL: 76 mL — AB (ref 21–61)
LVSYSVOLIN: 35 mL/m2
MV Dec: 165
MV Peak grad: 6 mmHg
MVPKAVEL: 68.9 m/s
MVPKEVEL: 122 m/s
PW: 11.1 mm — AB (ref 0.6–1.1)
RV LATERAL S' VELOCITY: 12.2 cm/s
RV TAPSE: 19.3 mm
Reg peak vel: 291 cm/s
TR max vel: 291 cm/s

## 2016-02-03 NOTE — Progress Notes (Signed)
*  PRELIMINARY RESULTS* Echocardiogram 2D Echocardiogram has been performed.  Brad Mendoza 02/03/2016, 2:39 PM

## 2016-02-04 ENCOUNTER — Ambulatory Visit (INDEPENDENT_AMBULATORY_CARE_PROVIDER_SITE_OTHER): Payer: Medicare Other | Admitting: Cardiovascular Disease

## 2016-02-04 ENCOUNTER — Encounter: Payer: Self-pay | Admitting: Cardiovascular Disease

## 2016-02-04 VITALS — BP 106/64 | HR 75 | Ht 69.0 in | Wt 207.0 lb

## 2016-02-04 DIAGNOSIS — I5042 Chronic combined systolic (congestive) and diastolic (congestive) heart failure: Secondary | ICD-10-CM | POA: Diagnosis not present

## 2016-02-04 DIAGNOSIS — E7800 Pure hypercholesterolemia, unspecified: Secondary | ICD-10-CM | POA: Diagnosis not present

## 2016-02-04 DIAGNOSIS — I10 Essential (primary) hypertension: Secondary | ICD-10-CM | POA: Diagnosis not present

## 2016-02-04 DIAGNOSIS — I2510 Atherosclerotic heart disease of native coronary artery without angina pectoris: Secondary | ICD-10-CM | POA: Diagnosis not present

## 2016-02-04 DIAGNOSIS — I428 Other cardiomyopathies: Secondary | ICD-10-CM

## 2016-02-04 DIAGNOSIS — R42 Dizziness and giddiness: Secondary | ICD-10-CM | POA: Diagnosis not present

## 2016-02-04 DIAGNOSIS — Z79899 Other long term (current) drug therapy: Secondary | ICD-10-CM

## 2016-02-04 DIAGNOSIS — I209 Angina pectoris, unspecified: Secondary | ICD-10-CM | POA: Diagnosis not present

## 2016-02-04 DIAGNOSIS — I25119 Atherosclerotic heart disease of native coronary artery with unspecified angina pectoris: Secondary | ICD-10-CM

## 2016-02-04 MED ORDER — AMLODIPINE BESYLATE 5 MG PO TABS: 5.0000 mg | ORAL_TABLET | Freq: Every day | ORAL | 3 refills | Status: DC

## 2016-02-04 NOTE — Progress Notes (Signed)
SUBJECTIVE: The patient presents for follow-up of chronic combined systolic and diastolic heart failure. He has a nonischemic cardiomyopathy. He has had issues with medication compliance.  Echocardiogram 02/03/16: moderately reduced left ventricular systolic function, LVEF 95-09%, grade 2 diastolic dysfunction with elevated filling pressures, severe left atrial dilatation, pulmonary pressures 42 mmHg.  He occasionally has chest "twinges" lasting seconds. He has some mild exertional dyspnea which is stable. He had been having leg swelling but was not taking his medications appropriately. This resolved after he was educated on this. He complains of dizziness this morning. Blood pressure 106/64. He tries to eat low-sodium foods.    Review of Systems: As per "subjective", otherwise negative.  Allergies  Allergen Reactions  . Sulfa Antibiotics Shortness Of Breath  . Penicillins Rash    Has patient had a PCN reaction causing immediate rash, facial/tongue/throat swelling, SOB or lightheadedness with hypotension: No Has patient had a PCN reaction causing severe rash involving mucus membranes or skin necrosis: No Has patient had a PCN reaction that required hospitalization No Has patient had a PCN reaction occurring within the last 10 years: No If all of the above answers are "NO", then may proceed with Cephalosporin use.     Current Outpatient Prescriptions  Medication Sig Dispense Refill  . albuterol (PROVENTIL HFA;VENTOLIN HFA) 108 (90 Base) MCG/ACT inhaler Inhale 2 puffs into the lungs every 6 (six) hours as needed for wheezing or shortness of breath. 1 Inhaler 2  . amLODipine (NORVASC) 10 MG tablet Take 1 tablet (10 mg total) by mouth daily. (Patient taking differently: Take 5 mg by mouth daily. Decreased to 5mg  daily on 01/25/16 by nephrology) 30 tablet 6  . aspirin EC 81 MG tablet Take 81 mg by mouth daily.    . Capsaicin (ICY HOT ARTHRITIS THERAPY EX) Apply 1 application topically  2 (two) times daily as needed (back pain).     . carvedilol (COREG) 12.5 MG tablet Take 1 tablet (12.5 mg total) by mouth 2 (two) times daily with a meal. 60 tablet 6  . fluticasone (FLONASE) 50 MCG/ACT nasal spray USE 2 SPRAYS IN EACH NOSTRIL DAILY 16 g 2  . gabapentin (NEURONTIN) 300 MG capsule TAKE ONE (1) CAPSULE THREE (3) TIMES EACH DAY 90 capsule 1  . insulin lispro (HUMALOG KWIKPEN) 100 UNIT/ML KiwkPen INJECT UP TO 15 UNITS PRIOR TO EACH MEALAS NEEDED FOR ELAVATED BG 15 mL 0  . isosorbide mononitrate (IMDUR) 60 MG 24 hr tablet Take 1.5 tablets (90 mg total) by mouth daily. 45 tablet 6  . LEVEMIR FLEXTOUCH 100 UNIT/ML Pen INJECT 35 UNITS SQ TWICE DAILY WITH BREAKFAST AND SUPPER 30 mL 1  . lisinopril (PRINIVIL,ZESTRIL) 40 MG tablet Take 1 tablet (40 mg total) by mouth daily. 30 tablet 5  . Multiple Vitamins-Minerals (CENTRUM SILVER ADULT 50+ PO) Take 1 tablet by mouth daily.    . nicotine (NICODERM CQ - DOSED IN MG/24 HOURS) 14 mg/24hr patch Place 1 patch (14 mg total) onto the skin daily. 28 patch 0  . nitroGLYCERIN (NITROSTAT) 0.4 MG SL tablet DISSOLVE 1 TAB UNDER TOUNGE FOR CHEST PAIN. MAY REPEAT EVERY 5 MINUTES FOR 3 DOSES. IF NO RELIEF CALL 911 OR GO TO ER 25 tablet 2  . Omega-3 Fatty Acids (FISH OIL) 1000 MG CPDR Take 1 capsule by mouth daily.    . pantoprazole (PROTONIX) 40 MG tablet Take 1 tablet (40 mg total) by mouth daily. 30 tablet 3  . potassium chloride SA (K-DUR,KLOR-CON) 20  MEQ tablet TAKE ONE (1) TABLET EACH DAY WHEN TAKING FUROSEMIDE 30 tablet 5  . rosuvastatin (CRESTOR) 10 MG tablet Take 1 tablet (10 mg total) by mouth daily. 30 tablet 6  . spironolactone (ALDACTONE) 25 MG tablet Take 0.5 tablets (12.5 mg total) by mouth daily. 30 tablet 1  . furosemide (LASIX) 40 MG tablet Take 1.5 tablets (60 mg total) by mouth daily. 45 tablet 6   No current facility-administered medications for this visit.     Past Medical History:  Diagnosis Date  . Acid reflux   . Back pain     f4 and f5  . CHF (congestive heart failure) ([place])    last echo was in March 2016  . Diabetes mellitus 2005  . Diverticulosis   . H/O chest pain 2005   ECHO  . HTN (hypertension)   . Hyperlipidemia   . Onychomycosis     Past Surgical History:  Procedure Laterality Date  . APPENDECTOMY    . CARDIAC CATHETERIZATION  2005   4 frech cath  . COLONOSCOPY N/A 08/01/2013   Procedure: COLONOSCOPY;  Surgeon: Rogene Houston, MD;  Location: [facility];  Service: Endoscopy;  Laterality: N/A;  rescheduled to [place] notified pt  . ESOPHAGOGASTRODUODENOSCOPY N/A 04/23/2015   Procedure: ESOPHAGOGASTRODUODENOSCOPY (EGD);  Surgeon: Rogene Houston, MD;  Location: [facility];  Service: Endoscopy;  Laterality: N/A;  1:25    Social History   Social History  . Marital status: Married    Spouse name: N/A  . Number of children: N/A  . Years of education: N/A   Occupational History  . unemployed    Social History Main Topics  . Smoking status: Never Smoker  . Smokeless tobacco: Current User    Types: Snuff     Comment: dips snuff about 6 cans/week for 3 years  . Alcohol use 0.6 oz/week    1 Cans of beer per week     Comment: occ   . Drug use:     Types: Marijuana     Comment: last use 3-4 days ago  . Sexual activity: Yes   Other Topics Concern  . Not on file   Social History Narrative  . No narrative on file     Vitals:   02/04/16 1130  BP: 106/64  Pulse: 75  SpO2: 98%  Weight: 207 lb (93.9 kg)  Height: 5\' 9"  (1.753 m)    PHYSICAL EXAM General: NAD HEENT: Normal. Neck: No JVD, no thyromegaly. Lungs: Clear to auscultation bilaterally with normal respiratory effort. CV: Nondisplaced PMI.  Regular rate and rhythm, normal S1/S2, no S3/S4, no murmur. No pretibial or periankle edema.  No carotid bruit.   Abdomen: Soft, nontender, no distention.  Neurologic: Alert and oriented.  Psych: Normal affect. Skin: Normal. Musculoskeletal: No gross deformities.    ECG: Most  recent ECG reviewed.      ASSESSMENT AND PLAN: 1. Chronic combined systolic and diastolic heart failure: Euvolemic. Continue Lasix, Coreg, lisinopril, nitrates, and spironolactone at present doses.  2. Essential HTN with dizziness: Low normal. Due to dizziness, will reduce amlodipine to 5 mg.  3. Hyperlipidemia: Continue Crestor.  4. CAD (50% prior LAD lesion):Symptomatically stable. Previous stress test showed only anterior and anteroseptal wall infarct without ischemia. Continue ASA, Coreg, nitrates, and Crestor.  Dispo: fu 4 months   Kate Sable, M.D., F.A.C.C.

## 2016-02-04 NOTE — Patient Instructions (Signed)
Your physician wants you to follow-up in: 4 months Dr Virgina Jock will receive a reminder letter in the mail two months in advance. If you don't receive a letter, please call our office to schedule the follow-up appointment.     DECREASE Norvasc to 5 mg daily        Thank you for choosing [facility] !

## 2016-02-12 ENCOUNTER — Other Ambulatory Visit: Payer: Self-pay | Admitting: Family Medicine

## 2016-02-17 DIAGNOSIS — E1121 Type 2 diabetes mellitus with diabetic nephropathy: Secondary | ICD-10-CM | POA: Diagnosis not present

## 2016-02-17 DIAGNOSIS — R809 Proteinuria, unspecified: Secondary | ICD-10-CM | POA: Diagnosis not present

## 2016-02-18 ENCOUNTER — Other Ambulatory Visit: Payer: Self-pay | Admitting: Licensed Clinical Social Worker

## 2016-02-18 NOTE — Patient Outreach (Signed)
Assessment:  CSW spoke via phone with client. CSW verified client identity. CSW and client spoke of client needs. Client sees Dr. Alain Honey as primary care doctor. Client said he had his prescribed medications and is taking medications as prescribed. CSW and client spoke of client care plan. CSW encouraged client to communicate with CSW in next 30 days to discuss community resources of assistance for client. Client drives himself to scheduled client medical appointments.  Client has support from his wife and other family members.  Client has been receiving [facility] nursing support with RN [name].  Client and CSW have communicated about home repair needs of client. CSW had provided client with phone number for [facility] [employer]. CSW had encouraged client to call that organization to speak with representative of organization about home repair needs of client.  Client reported that he has adequate food supply. He has scales to use to monitor weight of client. Client said he is doing well with activities of daily living. Client said that client sees cardiologist as scheduled.  CSW had provided client with name and phone number of [facility]. CSW had encouraged client to call Karalee Height at that agency to talk with  [name] about home repair needs of client.  Client said he called Karalee Height at agency recently and [name] gave client the name and phone number of a [place]  representative  who comes to [facility] monthly to look at home assistance/repairs for qualified applicants.  Client said he has appointment with Housing Authority representative on 03/01/16 in [place], [place].  Client said he is in process of completing an application for home repair assistance with [employer] mentioned above.  Client said he has a friend (neighbor) who also helps him with transport needs of client.  He did not mention any pain issues. He said he is  eating well.  He said again that he did well with activities of daily living.  He said he is sleeping well.  He said he had appointment in December of 2017 with Dr. Sabra Heck. Client said he had biopsy on his kidney recently and is waiting on biopsy results. He said he had biopsy on kidney completed at [place] encouraged client to call CSW at [phone] as needed to discuss social work needs of client.  Client spoke also of [facility] nursing support he is receiving with RN [name]. Client was appreciative of phone call from [place] on 02/18/16.   Plan:  Client to communicate with CSW in next 30 days to discuss community resources of assistance for client.     CSW to collaborate with RN [name] as needed in monitoring needs of client.  CSW to call client in 4 weeks to assess client needs at that time.  [name].Delina Kruczek MSW, LCSW Licensed Clinical Social Worker [facility] Care Management [phone]

## 2016-02-23 ENCOUNTER — Other Ambulatory Visit: Payer: Self-pay | Admitting: *Deleted

## 2016-02-23 DIAGNOSIS — N060 Isolated proteinuria with minor glomerular abnormality: Secondary | ICD-10-CM | POA: Diagnosis not present

## 2016-02-23 DIAGNOSIS — F1911 Other psychoactive substance abuse, in remission: Secondary | ICD-10-CM | POA: Diagnosis not present

## 2016-02-23 DIAGNOSIS — N261 Atrophy of kidney (terminal): Secondary | ICD-10-CM | POA: Diagnosis not present

## 2016-02-23 DIAGNOSIS — N189 Chronic kidney disease, unspecified: Secondary | ICD-10-CM | POA: Diagnosis not present

## 2016-02-23 DIAGNOSIS — F17200 Nicotine dependence, unspecified, uncomplicated: Secondary | ICD-10-CM | POA: Diagnosis not present

## 2016-02-23 DIAGNOSIS — E1121 Type 2 diabetes mellitus with diabetic nephropathy: Secondary | ICD-10-CM | POA: Diagnosis not present

## 2016-02-23 DIAGNOSIS — E1122 Type 2 diabetes mellitus with diabetic chronic kidney disease: Secondary | ICD-10-CM | POA: Diagnosis not present

## 2016-02-23 DIAGNOSIS — N269 Renal sclerosis, unspecified: Secondary | ICD-10-CM | POA: Diagnosis not present

## 2016-02-23 DIAGNOSIS — I129 Hypertensive chronic kidney disease with stage 1 through stage 4 chronic kidney disease, or unspecified chronic kidney disease: Secondary | ICD-10-CM | POA: Diagnosis not present

## 2016-02-23 NOTE — Patient Outreach (Signed)
RN CM drove to patient's home for schedule home visit, no answer to door, RN CM called pt and spoke with pt who reports he is at a funeral and will not return home until later this afternoon.  PLAN RN CM to call and reschedule home visit or telephonic assessment  [name] [facility], [place] Coordinator [phone]

## 2016-02-24 ENCOUNTER — Other Ambulatory Visit: Payer: Self-pay | Admitting: *Deleted

## 2016-02-24 NOTE — Patient Outreach (Signed)
Telephone call to patient to reschedule home visit, no answer to telephone and received message stating " voicemail not set up"  PLAN Try to reach pt at later date.  [name] [facility], BSN [facility] Coordinator [phone]  f

## 2016-02-25 ENCOUNTER — Other Ambulatory Visit (HOSPITAL_COMMUNITY): Payer: Medicare Other

## 2016-02-26 ENCOUNTER — Other Ambulatory Visit: Payer: Self-pay | Admitting: *Deleted

## 2016-02-26 NOTE — Patient Outreach (Signed)
Telephone call to pt to reschedule home visit, spoke with pt, HIPAA verified, visit rescheduled for 03/04/16 to accommodate  patient's schedule.  [name] [facility], [place] Coordinator [phone]

## 2016-02-29 ENCOUNTER — Ambulatory Visit: Payer: Medicare Other | Admitting: Adult Health

## 2016-02-29 ENCOUNTER — Other Ambulatory Visit: Payer: Self-pay | Admitting: Pharmacist

## 2016-02-29 NOTE — Patient Outreach (Signed)
[facility] [facility]) Care Management  [place]   02/29/2016  [name] 1955-12-12 [id number]  Subjective:  [name] is a 61 year old male referred for medication assistance(specifically about pill packaging) by [name], RN.  [name] was called, HIPAA verified.  Patient has a past medication history significant for but not limited to:  Uncontrolled type 2 diabetes, hypertension, hyperlipidemia, congested heart failure, coronary artery disease, angina and seasonal allergies.  A great deal of time was spent talking to [name] about pill packaging.  [name] uses [facility]" in [place], [place]. Unfortunately, [facility] does not provide pill packaging services.  [facility], [employer] and [employer] are all within a fifteen mile radius to [name] and provide pill packaging at no additional cost.    [place] was called and the pill packaging technician confirmed that [name] medications could be provided in pill packs and delivered to his home.  After hearing the various options for pill packaging, [name] said that he would prefer to stay with [facility] in [place] but would call me back or let his nurse care manager, [name], know if he wanted to begin receiving pill packaging for his medications.  A medication review was completed with [name] via telephone.  Patient declined [facility] pharmacy services in his home at this time.  Objective:   Current Medications: Current Outpatient Prescriptions  Medication Sig Dispense Refill  . albuterol (PROVENTIL HFA;VENTOLIN HFA) 108 (90 Base) MCG/ACT inhaler Inhale 2 puffs into the lungs every 6 (six) hours as needed for wheezing or shortness of breath. 1 Inhaler 2  . amLODipine (NORVASC) 5 MG tablet Take 1 tablet (5 mg total) by mouth daily. 90 tablet 3  . aspirin EC 81 MG tablet Take 81 mg by mouth daily.    . Capsaicin (ICY HOT ARTHRITIS THERAPY EX)  Apply 1 application topically 2 (two) times daily as needed (back pain).     . carvedilol (COREG) 12.5 MG tablet Take 1 tablet (12.5 mg total) by mouth 2 (two) times daily with a meal. 60 tablet 6  . fluticasone (FLONASE) 50 MCG/ACT nasal spray USE 2 SPRAYS IN EACH NOSTRIL DAILY 16 g 2  . furosemide (LASIX) 40 MG tablet Take 1.5 tablets (60 mg total) by mouth daily. 45 tablet 6  . gabapentin (NEURONTIN) 300 MG capsule TAKE ONE (1) CAPSULE THREE (3) TIMES EACH DAY 90 capsule 1  . insulin lispro (HUMALOG KWIKPEN) 100 UNIT/ML KiwkPen INJECT UP TO 15 UNITS PRIOR TO EACH MEALAS NEEDED FOR ELAVATED BG 15 mL 0  . isosorbide mononitrate (IMDUR) 60 MG 24 hr tablet Take 1.5 tablets (90 mg total) by mouth daily. 45 tablet 6  . LEVEMIR FLEXTOUCH 100 UNIT/ML Pen INJECT 35 UNITS SQ TWICE DAILY WITH BREAKFAST AND SUPPER 30 mL 1  . lisinopril (PRINIVIL,ZESTRIL) 40 MG tablet Take 1 tablet (40 mg total) by mouth daily. 30 tablet 5  . Multiple Vitamins-Minerals (CENTRUM SILVER ADULT 50+ PO) Take 1 tablet by mouth daily.    . nicotine (NICODERM CQ - DOSED IN MG/24 HOURS) 14 mg/24hr patch Place 1 patch (14 mg total) onto the skin daily. 28 patch 0  . nitroGLYCERIN (NITROSTAT) 0.4 MG SL tablet DISSOLVE 1 TAB UNDER TOUNGE FOR CHEST PAIN. MAY REPEAT EVERY 5 MINUTES FOR 3 DOSES. IF NO RELIEF CALL 911 OR GO TO ER 25 tablet 2  . Omega-3 Fatty Acids (FISH OIL) 1000 MG CPDR  Take 1 capsule by mouth daily.    . pantoprazole (PROTONIX) 40 MG tablet Take 1 tablet (40 mg total) by mouth daily. 30 tablet 3  . potassium chloride SA (K-DUR,KLOR-CON) 20 MEQ tablet TAKE ONE (1) TABLET EACH DAY WHEN TAKING FUROSEMIDE 30 tablet 5  . rosuvastatin (CRESTOR) 10 MG tablet Take 1 tablet (10 mg total) by mouth daily. 30 tablet 6  . spironolactone (ALDACTONE) 25 MG tablet Take 0.5 tablets (12.5 mg total) by mouth daily. 30 tablet 1   No current facility-administered medications for this visit.     Functional Status: In your present state of  health, do you have any difficulty performing the following activities: 12/04/2015 11/24/2015  Hearing? N N  Vision? N N  Difficulty concentrating or making decisions? [name]  Walking or climbing stairs? N N  Dressing or bathing? N N  Doing errands, shopping? N N  Preparing Food and eating ? N Y  Using the Toilet? - N  In the past six months, have you accidently leaked urine? N N  Do you have problems with loss of bowel control? N N  Managing your Medications? N Y  Managing your Finances? N Y  Housekeeping or managing your Housekeeping? N Y  Some recent data might be hidden    Fall/Depression Screening: PHQ 2/9 Scores 01/28/2016 12/08/2015 12/04/2015 11/24/2015 10/23/2015 10/08/2015 03/13/2015  PHQ - 2 Score 0 0 0 0 1 1 0    Assessment:  Drugs sorted by system:  Neurologic/Psychologic: Gabapentin  Cardiovascular:   Amlodipine, Aspirin, carvedilol, furosemide, lisinopril, Isosorbide mononitrate, Nitroglycerin sublingual, Omega 3 fatty acids, Spironolactone, rosuvastatin  Pulmonary/Allergy: Fluticasone, Albuterol HFA  Gastrointestinal: Pantoprazole  Endocrine:  Levemir Humalog ---patient reports not taking Humalog  Topical/Pain: Capsaicin  Vitamins/Minerals: Multiple Vitamin Potassium chloride   Miscellaneous: Nicotine Patches (patient reports not using these)  Gaps in therapy:   Adherence-Humlaog [name] reports that he was instructed to stop using Humalog by his provider, but a review of his most recent visit notes do not reflect any documentation about discontinuing Humalog.  His HgA1c in October of 2017 was 13.1%.    Other issues noted:  [name] reports that he does not take gabapentin regularly because it causes him to feel "dizzy and off balance".  Plan:  Will route note to PCP.   Will close pharmacy case as patient decline further [facility] services.  Maish Vaya is available should new pharmacy needs arise.   [facility] PharmD Alwyn Ren, completed patient  outreach, she does not have [facility] access yet so I am assisting her with her documentation.    Karrie Meres, PharmD, [place] [phone]

## 2016-03-04 ENCOUNTER — Other Ambulatory Visit: Payer: Self-pay | Admitting: *Deleted

## 2016-03-04 ENCOUNTER — Encounter: Payer: Self-pay | Admitting: *Deleted

## 2016-03-04 NOTE — Patient Outreach (Addendum)
[facility]) Care Management   03/04/2016  Brad Mendoza 28-Oct-1955 [id number]  Brad Mendoza is an 61 y.o. male  Subjective: Routine home visit with pt, HIPAA verified, pt reports he has been weighing daily but not recording, blood sugar in low 200's range most of time per pt.  Pt states he had kidney biopsy but does not have results yet.  Pt reports " my fluid is so much better"  Objective:   Vitals:   03/04/16 1321  Pulse: 74  Resp: 18  SpO2: 98%  Weight: 193 lb (87.5 kg)  CBG in low 200's range ROS  Physical Exam  Constitutional: He is oriented to person, place, and time. He appears well-developed and well-nourished.  HENT:  Head: Normocephalic.  Neck: Normal range of motion. Neck supple.  Cardiovascular: Normal rate.   Respiratory: Effort normal and breath sounds normal.  GI: Soft. Bowel sounds are normal.  Musculoskeletal: Normal range of motion. He exhibits no edema.  Neurological: He is alert and oriented to person, place, and time.  Skin: Skin is warm and dry.  Psychiatric: He has a normal mood and affect. His behavior is normal. Judgment and thought content normal.    Encounter Medications:   Outpatient Encounter Prescriptions as of 03/04/2016  Medication Sig Note  . albuterol (PROVENTIL HFA;VENTOLIN HFA) 108 (90 Base) MCG/ACT inhaler Inhale 2 puffs into the lungs every 6 (six) hours as needed for wheezing or shortness of breath. 02/29/2016: Uses PRN  . amLODipine (NORVASC) 5 MG tablet Take 1 tablet (5 mg total) by mouth daily.   [name] aspirin EC 81 MG tablet Take 81 mg by mouth daily.   . Capsaicin (ICY HOT ARTHRITIS THERAPY EX) Apply 1 application topically 2 (two) times daily as needed (back pain).    . carvedilol (COREG) 12.5 MG tablet Take 1 tablet (12.5 mg total) by mouth 2 (two) times daily with a meal.   . fluticasone (FLONASE) 50 MCG/ACT nasal spray USE 2 SPRAYS IN EACH NOSTRIL DAILY   . furosemide (LASIX) 40 MG tablet Take 1.5 tablets (60 mg  total) by mouth daily.   [name] gabapentin (NEURONTIN) 300 MG capsule TAKE ONE (1) CAPSULE THREE (3) TIMES EACH DAY (Patient not taking: Reported on 02/29/2016) 02/29/2016: Patient reports that he does not like the way gabapentin makes him feel ( dizzy/off balance)--will only take on PRN basis if not leaving the home  . insulin lispro (HUMALOG KWIKPEN) 100 UNIT/ML KiwkPen INJECT UP TO 15 UNITS PRIOR TO EACH MEALAS NEEDED FOR ELAVATED BG (Patient not taking: Reported on 02/29/2016) 02/29/2016: Patient reports that he was instructed to stop using Humalog  . isosorbide mononitrate (IMDUR) 60 MG 24 hr tablet Take 1.5 tablets (90 mg total) by mouth daily.   [name] LEVEMIR FLEXTOUCH 100 UNIT/ML Pen INJECT 35 UNITS SQ TWICE DAILY WITH BREAKFAST AND SUPPER   . lisinopril (PRINIVIL,ZESTRIL) 40 MG tablet Take 1 tablet (40 mg total) by mouth daily.   . Multiple Vitamins-Minerals (CENTRUM SILVER ADULT 50+ PO) Take 1 tablet by mouth daily.   . nicotine (NICODERM CQ - DOSED IN MG/24 HOURS) 14 mg/24hr patch Place 1 patch (14 mg total) onto the skin daily. (Patient not taking: Reported on 02/29/2016) 02/29/2016: Patient reports not uses nicotine patches at this time.  . nitroGLYCERIN (NITROSTAT) 0.4 MG SL tablet DISSOLVE 1 TAB UNDER TOUNGE FOR CHEST PAIN. MAY REPEAT EVERY 5 MINUTES FOR 3 DOSES. IF NO RELIEF CALL 911 OR GO TO ER   . Omega-3  Fatty Acids (FISH OIL) 1000 MG CPDR Take 1 capsule by mouth daily.   . pantoprazole (PROTONIX) 40 MG tablet Take 1 tablet (40 mg total) by mouth daily.   . potassium chloride SA (K-DUR,KLOR-CON) 20 MEQ tablet TAKE ONE (1) TABLET EACH DAY WHEN TAKING FUROSEMIDE   . rosuvastatin (CRESTOR) 10 MG tablet Take 1 tablet (10 mg total) by mouth daily.   [name] spironolactone (ALDACTONE) 25 MG tablet Take 0.5 tablets (12.5 mg total) by mouth daily.    No facility-administered encounter medications on file as of 03/04/2016.     Functional Status:   In your present state of health, do you have any difficulty  performing the following activities: 12/04/2015 11/24/2015  Hearing? N N  Vision? N N  Difficulty concentrating or making decisions? [name]  Walking or climbing stairs? N N  Dressing or bathing? N N  Doing errands, shopping? N N  Preparing Food and eating ? N Y  Using the Toilet? - N  In the past six months, have you accidently leaked urine? N N  Do you have problems with loss of bowel control? N N  Managing your Medications? N Y  Managing your Finances? N Y  Housekeeping or managing your Housekeeping? N Y  Some recent data might be hidden    Fall/Depression Screening:    PHQ 2/9 Scores 01/28/2016 12/08/2015 12/04/2015 11/24/2015 10/23/2015 10/08/2015 03/13/2015  PHQ - 2 Score 0 0 0 0 1 1 0    Assessment: RN CM reviewed medications with pt, encouraged and praised pt for weighing daily.  RN CM reminded pt of daily symptom management and action plan.  RN CM reviewed carbohydrate modified diet.  [facility] pharmacist worked with pt and pt decided he does not want bubble pack medications.  THN CM Care Plan Problem One   Flowsheet Row Most Recent Value  THN Long Term Goal (31-90 days)  pt will verbalize better understanding of disease process CHF to facilitate better outcomes and avoid hospitalization within 90 days.  THN Long Term Goal Start Date  02/23/16 [goal restarted]  Interventions for Problem One Long Term Goal  RN CM encouraged pt for daily weight, ask pt to try and record weight  THN CM Short Term Goal #2 (0-30 days)  Pt will verbalize CHF zones within 30 days.  THN CM Short Term Goal #2 Start Date  03/02/16 [goal restarted]  Interventions for Short Term Goal #2  RN CM reviewed CHF zones/ action plan     Plan: follow up with home visit in February Assess CBG and weight  [name] [facility], [place] Coordinator [phone]

## 2016-03-10 ENCOUNTER — Ambulatory Visit: Payer: Medicare Other | Admitting: Family Medicine

## 2016-03-14 NOTE — Progress Notes (Signed)
Subjective:    Patient ID: Brad Mendoza, male    DOB: August 29, 1955, 61 y.o.   MRN: [id number]  HPI  61 year old gentleman with diabetes, history of heart failure, hypertension, and the past his congestive heart failure has been difficult to manage. He had several hospitalizations but more recently has done well from that point of view. Regarding diabetes however, he takes only Levemir and has not been taking Humalog. Last A1c was reflective at 13.1. Today he is complaining of nervousness when he tries to go to sleep at night. We reviewed his medicines and I do not see any potential problems there with anxiety. He has gained significant weight since last visit but he is not edematous. Patient Active Problem List   Diagnosis Date Noted  . CHF exacerbation ([place]) 11/17/2015  . Acute on chronic combined systolic and diastolic CHF (congestive heart failure) ([place]) 11/16/2015  . Acute left eye pain   . Hyperglycemia 05/08/2015  . Type 2 diabetes mellitus with hyperosmolar nonketotic hyperglycemia ([place]) 05/08/2015  . AKI (acute kidney injury) ([place]) 05/08/2015  . Hyponatremia 05/08/2015  . Hypochloremia 05/08/2015  . Type 2 diabetes mellitus with diabetic polyneuropathy ([place]) 12/16/2013  . CAD (coronary artery disease) 09/18/2013  . Heme positive stool 06/12/2013  . Change in stool 06/12/2013  . Seasonal allergic rhinitis 07/11/2012  . Polysubstance abuse 12/24/2010  . Bronchitis 12/24/2010  . Onychomycosis   . Acid reflux   . Nonischemic cardiomyopathy ([place])   . Type 2 diabetes mellitus with complications ([place])   . Hyperlipemia 11/16/2009  . Obesity 11/16/2009  . Tobacco dependence 11/16/2009  . Essential hypertension 11/16/2009  . CHEST PAIN UNSPECIFIED 11/16/2009   Outpatient Encounter Prescriptions as of 03/15/2016  Medication Sig  . albuterol (PROVENTIL HFA;VENTOLIN HFA) 108 (90 Base) MCG/ACT inhaler Inhale 2 puffs into the lungs every 6 (six) hours as needed for wheezing or shortness  of breath.  [name] amLODipine (NORVASC) 5 MG tablet Take 1 tablet (5 mg total) by mouth daily.  [name] aspirin EC 81 MG tablet Take 81 mg by mouth daily.  . Capsaicin (ICY HOT ARTHRITIS THERAPY EX) Apply 1 application topically 2 (two) times daily as needed (back pain).   . carvedilol (COREG) 12.5 MG tablet Take 1 tablet (12.5 mg total) by mouth 2 (two) times daily with a meal.  . fluticasone (FLONASE) 50 MCG/ACT nasal spray USE 2 SPRAYS IN EACH NOSTRIL DAILY  . gabapentin (NEURONTIN) 300 MG capsule TAKE ONE (1) CAPSULE THREE (3) TIMES EACH DAY  . insulin lispro (HUMALOG KWIKPEN) 100 UNIT/ML KiwkPen INJECT UP TO 15 UNITS PRIOR TO EACH MEALAS NEEDED FOR ELAVATED BG  . isosorbide mononitrate (IMDUR) 60 MG 24 hr tablet Take 1.5 tablets (90 mg total) by mouth daily.  [name] LEVEMIR FLEXTOUCH 100 UNIT/ML Pen INJECT 35 UNITS SQ TWICE DAILY WITH BREAKFAST AND SUPPER  . lisinopril (PRINIVIL,ZESTRIL) 40 MG tablet Take 1 tablet (40 mg total) by mouth daily.  . Multiple Vitamins-Minerals (CENTRUM SILVER ADULT 50+ PO) Take 1 tablet by mouth daily.  . nicotine (NICODERM CQ - DOSED IN MG/24 HOURS) 14 mg/24hr patch Place 1 patch (14 mg total) onto the skin daily.  . nitroGLYCERIN (NITROSTAT) 0.4 MG SL tablet DISSOLVE 1 TAB UNDER TOUNGE FOR CHEST PAIN. MAY REPEAT EVERY 5 MINUTES FOR 3 DOSES. IF NO RELIEF CALL 911 OR GO TO ER  . Omega-3 Fatty Acids (FISH OIL) 1000 MG CPDR Take 1 capsule by mouth daily.  . pantoprazole (PROTONIX) 40 MG tablet Take 1  tablet (40 mg total) by mouth daily.  . potassium chloride SA (K-DUR,KLOR-CON) 20 MEQ tablet TAKE ONE (1) TABLET EACH DAY WHEN TAKING FUROSEMIDE  . rosuvastatin (CRESTOR) 10 MG tablet Take 1 tablet (10 mg total) by mouth daily.  [name] spironolactone (ALDACTONE) 25 MG tablet Take 0.5 tablets (12.5 mg total) by mouth daily.  . furosemide (LASIX) 40 MG tablet Take 1.5 tablets (60 mg total) by mouth daily.  [name] LORazepam (ATIVAN) 0.5 MG tablet Take 1 tablet (0.5 mg total) by mouth at bedtime.     No facility-administered encounter medications on file as of 03/15/2016.     Review of Systems  Constitutional: Positive for unexpected weight change.  Respiratory: Negative.   Cardiovascular: Negative.   Neurological: Negative.   Psychiatric/Behavioral: Negative.        Objective:   Physical Exam  Constitutional: He is oriented to person, place, and time. He appears well-developed and well-nourished.  Cardiovascular: Normal rate, regular rhythm and normal heart sounds.   Pulmonary/Chest: Effort normal.  Neurological: He is alert and oriented to person, place, and time. A cranial nerve deficit is present.  Psychiatric: He has a normal mood and affect. His behavior is normal.   BP (!) 156/89   Pulse 75   Temp 97.8 F (36.6 C) (Oral)   Ht 5\' 9"  (1.753 m)   Wt 205 lb 9.6 oz (93.3 kg)   BMI 30.36 kg/m         Assessment & Plan:  1. Type 2 diabetes mellitus with complication, without long-term current use of insulin (HCC) I suspect A1c will not show much change from last time since he has not been using any medicine except Levemir  - Bayer DCA Hb A1c Waived  2. Essential hypertension Medicines include lisinopril carvedilol and amlodipine   3. Acute on chronic combined systolic and diastolic CHF (congestive heart failure) ([place]) Appears compensated today. There is no shortness of breath or dependent edema. He has however gained weight  [name] MD

## 2016-03-15 ENCOUNTER — Ambulatory Visit (INDEPENDENT_AMBULATORY_CARE_PROVIDER_SITE_OTHER): Payer: Medicare Other | Admitting: Family Medicine

## 2016-03-15 ENCOUNTER — Encounter: Payer: Self-pay | Admitting: Family Medicine

## 2016-03-15 VITALS — BP 156/89 | HR 75 | Temp 97.8°F | Ht 69.0 in | Wt 205.6 lb

## 2016-03-15 DIAGNOSIS — I5043 Acute on chronic combined systolic (congestive) and diastolic (congestive) heart failure: Secondary | ICD-10-CM

## 2016-03-15 DIAGNOSIS — I10 Essential (primary) hypertension: Secondary | ICD-10-CM | POA: Diagnosis not present

## 2016-03-15 DIAGNOSIS — E118 Type 2 diabetes mellitus with unspecified complications: Secondary | ICD-10-CM | POA: Diagnosis not present

## 2016-03-15 LAB — BAYER DCA HB A1C WAIVED: HB A1C (BAYER DCA - WAIVED): >14 % — ABNORMAL HIGH (ref <7.0)

## 2016-03-15 MED ORDER — LORAZEPAM 0.5 MG PO TABS: 0.5000 mg | ORAL_TABLET | Freq: Every day | ORAL | 1 refills | Status: DC

## 2016-03-18 DIAGNOSIS — I10 Essential (primary) hypertension: Secondary | ICD-10-CM | POA: Diagnosis not present

## 2016-03-18 DIAGNOSIS — Z79899 Other long term (current) drug therapy: Secondary | ICD-10-CM | POA: Diagnosis not present

## 2016-03-18 DIAGNOSIS — E559 Vitamin D deficiency, unspecified: Secondary | ICD-10-CM | POA: Diagnosis not present

## 2016-03-18 DIAGNOSIS — R809 Proteinuria, unspecified: Secondary | ICD-10-CM | POA: Diagnosis not present

## 2016-03-18 DIAGNOSIS — D509 Iron deficiency anemia, unspecified: Secondary | ICD-10-CM | POA: Diagnosis not present

## 2016-03-18 DIAGNOSIS — N183 Chronic kidney disease, stage 3 (moderate): Secondary | ICD-10-CM | POA: Diagnosis not present

## 2016-03-21 DIAGNOSIS — I10 Essential (primary) hypertension: Secondary | ICD-10-CM | POA: Diagnosis not present

## 2016-03-21 DIAGNOSIS — R809 Proteinuria, unspecified: Secondary | ICD-10-CM | POA: Diagnosis not present

## 2016-03-21 DIAGNOSIS — E1129 Type 2 diabetes mellitus with other diabetic kidney complication: Secondary | ICD-10-CM | POA: Diagnosis not present

## 2016-03-21 DIAGNOSIS — N182 Chronic kidney disease, stage 2 (mild): Secondary | ICD-10-CM | POA: Diagnosis not present

## 2016-03-23 ENCOUNTER — Other Ambulatory Visit: Payer: Self-pay | Admitting: Licensed Clinical Social Worker

## 2016-03-23 NOTE — Patient Outreach (Signed)
Assessment:  CSW spoke via phone with client. CSW verified client identity. CSW received verbal permission from client on 03/23/16 for CSW to speak with client about current client needs. Client sees Dr. Alain Honey as primary care doctor. Client said he had an appointment with Dr. Sabra Heck last week.  Client said he had his prescribed medications. Client and CSW spoke of client care plan. CSW encouraged client to communicate with CSW in next 30 days to discuss community resources of assistance for client. Client said he drives himself to scheduled client medical appointments. Client said he has support from his spouse. Client reports that he has adequate food supply. He said he has scales to use to monitor weight of client. Client had been completing application with [employer] to seek home repair assistance for client. Client said he will finish application to [employer] for home repairs tomorrow and plans to mail off application for home repairs in next 2 days. Client said he did speak via phone with Contractor. Representative encouraged Ryo to send in application for housing repairs and representative would review client application for home repairs. Client said he is eating adequately. He said he plans to go to local church this week to seek some food assistance from that church.  He did not mention any pain issues. Client said he is doing well with activities of daily living. Client receives [facility] nursing support with RN [name]. Client said he has some support from a neighbor who lives nearby. CSW encouraged Johnluke to call CSW at [phone] as needed to discuss social work needs of client. Client was appreciative of call from [place] on 03/23/16.     Plan:  Client to communicate with CSW in next 30 days to discuss community resources of assistance for client.  CSW to collaborate with RN [name] as needed in monitoring needs of client.  CSW to call  client in 4 weeks to assess client needs at that time.   [name].Josecarlos Harriott MSW, LCSW Licensed Clinical Social Worker [facility] Care Management [phone]

## 2016-03-28 ENCOUNTER — Ambulatory Visit (INDEPENDENT_AMBULATORY_CARE_PROVIDER_SITE_OTHER): Payer: Medicare Other | Admitting: Pharmacist

## 2016-03-28 VITALS — BP 144/85 | HR 80 | Ht 69.0 in | Wt 208.0 lb

## 2016-03-28 DIAGNOSIS — B353 Tinea pedis: Secondary | ICD-10-CM | POA: Diagnosis not present

## 2016-03-28 DIAGNOSIS — E118 Type 2 diabetes mellitus with unspecified complications: Secondary | ICD-10-CM

## 2016-03-28 MED ORDER — TERBINAFINE HCL 1 % EX CREA: 1.0000 "application " | TOPICAL_CREAM | Freq: Two times a day (BID) | CUTANEOUS | 1 refills | Status: DC

## 2016-03-28 MED ORDER — GABAPENTIN 300 MG PO CAPS: 300.0000 mg | ORAL_CAPSULE | Freq: Every day | ORAL | 1 refills | Status: DC

## 2016-03-28 NOTE — Progress Notes (Signed)
Patient ID: Brad Mendoza, male   DOB: 05-22-1955, 61 y.o.   MRN: [id number]   Subjective:    Brad Mendoza is a 61 y.o. male who presents for an follow up evaluation of Type 2 diabetes mellitus requiring insulin for control.  Patient was seen by PCP about 2 weeks ago and A1c was found to be over 14%.  He has stopped humalog and was just taking Levemir 34 units bid.  Patient has restarted Humalog and BG in office today was 109   Known diabetic complications: nephropathy and cardiovascular disease Cardiovascular risk factors: advanced age (older than 38 for men, 39 for women), diabetes mellitus, dyslipidemia, hypertension, male gender, microalbuminuria, obesity (BMI >= 30 kg/m2) and sedentary lifestyle  Current (within one year): no - patient has appt with Dr Marin Comment scheduled Weight trend: stable Prior visit with CDE: yes - last was 2016 Current diet: somewhat balanced diet - trying to limit salt and sugar intake. Current exercise: none Medication Compliance?  Yes  Is now using Novolog daily  Current monitoring regimen: home blood tests - 1-2 times daily Home blood sugar records: 220's in am .   BG in the office today was 109 Any episodes of hypoglycemia? no  Is He on ACE inhibitor or angiotensin II receptor blocker?  No     The following portions of the patient's history were reviewed and updated as appropriate: allergies, current medications, past family history, past medical history and problem list.    Objective:    BP (!) 144/85   Pulse 80   Ht 5\' 9"  (1.753 m)   Wt 208 lb (94.3 kg)   BMI 30.72 kg/m   Lab Review Glucose, Bld (mg/dL)  Date Value  11/20/2015 120 (H)  11/19/2015 145 (H)  11/18/2015 72   CO2 (mmol/L)  Date Value  11/20/2015 26  11/19/2015 26  11/18/2015 27   BUN (mg/dL)  Date Value  11/20/2015 27 (H)  11/19/2015 27 (H)  11/18/2015 30 (H)  10/23/2015 23  10/08/2015 21  10/23/2014 20   Creat (mg/dL)  Date Value  07/11/2012 1.04    Creatinine, Ser (mg/dL)  Date Value  11/20/2015 1.04  11/19/2015 1.13  11/18/2015 1.15   Diabetic Foot Form - Detailed   Diabetic Foot Exam - detailed Diabetic Foot exam was performed with the following findings:  Yes 03/28/2016  2:42 PM  Visual Foot Exam completed.:  Yes  Is there a history of foot ulcer?:  No Can the patient see the bottom of their feet?:  Yes Are the shoes appropriate in style and fit?:  Yes Is there swelling or and abnormal foot shape?:  No Are the toenails long?:  No Are the toenails thick?:  Yes Do you have pain in calf while walking?:  Yes Is there a claw toe deformity?:  No Is there elevated skin temparature?:  No Is there limited skin dorsiflexion?:  No Is there foot or ankle muscle weakness?:  No Are the toenails ingrown?:  No Normal Range of Motion:  Yes Pulse Foot Exam completed.:  Yes  Right posterior Tibialias:  Present   Right Dorsalis Pedis:  Diminished Left Dorsalis Pedis:  Diminished  Sensory Foot Exam Completed.:  Yes Swelling:  No Semmes-Weinstein Monofilament Test R Foot Test Control:  Pos L Foot Test Control:  Pos  R Site 1-Great Toe:  Pos L Site 1-Great Toe:  Pos  R Site 4:  Pos L Site 4:  Pos  R Site 5:  Pos  L Site 5:  Pos    Comments:  Presence of dry cracked skin on bottom of feet and between toes indicating fungal infection Patient has seen podiatrist in past but declined referral today     Assessment:    Diabetes Mellitus type II, under inadequate control but improving since restarted Humalog.   Tinea pedis    Plan:    1.  Rx changes: none   OTC terbinafine 1% apply to both feet daily 2.  Education: Reviewed 'ABCs' of diabetes management (respective goals in parentheses):  A1C (<7), blood pressure (<130/80), and cholesterol (LDL <100). 3. Discussed importance of eye exam and other preventative measure to avoid complications of DM 4. CHO counting diet discussed.  Reviewed CHO amount in various foods and how to read  nutrition labels.  Discussed recommended serving sizes.  5.  Recommend check BG 3  times a day 6.  Recommended increase physical activity - goal is 150 minutes per week 7. Follow up: 1 month    Patient given number of [employer] to use if needed for financial assistance with heat / power bill

## 2016-03-28 NOTE — Patient Instructions (Signed)
[employer] [phone]   Diabetes and Standards of Medical Care   Diabetes is complicated. You may find that your diabetes team includes a dietitian, nurse, diabetes educator, eye doctor, and more. To help everyone know what is going on and to help you get the care you deserve, the following schedule of care was developed to help keep you on track. Below are the tests, exams, vaccines, medicines, education, and plans you will need.  Blood Glucose Goals Prior to meals = 80 - 130 Within 2 hours of the start of a meal = less than 180  HbA1c test (goal is less than 7.0% - your last value was over 14%) This test shows how well you have controlled your glucose over the past 2 to 3 months. It is used to see if your diabetes management plan needs to be adjusted.   It is performed at least 2 times a year if you are meeting treatment goals.  It is performed 4 times a year if therapy has changed or if you are not meeting treatment goals.  Blood pressure test  This test is performed at every routine medical visit. The goal is less than 140/90 mmHg for most people, but 130/80 mmHg in some cases. Ask your health care provider about your goal.  Dental exam  Follow up with the dentist regularly.  Eye exam  If you are diagnosed with type 1 diabetes as a child, get an exam upon reaching the age of 109 years or older and have had diabetes for 3 to 5 years. Yearly eye exams are recommended after that initial eye exam.  If you are diagnosed with type 1 diabetes as an adult, get an exam within 5 years of diagnosis and then yearly.  If you are diagnosed with type 2 diabetes, get an exam as soon as possible after the diagnosis and then yearly.  Foot care exam  Visual foot exams are performed at every routine medical visit. The exams check for cuts, injuries, or other problems with the feet.  A comprehensive foot exam should be done yearly. This includes visual inspection as well as assessing foot  pulses and testing for loss of sensation.  Check your feet nightly for cuts, injuries, or other problems with your feet. Tell your health care provider if anything is not healing.  Kidney function test (urine microalbumin)  This test is performed once a year.  Type 1 diabetes: The first test is performed 5 years after diagnosis.  Type 2 diabetes: The first test is performed at the time of diagnosis.  A serum creatinine and estimated glomerular filtration rate (eGFR) test is done once a year to assess the level of chronic kidney disease (CKD), if present.  Lipid profile (cholesterol, HDL, LDL, triglycerides)  Performed every 5 years for most people.  The goal for LDL is less than 100 mg/dL. If you are at high risk, the goal is less than 70 mg/dL.  The goal for HDL is 40 mg/dL to 50 mg/dL for men and 50 mg/dL to 60 mg/dL for women. An HDL cholesterol of 60 mg/dL or higher gives some protection against heart disease.  The goal for triglycerides is less than 150 mg/dL.  Influenza vaccine, pneumococcal vaccine, and hepatitis B vaccine  The influenza vaccine is recommended yearly.  The pneumococcal vaccine is generally given once in a lifetime. However, there are some instances when another vaccination is recommended. Check with your health care provider.  The hepatitis B vaccine  is also recommended for adults with diabetes.  Diabetes self-management education  Education is recommended at diagnosis and ongoing as needed.  Treatment plan  Your treatment plan is reviewed at every medical visit.  Document Released: 12/05/2008 Document Revised: 10/10/2012 Document Reviewed: 07/10/2012 [facility] Patient Information 2014 [place].

## 2016-03-29 ENCOUNTER — Other Ambulatory Visit: Payer: Self-pay | Admitting: Family Medicine

## 2016-03-29 ENCOUNTER — Encounter: Payer: Self-pay | Admitting: *Deleted

## 2016-03-29 ENCOUNTER — Other Ambulatory Visit: Payer: Self-pay | Admitting: *Deleted

## 2016-03-29 DIAGNOSIS — I10 Essential (primary) hypertension: Secondary | ICD-10-CM

## 2016-03-29 NOTE — Patient Outreach (Signed)
[facility]) Care Management   03/29/2016  Brad Mendoza 1955-03-18 [id number]  Brad Mendoza is an 61 y.o. male  Subjective: Routine home visit with pt, HIPAA verified, pt reports "my weight is up some, I've been inside because of the cold weather and I've been eating a lot of food"  Weighing daily, not recording, checking CBG, not recording and does not want to work towards this goal.  Pt states he saw pharmacist at primary MD office yesterday for medication reconciliation/ teaching and diet teaching.  Pt states he has been eating potato chips and other salty foods.  Pt states sometimes he gets tired of taking his medication and insulin injections.  Pt states kidney biopsy unremarkable.  Objective:   Vitals:   03/29/16 1408  BP: (!) 142/62  Pulse: 81  Resp: 16  SpO2: 99%  Weight: 210 lb (95.3 kg)  CBG 228 today 7 day average 294 14 day average 306 30 day average 356  ROS  Physical Exam  Constitutional: He is oriented to person, place, and time. He appears well-developed and well-nourished.  HENT:  Head: Normocephalic.  Neck: Normal range of motion. Neck supple.  Cardiovascular: Normal rate and regular rhythm.   Respiratory: Effort normal and breath sounds normal.  GI: Soft. Bowel sounds are normal.  Musculoskeletal: Normal range of motion. He exhibits no edema.  Neurological: He is alert and oriented to person, place, and time.  Skin: Skin is warm and dry.  Psychiatric: He has a normal mood and affect. His behavior is normal. Judgment and thought content normal.    Encounter Medications:   Outpatient Encounter Prescriptions as of 03/29/2016  Medication Sig Note  . albuterol (PROVENTIL HFA;VENTOLIN HFA) 108 (90 Base) MCG/ACT inhaler Inhale 2 puffs into the lungs every 6 (six) hours as needed for wheezing or shortness of breath. 02/29/2016: Uses PRN  . amLODipine (NORVASC) 5 MG tablet Take 1 tablet (5 mg total) by mouth daily.   [name] aspirin EC 81 MG tablet  Take 81 mg by mouth daily.   . Capsaicin (ICY HOT ARTHRITIS THERAPY EX) Apply 1 application topically 2 (two) times daily as needed (back pain).    . carvedilol (COREG) 12.5 MG tablet Take 1 tablet (12.5 mg total) by mouth 2 (two) times daily with a meal.   . fluticasone (FLONASE) 50 MCG/ACT nasal spray USE 2 SPRAYS IN EACH NOSTRIL DAILY   . gabapentin (NEURONTIN) 300 MG capsule Take 1 capsule (300 mg total) by mouth at bedtime.   . insulin lispro (HUMALOG KWIKPEN) 100 UNIT/ML KiwkPen INJECT UP TO 15 UNITS PRIOR TO EACH MEALAS NEEDED FOR ELAVATED BG   . isosorbide mononitrate (IMDUR) 60 MG 24 hr tablet Take 1.5 tablets (90 mg total) by mouth daily.   [name] LEVEMIR FLEXTOUCH 100 UNIT/ML Pen INJECT 35 UNITS SQ TWICE DAILY WITH BREAKFAST AND SUPPER (Patient taking differently: INJECT 34 UNITS SQ TWICE DAILY WITH BREAKFAST AND SUPPER)   . lisinopril (PRINIVIL,ZESTRIL) 40 MG tablet Take 1 tablet (40 mg total) by mouth daily.   . Multiple Vitamins-Minerals (CENTRUM SILVER ADULT 50+ PO) Take 1 tablet by mouth daily.   . nitroGLYCERIN (NITROSTAT) 0.4 MG SL tablet DISSOLVE 1 TAB UNDER TOUNGE FOR CHEST PAIN. MAY REPEAT EVERY 5 MINUTES FOR 3 DOSES. IF NO RELIEF CALL 911 OR GO TO ER   . Omega-3 Fatty Acids (FISH OIL) 1000 MG CPDR Take 1 capsule by mouth daily.   . pantoprazole (PROTONIX) 40 MG tablet Take 1 tablet (  40 mg total) by mouth daily.   . rosuvastatin (CRESTOR) 10 MG tablet Take 1 tablet (10 mg total) by mouth daily.   [name] terbinafine (LAMISIL) 1 % cream Apply 1 application topically 2 (two) times daily. Apply to both feet and between toes   . furosemide (LASIX) 40 MG tablet Take 1.5 tablets (60 mg total) by mouth daily.   [name] LORazepam (ATIVAN) 0.5 MG tablet Take 1 tablet (0.5 mg total) by mouth at bedtime. (Patient not taking: Reported on 03/29/2016)   . potassium chloride SA (K-DUR,KLOR-CON) 20 MEQ tablet TAKE ONE (1) TABLET EACH DAY WHEN TAKING FUROSEMIDE (Patient not taking: Reported on 03/29/2016)   .  spironolactone (ALDACTONE) 25 MG tablet Take 0.5 tablets (12.5 mg total) by mouth daily. (Patient not taking: Reported on 03/29/2016) 03/29/2016: Pt is out and is to pickup medication today   No facility-administered encounter medications on file as of 03/29/2016.     Functional Status:   In your present state of health, do you have any difficulty performing the following activities: 12/04/2015 11/24/2015  Hearing? N N  Vision? N N  Difficulty concentrating or making decisions? [name]  Walking or climbing stairs? N N  Dressing or bathing? N N  Doing errands, shopping? N N  Preparing Food and eating ? N Y  Using the Toilet? - N  In the past six months, have you accidently leaked urine? N N  Do you have problems with loss of bowel control? N N  Managing your Medications? N Y  Managing your Finances? N Y  Housekeeping or managing your Housekeeping? N Y  Some recent data might be hidden    Fall/Depression Screening:    PHQ 2/9 Scores 03/15/2016 01/28/2016 12/08/2015 12/04/2015 11/24/2015 10/23/2015 10/08/2015  PHQ - 2 Score 0 0 0 0 0 1 1    Assessment:  RN CM observed all medication bottles and reviewed with pt, pt is out of spironolactone since yesterday and is picking up today from pharmacy, pt still does not want to switch to a pharmacy that prefills medications for pt, pt does have issues reading but not willing to change his medication routine, there is no one to assist pt with his medications, pt states he is taking medications as prescribed "most of time".  RN CM talked with pt about importance of taking medications as prescribed and not running out of diuretic.  Pt admits he does not eat what he should, eats large portions, gets bored with his routine, tired of taking insulin.  RN CM reviewed CBG averages with pt and pt is willing to try and modify his diet.  Hgb AIC near 14.  RN CM faxed update to primary MD Dr. Sabra Heck reporting CBG averages, weight gain, no edema, asymptomatic.  MD noted on last  visit that pt had gained weight but asymptomatic and no changes made.  THN CM Care Plan Problem One   Flowsheet Row Most Recent Value  THN Long Term Goal (31-90 days)  pt will verbalize better understanding of disease process CHF to facilitate better outcomes and avoid hospitalization within 90 days.  THN Long Term Goal Start Date  02/23/16 [goal restarted]  Interventions for Problem One Long Term Goal  RN CM ask pt to continue weighing daily, pt is not going to record, he does not want to work towards this as a part of his goal.  THN CM Short Term Goal #2 (0-30 days)  Pt will verbalize CHF zones within 30 days.  THN  CM Short Term Goal #2 Start Date  04/02/16 [goal restarted]  Interventions for Short Term Goal #2  RN CM reinforced CHF zones/ action plan, importance of calling MD early for change in health status, symptoms    THN CM Care Plan Problem Two   Flowsheet Row Most Recent Value  Care Plan Problem Two  Knowledge deficit related to diabetes  Role Documenting the Problem Two  Care Management Coordinator  Care Plan for Problem Two  Active  THN CM Short Term Goal #1 (0-30 days)  Pt will eat carbohydrate modified diet within 30 days  THN CM Short Term Goal #1 Start Date  03/29/16  Interventions for Short Term Goal #2   RN CM reviewed foods high in carbohydrates to limit/ avoid, reviewed how many carbohydrate choices allowed at each meal, reviewed correlation of sugar, sweet desserts, etc. on CBG reading      Plan: see pt for home visit in March  [name] [facility], [place] [phone]

## 2016-04-05 DIAGNOSIS — J329 Chronic sinusitis, unspecified: Secondary | ICD-10-CM | POA: Diagnosis not present

## 2016-04-25 ENCOUNTER — Other Ambulatory Visit: Payer: Self-pay | Admitting: Licensed Clinical Social Worker

## 2016-04-25 NOTE — Patient Outreach (Signed)
Assessment:  CSW spoke via phone with client. CSW verified client identity.  CSW and client spoke of client needs. Client sees Dr. Sabra Heck as primary care doctor. Client said he had his prescribed medications and is taking medication as prescribed.CSW and client spoke of client care plan. CSW encouraged client to communicate with CSW in next 30 days to discuss community resources of assistance for client.  Client said he drives himself to scheduled client medical appointments. Client said he has support from his spouse. Client reported previously that client was working on application for home repairs with [employer]. Client said that he had mailed in this application for housing repairs to [employer]. He said he is waiting to hear back from [place] related to client assistance with housing repairs.  Client said he has adequate food supply. Client said he sometimes goes to local church to seek food assistance for client. Client did not mention any pain issues. He said he is still doing well with activities of daily living. Client receives [facility] nursing support with RN [name].  Client said he has a local neighbor who also helps client occasionally if needed.  Client said he is using [employer] transport to help him travel to and from local errands for client. Client said he is familiar with [employer] bus schedule and can utilize [employer] transport to help him travel locally as needed. Client said he plants a garden to help him relax and he said he enjoys working in his garden. Client said he is trying to attend client's scheduled medical appointments. Client said he is appreciative of nursing support with RN [name]. CSW spoke with client about [facility] program support.  CSW encouraged Jencarlos to call CSW at [phone] as needed to discuss social work needs of client. CSW thanked R[place] for phone call with CSW on 04/25/16.   Plan:  Client to communicate with  CSW in next 30 days  to discuss community resources of assistance for client.  CSW to collaborate with RN [name] in monitoring needs of client.  CSW to call client in 4 weeks to assess client needs at that time.  [name].Crystalle Popwell MSW, LCSW Licensed Clinical Social Worker [facility] Care Management [phone]

## 2016-04-26 ENCOUNTER — Other Ambulatory Visit: Payer: Self-pay | Admitting: *Deleted

## 2016-04-26 ENCOUNTER — Encounter: Payer: Self-pay | Admitting: *Deleted

## 2016-04-26 NOTE — Patient Outreach (Signed)
[facility]) Care Management   04/26/2016  Brad Mendoza 01-Aug-1955 [id number]  Brad Mendoza is an 61 y.o. male  Subjective: Routine home visit with pt, HIPAA verified, pt reports he is weighing but not everyday and not recording and does not intend to, pt states he checks CBG 2-3 times daily but does not record and does not intend to.  Pt reports no changes with medications and has all medications on hand. Pt states he is taking levemir 34 units BID and humalog sliding scale TID but cannot verbalize the scale. At mid- afternoon, pt states he has not taken levemir today and has not taken humalog,  Pt eating peanut butter and jelly sandwich, CBG 349 when pt checked, pt did then take insulin.  Pt states he still has financial challenges and his mobile home needs " a lot of work done"  [facility] CSW still involved with pt.  Objective:   Vitals:   04/26/16 1406  BP: 136/82  Pulse: 84  Resp: 16  SpO2: 96%  Weight: 213 lb (96.6 kg)  CBG 7 day average 344 14 day average 345 30 day average 242  ROS  Physical Exam  Constitutional: He is oriented to person, place, and time. He appears well-developed and well-nourished.  HENT:  Head: Normocephalic.  Neck: Normal range of motion. Neck supple.  Cardiovascular: Normal rate and regular rhythm.   Respiratory: Effort normal and breath sounds normal.  GI: Soft. Bowel sounds are normal.  Musculoskeletal: Normal range of motion. He exhibits no edema.  Neurological: He is alert and oriented to person, place, and time.  Skin: Skin is warm and dry.  Psychiatric: He has a normal mood and affect. His behavior is normal. Judgment and thought content normal.    Encounter Medications:   Outpatient Encounter Prescriptions as of 04/26/2016  Medication Sig Note  . albuterol (PROVENTIL HFA;VENTOLIN HFA) 108 (90 Base) MCG/ACT inhaler Inhale 2 puffs into the lungs every 6 (six) hours as needed for wheezing or shortness of breath. 02/29/2016: Uses  PRN  . amLODipine (NORVASC) 5 MG tablet Take 1 tablet (5 mg total) by mouth daily.   [name] aspirin EC 81 MG tablet Take 81 mg by mouth daily.   . Capsaicin (ICY HOT ARTHRITIS THERAPY EX) Apply 1 application topically 2 (two) times daily as needed (back pain).    . carvedilol (COREG) 12.5 MG tablet Take 1 tablet (12.5 mg total) by mouth 2 (two) times daily with a meal.   . fluticasone (FLONASE) 50 MCG/ACT nasal spray USE 2 SPRAYS IN EACH NOSTRIL DAILY   . gabapentin (NEURONTIN) 300 MG capsule Take 1 capsule (300 mg total) by mouth at bedtime.   [name] HUMALOG KWIKPEN 100 UNIT/ML KiwkPen INJECT UP TO 15 UNITS PRIOR TO EACH MEALAS NEEDED FOR ELAVATED BG   . isosorbide mononitrate (IMDUR) 60 MG 24 hr tablet Take 1.5 tablets (90 mg total) by mouth daily.   [name] LEVEMIR FLEXTOUCH 100 UNIT/ML Pen INJECT 35 UNITS SQ TWICE DAILY WITH BREAKFAST AND SUPPER (Patient taking differently: INJECT 34 UNITS SQ TWICE DAILY WITH BREAKFAST AND SUPPER)   . lisinopril (PRINIVIL,ZESTRIL) 40 MG tablet TAKE ONE (1) TABLET EACH DAY   . Multiple Vitamins-Minerals (CENTRUM SILVER ADULT 50+ PO) Take 1 tablet by mouth daily.   . nitroGLYCERIN (NITROSTAT) 0.4 MG SL tablet DISSOLVE 1 TAB UNDER TOUNGE FOR CHEST PAIN. MAY REPEAT EVERY 5 MINUTES FOR 3 DOSES. IF NO RELIEF CALL 911 OR GO TO ER   .  Omega-3 Fatty Acids (FISH OIL) 1000 MG CPDR Take 1 capsule by mouth daily.   . pantoprazole (PROTONIX) 40 MG tablet Take 1 tablet (40 mg total) by mouth daily.   . rosuvastatin (CRESTOR) 10 MG tablet Take 1 tablet (10 mg total) by mouth daily.   [name] spironolactone (ALDACTONE) 25 MG tablet Take 0.5 tablets (12.5 mg total) by mouth daily. 04/26/2016: Pt has medication on hand   . terbinafine (LAMISIL) 1 % cream Apply 1 application topically 2 (two) times daily. Apply to both feet and between toes   . furosemide (LASIX) 40 MG tablet Take 1.5 tablets (60 mg total) by mouth daily.   [name] LORazepam (ATIVAN) 0.5 MG tablet Take 1 tablet (0.5 mg total) by mouth at  bedtime. (Patient not taking: Reported on 03/29/2016)   . [DISCONTINUED] potassium chloride SA (K-DUR,KLOR-CON) 20 MEQ tablet TAKE ONE (1) TABLET EACH DAY WHEN TAKING FUROSEMIDE (Patient not taking: Reported on 03/29/2016)    No facility-administered encounter medications on file as of 04/26/2016.     Functional Status:   In your present state of health, do you have any difficulty performing the following activities: 12/04/2015 11/24/2015  Hearing? N N  Vision? N N  Difficulty concentrating or making decisions? [name]  Walking or climbing stairs? N N  Dressing or bathing? N N  Doing errands, shopping? N N  Preparing Food and eating ? N Y  Using the Toilet? - N  In the past six months, have you accidently leaked urine? N N  Do you have problems with loss of bowel control? N N  Managing your Medications? N Y  Managing your Finances? N Y  Housekeeping or managing your Housekeeping? N Y  Some recent data might be hidden    Fall/Depression Screening:    PHQ 2/9 Scores 03/15/2016 01/28/2016 12/08/2015 12/04/2015 11/24/2015 10/23/2015 10/08/2015  PHQ - 2 Score 0 0 0 0 0 1 1    Assessment:  Pt has scale provided by [facility] RN but does not weigh consistently every day, is now weighing at the most several times per week and is not recording, also not recording CBG but reports checking CBG at least twice daily. Pt does not have good glycemic control. Pt does not feel these are goals he wants to work towards at present.  RN CM observed all medication bottles and reviewed with pt and due to poor literacy, pt sometimes gets medication dosages confused, also gets sliding scale for insulin confused,  [facility] pharmacist worked with pt and pt refused to switch pharmacies and have medications prefilled and delivered to his home, also refused home visit from [place].  RN CM talked with pt once again about importance of taking medications exactly as prescribed by MD and that prefilling medications from pharmacy would most  likely work well for pt, pt still refuses this option.  Pt does work with pharmacist Cherre Robins at primary MD office, RN CM sent in basket to [place] reporting all of the above and concern over medications at home.  Pt has poor support system, [facility] CSW continues to work with pt.  RN CM discussed discharge plans with pt and since pt is not willing to work towards any goals of making changes to his health such as diet, medication management, recording weights and CBG, etc. RN CM will discharge pt today.  RN CM let pt know that if pt changes his mind and decides he is willing to work towards a goal that would work towards  improving his self care, pt can be enrolled back in program for nursing, pt verbalizes understanding.  RN CM sent in basket to  [place] and informed of discharge.   [facility] CM Care Plan Problem One   Flowsheet Row Most Recent Value  [facility] Long Term Goal (31-90 days)  pt will verbalize better understanding of disease process CHF to facilitate better outcomes and avoid hospitalization within 90 days.  [facility] Long Term Goal Start Date  02/23/16 [goal restarted]  Interventions for Problem One Long Term Goal  RN CM ask pt to continue weighing daily, pt is not going to record, he does not want to work towards this as a part of his goal.  [facility] CM Short Term Goal #2 (0-30 days)  Pt will verbalize CHF zones within 30 days.  [facility] CM Short Term Goal #2 Start Date  04/02/16 [goal restarted]  [facility] CM Short Term Goal #2 Met Date  04/26/16 [pt verbalizes zones/ action plan]  Interventions for Short Term Goal #2  RN CM reiterated CHF zones/ action plan, importance of calling MD early for change in health status, symptoms    [facility] CM Care Plan Problem Two   Flowsheet Row Most Recent Value  Care Plan Problem Two  Knowledge deficit related to diabetes  Role Documenting the Problem Two  Care Management Coordinator  Care Plan for Problem Two  Active  [facility] CM Short Term Goal #1 (0-30 days)  Pt will eat  carbohydrate modified diet within 30 days  [facility] CM Short Term Goal #1 Start Date  03/29/16  [facility] CM Short Term Goal #1 Met Date   -- [not met]  Interventions for Short Term Goal #2   RN CM reiterated foods high in carbohydrates to limit/ avoid, reviewed how many carbohydrate choices allowed at each meal, reviewed correlation of sugar, sweet desserts, etc. on CBG reading      Plan: discharge today from nursing services CSW continues  [name] [facility], [place] [phone]

## 2016-05-02 ENCOUNTER — Ambulatory Visit: Payer: Self-pay | Admitting: Pharmacist

## 2016-05-04 ENCOUNTER — Encounter (INDEPENDENT_AMBULATORY_CARE_PROVIDER_SITE_OTHER): Payer: Self-pay | Admitting: Internal Medicine

## 2016-05-04 ENCOUNTER — Encounter (INDEPENDENT_AMBULATORY_CARE_PROVIDER_SITE_OTHER): Payer: Self-pay

## 2016-05-24 ENCOUNTER — Other Ambulatory Visit: Payer: Self-pay | Admitting: Adult Health

## 2016-05-24 ENCOUNTER — Other Ambulatory Visit: Payer: Self-pay | Admitting: Family Medicine

## 2016-05-24 ENCOUNTER — Telehealth: Payer: Self-pay | Admitting: Family Medicine

## 2016-05-24 DIAGNOSIS — I10 Essential (primary) hypertension: Secondary | ICD-10-CM

## 2016-05-24 MED ORDER — INSULIN GLARGINE 300 UNIT/ML ~~LOC~~ SOPN: 70.0000 [IU] | PEN_INJECTOR | Freq: Every day | SUBCUTANEOUS | 0 refills | Status: DC

## 2016-05-24 NOTE — Telephone Encounter (Signed)
Called pharmacy and checked formulary - preferred insulins are Toujeo or Lantus.  Rx sent in for Toujeo 70 units once a day.  Patient notified and appt made for Dr Dettinger for 05/30/16.

## 2016-05-24 NOTE — Telephone Encounter (Signed)
He Tammy, I'm sending this to you because I have not seen this guy yet and he was a Dr. Sabra Heck patient but he has seen you. I was going to switch the Lantus and from what I can tell he was on 35 units of the Levemir previously. I just wanted to double check this with you before I send the wrong dose. Caryl Pina, MD [place] [facility] 05/24/2016, 1:24 PM

## 2016-05-26 ENCOUNTER — Telehealth: Payer: Self-pay

## 2016-05-26 NOTE — Telephone Encounter (Signed)
Rx or Levemir was switched to preferred product - Toujeo on Tuesday.

## 2016-05-27 ENCOUNTER — Encounter: Payer: Self-pay | Admitting: Licensed Clinical Social Worker

## 2016-05-27 ENCOUNTER — Other Ambulatory Visit: Payer: Self-pay | Admitting: Licensed Clinical Social Worker

## 2016-05-27 NOTE — Patient Outreach (Signed)
Assessment:  CSW spoke via phone with client. CSW verified client identity. CSW and client spoke of client needs. Cient said he drives himself to needed client medical appointments. Client has prescribed medications and is taking medications as prescribed. Client sees Dr. Sabra Heck as primary care doctor. Client said he is attending scheduled client medical appointments. Client did not mention any pain issues. Client said he has support from his spouse. Client has mailed in application to [employer] to seek assistance with home repairs; client said he is waiting to hear back from [employer] regarding home repairs for client. Client said he has adequate food supply. He said he goes occasionally to [employer] pantry for food assistance/support. Client said he is doing well with activities of daily living. RN [name] has discharged client from [facility]. CSW informed client on 05/27/16 that client had met client's care plan goals with [facility] CSW services. Thus, CSW informed client on 05/27/16 that [facility] would discharge client from [facility] services on 05/27/16 since client had met care plan goals for client with CSW services. Client agreed to this plan. CSW congratulated client on meeting care plan goals for client. CSW thanked client for phone call with CSW on 05/27/16. Client said he was appreciative of CSW support with client in recent months.   Plan:  CSW is discharging [place] from [facility] on 05/27/16 since client has met care plan goals for client with CSW services  CSW to inform Alycia Rossetti that [facility] discharged client from [facility] services on 05/27/16.  CSW to fax physician case closure letter to Dr. Sabra Heck informing Dr. Sabra Heck that [facility] discharged client from [facility] CSW services on 05/27/16.  [name].Makinzie Considine MSW, LCSW Licensed Clinical Social Worker [facility] Care Management [phone]                 Plan:

## 2016-05-30 ENCOUNTER — Ambulatory Visit (INDEPENDENT_AMBULATORY_CARE_PROVIDER_SITE_OTHER): Payer: Medicare Other | Admitting: Family Medicine

## 2016-05-30 ENCOUNTER — Encounter: Payer: Self-pay | Admitting: Family Medicine

## 2016-05-30 VITALS — BP 147/88 | HR 87 | Temp 98.1°F | Ht 69.0 in | Wt 231.0 lb

## 2016-05-30 DIAGNOSIS — E7800 Pure hypercholesterolemia, unspecified: Secondary | ICD-10-CM | POA: Diagnosis not present

## 2016-05-30 DIAGNOSIS — Z6834 Body mass index (BMI) 34.0-34.9, adult: Secondary | ICD-10-CM

## 2016-05-30 DIAGNOSIS — Z1159 Encounter for screening for other viral diseases: Secondary | ICD-10-CM | POA: Diagnosis not present

## 2016-05-30 DIAGNOSIS — E1142 Type 2 diabetes mellitus with diabetic polyneuropathy: Secondary | ICD-10-CM

## 2016-05-30 DIAGNOSIS — E118 Type 2 diabetes mellitus with unspecified complications: Secondary | ICD-10-CM | POA: Diagnosis not present

## 2016-05-30 DIAGNOSIS — E6609 Other obesity due to excess calories: Secondary | ICD-10-CM

## 2016-05-30 DIAGNOSIS — I10 Essential (primary) hypertension: Secondary | ICD-10-CM

## 2016-05-30 DIAGNOSIS — R609 Edema, unspecified: Secondary | ICD-10-CM | POA: Diagnosis not present

## 2016-05-30 DIAGNOSIS — Z794 Long term (current) use of insulin: Secondary | ICD-10-CM | POA: Diagnosis not present

## 2016-05-30 LAB — BAYER DCA HB A1C WAIVED: HB A1C (BAYER DCA - WAIVED): 10.6 % — ABNORMAL HIGH

## 2016-05-30 MED ORDER — METFORMIN HCL 500 MG PO TABS: 500.0000 mg | ORAL_TABLET | Freq: Two times a day (BID) | ORAL | 3 refills | Status: DC

## 2016-05-30 NOTE — Progress Notes (Signed)
BP (!) 163/104   Pulse 87   Temp 98.1 F (36.7 C) (Oral)   Ht 5' 9"  (1.753 m)   Wt 231 lb (104.8 kg)   BMI 34.11 kg/m    Subjective:    Patient ID: Brad Mendoza, male    DOB: 1955-03-02, 61 y.o.   MRN: [id number]  HPI: Brad Mendoza is a 61 y.o. male presenting on 05/30/2016 for Diabetes (followup; patient is fasting; patient would like to be enrolled back into the [facility] program with [name]); Hyperlipidemia; and Hypertension   HPI Type 2 diabetes mellitus Patient comes in today for recheck of his diabetes. Patient has been currently taking Toujeo 70 units daily and Humalog 3 units once a day, his last hemoglobin A1c was greater than 14 3 months ago but at that time he was not taking his medications regularly. Patient is currently on an ACE inhibitor. Patient has not seen an ophthalmologist this year. Patient has known issues with his feet such as neuropathy and does take a medication for neuropathy. He says that his blood sugars in the early morning are running around 120 and in the afternoons are running around 140-150 pre-dinner and pre-lunch.  Hyperlipidemia Patient is coming in for recheck of his hyperlipidemia. He is currently taking Crestor and omega-3's. He denies any issues with myalgias or history of liver damage from it. He denies any focal numbness or weakness or chest pain today, he does have some intermittent chest pains that occur when he lays down sometimes in the evening but not in the past couple days. He does have a cardiologist and he will call to go back and see them.   Hypertension recheck Patient is coming in today for a blood pressure recheck. He is on 5 different medications for his blood pressure and is mostly being managed by cardiology. He is also been diagnosed with congestive heart failure and has peripheral edema and swelling secondary to this. His blood pressure today is 160/104 on initial examination 148/80 on second exam. He does admit that he did not  take his blood pressure pills this morning before coming but does usually take them every day. Patient denies headaches, blurred vision, chest pains, shortness of breath, or weakness. Denies any side effects from medication and is content with current medication.   Relevant past medical, surgical, family and social history reviewed and updated as indicated. Interim medical history since our last visit reviewed. Allergies and medications reviewed and updated.  Review of Systems  Constitutional: Negative for chills and fever.  Eyes: Negative for pain.  Respiratory: Negative for cough, shortness of breath and wheezing.   Cardiovascular: Positive for chest pain and leg swelling. Negative for palpitations.  Gastrointestinal: Negative for abdominal pain, blood in stool, constipation and diarrhea.  Genitourinary: Negative for dysuria and hematuria.  Musculoskeletal: Negative for back pain and myalgias.  Skin: Negative for rash.  Neurological: Negative for dizziness, weakness, light-headedness, numbness and headaches.  Psychiatric/Behavioral: Negative for suicidal ideas.    Per HPI unless specifically indicated above     Objective:    BP (!) 163/104   Pulse 87   Temp 98.1 F (36.7 C) (Oral)   Ht 5' 9"  (1.753 m)   Wt 231 lb (104.8 kg)   BMI 34.11 kg/m   Wt Readings from Last 3 Encounters:  05/30/16 231 lb (104.8 kg)  04/26/16 213 lb (96.6 kg)  03/29/16 210 lb (95.3 kg)    Physical Exam  Constitutional: He is oriented  to person, place, and time. He appears well-developed and well-nourished. No distress.  Eyes: Conjunctivae are normal. No scleral icterus.  Neck: Neck supple. No thyromegaly present.  Cardiovascular: Normal rate, regular rhythm, normal heart sounds and intact distal pulses.   No murmur heard. Pulmonary/Chest: Effort normal and breath sounds normal. No respiratory distress. He has no wheezes. He has no rales.  Musculoskeletal: Normal range of motion. He exhibits edema  (3+ edema).  Lymphadenopathy:    He has no cervical adenopathy.  Neurological: He is alert and oriented to person, place, and time. Coordination normal.  Skin: Skin is warm and dry. No rash noted. He is not diaphoretic.  Psychiatric: He has a normal mood and affect. His behavior is normal.  Nursing note and vitals reviewed.     Assessment & Plan:   Problem List Items Addressed This Visit      Cardiovascular and Mediastinum   Essential hypertension   Relevant Orders   CBC with Differential/Platelet   TSH     Endocrine   Type 2 diabetes mellitus with complications ([place]) - Primary   Relevant Medications   metFORMIN (GLUCOPHAGE) 500 MG tablet   Other Relevant Orders   CMP14+EGFR   Bayer DCA Hb A1c Waived   Type 2 diabetes mellitus with diabetic polyneuropathy (HCC)   Relevant Medications   metFORMIN (GLUCOPHAGE) 500 MG tablet   Other Relevant Orders   CMP14+EGFR   TSH   Bayer DCA Hb A1c Waived     Other   Hyperlipemia   Relevant Orders   Lipid panel   TSH   Obesity   Relevant Medications   metFORMIN (GLUCOPHAGE) 500 MG tablet   Other Relevant Orders   CBC with Differential/Platelet   TSH    Other Visit Diagnoses    Need for hepatitis C screening test       Relevant Orders   Hepatitis C antibody   Peripheral edema       Relevant Orders   DME Other see comment       Follow up plan: Return in about 3 months (around 08/29/2016), or if symptoms worsen or fail to improve, for Recheck diabetes and hypertension.  Counseling provided for all of the vaccine components Orders Placed This Encounter  Procedures  . DME Other see comment  . CMP14+EGFR  . Lipid panel  . CBC with Differential/Platelet  . TSH  . Bayer DCA Hb A1c Waived  . Hepatitis C antibody    Caryl Pina, MD [facility] 05/30/2016, 10:45 AM

## 2016-05-31 LAB — CBC WITH DIFFERENTIAL/PLATELET
BASOS ABS: 0.1 10*3/uL (ref 0.0–0.2)
Basos: 1 %
EOS (ABSOLUTE): 0.3 10*3/uL (ref 0.0–0.4)
Eos: 3 %
HEMOGLOBIN: 11.9 g/dL — AB (ref 13.0–17.7)
Hematocrit: 36.9 % — ABNORMAL LOW (ref 37.5–51.0)
Immature Grans (Abs): 0 10*3/uL (ref 0.0–0.1)
Immature Granulocytes: 0 %
LYMPHS ABS: 1.8 10*3/uL (ref 0.7–3.1)
Lymphs: 24 %
MCH: 26.5 pg — AB (ref 26.6–33.0)
MCHC: 32.2 g/dL (ref 31.5–35.7)
MCV: 82 fL (ref 79–97)
Monocytes Absolute: 0.9 10*3/uL (ref 0.1–0.9)
Monocytes: 12 %
NEUTROS ABS: 4.3 10*3/uL (ref 1.4–7.0)
Neutrophils: 60 %
PLATELETS: 287 10*3/uL (ref 150–379)
RBC: 4.49 x10E6/uL (ref 4.14–5.80)
RDW: 14.9 % (ref 12.3–15.4)
WBC: 7.3 10*3/uL (ref 3.4–10.8)

## 2016-05-31 LAB — CMP14+EGFR
A/G RATIO: 1.3 (ref 1.2–2.2)
ALBUMIN: 2.9 g/dL — AB (ref 3.6–4.8)
ALK PHOS: 86 IU/L (ref 39–117)
ALT: 29 IU/L (ref 0–44)
AST: 31 IU/L (ref 0–40)
BILIRUBIN TOTAL: 0.2 mg/dL (ref 0.0–1.2)
BUN / CREAT RATIO: 22 (ref 10–24)
BUN: 26 mg/dL (ref 8–27)
CHLORIDE: 103 mmol/L (ref 96–106)
CO2: 23 mmol/L (ref 18–29)
Calcium: 8.3 mg/dL — ABNORMAL LOW (ref 8.6–10.2)
Creatinine, Ser: 1.19 mg/dL (ref 0.76–1.27)
GFR calc non Af Amer: 66 mL/min/{1.73_m2} (ref >59)
GFR, EST AFRICAN AMERICAN: 76 mL/min/{1.73_m2} (ref >59)
GLOBULIN, TOTAL: 2.2 g/dL (ref 1.5–4.5)
GLUCOSE: 47 mg/dL — AB (ref 65–99)
POTASSIUM: 4.2 mmol/L (ref 3.5–5.2)
SODIUM: 142 mmol/L (ref 134–144)
Total Protein: 5.1 g/dL — ABNORMAL LOW (ref 6.0–8.5)

## 2016-05-31 LAB — HEPATITIS C ANTIBODY: Hep C Virus Ab: <0.1 s/co ratio (ref 0.0–0.9)

## 2016-05-31 LAB — TSH: TSH: 1.79 u[IU]/mL (ref 0.450–4.500)

## 2016-05-31 LAB — LIPID PANEL
CHOL/HDL RATIO: 3.3 ratio (ref 0.0–5.0)
Cholesterol, Total: 215 mg/dL — ABNORMAL HIGH (ref 100–199)
HDL: 66 mg/dL (ref >39)
LDL Calculated: 133 mg/dL — ABNORMAL HIGH (ref 0–99)
Triglycerides: 82 mg/dL (ref 0–149)
VLDL CHOLESTEROL CAL: 16 mg/dL (ref 5–40)

## 2016-06-01 ENCOUNTER — Encounter: Payer: Self-pay | Admitting: Pediatrics

## 2016-06-01 ENCOUNTER — Telehealth: Payer: Self-pay | Admitting: Cardiovascular Disease

## 2016-06-01 ENCOUNTER — Ambulatory Visit (INDEPENDENT_AMBULATORY_CARE_PROVIDER_SITE_OTHER): Payer: Medicare Other

## 2016-06-01 ENCOUNTER — Emergency Department (HOSPITAL_COMMUNITY): Payer: Medicare Other

## 2016-06-01 ENCOUNTER — Inpatient Hospital Stay (HOSPITAL_COMMUNITY)
Admission: EM | Admit: 2016-06-01 | Discharge: 2016-06-08 | DRG: 286 | Disposition: A | Discharge status: Home / Self Care | Payer: Medicare Other | Attending: Internal Medicine | Admitting: Internal Medicine

## 2016-06-01 ENCOUNTER — Telehealth: Payer: Self-pay

## 2016-06-01 ENCOUNTER — Ambulatory Visit (INDEPENDENT_AMBULATORY_CARE_PROVIDER_SITE_OTHER): Payer: Medicare Other | Admitting: Pediatrics

## 2016-06-01 ENCOUNTER — Encounter (HOSPITAL_COMMUNITY): Payer: Self-pay | Admitting: *Deleted

## 2016-06-01 VITALS — BP 116/74 | HR 81 | Temp 98.1°F

## 2016-06-01 DIAGNOSIS — I209 Angina pectoris, unspecified: Secondary | ICD-10-CM | POA: Diagnosis not present

## 2016-06-01 DIAGNOSIS — I130 Hypertensive heart and chronic kidney disease with heart failure and stage 1 through stage 4 chronic kidney disease, or unspecified chronic kidney disease: Secondary | ICD-10-CM | POA: Diagnosis present

## 2016-06-01 DIAGNOSIS — Z833 Family history of diabetes mellitus: Secondary | ICD-10-CM | POA: Diagnosis not present

## 2016-06-01 DIAGNOSIS — Z882 Allergy status to sulfonamides status: Secondary | ICD-10-CM

## 2016-06-01 DIAGNOSIS — F17290 Nicotine dependence, other tobacco product, uncomplicated: Secondary | ICD-10-CM | POA: Diagnosis present

## 2016-06-01 DIAGNOSIS — Z9114 Patient's other noncompliance with medication regimen: Secondary | ICD-10-CM

## 2016-06-01 DIAGNOSIS — I428 Other cardiomyopathies: Secondary | ICD-10-CM | POA: Diagnosis not present

## 2016-06-01 DIAGNOSIS — I25708 Atherosclerosis of coronary artery bypass graft(s), unspecified, with other forms of angina pectoris: Secondary | ICD-10-CM | POA: Diagnosis not present

## 2016-06-01 DIAGNOSIS — Z7982 Long term (current) use of aspirin: Secondary | ICD-10-CM | POA: Diagnosis not present

## 2016-06-01 DIAGNOSIS — I10 Essential (primary) hypertension: Secondary | ICD-10-CM | POA: Diagnosis present

## 2016-06-01 DIAGNOSIS — Z7951 Long term (current) use of inhaled steroids: Secondary | ICD-10-CM

## 2016-06-01 DIAGNOSIS — R05 Cough: Secondary | ICD-10-CM | POA: Diagnosis not present

## 2016-06-01 DIAGNOSIS — Z880 Allergy status to penicillin: Secondary | ICD-10-CM | POA: Diagnosis not present

## 2016-06-01 DIAGNOSIS — I2510 Atherosclerotic heart disease of native coronary artery without angina pectoris: Secondary | ICD-10-CM | POA: Diagnosis not present

## 2016-06-01 DIAGNOSIS — I472 Ventricular tachycardia: Secondary | ICD-10-CM | POA: Diagnosis not present

## 2016-06-01 DIAGNOSIS — I509 Heart failure, unspecified: Secondary | ICD-10-CM | POA: Diagnosis not present

## 2016-06-01 DIAGNOSIS — I5022 Chronic systolic (congestive) heart failure: Secondary | ICD-10-CM | POA: Diagnosis not present

## 2016-06-01 DIAGNOSIS — I25110 Atherosclerotic heart disease of native coronary artery with unstable angina pectoris: Principal | ICD-10-CM | POA: Diagnosis present

## 2016-06-01 DIAGNOSIS — R079 Chest pain, unspecified: Secondary | ICD-10-CM | POA: Diagnosis not present

## 2016-06-01 DIAGNOSIS — E7800 Pure hypercholesterolemia, unspecified: Secondary | ICD-10-CM | POA: Diagnosis not present

## 2016-06-01 DIAGNOSIS — N182 Chronic kidney disease, stage 2 (mild): Secondary | ICD-10-CM | POA: Diagnosis not present

## 2016-06-01 DIAGNOSIS — Z794 Long term (current) use of insulin: Secondary | ICD-10-CM | POA: Diagnosis not present

## 2016-06-01 DIAGNOSIS — E118 Type 2 diabetes mellitus with unspecified complications: Secondary | ICD-10-CM | POA: Diagnosis not present

## 2016-06-01 DIAGNOSIS — Z95810 Presence of automatic (implantable) cardiac defibrillator: Secondary | ICD-10-CM

## 2016-06-01 DIAGNOSIS — F17200 Nicotine dependence, unspecified, uncomplicated: Secondary | ICD-10-CM | POA: Diagnosis not present

## 2016-06-01 DIAGNOSIS — K219 Gastro-esophageal reflux disease without esophagitis: Secondary | ICD-10-CM | POA: Diagnosis present

## 2016-06-01 DIAGNOSIS — Z8249 Family history of ischemic heart disease and other diseases of the circulatory system: Secondary | ICD-10-CM

## 2016-06-01 DIAGNOSIS — R0602 Shortness of breath: Secondary | ICD-10-CM

## 2016-06-01 DIAGNOSIS — E11319 Type 2 diabetes mellitus with unspecified diabetic retinopathy without macular edema: Secondary | ICD-10-CM | POA: Diagnosis present

## 2016-06-01 DIAGNOSIS — N179 Acute kidney failure, unspecified: Secondary | ICD-10-CM | POA: Diagnosis not present

## 2016-06-01 DIAGNOSIS — I110 Hypertensive heart disease with heart failure: Secondary | ICD-10-CM | POA: Diagnosis not present

## 2016-06-01 DIAGNOSIS — E1122 Type 2 diabetes mellitus with diabetic chronic kidney disease: Secondary | ICD-10-CM | POA: Diagnosis present

## 2016-06-01 DIAGNOSIS — E11649 Type 2 diabetes mellitus with hypoglycemia without coma: Secondary | ICD-10-CM | POA: Diagnosis present

## 2016-06-01 DIAGNOSIS — R0789 Other chest pain: Secondary | ICD-10-CM | POA: Diagnosis not present

## 2016-06-01 DIAGNOSIS — I5023 Acute on chronic systolic (congestive) heart failure: Secondary | ICD-10-CM | POA: Diagnosis not present

## 2016-06-01 DIAGNOSIS — Z79899 Other long term (current) drug therapy: Secondary | ICD-10-CM

## 2016-06-01 DIAGNOSIS — I5043 Acute on chronic combined systolic (congestive) and diastolic (congestive) heart failure: Secondary | ICD-10-CM | POA: Diagnosis present

## 2016-06-01 DIAGNOSIS — I200 Unstable angina: Secondary | ICD-10-CM | POA: Diagnosis not present

## 2016-06-01 DIAGNOSIS — E785 Hyperlipidemia, unspecified: Secondary | ICD-10-CM | POA: Diagnosis present

## 2016-06-01 DIAGNOSIS — I272 Pulmonary hypertension, unspecified: Secondary | ICD-10-CM | POA: Diagnosis not present

## 2016-06-01 DIAGNOSIS — G479 Sleep disorder, unspecified: Secondary | ICD-10-CM | POA: Diagnosis not present

## 2016-06-01 DIAGNOSIS — I255 Ischemic cardiomyopathy: Secondary | ICD-10-CM

## 2016-06-01 HISTORY — DX: Other cardiomyopathies: I42.8

## 2016-06-01 HISTORY — DX: Atherosclerotic heart disease of native coronary artery without angina pectoris: I25.10

## 2016-06-01 HISTORY — DX: Other psychoactive substance abuse, uncomplicated: F19.10

## 2016-06-01 LAB — TROPONIN I: Troponin I: <0.03 ng/mL (ref <0.03)

## 2016-06-01 LAB — CBC WITH DIFFERENTIAL/PLATELET
BASOS ABS: 0 10*3/uL (ref 0.0–0.1)
Basophils Relative: 1 %
EOS ABS: 0.4 10*3/uL (ref 0.0–0.7)
Eosinophils Relative: 6 %
HCT: 32.9 % — ABNORMAL LOW (ref 39.0–52.0)
HEMOGLOBIN: 10.5 g/dL — AB (ref 13.0–17.0)
LYMPHS PCT: 26 %
Lymphs Abs: 1.6 10*3/uL (ref 0.7–4.0)
MCH: 25.9 pg — ABNORMAL LOW (ref 26.0–34.0)
MCHC: 31.9 g/dL (ref 30.0–36.0)
MCV: 81.2 fL (ref 78.0–100.0)
Monocytes Absolute: 0.6 10*3/uL (ref 0.1–1.0)
Monocytes Relative: 9 %
NEUTROS PCT: 60 %
Neutro Abs: 3.8 10*3/uL (ref 1.7–7.7)
Platelets: 229 10*3/uL (ref 150–400)
RBC: 4.05 MIL/uL — AB (ref 4.22–5.81)
RDW: 13.9 % (ref 11.5–15.5)
WBC: 6.4 10*3/uL (ref 4.0–10.5)

## 2016-06-01 LAB — BASIC METABOLIC PANEL
ANION GAP: 5 (ref 5–15)
BUN: 27 mg/dL — ABNORMAL HIGH (ref 6–20)
CO2: 28 mmol/L (ref 22–32)
CREATININE: 1.57 mg/dL — AB (ref 0.61–1.24)
Calcium: 8.3 mg/dL — ABNORMAL LOW (ref 8.9–10.3)
Chloride: 107 mmol/L (ref 101–111)
GFR calc non Af Amer: 46 mL/min — ABNORMAL LOW (ref >60)
GFR, EST AFRICAN AMERICAN: 54 mL/min — AB (ref >60)
Glucose, Bld: 181 mg/dL — ABNORMAL HIGH (ref 65–99)
Potassium: 4.5 mmol/L (ref 3.5–5.1)
SODIUM: 140 mmol/L (ref 135–145)

## 2016-06-01 LAB — CBC
HCT: 33.3 % — ABNORMAL LOW (ref 39.0–52.0)
HEMOGLOBIN: 11 g/dL — AB (ref 13.0–17.0)
MCH: 27.1 pg (ref 26.0–34.0)
MCHC: 33 g/dL (ref 30.0–36.0)
MCV: 82 fL (ref 78.0–100.0)
PLATELETS: 236 10*3/uL (ref 150–400)
RBC: 4.06 MIL/uL — AB (ref 4.22–5.81)
RDW: 13.9 % (ref 11.5–15.5)
WBC: 5.8 10*3/uL (ref 4.0–10.5)

## 2016-06-01 LAB — BRAIN NATRIURETIC PEPTIDE: B Natriuretic Peptide: 240 pg/mL — ABNORMAL HIGH (ref 0.0–100.0)

## 2016-06-01 LAB — GLUCOSE, CAPILLARY: GLUCOSE-CAPILLARY: 226 mg/dL — AB (ref 65–99)

## 2016-06-01 LAB — TSH: TSH: 3.282 u[IU]/mL (ref 0.350–4.500)

## 2016-06-01 MED ORDER — SODIUM CHLORIDE 0.9 % IV SOLN: 250.0000 mL | INTRAVENOUS | Status: DC | PRN

## 2016-06-01 MED ORDER — NICOTINE 14 MG/24HR TD PT24
14.0000 mg | MEDICATED_PATCH | Freq: Every day | TRANSDERMAL | Status: DC
  Administered 2016-06-02 – 2016-06-08 (×8): 14 mg via TRANSDERMAL
  Filled 2016-06-01 (×7): qty 1

## 2016-06-01 MED ORDER — ACETAMINOPHEN 325 MG PO TABS
650.0000 mg | ORAL_TABLET | ORAL | Status: DC | PRN
  Administered 2016-06-02: 650 mg via ORAL
  Filled 2016-06-01: qty 2

## 2016-06-01 MED ORDER — SODIUM CHLORIDE 0.9% FLUSH
3.0000 mL | Freq: Two times a day (BID) | INTRAVENOUS | Status: DC
  Administered 2016-06-01 – 2016-06-08 (×8): 3 mL via INTRAVENOUS

## 2016-06-01 MED ORDER — OMEGA-3-ACID ETHYL ESTERS 1 G PO CAPS
1.0000 g | ORAL_CAPSULE | Freq: Every day | ORAL | Status: DC
  Administered 2016-06-01 – 2016-06-08 (×8): 1 g via ORAL
  Filled 2016-06-01 (×8): qty 1

## 2016-06-01 MED ORDER — SODIUM CHLORIDE 0.9% FLUSH: 3.0000 mL | INTRAVENOUS | Status: DC | PRN

## 2016-06-01 MED ORDER — ASPIRIN EC 81 MG PO TBEC
81.0000 mg | DELAYED_RELEASE_TABLET | Freq: Every day | ORAL | Status: DC
  Administered 2016-06-02 – 2016-06-08 (×7): 81 mg via ORAL
  Filled 2016-06-01 (×7): qty 1

## 2016-06-01 MED ORDER — PANTOPRAZOLE SODIUM 40 MG PO TBEC
40.0000 mg | DELAYED_RELEASE_TABLET | Freq: Every day | ORAL | Status: DC
  Administered 2016-06-02 – 2016-06-08 (×7): 40 mg via ORAL
  Filled 2016-06-01 (×7): qty 1

## 2016-06-01 MED ORDER — ASPIRIN 81 MG PO CHEW
324.0000 mg | CHEWABLE_TABLET | Freq: Once | ORAL | Status: AC
  Administered 2016-06-01: 324 mg via ORAL
  Filled 2016-06-01: qty 4

## 2016-06-01 MED ORDER — ROSUVASTATIN CALCIUM 10 MG PO TABS: 10.0000 mg | ORAL_TABLET | Freq: Every day | ORAL | Status: DC

## 2016-06-01 MED ORDER — ENOXAPARIN SODIUM 40 MG/0.4ML ~~LOC~~ SOLN
40.0000 mg | SUBCUTANEOUS | Status: DC
  Administered 2016-06-01 – 2016-06-02 (×2): 40 mg via SUBCUTANEOUS
  Filled 2016-06-01 (×2): qty 0.4

## 2016-06-01 MED ORDER — FUROSEMIDE 10 MG/ML IJ SOLN
60.0000 mg | Freq: Two times a day (BID) | INTRAMUSCULAR | Status: DC
  Administered 2016-06-02 – 2016-06-06 (×8): 60 mg via INTRAVENOUS
  Filled 2016-06-01 (×8): qty 6

## 2016-06-01 MED ORDER — GABAPENTIN 300 MG PO CAPS
300.0000 mg | ORAL_CAPSULE | Freq: Every day | ORAL | Status: DC
  Administered 2016-06-01 – 2016-06-07 (×7): 300 mg via ORAL
  Filled 2016-06-01 (×7): qty 1

## 2016-06-01 MED ORDER — INSULIN GLARGINE 100 UNIT/ML ~~LOC~~ SOLN
40.0000 [IU] | Freq: Every day | SUBCUTANEOUS | Status: DC
  Administered 2016-06-01 – 2016-06-07 (×7): 40 [IU] via SUBCUTANEOUS
  Filled 2016-06-01 (×7): qty 0.4

## 2016-06-01 MED ORDER — INSULIN ASPART 100 UNIT/ML ~~LOC~~ SOLN
0.0000 [IU] | Freq: Every day | SUBCUTANEOUS | Status: DC
Start: 2016-06-01 — End: 2016-06-08
  Administered 2016-06-01 – 2016-06-05 (×3): 2 [IU] via SUBCUTANEOUS

## 2016-06-01 MED ORDER — ALBUTEROL SULFATE (2.5 MG/3ML) 0.083% IN NEBU: 3.0000 mL | INHALATION_SOLUTION | Freq: Four times a day (QID) | RESPIRATORY_TRACT | Status: DC | PRN

## 2016-06-01 MED ORDER — LISINOPRIL 40 MG PO TABS
40.0000 mg | ORAL_TABLET | Freq: Every day | ORAL | Status: DC
  Administered 2016-06-02 – 2016-06-06 (×5): 40 mg via ORAL
  Filled 2016-06-01 (×5): qty 1

## 2016-06-01 MED ORDER — FLUTICASONE PROPIONATE 50 MCG/ACT NA SUSP
2.0000 | Freq: Every day | NASAL | Status: DC
  Administered 2016-06-01 – 2016-06-08 (×8): 2 via NASAL
  Filled 2016-06-01: qty 16

## 2016-06-01 MED ORDER — CARVEDILOL 12.5 MG PO TABS
12.5000 mg | ORAL_TABLET | Freq: Two times a day (BID) | ORAL | Status: DC
  Administered 2016-06-02 – 2016-06-08 (×13): 12.5 mg via ORAL
  Filled 2016-06-01 (×13): qty 1

## 2016-06-01 MED ORDER — NITROGLYCERIN 0.4 MG SL SUBL
0.4000 mg | SUBLINGUAL_TABLET | SUBLINGUAL | Status: AC | PRN
  Administered 2016-06-01 (×3): 0.4 mg via SUBLINGUAL
  Filled 2016-06-01: qty 1

## 2016-06-01 MED ORDER — SPIRONOLACTONE 25 MG PO TABS
12.5000 mg | ORAL_TABLET | Freq: Every day | ORAL | Status: DC
  Administered 2016-06-02: 12.5 mg via ORAL
  Filled 2016-06-01: qty 1

## 2016-06-01 MED ORDER — ROSUVASTATIN CALCIUM 10 MG PO TABS
20.0000 mg | ORAL_TABLET | Freq: Every day | ORAL | Status: DC
  Administered 2016-06-02 – 2016-06-07 (×6): 20 mg via ORAL
  Filled 2016-06-01 (×6): qty 2

## 2016-06-01 MED ORDER — ISOSORBIDE MONONITRATE ER 60 MG PO TB24
90.0000 mg | ORAL_TABLET | Freq: Every day | ORAL | Status: DC
  Administered 2016-06-02 – 2016-06-08 (×7): 90 mg via ORAL
  Filled 2016-06-01 (×7): qty 1

## 2016-06-01 MED ORDER — ONDANSETRON HCL 4 MG/2ML IJ SOLN: 4.0000 mg | Freq: Four times a day (QID) | INTRAMUSCULAR | Status: DC | PRN

## 2016-06-01 MED ORDER — FUROSEMIDE 10 MG/ML IJ SOLN
40.0000 mg | Freq: Once | INTRAMUSCULAR | Status: AC
  Administered 2016-06-01: 40 mg via INTRAVENOUS
  Filled 2016-06-01: qty 4

## 2016-06-01 MED ORDER — INSULIN ASPART 100 UNIT/ML ~~LOC~~ SOLN
0.0000 [IU] | Freq: Three times a day (TID) | SUBCUTANEOUS | Status: DC
  Administered 2016-06-02: 2 [IU] via SUBCUTANEOUS
  Administered 2016-06-02: 3 [IU] via SUBCUTANEOUS
  Administered 2016-06-03 – 2016-06-04 (×2): 2 [IU] via SUBCUTANEOUS
  Administered 2016-06-05: 5 [IU] via SUBCUTANEOUS
  Administered 2016-06-05 – 2016-06-08 (×6): 2 [IU] via SUBCUTANEOUS

## 2016-06-01 MED ORDER — INSULIN GLARGINE 300 UNIT/ML ~~LOC~~ SOPN: 70.0000 [IU] | PEN_INJECTOR | Freq: Every day | SUBCUTANEOUS | Status: DC

## 2016-06-01 MED ORDER — LORAZEPAM 0.5 MG PO TABS: 0.5000 mg | ORAL_TABLET | Freq: Every day | ORAL | Status: DC

## 2016-06-01 MED ORDER — NITROGLYCERIN 0.4 MG SL SUBL: 0.4000 mg | SUBLINGUAL_TABLET | SUBLINGUAL | Status: DC | PRN

## 2016-06-01 NOTE — Progress Notes (Signed)
  Subjective:   Patient ID: Brad Mendoza, male    DOB: 01-30-1956, 61 y.o.   MRN: [id number] CC: Chest Pain (with SOB, started yesterday, worse w/exertion)  HPI: Brad Mendoza is a 61 y.o. male presenting for Chest Pain (with SOB, started yesterday, worse w/exertion)  Ongoing for 2-3 days more consistently, off and on for the past two weeks Had a "thump" or pain in his chest, took a nitroglycerin and it went away Has had a heavy sensation on L side of chest that comes and goes Sometimes will go down his back, a few times has gone down his L arm Tightness worse when lying down flat Also ongoing SOB gets worse with walking, ongoing for 2-3 days Tried inhaler, didn't help Walked 1/2 mile yesterday, had to stop 4 times to catch his breath No chest pain or pressure with walking Feels very tired, can't get his breath Sitting in clinic has "chest tightness", doesn't feel right Feels different than his reflux, hasnt felt like this before No nausea Up all night with discomfort in his chest, feeling SOB  Takes medications regularly Has had increased swelling in LE for last 2-3 days Increased salt about 2 weeks ago, says he drank 1.5 gallons of water yesterday because he took the fluid pill  He says he takes all of his medications daily, when here two days ago in clinic he didn't take his meds that morning but that is unusual for him  No fevers, no coughing  Relevant past medical, surgical, family and social history reviewed. Allergies and medications reviewed and updated. History  Smoking Status  . Never Smoker  Smokeless Tobacco  . Current User  . Types: Snuff    Comment: dips snuff about 6 cans/week for 3 years   ROS: Per HPI   Objective:    BP 116/74 (BP Location: Left Arm, Patient Position: Sitting, Cuff Size: Normal)   Pulse 81   Temp 98.1 F (36.7 C) (Oral)   SpO2 98%   Wt Readings from Last 3 Encounters:  05/30/16 231 lb (104.8 kg)  04/26/16 213 lb (96.6 kg)    03/29/16 210 lb (95.3 kg)    Gen: NAD, alert, cooperative with exam, NCAT EYES: EOMI, no conjunctival injection, or no icterus ENT: OP without erythema LYMPH: no cervical LAD CV: NRRR, normal S1/S2, no murmur, distal pulses 2+ b/l, no JVD Resp: CTABL, no wheezes, normal WOB Abd: +BS, soft, NTND. no guarding or organomegaly Ext: 3+ pitting edema b/l LE, warm Neuro: Alert and oriented, strength equal b/l UE and LE, coordination grossly normal MSK: no chest wall tenderness  EKG: nsr, similar to prior  Prelim read by Assunta Found, MD: CXR: with some hazy infiltrates b/l  Assessment & Plan:  Brad Mendoza was seen today for chest tightness, orthopnea Ongoing off and on Chest tightness now, feeling SOB Increased swelling in his legs Has taken nitroglycerin, eases chest pressure and pain. No EKG changes  Concern for CHF exacerbation, needs ACS rule out. H/o NICM CHF, EF 35% EMS transporting to ED  Diagnoses and all orders for this visit:  SOB (shortness of breath) -     DG Chest 2 View; Future  Chest pain in adult -     EKG 12-Lead   Follow up plan: As needed Assunta Found, MD [place]

## 2016-06-01 NOTE — Telephone Encounter (Signed)
Appointment scheduled.

## 2016-06-01 NOTE — ED Notes (Signed)
Dinner tray given

## 2016-06-01 NOTE — Telephone Encounter (Signed)
Patient c/o for the past 3 days, he's had chest pain/tightness rated 8/10 and worsening SOB. Patient has been using nitroglycerin x's one each time for the past 3 days with relief. Patient also c/o swelling in both legs. Patient denies dizziness, n/v, fever, congestion, coughing. Patient said he was currently being evaluated by his PCP and was getting an EKG. Patient advised that he should continue with that evaluation today and or go to the ED for an evaluation but this should be based on his PCP's observations today. Patient advised that if his PCP felt he needed an appointment with the cardiologist soon that his PCP's office should contact us to make an appointment. Patient verbalized understanding of plan.

## 2016-06-01 NOTE — ED Provider Notes (Signed)
[facility] DEPT Provider Note   CSN: [id number] Arrival date & time: 06/01/16  1558     History   Chief Complaint Chief Complaint  Patient presents with  . Shortness of Breath  . Chest Pain    HPI Brad Mendoza is a 61 y.o. male.  HPI  Pt was seen at 77. Per pt, c/o gradual onset and worsening of multiple intermittent episodes of chest "pain" for the past 2  Weeks, worse over the past 2 to 3 days. Describes the CP as "tightness," and "heaviness," occasionally radiating into his left arm, lasting approximately 10 minutes each episode. Has been associated with worsening SOB, esp on exertion and laying down, as well as increasing pedal edema and generalized fatigue. Pt occasionally has been taking his own SL ntg with improvement of CP. Pt was evaluated by his PMD today, then sent to the ED for further evaluation/admission. Denies palpitations, no cough, no back pain, no abd pain, no N/V/D.   Past Medical History:  Diagnosis Date  . Acid reflux   . Back pain    f4 and f5  . CAD (coronary artery disease)   . CHF (congestive heart failure) ([place])    last echo was in March 2016  . CHF (congestive heart failure) ([place])   . Diabetes mellitus 2005  . Diverticulosis   . H/O chest pain 2005   ECHO  . HTN (hypertension)   . Hyperlipidemia   . Nonischemic cardiomyopathy ([place])   . Onychomycosis   . Polysubstance abuse     Patient Active Problem List   Diagnosis Date Noted  . CHF exacerbation ([place]) 11/17/2015  . Type 2 diabetes mellitus with hyperosmolar nonketotic hyperglycemia ([place]) 05/08/2015  . Hyponatremia 05/08/2015  . Hypochloremia 05/08/2015  . Type 2 diabetes mellitus with diabetic polyneuropathy ([place]) 12/16/2013  . CAD (coronary artery disease) 09/18/2013  . Heme positive stool 06/12/2013  . Seasonal allergic rhinitis 07/11/2012  . Polysubstance abuse 12/24/2010  . Onychomycosis   . Acid reflux   . Nonischemic cardiomyopathy ([place])   . Type 2 diabetes mellitus  with complications ([place])   . Hyperlipemia 11/16/2009  . Obesity 11/16/2009  . Tobacco dependence 11/16/2009  . Essential hypertension 11/16/2009    Past Surgical History:  Procedure Laterality Date  . APPENDECTOMY    . CARDIAC CATHETERIZATION  2005   4 frech cath  . COLONOSCOPY N/A 08/01/2013   Procedure: COLONOSCOPY;  Surgeon: Rogene Houston, MD;  Location: [facility];  Service: Endoscopy;  Laterality: N/A;  rescheduled to [place] notified pt  . ESOPHAGOGASTRODUODENOSCOPY N/A 04/23/2015   Procedure: ESOPHAGOGASTRODUODENOSCOPY (EGD);  Surgeon: Rogene Houston, MD;  Location: [facility] ENDO SUITE;  Service: Endoscopy;  Laterality: N/A;  1:25       Home Medications    Prior to Admission medications   Medication Sig Start Date End Date Taking? Authorizing Provider  albuterol (PROVENTIL HFA;VENTOLIN HFA) 108 (90 Base) MCG/ACT inhaler Inhale 2 puffs into the lungs every 6 (six) hours as needed for wheezing or shortness of breath. 05/21/15   [name], MD  amLODipine (NORVASC) 5 MG tablet Take 1 tablet (5 mg total) by mouth daily. Patient taking differently: Take 2.5 mg by mouth daily. 1/2 tablet daily 02/04/16 06/01/16  Herminio Commons, MD  aspirin EC 81 MG tablet Take 81 mg by mouth daily.    Historical Provider, MD  Capsaicin (ICY HOT ARTHRITIS THERAPY EX) Apply 1 application topically 2 (two) times daily as needed (back pain).  Historical Provider, MD  carvedilol (COREG) 12.5 MG tablet TAKE ONE TABLET TWICE A DAY WITH FOOD 05/24/16   Chipper Herb, MD  fluticasone [facility]) 50 MCG/ACT nasal spray USE 2 SPRAYS IN [facility] NOSTRIL DAILY 05/24/16   Chipper Herb, MD  furosemide (LASIX) 40 MG tablet Take 1.5 tablets (60 mg total) by mouth daily. 10/30/15 06/01/16  Lendon Colonel, NP  gabapentin (NEURONTIN) 300 MG capsule Take 1 capsule (300 mg total) by mouth at bedtime. 03/28/16   [name], MD  HUMALOG KWIKPEN 100 UNIT/ML KiwkPen INJECT UP TO 15 UNITS PRIOR TO EACH MEALAS NEEDED  FOR ELAVATED BG Patient taking differently: patient using a sliding scale 03/30/16   [name], MD  Insulin Glargine (TOUJEO SOLOSTAR) 300 UNIT/ML SOPN Inject 70-90 Units into the skin daily. 05/24/16   Fransisca Kaufmann Dettinger, MD  isosorbide mononitrate (IMDUR) 60 MG 24 hr tablet TAKE 1 AND 1/2 TABLETS DAILY 05/24/16   Chipper Herb, MD  lisinopril (PRINIVIL,ZESTRIL) 40 MG tablet TAKE ONE (1) TABLET EACH DAY 05/24/16   Chipper Herb, MD  LORazepam (ATIVAN) 0.5 MG tablet Take 1 tablet (0.5 mg total) by mouth at bedtime. 03/15/16   [name], MD  metFORMIN (GLUCOPHAGE) 500 MG tablet Take 1 tablet (500 mg total) by mouth 2 (two) times daily with a meal. 05/30/16   Fransisca Kaufmann Dettinger, MD  Multiple Vitamins-Minerals (CENTRUM SILVER ADULT 50+ PO) Take 1 tablet by mouth daily.    Historical Provider, MD  nitroGLYCERIN (NITROSTAT) 0.4 MG SL tablet DISSOLVE 1 TAB UNDER TOUNGE FOR CHEST PAIN. MAY REPEAT EVERY 5 MINUTES FOR 3 DOSES. IF NO RELIEF CALL 911 OR GO TO ER 12/10/15   [name], MD  Omega-3 Fatty Acids (FISH OIL) 1000 MG CPDR Take 1 capsule by mouth daily.    Historical Provider, MD  pantoprazole (PROTONIX) 40 MG tablet TAKE ONE (1) TABLET EACH DAY 05/24/16   Chipper Herb, MD  rosuvastatin (CRESTOR) 10 MG tablet TAKE ONE (1) TABLET EACH DAY 05/24/16   Chipper Herb, MD  spironolactone (ALDACTONE) 25 MG tablet TAKE 1/2 TABLET DAILY 05/24/16   Chipper Herb, MD  terbinafine (LAMISIL) 1 % cream Apply 1 application topically 2 (two) times daily. Apply to both feet and between toes 03/28/16   Cherre Robins, PharmD  ULTICARE MINI PEN NEEDLES 31G X 6 MM MISC 4 TIMES DAILY 05/24/16   Chipper Herb, MD    Family History Family History  Problem Relation Age of Onset  . Hypertension Father   . Diabetes Father   . Cancer Mother   . Alcohol abuse Brother   . Colon cancer Neg Hx     Social History Social History  Substance Use Topics  . Smoking status: Never Smoker  . Smokeless tobacco: Current User     Types: Snuff     Comment: dips snuff about 6 cans/week for 3 years  . Alcohol use 0.6 oz/week    1 Cans of beer per week     Comment: occ      Allergies   Sulfa antibiotics and Penicillins   Review of Systems Review of Systems ROS: Statement: All systems negative except as marked or noted in the HPI; Constitutional: Negative for fever and chills. ; ; Eyes: Negative for eye pain, redness and discharge. ; ; ENMT: Negative for ear pain, hoarseness, nasal congestion, sinus pressure and sore throat. ; ; Cardiovascular: Negative for palpitations, diaphoresis, +CP, dyspnea and peripheral  edema. ; ; Respiratory: Negative for cough, wheezing and stridor. ; ; Gastrointestinal: Negative for nausea, vomiting, diarrhea, abdominal pain, blood in stool, hematemesis, jaundice and rectal bleeding. . ; ; Genitourinary: Negative for dysuria, flank pain and hematuria. ; ; Musculoskeletal: Negative for back pain and neck pain. Negative for swelling and trauma.; ; Skin: Negative for pruritus, rash, abrasions, blisters, bruising and skin lesion.; ; Neuro: Negative for headache, lightheadedness and neck stiffness. Negative for weakness, altered level of consciousness, altered mental status, extremity weakness, paresthesias, involuntary movement, seizure and syncope.       Physical Exam Updated Vital Signs BP (!) 144/87   Pulse 82   Temp 98 F (36.7 C) (Oral)   Resp (!) 25   Ht 5\' 9"  (1.753 m)   Wt 230 lb (104.3 kg)   SpO2 97%   BMI 33.97 kg/m   Physical Exam 1630: Physical examination:  Nursing notes reviewed; Vital signs and O2 SAT reviewed;  Constitutional: Well developed, Well nourished, Well hydrated, In no acute distress; Head:  Normocephalic, atraumatic; Eyes: EOMI, PERRL, No scleral icterus; ENMT: Mouth and pharynx normal, Mucous membranes moist; Neck: Supple, Full range of motion, No lymphadenopathy; Cardiovascular: Regular rate and rhythm, No gallop; Respiratory: Breath sounds coarse & equal  bilaterally, No wheezes.  Speaking full sentences with ease, Normal respiratory effort/excursion; Chest: Nontender, Movement normal; Abdomen: Soft, Nontender, Nondistended, Normal bowel sounds; Genitourinary: No CVA tenderness; Extremities: Pulses normal, No tenderness, +2-3 pedal edema bilat. No calf asymmetry.; Neuro: AA&Ox3, Major CN grossly intact.  Speech clear. No gross focal motor or sensory deficits in extremities.; Skin: Color normal, Warm, Dry.   ED Treatments / Results  Labs (all labs ordered are listed, but only abnormal results are displayed)   EKG  EKG Interpretation  Date/Time:  Wednesday June 01 2016 16:05:31 EDT Ventricular Rate:  83 PR Interval:    QRS Duration: 80 QT Interval:  381 QTC Calculation: 448 R Axis:   33 Text Interpretation:  Sinus rhythm Nonspecific T abnormalities, lateral leads Baseline wander in lead(s) V2 Artifact When compared with ECG of 11/17/2015 QT has shortened Confirmed by [facility]  MD, Nunzio Cory [phone]) on 06/01/2016 4:30:19 PM       Radiology   Procedures Procedures (including critical care time)  Medications Ordered in ED Medications  aspirin chewable tablet 324 mg (not administered)  nitroGLYCERIN (NITROSTAT) SL tablet 0.4 mg (not administered)     Initial Impression / Assessment and Plan / ED Course  I have reviewed the triage vital signs and the nursing notes.  Pertinent labs & imaging results that were available during my care of the patient were reviewed by me and considered in my medical decision making (see chart for details).  MDM Reviewed: previous chart, nursing note and vitals Reviewed previous: labs and ECG Interpretation: labs, ECG and x-ray   Results for orders placed or performed during the hospital encounter of 53/74/82  Basic metabolic panel  Result Value Ref Range   Sodium 140 135 - 145 mmol/L   Potassium 4.5 3.5 - 5.1 mmol/L   Chloride 107 101 - 111 mmol/L   CO2 28 22 - 32 mmol/L   Glucose, Bld 181 (H)  65 - 99 mg/dL   BUN 27 (H) 6 - 20 mg/dL   Creatinine, Ser 1.57 (H) 0.61 - 1.24 mg/dL   Calcium 8.3 (L) 8.9 - 10.3 mg/dL   GFR calc non Af Amer 46 (L) >60 mL/min   GFR calc Af Amer 54 (L) >60 mL/min  Anion gap 5 5 - 15  CBC  Result Value Ref Range   WBC 5.8 4.0 - 10.5 K/uL   RBC 4.06 (L) 4.22 - 5.81 MIL/uL   Hemoglobin 11.0 (L) 13.0 - 17.0 g/dL   HCT 33.3 (L) 39.0 - 52.0 %   MCV 82.0 78.0 - 100.0 fL   MCH 27.1 26.0 - 34.0 pg   MCHC 33.0 30.0 - 36.0 g/dL   RDW 13.9 11.5 - 15.5 %   Platelets 236 150 - 400 K/uL  Troponin I  Result Value Ref Range   Troponin I <0.03 <0.03 ng/mL  Brain natriuretic peptide  Result Value Ref Range   B Natriuretic Peptide 240.0 (H) 0.0 - 100.0 pg/mL   Dg Chest 2 View Result Date: 06/01/2016 CLINICAL DATA:  Exertional shortness of breath, left-sided chest discomfort, nonproductive cough, pain between the shoulder blades for several days. History of CHF, coronary artery disease, and diabetes. EXAM: CHEST  2 VIEW COMPARISON:  PA and lateral chest x-ray of June 01, 2016 at 2:17 p.m. Also PA and lateral chest x-ray of April 17, 2015. FINDINGS: The lungs are adequately inflated. Chronically increased lung markings at the lung bases are noted, but these are more conspicuous than on the study of approximately 2 hours ago. There remains a small right pleural effusion. Bibasilar interstitial densities persist and are slightly more conspicuous. The cardiac silhouette is enlarged. The central pulmonary vascularity is more engorged. There is fluid in the minor fissure. The trachea is midline. IMPRESSION: Chronic interstitial lung disease. Interval worsening in the appearance of the lung bases consistent with interstitial edema secondary to CHF. Worsening of atelectasis or pneumonia is felt less likely. Persistent pleural fluid and pleural thickening at the right lung base. Electronically Signed   By: David  Martinique M.D.   On: 06/01/2016 16:34    Results for FRANCE, NOYCE (MRN [id number]) as of 06/01/2016 17:26  Ref. Range 11/18/2015 05:20 11/19/2015 09:29 11/20/2015 05:54 05/30/2016 10:59 06/01/2016 16:28  BUN Latest Ref Range: 6 - 20 mg/dL 30 (H) 27 (H) 27 (H) 26 27 (H)  Creatinine Latest Ref Range: 0.61 - 1.24 mg/dL 1.15 1.13 1.04 1.19 1.57 (H)    Results for JAHKARI, MACLIN (MRN [id number]) as of 06/01/2016 17:26  Ref. Range 04/17/2015 12:51 10/17/2015 10:14 11/04/2015 11:44 11/16/2015 11:50 06/01/2016 16:28  B Natriuretic Peptide Latest Ref Range: 0.0 - 100.0 pg/mL 130.0 (H) 581.0 (H) 307.0 (H) 229.0 (H) 240.0 (H)    1740:  ASA and SL ntg given with improvement of CP. IV lasix given for CHF. Dx and testing d/w pt.  Questions answered.  Verb understanding, agreeable to admit.  T/C to [facility] Lorin Mercy, case discussed, including:  HPI, pertinent PM/SHx, VS/PE, dx testing, ED course and treatment:  Agreeable to come to ED for evaluation for admission.      Final Clinical Impressions(s) / ED Diagnoses   Final diagnoses:  None    New Prescriptions New Prescriptions   No medications on file     Francine Graven, DO 06/08/16 1012

## 2016-06-01 NOTE — Telephone Encounter (Signed)
Patient called stating that he continues to have shortness of breath - no chest pains - states he has a funny feeling around the chest area for the past 3 days.   Please  Call [phone].

## 2016-06-01 NOTE — H&P (Signed)
History and Physical    Brad Mendoza:[id number] DOB: Dec 11, 1955 DOA: 06/01/2016  PCP: [facility] Consultants:  Bronson Ing - cardiology Patient coming from: home - lives alone; NOK: daughter, Mateo Flow, [phone]  Chief Complaint: chest pain  HPI: Brad Mendoza is a 61 y.o. male with medical history significant of nonischemic cardiomyopathy with CHF, EF 35-40% in 12/17; HLD; HTN; DM; and CAD (LAD with 50% stenosis on remote cath) presenting with "My fluid been on my chest.  Like it was last time."  Called the doctor and went in for an appointment and they sent him here.  Has been having problems since eating ham last Sunday, 2-3 days ago.  SOB last night, unable to rest "'cause that rattling was in my chest, wheezing, I just sat up all night."  Substernal burning with a sensation into the left chest.  +SOB.  Slight cough, nonproductive.  SOB with moving around.  Substernal pressure, stinging pain, seems to be more sporadic - but NTG does help it more.  Unable to do usual activities because 'I'm been up to par."  +LE edema.  Last stress test - "It's been a while."  Remote h/o last cath.   ED Course: ASA and NTG given with improvement in chest pain.  IV Lasix given for CHF.  Review of Systems: As per HPI; otherwise review of systems reviewed and negative.   Ambulatory Status:  ambulates without assistance  Past Medical History:  Diagnosis Date  . Acid reflux   . Back pain    f4 and f5  . CAD (coronary artery disease)   . CHF (congestive heart failure) ([place])    last echo was in March 2016  . Diabetes mellitus 2005  . Diverticulosis   . H/O chest pain 2005   ECHO  . HTN (hypertension)   . Hyperlipidemia   . Nonischemic cardiomyopathy ([place])   . Onychomycosis   . Polysubstance abuse     Past Surgical History:  Procedure Laterality Date  . APPENDECTOMY    . CARDIAC CATHETERIZATION  2005   4 frech cath  . COLONOSCOPY N/A 08/01/2013   Procedure: COLONOSCOPY;  Surgeon: Rogene Houston, MD;  [facility];  Service: Endoscopy;  Laterality: N/A;  rescheduled to [place] notified pt  . ESOPHAGOGASTRODUODENOSCOPY N/A 04/23/2015   Procedure: ESOPHAGOGASTRODUODENOSCOPY (EGD);  Surgeon: Rogene Houston, MD;  Location: [facility];  Service: Endoscopy;  Laterality: N/A;  1:25    Social History   Social History  . Marital status: Married    Spouse name: N/A  . Number of children: N/A  . Years of education: N/A   Occupational History  . disabled    Social History Main Topics  . Smoking status: Never Smoker  . Smokeless tobacco: Current User    Types: Snuff     Comment: dips snuff about 6 cans/week for 3 years  . Alcohol use 0.6 oz/week    1 Cans of beer per week     Comment: occ - has cut down, now once every 2-3 months  . Drug use: Yes    Types: Marijuana     Comment: occasional use - smoking less  . Sexual activity: Yes   Other Topics Concern  . Not on file   Social History Narrative  . No narrative on file    Allergies  Allergen Reactions  . Sulfa Antibiotics Shortness Of Breath  . Penicillins Rash    Has patient had a PCN reaction causing  immediate rash, facial/tongue/throat swelling, SOB or lightheadedness with hypotension: No Has patient had a PCN reaction causing severe rash involving mucus membranes or skin necrosis: No Has patient had a PCN reaction that required hospitalization No Has patient had a PCN reaction occurring within the last 10 years: No If all of the above answers are "NO", then may proceed with Cephalosporin use.     Family History  Problem Relation Age of Onset  . Hypertension Father   . Diabetes Father   . Cancer Mother   . Alcohol abuse Brother   . Colon cancer Neg Hx     Prior to Admission medications   Medication Sig Start Date End Date Taking? Authorizing Provider  albuterol (PROVENTIL HFA;VENTOLIN HFA) 108 (90 Base) MCG/ACT inhaler Inhale 2 puffs into the lungs every 6 (six) hours as needed for  wheezing or shortness of breath. 05/21/15   [name], MD  amLODipine (NORVASC) 5 MG tablet Take 1 tablet (5 mg total) by mouth daily. Patient taking differently: Take 2.5 mg by mouth daily. 1/2 tablet daily 02/04/16 06/01/16  Herminio Commons, MD  aspirin EC 81 MG tablet Take 81 mg by mouth daily.    Historical Provider, MD  Capsaicin (ICY HOT ARTHRITIS THERAPY EX) Apply 1 application topically 2 (two) times daily as needed (back pain).     Historical Provider, MD  carvedilol (COREG) 12.5 MG tablet TAKE ONE TABLET TWICE A DAY WITH FOOD 05/24/16   Chipper Herb, MD  fluticasone [facility]) 50 MCG/ACT nasal spray USE 2 SPRAYS IN [facility] NOSTRIL DAILY 05/24/16   Chipper Herb, MD  furosemide (LASIX) 40 MG tablet Take 1.5 tablets (60 mg total) by mouth daily. 10/30/15 06/01/16  Lendon Colonel, NP  gabapentin (NEURONTIN) 300 MG capsule Take 1 capsule (300 mg total) by mouth at bedtime. 03/28/16   [name], MD  HUMALOG KWIKPEN 100 UNIT/ML KiwkPen INJECT UP TO 15 UNITS PRIOR TO EACH MEALAS NEEDED FOR ELAVATED BG Patient taking differently: patient using a sliding scale 03/30/16   [name], MD  Insulin Glargine (TOUJEO SOLOSTAR) 300 UNIT/ML SOPN Inject 70-90 Units into the skin daily. 05/24/16   Fransisca Kaufmann Dettinger, MD  isosorbide mononitrate (IMDUR) 60 MG 24 hr tablet TAKE 1 AND 1/2 TABLETS DAILY 05/24/16   Chipper Herb, MD  lisinopril (PRINIVIL,ZESTRIL) 40 MG tablet TAKE ONE (1) TABLET EACH DAY 05/24/16   Chipper Herb, MD  LORazepam (ATIVAN) 0.5 MG tablet Take 1 tablet (0.5 mg total) by mouth at bedtime. 03/15/16   [name], MD  metFORMIN (GLUCOPHAGE) 500 MG tablet Take 1 tablet (500 mg total) by mouth 2 (two) times daily with a meal. 05/30/16   Fransisca Kaufmann Dettinger, MD  Multiple Vitamins-Minerals (CENTRUM SILVER ADULT 50+ PO) Take 1 tablet by mouth daily.    Historical Provider, MD  nitroGLYCERIN (NITROSTAT) 0.4 MG SL tablet DISSOLVE 1 TAB UNDER TOUNGE FOR CHEST PAIN. MAY REPEAT EVERY 5  MINUTES FOR 3 DOSES. IF NO RELIEF CALL 911 OR GO TO ER 12/10/15   [name], MD  Omega-3 Fatty Acids (FISH OIL) 1000 MG CPDR Take 1 capsule by mouth daily.    Historical Provider, MD  pantoprazole (PROTONIX) 40 MG tablet TAKE ONE (1) TABLET EACH DAY 05/24/16   Chipper Herb, MD  rosuvastatin (CRESTOR) 10 MG tablet TAKE ONE (1) TABLET EACH DAY 05/24/16   Chipper Herb, MD  spironolactone (ALDACTONE) 25 MG tablet TAKE 1/2 TABLET DAILY 05/24/16  Chipper Herb, MD  terbinafine (LAMISIL) 1 % cream Apply 1 application topically 2 (two) times daily. Apply to both feet and between toes 03/28/16   Cherre Robins, PharmD  ULTICARE MINI PEN NEEDLES 31G X 6 MM MISC 4 TIMES DAILY 05/24/16   Chipper Herb, MD    Physical Exam: Vitals:   06/01/16 1900 06/01/16 1930 06/01/16 2000 06/01/16 2112  BP: (!) 167/93 (!) 155/94 127/70   Pulse: 87 82 85   Resp: (!) 27 15 19    Temp:      TempSrc:      SpO2: 96% 96% 94%   Weight:    99.4 kg (219 lb 3.2 oz)  Height:         General:  Appears calm and comfortable and is NAD Eyes:  PERRL, EOMI, normal lids, iris ENT:  grossly normal hearing, lips & tongue, mmm Neck:  no LAD, masses or thyromegaly Cardiovascular:  RRR, no m/r/g. 2+ pitting LE edema.  Respiratory:  CTA bilaterally, no w/r/r. Normal respiratory effort. Abdomen:  soft, ntnd, NABS Skin:  no rash or induration seen on limited exam Musculoskeletal:  grossly normal tone BUE/BLE, good ROM, no bony abnormality Psychiatric:  grossly normal mood and affect, speech fluent and appropriate, AOx3 Neurologic:  CN 2-12 grossly intact, moves all extremities in coordinated fashion, sensation intact  Labs on Admission: I have personally reviewed following labs and imaging studies  CBC:  Recent Labs Lab 05/30/16 1059 06/01/16 1628  WBC 7.3 5.8  NEUTROABS 4.3  --   HGB  --  11.0*  HCT 36.9* 33.3*  MCV 82 82.0  PLT 287 631   Basic Metabolic Panel:  Recent Labs Lab 05/30/16 1059 06/01/16 1628  NA  142 140  K 4.2 4.5  CL 103 107  CO2 23 28  GLUCOSE 47* 181*  BUN 26 27*  CREATININE 1.19 1.57*  CALCIUM 8.3* 8.3*   GFR: Estimated Creatinine Clearance: 58.2 mL/min (A) (by C-G formula based on SCr of 1.57 mg/dL (H)). Liver Function Tests:  Recent Labs Lab 05/30/16 1059  AST 31  ALT 29  ALKPHOS 86  BILITOT 0.2  PROT 5.1*  ALBUMIN 2.9*   No results for input(s): LIPASE, AMYLASE in the last 168 hours. No results for input(s): AMMONIA in the last 168 hours. Coagulation Profile: No results for input(s): INR, PROTIME in the last 168 hours. Cardiac Enzymes:  Recent Labs Lab 06/01/16 1628  TROPONINI <0.03   BNP (last 3 results) No results for input(s): PROBNP in the last 8760 hours. HbA1C: No results for input(s): HGBA1C in the last 72 hours. CBG: No results for input(s): GLUCAP in the last 168 hours. Lipid Profile:  Recent Labs  05/30/16 1059  CHOL 215*  HDL 66  LDLCALC 133*  TRIG 82  CHOLHDL 3.3   Thyroid Function Tests:  Recent Labs  05/30/16 1059  TSH 1.790   Anemia Panel: No results for input(s): VITAMINB12, FOLATE, FERRITIN, TIBC, IRON, RETICCTPCT in the last 72 hours. Urine analysis:    Component Value Date/Time   COLORURINE YELLOW 11/19/2015 1620   APPEARANCEUR CLEAR 11/19/2015 1620   LABSPEC >1.030 (H) 11/19/2015 1620   PHURINE 5.5 11/19/2015 1620   GLUCOSEU 250 (A) 11/19/2015 1620   HGBUR TRACE (A) 11/19/2015 1620   BILIRUBINUR NEGATIVE 11/19/2015 1620   BILIRUBINUR neg 11/07/2014 1154   KETONESUR NEGATIVE 11/19/2015 1620   PROTEINUR 100 (A) 11/19/2015 1620   UROBILINOGEN negative 11/07/2014 1154   UROBILINOGEN 0.2 05/07/2013 0851  NITRITE NEGATIVE 11/19/2015 1620   LEUKOCYTESUR NEGATIVE 11/19/2015 1620    Creatinine Clearance: Estimated Creatinine Clearance: 58.2 mL/min (A) (by C-G formula based on SCr of 1.57 mg/dL (H)).  Sepsis Labs: @LABRCNTIP (procalcitonin:4,lacticidven:4) )No results found for this or any previous visit  (from the past 240 hour(s)).   Radiological Exams on Admission: Dg Chest 2 View  Result Date: 06/01/2016 CLINICAL DATA:  Exertional shortness of breath, left-sided chest discomfort, nonproductive cough, pain between the shoulder blades for several days. History of CHF, coronary artery disease, and diabetes. EXAM: CHEST  2 VIEW COMPARISON:  PA and lateral chest x-ray of June 01, 2016 at 2:17 p.m. Also PA and lateral chest x-ray of April 17, 2015. FINDINGS: The lungs are adequately inflated. Chronically increased lung markings at the lung bases are noted, but these are more conspicuous than on the study of approximately 2 hours ago. There remains a small right pleural effusion. Bibasilar interstitial densities persist and are slightly more conspicuous. The cardiac silhouette is enlarged. The central pulmonary vascularity is more engorged. There is fluid in the minor fissure. The trachea is midline. IMPRESSION: Chronic interstitial lung disease. Interval worsening in the appearance of the lung bases consistent with interstitial edema secondary to CHF. Worsening of atelectasis or pneumonia is felt less likely. Persistent pleural fluid and pleural thickening at the right lung base. Electronically Signed   By: David  Martinique M.D.   On: 06/01/2016 16:34   Dg Chest 2 View  Result Date: 06/01/2016 CLINICAL DATA:  Shortness of breath, history of hypertension, CHF, diabetes, coronary artery disease EXAM: CHEST  2 VIEW COMPARISON:  PA and lateral chest x-ray of November 16, 2015 FINDINGS: The lungs are adequately inflated. There are bilateral pleural effusions greatest on the right. There is fluid within the fissures. The interstitial markings of both lungs are coarse but have improved since the previous study. The cardiac silhouette remains enlarged. The pulmonary vascularity is not clearly engorged. The bony thorax exhibits no acute abnormality. IMPRESSION: Chronic bronchitic changes with superimposed low-grade  CHF. There are small bilateral pleural effusions greatest on the right. There is no alveolar pneumonia. Thoracic aortic atherosclerosis. Electronically Signed   By: David  Martinique M.D.   On: 06/01/2016 15:30    EKG: Independently reviewed.  NSR with rate 83; nonspecific ST changes with no evidence of acute ischemia  Assessment/Plan Principal Problem:   Unstable angina (HCC) Active Problems:   Hyperlipemia   Tobacco dependence   Essential hypertension   Nonischemic cardiomyopathy (HCC)   Type 2 diabetes mellitus with complications (HCC)   CHF exacerbation (HCC)   Unstable angina/CHF exacerbation -Patient with substernal chest pressure that has been worsening over the last few days and is improved with NTG.  ?exertional. -2-3/3 typical symptoms suggestive of atypical vs. cardiac chest pain.  -CXR unremarkable.   -Initial cardiac troponin negative.  -EKG not indicative of acute ischemia.   -GRACE score is 150; which predicts an in-hospital death rate of 3.9%.  -Will plan to admit on telemetry to rule out ACS. -Will transfer to [facility] at this time after discussion of both options - staying at [facility] and transferring to [facility].  -cycle troponin q6h x 3 and repeat EKG in AM -Continue ASA 81 mg  Daily -Continue Imdur -morphine given -Risk factor stratification with HgbA1c and FLP; will also check TSH and UDS -Cardiology consultation in AM - NPO for possible stress test vs. Catheterization; he reportedly had 50% LAD obstruction on remote past cath and so it may be  reasonable to proceed straight to cath.   -Will plan to start Heparin drip if enzymes are positive and/or chest pain recurs -Echo in 12/17 showed EF 35-40% with grade 2 diastolic dysfunction; will repeat Echo looking for wall motion abnormality.  He may qualify for an AICD soon. -There does also appear to be a component of mild CHF exacerbation, although there is concern for also ACS.   -CXR shows apparent mild pulmonary edema -Elevated  BNP but stable -Initial EKG suggested possible new-onset afib, but RRR while I was in room and appeared to be in NSR; repeating EKG now -Continue ACE (although slightly increased creatinine so may need to hold if worse tomorrow) and BB -Hold CCB and consider cessation -Was given Lasix 40 mg x 1 in ER and will continue with 60 mg IV BID; hold home Aldactone for now -Continue [place] O2 prn for now -Creatinine 1.57, prior 1.19 on 4/9; suspect that this is due to decreased renal perfusion in the setting of CHF exacerbation; if worsening despite diuresis, stop ACE and consider further evaluation -Repeat EKG in AM  HTN -Takes Norvasc, Coreg, Lisinopril at home -Suggest consideration of stopping Norvasc - while CCB does provide good BP control for African-Americans, he would much more likely benefit from Bidil  HLD -Continue Crestor -Lipids were checked 05/30/16 (TC 215, HDL 66, LDL 133, TG 82) so will not repeat at this time -It does not appear that Crestor was adjusted after this last lipid panel, but his LDL goal is <70 so will increase to 20 mg  DM -Glucose 181 -Long-acting insulin (Toujeo) has been at 70 units but he has been having episodic hypoglycemia; after discussion with [facility] pharmacist, will decrease dose to 30 units and cover with SSI -Last A1c was 13.1  Tobacco dependence -Encourage cessation.  This was discussed with the patient and should be reviewed on an ongoing basis.   -Patch ordered.  DVT prophylaxis:  Lovenox  Code Status: Full - confirmed with patient Family Communication: None present Disposition Plan:  Home once clinically improved Consults called: Cardiology (via Inbox message to [employer]); he will likely need PT evaluation and possible placement Admission status: Admit - It is my clinical opinion that admission to INPATIENT is reasonable and necessary because this patient will require at least 2 midnights in the hospital to treat this condition based on the medical  complexity of the problems presented.  Given the aforementioned information, the predictability of an adverse outcome is felt to be significant.    Karmen Bongo MD [facility]  If 7PM-7AM, please contact night-coverage [email or web address] Password [email or web address]  06/01/2016, 9:20 PM

## 2016-06-01 NOTE — ED Triage Notes (Signed)
Pt brought in by [facility] with c/o SOB and left sided chest achiness x several days. SOB worse with exertion. Pt went to see PCP and pt was told to come to ED. Pt has pitting edema to BLE. Pt has hx of CHF. EKG NSR for EMS.

## 2016-06-02 DIAGNOSIS — I200 Unstable angina: Secondary | ICD-10-CM

## 2016-06-02 LAB — TROPONIN I: Troponin I: <0.03 ng/mL (ref <0.03)

## 2016-06-02 LAB — BASIC METABOLIC PANEL
ANION GAP: 6 (ref 5–15)
BUN: 28 mg/dL — ABNORMAL HIGH (ref 6–20)
CHLORIDE: 108 mmol/L (ref 101–111)
CO2: 25 mmol/L (ref 22–32)
CREATININE: 1.56 mg/dL — AB (ref 0.61–1.24)
Calcium: 8 mg/dL — ABNORMAL LOW (ref 8.9–10.3)
GFR calc non Af Amer: 47 mL/min — ABNORMAL LOW (ref >60)
GFR, EST AFRICAN AMERICAN: 54 mL/min — AB (ref >60)
Glucose, Bld: 128 mg/dL — ABNORMAL HIGH (ref 65–99)
Potassium: 4 mmol/L (ref 3.5–5.1)
SODIUM: 139 mmol/L (ref 135–145)

## 2016-06-02 LAB — GLUCOSE, CAPILLARY
GLUCOSE-CAPILLARY: 78 mg/dL (ref 65–99)
GLUCOSE-CAPILLARY: 136 mg/dL — AB (ref 65–99)
GLUCOSE-CAPILLARY: 170 mg/dL — AB (ref 65–99)
Glucose-Capillary: 217 mg/dL — ABNORMAL HIGH (ref 65–99)

## 2016-06-02 LAB — HEMOGLOBIN A1C
Hgb A1c MFr Bld: 10.1 % — ABNORMAL HIGH (ref 4.8–5.6)
MEAN PLASMA GLUCOSE: 243 mg/dL

## 2016-06-02 LAB — HIV ANTIBODY (ROUTINE TESTING W REFLEX): HIV Screen 4th Generation wRfx: NONREACTIVE

## 2016-06-02 MED ORDER — SODIUM CHLORIDE 0.9 % IV SOLN
INTRAVENOUS | Status: DC
  Administered 2016-06-03: 07:00:00 via INTRAVENOUS

## 2016-06-02 MED ORDER — SODIUM CHLORIDE 0.9% FLUSH
3.0000 mL | Freq: Two times a day (BID) | INTRAVENOUS | Status: DC
  Administered 2016-06-03: 3 mL via INTRAVENOUS

## 2016-06-02 MED ORDER — SODIUM CHLORIDE 0.9% FLUSH: 3.0000 mL | INTRAVENOUS | Status: DC | PRN

## 2016-06-02 MED ORDER — SODIUM CHLORIDE 0.9 % IV SOLN: 250.0000 mL | INTRAVENOUS | Status: DC | PRN

## 2016-06-02 MED ORDER — ASPIRIN 81 MG PO CHEW
81.0000 mg | CHEWABLE_TABLET | ORAL | Status: AC
  Administered 2016-06-03: 81 mg via ORAL
  Filled 2016-06-02: qty 1

## 2016-06-02 NOTE — Progress Notes (Signed)
PT Cancellation Note  Patient Details Name: HAJI DELAINE MRN: [id number] DOB: 02-06-1956   Cancelled Treatment:    Reason Eval/Treat Not Completed: PT screened, no needs identified, will sign off (Patient reports no deficits and is Independent. )  Patient reports "It would be a waste of your and my time."  Educated about medical compliance with treatment and to notify physician if status changes.  Judith Blonder, DPT   Zenia Resides, Takari Duncombe L 06/02/2016, 9:36 AM

## 2016-06-02 NOTE — Progress Notes (Signed)
PROGRESS NOTE    Brad Mendoza  ZGY:[id number] DOB: [birth year] DOA: 06/01/2016 PCP: [name], MD    Brief Narrative:  Brad Mendoza is a 61 y.o. male with medical history significant of nonischemic cardiomyopathy with CHF, EF 35-40% in 12/17; HLD; HTN; DM; and CAD (LAD with 50% stenosis on remote cath) presenting with "My fluid been on my chest.  Like it was last time."  Called the doctor and went in for an appointment and they sent him here.   Assessment & Plan:   Principal Problem:   Unstable angina [facility]) - Cardiology consulted. Further plans per them. Patient to undergo further workup  Active Problems:   Hyperlipemia - Continue Lovaza and Crestor    Tobacco dependence - Continue nicotine patch recommend cessation on discharge    Essential hypertension - lisinopril, imdur, carvedilol    Type 2 diabetes mellitus with complications (HCC) - Continue Lantus - Carb modified diet    CHF exacerbation ([place]) - Cardiology assisting - diuresis per cards  DVT prophylaxis:  Lovenox Code Status: Full Family Communication: None at bedside Disposition Plan: Pending final recommendations from cardiologist   Consultants:   Cardiology   Procedures: none   Antimicrobials: none   Subjective: Pt has no new complaints  Objective: Vitals:   06/01/16 2000 06/01/16 2112 06/01/16 2123 06/02/16 0334  BP: 127/70  139/85 134/80  Pulse: 85  83 76  Resp: 19  (!) 23 20  Temp:   98.6 F (37 C) 98.4 F (36.9 C)  TempSrc:   Oral   SpO2: 94%   99%  Weight:  99.4 kg (219 lb 3.2 oz)  99.5 kg (219 lb 4.8 oz)  Height:        Intake/Output Summary (Last 24 hours) at 06/02/16 1443 Last data filed at 06/02/16 1114  Gross per 24 hour  Intake              840 ml  Output             2350 ml  Net            -1510 ml   Filed Weights   06/01/16 1607 06/01/16 2112 06/02/16 0334  Weight: 104.3 kg (230 lb) 99.4 kg (219 lb 3.2 oz) 99.5 kg (219 lb 4.8 oz)     Examination:  General exam: Appears calm and comfortable, in nad. Respiratory system: Clear to auscultation. Respiratory effort normal. Cardiovascular system: S1 & S2 heard, RRR. No JVD, murmurs, rubs,. Gastrointestinal system: Abdomen is nondistended, soft and nontender. No organomegaly or masses felt. Normal bowel sounds heard. Central nervous system: Alert and oriented. No focal neurological deficits. Extremities: Symmetric 5 x 5 power. Skin: No rashes, lesions or ulcers, on limited exam. Psychiatry: Judgement and insight appear normal.   Data Reviewed: I have personally reviewed following labs and imaging studies  CBC:  Recent Labs Lab 05/30/16 1059 06/01/16 1628 06/01/16 2123  WBC 7.3 5.8 6.4  NEUTROABS 4.3  --  3.8  HGB  --  11.0* 10.5*  HCT 36.9* 33.3* 32.9*  MCV 82 82.0 81.2  PLT 287 236 591   Basic Metabolic Panel:  Recent Labs Lab 05/30/16 1059 06/01/16 1628 06/02/16 0321  NA 142 140 139  K 4.2 4.5 4.0  CL 103 107 108  CO2 23 28 25   GLUCOSE 47* 181* 128*  BUN 26 27* 28*  CREATININE 1.19 1.57* 1.56*  CALCIUM 8.3* 8.3* 8.0*   GFR: Estimated Creatinine Clearance: 58.5 mL/min (A) (  by C-G formula based on SCr of 1.56 mg/dL (H)). Liver Function Tests:  Recent Labs Lab 05/30/16 1059  AST 31  ALT 29  ALKPHOS 86  BILITOT 0.2  PROT 5.1*  ALBUMIN 2.9*   No results for input(s): LIPASE, AMYLASE in the last 168 hours. No results for input(s): AMMONIA in the last 168 hours. Coagulation Profile: No results for input(s): INR, PROTIME in the last 168 hours. Cardiac Enzymes:  Recent Labs Lab 06/01/16 1628 06/01/16 2123 06/02/16 0321 06/02/16 1123  TROPONINI <0.03 <0.03 <0.03 <0.03   BNP (last 3 results) No results for input(s): PROBNP in the last 8760 hours. HbA1C: No results for input(s): HGBA1C in the last 72 hours. CBG:  Recent Labs Lab 06/01/16 2119 06/02/16 0732 06/02/16 1123  GLUCAP 226* 78 136*   Lipid Profile: No results for  input(s): CHOL, HDL, LDLCALC, TRIG, CHOLHDL, LDLDIRECT in the last 72 hours. Thyroid Function Tests:  Recent Labs  06/01/16 2123  TSH 3.282   Anemia Panel: No results for input(s): VITAMINB12, FOLATE, FERRITIN, TIBC, IRON, RETICCTPCT in the last 72 hours. Sepsis Labs: No results for input(s): PROCALCITON, LATICACIDVEN in the last 168 hours.  No results found for this or any previous visit (from the past 240 hour(s)).   Radiology Studies: Dg Chest 2 View  Result Date: 06/01/2016 CLINICAL DATA:  Exertional shortness of breath, left-sided chest discomfort, nonproductive cough, pain between the shoulder blades for several days. History of CHF, coronary artery disease, and diabetes. EXAM: CHEST  2 VIEW COMPARISON:  PA and lateral chest x-ray of June 01, 2016 at 2:17 p.m. Also PA and lateral chest x-ray of April 17, 2015. FINDINGS: The lungs are adequately inflated. Chronically increased lung markings at the lung bases are noted, but these are more conspicuous than on the study of approximately 2 hours ago. There remains a small right pleural effusion. Bibasilar interstitial densities persist and are slightly more conspicuous. The cardiac silhouette is enlarged. The central pulmonary vascularity is more engorged. There is fluid in the minor fissure. The trachea is midline. IMPRESSION: Chronic interstitial lung disease. Interval worsening in the appearance of the lung bases consistent with interstitial edema secondary to CHF. Worsening of atelectasis or pneumonia is felt less likely. Persistent pleural fluid and pleural thickening at the right lung base. Electronically Signed   By: David  Martinique M.D.   On: 06/01/2016 16:34   Dg Chest 2 View  Result Date: 06/01/2016 CLINICAL DATA:  Shortness of breath, history of hypertension, CHF, diabetes, coronary artery disease EXAM: CHEST  2 VIEW COMPARISON:  PA and lateral chest x-ray of November 16, 2015 FINDINGS: The lungs are adequately inflated. There  are bilateral pleural effusions greatest on the right. There is fluid within the fissures. The interstitial markings of both lungs are coarse but have improved since the previous study. The cardiac silhouette remains enlarged. The pulmonary vascularity is not clearly engorged. The bony thorax exhibits no acute abnormality. IMPRESSION: Chronic bronchitic changes with superimposed low-grade CHF. There are small bilateral pleural effusions greatest on the right. There is no alveolar pneumonia. Thoracic aortic atherosclerosis. Electronically Signed   By: David  Martinique M.D.   On: 06/01/2016 15:30    Scheduled Meds: . aspirin EC  81 mg Oral Daily  . carvedilol  12.5 mg Oral BID WC  . enoxaparin (LOVENOX) injection  40 mg Subcutaneous Q24H  . fluticasone  2 spray Each Nare Daily  . furosemide  60 mg Intravenous BID  . gabapentin  300 mg Oral  QHS  . insulin aspart  0-15 Units Subcutaneous TID WC  . insulin aspart  0-5 Units Subcutaneous QHS  . insulin glargine  40 Units Subcutaneous QHS  . isosorbide mononitrate  90 mg Oral Daily  . lisinopril  40 mg Oral Daily  . nicotine  14 mg Transdermal Daily  . omega-3 acid ethyl esters  1 g Oral Daily  . pantoprazole  40 mg Oral Daily  . rosuvastatin  20 mg Oral q1800  . sodium chloride flush  3 mL Intravenous Q12H   Continuous Infusions:   LOS: 1 day    Time spent: > 35 minutes  Velvet Bathe, MD [facility] Pager [phone]  If 7PM-7AM, please contact night-coverage [email or web address] Password [email or web address] 06/02/2016, 2:43 PM

## 2016-06-02 NOTE — Consult Note (Signed)
Cardiology Consult    Patient ID: THI SISEMORE MRN: [id number], DOB/AGE: 1956/01/14   Admit date: 06/01/2016 Date of Consult: 06/02/2016  Primary Physician: [name], MD Reason for Consult: Chest pain Primary Cardiologist: Dr. Bronson Ing Requesting Provider: Dr. Wendee Beavers  History of Present Illness    Brad Mendoza is a 61 y.o. male who is being seen today for the evaluation of chest pain at the request of Dr. Wendee Beavers. The patient has a past medical history significant for combined chronic systolic and diastolic heart failure( EF 35-40% 12/17), Non-Ischemic cardiomyopathy, issues with medication non-compliance, HLD, HTN, DM, and CAD (LAD 50% stensois by cath 2012) who presented to the [facility] ED for evaluation of chest pain.   The patient developed intermittent chest tightness with shortness of breath unrelated to activity about 3 days ago after eating high salt foods for Easter including ham and sausage.  On the night before presentation he was othopneic and wheezing and was unable to sleep. He denies prior chest pain or DOE. He has increased edema. He states that he has been taking all meds as prescribed including lasix. He dips tobacco, does not smoke.   Echocardiogram 02/03/16: moderately reduced left ventricular systolic function, LVEF 56-31%, grade 2 diastolic dysfunction with elevated filling pressures, severe left atrial dilatation, pulmonary pressures 42 mmHg.  Myocardial perfusion study in 11/2012 showed anterior and septal infarcts and EF 33%.  Cardiac cath 06/2010:  CONCLUSION:  1. Mild global hypokinesis, question secondary to hypertension.  2. Forty to fifty per narrowing in the mid left anterior descending artery     as noted in the above text.  3. No significant coronary obstruction of the circumflex or right coronary     arteries.  Past Medical History   Past Medical History:  Diagnosis Date  . Acid reflux   . Back pain    f4 and f5  . CAD (coronary  artery disease)   . CHF (congestive heart failure) ([place])    last echo was in March 2016  . Diabetes mellitus 2005  . Diverticulosis   . H/O chest pain 2005   ECHO  . HTN (hypertension)   . Hyperlipidemia   . Nonischemic cardiomyopathy ([place])   . Onychomycosis   . Polysubstance abuse     Past Surgical History:  Procedure Laterality Date  . APPENDECTOMY    . CARDIAC CATHETERIZATION  2005   4 frech cath  . COLONOSCOPY N/A 08/01/2013   Procedure: COLONOSCOPY;  Surgeon: Rogene Houston, MD;  Location: [facility] ENDO [facility];  Service: Endoscopy;  Laterality: N/A;  rescheduled to [place] notified pt  . ESOPHAGOGASTRODUODENOSCOPY N/A 04/23/2015   Procedure: ESOPHAGOGASTRODUODENOSCOPY (EGD);  Surgeon: Rogene Houston, MD;  Location: [facility] ENDO SUITE;  Service: Endoscopy;  Laterality: N/A;  1:25     Allergies  Allergies  Allergen Reactions  . Sulfa Antibiotics Shortness Of Breath  . Penicillins Rash    Has patient had a PCN reaction causing immediate rash, facial/tongue/throat swelling, SOB or lightheadedness with hypotension: No Has patient had a PCN reaction causing severe rash involving mucus membranes or skin necrosis: No Has patient had a PCN reaction that required hospitalization No Has patient had a PCN reaction occurring within the last 10 years: No If all of the above answers are "NO", then may proceed with Cephalosporin use.     Inpatient Medications    . aspirin EC  81 mg Oral Daily  . carvedilol  12.5 mg Oral BID  WC  . enoxaparin (LOVENOX) injection  40 mg Subcutaneous Q24H  . fluticasone  2 spray Each Nare Daily  . furosemide  60 mg Intravenous BID  . gabapentin  300 mg Oral QHS  . insulin aspart  0-15 Units Subcutaneous TID WC  . insulin aspart  0-5 Units Subcutaneous QHS  . insulin glargine  40 Units Subcutaneous QHS  . isosorbide mononitrate  90 mg Oral Daily  . lisinopril  40 mg Oral Daily  . nicotine  14 mg Transdermal Daily  . omega-3 acid ethyl esters  1 g Oral Daily    . pantoprazole  40 mg Oral Daily  . rosuvastatin  20 mg Oral q1800  . sodium chloride flush  3 mL Intravenous Q12H  . spironolactone  12.5 mg Oral Daily    Family History    Family History  Problem Relation Age of Onset  . Hypertension Father   . Diabetes Father   . Cancer Mother   . Alcohol abuse Brother   . Colon cancer Neg Hx      Social History    Social History   Social History  . Marital status: Married    Spouse name: N/A  . Number of children: N/A  . Years of education: N/A   Occupational History  . disabled    Social History Main Topics  . Smoking status: Never Smoker  . Smokeless tobacco: Current User    Types: Snuff     Comment: dips snuff about 6 cans/week for 3 years  . Alcohol use 0.6 oz/week    1 Cans of beer per week     Comment: occ - has cut down, now once every 2-3 months  . Drug use: Yes    Types: Marijuana     Comment: occasional use - smoking less  . Sexual activity: Yes   Other Topics Concern  . Not on file   Social History Narrative  . No narrative on file     Review of Systems    General:  No chills, fever, night sweats or weight changes.  Cardiovascular:  Positive for chest pain, dyspnea on exertion, edema, orthopnea, No palpitations Dermatological: No rash, lesions/masses Respiratory: No cough, Positive for dyspnea on exertion Urologic: No hematuria, dysuria Abdominal:   No nausea, vomiting, diarrhea, bright red blood per rectum, melena, or hematemesis Neurologic:  No visual changes, wkns, changes in mental status. All other systems reviewed and are otherwise negative except as noted above.  Physical Exam   Blood pressure 134/80, pulse 76, temperature 98.4 F (36.9 C), resp. rate 20, height 5\' 9"  (1.753 m), weight 219 lb 4.8 oz (99.5 kg), SpO2 99 %.  General: Pleasant, NAD Psych: Normal affect. Neuro: Alert and oriented X 3. Moves all extremities spontaneously. HEENT: Normal  Neck: Supple without bruits, JVD  present. Lungs:  Resp regular and unlabored, CTA. Heart: RRR no s3, s4, or murmurs. Abdomen: Soft, non-tender, non-distended, BS + x 4.  Extremities: No clubbing, cyanosis.  2+ lower extremity edema.  DP/PT/Radials 2+ and equal bilaterally.  Labs    Troponin (Point of Care Test) No results for input(s): TROPIPOC in the last 72 hours.  Recent Labs  06/01/16 1628 06/01/16 2123 06/02/16 0321  TROPONINI <0.03 <0.03 <0.03   Lab Results  Component Value Date   WBC 6.4 06/01/2016   HGB 10.5 (L) 06/01/2016   HCT 32.9 (L) 06/01/2016   MCV 81.2 06/01/2016   PLT 229 06/01/2016    Recent Labs Lab 05/30/16  1059  06/02/16 0321  NA 142  < > 139  K 4.2  < > 4.0  CL 103  < > 108  CO2 23  < > 25  BUN 26  < > 28*  CREATININE 1.19  < > 1.56*  CALCIUM 8.3*  < > 8.0*  PROT 5.1*  --   --   BILITOT 0.2  --   --   ALKPHOS 86  --   --   ALT 29  --   --   AST 31  --   --   GLUCOSE 47*  < > 128*  < > = values in this interval not displayed. Lab Results  Component Value Date   CHOL 215 (H) 05/30/2016   HDL 66 05/30/2016   LDLCALC 133 (H) 05/30/2016   TRIG 82 05/30/2016   No results found for: [facility]   Radiology Studies    Dg Chest 2 View  Result Date: 06/01/2016 CLINICAL DATA:  Exertional shortness of breath, left-sided chest discomfort, nonproductive cough, pain between the shoulder blades for several days. History of CHF, coronary artery disease, and diabetes. EXAM: CHEST  2 VIEW COMPARISON:  PA and lateral chest x-ray of June 01, 2016 at 2:17 p.m. Also PA and lateral chest x-ray of April 17, 2015. FINDINGS: The lungs are adequately inflated. Chronically increased lung markings at the lung bases are noted, but these are more conspicuous than on the study of approximately 2 hours ago. There remains a small right pleural effusion. Bibasilar interstitial densities persist and are slightly more conspicuous. The cardiac silhouette is enlarged. The central pulmonary vascularity is more  engorged. There is fluid in the minor fissure. The trachea is midline. IMPRESSION: Chronic interstitial lung disease. Interval worsening in the appearance of the lung bases consistent with interstitial edema secondary to CHF. Worsening of atelectasis or pneumonia is felt less likely. Persistent pleural fluid and pleural thickening at the right lung base. Electronically Signed   By: David  Martinique M.D.   On: 06/01/2016 16:34   Dg Chest 2 View  Result Date: 06/01/2016 CLINICAL DATA:  Shortness of breath, history of hypertension, CHF, diabetes, coronary artery disease EXAM: CHEST  2 VIEW COMPARISON:  PA and lateral chest x-ray of November 16, 2015 FINDINGS: The lungs are adequately inflated. There are bilateral pleural effusions greatest on the right. There is fluid within the fissures. The interstitial markings of both lungs are coarse but have improved since the previous study. The cardiac silhouette remains enlarged. The pulmonary vascularity is not clearly engorged. The bony thorax exhibits no acute abnormality. IMPRESSION: Chronic bronchitic changes with superimposed low-grade CHF. There are small bilateral pleural effusions greatest on the right. There is no alveolar pneumonia. Thoracic aortic atherosclerosis. Electronically Signed   By: David  Martinique M.D.   On: 06/01/2016 15:30    EKG & Cardiac Imaging    EKG:  06/01/2016  NSR 83 bpm, non-specific T wave abnormalities, QTC 448 06/02/2016  NSR 76 bpm, TWI laterally, QTC 456  Echocardiogram:   Echocardiogram 02/03/16: moderately reduced left ventricular systolic function, LVEF 09-98%, grade 2 diastolic dysfunction with elevated filling pressures, severe left atrial dilatation, pulmonary pressures 42 mmHg.  Assessment & Plan     Chest pain/Unstable angina -Pt developed intermittent chest tightness with dyspnea for 3 days after eating a lot of salty foods. He also has increased weight and edema.  -Troponins negative X 3 -EKG with TWI laterally,  was previously present in 10/2015 -CAD risk factors include HLD, HTN,  DM. -Chest discomfort is in the setting of fluid overload. Advise to improve fluid status. Consider ischemic workup with stress test vs cath in light of worsening heart function.  -Plan for R&L heart cath tomorrow  Combined chronic systolic and diastolic heart failure -Last EF 35-40% with grade 2 DD 01/2016 -Treated with Lasix, Coreg, lisinopril, nitrates, and spironolactone  -Pt with increased weight (Last office wt 207, now 219), edema and JVD -BNP 240 -Has been given 40 mg IV lasix X 1 and is scheduled for lasix 60 mg IV BID -Will check echo and plan for right and left heart cath tomorrow  Essential hypertension -Home meds include amlodipine 5 mg, Coreg 12.5 mg, lasix 40 mg, Imdur, lisinopril 40 mg, spironolactone 25 mg  -Blood pressure elevated on admission, improved.  Hyperlipidemia -On crestor  Renal insufficieny  -SCr 1.56 (baseline 1.13) -Will hold spironolactone   Signed, Daune Perch, NP-C 06/02/2016, 7:37 AM Pager: [phone]  Patient examined chart reviewed. Admitted with possible angina and CHF exacerbation Cath in 2012 without hemodynamically significant disease Chronic anterolateral T wave changes Has r/o mild CHF on exam with  Basilar crackles and no murmur Trace LE edema Will get echo to see if ER moderately or severely depressed Continue diuretic follow Cr Discussed right and left cath in am with patient Risks including stroke, bleeding MI And need for immediate surgery discussed. Depending on CAD/EF may need further evaluation for AICD Will follow in [facility] with [facility] and Dr Jolyn Lent

## 2016-06-02 NOTE — Consult Note (Signed)
   [facility] CM Inpatient Consult   06/02/2016  Brad Mendoza 08/29/1955 [id number]  Patient assess for recently active but had met his goals with [facility] Management team. Patient admitted per MD notes Brad Mendoza is a 61 y.o. male who is being seen today for the evaluation of chest pain at the request of Dr. Wendee Beavers. The patient has a past medical history significant for combined chronic systolic and diastolic heart failure( EF 35-40% 12/17), Non-Ischemic cardiomyopathy, issues with medication non-compliance, HLD, HTN, DM, and CAD (LAD 50% stensois by cath 2012) who presented to the [facility] ED for evaluation of chest pain.  Patient walking with Mobility Team member.  Patient states he was doing good until recently and new things going on now.  He states he appreciated the follow up he had received from [place] and [place], [facility] team members.  He would like post hospital monitoring.  Will continue to follow for needs and progressions.  For questions, please contact:  Natividad Brood, RN BSN [facility] Liaison  [phone] business mobile phone Toll free office [phone]

## 2016-06-02 NOTE — Progress Notes (Signed)
OT Cancellation Note  Patient Details Name: Brad Mendoza MRN: [id number] DOB: [birth year]   Cancelled Treatment:    Reason Eval/Treat Not Completed: OT screened, no needs identified, will sign off. Pt up in room upon arrival; reports he is independent with mobility and has no questions or concerns regarding home or participating in ADL. Will screen and sign off at this time. Please re-consult if needs change. Thank you for this referral.  Binnie Kand M.S., OTR/L Pager: [phone]  06/02/2016, 11:11 AM

## 2016-06-03 ENCOUNTER — Encounter (HOSPITAL_COMMUNITY)
Admission: EM | Disposition: A | Discharge status: Home / Self Care | Payer: Self-pay | Source: Home / Self Care | Attending: Family Medicine

## 2016-06-03 DIAGNOSIS — I2510 Atherosclerotic heart disease of native coronary artery without angina pectoris: Secondary | ICD-10-CM

## 2016-06-03 DIAGNOSIS — F17200 Nicotine dependence, unspecified, uncomplicated: Secondary | ICD-10-CM

## 2016-06-03 DIAGNOSIS — I5022 Chronic systolic (congestive) heart failure: Secondary | ICD-10-CM

## 2016-06-03 DIAGNOSIS — I5023 Acute on chronic systolic (congestive) heart failure: Secondary | ICD-10-CM

## 2016-06-03 DIAGNOSIS — E7800 Pure hypercholesterolemia, unspecified: Secondary | ICD-10-CM

## 2016-06-03 HISTORY — PX: RIGHT/LEFT HEART CATH AND CORONARY ANGIOGRAPHY: CATH118266

## 2016-06-03 LAB — CBC
HCT: 33.4 % — ABNORMAL LOW (ref 39.0–52.0)
Hemoglobin: 10.7 g/dL — ABNORMAL LOW (ref 13.0–17.0)
MCH: 26.2 pg (ref 26.0–34.0)
MCHC: 32 g/dL (ref 30.0–36.0)
MCV: 81.9 fL (ref 78.0–100.0)
Platelets: 254 10*3/uL (ref 150–400)
RBC: 4.08 MIL/uL — ABNORMAL LOW (ref 4.22–5.81)
RDW: 13.6 % (ref 11.5–15.5)
WBC: 5 10*3/uL (ref 4.0–10.5)

## 2016-06-03 LAB — GLUCOSE, CAPILLARY
GLUCOSE-CAPILLARY: 200 mg/dL — AB (ref 65–99)
Glucose-Capillary: 64 mg/dL — ABNORMAL LOW (ref 65–99)
Glucose-Capillary: 82 mg/dL (ref 65–99)
Glucose-Capillary: 59 mg/dL — ABNORMAL LOW (ref 65–99)
Glucose-Capillary: 133 mg/dL — ABNORMAL HIGH (ref 65–99)
Glucose-Capillary: 131 mg/dL — ABNORMAL HIGH (ref 65–99)

## 2016-06-03 LAB — BASIC METABOLIC PANEL
Anion gap: 6 (ref 5–15)
BUN: 30 mg/dL — AB (ref 6–20)
CALCIUM: 8 mg/dL — AB (ref 8.9–10.3)
CO2: 27 mmol/L (ref 22–32)
CREATININE: 1.54 mg/dL — AB (ref 0.61–1.24)
Chloride: 105 mmol/L (ref 101–111)
GFR calc Af Amer: 55 mL/min — ABNORMAL LOW (ref >60)
GFR, EST NON AFRICAN AMERICAN: 47 mL/min — AB (ref >60)
GLUCOSE: 175 mg/dL — AB (ref 65–99)
Potassium: 3.9 mmol/L (ref 3.5–5.1)
Sodium: 138 mmol/L (ref 135–145)

## 2016-06-03 LAB — PROTIME-INR
INR: 0.96
PROTHROMBIN TIME: 12.8 s (ref 11.4–15.2)

## 2016-06-03 LAB — CREATININE, SERUM
CREATININE: 1.55 mg/dL — AB (ref 0.61–1.24)
GFR calc Af Amer: 54 mL/min — ABNORMAL LOW (ref >60)
GFR calc non Af Amer: 47 mL/min — ABNORMAL LOW (ref >60)

## 2016-06-03 SURGERY — RIGHT/LEFT HEART CATH AND CORONARY ANGIOGRAPHY
Anesthesia: LOCAL

## 2016-06-03 MED ORDER — SODIUM CHLORIDE 0.9% FLUSH
3.0000 mL | Freq: Two times a day (BID) | INTRAVENOUS | Status: DC
  Administered 2016-06-03 – 2016-06-07 (×7): 3 mL via INTRAVENOUS

## 2016-06-03 MED ORDER — ACETAMINOPHEN 325 MG PO TABS
650.0000 mg | ORAL_TABLET | ORAL | Status: DC | PRN
  Administered 2016-06-04: 650 mg via ORAL
  Filled 2016-06-03: qty 2

## 2016-06-03 MED ORDER — HEPARIN (PORCINE) IN NACL 2-0.9 UNIT/ML-% IJ SOLN
INTRAMUSCULAR | Status: AC
  Filled 2016-06-03: qty 1000

## 2016-06-03 MED ORDER — SODIUM CHLORIDE 0.9 % IV SOLN
INTRAVENOUS | Status: DC | PRN
  Administered 2016-06-03: 10 mL/h via INTRAVENOUS

## 2016-06-03 MED ORDER — IOPAMIDOL (ISOVUE-370) INJECTION 76%
INTRAVENOUS | Status: AC
  Filled 2016-06-03: qty 100

## 2016-06-03 MED ORDER — ONDANSETRON HCL 4 MG/2ML IJ SOLN: 4.0000 mg | Freq: Four times a day (QID) | INTRAMUSCULAR | Status: DC | PRN

## 2016-06-03 MED ORDER — MIDAZOLAM HCL 2 MG/2ML IJ SOLN
INTRAMUSCULAR | Status: DC | PRN
  Administered 2016-06-03: 1 mg via INTRAVENOUS

## 2016-06-03 MED ORDER — HEPARIN SODIUM (PORCINE) 1000 UNIT/ML IJ SOLN
INTRAMUSCULAR | Status: AC
  Filled 2016-06-03: qty 1

## 2016-06-03 MED ORDER — HEPARIN (PORCINE) IN NACL 2-0.9 UNIT/ML-% IJ SOLN
INTRAMUSCULAR | Status: DC | PRN
  Administered 2016-06-03: 10 mL via INTRA_ARTERIAL

## 2016-06-03 MED ORDER — GLUCOSE 40 % PO GEL
ORAL | Status: AC
  Administered 2016-06-03: 08:00:00
  Filled 2016-06-03: qty 1

## 2016-06-03 MED ORDER — DEXTROSE 50 % IV SOLN
INTRAVENOUS | Status: AC
  Administered 2016-06-03: 50 mL
  Filled 2016-06-03: qty 50

## 2016-06-03 MED ORDER — HEPARIN SODIUM (PORCINE) 1000 UNIT/ML IJ SOLN
INTRAMUSCULAR | Status: DC | PRN
  Administered 2016-06-03: 5000 [IU] via INTRAVENOUS

## 2016-06-03 MED ORDER — IOPAMIDOL (ISOVUE-370) INJECTION 76%
INTRAVENOUS | Status: DC | PRN
  Administered 2016-06-03: 90 mL via INTRA_ARTERIAL

## 2016-06-03 MED ORDER — FENTANYL CITRATE (PF) 100 MCG/2ML IJ SOLN
INTRAMUSCULAR | Status: AC
  Filled 2016-06-03: qty 2

## 2016-06-03 MED ORDER — HEPARIN (PORCINE) IN NACL 2-0.9 UNIT/ML-% IJ SOLN
INTRAMUSCULAR | Status: DC | PRN
  Administered 2016-06-03: 1000 mL

## 2016-06-03 MED ORDER — OXYCODONE-ACETAMINOPHEN 5-325 MG PO TABS: 1.0000 | ORAL_TABLET | ORAL | Status: DC | PRN

## 2016-06-03 MED ORDER — LIDOCAINE HCL (PF) 1 % IJ SOLN
INTRAMUSCULAR | Status: AC
  Filled 2016-06-03: qty 30

## 2016-06-03 MED ORDER — SODIUM CHLORIDE 0.9 % IV SOLN: 250.0000 mL | INTRAVENOUS | Status: DC | PRN

## 2016-06-03 MED ORDER — SODIUM CHLORIDE 0.9% FLUSH: 3.0000 mL | INTRAVENOUS | Status: DC | PRN

## 2016-06-03 MED ORDER — LIDOCAINE HCL (PF) 1 % IJ SOLN
INTRAMUSCULAR | Status: DC | PRN
  Administered 2016-06-03: 15 mL via SUBCUTANEOUS
  Administered 2016-06-03: 2 mL via SUBCUTANEOUS

## 2016-06-03 MED ORDER — ENOXAPARIN SODIUM 40 MG/0.4ML ~~LOC~~ SOLN
40.0000 mg | SUBCUTANEOUS | Status: DC
  Administered 2016-06-04 – 2016-06-08 (×5): 40 mg via SUBCUTANEOUS
  Filled 2016-06-03 (×5): qty 0.4

## 2016-06-03 MED ORDER — MIDAZOLAM HCL 2 MG/2ML IJ SOLN
INTRAMUSCULAR | Status: AC
  Filled 2016-06-03: qty 2

## 2016-06-03 MED ORDER — VERAPAMIL HCL 2.5 MG/ML IV SOLN
INTRAVENOUS | Status: AC
  Filled 2016-06-03: qty 2

## 2016-06-03 MED ORDER — FENTANYL CITRATE (PF) 100 MCG/2ML IJ SOLN
INTRAMUSCULAR | Status: DC | PRN
  Administered 2016-06-03: 50 ug via INTRAVENOUS

## 2016-06-03 SURGICAL SUPPLY — 15 items
CATH INFINITI 5 FR JL3.5 (CATHETERS) ×2 IMPLANT
CATH INFINITI JR4 5F (CATHETERS) ×2 IMPLANT
CATH SWAN GANZ 7F STRAIGHT (CATHETERS) ×2 IMPLANT
COVER PRB 48X5XTLSCP FOLD TPE (BAG) ×1 IMPLANT
COVER PROBE 5X48 (BAG) ×1
DEVICE RAD COMP TR BAND LRG (VASCULAR PRODUCTS) ×2 IMPLANT
GLIDESHEATH SLEND A-KIT 6F 22G (SHEATH) ×2 IMPLANT
GLIDESHEATH SLEND SS 6F .021 (SHEATH) ×2 IMPLANT
GUIDEWIRE INQWIRE 1.5J.035X260 (WIRE) ×1 IMPLANT
INQWIRE 1.5J .035X260CM (WIRE) ×2
KIT HEART LEFT (KITS) ×2 IMPLANT
PACK CARDIAC CATHETERIZATION (CUSTOM PROCEDURE TRAY) ×2 IMPLANT
SHEATH PINNACLE 7F 10CM (SHEATH) ×2 IMPLANT
TRANSDUCER W/STOPCOCK (MISCELLANEOUS) ×2 IMPLANT
TUBING CIL FLEX 10 FLL-RA (TUBING) ×2 IMPLANT

## 2016-06-03 NOTE — Progress Notes (Signed)
Patient in room awake and alert sitting in chair upon entering room at 2000. Patient with no c/o pain or sob. Assessment completed. Patient s/p cardiac cath; right groin and right wrist site with gauze and transparent pressure dressing; no bleeding, or hematoma noted. Patient Oriented x 3. Call bell within reach. Will continue to monitor patients status.

## 2016-06-03 NOTE — Progress Notes (Signed)
Hypoglycemic Event  CBG: 64  Treatment: Glucose gel  Symptoms: pt stated he felt "jittery"  Follow-up CBG: Time:0810 CBG Result: 82  Possible Reasons for Event: pt npo  Comments/MD notified: dr Chesley Mires, Stephani Police

## 2016-06-03 NOTE — Consult Note (Signed)
   [facility] Inpatient Consult   06/03/2016  MANVEER GOMES [birth year] [id number]   Patient is active with [facility] who follows the patient post hospital for transition of care calls.  Called and spoke with Caryl Pina at the office of the patient's hospitalization.  Chelsea was unavailable at this time.  Will follow up.  Natividad Brood, RN BSN [facility] Liaison  [phone] business mobile phone Toll free office [phone]

## 2016-06-03 NOTE — Progress Notes (Signed)
Progress Note  Patient Name: Brad Mendoza Date of Encounter: 06/03/2016  Primary Cardiologist: Bronson Ing  Subjective   Thinks he is wheezy this am   Inpatient Medications    Scheduled Meds: . aspirin EC  81 mg Oral Daily  . carvedilol  12.5 mg Oral BID WC  . enoxaparin (LOVENOX) injection  40 mg Subcutaneous Q24H  . fluticasone  2 spray Each Nare Daily  . furosemide  60 mg Intravenous BID  . gabapentin  300 mg Oral QHS  . insulin aspart  0-15 Units Subcutaneous TID WC  . insulin aspart  0-5 Units Subcutaneous QHS  . insulin glargine  40 Units Subcutaneous QHS  . isosorbide mononitrate  90 mg Oral Daily  . lisinopril  40 mg Oral Daily  . nicotine  14 mg Transdermal Daily  . omega-3 acid ethyl esters  1 g Oral Daily  . pantoprazole  40 mg Oral Daily  . rosuvastatin  20 mg Oral q1800  . sodium chloride flush  3 mL Intravenous Q12H  . sodium chloride flush  3 mL Intravenous Q12H   Continuous Infusions: . sodium chloride Stopped (06/03/16 0655)   PRN Meds: sodium chloride, sodium chloride, acetaminophen, albuterol, nitroGLYCERIN, ondansetron (ZOFRAN) IV, sodium chloride flush, sodium chloride flush   Vital Signs    Vitals:   06/02/16 0334 06/02/16 1949 06/03/16 0300 06/03/16 0749  BP: 134/80 125/82 122/77 (!) 144/87  Pulse: 76 76 73 76  Resp: 20 18 (!) 24 20  Temp: 98.4 F (36.9 C) 98.6 F (37 C) 98.1 F (36.7 C)   TempSrc:  Oral Oral   SpO2: 99% 100% 100%   Weight: 219 lb 4.8 oz (99.5 kg)  217 lb 9.6 oz (98.7 kg)   Height:        Intake/Output Summary (Last 24 hours) at 06/03/16 0810 Last data filed at 06/03/16 7829  Gross per 24 hour  Intake             1800 ml  Output             3650 ml  Net            -1850 ml   Filed Weights   06/01/16 2112 06/02/16 0334 06/03/16 0300  Weight: 219 lb 3.2 oz (99.4 kg) 219 lb 4.8 oz (99.5 kg) 217 lb 9.6 oz (98.7 kg)    Telemetry    NSR 06/03/2016  - Personally Reviewed  ECG    SR nonspecific ST changes  -  Personally Reviewed  Physical Exam  Overweight black male  GEN: No acute distress.   Neck: No JVD Cardiac: RRR, no murmurs, rubs, or gallops.  Respiratory: Clear to auscultation bilaterally.no objective wheezing  GI: Soft, nontender, non-distended  MS: No edema; No deformity. Neuro:  Nonfocal  Psych: Normal affect  Plus 2 LE edema bilaterally   Labs    Chemistry Recent Labs Lab 05/30/16 1059 06/01/16 1628 06/02/16 0321 06/03/16 0332  NA 142 140 139 138  K 4.2 4.5 4.0 3.9  CL 103 107 108 105  CO2 23 28 25 27   GLUCOSE 47* 181* 128* 175*  BUN 26 27* 28* 30*  CREATININE 1.19 1.57* 1.56* 1.54*  CALCIUM 8.3* 8.3* 8.0* 8.0*  PROT 5.1*  --   --   --   ALBUMIN 2.9*  --   --   --   AST 31  --   --   --   ALT 29  --   --   --  ALKPHOS 86  --   --   --   BILITOT 0.2  --   --   --   GFRNONAA 66 46* 47* 47*  GFRAA 76 54* 54* 55*  ANIONGAP  --  5 6 6      Hematology Recent Labs Lab 05/30/16 1059 06/01/16 1628 06/01/16 2123  WBC 7.3 5.8 6.4  RBC 4.49 4.06* 4.05*  HGB  --  11.0* 10.5*  HCT 36.9* 33.3* 32.9*  MCV 82 82.0 81.2  MCH 26.5* 27.1 25.9*  MCHC 32.2 33.0 31.9  RDW 14.9 13.9 13.9  PLT 287 236 229    Cardiac Enzymes Recent Labs Lab 06/01/16 1628 06/01/16 2123 06/02/16 0321 06/02/16 1123  TROPONINI <0.03 <0.03 <0.03 <0.03   No results for input(s): TROPIPOC in the last 168 hours.   BNP Recent Labs Lab 06/01/16 1628  BNP 240.0*     DDimer No results for input(s): DDIMER in the last 168 hours.   Radiology    Dg Chest 2 View  Result Date: 06/01/2016 CLINICAL DATA:  Exertional shortness of breath, left-sided chest discomfort, nonproductive cough, pain between the shoulder blades for several days. History of CHF, coronary artery disease, and diabetes. EXAM: CHEST  2 VIEW COMPARISON:  PA and lateral chest x-ray of June 01, 2016 at 2:17 p.m. Also PA and lateral chest x-ray of April 17, 2015. FINDINGS: The lungs are adequately inflated. Chronically  increased lung markings at the lung bases are noted, but these are more conspicuous than on the study of approximately 2 hours ago. There remains a small right pleural effusion. Bibasilar interstitial densities persist and are slightly more conspicuous. The cardiac silhouette is enlarged. The central pulmonary vascularity is more engorged. There is fluid in the minor fissure. The trachea is midline. IMPRESSION: Chronic interstitial lung disease. Interval worsening in the appearance of the lung bases consistent with interstitial edema secondary to CHF. Worsening of atelectasis or pneumonia is felt less likely. Persistent pleural fluid and pleural thickening at the right lung base. Electronically Signed   By: David  Martinique M.D.   On: 06/01/2016 16:34   Dg Chest 2 View  Result Date: 06/01/2016 CLINICAL DATA:  Shortness of breath, history of hypertension, CHF, diabetes, coronary artery disease EXAM: CHEST  2 VIEW COMPARISON:  PA and lateral chest x-ray of November 16, 2015 FINDINGS: The lungs are adequately inflated. There are bilateral pleural effusions greatest on the right. There is fluid within the fissures. The interstitial markings of both lungs are coarse but have improved since the previous study. The cardiac silhouette remains enlarged. The pulmonary vascularity is not clearly engorged. The bony thorax exhibits no acute abnormality. IMPRESSION: Chronic bronchitic changes with superimposed low-grade CHF. There are small bilateral pleural effusions greatest on the right. There is no alveolar pneumonia. Thoracic aortic atherosclerosis. Electronically Signed   By: David  Martinique M.D.   On: 06/01/2016 15:30    Cardiac Studies   Echo 02/03/16 personally reviewed EF 35-40%  ECG:  SR rate 93 nonspecific ST/T wave changes   Patient Profile     61 y.o. male admitted with atypical chest pain and dyspnea ? CHF vs URI BNP Elevated 240 for right and left cath today Has r/o   Assessment & Plan    Chest  Pain:  R/O ECG non specific ST changes for cath today CHF:  Echo pending likely non ischemic DCM continue iv bid lasix still with plus 2 LE edema further Rx based on filling pressures at cath Output 1.8  L's yesterday  Smoking : discussed cessation wearing patch in hospital Chol  On statin will need more intense Rx if CAD found  Lab Results  Component Value Date   LDLCALC 133 (H) 05/30/2016     Signed, Jenkins Rouge, MD  06/03/2016, 8:10 AM

## 2016-06-03 NOTE — Progress Notes (Signed)
Patient refused 2nd IV site for cath procedure. Patient educated.

## 2016-06-03 NOTE — Progress Notes (Signed)
7Fr femoral venous sheath aspirated and removed.  Pressure held for 15 minutes until hemostasis achieved.  Site level 0.  Tegaderm and gauze dressing applied to site.  Instructions given to patient.  Pt verbalizes understanding.    Bedrest begins at 2:45pm.

## 2016-06-03 NOTE — Progress Notes (Signed)
Inpatient Diabetes Program Recommendations  AACE/ADA: New Consensus Statement on Inpatient Glycemic Control (2015)  Target Ranges:  Prepandial:   less than 140 mg/dL      Peak postprandial:   less than 180 mg/dL (1-2 hours)      Critically ill patients:  140 - 180 mg/dL   Results for WILLET, SCHLEIFER (MRN [id number]) as of 06/03/2016 09:32  Ref. Range 06/02/2016 07:32 06/02/2016 11:23 06/02/2016 16:06 06/02/2016 19:57 06/03/2016 07:29 06/03/2016 08:10  Glucose-Capillary Latest Ref Range: 65 - 99 mg/dL 78 136 (H) 170 (H) 217 (H) 64 (L) 82    Review of Glycemic Control  Diabetes history: DM2 Outpatient Diabetes medications: Toujeo 70 units QAM, Humalog (per sliding scale) QHS Current orders for Inpatient glycemic control: Lantus 40 units QHS, Novolog 0-15 units TID with meals, Novolog 0-5 units QHS  Inpatient Diabetes Program Recommendations: HgbA1C: A1C 10.1% on 06/01/16 indicating an average glucose of 243 mg/dl over the past 2-3 months. Recommend patient follow up with Cherre Robins, RPh for DM control. Insulin-Basal: Fasting glucose 64 mg/dl this morning. Please consider decreasing Lantus to 35 units QHS.  Outpatient DM medications: At time of discharge, MD may want to consider adjusting Toujeo insulin dose and Humalog. Patient states that he recently started on Toujeo and has been have frequent hypoglycemia in the morning when waking up.   NOTE: Spoke with patient over the phone about diabetes and home regimen for diabetes control. Patient reports that he is followed by PCP (DM medication adjustments made by Cherre Robins, RPh) for diabetes management and currently he takes Toujeo 70 units QAM and Humalog QHS (per correction scale) as an outpatient for diabetes control. Patient states he is NOT taking Metformin. Patient reports that he is taking insulin as prescribed.  Patient last seen his PCP on 05/30/16. Patient reports that his basal insulin was recently changed from [place] to [place] (order  changed on 05/26/16 per chart review). Patient states that since he started taking the Toujeo he has been experiencing frequent hypoglycemia upon awakening in the mornings.  Patient reports that he has continued to take Toujeo and Humalog as prescribed despite experiencing hypoglycemia. Patient states that he checks his glucose 2-3 times per day and that it has ranged from 54-170 mg/dl over the past week. Per chart review noted patient's A1C was 14% in January 2018 and current A1C (10.1% on 06/01/16) and explained that his current A1C indicates an average glucose of 243 mg/dl over the past 2-3 months. Discussed glucose and A1C goals. Discussed importance of checking CBGs and maintaining good CBG control to prevent long-term and short-term complications. Explained how hyperglycemia leads to damage within blood vessels which lead to the common complications seen with uncontrolled diabetes. Stressed to the patient the importance of improving glycemic control to prevent further complications from uncontrolled diabetes. Discussed impact of nutrition, exercise, stress, sickness, and medications on diabetes control. Informed patient that usually Humalog is not taken at bedtime due to increased risk of hypoglycemia while sleeping. Explained to patient that per note by Cherre Robins, RPh on 03/28/16 patient should be taking Humalog TID with meals. Encouraged patient to make follow up appointment with Cherre Robins, RPh for assistance with DM medication adjustments and clarification. Encouraged patient to continue checking his glucose 3-4 times per day (before meals and at bedtime) and to keep a log book of glucose readings and insulin taken which he will need to take to doctor appointments. Explained how the doctor he follows up with can use the  log book to continue to make insulin adjustments if needed. Patient verbalized understanding of information discussed and he states that he has no further questions at this time related  to diabetes.  Thanks, Barnie Alderman, RN, MSN, CDE Diabetes Coordinator Inpatient Diabetes Program [phone] (Team Pager)

## 2016-06-03 NOTE — Interval H&P Note (Signed)
Cath Lab Visit (complete for each Cath Lab visit)  Clinical Evaluation Leading to the Procedure:   ACS: No.  Non-ACS:    Anginal Classification: CCS Mendoza  Anti-ischemic medical therapy: No Therapy  Non-Invasive Test Results: No non-invasive testing performed  Prior CABG: No previous CABG      History and Physical Interval Note:  06/03/2016 1:15 PM  Brad Mendoza  has presented today for surgery, with the diagnosis of hf - cp  The various methods of treatment have been discussed with the patient and family. After consideration of risks, benefits and other options for treatment, the patient has consented to  Procedure(s): Right/Left Heart Cath and Coronary Angiography (N/A) as a surgical intervention .  The patient's history has been reviewed, patient examined, no change in status, stable for surgery.  I have reviewed the patient's chart and labs.  Questions were answered to the patient's satisfaction.     Brad Mendoza

## 2016-06-03 NOTE — Progress Notes (Signed)
PROGRESS NOTE    Brad Mendoza  TMH:[id number] DOB: 08/08/1955 DOA: 06/01/2016 PCP: [name], MD    Brief Narrative:  Brad Mendoza is a 61 y.o. male with medical history significant of nonischemic cardiomyopathy with CHF, EF 35-40% in 12/17; HLD; HTN; DM; and CAD (LAD with 50% stenosis on remote cath) presenting with "My fluid been on my chest.  Like it was last time."  Called the doctor and went in for an appointment and they sent him here.   Assessment & Plan:   Principal Problem:   Unstable angina [facility]) - Cardiology consulted. Heart cath today. Awaiting final recommendations.  Active Problems:   Hyperlipemia - Continue Lovaza and Crestor    Tobacco dependence - Continue nicotine patch recommend cessation on discharge    Essential hypertension - lisinopril, imdur, carvedilol    Type 2 diabetes mellitus with complications (HCC) - Continue Lantus - Carb modified diet    CHF exacerbation ([place]) - Cardiology assisting - diuresis per cards  DVT prophylaxis:  Lovenox Code Status: Full Family Communication: None at bedside Disposition Plan: Pending final recommendations from cardiologist   Consultants:   Cardiology   Procedures: none   Antimicrobials: none   Subjective: Pt has no new complaints reported to me today.  Objective: Vitals:   06/03/16 1440 06/03/16 1445 06/03/16 1450 06/03/16 1515  BP: 115/60 (!) 142/83 (!) 142/89 139/88  Pulse: 67 68 72 77  Resp: 16 20 19 18   Temp:      TempSrc:      SpO2: 99% 99% 99% 99%  Weight:      Height:        Intake/Output Summary (Last 24 hours) at 06/03/16 1646 Last data filed at 06/03/16 1610  Gross per 24 hour  Intake              843 ml  Output             3625 ml  Net            -2782 ml   Filed Weights   06/01/16 2112 06/02/16 0334 06/03/16 0300  Weight: 99.4 kg (219 lb 3.2 oz) 99.5 kg (219 lb 4.8 oz) 98.7 kg (217 lb 9.6 oz)    Examination:  General exam: Appears calm and comfortable,  in nad. Respiratory system: Clear to auscultation. Respiratory effort normal. Cardiovascular system: S1 & S2 heard, RRR. No JVD, murmurs, rubs, Gastrointestinal system: Abdomen is nondistended, soft and nontender. No organomegaly or masses felt. Normal bowel sounds heard. Central nervous system: Alert and oriented. No focal neurological deficits. Extremities: Symmetric 5 x 5 power. Skin: No rashes, lesions or ulcers, on limited exam. Psychiatry: Judgement and insight appear normal.   Data Reviewed: I have personally reviewed following labs and imaging studies  CBC:  Recent Labs Lab 05/30/16 1059 06/01/16 1628 06/01/16 2123 06/03/16 1536  WBC 7.3 5.8 6.4 5.0  NEUTROABS 4.3  --  3.8  --   HGB  --  11.0* 10.5* 10.7*  HCT 36.9* 33.3* 32.9* 33.4*  MCV 82 82.0 81.2 81.9  PLT 287 236 229 921   Basic Metabolic Panel:  Recent Labs Lab 05/30/16 1059 06/01/16 1628 06/02/16 0321 06/03/16 0332 06/03/16 1536  NA 142 140 139 138  --   K 4.2 4.5 4.0 3.9  --   CL 103 107 108 105  --   CO2 23 28 25 27   --   GLUCOSE 47* 181* 128* 175*  --   BUN  26 27* 28* 30*  --   CREATININE 1.19 1.57* 1.56* 1.54* 1.55*  CALCIUM 8.3* 8.3* 8.0* 8.0*  --    GFR: Estimated Creatinine Clearance: 58.7 mL/min (A) (by C-G formula based on SCr of 1.55 mg/dL (H)). Liver Function Tests:  Recent Labs Lab 05/30/16 1059  AST 31  ALT 29  ALKPHOS 86  BILITOT 0.2  PROT 5.1*  ALBUMIN 2.9*   No results for input(s): LIPASE, AMYLASE in the last 168 hours. No results for input(s): AMMONIA in the last 168 hours. Coagulation Profile:  Recent Labs Lab 06/03/16 0438  INR 0.96   Cardiac Enzymes:  Recent Labs Lab 06/01/16 1628 06/01/16 2123 06/02/16 0321 06/02/16 1123  TROPONINI <0.03 <0.03 <0.03 <0.03   BNP (last 3 results) No results for input(s): PROBNP in the last 8760 hours. HbA1C:  Recent Labs  06/01/16 2123  HGBA1C 10.1*   CBG:  Recent Labs Lab 06/03/16 0729 06/03/16 0810  06/03/16 1017 06/03/16 1054 06/03/16 1557  GLUCAP 64* 82 59* 133* 131*   Lipid Profile: No results for input(s): CHOL, HDL, LDLCALC, TRIG, CHOLHDL, LDLDIRECT in the last 72 hours. Thyroid Function Tests:  Recent Labs  06/01/16 2123  TSH 3.282   Anemia Panel: No results for input(s): VITAMINB12, FOLATE, FERRITIN, TIBC, IRON, RETICCTPCT in the last 72 hours. Sepsis Labs: No results for input(s): PROCALCITON, LATICACIDVEN in the last 168 hours.  No results found for this or any previous visit (from the past 240 hour(s)).   Radiology Studies: No results found.  Scheduled Meds: . aspirin EC  81 mg Oral Daily  . carvedilol  12.5 mg Oral BID WC  . [START ON 06/04/2016] enoxaparin (LOVENOX) injection  40 mg Subcutaneous Q24H  . fluticasone  2 spray Each Nare Daily  . furosemide  60 mg Intravenous BID  . gabapentin  300 mg Oral QHS  . insulin aspart  0-15 Units Subcutaneous TID WC  . insulin aspart  0-5 Units Subcutaneous QHS  . insulin glargine  40 Units Subcutaneous QHS  . isosorbide mononitrate  90 mg Oral Daily  . lisinopril  40 mg Oral Daily  . nicotine  14 mg Transdermal Daily  . omega-3 acid ethyl esters  1 g Oral Daily  . pantoprazole  40 mg Oral Daily  . rosuvastatin  20 mg Oral q1800  . sodium chloride flush  3 mL Intravenous Q12H  . sodium chloride flush  3 mL Intravenous Q12H   Continuous Infusions:   LOS: 2 days    Time spent: > 35 minutes  Velvet Bathe, MD [facility] Pager [phone]  If 7PM-7AM, please contact night-coverage [email or web address] Password [facility] 06/03/2016, 4:46 PM

## 2016-06-03 NOTE — Progress Notes (Signed)
Hypoglycemic Event  CBG: 59  Treatment:1 amp d 5  Symptoms: pt stated he felt "jittery"  Follow-up CBG: [id number]:[id number] CBG Result:133  Possible Reasons for Event: pt npo  Comments/MD notified: dr Chesley Mires, [place]ph[place] Police

## 2016-06-03 NOTE — H&P (View-Only) (Signed)
Progress Note  Patient Name: Brad Mendoza Date of Encounter: 06/03/2016  Primary Cardiologist: Bronson Ing  Subjective   Thinks he is wheezy this am   Inpatient Medications    Scheduled Meds: . aspirin EC  81 mg Oral Daily  . carvedilol  12.5 mg Oral BID WC  . enoxaparin (LOVENOX) injection  40 mg Subcutaneous Q24H  . fluticasone  2 spray Each Nare Daily  . furosemide  60 mg Intravenous BID  . gabapentin  300 mg Oral QHS  . insulin aspart  0-15 Units Subcutaneous TID WC  . insulin aspart  0-5 Units Subcutaneous QHS  . insulin glargine  40 Units Subcutaneous QHS  . isosorbide mononitrate  90 mg Oral Daily  . lisinopril  40 mg Oral Daily  . nicotine  14 mg Transdermal Daily  . omega-3 acid ethyl esters  1 g Oral Daily  . pantoprazole  40 mg Oral Daily  . rosuvastatin  20 mg Oral q1800  . sodium chloride flush  3 mL Intravenous Q12H  . sodium chloride flush  3 mL Intravenous Q12H   Continuous Infusions: . sodium chloride Stopped (06/03/16 0655)   PRN Meds: sodium chloride, sodium chloride, acetaminophen, albuterol, nitroGLYCERIN, ondansetron (ZOFRAN) IV, sodium chloride flush, sodium chloride flush   Vital Signs    Vitals:   06/02/16 0334 06/02/16 1949 06/03/16 0300 06/03/16 0749  BP: 134/80 125/82 122/77 (!) 144/87  Pulse: 76 76 73 76  Resp: 20 18 (!) 24 20  Temp: 98.4 F (36.9 C) 98.6 F (37 C) 98.1 F (36.7 C)   TempSrc:  Oral Oral   SpO2: 99% 100% 100%   Weight: 219 lb 4.8 oz (99.5 kg)  217 lb 9.6 oz (98.7 kg)   Height:        Intake/Output Summary (Last 24 hours) at 06/03/16 0810 Last data filed at 06/03/16 5732  Gross per 24 hour  Intake             1800 ml  Output             3650 ml  Net            -1850 ml   Filed Weights   06/01/16 2112 06/02/16 0334 06/03/16 0300  Weight: 219 lb 3.2 oz (99.4 kg) 219 lb 4.8 oz (99.5 kg) 217 lb 9.6 oz (98.7 kg)    Telemetry    NSR 06/03/2016  - Personally Reviewed  ECG    SR nonspecific ST changes  -  Personally Reviewed  Physical Exam  Overweight black male  GEN: No acute distress.   Neck: No JVD Cardiac: RRR, no murmurs, rubs, or gallops.  Respiratory: Clear to auscultation bilaterally.no objective wheezing  GI: Soft, nontender, non-distended  MS: No edema; No deformity. Neuro:  Nonfocal  Psych: Normal affect  Plus 2 LE edema bilaterally   Labs    Chemistry Recent Labs Lab 05/30/16 1059 06/01/16 1628 06/02/16 0321 06/03/16 0332  NA 142 140 139 138  K 4.2 4.5 4.0 3.9  CL 103 107 108 105  CO2 23 28 25 27   GLUCOSE 47* 181* 128* 175*  BUN 26 27* 28* 30*  CREATININE 1.19 1.57* 1.56* 1.54*  CALCIUM 8.3* 8.3* 8.0* 8.0*  PROT 5.1*  --   --   --   ALBUMIN 2.9*  --   --   --   AST 31  --   --   --   ALT 29  --   --   --  ALKPHOS 86  --   --   --   BILITOT 0.2  --   --   --   GFRNONAA 66 46* 47* 47*  GFRAA 76 54* 54* 55*  ANIONGAP  --  5 6 6      Hematology Recent Labs Lab 05/30/16 1059 06/01/16 1628 06/01/16 2123  WBC 7.3 5.8 6.4  RBC 4.49 4.06* 4.05*  HGB  --  11.0* 10.5*  HCT 36.9* 33.3* 32.9*  MCV 82 82.0 81.2  MCH 26.5* 27.1 25.9*  MCHC 32.2 33.0 31.9  RDW 14.9 13.9 13.9  PLT 287 236 229    Cardiac Enzymes Recent Labs Lab 06/01/16 1628 06/01/16 2123 06/02/16 0321 06/02/16 1123  TROPONINI <0.03 <0.03 <0.03 <0.03   No results for input(s): TROPIPOC in the last 168 hours.   BNP Recent Labs Lab 06/01/16 1628  BNP 240.0*     DDimer No results for input(s): DDIMER in the last 168 hours.   Radiology    Dg Chest 2 View  Result Date: 06/01/2016 CLINICAL DATA:  Exertional shortness of breath, left-sided chest discomfort, nonproductive cough, pain between the shoulder blades for several days. History of CHF, coronary artery disease, and diabetes. EXAM: CHEST  2 VIEW COMPARISON:  PA and lateral chest x-ray of June 01, 2016 at 2:17 p.m. Also PA and lateral chest x-ray of April 17, 2015. FINDINGS: The lungs are adequately inflated. Chronically  increased lung markings at the lung bases are noted, but these are more conspicuous than on the study of approximately 2 hours ago. There remains a small right pleural effusion. Bibasilar interstitial densities persist and are slightly more conspicuous. The cardiac silhouette is enlarged. The central pulmonary vascularity is more engorged. There is fluid in the minor fissure. The trachea is midline. IMPRESSION: Chronic interstitial lung disease. Interval worsening in the appearance of the lung bases consistent with interstitial edema secondary to CHF. Worsening of atelectasis or pneumonia is felt less likely. Persistent pleural fluid and pleural thickening at the right lung base. Electronically Signed   By: David  Martinique M.D.   On: 06/01/2016 16:34   Dg Chest 2 View  Result Date: 06/01/2016 CLINICAL DATA:  Shortness of breath, history of hypertension, CHF, diabetes, coronary artery disease EXAM: CHEST  2 VIEW COMPARISON:  PA and lateral chest x-ray of November 16, 2015 FINDINGS: The lungs are adequately inflated. There are bilateral pleural effusions greatest on the right. There is fluid within the fissures. The interstitial markings of both lungs are coarse but have improved since the previous study. The cardiac silhouette remains enlarged. The pulmonary vascularity is not clearly engorged. The bony thorax exhibits no acute abnormality. IMPRESSION: Chronic bronchitic changes with superimposed low-grade CHF. There are small bilateral pleural effusions greatest on the right. There is no alveolar pneumonia. Thoracic aortic atherosclerosis. Electronically Signed   By: David  Martinique M.D.   On: 06/01/2016 15:30    Cardiac Studies   Echo 02/03/16 personally reviewed EF 35-40%  ECG:  SR rate 93 nonspecific ST/T wave changes   Patient Profile     61 y.o. male admitted with atypical chest pain and dyspnea ? CHF vs URI BNP Elevated 240 for right and left cath today Has r/o   Assessment & Plan    Chest  Pain:  R/O ECG non specific ST changes for cath today CHF:  Echo pending likely non ischemic DCM continue iv bid lasix still with plus 2 LE edema further Rx based on filling pressures at cath Output 1.8  L's yesterday  Smoking : discussed cessation wearing patch in hospital Chol  On statin will need more intense Rx if CAD found  Lab Results  Component Value Date   LDLCALC 133 (H) 05/30/2016     Signed, Jenkins Rouge, MD  06/03/2016, 8:10 AM

## 2016-06-03 NOTE — Progress Notes (Signed)
TR band was removed. Site looks well. Pt has no apparent distress. VS are stable. Groin site is well. No signs of bleeding at either site. Will continue to monitor pt.

## 2016-06-04 ENCOUNTER — Inpatient Hospital Stay (HOSPITAL_COMMUNITY): Payer: Medicare Other

## 2016-06-04 ENCOUNTER — Other Ambulatory Visit (HOSPITAL_COMMUNITY): Payer: Medicare Other

## 2016-06-04 DIAGNOSIS — E118 Type 2 diabetes mellitus with unspecified complications: Secondary | ICD-10-CM

## 2016-06-04 DIAGNOSIS — I10 Essential (primary) hypertension: Secondary | ICD-10-CM

## 2016-06-04 DIAGNOSIS — I209 Angina pectoris, unspecified: Secondary | ICD-10-CM

## 2016-06-04 DIAGNOSIS — Z794 Long term (current) use of insulin: Secondary | ICD-10-CM

## 2016-06-04 DIAGNOSIS — I5043 Acute on chronic combined systolic (congestive) and diastolic (congestive) heart failure: Secondary | ICD-10-CM

## 2016-06-04 DIAGNOSIS — I25708 Atherosclerosis of coronary artery bypass graft(s), unspecified, with other forms of angina pectoris: Secondary | ICD-10-CM

## 2016-06-04 LAB — BASIC METABOLIC PANEL
ANION GAP: 10 (ref 5–15)
BUN: 25 mg/dL — ABNORMAL HIGH (ref 6–20)
CALCIUM: 8.4 mg/dL — AB (ref 8.9–10.3)
CO2: 27 mmol/L (ref 22–32)
Chloride: 103 mmol/L (ref 101–111)
Creatinine, Ser: 1.37 mg/dL — ABNORMAL HIGH (ref 0.61–1.24)
GFR calc non Af Amer: 55 mL/min — ABNORMAL LOW (ref >60)
Glucose, Bld: 170 mg/dL — ABNORMAL HIGH (ref 65–99)
POTASSIUM: 4 mmol/L (ref 3.5–5.1)
Sodium: 140 mmol/L (ref 135–145)

## 2016-06-04 LAB — CBC
HCT: 30 % — ABNORMAL LOW (ref 39.0–52.0)
HEMOGLOBIN: 9.5 g/dL — AB (ref 13.0–17.0)
MCH: 25.7 pg — AB (ref 26.0–34.0)
MCHC: 31.7 g/dL (ref 30.0–36.0)
MCV: 81.3 fL (ref 78.0–100.0)
Platelets: 216 10*3/uL (ref 150–400)
RBC: 3.69 MIL/uL — AB (ref 4.22–5.81)
RDW: 13.6 % (ref 11.5–15.5)
WBC: 5 10*3/uL (ref 4.0–10.5)

## 2016-06-04 LAB — GLUCOSE, CAPILLARY
GLUCOSE-CAPILLARY: 103 mg/dL — AB (ref 65–99)
Glucose-Capillary: 122 mg/dL — ABNORMAL HIGH (ref 65–99)
Glucose-Capillary: 97 mg/dL (ref 65–99)
Glucose-Capillary: 126 mg/dL — ABNORMAL HIGH (ref 65–99)

## 2016-06-04 NOTE — Progress Notes (Signed)
PROGRESS NOTE    Brad Mendoza  EXB:[id number] DOB: [birth year] DOA: 06/01/2016 PCP: [name], MD    Brief Narrative:  Brad Mendoza is a 61 y.o. male with medical history significant of nonischemic cardiomyopathy with CHF, EF 35-40% in 12/17; HLD; HTN; DM; and CAD (LAD with 50% stenosis on remote cath) presenting with "My fluid been on my chest.  Like it was last time."  Called the doctor and went in for an appointment and they sent him here.   Assessment & Plan:   Principal Problem:   Unstable angina [facility]) - Cardiology consulted. Pt is s/p Heart cath. Cardiology does not think patient is good PCI candidate and plans currently are for medical management with surgical evaluation of CABG after diuresis  Active Problems:   Hyperlipemia - Continue Lovaza and Crestor    Tobacco dependence - Continue nicotine patch recommend cessation on discharge    Essential hypertension - lisinopril, imdur, carvedilol    Type 2 diabetes mellitus with complications (HCC) - Continue Lantus - Carb modified diet    CHF exacerbation ([place]) - Cardiology assisting - diuresis per cards. Plan is for continued diuresis as patient's dry weight is 198-200 lbs  DVT prophylaxis:  Lovenox Code Status: Full Family Communication: None at bedside Disposition Plan: Pending final recommendations from cardiologist   Consultants:   Cardiology   Procedures: none   Antimicrobials: none   Subjective: Pt does not express any new problems. States that the cardiologist answered his questions this am.  Objective: Vitals:   06/04/16 0500 06/04/16 0510 06/04/16 0700 06/04/16 0830  BP:  131/68 130/70 130/70  Pulse:    73  Resp:  20 20   Temp:  98.2 F (36.8 C) 98.4 F (36.9 C)   TempSrc:  Oral Oral   SpO2:   98%   Weight: 97.1 kg (214 lb)     Height:        Intake/Output Summary (Last 24 hours) at 06/04/16 1213 Last data filed at 06/04/16 1010  Gross per 24 hour  Intake               820 ml  Output             3050 ml  Net            -2230 ml   Filed Weights   06/02/16 0334 06/03/16 0300 06/04/16 0500  Weight: 99.5 kg (219 lb 4.8 oz) 98.7 kg (217 lb 9.6 oz) 97.1 kg (214 lb)    Examination:  General exam: Appears calm and comfortable, in nad. Respiratory system: Clear to auscultation. Respiratory effort normal. Cardiovascular system: S1 & S2 heard, RRR. No JVD, murmurs, rubs, Gastrointestinal system: Abdomen is nondistended, soft and nontender. No organomegaly or masses felt. Normal bowel sounds heard. Central nervous system: Alert and oriented. No focal neurological deficits. Extremities: Symmetric 5 x 5 power. Skin: No rashes, lesions or ulcers, on limited exam. Psychiatry: Judgement and insight appear normal.   Data Reviewed: I have personally reviewed following labs and imaging studies  CBC:  Recent Labs Lab 05/30/16 1059 06/01/16 1628 06/01/16 2123 06/03/16 1536 06/04/16 0407  WBC 7.3 5.8 6.4 5.0 5.0  NEUTROABS 4.3  --  3.8  --   --   HGB  --  11.0* 10.5* 10.7* 9.5*  HCT 36.9* 33.3* 32.9* 33.4* 30.0*  MCV 82 82.0 81.2 81.9 81.3  PLT 287 236 229 254 102   Basic Metabolic Panel:  Recent Labs Lab 05/30/16  1059 06/01/16 1628 06/02/16 0321 06/03/16 0332 06/03/16 1536 06/04/16 0407  NA 142 140 139 138  --  140  K 4.2 4.5 4.0 3.9  --  4.0  CL 103 107 108 105  --  103  CO2 23 28 25 27   --  27  GLUCOSE 47* 181* 128* 175*  --  170*  BUN 26 27* 28* 30*  --  25*  CREATININE 1.19 1.57* 1.56* 1.54* 1.55* 1.37*  CALCIUM 8.3* 8.3* 8.0* 8.0*  --  8.4*   GFR: Estimated Creatinine Clearance: 65.9 mL/min (A) (by C-G formula based on SCr of 1.37 mg/dL (H)). Liver Function Tests:  Recent Labs Lab 05/30/16 1059  AST 31  ALT 29  ALKPHOS 86  BILITOT 0.2  PROT 5.1*  ALBUMIN 2.9*   No results for input(s): LIPASE, AMYLASE in the last 168 hours. No results for input(s): AMMONIA in the last 168 hours. Coagulation Profile:  Recent Labs Lab  06/03/16 0438  INR 0.96   Cardiac Enzymes:  Recent Labs Lab 06/01/16 1628 06/01/16 2123 06/02/16 0321 06/02/16 1123  TROPONINI <0.03 <0.03 <0.03 <0.03   BNP (last 3 results) No results for input(s): PROBNP in the last 8760 hours. HbA1C:  Recent Labs  06/01/16 2123  HGBA1C 10.1*   CBG:  Recent Labs Lab 06/03/16 1054 06/03/16 1557 06/03/16 2040 06/04/16 0724 06/04/16 1109  GLUCAP 133* 131* 200* 122* 103*   Lipid Profile: No results for input(s): CHOL, HDL, LDLCALC, TRIG, CHOLHDL, LDLDIRECT in the last 72 hours. Thyroid Function Tests:  Recent Labs  06/01/16 2123  TSH 3.282   Anemia Panel: No results for input(s): VITAMINB12, FOLATE, FERRITIN, TIBC, IRON, RETICCTPCT in the last 72 hours. Sepsis Labs: No results for input(s): PROCALCITON, LATICACIDVEN in the last 168 hours.  No results found for this or any previous visit (from the past 240 hour(s)).   Radiology Studies: No results found.  Scheduled Meds: . aspirin EC  81 mg Oral Daily  . carvedilol  12.5 mg Oral BID WC  . enoxaparin (LOVENOX) injection  40 mg Subcutaneous Q24H  . fluticasone  2 spray Each Nare Daily  . furosemide  60 mg Intravenous BID  . gabapentin  300 mg Oral QHS  . insulin aspart  0-15 Units Subcutaneous TID WC  . insulin aspart  0-5 Units Subcutaneous QHS  . insulin glargine  40 Units Subcutaneous QHS  . isosorbide mononitrate  90 mg Oral Daily  . lisinopril  40 mg Oral Daily  . nicotine  14 mg Transdermal Daily  . omega-3 acid ethyl esters  1 g Oral Daily  . pantoprazole  40 mg Oral Daily  . rosuvastatin  20 mg Oral q1800  . sodium chloride flush  3 mL Intravenous Q12H  . sodium chloride flush  3 mL Intravenous Q12H   Continuous Infusions:   LOS: 3 days    Time spent: > 35 minutes  Velvet Bathe, MD [facility] Pager [phone]  If 7PM-7AM, please contact night-coverage [email or web address] Password [facility] 06/04/2016, 12:13 PM

## 2016-06-04 NOTE — Progress Notes (Signed)
Progress Note  Patient Name: Brad Mendoza Date of Encounter: 06/04/2016  Primary Cardiologist: Bronson Ing  Subjective   Cath results reviewed with patient. EF is lower by cath at 25%. Has 70% lesions in LAD and RCA. Uncertain benefit from PCI due to absence of angina/ACS and high risk for PCI with severe cardiomyopathy.  Net 6 L diuresis since admission. At [place] yesterday mean PAWP mildly elevated at 19. Creat better today at 1.37  He is describing soreness across his chest with walking, which I think is exertional angina. This is still present after diuresis.  Inpatient Medications    Scheduled Meds: . aspirin EC  81 mg Oral Daily  . carvedilol  12.5 mg Oral BID WC  . enoxaparin (LOVENOX) injection  40 mg Subcutaneous Q24H  . fluticasone  2 spray Each Nare Daily  . furosemide  60 mg Intravenous BID  . gabapentin  300 mg Oral QHS  . insulin aspart  0-15 Units Subcutaneous TID WC  . insulin aspart  0-5 Units Subcutaneous QHS  . insulin glargine  40 Units Subcutaneous QHS  . isosorbide mononitrate  90 mg Oral Daily  . lisinopril  40 mg Oral Daily  . nicotine  14 mg Transdermal Daily  . omega-3 acid ethyl esters  1 g Oral Daily  . pantoprazole  40 mg Oral Daily  . rosuvastatin  20 mg Oral q1800  . sodium chloride flush  3 mL Intravenous Q12H  . sodium chloride flush  3 mL Intravenous Q12H   Continuous Infusions:  PRN Meds: sodium chloride, sodium chloride, acetaminophen, albuterol, nitroGLYCERIN, ondansetron (ZOFRAN) IV, oxyCODONE-acetaminophen, sodium chloride flush, sodium chloride flush   Vital Signs    Vitals:   06/03/16 2012 06/04/16 0500 06/04/16 0510 06/04/16 0700  BP: 116/61  131/68 130/70  Pulse: 73     Resp: 19  20 20   Temp: 98.8 F (37.1 C)  98.2 F (36.8 C) 98.4 F (36.9 C)  TempSrc:   Oral Oral  SpO2: 99%   98%  Weight:  97.1 kg (214 lb)    Height:        Intake/Output Summary (Last 24 hours) at 06/04/16 0852 Last data filed at 06/04/16  0850  Gross per 24 hour  Intake              823 ml  Output             2800 ml  Net            -1977 ml   Filed Weights   06/02/16 0334 06/03/16 0300 06/04/16 0500  Weight: 99.5 kg (219 lb 4.8 oz) 98.7 kg (217 lb 9.6 oz) 97.1 kg (214 lb)    Telemetry    NSR - Personally Reviewed  ECG    NSR, no Q waves, lateral T wave flattening - Personally Reviewed  Physical Exam  Comfortable lying flat GEN: No acute distress.   Neck: No JVD Cardiac: RRR, no murmurs, rubs, or gallops.  Respiratory: Clear to auscultation bilaterally. GI: Soft, nontender, non-distended  MS: No edema; No deformity. Neuro:  Nonfocal  Psych: Normal affect   Labs    Chemistry Recent Labs Lab 05/30/16 1059  06/02/16 0321 06/03/16 0332 06/03/16 1536 06/04/16 0407  NA 142  < > 139 138  --  140  K 4.2  < > 4.0 3.9  --  4.0  CL 103  < > 108 105  --  103  CO2 23  < > 25  27  --  27  GLUCOSE 47*  < > 128* 175*  --  170*  BUN 26  < > 28* 30*  --  25*  CREATININE 1.19  < > 1.56* 1.54* 1.55* 1.37*  CALCIUM 8.3*  < > 8.0* 8.0*  --  8.4*  PROT 5.1*  --   --   --   --   --   ALBUMIN 2.9*  --   --   --   --   --   AST 31  --   --   --   --   --   ALT 29  --   --   --   --   --   ALKPHOS 86  --   --   --   --   --   BILITOT 0.2  --   --   --   --   --   GFRNONAA 66  < > 47* 47* 47* 55*  GFRAA 76  < > 54* 55* 54* >60  ANIONGAP  --   < > 6 6  --  10  < > = values in this interval not displayed.   Hematology Recent Labs Lab 06/01/16 2123 06/03/16 1536 06/04/16 0407  WBC 6.4 5.0 5.0  RBC 4.05* 4.08* 3.69*  HGB 10.5* 10.7* 9.5*  HCT 32.9* 33.4* 30.0*  MCV 81.2 81.9 81.3  MCH 25.9* 26.2 25.7*  MCHC 31.9 32.0 31.7  RDW 13.9 13.6 13.6  PLT 229 254 216    Cardiac Enzymes Recent Labs Lab 06/01/16 1628 06/01/16 2123 06/02/16 0321 06/02/16 1123  TROPONINI <0.03 <0.03 <0.03 <0.03   No results for input(s): TROPIPOC in the last 168 hours.   BNP Recent Labs Lab 06/01/16 1628  BNP 240.0*       Cardiac Studies  ECHO 02/03/2016 - Left ventricle: The cavity size was mildly dilated. Wall thickness was normal. Systolic function was moderately reduced. The estimated ejection fraction was in the range of 35% to 40%. Diffuse hypokinesis. Features are consistent with a pseudonormal left ventricular filling pattern, with concomitant abnormal relaxation and increased filling pressure (grade 2 diastolic dysfunction). Doppler parameters are consistent with high   ventricular filling pressure. - Aortic valve: Mildly calcified annulus. Mildly thickened leaflets. - Left atrium: The atrium was severely dilated. - Right atrium: The atrium was mildly dilated. - Atrial septum: No defect or patent foramen ovale was identified. - Pulmonary arteries: Systolic pressure was moderately increased. PA peak pressure: 42 mm Hg (S).   CATH 06/03/16 Diagnostic Diagram      Conclusion    Moderately severe proximal and mid LAD stenoses which are eccentric and in the 70-75% range depending upon review. Greater than 90% ostial stenosis in the first diagonal (large in distribution). The body of the second diagonal (small in distribution) contains 70% stenosis.  60-70% mid RCA stenosis.  No significant obstruction in the circumflex coronary artery.  Severe dilatation and global dysfunction of the left ventricle with EF less than 25%. Mildly elevated filling pressures.  RECOMMENDATIONS:   Guideline mandated medical therapy for heart failure.  Aggressive risk factor modification for coronary disease.  In absence of unstable angina/acute coronary syndrome, best approach for coronary disease is medical therapy.  If unstable angina/preinfarction syndrome, consider coronary PCI of LAD and or right coronary lesions. Diagonal lesion would be difficult to treat independent of LAD therapy. PCI would likely require hemodynamic support due to severe LV dysfunction.   PAWP 19, LVEDP 25  PAP 51/23 (mean 33) CO  5.9 L/min (index 2.8)  Patient Profile     61 y.o. male with preexisting CMP, now with progression to severe LV dysfunction and acute heart failure exacerbation, has moderately severe LAD and RCA stenoses and effort angina. No Q waves on ECG.  Assessment & Plan    1. CHF (systolic and diastolic acute on chronic): improved, approaching euvolemia by cath results, but way above his estimated usual weight of 198-200 lb on home scale. Continue diuresis. 2. CAD: 2-vessel with proximal LAD and high grade bifurcation diagonal stenosis. He does have angina. As described in Dr. Thompson Caul conclusion, not a great option for PCI. He has depressed EF and insulin requiring DM. Older nuclear scan suggested a fixed anterior defect, but he does not have Q waves. He may be best served by CABG. Consider MRI for myocardial viability. Will ask for surgical evaluation after we diurese fully this weekend. 3. PAH: note mildly elevated transpulmonary gradient (14 mm Hg) suggesting a component of fixed arteriolar disease (OSA? Never smoker).  Signed, Sanda Klein, MD  06/04/2016, 8:52 AM

## 2016-06-05 DIAGNOSIS — I272 Pulmonary hypertension, unspecified: Secondary | ICD-10-CM

## 2016-06-05 DIAGNOSIS — I428 Other cardiomyopathies: Secondary | ICD-10-CM

## 2016-06-05 LAB — GLUCOSE, CAPILLARY
GLUCOSE-CAPILLARY: 248 mg/dL — AB (ref 65–99)
Glucose-Capillary: 90 mg/dL (ref 65–99)
Glucose-Capillary: 129 mg/dL — ABNORMAL HIGH (ref 65–99)
Glucose-Capillary: 231 mg/dL — ABNORMAL HIGH (ref 65–99)

## 2016-06-05 NOTE — Progress Notes (Signed)
Progress Note  Patient Name: Brad Mendoza Date of Encounter: 06/05/2016  Primary Cardiologist: Bronson Ing  Subjective   Additional > 1 L net diuresis. Still roughly 10 pounds above his estimated dry weight of 198 pounds. Orthopnea has resolved. Essentially asymptomatic at rest but develops chest soreness when he walks the halls. Leg edema disappeared after overnight recumbent position, but has recurred quickly once he stood up. Labs were not drawn today.  Inpatient Medications    Scheduled Meds: . aspirin EC  81 mg Oral Daily  . carvedilol  12.5 mg Oral BID WC  . enoxaparin (LOVENOX) injection  40 mg Subcutaneous Q24H  . fluticasone  2 spray Each Nare Daily  . furosemide  60 mg Intravenous BID  . gabapentin  300 mg Oral QHS  . insulin aspart  0-15 Units Subcutaneous TID WC  . insulin aspart  0-5 Units Subcutaneous QHS  . insulin glargine  40 Units Subcutaneous QHS  . isosorbide mononitrate  90 mg Oral Daily  . lisinopril  40 mg Oral Daily  . nicotine  14 mg Transdermal Daily  . omega-3 acid ethyl esters  1 g Oral Daily  . pantoprazole  40 mg Oral Daily  . rosuvastatin  20 mg Oral q1800  . sodium chloride flush  3 mL Intravenous Q12H  . sodium chloride flush  3 mL Intravenous Q12H   Continuous Infusions:  PRN Meds: sodium chloride, sodium chloride, acetaminophen, albuterol, nitroGLYCERIN, ondansetron (ZOFRAN) IV, oxyCODONE-acetaminophen, sodium chloride flush, sodium chloride flush   Vital Signs    Vitals:   06/04/16 1725 06/04/16 2005 06/05/16 0442 06/05/16 1041  BP: 125/79 121/73 129/78 120/70  Pulse: 69 68 70 61  Resp:  15 14 19   Temp:  98.3 F (36.8 C) 98.2 F (36.8 C)   TempSrc:      SpO2:  100% 98% 99%  Weight:   94.4 kg (208 lb 3.2 oz)   Height:        Intake/Output Summary (Last 24 hours) at 06/05/16 1106 Last data filed at 06/05/16 1000  Gross per 24 hour  Intake             1080 ml  Output             2500 ml  Net            -1420 ml    Filed Weights   06/03/16 0300 06/04/16 0500 06/05/16 0442  Weight: 98.7 kg (217 lb 9.6 oz) 97.1 kg (214 lb) 94.4 kg (208 lb 3.2 oz)    Telemetry    NSR - Personally Reviewed  ECG    NSR, no Q waves, lateral T wave flattening - Personally Reviewed  Physical Exam  Comfortable GEN: No acute distress.   Neck: No JVD Cardiac: RRR, no murmurs, rubs, or gallops. Symmetrical 1-2 plus ankle edema Respiratory: Clear to auscultation bilaterally. GI: Soft, nontender, non-distended  MS: No edema; No deformity. Neuro:  Nonfocal  Psych: Normal affect   Labs    Chemistry Recent Labs Lab 05/30/16 1059  06/02/16 0321 06/03/16 0332 06/03/16 1536 06/04/16 0407  NA 142  < > 139 138  --  140  K 4.2  < > 4.0 3.9  --  4.0  CL 103  < > 108 105  --  103  CO2 23  < > 25 27  --  27  GLUCOSE 47*  < > 128* 175*  --  170*  BUN 26  < > 28* 30*  --  25*  CREATININE 1.19  < > 1.56* 1.54* 1.55* 1.37*  CALCIUM 8.3*  < > 8.0* 8.0*  --  8.4*  PROT 5.1*  --   --   --   --   --   ALBUMIN 2.9*  --   --   --   --   --   AST 31  --   --   --   --   --   ALT 29  --   --   --   --   --   ALKPHOS 86  --   --   --   --   --   BILITOT 0.2  --   --   --   --   --   GFRNONAA 66  < > 47* 47* 47* 55*  GFRAA 76  < > 54* 55* 54* >60  ANIONGAP  --   < > 6 6  --  10  < > = values in this interval not displayed.   Hematology Recent Labs Lab 06/01/16 2123 06/03/16 1536 06/04/16 0407  WBC 6.4 5.0 5.0  RBC 4.05* 4.08* 3.69*  HGB 10.5* 10.7* 9.5*  HCT 32.9* 33.4* 30.0*  MCV 81.2 81.9 81.3  MCH 25.9* 26.2 25.7*  MCHC 31.9 32.0 31.7  RDW 13.9 13.6 13.6  PLT 229 254 216    Cardiac Enzymes Recent Labs Lab 06/01/16 1628 06/01/16 2123 06/02/16 0321 06/02/16 1123  TROPONINI <0.03 <0.03 <0.03 <0.03   No results for input(s): TROPIPOC in the last 168 hours.   BNP Recent Labs Lab 06/01/16 1628  BNP 240.0*     DDimer No results for input(s): DDIMER in the last 168 hours.   Radiology    No  results found.  Cardiac Studies   ECHO 02/03/2016 - Left ventricle: The cavity size was mildly dilated. Wallthickness was normal. Systolic function was moderately reduced.The estimated ejection fraction was in the range of 35% to 40%.Diffuse hypokinesis. Features are consistent with a pseudonormalleft ventricular filling pattern, with concomitant abnormalrelaxation and increased filling pressure (grade 2 diastolicdysfunction). Doppler parameters are consistent with high ventricular filling pressure. - Aortic valve: Mildly calcified annulus. Mildly thickenedleaflets. - Left atrium: The atrium was severely dilated. - Right atrium: The atrium was mildly dilated. - Atrial septum: No defect or patent foramen ovale was identified. - Pulmonary arteries: Systolic pressure was moderately increased.PA peak pressure: 42 mm Hg (S).   CATH 06/03/16 Diagnostic Diagram      Conclusion    Moderately severe proximal and mid LAD stenoses which are eccentric and in the 70-75% range depending upon review. Greater than 90% ostial stenosis in the first diagonal (large in distribution). The body of the second diagonal (small in distribution) contains 70% stenosis.  60-70% mid RCA stenosis.  No significant obstruction in the circumflex coronary artery.  Severe dilatation and global dysfunction of the left ventricle with EF less than 25%. Mildly elevated filling pressures.  RECOMMENDATIONS:   Guideline mandated medical therapy for heart failure.  Aggressive risk factor modification for coronary disease.  In absence of unstable angina/acute coronary syndrome, best approach for coronary disease is medical therapy.  If unstable angina/preinfarction syndrome, consider coronary PCI of LAD and or right coronary lesions. Diagonal lesion would be difficult to treat independent of LAD therapy. PCI would likely require hemodynamic support due to severe LV dysfunction.   PAWP 19, LVEDP 25 PAP  51/23 (mean 33) CO 5.9 L/min (index 2.8)   Patient Profile  61 y.o. male with preexisting CMP, now with progression to severe LV dysfunction and acute heart failure exacerbation, has moderately severe LAD and RCA stenoses and effort angina. No Q waves on ECG. Benefits of revascularization uncertain and PCI felt to be high risk due to severe LV dysfunction   Assessment & Plan    1. CHF (systolic and diastolic acute on chronic): improved, approaching euvolemia by cath results, but way above his estimated usual weight of 198-200 lb on home scale. Continue IV diuresis.Check renal function and BNP in a.m. 2. CAD: 2-vessel with proximal LAD and high grade bifurcation diagonal stenosis. He does have angina, but appears to have stable/exertional symptoms. On high-dose nitrates and recently increased carvedilol dose. Consider Ranexa.  As described in Dr. Thompson Caul conclusion, not a great option for PCI. He has depressed EF and insulin requiring DM. Older nuclear scan suggested a fixed anterior defect, but he does not have Q waves. He may be best served by CABG. Consider MRI for myocardial viability or CT-FFR. Will ask for surgical evaluation after we diurese fully this weekend. 3. PAH: note mildly elevated transpulmonary gradient (14 mm Hg) suggesting a component of fixed arteriolar disease (OSA? Never smoker).  Signed, Sanda Klein, MD  06/05/2016, 11:06 AM

## 2016-06-05 NOTE — Progress Notes (Signed)
PROGRESS NOTE    Brad Mendoza  YYQ:[id number] DOB: 25-Jun-1955 DOA: 06/01/2016 PCP: [name], MD    Brief Narrative:  Brad Mendoza is a 61 y.o. male with medical history significant of nonischemic cardiomyopathy with CHF, EF 35-40% in 12/17; HLD; HTN; DM; and CAD (LAD with 50% stenosis on remote cath) presenting with "My fluid been on my chest.  Like it was last time."  Called the doctor and went in for an appointment and they sent him here.  Assessment & Plan:   Principal Problem:   Unstable angina [facility]) - Cardiology consulted. Pt is s/p Heart cath. Cardiology does not think patient is good PCI candidate and plans currently are for medical management with surgical evaluation of CABG after diuresis  Active Problems:   Hyperlipemia - Continue Lovaza and Crestor    Tobacco dependence - Continue nicotine patch recommend cessation on discharge    Essential hypertension - lisinopril, imdur, carvedilol    Type 2 diabetes mellitus with complications (HCC) - Continue Lantus - Carb modified diet    CHF exacerbation ([place]) - Cardiology assisting - diuresis per cards. Plan is for continued diuresis as patient's dry weight is 198-200 lbs  DVT prophylaxis:  Lovenox Code Status: Full Family Communication: None at bedside Disposition Plan: Pending final recommendations from cardiologist   Consultants:   Cardiology   Procedures: none   Antimicrobials: none   Subjective: Pt reports no new complaints.  Objective: Vitals:   06/04/16 2005 06/05/16 0442 06/05/16 1041 06/05/16 1339  BP: 121/73 129/78 120/70 (!) 131/91  Pulse: 68 70 61 74  Resp: 15 14 19  (!) 22  Temp: 98.3 F (36.8 C) 98.2 F (36.8 C)  98.2 F (36.8 C)  TempSrc:    Oral  SpO2: 100% 98% 99% 94%  Weight:  94.4 kg (208 lb 3.2 oz)    Height:        Intake/Output Summary (Last 24 hours) at 06/05/16 1407 Last data filed at 06/05/16 1350  Gross per 24 hour  Intake             1200 ml  Output              2800 ml  Net            -1600 ml   Filed Weights   06/03/16 0300 06/04/16 0500 06/05/16 0442  Weight: 98.7 kg (217 lb 9.6 oz) 97.1 kg (214 lb) 94.4 kg (208 lb 3.2 oz)    Examination:  General exam: Appears calm and comfortable, in nad. Respiratory system: Clear to auscultation. Respiratory effort normal. Cardiovascular system: S1 & S2 heard, RRR. No JVD, murmurs, rubs, Gastrointestinal system: Abdomen is nondistended, soft and nontender. No organomegaly or masses felt. Normal bowel sounds heard. Central nervous system: Alert and oriented. No focal neurological deficits. Extremities: Symmetric 5 x 5 power. Skin: No rashes, lesions or ulcers, on limited exam. Psychiatry: Judgement and insight appear normal.   Data Reviewed: I have personally reviewed following labs and imaging studies  CBC:  Recent Labs Lab 05/30/16 1059 06/01/16 1628 06/01/16 2123 06/03/16 1536 06/04/16 0407  WBC 7.3 5.8 6.4 5.0 5.0  NEUTROABS 4.3  --  3.8  --   --   HGB  --  11.0* 10.5* 10.7* 9.5*  HCT 36.9* 33.3* 32.9* 33.4* 30.0*  MCV 82 82.0 81.2 81.9 81.3  PLT 287 236 229 254 888   Basic Metabolic Panel:  Recent Labs Lab 05/30/16 1059 06/01/16 1628 06/02/16 0321 06/03/16  8527 06/03/16 1536 06/04/16 0407  NA 142 140 139 138  --  140  K 4.2 4.5 4.0 3.9  --  4.0  CL 103 107 108 105  --  103  CO2 23 28 25 27   --  27  GLUCOSE 47* 181* 128* 175*  --  170*  BUN 26 27* 28* 30*  --  25*  CREATININE 1.19 1.57* 1.56* 1.54* 1.55* 1.37*  CALCIUM 8.3* 8.3* 8.0* 8.0*  --  8.4*   GFR: Estimated Creatinine Clearance: 65 mL/min (A) (by C-G formula based on SCr of 1.37 mg/dL (H)). Liver Function Tests:  Recent Labs Lab 05/30/16 1059  AST 31  ALT 29  ALKPHOS 86  BILITOT 0.2  PROT 5.1*  ALBUMIN 2.9*   No results for input(s): LIPASE, AMYLASE in the last 168 hours. No results for input(s): AMMONIA in the last 168 hours. Coagulation Profile:  Recent Labs Lab 06/03/16 0438  INR 0.96     Cardiac Enzymes:  Recent Labs Lab 06/01/16 1628 06/01/16 2123 06/02/16 0321 06/02/16 1123  TROPONINI <0.03 <0.03 <0.03 <0.03   BNP (last 3 results) No results for input(s): PROBNP in the last 8760 hours. HbA1C: No results for input(s): HGBA1C in the last 72 hours. CBG:  Recent Labs Lab 06/04/16 1109 06/04/16 1605 06/04/16 2117 06/05/16 0725 06/05/16 1045  GLUCAP 103* 97 126* 90 129*   Lipid Profile: No results for input(s): CHOL, HDL, LDLCALC, TRIG, CHOLHDL, LDLDIRECT in the last 72 hours. Thyroid Function Tests: No results for input(s): TSH, T4TOTAL, FREET4, T3FREE, THYROIDAB in the last 72 hours. Anemia Panel: No results for input(s): VITAMINB12, FOLATE, FERRITIN, TIBC, IRON, RETICCTPCT in the last 72 hours. Sepsis Labs: No results for input(s): PROCALCITON, LATICACIDVEN in the last 168 hours.  No results found for this or any previous visit (from the past 240 hour(s)).   Radiology Studies: No results found.  Scheduled Meds: . aspirin EC  81 mg Oral Daily  . carvedilol  12.5 mg Oral BID WC  . enoxaparin (LOVENOX) injection  40 mg Subcutaneous Q24H  . fluticasone  2 spray Each Nare Daily  . furosemide  60 mg Intravenous BID  . gabapentin  300 mg Oral QHS  . insulin aspart  0-15 Units Subcutaneous TID WC  . insulin aspart  0-5 Units Subcutaneous QHS  . insulin glargine  40 Units Subcutaneous QHS  . isosorbide mononitrate  90 mg Oral Daily  . lisinopril  40 mg Oral Daily  . nicotine  14 mg Transdermal Daily  . omega-3 acid ethyl esters  1 g Oral Daily  . pantoprazole  40 mg Oral Daily  . rosuvastatin  20 mg Oral q1800  . sodium chloride flush  3 mL Intravenous Q12H  . sodium chloride flush  3 mL Intravenous Q12H   Continuous Infusions:   LOS: 4 days    Time spent: > 35 minutes  Velvet Bathe, MD [facility] Pager [phone]  If 7PM-7AM, please contact night-coverage [email or web address] Password [facility] 06/05/2016, 2:07 PM

## 2016-06-06 ENCOUNTER — Encounter (HOSPITAL_COMMUNITY): Payer: Self-pay | Admitting: Interventional Cardiology

## 2016-06-06 DIAGNOSIS — I428 Other cardiomyopathies: Secondary | ICD-10-CM

## 2016-06-06 DIAGNOSIS — I509 Heart failure, unspecified: Secondary | ICD-10-CM

## 2016-06-06 DIAGNOSIS — I2510 Atherosclerotic heart disease of native coronary artery without angina pectoris: Secondary | ICD-10-CM

## 2016-06-06 LAB — POCT ACTIVATED CLOTTING TIME: Activated Clotting Time: 158 seconds

## 2016-06-06 LAB — BASIC METABOLIC PANEL
Anion gap: 8 (ref 5–15)
BUN: 30 mg/dL — AB (ref 6–20)
CHLORIDE: 101 mmol/L (ref 101–111)
CO2: 30 mmol/L (ref 22–32)
Calcium: 8.2 mg/dL — ABNORMAL LOW (ref 8.9–10.3)
Creatinine, Ser: 1.51 mg/dL — ABNORMAL HIGH (ref 0.61–1.24)
GFR calc Af Amer: 56 mL/min — ABNORMAL LOW (ref >60)
GFR calc non Af Amer: 49 mL/min — ABNORMAL LOW (ref >60)
GLUCOSE: 125 mg/dL — AB (ref 65–99)
POTASSIUM: 4.2 mmol/L (ref 3.5–5.1)
SODIUM: 139 mmol/L (ref 135–145)

## 2016-06-06 LAB — GLUCOSE, CAPILLARY
GLUCOSE-CAPILLARY: 137 mg/dL — AB (ref 65–99)
Glucose-Capillary: 104 mg/dL — ABNORMAL HIGH (ref 65–99)
Glucose-Capillary: 144 mg/dL — ABNORMAL HIGH (ref 65–99)
Glucose-Capillary: 165 mg/dL — ABNORMAL HIGH (ref 65–99)

## 2016-06-06 LAB — POCT I-STAT 3, VENOUS BLOOD GAS (G3P V)
Acid-Base Excess: 3 mmol/L — ABNORMAL HIGH (ref 0.0–2.0)
Bicarbonate: 28.2 mmol/L — ABNORMAL HIGH (ref 20.0–28.0)
O2 SAT: 60 %
PCO2 VEN: 47 mmHg (ref 44.0–60.0)
PO2 VEN: 32 mmHg (ref 32.0–45.0)
TCO2: 30 mmol/L (ref 0–100)
pH, Ven: 7.387 (ref 7.250–7.430)

## 2016-06-06 LAB — POCT I-STAT 3, ART BLOOD GAS (G3+)
Acid-Base Excess: 1 mmol/L (ref 0.0–2.0)
BICARBONATE: 25.3 mmol/L (ref 20.0–28.0)
O2 Saturation: 94 %
PCO2 ART: 36.7 mmHg (ref 32.0–48.0)
PH ART: 7.445 (ref 7.350–7.450)
TCO2: 26 mmol/L (ref 0–100)
pO2, Arterial: 66 mmHg — ABNORMAL LOW (ref 83.0–108.0)

## 2016-06-06 LAB — TROPONIN I

## 2016-06-06 LAB — BRAIN NATRIURETIC PEPTIDE: B Natriuretic Peptide: 137.1 pg/mL — ABNORMAL HIGH (ref 0.0–100.0)

## 2016-06-06 MED ORDER — LOSARTAN POTASSIUM 50 MG PO TABS: 50.0000 mg | ORAL_TABLET | Freq: Every day | ORAL | Status: DC

## 2016-06-06 MED ORDER — FUROSEMIDE 10 MG/ML IJ SOLN: 80.0000 mg | Freq: Two times a day (BID) | INTRAMUSCULAR | Status: DC

## 2016-06-06 MED ORDER — LOSARTAN POTASSIUM 50 MG PO TABS
100.0000 mg | ORAL_TABLET | Freq: Every day | ORAL | Status: AC
  Administered 2016-06-07 – 2016-06-08 (×2): 100 mg via ORAL
  Filled 2016-06-06 (×2): qty 2

## 2016-06-06 MED ORDER — SPIRONOLACTONE 25 MG PO TABS
12.5000 mg | ORAL_TABLET | Freq: Every day | ORAL | Status: DC
  Administered 2016-06-06 – 2016-06-08 (×3): 12.5 mg via ORAL
  Filled 2016-06-06 (×3): qty 1

## 2016-06-06 MED ORDER — FUROSEMIDE 40 MG PO TABS
60.0000 mg | ORAL_TABLET | Freq: Two times a day (BID) | ORAL | Status: DC
  Administered 2016-06-07 – 2016-06-08 (×3): 60 mg via ORAL
  Filled 2016-06-06 (×3): qty 1

## 2016-06-06 MED ORDER — LOSARTAN POTASSIUM 50 MG PO TABS: 100.0000 mg | ORAL_TABLET | Freq: Every day | ORAL | Status: DC

## 2016-06-06 NOTE — Progress Notes (Signed)
Inpatient Diabetes Program Recommendations  AACE/ADA: New Consensus Statement on Inpatient Glycemic Control (2015)  Target Ranges:  Prepandial:   less than 140 mg/dL      Peak postprandial:   less than 180 mg/dL (1-2 hours)      Critically ill patients:  140 - 180 mg/dL   Lab Results  Component Value Date   GLUCAP 104 (H) 06/06/2016   HGBA1C 10.1 (H) 06/01/2016    Review of Glycemic Control Results for KIMBERLEY, DASTRUP (MRN [id number]) as of 06/06/2016 11:09  Ref. Range 06/05/2016 10:45 06/05/2016 16:23 06/05/2016 21:26 06/06/2016 07:19  Glucose-Capillary Latest Ref Range: 65 - 99 mg/dL 129 (H) 248 (H) 231 (H) 104 (H)   Diabetes history: DM2 Outpatient Diabetes medications: Toujeo 70 units QAM, Humalog (per sliding scale) QHS Current orders for Inpatient glycemic control: Lantus 40 units QHS, Novolog 0-15 units TID with meals, Novolog 0-5 units QHS  Inpatient Diabetes Program Recommendations: Please consider Novolog 3 units meal coverage tid if eats 50%.  Thank you, Nani Gasser. Surya Schroeter, RN, MSN, CDE  Diabetes Coordinator Inpatient Glycemic Control Team Team Pager [phone] (8am-5pm) 06/06/2016 11:11 AM

## 2016-06-06 NOTE — Consult Note (Signed)
[address]       [place] [address]             [phone]        Brad Mendoza Medical Record #[id number] Date of Birth: 1955-04-11  Referring Dr Sallyanne Kuster Primary Care: [name], MD  Chief Complaint:    Chief Complaint  Patient presents with  . Shortness of Breath  . Chest Pain  Patient examined, coronary angiogram and most recent echocardiogram personally reviewed and counseled with patient   History of Present Illness:     I was asked to see the patient regarding recent diagnosis of progression and coronary artery disease. The patient is a 61 year old AA male who was admitted to the hospital for congestive heart failure for the second time in 4 months. He was previously diagnosed as having nonischemic cardiomyopathy with mild prior stenosis of the LAD with reduced LVEF 10 years ago. EF was 35% 4 months ago. Despite an attempt at medical therapy the patient had progression in his symptoms of heart failure, possibly related to dietary indiscretion and increased sodium intake over the Easter holiday. He was reevaluated with repeat cardiac catheterization on this admission which shows now a 80-90 percent proximal LAD stenosis. EF is worse at 20-25 percent. LVEDP is 25. Cardiac index was maintained at greater than 2.2. PA pressures were moderately elevated. The patient has been seen in consultation by the advanced heart failure service and changes to his medical regimen have been performed. The patient significantly feels better after diuresis of 20 pounds. His now significant LAD stenosis was assessed by interventional radiology for possible PCI. Cardiology recommendation was to not proceed with PCI due to severe LV dysfunction and coronary anatomy and medical therapy was recommended.  The patient has had no significant previous surgical history. He has no strong family history of CAD or CABG and family. He does have some exertional chest  tightness associated with position exertional shortness of breath but that has improved with treatments of his heart failure. The patient has not had a myocardial viability study however this would probably be unrevealing with nonischemic cardiomyopathy.  The patient basically has single-vessel coronary disease with underlying severe nonischemic cardiomyopathy. I would not recommend CABG in this patient because I feel CABG  would have limited benefit and could potentially interfere with later options for advanced heart failure therapy including potential need for mechanical assist device implantation.   Current Activity/ Functional Status: The patient has low activity level and is sedentary   Zubrod Score: At the time of surgery this patient's most appropriate activity status/level should be described as: []     0    Normal activity, no symptoms []     1    Restricted in physical strenuous activity but ambulatory, able to do out light work [x]     2    Ambulatory and capable of self care, unable to do work activities, up and about                 more than 50%  Of the time                            []     3    Only limited self care, in bed greater than 50% of waking hours []     4    Completely disabled, no self care, confined to bed or chair []   5    Moribund  Past Medical History:  Diagnosis Date  . Acid reflux   . Back pain    f4 and f5  . CAD (coronary artery disease)   . CHF (congestive heart failure) ([place])    last echo was in March 2016  . Diabetes mellitus 2005  . Diverticulosis   . H/O chest pain 2005   ECHO  . HTN (hypertension)   . Hyperlipidemia   . Nonischemic cardiomyopathy ([place])   . Onychomycosis   . Polysubstance abuse     Past Surgical History:  Procedure Laterality Date  . APPENDECTOMY    . CARDIAC CATHETERIZATION  2005   4 frech cath  . COLONOSCOPY N/A 08/01/2013   Procedure: COLONOSCOPY;  Surgeon: Rogene Houston, MD;  [facility];  Service:  Endoscopy;  Laterality: N/A;  rescheduled to [place] notified pt  . ESOPHAGOGASTRODUODENOSCOPY N/A 04/23/2015   Procedure: ESOPHAGOGASTRODUODENOSCOPY (EGD);  Surgeon: Rogene Houston, MD;  Location: [facility];  Service: Endoscopy;  Laterality: N/A;  1:25  . RIGHT/LEFT HEART CATH AND CORONARY ANGIOGRAPHY N/A 06/03/2016   Procedure: Right/Left Heart Cath and Coronary Angiography;  Surgeon: Belva Crome, MD;  Location: [facility];  Service: Cardiovascular;  Laterality: N/A;    History  Smoking Status  . Never Smoker  Smokeless Tobacco  . Current User  . Types: Snuff    Comment: dips snuff about 6 cans/week for 3 years    History  Alcohol Use  . 0.6 oz/week  . 1 Cans of beer per week    Comment: occ - has cut down, now once every 2-3 months    Social History   Social History  . Marital status: Married    Spouse name: N/A  . Number of children: N/A  . Years of education: N/A   Occupational History  . disabled    Social History Main Topics  . Smoking status: Never Smoker  . Smokeless tobacco: Current User    Types: Snuff     Comment: dips snuff about 6 cans/week for 3 years  . Alcohol use 0.6 oz/week    1 Cans of beer per week     Comment: occ - has cut down, now once every 2-3 months  . Drug use: Yes    Types: Marijuana     Comment: occasional use - smoking less  . Sexual activity: Yes   Other Topics Concern  . Not on file   Social History Narrative  . No narrative on file    Allergies  Allergen Reactions  . Sulfa Antibiotics Shortness Of Breath  . Penicillins Rash    Has patient had a PCN reaction causing immediate rash, facial/tongue/throat swelling, SOB or lightheadedness with hypotension: No Has patient had a PCN reaction causing severe rash involving mucus membranes or skin necrosis: No Has patient had a PCN reaction that required hospitalization No Has patient had a PCN reaction occurring within the last 10 years: No If all of the above answers  are "NO", then may proceed with Cephalosporin use.     Current Facility-Administered Medications  Medication Dose Route Frequency Provider Last Rate Last Dose  . 0.9 %  sodium chloride infusion  250 mL Intravenous PRN Karmen Bongo, MD      . 0.9 %  sodium chloride infusion  250 mL Intravenous PRN Belva Crome, MD      . acetaminophen (TYLENOL) tablet 650 mg  650 mg Oral Q4H PRN  Belva Crome, MD   650 mg at 06/04/16 1321  . albuterol (PROVENTIL) (2.5 MG/3ML) 0.083% nebulizer solution 3 mL  3 mL Inhalation Q6H PRN Karmen Bongo, MD      . aspirin EC tablet 81 mg  81 mg Oral Daily Karmen Bongo, MD   81 mg at 06/06/16 0826  . carvedilol (COREG) tablet 12.5 mg  12.5 mg Oral BID WC Karmen Bongo, MD   12.5 mg at 06/06/16 0826  . enoxaparin (LOVENOX) injection 40 mg  40 mg Subcutaneous Q24H Belva Crome, MD   40 mg at 06/06/16 0827  . fluticasone (FLONASE) 50 MCG/ACT nasal spray 2 spray  2 spray Each Nare Daily Karmen Bongo, MD   2 spray at 06/06/16 [phone]  . [START ON 06/07/2016] furosemide (LASIX) tablet 60 mg  60 mg Oral BID Larey Dresser, MD      . gabapentin (NEURONTIN) capsule 300 mg  300 mg Oral QHS Karmen Bongo, MD   300 mg at 06/05/16 2200  . insulin aspart (novoLOG) injection 0-15 Units  0-15 Units Subcutaneous TID WC Karmen Bongo, MD   2 Units at 06/06/16 1159  . insulin aspart (novoLOG) injection 0-5 Units  0-5 Units Subcutaneous QHS Karmen Bongo, MD   2 Units at 06/05/16 2201  . insulin glargine (LANTUS) injection 40 Units  40 Units Subcutaneous QHS Karmen Bongo, MD   40 Units at 06/05/16 2201  . isosorbide mononitrate (IMDUR) 24 hr tablet 90 mg  90 mg Oral Daily Karmen Bongo, MD   90 mg at 06/06/16 0826  . [START ON 06/07/2016] losartan (COZAAR) tablet 100 mg  100 mg Oral Daily Velvet Bathe, MD      . nicotine (NICODERM CQ - dosed in mg/24 hours) patch 14 mg  14 mg Transdermal Daily Karmen Bongo, MD   14 mg at 06/06/16 0830  . nitroGLYCERIN (NITROSTAT) SL tablet 0.4  mg  0.4 mg Sublingual Q5 min PRN Karmen Bongo, MD      . omega-3 acid ethyl esters (LOVAZA) capsule 1 g  1 g Oral Daily Karmen Bongo, MD   1 g at 06/06/16 0826  . ondansetron (ZOFRAN) injection 4 mg  4 mg Intravenous Q6H PRN Belva Crome, MD      . oxyCODONE-acetaminophen (PERCOCET/ROXICET) 5-325 MG per tablet 1-2 tablet  1-2 tablet Oral Q4H PRN Belva Crome, MD      . pantoprazole (PROTONIX) EC tablet 40 mg  40 mg Oral Daily Karmen Bongo, MD   40 mg at 06/06/16 0827  . rosuvastatin (CRESTOR) tablet 20 mg  20 mg Oral q1800 Karmen Bongo, MD   20 mg at 06/05/16 1645  . sodium chloride flush (NS) 0.9 % injection 3 mL  3 mL Intravenous Q12H Karmen Bongo, MD   3 mL at 06/05/16 1000  . sodium chloride flush (NS) 0.9 % injection 3 mL  3 mL Intravenous PRN Karmen Bongo, MD      . sodium chloride flush (NS) 0.9 % injection 3 mL  3 mL Intravenous Q12H Belva Crome, MD   3 mL at 06/06/16 9480  . sodium chloride flush (NS) 0.9 % injection 3 mL  3 mL Intravenous PRN Belva Crome, MD      . spironolactone (ALDACTONE) tablet 12.5 mg  12.5 mg Oral Daily Larey Dresser, MD   12.5 mg at 06/06/16 1158    Prescriptions Prior to Admission  Medication Sig Dispense Refill Last Dose  . albuterol (PROVENTIL HFA;VENTOLIN HFA)  108 (90 Base) MCG/ACT inhaler Inhale 2 puffs into the lungs every 6 (six) hours as needed for wheezing or shortness of breath. 1 Inhaler 2 Past Week at Unknown time  . amLODipine (NORVASC) 5 MG tablet Take 1 tablet (5 mg total) by mouth daily. (Patient taking differently: Take 10 mg by mouth daily. 1/2 tablet daily) 90 tablet 3 06/01/2016 at Unknown time  . aspirin EC 81 MG tablet Take 81 mg by mouth daily.   Past Week at Unknown time  . Capsaicin (ICY HOT ARTHRITIS THERAPY EX) Apply 1 application topically 2 (two) times daily as needed (back pain).    unknown at Unknown time  . carvedilol (COREG) 12.5 MG tablet TAKE ONE TABLET TWICE A DAY WITH FOOD 180 tablet 0 06/01/2016 at 1300  .  fluticasone (FLONASE) 50 MCG/ACT nasal spray USE 2 SPRAYS IN EACH NOSTRIL DAILY 48 g 0 06/01/2016 at Unknown time  . furosemide (LASIX) 40 MG tablet Take 1.5 tablets (60 mg total) by mouth daily. 45 tablet 6 06/01/2016 at Unknown time  . gabapentin (NEURONTIN) 300 MG capsule Take 1 capsule (300 mg total) by mouth at bedtime. 30 capsule 1 05/31/2016 at Unknown time  . Insulin Glargine (TOUJEO SOLOSTAR) 300 UNIT/ML SOPN Inject 70-90 Units into the skin daily. (Patient taking differently: Inject 70 Units into the skin daily. ) 9 mL 0 05/31/2016 at Unknown time  . insulin lispro (HUMALOG) 100 UNIT/ML injection Inject 3 Units into the skin at bedtime.     . isosorbide mononitrate (IMDUR) 60 MG 24 hr tablet TAKE 1 AND 1/2 TABLETS DAILY 135 tablet 0 06/01/2016 at Unknown time  . lisinopril (PRINIVIL,ZESTRIL) 40 MG tablet TAKE ONE (1) TABLET EACH DAY 90 tablet 0 06/01/2016 at Unknown time  . metFORMIN (GLUCOPHAGE) 500 MG tablet Take 1 tablet (500 mg total) by mouth 2 (two) times daily with a meal. 180 tablet 3 06/01/2016 at Unknown time  . Multiple Vitamins-Minerals (CENTRUM SILVER ADULT 50+ PO) Take 1 tablet by mouth daily.   Past Week at Unknown time  . nitroGLYCERIN (NITROSTAT) 0.4 MG SL tablet DISSOLVE 1 TAB UNDER TOUNGE FOR CHEST PAIN. MAY REPEAT EVERY 5 MINUTES FOR 3 DOSES. IF NO RELIEF CALL 911 OR GO TO ER 25 tablet 2 06/01/2016 at Unknown time  . Omega-3 Fatty Acids (FISH OIL) 1000 MG CPDR Take 1 capsule by mouth daily.   Past Week at Unknown time  . pantoprazole (PROTONIX) 40 MG tablet TAKE ONE (1) TABLET EACH DAY 90 tablet 0 06/01/2016 at Unknown time  . rosuvastatin (CRESTOR) 10 MG tablet TAKE ONE (1) TABLET EACH DAY 90 tablet 0 06/01/2016 at Unknown time  . spironolactone (ALDACTONE) 25 MG tablet TAKE 1/2 TABLET DAILY 45 tablet 0 06/01/2016 at Unknown time  . terbinafine (LAMISIL) 1 % cream Apply 1 application topically 2 (two) times daily. Apply to both feet and between toes 30 g 1 Past Week at Unknown time    . ULTICARE MINI PEN NEEDLES 31G X 6 MM MISC 4 TIMES DAILY 100 each 1 Taking    Family History  Problem Relation Age of Onset  . Hypertension Father   . Diabetes Father   . Cancer Mother   . Alcohol abuse Brother   . Colon cancer Neg Hx      Review of Systems:       Cardiac Review of Systems: Y or N  Chest Pain [  Yes chest pressure  ]  Resting SOB [   ] Exertional SOB  [  yes ]  Orthopnea [  ]   Pedal Edema [  yes ]    Palpitations [  ] Syncope  [  ]   Presyncope [   ]  General Review of Systems: [Y] = yes [  ]=no Constitional: recent weight change [ weight gain ]; anorexia [  ]; fatigue [place]  ]; nausea [  ]; night sweats [  ]; fever [  ]; or chills [  ]                                                               Dental: poor dentition[  ]; Last Dentist visit:   Eye : blurred vision [  ]; diplopia [   ]; vision changes [  ];  Amaurosis fugax[  ]; Resp: cough [  ];  wheezing[  ];  hemoptysis[  ]; shortness of breath[  ]; paroxysmal nocturnal dyspnea[  ]; dyspnea on exertion[  ]; or orthopnea[  ];  GI:  gallstones[  ], vomiting[  ];  dysphagia[  ]; melena[  ];  hematochezia [  ]; heartburn[  ];   Hx of  Colonoscopy[  ]; GU: kidney stones [  ]; hematuria[  ];   dysuria [  ];  nocturia[  ];  history of     obstruction [  ]; urinary frequency [  ]             Skin: rash, swelling[  ];, hair loss[  ];  peripheral edema[  ];  or itching[  ]; Musculosketetal: myalgias[  ];  joint swelling[  ];  joint erythema[  ];  joint pain[  ];  back pain[  ];  Heme/Lymph: bruising[  ];  bleeding[  ];  anemia[  ];  Neuro: TIA[  ];  headaches[  ];  stroke[  ];  vertigo[  ];  seizures[  ];   paresthesias[  ];  difficulty walking[  ];  Psych:depression[  ]; anxiety[  ];  Endocrine: diabetes[ yes ];  thyroid dysfunction[  ];  Immunizations: Flu [  ]; Pneumococcal[  ];  Other: Right-hand dominant, no history of thoracic trauma  Physical Exam: BP (!) 112/59 (BP Location: Left Arm)   Pulse 69   Temp  98.3 F (36.8 C) (Oral)   Resp 16   Ht 5\' 9"  (1.753 m)   Wt 210 lb 6.4 oz (95.4 kg)   SpO2 94%   BMI 31.07 kg/m       Physical Exam  General: Middle-aged AA male , overweight no acute distress  HEENT: Normocephalic pupils equal , dentition adequate Neck: Supple with moderate  JVD,no  adenopathy, no bruit Chest: Bi basilar crackles with, symmetrical breath sounds, no rhonchi, no tenderness             or deformity Cardiovascular: Regular rate and rhythm, no murmur, no gallop, peripheral pulses             palpable in all extremities Abdomen:  Soft, nontender, no palpable mass or organomegaly Extremities: Warm, well-perfused, no clubbing cyanosis  or tenderness, 1-2 plus pedal edema bilaterally              mild  venous stasis changes of the legs Rectal/GU: Deferred Neuro: Grossly non--focal and symmetrical throughout Skin:  Clean and dry without rash or ulceration    Diagnostic Studies & Laboratory data:     Recent Radiology Findings:   No results found.   I have independently reviewed the above radiologic studies.  Recent Lab Findings: Lab Results  Component Value Date   WBC 5.0 06/04/2016   HGB 9.5 (L) 06/04/2016   HCT 30.0 (L) 06/04/2016   PLT 216 06/04/2016   GLUCOSE 125 (H) 06/06/2016   CHOL 215 (H) 05/30/2016   TRIG 82 05/30/2016   HDL 66 05/30/2016   LDLCALC 133 (H) 05/30/2016   ALT 29 05/30/2016   AST 31 05/30/2016   NA 139 06/06/2016   K 4.2 06/06/2016   CL 101 06/06/2016   CREATININE 1.51 (H) 06/06/2016   BUN 30 (H) 06/06/2016   CO2 30 06/06/2016   TSH 3.282 06/01/2016   INR 0.96 06/03/2016   HGBA1C 10.1 (H) 06/01/2016      Assessment / Plan:     Second admission for advanced heart failure within the past 4 months from underlying nonischemic cardiomyopathy, poorly controlled diabetes,     Progressive decline in LVEF since December 2017 now 25%  Progression in CAD from last cath > 10 years ago. I would expect the patient have little  improvement in LV function with CABG to his LAD as he has chronic nonischemic cardiomyopathy LV diameter of greater than 6 cm. If the patient does appear to have persistent angina PCI would be the best approach. With compliance to advanced heart failure medical therapies and follow-up he should do fairly well with currently compensated nonischemic cardio myopathy.  The patient very well could need mechanical cardiac assist in the future for advanced heart failure and a sternotomy now with little expected benefit would only make things more complicated for him later. . Will follow.   @ME1 @ 06/06/2016 2:19 PM

## 2016-06-06 NOTE — Progress Notes (Signed)
PROGRESS NOTE    Brad Mendoza  KZS:[id number] DOB: 1955/10/28 DOA: 06/01/2016 PCP: [name], MD    Brief Narrative:  Brad Mendoza is a 61 y.o. male with medical history significant of nonischemic cardiomyopathy with CHF, EF 35-40% in 12/17; HLD; HTN; DM; and CAD (LAD with 50% stenosis on remote cath) presenting with "My fluid been on my chest.  Like it was last time."  Called the doctor and went in for an appointment and they sent him here.  Assessment & Plan:   Principal Problem:   Unstable angina [facility]) - Cardiology consulted. Pt is s/p Heart cath. Cardiology does not think patient is good PCI candidate and plans currently are for medical management with surgical evaluation of CABG after diuresis  Active Problems:   Hyperlipemia - Continue Lovaza and Crestor    Tobacco dependence - Continue nicotine patch recommend cessation on discharge    Essential hypertension - lisinopril, imdur, carvedilol    Type 2 diabetes mellitus with complications (HCC) - Continue Lantus - Carb modified diet    CHF exacerbation ([place]) - Cardiology assisting - diuresis per cards. Plan is for continued diuresis as patient's dry weight is 198-200 lbs - monitor BMP while diuresing  DVT prophylaxis:  Lovenox Code Status: Full Family Communication: None at bedside Disposition Plan: Pending final recommendations from cardiologist   Consultants:   Cardiology   Procedures: none   Antimicrobials: none   Subjective: Pt reports no new complaints. Awaiting further recommendations from cardiologist  Objective: Vitals:   06/05/16 1635 06/05/16 2004 06/06/16 0433 06/06/16 0826  BP: 136/67 118/80 131/78 118/73  Pulse: 75 76 75 72  Resp: 17 16 19    Temp:  98.4 F (36.9 C) 98.4 F (36.9 C)   TempSrc:   Oral   SpO2: 97% 98% 90%   Weight:   95.4 kg (210 lb 6.4 oz)   Height:        Intake/Output Summary (Last 24 hours) at 06/06/16 1152 Last data filed at 06/06/16 1145  Gross  per 24 hour  Intake             1320 ml  Output             4200 ml  Net            -2880 ml   Filed Weights   06/04/16 0500 06/05/16 0442 06/06/16 0433  Weight: 97.1 kg (214 lb) 94.4 kg (208 lb 3.2 oz) 95.4 kg (210 lb 6.4 oz)    Examination:  General exam: Appears calm and comfortable, in nad. Respiratory system: Clear to auscultation. Respiratory effort normal. Cardiovascular system: S1 & S2 heard, RRR. No JVD, murmurs, rubs, Gastrointestinal system: Abdomen is nondistended, soft and nontender. No organomegaly or masses felt. Normal bowel sounds heard. Central nervous system: Alert and oriented. No focal neurological deficits. Extremities: Symmetric 5 x 5 power. Skin: No rashes, lesions or ulcers, on limited exam. Psychiatry: Judgement and insight appear normal.   Data Reviewed: I have personally reviewed following labs and imaging studies  CBC:  Recent Labs Lab 06/01/16 1628 06/01/16 2123 06/03/16 1536 06/04/16 0407  WBC 5.8 6.4 5.0 5.0  NEUTROABS  --  3.8  --   --   HGB 11.0* 10.5* 10.7* 9.5*  HCT 33.3* 32.9* 33.4* 30.0*  MCV 82.0 81.2 81.9 81.3  PLT 236 229 254 732   Basic Metabolic Panel:  Recent Labs Lab 06/01/16 1628 06/02/16 0321 06/03/16 2025 06/03/16 1536 06/04/16 0407 06/06/16 4270  NA 140 139 138  --  140 139  K 4.5 4.0 3.9  --  4.0 4.2  CL 107 108 105  --  103 101  CO2 28 25 27   --  27 30  GLUCOSE 181* 128* 175*  --  170* 125*  BUN 27* 28* 30*  --  25* 30*  CREATININE 1.57* 1.56* 1.54* 1.55* 1.37* 1.51*  CALCIUM 8.3* 8.0* 8.0*  --  8.4* 8.2*   GFR: Estimated Creatinine Clearance: 59.3 mL/min (A) (by C-G formula based on SCr of 1.51 mg/dL (H)). Liver Function Tests: No results for input(s): AST, ALT, ALKPHOS, BILITOT, PROT, ALBUMIN in the last 168 hours. No results for input(s): LIPASE, AMYLASE in the last 168 hours. No results for input(s): AMMONIA in the last 168 hours. Coagulation Profile:  Recent Labs Lab 06/03/16 0438  INR 0.96    Cardiac Enzymes:  Recent Labs Lab 06/01/16 1628 06/01/16 2123 06/02/16 0321 06/02/16 1123  TROPONINI <0.03 <0.03 <0.03 <0.03   BNP (last 3 results) No results for input(s): PROBNP in the last 8760 hours. HbA1C: No results for input(s): HGBA1C in the last 72 hours. CBG:  Recent Labs Lab 06/05/16 1045 06/05/16 1623 06/05/16 2126 06/06/16 0719 06/06/16 1142  GLUCAP 129* 248* 231* 104* 144*   Lipid Profile: No results for input(s): CHOL, HDL, LDLCALC, TRIG, CHOLHDL, LDLDIRECT in the last 72 hours. Thyroid Function Tests: No results for input(s): TSH, T4TOTAL, FREET4, T3FREE, THYROIDAB in the last 72 hours. Anemia Panel: No results for input(s): VITAMINB12, FOLATE, FERRITIN, TIBC, IRON, RETICCTPCT in the last 72 hours. Sepsis Labs: No results for input(s): PROCALCITON, LATICACIDVEN in the last 168 hours.  No results found for this or any previous visit (from the past 240 hour(s)).   Radiology Studies: No results found.  Scheduled Meds: . aspirin EC  81 mg Oral Daily  . carvedilol  12.5 mg Oral BID WC  . enoxaparin (LOVENOX) injection  40 mg Subcutaneous Q24H  . fluticasone  2 spray Each Nare Daily  . [START ON 06/07/2016] furosemide  60 mg Oral BID  . gabapentin  300 mg Oral QHS  . insulin aspart  0-15 Units Subcutaneous TID WC  . insulin aspart  0-5 Units Subcutaneous QHS  . insulin glargine  40 Units Subcutaneous QHS  . isosorbide mononitrate  90 mg Oral Daily  . [START ON 06/07/2016] losartan  100 mg Oral Daily  . nicotine  14 mg Transdermal Daily  . omega-3 acid ethyl esters  1 g Oral Daily  . pantoprazole  40 mg Oral Daily  . rosuvastatin  20 mg Oral q1800  . sodium chloride flush  3 mL Intravenous Q12H  . sodium chloride flush  3 mL Intravenous Q12H  . spironolactone  12.5 mg Oral Daily   Continuous Infusions:   LOS: 5 days    Time spent: > 35 minutes  Velvet Bathe, MD [facility] Pager [phone]  If 7PM-7AM, please contact  night-coverage [email or web address] Password [facility] 06/06/2016, 11:52 AM

## 2016-06-06 NOTE — Consult Note (Signed)
Advanced Heart Failure Team Consult Note   Primary Physician: Dr. Warrick Parisian Primary Cardiologist:  Dr. Bronson Ing   Reason for Consultation: Acute on chronic systolic CHF  HPI:    Brad Mendoza is seen today for evaluation of acute systolic CHF at the request of Dr. Sallyanne Kuster.   Brad Mendoza is a 61 year old male with a past medical history of chronic combined systolic and diastolic CHF last EF 18-29% (Dec. 2017), HTN, HLD, DM and CAD. Also with history of NICM, left heart cath in June 2005 with 40-50% mid LAD stenosis, EF 45% at that time.   Admitted on 06/01/16 with chest pain relieved with nitroglycerin and volume overload. Troponin negative. Admission weight was 230 pounds. Right and left heart cath on 06/03/16 with 70-75% stenosis in the proximal and mid LAD,90% ostial stenosis in the 1st diagonal, and  60-70% stenosis in the mid RCA. RHC showed Fick C.O. 5.86/index 2.74. PA mean 33 mm Hg, LVEDP 25 mm Hg. EF by cath appears to be <25%. Medical therapy was recommended in the absence of ACS, and PCI was felt to be high risk with severe cardiomyopathy. Plan is for surgical consultation.   Weight down 20 pounds this admission, diuresing with IV Lasix 60mg  BID. Echo pending.   [name] starting gaining weight around the first of this month after eating high salt foods at [place]. He had stopped weighing himself daily and noticed increasing orthopnea. Also endorses PND. He is compliant with his medications at home, says that his weight at home is usually 198 pounds, last weight at Dr. Court Joy office in Dec. 2017 was 207 pounds. He denies ETOH use, does chew tobacco daily.    Review of Systems: [y] = yes, [ ]  = no   General: Weight gain Blue.Reese ]; Weight loss [ ] ; Anorexia [ ] ; Fatigue [ y]; Fever [ ] ; Chills [ ] ; Weakness [ ]   Cardiac: Chest pain/pressure Blue.Reese ]; Resting SOB [ ] ; Exertional SOB Blue.Reese ]; Orthopnea [ ] ; Pedal Edema [ ] ; Palpitations [ ] ; Syncope [ ] ; Presyncope [ ] ; Paroxysmal  nocturnal dyspnea[y ]  Pulmonary: Cough [ ] ; Wheezing[ ] ; Hemoptysis[ ] ; Sputum [ ] ; Snoring [ ]   GI: Vomiting[ ] ; Dysphagia[ ] ; Melena[ ] ; Hematochezia [ ] ; Heartburn[ ] ; Abdominal pain [ ] ; Constipation [ ] ; Diarrhea [ ] ; BRBPR [ ]   GU: Hematuria[ ] ; Dysuria [ ] ; Nocturia[ ]   Vascular: Pain in legs with walking [ ] ; Pain in feet with lying flat [ ] ; Non-healing sores [ ] ; Stroke [ ] ; TIA [ ] ; Slurred speech [ ] ;  Neuro: Headaches[ ] ; Vertigo[ ] ; Seizures[ ] ; Paresthesias[ ] ;Blurred vision [ ] ; Diplopia [ ] ; Vision changes [ ]   Ortho/Skin: Arthritis [ y]; Joint pain [ ] ; Muscle pain [ ] ; Joint swelling [ ] ; Back Pain [ ] ; Rash [ ]   Psych: Depression[ ] ; Anxiety[ ]   Heme: Bleeding problems [ ] ; Clotting disorders [ ] ; Anemia [ ]   Endocrine: Diabetes [ ] ; Thyroid dysfunction[ ]   Home Medications Prior to Admission medications   Medication Sig Start Date End Date Taking? Authorizing Provider  albuterol (PROVENTIL HFA;VENTOLIN HFA) 108 (90 Base) MCG/ACT inhaler Inhale 2 puffs into the lungs every 6 (six) hours as needed for wheezing or shortness of breath. 05/21/15  Yes [name], MD  amLODipine (NORVASC) 5 MG tablet Take 1 tablet (5 mg total) by mouth daily. Patient taking differently: Take 10 mg by mouth daily. 1/2 tablet daily 02/04/16 06/01/16 Yes Jamesetta So  Blanchard Mane, MD  aspirin EC 81 MG tablet Take 81 mg by mouth daily.   Yes Historical Provider, MD  Capsaicin (ICY HOT ARTHRITIS THERAPY EX) Apply 1 application topically 2 (two) times daily as needed (back pain).    Yes Historical Provider, MD  carvedilol (COREG) 12.5 MG tablet TAKE ONE TABLET TWICE A DAY WITH FOOD 05/24/16  Yes Chipper Herb, MD  fluticasone Lifescape) 50 MCG/ACT nasal spray USE 2 SPRAYS IN [facility] NOSTRIL DAILY 05/24/16  Yes Chipper Herb, MD  furosemide (LASIX) 40 MG tablet Take 1.5 tablets (60 mg total) by mouth daily. 10/30/15 06/01/16 Yes Lendon Colonel, NP  gabapentin (NEURONTIN) 300 MG capsule Take 1 capsule (300 mg  total) by mouth at bedtime. 03/28/16  Yes [name], MD  Insulin Glargine (TOUJEO SOLOSTAR) 300 UNIT/ML SOPN Inject 70-90 Units into the skin daily. Patient taking differently: Inject 70 Units into the skin daily.  05/24/16  Yes Fransisca Kaufmann Dettinger, MD  insulin lispro (HUMALOG) 100 UNIT/ML injection Inject 3 Units into the skin at bedtime.   Yes Historical Provider, MD  isosorbide mononitrate (IMDUR) 60 MG 24 hr tablet TAKE 1 AND 1/2 TABLETS DAILY 05/24/16  Yes Chipper Herb, MD  lisinopril (PRINIVIL,ZESTRIL) 40 MG tablet TAKE ONE (1) TABLET EACH DAY 05/24/16  Yes Chipper Herb, MD  metFORMIN (GLUCOPHAGE) 500 MG tablet Take 1 tablet (500 mg total) by mouth 2 (two) times daily with a meal. 05/30/16  Yes Fransisca Kaufmann Dettinger, MD  Multiple Vitamins-Minerals (CENTRUM SILVER ADULT 50+ PO) Take 1 tablet by mouth daily.   Yes Historical Provider, MD  nitroGLYCERIN (NITROSTAT) 0.4 MG SL tablet DISSOLVE 1 TAB UNDER TOUNGE FOR CHEST PAIN. MAY REPEAT EVERY 5 MINUTES FOR 3 DOSES. IF NO RELIEF CALL 911 OR GO TO ER 12/10/15  Yes [name], MD  Omega-3 Fatty Acids (FISH OIL) 1000 MG CPDR Take 1 capsule by mouth daily.   Yes Historical Provider, MD  pantoprazole (PROTONIX) 40 MG tablet TAKE ONE (1) TABLET EACH DAY 05/24/16  Yes Chipper Herb, MD  rosuvastatin (CRESTOR) 10 MG tablet TAKE ONE (1) TABLET EACH DAY 05/24/16  Yes Chipper Herb, MD  spironolactone (ALDACTONE) 25 MG tablet TAKE 1/2 TABLET DAILY 05/24/16  Yes Chipper Herb, MD  terbinafine (LAMISIL) 1 % cream Apply 1 application topically 2 (two) times daily. Apply to both feet and between toes 03/28/16  Yes Cherre Robins, PharmD  ULTICARE MINI PEN NEEDLES 31G X 6 MM MISC 4 TIMES DAILY 05/24/16   Chipper Herb, MD    Past Medical History: Past Medical History:  Diagnosis Date  . Acid reflux   . Back pain    f4 and f5  . CAD (coronary artery disease)   . CHF (congestive heart failure) ([place])    last echo was in March 2016  . Diabetes mellitus 2005  .  Diverticulosis   . H/O chest pain 2005   ECHO  . HTN (hypertension)   . Hyperlipidemia   . Nonischemic cardiomyopathy ([place])   . Onychomycosis   . Polysubstance abuse     Past Surgical History: Past Surgical History:  Procedure Laterality Date  . APPENDECTOMY    . CARDIAC CATHETERIZATION  2005   4 frech cath  . COLONOSCOPY N/A 08/01/2013   Procedure: COLONOSCOPY;  Surgeon: Rogene Houston, MD;  [facility];  Service: Endoscopy;  Laterality: N/A;  rescheduled to [place] notified pt  . ESOPHAGOGASTRODUODENOSCOPY N/A 04/23/2015  Procedure: ESOPHAGOGASTRODUODENOSCOPY (EGD);  Surgeon: Rogene Houston, MD;  Location: [facility];  Service: Endoscopy;  Laterality: N/A;  1:25  . RIGHT/LEFT HEART CATH AND CORONARY ANGIOGRAPHY N/A 06/03/2016   Procedure: Right/Left Heart Cath and Coronary Angiography;  Surgeon: Belva Crome, MD;  Location: [place] [facility];  Service: Cardiovascular;  Laterality: N/A;    Family History: Family History  Problem Relation Age of Onset  . Hypertension Father   . Diabetes Father   . Cancer Mother   . Alcohol abuse Brother   . Colon cancer Neg Hx     Social History: Social History   Social History  . Marital status: Married    Spouse name: N/A  . Number of children: N/A  . Years of education: N/A   Occupational History  . disabled    Social History Main Topics  . Smoking status: Never Smoker  . Smokeless tobacco: Current User    Types: Snuff     Comment: dips snuff about 6 cans/week for 3 years  . Alcohol use 0.6 oz/week    1 Cans of beer per week     Comment: occ - has cut down, now once every 2-3 months  . Drug use: Yes    Types: Marijuana     Comment: occasional use - smoking less  . Sexual activity: Yes   Other Topics Concern  . None   Social History Narrative  . None    Allergies:  Allergies  Allergen Reactions  . Sulfa Antibiotics Shortness Of Breath  . Penicillins Rash    Has patient had a PCN reaction  causing immediate rash, facial/tongue/throat swelling, SOB or lightheadedness with hypotension: No Has patient had a PCN reaction causing severe rash involving mucus membranes or skin necrosis: No Has patient had a PCN reaction that required hospitalization No Has patient had a PCN reaction occurring within the last 10 years: No If all of the above answers are "NO", then may proceed with Cephalosporin use.     Objective:    Vital Signs:   Temp:  [98.2 F (36.8 C)-98.4 F (36.9 C)] 98.4 F (36.9 C) (04/16 0433) Pulse Rate:  [61-76] 75 (04/16 0433) Resp:  [16-22] 19 (04/16 0433) BP: (118-136)/(67-91) 131/78 (04/16 0433) SpO2:  [90 %-99 %] 90 % (04/16 0433) Weight:  [210 lb 6.4 oz (95.4 kg)] 210 lb 6.4 oz (95.4 kg) (04/16 0433) Last BM Date: 06/05/16  Weight change: Filed Weights   06/04/16 0500 06/05/16 0442 06/06/16 0433  Weight: 214 lb (97.1 kg) 208 lb 3.2 oz (94.4 kg) 210 lb 6.4 oz (95.4 kg)    Intake/Output:   Intake/Output Summary (Last 24 hours) at 06/06/16 0805 Last data filed at 06/06/16 0400  Gross per 24 hour  Intake             1440 ml  Output             3000 ml  Net            -1560 ml     Physical Exam: General: Well appearing male in NAD.  HEENT: normal Neck: supple. JVP 7-8 cm. Carotids 2+ bilat; no bruits. No lymphadenopathy or thyromegaly appreciated. Cor: PMI nondisplaced. Regular rate & rhythm. No rubs, gallops or murmurs. Lungs: clear bilaterally in upper lobes. Fine crackles in bases bilaterally.  Abdomen: soft, nontender, nondistended. No hepatosplenomegaly. No bruits or masses. Good bowel sounds. Extremities: no cyanosis, clubbing, rash, 1+ pedal and pretibial edema.  Neuro: alert &  orientedx3, cranial nerves grossly intact. moves all 4 extremities w/o difficulty. Affect pleasant  Telemetry: NSR.   Labs: Basic Metabolic Panel:  Recent Labs Lab 06/01/16 1628 06/02/16 0321 06/03/16 0332 06/03/16 1536 06/04/16 0407 06/06/16 0323  NA 140  139 138  --  140 139  K 4.5 4.0 3.9  --  4.0 4.2  CL 107 108 105  --  103 101  CO2 28 25 27   --  27 30  GLUCOSE 181* 128* 175*  --  170* 125*  BUN 27* 28* 30*  --  25* 30*  CREATININE 1.57* 1.56* 1.54* 1.55* 1.37* 1.51*  CALCIUM 8.3* 8.0* 8.0*  --  8.4* 8.2*    Liver Function Tests:  Recent Labs Lab 05/30/16 1059  AST 31  ALT 29  ALKPHOS 86  BILITOT 0.2  PROT 5.1*  ALBUMIN 2.9*    CBC:  Recent Labs Lab 05/30/16 1059 06/01/16 1628 06/01/16 2123 06/03/16 1536 06/04/16 0407  WBC 7.3 5.8 6.4 5.0 5.0  NEUTROABS 4.3  --  3.8  --   --   HGB  --  11.0* 10.5* 10.7* 9.5*  HCT 36.9* 33.3* 32.9* 33.4* 30.0*  MCV 82 82.0 81.2 81.9 81.3  PLT 287 236 229 254 216    Cardiac Enzymes:  Recent Labs Lab 06/01/16 1628 06/01/16 2123 06/02/16 0321 06/02/16 1123  TROPONINI <0.03 <0.03 <0.03 <0.03    BNP: BNP (last 3 results)  Recent Labs  11/16/15 1150 06/01/16 1628 06/06/16 0323  BNP 229.0* 240.0* 137.1*     CBG:  Recent Labs Lab 06/05/16 0725 06/05/16 1045 06/05/16 1623 06/05/16 2126 06/06/16 0719  GLUCAP 90 129* 248* 231* 104*    Other results: EKG: NSR, lateral T wave depression   Right/Left Heart Cath and Coronary Angiography 06/03/16  Moderately severe proximal and mid LAD stenoses which are eccentric and in the 70-75% range depending upon review. Greater than 90% ostial stenosis in the first diagonal (large in distribution). The body of the second diagonal (small in distribution) contains 70% stenosis.  60-70% mid RCA stenosis.  No significant obstruction in the circumflex coronary artery.  Severe dilatation and global dysfunction of the left ventricle with EF less than 25%. Mildly elevated filling pressures.  RECOMMENDATIONS:   Guideline mandated medical therapy for heart failure.  Aggressive risk factor modification for coronary disease.  In absence of unstable angina/acute coronary syndrome, best approach for coronary disease is  medical therapy.  If unstable angina/preinfarction syndrome, consider coronary PCI of LAD and or right coronary lesions. Diagonal lesion would be difficult to treat independent of LAD therapy. PCI would likely require hemodynamic support due to severe LV dysfunction.  RHC  Fick Cardiac Output 5.86 L/min  Fick Cardiac Output Index 2.74 (L/min)/BSA  RA A Wave 11 mmHg  RA V Wave 11 mmHg  RA Mean 8 mmHg  RV Systolic Pressure 50 mmHg  RV Diastolic Pressure 7 mmHg  RV EDP 11 mmHg  PA Systolic Pressure 51 mmHg  PA Diastolic Pressure 23 mmHg  PA Mean 33 mmHg  PW A Wave 20 mmHg  PW V Wave 22 mmHg  PW Mean 19 mmHg  AO Systolic Pressure 003 mmHg  AO Diastolic Pressure 64 mmHg  AO Mean 81 mmHg  LV Systolic Pressure 97 mmHg  LV Diastolic Pressure 13 mmHg  LV EDP 25 mmHg  Arterial Occlusion Pressure Extended Systolic Pressure 491 mmHg  Arterial Occlusion Pressure Extended Diastolic Pressure 62 mmHg  Arterial Occlusion Pressure Extended Mean Pressure 78 mmHg  Left Ventricular Apex Extended Systolic Pressure 98 mmHg  Left Ventricular Apex Extended Diastolic Pressure 14 mmHg  Left Ventricular Apex Extended EDP Pressure 25 mmHg  QP/QS 1  TPVR Index 12.05 HRUI  TSVR Index 29.56 HRUI  PVR SVR Ratio 0.15  TPVR/TSVR Ratio 0.41     Diagnostic Diagram          Medications:     Current Medications: . aspirin EC  81 mg Oral Daily  . carvedilol  12.5 mg Oral BID WC  . enoxaparin (LOVENOX) injection  40 mg Subcutaneous Q24H  . fluticasone  2 spray Each Nare Daily  . furosemide  60 mg Intravenous BID  . gabapentin  300 mg Oral QHS  . insulin aspart  0-15 Units Subcutaneous TID WC  . insulin aspart  0-5 Units Subcutaneous QHS  . insulin glargine  40 Units Subcutaneous QHS  . isosorbide mononitrate  90 mg Oral Daily  . lisinopril  40 mg Oral Daily  . nicotine  14 mg Transdermal Daily  . omega-3 acid ethyl esters  1 g Oral Daily  . pantoprazole  40 mg Oral Daily  . rosuvastatin  20  mg Oral q1800  . sodium chloride flush  3 mL Intravenous Q12H  . sodium chloride flush  3 mL Intravenous Q12H       Assessment/Plan   1. Acute on chronic combined systolic and diastolic CHF: has a history of NICM, last EF 35-40% in Dec. 2017, EF by cath appears to be <25%. LVEDP 41mm Hg, Fick C.O. 5.86/index 2.74.  - Volume only slightly elevated on exam. Got 60mg  IV Lasix today. Stop IV Lasix and start 60mg  lasix po BID tomorrow.  - Continue Coreg 12.5mg  BID.  - Discontinue lisinopril and start losartan 100 mg daily tomorrow. - Continue isosorbide 90mg  daily.  - Start Arlyce Harman 12.5mg  daily.  2. CAD, Unstable angina  - cath report above.  - TCTS consultation called by Dr. Sallyanne Kuster - Continues to have chest heaviness at rest and with walking.  - Continue ASA, Statin and Lovaza.  - Continue isosorbide.  3. Acute renal insufficieny - in the setting of volume overload. Review of past labs shows normal creatinine - Follow with daily BMET.  4. DM:   - A1c this admission 10.1.  - On sliding scale.  5. HTN:  - BP well controlled on current regimen.  6. Tobacco abuse - Long time chewing tobacco use - Encouraged cessation.   Length of Stay: [place], NP  06/06/2016, 8:05 AM  Advanced Heart Failure Team Pager [phone] (M-F; 7a - 4p)  Please contact [facility] for night-coverage after hours (4p -7a ) and weekends on [email or web address]  Patient seen with NP, agree with the above note.  1. Acute on chronic systolic CHF: Last echo with EF 35-40% in 12/17, EF looked worse on LV-gram this admission.  Has had suspected nonischemic cardiomyopathy, cath in 2005 without significant CAD. He was admitted with both exertional dyspnea and exertional chest pain.  He was volume overloaded initially and has been diuresed.  Today, volume status looks ok.  Creatinine up to 1.5.  Weight is down, not far from weight at his last office visit with Dr. Jacinta Shoe.   - He got a dose of IV Lasix today, stop  IV Lasix and start Lasix 60 mg po bid tomorrow (was on 60 mg po daily prior to admission).  - Continue Coreg.  - Add spironolactone 12.5 daily.  - He got lisinopril  today, will stop and put him on losartan 100 mg daily tomorrow.  He can start Entresto 24/26 bid instead of losartan on Thursday morning.  - Awaiting today's echo.  2. CAD: He was admitted with exertional chest pain as well as dyspnea. No ACS.  I reviewed his cath films today with Dr. Gwenlyn Found.  He has moderate disease, 50-60% range in the RCA and the proximal LAD though the area of disease is fairly long in the LAD.  Most significant lesion is 95+% stenosis in ostial D1, relatively small caliber vessel not amenable to PCI, this may be source of angina.  TCTS has already been consulted, but I would favor medical management here. No PCI option, but I do not think putting him through CABG at this point is going to appreciably help his LV systolic function, suspect low EF cannot be explained primarily by coronary disease.  - Continue ASA 81.  - Continue Coreg and Imdur.  - Will add ranolazine 500 mg bid for angina, check ECG in am.  3. AKI: Mild, with diuresis. Transition to po today.  4. DM: Needs better control long-term.   Loralie Champagne 06/06/2016 11:52 AM

## 2016-06-06 NOTE — Care Management Important Message (Signed)
Important Message  Patient Details  Name: Brad Mendoza MRN: [id number] Date of Birth: 05-Oct-1955   Medicare Important Message Given:  Yes    Nathen May 06/06/2016, 2:37 PM

## 2016-06-07 ENCOUNTER — Inpatient Hospital Stay (HOSPITAL_COMMUNITY): Payer: Medicare Other

## 2016-06-07 DIAGNOSIS — I509 Heart failure, unspecified: Secondary | ICD-10-CM

## 2016-06-07 LAB — BASIC METABOLIC PANEL
Anion gap: 7 (ref 5–15)
Anion gap: 5 (ref 5–15)
BUN: 34 mg/dL — ABNORMAL HIGH (ref 6–20)
BUN: 29 mg/dL — AB (ref 6–20)
CALCIUM: 8.5 mg/dL — AB (ref 8.9–10.3)
CALCIUM: 8.4 mg/dL — AB (ref 8.9–10.3)
CHLORIDE: 106 mmol/L (ref 101–111)
CO2: 25 mmol/L (ref 22–32)
CO2: 27 mmol/L (ref 22–32)
CREATININE: 1.3 mg/dL — AB (ref 0.61–1.24)
Chloride: 104 mmol/L (ref 101–111)
Creatinine, Ser: 1.4 mg/dL — ABNORMAL HIGH (ref 0.61–1.24)
GFR calc Af Amer: >60 mL/min (ref >60)
GFR calc non Af Amer: 58 mL/min — ABNORMAL LOW (ref >60)
GFR, EST NON AFRICAN AMERICAN: 53 mL/min — AB (ref >60)
Glucose, Bld: 188 mg/dL — ABNORMAL HIGH (ref 65–99)
Glucose, Bld: 97 mg/dL (ref 65–99)
Potassium: 4.8 mmol/L (ref 3.5–5.1)
Potassium: 4.6 mmol/L (ref 3.5–5.1)
SODIUM: 136 mmol/L (ref 135–145)
Sodium: 138 mmol/L (ref 135–145)

## 2016-06-07 LAB — GLUCOSE, CAPILLARY
GLUCOSE-CAPILLARY: 145 mg/dL — AB (ref 65–99)
GLUCOSE-CAPILLARY: 96 mg/dL (ref 65–99)
GLUCOSE-CAPILLARY: 180 mg/dL — AB (ref 65–99)
Glucose-Capillary: 86 mg/dL (ref 65–99)

## 2016-06-07 LAB — CBC
HCT: 32.8 % — ABNORMAL LOW (ref 39.0–52.0)
Hemoglobin: 10.8 g/dL — ABNORMAL LOW (ref 13.0–17.0)
MCH: 26.9 pg (ref 26.0–34.0)
MCHC: 32.9 g/dL (ref 30.0–36.0)
MCV: 81.6 fL (ref 78.0–100.0)
PLATELETS: 238 10*3/uL (ref 150–400)
RBC: 4.02 MIL/uL — AB (ref 4.22–5.81)
RDW: 13.7 % (ref 11.5–15.5)
WBC: 5.1 10*3/uL (ref 4.0–10.5)

## 2016-06-07 LAB — ECHOCARDIOGRAM COMPLETE
Height: 69 in
Weight: 3318.4 oz

## 2016-06-07 MED ORDER — RANOLAZINE ER 500 MG PO TB12
500.0000 mg | ORAL_TABLET | Freq: Two times a day (BID) | ORAL | Status: DC
  Administered 2016-06-07 – 2016-06-08 (×3): 500 mg via ORAL
  Filled 2016-06-07 (×3): qty 1

## 2016-06-07 NOTE — Progress Notes (Signed)
  Echocardiogram 2D Echocardiogram has been performed.  Brad Mendoza 06/07/2016, 2:25 PM

## 2016-06-07 NOTE — Progress Notes (Signed)
PROGRESS NOTE    Brad Mendoza  YBW:[id number] DOB: [birth year] DOA: 06/01/2016 PCP: [name], MD    Brief Narrative:  Brad Mendoza is a 61 y.o. male with medical history significant of nonischemic cardiomyopathy with CHF, EF 35-40% in 12/17; HLD; HTN; DM; and CAD (LAD with 50% stenosis on remote cath) presenting with "My fluid been on my chest.  Like it was last time."  Called the doctor and went in for an appointment and they sent him here.  Since his initial hospital stay patient has been evaluated several times by cardiology. With concerns listed as to which treatment plan to pursue. Initially there was concern of whether patient had enough coronary artery disease to warrant CABG. But after further evaluation the most recent cardiology note reports that they will manage medically.   Assessment & Plan:   Principal Problem:   Unstable angina [facility]) - Cardiology consulted. Pt is s/p Heart cath. Cardiology does not think patient is good PCI candidate and plans are currently to manage medically as stated not think patient has enough heart disease to warrant a CABG. Please refer to cardiology's note (assessment and plan) on 06/07/2016 for details. - Telemetry reporting 4 runs of V tach, Cardiology on board and patient is on B blocker.  Active Problems:   Hyperlipemia - Continue Lovaza and Crestor    Tobacco dependence - Continue nicotine patch recommend cessation on discharge    Essential hypertension - lisinopril, imdur, carvedilol    Type 2 diabetes mellitus with complications (HCC) - Continue Lantus - Carb modified diet    CHF exacerbation [facility]) - Cardiology managing. Patient is currently on Lasix 60 mg by mouth twice a day which is a change from what he was taking at home. - Patient is currently on Imdur spironolactone Cozaar, Lasix, and carvedilol - monitor BMP while diuresing  DVT prophylaxis:  Lovenox Code Status: Full Family Communication: d/c patient  directly Disposition Plan: Pending final recommendations from cardiologist   Consultants:   Cardiology   Procedures: none   Antimicrobials: none   Subjective: The patient has no new complaints. No acute issues overnight  Objective: Vitals:   06/06/16 1624 06/06/16 2007 06/07/16 0438 06/07/16 0852  BP: 136/88 (!) 112/97 129/74 121/80  Pulse: 71 71 68 70  Resp:  17 16   Temp:  98.7 F (37.1 C) 98.5 F (36.9 C)   TempSrc:  Oral Oral   SpO2:  98% 98%   Weight:   94.1 kg (207 lb 6.4 oz)   Height:        Intake/Output Summary (Last 24 hours) at 06/07/16 1329 Last data filed at 06/07/16 1324  Gross per 24 hour  Intake             1250 ml  Output             2230 ml  Net             -980 ml   Filed Weights   06/05/16 0442 06/06/16 0433 06/07/16 0438  Weight: 94.4 kg (208 lb 3.2 oz) 95.4 kg (210 lb 6.4 oz) 94.1 kg (207 lb 6.4 oz)    Examination:  General exam: Appears calm and comfortable, in nad. Respiratory system: Clear to auscultation. Respiratory effort normal. Cardiovascular system: S1 & S2 heard, RRR. No JVD, murmurs, rubs, Telemetry: reports 4 beats of Vtach. Otherwise sinus rhythm Gastrointestinal system: Abdomen is nondistended, soft and nontender. No organomegaly or masses felt. Normal bowel sounds heard.  Central nervous system: Alert and oriented. No focal neurological deficits. Extremities: Symmetric 5 x 5 power. Skin: No rashes, lesions or ulcers, on limited exam. Psychiatry: Judgement and insight appear normal.   Data Reviewed: I have personally reviewed following labs and imaging studies  CBC:  Recent Labs Lab 06/01/16 1628 06/01/16 2123 06/03/16 1536 06/04/16 0407 06/07/16 0012  WBC 5.8 6.4 5.0 5.0 5.1  NEUTROABS  --  3.8  --   --   --   HGB 11.0* 10.5* 10.7* 9.5* 10.8*  HCT 33.3* 32.9* 33.4* 30.0* 32.8*  MCV 82.0 81.2 81.9 81.3 81.6  PLT 236 229 254 216 762   Basic Metabolic Panel:  Recent Labs Lab 06/03/16 0332 06/03/16 1536  06/04/16 0407 06/06/16 0323 06/07/16 0012 06/07/16 0516  NA 138  --  140 139 136 138  K 3.9  --  4.0 4.2 4.8 4.6  CL 105  --  103 101 104 106  CO2 27  --  27 30 25 27   GLUCOSE 175*  --  170* 125* 188* 97  BUN 30*  --  25* 30* 34* 29*  CREATININE 1.54* 1.55* 1.37* 1.51* 1.40* 1.30*  CALCIUM 8.0*  --  8.4* 8.2* 8.5* 8.4*   GFR: Estimated Creatinine Clearance: 68.5 mL/min (A) (by C-G formula based on SCr of 1.3 mg/dL (H)). Liver Function Tests: No results for input(s): AST, ALT, ALKPHOS, BILITOT, PROT, ALBUMIN in the last 168 hours. No results for input(s): LIPASE, AMYLASE in the last 168 hours. No results for input(s): AMMONIA in the last 168 hours. Coagulation Profile:  Recent Labs Lab 06/03/16 0438  INR 0.96   Cardiac Enzymes:  Recent Labs Lab 06/01/16 1628 06/01/16 2123 06/02/16 0321 06/02/16 1123 06/06/16 1447  TROPONINI <0.03 <0.03 <0.03 <0.03 <0.03   BNP (last 3 results) No results for input(s): PROBNP in the last 8760 hours. HbA1C: No results for input(s): HGBA1C in the last 72 hours. CBG:  Recent Labs Lab 06/06/16 1142 06/06/16 1618 06/06/16 2129 06/07/16 0752 06/07/16 1202  GLUCAP 144* 137* 165* 86 145*   Lipid Profile: No results for input(s): CHOL, HDL, LDLCALC, TRIG, CHOLHDL, LDLDIRECT in the last 72 hours. Thyroid Function Tests: No results for input(s): TSH, T4TOTAL, FREET4, T3FREE, THYROIDAB in the last 72 hours. Anemia Panel: No results for input(s): VITAMINB12, FOLATE, FERRITIN, TIBC, IRON, RETICCTPCT in the last 72 hours. Sepsis Labs: No results for input(s): PROCALCITON, LATICACIDVEN in the last 168 hours.  No results found for this or any previous visit (from the past 240 hour(s)).   Radiology Studies: No results found.  Scheduled Meds: . aspirin EC  81 mg Oral Daily  . carvedilol  12.5 mg Oral BID WC  . enoxaparin (LOVENOX) injection  40 mg Subcutaneous Q24H  . fluticasone  2 spray Each Nare Daily  . furosemide  60 mg Oral BID   . gabapentin  300 mg Oral QHS  . insulin aspart  0-15 Units Subcutaneous TID WC  . insulin aspart  0-5 Units Subcutaneous QHS  . insulin glargine  40 Units Subcutaneous QHS  . isosorbide mononitrate  90 mg Oral Daily  . losartan  100 mg Oral Daily  . nicotine  14 mg Transdermal Daily  . omega-3 acid ethyl esters  1 g Oral Daily  . pantoprazole  40 mg Oral Daily  . ranolazine  500 mg Oral BID  . rosuvastatin  20 mg Oral q1800  . sodium chloride flush  3 mL Intravenous Q12H  . sodium chloride flush  3 mL Intravenous Q12H  . spironolactone  12.5 mg Oral Daily   Continuous Infusions: . sodium chloride    . sodium chloride       LOS: 6 days    Time spent: > 35 minutes  Velvet Bathe, MD [facility] Pager [phone]  If 7PM-7AM, please contact night-coverage [email or web address] Password [email or web address] 06/07/2016, 1:29 PM

## 2016-06-07 NOTE — Progress Notes (Signed)
Patient ID: Brad Mendoza, male   DOB: [birth year], 61 y.o.   MRN: [id number]    SUBJECTIVE: No complaints today, no chest pain.  He diuresed well again yesterday, weight down another 3 lbs.  He walked in hall yesterday without dyspnea.   Scheduled Meds: . aspirin EC  81 mg Oral Daily  . carvedilol  12.5 mg Oral BID WC  . enoxaparin (LOVENOX) injection  40 mg Subcutaneous Q24H  . fluticasone  2 spray Each Nare Daily  . furosemide  60 mg Oral BID  . gabapentin  300 mg Oral QHS  . insulin aspart  0-15 Units Subcutaneous TID WC  . insulin aspart  0-5 Units Subcutaneous QHS  . insulin glargine  40 Units Subcutaneous QHS  . isosorbide mononitrate  90 mg Oral Daily  . losartan  100 mg Oral Daily  . nicotine  14 mg Transdermal Daily  . omega-3 acid ethyl esters  1 g Oral Daily  . pantoprazole  40 mg Oral Daily  . ranolazine  500 mg Oral BID  . rosuvastatin  20 mg Oral q1800  . sodium chloride flush  3 mL Intravenous Q12H  . sodium chloride flush  3 mL Intravenous Q12H  . spironolactone  12.5 mg Oral Daily   Continuous Infusions: PRN Meds:.sodium chloride, sodium chloride, acetaminophen, albuterol, nitroGLYCERIN, ondansetron (ZOFRAN) IV, oxyCODONE-acetaminophen, sodium chloride flush, sodium chloride flush    Vitals:   06/06/16 1349 06/06/16 1624 06/06/16 2007 06/07/16 0438  BP: (!) 112/59 136/88 (!) 112/97 129/74  Pulse: 69 71 71 68  Resp: 16  17 16   Temp: 98.3 F (36.8 C)  98.7 F (37.1 C) 98.5 F (36.9 C)  TempSrc: Oral  Oral Oral  SpO2: 94%  98% 98%  Weight:    207 lb 6.4 oz (94.1 kg)  Height:        Intake/Output Summary (Last 24 hours) at 06/07/16 0821 Last data filed at 06/07/16 0430  Gross per 24 hour  Intake              690 ml  Output             3150 ml  Net            -2460 ml    LABS: Basic Metabolic Panel:  Recent Labs  06/07/16 0012 06/07/16 0516  NA 136 138  K 4.8 4.6  CL 104 106  CO2 25 27  GLUCOSE 188* 97  BUN 34* 29*  CREATININE 1.40* 1.30*   CALCIUM 8.5* 8.4*   Liver Function Tests: No results for input(s): AST, ALT, ALKPHOS, BILITOT, PROT, ALBUMIN in the last 72 hours. No results for input(s): LIPASE, AMYLASE in the last 72 hours. CBC:  Recent Labs  06/07/16 0012  WBC 5.1  HGB 10.8*  HCT 32.8*  MCV 81.6  PLT 238   Cardiac Enzymes:  Recent Labs  06/06/16 1447  TROPONINI <0.03   BNP: Invalid input(s): POCBNP D-Dimer: No results for input(s): DDIMER in the last 72 hours. Hemoglobin A1C: No results for input(s): HGBA1C in the last 72 hours. Fasting Lipid Panel: No results for input(s): CHOL, HDL, LDLCALC, TRIG, CHOLHDL, LDLDIRECT in the last 72 hours. Thyroid Function Tests: No results for input(s): TSH, T4TOTAL, T3FREE, THYROIDAB in the last 72 hours.  Invalid input(s): FREET3 Anemia Panel: No results for input(s): VITAMINB12, FOLATE, FERRITIN, TIBC, IRON, RETICCTPCT in the last 72 hours.  RADIOLOGY: Dg Chest 2 View  Result Date: 06/01/2016 CLINICAL DATA:  Exertional shortness  of breath, left-sided chest discomfort, nonproductive cough, pain between the shoulder blades for several days. History of CHF, coronary artery disease, and diabetes. EXAM: CHEST  2 VIEW COMPARISON:  PA and lateral chest x-ray of June 01, 2016 at 2:17 p.m. Also PA and lateral chest x-ray of April 17, 2015. FINDINGS: The lungs are adequately inflated. Chronically increased lung markings at the lung bases are noted, but these are more conspicuous than on the study of approximately 2 hours ago. There remains a small right pleural effusion. Bibasilar interstitial densities persist and are slightly more conspicuous. The cardiac silhouette is enlarged. The central pulmonary vascularity is more engorged. There is fluid in the minor fissure. The trachea is midline. IMPRESSION: Chronic interstitial lung disease. Interval worsening in the appearance of the lung bases consistent with interstitial edema secondary to CHF. Worsening of atelectasis or  pneumonia is felt less likely. Persistent pleural fluid and pleural thickening at the right lung base. Electronically Signed   By: David  Martinique M.D.   On: 06/01/2016 16:34   Dg Chest 2 View  Result Date: 06/01/2016 CLINICAL DATA:  Shortness of breath, history of hypertension, CHF, diabetes, coronary artery disease EXAM: CHEST  2 VIEW COMPARISON:  PA and lateral chest x-ray of November 16, 2015 FINDINGS: The lungs are adequately inflated. There are bilateral pleural effusions greatest on the right. There is fluid within the fissures. The interstitial markings of both lungs are coarse but have improved since the previous study. The cardiac silhouette remains enlarged. The pulmonary vascularity is not clearly engorged. The bony thorax exhibits no acute abnormality. IMPRESSION: Chronic bronchitic changes with superimposed low-grade CHF. There are small bilateral pleural effusions greatest on the right. There is no alveolar pneumonia. Thoracic aortic atherosclerosis. Electronically Signed   By: David  Martinique M.D.   On: 06/01/2016 15:30    PHYSICAL EXAM General: NAD Neck: No JVD, no thyromegaly or thyroid nodule.  Lungs: Clear to auscultation bilaterally with normal respiratory effort. CV: Nondisplaced PMI.  Heart regular S1/S2, no S3/S4, no murmur.  1+ right ankle edema, trace on left.  No carotid bruit.  Normal pedal pulses.  Abdomen: Soft, nontender, no hepatosplenomegaly, no distention.  Neurologic: Alert and oriented x 3.  Psych: Normal affect. Extremities: No clubbing or cyanosis.   TELEMETRY: Personally reviewed telemetry pt in NSR  ASSESSMENT AND PLAN:  1. Acute on chronic systolic CHF: Last echo with EF 35-40% in 12/17, EF looked worse on LV-gram this admission.  Has had  nonischemic cardiomyopathy, cath in 2005 without significant CAD.  More extensive CAD on cath this admission but still think cardiomyopathy is nonischemic as the CAD he has should not cause a significant fall in EF.  He  was admitted with both exertional dyspnea and exertional chest pain.  He was volume overloaded initially and has been diuresed.  He has diuresed quite well, weight down 23 lbs total.  Breathing better.  No JVD today though still some edema in ankles.  Creatinine down to 1.3.    - Lasix 60 mg po bid (was on 60 mg po daily prior to admission).  - Continue Coreg.  - Continue spironolactone 12.5 daily.  - He will get losartan 100 mg daily today and tomorrow, would like him to start Entresto 24/26 bid instead of losartan on Thursday morning.  - Will get echo today to confirm fall in EF.  2. CAD: He was admitted with exertional chest pain as well as dyspnea. No ACS.  I reviewed his cath films  with Dr. Gwenlyn Found.  He has moderate disease, 50-60% range in the RCA and the proximal LAD though the area of disease is fairly long in the LAD. Most significant lesion is 95+% stenosis in ostial D1, relatively small caliber vessel not amenable to PCI, this may be source of angina.  TCTS saw, recommended medical management versus PCI. No good PCI option, and I do not think that putting him through CABG at this point is going to appreciably help his LV systolic function, suspect low EF cannot be explained primarily by coronary disease.  - Continue ASA 81.  - Continue Coreg and Imdur.  - Start ranolazine 500 mg bid for angina, check ECG in am.  3. AKI: Creatinine improving.   4. DM: Needs better control long-term.   Would have him walk around today, make sure through care management that he can get ranolazine and Entresto.  If he does well on current po meds, probably home tomorrow and start on Entresto as outpatient (1st dose Thursday am => can give losartan in hospital Wednesday am and send him home on Entresto to start Thursday).   Loralie Champagne 06/07/2016 8:34 AM

## 2016-06-07 NOTE — Care Management Note (Addendum)
Case Management Note  Patient Details  Name: Brad Mendoza MRN: [id number] Date of Birth: March 21, 1955  Subjective/Objective:   Pt presented for Chest Pain- post cardiac cath revealed moderate to severe LAD Stenosis. CVTS consulted not a candidate for CABG.                  Action/Plan: Benefits check completed for Entresto and Ranexa:   1. RANOLAZINE 500 MG BID  COVER- NOT COVER  PRIOR APPROVAL- YES # [phone] FOR EXCEPTION   2. RANEXA 500 MG BID  COVER- NOT COVER  PRIOR APPROVAL- YES # [phone] FOR EXCEPTION   3. ENTRESO 24/26 MG  COVER- YES  CO-PAY- $ 40.00  TIER- 3 DRUG  PRIOR APPROVAL- NO   4.ENTRESTO 49/51 MG  SAME AS ABOVE   5. ENTRESTO 97/103 MG  SAME AS ABOVE   PHARMACY: [facility] [place]  ANY RETAIL   Expected Discharge Date:                  Expected Discharge Plan:  Home/Self Care  In-House Referral:  NA  Discharge planning Services  CM Consult, Medication Assistance  Post Acute Care Choice:  NA Choice offered to:  NA  DME Arranged:  N/A DME Agency:  NA  HH Arranged:  NA HH Agency:  NA  Status of Service:  Completed, signed off  If discussed at [place] of Stay Meetings, dates discussed:    Additional Comments: 1013 06-08-16 Jacqlyn Krauss, RN,BSN [phone] MD placed call for prior authorization for Ranexa and cost will be $8.00. Heart Failure Pharmacist provided pt with the 30 day free [employer]. CM did call the [facility] and medication Delene Loll is available. No further needs from CM at this time. Bethena Roys, RN 06/07/2016, 2:35 PM

## 2016-06-08 DIAGNOSIS — N182 Chronic kidney disease, stage 2 (mild): Secondary | ICD-10-CM

## 2016-06-08 LAB — GLUCOSE, CAPILLARY
GLUCOSE-CAPILLARY: 146 mg/dL — AB (ref 65–99)
GLUCOSE-CAPILLARY: 142 mg/dL — AB (ref 65–99)

## 2016-06-08 LAB — BASIC METABOLIC PANEL
ANION GAP: 6 (ref 5–15)
BUN: 29 mg/dL — AB (ref 6–20)
CHLORIDE: 104 mmol/L (ref 101–111)
CO2: 27 mmol/L (ref 22–32)
Calcium: 8.6 mg/dL — ABNORMAL LOW (ref 8.9–10.3)
Creatinine, Ser: 1.38 mg/dL — ABNORMAL HIGH (ref 0.61–1.24)
GFR calc Af Amer: >60 mL/min (ref >60)
GFR, EST NON AFRICAN AMERICAN: 54 mL/min — AB (ref >60)
GLUCOSE: 153 mg/dL — AB (ref 65–99)
POTASSIUM: 4.3 mmol/L (ref 3.5–5.1)
Sodium: 137 mmol/L (ref 135–145)

## 2016-06-08 MED ORDER — FUROSEMIDE 20 MG PO TABS: 60.0000 mg | ORAL_TABLET | Freq: Two times a day (BID) | ORAL | 0 refills | Status: DC

## 2016-06-08 MED ORDER — RANOLAZINE ER 500 MG PO TB12: 500.0000 mg | ORAL_TABLET | Freq: Two times a day (BID) | ORAL | 0 refills | Status: DC

## 2016-06-08 MED ORDER — SACUBITRIL-VALSARTAN 24-26 MG PO TABS: 1.0000 | ORAL_TABLET | Freq: Two times a day (BID) | ORAL | 0 refills | Status: DC

## 2016-06-08 MED ORDER — SACUBITRIL-VALSARTAN 24-26 MG PO TABS: 1.0000 | ORAL_TABLET | Freq: Two times a day (BID) | ORAL | Status: DC

## 2016-06-08 MED ORDER — ISOSORBIDE MONONITRATE ER 30 MG PO TB24: 90.0000 mg | ORAL_TABLET | Freq: Every day | ORAL | 0 refills | Status: DC

## 2016-06-08 NOTE — Discharge Summary (Signed)
Physician Discharge Summary  Brad Mendoza GQQ:[id number] DOB: [birth year] DOA: 06/01/2016  PCP: [name], MD  Admit date: 06/01/2016 Discharge date: 06/08/2016  Admitted From: Home  Disposition:  Home   Recommendations for Outpatient Follow-up:  1. Follow up with PCP in 1- week 2. Furosemide increased to 60 mg po bid 3. Patient has been started on Entresto (first dose 04/19) 4. Follow up echocardiogram in 6 mo, if no improving LV systolic function will need ICD.  5. Follow up with Heart Failure Clinic in 10 days.   Home Health: no  Equipment/Devices: no   Discharge Condition: Stable  CODE STATUS: Full  Diet recommendation: Heart Healthy   Brief/Interim Summary: This is a 61 yo male with systolic heart failure who presented to the hospital with the chief complain of chest pain and dyspnea. Worsening symptoms for last 2-3 days prior to hospitalization, associated with orthopnea and lower extremity edema.  On initial physical examination his blood pressure 167/93, heart rate 87, respiratory rate 27, oxygen saturation 96%, his lungs were clear to auscultation bilaterally, heart S1-S2 present and rhythmic, abdomen soft nontender, positive lower extremity edema 2+, pitting. Sodium 140, potassium 4.5, chloride 107, bicarbonate 28, glucose 181, BUN 27, creatinine 1.5, calcium 8.3, troponin less than 0.03, BNP 240. White cell count 5.8, hemoglobin 11.0, hematocrit 33.3, platelets 236. His chest x-ray had interstitial markings bilaterally with bilateral pleural effusions more right than left. EKG was sinus rhythm with poor R-wave progressions. No ST elevations or ST depressions, no significant T wave abnormalities.   The patient was admitted to the hospital with a working diagnosis of decompensated systolic heart failure complicated by cardiogenic pulmonary edema.  1. Systolic heart failure acute and chronic. Nonischemic cardiomyopathy. Patient was admitted to the medical floor with a remote  telemetry, aggressive diuresis with intravenous furosemide, negative fluid balance was achieved ( - 12,707 ml since admission), with significant improvement of patient's symptoms. Follow-up echocardiography showed ejection fraction of 30-35% with diffuse hypokinesis. Heart failure team was consulted, heart failure medical regimen was optimized, patient will be discharged coreg, Entresto, isosorbide, spironolactone and furosemide. Heart failure clinic follow-up in 10 days, will need repeat echocardiography in 6 months, if persistent decrease in LV systolic function will need an AICD.   2. Cardiogenic pulmonary edema. Patient was diuresed aggressively, continue oximetry monitoring and supplemental nasal cannula, oxygen saturation 100% on room air by the time of discharge.  3. Hypertension. No pressure control with indoor, carvedilol, patient received lisinopril that was posteriorly switched to losartan, by the time of discharge he has been changed to [employer]. Systolic blood pressure 671 to 120 mmHg.   4. Chronic kidney disease stage II. Calculated GFR 62 based on a creatinine of 1.3. Close kidney function monitoring was performed during his hospitalization. Discharge potassium 4.3 with a serum bicarbonate 27. Recommendations for a close follow-up as an outpatient. Continue furosemide and spironolactone, avoid potassium supplements due to risk of hyperkalemia.   5. Type 2 diabetes mellitus. Patient was placed on insulin sliding scale for glucose coverage and monitoring capillary glucose over last 24 hours 145, 96, 180, 146, 142. Patient will resume his home regimen of insulin glargine 70 units daily plus insulin lispro, by time of discharge.   Discharge Diagnoses:  Principal Problem:   Anginal pain ([place]) Active Problems:   Hyperlipemia   Tobacco dependence   Essential hypertension   Nonischemic cardiomyopathy (HCC)   Type 2 diabetes mellitus with complications (HCC)   CHF exacerbation  ([place])  Discharge Instructions   Allergies as of 06/08/2016      Reactions   Sulfa Antibiotics Shortness Of Breath   Penicillins Rash   Has patient had a PCN reaction causing immediate rash, facial/tongue/throat swelling, SOB or lightheadedness with hypotension: No Has patient had a PCN reaction causing severe rash involving mucus membranes or skin necrosis: No Has patient had a PCN reaction that required hospitalization No Has patient had a PCN reaction occurring within the last 10 years: No If all of the above answers are "NO", then may proceed with Cephalosporin use.      Medication List    STOP taking these medications   amLODipine 5 MG tablet Commonly known as:  NORVASC   lisinopril 40 MG tablet Commonly known as:  PRINIVIL,ZESTRIL     TAKE these medications   albuterol 108 (90 Base) MCG/ACT inhaler Commonly known as:  PROVENTIL HFA;VENTOLIN HFA Inhale 2 puffs into the lungs every 6 (six) hours as needed for wheezing or shortness of breath.   aspirin EC 81 MG tablet Take 81 mg by mouth daily.   carvedilol 12.5 MG tablet Commonly known as:  COREG TAKE ONE TABLET TWICE A DAY WITH FOOD   CENTRUM SILVER ADULT 50+ PO Take 1 tablet by mouth daily.   Fish Oil 1000 MG Cpdr Take 1 capsule by mouth daily.   fluticasone 50 MCG/ACT nasal spray Commonly known as:  FLONASE USE 2 SPRAYS IN EACH NOSTRIL DAILY   furosemide 20 MG tablet Commonly known as:  LASIX Take 3 tablets (60 mg total) by mouth 2 (two) times daily. What changed:  medication strength  when to take this   gabapentin 300 MG capsule Commonly known as:  NEURONTIN Take 1 capsule (300 mg total) by mouth at bedtime.   ICY HOT ARTHRITIS THERAPY EX Apply 1 application topically 2 (two) times daily as needed (back pain).   Insulin Glargine 300 UNIT/ML Sopn Commonly known as:  TOUJEO SOLOSTAR Inject 70-90 Units into the skin daily. What changed:  how much to take   insulin lispro 100 UNIT/ML  injection Commonly known as:  HUMALOG Inject 3 Units into the skin at bedtime.   isosorbide mononitrate 30 MG 24 hr tablet Commonly known as:  IMDUR Take 3 tablets (90 mg total) by mouth daily. Start taking on:  06/09/2016 What changed:  See the new instructions.   metFORMIN 500 MG tablet Commonly known as:  GLUCOPHAGE Take 1 tablet (500 mg total) by mouth 2 (two) times daily with a meal.   nitroGLYCERIN 0.4 MG SL tablet Commonly known as:  NITROSTAT DISSOLVE 1 TAB UNDER TOUNGE FOR CHEST PAIN. MAY REPEAT EVERY 5 MINUTES FOR 3 DOSES. IF NO RELIEF CALL 911 OR GO TO ER   pantoprazole 40 MG tablet Commonly known as:  PROTONIX TAKE ONE (1) TABLET EACH DAY   ranolazine 500 MG 12 hr tablet Commonly known as:  RANEXA Take 1 tablet (500 mg total) by mouth 2 (two) times daily.   rosuvastatin 10 MG tablet Commonly known as:  CRESTOR TAKE ONE (1) TABLET EACH DAY   sacubitril-valsartan 24-26 MG Commonly known as:  ENTRESTO Take 1 tablet by mouth 2 (two) times daily. Start taking on:  06/09/2016   spironolactone 25 MG tablet Commonly known as:  ALDACTONE TAKE 1/2 TABLET DAILY   terbinafine 1 % cream Commonly known as:  LAMISIL Apply 1 application topically 2 (two) times daily. Apply to both feet and between toes   ULTICARE MINI PEN NEEDLES 31G  X 6 MM Misc Generic drug:  Insulin Pen Needle 4 TIMES DAILY      Follow-up Information    [facility] Follow up on 06/16/2016.   Specialty:  Cardiology Why:  at noon for follow up with Dr. Claris Gladden NP.  Contact information: [address] [id number] [place] [facility] Follow up.   Why:  Please pick up Entresto Medication at this location.  Contact information: Left turn @ [place]:  [address] [place], [place] [address] Hours: Open  Closes 6PM Phone: [phone]          Allergies  Allergen Reactions  . Sulfa Antibiotics Shortness Of Breath  . Penicillins Rash    Has patient had a PCN reaction causing immediate rash, facial/tongue/throat swelling, SOB or lightheadedness with hypotension: No Has patient had a PCN reaction causing severe rash involving mucus membranes or skin necrosis: No Has patient had a PCN reaction that required hospitalization No Has patient had a PCN reaction occurring within the last 10 years: No If all of the above answers are "NO", then may proceed with Cephalosporin use.     Consultations:  Heart failure    Procedures/Studies: Dg Chest 2 View  Result Date: 06/01/2016 CLINICAL DATA:  Exertional shortness of breath, left-sided chest discomfort, nonproductive cough, pain between the shoulder blades for several days. History of CHF, coronary artery disease, and diabetes. EXAM: CHEST  2 VIEW COMPARISON:  PA and lateral chest x-ray of June 01, 2016 at 2:17 p.m. Also PA and lateral chest x-ray of April 17, 2015. FINDINGS: The lungs are adequately inflated. Chronically increased lung markings at the lung bases are noted, but these are more conspicuous than on the study of approximately 2 hours ago. There remains a small right pleural effusion. Bibasilar interstitial densities persist and are slightly more conspicuous. The cardiac silhouette is enlarged. The central pulmonary vascularity is more engorged. There is fluid in the minor fissure. The trachea is midline. IMPRESSION: Chronic interstitial lung disease. Interval worsening in the appearance of the lung bases consistent with interstitial edema secondary to CHF. Worsening of atelectasis or pneumonia is felt less likely. Persistent pleural fluid and pleural thickening at the right lung base. Electronically Signed   By: David  Martinique M.D.   On: 06/01/2016 16:34   Dg Chest 2 View  Result Date: 06/01/2016 CLINICAL DATA:  Shortness of breath, history of hypertension, CHF, diabetes,  coronary artery disease EXAM: CHEST  2 VIEW COMPARISON:  PA and lateral chest x-ray of November 16, 2015 FINDINGS: The lungs are adequately inflated. There are bilateral pleural effusions greatest on the right. There is fluid within the fissures. The interstitial markings of both lungs are coarse but have improved since the previous study. The cardiac silhouette remains enlarged. The pulmonary vascularity is not clearly engorged. The bony thorax exhibits no acute abnormality. IMPRESSION: Chronic bronchitic changes with superimposed low-grade CHF. There are small bilateral pleural effusions greatest on the right. There is no alveolar pneumonia. Thoracic aortic atherosclerosis. Electronically Signed   By: David  Martinique M.D.   On: 06/01/2016 15:30      Subjective: Patient feeling better, no dyspnea or chest pain, improved lower extremity edema. No nausea or vomiting, ambulating well.   Discharge Exam: Vitals:   06/08/16 0519 06/08/16 0840  BP: 127/82 (!) 104/51  Pulse: 72 74  Resp: 18   Temp:  97.8 F (36.6 C)    Vitals:   06/07/16 1829 06/07/16 2125 06/08/16 0519 06/08/16 0840  BP: 135/88 129/69 127/82 (!) 104/51  Pulse: 76 69 72 74  Resp:  15 18   Temp:  98 F (36.7 C) 97.8 F (36.6 C)   TempSrc:  Oral Oral   SpO2:  99% 100%   Weight:   93.8 kg (206 lb 14.4 oz)   Height:        General: Pt is alert, awake, not in acute distress Cardiovascular: RRR, S1/S2 +, no rubs, no gallops Respiratory: CTA bilaterally, no wheezing, no rhonchi Abdominal: Soft, NT, ND, bowel sounds + Extremities: no edema, no cyanosis    The results of significant diagnostics from this hospitalization (including imaging, microbiology, ancillary and laboratory) are listed below for reference.     Microbiology: No results found for this or any previous visit (from the past 240 hour(s)).   Labs: BNP (last 3 results)  Recent Labs  11/16/15 1150 06/01/16 1628 06/06/16 0323  BNP 229.0* 240.0* 137.1*    Basic Metabolic Panel:  Recent Labs Lab 06/04/16 0407 06/06/16 0323 06/07/16 0012 06/07/16 0516 06/08/16 0547  NA 140 139 136 138 137  K 4.0 4.2 4.8 4.6 4.3  CL 103 101 104 106 104  CO2 27 30 25 27 27   GLUCOSE 170* 125* 188* 97 153*  BUN 25* 30* 34* 29* 29*  CREATININE 1.37* 1.51* 1.40* 1.30* 1.38*  CALCIUM 8.4* 8.2* 8.5* 8.4* 8.6*   Liver Function Tests: No results for input(s): AST, ALT, ALKPHOS, BILITOT, PROT, ALBUMIN in the last 168 hours. No results for input(s): LIPASE, AMYLASE in the last 168 hours. No results for input(s): AMMONIA in the last 168 hours. CBC:  Recent Labs Lab 06/01/16 1628 06/01/16 2123 06/03/16 1536 06/04/16 0407 06/07/16 0012  WBC 5.8 6.4 5.0 5.0 5.1  NEUTROABS  --  3.8  --   --   --   HGB 11.0* 10.5* 10.7* 9.5* 10.8*  HCT 33.3* 32.9* 33.4* 30.0* 32.8*  MCV 82.0 81.2 81.9 81.3 81.6  PLT 236 229 254 216 238   Cardiac Enzymes:  Recent Labs Lab 06/01/16 1628 06/01/16 2123 06/02/16 0321 06/02/16 1123 06/06/16 1447  TROPONINI <0.03 <0.03 <0.03 <0.03 <0.03   BNP: Invalid input(s): POCBNP CBG:  Recent Labs Lab 06/07/16 1202 06/07/16 1732 06/07/16 2122 06/08/16 0719 06/08/16 1114  GLUCAP 145* 96 180* 146* 142*   D-Dimer No results for input(s): DDIMER in the last 72 hours. Hgb A1c No results for input(s): HGBA1C in the last 72 hours. Lipid Profile No results for input(s): CHOL, HDL, LDLCALC, TRIG, CHOLHDL, LDLDIRECT in the last 72 hours. Thyroid function studies No results for input(s): TSH, T4TOTAL, T3FREE, THYROIDAB in the last 72 hours.  Invalid input(s): FREET3 Anemia work up No results for input(s): VITAMINB12, FOLATE, FERRITIN, TIBC, IRON, RETICCTPCT in the last 72 hours. Urinalysis    Component Value Date/Time   COLORURINE YELLOW 11/19/2015 1620   APPEARANCEUR CLEAR 11/19/2015 1620   LABSPEC >1.030 (H) 11/19/2015 1620   PHURINE 5.5 11/19/2015 1620   GLUCOSEU 250 (A) 11/19/2015 1620   HGBUR TRACE (A)  11/19/2015 1620   BILIRUBINUR NEGATIVE 11/19/2015 1620   BILIRUBINUR neg 11/07/2014 1154   KETONESUR NEGATIVE 11/19/2015 1620   PROTEINUR 100 (A) 11/19/2015 1620   UROBILINOGEN negative 11/07/2014 1154   UROBILINOGEN 0.2 05/07/2013 0851   NITRITE NEGATIVE 11/19/2015 1620   LEUKOCYTESUR NEGATIVE 11/19/2015 1620   Sepsis Labs Invalid input(s): PROCALCITONIN,  WBC,  [facility] No results found for this or any previous visit (from the past 240 hour(s)).   Time coordinating discharge: 45 minutes  SIGNED:   Tawni Millers, MD  [facility] 06/08/2016, 12:55 PM Pager   If 7PM-7AM, please contact night-coverage [email or web address] Password [email or web address]

## 2016-06-08 NOTE — Progress Notes (Signed)
Patient ID: Brad Mendoza, male   DOB: [birth year], 61 y.o.   MRN: [id number]    SUBJECTIVE: Mild chest soreness 1 time yesterday, otherwise no chest pain.  Weight down another pound.  He walked in hall yesterday without dyspnea. He has a headache, may be from Imdur.   ECG: Personally reviewed, NSR, QTc 473 msec  Scheduled Meds: . aspirin EC  81 mg Oral Daily  . carvedilol  12.5 mg Oral BID WC  . enoxaparin (LOVENOX) injection  40 mg Subcutaneous Q24H  . fluticasone  2 spray Each Nare Daily  . furosemide  60 mg Oral BID  . gabapentin  300 mg Oral QHS  . insulin aspart  0-15 Units Subcutaneous TID WC  . insulin aspart  0-5 Units Subcutaneous QHS  . insulin glargine  40 Units Subcutaneous QHS  . isosorbide mononitrate  90 mg Oral Daily  . losartan  100 mg Oral Daily  . nicotine  14 mg Transdermal Daily  . omega-3 acid ethyl esters  1 g Oral Daily  . pantoprazole  40 mg Oral Daily  . ranolazine  500 mg Oral BID  . rosuvastatin  20 mg Oral q1800  . sodium chloride flush  3 mL Intravenous Q12H  . sodium chloride flush  3 mL Intravenous Q12H  . spironolactone  12.5 mg Oral Daily   Continuous Infusions: . sodium chloride    . sodium chloride     PRN Meds:.sodium chloride, sodium chloride, acetaminophen, albuterol, nitroGLYCERIN, ondansetron (ZOFRAN) IV, oxyCODONE-acetaminophen, sodium chloride flush, sodium chloride flush    Vitals:   06/07/16 1432 06/07/16 1829 06/07/16 2125 06/08/16 0519  BP: 131/77 135/88 129/69 127/82  Pulse: 68 76 69 72  Resp: (!) 25  15 18   Temp: 98.2 F (36.8 C)  98 F (36.7 C) 97.8 F (36.6 C)  TempSrc: Oral  Oral Oral  SpO2: 96%  99% 100%  Weight:    206 lb 14.4 oz (93.8 kg)  Height:        Intake/Output Summary (Last 24 hours) at 06/08/16 0825 Last data filed at 06/08/16 0715  Gross per 24 hour  Intake             1240 ml  Output             3055 ml  Net            -1815 ml    LABS: Basic Metabolic Panel:  Recent Labs  06/07/16 0516  06/08/16 0547  NA 138 137  K 4.6 4.3  CL 106 104  CO2 27 27  GLUCOSE 97 153*  BUN 29* 29*  CREATININE 1.30* 1.38*  CALCIUM 8.4* 8.6*   Liver Function Tests: No results for input(s): AST, ALT, ALKPHOS, BILITOT, PROT, ALBUMIN in the last 72 hours. No results for input(s): LIPASE, AMYLASE in the last 72 hours. CBC:  Recent Labs  06/07/16 0012  WBC 5.1  HGB 10.8*  HCT 32.8*  MCV 81.6  PLT 238   Cardiac Enzymes:  Recent Labs  06/06/16 1447  TROPONINI <0.03   BNP: Invalid input(s): POCBNP D-Dimer: No results for input(s): DDIMER in the last 72 hours. Hemoglobin A1C: No results for input(s): HGBA1C in the last 72 hours. Fasting Lipid Panel: No results for input(s): CHOL, HDL, LDLCALC, TRIG, CHOLHDL, LDLDIRECT in the last 72 hours. Thyroid Function Tests: No results for input(s): TSH, T4TOTAL, T3FREE, THYROIDAB in the last 72 hours.  Invalid input(s): FREET3 Anemia Panel: No results for input(s): VITAMINB12,  FOLATE, FERRITIN, TIBC, IRON, RETICCTPCT in the last 72 hours.  RADIOLOGY: Dg Chest 2 View  Result Date: 06/01/2016 CLINICAL DATA:  Exertional shortness of breath, left-sided chest discomfort, nonproductive cough, pain between the shoulder blades for several days. History of CHF, coronary artery disease, and diabetes. EXAM: CHEST  2 VIEW COMPARISON:  PA and lateral chest x-ray of June 01, 2016 at 2:17 p.m. Also PA and lateral chest x-ray of April 17, 2015. FINDINGS: The lungs are adequately inflated. Chronically increased lung markings at the lung bases are noted, but these are more conspicuous than on the study of approximately 2 hours ago. There remains a small right pleural effusion. Bibasilar interstitial densities persist and are slightly more conspicuous. The cardiac silhouette is enlarged. The central pulmonary vascularity is more engorged. There is fluid in the minor fissure. The trachea is midline. IMPRESSION: Chronic interstitial lung disease. Interval  worsening in the appearance of the lung bases consistent with interstitial edema secondary to CHF. Worsening of atelectasis or pneumonia is felt less likely. Persistent pleural fluid and pleural thickening at the right lung base. Electronically Signed   By: David  Martinique M.D.   On: 06/01/2016 16:34   Dg Chest 2 View  Result Date: 06/01/2016 CLINICAL DATA:  Shortness of breath, history of hypertension, CHF, diabetes, coronary artery disease EXAM: CHEST  2 VIEW COMPARISON:  PA and lateral chest x-ray of November 16, 2015 FINDINGS: The lungs are adequately inflated. There are bilateral pleural effusions greatest on the right. There is fluid within the fissures. The interstitial markings of both lungs are coarse but have improved since the previous study. The cardiac silhouette remains enlarged. The pulmonary vascularity is not clearly engorged. The bony thorax exhibits no acute abnormality. IMPRESSION: Chronic bronchitic changes with superimposed low-grade CHF. There are small bilateral pleural effusions greatest on the right. There is no alveolar pneumonia. Thoracic aortic atherosclerosis. Electronically Signed   By: David  Martinique M.D.   On: 06/01/2016 15:30    PHYSICAL EXAM General: NAD Neck: No JVD, no thyromegaly or thyroid nodule.  Lungs: Clear to auscultation bilaterally with normal respiratory effort. CV: Nondisplaced PMI.  Heart regular S1/S2, no S3/S4, no murmur.  1+ right ankle edema, trace on left.  No carotid bruit.  Normal pedal pulses.  Abdomen: Soft, nontender, no hepatosplenomegaly, no distention.  Neurologic: Alert and oriented x 3.  Psych: Normal affect. Extremities: No clubbing or cyanosis.   TELEMETRY: Personally reviewed telemetry pt in NSR  ASSESSMENT AND PLAN:  1. Acute on chronic systolic CHF: Last echo with EF 35-40% in 12/17, Echo this admission with EF 30-35%, mild LV dilation, diffuse hypokinesis, moderate TR (probably not much change from 12/17).  Has had  nonischemic  cardiomyopathy, cath in 2005 without significant CAD.  More extensive CAD on cath this admission but still think cardiomyopathy is nonischemic as the CAD he has should not cause a significant fall in EF.  He was admitted with both exertional dyspnea and exertional chest pain.  He was volume overloaded initially and has been diuresed.  He has diuresed quite well, weight down 24 lbs total.  Breathing better.  No JVD today.  Creatinine down to 1.3.    - Lasix 60 mg po bid (was on 60 mg po daily prior to admission).  - Continue Coreg.  - Continue spironolactone 12.5 daily.  - He will get losartan 100 mg daily today, send home with Entresto 24/26 bid instead of losartan to start on Thursday morning.  - Will repeat  echo with medical management after about 6 months, ICD if EF remains low.  2. CAD: He was admitted with exertional chest pain as well as dyspnea. No ACS.  I reviewed his cath films with Dr. Gwenlyn Found.  He has moderate disease, 50-60% range in the RCA and the proximal LAD though the area of disease is fairly long in the LAD. Most significant lesion is 95+% stenosis in ostial D1, relatively small caliber vessel not amenable to PCI, this may be source of angina.  TCTS saw, recommended medical management versus PCI. No good PCI option, and I do not think that putting him through CABG at this point is going to appreciably help his LV systolic function, suspect low EF cannot be explained primarily by coronary disease.  - Continue ASA 81.  - Continue Coreg and Imdur. He has a headache, but appears that he was on Imdur 90 mg daily prior to admission.  Would continue for now, if headache does not improve, may have to cut back or stop in the future.  - Continue ranolazine 500 mg bid for angina, mild prolongation of QTc on ECG today but acceptable.   3. AKI: Creatinine improved.   4. DM: Needs better control long-term.  5. Disposition: OK for home today.  Will need followup in CHF clinic in 10 days or so.  Meds  for home: Lasix 60 mg po bid, spironolactone 12.5 daily, Entresto 24/26 bid to start tomorrow morning (do not send home with losartan, should NOT take both), Coreg 12.5 mg bid, ASA 81, Crestor 20 daily, Imdur 90 daily, ranolazine 500 mg bid.   Loralie Champagne 06/08/2016 8:25 AM

## 2016-06-15 ENCOUNTER — Ambulatory Visit (INDEPENDENT_AMBULATORY_CARE_PROVIDER_SITE_OTHER): Payer: Medicare Other | Admitting: Internal Medicine

## 2016-06-16 ENCOUNTER — Telehealth (HOSPITAL_COMMUNITY): Payer: Self-pay | Admitting: Cardiology

## 2016-06-16 ENCOUNTER — Inpatient Hospital Stay (HOSPITAL_COMMUNITY): Admit: 2016-06-16 | Payer: Medicare Other

## 2016-06-16 NOTE — Telephone Encounter (Signed)
I called patient to reschedule hosp follow up appt he missed with APP.  Home number states it is disconnected and no vm on patient's cell.  I called and left message on pts' emergency contact, daughter Mateo Flow, phone.  I asked emergency contact to have pt call our office to reschedule his appt that he had missed. I left our phone number and extension for scheduling.

## 2016-06-16 NOTE — Telephone Encounter (Signed)
Patient returned my call.  His hosp follow up appt has been rescheduled to 06/27/16 @3 :30 pm.  Patient requested we mailed him the appt info.  I mailed pt a new pt packet with info on parking garage.

## 2016-06-27 ENCOUNTER — Inpatient Hospital Stay (HOSPITAL_COMMUNITY): Admission: RE | Admit: 2016-06-27 | Payer: Medicare Other | Source: Ambulatory Visit

## 2016-06-30 ENCOUNTER — Encounter (HOSPITAL_COMMUNITY): Payer: Self-pay

## 2016-06-30 ENCOUNTER — Ambulatory Visit (HOSPITAL_COMMUNITY)
Admission: RE | Admit: 2016-06-30 | Discharge: 2016-06-30 | Disposition: A | Discharge status: Home / Self Care | Payer: Medicare Other | Source: Ambulatory Visit | Attending: Internal Medicine | Admitting: Internal Medicine

## 2016-06-30 ENCOUNTER — Ambulatory Visit: Payer: Self-pay | Admitting: Pharmacist

## 2016-06-30 VITALS — BP 144/80 | HR 86 | Wt 207.4 lb

## 2016-06-30 DIAGNOSIS — Z720 Tobacco use: Secondary | ICD-10-CM | POA: Insufficient documentation

## 2016-06-30 DIAGNOSIS — Z880 Allergy status to penicillin: Secondary | ICD-10-CM | POA: Diagnosis not present

## 2016-06-30 DIAGNOSIS — I10 Essential (primary) hypertension: Secondary | ICD-10-CM

## 2016-06-30 DIAGNOSIS — I428 Other cardiomyopathies: Secondary | ICD-10-CM

## 2016-06-30 DIAGNOSIS — E1122 Type 2 diabetes mellitus with diabetic chronic kidney disease: Secondary | ICD-10-CM | POA: Insufficient documentation

## 2016-06-30 DIAGNOSIS — I5022 Chronic systolic (congestive) heart failure: Secondary | ICD-10-CM | POA: Diagnosis present

## 2016-06-30 DIAGNOSIS — I5042 Chronic combined systolic (congestive) and diastolic (congestive) heart failure: Secondary | ICD-10-CM | POA: Diagnosis not present

## 2016-06-30 DIAGNOSIS — I130 Hypertensive heart and chronic kidney disease with heart failure and stage 1 through stage 4 chronic kidney disease, or unspecified chronic kidney disease: Secondary | ICD-10-CM | POA: Diagnosis not present

## 2016-06-30 DIAGNOSIS — Z7982 Long term (current) use of aspirin: Secondary | ICD-10-CM | POA: Diagnosis not present

## 2016-06-30 DIAGNOSIS — N183 Chronic kidney disease, stage 3 (moderate): Secondary | ICD-10-CM | POA: Diagnosis not present

## 2016-06-30 DIAGNOSIS — I2510 Atherosclerotic heart disease of native coronary artery without angina pectoris: Secondary | ICD-10-CM | POA: Insufficient documentation

## 2016-06-30 DIAGNOSIS — F1010 Alcohol abuse, uncomplicated: Secondary | ICD-10-CM | POA: Insufficient documentation

## 2016-06-30 DIAGNOSIS — I5043 Acute on chronic combined systolic (congestive) and diastolic (congestive) heart failure: Secondary | ICD-10-CM | POA: Diagnosis not present

## 2016-06-30 DIAGNOSIS — Z716 Tobacco abuse counseling: Secondary | ICD-10-CM

## 2016-06-30 DIAGNOSIS — Z794 Long term (current) use of insulin: Secondary | ICD-10-CM | POA: Diagnosis not present

## 2016-06-30 DIAGNOSIS — I25119 Atherosclerotic heart disease of native coronary artery with unspecified angina pectoris: Secondary | ICD-10-CM

## 2016-06-30 DIAGNOSIS — Z79899 Other long term (current) drug therapy: Secondary | ICD-10-CM | POA: Diagnosis not present

## 2016-06-30 DIAGNOSIS — I255 Ischemic cardiomyopathy: Secondary | ICD-10-CM | POA: Insufficient documentation

## 2016-06-30 LAB — BASIC METABOLIC PANEL
Anion gap: 6 (ref 5–15)
BUN: 38 mg/dL — AB (ref 6–20)
CALCIUM: 8.3 mg/dL — AB (ref 8.9–10.3)
CO2: 24 mmol/L (ref 22–32)
CREATININE: 1.43 mg/dL — AB (ref 0.61–1.24)
Chloride: 108 mmol/L (ref 101–111)
GFR calc Af Amer: >60 mL/min (ref >60)
GFR, EST NON AFRICAN AMERICAN: 52 mL/min — AB (ref >60)
Glucose, Bld: 233 mg/dL — ABNORMAL HIGH (ref 65–99)
POTASSIUM: 4.2 mmol/L (ref 3.5–5.1)
Sodium: 138 mmol/L (ref 135–145)

## 2016-06-30 MED ORDER — FUROSEMIDE 20 MG PO TABS: 40.0000 mg | ORAL_TABLET | ORAL | 3 refills | Status: DC

## 2016-06-30 MED ORDER — SACUBITRIL-VALSARTAN 49-51 MG PO TABS: 1.0000 | ORAL_TABLET | Freq: Two times a day (BID) | ORAL | 6 refills | Status: DC

## 2016-06-30 NOTE — Progress Notes (Signed)
Advanced Heart Failure Clinic Note   Primary Care: Dr. Alain Honey Primary Cardiologist: [facility], Dr. Aundra Dubin   HPI: [name] is a 61 year old male with a past medical history of chronic combined systolic and diastolic CHF last EF 82-50% (Dec. 2017), HTN, HLD, DM and CAD. Also with history of NICM, left heart cath in June 2005 with 40-50% mid LAD stenosis, EF 45% at that time.    Admitted on 06/01/16 with chest pain relieved with nitroglycerin and volume overload. Troponin negative. Admission weight was 230 pounds. Right and left heart cath on 06/03/16 with 70-75% stenosis in the proximal and mid LAD,90% ostial stenosis in the 1st diagonal, and  60-70% stenosis in the mid RCA. RHC showed Fick C.O. 5.86/index 2.74. PA mean 33 mm Hg, LVEDP 25 mm Hg. EF by cath appears to be <25%. Medical therapy was recommended in the absence of ACS, and PCI was felt to be high risk with severe cardiomyopathy. CVTS was consulted and felt that CABG would not benefit him as he had NICM prior to his CAD. Discharge weight 207 pounds.   He returns today for HF follow up. He is feeling well, able to walk around the grocery store without SOB. [place] without SOB. His weights at home are 208-209 pounds. He is taking all his medications with compliance. Eating much healthier than he used to, watching his salt intake. He continues to struggle with his alcoholic intake. He drinks about three 24 ounce beers 3-4 times a week. He denies chest pain, palpitations. He does have a cough today, denies fever. Denies orthopnea and PND.    Past Medical History:  Diagnosis Date  . Acid reflux   . Back pain    f4 and f5  . CAD (coronary artery disease)   . CHF (congestive heart failure) ([place])    last echo was in March 2016  . Diabetes mellitus 2005  . Diverticulosis   . H/O chest pain 2005   ECHO  . HTN (hypertension)   . Hyperlipidemia   . Nonischemic cardiomyopathy ([place])   . Onychomycosis   .  Polysubstance abuse     Current Outpatient Prescriptions  Medication Sig Dispense Refill  . albuterol (PROVENTIL HFA;VENTOLIN HFA) 108 (90 Base) MCG/ACT inhaler Inhale 2 puffs into the lungs every 6 (six) hours as needed for wheezing or shortness of breath. 1 Inhaler 2  . aspirin EC 81 MG tablet Take 81 mg by mouth daily.    . Capsaicin (ICY HOT ARTHRITIS THERAPY EX) Apply 1 application topically 2 (two) times daily as needed (back pain).     . carvedilol (COREG) 12.5 MG tablet TAKE ONE TABLET TWICE A DAY WITH FOOD 180 tablet 0  . fluticasone (FLONASE) 50 MCG/ACT nasal spray USE 2 SPRAYS IN EACH NOSTRIL DAILY 48 g 0  . furosemide (LASIX) 20 MG tablet Take 3 tablets (60 mg total) by mouth 2 (two) times daily. 90 tablet 0  . gabapentin (NEURONTIN) 300 MG capsule Take 1 capsule (300 mg total) by mouth at bedtime. 30 capsule 1  . Insulin Glargine (TOUJEO SOLOSTAR) 300 UNIT/ML SOPN Inject 70-90 Units into the skin daily. (Patient taking differently: Inject 70 Units into the skin daily. ) 9 mL 0  . insulin lispro (HUMALOG) 100 UNIT/ML injection Inject 3 Units into the skin at bedtime.    . isosorbide mononitrate (IMDUR) 30 MG 24 hr tablet Take 3 tablets (90 mg total) by mouth daily. 90 tablet 0  .  metFORMIN (GLUCOPHAGE) 500 MG tablet Take 1 tablet (500 mg total) by mouth 2 (two) times daily with a meal. 180 tablet 3  . Multiple Vitamins-Minerals (CENTRUM SILVER ADULT 50+ PO) Take 1 tablet by mouth daily.    . Omega-3 Fatty Acids (FISH OIL) 1000 MG CPDR Take 1 capsule by mouth daily.    . pantoprazole (PROTONIX) 40 MG tablet TAKE ONE (1) TABLET EACH DAY 90 tablet 0  . ranolazine (RANEXA) 500 MG 12 hr tablet Take 1 tablet (500 mg total) by mouth 2 (two) times daily. 60 tablet 0  . rosuvastatin (CRESTOR) 10 MG tablet TAKE ONE (1) TABLET EACH DAY 90 tablet 0  . sacubitril-valsartan (ENTRESTO) 24-26 MG Take 1 tablet by mouth 2 (two) times daily. 60 tablet 0  . spironolactone (ALDACTONE) 25 MG tablet TAKE  1/2 TABLET DAILY 45 tablet 0  . terbinafine (LAMISIL) 1 % cream Apply 1 application topically 2 (two) times daily. Apply to both feet and between toes 30 g 1  . ULTICARE MINI PEN NEEDLES 31G X 6 MM MISC 4 TIMES DAILY 100 each 1  . nitroGLYCERIN (NITROSTAT) 0.4 MG SL tablet DISSOLVE 1 TAB UNDER TOUNGE FOR CHEST PAIN. MAY REPEAT EVERY 5 MINUTES FOR 3 DOSES. IF NO RELIEF CALL 911 OR GO TO ER (Patient not taking: Reported on 06/30/2016) 25 tablet 2   No current facility-administered medications for this encounter.     Allergies  Allergen Reactions  . Sulfa Antibiotics Shortness Of Breath  . Penicillins Rash    Has patient had a PCN reaction causing immediate rash, facial/tongue/throat swelling, SOB or lightheadedness with hypotension: No Has patient had a PCN reaction causing severe rash involving mucus membranes or skin necrosis: No Has patient had a PCN reaction that required hospitalization No Has patient had a PCN reaction occurring within the last 10 years: No If all of the above answers are "NO", then may proceed with Cephalosporin use.       Social History   Social History  . Marital status: Married    Spouse name: N/A  . Number of children: N/A  . Years of education: N/A   Occupational History  . disabled    Social History Main Topics  . Smoking status: Never Smoker  . Smokeless tobacco: Current User    Types: Snuff     Comment: dips snuff about 6 cans/week for 3 years  . Alcohol use 0.6 oz/week    1 Cans of beer per week     Comment: occ - has cut down, now once every 2-3 months  . Drug use: Yes    Types: Marijuana     Comment: occasional use - smoking less  . Sexual activity: Yes   Other Topics Concern  . Not on file   Social History Narrative  . No narrative on file      Family History  Problem Relation Age of Onset  . Hypertension Father   . Diabetes Father   . Cancer Mother   . Alcohol abuse Brother   . Colon cancer Neg Hx     Vitals:   06/30/16  0941  BP: (!) 144/80  Pulse: 86  SpO2: 98%  Weight: 207 lb 6 oz (94.1 kg)     PHYSICAL EXAM: General:  Well appearing. No respiratory difficulty HEENT: normal Neck: supple. no JVD. Carotids 2+ bilat; no bruits. No lymphadenopathy or thyromegaly appreciated. Cor: PMI nondisplaced. Regular rate & rhythm. No rubs, gallops or murmurs. Lungs: clear Abdomen: soft,  nontender, nondistended. No hepatosplenomegaly. No bruits or masses. Good bowel sounds. Extremities: no cyanosis, clubbing, rash, edema Neuro: alert & oriented x 3, cranial nerves grossly intact. moves all 4 extremities w/o difficulty. Affect pleasant.     ASSESSMENT & PLAN: 1. 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 He had preexisting NICM diagnosed in 2005 when his EF was 35% and had normal cors. Echo in 05/2016 with EF 30-35% and cath showed  70-75% stenosis in the proximal and mid LAD,90% ostial stenosis in the 1st diagonal, and  60-70% stenosis in the mid RCA. [place] 05/2016-  Fick C.O. 5.86/index 2.74 - NYHA II - Volume status stable to dry.  - Will increase Entresto to 49/51mg  BID and decrease Lasix from 60mg  BID to 60mg  in the am to 40mg  in the pm. I suspect that we will be able to further reduce his diuretics with the increased Entresto.  - Continue Coreg 12.5mg  BID - Continue isosorbide 90 mg daily.  - Continue spiro 25mg  daily 2. CAD - left heart cath findings above.  - Medical management recommended. - PCI to LAD felt to be high risk in the setting of cardiomyopathy, CVTS consulted felt that CABG would not improve EF as he has pre existing CM.  - Chest pain free - Continue isosorbide, Ranexa - Continue ASA and statin.  3. ETOH abuse - He asked about the effects of alcohol on his heart. We talked about eliminating ETOH completely, he is agreeable.  4. Tobacco abuse - Chews tobacco, encouraged cessation.  5. HTN - BP elevated today,  - increasing Entresto as above.  6. CKD stage III - stable - BMET today.  7.  DM - Per PCP   BMET today, follow up in 4 weeks.   Arbutus Leas, NP 06/30/16

## 2016-06-30 NOTE — Patient Instructions (Signed)
INCREASE Entresto to 49/51 mg ,one tab twice a day DECREASE Lasix to 60 mg (3 tabs) in the AM and 40 mg (2 tabs) in the PM   Labs today We will only contact you if something comes back abnormal or we need to make some changes. Otherwise no news is good news!   Your physician recommends that you schedule a follow-up appointment in: 4 weeks with Erin Smith,NP   Do the following things EVERYDAY: 1) Weigh yourself in the morning before breakfast. Write it down and keep it in a log. 2) Take your medicines as prescribed 3) Eat low salt foods-Limit salt (sodium) to 2000 mg per day.  4) Stay as active as you can everyday 5) Limit all fluids for the day to less than 2 liters .

## 2016-07-04 ENCOUNTER — Other Ambulatory Visit: Payer: Self-pay | Admitting: *Deleted

## 2016-07-04 NOTE — Patient Outreach (Signed)
Former [facility] pt called 24 hour nurse advice line and had question about medication dosage. Pt was instructed to take newer most recent dosage of medication as prescribed by MD and to follow up with MD office.  RN CM called pt to follow up and make sure no other concerns.  No answer to telephone and no option to leave voicemail.  [name] [facility], [place] Coordinator [phone]

## 2016-07-07 ENCOUNTER — Encounter: Payer: Self-pay | Admitting: Pharmacist

## 2016-07-07 ENCOUNTER — Ambulatory Visit (INDEPENDENT_AMBULATORY_CARE_PROVIDER_SITE_OTHER): Payer: Medicare Other | Admitting: Pharmacist

## 2016-07-07 VITALS — BP 112/62 | HR 68 | Ht 69.0 in | Wt 205.0 lb

## 2016-07-07 DIAGNOSIS — Z794 Long term (current) use of insulin: Secondary | ICD-10-CM | POA: Diagnosis not present

## 2016-07-07 DIAGNOSIS — Z79899 Other long term (current) drug therapy: Secondary | ICD-10-CM

## 2016-07-07 DIAGNOSIS — Z0000 Encounter for general adult medical examination without abnormal findings: Secondary | ICD-10-CM

## 2016-07-07 DIAGNOSIS — E118 Type 2 diabetes mellitus with unspecified complications: Secondary | ICD-10-CM

## 2016-07-07 MED ORDER — METFORMIN HCL ER 500 MG PO TB24: 500.0000 mg | ORAL_TABLET | Freq: Every day | ORAL | 1 refills | Status: DC

## 2016-07-07 NOTE — Patient Instructions (Signed)
  Brad Mendoza , Thank you for taking time to come for your Medicare Wellness Visit. I appreciate your ongoing commitment to your health goals. Please review the following plan we discussed and let me know if I can assist you in the future.   These are the goals we discussed:  Increase Toujeo to 40 units twice a day   This is a list of the screening recommended for you and due dates:  Health Maintenance  Topic Date Due  . Eye exam for diabetics  02/21/2015  . Flu Shot  09/21/2016  . Urine Protein Check  10/22/2016  . Hemoglobin A1C  12/01/2016  . Complete foot exam   03/28/2017  . Pneumococcal vaccine (2) 11/09/2017  . Tetanus Vaccine  07/27/2022  . Colon Cancer Screening  08/02/2023  .  Hepatitis C: One time screening is recommended by Center for Disease Control  (CDC) for  adults born from [birth year] through 1964.   Completed  . HIV Screening  Completed

## 2016-07-08 NOTE — Progress Notes (Signed)
Subjective:   Brad Mendoza is a 61 y.o. male who presents for a subsequent Medicare Annual Wellness Visit. Brad Mendoza was hospitalized 06/01/16 for CHF.  At discharge and at [facility] follow up there were several medication changes made.  Today he brings in all his medications and would like to review correct administration.   He also is concerned about his BG.  His most recent A1c was elevated at 10.1%.  Though not at goal it is lower than previous A1c from 03/15/2016 which was over 14%. He did not bring in glucometer today but he self reports that BG has ranged from 54 to 300.  He states the 54 occurred when he too Tresiba 70 units qd instead of 35 units bid.  He has only had one low BG.  Rest of BG readings have bee between 150 and 300.   Currently he is taking Toujeo 35 units bid, Humalog 3 to 4 units at bedtime as needed for elevated BG.  He is suppose take metformin 1000mg  bid but he admits that he only take 1/2 tablet 2 or 3 times per week because it has caused loose stools in the past.   Social History: Separated for 2 years.  Lives by himself.  Has 2 children and 7 step children.  Patient is disabled and not working.  He enjoys gardening.  Has decreased alcohol intake but continues to have 2-3 drinks per week.  He does not smoke but he uses snuff / dip daily.    Current Medications (verified) Outpatient Encounter Prescriptions as of 07/07/2016  Medication Sig  . albuterol (PROVENTIL HFA;VENTOLIN HFA) 108 (90 Base) MCG/ACT inhaler Inhale 2 puffs into the lungs every 6 (six) hours as needed for wheezing or shortness of breath.  [name] aspirin EC 81 MG tablet Take 81 mg by mouth daily.  . Capsaicin (ICY HOT ARTHRITIS THERAPY EX) Apply 1 application topically 2 (two) times daily as needed (back pain).   . carvedilol (COREG) 12.5 MG tablet TAKE ONE TABLET TWICE A DAY WITH FOOD  . fluticasone (FLONASE) 50 MCG/ACT nasal spray USE 2 SPRAYS IN EACH NOSTRIL DAILY  . furosemide (LASIX)  20 MG tablet Take 2-3 tablets (40-60 mg total) by mouth as directed. 60 mg (3 tabs) in the AM and 40 mg (2 tabs) in the PM  . Insulin Glargine (TOUJEO SOLOSTAR) 300 UNIT/ML SOPN Inject 70-90 Units into the skin daily. (Patient taking differently: Inject 70 Units into the skin daily. )  . insulin lispro (HUMALOG) 100 UNIT/ML injection Inject 3 Units into the skin at bedtime.  . isosorbide mononitrate (IMDUR) 30 MG 24 hr tablet Take 3 tablets (90 mg total) by mouth daily.  . Multiple Vitamins-Minerals (CENTRUM SILVER ADULT 50+ PO) Take 1 tablet by mouth daily.  . nitroGLYCERIN (NITROSTAT) 0.4 MG SL tablet DISSOLVE 1 TAB UNDER TOUNGE FOR CHEST PAIN. MAY REPEAT EVERY 5 MINUTES FOR 3 DOSES. IF NO RELIEF CALL 911 OR GO TO ER  . Omega-3 Fatty Acids (FISH OIL) 1000 MG CPDR Take 1 capsule by mouth daily.  . pantoprazole (PROTONIX) 40 MG tablet TAKE ONE (1) TABLET EACH DAY  . ranolazine (RANEXA) 500 MG 12 hr tablet Take 1 tablet (500 mg total) by mouth 2 (two) times daily.  . rosuvastatin (CRESTOR) 10 MG tablet TAKE ONE (1) TABLET EACH DAY  . sacubitril-valsartan (ENTRESTO) 49-51 MG Take 1 tablet by mouth 2 (two) times daily.  [name] spironolactone (ALDACTONE) 25 MG tablet TAKE 1/2 TABLET DAILY  .  terbinafine (LAMISIL) 1 % cream Apply 1 application topically 2 (two) times daily. Apply to both feet and between toes  . ULTICARE MINI PEN NEEDLES 31G X 6 MM MISC 4 TIMES DAILY  . metFORMIN (GLUCOPHAGE-XR) 500 MG 24 hr tablet Take 1 tablet (500 mg total) by mouth daily with breakfast.  . [DISCONTINUED] gabapentin (NEURONTIN) 300 MG capsule Take 1 capsule (300 mg total) by mouth at bedtime. (Patient not taking: Reported on 07/07/2016)  .  metFORMIN (GLUCOPHAGE) 500 MG tablet Take 1 tablet (500 mg total) by mouth 2 (two) times daily with a meal. (Patient not taking: Reported on 07/07/2016)   Patient brings in bottle of medications from home.  He has 2 different strengths of furosemide 20mg  and 40mg . He reports that he has  been taking furosemide from both.  He also has 2 bottle of isosorbide 30mg  and 60mg  and has been taking medication from both.    No facility-administered encounter medications on file as of 07/07/2016.     Allergies (verified) Sulfa antibiotics and Penicillins   History: Past Medical History:  Diagnosis Date  . Acid reflux   . Back pain    f4 and f5  . CAD (coronary artery disease)   . CHF (congestive heart failure) ([place])    last echo was in March 2016  . Diabetes mellitus 2005  . Diverticulosis   . H/O chest pain 2005   ECHO  . HTN (hypertension)   . Hyperlipidemia   . Nonischemic cardiomyopathy ([place])   . Onychomycosis   . Polysubstance abuse    Past Surgical History:  Procedure Laterality Date  . APPENDECTOMY    . CARDIAC CATHETERIZATION  2005   4 frech cath  . COLONOSCOPY N/A 08/01/2013   Procedure: COLONOSCOPY;  Surgeon: Rogene Houston, MD;  [facility];  Service: Endoscopy;  Laterality: N/A;  rescheduled to [place] notified pt  . ESOPHAGOGASTRODUODENOSCOPY N/A 04/23/2015   Procedure: ESOPHAGOGASTRODUODENOSCOPY (EGD);  Surgeon: Rogene Houston, MD;  Location: [facility];  Service: Endoscopy;  Laterality: N/A;  1:25  . RIGHT/LEFT HEART CATH AND CORONARY ANGIOGRAPHY N/A 06/03/2016   Procedure: Right/Left Heart Cath and Coronary Angiography;  Surgeon: Belva Crome, MD;  Location: [place] [facility];  Service: Cardiovascular;  Laterality: N/A;   Family History  Problem Relation Age of Onset  . Hypertension Father   . Diabetes Father   . Cancer Mother   . Alcohol abuse Brother   . Colon cancer Neg Hx    Social History   Occupational History  . disabled    Social History Main Topics  . Smoking status: Never Smoker  . Smokeless tobacco: Current User    Types: Snuff     Comment: dips snuff about 6 cans/week for 3 years  . Alcohol use 0.6 oz/week    1 Cans of beer per week     Comment: occ - has cut down, now once every 2-3 months  . Drug use: Yes     Types: Marijuana     Comment: occasional use - smoking less  . Sexual activity: Yes    Do you feel safe at home?  Yes Are there smokers in your home (other than you)? No  Dietary issues and exercise activities discussed: Current Exercise Habits: Home exercise routine, Type of exercise: walking, Time (Minutes): 40, Frequency (Times/Week): 6, Weekly Exercise (Minutes/Week): 240, Intensity: Moderate  Current Dietary habits:  Limiting sodium intake.  Reports eating a lot of chicken recently  and is reducing his bread intake (he use to have bread at every meal)   Cardiac Risk Factors include: advanced age (>48men, >75 women);diabetes mellitus;dyslipidemia;hypertension;male gender;obesity (BMI >30kg/m2);smoking/ tobacco exposure;microalbuminuria  Objective:    Today's Vitals   07/07/16 1556  BP: 112/62  Pulse: 68  Weight: 205 lb (93 kg)  Height: 5\' 9"  (1.753 m)  PainSc: 2   PainLoc: Knee   Body mass index is 30.27 kg/m.   Activities of Daily Living In your present state of health, do you have any difficulty performing the following activities: 07/07/2016 06/01/2016  Hearing? N N  Vision? N Y  Difficulty concentrating or making decisions? N N  Walking or climbing stairs? N N  Dressing or bathing? N N  Doing errands, shopping? N N  Preparing Food and eating ? N -  Using the Toilet? N -  In the past six months, have you accidently leaked urine? N -  Do you have problems with loss of bowel control? N -  Managing your Medications? N -  Managing your Finances? N -  Housekeeping or managing your Housekeeping? N -  Some recent data might be hidden     Depression Screen PHQ 2/9 Scores 07/07/2016 06/01/2016 05/30/2016 03/15/2016  PHQ - 2 Score 0 0 0 0     Fall Risk Fall Risk  07/07/2016 05/30/2016 03/04/2016 12/08/2015 12/04/2015  Falls in the past year? No No No No No  Risk for fall due to : - - Medication side effect - Medication side effect    Cognitive Function: MMSE - Mini  Mental State Exam 07/07/2016 05/14/2014  Orientation to time 5 5  Orientation to Place 5 5  Registration 3 3  Attention/ Calculation 2 1  Recall 3 2  Language- name 2 objects 2 2  Language- repeat 1 1  Language- follow 3 step command 3 3  Language- read & follow direction 0 0  Language-read & follow direction-comments - patient cannot read or write well   Write a sentence 0 0  Copy design 1 1  Total score 25 23    Immunizations and Health Maintenance Immunization History  Administered Date(s) Administered  . Influenza,inj,Quad PF,36+ Mos 05/09/2015, 11/17/2015  . Pneumococcal Conjugate-13 05/14/2014  . Pneumococcal Polysaccharide-23 11/09/2012, 11/17/2015  . Tdap 07/26/2012   Health Maintenance Due  Topic Date Due  . OPHTHALMOLOGY EXAM  02/21/2015    Patient Care Team: [name], MD as PCP - General (Pediatrics) Harlen Labs, MD as Referring Physician (Optometry) Herminio Commons, MD as Attending Physician (Cardiology) Kassie Mends, RN as [place] any recent Medical Services you may have received from other than [facility] providers in the past year (date may be approximate).    Assessment:    Annual Wellness Visit  Medication management Uncontrolled DM   Screening Tests Health Maintenance  Topic Date Due  . OPHTHALMOLOGY EXAM  02/21/2015  . INFLUENZA VACCINE  09/21/2016  . URINE MICROALBUMIN  10/22/2016  . HEMOGLOBIN A1C  12/01/2016  . FOOT EXAM  03/28/2017  . PNEUMOCOCCAL POLYSACCHARIDE VACCINE (2) 11/09/2017  . TETANUS/TDAP  07/27/2022  . COLONOSCOPY  08/02/2023  . Hepatitis C Screening  Completed  . HIV Screening  Completed        Plan:   During the course of the visit Ch[place]wuemeka was educated and counseled about the following appropriate screening and preventive services:   Reviewed all medications and removed duplicate medications (furosemide 40mg  and Isosorbide  60mg ) .  Will send updated medication  list to his pharmacy to ensure that no discontinued medications are filled through auto refill program  Made printout with meds and when to take for patient - reviewed with him.  He was able to match up names of medications and what time of time to take them.   Vaccines to include Pneumoccal, Influenza,  Td, and Shingles - UTD on vaccines except Shingles vaccine but he declined today  Colorectal cancer screening - done 2015  Cardiovascular disease screening - UTD  Increase toujeo to 40 units once a day  Start metformin XR 500mg  take 1 tablet daily  Continuous Glucose monitor was placed today to try to get better understanding of BG trends.  Patient educated about proper care of CGM and site.  CGM was placed on back of upper left arm.  Patient will wear at least 5 days and up to 14 days.  He is to keep BG, diet and insulin administration records. He can engage in regular activities with special care when showering, changing clothes and swimming (only up to 3 feet and for 30 minutes at a time)  Glaucoma screening / Diabetic Eye Exam - patient is aware that he needs appt.  He is saving up for copay and will make appt with Dr Marin Comment  Nutrition counseling - continue to limit CHO and sodium intake  Smoking / Tobacco cessation counseling - pt declined  Advanced Directives - declined  Physical Activity - continue to walk daily  Appt with PCP July 2018      Patient Instructions (the written plan) were given to the patient.   Cherre Robins, PharmD   07/08/2016

## 2016-07-19 ENCOUNTER — Other Ambulatory Visit (HOSPITAL_COMMUNITY): Payer: Self-pay | Admitting: *Deleted

## 2016-07-19 MED ORDER — RANOLAZINE ER 500 MG PO TB12
500.0000 mg | ORAL_TABLET | Freq: Two times a day (BID) | ORAL | 6 refills | Status: DC
Start: 2016-07-19 — End: 2017-02-22

## 2016-07-21 ENCOUNTER — Encounter: Payer: Self-pay | Admitting: Pharmacist

## 2016-07-21 ENCOUNTER — Ambulatory Visit (INDEPENDENT_AMBULATORY_CARE_PROVIDER_SITE_OTHER): Payer: Medicare Other | Admitting: Pharmacist

## 2016-07-21 VITALS — BP 134/78 | HR 70 | Ht 69.0 in | Wt 211.0 lb

## 2016-07-21 DIAGNOSIS — Z794 Long term (current) use of insulin: Secondary | ICD-10-CM

## 2016-07-21 DIAGNOSIS — E118 Type 2 diabetes mellitus with unspecified complications: Secondary | ICD-10-CM

## 2016-07-21 DIAGNOSIS — I2510 Atherosclerotic heart disease of native coronary artery without angina pectoris: Secondary | ICD-10-CM | POA: Diagnosis not present

## 2016-07-21 MED ORDER — SITAGLIPTIN PHOSPHATE 100 MG PO TABS: 100.0000 mg | ORAL_TABLET | Freq: Every day | ORAL | 1 refills | Status: DC

## 2016-07-21 NOTE — Progress Notes (Signed)
Patient ID: Brad Mendoza, male   DOB: 1955/11/18, 61 y.o.   MRN: [id number]   Subjective:    Brad Mendoza is a 61 y.o. male who presents for an follow up  evaluation of Type 2 diabetes mellitus.  At out last visit we place a professional CGM to better understand BG trends. CGM captured 12 days of data.  Current symptoms/problems include hyperglycemia, hypoglycemia  and visual disturbances.  Current diabetic medications include Toujeo 40 unit bid (did not switch to qd) and metformin XR 500mg  qd - patient is not taking regularly - only 1-2 times per week becuase of diarrhea. [name]    CGM report (12 days of data) Average BG - 157 29% of BG was in target (70 to 180) 14% of BG was below target (less than 70) 57% of BG was above target (over 180) 9 incidences of hypoglycemia which occurred mostly during night or pre dinner meal Hyperglycemia mostly after lunch and evening meal.  Current monitoring regimen: home blood tests - 1-2 times daily Home blood sugar records: pt did not bring in glucometer today Any episodes of hypoglycemia? yes  Known diabetic complications: peripheral neuropathy and cardiovascular disease Cardiovascular risk factors: advanced age (older than 51 for men, 54 for women), diabetes mellitus, dyslipidemia, hypertension and male gender Eye exam current (within one year): no Weight trend: stable Prior visit with CDE: yes -   Current diet: in general, a "healthy" diet   - eating lots more lean proteins and non starchy vegetables since visit 2 weeks ago. Current exercise: walking Medication Compliance?  No   Is He on ACE inhibitor or angiotensin II receptor blocker?  No - lisinopril stopped during last hospitalization   Objective:    BP 134/78   Pulse 70   Ht 5\' 9"  (1.753 m)   Wt 211 lb (95.7 kg)   BMI 31.16 kg/m   Lab Review Glucose, Bld (mg/dL)  Date Value  06/30/2016 233 (H)  06/08/2016 153 (H)  06/07/2016 97   CO2 (mmol/L)  Date Value  06/30/2016 24   06/08/2016 27  06/07/2016 27   Creatinine, Ser (mg/dL)  Date Value  06/30/2016 1.43 (H)  06/08/2016 1.38 (H)  06/07/2016 1.30 (H)      Assessment:    Diabetes Mellitus type II, under inadequate control.   CGM indicating variable BG with both hypoglycemic and hyperglycemic events.  Plan:    1.  Rx changes: add Januvia 100mg  qd.  Decrease Toujeo to 35 units bid.  Continue metfomrin XR 500mg  qd 2.  Education: Reviewed 'ABCs' of diabetes management (respective goals in parentheses):  A1C (<7), blood pressure (<130/80), and cholesterol (LDL <100). 3. Reviewed CHO counting diet.  5.  Recommend check BG 1-2 times a day 6.  Recommended continue physical activity - goal is 150 minutes per week 7. Follow up: 4 weeks  with PCP and 3 - 6 months with me.

## 2016-07-28 ENCOUNTER — Inpatient Hospital Stay (HOSPITAL_COMMUNITY): Admission: RE | Admit: 2016-07-28 | Payer: Medicare Other | Source: Ambulatory Visit

## 2016-08-17 ENCOUNTER — Telehealth: Payer: Self-pay | Admitting: Pediatrics

## 2016-08-17 DIAGNOSIS — E1142 Type 2 diabetes mellitus with diabetic polyneuropathy: Secondary | ICD-10-CM

## 2016-08-17 DIAGNOSIS — I5043 Acute on chronic combined systolic (congestive) and diastolic (congestive) heart failure: Secondary | ICD-10-CM

## 2016-08-17 DIAGNOSIS — Z794 Long term (current) use of insulin: Secondary | ICD-10-CM

## 2016-08-17 NOTE — Telephone Encounter (Signed)
Patient called with questions about appointments. I reviewed up coming appts and at patients request moved up appt with Dr Evette Doffing.   Patient also asked about [facility] nurse coming out again.  Will resubmit referral to [facility].

## 2016-08-19 ENCOUNTER — Other Ambulatory Visit: Payer: Self-pay | Admitting: Pharmacist

## 2016-08-19 NOTE — Patient Outreach (Signed)
[facility]) Care Management  08/19/2016  Brad Mendoza March 23, 1955 [id number]   Patient was called regarding medication management per referral from Cherre Robins, PharmD at [place]. HIPAA identifiers were obtained.  Patient said he would prefer to review his medications in person.   Plan:  Home visit scheduled for 08/23/16   Elayne Guerin, PharmD, [place] Clinical Pharmacist [phone]

## 2016-08-22 ENCOUNTER — Other Ambulatory Visit: Payer: Self-pay | Admitting: *Deleted

## 2016-08-23 ENCOUNTER — Telehealth: Payer: Self-pay

## 2016-08-23 ENCOUNTER — Other Ambulatory Visit: Payer: Self-pay | Admitting: Pharmacist

## 2016-08-23 ENCOUNTER — Encounter: Payer: Self-pay | Admitting: Pharmacist

## 2016-08-23 ENCOUNTER — Other Ambulatory Visit: Payer: Self-pay | Admitting: Family Medicine

## 2016-08-23 NOTE — Patient Outreach (Signed)
[facility]) Care Management 08/22/16  Brad Mendoza [birth year] [id number]  Telephone Screen  Referral Date: 08/18/16 Referral Source: MD referral Referral Reason: Disease state management and medication management Insurance: Medicare, Medicaid  Outreach attempt # 1 spoke with patient regarding MD referral. HIPAA verified with patient.  Social:  Patient lives alone. He is independent with his ADLs. He depends on his daughter for transportation to all medical appointments.  Conditions: Past Medical Hx: DM, CHF, HTN, CKD stage 2, HLD Patient was recently admitted to [facility] from 06/01/16 to 06/08/16 for angina pain. He verbalized having medication changes, which he "believes is causing blisters on his body and head". He voiced concerned related to the blisters. He has requested for a [facility] pharmacist to visit his home to review his medications. Per MD documentation, patient is not taking medications as prescribed. He is selective on medications that he should take daily, per [facility] pharmacist. Patient believes his medications were changed by the MD due to heart surgery being too risky for him.  Patient could benefit from education with medical management, per MD notes and [facility] pharmacist.  Medications: Patient reported greater taking 10 meds per day. Patient reported being able to afford his medications and taking them as prescribed. Patient described "small blisters appearing on his head and arms, which may be related to his medication".  Appointments: Patient stated he missed several MD appointments with his PCP and Nephrologist.   Advanced Directives: Pt reported not having an Advanced Directive. He requested  nformation about Advanced Directives.   Consent: [facility] services reviewed and discussed with spouse. He agreed to services.   Plan: RN CM advised patient that [facility] community RN CM would follow up and contact patient within the next 10 days RN CM will send SW referral  for possible placement and/or community resources.  RN CM advised patient to contact RNCM for any needs or concerns. RN CM provided patient with 24 hour Nurse Advice Line number.   Lake Bells, RN, BSN, MHA/MSL, [place] Telephonic Care Manager Coordinator [facility] Direct Phone: [phone] Toll Free: [phone] Fax: [phone]

## 2016-08-23 NOTE — Telephone Encounter (Signed)
Insurance denied Januvia   Must try Onglyza or Tradjenta

## 2016-08-23 NOTE — Patient Outreach (Signed)
[facility]) Care Management  [place]   08/23/2016  Brad Mendoza 08-24-1955 [id number]  Subjective: Home visit completed at patient's home.  HIPAA identifiers were obtained.  Patient was referred for medication management by his PCP.  Patient is a 61 year old male with multiple medical conditions including but not limited to:  CHF, hyperlipidemia, hypertension, diabetes and CAD.    Patient manages his own medications but does not use a pill box.  He prefers to use the actual bottles versus pill packs or a pill box. In addition, the patient shared he does not read well.  Objective:   Encounter Medications: Outpatient Encounter Prescriptions as of 08/23/2016  Medication Sig Note  . albuterol (PROVENTIL HFA;VENTOLIN HFA) 108 (90 Base) MCG/ACT inhaler Inhale 2 puffs into the lungs every 6 (six) hours as needed for wheezing or shortness of breath. 02/29/2016: Uses PRN  . amLODipine (NORVASC) 10 MG tablet Take 5 mg by mouth daily.   . Capsaicin (ICY HOT ARTHRITIS THERAPY EX) Apply 1 application topically 2 (two) times daily as needed (back pain).    . carvedilol (COREG) 12.5 MG tablet TAKE ONE TABLET TWICE A DAY WITH FOOD   . fluticasone (FLONASE) 50 MCG/ACT nasal spray USE 2 SPRAYS IN EACH NOSTRIL DAILY   . furosemide (LASIX) 20 MG tablet Take 2-3 tablets (40-60 mg total) by mouth as directed. 60 mg (3 tabs) in the AM and 40 mg (2 tabs) in the PM   . Insulin Glargine (TOUJEO SOLOSTAR) 300 UNIT/ML SOPN Inject 70-90 Units into the skin daily. (Patient taking differently: Inject 35 Units into the skin daily. )   . insulin lispro (HUMALOG) 100 UNIT/ML injection Inject 3 Units into the skin at bedtime.   . isosorbide mononitrate (IMDUR) 30 MG 24 hr tablet Take 3 tablets (90 mg total) by mouth daily.   . Multiple Vitamins-Minerals (CENTRUM SILVER ADULT 50+ PO) Take 1 tablet by mouth daily.   . nitroGLYCERIN (NITROSTAT) 0.4 MG SL tablet DISSOLVE 1 TAB UNDER TOUNGE FOR CHEST  PAIN. MAY REPEAT EVERY 5 MINUTES FOR 3 DOSES. IF NO RELIEF CALL 911 OR GO TO ER   . Omega-3 Fatty Acids (FISH OIL) 1000 MG CPDR Take 1 capsule by mouth daily.   . pantoprazole (PROTONIX) 40 MG tablet TAKE ONE (1) TABLET EACH DAY   . ranolazine (RANEXA) 500 MG 12 hr tablet Take 1 tablet (500 mg total) by mouth 2 (two) times daily.   . rosuvastatin (CRESTOR) 10 MG tablet TAKE ONE (1) TABLET EACH DAY   . sacubitril-valsartan (ENTRESTO) 49-51 MG Take 1 tablet by mouth 2 (two) times daily.   . sitaGLIPtin (JANUVIA) 100 MG tablet Take 1 tablet (100 mg total) by mouth daily.   [name] spironolactone (ALDACTONE) 25 MG tablet TAKE 1/2 TABLET DAILY   . terbinafine (LAMISIL) 1 % cream Apply 1 application topically 2 (two) times daily. Apply to both feet and between toes   . ULTICARE MINI PEN NEEDLES 31G X 6 MM MISC 4 TIMES DAILY   . aspirin EC 81 MG tablet Take 81 mg by mouth daily.   . metFORMIN (GLUCOPHAGE-XR) 500 MG 24 hr tablet Take 1 tablet (500 mg total) by mouth daily with breakfast. (Patient not taking: Reported on 07/21/2016) 08/23/2016: Not taking due to it causing diarrhea.   No facility-administered encounter medications on file as of 08/23/2016.     Functional Status: In your present state of health, do you have any difficulty performing the following  activities: 07/07/2016 06/01/2016  Hearing? N N  Vision? N Y  Difficulty concentrating or making decisions? N N  Walking or climbing stairs? N N  Dressing or bathing? N N  Doing errands, shopping? N N  Preparing Food and eating ? N -  Using the Toilet? N -  In the past six months, have you accidently leaked urine? N -  Do you have problems with loss of bowel control? N -  Managing your Medications? N -  Managing your Finances? N -  Housekeeping or managing your Housekeeping? N -  Some recent data might be hidden    Fall/Depression Screening: Fall Risk  07/07/2016 05/30/2016 03/04/2016  Falls in the past year? No No No  Risk for fall due to : - -  Medication side effect   PHQ 2/9 Scores 08/22/2016 07/07/2016 06/01/2016 05/30/2016 03/15/2016 01/28/2016 12/08/2015  PHQ - 2 Score 0 0 0 0 0 0 0      Assessment: Patient's medications were reviewed during the home visit.   Drugs sorted by system:  Neurologic/Psychologic:   Cardiovascular: Carvedilol Furosemide Isosorbide Mononitrate Nitroglycerin Omega 3 Fatty Acids Ranexa Rosuvastatin Entresto Spironolactone Aspirin  Pulmonary/Allergy: Ventolin HFA Fluticasone  Gastrointestinal: Pantoprazole  Endocrine: Toujeo/Lantus--(samples) Humalog Januvia Metformu  Topical: Terbinafine  Vitamins/Minerals: Multiple Vitamins   Medication Review Findings:  1.  Amlodipine-patient was still taking although it was discontinued at discharge--he was educated to stop and it was separated from his medications.  2.   Januvia--is non formulary .  Tradjenta or Onglyza are formulary.  Ryan at [employer] said they sent a request for a PA but had not heard back.  Switching to what's covered would probably be what's easier.  Patient has some Januvia samples but reported he does not take it daily.--He was counseled on taking his medications daily as prescribed.  3.  90 day supplies--it would save the patient money to get all of his medications for a 90 day supply--Patient's PCP will be asked to send 90 day supplies of all his chronic medications  4.  Insulin Adherence- Lantus/Toujeo.  Patient reported only giving himself 35 units daily.  Review of his blood glucose meter revealed a 30 day average blood sugar of 215 mg/dl --Patient said when he actually gave himself 70 units daily, he was having lows.  Two lows were in his meter from 2-3 weeks ago (32 and 63).  He will need a new prescription for Lantus as well for a 90 day supply:)  5.  Humalog dose-Humalog dose on the medication list in the patient's chart instructs the patient to take 3 units at bedtime. To decrease the risk of  hypoglycemia, Humalog should probably be dosed at the largest meal.  Patient reported he does not routinely use Humalog.  6.  Metformin-patient reported he does not take metformin due to it causing diarrhea.  Plan/Interventions Note sent to PCP office [facility] was called The patient's community pharmacy ([facility]) was called Will follow up with the patient in 5-7 days. Consulted with [facility] Telephonic Nurse, [place] Bells Patient will need to be followed by [place] Management.   Elayne Guerin, PharmD, [place] Clinical Pharmacist [phone]

## 2016-08-26 ENCOUNTER — Other Ambulatory Visit: Payer: Self-pay | Admitting: *Deleted

## 2016-08-26 ENCOUNTER — Ambulatory Visit (HOSPITAL_COMMUNITY)
Admission: RE | Admit: 2016-08-26 | Discharge: 2016-08-26 | Disposition: A | Discharge status: Home / Self Care | Payer: Medicare Other | Source: Ambulatory Visit | Attending: Cardiology | Admitting: Cardiology

## 2016-08-26 ENCOUNTER — Encounter (HOSPITAL_COMMUNITY): Payer: Self-pay

## 2016-08-26 ENCOUNTER — Encounter (INDEPENDENT_AMBULATORY_CARE_PROVIDER_SITE_OTHER): Payer: Self-pay | Admitting: Internal Medicine

## 2016-08-26 VITALS — BP 136/82 | HR 79 | Wt 216.0 lb

## 2016-08-26 DIAGNOSIS — I25119 Atherosclerotic heart disease of native coronary artery with unspecified angina pectoris: Secondary | ICD-10-CM | POA: Diagnosis not present

## 2016-08-26 DIAGNOSIS — I429 Cardiomyopathy, unspecified: Secondary | ICD-10-CM | POA: Diagnosis not present

## 2016-08-26 DIAGNOSIS — I5042 Chronic combined systolic (congestive) and diastolic (congestive) heart failure: Secondary | ICD-10-CM | POA: Diagnosis not present

## 2016-08-26 DIAGNOSIS — Z794 Long term (current) use of insulin: Secondary | ICD-10-CM | POA: Diagnosis not present

## 2016-08-26 DIAGNOSIS — I5022 Chronic systolic (congestive) heart failure: Secondary | ICD-10-CM | POA: Diagnosis not present

## 2016-08-26 DIAGNOSIS — Z8249 Family history of ischemic heart disease and other diseases of the circulatory system: Secondary | ICD-10-CM | POA: Diagnosis not present

## 2016-08-26 DIAGNOSIS — Z7982 Long term (current) use of aspirin: Secondary | ICD-10-CM | POA: Diagnosis not present

## 2016-08-26 DIAGNOSIS — Z809 Family history of malignant neoplasm, unspecified: Secondary | ICD-10-CM | POA: Insufficient documentation

## 2016-08-26 DIAGNOSIS — Z811 Family history of alcohol abuse and dependence: Secondary | ICD-10-CM | POA: Diagnosis not present

## 2016-08-26 DIAGNOSIS — F1010 Alcohol abuse, uncomplicated: Secondary | ICD-10-CM | POA: Diagnosis not present

## 2016-08-26 DIAGNOSIS — I428 Other cardiomyopathies: Secondary | ICD-10-CM

## 2016-08-26 DIAGNOSIS — Z720 Tobacco use: Secondary | ICD-10-CM | POA: Insufficient documentation

## 2016-08-26 DIAGNOSIS — Z882 Allergy status to sulfonamides status: Secondary | ICD-10-CM | POA: Diagnosis not present

## 2016-08-26 DIAGNOSIS — Z79899 Other long term (current) drug therapy: Secondary | ICD-10-CM | POA: Insufficient documentation

## 2016-08-26 DIAGNOSIS — Z880 Allergy status to penicillin: Secondary | ICD-10-CM | POA: Insufficient documentation

## 2016-08-26 DIAGNOSIS — I130 Hypertensive heart and chronic kidney disease with heart failure and stage 1 through stage 4 chronic kidney disease, or unspecified chronic kidney disease: Secondary | ICD-10-CM | POA: Insufficient documentation

## 2016-08-26 DIAGNOSIS — Z833 Family history of diabetes mellitus: Secondary | ICD-10-CM | POA: Diagnosis not present

## 2016-08-26 DIAGNOSIS — I5043 Acute on chronic combined systolic (congestive) and diastolic (congestive) heart failure: Secondary | ICD-10-CM

## 2016-08-26 DIAGNOSIS — I10 Essential (primary) hypertension: Secondary | ICD-10-CM

## 2016-08-26 DIAGNOSIS — E1122 Type 2 diabetes mellitus with diabetic chronic kidney disease: Secondary | ICD-10-CM | POA: Diagnosis not present

## 2016-08-26 DIAGNOSIS — I2510 Atherosclerotic heart disease of native coronary artery without angina pectoris: Secondary | ICD-10-CM | POA: Diagnosis not present

## 2016-08-26 DIAGNOSIS — N183 Chronic kidney disease, stage 3 (moderate): Secondary | ICD-10-CM | POA: Insufficient documentation

## 2016-08-26 LAB — BASIC METABOLIC PANEL
Anion gap: 5 (ref 5–15)
BUN: 45 mg/dL — AB (ref 6–20)
CHLORIDE: 108 mmol/L (ref 101–111)
CO2: 24 mmol/L (ref 22–32)
CREATININE: 1.46 mg/dL — AB (ref 0.61–1.24)
Calcium: 8.6 mg/dL — ABNORMAL LOW (ref 8.9–10.3)
GFR calc Af Amer: 59 mL/min — ABNORMAL LOW (ref >60)
GFR calc non Af Amer: 51 mL/min — ABNORMAL LOW (ref >60)
GLUCOSE: 258 mg/dL — AB (ref 65–99)
Potassium: 5.3 mmol/L — ABNORMAL HIGH (ref 3.5–5.1)
SODIUM: 137 mmol/L (ref 135–145)

## 2016-08-26 MED ORDER — SACUBITRIL-VALSARTAN 97-103 MG PO TABS: 1.0000 | ORAL_TABLET | Freq: Two times a day (BID) | ORAL | 3 refills | Status: DC

## 2016-08-26 NOTE — Patient Instructions (Addendum)
TAKE  Lasix 60mg  twice daily today and tomorrow ONLY. Then cut back to your regular dose of Lasix 60 mg (3 tabs) in the AM and 40 mg (2 tabs) in the PM.  INCREASE Entresto to 97/103mg  twice daily.   Routine lab work today. Will notify you of abnormal results  REPEAT Labs in 10 days (bmet)  Follow up in 3 months

## 2016-08-26 NOTE — Progress Notes (Signed)
Advanced Heart Failure Clinic Note   Primary Care: Dr. Alain Honey Primary Cardiologist: New, Dr. Aundra Dubin   HPI: Brad Mendoza is a 61 year old male with a past medical history of chronic combined systolic and diastolic CHF last EF 78-46% (Dec. 2017), HTN, HLD, DM and CAD. Also with history of NICM, left heart cath in June 2005 with 40-50% mid LAD stenosis, EF 45% at that time.    Admitted on 06/01/16 with chest pain relieved with nitroglycerin and volume overload. Troponin negative. Admission weight was 230 pounds. Right and left heart cath on 06/03/16 with 70-75% stenosis in the proximal and mid LAD,90% ostial stenosis in the 1st diagonal, and  60-70% stenosis in the mid RCA. RHC showed Fick C.O. 5.86/index 2.74. PA mean 33 mm Hg, LVEDP 25 mm Hg. EF by cath appears to be <25%. Medical therapy was recommended in the absence of ACS, and PCI was felt to be high risk with severe cardiomyopathy. CVTS was consulted and felt that CABG would not benefit him as he had NICM prior to his CAD. Discharge weight 207 pounds.   He returns today for HF follow up. Overall feeling well, denies SOB with working outside. No SOB walking in the grocery store. He denies orthopnea and PND. He is taking all of his medications with compliance. Weight up 7 pounds from last visit. He had pork chops on the grill 2 days ago, eating other high salt foods. Drinking more than 2L a day. He drinks about 3-4 beers a week. He did drink beer yesterday.    Past Medical History:  Diagnosis Date  . Acid reflux   . Back pain    f4 and f5  . CAD (coronary artery disease)   . CHF (congestive heart failure) ([name])    last echo was in March 2016  . Diabetes mellitus 2005  . Diverticulosis   . H/O chest pain 2005   ECHO  . HTN (hypertension)   . Hyperlipidemia   . Nonischemic cardiomyopathy ([place])   . Onychomycosis   . Polysubstance abuse     Current Outpatient Prescriptions  Medication Sig Dispense Refill  . albuterol (PROVENTIL  HFA;VENTOLIN HFA) 108 (90 Base) MCG/ACT inhaler Inhale 2 puffs into the lungs every 6 (six) hours as needed for wheezing or shortness of breath. 1 Inhaler 2  . aspirin EC 81 MG tablet Take 81 mg by mouth daily.    . Capsaicin (ICY HOT ARTHRITIS THERAPY EX) Apply 1 application topically 2 (two) times daily as needed (back pain).     . carvedilol (COREG) 12.5 MG tablet TAKE ONE TABLET TWICE A DAY WITH FOOD 180 tablet 0  . furosemide (LASIX) 20 MG tablet Take 2-3 tablets (40-60 mg total) by mouth as directed. 60 mg (3 tabs) in the AM and 40 mg (2 tabs) in the PM 150 tablet 3  . insulin lispro (HUMALOG) 100 UNIT/ML injection Inject 3 Units into the skin at bedtime.    . isosorbide mononitrate (IMDUR) 30 MG 24 hr tablet Take 3 tablets (90 mg total) by mouth daily. 90 tablet 0  . metFORMIN (GLUCOPHAGE-XR) 500 MG 24 hr tablet Take 1 tablet (500 mg total) by mouth daily with breakfast. 30 tablet 1  . Multiple Vitamins-Minerals (CENTRUM SILVER ADULT 50+ PO) Take 1 tablet by mouth daily.    . Omega-3 Fatty Acids (FISH OIL) 1000 MG CPDR Take 1 capsule by mouth daily.    . pantoprazole (PROTONIX) 40 MG tablet TAKE ONE (1) TABLET EACH  DAY 90 tablet 0  . ranolazine (RANEXA) 500 MG 12 hr tablet Take 1 tablet (500 mg total) by mouth 2 (two) times daily. 60 tablet 6  . rosuvastatin (CRESTOR) 10 MG tablet TAKE ONE (1) TABLET EACH DAY 90 tablet 0  . sitaGLIPtin (JANUVIA) 100 MG tablet Take 1 tablet (100 mg total) by mouth daily. 30 tablet 1  . spironolactone (ALDACTONE) 25 MG tablet TAKE 1/2 TABLET DAILY 45 tablet 0  . terbinafine (LAMISIL) 1 % cream Apply 1 application topically 2 (two) times daily. Apply to both feet and between toes 30 g 1  . TOUJEO SOLOSTAR 300 UNIT/ML SOPN INJECT 70-90 UNITS SQ DAILY 9 mL 2  . ULTICARE MINI PEN NEEDLES 31G X 6 MM MISC 4 TIMES DAILY 100 each 1  . fluticasone (FLONASE) 50 MCG/ACT nasal spray USE 2 SPRAYS IN EACH NOSTRIL DAILY (Patient not taking: Reported on 08/26/2016) 48 g 0  .  nitroGLYCERIN (NITROSTAT) 0.4 MG SL tablet DISSOLVE 1 TAB UNDER TOUNGE FOR CHEST PAIN. MAY REPEAT EVERY 5 MINUTES FOR 3 DOSES. IF NO RELIEF CALL 911 OR GO TO ER (Patient not taking: Reported on 08/26/2016) 25 tablet 2  . sacubitril-valsartan (ENTRESTO) 97-103 MG Take 1 tablet by mouth 2 (two) times daily. 180 tablet 3   No current facility-administered medications for this encounter.     Allergies  Allergen Reactions  . Sulfa Antibiotics Shortness Of Breath  . Penicillins Rash    Has patient had a PCN reaction causing immediate rash, facial/tongue/throat swelling, SOB or lightheadedness with hypotension: No Has patient had a PCN reaction causing severe rash involving mucus membranes or skin necrosis: No Has patient had a PCN reaction that required hospitalization No Has patient had a PCN reaction occurring within the last 10 years: No If all of the above answers are "NO", then may proceed with Cephalosporin use.       Social History   Social History  . Marital status: Married    Spouse name: N/A  . Number of children: N/A  . Years of education: N/A   Occupational History  . disabled    Social History Main Topics  . Smoking status: Never Smoker  . Smokeless tobacco: Current User    Types: Snuff     Comment: dips snuff about 6 cans/week for 3 years  . Alcohol use 0.6 oz/week    1 Cans of beer per week     Comment: occ - has cut down, now once every 2-3 months  . Drug use: Yes    Types: Marijuana     Comment: occasional use - smoking less  . Sexual activity: Yes   Other Topics Concern  . Not on file   Social History Narrative  . No narrative on file      Family History  Problem Relation Age of Onset  . Hypertension Father   . Diabetes Father   . Cancer Mother   . Alcohol abuse Brother   . Colon cancer Neg Hx     Vitals:   08/26/16 1126  BP: 136/82  Pulse: 79  SpO2: 100%  Weight: 216 lb (98 kg)     PHYSICAL EXAM: General:  Well appearing male, NAD.  Walked into clinic without difficulty.  HEENT: normal Neck: supple. 8 cm JVP Carotids 2+ bilat; no bruits. No lymphadenopathy or thyromegaly appreciated. Cor: PMI non displaced. Regular rate and rhythm. No rubs, gallops or murmurs. Lungs: Clear in upper lobes, fine crackles in bases.  Abdomen: Soft,  non tender, non distended.  No hepatosplenomegaly. No bruits or masses. Good bowel sounds. Extremities: no cyanosis, clubbing, rash. 1+ pretibial edema.  Neuro: alert & oriented x 3, cranial nerves grossly intact. moves all 4 extremities w/o difficulty. Affect pleasant.   ASSESSMENT & PLAN: 1. 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 He had preexisting NICM diagnosed in 2005 when his EF was 35% and had normal cors. Echo in 05/2016 with EF 30-35% and cath showed  70-75% stenosis in the proximal and mid LAD,90% ostial stenosis in the 1st diagonal, and  60-70% stenosis in the mid RCA. [place] 05/2016-  Fick C.O. 5.86/index 2.74 - NYHA II - Volume status elevated in the setting of dietary noncompliance.  - Increase Entresto to 97/103 mg BID. BMET today, and BMET in 10 days after increase. He will have his repeat BMET drawn at [facility]. They will fax the results.  - Continue Coreg 12.5 mg BID - Increase lasix to 60 mg BID for 2 days, then go back to taking 60 mg in the am and 40 mg in the pm.  - Continue isosorbide 90 mg daily.  - Continue spiro 25mg  daily  2. CAD - left heart cath findings above.  - Medical management recommended. - PCI to LAD felt to be high risk in the setting of cardiomyopathy, CVTS consulted felt that CABG would not improve EF as he has pre existing CM.  - No chest pain.  - Continue isosorbide and Ranexa - Continue ASA and statin.   3. ETOH abuse -Encourage complete cessation.    4. Tobacco abuse - Chews tobacco, encouraged complete cessation.  5. HTN - Well controlled, increasing Entresto as above.   6. CKD stage III - stable. BMET today.   7.  DM - Follows with PCP.   Follow up in 3 months.   Arbutus Leas, NP 08/26/16

## 2016-08-26 NOTE — Patient Outreach (Signed)
Telephone call to patient to schedule home visit, no answer to telephone and received message " voicemail not set up"  PLAN Outreach pt next week  [name] [facility], [place] Coordinator [phone]

## 2016-08-29 ENCOUNTER — Ambulatory Visit: Payer: Medicare Other | Admitting: Family Medicine

## 2016-08-30 MED ORDER — INSULIN GLARGINE 300 UNIT/ML ~~LOC~~ SOPN: 70.0000 [IU] | PEN_INJECTOR | Freq: Every day | SUBCUTANEOUS | 0 refills | Status: DC

## 2016-08-30 MED ORDER — LINAGLIPTIN 5 MG PO TABS: 5.0000 mg | ORAL_TABLET | Freq: Every day | ORAL | 0 refills | Status: DC

## 2016-08-30 NOTE — Telephone Encounter (Signed)
Januvia switched to Tradjenta 5mg  - take 1 tablet daily Patient notified.  Also reviewed medications with a patient. He states he has been taking Toujeo 40 unit bid (I would prefer patient to inject 80 units daily but he prefers to continue with bid dosing) over the last week without problems / hypoglycemia.  He still has not started metformin XR as I had recommended.  I explained that the XR form is less liekly to cause diarrhea / loose stools.  He has agreed to try and will notify office if he continues to have loose stool even with this switch.   Patient is to follow up with PCP Thursday, July 12th.

## 2016-08-30 NOTE — Addendum Note (Signed)
Addended by: Cherre Robins B on: 08/30/2016 10:02 AM   Modules accepted: Orders

## 2016-08-31 ENCOUNTER — Ambulatory Visit: Payer: Self-pay | Admitting: Pharmacist

## 2016-09-01 ENCOUNTER — Ambulatory Visit (INDEPENDENT_AMBULATORY_CARE_PROVIDER_SITE_OTHER): Payer: Medicare Other | Admitting: Pediatrics

## 2016-09-01 ENCOUNTER — Encounter: Payer: Self-pay | Admitting: Pharmacist

## 2016-09-01 ENCOUNTER — Encounter: Payer: Self-pay | Admitting: Pediatrics

## 2016-09-01 VITALS — BP 135/79 | HR 74 | Temp 98.3°F | Ht 69.0 in | Wt 220.0 lb

## 2016-09-01 DIAGNOSIS — E118 Type 2 diabetes mellitus with unspecified complications: Secondary | ICD-10-CM | POA: Diagnosis not present

## 2016-09-01 DIAGNOSIS — E1142 Type 2 diabetes mellitus with diabetic polyneuropathy: Secondary | ICD-10-CM | POA: Diagnosis not present

## 2016-09-01 DIAGNOSIS — E6609 Other obesity due to excess calories: Secondary | ICD-10-CM | POA: Diagnosis not present

## 2016-09-01 DIAGNOSIS — Z794 Long term (current) use of insulin: Secondary | ICD-10-CM | POA: Diagnosis not present

## 2016-09-01 DIAGNOSIS — I2510 Atherosclerotic heart disease of native coronary artery without angina pectoris: Secondary | ICD-10-CM | POA: Diagnosis not present

## 2016-09-01 DIAGNOSIS — Z6834 Body mass index (BMI) 34.0-34.9, adult: Secondary | ICD-10-CM | POA: Diagnosis not present

## 2016-09-01 DIAGNOSIS — E1101 Type 2 diabetes mellitus with hyperosmolarity with coma: Secondary | ICD-10-CM | POA: Diagnosis not present

## 2016-09-01 DIAGNOSIS — I10 Essential (primary) hypertension: Secondary | ICD-10-CM | POA: Diagnosis not present

## 2016-09-01 DIAGNOSIS — E7800 Pure hypercholesterolemia, unspecified: Secondary | ICD-10-CM | POA: Diagnosis not present

## 2016-09-01 LAB — BAYER DCA HB A1C WAIVED: HB A1C (BAYER DCA - WAIVED): 9.7 % — ABNORMAL HIGH (ref <7.0)

## 2016-09-01 MED ORDER — INSULIN GLARGINE 100 UNIT/ML SOLOSTAR PEN: PEN_INJECTOR | SUBCUTANEOUS | 99 refills | Status: DC

## 2016-09-01 MED ORDER — METFORMIN HCL ER 500 MG PO TB24: 500.0000 mg | ORAL_TABLET | Freq: Every day | ORAL | 1 refills | Status: DC

## 2016-09-01 NOTE — Patient Instructions (Signed)
Take tradjenta and metformin every morning  Take 40u Lantus in the morning, 35units at night  Avoid salty foods Try to keep to 2-2.5L of fluid or less a day for next few days Let us know if swelling does not improve or if weight doesn't start to go down

## 2016-09-01 NOTE — Progress Notes (Signed)
  Subjective:   Patient ID: Brad Mendoza, male    DOB: Jan 16, 1956, 61 y.o.   MRN: [id number] CC: Follow-up (3 month) med problems HPI: Brad Mendoza is a 61 y.o. male presenting for Follow-up (3 month)  DM2: getting AM BGLs in low 100s two lows of 52 Taking 40u twice a day of lantus hasnt taken metformin in a while Says he wants to try the new metformin "milder kind"  HTN: no HA, no CP  Having some back pain Says he injured his back about 10 yrs ago Was seeing orthopedics, was getting injections Helped some Pain got better For past 2-3 months having some pain in back with lifting Has tried physical therapy in the past  Relevant past medical, surgical, family and social history reviewed. Allergies and medications reviewed and updated. History  Smoking Status  . Never Smoker  Smokeless Tobacco  . Current User  . Types: Snuff    Comment: dips snuff about 6 cans/week for 3 years   ROS: Per HPI   Objective:    BP 135/79   Pulse 74   Temp 98.3 F (36.8 C) (Oral)   Ht '5\' 9"'$  (1.753 m)   Wt 220 lb (99.8 kg)   BMI 32.49 kg/m   Wt Readings from Last 3 Encounters:  09/01/16 220 lb (99.8 kg)  08/26/16 216 lb (98 kg)  08/23/16 206 lb (93.4 kg)    Gen: NAD, alert, cooperative with exam, NCAT EYES: EOMI, no conjunctival injection, or no icterus ENT:  TMs pearly gray b/l, OP without erythema LYMPH: no cervical LAD CV: NRRR, normal S1/S2, no murmur, distal pulses 2+ b/l Resp: CTABL, no wheezes, normal WOB Abd: +BS, soft, NTND. no guarding or organomegaly Ext: 1+ pitting edema b/l LE, warm Neuro: Alert and oriented, strength equal b/l UE and LE, coordination grossly normal MSK: normal muscle bulk  Assessment & Plan:  Brad Mendoza was seen today for follow-up.  Diagnoses and all orders for this visit:  Essential hypertension Stable, cont meds -     CMP14+EGFR  Pure hypercholesterolemia Stable, cont crestor -     CMP14+EGFR  Type 2 diabetes mellitus with complication,  with long-term current use of insulin (HCC) A1c 9.7, improving Recently started on tradjenta Some low BGLs with 40units BID lantus, will decrease to 40u in AM, 35u pm Cont metformin -     CMP14+EGFR -     Bayer DCA Hb A1c Waived -     Insulin Glargine (LANTUS SOLOSTAR) 100 UNIT/ML Solostar Pen; Take 40 units in the morning, take 35 units at night -     metFORMIN (GLUCOPHAGE-XR) 500 MG 24 hr tablet; Take 1 tablet (500 mg total) by mouth daily with breakfast.  CHF Some LE swelling No SOB Weight up a few pounds Recently increased lasix with cardiology Cont to check daily let us know if not improving  Follow up plan: Return in about 3 months (around 12/02/2016) for med follow up. Assunta Found, MD [place]

## 2016-09-02 ENCOUNTER — Other Ambulatory Visit: Payer: Self-pay | Admitting: Pharmacist

## 2016-09-02 ENCOUNTER — Other Ambulatory Visit: Payer: Self-pay | Admitting: *Deleted

## 2016-09-02 LAB — CMP14+EGFR
ALBUMIN: 2.6 g/dL — AB (ref 3.6–4.8)
ALK PHOS: 69 IU/L (ref 39–117)
ALT: 24 IU/L (ref 0–44)
AST: 29 IU/L (ref 0–40)
Albumin/Globulin Ratio: 1.1 — ABNORMAL LOW (ref 1.2–2.2)
BUN / CREAT RATIO: 21 (ref 10–24)
BUN: 31 mg/dL — AB (ref 8–27)
Bilirubin Total: <0.2 mg/dL (ref 0.0–1.2)
CO2: 24 mmol/L (ref 20–29)
Calcium: 8.4 mg/dL — ABNORMAL LOW (ref 8.6–10.2)
Chloride: 106 mmol/L (ref 96–106)
Creatinine, Ser: 1.47 mg/dL — ABNORMAL HIGH (ref 0.76–1.27)
GFR calc Af Amer: 59 mL/min/{1.73_m2} — ABNORMAL LOW (ref >59)
GFR calc non Af Amer: 51 mL/min/{1.73_m2} — ABNORMAL LOW (ref >59)
GLOBULIN, TOTAL: 2.4 g/dL (ref 1.5–4.5)
GLUCOSE: 181 mg/dL — AB (ref 65–99)
Potassium: 5.3 mmol/L — ABNORMAL HIGH (ref 3.5–5.2)
SODIUM: 142 mmol/L (ref 134–144)
Total Protein: 5 g/dL — ABNORMAL LOW (ref 6.0–8.5)

## 2016-09-02 NOTE — Patient Outreach (Signed)
[facility]) Care Management  09/02/2016  DAK SZUMSKI 1955-07-22 [id number]   Called patient to follow up after his PCP visit. HIPAA identifiers were obtained.  Patient saw cardiology 08/26/16. Delene Loll was increased to 97/103 twice daily and furosemide was increased to 60mg  twice daily for  R.N. days then back to 6 in the morning and 40mg  in the afternoon.  Patient reported switching back the furosemide dose as instructed.  During the PCP visit, Januvia was changed to [place] as was requested and the patient's Toujeo dose was decreased back to 70 units daily.    Patient agreed to re-try metformin XR.  Plan:  Follow up with the patient in 2 weeks.  Consider closing pharmacy case.   Elayne Guerin, PharmD, [place] Clinical Pharmacist [phone]

## 2016-09-02 NOTE — Patient Outreach (Signed)
Telephone call to patient to schedule home visit, spoke with pt, HIPAA verified, initial home visit scheduled for week of 09/12/16.  [name] [facility], [place] Coordinator [phone]

## 2016-09-07 ENCOUNTER — Ambulatory Visit (INDEPENDENT_AMBULATORY_CARE_PROVIDER_SITE_OTHER): Payer: Medicare Other | Admitting: Internal Medicine

## 2016-09-07 ENCOUNTER — Other Ambulatory Visit: Payer: Self-pay | Admitting: Family Medicine

## 2016-09-07 ENCOUNTER — Other Ambulatory Visit: Payer: Self-pay | Admitting: Pharmacist

## 2016-09-07 ENCOUNTER — Encounter (INDEPENDENT_AMBULATORY_CARE_PROVIDER_SITE_OTHER): Payer: Self-pay | Admitting: Internal Medicine

## 2016-09-07 DIAGNOSIS — I10 Essential (primary) hypertension: Secondary | ICD-10-CM

## 2016-09-09 ENCOUNTER — Telehealth: Payer: Self-pay | Admitting: Pediatrics

## 2016-09-09 NOTE — Telephone Encounter (Signed)
Pt aware.

## 2016-09-09 NOTE — Telephone Encounter (Signed)
Labs being reviewed, covering for PCP.  Patient had his A1c discussed that visit.  Glucose elevated consistent with diabetes. Kidney function is reduced but stable. Potassium is slightly elevated but stable also. Liver enzymes look good.  Forward to PCP for any desire interventions.  Laroy Apple, MD [facility] 09/09/2016, 4:48 PM

## 2016-09-09 NOTE — Telephone Encounter (Signed)
Patient aware of results.

## 2016-09-13 ENCOUNTER — Other Ambulatory Visit: Payer: Self-pay | Admitting: Pharmacist

## 2016-09-13 ENCOUNTER — Telehealth: Payer: Self-pay | Admitting: Pediatrics

## 2016-09-13 NOTE — Telephone Encounter (Signed)
Please contact the patient DC Lisinopril. Will Ask Dr. Evette Doffing to consider for a replacement

## 2016-09-13 NOTE — Patient Outreach (Addendum)
[name]) Care Management  09/13/2016  Brad Mendoza 08-18-1955 [id number]   Patient called with questions about his medications.  He wondered about his cardiology medications. HIPAA identifiers were obtained.   Patient's medications were reviewed over the phone as well as the last Cardiology and PCP notes.    Findings:  Drug Interactions:  Entresto/Lisinopril: Coadministration of  (ARB) and (ACEI) may be associated with an increased risk of renal dysfunction and hyperkalemia without additional therapeutic benefit.  Spironolactone/lisinopril/Entresto-increased risk of hyperkalemia--patient's most recent K-5.3  Medication List Discrepancies: At the cardiology visit on 08/26/16, neither lisinopril or potassium were on the active medication list.  The PCP note from 09/01/16 did not address Lisinopril or Potassium  09/07/16-refill/new [facility] for Lisinopril and Potassium  Patient was instructed to hold lisinopril and potassium today as these medications are being vetted.      Plan:  [facility] office was called and a message was left about lisinopril, potassium and Entresto.  (I asked to speak with someone but was told I could only leave a message)  Route note to PCP and [facility] Clinical Pharmacist, Tammy Eckard.  Follow up with the patient in 1-2 business days.

## 2016-09-14 ENCOUNTER — Ambulatory Visit: Payer: Self-pay | Admitting: Pediatrics

## 2016-09-14 ENCOUNTER — Encounter: Payer: Self-pay | Admitting: *Deleted

## 2016-09-14 ENCOUNTER — Other Ambulatory Visit: Payer: Self-pay | Admitting: *Deleted

## 2016-09-14 NOTE — Patient Outreach (Signed)
[facility]) Care Management   09/14/2016  Brad Mendoza 1955/08/16 [id number]  Brad Mendoza is an 61 y.o. male  Subjective: Initial home visit with pt, HIPAA verified, pt reports he is trying to eat food out of his garden and choose healthier.  Pt states he weighs daily but does not record, checks CBG TID and does not record, pt states he is not interested in keeping records and will not record.  Pt states he does not have a car and relies on other people for transportation and walks to places.  Pt states he does not want to use [place].  Objective:   Vitals:   09/14/16 1448  BP: 126/64  Resp: 16  SpO2: 98%  Weight: 198 lb (89.8 kg)  Height: 1.753 m (5\' 9" )  Fasting CBG 220 7,14,30 day average 177 per glucometer Pt states most blood sugar in 200's range ROS  Physical Exam  Constitutional: He is oriented to person, place, and time. He appears well-nourished.  HENT:  Head: Normocephalic.  Neck: Normal range of motion. Neck supple.  Cardiovascular: Normal rate.   Irregular rhythm  Respiratory: Effort normal and breath sounds normal.  GI: Soft. Bowel sounds are normal.  Musculoskeletal: Normal range of motion. He exhibits no edema.  Neurological: He is alert and oriented to person, place, and time.  Skin: Skin is warm and dry.  Psychiatric: He has a normal mood and affect. His behavior is normal. Judgment and thought content normal.    Encounter Medications:   Outpatient Encounter Prescriptions as of 09/14/2016  Medication Sig Note  . albuterol (PROVENTIL HFA;VENTOLIN HFA) 108 (90 Base) MCG/ACT inhaler Inhale 2 puffs into the lungs every 6 (six) hours as needed for wheezing or shortness of breath. 02/29/2016: Uses PRN  . amLODipine (NORVASC) 10 MG tablet TAKE ONE (1) TABLET EACH DAY   . aspirin EC 81 MG tablet Take 81 mg by mouth daily.   . Capsaicin (ICY HOT ARTHRITIS THERAPY EX) Apply 1 application topically 2 (two) times daily as needed (back  pain).    . carvedilol (COREG) 12.5 MG tablet TAKE ONE TABLET TWICE A DAY WITH FOOD   . fluticasone (FLONASE) 50 MCG/ACT nasal spray USE 2 SPRAYS IN EACH NOSTRIL DAILY   . furosemide (LASIX) 20 MG tablet Take 2-3 tablets (40-60 mg total) by mouth as directed. 60 mg (3 tabs) in the AM and 40 mg (2 tabs) in the PM   . Insulin Glargine (LANTUS SOLOSTAR) 100 UNIT/ML Solostar Pen Take 40 units in the morning, take 35 units at night 09/14/2016: Pt has insulin glargine (toujeo) on hand and taking  . isosorbide mononitrate (IMDUR) 30 MG 24 hr tablet Take 3 tablets (90 mg total) by mouth daily. (Patient taking differently: Take 60 mg by mouth daily. Take 1.5 tablets daily)   . isosorbide mononitrate (IMDUR) 60 MG 24 hr tablet TAKE 1 AND 1/2 TABLETS DAILY   . lisinopril (PRINIVIL,ZESTRIL) 40 MG tablet Take 40 mg by mouth daily.   . metFORMIN (GLUCOPHAGE-XR) 500 MG 24 hr tablet Take 1 tablet (500 mg total) by mouth daily with breakfast.   . Multiple Vitamins-Minerals (CENTRUM SILVER ADULT 50+ PO) Take 1 tablet by mouth daily.   . Omega-3 Fatty Acids (FISH OIL) 1000 MG CPDR Take 1 capsule by mouth daily.   . pantoprazole (PROTONIX) 40 MG tablet TAKE ONE (1) TABLET EACH DAY   . ranolazine (RANEXA) 500 MG 12 hr tablet Take 1 tablet (500 mg  total) by mouth 2 (two) times daily.   . rosuvastatin (CRESTOR) 10 MG tablet TAKE ONE (1) TABLET EACH DAY   . sacubitril-valsartan (ENTRESTO) 97-103 MG Take 1 tablet by mouth 2 (two) times daily.   [name] spironolactone (ALDACTONE) 25 MG tablet TAKE 1/2 TABLET DAILY   . terbinafine (LAMISIL) 1 % cream Apply 1 application topically 2 (two) times daily. Apply to both feet and between toes   . TRADJENTA 5 MG TABS tablet TAKE ONE (1) TABLET EACH DAY   . ULTICARE MINI PEN NEEDLES 31G X 6 MM MISC 4 TIMES DAILY   . HUMALOG KWIKPEN 100 UNIT/ML KiwkPen INJECT UP TO 15 UNITS PRIOR TO EACH MEALAS NEEDED FOR ELAVATED BG (Patient not taking: Reported on 09/14/2016)   . nitroGLYCERIN (NITROSTAT)  0.4 MG SL tablet DISSOLVE 1 TAB UNDER TOUNGE FOR CHEST PAIN. MAY REPEAT EVERY 5 MINUTES FOR 3 DOSES. IF NO RELIEF CALL 911 OR GO TO ER (Patient not taking: Reported on 09/14/2016)   . potassium chloride SA (K-DUR,KLOR-CON) 20 MEQ tablet TAKE ONE (1) TABLET EACH DAY WHILE TAKING FUROSEMIDE (Patient not taking: Reported on 09/13/2016)    No facility-administered encounter medications on file as of 09/14/2016.     Functional Status:   In your present state of health, do you have any difficulty performing the following activities: 09/14/2016 07/07/2016  Hearing? N N  Vision? N N  Difficulty concentrating or making decisions? N N  Walking or climbing stairs? N N  Dressing or bathing? N N  Doing errands, shopping? N N  Preparing Food and eating ? N N  Using the Toilet? N N  In the past six months, have you accidently leaked urine? N N  Do you have problems with loss of bowel control? N N  Managing your Medications? N N  Managing your Finances? N N  Housekeeping or managing your Housekeeping? N N  Some recent data might be hidden    Fall/Depression Screening:    Fall Risk  09/14/2016 09/01/2016 08/23/2016  Falls in the past year? No No No  Risk for fall due to : Medication side effect - -   PHQ 2/9 Scores 09/14/2016 09/01/2016 08/22/2016 07/07/2016 06/01/2016 05/30/2016 03/15/2016  PHQ - 2 Score 0 0 0 0 0 0 0    Assessment:  Pt states he is managing "pretty well" at home.  Does not have one particular person that he relies on but calls different relatives for transportation.  RN CM reviewed CHF zones and carbohydrate counting with pt.  RN CM observed all medications and reviewed with pt.  RN CM faxed initial home visit and barrier letter to primary MD Dr. Assunta Found.    THN CM Care Plan Problem One     Most Recent Value  Care Plan Problem One  Knowledge deficit related to CHF  Role Documenting the Problem One  Care Management Coordinator  Care Plan for Problem One  Active  THN Long Term Goal   pt  will verbalize better understanding of disease process CHF to facilitate better outcomes and avoid hospitalization within 90 days.  THN Long Term Goal Start Date  09/14/16  Interventions for Problem One Long Term Goal  Rn CM praised and encouraged pt for weighing daily and correlation of weight gain to exacerbation of CHF.  THN CM Short Term Goal #1   Pt will verbalize CHF zones within 30 days.  THN CM Short Term Goal #1 Start Date  09/14/16  Interventions for Short Term  Goal #1  RN CM reviewed CHF zones/ action plan and when to call MD, who to call (cardiologist)  [facility] CM Short Term Goal #2   Pt will verbalize which MD to call (cardiologist) and telephone number for exacerbation of CHF within 30 ays.  THN CM Short Term Goal #2 Start Date  09/14/16  Interventions for Short Term Goal #2  RN CM reviewed importance of calling correct MD for weight gain, edema, cough etc. and follow up with MD, ask pt to call RN CM if any issues contacting MD      Plan: see pt for home visit next month  [name] [facility], [place] Coordinator [phone]

## 2016-09-14 NOTE — Telephone Encounter (Signed)
No, he should be on Entresto alone, not on the lisinopril. Lisinopril was stopped when he started the entresto.

## 2016-09-14 NOTE — Telephone Encounter (Signed)
Aware. 

## 2016-09-14 NOTE — Telephone Encounter (Signed)
Spoke with pt, he is not taking lisinopril.

## 2016-09-15 ENCOUNTER — Other Ambulatory Visit: Payer: Self-pay | Admitting: Pharmacist

## 2016-09-15 ENCOUNTER — Other Ambulatory Visit: Payer: Self-pay | Admitting: *Deleted

## 2016-09-15 NOTE — Patient Outreach (Signed)
[facility]) Care Management  09/15/2016  Brad Mendoza 05/07/1955 [id number]   Called patient to follow up lisinopril and reporting to his provider the patient was taking lisinopril, potassium and Entresto.  HIPAA identifiers were obtained.  Despite being told to stop taking lisinopril, patient reported still taking it this morning.  He was encouraged to put the lisinopril off to the side so he will not get confused and take it again.  Patient said the provider's office also told him to hold potassium.  Plan:  Continue with previously planned home visit to review patient's medications in person next week.  Elayne Guerin, PharmD, [place] Clinical Pharmacist [phone]

## 2016-09-16 ENCOUNTER — Telehealth (HOSPITAL_COMMUNITY): Payer: Self-pay | Admitting: *Deleted

## 2016-09-16 ENCOUNTER — Ambulatory Visit: Payer: Medicare Other | Admitting: Pharmacist

## 2016-09-16 ENCOUNTER — Other Ambulatory Visit: Payer: Self-pay | Admitting: Pediatrics

## 2016-09-16 NOTE — Telephone Encounter (Signed)
Patient called and reported 3 lb weight gain overnight.  He take 60 mg of lasix in the am and 40 mg of lasix in the PM.  Will send to Oda Kilts, PA to review and told patient I would call him back.

## 2016-09-16 NOTE — Telephone Encounter (Signed)
Called and spoke with patient, he is agreeable with plan. He requested for labs to be drawn at his PCP.  Per Glennie Hawk I have faxed BMET prescription to patient's PCP and requested for them to fax results to our office. No further questions at this time.

## 2016-09-16 NOTE — Telephone Encounter (Signed)
Can take 60 mg in the evening, and can repeat as needed.    Legrand Como [address] [place], PA-C 09/16/2016 11:44 AM

## 2016-09-19 ENCOUNTER — Other Ambulatory Visit (HOSPITAL_COMMUNITY): Payer: Self-pay

## 2016-09-19 DIAGNOSIS — I5022 Chronic systolic (congestive) heart failure: Secondary | ICD-10-CM

## 2016-09-21 ENCOUNTER — Other Ambulatory Visit: Payer: Medicare Other

## 2016-09-21 DIAGNOSIS — I5022 Chronic systolic (congestive) heart failure: Secondary | ICD-10-CM

## 2016-09-22 ENCOUNTER — Encounter: Payer: Self-pay | Admitting: Pharmacist

## 2016-09-22 ENCOUNTER — Other Ambulatory Visit: Payer: Self-pay | Admitting: Pharmacist

## 2016-09-22 LAB — BASIC METABOLIC PANEL
BUN/Creatinine Ratio: 25 — ABNORMAL HIGH (ref 10–24)
BUN: 43 mg/dL — ABNORMAL HIGH (ref 8–27)
CHLORIDE: 105 mmol/L (ref 96–106)
CO2: 22 mmol/L (ref 20–29)
Calcium: 8.7 mg/dL (ref 8.6–10.2)
Creatinine, Ser: 1.7 mg/dL — ABNORMAL HIGH (ref 0.76–1.27)
GFR calc Af Amer: 50 mL/min/{1.73_m2} — ABNORMAL LOW (ref >59)
GFR, EST NON AFRICAN AMERICAN: 43 mL/min/{1.73_m2} — AB (ref >59)
Glucose: 165 mg/dL — ABNORMAL HIGH (ref 65–99)
POTASSIUM: 4.6 mmol/L (ref 3.5–5.2)
SODIUM: 141 mmol/L (ref 134–144)

## 2016-09-22 LAB — PLEASE NOTE

## 2016-09-22 NOTE — Addendum Note (Signed)
Addended by: Elayne Guerin on: 09/22/2016 02:38 PM   Modules accepted: Orders

## 2016-09-22 NOTE — Patient Outreach (Addendum)
[facility]) Care Management  [place]   09/22/2016  Brad Mendoza 04/21/1955 [id number]  Subjective: Home visit completed at patient's home.  HIPAA identifiers were obtained.  Patient was referred for medication management by his PCP.  Patient is a 61 year old male with multiple medical conditions including but not limited to:  CHF, hyperlipidemia, hypertension, diabetes and CAD.    Patient manages his own medications but does not use a pill box.  He prefers to use the actual bottles versus pill packs or a pill box.   Purpose of today's visit was to review patient's home medications after some medications were refilled that had been previously discontinued (lisinopril and potassium)  Objective:  Weight: 198 pounds Fasting Blood Glucose-177mg /dl  Encounter Medications: Outpatient Encounter Prescriptions as of 09/22/2016  Medication Sig Note  . albuterol (PROVENTIL HFA;VENTOLIN HFA) 108 (90 Base) MCG/ACT inhaler Inhale 2 puffs into the lungs every 6 (six) hours as needed for wheezing or shortness of breath. 02/29/2016: Uses PRN  . amLODipine (NORVASC) 10 MG tablet TAKE ONE (1) TABLET EACH DAY 09/22/2016: Patient is only taking 1/2 or 5mg  daily  . aspirin EC 81 MG tablet Take 81 mg by mouth daily.   . carvedilol (COREG) 12.5 MG tablet TAKE ONE TABLET TWICE A DAY WITH FOOD   . fluticasone (FLONASE) 50 MCG/ACT nasal spray USE 2 SPRAYS IN EACH NOSTRIL DAILY   . furosemide (LASIX) 20 MG tablet Take 2-3 tablets (40-60 mg total) by mouth as directed. 60 mg (3 tabs) in the AM and 40 mg (2 tabs) in the PM   . Insulin Glargine (LANTUS SOLOSTAR) 100 UNIT/ML Solostar Pen Take 40 units in the morning, take 35 units at night 09/14/2016: Pt has insulin glargine (toujeo) on hand and taking  . isosorbide mononitrate (IMDUR) 60 MG 24 hr tablet TAKE 1 AND 1/2 TABLETS DAILY   . metFORMIN (GLUCOPHAGE-XR) 500 MG 24 hr tablet Take 1 tablet (500 mg total) by mouth daily with breakfast.   .  Multiple Vitamins-Minerals (CENTRUM SILVER ADULT 50+ PO) Take 1 tablet by mouth daily.   . Omega-3 Fatty Acids (FISH OIL) 1000 MG CPDR Take 1 capsule by mouth daily.   . pantoprazole (PROTONIX) 40 MG tablet TAKE ONE (1) TABLET EACH DAY   . ranolazine (RANEXA) 500 MG 12 hr tablet Take 1 tablet (500 mg total) by mouth 2 (two) times daily.   . rosuvastatin (CRESTOR) 10 MG tablet TAKE ONE (1) TABLET EACH DAY   . sacubitril-valsartan (ENTRESTO) 97-103 MG Take 1 tablet by mouth 2 (two) times daily.   [name] spironolactone (ALDACTONE) 25 MG tablet TAKE 1/2 TABLET DAILY   . TRADJENTA 5 MG TABS tablet TAKE ONE (1) TABLET EACH DAY   . Capsaicin (ICY HOT ARTHRITIS THERAPY EX) Apply 1 application topically 2 (two) times daily as needed (back pain).    [name] HUMALOG KWIKPEN 100 UNIT/ML KiwkPen INJECT UP TO 15 UNITS PRIOR TO EACH MEALAS NEEDED FOR ELAVATED BG (Patient not taking: Reported on 09/22/2016)   . nitroGLYCERIN (NITROSTAT) 0.4 MG SL tablet DISSOLVE 1 TAB UNDER TOUNGE FOR CHEST PAIN. MAY REPEAT EVERY 5 MINUTES FOR 3 DOSES. IF NO RELIEF CALL 911 OR GO TO ER (Patient not taking: Reported on 09/22/2016)   . terbinafine (LAMISIL) 1 % cream Apply 1 application topically 2 (two) times daily. Apply to both feet and between toes   . ULTICARE MINI PEN NEEDLES 31G X 6 MM MISC 4 TIMES DAILY   . [  DISCONTINUED] isosorbide mononitrate (IMDUR) 30 MG 24 hr tablet Take 3 tablets (90 mg total) by mouth daily. (Patient not taking: Reported on 09/22/2016)    No facility-administered encounter medications on file as of 09/22/2016.     Functional Status: In your present state of health, do you have any difficulty performing the following activities: 09/14/2016 07/07/2016  Hearing? N N  Vision? N N  Difficulty concentrating or making decisions? N N  Walking or climbing stairs? N N  Dressing or bathing? N N  Doing errands, shopping? N N  Preparing Food and eating ? N N  Using the Toilet? N N  In the past six months, have you accidently  leaked urine? N N  Do you have problems with loss of bowel control? N N  Managing your Medications? N N  Comment pt reports no problems managing medications -  Managing your Finances? N N  Housekeeping or managing your Housekeeping? N N  Some recent data might be hidden    Fall/Depression Screening: Fall Risk  09/14/2016 09/01/2016 08/23/2016  Falls in the past year? No No No  Risk for fall due to : Medication side effect - -   PHQ 2/9 Scores 09/14/2016 09/01/2016 08/22/2016 07/07/2016 06/01/2016 05/30/2016 03/15/2016  PHQ - 2 Score 0 0 0 0 0 0 0      Assessment: Patient's medications were reviewed with actual medication bottles during the home visit.   Drugs sorted by system:  Neurologic/Psychologic:  Cardiovascular: Amlodipine Aspirin 81mg  Carvedilol  Furosemide Isosorbide Ranexa Rosuvastatin Entresto Spironolactone Nitroglycerin Omega-3 Fatty Acids  Pulmonary/Allergy: Albuterol HFA Fluticasone   Gastrointestinal: Pantoprazole  Endocrine: Metformin Tradjenta Lantus Humalog--patient reported not using   Renal:  Topical: Capsaicin terbinafine   Vitamins/Minerals: Centrum Sliver   Medication review findings:  Dose Discrepancy:  Amlodipine-patient reported taking Amlodipine 10mg  1/2 tablet daily (5mg  daily).  Prescription label and medication list stated Amlodipine 10mg  1 tablet daily.  Clarification is needed.  If patient is only taking 5mg , switching to the 5mg  tablets would eliminate the need to split tablets.  Adherence:  Patient reported that he does not use Humalog prior to meals. He said he was told he no longer needed to use rapid acting insulin and only needed to use Lantus.  Clarification is needed. Patient's last HgA1c was 10.1% in April 2018.  Patient also requested help with getting financially organized.  A referral will be placed for social work.  Plan: 1.  Route note to [employer] at [place].  2.  Follow up  with the patient in 2-3 business days on the med review findings.  Elayne Guerin, PharmD, [place] Clinical Pharmacist [phone]

## 2016-09-26 ENCOUNTER — Other Ambulatory Visit: Payer: Self-pay | Admitting: Pharmacist

## 2016-09-26 ENCOUNTER — Other Ambulatory Visit: Payer: Self-pay | Admitting: Licensed Clinical Social Worker

## 2016-09-26 ENCOUNTER — Encounter: Payer: Self-pay | Admitting: Licensed Clinical Social Worker

## 2016-09-26 NOTE — Patient Outreach (Signed)
[facility]) Care Management  09/26/2016  PACE LAMADRID November 22, 1955 [id number]   Called patient to follow up with him after his home visit on 09/22/16.  HIPAA identifiers were obtained.  Patient reported he was feeling fine. He said his blood sugar was 109 mg/dl this morning. A note was sent to [place] via Cherre Robins, PharmD about patient not using Humalog as prescribed.  An in basket message will be sent as I have not heard back about the patient's insulin.   Plan:  Follow up with the patient in 5-7 business days.  Elayne Guerin, PharmD, [place] Clinical Pharmacist [phone]

## 2016-09-26 NOTE — Patient Outreach (Signed)
Assessment:  CSW received referral on Brad Mendoza. CSW completed chart review on client on 09/26/16. Client sees Dr. Assunta Mendoza as primary care doctor. Client is receiving [facility] nursing support with RN Brad Mendoza.  Client is receiving [facility] pharmacy support with pharmacist Brad Mendoza. CSW called client on 09/26/16 and spoke via phone with client. CSW verified client identity. CSW received verbal permission from client on 09/26/16 for CSW to communicate with client about client needs and status. CSW and client reviewed and completed needed [facility] assessments for client. Client said he had his prescribed medications and is taking medications as prescribed. Client said he did not have a car for transportation. He said he relies on friends or relatives to transport him to and from his scheduled medical appointments or to complete needed community errands for client. CSW spoke with client about [place] and trip costs for in [place] transport assistance through that transport program.  [place] and client spoke of client care plan needs. CSW encouraged client to communicate with CSW in next 30 days to discuss financial needs of client and to discuss financial resources for client in the area. CSW spoke with client about client's calling Financial counselor at [facility] to discuss client payment plan on bill owed by client to [place] encouraged client to call [facility] and talk with hospital financial counselor about current bill owed by client to [place] and client spoke of client financial needs.  CSW and client completed Financial Assessment Form for client. Client receives monthly Social Security Disability check. This is client's only monthly income. Client has adequate food supply. Client said he is sleeping well.  Client wears glasses to assist with vision.  Client's daughter resides in [place], [place] and provides client some support. CSW  encouraged client to communicate with his daughter in helping client to better mange client finances. He said he would talk with his daughter about finding out more about client current credit score. He is making monthly loan payments.  He said he sometimes becomes confused in managing his bill each month. CSW thanked client for phone call with CSW on 09/26/16. CSW encouraged client to call CSW at [phone] as needed to discuss social work needs of client. Client was appreciative of phone call from [name] on 09/26/16.    Plan:  Client to communicate with CSW in next 30 days to discuss financial needs of client and to discuss financial resources for client in the area  CSW to collaborate as needed with RN Brad Mendoza in monitoring needs of client.  CSW to call client in 3 weeks to assess client needs at that time.  Brad Mendoza.Brad Mendoza MSW, LCSW Licensed Clinical Social Worker [facility] Care Management [phone]

## 2016-09-29 ENCOUNTER — Other Ambulatory Visit: Payer: Self-pay | Admitting: Pharmacist

## 2016-09-29 NOTE — Consult Note (Signed)
Patient was called to discuss current medications.  I last saw patient at the end of May 2018 about 2 months after hospital discharge. At that time I reviewed medications and provided patient with list of meds as well as faxed current med list to his pharmacy.   Also referred to [facility] Medication Management team who have been working with Brad Mendoza to ensure that he is taking meds correctly.  There has been some confusion which occurred after refill requests were received July 2019 for meds that had been discontinued and that resulted in patient restart lisinopril and amlodipine.  Also Humalog showed back up on his med profile but patient states that he has not been taking Humalog.   I reviewed meds with patient and advised him to stop amlodipine.  He is to come in next week with all his meds and we will review them and to recheck BP.   I will forward this message to Viann Shove, PharmD who has been assisting with phone calls and home visit and also refax a list of all current medications to his pharmacy.

## 2016-10-04 ENCOUNTER — Other Ambulatory Visit: Payer: Self-pay | Admitting: Pharmacist

## 2016-10-04 ENCOUNTER — Ambulatory Visit (INDEPENDENT_AMBULATORY_CARE_PROVIDER_SITE_OTHER): Payer: Medicare Other | Admitting: Pharmacist

## 2016-10-04 ENCOUNTER — Encounter: Payer: Self-pay | Admitting: Pharmacist

## 2016-10-04 VITALS — BP 128/70 | HR 80 | Ht 69.0 in | Wt 214.0 lb

## 2016-10-04 DIAGNOSIS — R7989 Other specified abnormal findings of blood chemistry: Secondary | ICD-10-CM | POA: Diagnosis not present

## 2016-10-04 DIAGNOSIS — Z79899 Other long term (current) drug therapy: Secondary | ICD-10-CM

## 2016-10-04 DIAGNOSIS — E118 Type 2 diabetes mellitus with unspecified complications: Secondary | ICD-10-CM | POA: Diagnosis not present

## 2016-10-04 DIAGNOSIS — Z794 Long term (current) use of insulin: Secondary | ICD-10-CM

## 2016-10-04 NOTE — Progress Notes (Addendum)
Patient ID: Brad Mendoza, male   DOB: April 07, 1955, 61 y.o.   MRN: [id number]    Subjective:    Brad Mendoza is a 61 y.o. male who presents for Medication Management and to recheck type 2 DM, requiring insulin therapy which is not currently well controlled.  I last saw Brad Mendoza about 2 and 1/2 months ago.  At that time we reviewed medication changes which has been made during hospitalization in April and at follow up appt with cardiologist.  At that visit a referral had been made for in home pharmacy and nursing assessment from [facility].  They have diligently worked over the last 2 months to ensure patient has been taking meds appropriately.    Current Outpatient Prescriptions:  .  albuterol (PROVENTIL HFA;VENTOLIN HFA) 108 (90 Base) MCG/ACT inhaler, Inhale 2 puffs into the lungs every 6 (six) hours as needed for wheezing or shortness of breath., Disp: 1 Inhaler, Rfl: 2 .  aspirin EC 81 MG tablet, Take 81 mg by mouth daily., Disp: , Rfl:  .  carvedilol (COREG) 12.5 MG tablet, TAKE ONE TABLET TWICE A DAY WITH FOOD, Disp: 180 tablet, Rfl: 1 .  fluticasone (FLONASE) 50 MCG/ACT nasal spray, USE 2 SPRAYS IN EACH NOSTRIL DAILY, Disp: 48 g, Rfl: 1 .  furosemide (LASIX) 20 MG tablet, Take 2-3 tablets (40-60 mg total) by mouth as directed. 60 mg (3 tabs) in the AM and 40 mg (2 tabs) in the PM, Disp: 150 tablet, Rfl: 3 .  gabapentin (NEURONTIN) 300 MG capsule, Take 300-600 mg by mouth at bedtime as needed., Disp: , Rfl:  .  isosorbide mononitrate (IMDUR) 60 MG 24 hr tablet, TAKE 1 AND 1/2 TABLETS DAILY, Disp: 135 tablet, Rfl: 1 .  metFORMIN (GLUCOPHAGE-XR) 500 MG 24 hr tablet, Take 1 tablet (500 mg total) by mouth daily with breakfast., Disp: 30 tablet, Rfl: 1 .  Multiple Vitamins-Minerals (CENTRUM SILVER ADULT 50+ PO), Take 1 tablet by mouth daily., Disp: , Rfl:  .  nitroGLYCERIN (NITROSTAT) 0.4 MG SL tablet, DISSOLVE 1 TAB UNDER TOUNGE FOR CHEST PAIN. MAY REPEAT EVERY 5 MINUTES FOR 3 DOSES. IF NO RELIEF CALL  911 OR GO TO ER, Disp: 25 tablet, Rfl: 2 .  Omega-3 Fatty Acids (FISH OIL) 1000 MG CPDR, Take 1 capsule by mouth daily., Disp: , Rfl:  .  pantoprazole (PROTONIX) 40 MG tablet, TAKE ONE (1) TABLET EACH DAY, Disp: 90 tablet, Rfl: 1 .  ranolazine (RANEXA) 500 MG 12 hr tablet, Take 1 tablet (500 mg total) by mouth 2 (two) times daily., Disp: 60 tablet, Rfl: 6 .  rosuvastatin (CRESTOR) 10 MG tablet, TAKE ONE (1) TABLET EACH DAY, Disp: 90 tablet, Rfl: 0 .  sacubitril-valsartan (ENTRESTO) 97-103 MG, Take 1 tablet by mouth 2 (two) times daily., Disp: 180 tablet, Rfl: 3 .  spironolactone (ALDACTONE) 25 MG tablet, TAKE 1/2 TABLET DAILY, Disp: 45 tablet, Rfl: 1 .  terbinafine (LAMISIL) 1 % cream, Apply 1 application topically 2 (two) times daily. Apply to both feet and between toes, Disp: 30 g, Rfl: 1 .  TOUJEO SOLOSTAR 300 UNIT/ML SOPN, Inject 40 units in the morning and 35 units at night, Disp: , Rfl:  .  TRADJENTA 5 MG TABS tablet, TAKE ONE (1) TABLET EACH DAY, Disp: 90 tablet, Rfl: 0 .  ULTICARE MINI PEN NEEDLES 31G X 6 MM MISC, 4 TIMES DAILY, Disp: 100 each, Rfl: 1 .  Capsaicin (ICY HOT ARTHRITIS THERAPY EX), Apply 1 application topically 2 (two) times  daily as needed (back pain). , Disp: , Rfl:   Patient brings in all medications and the above is reviewed and updated.  He also shows me today which medications he takes each morning and takes his am doses in the office today.  Without assistance patient was able to demonstrate appropriate administration.    Current diabetic medications include: Toujeo 40 units qam and 35 units qpm.  Toujeo 52m 1 tablet daily and metformin XR 5025m1 tablet daily.  Patient states that he is able to take he ER/XR form of metformin without loose stools.  Humalog was discontinued about 1 month ago because patient was not taking a correct time and was having hypoglycemia during night.   He did not bring in glucometer to review but self reports BG - 150, 180, 88, 90.  Lowest 60  and Highest 200. He reports that he has not had hypoglycemia within the lat 30 days.   RBG in office today was 151.   Known diabetic complications: peripheral neuropathy and cardiovascular disease Cardiovascular risk factors: advanced age (older than 5533or men, 6555or women), diabetes mellitus, dyslipidemia, hypertension, male gender and CHF Current diet: improving.  He tries to avoid both salt and sugar.  Continues to eat lean proteins and more vegetables. (has a garden) Current exercise: walking - daily  Medication Compliance?  No   Is He on ACE inhibitor or angiotensin II receptor blocker?  No - lisinopril stopped during last hospitalization because patient is taking Entresto In July lisinopril refilled and patient started taking again.  Potassium increased.  Since then lisinopril was been discontinued again.    Objective:    BP 128/70   Pulse 80   Ht 5' 9"  (1.753 m)   Wt 214 lb (97.1 kg)   BMI 31.60 kg/m   Lab Review  Last A1c = 9.7% (09/10/2016) A1c = 10.1% (06/01/2016)  BMP Latest Ref Rng & Units 10/04/2016 09/21/2016 09/01/2016  Glucose 65 - 99 mg/dL 160(H) 165(H) 181(H)  BUN 8 - 27 mg/dL 26 43(H) 31(H)  Creatinine 0.76 - 1.27 mg/dL 1.31(H) 1.70(H) 1.47(H)  BUN/Creat Ratio 10 - 24 20 25(H) 21  Sodium 134 - 144 mmol/L 141 141 142  Potassium 3.5 - 5.2 mmol/L 4.9 4.6 5.3(H)  Chloride 96 - 106 mmol/L 107(H) 105 106  CO2 20 - 29 mmol/L 23 22 24   Calcium 8.6 - 10.2 mg/dL 8.1(L) 8.7 8.4(L)      Assessment:    Diabetes Mellitus type II requiring insulin therapy -  under improving control.  Medication Management Elevated serum creatinine and h/o elevated potassium  Plan:    1.  Rx changes: No changes recommended at this time.   Reviewed all meds and proper administration.  2.  Reviewed 'ABCs' of diabetes management (respective goals in parentheses):  A1C (<7), blood pressure (<130/80), and cholesterol (LDL <100). 3. Continue current CHO counting diet.  5.  Recommend  check BG 1-2 times a day 6.  Recommended continue daily walking -  goal is at least 150 minutes per week 7. Follow up: 6 weeks  with PCP  Orders Placed This Encounter  Procedures  . BMP8+EGFR

## 2016-10-04 NOTE — Patient Instructions (Signed)
Diabetes and Standards of Medical Care   Diabetes is complicated. You may find that your diabetes team includes a dietitian, nurse, diabetes educator, eye doctor, and more. To help everyone know what is going on and to help you get the care you deserve, the following schedule of care was developed to help keep you on track. Below are the tests, exams, vaccines, medicines, education, and plans you will need.  Blood Glucose Goals Prior to meals = 80 - 130 Within 2 hours of the start of a meal = less than 180  HbA1c test (goal is less than 7.0% - your last value was 9.7%) This test shows how well you have controlled your glucose over the past 2 to 3 months. It is used to see if your diabetes management plan needs to be adjusted.   It is performed at least 2 times a year if you are meeting treatment goals.  It is performed 4 times a year if therapy has changed or if you are not meeting treatment goals.  Blood pressure test  This test is performed at every routine medical visit. The goal is less than 140/90 mmHg for most people, but 130/80 mmHg in some cases. Ask your health care provider about your goal.  Dental exam  Follow up with the dentist regularly.  Eye exam  If you are diagnosed with type 1 diabetes as a child, get an exam upon reaching the age of 10 years or older and have had diabetes for 3 to 5 years. Yearly eye exams are recommended after that initial eye exam.  If you are diagnosed with type 1 diabetes as an adult, get an exam within 5 years of diagnosis and then yearly.  If you are diagnosed with type 2 diabetes, get an exam as soon as possible after the diagnosis and then yearly.  Foot care exam  Visual foot exams are performed at every routine medical visit. The exams check for cuts, injuries, or other problems with the feet.  A comprehensive foot exam should be done yearly. This includes visual inspection as well as assessing foot pulses and testing for loss of  sensation.  Check your feet nightly for cuts, injuries, or other problems with your feet. Tell your health care provider if anything is not healing.  Kidney function test (urine microalbumin)  This test is performed once a year.  Type 1 diabetes: The first test is performed 5 years after diagnosis.  Type 2 diabetes: The first test is performed at the time of diagnosis.  A serum creatinine and estimated glomerular filtration rate (eGFR) test is done once a year to assess the level of chronic kidney disease (CKD), if present.  Lipid profile (cholesterol, HDL, LDL, triglycerides)  Performed every 5 years for most people.  The goal for LDL is less than 100 mg/dL. If you are at high risk, the goal is less than 70 mg/dL.  The goal for HDL is 40 mg/dL to 50 mg/dL for men and 50 mg/dL to 60 mg/dL for women. An HDL cholesterol of 60 mg/dL or higher gives some protection against heart disease.  The goal for triglycerides is less than 150 mg/dL.  Influenza vaccine, pneumococcal vaccine, and hepatitis B vaccine  The influenza vaccine is recommended yearly.  The pneumococcal vaccine is generally given once in a lifetime. However, there are some instances when another vaccination is recommended. Check with your health care provider.  The hepatitis B vaccine is also recommended for adults with diabetes.    Diabetes self-management education  Education is recommended at diagnosis and ongoing as needed.  Treatment plan  Your treatment plan is reviewed at every medical visit.  Document Released: 12/05/2008 Document Revised: 10/10/2012 Document Reviewed: 07/10/2012 ExitCare Patient Information 2014 [employer].   

## 2016-10-04 NOTE — Patient Outreach (Signed)
[facility]) Care Management  10/04/2016  CHUCKIE MCCATHERN 1955/02/25 [id number]  Called patient to follow up with him on medication management.  HIPAA identifiers were obtained.  [facility], Tammy Eckard sent me an [employer] detailing the following changes:  -Humalog --previously discontinued and was removed from the patient's medication list  -Amlodipine-filled 09/07/16-patient should not be taking (has been removed from the med list)-patient understands he is not supposed to be taking  Patient's medications were reviewed today via telephone. Patient has an appointment with Cherre Robins today at noon. Patient was asked to take all of his medications to his appointment.  Plan:  Follow up with the patient in 5-7 business days.  Elayne Guerin, PharmD, [place] Clinical Pharmacist [phone]

## 2016-10-05 LAB — BMP8+EGFR
BUN/Creatinine Ratio: 20 (ref 10–24)
BUN: 26 mg/dL (ref 8–27)
CALCIUM: 8.1 mg/dL — AB (ref 8.6–10.2)
CHLORIDE: 107 mmol/L — AB (ref 96–106)
CO2: 23 mmol/L (ref 20–29)
Creatinine, Ser: 1.31 mg/dL — ABNORMAL HIGH (ref 0.76–1.27)
GFR, EST AFRICAN AMERICAN: 68 mL/min/{1.73_m2} (ref >59)
GFR, EST NON AFRICAN AMERICAN: 59 mL/min/{1.73_m2} — AB (ref >59)
Glucose: 160 mg/dL — ABNORMAL HIGH (ref 65–99)
Potassium: 4.9 mmol/L (ref 3.5–5.2)
Sodium: 141 mmol/L (ref 134–144)

## 2016-10-10 LAB — SPECIMEN STATUS REPORT

## 2016-10-10 LAB — CALCIUM, IONIZED

## 2016-10-11 ENCOUNTER — Other Ambulatory Visit: Payer: Self-pay | Admitting: *Deleted

## 2016-10-11 ENCOUNTER — Encounter: Payer: Self-pay | Admitting: *Deleted

## 2016-10-11 DIAGNOSIS — D509 Iron deficiency anemia, unspecified: Secondary | ICD-10-CM | POA: Diagnosis not present

## 2016-10-11 DIAGNOSIS — I10 Essential (primary) hypertension: Secondary | ICD-10-CM | POA: Diagnosis not present

## 2016-10-11 DIAGNOSIS — Z79899 Other long term (current) drug therapy: Secondary | ICD-10-CM | POA: Diagnosis not present

## 2016-10-11 DIAGNOSIS — E559 Vitamin D deficiency, unspecified: Secondary | ICD-10-CM | POA: Diagnosis not present

## 2016-10-11 DIAGNOSIS — N183 Chronic kidney disease, stage 3 (moderate): Secondary | ICD-10-CM | POA: Diagnosis not present

## 2016-10-11 DIAGNOSIS — R809 Proteinuria, unspecified: Secondary | ICD-10-CM | POA: Diagnosis not present

## 2016-10-11 NOTE — Patient Outreach (Signed)
Brad Mendoza   10/11/2016  Brad Mendoza 09/06/1955 [id number]  Brad Mendoza is an 61 y.o. male  Subjective: Routine home visit with pt,  HIPA verified, joint visit with [place],  Pt states he is checking CBG but not recording, is weighing but not everyday and not at same time everyday and not recording.  Pt states he has gained weight slowly over past month because " I'm eating more food, I have a good appetite"  Pt reports he had bloodwork done at MD office and "everything good"  Pt reports he has all medications and taking as prescribed.  Pt reports he has had issues with his roof due to all the rain and in process of getting this fixed.  Objective:   Vitals:   10/11/16 1138  BP: 112/68  Pulse: 80  Resp: 18  SpO2: 98%  Weight: 210 lb (95.3 kg)  CBG today 135 7 day average 171 14 day average 172 30 day average 171 CBG ranges 104-259  ROS  Physical Exam  Constitutional: He is oriented to person, place, and time. He appears well-developed and well-nourished.  HENT:  Head: Normocephalic.  Neck: Normal range of motion. Neck supple.  Cardiovascular: Normal rate.   Irregular rhythm  Respiratory: Effort normal and breath sounds normal.  GI: Soft. Bowel sounds are normal.  Musculoskeletal: Normal range of motion. He exhibits no edema.  Neurological: He is alert and oriented to person, place, and time.  Skin: Skin is warm and dry.  Psychiatric: He has a normal mood and affect. His behavior is normal. Judgment and thought content normal.    Encounter Medications:   Outpatient Encounter Prescriptions as of 10/11/2016  Medication Sig Note  . albuterol (PROVENTIL HFA;VENTOLIN HFA) 108 (90 Base) MCG/ACT inhaler Inhale 2 puffs into the lungs every 6 (six) hours as needed for wheezing or shortness of breath. 02/29/2016: Uses PRN  . Capsaicin (ICY HOT ARTHRITIS THERAPY EX) Apply 1 application topically 2 (two) times daily as needed (back  pain).    . carvedilol (COREG) 12.5 MG tablet TAKE ONE TABLET TWICE A DAY WITH FOOD   . fluticasone (FLONASE) 50 MCG/ACT nasal spray USE 2 SPRAYS IN EACH NOSTRIL DAILY   . furosemide (LASIX) 20 MG tablet Take 2-3 tablets (40-60 mg total) by mouth as directed. 60 mg (3 tabs) in the AM and 40 mg (2 tabs) in the PM   . gabapentin (NEURONTIN) 300 MG capsule Take 300-600 mg by mouth at bedtime as needed.   . isosorbide mononitrate (IMDUR) 60 MG 24 hr tablet TAKE 1 AND 1/2 TABLETS DAILY   . metFORMIN (GLUCOPHAGE-XR) 500 MG 24 hr tablet Take 1 tablet (500 mg total) by mouth daily with breakfast.   . Multiple Vitamins-Minerals (CENTRUM SILVER ADULT 50+ PO) Take 1 tablet by mouth daily.   . Omega-3 Fatty Acids (FISH OIL) 1000 MG CPDR Take 1 capsule by mouth daily.   . pantoprazole (PROTONIX) 40 MG tablet TAKE ONE (1) TABLET EACH DAY   . ranolazine (RANEXA) 500 MG 12 hr tablet Take 1 tablet (500 mg total) by mouth 2 (two) times daily.   . rosuvastatin (CRESTOR) 10 MG tablet TAKE ONE (1) TABLET EACH DAY   . sacubitril-valsartan (ENTRESTO) 97-103 MG Take 1 tablet by mouth 2 (two) times daily.   [name] spironolactone (ALDACTONE) 25 MG tablet TAKE 1/2 TABLET DAILY   . terbinafine (LAMISIL) 1 % cream Apply 1 application topically 2 (two) times daily. Apply  to both feet and between toes   . TOUJEO SOLOSTAR 300 UNIT/ML SOPN Inject 40 units in the morning and 35 units at night   . TRADJENTA 5 MG TABS tablet TAKE ONE (1) TABLET EACH DAY   . ULTICARE MINI PEN NEEDLES 31G X 6 MM MISC 4 TIMES DAILY   . aspirin EC 81 MG tablet Take 81 mg by mouth daily.   . nitroGLYCERIN (NITROSTAT) 0.4 MG SL tablet DISSOLVE 1 TAB UNDER TOUNGE FOR CHEST PAIN. MAY REPEAT EVERY 5 MINUTES FOR 3 DOSES. IF NO RELIEF CALL 911 OR GO TO ER (Patient not taking: Reported on 10/11/2016)    No facility-administered encounter medications on file as of 10/11/2016.     Functional Status:   In your present state of health, do you have any difficulty  performing the following activities: 09/26/2016 09/14/2016  Hearing? N N  Vision? N N  Difficulty concentrating or making decisions? N N  Walking or climbing stairs? N N  Dressing or bathing? N N  Doing errands, shopping? N N  Preparing Food and eating ? N N  Using the Toilet? N N  In the past six months, have you accidently leaked urine? N N  Do you have problems with loss of bowel control? N N  Managing your Medications? N N  Comment - pt reports no problems managing medications  Managing your Finances? N N  Housekeeping or managing your Housekeeping? N N  Some recent data might be hidden    Fall/Depression Screening:    Fall Risk  09/26/2016 09/14/2016 09/01/2016  Falls in the past year? No No No  Risk for fall due to : Medication side effect Medication side effect -   PHQ 2/9 Scores 09/26/2016 09/14/2016 09/01/2016 08/22/2016 07/07/2016 06/01/2016 05/30/2016  PHQ - 2 Score 0 0 0 0 0 0 0    Assessment:  RN CM reviewed carbohydrate modified diet, food choices, reviewed low salt food choices, reinforced CHF Mendoza.  RN CM observed medication bottles and reviewed with pt.  Pt is out of aspirin and nitroglycerin, RN CM sent in basket [facility] pharmacist [name] with update and medication pt is out of, pt states he is getting these medications today.  Pt weighed today fully dressed and has already eaten breakfast, pt weighs on carpet and hard floor and different times of the day so it is difficult to determine patient's pattern of weight gain, pt is also consuming more food, pt is asymptomatic otherwise such as dyspnea, edema, etc. RN CM reviewed action plan in depth and pt verbalizes that he knows to call MD for change in symptoms.  Emphasized importance of correct, consistent weights and recording.    THN CM Care Plan Problem One     Most Recent Value  Care Plan Problem One  Knowledge deficit related to CHF  Role Documenting the Problem One  Care [place] Coordinator  Care Plan for Problem One   Active  THN Long Term Goal   pt will verbalize better understanding of disease process CHF to facilitate better outcomes and avoid hospitalization within 90 days.  THN Long Term Goal Start Date  09/14/16  Interventions for Problem One Long Term Goal  Pt has not been weighing daily, RN CM encouraged pt to weigh daily and record, same time each day, before eating and weigh on hard surface  THN CM Short Term Goal #1   Pt will verbalize CHF zones within 30 days.  THN CM Short Term Goal #1  Start Date  10/11/16 [goal restarted]  Interventions for Short Term Goal #1  RN CM reviewed/ reinforced CHF zones/ action plan and when to call MD, who to call (cardiologist)  [facility] CM Short Term Goal #2   Pt will verbalize which MD to call (cardiologist) and telephone number for exacerbation of CHF within 30 ays.  THN CM Short Term Goal #2 Start Date  09/14/16  [facility] CM Short Term Goal #2 Met Date  10/11/16  Interventions for Short Term Goal #2  RN CM reiterated importance of calling correct MD for weight gain, edema, cough etc. and follow up with MD, ask pt to call RN CM if any issues contacting MD      Plan: follow up with home visit next month Assess weight, blood sugar Observe medication  [name] [facility], BSN [place] Coordinator [phone]

## 2016-10-13 ENCOUNTER — Other Ambulatory Visit: Payer: Self-pay | Admitting: Pharmacist

## 2016-10-13 NOTE — Patient Outreach (Signed)
[place] [facility]) Care Management  [place]   10/13/2016  Brad Mendoza 05-06-1955 [id number]  Subjective: Patient was called to follow up after his appointment with Clinical Pharamcist, Tammy Eckard.  Patient is a 61 year old male with multiple medical conditions including but not limited to:  CHF, hyperlipidemia, hypertension, diabetes and CAD.  (There were no medication changes at the appointment.)   Objective:  Patient Self-reported fasting blood glucose-153mg /dl  Encounter Medications: Outpatient Encounter Prescriptions as of 10/13/2016  Medication Sig Note  . albuterol (PROVENTIL HFA;VENTOLIN HFA) 108 (90 Base) MCG/ACT inhaler Inhale 2 puffs into the lungs every 6 (six) hours as needed for wheezing or shortness of breath. 02/29/2016: Uses PRN  . aspirin EC 81 MG tablet Take 81 mg by mouth daily.   . Capsaicin (ICY HOT ARTHRITIS THERAPY EX) Apply 1 application topically 2 (two) times daily as needed (back pain).    . carvedilol (COREG) 12.5 MG tablet TAKE ONE TABLET TWICE A DAY WITH FOOD   . fluticasone (FLONASE) 50 MCG/ACT nasal spray USE 2 SPRAYS IN EACH NOSTRIL DAILY   . furosemide (LASIX) 20 MG tablet Take 2-3 tablets (40-60 mg total) by mouth as directed. 60 mg (3 tabs) in the AM and 40 mg (2 tabs) in the PM   . gabapentin (NEURONTIN) 300 MG capsule Take 300-600 mg by mouth at bedtime as needed.   . isosorbide mononitrate (IMDUR) 60 MG 24 hr tablet TAKE 1 AND 1/2 TABLETS DAILY   . metFORMIN (GLUCOPHAGE-XR) 500 MG 24 hr tablet Take 1 tablet (500 mg total) by mouth daily with breakfast.   . Multiple Vitamins-Minerals (CENTRUM SILVER ADULT 50+ PO) Take 1 tablet by mouth daily.   . nitroGLYCERIN (NITROSTAT) 0.4 MG SL tablet DISSOLVE 1 TAB UNDER TOUNGE FOR CHEST PAIN. MAY REPEAT EVERY 5 MINUTES FOR 3 DOSES. IF NO RELIEF CALL 911 OR GO TO ER   . Omega-3 Fatty Acids (FISH OIL) 1000 MG CPDR Take 1 capsule by mouth daily.   . pantoprazole (PROTONIX) 40 MG tablet TAKE  ONE (1) TABLET EACH DAY   . ranolazine (RANEXA) 500 MG 12 hr tablet Take 1 tablet (500 mg total) by mouth 2 (two) times daily.   . rosuvastatin (CRESTOR) 10 MG tablet TAKE ONE (1) TABLET EACH DAY   . sacubitril-valsartan (ENTRESTO) 97-103 MG Take 1 tablet by mouth 2 (two) times daily.   [name] spironolactone (ALDACTONE) 25 MG tablet TAKE 1/2 TABLET DAILY   . terbinafine (LAMISIL) 1 % cream Apply 1 application topically 2 (two) times daily. Apply to both feet and between toes   . TOUJEO SOLOSTAR 300 UNIT/ML SOPN Inject 40 units in the morning and 35 units at night   . TRADJENTA 5 MG TABS tablet TAKE ONE (1) TABLET EACH DAY   . ULTICARE MINI PEN NEEDLES 31G X 6 MM MISC 4 TIMES DAILY    No facility-administered encounter medications on file as of 10/13/2016.     Functional Status: In your present state of health, do you have any difficulty performing the following activities: 09/26/2016 09/14/2016  Hearing? N N  Vision? N N  Difficulty concentrating or making decisions? N N  Walking or climbing stairs? N N  Dressing or bathing? N N  Doing errands, shopping? N N  Preparing Food and eating ? N N  Using the Toilet? N N  In the past six months, have you accidently leaked urine? N N  Do you have problems with loss of bowel  control? N N  Managing your Medications? N N  Comment - pt reports no problems managing medications  Managing your Finances? N N  Housekeeping or managing your Housekeeping? N N  Some recent data might be hidden    Fall/Depression Screening: Fall Risk  09/26/2016 09/14/2016 09/01/2016  Falls in the past year? No No No  Risk for fall due to : Medication side effect Medication side effect -   PHQ 2/9 Scores 09/26/2016 09/14/2016 09/01/2016 08/22/2016 07/07/2016 06/01/2016 05/30/2016  PHQ - 2 Score 0 0 0 0 0 0 0      Assessment: Patient reports taking his medications as instructed.  He also reported checking his blood sugar 2-3 times per day but has not been writing them down.  Patient  was asked to write down his blood sugars and to test in the morning, before a meal and then 2 hours after a meal.  Patient's diabetes is being managed with: Basal insulin-Toujeo 40 units in the morning and 35 in the evening Tradjenta 5mg  daily Metformin 500mg  XR daily (some room to optimize the dose if the patient can do so without GI side effects)  Plan: [facility] CM Care Plan Problem One     Most Recent Value  Care Plan Problem One  Uncontrolled Diabetes  Role Documenting the Problem One  Clinical Pharmacist  Care Plan for Problem One  Active  THN CM Short Term Goal #1   Decrease HgA1c by 1% by next PCP visit 12/02/16  THN CM Short Term Goal #1 Start Date  10/13/16  Interventions for Short Term Goal #1  Pharmacist reviewed blood glucose goals with the patient, hypoglycemia management, patient was encouraged to record his blood sugars     Follow up with the patient in 2 weeks.  Elayne Guerin, PharmD, [place] Clinical Pharmacist [phone]

## 2016-10-18 ENCOUNTER — Other Ambulatory Visit: Payer: Self-pay | Admitting: Licensed Clinical Social Worker

## 2016-10-18 DIAGNOSIS — N183 Chronic kidney disease, stage 3 (moderate): Secondary | ICD-10-CM | POA: Diagnosis not present

## 2016-10-18 DIAGNOSIS — R809 Proteinuria, unspecified: Secondary | ICD-10-CM | POA: Diagnosis not present

## 2016-10-18 DIAGNOSIS — E1129 Type 2 diabetes mellitus with other diabetic kidney complication: Secondary | ICD-10-CM | POA: Diagnosis not present

## 2016-10-18 DIAGNOSIS — I10 Essential (primary) hypertension: Secondary | ICD-10-CM | POA: Diagnosis not present

## 2016-10-18 NOTE — Patient Outreach (Signed)
Assessment:  CSW spoke via phone with client. CSW verified client identity. CSW and client spoke of client needs. Client sees Dr. Evette Doffing as primary care doctor. Client is receiving [facility] nursing support with RN [name]. Client is receiving [facility] pharmacy support with [facility] pharmacist, [name]. Client has said he does not have a car. Thus, he said he relies on family and friends to transport him to and from his scheduled medical appointment or to complete errands for client. CSW has talked with client about [place]. CSW has encouraged client to utilize transport assist services of [place]. CSW and client spoke of client care plan. CSW encouraged client to communicate with CSW in next 30 days about financial needs of client  and about financial resources for client in the area. Client receives a monthly Social Security Disability check. Client said he had an adequate food supply. He said he is sleeping well. He wears glasses to assist with vision of client. CSW and client spoke of client financial challenges. CSW encouraged client to communicate with financial counselor at [facility] to discuss setting up payment plan for client. CSW encouraged client to communicate with financial counselor at [facility] to discuss setting up payment plan for client. CSW also encouraged client to communicate with his daughter to see if daughter could assist him in managing client's current bills. He said he would call his daughter to see if she could help him review current bills and help him in organizing how to pay his current bills due.  Client said he was having some pain in his left leg and left foot. He said he had talked with Dr.[name] about these pain issues for client.  CSW encouraged client to call CSW or RN [name] to discuss current needs of client. CSW thanked [place] for phone call with CSW on 10/18/16. [place] was appreciative  of phone call from [place] on 10/18/16   Plan:  Client to communicate with CSW in next 30 days to discuss financial needs of client and to discuss financial resources for client in the area,  CSW to collaborate, as needed, with RN [name] in monitoring needs of client.  CSW to call client in 4 weeks to assess client needs at that time.  [name].Chisom Aust MSW, LCSW Licensed Clinical Social Worker [facility] Care Management [phone]

## 2016-10-20 ENCOUNTER — Telehealth (HOSPITAL_COMMUNITY): Payer: Self-pay

## 2016-10-20 NOTE — Telephone Encounter (Signed)
Pt called due to weight gain was 212 lbs in beginning of week but per instruction to lasix pt increased am dosage to 60 mg daily. Pt weight 8/29 was 210 lbs and this am 8/30 was 207 lbs. Instructed pt to continue to take take medicine as directed since medication appears to be effective and call back if pt. Has any weight gain.

## 2016-10-27 ENCOUNTER — Encounter: Payer: Self-pay | Admitting: Pharmacist

## 2016-10-27 ENCOUNTER — Other Ambulatory Visit: Payer: Self-pay | Admitting: Pharmacist

## 2016-10-27 NOTE — Patient Outreach (Signed)
[place] [facility]) Care Management  [place]   10/27/2016  WOLFE CAMARENA 1955-12-04 [id number]  Subjective: Patient was called to follow up on medication adherence and diabetes management. HIPAA identifiers were obtained.  Patient is a 61 year old male with multiple medical conditions including but not limited to: CHF, hyperlipidemia, hypertension, diabetes and CAD.    Objective:  Self reported blood glucose readings: Fasting: 10/24/16-196, 10/25/16-108, 10/26/16-124, 10/27/16-113  Averages (from meter) 7-day-155 14 day-158 30 day-161  Encounter Medications: Outpatient Encounter Prescriptions as of 10/27/2016  Medication Sig Note  . albuterol (PROVENTIL HFA;VENTOLIN HFA) 108 (90 Base) MCG/ACT inhaler Inhale 2 puffs into the lungs every 6 (six) hours as needed for wheezing or shortness of breath. 02/29/2016: Uses PRN  . aspirin EC 81 MG tablet Take 81 mg by mouth daily.   . Capsaicin (ICY HOT ARTHRITIS THERAPY EX) Apply 1 application topically 2 (two) times daily as needed (back pain).    . carvedilol (COREG) 12.5 MG tablet TAKE ONE TABLET TWICE A DAY WITH FOOD   . fluticasone (FLONASE) 50 MCG/ACT nasal spray USE 2 SPRAYS IN EACH NOSTRIL DAILY   . furosemide (LASIX) 20 MG tablet Take 2-3 tablets (40-60 mg total) by mouth as directed. 60 mg (3 tabs) in the AM and 40 mg (2 tabs) in the PM   . gabapentin (NEURONTIN) 300 MG capsule Take 300-600 mg by mouth at bedtime as needed.   . isosorbide mononitrate (IMDUR) 60 MG 24 hr tablet TAKE 1 AND 1/2 TABLETS DAILY   . metFORMIN (GLUCOPHAGE-XR) 500 MG 24 hr tablet Take 1 tablet (500 mg total) by mouth daily with breakfast.   . Multiple Vitamins-Minerals (CENTRUM SILVER ADULT 50+ PO) Take 1 tablet by mouth daily.   . nitroGLYCERIN (NITROSTAT) 0.4 MG SL tablet DISSOLVE 1 TAB UNDER TOUNGE FOR CHEST PAIN. MAY REPEAT EVERY 5 MINUTES FOR 3 DOSES. IF NO RELIEF CALL 911 OR GO TO ER   . Omega-3 Fatty Acids (FISH OIL) 1000 MG CPDR Take 1  capsule by mouth daily.   . pantoprazole (PROTONIX) 40 MG tablet TAKE ONE (1) TABLET EACH DAY   . ranolazine (RANEXA) 500 MG 12 hr tablet Take 1 tablet (500 mg total) by mouth 2 (two) times daily.   . rosuvastatin (CRESTOR) 10 MG tablet TAKE ONE (1) TABLET EACH DAY   . sacubitril-valsartan (ENTRESTO) 97-103 MG Take 1 tablet by mouth 2 (two) times daily.   [name] spironolactone (ALDACTONE) 25 MG tablet TAKE 1/2 TABLET DAILY   . terbinafine (LAMISIL) 1 % cream Apply 1 application topically 2 (two) times daily. Apply to both feet and between toes   . TOUJEO SOLOSTAR 300 UNIT/ML SOPN Inject 40 units in the morning and 35 units at night   . TRADJENTA 5 MG TABS tablet TAKE ONE (1) TABLET EACH DAY   . ULTICARE MINI PEN NEEDLES 31G X 6 MM MISC 4 TIMES DAILY    No facility-administered encounter medications on file as of 10/27/2016.     Functional Status: In your present state of health, do you have any difficulty performing the following activities: 09/26/2016 09/14/2016  Hearing? N N  Vision? N N  Difficulty concentrating or making decisions? N N  Walking or climbing stairs? N N  Dressing or bathing? N N  Doing errands, shopping? N N  Preparing Food and eating ? N N  Using the Toilet? N N  In the past six months, have you accidently leaked urine? N N  Do  you have problems with loss of bowel control? N N  Managing your Medications? N N  Comment - pt reports no problems managing medications  Managing your Finances? N N  Housekeeping or managing your Housekeeping? N N  Some recent data might be hidden    Fall/Depression Screening: Fall Risk  10/27/2016 09/26/2016 09/14/2016  Falls in the past year? No No No  Risk for fall due to : - Medication side effect Medication side effect   PHQ 2/9 Scores 09/26/2016 09/14/2016 09/01/2016 08/22/2016 07/07/2016 06/01/2016 05/30/2016  PHQ - 2 Score 0 0 0 0 0 0 0      Assessment: Patient's diabetes is being managed with: Basal insulin-Toujeo 40 units in the morning and  35 in the evening Tradjenta 5mg  daily Metformin 500mg  XR daily (some room to optimize the dose if the patient can do so without GI side effects)  Patient did not report any issues with his medications and feels much better about his diabetes control.  Plan: [facility] CM Care Plan Problem One     Most Recent Value  Care Plan Problem One  Uncontrolled Diabetes  Role Documenting the Problem One  Clinical Pharmacist  Care Plan for Problem One  Active  THN CM Short Term Goal #1   Decrease HgA1c by 1% by next PCP visit 12/02/16  THN CM Short Term Goal #1 Start Date  10/13/16  Interventions for Short Term Goal #1  Pharmacist reviewed blood glucose goals with the patient, hypoglycemia management, patient was encouraged to record his blood sugars [Reviewed blood glucose goals and reveiwed CMG readings.]     Patient's diabetes is being managed with: Basal insulin-Toujeo 40 units in the morning and 35 in the evening Tradjenta 5mg  daily Metformin 500mg  XR daily (some room to optimize the dose if the patient can do so without GI side effects)  Follow up with the patient in 2 weeks.   Elayne Guerin, PharmD, [place] Clinical Pharmacist [phone]

## 2016-11-08 ENCOUNTER — Other Ambulatory Visit: Payer: Self-pay | Admitting: *Deleted

## 2016-11-08 NOTE — Patient Outreach (Addendum)
RN CM drove to patient's home for scheduled routine home visit, no answer to locked door, RN CM called pt and pt states he is not at home, ask that RN CM call back and reschedule home visit. RN CM called pt back later and no answer to telephone and no option to leave voicemail. Pt called back and home visit rescheduled for next week.  [name] [facility], [place] Coordinator [phone]

## 2016-11-10 ENCOUNTER — Other Ambulatory Visit: Payer: Self-pay | Admitting: Pharmacist

## 2016-11-10 NOTE — Patient Outreach (Addendum)
[facility]) Care Management  11/10/2016  Brad Mendoza Jun 08, 1955 [id number]   Patient was called to follow up on diabetes and medication adherence. Unfortunately, the patient did not answer the phone and after ringing >20 times, a recording came on stating: "the person you are trying to call does not have voicemail set up".  (A message could not be left for the patient.)  Plan:  Call patient back in 3-5 business days.   Elayne Guerin, PharmD, [place] Clinical Pharmacist [phone]  ADDENDUM  Patient called back. HIPAA identifiers were obtained.  Patient is a 61 year old male with multiple medical conditions including but not limited to: CHF, hyperlipidemia, hypertension, diabetes and CAD.   Objective: Fasting AM blood sugar 11/10/16-86 7 day average per blood glucose meter-147mg /dl 14 days average per blood glucose meter-155 mg/dl Medications Reviewed Today    Reviewed by Elayne Guerin, [facility] (Pharmacist) on 10/27/16 at [place] List Status: <None>  Medication Order Taking? Sig Documenting Provider Last Dose Status Informant  albuterol (PROVENTIL HFA;VENTOLIN HFA) 108 (90 Base) MCG/ACT inhaler [id number] Yes Inhale 2 puffs into the lungs every 6 (six) hours as needed for wheezing or shortness of breath. [name], MD Taking Active Multiple Informants           Med Note Marylene Buerger Feb 29, 2016 11:09 AM) Uses PRN  aspirin EC 81 MG tablet [id number] Yes Take 81 mg by mouth daily. [provider] Taking Active Multiple Informants  Capsaicin (ICY HOT ARTHRITIS THERAPY EX) [id number] Yes Apply 1 application topically 2 (two) times daily as needed (back pain).  [provider] Taking Active Multiple Informants  carvedilol (COREG) 12.5 MG tablet [id number] Yes TAKE ONE TABLET TWICE A DAY WITH FOOD [name], MD Taking Active   fluticasone [facility]) 50 MCG/ACT nasal spray [id number] Yes USE 2 SPRAYS IN EACH NOSTRIL DAILY  [name], MD Taking Active   furosemide (LASIX) 20 MG tablet [id number] Yes Take 2-3 tablets (40-60 mg total) by mouth as directed. 60 mg (3 tabs) in the AM and 40 mg (2 tabs) in the PM Jettie Booze E, NP Taking Active   gabapentin (NEURONTIN) 300 MG capsule [id number] Yes Take 300-600 mg by mouth at bedtime as needed. [provider] Taking Active   isosorbide mononitrate (IMDUR) 60 MG 24 hr tablet [id number] Yes TAKE 1 AND 1/2 TABLETS DAILY [name], MD Taking Active   metFORMIN (GLUCOPHAGE-XR) 500 MG 24 hr tablet [id number] Yes Take 1 tablet (500 mg total) by mouth daily with breakfast. [name], MD Taking Active   Multiple Vitamins-Minerals (CENTRUM SILVER ADULT 50+ PO) [id number] Yes Take 1 tablet by mouth daily. [provider] Taking Active Multiple Informants  nitroGLYCERIN (NITROSTAT) 0.4 MG SL tablet [id number] Yes DISSOLVE 1 TAB UNDER TOUNGE FOR CHEST PAIN. MAY REPEAT EVERY 5 MINUTES FOR 3 DOSES. IF NO RELIEF CALL 911 OR GO TO ER [name], MD Taking Active Multiple Informants  Omega-3 Fatty Acids (FISH OIL) 1000 MG CPDR [id number] Yes Take 1 capsule by mouth daily. [provider] Taking Active Multiple Informants  pantoprazole (PROTONIX) 40 MG tablet [id number] Yes TAKE ONE (1) TABLET EACH DAY [name], MD Taking Active   ranolazine (RANEXA) 500 MG 12 hr tablet [id number] Yes Take 1 tablet (500 mg total) by mouth 2 (two) times daily. Larey Dresser, MD Taking Active   rosuvastatin (CRESTOR) 10 MG tablet [id number] Yes  TAKE ONE (1) TABLET EACH DAY [name], MD Taking Active   sacubitril-valsartan [facility]) 97-103 MG [id number] Yes Take 1 tablet by mouth 2 (two) times daily. Arbutus Leas, NP Taking Active   spironolactone (ALDACTONE) 25 MG tablet [id number] Yes TAKE 1/2 TABLET DAILY [name], MD Taking Active   terbinafine (LAMISIL) 1 % cream [id number] Yes Apply 1 application topically 2 (two) times daily. Apply to both  feet and between toes Cherre Robins, PharmD Taking Active Multiple Informants  TOUJEO SOLOSTAR 300 UNIT/ML SOPN [id number] Yes Inject 40 units in the morning and 35 units at night [provider] Taking Active   TRADJENTA 5 MG TABS tablet [id number] Yes TAKE ONE (1) TABLET EACH DAY [name], MD Taking Active   [facility] MINI PEN NEEDLES 31G X 6 MM MISC [id number] Yes Wilmore Chipper Herb, MD Taking Active Multiple Informants  Med List Note Lisette Abu 06/01/16 1848): Medications have been confirmed via pharmacy records         ASSESSMENT  Patient's reported blood sugars have been much better.  Patient did report one low blood sugar of 43 mg/dl.  He said he did not eat a large supper the day before but has not had any other lows.  Patient was re-educated on hypoglycemia management.  Plan: [facility] CM Care Plan Problem One     Most Recent Value  Care Plan Problem One  Uncontrolled Diabetes  Role Documenting the Problem One  Clinical Pharmacist  Care Plan for Problem One  Active  THN CM Short Term Goal #1   Decrease HgA1c by 1% by next PCP visit 12/02/16  THN CM Short Term Goal #1 Start Date  10/13/16  Interventions for Short Term Goal #1  Pharmacist reviewed blood glucose goals with the patient, hypoglycemia management, patient was encouraged to record his blood sugars [7 day blood glucose readings-147 14 day--155]     Follow up with the patient on 11/22/16.  He has an appointment with his PCP 12/02/16.   Elayne Guerin, PharmD, [place] Clinical Pharmacist [phone]

## 2016-11-14 ENCOUNTER — Ambulatory Visit: Payer: Self-pay | Admitting: Pharmacist

## 2016-11-14 ENCOUNTER — Ambulatory Visit: Payer: Self-pay | Admitting: *Deleted

## 2016-11-15 ENCOUNTER — Encounter: Payer: Self-pay | Admitting: *Deleted

## 2016-11-15 ENCOUNTER — Other Ambulatory Visit: Payer: Self-pay | Admitting: *Deleted

## 2016-11-15 NOTE — Patient Outreach (Signed)
[facility]) Care Management   11/15/2016  Brad Mendoza [birth year] [id number]  Brad Mendoza is an 61 y.o. male  Subjective: Routine home visit with pt, HIPAA verified, pt reports " doing pretty good"  Reports weight 198-200 pounds, "trying to do better with my diet, watching salt, sugar and grease"  Reports taking all medications as prescribed.    Objective:   Vitals:   11/15/16 1306  BP: 110/60  Pulse: 73  Resp: 18  SpO2: 99%  Weight: 200 lb (90.7 kg)  CBG log fasting and random readings in low 200's range 7 day average 133 14 day average 146 30 day average 153  ROS  Physical Exam  Constitutional: He is oriented to person, place, and time. He appears well-developed and well-nourished.  HENT:  Head: Normocephalic.  Neck: Normal range of motion. Neck supple.  Cardiovascular: Normal rate and regular rhythm.   Respiratory: Effort normal and breath sounds normal.  GI: Soft. Bowel sounds are normal.  Musculoskeletal: Normal range of motion. He exhibits no edema.  Neurological: He is alert and oriented to person, place, and time.  Skin: Skin is warm and dry.  Psychiatric: He has a normal mood and affect. His behavior is normal. Judgment and thought content normal.    Encounter Medications:   Outpatient Encounter Prescriptions as of 11/15/2016  Medication Sig Note  . albuterol (PROVENTIL HFA;VENTOLIN HFA) 108 (90 Base) MCG/ACT inhaler Inhale 2 puffs into the lungs every 6 (six) hours as needed for wheezing or shortness of breath. 02/29/2016: Uses PRN  . aspirin EC 81 MG tablet Take 81 mg by mouth daily.   . Capsaicin (ICY HOT ARTHRITIS THERAPY EX) Apply 1 application topically 2 (two) times daily as needed (back pain).    . carvedilol (COREG) 12.5 MG tablet TAKE ONE TABLET TWICE A DAY WITH FOOD   . fluticasone (FLONASE) 50 MCG/ACT nasal spray USE 2 SPRAYS IN EACH NOSTRIL DAILY   . furosemide (LASIX) 20 MG tablet Take 2-3 tablets (40-60 mg total) by mouth as  directed. 60 mg (3 tabs) in the AM and 40 mg (2 tabs) in the PM   . gabapentin (NEURONTIN) 300 MG capsule Take 300-600 mg by mouth at bedtime as needed.   . isosorbide mononitrate (IMDUR) 60 MG 24 hr tablet TAKE 1 AND 1/2 TABLETS DAILY   . metFORMIN (GLUCOPHAGE-XR) 500 MG 24 hr tablet Take 1 tablet (500 mg total) by mouth daily with breakfast.   . Multiple Vitamins-Minerals (CENTRUM SILVER ADULT 50+ PO) Take 1 tablet by mouth daily.   . nitroGLYCERIN (NITROSTAT) 0.4 MG SL tablet DISSOLVE 1 TAB UNDER TOUNGE FOR CHEST PAIN. MAY REPEAT EVERY 5 MINUTES FOR 3 DOSES. IF NO RELIEF CALL 911 OR GO TO ER   . Omega-3 Fatty Acids (FISH OIL) 1000 MG CPDR Take 1 capsule by mouth daily.   . pantoprazole (PROTONIX) 40 MG tablet TAKE ONE (1) TABLET EACH DAY   . ranolazine (RANEXA) 500 MG 12 hr tablet Take 1 tablet (500 mg total) by mouth 2 (two) times daily.   . rosuvastatin (CRESTOR) 10 MG tablet TAKE ONE (1) TABLET EACH DAY   . sacubitril-valsartan (ENTRESTO) 97-103 MG Take 1 tablet by mouth 2 (two) times daily.   [name] spironolactone (ALDACTONE) 25 MG tablet TAKE 1/2 TABLET DAILY   . terbinafine (LAMISIL) 1 % cream Apply 1 application topically 2 (two) times daily. Apply to both feet and between toes   . TOUJEO SOLOSTAR 300 UNIT/ML SOPN Inject  40 units in the morning and 35 units at night   . TRADJENTA 5 MG TABS tablet TAKE ONE (1) TABLET EACH DAY   . ULTICARE MINI PEN NEEDLES 31G X 6 MM MISC 4 TIMES DAILY    No facility-administered encounter medications on file as of 11/15/2016.     Functional Status:   In your present state of health, do you have any difficulty performing the following activities: 09/26/2016 09/14/2016  Hearing? N N  Vision? N N  Difficulty concentrating or making decisions? N N  Walking or climbing stairs? N N  Dressing or bathing? N N  Doing errands, shopping? N N  Preparing Food and eating ? N N  Using the Toilet? N N  In the past six months, have you accidently leaked urine? N N  Do  you have problems with loss of bowel control? N N  Managing your Medications? N N  Comment - pt reports no problems managing medications  Managing your Finances? N N  Housekeeping or managing your Housekeeping? N N  Some recent data might be hidden    Fall/Depression Screening:    Fall Risk  10/27/2016 09/26/2016 09/14/2016  Falls in the past year? No No No  Risk for fall due to : - Medication side effect Medication side effect   PHQ 2/9 Scores 09/26/2016 09/14/2016 09/01/2016 08/22/2016 07/07/2016 06/01/2016 05/30/2016  PHQ - 2 Score 0 0 0 0 0 0 0    Assessment:  Pt has improvement in CBG averages, continues to strive towards improving diet,  RN CM discussed discharge plan and pt requests another home visit next month for reinforcement, RN CM observed medication bottles and reviewed with pt,  RN CM faxed quarterly update to primary MD Dr. Assunta Found.  THN CM Care Plan Problem One     Most Recent Value  Care Plan Problem One  Knowledge deficit related to CHF  Role Documenting the Problem One  Care Management Coordinator  Care Plan for Problem One  Active  THN Long Term Goal   pt will verbalize better understanding of disease process CHF to facilitate better outcomes and avoid hospitalization within 90 days.  THN Long Term Goal Start Date  09/14/16  Interventions for Problem One Long Term Goal  Pt reports he is weighing daily but not recording, RN CM will continue to reinforce and monitor  THN CM Short Term Goal #1   Pt will verbalize CHF zones within 30 days.  THN CM Short Term Goal #1 Start Date  11/15/16 [goal restarted- needs reinforcement]  Interventions for Short Term Goal #1  RN CM reinforced CHF zones/ action plan and when to call MD, who to call (cardiologist)  [facility] CM Short Term Goal #2   Pt will verbalize which MD to call (cardiologist) and telephone number for exacerbation of CHF within 30 ays.  THN CM Short Term Goal #2 Start Date  09/14/16  [facility] CM Short Term Goal #2 Met Date  10/11/16       Plan: see pt for home visit next month Plan to discharge next month  [name] [facility], [place] Coordinator [phone]

## 2016-11-18 ENCOUNTER — Other Ambulatory Visit: Payer: Self-pay | Admitting: Licensed Clinical Social Worker

## 2016-11-18 NOTE — Patient Outreach (Signed)
Assessment:  CSW spoke via phone with client. CSW verified client identity. CSW and client spoke of client needs. Client sees Dr. Assunta Found as primary care doctor. Client said he had his prescribed medications and is taking medications as prescribed. Client is receiving [facility] nursing support with RN [name]. Client is receiving [facility] pharmacy support with [name]. Client does not have a car; thus, client relies on family and friends to transport him to and from his scheduled medical appointments or to complete errands needed. CSW has talked with client about [place]. CSW have encouraged client to utilize transport assistance service with [place]. CSW and client spoke of client care plan. CSW encouraged client to communicate with CSW in next 30 days to discuss financial needs of client and about financial resources for client in the area. Client said he is sleeping well. He said he had an adequate food supply.  CSW has encouraged client to contact financial counselor at [facility] to set up payment plan for client regarding client bill with [place] has encouraged cliient to contact financial counselor at [facility] to set up payment plan for client regrding client bill with [facility].  Client said he has been communicating with his daughter and seeking help from his daughter in managing financial needs of client.Client said he has appointment witih Dr. Evette Doffing next Tuesday.  Client said he is not taking a prescribed pain medication. Client said his roof needed some repairs.  CSW provided client with name and phone number of [facility] in [place], [place]. [place] encouraged client to call that agency and talk with Sharol Roussel about possible help with roof repairs for client home. CSW informed client that sometimes those requesting help from that agency had to go on  waiting list for that agency for home repairs to their home.  Client said he would call Sharol Roussel at agency to speak with her about roof repair needs of client. Client said he has his prescribed medications; he said he is paying his monthly bills each month.  But, he has ongoing financial challenges.  He said he appreciated support of [facility] program.  CSW thanked [place] for phone call with CSW on 11/18/16. CSW encouraged [place] to call CSW at [phone] as needed to discuss social work needs of client. [place] was appreciative of phone call from [place] on 11/18/16.  Plan:  Client to communicate with CSW in next 30 days to discuss financial needs of client and to discuss financial resources for client in the area.  CSW to collaborate with RN [name], as needed, in monitoring needs of client.  CSW to call client in 4 weeks to assess client needs at that time.  [name].Talaya Lamprecht MSW, LCSW Licensed Clinical Social Worker [facility] Care Management [phone]

## 2016-11-22 ENCOUNTER — Other Ambulatory Visit: Payer: Self-pay | Admitting: Pharmacist

## 2016-11-22 NOTE — Patient Outreach (Signed)
[facility]) Care Management  [place]   11/22/2016  Brad Mendoza [birth year] [id number]  Subjective: Patient was called to follow up on medication adherence to diabetes medications.HIPAA identifiers were obtained. Patient is a 61 year old male with multiple medical conditions including but not limited to: CHF, hyperlipidemia, hypertension, diabetes and CAD.    Objective:  Fasting blood sugar today 138mg /dl 7 day average-160mg /dl 14 day average-144 mg/dl 30 day average-150mg /dl    Encounter Medications: Outpatient Encounter Prescriptions as of 11/22/2016  Medication Sig Note  . albuterol (PROVENTIL HFA;VENTOLIN HFA) 108 (90 Base) MCG/ACT inhaler Inhale 2 puffs into the lungs every 6 (six) hours as needed for wheezing or shortness of breath. 02/29/2016: Uses PRN  . aspirin EC 81 MG tablet Take 81 mg by mouth daily.   . Capsaicin (ICY HOT ARTHRITIS THERAPY EX) Apply 1 application topically 2 (two) times daily as needed (back pain).    . carvedilol (COREG) 12.5 MG tablet TAKE ONE TABLET TWICE A DAY WITH FOOD   . fluticasone (FLONASE) 50 MCG/ACT nasal spray USE 2 SPRAYS IN EACH NOSTRIL DAILY   . furosemide (LASIX) 20 MG tablet Take 2-3 tablets (40-60 mg total) by mouth as directed. 60 mg (3 tabs) in the AM and 40 mg (2 tabs) in the PM   . gabapentin (NEURONTIN) 300 MG capsule Take 300-600 mg by mouth at bedtime as needed.   . isosorbide mononitrate (IMDUR) 60 MG 24 hr tablet TAKE 1 AND 1/2 TABLETS DAILY   . metFORMIN (GLUCOPHAGE-XR) 500 MG 24 hr tablet Take 1 tablet (500 mg total) by mouth daily with breakfast.   . Multiple Vitamins-Minerals (CENTRUM SILVER ADULT 50+ PO) Take 1 tablet by mouth daily.   . nitroGLYCERIN (NITROSTAT) 0.4 MG SL tablet DISSOLVE 1 TAB UNDER TOUNGE FOR CHEST PAIN. MAY REPEAT EVERY 5 MINUTES FOR 3 DOSES. IF NO RELIEF CALL 911 OR GO TO ER   . Omega-3 Fatty Acids (FISH OIL) 1000 MG CPDR Take 1 capsule by mouth daily.   . pantoprazole  (PROTONIX) 40 MG tablet TAKE ONE (1) TABLET EACH DAY   . ranolazine (RANEXA) 500 MG 12 hr tablet Take 1 tablet (500 mg total) by mouth 2 (two) times daily.   . rosuvastatin (CRESTOR) 10 MG tablet TAKE ONE (1) TABLET EACH DAY   . sacubitril-valsartan (ENTRESTO) 97-103 MG Take 1 tablet by mouth 2 (two) times daily.   [name] spironolactone (ALDACTONE) 25 MG tablet TAKE 1/2 TABLET DAILY   . terbinafine (LAMISIL) 1 % cream Apply 1 application topically 2 (two) times daily. Apply to both feet and between toes   . TOUJEO SOLOSTAR 300 UNIT/ML SOPN Inject 40 units in the morning and 35 units at night   . TRADJENTA 5 MG TABS tablet TAKE ONE (1) TABLET EACH DAY   . ULTICARE MINI PEN NEEDLES 31G X 6 MM MISC 4 TIMES DAILY    No facility-administered encounter medications on file as of 11/22/2016.     Functional Status: In your present state of health, do you have any difficulty performing the following activities: 09/26/2016 09/14/2016  Hearing? N N  Vision? N N  Difficulty concentrating or making decisions? N N  Walking or climbing stairs? N N  Dressing or bathing? N N  Doing errands, shopping? N N  Preparing Food and eating ? N N  Using the Toilet? N N  In the past six months, have you accidently leaked urine? N N  Do you have problems with  loss of bowel control? N N  Managing your Medications? N N  Comment - pt reports no problems managing medications  Managing your Finances? N N  Housekeeping or managing your Housekeeping? N N  Some recent data might be hidden    Fall/Depression Screening: Fall Risk  10/27/2016 09/26/2016 09/14/2016  Falls in the past year? No No No  Risk for fall due to : - Medication side effect Medication side effect   PHQ 2/9 Scores 09/26/2016 09/14/2016 09/01/2016 08/22/2016 07/07/2016 06/01/2016 05/30/2016  PHQ - 2 Score 0 0 0 0 0 0 0   Today's Vitals   11/22/16 1045  PainSc: 8      Assessment:  Patient reported taking all medications as prescribed. He checked his fasting blood  sugar while speaking with me on the phone. His blood sugar was 138mg /dl. Patient's 30 day average was reported to be:  150mg /dl.  Patient has an appointment with his PCP on 12/02/16. He was encouraged to continue to monitor his diet and walk when possible. He was also reminded to take all of his medications with him to his upcoming provider's visit.   Plan: [facility] CM Care Plan Problem One     Most Recent Value  Care Plan Problem One  Uncontrolled Diabetes  Role Documenting the Problem One  Clinical Pharmacist  Care Plan for Problem One  Active  THN CM Short Term Goal #1   Decrease HgA1c by 1% by next PCP visit 12/02/16  THN CM Short Term Goal #1 Start Date  10/13/16  Interventions for Short Term Goal #1  Pharmacist reviewed blood glucose goals with the patient, hypoglycemia management, patient was encouraged to record his blood sugars [7 day avg-160, 14 day 144 30 day 150]     Call patient after his MD visit    Elayne Guerin, PharmD, [place] Clinical Pharmacist [phone]

## 2016-11-26 ENCOUNTER — Other Ambulatory Visit: Payer: Self-pay | Admitting: Family Medicine

## 2016-11-26 ENCOUNTER — Other Ambulatory Visit: Payer: Self-pay | Admitting: Pediatrics

## 2016-11-28 ENCOUNTER — Other Ambulatory Visit: Payer: Self-pay | Admitting: Pharmacist

## 2016-11-28 ENCOUNTER — Telehealth (HOSPITAL_COMMUNITY): Payer: Self-pay | Admitting: *Deleted

## 2016-11-28 ENCOUNTER — Other Ambulatory Visit: Payer: Self-pay | Admitting: *Deleted

## 2016-11-28 ENCOUNTER — Telehealth: Payer: Self-pay | Admitting: Internal Medicine

## 2016-11-28 DIAGNOSIS — E118 Type 2 diabetes mellitus with unspecified complications: Secondary | ICD-10-CM

## 2016-11-28 DIAGNOSIS — Z794 Long term (current) use of insulin: Principal | ICD-10-CM

## 2016-11-28 MED ORDER — METFORMIN HCL ER 500 MG PO TB24: 500.0000 mg | ORAL_TABLET | Freq: Every day | ORAL | 0 refills | Status: DC

## 2016-11-28 NOTE — Patient Outreach (Signed)
[facility] pharmacist [name] called and reported pt gained weight and diuretic has been increased and pt has been in contact with MD office.  Telephone call to pt, spoke with pt, HIPAA verified, pt reports weight today 207 pounds " and ate salty food and bacon"  Pt states he contacted MD office and diuretic was increased today and pt awaiting call back from MD office about further instructions and office visit.  Pt reports " I have a little swelling, I was short of breath yesterday and I"m not today"  RN CM reviewed importance of adherence to low salt diet, praised and encouraged pt for weighing daily and following action plan and calling MD.  PLAN Contact pt tomorrow for follow up on weight/ MD appointment/ medication changes  [name] [facility], [place] Coordinator [phone]

## 2016-11-28 NOTE — Telephone Encounter (Signed)
Patient called that he is feeling sob when laying down. He was told to take extra 40 mg lasix right now and in the morning. If he continues to feel SOB, he should call us back or call 911 to go to the ED. He was also told to call in the AM to schedule an apt with the clinic.

## 2016-11-28 NOTE — Telephone Encounter (Signed)
Increase Lasix to 80 qam/60 qpm x 4 days then take 60 mg bid after that.  Needs followup in CHF clinic.

## 2016-11-28 NOTE — Telephone Encounter (Signed)
This has been addressed by Oda Kilts, PA in other phone note from today

## 2016-11-28 NOTE — Telephone Encounter (Signed)
Spoke w/pt he is agreeable, appt sch for 10/17 at 3:30 pt will let us know if wt does not come down, he states sob is already better.

## 2016-11-28 NOTE — Telephone Encounter (Signed)
Advanced Heart Failure Triage Encounter  Patient Name: Brad Mendoza  Date of Call: 11/28/16  Problem:  Pt called answering service this AM for increased SOB and wt gain and was instructed to take extra Lasix 40 mg.  He states he did that and then he took his normal dose of 60 mg earlier.  He states his SOB is better and edema is improved as well.  He states wt was 207 lb this AM, he does not wt daily but he states last time he weighed a few days ago it was 200 lb, he states it normally runs 199-200 lbs.  Will send to MD for review.  Plan:    Kevan Rosebush, RN

## 2016-11-28 NOTE — Telephone Encounter (Signed)
OK to do lasix 60 mg BID for a few days.  Needs follow up with labs. OK for this week or next.     Legrand Como [address] [place], PA-C 11/28/2016 11:27 AM

## 2016-11-28 NOTE — Patient Outreach (Addendum)
[facility]) Care Management  11/28/2016  Brad Mendoza 28-Feb-1955 [id number]   Patient called over the weekend with a question about his medications. Patient was called back. HIPAA identifiers were obtained.  Patient said he did not feel well. He reported he had to call the nurse line last night because when he laid down, he was not able to breathe. Patient said he was instructed to take 4 tablets of furosemide yesterday and to increase his furosemide dose to 5 tablets this morning and then call the provider's office. He reported his weight as 207.2 pounds today while we were speaking.  Patient's weight was 200 on 11/15/16.  When asked about his diet over the weekend. Patient said he fixed collard greens yesterday and put bacon in the greens. He also said he drank quite a bit of water yesterday but did not keep up with how much.  Patient was educated on decreasing his salt intake and to follow the fluid intake guidelines given to him by cardiology. In addition, patient was urged to call Cardiology as instructed by the nurse last night to make an appointment.  Patient's original reason for the call was that when he picked up his medications from his local pharmacy on Friday evening, potassium was filled.  Potassium is not on the patient's medication list as his potassium was >5.3.  Since potassium is not on his medication list, patient was instructed NOT to take potassium.  The patient's pharmacy,  [facility], was called and asked to void the potassium prescription.  Plan: Route note to [place] Nurse, [name] Follow up with patient at previously scheduled visit 12/06/16  Elayne Guerin, PharmD, [place] Clinical Pharmacist [phone]

## 2016-11-28 NOTE — Telephone Encounter (Signed)
Please forward to CHF clinic MCr

## 2016-11-29 ENCOUNTER — Other Ambulatory Visit: Payer: Self-pay | Admitting: *Deleted

## 2016-11-29 NOTE — Patient Outreach (Signed)
Telephone call to pt to follow up on weight, medication, spoke with pt, HIPAA verified, pt reports weight "still 207 pounds", no dyspnea, "swelling not bad".  Pt reports he took 3 tablets diuretic today and to call cardiologist office back for further instruction and awaiting call back from MD office, pt to see cardiologist 10/17 at 330 pm.  PLAN Follow up by telephone tomorrow, assess weight, ask about changes with diuretic  [name] [facility], [place] Coordinator [phone]

## 2016-11-30 ENCOUNTER — Other Ambulatory Visit: Payer: Self-pay | Admitting: *Deleted

## 2016-11-30 NOTE — Patient Outreach (Signed)
Telephone call to pt to follow up on weight, medication changes, no answer to telephone and received message stating " voicemail not set up"  PLAN Outreach pt tomorrow  [name] [facility], [place] Coordinator [phone]

## 2016-12-01 ENCOUNTER — Other Ambulatory Visit: Payer: Self-pay | Admitting: *Deleted

## 2016-12-01 NOTE — Patient Outreach (Signed)
Telephone call to patient to follow up on weight, spoke with pt, HIPAA verified, pt reports " I'm doing so much better, my weight has come down to 199 pounds"  Denies dyspnea, reports "fluid better too".  No further concerns reported and pt plans to see cardiologist for appointment next week.  PLAN See pt for home visit week of 12/12/16  [name] [facility], [place] Coordinator [phone]

## 2016-12-02 ENCOUNTER — Ambulatory Visit (INDEPENDENT_AMBULATORY_CARE_PROVIDER_SITE_OTHER): Payer: Medicare Other

## 2016-12-02 ENCOUNTER — Encounter: Payer: Self-pay | Admitting: Pediatrics

## 2016-12-02 ENCOUNTER — Ambulatory Visit (INDEPENDENT_AMBULATORY_CARE_PROVIDER_SITE_OTHER): Payer: Medicare Other | Admitting: Pediatrics

## 2016-12-02 VITALS — BP 134/85 | HR 90 | Temp 97.5°F | Ht 69.0 in | Wt 213.2 lb

## 2016-12-02 DIAGNOSIS — Z794 Long term (current) use of insulin: Secondary | ICD-10-CM | POA: Diagnosis not present

## 2016-12-02 DIAGNOSIS — M5442 Lumbago with sciatica, left side: Secondary | ICD-10-CM | POA: Diagnosis not present

## 2016-12-02 DIAGNOSIS — G8929 Other chronic pain: Secondary | ICD-10-CM | POA: Diagnosis not present

## 2016-12-02 DIAGNOSIS — I5042 Chronic combined systolic (congestive) and diastolic (congestive) heart failure: Secondary | ICD-10-CM

## 2016-12-02 DIAGNOSIS — M62838 Other muscle spasm: Secondary | ICD-10-CM | POA: Diagnosis not present

## 2016-12-02 DIAGNOSIS — E118 Type 2 diabetes mellitus with unspecified complications: Secondary | ICD-10-CM

## 2016-12-02 DIAGNOSIS — I2510 Atherosclerotic heart disease of native coronary artery without angina pectoris: Secondary | ICD-10-CM

## 2016-12-02 DIAGNOSIS — E7800 Pure hypercholesterolemia, unspecified: Secondary | ICD-10-CM | POA: Diagnosis not present

## 2016-12-02 DIAGNOSIS — I10 Essential (primary) hypertension: Secondary | ICD-10-CM

## 2016-12-02 DIAGNOSIS — K219 Gastro-esophageal reflux disease without esophagitis: Secondary | ICD-10-CM | POA: Diagnosis not present

## 2016-12-02 LAB — BAYER DCA HB A1C WAIVED: HB A1C: 6.7 % (ref <7.0)

## 2016-12-02 MED ORDER — CARVEDILOL 12.5 MG PO TABS: ORAL_TABLET | ORAL | 1 refills | Status: DC

## 2016-12-02 MED ORDER — METFORMIN HCL ER 500 MG PO TB24: 500.0000 mg | ORAL_TABLET | Freq: Every day | ORAL | 1 refills | Status: DC

## 2016-12-02 MED ORDER — SPIRONOLACTONE 25 MG PO TABS: 12.5000 mg | ORAL_TABLET | Freq: Every day | ORAL | 1 refills | Status: DC

## 2016-12-02 MED ORDER — TIZANIDINE HCL 2 MG PO TABS: 2.0000 mg | ORAL_TABLET | Freq: Two times a day (BID) | ORAL | 1 refills | Status: DC | PRN

## 2016-12-02 MED ORDER — PANTOPRAZOLE SODIUM 40 MG PO TBEC: DELAYED_RELEASE_TABLET | ORAL | 1 refills | Status: DC

## 2016-12-02 MED ORDER — ISOSORBIDE MONONITRATE ER 60 MG PO TB24: 90.0000 mg | ORAL_TABLET | Freq: Every day | ORAL | 1 refills | Status: DC

## 2016-12-02 MED ORDER — ROSUVASTATIN CALCIUM 10 MG PO TABS: ORAL_TABLET | ORAL | 1 refills | Status: DC

## 2016-12-02 MED ORDER — LINAGLIPTIN 5 MG PO TABS: ORAL_TABLET | ORAL | 1 refills | Status: DC

## 2016-12-02 NOTE — Patient Instructions (Addendum)
Food Choices for Gastroesophageal Reflux Disease, Adult When you have gastroesophageal reflux disease (GERD), the foods you eat and your eating habits are very important. Choosing the right foods can help ease your discomfort. What guidelines do I need to follow?  Choose fruits, vegetables, whole grains, and low-fat dairy products.  Choose low-fat meat, fish, and poultry.  Limit fats such as oils, salad dressings, butter, nuts, and avocado.  Keep a food diary. This helps you identify foods that cause symptoms.  Avoid foods that cause symptoms. These may be different for everyone.  Eat small meals often instead of 3 large meals a day.  Eat your meals slowly, in a place where you are relaxed.  Limit fried foods.  Cook foods using methods other than frying.  Avoid drinking alcohol.  Avoid drinking large amounts of liquids with your meals.  Avoid bending over or lying down until 2-3 hours after eating. What foods are not recommended? These are some foods and drinks that may make your symptoms worse: Vegetables Tomatoes. Tomato juice. Tomato and spaghetti sauce. Chili peppers. Onion and garlic. Horseradish. Fruits Oranges, grapefruit, and lemon (fruit and juice). Meats High-fat meats, fish, and poultry. This includes hot dogs, ribs, ham, sausage, salami, and bacon. Dairy Whole milk and chocolate milk. Sour cream. Cream. Butter. Ice cream. Cream cheese. Drinks Coffee and tea. Bubbly (carbonated) drinks or energy drinks. Condiments Hot sauce. Barbecue sauce. Sweets/Desserts Chocolate and cocoa. Donuts. Peppermint and spearmint. Fats and Oils High-fat foods. This includes [place] fries and potato chips. Other Vinegar. Strong spices. This includes black pepper, white pepper, red pepper, cayenne, curry powder, cloves, ginger, and chili powder. The items listed above may not be a complete list of foods and drinks to avoid. Contact your dietitian for more information. This  information is not intended to replace advice given to you by your health care provider. Make sure you discuss any questions you have with your health care provider. Document Released: 08/09/2011 Document Revised: 07/16/2015 Document Reviewed: 12/12/2012 Elsevier Interactive Patient Education  2017 [place].   Back Exercises If you have pain in your back, do these exercises 2-3 times each day or as told by your doctor. When the pain goes away, do the exercises once each day, but repeat the steps more times for each exercise (do more repetitions). If you do not have pain in your back, do these exercises once each day or as told by your doctor. Exercises Single Knee to Chest  Do these steps 3-5 times in a row for each leg: 1. Lie on your back on a firm bed or the floor with your legs stretched out. 2. Bring one knee to your chest. 3. Hold your knee to your chest by grabbing your knee or thigh. 4. Pull on your knee until you feel a gentle stretch in your lower back. 5. Keep doing the stretch for 10-30 seconds. 6. Slowly let go of your leg and straighten it.  Pelvic Tilt  Do these steps 5-10 times in a row: 1. Lie on your back on a firm bed or the floor with your legs stretched out. 2. Bend your knees so they point up to the ceiling. Your feet should be flat on the floor. 3. Tighten your lower belly (abdomen) muscles to press your lower back against the floor. This will make your tailbone point up to the ceiling instead of pointing down to your feet or the floor. 4. Stay in this position for 5-10 seconds while you gently tighten  your muscles and breathe evenly.  Cat-Cow  Do these steps until your lower back bends more easily: 1. Get on your hands and knees on a firm surface. Keep your hands under your shoulders, and keep your knees under your hips. You may put padding under your knees. 2. Let your head hang down, and make your tailbone point down to the floor so your lower back is  round like the back of a cat. 3. Stay in this position for 5 seconds. 4. Slowly lift your head and make your tailbone point up to the ceiling so your back hangs low (sags) like the back of a cow. 5. Stay in this position for 5 seconds.  Press-Ups  Do these steps 5-10 times in a row: 1. Lie on your belly (face-down) on the floor. 2. Place your hands near your head, about shoulder-width apart. 3. While you keep your back relaxed and keep your hips on the floor, slowly straighten your arms to raise the top half of your body and lift your shoulders. Do not use your back muscles. To make yourself more comfortable, you may change where you place your hands. 4. Stay in this position for 5 seconds. 5. Slowly return to lying flat on the floor.  Bridges  Do these steps 10 times in a row: 1. Lie on your back on a firm surface. 2. Bend your knees so they point up to the ceiling. Your feet should be flat on the floor. 3. Tighten your butt muscles and lift your butt off of the floor until your waist is almost as high as your knees. If you do not feel the muscles working in your butt and the back of your thighs, slide your feet 1-2 inches farther away from your butt. 4. Stay in this position for 3-5 seconds. 5. Slowly lower your butt to the floor, and let your butt muscles relax.  If this exercise is too easy, try doing it with your arms crossed over your chest. Back Lifts Do these steps 5-10 times in a row: 1. Lie on your belly (face-down) with your arms at your sides, and rest your forehead on the floor. 2. Tighten the muscles in your legs and your butt. 3. Slowly lift your chest off of the floor while you keep your hips on the floor. Keep the back of your head in line with the curve in your back. Look at the floor while you do this. 4. Stay in this position for 3-5 seconds. 5. Slowly lower your chest and your face to the floor.  Contact a doctor if:  Your back pain gets a lot worse when you do  an exercise.  Your back pain does not lessen 2 hours after you exercise. If you have any of these problems, stop doing the exercises. Do not do them again unless your doctor says it is okay. Get help right away if:  You have sudden, very bad back pain. If this happens, stop doing the exercises. Do not do them again unless your doctor says it is okay. This information is not intended to replace advice given to you by your health care provider. Make sure you discuss any questions you have with your health care provider. Document Released: 03/12/2010 Document Revised: 07/16/2015 Document Reviewed: 04/03/2014 Elsevier Interactive Patient Education  [employer].

## 2016-12-02 NOTE — Progress Notes (Signed)
Subjective:   Patient ID: Brad Mendoza, male    DOB: 02-Mar-1955, 61 y.o.   MRN: [id number] CC: Follow-up multiple med problems HPI: Brad Mendoza is a 61 y.o. male presenting for Follow-up  CHF: Some swelling a few days ago in LE Improved with a few days of increased diuretic from cardiology  No shortness of breath, no swelling today in LE  DM2: BGL 14 day average 155 Range from 80s-90s at low, to rarely over 200 usually in the evening Some lows to 30s-40s when he takes 35 u at night without eating a good dinner AM BGLs 100s-130s  Rarely eats sweets  Past couple of months having intermittent back pain, not every day Often bothering at night when he lays  Icy hot sometimes helps, past few days not helping as much Taking tylenol, helps some Had back injury 10 yrs ago, something popped in back when he was bending over at the time  Has been out weed eating more than usual  Relevant past medical, surgical, family and social history reviewed. Allergies and medications reviewed and updated. History  Smoking Status  . Never Smoker  Smokeless Tobacco  . Current User  . Types: Snuff    Comment: dips snuff about 6 cans/week for 3 years   ROS: Per HPI   Objective:    BP 134/85   Pulse 90   Temp (!) 97.5 F (36.4 C) (Oral)   Ht 5' 9"  (1.753 m)   Wt 213 lb 3.2 oz (96.7 kg)   BMI 31.48 kg/m   Wt Readings from Last 3 Encounters:  12/02/16 213 lb 3.2 oz (96.7 kg)  11/28/16 207 lb 3.2 oz (94 kg)  11/15/16 200 lb (90.7 kg)    Gen: NAD, alert, cooperative with exam, NCAT EYES: EOMI, no conjunctival injection, or no icterus ENT:  OP without erythema LYMPH: no cervical LAD CV: NRRR, normal S1/S2, no murmur, distal pulses 2+ b/l Resp: CTABL, no wheezes, normal WOB Abd: +BS, soft, NTND. no guarding  Ext: No edema, warm Neuro: Alert and oriented, strength equal b/l UE and LE, coordination grossly normal MSK: muscle spasm lower L paraspinal muscles No ttp over  spine  Assessment & Plan:  Brad Mendoza was seen today for follow-up multiple med problems  Diagnoses and all orders for this visit:  Essential hypertension Adequate control, cont current meds  -     CMP14+EGFR -     carvedilol (COREG) 12.5 MG tablet; TAKE ONE TABLET TWICE A DAY WITH FOOD -     isosorbide mononitrate (IMDUR) 60 MG 24 hr tablet; Take 1.5 tablets (90 mg total) by mouth daily.  Pure hypercholesterolemia Stable, cont current meds -     rosuvastatin (CRESTOR) 10 MG tablet; TAKE ONE (1) TABLET EACH DAY  Type 2 diabetes mellitus with complication, with long-term current use of insulin (HCC) Well controlled, a1c 6.7, decrease toujeo to 40u in am 15u at night -     Bayer DCA Hb A1c Waived -     CMP14+EGFR -     metFORMIN (GLUCOPHAGE-XR) 500 MG 24 hr tablet; Take 1 tablet (500 mg total) by mouth daily with breakfast. -     linagliptin (TRADJENTA) 5 MG TABS tablet; TAKE ONE (1) TABLET EACH DAY  Muscle spasm Trial of below, gentle back exercises -     tiZANidine (ZANAFLEX) 2 MG tablet; Take 1 tablet (2 mg total) by mouth 2 (two) times daily as needed for muscle spasms.  Chronic midline low back pain  with left-sided sciatica -     DG Lumbar Spine 2-3 Views; Future  Chronic combined systolic and diastolic heart failure (HCC) euvolemic today Cont to check daily morning weights -     spironolactone (ALDACTONE) 25 MG tablet; Take 0.5 tablets (12.5 mg total) by mouth daily.  Gastroesophageal reflux disease, esophagitis presence not specified Increased symptoms, take PPI before dinner, discussed small more frequent meals, staying upright after eating -     pantoprazole (PROTONIX) 40 MG tablet; TAKE ONE (1) TABLET EACH DAY   Follow up plan: Return in about 3 months (around 03/04/2017). Assunta Found, MD [place]

## 2016-12-04 LAB — CMP14+EGFR
A/G RATIO: 1.3 (ref 1.2–2.2)
ALBUMIN: 2.8 g/dL — AB (ref 3.6–4.8)
ALK PHOS: 74 IU/L (ref 39–117)
ALT: 22 IU/L (ref 0–44)
AST: 26 IU/L (ref 0–40)
BUN / CREAT RATIO: 18 (ref 10–24)
BUN: 35 mg/dL — AB (ref 8–27)
CO2: 25 mmol/L (ref 20–29)
CREATININE: 1.97 mg/dL — AB (ref 0.76–1.27)
Calcium: 8.5 mg/dL — ABNORMAL LOW (ref 8.6–10.2)
Chloride: 103 mmol/L (ref 96–106)
GFR calc Af Amer: 41 mL/min/{1.73_m2} — ABNORMAL LOW (ref >59)
GFR, EST NON AFRICAN AMERICAN: 36 mL/min/{1.73_m2} — AB (ref >59)
GLOBULIN, TOTAL: 2.1 g/dL (ref 1.5–4.5)
Glucose: 151 mg/dL — ABNORMAL HIGH (ref 65–99)
Potassium: 4.2 mmol/L (ref 3.5–5.2)
SODIUM: 140 mmol/L (ref 134–144)
Total Protein: 4.9 g/dL — ABNORMAL LOW (ref 6.0–8.5)

## 2016-12-05 ENCOUNTER — Other Ambulatory Visit: Payer: Self-pay

## 2016-12-06 ENCOUNTER — Other Ambulatory Visit: Payer: Self-pay | Admitting: Pharmacist

## 2016-12-06 MED ORDER — FUROSEMIDE 20 MG PO TABS: 40.0000 mg | ORAL_TABLET | ORAL | 3 refills | Status: DC

## 2016-12-06 NOTE — Patient Outreach (Signed)
[facility]) Care Management  [place]   12/06/2016  Brad Mendoza 09/06/1955 [id number]  Subjective: Patient was called to follow up on medication adherence to diabetes medications.HIPAA identifiers were obtained. Patient is a 61 year old male with multiple medical conditions including but not limited to: CHF, hyperlipidemia, hypertension, diabetes and CAD.   Objective:  FBG-132 Weight 198 pounds HgA1c from 12/02/16 office visit 6.7%   Encounter Medications: Outpatient Encounter Prescriptions as of 12/06/2016  Medication Sig Note  . albuterol (PROVENTIL HFA;VENTOLIN HFA) 108 (90 Base) MCG/ACT inhaler Inhale 2 puffs into the lungs every 6 (six) hours as needed for wheezing or shortness of breath. 02/29/2016: Uses PRN  . aspirin EC 81 MG tablet Take 81 mg by mouth daily.   . Capsaicin (ICY HOT ARTHRITIS THERAPY EX) Apply 1 application topically 2 (two) times daily as needed (back pain).    . carvedilol (COREG) 12.5 MG tablet TAKE ONE TABLET TWICE A DAY WITH FOOD   . fluticasone (FLONASE) 50 MCG/ACT nasal spray USE 2 SPRAYS IN EACH NOSTRIL DAILY   . furosemide (LASIX) 20 MG tablet Take 2-3 tablets (40-60 mg total) by mouth as directed. 60 mg (3 tabs) in the AM and 40 mg (2 tabs) in the PM   . gabapentin (NEURONTIN) 300 MG capsule Take 300-600 mg by mouth at bedtime as needed.   . isosorbide mononitrate (IMDUR) 60 MG 24 hr tablet Take 1.5 tablets (90 mg total) by mouth daily.   [name] linagliptin (TRADJENTA) 5 MG TABS tablet TAKE ONE (1) TABLET EACH DAY   . metFORMIN (GLUCOPHAGE-XR) 500 MG 24 hr tablet Take 1 tablet (500 mg total) by mouth daily with breakfast.   . Multiple Vitamins-Minerals (CENTRUM SILVER ADULT 50+ PO) Take 1 tablet by mouth daily.   . nitroGLYCERIN (NITROSTAT) 0.4 MG SL tablet DISSOLVE 1 TAB UNDER TOUNGE FOR CHEST PAIN. MAY REPEAT EVERY 5 MINUTES FOR 3 DOSES. IF NO RELIEF CALL 911 OR GO TO ER   . Omega-3 Fatty Acids (FISH OIL) 1000 MG CPDR Take 1  capsule by mouth daily.   . pantoprazole (PROTONIX) 40 MG tablet TAKE ONE (1) TABLET EACH DAY   . ranolazine (RANEXA) 500 MG 12 hr tablet Take 1 tablet (500 mg total) by mouth 2 (two) times daily.   . rosuvastatin (CRESTOR) 10 MG tablet TAKE ONE (1) TABLET EACH DAY   . sacubitril-valsartan (ENTRESTO) 97-103 MG Take 1 tablet by mouth 2 (two) times daily.   [name] spironolactone (ALDACTONE) 25 MG tablet Take 0.5 tablets (12.5 mg total) by mouth daily.   [name] terbinafine (LAMISIL) 1 % cream Apply 1 application topically 2 (two) times daily. Apply to both feet and between toes   . tiZANidine (ZANAFLEX) 2 MG tablet Take 1 tablet (2 mg total) by mouth 2 (two) times daily as needed for muscle spasms.   [name] 300 UNIT/ML SOPN Inject 40 units in the morning and 15 units at night   . ULTICARE MINI PEN NEEDLES 31G X 6 MM MISC 4 TIMES DAILY    No facility-administered encounter medications on file as of 12/06/2016.     Functional Status: In your present state of health, do you have any difficulty performing the following activities: 09/26/2016 09/14/2016  Hearing? N N  Vision? N N  Difficulty concentrating or making decisions? N N  Walking or climbing stairs? N N  Dressing or bathing? N N  Doing errands, shopping? N N  Preparing Food and eating ?  N N  Using the Toilet? N N  In the past six months, have you accidently leaked urine? N N  Do you have problems with loss of bowel control? N N  Managing your Medications? N N  Comment - pt reports no problems managing medications  Managing your Finances? N N  Housekeeping or managing your Housekeeping? N N  Some recent data might be hidden    Fall/Depression Screening: Fall Risk  12/02/2016 10/27/2016 09/26/2016  Falls in the past year? No No No  Risk for fall due to : - - Medication side effect   PHQ 2/9 Scores 12/02/2016 09/26/2016 09/14/2016 09/01/2016 08/22/2016 07/07/2016 06/01/2016  PHQ - 2 Score 0 0 0 0 0 0 0      Assessment:  Patient's  medications were reconciled via telephone. Patient had a PCP visit on 12/02/16 and his HgA1c decreased to 9.7% to .6.7%. Dr. Evette Doffing decreased the patient's Basal Insulin dose at bedtime.  Patient was congratulated on his excellent progress.  Pharmacy goal of decreasing the patient's HgA1c by one point was exceeded.    Plan:  Follow up with patient in 2-3 weeks to see how he is doing. Consider closing the pharmacy case

## 2016-12-07 ENCOUNTER — Ambulatory Visit (HOSPITAL_COMMUNITY)
Admission: RE | Admit: 2016-12-07 | Discharge: 2016-12-07 | Disposition: A | Discharge status: Home / Self Care | Payer: Medicare Other | Source: Ambulatory Visit | Attending: Cardiology | Admitting: Cardiology

## 2016-12-07 ENCOUNTER — Encounter: Payer: Self-pay | Admitting: *Deleted

## 2016-12-07 ENCOUNTER — Encounter (HOSPITAL_COMMUNITY): Payer: Self-pay

## 2016-12-07 VITALS — BP 110/65 | HR 86 | Wt 212.0 lb

## 2016-12-07 DIAGNOSIS — I5042 Chronic combined systolic (congestive) and diastolic (congestive) heart failure: Secondary | ICD-10-CM | POA: Diagnosis present

## 2016-12-07 DIAGNOSIS — E1122 Type 2 diabetes mellitus with diabetic chronic kidney disease: Secondary | ICD-10-CM | POA: Insufficient documentation

## 2016-12-07 DIAGNOSIS — Z79899 Other long term (current) drug therapy: Secondary | ICD-10-CM | POA: Diagnosis not present

## 2016-12-07 DIAGNOSIS — I429 Cardiomyopathy, unspecified: Secondary | ICD-10-CM | POA: Insufficient documentation

## 2016-12-07 DIAGNOSIS — Z716 Tobacco abuse counseling: Secondary | ICD-10-CM

## 2016-12-07 DIAGNOSIS — Z7984 Long term (current) use of oral hypoglycemic drugs: Secondary | ICD-10-CM | POA: Diagnosis not present

## 2016-12-07 DIAGNOSIS — E785 Hyperlipidemia, unspecified: Secondary | ICD-10-CM | POA: Diagnosis not present

## 2016-12-07 DIAGNOSIS — K219 Gastro-esophageal reflux disease without esophagitis: Secondary | ICD-10-CM | POA: Diagnosis not present

## 2016-12-07 DIAGNOSIS — E7800 Pure hypercholesterolemia, unspecified: Secondary | ICD-10-CM | POA: Diagnosis not present

## 2016-12-07 DIAGNOSIS — I130 Hypertensive heart and chronic kidney disease with heart failure and stage 1 through stage 4 chronic kidney disease, or unspecified chronic kidney disease: Secondary | ICD-10-CM | POA: Diagnosis not present

## 2016-12-07 DIAGNOSIS — Z006 Encounter for examination for normal comparison and control in clinical research program: Secondary | ICD-10-CM

## 2016-12-07 DIAGNOSIS — N183 Chronic kidney disease, stage 3 (moderate): Secondary | ICD-10-CM | POA: Insufficient documentation

## 2016-12-07 DIAGNOSIS — Z7982 Long term (current) use of aspirin: Secondary | ICD-10-CM | POA: Diagnosis not present

## 2016-12-07 DIAGNOSIS — Z880 Allergy status to penicillin: Secondary | ICD-10-CM | POA: Diagnosis not present

## 2016-12-07 DIAGNOSIS — I10 Essential (primary) hypertension: Secondary | ICD-10-CM

## 2016-12-07 DIAGNOSIS — F1010 Alcohol abuse, uncomplicated: Secondary | ICD-10-CM

## 2016-12-07 DIAGNOSIS — I2510 Atherosclerotic heart disease of native coronary artery without angina pectoris: Secondary | ICD-10-CM | POA: Insufficient documentation

## 2016-12-07 DIAGNOSIS — Z720 Tobacco use: Secondary | ICD-10-CM | POA: Insufficient documentation

## 2016-12-07 DIAGNOSIS — I25119 Atherosclerotic heart disease of native coronary artery with unspecified angina pectoris: Secondary | ICD-10-CM | POA: Diagnosis not present

## 2016-12-07 LAB — BASIC METABOLIC PANEL
ANION GAP: 7 (ref 5–15)
BUN: 45 mg/dL — ABNORMAL HIGH (ref 6–20)
CO2: 26 mmol/L (ref 22–32)
Calcium: 8.5 mg/dL — ABNORMAL LOW (ref 8.9–10.3)
Chloride: 106 mmol/L (ref 101–111)
Creatinine, Ser: 1.93 mg/dL — ABNORMAL HIGH (ref 0.61–1.24)
GFR calc Af Amer: 42 mL/min — ABNORMAL LOW (ref >60)
GFR, EST NON AFRICAN AMERICAN: 36 mL/min — AB (ref >60)
GLUCOSE: 132 mg/dL — AB (ref 65–99)
POTASSIUM: 4.4 mmol/L (ref 3.5–5.1)
SODIUM: 139 mmol/L (ref 135–145)

## 2016-12-07 MED ORDER — SPIRONOLACTONE 25 MG PO TABS
25.0000 mg | ORAL_TABLET | Freq: Every day | ORAL | 3 refills | Status: DC
Start: 2016-12-07 — End: 2016-12-23

## 2016-12-07 NOTE — Progress Notes (Signed)
Advanced Heart Failure Clinic Note   Primary Care: Dr. Alain Honey Primary Cardiologist: [place], Dr. Aundra Dubin   HPI: [name] is a 61 year old male with a past medical history of chronic combined systolic and diastolic CHF last EF 93-73% (Dec. 2017), HTN, HLD, DM and CAD. Also with history of NICM, left heart cath in June 2005 with 40-50% mid LAD stenosis, EF 45% at that time.    Admitted on 06/01/16 with chest pain relieved with nitroglycerin and volume overload. Troponin negative. Admission weight was 230 pounds. Right and left heart cath on 06/03/16 with 70-75% stenosis in the proximal and mid LAD,90% ostial stenosis in the 1st diagonal, and  60-70% stenosis in the mid RCA. RHC showed Fick C.O. 5.86/index 2.74. PA mean 33 mm Hg, LVEDP 25 mm Hg. EF by cath appears to be <25%. Medical therapy was recommended in the absence of ACS, and PCI was felt to be high risk with severe cardiomyopathy. CVTS was consulted and felt that CABG would not benefit him as he had NICM prior to his CAD. Discharge weight 207 pounds.   He presents today for regular follow up.  Has had frequent adjustments to his diuretic.  He is down 4 lbs since last visit (In July). Feeling good overall. Denies SOB. Primary complaint is fatigue. Weight down since last visit. Has been watching salt and fluid, but still gets into potato chips from time to time. Has stopped ETOH. He does occasionally get lightheadedness with rapid standing but not marked or limiting.   Review of systems complete and found to be negative unless listed in HPI.    Past Medical History:  Diagnosis Date  . Acid reflux   . Back pain    f4 and f5  . CAD (coronary artery disease)   . CHF (congestive heart failure) ([place])    last echo was in March 2016  . Diabetes mellitus 2005  . Diverticulosis   . H/O chest pain 2005   ECHO  . HTN (hypertension)   . Hyperlipidemia   . Nonischemic cardiomyopathy ([place])   . Onychomycosis   . Polysubstance abuse [facility])      Current Outpatient Prescriptions  Medication Sig Dispense Refill  . albuterol (PROVENTIL HFA;VENTOLIN HFA) 108 (90 Base) MCG/ACT inhaler Inhale 2 puffs into the lungs every 6 (six) hours as needed for wheezing or shortness of breath. 1 Inhaler 2  . aspirin EC 81 MG tablet Take 81 mg by mouth daily.    . Capsaicin (ICY HOT ARTHRITIS THERAPY EX) Apply 1 application topically 2 (two) times daily as needed (back pain).     . carvedilol (COREG) 12.5 MG tablet TAKE ONE TABLET TWICE A DAY WITH FOOD 180 tablet 1  . fluticasone (FLONASE) 50 MCG/ACT nasal spray USE 2 SPRAYS IN EACH NOSTRIL DAILY 48 g 1  . furosemide (LASIX) 20 MG tablet Take 2-3 tablets (40-60 mg total) by mouth as directed. 60 mg (3 tabs) in the AM and 40 mg (2 tabs) in the PM 150 tablet 3  . gabapentin (NEURONTIN) 300 MG capsule Take 300-600 mg by mouth at bedtime as needed.    . isosorbide mononitrate (IMDUR) 60 MG 24 hr tablet Take 1.5 tablets (90 mg total) by mouth daily. 135 tablet 1  . linagliptin (TRADJENTA) 5 MG TABS tablet TAKE ONE (1) TABLET EACH DAY 90 tablet 1  . metFORMIN (GLUCOPHAGE-XR) 500 MG 24 hr tablet Take 1 tablet (500 mg total) by mouth daily with breakfast. 90 tablet 1  .  Multiple Vitamins-Minerals (CENTRUM SILVER ADULT 50+ PO) Take 1 tablet by mouth daily.    . nitroGLYCERIN (NITROSTAT) 0.4 MG SL tablet DISSOLVE 1 TAB UNDER TOUNGE FOR CHEST PAIN. MAY REPEAT EVERY 5 MINUTES FOR 3 DOSES. IF NO RELIEF CALL 911 OR GO TO ER 25 tablet 2  . Omega-3 Fatty Acids (FISH OIL) 1000 MG CPDR Take 1 capsule by mouth daily.    . pantoprazole (PROTONIX) 40 MG tablet TAKE ONE (1) TABLET EACH DAY 90 tablet 1  . ranolazine (RANEXA) 500 MG 12 hr tablet Take 1 tablet (500 mg total) by mouth 2 (two) times daily. 60 tablet 6  . rosuvastatin (CRESTOR) 10 MG tablet TAKE ONE (1) TABLET EACH DAY 90 tablet 1  . sacubitril-valsartan (ENTRESTO) 97-103 MG Take 1 tablet by mouth 2 (two) times daily. 180 tablet 3  . spironolactone (ALDACTONE) 25  MG tablet Take 0.5 tablets (12.5 mg total) by mouth daily. 45 tablet 1  . terbinafine (LAMISIL) 1 % cream Apply 1 application topically 2 (two) times daily. Apply to both feet and between toes 30 g 1  . tiZANidine (ZANAFLEX) 2 MG tablet Take 1 tablet (2 mg total) by mouth 2 (two) times daily as needed for muscle spasms. 15 tablet 1  . TOUJEO SOLOSTAR 300 UNIT/ML SOPN Inject 40 units in the morning and 15 units at night    . ULTICARE MINI PEN NEEDLES 31G X 6 MM MISC 4 TIMES DAILY 100 each 2   No current facility-administered medications for this encounter.     Allergies  Allergen Reactions  . Sulfa Antibiotics Shortness Of Breath  . Penicillins Rash    Has patient had a PCN reaction causing immediate rash, facial/tongue/throat swelling, SOB or lightheadedness with hypotension: No Has patient had a PCN reaction causing severe rash involving mucus membranes or skin necrosis: No Has patient had a PCN reaction that required hospitalization No Has patient had a PCN reaction occurring within the last 10 years: No If all of the above answers are "NO", then may proceed with Cephalosporin use.       Social History   Social History  . Marital status: Married    Spouse name: N/A  . Number of children: N/A  . Years of education: N/A   Occupational History  . disabled    Social History Main Topics  . Smoking status: Never Smoker  . Smokeless tobacco: Current User    Types: Snuff     Comment: dips snuff about 6 cans/week for 3 years  . Alcohol use 0.6 oz/week    1 Cans of beer per week     Comment: occ - has cut down, now once every 2-3 months  . Drug use: Yes    Types: Marijuana     Comment: occasional use - smoking less  . Sexual activity: Yes   Other Topics Concern  . Not on file   Social History Narrative  . No narrative on file      Family History  Problem Relation Age of Onset  . Hypertension Father   . Diabetes Father   . Cancer Mother   . Alcohol abuse Brother   .  Colon cancer Neg Hx     Vitals:   12/07/16 1524  BP: 110/65  Pulse: 86  SpO2: 98%  Weight: 212 lb (96.2 kg)   Wt Readings from Last 3 Encounters:  12/07/16 212 lb (96.2 kg)  12/06/16 198 lb (89.8 kg)  12/02/16 213 lb 3.2 oz (96.7  kg)   PHYSICAL EXAM: General: Well appearing. No resp difficulty. HEENT: Normal Neck: Supple. JVP 5-6. Carotids 2+ bilat; no bruits. No thyromegaly or nodule noted. Cor: PMI nondisplaced. RRR, No M/G/R noted Lungs: CTAB, normal effort. Abdomen: Soft, non-tender, non-distended, no HSM. No bruits or masses. +BS  Extremities: No cyanosis, clubbing, or rash. R and LLE no edema.  Neuro: Alert & orientedx3, cranial nerves grossly intact. moves all 4 extremities w/o difficulty. Affect pleasant   ASSESSMENT & PLAN: 1. 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 He had preexisting NICM diagnosed in 2005 when his EF was 35% and had normal cors. Echo in 05/2016 with EF 30-35% and cath showed  70-75% stenosis in the proximal and mid LAD,90% ostial stenosis in the 1st diagonal, and  60-70% stenosis in the mid RCA. [place] 05/2016-  Fick C.O. 5.86/index 2.74 - NYHA II - Volume status stable on exam - Continue lasix 60 mg q am and 40 mg q pm.  - Continue Entresto 97/103 mg BID.  - Continue Coreg 12.5 mg BID - Continue isosorbide 90 mg daily.  - Increase spiro to 25 mg daily. BMET today and repeat at PCP 10 days.   2. CAD - left heart cath findings Above.  - Medical management recommended. - PCI to LAD felt to be high risk in the setting of cardiomyopathy, CVTS consulted felt that CABG would not improve EF as he has pre existing CM.  - No s/s of ischemia.    - Continue isosorbide and Ranexa - Continue ASA and statin.   3. ETOH abuse - Congratulated on abstinence.   4. Tobacco abuse - Chews tobacco, Encouraged complete cessation.   5. HTN - Well controlled on meds as above.   6. CKD stage III - BMET today. Repeat 10 days with Arlyce Harman increase.   7. DM - Follows  with PCP. No change.   Doing well overall. Meds as above. Labs today and 10 days with spiro increase. RTC 3 months with Echo.  Pt to be considered for BEAT-HF study.   Shirley Friar, PA-C 12/07/16   Greater than 50% of the 25 minute visit was spent in counseling/coordination of care regarding disease state educations, sliding scale diuretics, salt/fluid restriction, and medication reconciliation.

## 2016-12-07 NOTE — Patient Instructions (Signed)
Routine lab work today. Will notify you of abnormal results, otherwise no news is good news!  INCREASE Spironolactone to 25 mg (1 whole tablet) once daily.  Have labs repeated at preferred location in 1-2 weeks. Take Rx paper with you for this appointment.  Follow up 3 months with Dr. Aundra Dubin and echocardiogram. We will call you closer to this time, or you may call our office to schedule 1 month before you are due to be seen. Take all medication as prescribed the day of your appointment. Bring all medications with you to your appointment.  Do the following things EVERYDAY: 1) Weigh yourself in the morning before breakfast. Write it down and keep it in a log. 2) Take your medicines as prescribed 3) Eat low salt foods-Limit salt (sodium) to 2000 mg per day.  4) Stay as active as you can everyday 5) Limit all fluids for the day to less than 2 liters

## 2016-12-07 NOTE — Progress Notes (Signed)
Research Encounter  Subject screened for BeAT-HF. Study discussed with the subject and study video viewed.  Questions answered.  No procedures were performed at this time.  Consent form given to subject along with [place] information pamphlet.  Subject would like to review the information with his family.  The subject understands the study is to show whether the [place] is safe and works in people with heart failure. The system is a device that is designed to stimulate the body's natural cardiovascular regulators called baroreceptors.  He will await a call on Monday from Research RN regarding questions and/or concerns with the study.

## 2016-12-08 ENCOUNTER — Telehealth: Payer: Self-pay | Admitting: Pediatrics

## 2016-12-09 NOTE — Telephone Encounter (Signed)
Called and discussed with patient

## 2016-12-12 ENCOUNTER — Telehealth: Payer: Self-pay | Admitting: *Deleted

## 2016-12-12 NOTE — Telephone Encounter (Signed)
Research Encounter  Contacted patient to follow up on discussion of BeAT HF Research Study.  Patient was unable to review this weekend, but plans on having a family member over to his house tomorrow to discuss the study.  Will follow up with patient tomorrow.

## 2016-12-13 ENCOUNTER — Encounter: Payer: Self-pay | Admitting: *Deleted

## 2016-12-13 ENCOUNTER — Other Ambulatory Visit: Payer: Self-pay | Admitting: *Deleted

## 2016-12-13 VITALS — BP 116/62 | HR 76 | Resp 18 | Wt 198.0 lb

## 2016-12-13 DIAGNOSIS — I509 Heart failure, unspecified: Secondary | ICD-10-CM

## 2016-12-13 NOTE — Patient Outreach (Signed)
[facility]) Care Management   12/13/2016  Brad Mendoza [id number]  Brad Mendoza is an 61 y.o. male  Subjective: Routine home visit with pt, HIPAA verified, pt reports " I'm doing better"  Pt states he has all medications and taking as prescribed, reports [facility] social worker gave him resources to get roof repaired and states " there's a waiting list"  Pt states he "is working on some other resources for my roof so hopefully get if fixed soon"   Objective:   Vitals:   12/13/16 1253  BP: 116/62  Pulse: 76  Resp: 18  SpO2: 95%  Weight: 198 lb (89.8 kg)  CBG 98 CBG ranges from log 96-181 ROS  Physical Exam  Constitutional: He is oriented to person, place, and time. He appears well-developed and well-nourished.  HENT:  Head: Normocephalic.  Neck: Normal range of motion. Neck supple.  Cardiovascular: Normal rate and regular rhythm.   Respiratory: Effort normal and breath sounds normal.  GI: Soft. Bowel sounds are normal.  Musculoskeletal: Normal range of motion. He exhibits no edema.  Neurological: He is alert and oriented to person, place, and time.  Skin: Skin is warm and dry.  Psychiatric: He has a normal mood and affect. His behavior is normal. Judgment and thought content normal.    Encounter Medications:   Outpatient Encounter Prescriptions as of 12/13/2016  Medication Sig Note  . albuterol (PROVENTIL HFA;VENTOLIN HFA) 108 (90 Base) MCG/ACT inhaler Inhale 2 puffs into the lungs every 6 (six) hours as needed for wheezing or shortness of breath. 02/29/2016: Uses PRN  . aspirin EC 81 MG tablet Take 81 mg by mouth daily.   . Capsaicin (ICY HOT ARTHRITIS THERAPY EX) Apply 1 application topically 2 (two) times daily as needed (back pain).    . carvedilol (COREG) 12.5 MG tablet TAKE ONE TABLET TWICE A DAY WITH FOOD   . fluticasone (FLONASE) 50 MCG/ACT nasal spray USE 2 SPRAYS IN EACH NOSTRIL DAILY   . furosemide (LASIX) 20 MG tablet Take 2-3 tablets  (40-60 mg total) by mouth as directed. 60 mg (3 tabs) in the AM and 40 mg (2 tabs) in the PM   . isosorbide mononitrate (IMDUR) 60 MG 24 hr tablet Take 1.5 tablets (90 mg total) by mouth daily.   [name] linagliptin (TRADJENTA) 5 MG TABS tablet TAKE ONE (1) TABLET EACH DAY   . metFORMIN (GLUCOPHAGE-XR) 500 MG 24 hr tablet Take 1 tablet (500 mg total) by mouth daily with breakfast.   . Multiple Vitamins-Minerals (CENTRUM SILVER ADULT 50+ PO) Take 1 tablet by mouth daily.   . nitroGLYCERIN (NITROSTAT) 0.4 MG SL tablet DISSOLVE 1 TAB UNDER TOUNGE FOR CHEST PAIN. MAY REPEAT EVERY 5 MINUTES FOR 3 DOSES. IF NO RELIEF CALL 911 OR GO TO ER   . Omega-3 Fatty Acids (FISH OIL) 1000 MG CPDR Take 1 capsule by mouth daily.   . pantoprazole (PROTONIX) 40 MG tablet TAKE ONE (1) TABLET EACH DAY   . ranolazine (RANEXA) 500 MG 12 hr tablet Take 1 tablet (500 mg total) by mouth 2 (two) times daily.   . rosuvastatin (CRESTOR) 10 MG tablet TAKE ONE (1) TABLET EACH DAY   . sacubitril-valsartan (ENTRESTO) 97-103 MG Take 1 tablet by mouth 2 (two) times daily.   [name] spironolactone (ALDACTONE) 25 MG tablet Take 1 tablet (25 mg total) by mouth daily.   [name] terbinafine (LAMISIL) 1 % cream Apply 1 application topically 2 (two) times daily. Apply to both  feet and between toes   . tiZANidine (ZANAFLEX) 2 MG tablet Take 1 tablet (2 mg total) by mouth 2 (two) times daily as needed for muscle spasms.   Brad Mendoza 300 UNIT/ML SOPN Inject 40 units in the morning and 15 units at night   . ULTICARE MINI PEN NEEDLES 31G X 6 MM MISC 4 TIMES DAILY   . gabapentin (NEURONTIN) 300 MG capsule Take 300-600 mg by mouth at bedtime as needed.    No facility-administered encounter medications on file as of 12/13/2016.     Functional Status:   In your present state of health, do you have any difficulty performing the following activities: 09/26/2016 09/14/2016  Hearing? N N  Vision? N N  Difficulty concentrating or making decisions? N N  Walking  or climbing stairs? N N  Dressing or bathing? N N  Doing errands, shopping? N N  Preparing Food and eating ? N N  Using the Toilet? N N  In the past six months, have you accidently leaked urine? N N  Do you have problems with loss of bowel control? N N  Managing your Medications? N N  Comment - pt reports no problems managing medications  Managing your Finances? N N  Housekeeping or managing your Housekeeping? N N  Some recent data might be hidden    Fall/Depression Screening:    Fall Risk  12/13/2016 12/02/2016 10/27/2016  Falls in the past year? No No No  Risk for fall due to : Medication side effect - -   PHQ 2/9 Scores 12/02/2016 09/26/2016 09/14/2016 09/01/2016 08/22/2016 07/07/2016 06/01/2016  PHQ - 2 Score 0 0 0 0 0 0 0    Assessment:  RN CM observed and reviewed all medications with pt, pt took his last ranexa yesterday and getting filled today,  Pt verbalized understanding of medications and purpose.  RN CM placed emphasis and reminders of HF action plan and calling MD early on for change in health status.  RN CM discussed discharge from community and pt does want monthly outreach from [place] as he feels reminders, encouragement would be beneficial for him.  RN CM sent in basket to [place] and pharmacist Brad Mendoza and informed of discharge from community.  THN CM Care Plan Problem One     Most Recent Value  Care Plan Problem One  Knowledge deficit related to CHF  Role Documenting the Problem One  Care Management Coordinator  Care Plan for Problem One  Active  THN Long Term Goal   pt will verbalize better understanding of disease process CHF to facilitate better outcomes and avoid hospitalization within 90 days.  THN Long Term Goal Start Date  09/14/16  [facility] Long Term Goal Met Date  12/13/16 Brad Mendoza met]  Interventions for Problem One Long Term Goal  RN CM praised and encouraged pt for weighing daily and recording in [facility] calendar.  THN CM Short Term  Goal #1   Pt will verbalize CHF zones within 30 days.  THN CM Short Term Goal #1 Start Date  12/13/16 [goal restarted- needs ongoing reinforcement]  Interventions for Short Term Goal #1  RN CM reviewed CHF zones/ action plan and when to call MD, who to call (cardiologist), reviewed HF medications and purpose  [facility] CM Short Term Goal #2   Pt will verbalize which MD to call (cardiologist) and telephone number for exacerbation of CHF within 30 ays.  THN CM Short Term Goal #2 Start Date  09/14/16  THN CM Short Term Goal #2 Met Date  10/11/16      Plan: Transfer pt to [place] health coach  [name] [facility], BSN [place] Coordinator [phone]

## 2016-12-13 NOTE — Addendum Note (Signed)
Addended by: [name] A on: 12/13/2016 06:58 PM   Modules accepted: Orders

## 2016-12-14 ENCOUNTER — Other Ambulatory Visit: Payer: Self-pay

## 2016-12-14 NOTE — Addendum Note (Signed)
Addended by: Waldon Reining on: 12/14/2016 02:13 PM   Modules accepted: Orders

## 2016-12-14 NOTE — Patient Outreach (Signed)
[place] [facility]) Care Management  [place]  12/14/2016   Brad Mendoza [birth year] [id number]  Subjective: RN Health Coach spoke with the patient for an initial assessment. The patient lives alone and states that he is independent with his ADLS/IADLS.  He states that he is adherent with his medications.  The patient states that he checks his blood sugars daily.  His blood sugar this morning fasting was 111.  The patient has a scale and states that he weighed himself today and he weighs 213.  He said that he ate something with salt and will take his medication as prescribed to remove fluid.  The patient states that he is not symptomatic.  He feels fine. He knows that he can call and speaks with his doctors office to get guidance.  RN Health Coach dicussed low salt diabetic diet with the patient and he verbalized understanding. The patient states that he is very active  around the home and he walks around two miles a day.  The patient states that he is interesting in some information about programs for socialization.  I told the patient that I will send this information to his social worker to follow up with him. RN Health Coach let the patient I will be sending him information to help him with his CHF.  He states that he has a hard time reading but would be able to get a family member to help him review the information.  Objective:   Encounter Medications:  Outpatient Encounter Prescriptions as of 12/14/2016  Medication Sig Note  . albuterol (PROVENTIL HFA;VENTOLIN HFA) 108 (90 Base) MCG/ACT inhaler Inhale 2 puffs into the lungs every 6 (six) hours as needed for wheezing or shortness of breath. 02/29/2016: Uses PRN  . aspirin EC 81 MG tablet Take 81 mg by mouth daily.   . Capsaicin (ICY HOT ARTHRITIS THERAPY EX) Apply 1 application topically 2 (two) times daily as needed (back pain).    . carvedilol (COREG) 12.5 MG tablet TAKE ONE TABLET TWICE A DAY WITH FOOD   . fluticasone  (FLONASE) 50 MCG/ACT nasal spray USE 2 SPRAYS IN EACH NOSTRIL DAILY   . furosemide (LASIX) 20 MG tablet Take 2-3 tablets (40-60 mg total) by mouth as directed. 60 mg (3 tabs) in the AM and 40 mg (2 tabs) in the PM   . gabapentin (NEURONTIN) 300 MG capsule Take 300-600 mg by mouth at bedtime as needed.   . isosorbide mononitrate (IMDUR) 60 MG 24 hr tablet Take 1.5 tablets (90 mg total) by mouth daily.   . metFORMIN (GLUCOPHAGE-XR) 500 MG 24 hr tablet Take 1 tablet (500 mg total) by mouth daily with breakfast.   . Multiple Vitamins-Minerals (CENTRUM SILVER ADULT 50+ PO) Take 1 tablet by mouth daily.   . nitroGLYCERIN (NITROSTAT) 0.4 MG SL tablet DISSOLVE 1 TAB UNDER TOUNGE FOR CHEST PAIN. MAY REPEAT EVERY 5 MINUTES FOR 3 DOSES. IF NO RELIEF CALL 911 OR GO TO ER   . Omega-3 Fatty Acids (FISH OIL) 1000 MG CPDR Take 1 capsule by mouth daily.   . pantoprazole (PROTONIX) 40 MG tablet TAKE ONE (1) TABLET EACH DAY   . ranolazine (RANEXA) 500 MG 12 hr tablet Take 1 tablet (500 mg total) by mouth 2 (two) times daily.   . rosuvastatin (CRESTOR) 10 MG tablet TAKE ONE (1) TABLET EACH DAY   . sacubitril-valsartan (ENTRESTO) 97-103 MG Take 1 tablet by mouth 2 (two) times daily.   [name] spironolactone (ALDACTONE) 25  MG tablet Take 1 tablet (25 mg total) by mouth daily.   [name] terbinafine (LAMISIL) 1 % cream Apply 1 application topically 2 (two) times daily. Apply to both feet and between toes   . tiZANidine (ZANAFLEX) 2 MG tablet Take 1 tablet (2 mg total) by mouth 2 (two) times daily as needed for muscle spasms.   [name] 300 UNIT/ML SOPN Inject 40 units in the morning and 15 units at night   . ULTICARE MINI PEN NEEDLES 31G X 6 MM MISC 4 TIMES DAILY 12/14/2016: Patient takes 40 units  In am and 15 units pm  . linagliptin (TRADJENTA) 5 MG TABS tablet TAKE ONE (1) TABLET EACH DAY (Patient not taking: Reported on 12/14/2016) 12/14/2016: Patient states that he is not taking this he is taking toujeo    No  facility-administered encounter medications on file as of 12/14/2016.     Functional Status:  In your present state of health, do you have any difficulty performing the following activities: 12/14/2016 09/26/2016  Hearing? N N  Vision? Y N  Difficulty concentrating or making decisions? N N  Walking or climbing stairs? N N  Dressing or bathing? Y N  Doing errands, shopping? Y N  Preparing Food and eating ? N N  Using the Toilet? N N  In the past six months, have you accidently leaked urine? N N  Do you have problems with loss of bowel control? N N  Managing your Medications? N N  Comment - -  Managing your Finances? N N  Housekeeping or managing your Housekeeping? N N  Some recent data might be hidden    Fall/Depression Screening: Fall Risk  12/14/2016 12/13/2016 12/02/2016  Falls in the past year? No No No  Risk for fall due to : - Medication side effect -   PHQ 2/9 Scores 12/14/2016 12/02/2016 09/26/2016 09/14/2016 09/01/2016 08/22/2016 07/07/2016  PHQ - 2 Score 1 0 0 0 0 0 0    Assessment: Patient will  benefit from health coach outreach for disease management and support.   THN CM Care Plan Problem One     Most Recent Value  Care Plan Problem One  Knowledge deficit related to CHF  Role Documenting the Problem One  [place] for Problem One  Active  THN Long Term Goal   pt will verbalize better understanding of disease process CHF to facilitate better outcomes and avoid hospitalization within 90 days.  THN Long Term Goal Start Date  12/14/16  Interventions for Problem One Long Term Goal  RN Health will send  educational information about CHF  THN CM Short Term Goal #1   Pt will verbalize CHF red zone within 30 days.  THN CM Short Term Goal #1 Start Date  12/14/16  Interventions for Short Term Goal #1  RN Health Coach will send educational information to review  THN CM Short Term Goal #2   Pt will verbalize following low salt diet within the next 30 days  THN CM  Short Term Goal #2 Start Date  12/14/16  Interventions for Short Term Goal #2  [place]  will send information about a low salt diet.       Plan: RN Health Coach will provide ongoing education for patient on heart failure through phone calls and sending printed information to patient for further discussion.  RN Health Coach will send welcome packet with consent to patient as well as printed information on heart failure.  RN Health Coach will send initial barriers letter, assessment, and care plan to primary care physician.  [name] RN, BSN, [place] Direct Dial:  [phone] Fax: [phone]

## 2016-12-19 ENCOUNTER — Ambulatory Visit: Payer: Self-pay | Admitting: Licensed Clinical Social Worker

## 2016-12-20 ENCOUNTER — Other Ambulatory Visit (HOSPITAL_COMMUNITY): Payer: Self-pay | Admitting: Student

## 2016-12-20 ENCOUNTER — Other Ambulatory Visit: Payer: Medicare Other

## 2016-12-20 ENCOUNTER — Ambulatory Visit (INDEPENDENT_AMBULATORY_CARE_PROVIDER_SITE_OTHER): Payer: Medicare Other

## 2016-12-20 ENCOUNTER — Other Ambulatory Visit: Payer: Self-pay | Admitting: Licensed Clinical Social Worker

## 2016-12-20 DIAGNOSIS — Z23 Encounter for immunization: Secondary | ICD-10-CM | POA: Diagnosis not present

## 2016-12-20 DIAGNOSIS — I5022 Chronic systolic (congestive) heart failure: Secondary | ICD-10-CM | POA: Diagnosis not present

## 2016-12-20 NOTE — Patient Outreach (Signed)
Assessment:  CSW spoke via phone with client. CSW verified client identity. CSW and client spoke of client needs. Client sees Dr. Assunta Found as primary care doctor. Client said he had his prescribed medications and is taking medications as prescribed. Client has been receiving [facility] nursing support with RN [name]. RN [name] recently discharged client from her services and transferred client to [place] support.  Client was agreeable to receiving calls as scheduled from [facility].  Client does not have a car; thus, client relies on family and friends to transport him to and from his scheduled  medical appointments or to complete errands needed. CSW has encouraged client to utilize transport assistance services with  [facility].  CSW and client spoke of clent care plan. CSW encouraged client to communicate with CSW in next 30 days to discuss financial needs of client and about financial resources for client in the area. Client is sleeping well. He said he had an adequate food supply. CSW has encouraged client to contact financial counselor at [facility] to set up payment plan for client regarding client bill with [place] had encouraged client to contact financial counselor at [facility] to set up payment plan for client regarding client bill with [place] has provided client with name and phone number for [facility] in [place], [place] and has encouraged client to speak with Sharol Roussel at that agency about possible help with roof repairs for client.  Client has previously informed CSW that client would call Sharol Roussel at that agency to talk with her about roof repair needs of client. CSW spoke with client about [facility] and social activities available through [employer]. CSW gave client the phone number of [facility] [phone]).  Client has expressed interest in  participating in social activities in the area.  Client had a visit with Dr. Evette Doffing, client's medical provider, on 12/02/16. Client said he had talked with Sharol Roussel previously and that there was a waiting list for repairs through agency ([facility]).  CSW encouraged Jun to call Sharol Roussel again and have his name placed on waiting list for roof repair for client through that agency.  Client said he also would get family members to help him with some temporary roof repairs for client at this time. Client said he had already received one call from [facility]. CSW thanked client for phone call with CSW on 12/20/16. CSW encouraged client to call CSW at [phone] as needed to discuss social work needs of client.     Plan:  Client to communicate with CSW in next 30 days to discuss financial needs of client and to discuss financial resources for client in the area.   CSW to call client in 4 weeks to assess client needs at that time.  [name].Symiah Nowotny MSW, LCSW Licensed Clinical Social Worker [facility] Care Management [phone]

## 2016-12-23 ENCOUNTER — Telehealth (HOSPITAL_COMMUNITY): Payer: Self-pay

## 2016-12-23 DIAGNOSIS — I5042 Chronic combined systolic (congestive) and diastolic (congestive) heart failure: Secondary | ICD-10-CM

## 2016-12-23 LAB — BASIC METABOLIC PANEL
BUN / CREAT RATIO: 19 (ref 10–24)
BUN: 35 mg/dL — AB (ref 8–27)
CALCIUM: 8.5 mg/dL — AB (ref 8.6–10.2)
CHLORIDE: 104 mmol/L (ref 96–106)
CO2: 25 mmol/L (ref 20–29)
Creatinine, Ser: 1.88 mg/dL — ABNORMAL HIGH (ref 0.76–1.27)
GFR calc Af Amer: 44 mL/min/{1.73_m2} — ABNORMAL LOW (ref >59)
GFR calc non Af Amer: 38 mL/min/{1.73_m2} — ABNORMAL LOW (ref >59)
GLUCOSE: 139 mg/dL — AB (ref 65–99)
Potassium: 5.3 mmol/L — ABNORMAL HIGH (ref 3.5–5.2)
Sodium: 141 mmol/L (ref 134–144)

## 2016-12-23 MED ORDER — SPIRONOLACTONE 25 MG PO TABS: 12.5000 mg | ORAL_TABLET | Freq: Every day | ORAL | 3 refills | Status: DC

## 2016-12-23 NOTE — Telephone Encounter (Signed)
12/20/16 labs faxed from [facility] with increased serum K after recently increasing spironolactone from 12.5 mg once daily to 25 mg once daily on 12/07/16  10/17 k 4.4  10/30 k 5.3  Per Jettie Booze NP-C, advised patient to reduce Spironolactone back to 12.5 mg once daily and repeat BMET in 1 week.  Patient requested to have lab order faxed to [place]. Order faxed, patient aware and agreeable to plan as stated above.  Renee Pain, RN

## 2016-12-26 ENCOUNTER — Encounter: Payer: Self-pay | Admitting: Pharmacist

## 2016-12-26 ENCOUNTER — Other Ambulatory Visit: Payer: Self-pay | Admitting: Pharmacist

## 2016-12-26 ENCOUNTER — Telehealth (HOSPITAL_COMMUNITY): Payer: Self-pay | Admitting: Cardiology

## 2016-12-26 ENCOUNTER — Other Ambulatory Visit: Payer: Medicare Other

## 2016-12-26 ENCOUNTER — Other Ambulatory Visit (HOSPITAL_COMMUNITY): Payer: Self-pay | Admitting: *Deleted

## 2016-12-26 DIAGNOSIS — I5042 Chronic combined systolic (congestive) and diastolic (congestive) heart failure: Secondary | ICD-10-CM | POA: Diagnosis not present

## 2016-12-26 DIAGNOSIS — I509 Heart failure, unspecified: Secondary | ICD-10-CM

## 2016-12-26 NOTE — Patient Outreach (Signed)
[name] Care Management  [place]   12/26/2016  Brad Mendoza [birth year] [id number]  Subjective: Patient was called to follow up on medication adherence to diabetes medications.HIPAA identifiers were obtained. Patient is a 61 year old male with multiple medical conditions including but not limited to: CHF, hyperlipidemia, hypertension, diabetes and CAD.   Objective:  Weight-198 pounds  Blood Glucose: 7 Day Average-155 14 Day Average 153 30 Day Average 146  1 low blood sugar --34 mg/dl fasting Highest reading-200mg /dl  Encounter Medications: Outpatient Encounter Medications as of 12/26/2016  Medication Sig Note  . albuterol (PROVENTIL HFA;VENTOLIN HFA) 108 (90 Base) MCG/ACT inhaler Inhale 2 puffs into the lungs every 6 (six) hours as needed for wheezing or shortness of breath. 02/29/2016: Uses PRN  . aspirin EC 81 MG tablet Take 81 mg by mouth daily.   . Capsaicin (ICY HOT ARTHRITIS THERAPY EX) Apply 1 application topically 2 (two) times daily as needed (back pain).    . carvedilol (COREG) 12.5 MG tablet TAKE ONE TABLET TWICE A DAY WITH FOOD   . fluticasone (FLONASE) 50 MCG/ACT nasal spray USE 2 SPRAYS IN EACH NOSTRIL DAILY   . furosemide (LASIX) 20 MG tablet Take 2-3 tablets (40-60 mg total) by mouth as directed. 60 mg (3 tabs) in the AM and 40 mg (2 tabs) in the PM   . gabapentin (NEURONTIN) 300 MG capsule Take 300-600 mg by mouth at bedtime as needed.   . isosorbide mononitrate (IMDUR) 60 MG 24 hr tablet Take 1.5 tablets (90 mg total) by mouth daily.   [name] linagliptin (TRADJENTA) 5 MG TABS tablet TAKE ONE (1) TABLET EACH DAY   . metFORMIN (GLUCOPHAGE-XR) 500 MG 24 hr tablet Take 1 tablet (500 mg total) by mouth daily with breakfast.   . Multiple Vitamins-Minerals (CENTRUM SILVER ADULT 50+ PO) Take 1 tablet by mouth daily.   . nitroGLYCERIN (NITROSTAT) 0.4 MG SL tablet DISSOLVE 1 TAB UNDER TOUNGE FOR CHEST PAIN. MAY REPEAT EVERY 5 MINUTES FOR 3 DOSES.  IF NO RELIEF CALL 911 OR GO TO ER   . Omega-3 Fatty Acids (FISH OIL) 1000 MG CPDR Take 1 capsule by mouth daily.   . pantoprazole (PROTONIX) 40 MG tablet TAKE ONE (1) TABLET EACH DAY   . ranolazine (RANEXA) 500 MG 12 hr tablet Take 1 tablet (500 mg total) by mouth 2 (two) times daily.   . rosuvastatin (CRESTOR) 10 MG tablet TAKE ONE (1) TABLET EACH DAY   . sacubitril-valsartan (ENTRESTO) 97-103 MG Take 1 tablet by mouth 2 (two) times daily.   [name] spironolactone (ALDACTONE) 25 MG tablet Take 0.5 tablets (12.5 mg total) by mouth daily.   [name] terbinafine (LAMISIL) 1 % cream Apply 1 application topically 2 (two) times daily. Apply to both feet and between toes   . tiZANidine (ZANAFLEX) 2 MG tablet Take 1 tablet (2 mg total) by mouth 2 (two) times daily as needed for muscle spasms.   [name] 300 UNIT/ML SOPN Inject 40 units in the morning and 15 units at night 12/14/2016: The patient states that he takes 40 units in the am and 15 units in the pm  . ULTICARE MINI PEN NEEDLES 31G X 6 MM MISC 4 TIMES DAILY    No facility-administered encounter medications on file as of 12/26/2016.     Functional Status: In your present state of health, do you have any difficulty performing the following activities: 12/14/2016 09/26/2016  Hearing? N N  Vision? Aggie Moats  Difficulty concentrating or making decisions? N N  Walking or climbing stairs? N N  Dressing or bathing? Y N  Doing errands, shopping? Y N  Preparing Food and eating ? N N  Using the Toilet? N N  In the past six months, have you accidently leaked urine? N N  Do you have problems with loss of bowel control? N N  Managing your Medications? N N  Comment - -  Managing your Finances? N N  Housekeeping or managing your Housekeeping? N N  Some recent data might be hidden    Fall/Depression Screening: Fall Risk  12/14/2016 12/13/2016 12/02/2016  Falls in the past year? No No No  Risk for fall due to : - Medication side effect -   PHQ 2/9 Scores  12/14/2016 12/02/2016 09/26/2016 09/14/2016 09/01/2016 08/22/2016 07/07/2016  PHQ - 2 Score 1 0 0 0 0 0 0      Assessment:  Neurologic/Psychologic:  Cardiovascular: Amlodipine Aspirin 81mg  Carvedilol  Furosemide Isosorbide Ranexa Rosuvastatin Entresto Spironolactone Nitroglycerin Omega-3 Fatty Acids  Pulmonary/Allergy: Albuterol HFA Fluticasone   Gastrointestinal: Pantoprazole  Endocrine: Metformin Tradjenta Toujeo  Topical: Capsaicin terbinafine   Vitamins/Minerals: Centrum Sliver    Plan: Hypoglycemia management discussed with the patient. Blood glucose values were reviewed Patient's case will be closed and he will continue to be followed by [facility], [name]

## 2016-12-26 NOTE — Telephone Encounter (Signed)
Patient aware. Patient voiced understanding. Patient is unable to return to CHF clinic for repeat labs order placed and faxed to PCP office for repeat BMET. Patient has limited transportation to [facility], advised if patient does need Veltessa we will have to mail application to proceed with med start.

## 2016-12-26 NOTE — Telephone Encounter (Signed)
-----   Message from Shirley Friar, PA-C sent at 12/26/2016  7:42 AM EST ----- Needs recheck in today vs tomorrow.  Please have sign Veltessa form when he comes in.  Please ask him to avoid high potassium foods.     Legrand Como [address]" [place], PA-C 12/26/2016 7:42 AM

## 2016-12-27 ENCOUNTER — Other Ambulatory Visit: Payer: Self-pay | Admitting: Internal Medicine

## 2016-12-27 LAB — BASIC METABOLIC PANEL
BUN/Creatinine Ratio: 25 — ABNORMAL HIGH (ref 10–24)
BUN: 42 mg/dL — AB (ref 8–27)
CALCIUM: 8.4 mg/dL — AB (ref 8.6–10.2)
CHLORIDE: 109 mmol/L — AB (ref 96–106)
CO2: 21 mmol/L (ref 20–29)
CREATININE: 1.69 mg/dL — AB (ref 0.76–1.27)
GFR calc Af Amer: 50 mL/min/{1.73_m2} — ABNORMAL LOW (ref >59)
GFR calc non Af Amer: 43 mL/min/{1.73_m2} — ABNORMAL LOW (ref >59)
GLUCOSE: 105 mg/dL — AB (ref 65–99)
Potassium: 4.6 mmol/L (ref 3.5–5.2)
Sodium: 144 mmol/L (ref 134–144)

## 2016-12-29 ENCOUNTER — Other Ambulatory Visit (HOSPITAL_COMMUNITY): Payer: Self-pay | Admitting: Internal Medicine

## 2017-01-16 ENCOUNTER — Other Ambulatory Visit: Payer: Self-pay

## 2017-01-16 NOTE — Patient Outreach (Signed)
[facility]) Care Management  [place]  01/16/2017   Brad Mendoza June 14, 1955 [id number]  Subjective:  Telephone call to patient for monthly outreach. HIPAA verified. The patient states that he is doing well. The patient stated that his blood sugar this morning was 146 fasting. Today his weight is 213. The patient denies any pain or swelling. No problems with his breathing. We discussed his diet and talked about some of the salt and sugar in his foods over Thanksgiving.  The patient verbalized understanding.  The patient states that he is still active walking when the weather allows and working in his yard. We talked about the COPD action plan and 24 hour nurse line. The patient verbalized understanding  and he stated he did not receive the information I sent him on 12/14/16.  I told him that I will re mail the information.  Objective:   Encounter Medications:  Outpatient Encounter Medications as of 01/16/2017  Medication Sig Note  . albuterol (PROVENTIL HFA;VENTOLIN HFA) 108 (90 Base) MCG/ACT inhaler Inhale 2 puffs into the lungs every 6 (six) hours as needed for wheezing or shortness of breath. 02/29/2016: Uses PRN  . aspirin EC 81 MG tablet Take 81 mg by mouth daily.   . Capsaicin (ICY HOT ARTHRITIS THERAPY EX) Apply 1 application topically 2 (two) times daily as needed (back pain).    . carvedilol (COREG) 12.5 MG tablet TAKE ONE TABLET TWICE A DAY WITH FOOD   . fluticasone (FLONASE) 50 MCG/ACT nasal spray USE 2 SPRAYS IN EACH NOSTRIL DAILY   . furosemide (LASIX) 20 MG tablet TAKE 3 TABLETS IN THE MORNING AND TAKE 2 TABLETS IN THE EVENING AS DIRECTED   . gabapentin (NEURONTIN) 300 MG capsule Take 300-600 mg by mouth at bedtime as needed.   . isosorbide mononitrate (IMDUR) 60 MG 24 hr tablet Take 1.5 tablets (90 mg total) by mouth daily.   [name] linagliptin (TRADJENTA) 5 MG TABS tablet TAKE ONE (1) TABLET EACH DAY   . metFORMIN (GLUCOPHAGE-XR) 500 MG 24 hr tablet  Take 1 tablet (500 mg total) by mouth daily with breakfast.   . Multiple Vitamins-Minerals (CENTRUM SILVER ADULT 50+ PO) Take 1 tablet by mouth daily.   . nitroGLYCERIN (NITROSTAT) 0.4 MG SL tablet DISSOLVE 1 TAB UNDER TOUNGE FOR CHEST PAIN. MAY REPEAT EVERY 5 MINUTES FOR 3 DOSES. IF NO RELIEF CALL 911 OR GO TO ER   . Omega-3 Fatty Acids (FISH OIL) 1000 MG CPDR Take 1 capsule by mouth daily.   . pantoprazole (PROTONIX) 40 MG tablet TAKE ONE (1) TABLET EACH DAY   . ranolazine (RANEXA) 500 MG 12 hr tablet Take 1 tablet (500 mg total) by mouth 2 (two) times daily.   . rosuvastatin (CRESTOR) 10 MG tablet TAKE ONE (1) TABLET EACH DAY   . sacubitril-valsartan (ENTRESTO) 97-103 MG Take 1 tablet by mouth 2 (two) times daily.   [name] spironolactone (ALDACTONE) 25 MG tablet Take 0.5 tablets (12.5 mg total) by mouth daily.   [name] terbinafine (LAMISIL) 1 % cream Apply 1 application topically 2 (two) times daily. Apply to both feet and between toes   . tiZANidine (ZANAFLEX) 2 MG tablet Take 1 tablet (2 mg total) by mouth 2 (two) times daily as needed for muscle spasms.   [name] 300 UNIT/ML SOPN Inject 40 units in the morning and 15 units at night 12/14/2016: The patient states that he takes 40 units in the am and 15 units in the pm  .  ULTICARE MINI PEN NEEDLES 31G X 6 MM MISC 4 TIMES DAILY    No facility-administered encounter medications on file as of 01/16/2017.     Functional Status:  In your present state of health, do you have any difficulty performing the following activities: 12/14/2016 09/26/2016  Hearing? N N  Vision? Y N  Difficulty concentrating or making decisions? N N  Walking or climbing stairs? N N  Dressing or bathing? Y N  Doing errands, shopping? Y N  Preparing Food and eating ? N N  Using the Toilet? N N  In the past six months, have you accidently leaked urine? N N  Do you have problems with loss of bowel control? N N  Managing your Medications? N N  Comment - -  Managing your  Finances? N N  Housekeeping or managing your Housekeeping? N N  Some recent data might be hidden    Fall/Depression Screening: Fall Risk  01/16/2017 12/14/2016 12/13/2016  Falls in the past year? No No No  Risk for fall due to : - - Medication side effect   PHQ 2/9 Scores 12/14/2016 12/02/2016 09/26/2016 09/14/2016 09/01/2016 08/22/2016 07/07/2016  PHQ - 2 Score 1 0 0 0 0 0 0    Assessment: Patient continues to benefit from health coach outreach for disease management and support.   THN CM Care Plan Problem One     Most Recent Value  Care Plan Problem One  Knowledge deficit related to CHF  Role Documenting the Problem One  [place] for Problem One  Active  THN Long Term Goal   pt will verbalize better understanding of disease process CHF to facilitate better outcomes and avoid hospitalization within 90 days.  THN Long Term Goal Start Date  12/14/16  Interventions for Problem One Long Term Goal  RN Health discused diease process with the patient and re mail information on CHF the patient stated that he did not recieve.  THN CM Short Term Goal #1   Pt will verbalize CHF red zone within 30 days.  THN CM Short Term Goal #1 Start Date  12/14/16  Interventions for Short Term Goal #1  RN Health Coach discussed copd action plan.   THN CM Short Term Goal #2   Pt will verbalize following low salt diet within the next 30 days  THN CM Short Term Goal #2 Start Date  12/14/16  Interventions for Short Term Goal #2  [place] discussed diet over the holidays. Talked with the patient about watching his salt intake and some of the foods he ate.      Plan: RN Health Coach will contact patient in the month of December and patient agrees to next outreach.  [name] RN, BSN, [place] Direct Dial:  [phone] Fax: [phone]

## 2017-01-19 ENCOUNTER — Other Ambulatory Visit: Payer: Self-pay | Admitting: Licensed Clinical Social Worker

## 2017-01-19 NOTE — Patient Outreach (Signed)
Assessment:  CSW spoke via phone with client. CSW verified client identity. CSW received verbal permission from client on 01/19/17 for CSW to speak with client about current client  needs and status.  Client sees Dr. Assunta Found as primary care doctor. Client said he had his prescribed medications and is taking medications as prescribed.  Client is receiving telephonic support with [place], [name]. Client does not have a car; thus, he relies on family and friends to transport him to and from his scheduled medical appointments or to completed errands.  CSW have talked with client about transport assistance services with [place].  CSW and client spoke of client care plan. CSW encouraged Draedyn to communicate with CSW in next 30 days to discuss financial needs of client and to discuss financial resources for client in the area. CSW spoke with client previously about [facility]. CSW had given client phone number of [facility] [phone]).   Client said he is eating adequately. He said he is sleeping well.  Client said he has been able to afford his prescribed medications. Client said he talked with financial counselor at [facility] recently and is setting up monthly payment schedule with [facility]  Client said he is making monthly payments also to his primary care doctor to pay on bill client owes at at primary doctor's office.  Client had appointment with Dr. Evette Doffing in October of 2018.  Client said he has appointment with kidney specialist in January of 2019.  CSW spoke with client about [facility] program support in nursing, pharmacy and social work.  CSW talked with client about roof repairs. He said he has tarp on roof to protect home. He said he will save money and try to purchase wood and shingles to get his roof repaired. CSW encouraged client to call CSW at [phone] as needed to discuss social work needs of client. CSW thanked Sammie for  phone call with CSW on 01/19/17.  Plan:  Client to communicate with CSW in next 30 days to discuss financial needs of client  and to discuss financial resources for client in the area.  CSW to call client in 4 weeks to assess client needs at that time.  [name].Khristina Janota MSW, LCSW Licensed Clinical Social Worker [name] Care Management [phone]

## 2017-01-27 ENCOUNTER — Telehealth: Payer: Self-pay | Admitting: Physician Assistant

## 2017-01-27 NOTE — Telephone Encounter (Signed)
Patient who is being followed by heart failure clinic paged after hours answering service complaining of feet swelling.  He also mentions he has gained 5 pounds.  He will increase his Lasix to 60 mg twice daily for the next 2 days before going back to the previous dose.  He will contact us if his weight does not decrease back down to his baseline early next week.  Hilbert Corrigan PA Pager: [phone]

## 2017-02-15 ENCOUNTER — Other Ambulatory Visit: Payer: Self-pay

## 2017-02-15 NOTE — Patient Outreach (Signed)
[facility]) Care Management  02/15/2017  Brad Mendoza 19-Oct-1955 [id number]   Telephone call placed to the patient for monthly assessment. No answer. Unable to leave a message.   Plan: RN [facility] will make an outreach attempt to the patient in the month of January.  Brad Arms RN, BSN, [place] Direct Dial:  [phone] Fax: [phone]

## 2017-02-16 ENCOUNTER — Other Ambulatory Visit: Payer: Self-pay | Admitting: Licensed Clinical Social Worker

## 2017-02-16 NOTE — Patient Outreach (Signed)
Assessment:  CSW spoke via phone with client. CSW verified client identity. CSW received verbal permission from client on 02/16/17 for CSW to speak with client about current client needs and status.  Client sees Dr. Evette Doffing as primary care doctor. Client is receiving telephonic support with [place], [name].  Client said he had his prescribed medications and is taking medications as prescribed. Client relies on family and friends to transport client to and from client's scheduled medical appointments . CSW has talked with client about transport assistance services with [place]. Client has financial challenges. CSW and client spoke of client care plan. CSW encouraged Kaidan to communicate with CSW in next 30 days to discuss financial needs of client and to discuss financial resources for client in the area. Client is attending scheduled client medical appointments. Client has talked with financial counselor at [facility] and has set up monthly payment plan for client with [facility]. Client said he also is making monthly payments to primary care doctor to pay on his current bill owed to primary care doctor.  Client said he has appointment scheduled with kidney specialist in January of 2019 in [place], [place].  Client said he is trying to save money to buy supplies to repair roof at his home.  He said he will try to have repair done to his roof in Spring of 2019 when weather is warmer. He is not having any current leaks in his roof. So he plans to save funds for roof repair and seek friends to help him repair his roof in Spring of 2019.  CSW spoke with client about support for client with [place], [name]. Con said he had talked with Traci about diet for client, low salt intake for client and use of fruit and vegetables for client.  Jamarquis said he is appreciative of [facility] support with [place], RN.  CSW thanked Delmo for  phone call with CSW on 02/16/17. CSW encouraged Jaxsyn to call CSW at [phone] as needed to discuss social work needs of client.   Plan:  Client to communicate with CSW in next 30 days to discuss financial needs of client and to discuss financial resources for client in the area.   CSW to call client in 4 weeks to assess client needs at that time.  CSW to collaborate with [place], [name] RN, in monitoring needs of client.  [name].Jhoanna Heyde MSW, LCSW Licensed Clinical Social Worker [facility] Care Management [phone]

## 2017-02-22 ENCOUNTER — Other Ambulatory Visit (HOSPITAL_COMMUNITY): Payer: Self-pay | Admitting: Cardiology

## 2017-02-24 DIAGNOSIS — I10 Essential (primary) hypertension: Secondary | ICD-10-CM | POA: Diagnosis not present

## 2017-02-24 DIAGNOSIS — R809 Proteinuria, unspecified: Secondary | ICD-10-CM | POA: Diagnosis not present

## 2017-02-24 DIAGNOSIS — Z1159 Encounter for screening for other viral diseases: Secondary | ICD-10-CM | POA: Diagnosis not present

## 2017-02-24 DIAGNOSIS — Z79899 Other long term (current) drug therapy: Secondary | ICD-10-CM | POA: Diagnosis not present

## 2017-02-24 DIAGNOSIS — E559 Vitamin D deficiency, unspecified: Secondary | ICD-10-CM | POA: Diagnosis not present

## 2017-02-24 DIAGNOSIS — D509 Iron deficiency anemia, unspecified: Secondary | ICD-10-CM | POA: Diagnosis not present

## 2017-02-24 DIAGNOSIS — N183 Chronic kidney disease, stage 3 (moderate): Secondary | ICD-10-CM | POA: Diagnosis not present

## 2017-03-03 ENCOUNTER — Other Ambulatory Visit: Payer: Self-pay

## 2017-03-03 NOTE — Patient Outreach (Signed)
[facility]) Care Management  [place]  03/03/2017   Brad Mendoza 1955-03-29 [id number]  Subjective: Telephone call placed to th patient for monthly assessment. HIPAA verified.  The patient states that he is doing well.  He denies any pain or falls since we last spoke.  The patient states that he is following his diabetic and low sodium diet.  The patient two weeks ago had purchased some bologna and started to swell.  He did call his doctor's office and was given instructions on how to take his fluids pills.  The patient states that the swelling did resolve and he is more aware of the foods that he eats.  He states he is exercising by walking when the weather allows. The patient has an appointment with his primary care at the end of February.  Objective:   Encounter Medications:  Outpatient Encounter Medications as of 03/03/2017  Medication Sig Note  . albuterol (PROVENTIL HFA;VENTOLIN HFA) 108 (90 Base) MCG/ACT inhaler Inhale 2 puffs into the lungs every 6 (six) hours as needed for wheezing or shortness of breath. 02/29/2016: Uses PRN  . aspirin EC 81 MG tablet Take 81 mg by mouth daily.   . Capsaicin (ICY HOT ARTHRITIS THERAPY EX) Apply 1 application topically 2 (two) times daily as needed (back pain).    . carvedilol (COREG) 12.5 MG tablet TAKE ONE TABLET TWICE A DAY WITH FOOD   . fluticasone (FLONASE) 50 MCG/ACT nasal spray USE 2 SPRAYS IN EACH NOSTRIL DAILY   . furosemide (LASIX) 20 MG tablet TAKE 3 TABLETS IN THE MORNING AND TAKE 2 TABLETS IN THE EVENING AS DIRECTED   . gabapentin (NEURONTIN) 300 MG capsule Take 300-600 mg by mouth at bedtime as needed.   . isosorbide mononitrate (IMDUR) 60 MG 24 hr tablet Take 1.5 tablets (90 mg total) by mouth daily.   [name] linagliptin (TRADJENTA) 5 MG TABS tablet TAKE ONE (1) TABLET EACH DAY   . metFORMIN (GLUCOPHAGE-XR) 500 MG 24 hr tablet Take 1 tablet (500 mg total) by mouth daily with breakfast.   . Multiple  Vitamins-Minerals (CENTRUM SILVER ADULT 50+ PO) Take 1 tablet by mouth daily.   . nitroGLYCERIN (NITROSTAT) 0.4 MG SL tablet DISSOLVE 1 TAB UNDER TOUNGE FOR CHEST PAIN. MAY REPEAT EVERY 5 MINUTES FOR 3 DOSES. IF NO RELIEF CALL 911 OR GO TO ER   . Omega-3 Fatty Acids (FISH OIL) 1000 MG CPDR Take 1 capsule by mouth daily.   . pantoprazole (PROTONIX) 40 MG tablet TAKE ONE (1) TABLET EACH DAY   . RANEXA 500 MG 12 hr tablet TAKE ONE TABLET BY MOUTH TWICE DAILY   . rosuvastatin (CRESTOR) 10 MG tablet TAKE ONE (1) TABLET EACH DAY   . sacubitril-valsartan (ENTRESTO) 97-103 MG Take 1 tablet by mouth 2 (two) times daily.   [name] spironolactone (ALDACTONE) 25 MG tablet Take 0.5 tablets (12.5 mg total) by mouth daily.   [name] terbinafine (LAMISIL) 1 % cream Apply 1 application topically 2 (two) times daily. Apply to both feet and between toes   . TOUJEO SOLOSTAR 300 UNIT/ML SOPN Inject 40 units in the morning and 15 units at night 12/14/2016: The patient states that he takes 40 units in the am and 15 units in the pm  . ULTICARE MINI PEN NEEDLES 31G X 6 MM MISC 4 TIMES DAILY   . tiZANidine (ZANAFLEX) 2 MG tablet Take 1 tablet (2 mg total) by mouth 2 (two) times daily as needed for muscle spasms. (  Patient not taking: Reported on 03/03/2017)    No facility-administered encounter medications on file as of 03/03/2017.     Functional Status:  In your present state of health, do you have any difficulty performing the following activities: 12/14/2016 09/26/2016  Hearing? N N  Vision? Y N  Difficulty concentrating or making decisions? N N  Walking or climbing stairs? N N  Dressing or bathing? Y N  Doing errands, shopping? Y N  Preparing Food and eating ? N N  Using the Toilet? N N  In the past six months, have you accidently leaked urine? N N  Do you have problems with loss of bowel control? N N  Managing your Medications? N N  Comment - -  Managing your Finances? N N  Housekeeping or managing your Housekeeping? N N   Some recent data might be hidden    Fall/Depression Screening: Fall Risk  03/03/2017 01/16/2017 12/14/2016  Falls in the past year? No No No  Risk for fall due to : - - -   PHQ 2/9 Scores 12/14/2016 12/02/2016 09/26/2016 09/14/2016 09/01/2016 08/22/2016 07/07/2016  PHQ - 2 Score 1 0 0 0 0 0 0    Assessment: Patient continues to benefit from health coach outreach for disease management and support.  THN CM Care Plan Problem One     Most Recent Value  Care Plan Problem One  Knowledge deficit related to CHF  Role Documenting the Problem One  [place] for Problem One  Active  THN Long Term Goal   pt will verbalize better understanding of disease process CHF to facilitate better outcomes and avoid hospitalization within 90 days.  THN Long Term Goal Start Date  12/14/16  Interventions for Problem One Long Term Goal  RN Health reviewed diease process with the patient and how diet can affect his health  THN CM Short Term Goal #1   Pt will verbalize CHF red zone within 30 days.  THN CM Short Term Goal #1 Start Date  12/14/16  [facility] CM Short Term Goal #1 Met Date  03/03/17  Interventions for Short Term Goal #1  RN Health Coach discussed copd action plan.   THN CM Short Term Goal #2   Pt will verbalize following low salt diet within the next 30 days  THN CM Short Term Goal #2 Start Date  12/14/16  [facility] CM Short Term Goal #2 Met Date  03/03/17  Interventions for Short Term Goal #2  [place] discussed diet over the holidays. Talked with the patient about watching his salt intake and some of the foods he ate.     Plan: RN Health Coach will contact patient in the month of February and patient agrees to next outreach.  [name] RN, BSN, [place] Direct Dial:  [phone] Fax: [phone]

## 2017-03-03 NOTE — Patient Outreach (Signed)
[facility] [facility]) Care Management  03/03/2017  Brad Mendoza 1955/12/05 [id number]   Telephone assessment placed to the patient for monthly assessment. No answer.  Unable to leave a message du to the mailbox has not been set up.  Plan:  RN Health Coach will make an outreach attempt to the patient in the month of January.  [name] RN, BSN, [place] Direct Dial:  [phone] Fax: [phone]

## 2017-03-07 ENCOUNTER — Ambulatory Visit: Payer: Self-pay

## 2017-03-08 ENCOUNTER — Encounter (HOSPITAL_COMMUNITY): Payer: Self-pay | Admitting: Emergency Medicine

## 2017-03-08 ENCOUNTER — Other Ambulatory Visit: Payer: Self-pay

## 2017-03-08 ENCOUNTER — Telehealth: Payer: Self-pay | Admitting: Physician Assistant

## 2017-03-08 ENCOUNTER — Emergency Department (HOSPITAL_COMMUNITY)
Admission: EM | Admit: 2017-03-08 | Discharge: 2017-03-09 | Disposition: A | Discharge status: Home / Self Care | Payer: Medicare Other | Attending: Emergency Medicine | Admitting: Emergency Medicine

## 2017-03-08 ENCOUNTER — Emergency Department (HOSPITAL_COMMUNITY): Payer: Medicare Other

## 2017-03-08 DIAGNOSIS — Z7982 Long term (current) use of aspirin: Secondary | ICD-10-CM | POA: Diagnosis not present

## 2017-03-08 DIAGNOSIS — D649 Anemia, unspecified: Secondary | ICD-10-CM | POA: Diagnosis not present

## 2017-03-08 DIAGNOSIS — Z79899 Other long term (current) drug therapy: Secondary | ICD-10-CM | POA: Insufficient documentation

## 2017-03-08 DIAGNOSIS — I110 Hypertensive heart disease with heart failure: Secondary | ICD-10-CM | POA: Diagnosis not present

## 2017-03-08 DIAGNOSIS — R0789 Other chest pain: Secondary | ICD-10-CM | POA: Diagnosis not present

## 2017-03-08 DIAGNOSIS — N189 Chronic kidney disease, unspecified: Secondary | ICD-10-CM | POA: Insufficient documentation

## 2017-03-08 DIAGNOSIS — R05 Cough: Secondary | ICD-10-CM | POA: Diagnosis not present

## 2017-03-08 DIAGNOSIS — Z7984 Long term (current) use of oral hypoglycemic drugs: Secondary | ICD-10-CM | POA: Insufficient documentation

## 2017-03-08 DIAGNOSIS — I5043 Acute on chronic combined systolic (congestive) and diastolic (congestive) heart failure: Secondary | ICD-10-CM | POA: Insufficient documentation

## 2017-03-08 DIAGNOSIS — N289 Disorder of kidney and ureter, unspecified: Secondary | ICD-10-CM

## 2017-03-08 DIAGNOSIS — R0602 Shortness of breath: Secondary | ICD-10-CM | POA: Diagnosis present

## 2017-03-08 LAB — BASIC METABOLIC PANEL
Anion gap: 8 (ref 5–15)
BUN: 32 mg/dL — ABNORMAL HIGH (ref 6–20)
CALCIUM: 8.1 mg/dL — AB (ref 8.9–10.3)
CHLORIDE: 105 mmol/L (ref 101–111)
CO2: 25 mmol/L (ref 22–32)
CREATININE: 1.76 mg/dL — AB (ref 0.61–1.24)
GFR calc non Af Amer: 40 mL/min — ABNORMAL LOW (ref >60)
GFR, EST AFRICAN AMERICAN: 46 mL/min — AB (ref >60)
Glucose, Bld: 146 mg/dL — ABNORMAL HIGH (ref 65–99)
Potassium: 4.2 mmol/L (ref 3.5–5.1)
SODIUM: 138 mmol/L (ref 135–145)

## 2017-03-08 LAB — CBC WITH DIFFERENTIAL/PLATELET
BASOS ABS: 0 10*3/uL (ref 0.0–0.1)
BASOS PCT: 1 %
Eosinophils Absolute: 0.3 10*3/uL (ref 0.0–0.7)
Eosinophils Relative: 3 %
HCT: 33 % — ABNORMAL LOW (ref 39.0–52.0)
Hemoglobin: 10.6 g/dL — ABNORMAL LOW (ref 13.0–17.0)
Lymphocytes Relative: 14 %
Lymphs Abs: 1.3 10*3/uL (ref 0.7–4.0)
MCH: 27.3 pg (ref 26.0–34.0)
MCHC: 32.1 g/dL (ref 30.0–36.0)
MCV: 85.1 fL (ref 78.0–100.0)
MONO ABS: 0.9 10*3/uL (ref 0.1–1.0)
Monocytes Relative: 11 %
NEUTROS ABS: 6.4 10*3/uL (ref 1.7–7.7)
Neutrophils Relative %: 71 %
PLATELETS: 223 10*3/uL (ref 150–400)
RBC: 3.88 MIL/uL — ABNORMAL LOW (ref 4.22–5.81)
RDW: 14 % (ref 11.5–15.5)
WBC: 8.9 10*3/uL (ref 4.0–10.5)

## 2017-03-08 LAB — TROPONIN I: TROPONIN I: 0.03 ng/mL — AB (ref <0.03)

## 2017-03-08 LAB — BRAIN NATRIURETIC PEPTIDE: B NATRIURETIC PEPTIDE 5: 570 pg/mL — AB (ref 0.0–100.0)

## 2017-03-08 MED ORDER — FUROSEMIDE 10 MG/ML IJ SOLN
80.0000 mg | Freq: Once | INTRAMUSCULAR | Status: AC
  Administered 2017-03-08: 80 mg via INTRAVENOUS
  Filled 2017-03-08: qty 8

## 2017-03-08 MED ORDER — ALBUTEROL SULFATE (2.5 MG/3ML) 0.083% IN NEBU
5.0000 mg | INHALATION_SOLUTION | Freq: Once | RESPIRATORY_TRACT | Status: AC
  Administered 2017-03-08: 5 mg via RESPIRATORY_TRACT
  Filled 2017-03-08: qty 6

## 2017-03-08 MED ORDER — ASPIRIN 81 MG PO CHEW
324.0000 mg | CHEWABLE_TABLET | Freq: Once | ORAL | Status: AC
  Administered 2017-03-08: 324 mg via ORAL
  Filled 2017-03-08: qty 4

## 2017-03-08 NOTE — ED Notes (Signed)
Placed pt on monitor and placed IV

## 2017-03-08 NOTE — ED Triage Notes (Signed)
Pt c/o sob, cough and chest tightness that started yesterday.

## 2017-03-08 NOTE — ED Provider Notes (Signed)
[facility] EMERGENCY DEPARTMENT Provider Note   CSN: [id number] Arrival date & time: 03/08/17  1946     History   Chief Complaint Chief Complaint  Patient presents with  . Shortness of Breath    HPI Brad Mendoza is a 62 y.o. male.  The history is provided by the patient.  He has history of coronary artery disease, congestive heart failure, hypertension, diabetes, hyperlipidemia and comes in with cough and dyspnea which started yesterday.  Cough is nonproductive.  Dyspnea is worse with exertion, but not with lying flat.  He did notice some wheezing.  There is also an associated tight feeling and pressure feeling in his chest which he rates at 8/10.  Heaviness and tightness is not present currently.  He is feeling considerably better following a nebulizer treatment in the ED prior to my seeing him.  Past Medical History:  Diagnosis Date  . Acid reflux   . Back pain    f4 and f5  . CAD (coronary artery disease)   . CHF (congestive heart failure) ([place])    last echo was in March 2016  . Diabetes mellitus 2005  . Diverticulosis   . H/O chest pain 2005   ECHO  . HTN (hypertension)   . Hyperlipidemia   . Nonischemic cardiomyopathy ([place])   . Onychomycosis   . Polysubstance abuse [facility])     Patient Active Problem List   Diagnosis Date Noted  . Anginal pain ([place]) 06/01/2016  . CHF exacerbation ([place]) 11/17/2015  . Chronic combined systolic and diastolic heart failure ([place]) 11/16/2015  . Type 2 diabetes mellitus with hyperosmolar nonketotic hyperglycemia ([place]) 05/08/2015  . Hyponatremia 05/08/2015  . Hypochloremia 05/08/2015  . Type 2 diabetes mellitus with diabetic polyneuropathy ([place]) 12/16/2013  . CAD (coronary artery disease) 09/18/2013  . Heme positive stool 06/12/2013  . Seasonal allergic rhinitis 07/11/2012  . Polysubstance abuse ([place]) 12/24/2010  . Onychomycosis   . Acid reflux   . Nonischemic cardiomyopathy ([place])   . Type 2 diabetes mellitus with complications  ([place])   . Hyperlipemia 11/16/2009  . Obesity 11/16/2009  . Tobacco dependence 11/16/2009  . Essential hypertension 11/16/2009    Past Surgical History:  Procedure Laterality Date  . APPENDECTOMY    . CARDIAC CATHETERIZATION  2005   4 frech cath  . COLONOSCOPY N/A 08/01/2013   Procedure: COLONOSCOPY;  Surgeon: Rogene Houston, MD;  Location: [facility] ENDO SUITE;  Service: Endoscopy;  Laterality: N/A;  rescheduled to [place] notified pt  . ESOPHAGOGASTRODUODENOSCOPY N/A 04/23/2015   Procedure: ESOPHAGOGASTRODUODENOSCOPY (EGD);  Surgeon: Rogene Houston, MD;  Location: [facility] ENDO SUITE;  Service: Endoscopy;  Laterality: N/A;  1:25  . RIGHT/LEFT HEART CATH AND CORONARY ANGIOGRAPHY N/A 06/03/2016   Procedure: Right/Left Heart Cath and Coronary Angiography;  Surgeon: Belva Crome, MD;  Location: [place] CV LAB;  Service: Cardiovascular;  Laterality: N/A;       Home Medications    Prior to Admission medications   Medication Sig Start Date End Date Taking? Authorizing Provider  albuterol (PROVENTIL HFA;VENTOLIN HFA) 108 (90 Base) MCG/ACT inhaler Inhale 2 puffs into the lungs every 6 (six) hours as needed for wheezing or shortness of breath. 05/21/15  Yes [name], MD  aspirin EC 81 MG tablet Take 81 mg by mouth daily.   Yes [provider]  Capsaicin (ICY HOT ARTHRITIS THERAPY EX) Apply 1 application topically 2 (two) times daily as needed (back pain).    Yes [provider]  carvedilol (COREG) 12.5 MG tablet TAKE ONE TABLET TWICE A DAY WITH FOOD 12/02/16  Yes [name], MD  fluticasone [facility]) 50 MCG/ACT nasal spray USE 2 SPRAYS IN EACH NOSTRIL DAILY 09/07/16  Yes [name], MD  furosemide (LASIX) 20 MG tablet TAKE 3 TABLETS IN THE MORNING AND TAKE 2 TABLETS IN THE EVENING AS DIRECTED 12/29/16  Yes Bensimhon, Shaune Pascal, MD  isosorbide mononitrate (IMDUR) 60 MG 24 hr tablet Take 1.5 tablets (90 mg total) by mouth daily. 12/02/16  Yes [name], MD    metFORMIN (GLUCOPHAGE-XR) 500 MG 24 hr tablet Take 1 tablet (500 mg total) by mouth daily with breakfast. Patient taking differently: Take 500 mg by mouth daily as needed (for blood sugars).  12/02/16  Yes [name], MD  Multiple Vitamins-Minerals (CENTRUM SILVER ADULT 50+ PO) Take 1 tablet by mouth daily.   Yes [provider]  nitroGLYCERIN (NITROSTAT) 0.4 MG SL tablet DISSOLVE 1 TAB UNDER TOUNGE FOR CHEST PAIN. MAY REPEAT EVERY 5 MINUTES FOR 3 DOSES. IF NO RELIEF CALL 911 OR GO TO ER 12/10/15  Yes [name], MD  Omega-3 Fatty Acids (FISH OIL) 1000 MG CPDR Take 1 capsule by mouth daily.   Yes [provider]  pantoprazole (PROTONIX) 40 MG tablet TAKE ONE (1) TABLET EACH DAY 12/02/16  Yes [name], MD  RANEXA 500 MG 12 hr tablet TAKE ONE TABLET BY MOUTH TWICE DAILY 02/22/17  Yes Bensimhon, Shaune Pascal, MD  rosuvastatin (CRESTOR) 10 MG tablet TAKE ONE (1) TABLET EACH DAY 12/02/16  Yes [name], MD  sacubitril-valsartan (ENTRESTO) 97-103 MG Take 1 tablet by mouth 2 (two) times daily. 08/26/16  Yes Arbutus Leas, NP  spironolactone (ALDACTONE) 25 MG tablet Take 0.5 tablets (12.5 mg total) by mouth daily. 12/23/16  Yes Shirley Friar, PA-C  terbinafine (LAMISIL) 1 % cream Apply 1 application topically 2 (two) times daily. Apply to both feet and between toes 03/28/16  Yes Eckard, Tammy, PharmD  TOUJEO SOLOSTAR 300 UNIT/ML SOPN Inject 15-40 Units into the skin See admin instructions. Inject 40 units in the morning and 15 units at night 09/07/16  Yes [provider]  ULTICARE MINI PEN NEEDLES 31G X 6 MM MISC 4 TIMES DAILY 11/28/16   [name], MD    Family History Family History  Problem Relation Age of Onset  . Hypertension Father   . Diabetes Father   . Cancer Mother   . Alcohol abuse Brother   . Colon cancer Neg Hx     Social History Social History   Tobacco Use  . Smoking status: Never Smoker  . Smokeless tobacco: Current  User    Types: Snuff  . Tobacco comment: dips snuff about 6 cans/week for 3 years  Substance Use Topics  . Alcohol use: Yes    Alcohol/week: 0.6 oz    Types: 1 Cans of beer per week    Comment: occ - has cut down, now once every 2-3 months  . Drug use: Yes    Types: Marijuana    Comment: occasional use - smoking less     Allergies   Sulfa antibiotics and Penicillins   Review of Systems Review of Systems  All other systems reviewed and are negative.    Physical Exam Updated Vital Signs BP (!) 143/96 (BP Location: Right Arm)   Pulse 92   Temp 98.7 F (37.1 C) (Oral)   Resp 16   Ht 5'  9" (1.753 m)   Wt 100.2 kg (221 lb)   SpO2 96%   BMI 32.64 kg/m   Physical Exam  Nursing note and vitals reviewed.  62 year old male, resting comfortably and in no acute distress. Vital signs are significant for hypertension. Oxygen saturation is 96%, which is normal. Head is normocephalic and atraumatic. PERRLA, EOMI. Oropharynx is clear. Neck is nontender and supple without adenopathy or JVD. Back is nontender and there is no CVA tenderness. Lungs have fine bibasilar rales without wheezes or rhonchi. Chest is nontender. Heart has regular rate and rhythm without murmur. Abdomen is soft, flat, nontender without masses or hepatosplenomegaly and peristalsis is normoactive. Extremities have 1+ edema, full range of motion is present. Skin is warm and dry without rash. Neurologic: Mental status is normal, cranial nerves are intact, there are no motor or sensory deficits.  ED Treatments / Results  Labs (all labs ordered are listed, but only abnormal results are displayed) Labs Reviewed  BASIC METABOLIC PANEL - Abnormal; Notable for the following components:      Result Value   Glucose, Bld 146 (*)    BUN 32 (*)    Creatinine, Ser 1.76 (*)    Calcium 8.1 (*)    GFR calc non Af Amer 40 (*)    GFR calc Af Amer 46 (*)    All other components within normal limits  BRAIN NATRIURETIC  PEPTIDE - Abnormal; Notable for the following components:   B Natriuretic Peptide 570.0 (*)    All other components within normal limits  TROPONIN I - Abnormal; Notable for the following components:   Troponin I 0.03 (*)    All other components within normal limits  CBC WITH DIFFERENTIAL/PLATELET - Abnormal; Notable for the following components:   RBC 3.88 (*)    Hemoglobin 10.6 (*)    HCT 33.0 (*)    All other components within normal limits  TROPONIN I    EKG  EKG Interpretation  Date/Time:  Wednesday March 08 2017 19:55:37 EST Ventricular Rate:  88 PR Interval:  164 QRS Duration: 92 QT Interval:  376 QTC Calculation: 454 R Axis:   -23 Text Interpretation:  Normal sinus rhythm Anterior infarct , age undetermined Abnormal ECG When compared with ECG of 06/08/2016, QT has shortened Reconfirmed by Delora Fuel ([id number]) on 03/08/2017 11:30:58 PM       Radiology Dg Chest 2 View  Result Date: 03/08/2017 CLINICAL DATA:  Shortness of breath with cough EXAM: CHEST  2 VIEW COMPARISON:  06/01/2016, 11/16/2015 FINDINGS: Stable pleural and parenchymal scarring in the right base. No acute consolidation or effusion. Mild cardiomegaly. No pneumothorax. IMPRESSION: 1. Chronic right pleural and parenchymal disease at the right base. 2. Cardiomegaly without overt failure Electronically Signed   By: Donavan Foil M.D.   On: 03/08/2017 20:17    Procedures Procedures (including critical care time)  Medications Ordered in ED Medications  aspirin chewable tablet 324 mg (not administered)  furosemide (LASIX) injection 80 mg (not administered)  albuterol (PROVENTIL) (2.5 MG/3ML) 0.083% nebulizer solution 5 mg (5 mg Nebulization Given 03/08/17 2311)     Initial Impression / Assessment and Plan / ED Course  I have reviewed the triage vital signs and the nursing notes.  Pertinent labs & imaging results that were available during my care of the patient were reviewed by me and considered in my  medical decision making (see chart for details).  Cough, dyspnea, chest discomfort.  This could certainly be from bronchitis or  pneumonia, but also consistent with congestive heart failure and worrisome for possible angina.  Laboratory work had been done before I saw the patient, and BNP is elevated at 570 and troponin is borderline elevated at 0.03.  Renal insufficiency and anemia are unchanged from baseline.  BNP is elevated from baseline.  ECG shows no acute changes.  Chest x-ray does show cardiomegaly with some fluid in the fissures suspicious for CHF.  He is given aspirin and intravenous furosemide and will be kept in the ED for repeat troponin.  Old records are reviewed, and he has prior hospitalizations for CHF and angina.  Repeat troponin is normal.  He had good diuresis with above-noted treatment.  He states his breathing is back to baseline.  He was ambulated in the emergency department with no dyspnea and no oxygen desaturation.  He is discharged with instructions to double his furosemide dose for 3 days, follow-up with the heart failure clinic.  Return precautions discussed.  Final Clinical Impressions(s) / ED Diagnoses   Final diagnoses:  Acute on chronic combined systolic and diastolic congestive heart failure (HCC)  Chest discomfort  Renal insufficiency  Normochromic normocytic anemia    ED Discharge Orders    None       Delora Fuel, MD 28/20/81 [phone]

## 2017-03-08 NOTE — Telephone Encounter (Signed)
The patient called the answering service after-hours today. He is a 69M with chronic combined systolic and diastolic CHF last EF <21% by cath and 30-35% by echo 05/2016, HTN, HLD, DM, CKD III and CAD. CM previously felt NICM as EF 45% in 2005 with nonobstructive disease, most recent right and leftheart cath on 06/03/16 with 70-75% stenosis in the proximal and mid LAD,90% ostial stenosis in the 1st diagonal, and 60-70% stenosis in the mid RCA. RHC showed Fick C.O. 5.86/index 2.74. PA mean 33 mm Hg, LVEDP 25 mm Hg. EF by cath appears to be <25%. Medical therapy was recommended in the absence of ACS, and PCI was felt to be high risk with severe cardiomyopathy. CVTS was consulted and felt that CABG would not benefit him as he had NICM prior to his CAD. Notes indicate frequent adjustments to diuretic, intermittent dietary noncompliance with sodium and prior phone call to after hours service. Home diuretic dose is 60mg  Lasix of 40mg .  He called answering service reporting worsening dyspnea - has been eating more salt recently. He also reports a new tightness in his chest and back that he has not felt before. The chest pain has been waxing and waning. He does not typically get chest/back pain with his HF symptoms. Given cardiac history/extensive CAD have recommended he proceed to ED for evaluation. He was reluctant but agrees to proceed. He does not wish to call EMS but states he will get someone to take him to the hospital. The patient verbalized understanding and gratitude. Dayna Dunn PA-C

## 2017-03-08 NOTE — ED Notes (Signed)
RT notified that pt has a breathing treatment ordered.

## 2017-03-09 LAB — TROPONIN I: Troponin I: <0.03 ng/mL (ref <0.03)

## 2017-03-09 NOTE — Discharge Instructions (Signed)
Increase your Lasix to 6 pills in the morning, 4 pills in the evening for the next three days. Then go back to your current dose of 3 pills in the morning and 2 pills in the evening.  Return if symptoms are getting worse.

## 2017-03-09 NOTE — ED Notes (Signed)
While Ambulating Pt. O2 remained constant around 93.

## 2017-03-16 ENCOUNTER — Encounter (HOSPITAL_COMMUNITY): Payer: Self-pay

## 2017-03-16 ENCOUNTER — Ambulatory Visit (HOSPITAL_COMMUNITY)
Admission: RE | Admit: 2017-03-16 | Discharge: 2017-03-16 | Disposition: A | Discharge status: Home / Self Care | Payer: Medicare Other | Source: Ambulatory Visit | Attending: Internal Medicine | Admitting: Internal Medicine

## 2017-03-16 VITALS — BP 158/90 | HR 85 | Wt 228.0 lb

## 2017-03-16 DIAGNOSIS — I2510 Atherosclerotic heart disease of native coronary artery without angina pectoris: Secondary | ICD-10-CM | POA: Diagnosis not present

## 2017-03-16 DIAGNOSIS — Z951 Presence of aortocoronary bypass graft: Secondary | ICD-10-CM | POA: Diagnosis not present

## 2017-03-16 DIAGNOSIS — Z7984 Long term (current) use of oral hypoglycemic drugs: Secondary | ICD-10-CM | POA: Diagnosis not present

## 2017-03-16 DIAGNOSIS — I5042 Chronic combined systolic (congestive) and diastolic (congestive) heart failure: Secondary | ICD-10-CM | POA: Insufficient documentation

## 2017-03-16 DIAGNOSIS — N183 Chronic kidney disease, stage 3 (moderate): Secondary | ICD-10-CM | POA: Insufficient documentation

## 2017-03-16 DIAGNOSIS — F17220 Nicotine dependence, chewing tobacco, uncomplicated: Secondary | ICD-10-CM | POA: Insufficient documentation

## 2017-03-16 DIAGNOSIS — I429 Cardiomyopathy, unspecified: Secondary | ICD-10-CM | POA: Insufficient documentation

## 2017-03-16 DIAGNOSIS — E785 Hyperlipidemia, unspecified: Secondary | ICD-10-CM | POA: Insufficient documentation

## 2017-03-16 DIAGNOSIS — I2583 Coronary atherosclerosis due to lipid rich plaque: Secondary | ICD-10-CM

## 2017-03-16 DIAGNOSIS — E1122 Type 2 diabetes mellitus with diabetic chronic kidney disease: Secondary | ICD-10-CM | POA: Diagnosis not present

## 2017-03-16 DIAGNOSIS — K219 Gastro-esophageal reflux disease without esophagitis: Secondary | ICD-10-CM | POA: Diagnosis not present

## 2017-03-16 DIAGNOSIS — Z880 Allergy status to penicillin: Secondary | ICD-10-CM | POA: Insufficient documentation

## 2017-03-16 DIAGNOSIS — Z811 Family history of alcohol abuse and dependence: Secondary | ICD-10-CM | POA: Insufficient documentation

## 2017-03-16 DIAGNOSIS — Z79899 Other long term (current) drug therapy: Secondary | ICD-10-CM | POA: Diagnosis not present

## 2017-03-16 DIAGNOSIS — Z8249 Family history of ischemic heart disease and other diseases of the circulatory system: Secondary | ICD-10-CM | POA: Diagnosis not present

## 2017-03-16 DIAGNOSIS — E1142 Type 2 diabetes mellitus with diabetic polyneuropathy: Secondary | ICD-10-CM

## 2017-03-16 DIAGNOSIS — Z809 Family history of malignant neoplasm, unspecified: Secondary | ICD-10-CM | POA: Insufficient documentation

## 2017-03-16 DIAGNOSIS — Z7982 Long term (current) use of aspirin: Secondary | ICD-10-CM | POA: Insufficient documentation

## 2017-03-16 DIAGNOSIS — I130 Hypertensive heart and chronic kidney disease with heart failure and stage 1 through stage 4 chronic kidney disease, or unspecified chronic kidney disease: Secondary | ICD-10-CM | POA: Diagnosis not present

## 2017-03-16 DIAGNOSIS — F17200 Nicotine dependence, unspecified, uncomplicated: Secondary | ICD-10-CM | POA: Diagnosis not present

## 2017-03-16 DIAGNOSIS — Z882 Allergy status to sulfonamides status: Secondary | ICD-10-CM | POA: Insufficient documentation

## 2017-03-16 DIAGNOSIS — I428 Other cardiomyopathies: Secondary | ICD-10-CM | POA: Diagnosis not present

## 2017-03-16 DIAGNOSIS — F1010 Alcohol abuse, uncomplicated: Secondary | ICD-10-CM | POA: Insufficient documentation

## 2017-03-16 DIAGNOSIS — Z833 Family history of diabetes mellitus: Secondary | ICD-10-CM | POA: Diagnosis not present

## 2017-03-16 DIAGNOSIS — Z794 Long term (current) use of insulin: Secondary | ICD-10-CM

## 2017-03-16 LAB — BASIC METABOLIC PANEL
ANION GAP: 8 (ref 5–15)
BUN: 29 mg/dL — ABNORMAL HIGH (ref 6–20)
CALCIUM: 7.9 mg/dL — AB (ref 8.9–10.3)
CO2: 25 mmol/L (ref 22–32)
Chloride: 104 mmol/L (ref 101–111)
Creatinine, Ser: 2.18 mg/dL — ABNORMAL HIGH (ref 0.61–1.24)
GFR, EST AFRICAN AMERICAN: 36 mL/min — AB (ref >60)
GFR, EST NON AFRICAN AMERICAN: 31 mL/min — AB (ref >60)
Glucose, Bld: 222 mg/dL — ABNORMAL HIGH (ref 65–99)
Potassium: 4.5 mmol/L (ref 3.5–5.1)
Sodium: 137 mmol/L (ref 135–145)

## 2017-03-16 MED ORDER — HYDRALAZINE HCL 25 MG PO TABS: 25.0000 mg | ORAL_TABLET | Freq: Three times a day (TID) | ORAL | 11 refills | Status: DC

## 2017-03-16 NOTE — Patient Instructions (Signed)
START Hydralazine 25 mg, one tab three times per day   Labs today We will only contact you if something comes back abnormal or we need to make some changes. Otherwise no news is good news!   Your physician recommends that you schedule a follow-up appointment in: 3-4 weeks with Rebecca Eaton   Do the following things EVERYDAY: 1) Weigh yourself in the morning before breakfast. Write it down and keep it in a log. 2) Take your medicines as prescribed 3) Eat low salt foods-Limit salt (sodium) to 2000 mg per day.  4) Stay as active as you can everyday 5) Limit all fluids for the day to less than 2 liters

## 2017-03-16 NOTE — Progress Notes (Signed)
Advanced Heart Failure Clinic Note   Primary Care: Dr. Alain Mendoza Primary Cardiologist: New, Dr. Aundra Mendoza   HPI: Brad Mendoza is a 61 y.o. male with a past medical history of chronic combined systolic and diastolic CHF last EF 57-84% (Dec. 2017), HTN, HLD, DM and CAD. Also with history of NICM, left heart cath in June 2005 with 40-50% mid LAD stenosis, EF 45% at that time.    Admitted on 06/01/16 with chest pain relieved with nitroglycerin and volume overload. Troponin negative. Admission weight was 230 pounds. Right and left heart cath on 06/03/16 with 70-75% stenosis in the proximal and mid LAD,90% ostial stenosis in the 1st diagonal, and  60-70% stenosis in the mid RCA. RHC showed Fick C.O. 5.86/index 2.74. PA mean 33 mm Hg, LVEDP 25 mm Hg. EF by cath appears to be <25%. Medical therapy was recommended in the absence of ACS, and PCI was felt to be high risk with severe cardiomyopathy. CVTS was consulted and felt that CABG would not benefit him as he had NICM prior to his CAD. Discharge weight 207 pounds.   He presents today for regular follow up. Overall feeling OK. He was in the ED last week with chest pressure, but had been out of his lasix for several weeks.  Troponin flat/negative. He responded well to IV lasix and has felt better since being back on. No further CP. Not watching sodium or salt.  Continues to drink beer occasionally. He forgot his medications this morning, but states this is a rarity.   Review of systems complete and found to be negative unless listed in HPI.    Brad Mendoza 05/2016 Showed 70-75% stenosis in the proximal and mid LAD,90% ostial stenosis in the 1st diagonal, and  60-70% stenosis in the mid RCA. Brad Mendoza 05/2016-  Fick C.O. 5.86/index 2.74  Past Medical History:  Diagnosis Date  . Acid reflux   . Back pain    f4 and f5  . CAD (coronary artery disease)   . CHF (congestive heart failure) ([place])    last echo was in March 2016  . Diabetes mellitus 2005  .  Diverticulosis   . H/O chest pain 2005   ECHO  . HTN (hypertension)   . Hyperlipidemia   . Nonischemic cardiomyopathy ([place])   . Onychomycosis   . Polysubstance abuse ([place])     Current Outpatient Medications  Medication Sig Dispense Refill  . carvedilol (COREG) 12.5 MG tablet TAKE ONE TABLET TWICE A DAY WITH FOOD 180 tablet 1  . furosemide (LASIX) 20 MG tablet TAKE 3 TABLETS IN THE MORNING AND TAKE 2 TABLETS IN THE EVENING AS DIRECTED 450 tablet 2  . isosorbide mononitrate (IMDUR) 60 MG 24 hr tablet Take 1.5 tablets (90 mg total) by mouth daily. 135 tablet 1  . RANEXA 500 MG 12 hr tablet TAKE ONE TABLET BY MOUTH TWICE DAILY 60 tablet 6  . rosuvastatin (CRESTOR) 10 MG tablet TAKE ONE (1) TABLET EACH DAY 90 tablet 1  . sacubitril-valsartan (ENTRESTO) 97-103 MG Take 1 tablet by mouth 2 (two) times daily. 180 tablet 3  . spironolactone (ALDACTONE) 25 MG tablet Take 0.5 tablets (12.5 mg total) by mouth daily. 45 tablet 3  . albuterol (PROVENTIL HFA;VENTOLIN HFA) 108 (90 Base) MCG/ACT inhaler Inhale 2 puffs into the lungs every 6 (six) hours as needed for wheezing or shortness of breath. 1 Inhaler 2  . aspirin EC 81 MG tablet Take 81 mg by mouth daily.    . Capsaicin (ICY  HOT ARTHRITIS THERAPY EX) Apply 1 application topically 2 (two) times daily as needed (back pain).     . fluticasone (FLONASE) 50 MCG/ACT nasal spray USE 2 SPRAYS IN EACH NOSTRIL DAILY 48 g 1  . metFORMIN (GLUCOPHAGE-XR) 500 MG 24 hr tablet Take 1 tablet (500 mg total) by mouth daily with breakfast. (Patient taking differently: Take 500 mg by mouth daily as needed (for blood sugars). ) 90 tablet 1  . Multiple Vitamins-Minerals (CENTRUM SILVER ADULT 50+ PO) Take 1 tablet by mouth daily.    . nitroGLYCERIN (NITROSTAT) 0.4 MG SL tablet DISSOLVE 1 TAB UNDER TOUNGE FOR CHEST PAIN. MAY REPEAT EVERY 5 MINUTES FOR 3 DOSES. IF NO RELIEF CALL 911 OR GO TO ER 25 tablet 2  . Omega-3 Fatty Acids (FISH OIL) 1000 MG CPDR Take 1 capsule by mouth  daily.    . pantoprazole (PROTONIX) 40 MG tablet TAKE ONE (1) TABLET EACH DAY 90 tablet 1  . terbinafine (LAMISIL) 1 % cream Apply 1 application topically 2 (two) times daily. Apply to both feet and between toes 30 g 1  . TOUJEO SOLOSTAR 300 UNIT/ML SOPN Inject 15-40 Units into the skin See admin instructions. Inject 40 units in the morning and 15 units at night    . ULTICARE MINI PEN NEEDLES 31G X 6 MM MISC 4 TIMES DAILY 100 each 2   No current facility-administered medications for this encounter.     Allergies  Allergen Reactions  . Sulfa Antibiotics Shortness Of Breath  . Penicillins Rash    Has patient had a PCN reaction causing immediate rash, facial/tongue/throat swelling, SOB or lightheadedness with hypotension: No Has patient had a PCN reaction causing severe rash involving mucus membranes or skin necrosis: No Has patient had a PCN reaction that required hospitalization No Has patient had a PCN reaction occurring within the last 10 years: No If all of the above answers are "NO", then may proceed with Cephalosporin use.       Social History   Socioeconomic History  . Marital status: Married    Spouse name: Not on file  . Number of children: Not on file  . Years of education: Not on file  . Highest education level: Not on file  Social Needs  . Financial resource strain: Not on file  . Food insecurity - worry: Not on file  . Food insecurity - inability: Not on file  . Transportation needs - medical: Not on file  . Transportation needs - non-medical: Not on file  Occupational History  . Occupation: disabled  Tobacco Use  . Smoking status: Never Smoker  . Smokeless tobacco: Current User    Types: Snuff  . Tobacco comment: dips snuff about 6 cans/week for 3 years  Substance and Sexual Activity  . Alcohol use: Yes    Alcohol/week: 0.6 oz    Types: 1 Cans of beer per week    Comment: occ - has cut down, now once every 2-3 months  . Drug use: Yes    Types: Marijuana     Comment: occasional use - smoking less  . Sexual activity: Yes  Other Topics Concern  . Not on file  Social History Narrative  . Not on file      Family History  Problem Relation Age of Onset  . Hypertension Father   . Diabetes Father   . Cancer Mother   . Alcohol abuse Brother   . Colon cancer Neg Hx     Vitals:  03/16/17 1551 03/16/17 1605  BP: (!) 186/124 (!) 158/90  Pulse: 85   SpO2: 95%   Weight: 228 lb (103.4 kg)     PHYSICAL EXAM: General: Well appearing. No resp difficulty. HEENT: Normal Neck: Supple. JVP 5-6. Carotids 2+ bilat; no bruits. No thyromegaly or nodule noted. Cor: PMI nondisplaced. RRR, No M/G/R noted Lungs: CTAB, normal effort. Abdomen: Soft, non-tender, non-distended, no HSM. No bruits or masses. +BS  Extremities: No cyanosis, clubbing, or rash. Trace to 1+ edema.   Neuro: Alert & orientedx3, cranial nerves grossly intact. moves all 4 extremities w/o difficulty. Affect pleasant   ASSESSMENT & PLAN: 1. 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 He had preexisting NICM diagnosed in 2005 when his EF was 35% and had normal cors. Echo in 05/2016 with EF 30-35% and cath showed  70-75% stenosis in the proximal and mid LAD,90% ostial stenosis in the 1st diagonal, and  60-70% stenosis in the mid RCA. [facility] 05/2016-  Fick C.O. 5.86/index 2.74 - NYHA II-III - Volume status stable today back on lasix. - Continue lasix 60 mg q am and 40 mg q pm. Can take 60 mg BID as needed.  - Continue Entresto 97/103 mg BID.  - Continue Coreg 12.5 mg BID - Continue isosorbide 90 mg daily.  - Add hydralazine 25 mg TID.  - Continue spiro 25mg  daily - Reinforced fluid restriction to < 2 L daily, sodium restriction to less than 2000 mg daily, and the importance of daily weights.    2. CAD - [facility] 05/2016 showed 70-75% stenosis in the proximal and mid LAD,90% ostial stenosis in the 1st diagonal, and  60-70% stenosis in the mid RCA. [place] 05/2016-  Fick C.O. 5.86/index 2.74 Medical  management recommended.  - PCI to LAD felt to be high risk in the setting of cardiomyopathy, CVTS consulted felt that CABG would not improve EF as he has pre existing CM.  - No s/s of ischemia.    - Continue isosorbide and Ranexa - Continue ASA and statin.   3. ETOH abuse - Encouraged complete cessation.   4. Tobacco abuse - Chews tobacco. Encouraged complete cessation.   5. HTN - Elevated on arrival but improved with rest. Forgot his medication as above. Adding hydralazine as above.   6. CKD stage III - BMET today.   7. DM - Per PCP.   Meds and labs as above. RTC 3-4 weeks. Encouraged to check BP at home.   Shirley Friar, PA-C 03/16/17   Greater than 50% of the 25 minute visit was spent in counseling/coordination of care regarding disease state education, salt/fluid restriction, sliding scale diuretics, and medication compliance.

## 2017-03-20 ENCOUNTER — Other Ambulatory Visit: Payer: Self-pay

## 2017-03-20 ENCOUNTER — Other Ambulatory Visit: Payer: Self-pay | Admitting: Licensed Clinical Social Worker

## 2017-03-20 NOTE — Patient Outreach (Signed)
[facility]) Care Management  [place]  03/20/2017   CODEN FRANCHI 1956-01-02 [id number]  Subjective:  RN Health coach contacted the patient for education on reading nutrition labels.  HIPAA verified.   Nunzio Cobbs LCSW  spoke with the patient earlier today wanting information on nutrition labels.  He asked me if I would give him a call.  The patient is concerned about his salt intake.  He had a visit to the emergency room from an exacerbation of his congestive heart failure.  He does not want this to happen again. RN Health Coach talked with the patient about reading  nutrition labels.  Patient verbalized understanding.  RN Health Coach advised the patient to call and leave a message for his cardiologist nurse to see what should his salt and fluid intake be.  He needs this so he can better judge how much he can have on a daily basis. The patient stated that he would call.    Objective:   Encounter Medications:  Outpatient Encounter Medications as of 03/20/2017  Medication Sig Note  . albuterol (PROVENTIL HFA;VENTOLIN HFA) 108 (90 Base) MCG/ACT inhaler Inhale 2 puffs into the lungs every 6 (six) hours as needed for wheezing or shortness of breath. 02/29/2016: Uses PRN  . aspirin EC 81 MG tablet Take 81 mg by mouth daily.   . Capsaicin (ICY HOT ARTHRITIS THERAPY EX) Apply 1 application topically 2 (two) times daily as needed (back pain).    . carvedilol (COREG) 12.5 MG tablet TAKE ONE TABLET TWICE A DAY WITH FOOD   . fluticasone (FLONASE) 50 MCG/ACT nasal spray USE 2 SPRAYS IN EACH NOSTRIL DAILY   . furosemide (LASIX) 20 MG tablet TAKE 3 TABLETS IN THE MORNING AND TAKE 2 TABLETS IN THE EVENING AS DIRECTED   . hydrALAZINE (APRESOLINE) 25 MG tablet Take 1 tablet (25 mg total) by mouth 3 (three) times daily.   . isosorbide mononitrate (IMDUR) 60 MG 24 hr tablet Take 1.5 tablets (90 mg total) by mouth daily.   . metFORMIN (GLUCOPHAGE-XR) 500 MG 24 hr tablet Take 1 tablet  (500 mg total) by mouth daily with breakfast. (Patient taking differently: Take 500 mg by mouth daily as needed (for blood sugars). )   . Multiple Vitamins-Minerals (CENTRUM SILVER ADULT 50+ PO) Take 1 tablet by mouth daily.   . nitroGLYCERIN (NITROSTAT) 0.4 MG SL tablet DISSOLVE 1 TAB UNDER TOUNGE FOR CHEST PAIN. MAY REPEAT EVERY 5 MINUTES FOR 3 DOSES. IF NO RELIEF CALL 911 OR GO TO ER   . Omega-3 Fatty Acids (FISH OIL) 1000 MG CPDR Take 1 capsule by mouth daily.   . pantoprazole (PROTONIX) 40 MG tablet TAKE ONE (1) TABLET EACH DAY   . RANEXA 500 MG 12 hr tablet TAKE ONE TABLET BY MOUTH TWICE DAILY   . rosuvastatin (CRESTOR) 10 MG tablet TAKE ONE (1) TABLET EACH DAY   . sacubitril-valsartan (ENTRESTO) 97-103 MG Take 1 tablet by mouth 2 (two) times daily.   [name] spironolactone (ALDACTONE) 25 MG tablet Take 0.5 tablets (12.5 mg total) by mouth daily.   [name] terbinafine (LAMISIL) 1 % cream Apply 1 application topically 2 (two) times daily. Apply to both feet and between toes   . TOUJEO SOLOSTAR 300 UNIT/ML SOPN Inject 15-40 Units into the skin See admin instructions. Inject 40 units in the morning and 15 units at night 12/14/2016: The patient states that he takes 40 units in the am and 15 units in the pm  .  ULTICARE MINI PEN NEEDLES 31G X 6 MM MISC 4 TIMES DAILY    No facility-administered encounter medications on file as of 03/20/2017.     Functional Status:  In your present state of health, do you have any difficulty performing the following activities: 12/14/2016 09/26/2016  Hearing? N N  Vision? Y N  Difficulty concentrating or making decisions? N N  Walking or climbing stairs? N N  Dressing or bathing? Y N  Doing errands, shopping? Y N  Preparing Food and eating ? N N  Using the Toilet? N N  In the past six months, have you accidently leaked urine? N N  Do you have problems with loss of bowel control? N N  Managing your Medications? N N  Comment - -  Managing your Finances? N N  Housekeeping  or managing your Housekeeping? N N  Some recent data might be hidden    Fall/Depression Screening: Fall Risk  03/20/2017 03/03/2017 01/16/2017  Falls in the past year? No No No  Risk for fall due to : - - -   PHQ 2/9 Scores 12/14/2016 12/02/2016 09/26/2016 09/14/2016 09/01/2016 08/22/2016 07/07/2016  PHQ - 2 Score 1 0 0 0 0 0 0    Assessment: Patient continues to benefit from health coach outreach for disease management and support.    THN CM Care Plan Problem One     Most Recent Value  Care Plan Problem One  Knowledge deficit related to CHF  Role Documenting the Problem One  [place] for Problem One  Active  THN Long Term Goal   pt will verbalize better understanding of disease process CHF to facilitate better outcomes and avoid hospitalization within 90 days.  THN Long Term Goal Start Date  03/20/17  Interventions for Problem One Long Term Goal  RN Health dicussed with the patient about his salt intake and how to read nutrition labels  THN CM Short Term Goal #1   Pt will verbalize CHF red zone within 30 days.  THN CM Short Term Goal #1 Start Date  12/14/16  [facility] CM Short Term Goal #1 Met Date  03/03/17  Interventions for Short Term Goal #1  RN Health Coach discussed copd action plan.   THN CM Short Term Goal #2   Pt will verbalize following low salt diet within the next 30 days  THN CM Short Term Goal #2 Start Date  12/14/16  [facility] CM Short Term Goal #2 Met Date  03/03/17  Interventions for Short Term Goal #2  [place] discussed diet over the holidays. Talked with the patient about watching his salt intake and some of the foods he ate.     Plan:  RN health Coach will contact the patient in the month of February and the patient agrees to the next outreach.  RN Health Coach mail the patient information on nutrition labels for later review.  RN Health Coach advised the patient if he has any more concerns to give me a call.  [name] RN, BSN, [place] Direct Dial:  [phone] Fax: [phone]

## 2017-03-20 NOTE — Patient Outreach (Signed)
Assessment;  CSW spoke via phone with client.  CSW verified client identity. CSW received verbal permission from client on 03/20/17 for CSW to speak with client about client needs and status. Client sees Dr. Evette Doffing as primary care doctor. Client continues to receive telephonic support with [place] coach, Brad Mendoza. Client has reported that he does not have a vehicle for transport. Thus, client relies on family and friends to transport client to and from client's scheduledl medical appointments. CSW has talked several times with client about transport assistance services with Brad Mendoza. Client said he has his prescribed medications and is taking medications as prescribed.  CSW and client spoke of client care plan. CSW encouraged Brad Mendoza to communicate with CSW in next 30 days to discuss financial needs of client and to discuss financial resources for client in the area. Client has financial challenges. Client has set up monthly payment plan for client with [facility] Client said he is also making monthly payments to primary care doctor to pay on client's current bill owed to primary care doctor.  Brad Mendoza, has talked with client about client diet and healthy eating for client. Client spoke of financial challenges of client. Client said he is scheduled to have appointment with primary care doctor in February of 2019. Client said it is sometimes difficult for him to make copay payments to his medical provider appointments. CSW thanked client for phone call with CSW on 03/20/17. CSW encouraged Brad Mendoza to call CSW at [phone] as needed to discuss social work needs of client.    Plan:  Client to communicate with CSW in next 30 days to discuss financial needs of client and to discuss financial resources for client in the area.   CSW to collaborate Brad Mendoza, Brad Arms, RN, in monitoring needs of client.  CSW to call client in 4 weeks to assess  client needs at that time.  Brad Mendoza.Brad Mendoza MSW, LCSW Licensed Clinical Social Worker Brad Mendoza Care Management [phone]

## 2017-03-29 ENCOUNTER — Ambulatory Visit: Payer: Medicare Other | Admitting: Pediatrics

## 2017-03-30 ENCOUNTER — Ambulatory Visit (HOSPITAL_COMMUNITY)
Admission: RE | Admit: 2017-03-30 | Discharge: 2017-03-30 | Disposition: A | Discharge status: Home / Self Care | Payer: Medicare Other | Source: Ambulatory Visit | Attending: Internal Medicine | Admitting: Internal Medicine

## 2017-03-30 ENCOUNTER — Encounter (HOSPITAL_COMMUNITY): Payer: Self-pay

## 2017-03-30 VITALS — BP 142/84 | HR 87 | Wt 227.0 lb

## 2017-03-30 DIAGNOSIS — Z79899 Other long term (current) drug therapy: Secondary | ICD-10-CM | POA: Insufficient documentation

## 2017-03-30 DIAGNOSIS — N183 Chronic kidney disease, stage 3 (moderate): Secondary | ICD-10-CM | POA: Diagnosis not present

## 2017-03-30 DIAGNOSIS — E1142 Type 2 diabetes mellitus with diabetic polyneuropathy: Secondary | ICD-10-CM

## 2017-03-30 DIAGNOSIS — I2583 Coronary atherosclerosis due to lipid rich plaque: Secondary | ICD-10-CM | POA: Diagnosis not present

## 2017-03-30 DIAGNOSIS — I5042 Chronic combined systolic (congestive) and diastolic (congestive) heart failure: Secondary | ICD-10-CM | POA: Diagnosis not present

## 2017-03-30 DIAGNOSIS — K219 Gastro-esophageal reflux disease without esophagitis: Secondary | ICD-10-CM | POA: Insufficient documentation

## 2017-03-30 DIAGNOSIS — I255 Ischemic cardiomyopathy: Secondary | ICD-10-CM | POA: Diagnosis not present

## 2017-03-30 DIAGNOSIS — Z794 Long term (current) use of insulin: Secondary | ICD-10-CM | POA: Insufficient documentation

## 2017-03-30 DIAGNOSIS — I130 Hypertensive heart and chronic kidney disease with heart failure and stage 1 through stage 4 chronic kidney disease, or unspecified chronic kidney disease: Secondary | ICD-10-CM | POA: Diagnosis not present

## 2017-03-30 DIAGNOSIS — I2510 Atherosclerotic heart disease of native coronary artery without angina pectoris: Secondary | ICD-10-CM | POA: Insufficient documentation

## 2017-03-30 DIAGNOSIS — I5022 Chronic systolic (congestive) heart failure: Secondary | ICD-10-CM | POA: Diagnosis not present

## 2017-03-30 DIAGNOSIS — Z882 Allergy status to sulfonamides status: Secondary | ICD-10-CM | POA: Insufficient documentation

## 2017-03-30 DIAGNOSIS — F17200 Nicotine dependence, unspecified, uncomplicated: Secondary | ICD-10-CM | POA: Diagnosis not present

## 2017-03-30 DIAGNOSIS — E1122 Type 2 diabetes mellitus with diabetic chronic kidney disease: Secondary | ICD-10-CM | POA: Insufficient documentation

## 2017-03-30 DIAGNOSIS — Z880 Allergy status to penicillin: Secondary | ICD-10-CM | POA: Insufficient documentation

## 2017-03-30 DIAGNOSIS — Z7982 Long term (current) use of aspirin: Secondary | ICD-10-CM | POA: Diagnosis not present

## 2017-03-30 DIAGNOSIS — Z811 Family history of alcohol abuse and dependence: Secondary | ICD-10-CM | POA: Diagnosis not present

## 2017-03-30 DIAGNOSIS — Z833 Family history of diabetes mellitus: Secondary | ICD-10-CM | POA: Insufficient documentation

## 2017-03-30 DIAGNOSIS — F1010 Alcohol abuse, uncomplicated: Secondary | ICD-10-CM | POA: Insufficient documentation

## 2017-03-30 DIAGNOSIS — Z8249 Family history of ischemic heart disease and other diseases of the circulatory system: Secondary | ICD-10-CM | POA: Insufficient documentation

## 2017-03-30 DIAGNOSIS — Z7951 Long term (current) use of inhaled steroids: Secondary | ICD-10-CM | POA: Insufficient documentation

## 2017-03-30 DIAGNOSIS — I428 Other cardiomyopathies: Secondary | ICD-10-CM | POA: Diagnosis not present

## 2017-03-30 DIAGNOSIS — E785 Hyperlipidemia, unspecified: Secondary | ICD-10-CM | POA: Insufficient documentation

## 2017-03-30 DIAGNOSIS — F17220 Nicotine dependence, chewing tobacco, uncomplicated: Secondary | ICD-10-CM | POA: Insufficient documentation

## 2017-03-30 LAB — BASIC METABOLIC PANEL
Anion gap: 9 (ref 5–15)
BUN: 29 mg/dL — AB (ref 6–20)
CHLORIDE: 108 mmol/L (ref 101–111)
CO2: 23 mmol/L (ref 22–32)
CREATININE: 1.73 mg/dL — AB (ref 0.61–1.24)
Calcium: 8 mg/dL — ABNORMAL LOW (ref 8.9–10.3)
GFR calc Af Amer: 47 mL/min — ABNORMAL LOW (ref >60)
GFR calc non Af Amer: 41 mL/min — ABNORMAL LOW (ref >60)
Glucose, Bld: 200 mg/dL — ABNORMAL HIGH (ref 65–99)
Potassium: 4.6 mmol/L (ref 3.5–5.1)
SODIUM: 140 mmol/L (ref 135–145)

## 2017-03-30 MED ORDER — HYDRALAZINE HCL 50 MG PO TABS: 50.0000 mg | ORAL_TABLET | Freq: Three times a day (TID) | ORAL | 11 refills | Status: DC

## 2017-03-30 NOTE — Progress Notes (Signed)
Advanced Heart Failure Clinic Note   Primary Care: Dr. Alain Honey PCP-Cardiologist:   HPI: Brad Mendoza is a 62 y.o. male with a past medical history of chronic combined systolic and diastolic CHF last EF 16-10% (Dec. 2017), HTN, HLD, DM and CAD. Also with history of NICM, left heart cath in June 2005 with 40-50% mid LAD stenosis, EF 45% at that time.    Admitted on 06/01/16 with chest pain relieved with nitroglycerin and volume overload. Troponin negative. Admission weight was 230 pounds. Right and left heart cath on 06/03/16 with 70-75% stenosis in the proximal and mid LAD,90% ostial stenosis in the 1st diagonal, and  60-70% stenosis in the mid RCA. RHC showed Fick C.O. 5.86/index 2.74. PA mean 33 mm Hg, LVEDP 25 mm Hg. EF by cath appears to be <25%. Medical therapy was recommended in the absence of ACS, and PCI was felt to be high risk with severe cardiomyopathy. CVTS was consulted and felt that CABG would not benefit him as he had NICM prior to his CAD. Discharge weight 207 pounds.   He presents today for follow up. He has been slightly more SOB. More swollen in legs and slightly orthopneic.   He still has occasional chest pressure/tightness, but only when he is volume overloaded, and no change in quality, duration, frequency, or provocation. Goes away with rest, and usually only occurs in the morning. Continues to have occasional ETOH. Taking all medication as directed. Denies lightheadedness or dizziness.   Review of systems complete and found to be negative unless listed in HPI.    [facility] 05/2016 Showed 70-75% stenosis in the proximal and mid LAD,90% ostial stenosis in the 1st diagonal, and  60-70% stenosis in the mid RCA. [place] 05/2016-  Fick C.O. 5.86/index 2.74  Past Medical History:  Diagnosis Date  . Acid reflux   . Back pain    f4 and f5  . CAD (coronary artery disease)   . CHF (congestive heart failure) ([place])    last echo was in March 2016  . Diabetes mellitus 2005  .  Diverticulosis   . H/O chest pain 2005   ECHO  . HTN (hypertension)   . Hyperlipidemia   . Nonischemic cardiomyopathy ([place])   . Onychomycosis   . Polysubstance abuse ([place])     Current Outpatient Medications  Medication Sig Dispense Refill  . albuterol (PROVENTIL HFA;VENTOLIN HFA) 108 (90 Base) MCG/ACT inhaler Inhale 2 puffs into the lungs every 6 (six) hours as needed for wheezing or shortness of breath. 1 Inhaler 2  . aspirin EC 81 MG tablet Take 81 mg by mouth daily.    . Capsaicin (ICY HOT ARTHRITIS THERAPY EX) Apply 1 application topically 2 (two) times daily as needed (back pain).     . carvedilol (COREG) 12.5 MG tablet TAKE ONE TABLET TWICE A DAY WITH FOOD 180 tablet 1  . fluticasone (FLONASE) 50 MCG/ACT nasal spray USE 2 SPRAYS IN EACH NOSTRIL DAILY 48 g 1  . furosemide (LASIX) 20 MG tablet TAKE 3 TABLETS IN THE MORNING AND TAKE 2 TABLETS IN THE EVENING AS DIRECTED 450 tablet 2  . hydrALAZINE (APRESOLINE) 25 MG tablet Take 1 tablet (25 mg total) by mouth 3 (three) times daily. 90 tablet 11  . isosorbide mononitrate (IMDUR) 60 MG 24 hr tablet Take 1.5 tablets (90 mg total) by mouth daily. 135 tablet 1  . metFORMIN (GLUCOPHAGE-XR) 500 MG 24 hr tablet Take 1 tablet (500 mg total) by mouth daily with breakfast. (Patient  taking differently: Take 500 mg by mouth daily as needed (for blood sugars). ) 90 tablet 1  . Multiple Vitamins-Minerals (CENTRUM SILVER ADULT 50+ PO) Take 1 tablet by mouth daily.    . nitroGLYCERIN (NITROSTAT) 0.4 MG SL tablet DISSOLVE 1 TAB UNDER TOUNGE FOR CHEST PAIN. MAY REPEAT EVERY 5 MINUTES FOR 3 DOSES. IF NO RELIEF CALL 911 OR GO TO ER 25 tablet 2  . Omega-3 Fatty Acids (FISH OIL) 1000 MG CPDR Take 1 capsule by mouth daily.    . pantoprazole (PROTONIX) 40 MG tablet TAKE ONE (1) TABLET EACH DAY 90 tablet 1  . RANEXA 500 MG 12 hr tablet TAKE ONE TABLET BY MOUTH TWICE DAILY 60 tablet 6  . rosuvastatin (CRESTOR) 10 MG tablet TAKE ONE (1) TABLET EACH DAY 90 tablet 1    . sacubitril-valsartan (ENTRESTO) 97-103 MG Take 1 tablet by mouth 2 (two) times daily. 180 tablet 3  . spironolactone (ALDACTONE) 25 MG tablet Take 0.5 tablets (12.5 mg total) by mouth daily. 45 tablet 3  . terbinafine (LAMISIL) 1 % cream Apply 1 application topically 2 (two) times daily. Apply to both feet and between toes 30 g 1  . TOUJEO SOLOSTAR 300 UNIT/ML SOPN Inject 15-40 Units into the skin See admin instructions. Inject 40 units in the morning and 15 units at night    . ULTICARE MINI PEN NEEDLES 31G X 6 MM MISC 4 TIMES DAILY 100 each 2   No current facility-administered medications for this encounter.     Allergies  Allergen Reactions  . Sulfa Antibiotics Shortness Of Breath  . Penicillins Rash    Has patient had a PCN reaction causing immediate rash, facial/tongue/throat swelling, SOB or lightheadedness with hypotension: No Has patient had a PCN reaction causing severe rash involving mucus membranes or skin necrosis: No Has patient had a PCN reaction that required hospitalization No Has patient had a PCN reaction occurring within the last 10 years: No If all of the above answers are "NO", then may proceed with Cephalosporin use.    Social History   Socioeconomic History  . Marital status: Married    Spouse name: Not on file  . Number of children: Not on file  . Years of education: Not on file  . Highest education level: Not on file  Social Needs  . Financial resource strain: Not on file  . Food insecurity - worry: Not on file  . Food insecurity - inability: Not on file  . Transportation needs - medical: Not on file  . Transportation needs - non-medical: Not on file  Occupational History  . Occupation: disabled  Tobacco Use  . Smoking status: Never Smoker  . Smokeless tobacco: Current User    Types: Snuff  . Tobacco comment: dips snuff about 6 cans/week for 3 years  Substance and Sexual Activity  . Alcohol use: Yes    Alcohol/week: 0.6 oz    Types: 1 Cans of  beer per week    Comment: occ - has cut down, now once every 2-3 months  . Drug use: Yes    Types: Marijuana    Comment: occasional use - smoking less  . Sexual activity: Yes  Other Topics Concern  . Not on file  Social History Narrative  . Not on file    Family History  Problem Relation Age of Onset  . Hypertension Father   . Diabetes Father   . Cancer Mother   . Alcohol abuse Brother   . Colon cancer  Neg Hx    Vitals:   03/30/17 1517  BP: (!) 142/84  Pulse: 87  SpO2: 98%  Weight: 227 lb (103 kg)   Wt Readings from Last 3 Encounters:  03/30/17 227 lb (103 kg)  03/16/17 228 lb (103.4 kg)  03/08/17 221 lb (100.2 kg)    PHYSICAL EXAM: General: Well appearing. No resp difficulty. HEENT: Normal Neck: Supple. JVP 7-8 cm. Carotids 2+ bilat; no bruits. No thyromegaly or nodule noted. Cor: PMI nondisplaced. RRR, No M/G/R noted Lungs: CTAB, normal effort. Abdomen: Soft, non-tender, non-distended, no HSM. No bruits or masses. +BS  Extremities: No cyanosis, clubbing, or rash. 1-2+ edema.   Neuro: Alert & orientedx3, cranial nerves grossly intact. moves all 4 extremities w/o difficulty. Affect pleasant   ASSESSMENT & PLAN: 1. 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 He had preexisting NICM diagnosed in 2005 when his EF was 35% and had normal cors. Echo in 05/2016 with EF 30-35% and cath showed  70-75% stenosis in the proximal and mid LAD,90% ostial stenosis in the 1st diagonal, and  60-70% stenosis in the mid RCA. [place] 05/2016-  Fick C.O. 5.86/index 2.74 - NYHA II-III - Volume status stable today back on lasix. - Continue lasix 60 mg q am and 40 mg q pm. Should take 60 mg BID for next 2 days.  - Continue Entresto 97/103 mg BID.  - Continue Coreg 12.5 mg BID - Continue isosorbide 90 mg daily.  - Increase hydralazine to 50 mg TID.  - Continue spiro 25mg  daily - Reinforced fluid restriction to < 2 L daily, sodium restriction to less than 2000 mg daily, and the importance of daily  weights.     2. CAD - [facility] 05/2016 showed 70-75% stenosis in the proximal and mid LAD,90% ostial stenosis in the 1st diagonal, and  60-70% stenosis in the mid RCA. [place] 05/2016-  Fick C.O. 5.86/index 2.74 Medical management recommended.  - PCI to LAD felt to be high risk in the setting of cardiomyopathy, CVTS consulted felt that CABG would not improve EF as he has pre existing CM.  - Occasional chest pressure, but no overt or exertional chest pain.  - Continue isosorbide and Ranexa - Continue ASA and statin.   3. ETOH abuse - Encouraged complete cessation.   4. Tobacco abuse - Chews tobacco.  - Encouraged complete cessation.   5. HTN - Meds as above.   6. CKD stage III - BMET today.   7. DM - Per PCP.   Meds and labs as above. RTC 2 months with Echo.   Shirley Friar, PA-C 03/30/17   Greater than 50% of the 25 minute visit was spent in counseling/coordination of care regarding disease state education, salt/fluid restriction, sliding scale diuretics, and medication compliance.

## 2017-03-30 NOTE — Patient Instructions (Addendum)
INCREASE Hydralazine to 50 mg three times daily. Can double up on current 25 mg tablets you have at home (Take 2 tabs three times daily). New Rx has been sent to your pharmacy for 50 mg tablets (Take 1 tab three times daily).  Take Lasix 60 mg TWICE DAILY for the next 1-2 days until swelling subsides. Then reduce back to normal daily dose of 60 mg in am and 40 mg in pm.  Routine lab work today. Will notify you of abnormal results, otherwise no news is good news!  Follow up 2 months with Dr. Haroldine Laws and echocardiogram.  ___________________________________________________________ Brad Mendoza Code: 1100  Take all medication as prescribed the day of your appointment. Bring all medications with you to your appointment.  Do the following things EVERYDAY: 1) Weigh yourself in the morning before breakfast. Write it down and keep it in a log. 2) Take your medicines as prescribed 3) Eat low salt foods-Limit salt (sodium) to 2000 mg per day.  4) Stay as active as you can everyday 5) Limit all fluids for the day to less than 2 liters

## 2017-04-04 ENCOUNTER — Encounter: Payer: Self-pay | Admitting: Pediatrics

## 2017-04-07 ENCOUNTER — Other Ambulatory Visit: Payer: Self-pay

## 2017-04-07 NOTE — Patient Outreach (Signed)
[facility]) Care Management  [place]  04/07/2017   Brad Mendoza 02/12/1956 [id number]  Subjective: Telephone call to the patient for monthly assessment. HIPAA verified.  The patient states that he is doing well.  He was seen in the ED on 03/08/2017 for Acute on chronic combined systolic and diastolic congestive heart failure. The patient states that he has been having some swelling in his ankles.  He was given compression socks to wear. RN Health Coach talked with the patient about elevation of his feet when sitting. The patient verbalized understanding. The patient states that he does not weigh himself daily.  RN Health Coach talked with the patient about the benefits of checking his weight. The patient verbalized understanding.  He denies any pain or falls.  He states that he is adherent with his medications.  He is aware of his diet and trying to read food labels.  He did receive the information that I sent on reading labels and checking for sodium in foods.  He is staying active by walking and starting to work in his garden.   Objective:   Encounter Medications:  Outpatient Encounter Medications as of 04/07/2017  Medication Sig Note  . albuterol (PROVENTIL HFA;VENTOLIN HFA) 108 (90 Base) MCG/ACT inhaler Inhale 2 puffs into the lungs every 6 (six) hours as needed for wheezing or shortness of breath. 02/29/2016: Uses PRN  . aspirin EC 81 MG tablet Take 81 mg by mouth daily.   . Capsaicin (ICY HOT ARTHRITIS THERAPY EX) Apply 1 application topically 2 (two) times daily as needed (back pain).    . carvedilol (COREG) 12.5 MG tablet TAKE ONE TABLET TWICE A DAY WITH FOOD   . fluticasone (FLONASE) 50 MCG/ACT nasal spray USE 2 SPRAYS IN EACH NOSTRIL DAILY   . furosemide (LASIX) 20 MG tablet TAKE 3 TABLETS IN THE MORNING AND TAKE 2 TABLETS IN THE EVENING AS DIRECTED   . hydrALAZINE (APRESOLINE) 50 MG tablet Take 1 tablet (50 mg total) by mouth 3 (three) times daily.   .  isosorbide mononitrate (IMDUR) 60 MG 24 hr tablet Take 1.5 tablets (90 mg total) by mouth daily.   . metFORMIN (GLUCOPHAGE-XR) 500 MG 24 hr tablet Take 1 tablet (500 mg total) by mouth daily with breakfast. (Patient taking differently: Take 500 mg by mouth daily as needed (for blood sugars). )   . Multiple Vitamins-Minerals (CENTRUM SILVER ADULT 50+ PO) Take 1 tablet by mouth daily.   . nitroGLYCERIN (NITROSTAT) 0.4 MG SL tablet DISSOLVE 1 TAB UNDER TOUNGE FOR CHEST PAIN. MAY REPEAT EVERY 5 MINUTES FOR 3 DOSES. IF NO RELIEF CALL 911 OR GO TO ER   . Omega-3 Fatty Acids (FISH OIL) 1000 MG CPDR Take 1 capsule by mouth daily.   . pantoprazole (PROTONIX) 40 MG tablet TAKE ONE (1) TABLET EACH DAY   . RANEXA 500 MG 12 hr tablet TAKE ONE TABLET BY MOUTH TWICE DAILY   . rosuvastatin (CRESTOR) 10 MG tablet TAKE ONE (1) TABLET EACH DAY   . sacubitril-valsartan (ENTRESTO) 97-103 MG Take 1 tablet by mouth 2 (two) times daily.   [name] spironolactone (ALDACTONE) 25 MG tablet Take 0.5 tablets (12.5 mg total) by mouth daily.   [name] terbinafine (LAMISIL) 1 % cream Apply 1 application topically 2 (two) times daily. Apply to both feet and between toes   . TOUJEO SOLOSTAR 300 UNIT/ML SOPN Inject 15-40 Units into the skin See admin instructions. Inject 40 units in the morning and 15 units  at night 12/14/2016: The patient states that he takes 40 units in the am and 15 units in the pm  . ULTICARE MINI PEN NEEDLES 31G X 6 MM MISC 4 TIMES DAILY    No facility-administered encounter medications on file as of 04/07/2017.     Functional Status:  In your present state of health, do you have any difficulty performing the following activities: 12/14/2016 09/26/2016  Hearing? N N  Vision? Y N  Difficulty concentrating or making decisions? N N  Walking or climbing stairs? N N  Dressing or bathing? Y N  Doing errands, shopping? Y N  Preparing Food and eating ? N N  Using the Toilet? N N  In the past six months, have you accidently  leaked urine? N N  Do you have problems with loss of bowel control? N N  Managing your Medications? N N  Comment - -  Managing your Finances? N N  Housekeeping or managing your Housekeeping? N N  Some recent data might be hidden    Fall/Depression Screening: Fall Risk  04/07/2017 03/20/2017 03/03/2017  Falls in the past year? No No No  Risk for fall due to : - - -   PHQ 2/9 Scores 12/14/2016 12/02/2016 09/26/2016 09/14/2016 09/01/2016 08/22/2016 07/07/2016  PHQ - 2 Score 1 0 0 0 0 0 0    Assessment: Patient continues to benefit from health coach outreach for disease management and support.   THN CM Care Plan Problem One     Most Recent Value  THN Long Term Goal   pt will verbalize better understanding of disease process CHF to facilitate better outcomes and avoid hospitalization within 90 days.  THN Long Term Goal Start Date  04/07/17  Interventions for Problem One Long Term Goal  RN Health dicussed with the patient about his diease process  THN CM Short Term Goal #1   Pt will verbalize CHF red zone within 30 days.  THN CM Short Term Goal #1 Start Date  04/07/17  [facility] CM Short Term Goal #1 Met Date  04/07/17  Interventions for Short Term Goal #1  RN Health Coach discussed copd action plan.   THN CM Short Term Goal #2   Pt will verbalize following low salt diet within the next 30 days  THN CM Short Term Goal #2 Start Date  04/07/17  Interventions for Short Term Goal #2  [place] discussed diet. Discussed with the patient about watching his salt intake.     Plan: RN Health Coach will contact patient in the month of March and patient agrees to next outreach.  [name] RN, BSN, [place] Direct Dial:  [phone] Fax: [phone]

## 2017-04-12 DIAGNOSIS — E559 Vitamin D deficiency, unspecified: Secondary | ICD-10-CM | POA: Diagnosis not present

## 2017-04-12 DIAGNOSIS — D509 Iron deficiency anemia, unspecified: Secondary | ICD-10-CM | POA: Diagnosis not present

## 2017-04-12 DIAGNOSIS — R809 Proteinuria, unspecified: Secondary | ICD-10-CM | POA: Diagnosis not present

## 2017-04-12 DIAGNOSIS — Z79899 Other long term (current) drug therapy: Secondary | ICD-10-CM | POA: Diagnosis not present

## 2017-04-12 DIAGNOSIS — I10 Essential (primary) hypertension: Secondary | ICD-10-CM | POA: Diagnosis not present

## 2017-04-12 DIAGNOSIS — N183 Chronic kidney disease, stage 3 (moderate): Secondary | ICD-10-CM | POA: Diagnosis not present

## 2017-04-19 DIAGNOSIS — R801 Persistent proteinuria, unspecified: Secondary | ICD-10-CM | POA: Diagnosis not present

## 2017-04-19 DIAGNOSIS — N183 Chronic kidney disease, stage 3 (moderate): Secondary | ICD-10-CM | POA: Diagnosis not present

## 2017-04-19 DIAGNOSIS — D649 Anemia, unspecified: Secondary | ICD-10-CM | POA: Diagnosis not present

## 2017-04-19 DIAGNOSIS — E1129 Type 2 diabetes mellitus with other diabetic kidney complication: Secondary | ICD-10-CM | POA: Diagnosis not present

## 2017-04-20 ENCOUNTER — Other Ambulatory Visit: Payer: Self-pay | Admitting: Licensed Clinical Social Worker

## 2017-04-20 NOTE — Patient Outreach (Signed)
Assessment:  CSW spoke via phone with Brad Mendoza . CSW verified Brad Mendoza identity. CSW received verbal permission from Brad Mendoza on 04/20/17 for CSW to speak with Brad Mendoza about Brad Mendoza needs and status. Brad Mendoza continues to receive telephonic support from [facility], [place].  CSW spoke with Brad Mendoza about Brad Mendoza care plan. CSW encouraged Brad Mendoza to continue to communicate with CSW in next 30 days to discuss financial resources for Brad Mendoza in the area. CSW informed Brad Mendoza of [facility] in [place], [place] at [place]. [phone].  CSW encouraged Brad Mendoza to call that agency as needed to discuss financial management of Brad Mendoza.  Brad Mendoza wrote down phone number of Neurosurgeon. CSW also talked with Brad Mendoza about [facility]ability [facility]ient [facility] and in home support with that agency. CSW gave Day phone number for that agency at [phone].  Brad Mendoza said he would call Aging, Disability and Transient agency to discuss in home care needs of Brad Mendoza. CSW thanked Brad Mendoza for phone call with CSW. CSW encouraged Brad Mendoza to call CSW at [phone] as needed to discuss social work needs of Brad Mendoza. Brad Mendoza was appreciative of phone call from Brad Mendoza on 04/20/17.   Plan:  Brad Mendoza to communicate with CSW in next 30 days to discuss financial resources for Brad Mendoza in the area.  CSW to collaborate with Brad Mendoza, Brad Mendoza, in monitoring needs of Brad Mendoza.  CSW to call Brad Mendoza in 4 weeks to assess Brad Mendoza needs.  [name].Kimya Mccahill MSW, LCSW Licensed Clinical Social Worker [facility] Care Management [phone]

## 2017-05-08 ENCOUNTER — Other Ambulatory Visit: Payer: Self-pay

## 2017-05-08 NOTE — Patient Outreach (Addendum)
[facility]) Care Management  05/08/2017  Brad Mendoza May 20, 1955 [id number]  1st attempt unsuccessful outreach to the patient for monthly assessment.  No answer  Unable to leave a voicemail. Voicemail has not been set up yet.  Plan: RN Health Coach will make an outreach attempt to the patient in one business day.  [name] RN, BSN, [place] Direct Dial:  [phone]  Fax: [phone]

## 2017-05-09 ENCOUNTER — Other Ambulatory Visit: Payer: Self-pay

## 2017-05-09 NOTE — Patient Outreach (Signed)
[facility] [facility]) Care Management  [place]  05/09/2017   Brad Mendoza 01/31/1956 [id number]  Subjective: Successful outreach call to the patient for monthly assessment.  HIPAA verified.  The patient is doing good.  He denies any pain or falls. The patient states that he was having some issues with his breathing about two weeks ago but he was not using his inhaler.  He has gotten it filled and no longer has any issues.  He was also having issues with swelling and he states his cardiologist changed one of his fluid pills to torsemide 20 mg.  He does not have any swelling now.  He states that he is adherent with his medications. The patient did express that he is becoming overwhelmed with his medication.  RN Health Coach will make a referral to pharmacy for medication management. He is aware of the salt intake in his diet.  He states that he is staying active by cleaning his yard to get ready for a garden.  The patient states that he has some questions for his doctor when he goes to his appointment April 1. 2019.  RN Health Coach advised the patient to write down his questions for his physician.  The patient verbalized understanding.   Encounter Medications:  Outpatient Encounter Medications as of 05/09/2017  Medication Sig Note  . albuterol (PROVENTIL HFA;VENTOLIN HFA) 108 (90 Base) MCG/ACT inhaler Inhale 2 puffs into the lungs every 6 (six) hours as needed for wheezing or shortness of breath. 02/29/2016: Uses PRN  . aspirin EC 81 MG tablet Take 81 mg by mouth daily.   . Capsaicin (ICY HOT ARTHRITIS THERAPY EX) Apply 1 application topically 2 (two) times daily as needed (back pain).    . carvedilol (COREG) 12.5 MG tablet TAKE ONE TABLET TWICE A DAY WITH FOOD   . fluticasone (FLONASE) 50 MCG/ACT nasal spray USE 2 SPRAYS IN EACH NOSTRIL DAILY   . hydrALAZINE (APRESOLINE) 50 MG tablet Take 1 tablet (50 mg total) by mouth 3 (three) times daily.   . isosorbide mononitrate (IMDUR) 60  MG 24 hr tablet Take 1.5 tablets (90 mg total) by mouth daily.   . metFORMIN (GLUCOPHAGE-XR) 500 MG 24 hr tablet Take 1 tablet (500 mg total) by mouth daily with breakfast. (Patient taking differently: Take 500 mg by mouth daily as needed (for blood sugars). )   . Multiple Vitamins-Minerals (CENTRUM SILVER ADULT 50+ PO) Take 1 tablet by mouth daily.   . nitroGLYCERIN (NITROSTAT) 0.4 MG SL tablet DISSOLVE 1 TAB UNDER TOUNGE FOR CHEST PAIN. MAY REPEAT EVERY 5 MINUTES FOR 3 DOSES. IF NO RELIEF CALL 911 OR GO TO ER   . Omega-3 Fatty Acids (FISH OIL) 1000 MG CPDR Take 1 capsule by mouth daily.   . pantoprazole (PROTONIX) 40 MG tablet TAKE ONE (1) TABLET EACH DAY   . RANEXA 500 MG 12 hr tablet TAKE ONE TABLET BY MOUTH TWICE DAILY   . rosuvastatin (CRESTOR) 10 MG tablet TAKE ONE (1) TABLET EACH DAY   . sacubitril-valsartan (ENTRESTO) 97-103 MG Take 1 tablet by mouth 2 (two) times daily.   [name] spironolactone (ALDACTONE) 25 MG tablet Take 0.5 tablets (12.5 mg total) by mouth daily.   [name] torsemide (DEMADEX) 20 MG tablet Take 20 mg by mouth 4 (four) times daily.   [name] 300 UNIT/ML SOPN Inject 15-40 Units into the skin See admin instructions. Inject 40 units in the morning and 15 units at night 12/14/2016: The patient states  that he takes 40 units in the am and 15 units in the pm  . ULTICARE MINI PEN NEEDLES 31G X 6 MM MISC 4 TIMES DAILY   . furosemide (LASIX) 20 MG tablet TAKE 3 TABLETS IN THE MORNING AND TAKE 2 TABLETS IN THE EVENING AS DIRECTED (Patient not taking: Reported on 05/09/2017)   . terbinafine (LAMISIL) 1 % cream Apply 1 application topically 2 (two) times daily. Apply to both feet and between toes (Patient not taking: Reported on 05/09/2017)    No facility-administered encounter medications on file as of 05/09/2017.     Functional Status:  In your present state of health, do you have any difficulty performing the following activities: 12/14/2016 09/26/2016  Hearing? N N  Vision? Y N   Difficulty concentrating or making decisions? N N  Walking or climbing stairs? N N  Dressing or bathing? Y N  Doing errands, shopping? Y N  Preparing Food and eating ? N N  Using the Toilet? N N  In the past six months, have you accidently leaked urine? N N  Do you have problems with loss of bowel control? N N  Managing your Medications? N N  Comment - -  Managing your Finances? N N  Housekeeping or managing your Housekeeping? N N  Some recent data might be hidden    Fall/Depression Screening: Fall Risk  05/09/2017 04/07/2017 03/20/2017  Falls in the past year? No No No  Risk for fall due to : - - -   PHQ 2/9 Scores 12/14/2016 12/02/2016 09/26/2016 09/14/2016 09/01/2016 08/22/2016 07/07/2016  PHQ - 2 Score 1 0 0 0 0 0 0    Assessment: Patient will benefit from health coach outreach for disease management and support.   THN CM Care Plan Problem One     Most Recent Value  THN Long Term Goal   In 90 days the patient will verbalize he has not been seen in the hospital  [place] Term Goal Start Date  05/09/17  Interventions for Problem One [place] talked with the patient about condition management  THN CM Short Term Goal #1   In 30 days the patient will verbalize that he has written questions down and presented them to his physician  [facility] CM Short Term Goal #1 Start Date  05/09/17  Interventions for Short Term Goal #1  [facility] discussed with the patient about his health concerns that he wishes to talk with his physician about.  THN CM Short Term Goal #2   In 30 days the patient will verbalize that he has spoken with pharmacy about his medications.  THN CM Short Term Goal #2 Start Date  05/09/17  Interventions for Short Term Goal #2  [facility] talked to the patient about medication issues.  A refferal was sent to pharmacy.     Plan: RN Health Coach will contact patient in the month of April and patient agrees to next outreach.            RN Health Coach will make a referral  to Pharmacy for medication management.  [name] RN, BSN, [place] Direct Dial:  [phone]  Fax: [phone]

## 2017-05-12 ENCOUNTER — Telehealth: Payer: Self-pay | Admitting: Pharmacist

## 2017-05-12 NOTE — Patient Outreach (Signed)
**Note Brad-Identified via Obfuscation** [facility]) Care Management  05/12/2017  Brad Mendoza Jan 27, 1956 [id number]   62 y.o. year old male referred to [place] for Medication Management and Medication Adherence (Pharmacy telephone outreach)  PMH s/f: nonischemic cardiomyopathy, CAD, combined chronic systolic and diastolic heart failure with h/o exacerbation, HTN, anginal pain, seasonal allergies, type 2 diabetes with neuropathy, tobacco dependence, HLD, polysubstance abuse, obesity   Patient confirms identity with HIPAA-identifiers x2 and gives verbal consent to speak over the phone about medications.   Patient states that he has many medications with several changes and requests home visit to sort through them and get organized. States interest in pill packaging. Reports most expensive medication is ~$8.50 per month and denies need for help paying for medications usually. States he sometimes runs out of medications early.   Agrees to home visit on 3/27 at [place], Pharm.D., BCPS PGY2 Ambulatory Care Pharmacy Resident Phone: [phone]

## 2017-05-15 DIAGNOSIS — I10 Essential (primary) hypertension: Secondary | ICD-10-CM | POA: Diagnosis not present

## 2017-05-15 DIAGNOSIS — Z79899 Other long term (current) drug therapy: Secondary | ICD-10-CM | POA: Diagnosis not present

## 2017-05-15 DIAGNOSIS — R809 Proteinuria, unspecified: Secondary | ICD-10-CM | POA: Diagnosis not present

## 2017-05-15 DIAGNOSIS — D509 Iron deficiency anemia, unspecified: Secondary | ICD-10-CM | POA: Diagnosis not present

## 2017-05-15 DIAGNOSIS — N183 Chronic kidney disease, stage 3 (moderate): Secondary | ICD-10-CM | POA: Diagnosis not present

## 2017-05-17 ENCOUNTER — Other Ambulatory Visit: Payer: Self-pay | Admitting: Pharmacist

## 2017-05-17 NOTE — Patient Outreach (Signed)
[facility]) Care Management  [place]   05/18/2017  Brad Mendoza August 23, 1955 [id number]   62 y.o. year old male referred to [place] for Medication Management (Pharmacy home visit)   PMH s/f: nonischemic cardiomyopathy, CAD, combined chronic systolic and diastolic heart failure with h/o exacerbation, HTN, anginal pain, seasonal allergies, type 2 diabetes with neuropathy, tobacco dependence, HLD, polysubstance abuse, obesity  Patient with [place] advantage plan.   SUBJECTIVE:   Medication Adherence: Pt reports he takes torsemide 80 mg daily and sometimes 40 mg BID (~2 hrs apart). Also reports PRN use of metformin. Denies GI upset with metformin at 500 mg dose. Reports h/o intolerance to higher metformin doses. Reports he does not miss medications whatsoever otherwise.  Not using pill box and not interested in changing to a pill box as he likes to know which pill he is taking each time. Accepts pill box and states he will consider using in future. Denies taking pantoprazole daily and denies GERD sx.   Medication Assistance:  Medications are on the expensive side per patient, however 3 month supply is affordable.  Called [facility]" where they report that patient has LIS/Extra help approval and most expensive medications are $8.50.  [employer] plan with patient, no OTC benefit with his insurance plan.   Medication Management:  Not using albuterol inhaler regularly (<1x/week), denies shortness of breath. Reports continued tobacco use -[place] 1 can/day - wants to quit, trying to quit cold [place].  Denies dizziness/CP/falls/syncope. Reports he is weighing daily. Today weighed 188.8 lbs Tests blood sugar twice daily. Does walk daily and likes to work in yard. Denies hypoglycemia and states he would correct hypoglycemia with suckers.   Patient reports he can't make an appointment with Dr. Evette Doffing again until he pays his bill. States  he has been intending to call services suggested by [facility] however he has lost the numbers.   OBJECTIVE: Blood glucose today = 230 lbs (ate honey bun this morning) Weight today = 188.8 lbs Blood glucose log review:  7day average = 249 14day average = 252 30day average = 240 No hypoglycemia, CBGs range from 150-280, excursions to 300s and 400s  Encounter Medications: Outpatient Encounter Medications as of 05/17/2017  Medication Sig Note  . albuterol (PROVENTIL HFA;VENTOLIN HFA) 108 (90 Base) MCG/ACT inhaler Inhale 2 puffs into the lungs every 6 (six) hours as needed for wheezing or shortness of breath. 05/17/2017: Takes <1x/week   . aspirin EC 81 MG tablet Take 81 mg by mouth daily.   . Capsaicin (ICY HOT ARTHRITIS THERAPY EX) Apply 1 application topically 2 (two) times daily as needed (back pain).    . carvedilol (COREG) 12.5 MG tablet TAKE ONE TABLET TWICE A DAY WITH FOOD   . Ergocalciferol (VITAMIN D2) 400 units TABS Take 1 tablet by mouth daily.   . fluticasone (FLONASE) 50 MCG/ACT nasal spray USE 2 SPRAYS IN EACH NOSTRIL DAILY   . hydrALAZINE (APRESOLINE) 50 MG tablet Take 1 tablet (50 mg total) by mouth 3 (three) times daily.   . isosorbide mononitrate (IMDUR) 60 MG 24 hr tablet Take 1.5 tablets (90 mg total) by mouth daily.   . metFORMIN (GLUCOPHAGE-XR) 500 MG 24 hr tablet Take 1 tablet (500 mg total) by mouth daily with breakfast. (Patient taking differently: Take 500 mg by mouth daily as needed (for blood sugars). )   . Multiple Vitamins-Minerals (CENTRUM SILVER ADULT 50+ PO) Take 1 tablet by mouth daily.   [name]  nitroGLYCERIN (NITROSTAT) 0.4 MG SL tablet DISSOLVE 1 TAB UNDER TOUNGE FOR CHEST PAIN. MAY REPEAT EVERY 5 MINUTES FOR 3 DOSES. IF NO RELIEF CALL 911 OR GO TO ER   . Omega-3 Fatty Acids (FISH OIL) 1000 MG CPDR Take 1 capsule by mouth daily.   [name] RANEXA 500 MG 12 hr tablet TAKE ONE TABLET BY MOUTH TWICE DAILY   . rosuvastatin (CRESTOR) 10 MG tablet TAKE ONE (1) TABLET EACH  DAY   . sacubitril-valsartan (ENTRESTO) 97-103 MG Take 1 tablet by mouth 2 (two) times daily.   [name] spironolactone (ALDACTONE) 25 MG tablet Take 0.5 tablets (12.5 mg total) by mouth daily.   [name] terbinafine (LAMISIL) 1 % cream Apply 1 application topically 2 (two) times daily. Apply to both feet and between toes   . torsemide (DEMADEX) 20 MG tablet Take 80 mg by mouth daily.    [name] 300 UNIT/ML SOPN Inject 15-40 Units into the skin See admin instructions. Inject 40 units in the morning and 15 units at night   . ULTICARE MINI PEN NEEDLES 31G X 6 MM MISC 4 TIMES DAILY   . pantoprazole (PROTONIX) 40 MG tablet TAKE ONE (1) TABLET EACH DAY (Patient not taking: Reported on 05/17/2017)   . [DISCONTINUED] furosemide (LASIX) 20 MG tablet TAKE 3 TABLETS IN THE MORNING AND TAKE 2 TABLETS IN THE EVENING AS DIRECTED (Patient not taking: Reported on 05/09/2017)    No facility-administered encounter medications on file as of 05/17/2017.     Functional Status: In your present state of health, do you have any difficulty performing the following activities: 12/14/2016 09/26/2016  Hearing? N N  Vision? Y N  Difficulty concentrating or making decisions? N N  Walking or climbing stairs? N N  Dressing or bathing? Y N  Doing errands, shopping? Y N  Preparing Food and eating ? N N  Using the Toilet? N N  In the past six months, have you accidently leaked urine? N N  Do you have problems with loss of bowel control? N N  Managing your Medications? N N  Comment - -  Managing your Finances? N N  Housekeeping or managing your Housekeeping? N N  Some recent data might be hidden    Fall/Depression Screening: Fall Risk  05/09/2017 04/07/2017 03/20/2017  Falls in the past year? No No No  Risk for fall due to : - - -   PHQ 2/9 Scores 12/14/2016 12/02/2016 09/26/2016 09/14/2016 09/01/2016 08/22/2016 07/07/2016  PHQ - 2 Score 1 0 0 0 0 0 0     Drugs sorted by  system:  Neurologic/Psychologic:  Cardiovascular:aspirin, carvedilol, hydralazine, isosorbide mononitrate, metformin, entresto, spironolactone, torsemide, ranexa, fish oil, rosuvastatin, nitroglycerin  Pulmonary/Allergy:albuterol, fluticasone nasal spray  Gastrointestinal:pantoprazole (not using)  Endocrine:metformin, Toujeo  Topical:capsaicin, terbinafine   Vitamins/Minerals:ergocalciferol, multivitamin, fish oil  Duplications in therapy: none Gaps in therapy: none Medications to avoid in the elderly: none of concern, not using pantoprazole Drug interactions: none clinically significant Other issues noted: nonadherence to metformin  PLAN:  #Medication Management   -Counseled on regular use of metformin 500 mg daily  -Reviewed pathophysiology of diabetes and effect of carbohydrates on blood sugar -Counseled patient on hypoglycemia signs/symptoms and management -Gave information on 1-800-QUITNOW for tobacco cessation -Counseled patient on sodium restriction and daily weights -Encouraged patient to follow up with Dr. Haroldine Laws as scheduled on 05/31/16, encouraged patient to make appointment with primary care provider - wrote down numbers for transportation and housing repair resources provided by [facility]  Social Worker for patient -Provided 2019 calendar for patient   #Medication Adherence  -Counseled on adherence to metformin daily, torsemide 4 tabs once daily -Provided pill box to patient and offered to teach him how to fill it. Patient declines for now but accepts pill box -Made medication list for patient and put inside provided calendar  #Medication Assistance -No further need identified at this time  Will route note to Dr. Haroldine Laws and Dr. Evette Doffing. No further pharmacy needs identified. Will sign off.   Carlean Jews, Pharm.D., BCPS PGY2 Ambulatory Care Pharmacy Resident Phone: [phone]

## 2017-05-19 ENCOUNTER — Other Ambulatory Visit: Payer: Self-pay | Admitting: Licensed Clinical Social Worker

## 2017-05-19 NOTE — Patient Outreach (Signed)
Assessment:  CSW spoke via phone with Brad Mendoza. CSW verified Brad Mendoza identity. CSW received verbal permission from Brad Mendoza on 05/19/17 for CSW to speak with Brad Mendoza about Brad Mendoza needs.CSW spoke with Brad Mendoza about Brad Mendoza care plan. CSW encouraged Brad Mendoza to continue  to communicate with CSW in next 30 days to discuss financial needs of Brad Mendoza and to discuss financial resources for Brad Mendoza in the area. CSW gave Brad Mendoza again the number for [employer] agency in [place], [place]. CSW gave Brad Mendoza again the number for [employer].  CSW talked with Brad Mendoza about services at [place]endo[place], Disability and Transient Services in [place], [place]. CSW encouraged Brad Mendoza to call customer services number on his [facility] insurance card to talk with representative about transport benefits of Brad Mendoza with Brad Mendo[place].  Brad Mendoza said he had been able to afford his prescribed medications.  He said he still had to pay a back bill owed to his primary care doctor. He said he planned for his son to help Brad Mendoza with some roof repairs needed for Brad Mendoza. CSW thanked [name] for phone call with CSW. CSW encouraged [name] to call CSW as needed to discuss social work needs of Brad Mendoza.    Plan:  Brad Mendoza to continue to communicate with CSW in next 30 days to discuss financial needs of Brad Mendoza and to discuss financial resources for Brad Mendoza in the area.   CSW to collaborate with Brad Mendoza, Brad Mendoza in monitoring needs of Brad Mendoza.  CSW to call Brad Mendoza in 4 weeks to assess Brad Mendoza needs.  Brad Mendoza.Julie-Anne Torain MSW, LCSW Licensed Clinical Social Worker [facility] Care Management [phone]

## 2017-05-23 DIAGNOSIS — I429 Cardiomyopathy, unspecified: Secondary | ICD-10-CM | POA: Insufficient documentation

## 2017-05-23 DIAGNOSIS — K219 Gastro-esophageal reflux disease without esophagitis: Secondary | ICD-10-CM | POA: Insufficient documentation

## 2017-05-23 DIAGNOSIS — B351 Tinea unguium: Secondary | ICD-10-CM | POA: Insufficient documentation

## 2017-05-25 LAB — HM DIABETES EYE EXAM

## 2017-05-30 ENCOUNTER — Encounter: Payer: Self-pay | Admitting: Pediatrics

## 2017-05-30 DIAGNOSIS — D649 Anemia, unspecified: Secondary | ICD-10-CM | POA: Diagnosis not present

## 2017-05-30 DIAGNOSIS — R809 Proteinuria, unspecified: Secondary | ICD-10-CM | POA: Diagnosis not present

## 2017-05-30 DIAGNOSIS — E559 Vitamin D deficiency, unspecified: Secondary | ICD-10-CM | POA: Diagnosis not present

## 2017-05-30 DIAGNOSIS — N183 Chronic kidney disease, stage 3 (moderate): Secondary | ICD-10-CM | POA: Diagnosis not present

## 2017-05-30 DIAGNOSIS — R601 Generalized edema: Secondary | ICD-10-CM | POA: Diagnosis not present

## 2017-05-30 DIAGNOSIS — E1121 Type 2 diabetes mellitus with diabetic nephropathy: Secondary | ICD-10-CM | POA: Diagnosis not present

## 2017-05-30 DIAGNOSIS — H3500 Unspecified background retinopathy: Secondary | ICD-10-CM | POA: Insufficient documentation

## 2017-05-30 NOTE — Progress Notes (Signed)
[facility] Note  05/31/2017     CHIEF COMPLAINT Patient presents for Retina Evaluation and Diabetic Eye Exam   HISTORY OF PRESENT ILLNESS: Brad Mendoza is a 62 y.o. male who presents to the clinic today for:   HPI    Retina Evaluation    In both eyes.  This started 2 months ago.  Associated Symptoms Photophobia and Pain.  Negative for Blind Spot, Glare, Shoulder/Hip pain, Fatigue, Jaw Claudication, Distortion, Floaters, Redness, Scalp Tenderness, Weight Loss, Fever, Trauma and Flashes.  Context:  distance vision, mid-range vision and near vision.  Treatments tried include no treatments.  I, the attending physician,  performed the HPI with the patient and updated documentation appropriately.          Diabetic Eye Exam    Vision fluctuates with blood sugars.  Associated Symptoms Negative for Blind Spot, Glare, Shoulder/Hip pain, Fatigue, Jaw Claudication, Photophobia, Distortion, Floaters, Redness, Scalp Tenderness, Weight Loss, Fever, Trauma, Pain and Flashes.  Diabetes characteristics include Type 2.  This started 9 years ago.  Blood sugar level fluctuates.  Last Blood Glucose 229.  Last A1C 10.  I, the attending physician,  performed the HPI with the patient and updated documentation appropriately.          Comments    Referral of Dr. [name] for retina evaluation. Patient states two months ago he noticed his left eye begin blood shot and was a little achy and he had occasional pressure left temporal area. He states his left eye continues to be watery and achy. Pt reports he has light sensitivity mainly in the morning. Pt is DM2  Dx 9 yrs ago, his BS fluctuates, he is on metformin and Toujeo SQ, Bs 229 yesterday(05/29/17) last A1C 10 three months ago.Denies Vit's/gtt's          Last edited by [name], MD on 05/31/2017 12:28 PM. (History)    Pt states Dr saw Dr. Marin Comment and she wanted him to see retina specialist; Pt states he is diabetic and reports CBG  has been elevated for 2-3 months, states CBG has been running in 200-300s;   Referring physician: Harlen Labs, MD [address] [place], [place] [address]  HISTORICAL INFORMATION:   Selected notes from the MEDICAL RECORD NUMBER Referred by Dr. [name] for concern of CSME OS;  LEE- 04.04.19 ([name]) [BCVA OD: 20/20 OS: 20/40] Ocular Hx-  PMH- DM, CAD, CHF, HTN, hyperlipidemia    CURRENT MEDICATIONS: No current outpatient medications on file. (Ophthalmic Drugs)   No current facility-administered medications for this visit.  (Ophthalmic Drugs)   Current Outpatient Medications (Other)  Medication Sig  . albuterol (PROVENTIL HFA;VENTOLIN HFA) 108 (90 Base) MCG/ACT inhaler Inhale 2 puffs into the lungs every 6 (six) hours as needed for wheezing or shortness of breath.  [name] aspirin EC 81 MG tablet Take 81 mg by mouth daily.  . Capsaicin (ICY HOT ARTHRITIS THERAPY EX) Apply 1 application topically 2 (two) times daily as needed (back pain).   . carvedilol (COREG) 12.5 MG tablet TAKE ONE TABLET TWICE A DAY WITH FOOD  . Ergocalciferol (VITAMIN D2) 400 units TABS Take 1 tablet by mouth daily.  . fluticasone (FLONASE) 50 MCG/ACT nasal spray USE 2 SPRAYS IN EACH NOSTRIL DAILY  . hydrALAZINE (APRESOLINE) 50 MG tablet Take 1 tablet (50 mg total) by mouth 3 (three) times daily.  . isosorbide mononitrate (IMDUR) 60 MG 24 hr tablet Take 1.5 tablets (  90 mg total) by mouth daily.  . metFORMIN (GLUCOPHAGE-XR) 500 MG 24 hr tablet Take 1 tablet (500 mg total) by mouth daily with breakfast. (Patient taking differently: Take 500 mg by mouth daily as needed (for blood sugars). )  . Multiple Vitamins-Minerals (CENTRUM SILVER ADULT 50+ PO) Take 1 tablet by mouth daily.  . nitroGLYCERIN (NITROSTAT) 0.4 MG SL tablet DISSOLVE 1 TAB UNDER TOUNGE FOR CHEST PAIN. MAY REPEAT EVERY 5 MINUTES FOR 3 DOSES. IF NO RELIEF CALL 911 OR GO TO ER  . Omega-3 Fatty Acids (FISH OIL) 1000 MG CPDR Take 1 capsule by mouth daily.  . pantoprazole  (PROTONIX) 40 MG tablet TAKE ONE (1) TABLET EACH DAY  . RANEXA 500 MG 12 hr tablet TAKE ONE TABLET BY MOUTH TWICE DAILY  . rosuvastatin (CRESTOR) 10 MG tablet TAKE ONE (1) TABLET EACH DAY  . sacubitril-valsartan (ENTRESTO) 97-103 MG Take 1 tablet by mouth 2 (two) times daily.  [name] spironolactone (ALDACTONE) 25 MG tablet Take 0.5 tablets (12.5 mg total) by mouth daily.  [name] terbinafine (LAMISIL) 1 % cream Apply 1 application topically 2 (two) times daily. Apply to both feet and between toes  . torsemide (DEMADEX) 20 MG tablet Take 80 mg by mouth daily.   [name] 300 UNIT/ML SOPN Inject 15-40 Units into the skin See admin instructions. Inject 40 units in the morning and 15 units at night  . ULTICARE MINI PEN NEEDLES 31G X 6 MM MISC 4 TIMES DAILY   No current facility-administered medications for this visit.  (Other)      REVIEW OF SYSTEMS: ROS    Positive for: Endocrine, Cardiovascular, Eyes   Negative for: Constitutional, Gastrointestinal, Neurological, Skin, Genitourinary, Musculoskeletal, HENT, Respiratory, Psychiatric, Allergic/Imm, Heme/Lymph   Last edited by [name], [name] on 08/18/3149  7:61 AM. (History)       ALLERGIES Allergies  Allergen Reactions  . Sulfa Antibiotics Shortness Of Breath  . Penicillins Rash    Has patient had a PCN reaction causing immediate rash, facial/tongue/throat swelling, SOB or lightheadedness with hypotension: No Has patient had a PCN reaction causing severe rash involving mucus membranes or skin necrosis: No Has patient had a PCN reaction that required hospitalization No Has patient had a PCN reaction occurring within the last 10 years: No If all of the above answers are "NO", then may proceed with Cephalosporin use.     PAST MEDICAL HISTORY Past Medical History:  Diagnosis Date  . Acid reflux   . Back pain    f4 and f5  . CAD (coronary artery disease)   . CHF (congestive heart failure) ([place])    last echo was in March 2016  .  Diabetes mellitus 2005  . Diverticulosis   . H/O chest pain 2005   ECHO  . HTN (hypertension)   . Hyperlipidemia   . Nonischemic cardiomyopathy ([place])   . Onychomycosis   . Polysubstance abuse [facility])    Past Surgical History:  Procedure Laterality Date  . APPENDECTOMY    . CARDIAC CATHETERIZATION  2005   4 frech cath  . COLONOSCOPY N/A 08/01/2013   Procedure: COLONOSCOPY;  Surgeon: Rogene Houston, MD;  [facility];  Service: Endoscopy;  Laterality: N/A;  rescheduled to [place] notified pt  . ESOPHAGOGASTRODUODENOSCOPY N/A 04/23/2015   Procedure: ESOPHAGOGASTRODUODENOSCOPY (EGD);  Surgeon: Rogene Houston, MD;  Location: [facility];  Service: Endoscopy;  Laterality: N/A;  1:25  . RIGHT/LEFT HEART CATH AND CORONARY ANGIOGRAPHY N/A 06/03/2016  Procedure: Right/Left Heart Cath and Coronary Angiography;  Surgeon: Belva Crome, MD;  Location: [place] [facility];  Service: Cardiovascular;  Laterality: N/A;    FAMILY HISTORY Family History  Problem Relation Age of Onset  . Hypertension Father   . Diabetes Father   . Cancer Mother   . Alcohol abuse Brother   . Colon cancer Neg Hx     SOCIAL HISTORY Social History   Tobacco Use  . Smoking status: Never Smoker  . Smokeless tobacco: Current User    Types: Snuff  . Tobacco comment: dips snuff about 6 cans/week for 3 years  Substance Use Topics  . Alcohol use: Yes    Alcohol/week: 0.6 oz    Types: 1 Cans of beer per week    Comment: occ - has cut down, now once every 2-3 months  . Drug use: Yes    Types: Marijuana    Comment: occasional use - smoking less         OPHTHALMIC EXAM:  Base Eye Exam    Visual Acuity (Snellen - Linear)      Right Left   Dist cc 20/25 20/40 -2   Dist ph cc 20/25 20/40 -1   Correction:  Glasses       Tonometry (Tonopen, 8:41 AM)      Right Left   Pressure 10 10       Pupils      Dark Light Shape React APD   Right 4 2 Round Slow None   Left 4 2 Round Slow None        Visual [place] (Counting fingers)      Left Right    Full Full       Extraocular Movement      Right Left    Full, Ortho Full, Ortho       Neuro/Psych    Oriented x3:  Yes   Mood/Affect:  Normal       Dilation    Both eyes:  1.0% Mydriacyl, 2.5% Phenylephrine @ 8:41 AM        Slit Lamp and Fundus Exam    Slit Lamp Exam      Right Left   Lids/Lashes Dermatochalasis - upper lid Dermatochalasis - upper lid   Conjunctiva/Sclera Melanosis Melanosis   Cornea Arcus, trace inferior and central Punctate epithelial erosions Arcus, trace inferior and central Punctate epithelial erosions, round k scar inferonasal para-central   Anterior Chamber Deep and quiet Deep and quiet   Iris Round and dilated, No NVI Round and dilated, No NVI   Lens 2+ Nuclear sclerosis, 2+ Cortical cataract, Vacuoles 2+ Nuclear sclerosis, 2+ Cortical cataract, Vacuoles   Vitreous Vitreous syneresis Vitreous syneresis       Fundus Exam      Right Left   Disc Normal Normal   C/D Ratio 0.4 0.4   Macula Good foveal reflex, scattered Microaneurysms, DBH, puncate exudates Blunted foveal reflex, +edema, scattered MAs, scattered DBH, scattered exudates   Vessels Mild Vascular attenuation Mild Vascular attenuation   Periphery Attached, scattered MAs, few CWS posteriorly Attached, scattered DBH, scattered CWS        Refraction    Wearing Rx      Sphere Cylinder Axis Add   Right -0.25 +1.00 019 +2.50   Left -0.25 +1.00 153 +2.50       Manifest Refraction      Sphere Cylinder Axis Dist VA   Right -0.75 +1.00 019 20/25   Left -1.25 +1.00  153 20/25          IMAGING AND PROCEDURES  Imaging and Procedures for 05/31/17  OCT, Retina - OU - Both Eyes       Right Eye Quality was good. Central Foveal Thickness: 270. Progression has no prior data. Findings include normal foveal contour, no SRF (Cystic changes temporally).   Left Eye Quality was good. Central Foveal Thickness: 275. Progression has no prior  data. Findings include normal foveal contour, intraretinal fluid, no SRF.   Notes *Images captured and stored on drive  Diagnosis / Impression:  DME OU, OS>OD  Clinical management:  See below  Abbreviations: NFP - Normal foveal profile. CME - cystoid macular edema. PED - pigment epithelial detachment. IRF - intraretinal fluid. SRF - subretinal fluid. EZ - ellipsoid zone. ERM - epiretinal membrane. ORA - outer retinal atrophy. ORT - outer retinal tubulation. SRHM - subretinal hyper-reflective material         Fluorescein Angiography Optos (Transit OS)       Right Eye Progression has no prior data. Early phase findings include microaneurysm, vascular perfusion defect. Mid/Late phase findings include microaneurysm, leakage, vascular perfusion defect.   Left Eye Progression has no prior data. Early phase findings include microaneurysm, vascular perfusion defect. Mid/Late phase findings include microaneurysm, vascular perfusion defect, leakage.   Notes Impression:  OD: late leaking MAs and capillary non-perfusion; No NV -- NPDR OS: late leaking MAs, capillary non-perfusion; +IRMA; No NV -- NPDR                ASSESSMENT/PLAN:    ICD-10-CM   1. Moderate nonproliferative diabetic retinopathy of both eyes with macular edema associated with type 2 diabetes mellitus (HCC) U31.4970 Fluorescein Angiography Optos (Transit OS)  2. Retinal edema H35.81 OCT, Retina - OU - Both Eyes  3. Combined forms of age-related cataract of both eyes H25.813     1, 2. Moderate to severe non-proliferative diabetic retinopathy, OU - The incidence, risk factors for progression, natural history and treatment options for diabetic retinopathy  were discussed with patient.   - The need for close monitoring of blood glucose, blood pressure, and serum lipids, avoiding cigarette or any type of tobacco, and the need for long term follow up was also discussed with patient. - exam shows scattered IRH,  exudates, and cotton wool spots, OS > OD, no NV - FA 4.10.19 shows no NV OU - OCT shows diabetic macular edema, OU (OS > OD) The natural history, pathology, and characteristics of diabetic macular edema discussed with patient.  A generalized discussion of the major clinical trials concerning treatment of diabetic macular edema (ETDRS, DCT, SCORE, RISE / RIDE, and ongoing DRCR net studies) was completed.  This discussion included mention of the various approaches to treating diabetic macular edema (observation, laser photocoagulation, anti-VEGF injections with lucentis / Avastin / Eylea, steroid injections with Kenalog / Ozurdex, and intraocular surgery with vitrectomy).  The goal hemoglobin A1C of 6-7 was discussed, as well as importance of smoking cessation and hypertension control.  Need for ongoing treatment and monitoring were specifically discussed with reference to chronic nature of diabetic macular edema. - pt wishes to observe for now -- reasonable as BCVA is still 20/25 OS - discussed likelihood of treatment at next visit if DME worsens or fail to improve - f/u in 6-8 wks, sooner prn  3. Combined form age-related cataract OU-  - The symptoms of cataract, surgical options, and treatments and risks were discussed with patient. - discussed diagnosis  and progression - not yet visually significant - monitor for now   Ophthalmic Meds Ordered this visit:  No orders of the defined types were placed in this encounter.      Return in about 7 weeks (around 07/19/2017) for F/U NPDR OU, Dilated exam, OCT.  There are no Patient Instructions on file for this visit.   Explained the diagnoses, plan, and follow up with the patient and they expressed understanding.  Patient expressed understanding of the importance of proper follow up care.   This document serves as a record of services personally performed by Gardiner Sleeper, MD, PhD. It was created on their behalf by Catha Brow, [place], a  certified ophthalmic assistant. The creation of this record is the provider's dictation and/or activities during the visit.  Electronically signed by: Catha Brow, [place]  05/31/17 12:28 PM   Gardiner Sleeper, M.D., Ph.D. Diseases & Surgery of the Retina and [place] [facility] [place] 05/31/17  I have reviewed the above documentation for accuracy and completeness, and I agree with the above. Gardiner Sleeper, M.D., Ph.D. 05/31/17 12:32 PM     Abbreviations: M myopia (nearsighted); A astigmatism; H hyperopia (farsighted); P presbyopia; Mrx spectacle prescription;  CTL contact lenses; OD right eye; OS left eye; OU both eyes  XT exotropia; ET esotropia; PEK punctate epithelial keratitis; PEE punctate epithelial erosions; DES dry eye syndrome; MGD meibomian gland dysfunction; ATs artificial tears; PFAT's preservative free artificial tears; [place] nuclear sclerotic cataract; PSC posterior subcapsular cataract; ERM epi-retinal membrane; PVD posterior vitreous detachment; RD retinal detachment; DM diabetes mellitus; DR diabetic retinopathy; NPDR non-proliferative diabetic retinopathy; PDR proliferative diabetic retinopathy; CSME clinically significant macular edema; DME diabetic macular edema; dbh dot blot hemorrhages; CWS cotton wool spot; POAG primary open angle glaucoma; C/D cup-to-disc ratio; HVF humphrey visual field; GVF goldmann visual field; OCT optical coherence tomography; IOP intraocular pressure; BRVO Branch retinal vein occlusion; CRVO central retinal vein occlusion; CRAO central retinal artery occlusion; BRAO branch retinal artery occlusion; RT retinal tear; SB scleral buckle; PPV pars plana vitrectomy; VH Vitreous hemorrhage; PRP panretinal laser photocoagulation; IVK intravitreal kenalog; VMT vitreomacular traction; MH Macular hole;  NVD neovascularization of the disc; NVE neovascularization elsewhere; AREDS age related eye disease study; ARMD age related macular degeneration;  POAG primary open angle glaucoma; EBMD epithelial/anterior basement membrane dystrophy; ACIOL anterior chamber intraocular lens; IOL intraocular lens; PCIOL posterior chamber intraocular lens; Phaco/IOL phacoemulsification with intraocular lens placement; [place] photorefractive keratectomy; LASIK laser assisted in situ keratomileusis; HTN hypertension; DM diabetes mellitus; COPD chronic obstructive pulmonary disease

## 2017-05-31 ENCOUNTER — Ambulatory Visit (HOSPITAL_COMMUNITY)
Admission: RE | Admit: 2017-05-31 | Discharge: 2017-05-31 | Disposition: A | Discharge status: Home / Self Care | Payer: Medicare Other | Source: Ambulatory Visit | Attending: Pediatrics | Admitting: Pediatrics

## 2017-05-31 ENCOUNTER — Encounter (INDEPENDENT_AMBULATORY_CARE_PROVIDER_SITE_OTHER): Payer: Self-pay | Admitting: Ophthalmology

## 2017-05-31 ENCOUNTER — Encounter (HOSPITAL_COMMUNITY): Payer: Self-pay | Admitting: Internal Medicine

## 2017-05-31 ENCOUNTER — Telehealth (HOSPITAL_COMMUNITY): Payer: Self-pay

## 2017-05-31 ENCOUNTER — Ambulatory Visit (INDEPENDENT_AMBULATORY_CARE_PROVIDER_SITE_OTHER): Payer: Medicare Other | Admitting: Ophthalmology

## 2017-05-31 ENCOUNTER — Other Ambulatory Visit: Payer: Self-pay

## 2017-05-31 ENCOUNTER — Ambulatory Visit (HOSPITAL_BASED_OUTPATIENT_CLINIC_OR_DEPARTMENT_OTHER)
Admission: RE | Admit: 2017-05-31 | Discharge: 2017-05-31 | Disposition: A | Discharge status: Home / Self Care | Payer: Medicare Other | Source: Ambulatory Visit | Attending: Internal Medicine | Admitting: Internal Medicine

## 2017-05-31 VITALS — BP 114/74 | HR 70 | Wt 211.5 lb

## 2017-05-31 DIAGNOSIS — Z7951 Long term (current) use of inhaled steroids: Secondary | ICD-10-CM | POA: Insufficient documentation

## 2017-05-31 DIAGNOSIS — H3581 Retinal edema: Secondary | ICD-10-CM

## 2017-05-31 DIAGNOSIS — E1122 Type 2 diabetes mellitus with diabetic chronic kidney disease: Secondary | ICD-10-CM | POA: Diagnosis not present

## 2017-05-31 DIAGNOSIS — Z79899 Other long term (current) drug therapy: Secondary | ICD-10-CM | POA: Insufficient documentation

## 2017-05-31 DIAGNOSIS — F17200 Nicotine dependence, unspecified, uncomplicated: Secondary | ICD-10-CM

## 2017-05-31 DIAGNOSIS — E119 Type 2 diabetes mellitus without complications: Secondary | ICD-10-CM | POA: Diagnosis not present

## 2017-05-31 DIAGNOSIS — I130 Hypertensive heart and chronic kidney disease with heart failure and stage 1 through stage 4 chronic kidney disease, or unspecified chronic kidney disease: Secondary | ICD-10-CM | POA: Diagnosis not present

## 2017-05-31 DIAGNOSIS — N183 Chronic kidney disease, stage 3 unspecified: Secondary | ICD-10-CM

## 2017-05-31 DIAGNOSIS — K219 Gastro-esophageal reflux disease without esophagitis: Secondary | ICD-10-CM | POA: Insufficient documentation

## 2017-05-31 DIAGNOSIS — I5042 Chronic combined systolic (congestive) and diastolic (congestive) heart failure: Secondary | ICD-10-CM | POA: Diagnosis not present

## 2017-05-31 DIAGNOSIS — E113313 Type 2 diabetes mellitus with moderate nonproliferative diabetic retinopathy with macular edema, bilateral: Secondary | ICD-10-CM

## 2017-05-31 DIAGNOSIS — I428 Other cardiomyopathies: Secondary | ICD-10-CM | POA: Diagnosis not present

## 2017-05-31 DIAGNOSIS — I5022 Chronic systolic (congestive) heart failure: Secondary | ICD-10-CM | POA: Diagnosis not present

## 2017-05-31 DIAGNOSIS — E785 Hyperlipidemia, unspecified: Secondary | ICD-10-CM | POA: Diagnosis not present

## 2017-05-31 DIAGNOSIS — I10 Essential (primary) hypertension: Secondary | ICD-10-CM | POA: Diagnosis not present

## 2017-05-31 DIAGNOSIS — Z7982 Long term (current) use of aspirin: Secondary | ICD-10-CM | POA: Diagnosis not present

## 2017-05-31 DIAGNOSIS — Z7984 Long term (current) use of oral hypoglycemic drugs: Secondary | ICD-10-CM | POA: Insufficient documentation

## 2017-05-31 DIAGNOSIS — F1010 Alcohol abuse, uncomplicated: Secondary | ICD-10-CM | POA: Insufficient documentation

## 2017-05-31 DIAGNOSIS — I2510 Atherosclerotic heart disease of native coronary artery without angina pectoris: Secondary | ICD-10-CM | POA: Diagnosis not present

## 2017-05-31 DIAGNOSIS — H25813 Combined forms of age-related cataract, bilateral: Secondary | ICD-10-CM | POA: Diagnosis not present

## 2017-05-31 DIAGNOSIS — E0789 Other specified disorders of thyroid: Secondary | ICD-10-CM | POA: Diagnosis not present

## 2017-05-31 DIAGNOSIS — R0683 Snoring: Secondary | ICD-10-CM

## 2017-05-31 DIAGNOSIS — F17220 Nicotine dependence, chewing tobacco, uncomplicated: Secondary | ICD-10-CM | POA: Diagnosis not present

## 2017-05-31 NOTE — Progress Notes (Signed)
  Echocardiogram 2D Echocardiogram has been performed.  Merrie Roof F 05/31/2017, 3:19 PM

## 2017-05-31 NOTE — Telephone Encounter (Signed)
Directions given to patient and driver for CHF clinic.

## 2017-05-31 NOTE — Progress Notes (Signed)
Advanced Heart Failure Clinic Note   Primary Care: Dr. Alain Honey PCP-Cardiologist:   HPI: Brad Mendoza is a 62 y.o. male with a past medical history of systolic HF due to NICM, HTN, HLD, DM and CAD.   [place] June 2005 with 40-50% mid LAD stenosis, EF 45% at that time.    Admitted on 06/01/16 with chest pain relieved with nitroglycerin and volume overload. Troponin negative. Admission weight was 230 pounds. Right and left heart cath on 06/03/16 with 70-75% stenosis in the proximal and mid LAD,90% ostial stenosis in the 1st diagonal, and  60-70% stenosis in the mid RCA. RHC showed Fick C.O. 5.86/index 2.74. PA mean 33 mm Hg, LVEDP 25 mm Hg. EF by cath appears to be <25%. Medical therapy was recommended in the absence of ACS, and PCI was felt to be high risk with severe cardiomyopathy. CVTS was consulted and felt that CABG would not benefit him as he had NICM prior to his CAD. Discharge weight 207 pounds.   He presents today for routine follow-up. Says he feels ok but gets tired easily. Can work in yard with [employer] and weeding. Takes frequent breaks. Denies SOB after fluid was removed. No orthopnea, PND or edema. No dizziness. Weight stable 211-215. Saw Dr. Lowanda Foster yesterday and torsemide was dropped from 80 to 40 daily. Told him he had a lot of protein in his urine. Sleeps a lot. Denies snoring. Denies ETOH use.   Echo today reviewed personally and EF 25%   Review of systems complete and found to be negative unless listed in HPI.    [facility] 05/2016 Showed 70-75% stenosis in the proximal and mid LAD,90% ostial stenosis in the 1st diagonal, and  60-70% stenosis in the mid RCA. [place] 05/2016-  Fick C.O. 5.86/index 2.74  Past Medical History:  Diagnosis Date  . Acid reflux   . Back pain    f4 and f5  . CAD (coronary artery disease)   . CHF (congestive heart failure) ([place])    last echo was in March 2016  . Diabetes mellitus 2005  . Diverticulosis   . H/O chest pain 2005   ECHO  . HTN  (hypertension)   . Hyperlipidemia   . Nonischemic cardiomyopathy ([place])   . Onychomycosis   . Polysubstance abuse ([place])     Current Outpatient Medications  Medication Sig Dispense Refill  . albuterol (PROVENTIL HFA;VENTOLIN HFA) 108 (90 Base) MCG/ACT inhaler Inhale 2 puffs into the lungs every 6 (six) hours as needed for wheezing or shortness of breath. 1 Inhaler 2  . aspirin EC 81 MG tablet Take 81 mg by mouth daily.    . Capsaicin (ICY HOT ARTHRITIS THERAPY EX) Apply 1 application topically 2 (two) times daily as needed (back pain).     . carvedilol (COREG) 12.5 MG tablet TAKE ONE TABLET TWICE A DAY WITH FOOD 180 tablet 1  . Ergocalciferol (VITAMIN D2) 400 units TABS Take 1 tablet by mouth daily.    . fluticasone (FLONASE) 50 MCG/ACT nasal spray USE 2 SPRAYS IN EACH NOSTRIL DAILY 48 g 1  . hydrALAZINE (APRESOLINE) 50 MG tablet Take 1 tablet (50 mg total) by mouth 3 (three) times daily. 90 tablet 11  . isosorbide mononitrate (IMDUR) 60 MG 24 hr tablet Take 1.5 tablets (90 mg total) by mouth daily. 135 tablet 1  . metFORMIN (GLUCOPHAGE-XR) 500 MG 24 hr tablet Take 1 tablet (500 mg total) by mouth daily with breakfast. (Patient taking differently: Take 500 mg by mouth  daily as needed (for blood sugars). ) 90 tablet 1  . Multiple Vitamins-Minerals (CENTRUM SILVER ADULT 50+ PO) Take 1 tablet by mouth daily.    . nitroGLYCERIN (NITROSTAT) 0.4 MG SL tablet DISSOLVE 1 TAB UNDER TOUNGE FOR CHEST PAIN. MAY REPEAT EVERY 5 MINUTES FOR 3 DOSES. IF NO RELIEF CALL 911 OR GO TO ER 25 tablet 2  . Omega-3 Fatty Acids (FISH OIL) 1000 MG CPDR Take 1 capsule by mouth daily.    . pantoprazole (PROTONIX) 40 MG tablet TAKE ONE (1) TABLET EACH DAY 90 tablet 1  . RANEXA 500 MG 12 hr tablet TAKE ONE TABLET BY MOUTH TWICE DAILY 60 tablet 6  . rosuvastatin (CRESTOR) 10 MG tablet TAKE ONE (1) TABLET EACH DAY 90 tablet 1  . sacubitril-valsartan (ENTRESTO) 97-103 MG Take 1 tablet by mouth 2 (two) times daily. 180 tablet 3    . spironolactone (ALDACTONE) 25 MG tablet Take 0.5 tablets (12.5 mg total) by mouth daily. 45 tablet 3  . terbinafine (LAMISIL) 1 % cream Apply 1 application topically 2 (two) times daily. Apply to both feet and between toes 30 g 1  . torsemide (DEMADEX) 20 MG tablet Take 40 mg by mouth daily.    [name] 300 UNIT/ML SOPN Inject 15-40 Units into the skin See admin instructions. Inject 40 units in the morning and 15 units at night    . ULTICARE MINI PEN NEEDLES 31G X 6 MM MISC 4 TIMES DAILY 100 each 2   No current facility-administered medications for this encounter.     Allergies  Allergen Reactions  . Sulfa Antibiotics Shortness Of Breath  . Penicillins Rash    Has patient had a PCN reaction causing immediate rash, facial/tongue/throat swelling, SOB or lightheadedness with hypotension: No Has patient had a PCN reaction causing severe rash involving mucus membranes or skin necrosis: No Has patient had a PCN reaction that required hospitalization No Has patient had a PCN reaction occurring within the last 10 years: No If all of the above answers are "NO", then may proceed with Cephalosporin use.    Social History   Socioeconomic History  . Marital status: Married    Spouse name: Not on file  . Number of children: Not on file  . Years of education: Not on file  . Highest education level: Not on file  Occupational History  . Occupation: disabled  Social Needs  . Financial resource strain: Not on file  . Food insecurity:    Worry: Not on file    Inability: Not on file  . Transportation needs:    Medical: Not on file    Non-medical: Not on file  Tobacco Use  . Smoking status: Never Smoker  . Smokeless tobacco: Current User    Types: Snuff  . Tobacco comment: dips snuff about 6 cans/week for 3 years  Substance and Sexual Activity  . Alcohol use: Yes    Alcohol/week: 0.6 oz    Types: 1 Cans of beer per week    Comment: occ - has cut down, now once every 2-3 months   . Drug use: Yes    Types: Marijuana    Comment: occasional use - smoking less  . Sexual activity: Yes  Lifestyle  . Physical activity:    Days per week: Not on file    Minutes per session: Not on file  . Stress: Not on file  Relationships  . Social connections:    Talks on phone: Not on file  Gets together: Not on file    Attends religious service: Not on file    Active member of club or organization: Not on file    Attends meetings of clubs or organizations: Not on file    Relationship status: Not on file  . Intimate partner violence:    Fear of current or ex partner: Not on file    Emotionally abused: Not on file    Physically abused: Not on file    Forced sexual activity: Not on file  Other Topics Concern  . Not on file  Social History Narrative  . Not on file    Family History  Problem Relation Age of Onset  . Hypertension Father   . Diabetes Father   . Cancer Mother   . Alcohol abuse Brother   . Colon cancer Neg Hx    Vitals:   05/31/17 1515  BP: 114/74  Pulse: 70  SpO2: 100%  Weight: 211 lb 8 oz (95.9 kg)   Wt Readings from Last 3 Encounters:  05/31/17 211 lb 8 oz (95.9 kg)  03/30/17 227 lb (103 kg)  03/16/17 228 lb (103.4 kg)    PHYSICAL EXAM: General:  Obese male sitting on exam table. No resp difficulty HEENT: normal anicteric  Neck: supple. no JVD. Carotids 2+ bilat; no bruits. No lymphadenopathy appreciated. Thyroid mildly prominent.  Cor: PMI nondisplaced. Regular rate & rhythm. No rubs, gallops or murmurs. Lungs: clear with decreased BS throughout Abdomen: obese soft, nontender, nondistended. No hepatosplenomegaly. No bruits or masses. Good bowel sounds. Extremities: no cyanosis, clubbing, rash, edema Neuro: alert & orientedx3, cranial nerves grossly intact. moves all 4 extremities w/o difficulty. Affect pleasant   ASSESSMENT & PLAN: 1. 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 He had preexisting NICM diagnosed in 2005 when his EF was 35%  and had normal cors. Echo in 05/2016 with EF 30-35% and cath showed  70-75% stenosis in the proximal and mid LAD,90% ostial stenosis in the 1st diagonal, and  60-70% stenosis in the mid RCA. [place] 05/2016-  Fick C.O. 5.86/index 2.74 - He is improving slowly and now seems dedicated to working on his HF.  - NYHA II-III - Volume status improved on torsemide  - Continue torsemide 40 daily  - Continue Entresto 97/103 mg BID.  - Continue Coreg 12.5 mg BID - Continue isosorbide 90 mg daily.  - Continue  hydralazine to 50 mg TID.  - Continue spiro 25mg  daily - Reinforced fluid restriction to < 2 L daily, sodium restriction to less than 2000 mg daily, and the importance of daily weights.     2. CAD - [facility] 05/2016 showed 70-75% stenosis in the proximal and mid LAD,90% ostial stenosis in the 1st diagonal, and  60-70% stenosis in the mid RCA. [place] 05/2016-  Fick C.O. 5.86/index 2.74 Medical management recommended.  - PCI to LAD felt to be high risk in the setting of cardiomyopathy, CVTS consulted felt that CABG would not improve EF as he has pre existing CM. Without clear angina would continue medical therapy - Continue isosorbide and Ranexa - Continue ASA and statin.   3. ETOH abuse - Congratulated him on cessation   4. Tobacco abuse - Chews tobacco.  - Encouraged complete cessation.   5. HTN - Blood pressure well controlled. Continue current regimen.  6. CKD stage III - Baseline creatinine 1.7-2.2. Recheck today - Follows with Dr. Hinda Lenis  7. DM - Per PCP.   8. Thyroid prominence - check thyroid u/s  9. Fatigue - check sleep study   Glori Bickers, MD 05/31/17

## 2017-05-31 NOTE — Patient Instructions (Signed)
Thyroid Ultrasound  Your physician has recommended that you have a sleep study. This test records several body functions during sleep, including: brain activity, eye movement, oxygen and carbon dioxide blood levels, heart rate and rhythm, breathing rate and rhythm, the flow of air through your mouth and nose, snoring, body muscle movements, and chest and belly movement.  We will contact you in 4-6 months to schedule your next appointment.

## 2017-06-09 ENCOUNTER — Other Ambulatory Visit: Payer: Self-pay

## 2017-06-09 NOTE — Patient Outreach (Signed)
[facility]) Care Management  [place]  06/09/2017   Brad Mendoza April 28, 1955 [id number]  Subjective: Telephone call to the patient for monthly assessment. HIPAA verified.  The patient states that he is doing well. The patient he denies any pain,breathing issues or swelling.  He states that he did have a fall 3 weeks ago and bruised his hip but he did not call his physician.  RN Health Coach discussed fall precautions with the patient and he verbalized understanding.  He states that he is staying active by working in his yard and garden.  He states that he had an appointment with his cardiologist on April 10 th and will se them again in two months.  He also had an eye appointment 2 weeks ago and will have a sleep study done on May 1 st,  Encounter Medications:  Outpatient Encounter Medications as of 06/09/2017  Medication Sig Note  . albuterol (PROVENTIL HFA;VENTOLIN HFA) 108 (90 Base) MCG/ACT inhaler Inhale 2 puffs into the lungs every 6 (six) hours as needed for wheezing or shortness of breath. 05/17/2017: Takes <1x/week   . aspirin EC 81 MG tablet Take 81 mg by mouth daily.   . Capsaicin (ICY HOT ARTHRITIS THERAPY EX) Apply 1 application topically 2 (two) times daily as needed (back pain).    . carvedilol (COREG) 12.5 MG tablet TAKE ONE TABLET TWICE A DAY WITH FOOD   . Ergocalciferol (VITAMIN D2) 400 units TABS Take 1 tablet by mouth daily.   . fluticasone (FLONASE) 50 MCG/ACT nasal spray USE 2 SPRAYS IN EACH NOSTRIL DAILY   . hydrALAZINE (APRESOLINE) 50 MG tablet Take 1 tablet (50 mg total) by mouth 3 (three) times daily.   . isosorbide mononitrate (IMDUR) 60 MG 24 hr tablet Take 1.5 tablets (90 mg total) by mouth daily.   . metFORMIN (GLUCOPHAGE-XR) 500 MG 24 hr tablet Take 1 tablet (500 mg total) by mouth daily with breakfast. (Patient taking differently: Take 500 mg by mouth daily as needed (for blood sugars). )   . Multiple Vitamins-Minerals (CENTRUM SILVER  ADULT 50+ PO) Take 1 tablet by mouth daily.   . nitroGLYCERIN (NITROSTAT) 0.4 MG SL tablet DISSOLVE 1 TAB UNDER TOUNGE FOR CHEST PAIN. MAY REPEAT EVERY 5 MINUTES FOR 3 DOSES. IF NO RELIEF CALL 911 OR GO TO ER   . Omega-3 Fatty Acids (FISH OIL) 1000 MG CPDR Take 1 capsule by mouth daily.   . pantoprazole (PROTONIX) 40 MG tablet TAKE ONE (1) TABLET EACH DAY   . RANEXA 500 MG 12 hr tablet TAKE ONE TABLET BY MOUTH TWICE DAILY   . rosuvastatin (CRESTOR) 10 MG tablet TAKE ONE (1) TABLET EACH DAY   . sacubitril-valsartan (ENTRESTO) 97-103 MG Take 1 tablet by mouth 2 (two) times daily.   [name] spironolactone (ALDACTONE) 25 MG tablet Take 0.5 tablets (12.5 mg total) by mouth daily.   [name] terbinafine (LAMISIL) 1 % cream Apply 1 application topically 2 (two) times daily. Apply to both feet and between toes   . torsemide (DEMADEX) 20 MG tablet Take 40 mg by mouth daily.   [name] 300 UNIT/ML SOPN Inject 15-40 Units into the skin See admin instructions. Inject 40 units in the morning and 15 units at night   . ULTICARE MINI PEN NEEDLES 31G X 6 MM MISC 4 TIMES DAILY    No facility-administered encounter medications on file as of 06/09/2017.     Functional Status:  In your present state of health,  do you have any difficulty performing the following activities: 12/14/2016 09/26/2016  Hearing? N N  Vision? Y N  Difficulty concentrating or making decisions? N N  Walking or climbing stairs? N N  Dressing or bathing? Y N  Doing errands, shopping? Y N  Preparing Food and eating ? N N  Using the Toilet? N N  In the past six months, have you accidently leaked urine? N N  Do you have problems with loss of bowel control? N N  Managing your Medications? N N  Comment - -  Managing your Finances? N N  Housekeeping or managing your Housekeeping? N N  Some recent data might be hidden    Fall/Depression Screening: Fall Risk  06/09/2017 05/09/2017 04/07/2017  Falls in the past year? Yes No No  Comment 3 weeks ago  - -  Number falls in past yr: 1 - -  Injury with Fall? Yes - -  Comment Bruised his hip - -  Risk for fall due to : - - -  Follow up Falls evaluation completed;Education provided - -   PHQ 2/9 Scores 12/14/2016 12/02/2016 09/26/2016 09/14/2016 09/01/2016 08/22/2016 07/07/2016  PHQ - 2 Score 1 0 0 0 0 0 0    Assessment: Patient continues to benefit from health coach outreach for disease management and support.    THN CM Care Plan Problem One     Most Recent Value  THN Long Term Goal   In 90 days the patient will verbalize he has not been seen in the hospital  [place] Term Goal Start Date  06/09/17  Interventions for Problem One Long Term Goal  discussed with patient the importance of daily weight monitoring and when to notify the physician of weight gain, encouraged patient to weigh daily, verified with patient correct dose of fluid pills he should be taking from his last appointment with his cardiologist, encouraged medication compliance     Plan: [place] will contact patient in the month of May and patient agrees to next outreach.  [name] RN, BSN, [place] Direct Dial:  [phone]  Fax: [phone]

## 2017-06-14 NOTE — Addendum Note (Signed)
Encounter addended by: Scarlette Calico, RN on: 06/14/2017 10:30 AM  Actions taken: Diagnosis association updated, Order list changed

## 2017-06-20 ENCOUNTER — Other Ambulatory Visit: Payer: Self-pay | Admitting: Licensed Clinical Social Worker

## 2017-06-20 NOTE — Patient Outreach (Signed)
Assessment:  CSW spoke via phone with client. CSW verified client identity. CSW received verbal permission from client for CSW to speak with client about client needs. CSW spoke with [name] about client care plan.  CSW encouraged that [place] communicate with CSW in next 30 days to continue to discuss financial needs of client and to discuss financial resources for [place] in the area. Client said he makes a monthly payment for a small amount to [facility] and to [facility]. Client said he is starting to use [place] to help him with his transport needs. Client has adequate food supply. He said his son plans to help client with some of the roof repairs needed for client. CSW spoke with client about financial management and use of food pantries and transport agency to help client offset some of his monthly expenses. CSW thanked [place] for phone call with CSW on 06/20/17. CSW encouraged [place] to call CSW at [phone] as needed to discuss social work needs of client.   Plan:  Client to continue to communicate with CSW in next 30 days to discuss financial needs of client  and to discuss financial resources for [place] in the area.   CSW to collaborate with [place], [name], in monitoring needs of client.    CSW to call client in 4 weeks to assess client needs.  [name].Deshawnda Acrey MSW, LCSW Licensed Clinical Social Worker [facility] Care Management [phone]

## 2017-06-21 ENCOUNTER — Encounter (HOSPITAL_BASED_OUTPATIENT_CLINIC_OR_DEPARTMENT_OTHER): Payer: Medicare Other

## 2017-06-22 ENCOUNTER — Other Ambulatory Visit: Payer: Medicare Other

## 2017-06-30 ENCOUNTER — Other Ambulatory Visit: Payer: Medicare Other

## 2017-07-04 ENCOUNTER — Encounter (HOSPITAL_COMMUNITY): Payer: Self-pay | Admitting: *Deleted

## 2017-07-04 ENCOUNTER — Telehealth (HOSPITAL_COMMUNITY): Payer: Self-pay | Admitting: *Deleted

## 2017-07-04 NOTE — Telephone Encounter (Signed)
Pt cx'd his thyroid u/s twice and has not rescheduled, letter mailed to pt

## 2017-07-12 ENCOUNTER — Telehealth (HOSPITAL_COMMUNITY): Payer: Self-pay

## 2017-07-12 NOTE — Telephone Encounter (Addendum)
Patient calling CHF clinic triage line to follow up on letter he received from our office to reschedule a thyroid ultrasound. Patient said he cancelled that test and does not wish to have this done at this time "I feel like I'm alright but I will call you guys if I change my mind". Advised and educated on importance and ease of this test, however patient still refuses to complete at this time. Advised to call if he changes his mind and reminded that he will need to call back in August to make October (6 month) follow up appointment. Aware and agreeable.  Renee Pain, RN

## 2017-07-13 ENCOUNTER — Encounter (HOSPITAL_BASED_OUTPATIENT_CLINIC_OR_DEPARTMENT_OTHER): Payer: Medicare Other

## 2017-07-18 NOTE — Progress Notes (Addendum)
[facility] Note  07/19/2017     CHIEF COMPLAINT Patient presents for Retina Follow Up   HISTORY OF PRESENT ILLNESS: Brad Mendoza is a 62 y.o. male who presents to the clinic today for:   HPI    Retina Follow Up    Patient presents with  Diabetic Retinopathy.  In both eyes.  Severity is moderate.  Duration of 7 weeks.  Since onset it is stable.  I, the attending physician,  performed the HPI with the patient and updated documentation appropriately.          Comments    F/U NPDR OU; Pt states OU VA continues to be blurred; Pt states he "doesn't see like I want to"; Pt states OU feel like "there is sand in them all the time"; Pt reports last CBG was 140, pt states CBG has been stable; Pt reports last A1C is unknown; Pt denies floaters, denies flashes, denies wavy VA; Pt reports having headaches;        Last edited by [name], MD on 07/19/2017  8:13 AM. (History)      Referring physician: [name], MD [place], [place] [id number]  HISTORICAL INFORMATION:   Selected notes from the MEDICAL RECORD NUMBER Referred by Dr. [name] for concern of CSME OS;  LEE- 04.04.19 ([name]) [BCVA OD: 20/20 OS: 20/40] Ocular Hx-  PMH- DM, CAD, CHF, HTN, hyperlipidemia    CURRENT MEDICATIONS: No current outpatient medications on file. (Ophthalmic Drugs)   No current facility-administered medications for this visit.  (Ophthalmic Drugs)   Current Outpatient Medications (Other)  Medication Sig  . albuterol (PROVENTIL HFA;VENTOLIN HFA) 108 (90 Base) MCG/ACT inhaler Inhale 2 puffs into the lungs every 6 (six) hours as needed for wheezing or shortness of breath.  [name] aspirin EC 81 MG tablet Take 81 mg by mouth daily.  . Capsaicin (ICY HOT ARTHRITIS THERAPY EX) Apply 1 application topically 2 (two) times daily as needed (back pain).   . carvedilol (COREG) 12.5 MG tablet TAKE ONE TABLET TWICE A DAY WITH FOOD  . Ergocalciferol (VITAMIN D2) 400 units  TABS Take 1 tablet by mouth daily.  . fluticasone (FLONASE) 50 MCG/ACT nasal spray USE 2 SPRAYS IN EACH NOSTRIL DAILY  . hydrALAZINE (APRESOLINE) 50 MG tablet Take 1 tablet (50 mg total) by mouth 3 (three) times daily.  . isosorbide mononitrate (IMDUR) 60 MG 24 hr tablet Take 1.5 tablets (90 mg total) by mouth daily.  . metFORMIN (GLUCOPHAGE-XR) 500 MG 24 hr tablet Take 1 tablet (500 mg total) by mouth daily with breakfast. (Patient taking differently: Take 500 mg by mouth daily as needed (for blood sugars). )  . Multiple Vitamins-Minerals (CENTRUM SILVER ADULT 50+ PO) Take 1 tablet by mouth daily.  . nitroGLYCERIN (NITROSTAT) 0.4 MG SL tablet DISSOLVE 1 TAB UNDER TOUNGE FOR CHEST PAIN. MAY REPEAT EVERY 5 MINUTES FOR 3 DOSES. IF NO RELIEF CALL 911 OR GO TO ER  . Omega-3 Fatty Acids (FISH OIL) 1000 MG CPDR Take 1 capsule by mouth daily.  . pantoprazole (PROTONIX) 40 MG tablet TAKE ONE (1) TABLET EACH DAY  . RANEXA 500 MG 12 hr tablet TAKE ONE TABLET BY MOUTH TWICE DAILY  . rosuvastatin (CRESTOR) 10 MG tablet TAKE ONE (1) TABLET EACH DAY  . sacubitril-valsartan (ENTRESTO) 97-103 MG Take 1 tablet by mouth 2 (two) times daily.  [name] spironolactone (ALDACTONE) 25 MG tablet Take 0.5 tablets (12.5 mg total) by  mouth daily.  [name] terbinafine (LAMISIL) 1 % cream Apply 1 application topically 2 (two) times daily. Apply to both feet and between toes  . torsemide (DEMADEX) 20 MG tablet Take 40 mg by mouth daily.  [name] 300 UNIT/ML SOPN Inject 15-40 Units into the skin See admin instructions. Inject 40 units in the morning and 15 units at night  . ULTICARE MINI PEN NEEDLES 31G X 6 MM MISC 4 TIMES DAILY   No current facility-administered medications for this visit.  (Other)      REVIEW OF SYSTEMS: ROS    Positive for: Endocrine, Cardiovascular, Eyes   Negative for: Constitutional, Gastrointestinal, Neurological, Skin, Genitourinary, Musculoskeletal, HENT, Respiratory, Psychiatric, Allergic/Imm,  Heme/Lymph   Last edited by Cherrie Gauze, COA on 07/19/2017  7:55 AM. (History)       ALLERGIES Allergies  Allergen Reactions  . Sulfa Antibiotics Shortness Of Breath  . Penicillins Rash    Has patient had a PCN reaction causing immediate rash, facial/tongue/throat swelling, SOB or lightheadedness with hypotension: No Has patient had a PCN reaction causing severe rash involving mucus membranes or skin necrosis: No Has patient had a PCN reaction that required hospitalization No Has patient had a PCN reaction occurring within the last 10 years: No If all of the above answers are "NO", then may proceed with Cephalosporin use.     PAST MEDICAL HISTORY Past Medical History:  Diagnosis Date  . Acid reflux   . Back pain    f4 and f5  . CAD (coronary artery disease)   . CHF (congestive heart failure) ([place])    last echo was in March 2016  . Diabetes mellitus 2005  . Diverticulosis   . H/O chest pain 2005   ECHO  . HTN (hypertension)   . Hyperlipidemia   . Nonischemic cardiomyopathy ([place])   . Onychomycosis   . Polysubstance abuse [facility])    Past Surgical History:  Procedure Laterality Date  . APPENDECTOMY    . CARDIAC CATHETERIZATION  2005   4 frech cath  . COLONOSCOPY N/A 08/01/2013   Procedure: COLONOSCOPY;  Surgeon: Rogene Houston, MD;  [facility];  Service: Endoscopy;  Laterality: N/A;  rescheduled to [place] notified pt  . ESOPHAGOGASTRODUODENOSCOPY N/A 04/23/2015   Procedure: ESOPHAGOGASTRODUODENOSCOPY (EGD);  Surgeon: Rogene Houston, MD;  Location: [facility];  Service: Endoscopy;  Laterality: N/A;  1:25  . RIGHT/LEFT HEART CATH AND CORONARY ANGIOGRAPHY N/A 06/03/2016   Procedure: Right/Left Heart Cath and Coronary Angiography;  Surgeon: Belva Crome, MD;  Location: [place] [facility] LAB;  Service: Cardiovascular;  Laterality: N/A;    FAMILY HISTORY Family History  Problem Relation Age of Onset  . Hypertension Father   . Diabetes Father   . Cancer  Mother   . Alcohol abuse Brother   . Colon cancer Neg Hx     SOCIAL HISTORY Social History   Tobacco Use  . Smoking status: Never Smoker  . Smokeless tobacco: Current User    Types: Snuff  . Tobacco comment: dips snuff about 6 cans/week for 3 years  Substance Use Topics  . Alcohol use: Yes    Alcohol/week: 0.6 oz    Types: 1 Cans of beer per week    Comment: occ - has cut down, now once every 2-3 months  . Drug use: Yes    Types: Marijuana    Comment: occasional use - smoking less         OPHTHALMIC EXAM:  Base Eye Exam    Visual Acuity (Snellen - Linear)      Right Left   Dist cc 20/20 20/50   Dist ph cc 20/20 20/40   Correction:  Glasses       Tonometry (Tonopen, 8:05 AM)      Right Left   Pressure 14 13       Pupils      Dark Light Shape React APD   Right 4 3 Round Brisk None   Left 4 3 Round Brisk None       Visual Febo (Counting fingers)      Left Right    Full Full       Extraocular Movement      Right Left    Full, Ortho Full, Ortho       Neuro/Psych    Oriented x3:  Yes   Mood/Affect:  Normal       Dilation    Both eyes:  1.0% Mydriacyl, 2.5% Phenylephrine @ 8:05 AM        Slit Lamp and Fundus Exam    Slit Lamp Exam      Right Left   Lids/Lashes Dermatochalasis - upper lid Dermatochalasis - upper lid   Conjunctiva/Sclera Melanosis Melanosis   Cornea Arcus, trace inferior and central Punctate epithelial erosions Arcus, trace inferior and central Punctate epithelial erosions, round k scar inferonasal para-central   Anterior Chamber Deep and quiet Deep and quiet   Iris Round and dilated, No NVI Round and dilated, No NVI   Lens 2+ Nuclear sclerosis, 2+ Cortical cataract, Vacuoles 2+ Nuclear sclerosis, 2+ Cortical cataract, Vacuoles   Vitreous Vitreous syneresis Vitreous syneresis       Fundus Exam      Right Left   Disc Normal Normal   C/D Ratio 0.4 0.4   Macula Good foveal reflex, scattered Microaneurysms, DBH, puncate exudates  Blunted foveal reflex, +edema slightly worse, scattered MAs, scattered DBH, scattered exudates   Vessels Mild Vascular attenuation Mild Vascular attenuation   Periphery Attached, scattered MAs, few CWS posteriorly Attached, scattered DBH, scattered CWS          IMAGING AND PROCEDURES  Imaging and Procedures for 05/31/17  OCT, Retina - OU - Both Eyes       Right Eye Quality was good. Central Foveal Thickness: 268. Progression has been stable. Findings include normal foveal contour, no SRF, intraretinal fluid (Mild interval increase in cystic changes).   Left Eye Quality was good. Central Foveal Thickness: 319. Progression has worsened. Findings include normal foveal contour, intraretinal fluid, no SRF (Interval increase in IRF).   Notes *Images captured and stored on drive  Diagnosis / Impression:  DME OU, OS>OD Mild interval increase in cystic changes OD Interval increase in IRF OS  Clinical management:  See below  Abbreviations: NFP - Normal foveal profile. CME - cystoid macular edema. PED - pigment epithelial detachment. IRF - intraretinal fluid. SRF - subretinal fluid. EZ - ellipsoid zone. ERM - epiretinal membrane. ORA - outer retinal atrophy. ORT - outer retinal tubulation. SRHM - subretinal hyper-reflective material         Intravitreal Injection, Pharmacologic Agent - OS - Left Eye       Time Out 07/19/2017. 8:44 AM. Confirmed correct patient, procedure, site, and patient consented.   Anesthesia Topical anesthesia was used. Anesthetic medications included Tetracaine 0.5%, Lidocaine 2%.   Procedure Preparation included 5% betadine to ocular surface. A supplied needle was used.   Injection: 1.25 mg Bevacizumab  1.25mg /0.20ml   NDC: 39030-092-33    Lot: [id number]    Expiration Date: 09/13/2017   Route: Intravitreal   Site: Left Eye   Waste: 0 mg  Post-op Post injection exam found visual acuity of at least counting fingers. The patient tolerated the procedure  well. There were no complications. The patient received written and verbal post procedure care education.                 ASSESSMENT/PLAN:    ICD-10-CM   1. Moderate nonproliferative diabetic retinopathy of both eyes with macular edema associated with type 2 diabetes mellitus (HCC) E11.3313 OCT, Retina - OU - Both Eyes    Intravitreal Injection, Pharmacologic Agent - OS - Left Eye  2. Retinal edema H35.81 OCT, Retina - OU - Both Eyes  3. Combined forms of age-related cataract of both eyes H25.813     1, 2. Moderate to severe non-proliferative diabetic retinopathy, OU - The incidence, risk factors for progression, natural history and treatment options for diabetic retinopathy  were discussed with patient.   - The need for close monitoring of blood glucose, blood pressure, and serum lipids, avoiding cigarette or any type of tobacco, and the need for long term follow up was also discussed with patient. - exam shows scattered [place], exudates, and cotton wool spots, OS > OD, no NV - FA 4.10.19 shows no NV OU - OCT shows interval worsening of diabetic macular edema, OU (OS > OD) The natural history, pathology, and characteristics of diabetic macular edema discussed with patient.  A generalized discussion of the major clinical trials concerning treatment of diabetic macular edema (ETDRS, DCT, SCORE, RISE / RIDE, and ongoing DRCR net studies) was completed.  This discussion included mention of the various approaches to treating diabetic macular edema (observation, laser photocoagulation, anti-VEGF injections with lucentis / Avastin / Eylea, steroid injections with Kenalog / Ozurdex, and intraocular surgery with vitrectomy).  The goal hemoglobin A1C of 6-7 was discussed, as well as importance of smoking cessation and hypertension control.  Need for ongoing treatment and monitoring were specifically discussed with reference to chronic nature of diabetic macular edema. - BCVA decreased from 20/25 to  20/40 OS - recommend IVA #1 OS today, 5.29.19 - pt wishes to proceed - RBA of procedure discussed, questions answered - informed consent obtained and signed - see procedure note - f/u in 4 wks  3. Combined form age-related cataract OU-  - The symptoms of cataract, surgical options, and treatments and risks were discussed with patient. - discussed diagnosis and progression - not yet visually significant - monitor for now   Ophthalmic Meds Ordered this visit:  No orders of the defined types were placed in this encounter.      Return in about 1 month (around 08/16/2017) for Dilated Exam, OCT, Possible Injxn.  There are no Patient Instructions on file for this visit.   Explained the diagnoses, plan, and follow up with the patient and they expressed understanding.  Patient expressed understanding of the importance of proper follow up care.   This document serves as a record of services personally performed by Gardiner Sleeper, MD, PhD. It was created on their behalf by Catha Brow, [place], a certified ophthalmic assistant. The creation of this record is the provider's dictation and/or activities during the visit.  Electronically signed by: Catha Brow, COA  05.28.19 8:49 AM   Gardiner Sleeper, M.D., Ph.D. Diseases & Surgery of the Retina and Vitreous [facility] 07/19/17   I have reviewed the above documentation for accuracy and completeness, and I agree with the above. Gardiner Sleeper, M.D., Ph.D. 07/19/17 8:49 AM    Abbreviations: M myopia (nearsighted); A astigmatism; H hyperopia (farsighted); P presbyopia; Mrx spectacle prescription;  CTL contact lenses; OD right eye; OS left eye; OU both eyes  XT exotropia; ET esotropia; PEK punctate epithelial keratitis; PEE punctate epithelial erosions; DES dry eye syndrome; MGD meibomian gland dysfunction; ATs artificial tears; PFAT's preservative free artificial tears; [place] nuclear sclerotic cataract; PSC  posterior subcapsular cataract; ERM epi-retinal membrane; PVD posterior vitreous detachment; RD retinal detachment; DM diabetes mellitus; DR diabetic retinopathy; NPDR non-proliferative diabetic retinopathy; PDR proliferative diabetic retinopathy; CSME clinically significant macular edema; DME diabetic macular edema; dbh dot blot hemorrhages; CWS cotton wool spot; POAG primary open angle glaucoma; C/D cup-to-disc ratio; HVF humphrey visual field; GVF goldmann visual field; OCT optical coherence tomography; IOP intraocular pressure; BRVO Branch retinal vein occlusion; CRVO central retinal vein occlusion; CRAO central retinal artery occlusion; BRAO branch retinal artery occlusion; RT retinal tear; SB scleral buckle; PPV pars plana vitrectomy; VH Vitreous hemorrhage; PRP panretinal laser photocoagulation; IVK intravitreal kenalog; VMT vitreomacular traction; MH Macular hole;  NVD neovascularization of the disc; NVE neovascularization elsewhere; AREDS age related eye disease study; ARMD age related macular degeneration; POAG primary open angle glaucoma; EBMD epithelial/anterior basement membrane dystrophy; ACIOL anterior chamber intraocular lens; IOL intraocular lens; PCIOL posterior chamber intraocular lens; Phaco/IOL phacoemulsification with intraocular lens placement; [place] photorefractive keratectomy; LASIK laser assisted in situ keratomileusis; HTN hypertension; DM diabetes mellitus; COPD chronic obstructive pulmonary disease

## 2017-07-19 ENCOUNTER — Ambulatory Visit (INDEPENDENT_AMBULATORY_CARE_PROVIDER_SITE_OTHER): Payer: Medicare Other | Admitting: Ophthalmology

## 2017-07-19 ENCOUNTER — Encounter (INDEPENDENT_AMBULATORY_CARE_PROVIDER_SITE_OTHER): Payer: Self-pay | Admitting: Ophthalmology

## 2017-07-19 DIAGNOSIS — H25813 Combined forms of age-related cataract, bilateral: Secondary | ICD-10-CM | POA: Diagnosis not present

## 2017-07-19 DIAGNOSIS — H3581 Retinal edema: Secondary | ICD-10-CM

## 2017-07-19 DIAGNOSIS — E113313 Type 2 diabetes mellitus with moderate nonproliferative diabetic retinopathy with macular edema, bilateral: Secondary | ICD-10-CM | POA: Diagnosis not present

## 2017-07-19 MED ORDER — BEVACIZUMAB CHEMO INJECTION 1.25MG/0.05ML SYRINGE FOR KALEIDOSCOPE
1.2500 mg | INTRAVITREAL | Status: DC
  Administered 2017-07-19: 1.25 mg via INTRAVITREAL

## 2017-07-21 ENCOUNTER — Encounter: Payer: Self-pay | Admitting: Licensed Clinical Social Worker

## 2017-07-21 ENCOUNTER — Other Ambulatory Visit: Payer: Self-pay | Admitting: Licensed Clinical Social Worker

## 2017-07-21 ENCOUNTER — Other Ambulatory Visit: Payer: Self-pay

## 2017-07-21 NOTE — Patient Outreach (Signed)
Assessment:  CSW spoke via phone with client. CSW verified client identity. CSW received verbal permission from client for CSW to speak with client about client needs. CSW informed Am[place] that he had met his care plan goals with [facility] CSW services. Thus, since client has met his care plan goals with CSW services, CSW informed [place] that [facility] would discharge  client today from [facility] [place] services only. Client would continue to receive telephonic support with [place], [name].  Client agreed to this plan. CSW congratulated Sakari on reaching his care plan goals with CSW services.  CSW encouraged client to continue to communicate as scheduled with [place], [name].      Plan:  CSW is discharging [facility] from [facility] on 07/21/17 since client has met client's care plan goals with CSW services.   CSW to fax physician letter to Dr. Assunta Found informing Dr. Evette Doffing that [place] [facility] discharged client today from [facility] services and [place], [name], continues to provide telephonic nursing support for client.   [name].Blossie Raffel MSW, LCSW Licensed Clinical Social Worker [facility] Care Management [phone]

## 2017-07-21 NOTE — Patient Outreach (Signed)
[facility]) Care Management  [place]  07/21/2017   Brad Mendoza Jan 04, 1956 [id number]  Subjective: Successful outreach to the patient for monthly assessment. HIPAA verified. The patient states that he is doing well.  He denies any pain or falls.   His weight this morning is 210.  His blood sugar has been ranging between 98-140.  He states that he has had a small amount of swelling around his ankles but is monitoring it.  His breathing is fine.  He is monitoring his diet and doing chores around the home.  He states that he had an eye appointment yesterday for some swelling in his left eye and received a shot.  He is being adherent with his medications and has an appointment with his cardiologist in June.    Encounter Medications:  Outpatient Encounter Medications as of 07/21/2017  Medication Sig Note  . albuterol (PROVENTIL HFA;VENTOLIN HFA) 108 (90 Base) MCG/ACT inhaler Inhale 2 puffs into the lungs every 6 (six) hours as needed for wheezing or shortness of breath. 05/17/2017: Takes <1x/week   . aspirin EC 81 MG tablet Take 81 mg by mouth daily.   . Capsaicin (ICY HOT ARTHRITIS THERAPY EX) Apply 1 application topically 2 (two) times daily as needed (back pain).    . carvedilol (COREG) 12.5 MG tablet TAKE ONE TABLET TWICE A DAY WITH FOOD   . Ergocalciferol (VITAMIN D2) 400 units TABS Take 1 tablet by mouth daily.   . fluticasone (FLONASE) 50 MCG/ACT nasal spray USE 2 SPRAYS IN EACH NOSTRIL DAILY   . hydrALAZINE (APRESOLINE) 50 MG tablet Take 1 tablet (50 mg total) by mouth 3 (three) times daily.   . isosorbide mononitrate (IMDUR) 60 MG 24 hr tablet Take 1.5 tablets (90 mg total) by mouth daily.   . metFORMIN (GLUCOPHAGE-XR) 500 MG 24 hr tablet Take 1 tablet (500 mg total) by mouth daily with breakfast. (Patient taking differently: Take 500 mg by mouth daily as needed (for blood sugars). )   . Multiple Vitamins-Minerals (CENTRUM SILVER ADULT 50+ PO) Take 1 tablet by  mouth daily.   . nitroGLYCERIN (NITROSTAT) 0.4 MG SL tablet DISSOLVE 1 TAB UNDER TOUNGE FOR CHEST PAIN. MAY REPEAT EVERY 5 MINUTES FOR 3 DOSES. IF NO RELIEF CALL 911 OR GO TO ER   . Omega-3 Fatty Acids (FISH OIL) 1000 MG CPDR Take 1 capsule by mouth daily.   . pantoprazole (PROTONIX) 40 MG tablet TAKE ONE (1) TABLET EACH DAY   . RANEXA 500 MG 12 hr tablet TAKE ONE TABLET BY MOUTH TWICE DAILY   . rosuvastatin (CRESTOR) 10 MG tablet TAKE ONE (1) TABLET EACH DAY   . sacubitril-valsartan (ENTRESTO) 97-103 MG Take 1 tablet by mouth 2 (two) times daily.   [name] spironolactone (ALDACTONE) 25 MG tablet Take 0.5 tablets (12.5 mg total) by mouth daily.   [name] terbinafine (LAMISIL) 1 % cream Apply 1 application topically 2 (two) times daily. Apply to both feet and between toes   . torsemide (DEMADEX) 20 MG tablet Take 40 mg by mouth daily.   [name] 300 UNIT/ML SOPN Inject 15-40 Units into the skin See admin instructions. Inject 40 units in the morning and 15 units at night   . ULTICARE MINI PEN NEEDLES 31G X 6 MM MISC 4 TIMES DAILY    Facility-Administered Encounter Medications as of 07/21/2017  Medication  . Bevacizumab (AVASTIN) SOLN 1.25 mg    Functional Status:  In your present state of health, do  you have any difficulty performing the following activities: 12/14/2016 09/26/2016  Hearing? N N  Vision? Y N  Difficulty concentrating or making decisions? N N  Walking or climbing stairs? N N  Dressing or bathing? Y N  Doing errands, shopping? Y N  Preparing Food and eating ? N N  Using the Toilet? N N  In the past six months, have you accidently leaked urine? N N  Do you have problems with loss of bowel control? N N  Managing your Medications? N N  Comment - -  Managing your Finances? N N  Housekeeping or managing your Housekeeping? N N  Some recent data might be hidden    Fall/Depression Screening: Fall Risk  07/21/2017 06/09/2017 05/09/2017  Falls in the past year? No Yes No  Comment - 3  weeks ago -  Number falls in past yr: - 1 -  Injury with Fall? - Yes -  Comment - Bruised his hip -  Risk for fall due to : - - -  Follow up - Falls evaluation completed;Education provided -   [facility] 2/9 Scores 12/14/2016 12/02/2016 09/26/2016 09/14/2016 09/01/2016 08/22/2016 07/07/2016  PHQ - 2 Score 1 0 0 0 0 0 0    Assessment: Patient will continue to benefit from health coach outreach for disease management and support.  THN CM Care Plan Problem One     Most Recent Value  THN Long Term Goal   In 90 days the patient will verbalize he has not been seen in the hospital  [place] Term Goal Start Date  07/21/17  Interventions for Problem One Long Term Goal  reinterated to the patient about daily weight monitoring, medication adherence and diet     Plan:  [place] will contact patient in the month of June and patient agrees to next outreach.  [name] RN, BSN, [place] Direct Dial:  [phone]  Fax: [phone]

## 2017-08-15 ENCOUNTER — Other Ambulatory Visit: Payer: Self-pay | Admitting: Pediatrics

## 2017-08-15 NOTE — Progress Notes (Signed)
[facility] Note  08/16/2017     CHIEF COMPLAINT Patient presents for Retina Follow Up   HISTORY OF PRESENT ILLNESS: Brad Mendoza is a 62 y.o. male who presents to the clinic today for:   HPI    Retina Follow Up    Patient presents with  Diabetic Retinopathy.  In both eyes.  This started 3 months ago.  Severity is mild.  Since onset it is gradually improving.  I, the attending physician,  performed the HPI with the patient and updated documentation appropriately.          Comments    F/U NPDR OU. Patient states his vision is "starting to be better, I don't have the blurriness as I use too". BS yesterday(08/15/17) 200's "because I ate bread, usually it is not that high". Denies visual changes due to high Bs. Patient is ready for [place] if indicated.       Last edited by [name], MD on 08/16/2017  9:17 AM. (History)    Pt states he tolerated last injection well; Pt states he feels that injection helped;  Referring physician: [name], MD [place], [place] [address]  HISTORICAL INFORMATION:   Selected notes from the MEDICAL RECORD NUMBER Referred by Dr. [name] for concern of CSME OS;  LEE- 04.04.19 ([name]) [BCVA OD: 20/20 OS: 20/40] Ocular Hx-  PMH- DM, CAD, CHF, HTN, hyperlipidemia    CURRENT MEDICATIONS: No current outpatient medications on file. (Ophthalmic Drugs)   No current facility-administered medications for this visit.  (Ophthalmic Drugs)   Current Outpatient Medications (Other)  Medication Sig  . albuterol (PROVENTIL HFA;VENTOLIN HFA) 108 (90 Base) MCG/ACT inhaler Inhale 2 puffs into the lungs every 6 (six) hours as needed for wheezing or shortness of breath.  [name] aspirin EC 81 MG tablet Take 81 mg by mouth daily.  . Capsaicin (ICY HOT ARTHRITIS THERAPY EX) Apply 1 application topically 2 (two) times daily as needed (back pain).   . carvedilol (COREG) 12.5 MG tablet TAKE ONE TABLET TWICE A DAY WITH FOOD  .  Ergocalciferol (VITAMIN D2) 400 units TABS Take 1 tablet by mouth daily.  . fluticasone (FLONASE) 50 MCG/ACT nasal spray USE 2 SPRAYS IN EACH NOSTRIL DAILY  . hydrALAZINE (APRESOLINE) 50 MG tablet Take 1 tablet (50 mg total) by mouth 3 (three) times daily.  . isosorbide mononitrate (IMDUR) 60 MG 24 hr tablet Take 1.5 tablets (90 mg total) by mouth daily.  . metFORMIN (GLUCOPHAGE-XR) 500 MG 24 hr tablet Take 1 tablet (500 mg total) by mouth daily with breakfast. (Patient taking differently: Take 500 mg by mouth daily as needed (for blood sugars). )  . Multiple Vitamins-Minerals (CENTRUM SILVER ADULT 50+ PO) Take 1 tablet by mouth daily.  . nitroGLYCERIN (NITROSTAT) 0.4 MG SL tablet DISSOLVE 1 TAB UNDER TOUNGE FOR CHEST PAIN. MAY REPEAT EVERY 5 MINUTES FOR 3 DOSES. IF NO RELIEF CALL 911 OR GO TO ER  . Omega-3 Fatty Acids (FISH OIL) 1000 MG CPDR Take 1 capsule by mouth daily.  . pantoprazole (PROTONIX) 40 MG tablet TAKE ONE (1) TABLET EACH DAY  . RANEXA 500 MG 12 hr tablet TAKE ONE TABLET BY MOUTH TWICE DAILY  . rosuvastatin (CRESTOR) 10 MG tablet TAKE ONE (1) TABLET EACH DAY  . sacubitril-valsartan (ENTRESTO) 97-103 MG Take 1 tablet by mouth 2 (two) times daily.  [name] spironolactone (ALDACTONE) 25 MG tablet Take 0.5 tablets (12.5 mg total) by mouth  daily.  . terbinafine (LAMISIL) 1 % cream Apply 1 application topically 2 (two) times daily. Apply to both feet and between toes  . torsemide (DEMADEX) 20 MG tablet Take 40 mg by mouth daily.  [name] 300 UNIT/ML SOPN Inject 15-40 Units into the skin See admin instructions. Inject 40 units in the morning and 15 units at night  . ULTICARE MINI PEN NEEDLES 31G X 6 MM MISC 4 TIMES DAILY   Current Facility-Administered Medications (Other)  Medication Route  . Bevacizumab (AVASTIN) SOLN 1.25 mg Intravitreal  . Bevacizumab (AVASTIN) SOLN 1.25 mg Intravitreal      REVIEW OF SYSTEMS: ROS    Positive for: Endocrine, Eyes   Negative for:  Constitutional, Gastrointestinal, Neurological, Skin, Genitourinary, Musculoskeletal, HENT, Cardiovascular, Respiratory, Psychiatric, Allergic/Imm, Heme/Lymph   Last edited by [name], [name] on 4/48/1856  3:14 AM. (History)       ALLERGIES Allergies  Allergen Reactions  . Sulfa Antibiotics Shortness Of Breath  . Penicillins Rash    Has patient had a PCN reaction causing immediate rash, facial/tongue/throat swelling, SOB or lightheadedness with hypotension: No Has patient had a PCN reaction causing severe rash involving mucus membranes or skin necrosis: No Has patient had a PCN reaction that required hospitalization No Has patient had a PCN reaction occurring within the last 10 years: No If all of the above answers are "NO", then may proceed with Cephalosporin use.     PAST MEDICAL HISTORY Past Medical History:  Diagnosis Date  . Acid reflux   . Back pain    f4 and f5  . CAD (coronary artery disease)   . CHF (congestive heart failure) ([place])    last echo was in March 2016  . Diabetes mellitus 2005  . Diverticulosis   . H/O chest pain 2005   ECHO  . HTN (hypertension)   . Hyperlipidemia   . Nonischemic cardiomyopathy ([place])   . Onychomycosis   . Polysubstance abuse [facility])    Past Surgical History:  Procedure Laterality Date  . APPENDECTOMY    . CARDIAC CATHETERIZATION  2005   4 frech cath  . COLONOSCOPY N/A 08/01/2013   Procedure: COLONOSCOPY;  Surgeon: Rogene Houston, MD;  Location[facility];  Service: Endoscopy;  Laterality: N/A;  rescheduled to [place] notified pt  . ESOPHAGOGASTRODUODENOSCOPY N/A 04/23/2015   Procedure: ESOPHAGOGASTRODUODENOSCOPY (EGD);  Surgeon: Rogene Houston, MD;  Location: [facility];  Service: Endoscopy;  Laterality: N/A;  1:25  . RIGHT/LEFT HEART CATH AND CORONARY ANGIOGRAPHY N/A 06/03/2016   Procedure: Right/Left Heart Cath and Coronary Angiography;  Surgeon: Belva Crome, MD;  Location: [place] CV LAB;  Service:  Cardiovascular;  Laterality: N/A;    FAMILY HISTORY Family History  Problem Relation Age of Onset  . Hypertension Father   . Diabetes Father   . Cancer Mother   . Alcohol abuse Brother   . Colon cancer Neg Hx     SOCIAL HISTORY Social History   Tobacco Use  . Smoking status: Never Smoker  . Smokeless tobacco: Current User    Types: Snuff  . Tobacco comment: dips snuff about 6 cans/week for 3 years  Substance Use Topics  . Alcohol use: Yes    Alcohol/week: 0.6 oz    Types: 1 Cans of beer per week    Comment: occ - has cut down, now once every 2-3 months  . Drug use: Yes    Types: Marijuana    Comment: occasional use -  smoking less         OPHTHALMIC EXAM:  Base Eye Exam    Visual Acuity (Snellen - Linear)      Right Left   Dist cc 20/20 20/50 +3   Dist ph cc NI 20/40 +2   Correction:  Glasses       Tonometry (Tonopen, 8:30 AM)      Right Left   Pressure 16 16       Pupils      Dark Light Shape React APD   Right 4 3 Round Brisk None   Left 4 3 Round Brisk None       Visual [place] (Counting fingers)      Left Right    Full Full       Extraocular Movement      Right Left    Full Full       Neuro/Psych    Oriented x3:  Yes   Mood/Affect:  Normal       Dilation    Both eyes:  1.0% Mydriacyl, Paremyd @ 8:30 AM        Slit Lamp and Fundus Exam    Slit Lamp Exam      Right Left   Lids/Lashes Dermatochalasis - upper lid Dermatochalasis - upper lid   Conjunctiva/Sclera Melanosis Melanosis   Cornea Arcus, trace inferior and central Punctate epithelial erosions Arcus, trace inferior and central Punctate epithelial erosions, round k scar inferonasal para-central   Anterior Chamber Deep and quiet Deep and quiet   Iris Round and dilated, No NVI Round and dilated, No NVI   Lens 2+ Nuclear sclerosis, 2+ Cortical cataract, Vacuoles 2+ Nuclear sclerosis, 2+ Cortical cataract, Vacuoles   Vitreous Vitreous syneresis, prominent vitreous base nasally   Vitreous syneresis, prominent vitreous base nasally       Fundus Exam      Right Left   Disc Normal Normal   C/D Ratio 0.4 0.4   Macula Good foveal reflex, scattered Microaneurysms, DBH, puncate exudates Blunted foveal reflex, +edema -- slightly improved, scattered MAs, scattered DBH, scattered exudates -- improved   Vessels Mild Vascular attenuation Mild Vascular attenuation   Periphery Attached, scattered MAs, few CWS posteriorly Attached, scattered DBH, scattered CWS -- improved          IMAGING AND PROCEDURES  Imaging and Procedures for 05/31/17  OCT, Retina - OU - Both Eyes       Right Eye Quality was good. Central Foveal Thickness: 271. Progression has been stable. Findings include normal foveal contour, no SRF, intraretinal fluid (persistent cystic changes).   Left Eye Quality was good. Central Foveal Thickness: 273. Progression has improved. Findings include normal foveal contour, intraretinal fluid, no SRF (Interval improvement in IRF).   Notes *Images captured and stored on drive  Diagnosis / Impression:  DME OU, OS>OD Persistent cystic changes OD Interval improvement in IRF OS  Clinical management:  See below  Abbreviations: NFP - Normal foveal profile. CME - cystoid macular edema. PED - pigment epithelial detachment. IRF - intraretinal fluid. SRF - subretinal fluid. EZ - ellipsoid zone. ERM - epiretinal membrane. ORA - outer retinal atrophy. ORT - outer retinal tubulation. SRHM - subretinal hyper-reflective material         Intravitreal Injection, Pharmacologic Agent - OS - Left Eye       Time Out 08/16/2017. 9:39 AM. Confirmed correct patient, procedure, site, and patient consented.   Anesthesia Topical anesthesia was used. Anesthetic medications included Tetracaine 0.5%, Lidocaine 2%.  Procedure Preparation included 5% betadine to ocular surface, eyelid speculum. A supplied needle was used.   Injection: 1.25 mg Bevacizumab 1.25mg /0.49ml   NDC:  70360-001-02    Lot: [id number]     Expiration Date: 10/05/2017   Route: Intravitreal   Site: Left Eye   Waste: 0 mg  Post-op Post injection exam found visual acuity of at least counting fingers. The patient tolerated the procedure well. There were no complications. The patient received written and verbal post procedure care education.                 ASSESSMENT/PLAN:    ICD-10-CM   1. Moderate nonproliferative diabetic retinopathy of both eyes with macular edema associated with type 2 diabetes mellitus (HCC) E11.3313 OCT, Retina - OU - Both Eyes    Intravitreal Injection, Pharmacologic Agent - OS - Left Eye    Bevacizumab (AVASTIN) SOLN 1.25 mg  2. Retinal edema H35.81 OCT, Retina - OU - Both Eyes  3. Combined forms of age-related cataract of both eyes H25.813     1, 2. Moderate to severe non-proliferative diabetic retinopathy, OU - initial exam showed scattered IRH, exudates, and cotton wool spots, OS > OD, no NV - FA 4.10.19 shows no NV OU - S/P IVA #1 OS (05.29.19) - OCT shows interval improvement in diabetic macular edema OS, stable (minimal) OD - BCVA mildly improved to 20/40+ from 20/40 OS - recommend IVA #2 OS today, 06.26.19 - pt wishes to proceed - RBA of procedure discussed, questions answered - informed consent obtained and signed - see procedure note - f/u in 4 wks  3. Combined form age-related cataract OU-  - The symptoms of cataract, surgical options, and treatments and risks were discussed with patient. - discussed diagnosis and progression - not yet visually significant - monitor for now   Ophthalmic Meds Ordered this visit:  Meds ordered this encounter  Medications  . Bevacizumab (AVASTIN) SOLN 1.25 mg       Return in about 1 month (around 09/13/2017) for F/U NPDR OU, DFE, OCT.  There are no Patient Instructions on file for this visit.   Explained the diagnoses, plan, and follow up with the patient and they expressed understanding.  Patient  expressed understanding of the importance of proper follow up care.   This document serves as a record of services personally performed by 09/26/2017, MD, PhD. It was created on their behalf by Gardiner Sleeper, [place], a certified ophthalmic assistant. The creation of this record is the provider's dictation and/or activities during the visit.  Electronically signed by: [address], [place]  06.25.19 12:33 PM   07.08.19, M.D., Ph.D. Diseases & Surgery of the Retina and Vitreous [facility]  I have reviewed the above documentation for accuracy and completeness, and I agree with the above. 3[facility], M.D., Ph.D. 08/16/17 12:35 PM     Abbreviations: M myopia (nearsighted); A astigmatism; H hyperopia (farsighted); P presbyopia; Mrx spectacle prescription;  CTL contact lenses; OD right eye; OS left eye; OU both eyes  XT exotropia; ET esotropia; PEK punctate epithelial keratitis; PEE punctate epithelial erosions; DES dry eye syndrome; MGD meibomian gland dysfunction; ATs artificial tears; PFAT's preservative free artificial tears; [place] nuclear sclerotic cataract; PSC posterior subcapsular cataract; ERM epi-retinal membrane; PVD posterior vitreous detachment; RD retinal detachment; DM diabetes mellitus; DR diabetic retinopathy; NPDR non-proliferative diabetic retinopathy; PDR proliferative diabetic retinopathy; CSME clinically significant macular edema; DME diabetic macular edema; dbh dot blot hemorrhages;  CWS cotton wool spot; POAG primary open angle glaucoma; C/D cup-to-disc ratio; HVF humphrey visual field; GVF goldmann visual field; OCT optical coherence tomography; IOP intraocular pressure; BRVO Branch retinal vein occlusion; CRVO central retinal vein occlusion; CRAO central retinal artery occlusion; BRAO branch retinal artery occlusion; RT retinal tear; SB scleral buckle; PPV pars plana vitrectomy; VH Vitreous hemorrhage; PRP panretinal laser photocoagulation; IVK  intravitreal kenalog; VMT vitreomacular traction; MH Macular hole;  NVD neovascularization of the disc; NVE neovascularization elsewhere; AREDS age related eye disease study; ARMD age related macular degeneration; POAG primary open angle glaucoma; EBMD epithelial/anterior basement membrane dystrophy; ACIOL anterior chamber intraocular lens; IOL intraocular lens; PCIOL posterior chamber intraocular lens; Phaco/IOL phacoemulsification with intraocular lens placement; [place] photorefractive keratectomy; LASIK laser assisted in situ keratomileusis; HTN hypertension; DM diabetes mellitus; COPD chronic obstructive pulmonary disease

## 2017-08-16 ENCOUNTER — Ambulatory Visit (INDEPENDENT_AMBULATORY_CARE_PROVIDER_SITE_OTHER): Payer: Medicare Other | Admitting: Ophthalmology

## 2017-08-16 ENCOUNTER — Encounter (INDEPENDENT_AMBULATORY_CARE_PROVIDER_SITE_OTHER): Payer: Self-pay | Admitting: Ophthalmology

## 2017-08-16 DIAGNOSIS — H25813 Combined forms of age-related cataract, bilateral: Secondary | ICD-10-CM

## 2017-08-16 DIAGNOSIS — H3581 Retinal edema: Secondary | ICD-10-CM | POA: Diagnosis not present

## 2017-08-16 DIAGNOSIS — E113313 Type 2 diabetes mellitus with moderate nonproliferative diabetic retinopathy with macular edema, bilateral: Secondary | ICD-10-CM | POA: Diagnosis not present

## 2017-08-16 MED ORDER — BEVACIZUMAB CHEMO INJECTION 1.25MG/0.05ML SYRINGE FOR KALEIDOSCOPE
1.2500 mg | INTRAVITREAL | Status: DC
  Administered 2017-08-16: 1.25 mg via INTRAVITREAL

## 2017-08-17 ENCOUNTER — Other Ambulatory Visit: Payer: Self-pay

## 2017-08-17 NOTE — Patient Outreach (Signed)
[name]) Care Management  [place]  08/17/2017   Brad Mendoza 1955/07/07 [id number]  Subjective: Telephone call to the patient for assessment.  HIPAA verified.  The patient states that he is doing well. He denies any pain or falls.  He has not experienced any shortness of breath swelling or weakness. The patient's blood sugar this morning was 306.  He states that he forgot to take his medications last night.  I discussed some ways to try to help him remember and told him the importance of taking his medications daily.  He verbalized understanding.  The patient states that he does not weigh daily because the floor in his home is uneven.  He states that he weighs 220 lbs.  I discussed trying to find another area around his home that has even flooring to weigh himself.  I advised him of the benefits to his health to be able to weigh daily.  The patient verbalized understanding.  The patient states that he saw Dr. Coralyn Pear yesterday and received a good report on his eyes.  He has a follow up visit next month.  He has an appointment with his cardiologist at the end of this month  Encounter Medications:  Outpatient Encounter Medications as of 08/17/2017  Medication Sig Note  . albuterol (PROVENTIL HFA;VENTOLIN HFA) 108 (90 Base) MCG/ACT inhaler Inhale 2 puffs into the lungs every 6 (six) hours as needed for wheezing or shortness of breath. 05/17/2017: Takes <1x/week   . aspirin EC 81 MG tablet Take 81 mg by mouth daily.   . Capsaicin (ICY HOT ARTHRITIS THERAPY EX) Apply 1 application topically 2 (two) times daily as needed (back pain).    . carvedilol (COREG) 12.5 MG tablet TAKE ONE TABLET TWICE A DAY WITH FOOD   . Ergocalciferol (VITAMIN D2) 400 units TABS Take 1 tablet by mouth daily.   . fluticasone (FLONASE) 50 MCG/ACT nasal spray USE 2 SPRAYS IN EACH NOSTRIL DAILY   . hydrALAZINE (APRESOLINE) 50 MG tablet Take 1 tablet (50 mg total) by mouth 3 (three) times daily.   .  isosorbide mononitrate (IMDUR) 60 MG 24 hr tablet Take 1.5 tablets (90 mg total) by mouth daily.   . metFORMIN (GLUCOPHAGE-XR) 500 MG 24 hr tablet Take 1 tablet (500 mg total) by mouth daily with breakfast. (Patient taking differently: Take 500 mg by mouth daily as needed (for blood sugars). )   . Multiple Vitamins-Minerals (CENTRUM SILVER ADULT 50+ PO) Take 1 tablet by mouth daily.   . nitroGLYCERIN (NITROSTAT) 0.4 MG SL tablet DISSOLVE 1 TAB UNDER TOUNGE FOR CHEST PAIN. MAY REPEAT EVERY 5 MINUTES FOR 3 DOSES. IF NO RELIEF CALL 911 OR GO TO ER   . Omega-3 Fatty Acids (FISH OIL) 1000 MG CPDR Take 1 capsule by mouth daily.   . pantoprazole (PROTONIX) 40 MG tablet TAKE ONE (1) TABLET EACH DAY   . RANEXA 500 MG 12 hr tablet TAKE ONE TABLET BY MOUTH TWICE DAILY   . rosuvastatin (CRESTOR) 10 MG tablet TAKE ONE (1) TABLET EACH DAY   . sacubitril-valsartan (ENTRESTO) 97-103 MG Take 1 tablet by mouth 2 (two) times daily.   [name] spironolactone (ALDACTONE) 25 MG tablet Take 0.5 tablets (12.5 mg total) by mouth daily.   [name] terbinafine (LAMISIL) 1 % cream Apply 1 application topically 2 (two) times daily. Apply to both feet and between toes   . torsemide (DEMADEX) 20 MG tablet Take 40 mg by mouth daily.   [name]  SOLOSTAR 300 UNIT/ML SOPN Inject 15-40 Units into the skin See admin instructions. Inject 40 units in the morning and 15 units at night   . ULTICARE MINI PEN NEEDLES 31G X 6 MM MISC 4 TIMES DAILY    Facility-Administered Encounter Medications as of 08/17/2017  Medication  . Bevacizumab (AVASTIN) SOLN 1.25 mg  . Bevacizumab (AVASTIN) SOLN 1.25 mg    Functional Status:  In your present state of health, do you have any difficulty performing the following activities: 12/14/2016 09/26/2016  Hearing? N N  Vision? Y N  Difficulty concentrating or making decisions? N N  Walking or climbing stairs? N N  Dressing or bathing? Y N  Doing errands, shopping? Y N  Preparing Food and eating ? N N  Using the  Toilet? N N  In the past six months, have you accidently leaked urine? N N  Do you have problems with loss of bowel control? N N  Managing your Medications? N N  Comment - -  Managing your Finances? N N  Housekeeping or managing your Housekeeping? N N  Some recent data might be hidden    Fall/Depression Screening: Fall Risk  08/17/2017 07/21/2017 06/09/2017  Falls in the past year? No No Yes  Comment - - 3 weeks ago  Number falls in past yr: - - 1  Injury with Fall? - - Yes  Comment - - Bruised his hip  Risk for fall due to : - - -  Follow up - - Falls evaluation completed;Education provided   [facility] 2/9 Scores 12/14/2016 12/02/2016 09/26/2016 09/14/2016 09/01/2016 08/22/2016 07/07/2016  PHQ - 2 Score 1 0 0 0 0 0 0    Assessment: Patient will continue to benefit from health coach outreach for disease management and support.  THN CM Care Plan Problem One     Most Recent Value  THN Long Term Goal   In 90 days the patient will verbalize he has not been seen in the hospital  [place] Term Goal Start Date  08/17/17  Interventions for Problem One Long Term Goal  Reviewed signs and symptoms of heart failure, encouraged patient to weigh daily, encouraged medication compliance  THN CM Short Term Goal #1   In 30 days the patient will vebalize that he is eight daily.  THN CM Short Term Goal #1 Start Date  08/17/17  Interventions for Short Term Goal #1  talked with the patient about the importance of weighing on a daily basis.  THN CM Short Term Goal #2   In 30 days the patient will verbalize that he is taking his medications on a daily basis  THN CM Short Term Goal #2 Start Date  08/17/17  Interventions for Short Term Goal #2  discussed with the patient the reasons it is importanct to take his medications daily and some ways to help him remember     Plan:  RN Health Coach will contact patient in the month of July and patient agrees to next outreach.  [name] RN, BSN, [place] Direct Dial:  [phone]  Fax: [phone]

## 2017-09-12 DIAGNOSIS — E1121 Type 2 diabetes mellitus with diabetic nephropathy: Secondary | ICD-10-CM | POA: Diagnosis not present

## 2017-09-12 DIAGNOSIS — R809 Proteinuria, unspecified: Secondary | ICD-10-CM | POA: Diagnosis not present

## 2017-09-12 DIAGNOSIS — E1129 Type 2 diabetes mellitus with other diabetic kidney complication: Secondary | ICD-10-CM | POA: Diagnosis not present

## 2017-09-12 DIAGNOSIS — D649 Anemia, unspecified: Secondary | ICD-10-CM | POA: Diagnosis not present

## 2017-09-12 DIAGNOSIS — N183 Chronic kidney disease, stage 3 (moderate): Secondary | ICD-10-CM | POA: Diagnosis not present

## 2017-09-12 DIAGNOSIS — I10 Essential (primary) hypertension: Secondary | ICD-10-CM | POA: Diagnosis not present

## 2017-09-15 ENCOUNTER — Encounter (INDEPENDENT_AMBULATORY_CARE_PROVIDER_SITE_OTHER): Payer: Medicare Other | Admitting: Ophthalmology

## 2017-09-15 NOTE — Progress Notes (Signed)
[facility] Note  09/18/2017     CHIEF COMPLAINT Patient presents for Retina Follow Up   HISTORY OF PRESENT ILLNESS: Brad Mendoza is a 62 y.o. male who presents to the clinic today for:   HPI    Retina Follow Up    Patient presents with  Diabetic Retinopathy.  In both eyes.  This started 2 months ago.  Severity is mild.  Since onset it is stable.  I, the attending physician,  performed the HPI with the patient and updated documentation appropriately.          Comments    F/U NPDR  OU. Patient states his vision is about the same, denies new visual onsets. " I think I need new glasses" Bs 150"s (couple days ago), BS WINL per patient. Pt is ready for tx if indicated.        Last edited by [name], MD on 09/18/2017  8:42 AM. (History)    Pt states he feels injections are helping;   Referring physician: [name], MD [place], [place] [address]  HISTORICAL INFORMATION:   Selected notes from the MEDICAL RECORD NUMBER Referred by Dr. [name] for concern of CSME OS;  LEE- 04.04.19 ([name]) [BCVA OD: 20/20 OS: 20/40] Ocular Hx-  PMH- DM, CAD, CHF, HTN, hyperlipidemia    CURRENT MEDICATIONS: No current outpatient medications on file. (Ophthalmic Drugs)   No current facility-administered medications for this visit.  (Ophthalmic Drugs)   Current Outpatient Medications (Other)  Medication Sig  . albuterol (PROVENTIL HFA;VENTOLIN HFA) 108 (90 Base) MCG/ACT inhaler Inhale 2 puffs into the lungs every 6 (six) hours as needed for wheezing or shortness of breath.  [name] aspirin EC 81 MG tablet Take 81 mg by mouth daily.  . Capsaicin (ICY HOT ARTHRITIS THERAPY EX) Apply 1 application topically 2 (two) times daily as needed (back pain).   . carvedilol (COREG) 12.5 MG tablet TAKE ONE TABLET TWICE A DAY WITH FOOD  . Ergocalciferol (VITAMIN D2) 400 units TABS Take 1 tablet by mouth daily.  . fluticasone (FLONASE) 50 MCG/ACT nasal spray USE 2  SPRAYS IN EACH NOSTRIL DAILY  . hydrALAZINE (APRESOLINE) 50 MG tablet Take 1 tablet (50 mg total) by mouth 3 (three) times daily.  . isosorbide mononitrate (IMDUR) 60 MG 24 hr tablet Take 1.5 tablets (90 mg total) by mouth daily.  . metFORMIN (GLUCOPHAGE-XR) 500 MG 24 hr tablet Take 1 tablet (500 mg total) by mouth daily with breakfast. (Patient taking differently: Take 500 mg by mouth daily as needed (for blood sugars). )  . Multiple Vitamins-Minerals (CENTRUM SILVER ADULT 50+ PO) Take 1 tablet by mouth daily.  . nitroGLYCERIN (NITROSTAT) 0.4 MG SL tablet DISSOLVE 1 TAB UNDER TOUNGE FOR CHEST PAIN. MAY REPEAT EVERY 5 MINUTES FOR 3 DOSES. IF NO RELIEF CALL 911 OR GO TO ER  . Omega-3 Fatty Acids (FISH OIL) 1000 MG CPDR Take 1 capsule by mouth daily.  . pantoprazole (PROTONIX) 40 MG tablet TAKE ONE (1) TABLET EACH DAY  . RANEXA 500 MG 12 hr tablet TAKE ONE TABLET BY MOUTH TWICE DAILY  . rosuvastatin (CRESTOR) 10 MG tablet TAKE ONE (1) TABLET EACH DAY  . sacubitril-valsartan (ENTRESTO) 97-103 MG Take 1 tablet by mouth 2 (two) times daily.  [name] spironolactone (ALDACTONE) 25 MG tablet Take 0.5 tablets (12.5 mg total) by mouth daily.  [name] terbinafine (LAMISIL) 1 % cream Apply 1 application topically 2 (two) times  daily. Apply to both feet and between toes  . torsemide (DEMADEX) 20 MG tablet Take 40 mg by mouth daily.  [name] 300 UNIT/ML SOPN Inject 15-40 Units into the skin See admin instructions. Inject 40 units in the morning and 15 units at night  . ULTICARE MINI PEN NEEDLES 31G X 6 MM MISC 4 TIMES DAILY   Current Facility-Administered Medications (Other)  Medication Route  . Bevacizumab (AVASTIN) SOLN 1.25 mg Intravitreal  . Bevacizumab (AVASTIN) SOLN 1.25 mg Intravitreal  . Bevacizumab (AVASTIN) SOLN 1.25 mg Intravitreal      REVIEW OF SYSTEMS: ROS    Positive for: Endocrine, Eyes   Negative for: Constitutional, Gastrointestinal, Neurological, Skin, Genitourinary, Musculoskeletal,  HENT, Cardiovascular, Respiratory, Psychiatric, Allergic/Imm, Heme/Lymph   Last edited by [name], [name] on 6/54/6503  5:46 AM. (History)       ALLERGIES Allergies  Allergen Reactions  . Sulfa Antibiotics Shortness Of Breath  . Penicillins Rash    Has patient had a PCN reaction causing immediate rash, facial/tongue/throat swelling, SOB or lightheadedness with hypotension: No Has patient had a PCN reaction causing severe rash involving mucus membranes or skin necrosis: No Has patient had a PCN reaction that required hospitalization No Has patient had a PCN reaction occurring within the last 10 years: No If all of the above answers are "NO", then may proceed with Cephalosporin use.     PAST MEDICAL HISTORY Past Medical History:  Diagnosis Date  . Acid reflux   . Back pain    f4 and f5  . CAD (coronary artery disease)   . CHF (congestive heart failure) ([place])    last echo was in March 2016  . Diabetes mellitus 2005  . Diverticulosis   . H/O chest pain 2005   ECHO  . HTN (hypertension)   . Hyperlipidemia   . Nonischemic cardiomyopathy ([place])   . Onychomycosis   . Polysubstance abuse [facility])    Past Surgical History:  Procedure Laterality Date  . APPENDECTOMY    . CARDIAC CATHETERIZATION  2005   4 frech cath  . COLONOSCOPY N/A 08/01/2013   Procedure: COLONOSCOPY;  Surgeon: Rogene Houston, MD;  [facility];  Service: Endoscopy;  Laterality: N/A;  rescheduled to [place] notified pt  . ESOPHAGOGASTRODUODENOSCOPY N/A 04/23/2015   Procedure: ESOPHAGOGASTRODUODENOSCOPY (EGD);  Surgeon: Rogene Houston, MD;  Location: [facility];  Service: Endoscopy;  Laterality: N/A;  1:25  . RIGHT/LEFT HEART CATH AND CORONARY ANGIOGRAPHY N/A 06/03/2016   Procedure: Right/Left Heart Cath and Coronary Angiography;  Surgeon: Belva Crome, MD;  Location: [facility];  Service: Cardiovascular;  Laterality: N/A;    FAMILY HISTORY Family History  Problem Relation Age of  Onset  . Hypertension Father   . Diabetes Father   . Cancer Mother   . Alcohol abuse Brother   . Colon cancer Neg Hx     SOCIAL HISTORY Social History   Tobacco Use  . Smoking status: Never Smoker  . Smokeless tobacco: Current User    Types: Snuff  . Tobacco comment: dips snuff about 6 cans/week for 3 years  Substance Use Topics  . Alcohol use: Yes    Alcohol/week: 0.6 oz    Types: 1 Cans of beer per week    Comment: occ - has cut down, now once every 2-3 months  . Drug use: Yes    Types: Marijuana    Comment: occasional use - smoking less  OPHTHALMIC EXAM:  Base Eye Exam    Visual Acuity (Snellen - Linear)      Right Left   Dist cc 20/20 20/50   Dist ph cc  NI   Correction:  Glasses       Tonometry (Tonopen, 8:14 AM)      Right Left   Pressure 15 16       Pupils      Dark Light Shape React APD   Right 4 3 Round Brisk None   Left 4 3 Round Brisk None       Visual Sledd (Counting fingers)      Left Right    Full Full       Extraocular Movement      Right Left    Full, Ortho Full, Ortho       Neuro/Psych    Oriented x3:  Yes   Mood/Affect:  Normal       Dilation    Both eyes:  1.0% Mydriacyl, 2.5% Phenylephrine @ 8:14 AM        Slit Lamp and Fundus Exam    Slit Lamp Exam      Right Left   Lids/Lashes Dermatochalasis - upper lid Dermatochalasis - upper lid   Conjunctiva/Sclera Melanosis Melanosis   Cornea Arcus, trace inferior and central Punctate epithelial erosions Arcus, trace inferior and central Punctate epithelial erosions, round k scar inferonasal para-central   Anterior Chamber Deep and quiet Deep and quiet   Iris Round and dilated, No NVI Round and dilated, No NVI   Lens 2+ Nuclear sclerosis, 2+ Cortical cataract, Vacuoles 2+ Nuclear sclerosis, 2+ Cortical cataract, Vacuoles   Vitreous Vitreous syneresis, prominent vitreous base nasally  Vitreous syneresis, prominent vitreous base nasally       Fundus Exam      Right  Left   Disc Normal Normal   C/D Ratio 0.4 0.4   Macula Good foveal reflex, scattered Microaneurysms, DBH, puncate exudates Blunted foveal reflex, +edema -- slightly improved, scattered MAs, scattered DBH, scattered exudates -- improved   Vessels Mild Vascular attenuation Mild Vascular attenuation   Periphery Attached, scattered MAs, few CWS posteriorly -- improved Attached, scattered DBH, scattered CWS -- improved          IMAGING AND PROCEDURES  Imaging and Procedures for 05/31/17  OCT, Retina - OU - Both Eyes       Right Eye Quality was good. Central Foveal Thickness: 272. Progression has been stable. Findings include normal foveal contour, no SRF, intraretinal fluid (Slight interval improvement in IRF/cystic changes).   Left Eye Quality was good. Central Foveal Thickness: 266. Progression has been stable. Findings include normal foveal contour, intraretinal fluid, no SRF (Trace Interval improvement in IRF).   Notes *Images captured and stored on drive  Diagnosis / Impression:  DME OU, OS>OD Slight interval improvement in IRF / cystic changes OD Trace Interval improvement in IRF OS  Clinical management:  See below  Abbreviations: NFP - Normal foveal profile. CME - cystoid macular edema. PED - pigment epithelial detachment. IRF - intraretinal fluid. SRF - subretinal fluid. EZ - ellipsoid zone. ERM - epiretinal membrane. ORA - outer retinal atrophy. ORT - outer retinal tubulation. SRHM - subretinal hyper-reflective material         Intravitreal Injection, Pharmacologic Agent - OS - Left Eye       Time Out 09/18/2017. 8:58 AM. Confirmed correct patient, procedure, site, and patient consented.   Anesthesia Topical anesthesia was used. Anesthetic medications included Tetracaine  0.5%, Lidocaine 2%.   Procedure Preparation included 5% betadine to ocular surface, eyelid speculum. A 30 gauge needle was used.   Injection: 1.25 mg Bevacizumab 1.25mg /0.7ml   NDC:  03009-233-00    Lot: [id number]     Expiration Date: 11/23/2017   Route: Intravitreal   Site: Left Eye   Waste: 0 mg  Post-op Post injection exam found visual acuity of at least counting fingers. The patient tolerated the procedure well. There were no complications. The patient received written and verbal post procedure care education.                 ASSESSMENT/PLAN:    ICD-10-CM   1. Moderate nonproliferative diabetic retinopathy of both eyes with macular edema associated with type 2 diabetes mellitus (HCC) 23/04/2017 Intravitreal Injection, Pharmacologic Agent - OS - Left Eye    Bevacizumab (AVASTIN) SOLN 1.25 mg  2. Retinal edema H35.81 OCT, Retina - OU - Both Eyes  3. Combined forms of age-related cataract of both eyes H25.813     1,2. Moderate to severe non-proliferative diabetic retinopathy, OU - initial exam showed scattered IRH, exudates, and cotton wool spots, OS > OD, no NV - FA 4.10.19 shows no NV OU - S/P IVA #1 OS (05.29.19), #2 (06.26.19) - OCT shows trace interval improvement in diabetic macular edema OS, stable (minimal) OD - BCVA remains 20/40 OS - recommend IVA #3 OS today (07.29.19) - pt wishes to proceed - RBA of procedure discussed, questions answered - informed consent obtained and signed - see procedure note - discussed possibility of switching therapy if edema does not improve significantly - Eylea4U paper work and benefits investigation started on 07.29.19 - f/u in 4 weeks  3. Combined form age-related cataract OU-  - The symptoms of cataract, surgical options, and treatments and risks were discussed with patient. - discussed diagnosis and progression - not yet visually significant - monitor for now   Ophthalmic Meds Ordered this visit:  Meds ordered this encounter  Medications  . Bevacizumab (AVASTIN) SOLN 1.25 mg       Return in about 1 month (around 10/16/2017) for F/U NPDR OU, DFE, OCT, Possible Injxn.  There are no Patient  Instructions on file for this visit.   Explained the diagnoses, plan, and follow up with the patient and they expressed understanding.  Patient expressed understanding of the importance of proper follow up care.   This document serves as a record of services personally performed by 10/29/2017, MD, PhD. It was created on their behalf by Gardiner Sleeper, OA, an ophthalmic assistant. The creation of this record is the provider's dictation and/or activities during the visit.    Electronically signed by: Ernest Mallick, OA  07.26.2019 10:26 AM   This document serves as a record of services personally performed by 08.08.2019, MD, PhD. It was created on their behalf by Gardiner Sleeper, [place], a certified ophthalmic assistant. The creation of this record is the provider's dictation and/or activities during the visit.  Electronically signed by: [address], [place]  07.29.19 10:26 AM   08.11.19, M.D., Ph.D. Diseases & Surgery of the Retina and Vitreous [facility]  I have reviewed the above documentation for accuracy and completeness, and I agree with the above. 3[facility], M.D., Ph.D. 09/18/17 10:26 AM   Abbreviations: M myopia (nearsighted); A astigmatism; H hyperopia (farsighted); P presbyopia; Mrx spectacle prescription;  CTL contact lenses; OD right eye; OS left eye; OU both eyes  XT exotropia; ET esotropia; PEK punctate epithelial keratitis; PEE punctate epithelial erosions; DES dry eye syndrome; MGD meibomian gland dysfunction; ATs artificial tears; PFAT's preservative free artificial tears; [place] nuclear sclerotic cataract; PSC posterior subcapsular cataract; ERM epi-retinal membrane; PVD posterior vitreous detachment; RD retinal detachment; DM diabetes mellitus; DR diabetic retinopathy; NPDR non-proliferative diabetic retinopathy; PDR proliferative diabetic retinopathy; CSME clinically significant macular edema; DME diabetic macular edema; dbh dot blot  hemorrhages; CWS cotton wool spot; POAG primary open angle glaucoma; C/D cup-to-disc ratio; HVF humphrey visual field; GVF goldmann visual field; OCT optical coherence tomography; IOP intraocular pressure; BRVO Branch retinal vein occlusion; CRVO central retinal vein occlusion; CRAO central retinal artery occlusion; BRAO branch retinal artery occlusion; RT retinal tear; SB scleral buckle; PPV pars plana vitrectomy; VH Vitreous hemorrhage; PRP panretinal laser photocoagulation; IVK intravitreal kenalog; VMT vitreomacular traction; MH Macular hole;  NVD neovascularization of the disc; NVE neovascularization elsewhere; AREDS age related eye disease study; ARMD age related macular degeneration; POAG primary open angle glaucoma; EBMD epithelial/anterior basement membrane dystrophy; ACIOL anterior chamber intraocular lens; IOL intraocular lens; PCIOL posterior chamber intraocular lens; Phaco/IOL phacoemulsification with intraocular lens placement; [place] photorefractive keratectomy; LASIK laser assisted in situ keratomileusis; HTN hypertension; DM diabetes mellitus; COPD chronic obstructive pulmonary disease

## 2017-09-18 ENCOUNTER — Other Ambulatory Visit: Payer: Self-pay

## 2017-09-18 ENCOUNTER — Encounter (INDEPENDENT_AMBULATORY_CARE_PROVIDER_SITE_OTHER): Payer: Self-pay | Admitting: Ophthalmology

## 2017-09-18 ENCOUNTER — Ambulatory Visit (INDEPENDENT_AMBULATORY_CARE_PROVIDER_SITE_OTHER): Payer: Medicare Other | Admitting: Ophthalmology

## 2017-09-18 DIAGNOSIS — E113313 Type 2 diabetes mellitus with moderate nonproliferative diabetic retinopathy with macular edema, bilateral: Secondary | ICD-10-CM

## 2017-09-18 DIAGNOSIS — H3581 Retinal edema: Secondary | ICD-10-CM | POA: Diagnosis not present

## 2017-09-18 DIAGNOSIS — H25813 Combined forms of age-related cataract, bilateral: Secondary | ICD-10-CM

## 2017-09-18 MED ORDER — BEVACIZUMAB CHEMO INJECTION 1.25MG/0.05ML SYRINGE FOR KALEIDOSCOPE
1.2500 mg | INTRAVITREAL | Status: DC
  Administered 2017-09-18: 1.25 mg via INTRAVITREAL

## 2017-09-18 NOTE — Patient Outreach (Addendum)
[facility]) Care Management  09/18/2017   Brad Mendoza 07-21-1955 [id number]  Subjective: Telephone call to the patient for assessment. HIPAA verified.  The patient was leaving his appointment at the eye doctor to have an injection in his eye.  He states that it is going well.  He denies any falls, pain, swelling or cough.  He did not weigh today but states that he stays around 210.  Discussed the importance of weighing daily and reviewed the signs and symptoms of CHF.  The patient states that he is being adherent with his medication.  His blood sugars are staying around 150.  He is being cognizant of his food intake watching the salt and eating more fruits and vegetables. He was unable to tell me when his next appointment is with his cardiologist.  He states that he has it written down at home and if not he will call to get the appointment date and time.    Current Medications:  Current Outpatient Medications  Medication Sig Dispense Refill  . albuterol (PROVENTIL HFA;VENTOLIN HFA) 108 (90 Base) MCG/ACT inhaler Inhale 2 puffs into the lungs every 6 (six) hours as needed for wheezing or shortness of breath. 1 Inhaler 2  . aspirin EC 81 MG tablet Take 81 mg by mouth daily.    . Capsaicin (ICY HOT ARTHRITIS THERAPY EX) Apply 1 application topically 2 (two) times daily as needed (back pain).     . carvedilol (COREG) 12.5 MG tablet TAKE ONE TABLET TWICE A DAY WITH FOOD 180 tablet 1  . Ergocalciferol (VITAMIN D2) 400 units TABS Take 1 tablet by mouth daily.    . fluticasone (FLONASE) 50 MCG/ACT nasal spray USE 2 SPRAYS IN EACH NOSTRIL DAILY 48 g 1  . hydrALAZINE (APRESOLINE) 50 MG tablet Take 1 tablet (50 mg total) by mouth 3 (three) times daily. 90 tablet 11  . isosorbide mononitrate (IMDUR) 60 MG 24 hr tablet Take 1.5 tablets (90 mg total) by mouth daily. 135 tablet 1  . metFORMIN (GLUCOPHAGE-XR) 500 MG 24 hr tablet Take 1 tablet (500 mg total) by mouth daily with breakfast.  (Patient taking differently: Take 500 mg by mouth daily as needed (for blood sugars). ) 90 tablet 1  . Multiple Vitamins-Minerals (CENTRUM SILVER ADULT 50+ PO) Take 1 tablet by mouth daily.    . nitroGLYCERIN (NITROSTAT) 0.4 MG SL tablet DISSOLVE 1 TAB UNDER TOUNGE FOR CHEST PAIN. MAY REPEAT EVERY 5 MINUTES FOR 3 DOSES. IF NO RELIEF CALL 911 OR GO TO ER 25 tablet 2  . Omega-3 Fatty Acids (FISH OIL) 1000 MG CPDR Take 1 capsule by mouth daily.    . pantoprazole (PROTONIX) 40 MG tablet TAKE ONE (1) TABLET EACH DAY 90 tablet 1  . RANEXA 500 MG 12 hr tablet TAKE ONE TABLET BY MOUTH TWICE DAILY 60 tablet 6  . rosuvastatin (CRESTOR) 10 MG tablet TAKE ONE (1) TABLET EACH DAY 90 tablet 1  . sacubitril-valsartan (ENTRESTO) 97-103 MG Take 1 tablet by mouth 2 (two) times daily. 180 tablet 3  . spironolactone (ALDACTONE) 25 MG tablet Take 0.5 tablets (12.5 mg total) by mouth daily. 45 tablet 3  . terbinafine (LAMISIL) 1 % cream Apply 1 application topically 2 (two) times daily. Apply to both feet and between toes 30 g 1  . torsemide (DEMADEX) 20 MG tablet Take 40 mg by mouth daily.    [name] 300 UNIT/ML SOPN Inject 15-40 Units into the skin See admin instructions. Inject 40  units in the morning and 15 units at night    . ULTICARE MINI PEN NEEDLES 31G X 6 MM MISC 4 TIMES DAILY 100 each 2   Current Facility-Administered Medications  Medication Dose Route Frequency Provider Last Rate Last Dose  . Bevacizumab (AVASTIN) SOLN 1.25 mg  1.25 mg Intravitreal  [name], MD   1.25 mg at 07/19/17 0849  . Bevacizumab (AVASTIN) SOLN 1.25 mg  1.25 mg Intravitreal  [name], MD   1.25 mg at 08/16/17 1232  . Bevacizumab (AVASTIN) SOLN 1.25 mg  1.25 mg Intravitreal  [name], MD   1.25 mg at 09/18/17 1024    Functional Status:  In your present state of health, do you have any difficulty performing the following activities: 12/14/2016 09/26/2016  Hearing? N N  Vision? Y N  Difficulty concentrating  or making decisions? N N  Walking or climbing stairs? N N  Dressing or bathing? Y N  Doing errands, shopping? Y N  Preparing Food and eating ? N N  Using the Toilet? N N  In the past six months, have you accidently leaked urine? N N  Do you have problems with loss of bowel control? N N  Managing your Medications? N N  Managing your Finances? N N  Housekeeping or managing your Housekeeping? N N  Some recent data might be hidden    Fall/Depression Screening: Fall Risk  09/18/2017 08/17/2017 07/21/2017  Falls in the past year? No No No  Comment - - -  Number falls in past yr: - - -  Injury with Fall? - - -  Comment - - -  Risk for fall due to : - - -  Follow up - - -   PHQ 2/9 Scores 12/14/2016 12/02/2016 09/26/2016 09/14/2016 09/01/2016 08/22/2016 07/07/2016  PHQ - 2 Score 1 0 0 0 0 0 0    Assessment: Patient will continue to benefit from health coach outreach for disease management and support.  THN CM Care Plan Problem One     Most Recent Value  Care Plan Problem One  Knowldege deficit for diease mangement of CHF  THN Long Term Goal   In 90 days the patient will verbalize he has not been seen in the hospital  [place] Term Goal Start Date  09/18/17  Interventions for Problem One Long Term Goal  signs and symptoms of hear failure , weight, diet and medication compliance reviewed  THN CM Short Term Goal #1   In 30 days the patient will verbalize weighing daily  THN CM Short Term Goal #1 Start Date  09/18/17  Interventions for Short Term Goal #1  discussed the importance of weighing daily     Plan: RN Health Coach will contact patient in the month of August and patient agrees to next outreach.  [name] RN, BSN, [place] Direct Dial:  [phone]  Fax: [phone]

## 2017-09-19 ENCOUNTER — Other Ambulatory Visit: Payer: Self-pay | Admitting: Pediatrics

## 2017-09-19 DIAGNOSIS — E7800 Pure hypercholesterolemia, unspecified: Secondary | ICD-10-CM

## 2017-09-19 DIAGNOSIS — K219 Gastro-esophageal reflux disease without esophagitis: Secondary | ICD-10-CM

## 2017-10-11 ENCOUNTER — Other Ambulatory Visit: Payer: Self-pay

## 2017-10-11 NOTE — Patient Outreach (Signed)
[facility]) Care Management  10/11/2017  Brad Mendoza 25-Aug-1955 [id number]    1st outreach to the patient for assessment. No answer. Unable to leave a voicemail due to it has not been set up.  Plan: RN Health Coach will send letter. RN Health Coach will make another attempt to the patient within four business days.   [name] RN, BSN, [place] Direct Dial:  [phone]  Fax: [phone]

## 2017-10-16 ENCOUNTER — Encounter (INDEPENDENT_AMBULATORY_CARE_PROVIDER_SITE_OTHER): Payer: Medicare Other | Admitting: Ophthalmology

## 2017-10-17 ENCOUNTER — Other Ambulatory Visit: Payer: Self-pay

## 2017-10-17 NOTE — Patient Outreach (Signed)
[facility]) Care Management  10/17/2017   Brad Mendoza Sep 14, 1955 [id number]  Subjective: Telephone call to the patient for assessment. HIPAA verified.  The patient states that he is doing well. He denies any chest pain, swelling or falls.  The patient was unable to weigh today because he states that his floor is uneven. I encouraged the patient to try a place in the home that will allow him to weigh daily.  He states that he is adherent with his medications.  He states that he has an appointment with his cardiologist next month 9/17.  The patient has requested that I continue to call him monthly.     Current Medications:  Current Outpatient Medications  Medication Sig Dispense Refill  . albuterol (PROVENTIL HFA;VENTOLIN HFA) 108 (90 Base) MCG/ACT inhaler Inhale 2 puffs into the lungs every 6 (six) hours as needed for wheezing or shortness of breath. 1 Inhaler 2  . aspirin EC 81 MG tablet Take 81 mg by mouth daily.    . Capsaicin (ICY HOT ARTHRITIS THERAPY EX) Apply 1 application topically 2 (two) times daily as needed (back pain).     . carvedilol (COREG) 12.5 MG tablet TAKE ONE TABLET TWICE A DAY WITH FOOD 180 tablet 1  . Ergocalciferol (VITAMIN D2) 400 units TABS Take 1 tablet by mouth daily.    . fluticasone (FLONASE) 50 MCG/ACT nasal spray USE 2 SPRAYS IN EACH NOSTRIL DAILY 48 g 1  . hydrALAZINE (APRESOLINE) 50 MG tablet Take 1 tablet (50 mg total) by mouth 3 (three) times daily. 90 tablet 11  . isosorbide mononitrate (IMDUR) 60 MG 24 hr tablet Take 1.5 tablets (90 mg total) by mouth daily. 135 tablet 1  . metFORMIN (GLUCOPHAGE-XR) 500 MG 24 hr tablet Take 1 tablet (500 mg total) by mouth daily with breakfast. (Patient taking differently: Take 500 mg by mouth daily as needed (for blood sugars). ) 90 tablet 1  . Multiple Vitamins-Minerals (CENTRUM SILVER ADULT 50+ PO) Take 1 tablet by mouth daily.    . nitroGLYCERIN (NITROSTAT) 0.4 MG SL tablet DISSOLVE 1 TAB UNDER  TOUNGE FOR CHEST PAIN. MAY REPEAT EVERY 5 MINUTES FOR 3 DOSES. IF NO RELIEF CALL 911 OR GO TO ER 25 tablet 2  . Omega-3 Fatty Acids (FISH OIL) 1000 MG CPDR Take 1 capsule by mouth daily.    . pantoprazole (PROTONIX) 40 MG tablet TAKE ONE (1) TABLET EACH DAY 90 tablet 1  . RANEXA 500 MG 12 hr tablet TAKE ONE TABLET BY MOUTH TWICE DAILY 60 tablet 6  . rosuvastatin (CRESTOR) 10 MG tablet TAKE ONE (1) TABLET EACH DAY 90 tablet 1  . sacubitril-valsartan (ENTRESTO) 97-103 MG Take 1 tablet by mouth 2 (two) times daily. 180 tablet 3  . spironolactone (ALDACTONE) 25 MG tablet Take 0.5 tablets (12.5 mg total) by mouth daily. 45 tablet 3  . terbinafine (LAMISIL) 1 % cream Apply 1 application topically 2 (two) times daily. Apply to both feet and between toes 30 g 1  . torsemide (DEMADEX) 20 MG tablet Take 40 mg by mouth daily.    [name] 300 UNIT/ML SOPN Inject 15-40 Units into the skin See admin instructions. Inject 40 units in the morning and 15 units at night    . ULTICARE MINI PEN NEEDLES 31G X 6 MM MISC 4 TIMES DAILY 100 each 2   Current Facility-Administered Medications  Medication Dose Route Frequency Provider Last Rate Last Dose  . Bevacizumab (AVASTIN) SOLN 1.25 mg  1.25 mg Intravitreal  [name], MD   1.25 mg at 07/19/17 0849  . Bevacizumab (AVASTIN) SOLN 1.25 mg  1.25 mg Intravitreal  [name], MD   1.25 mg at 08/16/17 1232  . Bevacizumab (AVASTIN) SOLN 1.25 mg  1.25 mg Intravitreal  [name], MD   1.25 mg at 09/18/17 1024    Functional Status:  In your present state of health, do you have any difficulty performing the following activities: 12/14/2016  Hearing? N  Vision? Y  Difficulty concentrating or making decisions? N  Walking or climbing stairs? N  Dressing or bathing? Y  Doing errands, shopping? Y  Preparing Food and eating ? N  Using the Toilet? N  In the past six months, have you accidently leaked urine? N  Do you have problems with loss of bowel control?  N  Managing your Medications? N  Managing your Finances? N  Housekeeping or managing your Housekeeping? N  Some recent data might be hidden    Fall/Depression Screening: Fall Risk  10/17/2017 09/18/2017 08/17/2017  Falls in the past year? No No No  Comment - - -  Number falls in past yr: - - -  Injury with Fall? - - -  Comment - - -  Risk for fall due to : - - -  Follow up - - -   PHQ 2/9 Scores 12/14/2016 12/02/2016 09/26/2016 09/14/2016 09/01/2016 08/22/2016 07/07/2016  PHQ - 2 Score 1 0 0 0 0 0 0    Assessment: Patient will continue to benefit from health coach outreach for disease management and support.  THN CM Care Plan Problem One     Most Recent Value  THN Long Term Goal   In 90 days the patient will verbalize he has not been seen in the hospital  [place] Term Goal Start Date  10/17/17  Interventions for Problem One Long Term Goal  reviewed signs and sypmtoms of heart failure, heart failure action plan red zone and encouraged to weigh daily.     Plan: RN Health Coach will contact patient in the month of September and patient agrees to next outreach.  [name] RN, BSN, [place] Direct Dial:  [phone]  Fax: [phone]

## 2017-10-19 NOTE — Progress Notes (Signed)
[facility] Note  10/20/2017     CHIEF COMPLAINT Patient presents for Retina Follow Up   HISTORY OF PRESENT ILLNESS: Brad Mendoza is a 62 y.o. male who presents to the clinic today for:   HPI    Retina Follow Up    Patient presents with  Diabetic Retinopathy.  Severity is moderate.  Duration of 1 month.  Since onset it is stable.  I, the attending physician,  performed the HPI with the patient and updated documentation appropriately.          Comments    Pt presents for NPDR OU f/u, pt states VA is the same as last time, pt denies flashes, floaters, and wavy vision, but states OS gets sore sometimes, pt denies the use of gtts, pt states he is tolerating injections well and is ready for another one today        Last edited by [name], MD on 10/20/2017  8:58 AM. (History)       Referring physician: [name], MD [place],  [id number]  HISTORICAL INFORMATION:   Selected notes from the MEDICAL RECORD NUMBER Referred by Dr. [name] for concern of CSME OS;  LEE- 04.04.19 ([name]) [BCVA OD: 20/20 OS: 20/40] Ocular Hx-  PMH- DM, CAD, CHF, HTN, hyperlipidemia    CURRENT MEDICATIONS: No current outpatient medications on file. (Ophthalmic Drugs)   Current Facility-Administered Medications (Ophthalmic Drugs)  Medication Route  . aflibercept (EYLEA) SOLN 2 mg Intravitreal   Current Outpatient Medications (Other)  Medication Sig  . albuterol (PROVENTIL HFA;VENTOLIN HFA) 108 (90 Base) MCG/ACT inhaler Inhale 2 puffs into the lungs every 6 (six) hours as needed for wheezing or shortness of breath.  [name] aspirin EC 81 MG tablet Take 81 mg by mouth daily.  . Capsaicin (ICY HOT ARTHRITIS THERAPY EX) Apply 1 application topically 2 (two) times daily as needed (back pain).   . carvedilol (COREG) 12.5 MG tablet TAKE ONE TABLET TWICE A DAY WITH FOOD  . Ergocalciferol (VITAMIN D2) 400 units TABS Take 1 tablet by mouth daily.  .  fluticasone (FLONASE) 50 MCG/ACT nasal spray USE 2 SPRAYS IN EACH NOSTRIL DAILY  . hydrALAZINE (APRESOLINE) 50 MG tablet Take 1 tablet (50 mg total) by mouth 3 (three) times daily.  . isosorbide mononitrate (IMDUR) 60 MG 24 hr tablet Take 1.5 tablets (90 mg total) by mouth daily.  . metFORMIN (GLUCOPHAGE-XR) 500 MG 24 hr tablet Take 1 tablet (500 mg total) by mouth daily with breakfast. (Patient taking differently: Take 500 mg by mouth daily as needed (for blood sugars). )  . Multiple Vitamins-Minerals (CENTRUM SILVER ADULT 50+ PO) Take 1 tablet by mouth daily.  . nitroGLYCERIN (NITROSTAT) 0.4 MG SL tablet DISSOLVE 1 TAB UNDER TOUNGE FOR CHEST PAIN. MAY REPEAT EVERY 5 MINUTES FOR 3 DOSES. IF NO RELIEF CALL 911 OR GO TO ER  . Omega-3 Fatty Acids (FISH OIL) 1000 MG CPDR Take 1 capsule by mouth daily.  . pantoprazole (PROTONIX) 40 MG tablet TAKE ONE (1) TABLET EACH DAY  . RANEXA 500 MG 12 hr tablet TAKE ONE TABLET BY MOUTH TWICE DAILY  . rosuvastatin (CRESTOR) 10 MG tablet TAKE ONE (1) TABLET EACH DAY  . sacubitril-valsartan (ENTRESTO) 97-103 MG Take 1 tablet by mouth 2 (two) times daily.  [name] spironolactone (ALDACTONE) 25 MG tablet Take 0.5 tablets (12.5 mg total) by mouth daily.  [name] terbinafine (LAMISIL) 1 % cream Apply 1 application  topically 2 (two) times daily. Apply to both feet and between toes  . torsemide (DEMADEX) 20 MG tablet Take 40 mg by mouth daily.  [name] 300 UNIT/ML SOPN Inject 15-40 Units into the skin See admin instructions. Inject 40 units in the morning and 15 units at night  . ULTICARE MINI PEN NEEDLES 31G X 6 MM MISC 4 TIMES DAILY   Current Facility-Administered Medications (Other)  Medication Route  . Bevacizumab (AVASTIN) SOLN 1.25 mg Intravitreal  . Bevacizumab (AVASTIN) SOLN 1.25 mg Intravitreal  . Bevacizumab (AVASTIN) SOLN 1.25 mg Intravitreal      REVIEW OF SYSTEMS: ROS    Positive for: Cardiovascular, Eyes   Negative for: Constitutional,  Gastrointestinal, Neurological, Skin, Genitourinary, Musculoskeletal, HENT, Endocrine, Respiratory, Psychiatric, Allergic/Imm, Heme/Lymph   Last edited by Debbrah Alar, COT on 10/20/2017  8:07 AM. (History)       ALLERGIES Allergies  Allergen Reactions  . Sulfa Antibiotics Shortness Of Breath  . Penicillins Rash    Has patient had a PCN reaction causing immediate rash, facial/tongue/throat swelling, SOB or lightheadedness with hypotension: No Has patient had a PCN reaction causing severe rash involving mucus membranes or skin necrosis: No Has patient had a PCN reaction that required hospitalization No Has patient had a PCN reaction occurring within the last 10 years: No If all of the above answers are "NO", then may proceed with Cephalosporin use.     PAST MEDICAL HISTORY Past Medical History:  Diagnosis Date  . Acid reflux   . Back pain    f4 and f5  . CAD (coronary artery disease)   . CHF (congestive heart failure) ([place])    last echo was in March 2016  . Diabetes mellitus 2005  . Diverticulosis   . H/O chest pain 2005   ECHO  . HTN (hypertension)   . Hyperlipidemia   . Nonischemic cardiomyopathy ([place])   . Onychomycosis   . Polysubstance abuse [facility])    Past Surgical History:  Procedure Laterality Date  . APPENDECTOMY    . CARDIAC CATHETERIZATION  2005   4 frech cath  . COLONOSCOPY N/A 08/01/2013   Procedure: COLONOSCOPY;  Surgeon: Rogene Houston, MD;  Location[facility];  Service: Endoscopy;  Laterality: N/A;  rescheduled to [place] notified pt  . ESOPHAGOGASTRODUODENOSCOPY N/A 04/23/2015   Procedure: ESOPHAGOGASTRODUODENOSCOPY (EGD);  Surgeon: Rogene Houston, MD;  Location: [facility];  Service: Endoscopy;  Laterality: N/A;  1:25  . RIGHT/LEFT HEART CATH AND CORONARY ANGIOGRAPHY N/A 06/03/2016   Procedure: Right/Left Heart Cath and Coronary Angiography;  Surgeon: Belva Crome, MD;  Location: [place] CV LAB;  Service: Cardiovascular;  Laterality: N/A;     FAMILY HISTORY Family History  Problem Relation Age of Onset  . Hypertension Father   . Diabetes Father   . Cancer Mother   . Alcohol abuse Brother   . Colon cancer Neg Hx     SOCIAL HISTORY Social History   Tobacco Use  . Smoking status: Never Smoker  . Smokeless tobacco: Current User    Types: Snuff  . Tobacco comment: dips snuff about 6 cans/week for 3 years  Substance Use Topics  . Alcohol use: Yes    Alcohol/week: 1.0 standard drinks    Types: 1 Cans of beer per week    Comment: occ - has cut down, now once every 2-3 months  . Drug use: Yes    Types: Marijuana    Comment: occasional use - smoking  less         OPHTHALMIC EXAM:  Base Eye Exam    Visual Acuity (Snellen - Linear)      Right Left   Dist cc 20/30 -2 20/50 -2   Dist ph cc NI 20/40 -2   Correction:  Glasses       Tonometry (Tonopen, 8:12 AM)      Right Left   Pressure 14 15       Pupils      Dark Light Shape React APD   Right 4 2 Round Brisk None   Left 4 2 Round Brisk None       Visual [place]one (Counting fingers)      Left Right    Full Full       Neuro/Psych    Oriented x3:  Yes   Mood/Affect:  Normal       Dilation    Both eyes:  1.0% Mydriacyl, 2.5% Phenylephrine @ 8:12 AM        Slit Lamp and Fundus Exam    Slit Lamp Exam      Right Left   Lids/Lashes Dermatochalasis - upper lid Dermatochalasis - upper lid   Conjunctiva/Sclera Melanosis Melanosis   Cornea Arcus, trace inferior and central Punctate epithelial erosions Arcus, trace inferior and central Punctate epithelial erosions, round k scar inferonasal para-central   Anterior Chamber Deep and quiet Deep and quiet   Iris Round and dilated, No NVI Round and dilated, No NVI   Lens 2+ Nuclear sclerosis, 2+ Cortical cataract, Vacuoles 2+ Nuclear sclerosis, 2+ Cortical cataract, Vacuoles   Vitreous Vitreous syneresis, prominent vitreous base nasally  Vitreous syneresis, prominent vitreous base nasally       Fundus Exam       Right Left   Disc Normal Normal   C/D Ratio 0.4 0.4   Macula Good foveal reflex, scattered Microaneurysms, DBH, puncate exudates, interval increase in IRH ST to fovea Blunted foveal reflex, +persistent edema, scattered MAs, scattered DBH, scattered exudates -- improved, CWS along ST arcade   Vessels Mild Vascular attenuation Mild Vascular attenuation   Periphery Attached, scattered MAs, few CWS posteriorly -- improved, scattered White without pressure Attached, scattered DBH, scattered CWS -- improved, scattered White without pressure          IMAGING AND PROCEDURES  Imaging and Procedures for 05/31/17  OCT, Retina - OU - Both Eyes       Right Eye Quality was good. Central Foveal Thickness: 272. Progression has been stable. Findings include normal foveal contour, no SRF, intraretinal fluid (Interval increase in edema ST to fovea).   Left Eye Quality was good. Central Foveal Thickness: 293. Progression has worsened. Findings include normal foveal contour, intraretinal fluid, no SRF (Interval worsening of IRF).   Notes *Images captured and stored on drive  Diagnosis / Impression:  DME OU, OS>OD Interval increase in edema ST to fovea OD Interval worsening in IRF OS  Clinical management:  See below  Abbreviations: NFP - Normal foveal profile. CME - cystoid macular edema. PED - pigment epithelial detachment. IRF - intraretinal fluid. SRF - subretinal fluid. EZ - ellipsoid zone. ERM - epiretinal membrane. ORA - outer retinal atrophy. ORT - outer retinal tubulation. SRHM - subretinal hyper-reflective material         Intravitreal Injection, Pharmacologic Agent - OS - Left Eye       Time Out 10/20/2017. 8:58 AM. Confirmed correct patient, procedure, site, and patient consented.   Anesthesia Anesthetic medications included Lidocaine  2%, Tetracaine 0.5%.   Procedure Preparation included 5% betadine to ocular surface, eyelid speculum. A 30 gauge needle was used.    Injection:  2 mg aflibercept 2 MG/0.05ML   NDC: 61755-005-02, Lot: [id number], Expiration date: 09/20/2018   Route: Intravitreal, Site: Left Eye, Waste: 0.05 mL  Post-op Post injection exam found visual acuity of at least counting fingers. The patient tolerated the procedure well. There were no complications. The patient received written and verbal post procedure care education.                 ASSESSMENT/PLAN:    ICD-10-CM   1. Moderate nonproliferative diabetic retinopathy of both eyes with macular edema associated with type 2 diabetes mellitus (HCC) E11.3313 OCT, Retina - OU - Both Eyes    Intravitreal Injection, Pharmacologic Agent - OS - Left Eye    aflibercept (EYLEA) SOLN 2 mg  2. Retinal edema H35.81 OCT, Retina - OU - Both Eyes  3. Combined forms of age-related cataract of both eyes H25.813     1,2. Moderate to severe non-proliferative diabetic retinopathy, OU - initial exam showed scattered IRH, exudates, and cotton wool spots, OS > OD, no NV - FA 4.10.19 shows no NV OU - S/P IVA #1 OS (05.29.19), #2 (06.26.19), #3 (07.29.19) - OCT shows interval increase in diabetic macular edema OS, stable (minimal) OD - BCVA remains 20/40 OS - discussed possibility of switching therapy if edema does not improve significantly - recommend IVE #1 OS today (08.30.19) - pt wishes to proceed - RBA of procedure discussed, questions answered - informed consent obtained and signed - see procedure note - Eylea4U paper work and benefits investigation started on 07.29.19 -- Approved for Good Days - f/u in 4 weeks  3,4. Hypertensive retinopathy OU - discussed importance of tight BP control - monitor  5. Combined form age-related cataract OU-  - The symptoms of cataract, surgical options, and treatments and risks were discussed with patient. - discussed diagnosis and progression - not yet visually significant - monitor for now   Ophthalmic Meds Ordered this visit:  Meds ordered  this encounter  Medications  . aflibercept (EYLEA) SOLN 2 mg       Return in about 4 weeks (around 11/17/2017) for F/U NPDR OU, DFE, OCT, poss injxn.  There are no Patient Instructions on file for this visit.   Explained the diagnoses, plan, and follow up with the patient and they expressed understanding.  Patient expressed understanding of the importance of proper follow up care.   This document serves as a record of services personally performed by Gardiner Sleeper, MD, PhD. It was created on their behalf by Ernest Mallick, OA, an ophthalmic assistant. The creation of this record is the provider's dictation and/or activities during the visit.    Electronically signed by: Ernest Mallick, OA  08.29.2019 11:57 AM    Gardiner Sleeper, M.D., Ph.D. Diseases & Surgery of the Retina and Vitreous [facility]   I have reviewed the above documentation for accuracy and completeness, and I agree with the above. Gardiner Sleeper, M.D., Ph.D. 10/20/17 11:59 AM    Abbreviations: M myopia (nearsighted); A astigmatism; H hyperopia (farsighted); P presbyopia; Mrx spectacle prescription;  CTL contact lenses; OD right eye; OS left eye; OU both eyes  XT exotropia; ET esotropia; PEK punctate epithelial keratitis; PEE punctate epithelial erosions; DES dry eye syndrome; MGD meibomian gland dysfunction; ATs artificial tears; PFAT's preservative free artificial tears; [place] nuclear sclerotic  cataract; PSC posterior subcapsular cataract; ERM epi-retinal membrane; PVD posterior vitreous detachment; RD retinal detachment; DM diabetes mellitus; DR diabetic retinopathy; NPDR non-proliferative diabetic retinopathy; PDR proliferative diabetic retinopathy; CSME clinically significant macular edema; DME diabetic macular edema; dbh dot blot hemorrhages; CWS cotton wool spot; POAG primary open angle glaucoma; C/D cup-to-disc ratio; HVF humphrey visual field; GVF goldmann visual field; OCT optical coherence  tomography; IOP intraocular pressure; BRVO Branch retinal vein occlusion; CRVO central retinal vein occlusion; CRAO central retinal artery occlusion; BRAO branch retinal artery occlusion; RT retinal tear; SB scleral buckle; PPV pars plana vitrectomy; VH Vitreous hemorrhage; PRP panretinal laser photocoagulation; IVK intravitreal kenalog; VMT vitreomacular traction; MH Macular hole;  NVD neovascularization of the disc; NVE neovascularization elsewhere; AREDS age related eye disease study; ARMD age related macular degeneration; POAG primary open angle glaucoma; EBMD epithelial/anterior basement membrane dystrophy; ACIOL anterior chamber intraocular lens; IOL intraocular lens; PCIOL posterior chamber intraocular lens; Phaco/IOL phacoemulsification with intraocular lens placement; [place] photorefractive keratectomy; LASIK laser assisted in situ keratomileusis; HTN hypertension; DM diabetes mellitus; COPD chronic obstructive pulmonary disease

## 2017-10-20 ENCOUNTER — Encounter (INDEPENDENT_AMBULATORY_CARE_PROVIDER_SITE_OTHER): Payer: Self-pay | Admitting: Ophthalmology

## 2017-10-20 ENCOUNTER — Ambulatory Visit (INDEPENDENT_AMBULATORY_CARE_PROVIDER_SITE_OTHER): Payer: Medicare Other | Admitting: Ophthalmology

## 2017-10-20 DIAGNOSIS — H3581 Retinal edema: Secondary | ICD-10-CM

## 2017-10-20 DIAGNOSIS — E113313 Type 2 diabetes mellitus with moderate nonproliferative diabetic retinopathy with macular edema, bilateral: Secondary | ICD-10-CM

## 2017-10-20 DIAGNOSIS — H35033 Hypertensive retinopathy, bilateral: Secondary | ICD-10-CM | POA: Diagnosis not present

## 2017-10-20 DIAGNOSIS — I10 Essential (primary) hypertension: Secondary | ICD-10-CM

## 2017-10-20 DIAGNOSIS — H25813 Combined forms of age-related cataract, bilateral: Secondary | ICD-10-CM

## 2017-10-20 MED ORDER — AFLIBERCEPT 2MG/0.05ML IZ SOLN FOR KALEIDOSCOPE
2.0000 mg | INTRAVITREAL | Status: DC
  Administered 2017-10-20: 2 mg via INTRAVITREAL

## 2017-10-23 ENCOUNTER — Other Ambulatory Visit: Payer: Self-pay | Admitting: Pediatrics

## 2017-11-17 ENCOUNTER — Encounter (INDEPENDENT_AMBULATORY_CARE_PROVIDER_SITE_OTHER): Payer: Medicare Other | Admitting: Ophthalmology

## 2017-11-17 ENCOUNTER — Other Ambulatory Visit (HOSPITAL_COMMUNITY): Payer: Self-pay | Admitting: Student

## 2017-11-17 ENCOUNTER — Other Ambulatory Visit: Payer: Self-pay | Admitting: Pediatrics

## 2017-11-17 ENCOUNTER — Other Ambulatory Visit (HOSPITAL_COMMUNITY): Payer: Self-pay | Admitting: Internal Medicine

## 2017-11-17 DIAGNOSIS — I10 Essential (primary) hypertension: Secondary | ICD-10-CM

## 2017-11-20 ENCOUNTER — Other Ambulatory Visit: Payer: Self-pay

## 2017-11-20 NOTE — Progress Notes (Signed)
[facility] Note  11/21/2017     CHIEF COMPLAINT Patient presents for Retina Follow Up   HISTORY OF PRESENT ILLNESS: Brad Mendoza is a 62 y.o. male who presents to the clinic today for:   HPI    Retina Follow Up    Patient presents with  Diabetic Retinopathy.  In both eyes.  This started 3 months ago.  Severity is mild.  Since onset it is stable.  I, the attending physician,  performed the HPI with the patient and updated documentation appropriately.          Comments    F/U NPDR OU. Patient states his vision is "still blurry", denies new visual onsets. Patient is ready for tx today if indicted.       Last edited by [name], MD on 11/21/2017  8:50 AM. (History)       Referring physician: [name], MD [place], [place] [address]  HISTORICAL INFORMATION:   Selected notes from the MEDICAL RECORD NUMBER Referred by Dr. [name] for concern of CSME OS;  LEE- 04.04.19 ([name]) [BCVA OD: 20/20 OS: 20/40] Ocular Hx-  PMH- DM, CAD, CHF, HTN, hyperlipidemia    CURRENT MEDICATIONS: No current outpatient medications on file. (Ophthalmic Drugs)   Current Facility-Administered Medications (Ophthalmic Drugs)  Medication Route  . aflibercept (EYLEA) SOLN 2 mg Intravitreal  . aflibercept (EYLEA) SOLN 2 mg Intravitreal   Current Outpatient Medications (Other)  Medication Sig  . albuterol (PROVENTIL HFA;VENTOLIN HFA) 108 (90 Base) MCG/ACT inhaler Inhale 2 puffs into the lungs every 6 (six) hours as needed for wheezing or shortness of breath.  [name] aspirin EC 81 MG tablet Take 81 mg by mouth daily.  . Capsaicin (ICY HOT ARTHRITIS THERAPY EX) Apply 1 application topically 2 (two) times daily as needed (back pain).   . carvedilol (COREG) 12.5 MG tablet TAKE ONE TABLET TWICE A DAY WITH FOOD  . Ergocalciferol (VITAMIN D2) 400 units TABS Take 1 tablet by mouth daily.  . fluticasone (FLONASE) 50 MCG/ACT nasal spray USE 2 SPRAYS IN EACH NOSTRIL  DAILY  . hydrALAZINE (APRESOLINE) 50 MG tablet Take 1 tablet (50 mg total) by mouth 3 (three) times daily.  . isosorbide mononitrate (IMDUR) 60 MG 24 hr tablet Take 1.5 tablets (90 mg total) by mouth daily.  . metFORMIN (GLUCOPHAGE-XR) 500 MG 24 hr tablet Take 1 tablet (500 mg total) by mouth daily with breakfast. (Patient taking differently: Take 500 mg by mouth daily as needed (for blood sugars). )  . Multiple Vitamins-Minerals (CENTRUM SILVER ADULT 50+ PO) Take 1 tablet by mouth daily.  . nitroGLYCERIN (NITROSTAT) 0.4 MG SL tablet DISSOLVE 1 TAB UNDER TOUNGE FOR CHEST PAIN. MAY REPEAT EVERY 5 MINUTES FOR 3 DOSES. IF NO RELIEF CALL 911 OR GO TO ER  . Omega-3 Fatty Acids (FISH OIL) 1000 MG CPDR Take 1 capsule by mouth daily.  . pantoprazole (PROTONIX) 40 MG tablet TAKE ONE (1) TABLET EACH DAY  . ranolazine (RANEXA) 500 MG 12 hr tablet TAKE ONE TABLET BY MOUTH TWICE DAILY  . rosuvastatin (CRESTOR) 10 MG tablet TAKE ONE (1) TABLET EACH DAY  . sacubitril-valsartan (ENTRESTO) 97-103 MG Take 1 tablet by mouth 2 (two) times daily.  [name] spironolactone (ALDACTONE) 25 MG tablet Take 0.5 tablets (12.5 mg total) by mouth daily.  [name] terbinafine (LAMISIL) 1 % cream Apply 1 application topically 2 (two) times daily. Apply to both feet and between toes  .  torsemide (DEMADEX) 20 MG tablet Take 40 mg by mouth daily.  [name] 300 UNIT/ML SOPN Inject 15-40 Units into the skin See admin instructions. Inject 40 units in the morning and 15 units at night  . ULTICARE MINI PEN NEEDLES 31G X 6 MM MISC 4 TIMES DAILY   Current Facility-Administered Medications (Other)  Medication Route  . Bevacizumab (AVASTIN) SOLN 1.25 mg Intravitreal  . Bevacizumab (AVASTIN) SOLN 1.25 mg Intravitreal  . Bevacizumab (AVASTIN) SOLN 1.25 mg Intravitreal      REVIEW OF SYSTEMS: ROS    Positive for: Endocrine, Eyes   Negative for: Constitutional, Gastrointestinal, Neurological, Skin, Genitourinary, Musculoskeletal, HENT,  Cardiovascular, Respiratory, Psychiatric, Allergic/Imm, Heme/Lymph   Last edited by [name], [name] on 64/05/345  4:25 AM. (History)       ALLERGIES Allergies  Allergen Reactions  . Sulfa Antibiotics Shortness Of Breath  . Penicillins Rash    Has patient had a PCN reaction causing immediate rash, facial/tongue/throat swelling, SOB or lightheadedness with hypotension: No Has patient had a PCN reaction causing severe rash involving mucus membranes or skin necrosis: No Has patient had a PCN reaction that required hospitalization No Has patient had a PCN reaction occurring within the last 10 years: No If all of the above answers are "NO", then may proceed with Cephalosporin use.     PAST MEDICAL HISTORY Past Medical History:  Diagnosis Date  . Acid reflux   . Back pain    f4 and f5  . CAD (coronary artery disease)   . CHF (congestive heart failure) ([place])    last echo was in March 2016  . Diabetes mellitus 2005  . Diverticulosis   . H/O chest pain 2005   ECHO  . HTN (hypertension)   . Hyperlipidemia   . Nonischemic cardiomyopathy ([place])   . Onychomycosis   . Polysubstance abuse [facility])    Past Surgical History:  Procedure Laterality Date  . APPENDECTOMY    . CARDIAC CATHETERIZATION  2005   4 frech cath  . COLONOSCOPY N/A 08/01/2013   Procedure: COLONOSCOPY;  Surgeon: Rogene Houston, MD;  Location[facility];  Service: Endoscopy;  Laterality: N/A;  rescheduled to [place] notified pt  . ESOPHAGOGASTRODUODENOSCOPY N/A 04/23/2015   Procedure: ESOPHAGOGASTRODUODENOSCOPY (EGD);  Surgeon: Rogene Houston, MD;  Location: [facility];  Service: Endoscopy;  Laterality: N/A;  1:25  . RIGHT/LEFT HEART CATH AND CORONARY ANGIOGRAPHY N/A 06/03/2016   Procedure: Right/Left Heart Cath and Coronary Angiography;  Surgeon: Belva Crome, MD;  Location: [place] CV LAB;  Service: Cardiovascular;  Laterality: N/A;    FAMILY HISTORY Family History  Problem Relation Age of Onset  .  Hypertension Father   . Diabetes Father   . Cancer Mother   . Alcohol abuse Brother   . Colon cancer Neg Hx     SOCIAL HISTORY Social History   Tobacco Use  . Smoking status: Never Smoker  . Smokeless tobacco: Current User    Types: Snuff  . Tobacco comment: dips snuff about 6 cans/week for 3 years  Substance Use Topics  . Alcohol use: Yes    Alcohol/week: 1.0 standard drinks    Types: 1 Cans of beer per week    Comment: occ - has cut down, now once every 2-3 months  . Drug use: Yes    Types: Marijuana    Comment: occasional use - smoking less         OPHTHALMIC EXAM:  Base Eye Exam  Visual Acuity (Snellen - Linear)      Right Left   Dist cc 20/30 -1 20/60 -1   Dist ph cc 20/30 20/50   Correction:  Glasses       Tonometry (Tonopen, 8:22 AM)      Right Left   Pressure 16 17       Pupils      Dark Light Shape React APD   Right 4 3 Round Brisk None   Left 4 3 Round Brisk None       Visual Afzal (Counting fingers)      Left Right    Full Full       Extraocular Movement      Right Left    Full, Ortho Full, Ortho       Neuro/Psych    Oriented x3:  Yes   Mood/Affect:  Normal       Dilation    Both eyes:  1.0% Mydriacyl, 2.5% Phenylephrine @ 8:19 AM        Slit Lamp and Fundus Exam    Slit Lamp Exam      Right Left   Lids/Lashes Dermatochalasis - upper lid Dermatochalasis - upper lid   Conjunctiva/Sclera Melanosis Melanosis   Cornea Arcus, trace inferior and central Punctate epithelial erosions Arcus, trace inferior and central Punctate epithelial erosions, round k scar inferonasal para-central   Anterior Chamber Deep and quiet Deep and quiet   Iris Round and dilated, No NVI Round and dilated, No NVI   Lens 2+ Nuclear sclerosis, 2+ Cortical cataract, Vacuoles 2+ Nuclear sclerosis, 2+ Cortical cataract, Vacuoles   Vitreous Vitreous syneresis, prominent vitreous base nasally  Vitreous syneresis, prominent vitreous base nasally       Fundus  Exam      Right Left   Disc Normal Normal   C/D Ratio 0.4 0.4   Macula Good foveal reflex, scattered Microaneurysms, DBH, puncate exudates, interval decrease in cystic changes ST to fovea Blunted foveal reflex, +persistent edema, scattered MAs, scattered DBH, scattered exudates -- improved   Vessels Mild Vascular attenuation Mild Vascular attenuation   Periphery Attached, scattered MAs, few CWS posteriorly -- improved, scattered White without pressure Attached, scattered DBH, scattered CWS -- improved, scattered White without pressure          IMAGING AND PROCEDURES  Imaging and Procedures for 05/31/17  OCT, Retina - OU - Both Eyes       Right Eye Quality was good. Central Foveal Thickness: 274. Progression has been stable. Findings include normal foveal contour, no SRF, intraretinal fluid (Interval decrease in IRF).   Left Eye Quality was good. Central Foveal Thickness: 260. Progression has improved. Findings include normal foveal contour, intraretinal fluid, no SRF (Interval improvement of IRF).   Notes *Images captured and stored on drive  Diagnosis / Impression:  DME OU, OS>OD Mild persistent IRF OD Interval improvement in IRF OS  Clinical management:  See below  Abbreviations: NFP - Normal foveal profile. CME - cystoid macular edema. PED - pigment epithelial detachment. IRF - intraretinal fluid. SRF - subretinal fluid. EZ - ellipsoid zone. ERM - epiretinal membrane. ORA - outer retinal atrophy. ORT - outer retinal tubulation. SRHM - subretinal hyper-reflective material         Intravitreal Injection, Pharmacologic Agent - OS - Left Eye       Time Out 11/21/2017. 9:35 AM. Confirmed correct patient, procedure, site, and patient consented.   Anesthesia Anesthetic medications included Lidocaine 2%, Proparacaine 0.5%.   Procedure Preparation  included eyelid speculum, 5% betadine to ocular surface. A 30 gauge needle was used.   Injection:  2 mg aflibercept 2  MG/0.05ML   NDC: 61755-005-02, Lot: [id number], Expiration date: 09/20/2018   Route: Intravitreal, Site: Left Eye, Waste: 0.05 mL  Post-op Post injection exam found visual acuity of at least counting fingers. The patient tolerated the procedure well. There were no complications. The patient received written and verbal post procedure care education.                 ASSESSMENT/PLAN:    ICD-10-CM   1. Moderate nonproliferative diabetic retinopathy of both eyes with macular edema associated with type 2 diabetes mellitus (HCC) E11.3313 OCT, Retina - OU - Both Eyes    Intravitreal Injection, Pharmacologic Agent - OS - Left Eye    aflibercept (EYLEA) SOLN 2 mg  2. Retinal edema H35.81 OCT, Retina - OU - Both Eyes  3. Essential hypertension I10   4. Hypertensive retinopathy of both eyes H35.033   5. Combined forms of age-related cataract of both eyes H25.813     1,2. Moderate to severe non-proliferative diabetic retinopathy, OU - initial exam showed scattered IRH, exudates, and cotton wool spots, OS > OD, no NV - FA 4.10.19 shows no NV OU - S/P IVA OS #1 (05.29.19), #2 (06.26.19), #3 (07.29.19) - S/P IVE OS #1 (08.30.19) - OCT shows interval improvement in diabetic macular edema OU -- good initial response to Eylea - BCVA 20/50 OS - recommend IVE #2 OS today (10.01.19) - pt wishes to proceed - RBA of procedure discussed, questions answered - informed consent obtained and signed - see procedure note - Eylea4U paper work and benefits investigation started on 07.29.19 -- Approved for Good Days - f/u in 4 weeks  3,4. Hypertensive retinopathy OU - discussed importance of tight BP control - monitor  5. Combined form age-related cataract OU-  - The symptoms of cataract, surgical options, and treatments and risks were discussed with patient. - discussed diagnosis and progression - not yet visually significant - monitor for now   Ophthalmic Meds Ordered this visit:  Meds ordered  this encounter  Medications  . aflibercept (EYLEA) SOLN 2 mg       Return in about 4 weeks (around 12/19/2017) for F/U NPDR OU, DFE, OCT.  There are no Patient Instructions on file for this visit.   Explained the diagnoses, plan, and follow up with the patient and they expressed understanding.  Patient expressed understanding of the importance of proper follow up care.   This document serves as a record of services personally performed by Gardiner Sleeper, MD, PhD. It was created on their behalf by Catha Brow, [place], a certified ophthalmic assistant. The creation of this record is the provider's dictation and/or activities during the visit.  Electronically signed by: Catha Brow, COA  09.30.19 1:02 PM    Gardiner Sleeper, M.D., Ph.D. Diseases & Surgery of the Retina and Vitreous [facility]  I have reviewed the above documentation for accuracy and completeness, and I agree with the above. Gardiner Sleeper, M.D., Ph.D. 11/22/17 1:03 PM   Abbreviations: M myopia (nearsighted); A astigmatism; H hyperopia (farsighted); P presbyopia; Mrx spectacle prescription;  CTL contact lenses; OD right eye; OS left eye; OU both eyes  XT exotropia; ET esotropia; PEK punctate epithelial keratitis; PEE punctate epithelial erosions; DES dry eye syndrome; MGD meibomian gland dysfunction; ATs artificial tears; PFAT's preservative free artificial tears; [place] nuclear sclerotic cataract;  PSC posterior subcapsular cataract; ERM epi-retinal membrane; PVD posterior vitreous detachment; RD retinal detachment; DM diabetes mellitus; DR diabetic retinopathy; NPDR non-proliferative diabetic retinopathy; PDR proliferative diabetic retinopathy; CSME clinically significant macular edema; DME diabetic macular edema; dbh dot blot hemorrhages; CWS cotton wool spot; POAG primary open angle glaucoma; C/D cup-to-disc ratio; HVF humphrey visual field; GVF goldmann visual field; OCT optical coherence  tomography; IOP intraocular pressure; BRVO Branch retinal vein occlusion; CRVO central retinal vein occlusion; CRAO central retinal artery occlusion; BRAO branch retinal artery occlusion; RT retinal tear; SB scleral buckle; PPV pars plana vitrectomy; VH Vitreous hemorrhage; PRP panretinal laser photocoagulation; IVK intravitreal kenalog; VMT vitreomacular traction; MH Macular hole;  NVD neovascularization of the disc; NVE neovascularization elsewhere; AREDS age related eye disease study; ARMD age related macular degeneration; POAG primary open angle glaucoma; EBMD epithelial/anterior basement membrane dystrophy; ACIOL anterior chamber intraocular lens; IOL intraocular lens; PCIOL posterior chamber intraocular lens; Phaco/IOL phacoemulsification with intraocular lens placement; [place] photorefractive keratectomy; LASIK laser assisted in situ keratomileusis; HTN hypertension; DM diabetes mellitus; COPD chronic obstructive pulmonary disease

## 2017-11-20 NOTE — Patient Outreach (Signed)
[name]) Care Management  11/20/2017   Brad Mendoza 1955/11/18 [id number]  Subjective: Telephone call to the patient for assessment.  HIPAA verified. The patient states that he is doing fine.  The patient denies any chest pain, shortness of breath or swelling.  He reports that has not weighed today.  He states that the floors in his home are uneven and he never gets the correct reading.  He states that he stays around 219- 220 lbs.  I encouraged the patient to weigh daily and the importance of it.  He verbalized understanding.   He states that his blood sugar this morning fasting was 181.  It has been elevated for about a week now.  He states he knows why it has been elevated. He has been eating raisin cakes for the last week.  We discussed his food intake and moderation. He verbalized understanding.  He is taking his medications as prescribed.  He has an appointment with his eye doctor to have another injection in his eye this Wednesday and an appointment with his cardiologist on October 17 th.     Current Medications:  Current Outpatient Medications  Medication Sig Dispense Refill  . albuterol (PROVENTIL HFA;VENTOLIN HFA) 108 (90 Base) MCG/ACT inhaler Inhale 2 puffs into the lungs every 6 (six) hours as needed for wheezing or shortness of breath. 1 Inhaler 2  . aspirin EC 81 MG tablet Take 81 mg by mouth daily.    . Capsaicin (ICY HOT ARTHRITIS THERAPY EX) Apply 1 application topically 2 (two) times daily as needed (back pain).     . carvedilol (COREG) 12.5 MG tablet TAKE ONE TABLET TWICE A DAY WITH FOOD 180 tablet 1  . Ergocalciferol (VITAMIN D2) 400 units TABS Take 1 tablet by mouth daily.    . fluticasone (FLONASE) 50 MCG/ACT nasal spray USE 2 SPRAYS IN EACH NOSTRIL DAILY 48 g 1  . hydrALAZINE (APRESOLINE) 50 MG tablet Take 1 tablet (50 mg total) by mouth 3 (three) times daily. 90 tablet 11  . isosorbide mononitrate (IMDUR) 60 MG 24 hr tablet Take 1.5 tablets (90 mg  total) by mouth daily. 135 tablet 1  . metFORMIN (GLUCOPHAGE-XR) 500 MG 24 hr tablet Take 1 tablet (500 mg total) by mouth daily with breakfast. (Patient taking differently: Take 500 mg by mouth daily as needed (for blood sugars). ) 90 tablet 1  . Multiple Vitamins-Minerals (CENTRUM SILVER ADULT 50+ PO) Take 1 tablet by mouth daily.    . nitroGLYCERIN (NITROSTAT) 0.4 MG SL tablet DISSOLVE 1 TAB UNDER TOUNGE FOR CHEST PAIN. MAY REPEAT EVERY 5 MINUTES FOR 3 DOSES. IF NO RELIEF CALL 911 OR GO TO ER 25 tablet 2  . Omega-3 Fatty Acids (FISH OIL) 1000 MG CPDR Take 1 capsule by mouth daily.    . pantoprazole (PROTONIX) 40 MG tablet TAKE ONE (1) TABLET EACH DAY 90 tablet 1  . RANEXA 500 MG 12 hr tablet TAKE ONE TABLET BY MOUTH TWICE DAILY 60 tablet 6  . rosuvastatin (CRESTOR) 10 MG tablet TAKE ONE (1) TABLET EACH DAY 90 tablet 1  . sacubitril-valsartan (ENTRESTO) 97-103 MG Take 1 tablet by mouth 2 (two) times daily. 180 tablet 3  . spironolactone (ALDACTONE) 25 MG tablet Take 0.5 tablets (12.5 mg total) by mouth daily. 45 tablet 3  . terbinafine (LAMISIL) 1 % cream Apply 1 application topically 2 (two) times daily. Apply to both feet and between toes 30 g 1  . torsemide (DEMADEX) 20 MG tablet  Take 40 mg by mouth daily.    [name] 300 UNIT/ML SOPN Inject 15-40 Units into the skin See admin instructions. Inject 40 units in the morning and 15 units at night    . ULTICARE MINI PEN NEEDLES 31G X 6 MM MISC 4 TIMES DAILY 100 each 2   Current Facility-Administered Medications  Medication Dose Route Frequency Provider Last Rate Last Dose  . aflibercept (EYLEA) SOLN 2 mg  2 mg Intravitreal  [name], MD   2 mg at 10/20/17 0858  . Bevacizumab (AVASTIN) SOLN 1.25 mg  1.25 mg Intravitreal  [name], MD   1.25 mg at 07/19/17 0849  . Bevacizumab (AVASTIN) SOLN 1.25 mg  1.25 mg Intravitreal  [name], MD   1.25 mg at 08/16/17 1232  . Bevacizumab (AVASTIN) SOLN 1.25 mg  1.25 mg Intravitreal   [name], MD   1.25 mg at 09/18/17 1024    Functional Status:  In your present state of health, do you have any difficulty performing the following activities: 12/14/2016  Hearing? N  Vision? Y  Difficulty concentrating or making decisions? N  Walking or climbing stairs? N  Dressing or bathing? Y  Doing errands, shopping? Y  Preparing Food and eating ? N  Using the Toilet? N  In the past six months, have you accidently leaked urine? N  Do you have problems with loss of bowel control? N  Managing your Medications? N  Managing your Finances? N  Housekeeping or managing your Housekeeping? N  Some recent data might be hidden    Fall/Depression Screening: Fall Risk  11/20/2017 10/17/2017 09/18/2017  Falls in the past year? No No No  Comment - - -  Number falls in past yr: - - -  Injury with Fall? - - -  Comment - - -  Risk for fall due to : - - -  Follow up - - -   PHQ 2/9 Scores 12/14/2016 12/02/2016 09/26/2016 09/14/2016 09/01/2016 08/22/2016 07/07/2016  PHQ - 2 Score 1 0 0 0 0 0 0    Assessment: Patient will continue to benefit from health coach outreach for disease management and support.  THN CM Care Plan Problem One     Most Recent Value  THN Long Term Goal   In 90 days the patient will verbalize he has not been seen in the hospital  Interventions for Problem One Long Term Goal  Discussed diet , signs and symptoms of chf encoraged the patient to weigh daily      Plan: [facility] will contact patient in the month of October and patient agrees to next outreach.  [name] RN, BSN, [place] Direct Dial:  [phone]  Fax: [phone]

## 2017-11-21 ENCOUNTER — Encounter (INDEPENDENT_AMBULATORY_CARE_PROVIDER_SITE_OTHER): Payer: Self-pay | Admitting: Ophthalmology

## 2017-11-21 ENCOUNTER — Ambulatory Visit (INDEPENDENT_AMBULATORY_CARE_PROVIDER_SITE_OTHER): Payer: Medicare Other | Admitting: Ophthalmology

## 2017-11-21 DIAGNOSIS — E113313 Type 2 diabetes mellitus with moderate nonproliferative diabetic retinopathy with macular edema, bilateral: Secondary | ICD-10-CM

## 2017-11-21 DIAGNOSIS — H3581 Retinal edema: Secondary | ICD-10-CM

## 2017-11-21 DIAGNOSIS — H35033 Hypertensive retinopathy, bilateral: Secondary | ICD-10-CM | POA: Diagnosis not present

## 2017-11-21 DIAGNOSIS — H25813 Combined forms of age-related cataract, bilateral: Secondary | ICD-10-CM

## 2017-11-21 DIAGNOSIS — I10 Essential (primary) hypertension: Secondary | ICD-10-CM

## 2017-11-21 MED ORDER — AFLIBERCEPT 2MG/0.05ML IZ SOLN FOR KALEIDOSCOPE
2.0000 mg | INTRAVITREAL | Status: DC
  Administered 2017-11-21: 2 mg via INTRAVITREAL

## 2017-11-22 ENCOUNTER — Encounter (INDEPENDENT_AMBULATORY_CARE_PROVIDER_SITE_OTHER): Payer: Self-pay | Admitting: Ophthalmology

## 2017-11-29 ENCOUNTER — Other Ambulatory Visit (HOSPITAL_COMMUNITY): Payer: Self-pay

## 2017-11-29 MED ORDER — SACUBITRIL-VALSARTAN 97-103 MG PO TABS: 1.0000 | ORAL_TABLET | Freq: Two times a day (BID) | ORAL | 1 refills | Status: DC

## 2017-12-08 ENCOUNTER — Other Ambulatory Visit: Payer: Self-pay

## 2017-12-08 ENCOUNTER — Other Ambulatory Visit (HOSPITAL_COMMUNITY): Payer: Self-pay

## 2017-12-08 ENCOUNTER — Encounter (HOSPITAL_COMMUNITY): Payer: Self-pay | Admitting: Internal Medicine

## 2017-12-08 ENCOUNTER — Other Ambulatory Visit: Payer: Self-pay | Admitting: Pediatrics

## 2017-12-08 ENCOUNTER — Ambulatory Visit (HOSPITAL_COMMUNITY)
Admission: RE | Admit: 2017-12-08 | Discharge: 2017-12-08 | Disposition: A | Discharge status: Home / Self Care | Payer: Medicare Other | Source: Ambulatory Visit | Attending: Internal Medicine | Admitting: Internal Medicine

## 2017-12-08 VITALS — BP 111/56 | HR 80 | Wt 223.4 lb

## 2017-12-08 DIAGNOSIS — Z7982 Long term (current) use of aspirin: Secondary | ICD-10-CM | POA: Insufficient documentation

## 2017-12-08 DIAGNOSIS — F17200 Nicotine dependence, unspecified, uncomplicated: Secondary | ICD-10-CM | POA: Insufficient documentation

## 2017-12-08 DIAGNOSIS — Z79899 Other long term (current) drug therapy: Secondary | ICD-10-CM | POA: Diagnosis not present

## 2017-12-08 DIAGNOSIS — I5042 Chronic combined systolic (congestive) and diastolic (congestive) heart failure: Secondary | ICD-10-CM

## 2017-12-08 DIAGNOSIS — I428 Other cardiomyopathies: Secondary | ICD-10-CM | POA: Insufficient documentation

## 2017-12-08 DIAGNOSIS — E1122 Type 2 diabetes mellitus with diabetic chronic kidney disease: Secondary | ICD-10-CM | POA: Insufficient documentation

## 2017-12-08 DIAGNOSIS — Z882 Allergy status to sulfonamides status: Secondary | ICD-10-CM | POA: Insufficient documentation

## 2017-12-08 DIAGNOSIS — N183 Chronic kidney disease, stage 3 unspecified: Secondary | ICD-10-CM

## 2017-12-08 DIAGNOSIS — I130 Hypertensive heart and chronic kidney disease with heart failure and stage 1 through stage 4 chronic kidney disease, or unspecified chronic kidney disease: Secondary | ICD-10-CM | POA: Insufficient documentation

## 2017-12-08 DIAGNOSIS — E1142 Type 2 diabetes mellitus with diabetic polyneuropathy: Secondary | ICD-10-CM

## 2017-12-08 DIAGNOSIS — F1010 Alcohol abuse, uncomplicated: Secondary | ICD-10-CM | POA: Diagnosis not present

## 2017-12-08 DIAGNOSIS — E785 Hyperlipidemia, unspecified: Secondary | ICD-10-CM | POA: Diagnosis not present

## 2017-12-08 DIAGNOSIS — R0683 Snoring: Secondary | ICD-10-CM

## 2017-12-08 DIAGNOSIS — K219 Gastro-esophageal reflux disease without esophagitis: Secondary | ICD-10-CM

## 2017-12-08 DIAGNOSIS — Z880 Allergy status to penicillin: Secondary | ICD-10-CM | POA: Insufficient documentation

## 2017-12-08 DIAGNOSIS — I5022 Chronic systolic (congestive) heart failure: Secondary | ICD-10-CM | POA: Diagnosis not present

## 2017-12-08 DIAGNOSIS — I2510 Atherosclerotic heart disease of native coronary artery without angina pectoris: Secondary | ICD-10-CM | POA: Diagnosis not present

## 2017-12-08 DIAGNOSIS — Z8249 Family history of ischemic heart disease and other diseases of the circulatory system: Secondary | ICD-10-CM | POA: Diagnosis not present

## 2017-12-08 DIAGNOSIS — Z794 Long term (current) use of insulin: Secondary | ICD-10-CM

## 2017-12-08 DIAGNOSIS — E7800 Pure hypercholesterolemia, unspecified: Secondary | ICD-10-CM

## 2017-12-08 DIAGNOSIS — Z833 Family history of diabetes mellitus: Secondary | ICD-10-CM | POA: Diagnosis not present

## 2017-12-08 DIAGNOSIS — Z7984 Long term (current) use of oral hypoglycemic drugs: Secondary | ICD-10-CM | POA: Diagnosis not present

## 2017-12-08 DIAGNOSIS — R5383 Other fatigue: Secondary | ICD-10-CM | POA: Diagnosis not present

## 2017-12-08 LAB — BASIC METABOLIC PANEL
ANION GAP: 10 (ref 5–15)
BUN: 44 mg/dL — ABNORMAL HIGH (ref 8–23)
CHLORIDE: 102 mmol/L (ref 98–111)
CO2: 25 mmol/L (ref 22–32)
Calcium: 8.8 mg/dL — ABNORMAL LOW (ref 8.9–10.3)
Creatinine, Ser: 2.8 mg/dL — ABNORMAL HIGH (ref 0.61–1.24)
GFR calc non Af Amer: 23 mL/min — ABNORMAL LOW (ref >60)
GFR, EST AFRICAN AMERICAN: 26 mL/min — AB (ref >60)
GLUCOSE: 241 mg/dL — AB (ref 70–99)
POTASSIUM: 4.3 mmol/L (ref 3.5–5.1)
Sodium: 137 mmol/L (ref 135–145)

## 2017-12-08 LAB — BRAIN NATRIURETIC PEPTIDE: B Natriuretic Peptide: 239.2 pg/mL — ABNORMAL HIGH (ref 0.0–100.0)

## 2017-12-08 MED ORDER — EMPAGLIFLOZIN 10 MG PO TABS: 10.0000 mg | ORAL_TABLET | Freq: Every day | ORAL | 6 refills | Status: DC

## 2017-12-08 NOTE — Patient Instructions (Signed)
Start Jardiance 10 mg daily  Labs today  Your physician has recommended that you have a sleep study. This test records several body functions during sleep, including: brain activity, eye movement, oxygen and carbon dioxide blood levels, heart rate and rhythm, breathing rate and rhythm, the flow of air through your mouth and nose, snoring, body muscle movements, and chest and belly movement.  Your physician recommends that you schedule a follow-up appointment in: 3 months with echocardiogram

## 2017-12-08 NOTE — Progress Notes (Signed)
Advanced Heart Failure Clinic Note   Primary Care: Dr. Alain Honey PCP-Cardiologist:   HPI: Brad Mendoza is a 62 y.o. male with a past medical history of systolic HF due to NICM, HTN, HLD, DM and CAD.   [facility] June 2005 with 40-50% mid LAD stenosis, EF 45% at that time.    Admitted on 06/01/16 with chest pain relieved with nitroglycerin and volume overload. Troponin negative. Admission weight was 230 pounds. Right and left heart cath on 06/03/16 with 70-75% stenosis in the proximal and mid LAD,90% ostial stenosis in the 1st diagonal, and  60-70% stenosis in the mid RCA. RHC showed Fick C.O. 5.86/index 2.74. PA mean 33 mm Hg, LVEDP 25 mm Hg. EF by cath appears to be <25%. Medical therapy was recommended in the absence of ACS, and PCI was felt to be high risk with severe cardiomyopathy. CVTS was consulted and felt that CABG would not benefit him as he had NICM prior to his CAD. Discharge weight 207 pounds.   He presents today for routine follow-up. Says he feels ok but gets tired easily. Can work in yard with tiller and weeding but has to take frequent breaks. Occasional tightness in his left chest and side but not related to activity. Having heartburn as well after PCP switched him off Nexium. No edema, orthopnea or PND. Blood sugars running high. Has not seen nephrologist recently. Weight stable at 223. Been eating a lot of biscuits.   Echo 4/19 EF 25% (read as 40-45%)    Review of systems complete and found to be negative unless listed in HPI.    [facility] 05/2016 Showed 70-75% stenosis in the proximal and mid LAD,90% ostial stenosis in the 1st diagonal, and  60-70% stenosis in the mid RCA. [place] 05/2016-  Fick C.O. 5.86/index 2.74  Past Medical History:  Diagnosis Date  . Acid reflux   . Back pain    f4 and f5  . CAD (coronary artery disease)   . CHF (congestive heart failure) ([place])    last echo was in March 2016  . Diabetes mellitus 2005  . Diverticulosis   . H/O chest pain 2005   ECHO    . HTN (hypertension)   . Hyperlipidemia   . Nonischemic cardiomyopathy ([place])   . Onychomycosis   . Polysubstance abuse ([place])     Current Outpatient Medications  Medication Sig Dispense Refill  . albuterol (PROVENTIL HFA;VENTOLIN HFA) 108 (90 Base) MCG/ACT inhaler Inhale 2 puffs into the lungs every 6 (six) hours as needed for wheezing or shortness of breath. 1 Inhaler 2  . aspirin EC 81 MG tablet Take 81 mg by mouth daily.    . Capsaicin (ICY HOT ARTHRITIS THERAPY EX) Apply 1 application topically 2 (two) times daily as needed (back pain).     . carvedilol (COREG) 12.5 MG tablet TAKE ONE TABLET TWICE A DAY WITH FOOD 180 tablet 1  . Ergocalciferol (VITAMIN D2) 400 units TABS Take 1 tablet by mouth daily.    . fluticasone (FLONASE) 50 MCG/ACT nasal spray USE 2 SPRAYS IN EACH NOSTRIL DAILY 48 g 1  . hydrALAZINE (APRESOLINE) 50 MG tablet Take 1 tablet (50 mg total) by mouth 3 (three) times daily. 90 tablet 11  . isosorbide mononitrate (IMDUR) 60 MG 24 hr tablet Take 1.5 tablets (90 mg total) by mouth daily. 135 tablet 1  . metFORMIN (GLUCOPHAGE-XR) 500 MG 24 hr tablet Take 1 tablet (500 mg total) by mouth daily with breakfast. 90 tablet 1  .  Multiple Vitamins-Minerals (CENTRUM SILVER ADULT 50+ PO) Take 1 tablet by mouth daily.    . nitroGLYCERIN (NITROSTAT) 0.4 MG SL tablet DISSOLVE 1 TAB UNDER TOUNGE FOR CHEST PAIN. MAY REPEAT EVERY 5 MINUTES FOR 3 DOSES. IF NO RELIEF CALL 911 OR GO TO ER 25 tablet 2  . Omega-3 Fatty Acids (FISH OIL) 1000 MG CPDR Take 1 capsule by mouth daily.    . pantoprazole (PROTONIX) 40 MG tablet TAKE ONE (1) TABLET EACH DAY 90 tablet 1  . ranolazine (RANEXA) 500 MG 12 hr tablet TAKE ONE TABLET BY MOUTH TWICE DAILY 60 tablet 5  . rosuvastatin (CRESTOR) 10 MG tablet TAKE ONE (1) TABLET EACH DAY 90 tablet 1  . sacubitril-valsartan (ENTRESTO) 97-103 MG Take 1 tablet by mouth 2 (two) times daily. 180 tablet 1  . spironolactone (ALDACTONE) 25 MG tablet Take 0.5 tablets (12.5  mg total) by mouth daily. 45 tablet 3  . terbinafine (LAMISIL) 1 % cream Apply 1 application topically 2 (two) times daily. Apply to both feet and between toes 30 g 1  . torsemide (DEMADEX) 20 MG tablet Take 40 mg by mouth daily.    [name] 300 UNIT/ML SOPN Inject 15-40 Units into the skin See admin instructions. Inject 40 units in the morning and 15 units at night    . ULTICARE MINI PEN NEEDLES 31G X 6 MM MISC 4 TIMES DAILY 100 each 2   Current Facility-Administered Medications  Medication Dose Route Frequency Provider Last Rate Last Dose  . aflibercept (EYLEA) SOLN 2 mg  2 mg Intravitreal  [name], MD   2 mg at 10/20/17 0858  . aflibercept (EYLEA) SOLN 2 mg  2 mg Intravitreal  [name], MD   2 mg at 11/21/17 1214  . Bevacizumab (AVASTIN) SOLN 1.25 mg  1.25 mg Intravitreal  [name], MD   1.25 mg at 07/19/17 0849  . Bevacizumab (AVASTIN) SOLN 1.25 mg  1.25 mg Intravitreal  [name], MD   1.25 mg at 08/16/17 1232  . Bevacizumab (AVASTIN) SOLN 1.25 mg  1.25 mg Intravitreal  [name], MD   1.25 mg at 09/18/17 1024    Allergies  Allergen Reactions  . Sulfa Antibiotics Shortness Of Breath  . Penicillins Rash    Has patient had a PCN reaction causing immediate rash, facial/tongue/throat swelling, SOB or lightheadedness with hypotension: No Has patient had a PCN reaction causing severe rash involving mucus membranes or skin necrosis: No Has patient had a PCN reaction that required hospitalization No Has patient had a PCN reaction occurring within the last 10 years: No If all of the above answers are "NO", then may proceed with Cephalosporin use.    Social History   Socioeconomic History  . Marital status: Married    Spouse name: Not on file  . Number of children: Not on file  . Years of education: Not on file  . Highest education level: Not on file  Occupational History  . Occupation: disabled  Social Needs  . Financial resource strain: Not on file    . Food insecurity:    Worry: Not on file    Inability: Not on file  . Transportation needs:    Medical: Not on file    Non-medical: Not on file  Tobacco Use  . Smoking status: Never Smoker  . Smokeless tobacco: Current User    Types: Snuff  . Tobacco comment: dips snuff about 6 cans/week for 3 years  Substance and Sexual Activity  .  Alcohol use: Yes    Alcohol/week: 1.0 standard drinks    Types: 1 Cans of beer per week    Comment: occ - has cut down, now once every 2-3 months  . Drug use: Yes    Types: Marijuana    Comment: occasional use - smoking less  . Sexual activity: Yes  Lifestyle  . Physical activity:    Days per week: Not on file    Minutes per session: Not on file  . Stress: Not on file  Relationships  . Social connections:    Talks on phone: Not on file    Gets together: Not on file    Attends religious service: Not on file    Active member of club or organization: Not on file    Attends meetings of clubs or organizations: Not on file    Relationship status: Not on file  . Intimate partner violence:    Fear of current or ex partner: Not on file    Emotionally abused: Not on file    Physically abused: Not on file    Forced sexual activity: Not on file  Other Topics Concern  . Not on file  Social History Narrative  . Not on file    Family History  Problem Relation Age of Onset  . Hypertension Father   . Diabetes Father   . Cancer Mother   . Alcohol abuse Brother   . Colon cancer Neg Hx    Vitals:   12/08/17 1010  BP: (!) 111/56  Pulse: 80  SpO2: 97%  Weight: 101.3 kg (223 lb 6.4 oz)   Wt Readings from Last 3 Encounters:  12/08/17 101.3 kg (223 lb 6.4 oz)  05/31/17 95.9 kg (211 lb 8 oz)  03/30/17 103 kg (227 lb)    PHYSICAL EXAM: General:  Obese Well appearing. No resp difficulty HEENT: normal Neck: supple. no JVD. Carotids 2+ bilat; no bruits. No lymphadenopathy or thryomegaly appreciated. Cor: PMI nondisplaced. Regular rate & rhythm. No  rubs, gallops or murmurs. Lungs: clear Abdomen: obese soft, nontender, nondistended. No hepatosplenomegaly. No bruits or masses. Good bowel sounds. Extremities: no cyanosis, clubbing, rash, edema Neuro: alert & orientedx3, cranial nerves grossly intact. moves all 4 extremities w/o difficulty. Affect pleasant   ASSESSMENT & PLAN: 1. 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 He had preexisting NICM diagnosed in 2005 when his EF was 35% and had normal cors. Echo in 05/2016 with EF 30-35% and cath showed  70-75% stenosis in the proximal and mid LAD,90% ostial stenosis in the 1st diagonal, and  60-70% stenosis in the mid RCA. [place] 05/2016-  Fick C.O. 5.86/index 2.74 - Stable NYHA II - Volume status looks good - Echo from 4/19 reviewed personally EF read as 40-45% but it is more like 25% - Continue torsemide 40 daily  - Continue Entresto 97/103 mg BID.  - Continue Coreg 12.5 mg BID may be able to increase at next visit.  - Continue isosorbide 90 mg daily.  - Continue hydralazine 50 mg TID.  - Continue spiro 25mg  daily - Reinforced fluid restriction to < 2 L daily, sodium restriction to less than 2000 mg daily, and the importance of daily weights.    - Will refer for ICD. Start [place].  - Consider CPX in near future  2. CAD - [facility] 05/2016 showed 70-75% stenosis in the proximal and mid LAD,90% ostial stenosis in the 1st diagonal, and  60-70% stenosis in the mid RCA. [place] 05/2016-  Fick C.O.  5.86/index 2.74 Medical management recommended.  - CVTS consulted felt that CABG would not improve EF as he has pre existing CM.  - I have reviewed cath films today and LAD lesion is non-obstructive and this truly is NICM. I feel meidcal treatment is best option here for now.  - Continue isosorbide and Ranexa.No current angina - Continue ASA and statin.   3. ETOH abuse - Congratulated him on cessation   4. Tobacco abuse - Chews tobacco.  - Encouraged complete cessation.   5. HTN - Blood pressure well  controlled. Will add Jardiance  6. CKD stage III - Baseline creatinine 1.7-2.2.  - Follows with Dr. Hinda Lenis - Will recheck today  7. DM - Per PCP. - Will add Jardiance 10   8. Fatigue - check sleep study  Total time spent 35 minutes. Over half that time spent discussing above.   Glori Bickers, MD 12/08/17

## 2017-12-15 ENCOUNTER — Other Ambulatory Visit (HOSPITAL_COMMUNITY): Payer: Self-pay | Admitting: Internal Medicine

## 2017-12-15 LAB — BASIC METABOLIC PANEL
BUN / CREAT RATIO: 23 (ref 10–24)
BUN: 58 mg/dL — ABNORMAL HIGH (ref 8–27)
CO2: 23 mmol/L (ref 20–29)
CREATININE: 2.57 mg/dL — AB (ref 0.76–1.27)
Calcium: 8.6 mg/dL (ref 8.6–10.2)
Chloride: 102 mmol/L (ref 96–106)
GFR calc Af Amer: 30 mL/min/{1.73_m2} — ABNORMAL LOW (ref >59)
GFR calc non Af Amer: 26 mL/min/{1.73_m2} — ABNORMAL LOW (ref >59)
GLUCOSE: 269 mg/dL — AB (ref 65–99)
Potassium: 5.2 mmol/L (ref 3.5–5.2)
Sodium: 139 mmol/L (ref 134–144)

## 2017-12-18 ENCOUNTER — Telehealth (HOSPITAL_COMMUNITY): Payer: Self-pay

## 2017-12-18 NOTE — Telephone Encounter (Signed)
Pt called and stated that Linna Hoff had put him on Jardiance on his last visit 10/18. Pt reports that he is very fatigued, sleep all the time, and having neck pain. Pt is currently taking 10 mg (once daily). Please advise.

## 2017-12-18 NOTE — Telephone Encounter (Signed)
He is scheduled for a sleep study for his fatigue, as fatigue was not a reported side effect from [place] during studies.   The neck pain is very unlikely from this medication or from his heart. Should see his PCP.   If he is dizzy or lightheaded, we may need to cut back his torsemide since starting Jardiance. Otherwise, would continue same for now and recommend follow up with PCP.    Legrand Como [address]" Dix, PA-C 12/18/2017 12:11 PM

## 2017-12-18 NOTE — Telephone Encounter (Signed)
Pt notified Verbalizes understanding 

## 2017-12-19 ENCOUNTER — Encounter (INDEPENDENT_AMBULATORY_CARE_PROVIDER_SITE_OTHER): Payer: Medicare Other | Admitting: Ophthalmology

## 2017-12-19 NOTE — Progress Notes (Signed)
[facility] Note  12/21/2017     CHIEF COMPLAINT Patient presents for Retina Follow Up   HISTORY OF PRESENT ILLNESS: Brad Mendoza is a 62 y.o. male who presents to the clinic today for:   HPI    Retina Follow Up    Patient presents with  Diabetic Retinopathy.  In left eye.  Severity is moderate.  Duration of 4 weeks.  I, the attending physician,  performed the HPI with the patient and updated documentation appropriately.          Comments    Patient states vision about the same OU. BS was 181 this am. Last A1c is unknown.        Last edited by [name], MD on 12/21/2017  8:25 AM. (History)       Referring physician: [name], MD [place], [place] [address]  HISTORICAL INFORMATION:   Selected notes from the MEDICAL RECORD NUMBER Referred by Dr. [name] for concern of CSME OS;  LEE- 04.04.19 ([name]) [BCVA OD: 20/20 OS: 20/40] Ocular Hx-  PMH- DM, CAD, CHF, HTN, hyperlipidemia    CURRENT MEDICATIONS: No current outpatient medications on file. (Ophthalmic Drugs)   Current Facility-Administered Medications (Ophthalmic Drugs)  Medication Route  . aflibercept (EYLEA) SOLN 2 mg Intravitreal  . aflibercept (EYLEA) SOLN 2 mg Intravitreal  . aflibercept (EYLEA) SOLN 2 mg Intravitreal   Current Outpatient Medications (Other)  Medication Sig  . albuterol (PROVENTIL HFA;VENTOLIN HFA) 108 (90 Base) MCG/ACT inhaler Inhale 2 puffs into the lungs every 6 (six) hours as needed for wheezing or shortness of breath.  [name] aspirin EC 81 MG tablet Take 81 mg by mouth daily.  . Capsaicin (ICY HOT ARTHRITIS THERAPY EX) Apply 1 application topically 2 (two) times daily as needed (back pain).   . carvedilol (COREG) 12.5 MG tablet TAKE ONE TABLET TWICE A DAY WITH FOOD  . empagliflozin (JARDIANCE) 10 MG TABS tablet Take 10 mg by mouth daily.  . Ergocalciferol (VITAMIN D2) 400 units TABS Take 1 tablet by mouth daily.  . fluticasone (FLONASE)  50 MCG/ACT nasal spray USE 2 SPRAYS IN EACH NOSTRIL DAILY  . hydrALAZINE (APRESOLINE) 50 MG tablet Take 1 tablet (50 mg total) by mouth 3 (three) times daily.  . isosorbide mononitrate (IMDUR) 60 MG 24 hr tablet Take 1.5 tablets (90 mg total) by mouth daily.  . metFORMIN (GLUCOPHAGE-XR) 500 MG 24 hr tablet Take 1 tablet (500 mg total) by mouth daily with breakfast.  . Multiple Vitamins-Minerals (CENTRUM SILVER ADULT 50+ PO) Take 1 tablet by mouth daily.  . nitroGLYCERIN (NITROSTAT) 0.4 MG SL tablet DISSOLVE 1 TAB UNDER TOUNGE FOR CHEST PAIN. MAY REPEAT EVERY 5 MINUTES FOR 3 DOSES. IF NO RELIEF CALL 911 OR GO TO ER  . Omega-3 Fatty Acids (FISH OIL) 1000 MG CPDR Take 1 capsule by mouth daily.  . pantoprazole (PROTONIX) 40 MG tablet TAKE ONE (1) TABLET EACH DAY  . ranolazine (RANEXA) 500 MG 12 hr tablet TAKE ONE TABLET BY MOUTH TWICE DAILY  . rosuvastatin (CRESTOR) 10 MG tablet TAKE ONE (1) TABLET EACH DAY  . sacubitril-valsartan (ENTRESTO) 97-103 MG Take 1 tablet by mouth 2 (two) times daily.  [name] spironolactone (ALDACTONE) 25 MG tablet Take 0.5 tablets (12.5 mg total) by mouth daily.  [name] terbinafine (LAMISIL) 1 % cream Apply 1 application topically 2 (two) times daily. Apply to both feet and between toes  . torsemide (DEMADEX) 20 MG  tablet Take 40 mg by mouth daily.  [name] 300 UNIT/ML SOPN Inject 15-40 Units into the skin See admin instructions. Inject 40 units in the morning and 15 units at night  . ULTICARE MINI PEN NEEDLES 31G X 6 MM MISC 4 TIMES DAILY   Current Facility-Administered Medications (Other)  Medication Route  . Bevacizumab (AVASTIN) SOLN 1.25 mg Intravitreal  . Bevacizumab (AVASTIN) SOLN 1.25 mg Intravitreal  . Bevacizumab (AVASTIN) SOLN 1.25 mg Intravitreal      REVIEW OF SYSTEMS: ROS    Positive for: Endocrine, Eyes   Negative for: Constitutional, Gastrointestinal, Neurological, Skin, Genitourinary, Musculoskeletal, HENT, Cardiovascular, Respiratory,  Psychiatric, Allergic/Imm, Heme/Lymph   Last edited by Roselee Nova D on 12/21/2017  7:51 AM. (History)       ALLERGIES Allergies  Allergen Reactions  . Sulfa Antibiotics Shortness Of Breath  . Penicillins Rash    Has patient had a PCN reaction causing immediate rash, facial/tongue/throat swelling, SOB or lightheadedness with hypotension: No Has patient had a PCN reaction causing severe rash involving mucus membranes or skin necrosis: No Has patient had a PCN reaction that required hospitalization No Has patient had a PCN reaction occurring within the last 10 years: No If all of the above answers are "NO", then may proceed with Cephalosporin use.     PAST MEDICAL HISTORY Past Medical History:  Diagnosis Date  . Acid reflux   . Back pain    f4 and f5  . CAD (coronary artery disease)   . CHF (congestive heart failure) ([place])    last echo was in March 2016  . Diabetes mellitus 2005  . Diverticulosis   . H/O chest pain 2005   ECHO  . HTN (hypertension)   . Hyperlipidemia   . Nonischemic cardiomyopathy ([place])   . Onychomycosis   . Polysubstance abuse [facility])    Past Surgical History:  Procedure Laterality Date  . APPENDECTOMY    . CARDIAC CATHETERIZATION  2005   4 frech cath  . COLONOSCOPY N/A 08/01/2013   Procedure: COLONOSCOPY;  Surgeon: Rogene Houston, MD;  [facility];  Service: Endoscopy;  Laterality: N/A;  rescheduled to [place] notified pt  . ESOPHAGOGASTRODUODENOSCOPY N/A 04/23/2015   Procedure: ESOPHAGOGASTRODUODENOSCOPY (EGD);  Surgeon: Rogene Houston, MD;  Location: [facility];  Service: Endoscopy;  Laterality: N/A;  1:25  . RIGHT/LEFT HEART CATH AND CORONARY ANGIOGRAPHY N/A 06/03/2016   Procedure: Right/Left Heart Cath and Coronary Angiography;  Surgeon: Belva Crome, MD;  Location: [place] CV LAB;  Service: Cardiovascular;  Laterality: N/A;    FAMILY HISTORY Family History  Problem Relation Age of Onset  . Hypertension Father   . Diabetes  Father   . Cancer Mother   . Alcohol abuse Brother   . Colon cancer Neg Hx     SOCIAL HISTORY Social History   Tobacco Use  . Smoking status: Never Smoker  . Smokeless tobacco: Current User    Types: Snuff  . Tobacco comment: dips snuff about 6 cans/week for 3 years  Substance Use Topics  . Alcohol use: Yes    Alcohol/week: 1.0 standard drinks    Types: 1 Cans of beer per week    Comment: occ - has cut down, now once every 2-3 months  . Drug use: Yes    Types: Marijuana    Comment: occasional use - smoking less         OPHTHALMIC EXAM:  Base Eye Exam    Visual  Acuity (Snellen - Linear)      Right Left   Dist cc 20/25 20/50 -2   Dist ph cc NI 20/50 +2   Correction:  Glasses       Tonometry (Tonopen, 7:59 AM)      Right Left   Pressure 06 08       Pupils      Dark Light Shape React APD   Right 4 3 Round Brisk None   Left 4 3 Round Brisk None       Visual Pultz (Counting fingers)      Left Right    Full Full       Extraocular Movement      Right Left    Full, Ortho Full, Ortho       Neuro/Psych    Oriented x3:  Yes   Mood/Affect:  Normal       Dilation    Both eyes:  1.0% Mydriacyl, 2.5% Phenylephrine @ 7:59 AM        Slit Lamp and Fundus Exam    Slit Lamp Exam      Right Left   Lids/Lashes Dermatochalasis - upper lid Dermatochalasis - upper lid   Conjunctiva/Sclera Melanosis Melanosis   Cornea Arcus, trace inferior and central Punctate epithelial erosions Arcus, trace inferior and central Punctate epithelial erosions, round k scar inferonasal para-central   Anterior Chamber Deep and quiet Deep and quiet   Iris Round and dilated, No NVI Round and dilated, No NVI   Lens 2+ Nuclear sclerosis, 2+ Cortical cataract, Vacuoles 2+ Nuclear sclerosis, 2+ Cortical cataract, Vacuoles   Vitreous Vitreous syneresis, prominent vitreous base nasally  Vitreous syneresis, prominent vitreous base nasally       Fundus Exam      Right Left   Disc Normal  Normal   C/D Ratio 0.4 0.4   Macula Good foveal reflex, scattered Microaneurysms, DBH, puncate exudates, interval decrease in cystic changes ST to fovea, trace ERM Blunted foveal reflex, +persistent edema - mild interval improvement, scattered MAs, scattered DBH, scattered exudates -- improved   Vessels Mild Vascular attenuation Mild Vascular attenuation   Periphery Attached, scattered MAs, few CWS posteriorly -- improved, scattered White without pressure Attached, scattered DBH, scattered CWS -- improved, scattered White without pressure        Refraction    Wearing Rx      Sphere Cylinder Axis Add   Right -0.25 +1.00 019 +2.50   Left -0.25 +1.00 153 +2.50          IMAGING AND PROCEDURES  Imaging and Procedures for 05/31/17  OCT, Retina - OU - Both Eyes       Right Eye Quality was good. Central Foveal Thickness: 281. Progression has improved. Findings include normal foveal contour, no SRF, intraretinal fluid (Interval decrease in IRF).   Left Eye Quality was good. Central Foveal Thickness: 261. Progression has improved. Findings include normal foveal contour, intraretinal fluid, no SRF (Interval improvement in noncentral IRF).   Notes *Images captured and stored on drive  Diagnosis / Impression:  DME OU, OS>OD Mild improvement in persistent IRF OD Mild interval improvement in noncentral IRF OS  Clinical management:  See below  Abbreviations: NFP - Normal foveal profile. CME - cystoid macular edema. PED - pigment epithelial detachment. IRF - intraretinal fluid. SRF - subretinal fluid. EZ - ellipsoid zone. ERM - epiretinal membrane. ORA - outer retinal atrophy. ORT - outer retinal tubulation. SRHM - subretinal hyper-reflective material  Intravitreal Injection, Pharmacologic Agent - OS - Left Eye       Time Out 12/21/2017. 9:08 AM. Confirmed correct patient, procedure, site, and patient consented.   Anesthesia Topical anesthesia was used. Anesthetic  medications included Lidocaine 2%, Proparacaine 0.5%.   Procedure Preparation included 5% betadine to ocular surface, eyelid speculum. A 30 gauge needle was used.   Injection:  2 mg aflibercept 2 MG/0.05ML   NDC: 61755-005-02, Lot: [id number], Expiration date: 11/21/2018   Route: Intravitreal, Site: Left Eye, Waste: 0.05 mL  Post-op Post injection exam found visual acuity of at least counting fingers. The patient tolerated the procedure well. There were no complications. The patient received written and verbal post procedure care education.                 ASSESSMENT/PLAN:    ICD-10-CM   1. Moderate nonproliferative diabetic retinopathy of both eyes with macular edema associated with type 2 diabetes mellitus (HCC) Z22.4825 Intravitreal Injection, Pharmacologic Agent - OS - Left Eye    aflibercept (EYLEA) SOLN 2 mg  2. Retinal edema H35.81 OCT, Retina - OU - Both Eyes  3. Essential hypertension I10   4. Hypertensive retinopathy of both eyes H35.033   5. Combined forms of age-related cataract of both eyes H25.813     1,2. Moderate to severe non-proliferative diabetic retinopathy, OU - initial exam showed scattered IRH, exudates, and cotton wool spots, OS > OD, no NV - FA 4.10.19 shows no NV OU - S/P IVA OS #1 (05.29.19), #2 (06.26.19), #3 (07.29.19) - S/P IVE OS #1 (08.30.19), #2 (10.01.19) - OCT shows interval improvement in diabetic macular edema OU -- good response to Eylea - BCVA stable at 20/50 OS - recommend IVE #3 OS today (10.31.19) - pt wishes to proceed - RBA of procedure discussed, questions answered - informed consent obtained and signed - see procedure note - Eylea4U paper work and benefits investigation started on 07.29.19 -- Approved for Good Days - f/u in 4 weeks  3,4. Hypertensive retinopathy OU - discussed importance of tight BP control - monitor  5. Combined form age-related cataract OU-  - The symptoms of cataract, surgical options, and treatments  and risks were discussed with patient. - discussed diagnosis and progression - not yet visually significant - monitor for now   Ophthalmic Meds Ordered this visit:  Meds ordered this encounter  Medications  . aflibercept (EYLEA) SOLN 2 mg       Return in about 4 weeks (around 01/18/2018) for F/U NPDR OU, DFE, OCT.  There are no Patient Instructions on file for this visit.   Explained the diagnoses, plan, and follow up with the patient and they expressed understanding.  Patient expressed understanding of the importance of proper follow up care.   This document serves as a record of services personally performed by Gardiner Sleeper, MD, PhD. It was created on their behalf by Ernest Mallick, OA, an ophthalmic assistant. The creation of this record is the provider's dictation and/or activities during the visit.    Electronically signed by: Ernest Mallick, OA  10.29.19 9:33 AM     Gardiner Sleeper, M.D., Ph.D. Diseases & Surgery of the Retina and Vitreous [facility]   I have reviewed the above documentation for accuracy and completeness, and I agree with the above. Gardiner Sleeper, M.D., Ph.D. 12/21/17 9:36 AM    Abbreviations: M myopia (nearsighted); A astigmatism; H hyperopia (farsighted); P presbyopia; Mrx spectacle prescription;  CTL contact  lenses; OD right eye; OS left eye; OU both eyes  XT exotropia; ET esotropia; PEK punctate epithelial keratitis; PEE punctate epithelial erosions; DES dry eye syndrome; MGD meibomian gland dysfunction; ATs artificial tears; PFAT's preservative free artificial tears; [place] nuclear sclerotic cataract; PSC posterior subcapsular cataract; ERM epi-retinal membrane; PVD posterior vitreous detachment; RD retinal detachment; DM diabetes mellitus; DR diabetic retinopathy; NPDR non-proliferative diabetic retinopathy; PDR proliferative diabetic retinopathy; CSME clinically significant macular edema; DME diabetic macular edema; dbh dot blot  hemorrhages; CWS cotton wool spot; POAG primary open angle glaucoma; C/D cup-to-disc ratio; HVF humphrey visual field; GVF goldmann visual field; OCT optical coherence tomography; IOP intraocular pressure; BRVO Branch retinal vein occlusion; CRVO central retinal vein occlusion; CRAO central retinal artery occlusion; BRAO branch retinal artery occlusion; RT retinal tear; SB scleral buckle; PPV pars plana vitrectomy; VH Vitreous hemorrhage; PRP panretinal laser photocoagulation; IVK intravitreal kenalog; VMT vitreomacular traction; MH Macular hole;  NVD neovascularization of the disc; NVE neovascularization elsewhere; AREDS age related eye disease study; ARMD age related macular degeneration; POAG primary open angle glaucoma; EBMD epithelial/anterior basement membrane dystrophy; ACIOL anterior chamber intraocular lens; IOL intraocular lens; PCIOL posterior chamber intraocular lens; Phaco/IOL phacoemulsification with intraocular lens placement; [place] photorefractive keratectomy; LASIK laser assisted in situ keratomileusis; HTN hypertension; DM diabetes mellitus; COPD chronic obstructive pulmonary disease

## 2017-12-20 ENCOUNTER — Other Ambulatory Visit: Payer: Self-pay

## 2017-12-20 NOTE — Patient Outreach (Signed)
[facility]) Care Management  12/20/2017   Brad Mendoza September 10, 1955  [id number]  Subjective: Received call from the patient for assessment.  HIPAA verified.  The patient states that he had a visit with his cardiologist on 12/08/17.  He had been experiencing some chest pain and dizziness. After his visit he was told to decrease his torsemide from 40 mg a day to 20 mg a day. Reinforcement was made fluid restriction to < 2 L daily, sodium restriction to less than 2000 mg daily, and the importance of daily weights.  The patient was also started on Jardiance.   The patient denies any chest pain, dizziness, swelling or falls.  He has not checked his weight because he states that he needs to get a battery for his scale.  He did not check his blood sugar today.  He states that he had to go out and for got to check it.  Discussed with the patient the importance of checking his weight and blood sugars daily.  Patient verbalized understanding.  The patient states that he is monitoring his diet and taking his medications as prescribed.  He needs to call and schedule an appointment with his PCP and will get his flu shot then.  He has an appointment to see his cardiologist in three months.   Current Medications:  Current Outpatient Medications  Medication Sig Dispense Refill  . albuterol (PROVENTIL HFA;VENTOLIN HFA) 108 (90 Base) MCG/ACT inhaler Inhale 2 puffs into the lungs every 6 (six) hours as needed for wheezing or shortness of breath. 1 Inhaler 2  . aspirin EC 81 MG tablet Take 81 mg by mouth daily.    . Capsaicin (ICY HOT ARTHRITIS THERAPY EX) Apply 1 application topically 2 (two) times daily as needed (back pain).     . carvedilol (COREG) 12.5 MG tablet TAKE ONE TABLET TWICE A DAY WITH FOOD 180 tablet 1  . empagliflozin (JARDIANCE) 10 MG TABS tablet Take 10 mg by mouth daily. 30 tablet 6  . Ergocalciferol (VITAMIN D2) 400 units TABS Take 1 tablet by mouth daily.    . fluticasone  (FLONASE) 50 MCG/ACT nasal spray USE 2 SPRAYS IN EACH NOSTRIL DAILY 48 g 1  . hydrALAZINE (APRESOLINE) 50 MG tablet Take 1 tablet (50 mg total) by mouth 3 (three) times daily. 90 tablet 11  . isosorbide mononitrate (IMDUR) 60 MG 24 hr tablet Take 1.5 tablets (90 mg total) by mouth daily. 135 tablet 1  . metFORMIN (GLUCOPHAGE-XR) 500 MG 24 hr tablet Take 1 tablet (500 mg total) by mouth daily with breakfast. 90 tablet 1  . Multiple Vitamins-Minerals (CENTRUM SILVER ADULT 50+ PO) Take 1 tablet by mouth daily.    . nitroGLYCERIN (NITROSTAT) 0.4 MG SL tablet DISSOLVE 1 TAB UNDER TOUNGE FOR CHEST PAIN. MAY REPEAT EVERY 5 MINUTES FOR 3 DOSES. IF NO RELIEF CALL 911 OR GO TO ER 25 tablet 2  . Omega-3 Fatty Acids (FISH OIL) 1000 MG CPDR Take 1 capsule by mouth daily.    . pantoprazole (PROTONIX) 40 MG tablet TAKE ONE (1) TABLET EACH DAY 90 tablet 1  . ranolazine (RANEXA) 500 MG 12 hr tablet TAKE ONE TABLET BY MOUTH TWICE DAILY 60 tablet 5  . rosuvastatin (CRESTOR) 10 MG tablet TAKE ONE (1) TABLET EACH DAY 90 tablet 1  . sacubitril-valsartan (ENTRESTO) 97-103 MG Take 1 tablet by mouth 2 (two) times daily. 180 tablet 1  . spironolactone (ALDACTONE) 25 MG tablet Take 0.5 tablets (12.5 mg total) by  mouth daily. 45 tablet 3  . terbinafine (LAMISIL) 1 % cream Apply 1 application topically 2 (two) times daily. Apply to both feet and between toes 30 g 1  . torsemide (DEMADEX) 20 MG tablet Take 40 mg by mouth daily.    [name] 300 UNIT/ML SOPN Inject 15-40 Units into the skin See admin instructions. Inject 40 units in the morning and 15 units at night    . ULTICARE MINI PEN NEEDLES 31G X 6 MM MISC 4 TIMES DAILY 100 each 2   Current Facility-Administered Medications  Medication Dose Route Frequency Provider Last Rate Last Dose  . aflibercept (EYLEA) SOLN 2 mg  2 mg Intravitreal  [name], MD   2 mg at 10/20/17 0858  . aflibercept (EYLEA) SOLN 2 mg  2 mg Intravitreal  [name], MD   2 mg at  11/21/17 1214  . Bevacizumab (AVASTIN) SOLN 1.25 mg  1.25 mg Intravitreal  [name], MD   1.25 mg at 07/19/17 0849  . Bevacizumab (AVASTIN) SOLN 1.25 mg  1.25 mg Intravitreal  [name], MD   1.25 mg at 08/16/17 1232  . Bevacizumab (AVASTIN) SOLN 1.25 mg  1.25 mg Intravitreal  [name], MD   1.25 mg at 09/18/17 1024    Functional Status:  No flowsheet data found.  Fall/Depression Screening: Fall Risk  12/20/2017 11/20/2017 10/17/2017  Falls in the past year? No No No  Comment - - -  Number falls in past yr: - - -  Injury with Fall? - - -  Comment - - -  Risk for fall due to : - - -  Follow up - - -   PHQ 2/9 Scores 12/14/2016 12/02/2016 09/26/2016 09/14/2016 09/01/2016 08/22/2016 07/07/2016  PHQ - 2 Score 1 0 0 0 0 0 0    Assessment: Patient will continue to benefit from health coach outreach for disease management and support.  THN CM Care Plan Problem One     Most Recent Value  THN Long Term Goal   In 30 days the patient will verbalize he has not been seen in the hospital  Interventions for Problem One Long Term Goal  Talked with the patient about diet , medication adherence, and the importance of weighing daily.      Plan: RN Health Coach will contact patient in the month of November and patient agrees to next outreach.  [name] RN, BSN, [place] Direct Dial:  [phone]  Fax: [phone]

## 2017-12-20 NOTE — Patient Outreach (Signed)
[place]) Care Management  12/20/2017  Brad Mendoza [birth year] [id number]    1st unsuccessful telephone call to the patient for assessment.  No answer.  Unable to leave a message due to the mailbox is full.  Plan: RN Health Coach will make another outreach attempt to the patient within thirty business days. RN Health Coach will send a letter.  [name] RN, BSN, [place] Direct Dial:  [phone]  Fax: [phone]

## 2017-12-21 ENCOUNTER — Ambulatory Visit (INDEPENDENT_AMBULATORY_CARE_PROVIDER_SITE_OTHER): Payer: Medicare Other | Admitting: Ophthalmology

## 2017-12-21 ENCOUNTER — Encounter (INDEPENDENT_AMBULATORY_CARE_PROVIDER_SITE_OTHER): Payer: Self-pay | Admitting: Ophthalmology

## 2017-12-21 DIAGNOSIS — H25813 Combined forms of age-related cataract, bilateral: Secondary | ICD-10-CM

## 2017-12-21 DIAGNOSIS — I10 Essential (primary) hypertension: Secondary | ICD-10-CM | POA: Diagnosis not present

## 2017-12-21 DIAGNOSIS — H3581 Retinal edema: Secondary | ICD-10-CM | POA: Diagnosis not present

## 2017-12-21 DIAGNOSIS — E113313 Type 2 diabetes mellitus with moderate nonproliferative diabetic retinopathy with macular edema, bilateral: Secondary | ICD-10-CM

## 2017-12-21 DIAGNOSIS — H35033 Hypertensive retinopathy, bilateral: Secondary | ICD-10-CM

## 2017-12-21 MED ORDER — AFLIBERCEPT 2MG/0.05ML IZ SOLN FOR KALEIDOSCOPE
2.0000 mg | INTRAVITREAL | Status: DC
  Administered 2017-12-21: 2 mg via INTRAVITREAL

## 2017-12-29 ENCOUNTER — Ambulatory Visit: Payer: Medicare Other | Attending: Internal Medicine | Admitting: Cardiology

## 2017-12-29 DIAGNOSIS — R0683 Snoring: Secondary | ICD-10-CM | POA: Diagnosis not present

## 2017-12-29 DIAGNOSIS — I5042 Chronic combined systolic (congestive) and diastolic (congestive) heart failure: Secondary | ICD-10-CM | POA: Insufficient documentation

## 2017-12-29 DIAGNOSIS — G4733 Obstructive sleep apnea (adult) (pediatric): Secondary | ICD-10-CM

## 2018-01-02 NOTE — Procedures (Signed)
   Patient Name: Brad Mendoza, Brad Mendoza Date: 12/30/2017 Gender: Male D.O.B: 09/27/1935 Age (years): 5 Referring Provider: Loralie Champagne Height (inches): 1 Interpreting Physician: Fransico Him MD, ABSM Weight (lbs): 195 RPSGT: Jacolyn Reedy BMI: 28 MRN: [id number] Neck Size: 16.00  CLINICAL INFORMATION  Sleep Study Type: HST  Indication for sleep study: OSA  Epworth Sleepiness Score: 8  SLEEP STUDY TECHNIQUE   A multi-channel overnight portable sleep study was performed. The channels recorded were: nasal airflow, thoracic respiratory movement, and oxygen saturation with a pulse oximetry. Snoring was also monitored.  MEDICATIONS  Patient self administered medications include: N/A.  SLEEP ARCHITECTURE  Patient was studied for 469.3 minutes. The sleep efficiency was 100.0 % and the patient was supine for 23.4%. The arousal index was 0.0 per hour.  RESPIRATORY PARAMETERS  The overall AHI was 14.4 per hour, with a central apnea index of 0.4 per hour. The oxygen nadir was 79% during sleep.  CARDIAC DATA  Mean heart rate during sleep was 67.3 bpm.  IMPRESSIONS  - Mild obstructive sleep apnea occurred during this study (AHI = 14.4/h). - No significant central sleep apnea occurred during this study (CAI = 0.4/h). - Severe oxygen desaturation was noted during this study (Min O2 = 79%). - Patient snored 14.5% during the sleep. DIAGNOSIS  - Obstructive Sleep Apnea (327.23 [G47.33 ICD-10]) - Nocturnal Hypoxemia (327.26 [G47.36 ICD-10])  RECOMMENDATIONS  - Therapeutic CPAP titration to determine optimal pressure required to alleviate sleep disordered breathing. - Positional therapy avoiding supine position during sleep. - Avoid alcohol, sedatives and other CNS depressants that may worsen sleep apnea and disrupt normal sleep architecture. - Sleep hygiene should be reviewed to assess factors that may improve sleep quality. - Weight management and regular exercise should be  initiated or continued.  [Electronically signed] 01/02/2018 07:34 PM  Fransico Him MD, ABSM Diplomate, American Board of Sleep Medicine

## 2018-01-09 ENCOUNTER — Telehealth: Payer: Self-pay | Admitting: *Deleted

## 2018-01-09 DIAGNOSIS — G4733 Obstructive sleep apnea (adult) (pediatric): Secondary | ICD-10-CM

## 2018-01-09 NOTE — Telephone Encounter (Signed)
Called results but no vm set up

## 2018-01-09 NOTE — Telephone Encounter (Signed)
-----   Message from Sueanne Margarita, MD sent at 01/02/2018  7:37 PM EST ----- Please let patient know that they have sleep apnea and recommend CPAP titration. Please set up titration in the sleep lab.

## 2018-01-10 ENCOUNTER — Other Ambulatory Visit: Payer: Self-pay | Admitting: Pediatrics

## 2018-01-10 NOTE — Telephone Encounter (Signed)
Informed patient of sleep study results and patient understanding was verbalized. Patient understands his sleep study showed they have sleep apnea and recommend CPAP titration.  Pt is aware and agreeable to these results   Titration sent to precert

## 2018-01-11 ENCOUNTER — Telehealth: Payer: Self-pay | Admitting: *Deleted

## 2018-01-11 NOTE — Telephone Encounter (Signed)
-----   Message from Freada Bergeron, [place] sent at 01/10/2018 10:15 AM EST ----- Regarding: precert cpap titration

## 2018-01-11 NOTE — Telephone Encounter (Signed)
Staff message sent to Brad Mendoza per [facility] web portal no PA is required. Decision [id number].

## 2018-01-16 ENCOUNTER — Ambulatory Visit: Payer: Medicare Other | Admitting: Family Medicine

## 2018-01-16 DIAGNOSIS — N183 Chronic kidney disease, stage 3 (moderate): Secondary | ICD-10-CM | POA: Diagnosis not present

## 2018-01-16 DIAGNOSIS — E559 Vitamin D deficiency, unspecified: Secondary | ICD-10-CM | POA: Diagnosis not present

## 2018-01-16 DIAGNOSIS — D649 Anemia, unspecified: Secondary | ICD-10-CM | POA: Diagnosis not present

## 2018-01-16 DIAGNOSIS — R601 Generalized edema: Secondary | ICD-10-CM | POA: Diagnosis not present

## 2018-01-16 DIAGNOSIS — E1129 Type 2 diabetes mellitus with other diabetic kidney complication: Secondary | ICD-10-CM | POA: Diagnosis not present

## 2018-01-16 DIAGNOSIS — R809 Proteinuria, unspecified: Secondary | ICD-10-CM | POA: Diagnosis not present

## 2018-01-17 ENCOUNTER — Encounter: Payer: Self-pay | Admitting: *Deleted

## 2018-01-17 ENCOUNTER — Other Ambulatory Visit: Payer: Self-pay

## 2018-01-17 NOTE — Telephone Encounter (Addendum)
Your CPAP TITRATION has been scheduled for 01/22/18 at 8:00 PM. You will need to report to the Emergency Department at [facility]. Their address is [place], [place] [address]. If you need to reschedule this appointment, you can call their office at [phone].    Please call me at [phone] if you have any questions or concerns. Once your appointment at the sleep center is completed, I will contact you regarding your next step.    Sincerely,  Gershon Cull, CMA - Sleep Coordinator

## 2018-01-17 NOTE — Telephone Encounter (Signed)
  Lauralee Evener, CMA  Freada Bergeron, [place]        Per [facility] web portal no PA required. Decision AR:[id number]. ( THIS PATIENT CAN BE SCHEDULED AT [facility]) If they want to. He lives in [place].     ----- Message -----  From: Freada Bergeron, CMA  Sent: 01/10/2018 10:15 AM EST  To: Cv Div Sleep Studies  Subject: precert                      cpap titration

## 2018-01-17 NOTE — Telephone Encounter (Signed)
Patient agrees with treatment and thanked me for call.

## 2018-01-17 NOTE — Patient Outreach (Signed)
[facility]) Care Management  01/17/2018  AVIRAJ KENTNER [birth year] [id number]    1st unsuccessful attempt to outreach the patient for assessment. No answer.  Unable to leave voicemail because voicemail has not been set up.  Plan: [place] will make outreach attempt the patient within in one month.   [name] RN, BSN, [place] Direct Dial:  [phone]  Fax: [phone]

## 2018-01-19 ENCOUNTER — Ambulatory Visit: Payer: Self-pay

## 2018-01-21 ENCOUNTER — Emergency Department (HOSPITAL_COMMUNITY)
Admission: EM | Admit: 2018-01-21 | Discharge: 2018-01-21 | Disposition: A | Discharge status: Home / Self Care | Payer: Medicare Other | Attending: Emergency Medicine | Admitting: Emergency Medicine

## 2018-01-21 ENCOUNTER — Encounter (HOSPITAL_COMMUNITY): Payer: Self-pay | Admitting: *Deleted

## 2018-01-21 ENCOUNTER — Emergency Department (HOSPITAL_COMMUNITY): Payer: Medicare Other

## 2018-01-21 DIAGNOSIS — E1100 Type 2 diabetes mellitus with hyperosmolarity without nonketotic hyperglycemic-hyperosmolar coma (NKHHC): Secondary | ICD-10-CM | POA: Diagnosis not present

## 2018-01-21 DIAGNOSIS — R69 Illness, unspecified: Secondary | ICD-10-CM

## 2018-01-21 DIAGNOSIS — R509 Fever, unspecified: Secondary | ICD-10-CM | POA: Diagnosis present

## 2018-01-21 DIAGNOSIS — Z7982 Long term (current) use of aspirin: Secondary | ICD-10-CM | POA: Diagnosis not present

## 2018-01-21 DIAGNOSIS — Z79899 Other long term (current) drug therapy: Secondary | ICD-10-CM | POA: Diagnosis not present

## 2018-01-21 DIAGNOSIS — I5042 Chronic combined systolic (congestive) and diastolic (congestive) heart failure: Secondary | ICD-10-CM | POA: Insufficient documentation

## 2018-01-21 DIAGNOSIS — J111 Influenza due to unidentified influenza virus with other respiratory manifestations: Secondary | ICD-10-CM | POA: Diagnosis not present

## 2018-01-21 DIAGNOSIS — Z7984 Long term (current) use of oral hypoglycemic drugs: Secondary | ICD-10-CM | POA: Insufficient documentation

## 2018-01-21 DIAGNOSIS — I2510 Atherosclerotic heart disease of native coronary artery without angina pectoris: Secondary | ICD-10-CM | POA: Insufficient documentation

## 2018-01-21 DIAGNOSIS — E1142 Type 2 diabetes mellitus with diabetic polyneuropathy: Secondary | ICD-10-CM | POA: Diagnosis not present

## 2018-01-21 DIAGNOSIS — I110 Hypertensive heart disease with heart failure: Secondary | ICD-10-CM | POA: Diagnosis not present

## 2018-01-21 MED ORDER — PREDNISONE 20 MG PO TABS
40.0000 mg | ORAL_TABLET | Freq: Once | ORAL | Status: AC
  Administered 2018-01-21: 40 mg via ORAL
  Filled 2018-01-21: qty 2

## 2018-01-21 MED ORDER — LORATADINE-PSEUDOEPHEDRINE ER 5-120 MG PO TB12: 1.0000 | ORAL_TABLET | Freq: Two times a day (BID) | ORAL | 0 refills | Status: DC

## 2018-01-21 MED ORDER — PROMETHAZINE-DM 6.25-15 MG/5ML PO SYRP: 5.0000 mL | ORAL_SOLUTION | Freq: Four times a day (QID) | ORAL | 0 refills | Status: DC | PRN

## 2018-01-21 MED ORDER — PSEUDOEPHEDRINE HCL 60 MG PO TABS
60.0000 mg | ORAL_TABLET | Freq: Once | ORAL | Status: AC
  Administered 2018-01-21: 60 mg via ORAL
  Filled 2018-01-21: qty 1

## 2018-01-21 MED ORDER — DEXAMETHASONE 4 MG PO TABS: 4.0000 mg | ORAL_TABLET | Freq: Two times a day (BID) | ORAL | 0 refills | Status: DC

## 2018-01-21 MED ORDER — HYDROCOD POLST-CPM POLST ER 10-8 MG/5ML PO SUER
5.0000 mL | Freq: Once | ORAL | Status: AC
  Administered 2018-01-21: 5 mL via ORAL
  Filled 2018-01-21: qty 5

## 2018-01-21 MED ORDER — ACETAMINOPHEN 500 MG PO TABS
1000.0000 mg | ORAL_TABLET | Freq: Once | ORAL | Status: AC
  Administered 2018-01-21: 1000 mg via ORAL
  Filled 2018-01-21: qty 2

## 2018-01-21 NOTE — ED Provider Notes (Signed)
[facility] EMERGENCY DEPARTMENT Provider Note   CSN: [id number] Arrival date & time: 01/21/18  1750     History   Chief Complaint Chief Complaint  Patient presents with  . URI    HPI Brad Mendoza is a 62 y.o. male.  The history is provided by the patient.  URI   This is a new problem. The current episode started more than 2 days ago. The problem has been gradually worsening. Maximum temperature: subjective fever. The fever has been present for 1 to 2 days. Associated symptoms include nausea, congestion, headaches, sinus pain, sneezing and cough. Pertinent negatives include no chest pain, no abdominal pain, no diarrhea, no vomiting, no dysuria, no neck pain, no rash and no wheezing. Treatments tried: OTC cold medications.    Past Medical History:  Diagnosis Date  . Acid reflux   . Back pain    f4 and f5  . CAD (coronary artery disease)   . CHF (congestive heart failure) ([place])    last echo was in March 2016  . Diabetes mellitus 2005  . Diverticulosis   . H/O chest pain 2005   ECHO  . HTN (hypertension)   . Hyperlipidemia   . Nonischemic cardiomyopathy ([place])   . Onychomycosis   . Polysubstance abuse [facility])     Patient Active Problem List   Diagnosis Date Noted  . 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 Anginal pain ([place]) 06/01/2016  . CHF exacerbation ([place]) 11/17/2015  . Chronic combined systolic and diastolic heart failure ([place]) 11/16/2015  . Type 2 diabetes mellitus with hyperosmolar nonketotic hyperglycemia ([place]) 05/08/2015  . Hyponatremia 05/08/2015  . Hypochloremia 05/08/2015  . Type 2 diabetes mellitus with diabetic polyneuropathy ([place]) 12/16/2013  . CAD (coronary artery disease) 09/18/2013  . Heme positive stool 06/12/2013  . Seasonal allergic rhinitis 07/11/2012  . Polysubstance abuse ([place]) 12/24/2010  . Onychomycosis   . Acid reflux   . Nonischemic cardiomyopathy ([place])   . Type 2 diabetes mellitus with complications ([place])   . Hyperlipemia 11/16/2009  . Obesity  11/16/2009  . Tobacco dependence 11/16/2009  . Essential hypertension 11/16/2009    Past Surgical History:  Procedure Laterality Date  . APPENDECTOMY    . CARDIAC CATHETERIZATION  2005   4 frech cath  . COLONOSCOPY N/A 08/01/2013   Procedure: COLONOSCOPY;  Surgeon: Rogene Houston, MD;  Location: [facility] ENDO SUITE;  Service: Endoscopy;  Laterality: N/A;  rescheduled to [place] notified pt  . ESOPHAGOGASTRODUODENOSCOPY N/A 04/23/2015   Procedure: ESOPHAGOGASTRODUODENOSCOPY (EGD);  Surgeon: Rogene Houston, MD;  Location: [facility] ENDO SUITE;  Service: Endoscopy;  Laterality: N/A;  1:25  . RIGHT/LEFT HEART CATH AND CORONARY ANGIOGRAPHY N/A 06/03/2016   Procedure: Right/Left Heart Cath and Coronary Angiography;  Surgeon: Belva Crome, MD;  Location: [facility] CV LAB;  Service: Cardiovascular;  Laterality: N/A;        Home Medications    Prior to Admission medications   Medication Sig Start Date End Date Taking? Authorizing Provider  albuterol (PROVENTIL HFA;VENTOLIN HFA) 108 (90 Base) MCG/ACT inhaler Inhale 2 puffs into the lungs every 6 (six) hours as needed for wheezing or shortness of breath. 05/21/15   [name], MD  aspirin EC 81 MG tablet Take 81 mg by mouth daily.    [provider]  Capsaicin (ICY HOT ARTHRITIS THERAPY EX) Apply 1 application topically 2 (two) times daily as needed (back pain).     [provider]  carvedilol (COREG) 12.5 MG tablet TAKE ONE  TABLET TWICE A DAY WITH FOOD 12/02/16   [name], MD  empagliflozin (JARDIANCE) 10 MG TABS tablet Take 10 mg by mouth daily. 12/08/17   Bensimhon, Shaune Pascal, MD  Ergocalciferol (VITAMIN D2) 400 units TABS Take 1 tablet by mouth daily.    [provider]  fluticasone Asencion Islam) 50 MCG/ACT nasal spray USE 2 SPRAYS IN EACH NOSTRIL DAILY 09/07/16   [name], MD  hydrALAZINE (APRESOLINE) 50 MG tablet Take 1 tablet (50 mg total) by mouth 3 (three) times daily. 03/30/17   Shirley Friar,  PA-C  isosorbide mononitrate (IMDUR) 60 MG 24 hr tablet Take 1.5 tablets (90 mg total) by mouth daily. 12/02/16   [name], MD  metFORMIN (GLUCOPHAGE-XR) 500 MG 24 hr tablet Take 1 tablet (500 mg total) by mouth daily with breakfast. 12/02/16   [name], MD  Multiple Vitamins-Minerals (CENTRUM SILVER ADULT 50+ PO) Take 1 tablet by mouth daily.    [provider]  nitroGLYCERIN (NITROSTAT) 0.4 MG SL tablet DISSOLVE 1 TAB UNDER TOUNGE FOR CHEST PAIN. MAY REPEAT EVERY 5 MINUTES FOR 3 DOSES. IF NO RELIEF CALL 911 OR GO TO ER 12/10/15   [name], MD  Omega-3 Fatty Acids (FISH OIL) 1000 MG CPDR Take 1 capsule by mouth daily.    [provider]  pantoprazole (PROTONIX) 40 MG tablet TAKE ONE (1) TABLET EACH DAY 12/02/16   [name], MD  ranolazine (RANEXA) 500 MG 12 hr tablet TAKE ONE TABLET BY MOUTH TWICE DAILY 11/20/17   Bensimhon, Shaune Pascal, MD  rosuvastatin (CRESTOR) 10 MG tablet TAKE ONE (1) TABLET EACH DAY 12/02/16   [name], MD  sacubitril-valsartan (ENTRESTO) 97-103 MG Take 1 tablet by mouth 2 (two) times daily. 11/29/17   Bensimhon, Shaune Pascal, MD  spironolactone (ALDACTONE) 25 MG tablet Take 0.5 tablets (12.5 mg total) by mouth daily. 12/23/16   Shirley Friar, PA-C  terbinafine (LAMISIL) 1 % cream Apply 1 application topically 2 (two) times daily. Apply to both feet and between toes 03/28/16   Eckard, Tammy, PharmD  torsemide (DEMADEX) 20 MG tablet Take 40 mg by mouth daily.    [provider]  TOUJEO SOLOSTAR 300 UNIT/ML SOPN Inject 15-40 Units into the skin See admin instructions. Inject 40 units in the morning and 15 units at night 09/07/16   [provider]  ULTICARE MINI PEN NEEDLES 31G X 6 MM MISC 4 TIMES DAILY 11/28/16   [name], MD    Family History Family History  Problem Relation Age of Onset  . Hypertension Father   . Diabetes Father   . Cancer Mother   . Alcohol abuse Brother   . Colon cancer  Neg Hx     Social History Social History   Tobacco Use  . Smoking status: Never Smoker  . Smokeless tobacco: Current User    Types: Snuff  . Tobacco comment: dips snuff about 6 cans/week for 3 years  Substance Use Topics  . Alcohol use: Yes    Alcohol/week: 1.0 standard drinks    Types: 1 Cans of beer per week    Comment: occ - has cut down, now once every 2-3 months  . Drug use: Yes    Types: Marijuana    Comment: occasional use - smoking less     Allergies   Sulfa antibiotics and Penicillins   Review of Systems Review of Systems  Constitutional: Positive for chills and fever. Negative for activity  change.       All ROS Neg except as noted in HPI  HENT: Positive for congestion, sinus pain and sneezing. Negative for nosebleeds.   Eyes: Negative for photophobia and discharge.  Respiratory: Positive for cough. Negative for shortness of breath and wheezing.   Cardiovascular: Negative for chest pain and palpitations.  Gastrointestinal: Positive for nausea. Negative for abdominal pain, blood in stool, diarrhea and vomiting.  Genitourinary: Negative for dysuria, frequency and hematuria.  Musculoskeletal: Negative for arthralgias, back pain and neck pain.  Skin: Negative.  Negative for rash.  Neurological: Positive for headaches. Negative for dizziness, seizures and speech difficulty.  Psychiatric/Behavioral: Negative for confusion and hallucinations.     Physical Exam Updated Vital Signs BP (!) 151/76 (BP Location: Right Arm)   Pulse 89   Temp 98.6 F (37 C) (Oral)   Resp (!) 22   Ht 5\' 9"  (1.753 m)   Wt 103.9 kg   SpO2 91%   BMI 33.82 kg/m   Physical Exam  Constitutional: He is oriented to person, place, and time. He appears well-developed and well-nourished.  Non-toxic appearance.  HENT:  Head: Normocephalic.  Right Ear: Tympanic membrane and external ear normal.  Left Ear: Tympanic membrane and external ear normal.  Nasal congestion present.  Eyes: Pupils  are equal, round, and reactive to light. EOM and lids are normal.  Neck: Normal range of motion. Neck supple. Carotid bruit is not present.  Cardiovascular: Normal rate, regular rhythm, normal heart sounds, intact distal pulses and normal pulses.  Pulmonary/Chest: Breath sounds normal. No respiratory distress.  Patient speaks in complete sentences without problem.  There is symmetrical rise and fall of the chest.  Abdominal: Soft. Bowel sounds are normal. There is no tenderness. There is no guarding.  Musculoskeletal: Normal range of motion.  Lymphadenopathy:       Head (right side): No submandibular adenopathy present.       Head (left side): No submandibular adenopathy present.    He has no cervical adenopathy.  Neurological: He is alert and oriented to person, place, and time. He has normal strength. No cranial nerve deficit or sensory deficit. Coordination normal.  Skin: Skin is warm and dry. No rash noted.  Psychiatric: He has a normal mood and affect. His speech is normal.  Nursing note and vitals reviewed.    ED Treatments / Results  Labs (all labs ordered are listed, but only abnormal results are displayed) Labs Reviewed - No data to display  EKG None  Radiology Dg Chest 2 View  Result Date: 01/21/2018 CLINICAL DATA:  Cough and flu-like symptoms. EXAM: CHEST - 2 VIEW COMPARISON:  03/08/2017 FINDINGS: Lungs are adequately inflated without focal airspace consolidation or effusion. Mild stable cardiomegaly. Mild degenerate change of the spine. IMPRESSION: No active cardiopulmonary disease. Electronically Signed   By: Marin Olp M.D.   On: 01/21/2018 18:48    Procedures Procedures (including critical care time)  Medications Ordered in ED Medications  chlorpheniramine-HYDROcodone (TUSSIONEX) 10-8 MG/5ML suspension 5 mL (has no administration in time range)  acetaminophen (TYLENOL) tablet 1,000 mg (has no administration in time range)  predniSONE (DELTASONE) tablet 40 mg  (has no administration in time range)  pseudoephedrine (SUDAFED) tablet 60 mg (has no administration in time range)     Initial Impression / Assessment and Plan / ED Course  I have reviewed the triage vital signs and the nursing notes.  Pertinent labs & imaging results that were available during my care of the patient were  reviewed by me and considered in my medical decision making (see chart for details).       Final Clinical Impressions(s) / ED Diagnoses MDM  Vital signs reviewed.  Pulse oximetry is 91% on room air on admission to the emergency department.  Patient has influenza-like illness.  The chest x-ray is negative for pneumonia or acute changes.  Recheck of the pulse ox 95% on room air at d/c.  Patient to see Dr. Evette Doffing to return to the emergency department if any changes in his condition, problems, or concerns.  Prescriptions for Claritin-D and Decadron 2 times daily, Tylenol extra strength every 4 hours for fever, or aching.  Promethazine DM for cough.   Final diagnoses:  Influenza-like illness    ED Discharge Orders         Ordered    loratadine-pseudoephedrine (CLARITIN-D 12 HOUR) 5-120 MG tablet  2 times daily     01/21/18 2008    promethazine-dextromethorphan (PROMETHAZINE-DM) 6.25-15 MG/5ML syrup  4 times daily PRN     01/21/18 2008    dexamethasone (DECADRON) 4 MG tablet  2 times daily with meals     01/21/18 2008           Lily Kocher, PA-C 01/21/18 2016    Sherwood Gambler, MD 01/21/18 2312

## 2018-01-21 NOTE — ED Triage Notes (Signed)
Pt with chills, productive cough - green and clear at times.  Unknown of fevers. Started 3-4 days ago.

## 2018-01-21 NOTE — ED Notes (Signed)
Pt reports he feels he has the flu  Wet cough  No flu shot this year

## 2018-01-21 NOTE — ED Notes (Signed)
From Rad 

## 2018-01-21 NOTE — Discharge Instructions (Addendum)
Your x-ray is negative for pneumonia or acute changes.  Your examination shows an upper respiratory versus flulike infection.  Please increase fluids.  Please wash hands frequently.  Usual mask until symptoms have resolved.  Use Tylenol extra strength every 4 hours for aching or discomfort.  Use Decadron and Claritin-D 2 times daily with food.  Use Promethazine DM for cough. This medication may cause drowsiness. Please do not drink, drive, or participate in activity that requires concentration while taking this medication.  See Dr. Evette Doffing for office evaluation if not improving.

## 2018-01-22 ENCOUNTER — Encounter (INDEPENDENT_AMBULATORY_CARE_PROVIDER_SITE_OTHER): Payer: Medicare Other | Admitting: Ophthalmology

## 2018-01-23 ENCOUNTER — Telehealth: Payer: Self-pay | Admitting: *Deleted

## 2018-01-23 NOTE — Telephone Encounter (Signed)
Pt has Hospital follow up appt tomorrow with Dr Evette Doffing.

## 2018-01-24 ENCOUNTER — Telehealth: Payer: Self-pay | Admitting: Pediatrics

## 2018-01-24 ENCOUNTER — Ambulatory Visit: Payer: Medicare Other | Admitting: Pediatrics

## 2018-01-24 ENCOUNTER — Other Ambulatory Visit: Payer: Self-pay

## 2018-01-24 NOTE — Patient Outreach (Signed)
[facility]) Care Management  01/24/2018   FIN Brad Mendoza October 02, 1955 [id number]  Subjective: Successful telephone call to the patient for assessment. HIPAA verified.  The patient states that he is not feeling good today.  The patient states that he was seen in the  Er for URI on Sunday.  He was given medication but does not feel any better.  He states that he has some tightness in his chest, some shortness of breath and is unable to cough up the phlegm. He states that he is very tired because he was unable to get any rest due to the cough. He has an appointment with his physician today at 4 pm.  The patient denies any pain or falls.  He is taking his medication as prescribed.  He did not check his weight today because he has not picked up batteries for his scale. He states Sunday in the ER his weight was 227 lbs.  I reinforced with the patient the importance of checking his weight.  He verbalized understanding.  The patient had not checked his blood sugar today but states that when he goes back home he will check and call me with the results.    Objective:   Current Medications:  Current Outpatient Medications  Medication Sig Dispense Refill  . albuterol (PROVENTIL HFA;VENTOLIN HFA) 108 (90 Base) MCG/ACT inhaler Inhale 2 puffs into the lungs every 6 (six) hours as needed for wheezing or shortness of breath. 1 Inhaler 2  . aspirin EC 81 MG tablet Take 81 mg by mouth daily.    . Capsaicin (ICY HOT ARTHRITIS THERAPY EX) Apply 1 application topically 2 (two) times daily as needed (back pain).     . carvedilol (COREG) 12.5 MG tablet TAKE ONE TABLET TWICE A DAY WITH FOOD 180 tablet 1  . dexamethasone (DECADRON) 4 MG tablet Take 1 tablet (4 mg total) by mouth 2 (two) times daily with a meal. 8 tablet 0  . empagliflozin (JARDIANCE) 10 MG TABS tablet Take 10 mg by mouth daily. 30 tablet 6  . Ergocalciferol (VITAMIN D2) 400 units TABS Take 1 tablet by mouth daily.    . fluticasone  (FLONASE) 50 MCG/ACT nasal spray USE 2 SPRAYS IN EACH NOSTRIL DAILY 48 g 1  . hydrALAZINE (APRESOLINE) 50 MG tablet Take 1 tablet (50 mg total) by mouth 3 (three) times daily. 90 tablet 11  . isosorbide mononitrate (IMDUR) 60 MG 24 hr tablet Take 1.5 tablets (90 mg total) by mouth daily. 135 tablet 1  . loratadine-pseudoephedrine (CLARITIN-D 12 HOUR) 5-120 MG tablet Take 1 tablet by mouth 2 (two) times daily. 20 tablet 0  . metFORMIN (GLUCOPHAGE-XR) 500 MG 24 hr tablet Take 1 tablet (500 mg total) by mouth daily with breakfast. 90 tablet 1  . Multiple Vitamins-Minerals (CENTRUM SILVER ADULT 50+ PO) Take 1 tablet by mouth daily.    . nitroGLYCERIN (NITROSTAT) 0.4 MG SL tablet DISSOLVE 1 TAB UNDER TOUNGE FOR CHEST PAIN. MAY REPEAT EVERY 5 MINUTES FOR 3 DOSES. IF NO RELIEF CALL 911 OR GO TO ER 25 tablet 2  . Omega-3 Fatty Acids (FISH OIL) 1000 MG CPDR Take 1 capsule by mouth daily.    . pantoprazole (PROTONIX) 40 MG tablet TAKE ONE (1) TABLET EACH DAY 90 tablet 1  . promethazine-dextromethorphan (PROMETHAZINE-DM) 6.25-15 MG/5ML syrup Take 5 mLs by mouth 4 (four) times daily as needed. 118 mL 0  . ranolazine (RANEXA) 500 MG 12 hr tablet TAKE ONE TABLET BY MOUTH TWICE DAILY  60 tablet 5  . rosuvastatin (CRESTOR) 10 MG tablet TAKE ONE (1) TABLET EACH DAY 90 tablet 1  . sacubitril-valsartan (ENTRESTO) 97-103 MG Take 1 tablet by mouth 2 (two) times daily. 180 tablet 1  . spironolactone (ALDACTONE) 25 MG tablet Take 0.5 tablets (12.5 mg total) by mouth daily. 45 tablet 3  . terbinafine (LAMISIL) 1 % cream Apply 1 application topically 2 (two) times daily. Apply to both feet and between toes 30 g 1  . torsemide (DEMADEX) 20 MG tablet Take 40 mg by mouth daily.    [name] 300 UNIT/ML SOPN Inject 15-40 Units into the skin See admin instructions. Inject 40 units in the morning and 15 units at night    . ULTICARE MINI PEN NEEDLES 31G X 6 MM MISC 4 TIMES DAILY 100 each 2   Current Facility-Administered  Medications  Medication Dose Route Frequency Provider Last Rate Last Dose  . aflibercept (EYLEA) SOLN 2 mg  2 mg Intravitreal  [name], MD   2 mg at 10/20/17 0858  . aflibercept (EYLEA) SOLN 2 mg  2 mg Intravitreal  [name], MD   2 mg at 11/21/17 1214  . aflibercept (EYLEA) SOLN 2 mg  2 mg Intravitreal  [name], MD   2 mg at 12/21/17 0933  . Bevacizumab (AVASTIN) SOLN 1.25 mg  1.25 mg Intravitreal  [name], MD   1.25 mg at 07/19/17 0849  . Bevacizumab (AVASTIN) SOLN 1.25 mg  1.25 mg Intravitreal  [name], MD   1.25 mg at 08/16/17 1232  . Bevacizumab (AVASTIN) SOLN 1.25 mg  1.25 mg Intravitreal  [name], MD   1.25 mg at 09/18/17 1024    Functional Status:  No flowsheet data found.  Fall/Depression Screening: Fall Risk  01/24/2018 12/20/2017 11/20/2017  Falls in the past year? 0 No No  Comment - - -  Number falls in past yr: - - -  Injury with Fall? - - -  Comment - - -  Risk for fall due to : - - -  Follow up - - -   PHQ 2/9 Scores 12/14/2016 12/02/2016 09/26/2016 09/14/2016 09/01/2016 08/22/2016 07/07/2016  PHQ - 2 Score 1 0 0 0 0 0 0    Assessment:Patient will continue to benefit from health coach outreach for disease management and support. [facility] CM Care Plan Problem One     Most Recent Value  [facility] Long Term Goal   In 30 days the patient will verbalize that he has kept his appointmnet with his physician  [place] Term Goal Start Date  01/24/18  Interventions for Problem One Long Term Goal  Discussed with the patient about signs and sypmtoms of uri, medication adherence, weighing daily     Plan:  [place] will contact patient in the month of December and patient agrees to next outreach.   [name] RN, BSN, [place] Direct Dial:  [phone]  Fax: [phone]

## 2018-01-25 ENCOUNTER — Other Ambulatory Visit: Payer: Self-pay | Admitting: *Deleted

## 2018-01-25 ENCOUNTER — Ambulatory Visit (INDEPENDENT_AMBULATORY_CARE_PROVIDER_SITE_OTHER): Payer: Medicare Other | Admitting: Family

## 2018-01-25 ENCOUNTER — Encounter: Payer: Self-pay | Admitting: Family

## 2018-01-25 ENCOUNTER — Telehealth: Payer: Self-pay | Admitting: Pediatrics

## 2018-01-25 VITALS — BP 150/87 | HR 82 | Temp 97.0°F | Ht 69.0 in | Wt 224.6 lb

## 2018-01-25 DIAGNOSIS — K219 Gastro-esophageal reflux disease without esophagitis: Secondary | ICD-10-CM

## 2018-01-25 DIAGNOSIS — J189 Pneumonia, unspecified organism: Secondary | ICD-10-CM

## 2018-01-25 MED ORDER — PREDNISONE 10 MG (21) PO TBPK: ORAL_TABLET | ORAL | 0 refills | Status: DC

## 2018-01-25 MED ORDER — TOUJEO SOLOSTAR 300 UNIT/ML ~~LOC~~ SOPN: PEN_INJECTOR | SUBCUTANEOUS | 0 refills | Status: DC

## 2018-01-25 MED ORDER — ALBUTEROL SULFATE HFA 108 (90 BASE) MCG/ACT IN AERS: 2.0000 | INHALATION_SPRAY | Freq: Four times a day (QID) | RESPIRATORY_TRACT | 2 refills | Status: DC | PRN

## 2018-01-25 MED ORDER — PANTOPRAZOLE SODIUM 40 MG PO TBEC: DELAYED_RELEASE_TABLET | ORAL | 1 refills | Status: DC

## 2018-01-25 MED ORDER — DOXYCYCLINE HYCLATE 100 MG PO TABS: 100.0000 mg | ORAL_TABLET | Freq: Two times a day (BID) | ORAL | 0 refills | Status: DC

## 2018-01-25 NOTE — Patient Instructions (Signed)

## 2018-01-25 NOTE — Telephone Encounter (Signed)
Pt states that he is out of his insulin and needs some sent to [facility]. Pt is scheduled to see vincent on 12/17

## 2018-01-25 NOTE — Progress Notes (Signed)
Subjective:    Patient ID: Brad Mendoza, male    DOB: 06/05/1955, 62 y.o.   MRN: [id number]  Chief Complaint  Patient presents with  . Cough    congestion with sputum, wheezing laying down    Cough  This is a new problem. The current episode started 1 to 4 weeks ago. The problem has been gradually improving. The problem occurs every few minutes. The cough is productive of sputum. Associated symptoms include chills, ear pain, a fever, headaches, heartburn, myalgias, nasal congestion, a sore throat, shortness of breath and wheezing. Pertinent negatives include no ear congestion. The symptoms are aggravated by lying down. He has tried rest (sterioids) for the symptoms. The treatment provided mild relief. There is no history of COPD.  Gastroesophageal Reflux  He complains of choking, coughing, heartburn, a sore throat and wheezing. He reports no hoarse voice. This is a chronic problem. The current episode started more than 1 year ago. The problem occurs frequently.      Review of Systems  Constitutional: Positive for chills and fever.  HENT: Positive for ear pain and sore throat. Negative for hoarse voice.   Respiratory: Positive for cough, choking, shortness of breath and wheezing.   Gastrointestinal: Positive for heartburn.  Musculoskeletal: Positive for myalgias.  Neurological: Positive for headaches.  All other systems reviewed and are negative.      Objective:   Physical Exam  Constitutional: He is oriented to person, place, and time. He appears well-developed and well-nourished. No distress.  HENT:  Head: Normocephalic.  Right Ear: External ear normal.  Left Ear: External ear normal.  Nose: Mucosal edema present.  Mouth/Throat: Posterior oropharyngeal erythema present.  Eyes: Pupils are equal, round, and reactive to light. Right eye exhibits no discharge. Left eye exhibits no discharge.  Neck: Normal range of motion. Neck supple. No thyromegaly present.  Cardiovascular:  Normal rate, regular rhythm, normal heart sounds and intact distal pulses.  No murmur heard. Pulmonary/Chest: Effort normal. No respiratory distress. He has wheezes. He has rales in the left lower field.  Abdominal: Soft. Bowel sounds are normal. He exhibits no distension. There is no tenderness.  Musculoskeletal: Normal range of motion. He exhibits no edema or tenderness.  Neurological: He is alert and oriented to person, place, and time. He has normal reflexes. No cranial nerve deficit.  Skin: Skin is warm and dry. No rash noted. No erythema.  Psychiatric: He has a normal mood and affect. His behavior is normal. Judgment and thought content normal.  Vitals reviewed.     BP (!) 150/87   Pulse 82   Temp (!) 97 F (36.1 C) (Oral)   Ht 5\' 9"  (1.753 m)   Wt 224 lb 9.6 oz (101.9 kg)   BMI 33.17 kg/m      Assessment & Plan:  Brad Mendoza comes in today with chief complaint of Cough (congestion with sputum, wheezing laying down)   Diagnosis and orders addressed:  1. Community acquired pneumonia of left lung, unspecified part of lung - Take meds as prescribed - Use a cool mist humidifier  -Use saline nose sprays frequently -Force fluids -For any cough or congestion  Use plain Mucinex- regular strength or max strength is fine -For fever or aces or pains- take tylenol or ibuprofen. -Throat lozenges if help -Keep follow up with PCP on 02/06/18 and recheck or sooner if breathing worsens - doxycycline (VIBRA-TABS) 100 MG tablet; Take 1 tablet (100 mg total) by mouth 2 (two) times daily.  Dispense: 20 tablet; Refill: 0 - predniSONE (STERAPRED UNI-PAK 21 TAB) 10 MG (21) TBPK tablet; Use as directed  Dispense: 21 tablet; Refill: 0 - albuterol (PROVENTIL HFA;VENTOLIN HFA) 108 (90 Base) MCG/ACT inhaler; Inhale 2 puffs into the lungs every 6 (six) hours as needed for wheezing or shortness of breath.  Dispense: 1 Inhaler; Refill: 2  2. Gastroesophageal reflux disease, esophagitis presence  not specified -Diet discussed- Avoid fried, spicy, citrus foods, caffeine and alcohol -Do not eat 2-3 hours before bedtime -Encouraged small frequent meals - pantoprazole (PROTONIX) 40 MG tablet; TAKE ONE (1) TABLET EACH DAY  Dispense: 90 tablet; Refill: 1   .Evelina Dun, FNP

## 2018-01-25 NOTE — Telephone Encounter (Signed)
Pt aware of appt today with [facility] for acute visit and he still needs to be seen with Dr Evette Doffing on 12/17 for chronic follow up.

## 2018-01-31 ENCOUNTER — Other Ambulatory Visit: Payer: Self-pay

## 2018-01-31 NOTE — Patient Outreach (Signed)
[facility]) Care Management  01/31/2018  MEHAR KIRKWOOD [birth year] [id number]   Medication Adherence call to [name] spoke with patient he is due on Rosuvastatin 10 mg patient said he has medication at this time and when ever he needs it his pharmacy is very good and send in his prescription. Mr. Busta is showing past due under [place].   [place] Management Direct Dial [phone]  Fax [phone] [email or web address]

## 2018-02-02 ENCOUNTER — Encounter (INDEPENDENT_AMBULATORY_CARE_PROVIDER_SITE_OTHER): Payer: Medicare Other | Admitting: Ophthalmology

## 2018-02-05 ENCOUNTER — Other Ambulatory Visit: Payer: Self-pay | Admitting: Pediatrics

## 2018-02-06 ENCOUNTER — Other Ambulatory Visit: Payer: Self-pay | Admitting: Pediatrics

## 2018-02-06 ENCOUNTER — Encounter: Payer: Self-pay | Admitting: Pediatrics

## 2018-02-06 ENCOUNTER — Ambulatory Visit (INDEPENDENT_AMBULATORY_CARE_PROVIDER_SITE_OTHER): Payer: Medicare Other | Admitting: Pediatrics

## 2018-02-06 VITALS — BP 128/73 | HR 83 | Temp 98.8°F | Ht 69.0 in | Wt 230.4 lb

## 2018-02-06 DIAGNOSIS — E7800 Pure hypercholesterolemia, unspecified: Secondary | ICD-10-CM

## 2018-02-06 DIAGNOSIS — E118 Type 2 diabetes mellitus with unspecified complications: Secondary | ICD-10-CM

## 2018-02-06 DIAGNOSIS — I2510 Atherosclerotic heart disease of native coronary artery without angina pectoris: Secondary | ICD-10-CM

## 2018-02-06 DIAGNOSIS — B353 Tinea pedis: Secondary | ICD-10-CM

## 2018-02-06 DIAGNOSIS — I428 Other cardiomyopathies: Secondary | ICD-10-CM

## 2018-02-06 DIAGNOSIS — I10 Essential (primary) hypertension: Secondary | ICD-10-CM

## 2018-02-06 DIAGNOSIS — Z794 Long term (current) use of insulin: Secondary | ICD-10-CM

## 2018-02-06 LAB — BAYER DCA HB A1C WAIVED: HB A1C (BAYER DCA - WAIVED): >14 % — ABNORMAL HIGH (ref <7.0)

## 2018-02-06 MED ORDER — ISOSORBIDE MONONITRATE ER 60 MG PO TB24: 90.0000 mg | ORAL_TABLET | Freq: Every day | ORAL | 1 refills | Status: DC

## 2018-02-06 MED ORDER — TOUJEO SOLOSTAR 300 UNIT/ML ~~LOC~~ SOPN: PEN_INJECTOR | SUBCUTANEOUS | 0 refills | Status: DC

## 2018-02-06 MED ORDER — ROSUVASTATIN CALCIUM 10 MG PO TABS: ORAL_TABLET | ORAL | 1 refills | Status: DC

## 2018-02-06 MED ORDER — METFORMIN HCL ER 500 MG PO TB24: 500.0000 mg | ORAL_TABLET | Freq: Every day | ORAL | 1 refills | Status: DC

## 2018-02-06 MED ORDER — TERBINAFINE HCL 1 % EX CREA: 1.0000 "application " | TOPICAL_CREAM | Freq: Two times a day (BID) | CUTANEOUS | 1 refills | Status: DC

## 2018-02-06 NOTE — Patient Instructions (Signed)
Bring blood sugar log to next visit. Check before meals

## 2018-02-06 NOTE — Progress Notes (Signed)
Subjective:   Patient ID: Brad Mendoza, male    DOB: [birth year], 62 y.o.   MRN: [id number] CC: Medical Management of Chronic Issues  HPI: Brad Mendoza is a 62 y.o. male   Systolic CHF: History of NICM, had R/L heart cath in 05/2016 with CAD. EF 25% in 05/2017 per cardiology note, follows with cardioligy.   CKD stage III: Following with nephrology.  Has proteinuria secondary to diabetic nephropathy.  Taking torsemide 40 mg daily, Spironolactone 12.5 mg daily.  He has noticed more swelling in his ankles over the last few days.  He took 60 mg of torsemide yesterday and this morning.  OSA: Recently recommended to start CPAP.  Following with sleep medicine.  Diabetes: Does have bilateral diabetic retinopathy with macular edema.  Following with ophthalmology.  Has not been eating as well as he should, eating more sugary foods and carbohydrates than he should.  Not drinking any sugary beverages.  A1c was last checked 1 year ago, at that time was 6.7.  Has been rarely that well controlled.  He says he thinks the difference between then and now is his eating habits.  Increased rest recently at home with relationship with wife.  He is hopeful stress will improve over the next few weeks.    Relevant past medical, surgical, family and social history reviewed. Allergies and medications reviewed and updated. Social History   Tobacco Use  Smoking Status Never Smoker  Smokeless Tobacco Current User  . Types: Snuff  Tobacco Comment   dips snuff about 6 cans/week for 3 years   ROS: Per HPI   Objective:    BP 128/73   Pulse 83   Temp 98.8 F (37.1 C) (Oral)   Ht 5' 9" (1.753 m)   Wt 230 lb 6.4 oz (104.5 kg)   BMI 34.02 kg/m   Wt Readings from Last 3 Encounters:  02/06/18 230 lb 6.4 oz (104.5 kg)  01/25/18 224 lb 9.6 oz (101.9 kg)  01/21/18 229 lb (103.9 kg)    Gen: NAD, alert, cooperative with exam, NCAT EYES: EOMI, no conjunctival injection, or no icterus ENT: OP without  erythema LYMPH: no cervical LAD CV: NRRR, normal S1/S2, no murmur, distal pulses 2+ b/l Resp: CTABL, no wheezes, normal WOB Abd: +BS, soft, NTND. no guarding or organomegaly Ext: 1+ pitting edema, warm Neuro: Alert and oriented, strength equal b/l UE and LE, coordination grossly normal  Assessment & Plan:  Brad Mendoza was seen today for medical management of chronic issues.  Diagnoses and all orders for this visit:  Type 2 diabetes mellitus with complication, with long-term current use of insulin ([place]) Uncontrolled.  A1c greater than 14.  Not clear how regularly he has been with taking his medicines given poor control.  Will refer to nutrition, refer to chronic care management for review of medications.  Check blood sugars at home, follow-up in 4 weeks. -     BMP8+EGFR -     Bayer DCA Hb A1c Waived -     TOUJEO SOLOSTAR 300 UNIT/ML SOPN; Inject 40 Units into the skin every morning AND 15 Units every evening. -     metFORMIN (GLUCOPHAGE-XR) 500 MG 24 hr tablet; Take 1 tablet (500 mg total) by mouth daily with breakfast. -     Amb ref to Medical Nutrition Therapy-MNT  Pure hypercholesterolemia Stable, continue below -     rosuvastatin (CRESTOR) 10 MG tablet; TAKE ONE (1) TABLET EACH DAY  Essential hypertension Stable, continue below -  isosorbide mononitrate (IMDUR) 60 MG 24 hr tablet; Take 1.5 tablets (90 mg total) by mouth daily.  Tinea pedis of both feet Restart below. -     terbinafine (LAMISIL) 1 % cream; Apply 1 application topically 2 (two) times daily. Apply to both feet and between toes   Follow up plan: Return in about 4 weeks (around 03/06/2018). Brad [name], MD [facility]  

## 2018-02-07 LAB — BMP8+EGFR
BUN/Creatinine Ratio: 22 (ref 10–24)
BUN: 39 mg/dL — ABNORMAL HIGH (ref 8–27)
CO2: 24 mmol/L (ref 20–29)
CREATININE: 1.78 mg/dL — AB (ref 0.76–1.27)
Calcium: 8.4 mg/dL — ABNORMAL LOW (ref 8.6–10.2)
Chloride: 105 mmol/L (ref 96–106)
GFR calc Af Amer: 47 mL/min/{1.73_m2} — ABNORMAL LOW (ref >59)
GFR calc non Af Amer: 40 mL/min/{1.73_m2} — ABNORMAL LOW (ref >59)
Glucose: 262 mg/dL — ABNORMAL HIGH (ref 65–99)
Potassium: 4.5 mmol/L (ref 3.5–5.2)
Sodium: 141 mmol/L (ref 134–144)

## 2018-02-12 ENCOUNTER — Other Ambulatory Visit: Payer: Self-pay | Admitting: Pediatrics

## 2018-02-12 ENCOUNTER — Other Ambulatory Visit (HOSPITAL_COMMUNITY): Payer: Self-pay | Admitting: Student

## 2018-02-12 DIAGNOSIS — Z794 Long term (current) use of insulin: Principal | ICD-10-CM

## 2018-02-12 DIAGNOSIS — E118 Type 2 diabetes mellitus with unspecified complications: Secondary | ICD-10-CM

## 2018-02-12 DIAGNOSIS — I10 Essential (primary) hypertension: Secondary | ICD-10-CM

## 2018-03-02 ENCOUNTER — Other Ambulatory Visit: Payer: Self-pay

## 2018-03-02 NOTE — Patient Outreach (Signed)
[facility]) Care Management  03/02/2018   Brad Mendoza 12/22/1955 [id number]  Subjective: Successful outreach to the patient for assessment. HIPAA verified.  The patient states that he has just gotten over cold symptoms that he has had for three weeks.  He states that he has been feeling upper back pain that he gets every now and then.  He rates it at a 7/10.  He denies any falls, chest pain, shortness of breath and swelling. He checked his blood sugar this morning and it was 200.  He states that he forgot to take his medication last night.  He states that his a1c was recently checked and his a1c went from 9.7 to 14.0.  The patient states that he has been eating a lot of breads ans sweets.  Discussed with the patient about foods to limit and setting an alarm to help to remember to take his shots.  I will send the patient some EMMI information on diet.  He verbalized understanding.  The patient states that he weighs 230 lbs.  He still does not have a working scaled but continues to tell me that he will get one.  I review with him the Importance of having a scale.  He verbalized understanding.  The patient has an appointment with his cardiologist on 03/12/18 and with Jearld Fenton Certified Diabetes Educator on 03/13/18.     Current Medications:  Current Outpatient Medications  Medication Sig Dispense Refill  . albuterol (PROVENTIL HFA;VENTOLIN HFA) 108 (90 Base) MCG/ACT inhaler Inhale 2 puffs into the lungs every 6 (six) hours as needed for wheezing or shortness of breath. 1 Inhaler 2  . aspirin EC 81 MG tablet Take 81 mg by mouth daily.    . Capsaicin (ICY HOT ARTHRITIS THERAPY EX) Apply 1 application topically 2 (two) times daily as needed (back pain).     . carvedilol (COREG) 12.5 MG tablet TAKE ONE TABLET TWICE A DAY WITH FOOD 180 tablet 1  . empagliflozin (JARDIANCE) 10 MG TABS tablet Take 10 mg by mouth daily. 30 tablet 6  . Ergocalciferol (VITAMIN D2) 400 units TABS Take 1  tablet by mouth daily.    . fluticasone (FLONASE) 50 MCG/ACT nasal spray USE 2 SPRAYS IN EACH NOSTRIL DAILY 48 g 1  . hydrALAZINE (APRESOLINE) 50 MG tablet Take 1 tablet (50 mg total) by mouth 3 (three) times daily. 90 tablet 11  . isosorbide mononitrate (IMDUR) 60 MG 24 hr tablet Take 1.5 tablets (90 mg total) by mouth daily. 135 tablet 1  . loratadine-pseudoephedrine (CLARITIN-D 12 HOUR) 5-120 MG tablet Take 1 tablet by mouth 2 (two) times daily. 20 tablet 0  . metFORMIN (GLUCOPHAGE-XR) 500 MG 24 hr tablet Take 1 tablet (500 mg total) by mouth daily with breakfast. 90 tablet 1  . Multiple Vitamins-Minerals (CENTRUM SILVER ADULT 50+ PO) Take 1 tablet by mouth daily.    . nitroGLYCERIN (NITROSTAT) 0.4 MG SL tablet DISSOLVE 1 TAB UNDER TOUNGE FOR CHEST PAIN. MAY REPEAT EVERY 5 MINUTES FOR 3 DOSES. IF NO RELIEF CALL 911 OR GO TO ER 25 tablet 2  . Omega-3 Fatty Acids (FISH OIL) 1000 MG CPDR Take 1 capsule by mouth daily.    [name] VERIO test strip     . pantoprazole (PROTONIX) 40 MG tablet TAKE ONE (1) TABLET EACH DAY 90 tablet 1  . ranolazine (RANEXA) 500 MG 12 hr tablet TAKE ONE TABLET BY MOUTH TWICE DAILY 60 tablet 5  . rosuvastatin (CRESTOR) 10 MG  tablet TAKE ONE (1) TABLET EACH DAY 90 tablet 1  . sacubitril-valsartan (ENTRESTO) 97-103 MG Take 1 tablet by mouth 2 (two) times daily. 180 tablet 1  . spironolactone (ALDACTONE) 25 MG tablet Take 0.5 tablets (12.5 mg total) by mouth daily. 45 tablet 3  . terbinafine (LAMISIL) 1 % cream Apply 1 application topically 2 (two) times daily. Apply to both feet and between toes 30 g 1  . torsemide (DEMADEX) 20 MG tablet Take 40 mg by mouth daily.    [name] 300 UNIT/ML SOPN Inject 40 Units into the skin every morning AND 15 Units every evening. 4.5 mL 0  . ULTICARE MINI PEN NEEDLES 31G X 6 MM MISC 4 TIMES DAILY 100 each 2  . hydrALAZINE (APRESOLINE) 25 MG tablet TAKE ONE (1) TABLET THREE (3) TIMES EACH DAY 270 tablet 3   Current  Facility-Administered Medications  Medication Dose Route Frequency Provider Last Rate Last Dose  . aflibercept (EYLEA) SOLN 2 mg  2 mg Intravitreal  [name], MD   2 mg at 10/20/17 0858  . aflibercept (EYLEA) SOLN 2 mg  2 mg Intravitreal  [name], MD   2 mg at 11/21/17 1214  . aflibercept (EYLEA) SOLN 2 mg  2 mg Intravitreal  [name], MD   2 mg at 12/21/17 0933  . Bevacizumab (AVASTIN) SOLN 1.25 mg  1.25 mg Intravitreal  [name], MD   1.25 mg at 07/19/17 0849  . Bevacizumab (AVASTIN) SOLN 1.25 mg  1.25 mg Intravitreal  [name], MD   1.25 mg at 08/16/17 1232  . Bevacizumab (AVASTIN) SOLN 1.25 mg  1.25 mg Intravitreal  [name], MD   1.25 mg at 09/18/17 1024    Functional Status:  No flowsheet data found.  Fall/Depression Screening: Fall Risk  03/02/2018 01/25/2018 01/24/2018  Falls in the past year? 0 0 0  Comment - - -  Number falls in past yr: - - -  Injury with Fall? - - -  Comment - - -  Risk for fall due to : - - -  Follow up - - -   PHQ 2/9 Scores 01/25/2018 12/14/2016 12/02/2016 09/26/2016 09/14/2016 09/01/2016 08/22/2016  PHQ - 2 Score 0 1 0 0 0 0 0    Assessment: Patient will continue to benefit from health coach outreach for disease management and support.  THN CM Care Plan Problem One     Most Recent Value  THN Long Term Goal   In 30 days the patient will verbalize that he has watched his diet to lower his a1c and staying out of the hospital for chf  Interventions for Problem One Long Term Goal  Discussed with the patient about his diet and things that can increase his blood sugars.  Medication adherence and exercise.     Plan: RN Health Coach will contact patient in the month of February and patient agrees to next outreach.   [name] RN, BSN, [place] Direct Dial:  [phone]  Fax: [phone]

## 2018-03-12 ENCOUNTER — Other Ambulatory Visit (HOSPITAL_COMMUNITY): Payer: Self-pay

## 2018-03-12 ENCOUNTER — Ambulatory Visit (HOSPITAL_BASED_OUTPATIENT_CLINIC_OR_DEPARTMENT_OTHER)
Admission: RE | Admit: 2018-03-12 | Discharge: 2018-03-12 | Disposition: A | Discharge status: Home / Self Care | Payer: Medicare Other | Source: Ambulatory Visit | Attending: Internal Medicine | Admitting: Internal Medicine

## 2018-03-12 ENCOUNTER — Encounter (HOSPITAL_COMMUNITY): Payer: Self-pay | Admitting: Internal Medicine

## 2018-03-12 ENCOUNTER — Ambulatory Visit (HOSPITAL_COMMUNITY)
Admission: RE | Admit: 2018-03-12 | Discharge: 2018-03-12 | Disposition: A | Discharge status: Home / Self Care | Payer: Medicare Other | Source: Ambulatory Visit | Attending: Pediatrics | Admitting: Pediatrics

## 2018-03-12 VITALS — BP 160/80 | HR 72 | Wt 235.4 lb

## 2018-03-12 DIAGNOSIS — Z8249 Family history of ischemic heart disease and other diseases of the circulatory system: Secondary | ICD-10-CM | POA: Diagnosis not present

## 2018-03-12 DIAGNOSIS — N183 Chronic kidney disease, stage 3 unspecified: Secondary | ICD-10-CM

## 2018-03-12 DIAGNOSIS — K219 Gastro-esophageal reflux disease without esophagitis: Secondary | ICD-10-CM | POA: Diagnosis not present

## 2018-03-12 DIAGNOSIS — Z79899 Other long term (current) drug therapy: Secondary | ICD-10-CM | POA: Diagnosis not present

## 2018-03-12 DIAGNOSIS — R0683 Snoring: Secondary | ICD-10-CM | POA: Diagnosis not present

## 2018-03-12 DIAGNOSIS — R0602 Shortness of breath: Secondary | ICD-10-CM | POA: Insufficient documentation

## 2018-03-12 DIAGNOSIS — E1122 Type 2 diabetes mellitus with diabetic chronic kidney disease: Secondary | ICD-10-CM | POA: Insufficient documentation

## 2018-03-12 DIAGNOSIS — Z794 Long term (current) use of insulin: Secondary | ICD-10-CM

## 2018-03-12 DIAGNOSIS — I428 Other cardiomyopathies: Secondary | ICD-10-CM | POA: Insufficient documentation

## 2018-03-12 DIAGNOSIS — F1010 Alcohol abuse, uncomplicated: Secondary | ICD-10-CM | POA: Insufficient documentation

## 2018-03-12 DIAGNOSIS — I10 Essential (primary) hypertension: Secondary | ICD-10-CM

## 2018-03-12 DIAGNOSIS — E785 Hyperlipidemia, unspecified: Secondary | ICD-10-CM | POA: Diagnosis not present

## 2018-03-12 DIAGNOSIS — I5042 Chronic combined systolic (congestive) and diastolic (congestive) heart failure: Secondary | ICD-10-CM | POA: Diagnosis present

## 2018-03-12 DIAGNOSIS — E1142 Type 2 diabetes mellitus with diabetic polyneuropathy: Secondary | ICD-10-CM

## 2018-03-12 DIAGNOSIS — I272 Pulmonary hypertension, unspecified: Secondary | ICD-10-CM | POA: Diagnosis not present

## 2018-03-12 DIAGNOSIS — Z7982 Long term (current) use of aspirin: Secondary | ICD-10-CM | POA: Insufficient documentation

## 2018-03-12 DIAGNOSIS — I2510 Atherosclerotic heart disease of native coronary artery without angina pectoris: Secondary | ICD-10-CM

## 2018-03-12 DIAGNOSIS — F17220 Nicotine dependence, chewing tobacco, uncomplicated: Secondary | ICD-10-CM | POA: Diagnosis not present

## 2018-03-12 DIAGNOSIS — Z7984 Long term (current) use of oral hypoglycemic drugs: Secondary | ICD-10-CM | POA: Diagnosis not present

## 2018-03-12 DIAGNOSIS — I130 Hypertensive heart and chronic kidney disease with heart failure and stage 1 through stage 4 chronic kidney disease, or unspecified chronic kidney disease: Secondary | ICD-10-CM | POA: Diagnosis not present

## 2018-03-12 DIAGNOSIS — I5022 Chronic systolic (congestive) heart failure: Secondary | ICD-10-CM

## 2018-03-12 LAB — CBC
HCT: 33.5 % — ABNORMAL LOW (ref 39.0–52.0)
Hemoglobin: 10.2 g/dL — ABNORMAL LOW (ref 13.0–17.0)
MCH: 26.2 pg (ref 26.0–34.0)
MCHC: 30.4 g/dL (ref 30.0–36.0)
MCV: 86.1 fL (ref 80.0–100.0)
PLATELETS: 228 10*3/uL (ref 150–400)
RBC: 3.89 MIL/uL — ABNORMAL LOW (ref 4.22–5.81)
RDW: 13.7 % (ref 11.5–15.5)
WBC: 7.8 10*3/uL (ref 4.0–10.5)
nRBC: 0 % (ref 0.0–0.2)

## 2018-03-12 LAB — BASIC METABOLIC PANEL
Anion gap: 8 (ref 5–15)
BUN: 31 mg/dL — AB (ref 8–23)
CO2: 25 mmol/L (ref 22–32)
Calcium: 8.2 mg/dL — ABNORMAL LOW (ref 8.9–10.3)
Chloride: 108 mmol/L (ref 98–111)
Creatinine, Ser: 1.76 mg/dL — ABNORMAL HIGH (ref 0.61–1.24)
GFR calc Af Amer: 47 mL/min — ABNORMAL LOW (ref >60)
GFR calc non Af Amer: 41 mL/min — ABNORMAL LOW (ref >60)
Glucose, Bld: 107 mg/dL — ABNORMAL HIGH (ref 70–99)
Potassium: 4 mmol/L (ref 3.5–5.1)
Sodium: 141 mmol/L (ref 135–145)

## 2018-03-12 LAB — PROTIME-INR
INR: 0.98
Prothrombin Time: 12.9 seconds (ref 11.4–15.2)

## 2018-03-12 NOTE — Addendum Note (Signed)
Encounter addended by: Jovita Kussmaul, RN on: 03/12/2018 12:13 PM  Actions taken: Charge Capture section accepted, Clinical Note Signed

## 2018-03-12 NOTE — H&P (View-Only) (Signed)
Advanced Heart Failure Clinic Note   Primary Care: Dr. Alain Honey PCP-Cardiologist:   HPI: Brad Mendoza is a 63 y.o. male with a past medical history of systolic HF due to NICM, HTN, HLD, DM and CAD.   [place] June 2005 with 40-50% mid LAD stenosis, EF 45% at that time.    Admitted on 06/01/16 with chest pain relieved with nitroglycerin and volume overload. Troponin negative. Admission weight was 230 pounds. Right and left heart cath on 06/03/16 with 70-75% stenosis in the proximal and mid LAD,90% ostial stenosis in the 1st diagonal, and  60-70% stenosis in the mid RCA. RHC showed Fick C.O. 5.86/index 2.74. PA mean 33 mm Hg, LVEDP 25 mm Hg. EF by cath appears to be <25%. Medical therapy was recommended in the absence of ACS, and PCI was felt to be high risk with severe cardiomyopathy. CVTS was consulted and felt that CABG would not benefit him as he had NICM prior to his CAD. Discharge weight 207 pounds.   He presents today for routine follow-up. Says he is doing Brad Mendoza. Gets SOB with walking and has to use inhaler. Recently had URI but now improved. Gained about 8 pounds. Says he is eating too many noodles. Occasional tightness in chest but no too often. Mild LE edema. No orthopnea or PND. Missed his sleep study. Rescheduled for next week. Not drinking ETOH. Usually takes torsemide 40 daily but yesterday took 60 because he was swollen.  Echo today EF 30-35% Personally reviewed   Echo 4/19 EF 25% (read as 40-45%)    Review of systems complete and found to be negative unless listed in HPI.    [facility] 05/2016 Showed 70-75% stenosis in the proximal and mid LAD,90% ostial stenosis in the 1st diagonal, and  60-70% stenosis in the mid RCA. Mendoza 05/2016-  Fick C.O. 5.86/index 2.74  Past Medical History:  Diagnosis Date  . Acid reflux   . Back pain    f4 and f5  . CAD (coronary artery disease)   . CHF (congestive heart failure) ([place])    last echo was in March 2016  . Diabetes mellitus 2005  .  Diverticulosis   . H/O chest pain 2005   ECHO  . HTN (hypertension)   . Hyperlipidemia   . Nonischemic cardiomyopathy ([place])   . Onychomycosis   . Polysubstance abuse ([place])     Current Outpatient Medications  Medication Sig Dispense Refill  . albuterol (PROVENTIL HFA;VENTOLIN HFA) 108 (90 Base) MCG/ACT inhaler Inhale 2 puffs into the lungs every 6 (six) hours as needed for wheezing or shortness of breath. 1 Inhaler 2  . aspirin EC 81 MG tablet Take 81 mg by mouth daily.    . Capsaicin (ICY HOT ARTHRITIS THERAPY EX) Apply 1 application topically 2 (two) times daily as needed (back pain).     . carvedilol (COREG) 12.5 MG tablet TAKE ONE TABLET TWICE A DAY WITH FOOD 180 tablet 1  . empagliflozin (JARDIANCE) 10 MG TABS tablet Take 10 mg by mouth daily. 30 tablet 6  . Ergocalciferol (VITAMIN D2) 400 units TABS Take 1 tablet by mouth daily.    . fluticasone (FLONASE) 50 MCG/ACT nasal spray USE 2 SPRAYS IN EACH NOSTRIL DAILY 48 g 1  . hydrALAZINE (APRESOLINE) 50 MG tablet Take 1 tablet (50 mg total) by mouth 3 (three) times daily. 90 tablet 11  . isosorbide mononitrate (IMDUR) 60 MG 24 hr tablet Take 1.5 tablets (90 mg total) by mouth daily. 135 tablet 1  .  loratadine-pseudoephedrine (CLARITIN-D 12 HOUR) 5-120 MG tablet Take 1 tablet by mouth 2 (two) times daily. 20 tablet 0  . Multiple Vitamins-Minerals (CENTRUM SILVER ADULT 50+ PO) Take 1 tablet by mouth daily.    . nitroGLYCERIN (NITROSTAT) 0.4 MG SL tablet DISSOLVE 1 TAB UNDER TOUNGE FOR CHEST PAIN. MAY REPEAT EVERY 5 MINUTES FOR 3 DOSES. IF NO RELIEF CALL 911 OR GO TO ER 25 tablet 2  . Omega-3 Fatty Acids (FISH OIL) 1000 MG CPDR Take 1 capsule by mouth daily.    Brad Mendoza VERIO test strip     . pantoprazole (PROTONIX) 40 MG tablet TAKE ONE (1) TABLET EACH DAY 90 tablet 1  . ranolazine (RANEXA) 500 MG 12 hr tablet TAKE ONE TABLET BY MOUTH TWICE DAILY 60 tablet 5  . rosuvastatin (CRESTOR) 10 MG tablet TAKE ONE (1) TABLET EACH DAY 90 tablet 1    . sacubitril-valsartan (ENTRESTO) 97-103 MG Take 1 tablet by mouth 2 (two) times daily. 180 tablet 1  . spironolactone (ALDACTONE) 25 MG tablet Take 0.5 tablets (12.5 mg total) by mouth daily. 45 tablet 3  . terbinafine (LAMISIL) 1 % cream Apply 1 application topically 2 (two) times daily. Apply to both feet and between toes 30 g 1  . torsemide (DEMADEX) 20 MG tablet Take 20 mg by mouth daily.     Brad Mendoza SOLOSTAR 300 UNIT/ML SOPN Inject 40 Units into the skin every morning AND 15 Units every evening. 4.5 mL 0  . ULTICARE MINI PEN NEEDLES 31G X 6 MM MISC 4 TIMES DAILY 100 each 2  . metFORMIN (GLUCOPHAGE-XR) 500 MG 24 hr tablet Take 1 tablet (500 mg total) by mouth daily with breakfast. (Patient not taking: Reported on 03/12/2018) 90 tablet 1   Current Facility-Administered Medications  Medication Dose Route Frequency Provider Last Rate Last Dose  . aflibercept (EYLEA) SOLN 2 mg  2 mg Intravitreal  Brad Caffey, MD   2 mg at 10/20/17 0858  . aflibercept (EYLEA) SOLN 2 mg  2 mg Intravitreal  Brad Caffey, MD   2 mg at 11/21/17 1214  . aflibercept (EYLEA) SOLN 2 mg  2 mg Intravitreal  Brad Caffey, MD   2 mg at 12/21/17 0933  . Bevacizumab (AVASTIN) SOLN 1.25 mg  1.25 mg Intravitreal  Brad Caffey, MD   1.25 mg at 07/19/17 0849  . Bevacizumab (AVASTIN) SOLN 1.25 mg  1.25 mg Intravitreal  Brad Caffey, MD   1.25 mg at 08/16/17 1232  . Bevacizumab (AVASTIN) SOLN 1.25 mg  1.25 mg Intravitreal  Brad Caffey, MD   1.25 mg at 09/18/17 1024    Allergies  Allergen Reactions  . Sulfa Antibiotics Shortness Of Breath  . Penicillins Rash    Has patient had a PCN reaction causing immediate rash, facial/tongue/throat swelling, SOB or lightheadedness with hypotension: No Has patient had a PCN reaction causing severe rash involving mucus membranes or skin necrosis: No Has patient had a PCN reaction that required hospitalization No Has patient had a PCN reaction occurring within the last 10 years:  No If all of the above answers are "NO", then may proceed with Cephalosporin use.    Social History   Socioeconomic History  . Marital status: Legally Separated    Spouse name: Not on file  . Number of children: Not on file  . Years of education: Not on file  . Highest education level: Not on file  Occupational History  . Occupation: disabled  Social Needs  . Financial resource strain:  Not on file  . Food insecurity:    Worry: Not on file    Inability: Not on file  . Transportation needs:    Medical: Not on file    Non-medical: Not on file  Tobacco Use  . Smoking status: Never Smoker  . Smokeless tobacco: Current User    Types: Snuff  . Tobacco comment: dips snuff about 6 cans/week for 3 years  Substance and Sexual Activity  . Alcohol use: Yes    Alcohol/week: 1.0 standard drinks    Types: 1 Cans of beer per week    Comment: occ - has cut down, now once every 2-3 months  . Drug use: Yes    Types: Marijuana    Comment: occasional use - smoking less  . Sexual activity: Yes  Lifestyle  . Physical activity:    Days per week: Not on file    Minutes per session: Not on file  . Stress: Not on file  Relationships  . Social connections:    Talks on phone: Not on file    Gets together: Not on file    Attends religious service: Not on file    Active member of club or organization: Not on file    Attends meetings of clubs or organizations: Not on file    Relationship status: Not on file  . Intimate partner violence:    Fear of current or ex partner: Not on file    Emotionally abused: Not on file    Physically abused: Not on file    Forced sexual activity: Not on file  Other Topics Concern  . Not on file  Social History Narrative  . Not on file    Family History  Problem Relation Age of Onset  . Hypertension Father   . Diabetes Father   . Cancer Mother   . Alcohol abuse Brother   . Colon cancer Neg Hx    Vitals:   03/12/18 1106 03/12/18 1109  BP: (!) 160/78 (!)  160/80  Pulse: 72   SpO2: 98%   Weight: 106.8 kg (235 lb 6.4 oz)    Wt Readings from Last 3 Encounters:  03/12/18 106.8 kg (235 lb 6.4 oz)  02/06/18 104.5 kg (230 lb 6.4 oz)  01/25/18 101.9 kg (224 lb 9.6 oz)    PHYSICAL EXAM: General:  Obese Well appearing. No resp difficulty HEENT: normal Neck: supple. JVP 8-9 Carotids 2+ bilat; no bruits. No lymphadenopathy or thryomegaly appreciated. Cor: PMI nondisplaced. Regular rate & rhythm. No rubs, gallops or murmurs. Lungs: clear Abdomen: obese soft, nontender, nondistended. No hepatosplenomegaly. No bruits or masses. Good bowel sounds. Extremities: no cyanosis, clubbing, rash, 1-2+ edema R>L Neuro: alert & orientedx3, cranial nerves grossly intact. moves all 4 extremities w/o difficulty. Affect pleasant   ASSESSMENT & PLAN: 1. 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 He had preexisting NICM diagnosed in 2005 when his EF was 35% and had normal cors. Echo in 05/2016 with EF 30-35% and cath showed  70-75% stenosis in the proximal and mid LAD,90% ostial stenosis in the 1st diagonal, and  60-70% stenosis in the mid RCA. [place] 05/2016-  Fick C.O. 5.86/index 2.74 - Echo from 4/19 reviewed personally EF read as 40-45% but it is more like 25% - Echo today EF 30-35% - Stable NYHA II- early III - Volume status elevated.  - Continue torsemide 40 daily. Will take 60mg  x 2 days - Continue Entresto 97/103 mg BID.  - Continue Coreg 12.5 mg BID may  be able to increase at next visit.  - Continue isosorbide 90 mg daily.  - Continue hydralazine 50 mg TID.  - Continue spiro 25mg  daily - I have reviewed cath films again and LAD lesion is borderline significant. With regional anterior wall motion on echo I think it is worth another look - Reinforced fluid restriction to < 2 L daily, sodium restriction to less than 2000 mg daily, and the importance of daily weights.    - Will refer for ICD if no stent on cath - Continue Jardiance.   2. CAD - [facility] 05/2016  showed 70-75% stenosis in the proximal and mid LAD,90% ostial stenosis in the 1st diagonal, and  60-70% stenosis in the mid RCA. [place] 05/2016-  Fick C.O. 5.86/index 2.74 Medical management recommended.  - CVTS consulted felt that CABG would not improve EF as he has pre existing CM.  - I have reviewed cath films again and LAD lesion is borderline significant. EF improving overall but still with regional anterior wall motion on echo and I think it is worth another look. We discussed it and will plan on doing R/L cath in 1-2 weeks if renal function stable.  - Continue isosorbide and Ranexa. - Continue ASA and statin.   3. ETOH abuse - Congratulated him on cessation   4. Tobacco abuse - Chews tobacco.  - Encouraged complete cessation.   5. HTN - Blood pressure high today. Has not taken meds yet this. Says SBP typically 120-130s. Will follow.   6. CKD stage III - Baseline creatinine 1.7-2.2.  - Follows with Dr. Hinda Lenis - Will recheck today  7. DM - Per PCP. - Will add Jardiance 10   8. Fatigue - pending sleep study next week   Total time spent 35 minutes. Over half that time spent discussing above.   Glori Bickers, MD 03/12/18

## 2018-03-12 NOTE — Addendum Note (Signed)
Encounter addended by: Jovita Kussmaul, RN on: 03/12/2018 12:17 PM  Actions taken: Clinical Note Signed

## 2018-03-12 NOTE — Addendum Note (Signed)
Encounter addended by: Valeda Malm, RN on: 03/12/2018 12:16 PM  Actions taken: Diagnosis association updated, Order list changed

## 2018-03-12 NOTE — Progress Notes (Signed)
Advanced Heart Failure Clinic Note   Primary Care: Dr. Alain Honey PCP-Cardiologist:   HPI: Brad Mendoza is a 63 y.o. male with a past medical history of systolic HF due to NICM, HTN, HLD, DM and CAD.   [place] June 2005 with 40-50% mid LAD stenosis, EF 45% at that time.    Admitted on 06/01/16 with chest pain relieved with nitroglycerin and volume overload. Troponin negative. Admission weight was 230 pounds. Right and left heart cath on 06/03/16 with 70-75% stenosis in the proximal and mid LAD,90% ostial stenosis in the 1st diagonal, and  60-70% stenosis in the mid RCA. RHC showed Fick C.O. 5.86/index 2.74. PA mean 33 mm Hg, LVEDP 25 mm Hg. EF by cath appears to be <25%. Medical therapy was recommended in the absence of ACS, and PCI was felt to be high risk with severe cardiomyopathy. CVTS was consulted and felt that CABG would not benefit him as he had NICM prior to his CAD. Discharge weight 207 pounds.   He presents today for routine follow-up. Says he is doing [place]. Gets SOB with walking and has to use inhaler. Recently had URI but now improved. Gained about 8 pounds. Says he is eating too many noodles. Occasional tightness in chest but no too often. Mild LE edema. No orthopnea or PND. Missed his sleep study. Rescheduled for next week. Not drinking ETOH. Usually takes torsemide 40 daily but yesterday took 60 because he was swollen.  Echo today EF 30-35% Personally reviewed   Echo 4/19 EF 25% (read as 40-45%)    Review of systems complete and found to be negative unless listed in HPI.    [facility] 05/2016 Showed 70-75% stenosis in the proximal and mid LAD,90% ostial stenosis in the 1st diagonal, and  60-70% stenosis in the mid RCA. [place] 05/2016-  Fick C.O. 5.86/index 2.74  Past Medical History:  Diagnosis Date  . Acid reflux   . Back pain    f4 and f5  . CAD (coronary artery disease)   . CHF (congestive heart failure) ([place])    last echo was in March 2016  . Diabetes mellitus 2005  .  Diverticulosis   . H/O chest pain 2005   ECHO  . HTN (hypertension)   . Hyperlipidemia   . Nonischemic cardiomyopathy ([place])   . Onychomycosis   . Polysubstance abuse ([place])     Current Outpatient Medications  Medication Sig Dispense Refill  . albuterol (PROVENTIL HFA;VENTOLIN HFA) 108 (90 Base) MCG/ACT inhaler Inhale 2 puffs into the lungs every 6 (six) hours as needed for wheezing or shortness of breath. 1 Inhaler 2  . aspirin EC 81 MG tablet Take 81 mg by mouth daily.    . Capsaicin (ICY HOT ARTHRITIS THERAPY EX) Apply 1 application topically 2 (two) times daily as needed (back pain).     . carvedilol (COREG) 12.5 MG tablet TAKE ONE TABLET TWICE A DAY WITH FOOD 180 tablet 1  . empagliflozin (JARDIANCE) 10 MG TABS tablet Take 10 mg by mouth daily. 30 tablet 6  . Ergocalciferol (VITAMIN D2) 400 units TABS Take 1 tablet by mouth daily.    . fluticasone (FLONASE) 50 MCG/ACT nasal spray USE 2 SPRAYS IN EACH NOSTRIL DAILY 48 g 1  . hydrALAZINE (APRESOLINE) 50 MG tablet Take 1 tablet (50 mg total) by mouth 3 (three) times daily. 90 tablet 11  . isosorbide mononitrate (IMDUR) 60 MG 24 hr tablet Take 1.5 tablets (90 mg total) by mouth daily. 135 tablet 1  .  loratadine-pseudoephedrine (CLARITIN-D 12 HOUR) 5-120 MG tablet Take 1 tablet by mouth 2 (two) times daily. 20 tablet 0  . Multiple Vitamins-Minerals (CENTRUM SILVER ADULT 50+ PO) Take 1 tablet by mouth daily.    . nitroGLYCERIN (NITROSTAT) 0.4 MG SL tablet DISSOLVE 1 TAB UNDER TOUNGE FOR CHEST PAIN. MAY REPEAT EVERY 5 MINUTES FOR 3 DOSES. IF NO RELIEF CALL 911 OR GO TO ER 25 tablet 2  . Omega-3 Fatty Acids (FISH OIL) 1000 MG CPDR Take 1 capsule by mouth daily.    [name] VERIO test strip     . pantoprazole (PROTONIX) 40 MG tablet TAKE ONE (1) TABLET EACH DAY 90 tablet 1  . ranolazine (RANEXA) 500 MG 12 hr tablet TAKE ONE TABLET BY MOUTH TWICE DAILY 60 tablet 5  . rosuvastatin (CRESTOR) 10 MG tablet TAKE ONE (1) TABLET EACH DAY 90 tablet 1    . sacubitril-valsartan (ENTRESTO) 97-103 MG Take 1 tablet by mouth 2 (two) times daily. 180 tablet 1  . spironolactone (ALDACTONE) 25 MG tablet Take 0.5 tablets (12.5 mg total) by mouth daily. 45 tablet 3  . terbinafine (LAMISIL) 1 % cream Apply 1 application topically 2 (two) times daily. Apply to both feet and between toes 30 g 1  . torsemide (DEMADEX) 20 MG tablet Take 20 mg by mouth daily.     [name] 300 UNIT/ML SOPN Inject 40 Units into the skin every morning AND 15 Units every evening. 4.5 mL 0  . ULTICARE MINI PEN NEEDLES 31G X 6 MM MISC 4 TIMES DAILY 100 each 2  . metFORMIN (GLUCOPHAGE-XR) 500 MG 24 hr tablet Take 1 tablet (500 mg total) by mouth daily with breakfast. (Patient not taking: Reported on 03/12/2018) 90 tablet 1   Current Facility-Administered Medications  Medication Dose Route Frequency Provider Last Rate Last Dose  . aflibercept (EYLEA) SOLN 2 mg  2 mg Intravitreal  [name], MD   2 mg at 10/20/17 0858  . aflibercept (EYLEA) SOLN 2 mg  2 mg Intravitreal  [name], MD   2 mg at 11/21/17 1214  . aflibercept (EYLEA) SOLN 2 mg  2 mg Intravitreal  [name], MD   2 mg at 12/21/17 0933  . Bevacizumab (AVASTIN) SOLN 1.25 mg  1.25 mg Intravitreal  [name], MD   1.25 mg at 07/19/17 0849  . Bevacizumab (AVASTIN) SOLN 1.25 mg  1.25 mg Intravitreal  [name], MD   1.25 mg at 08/16/17 1232  . Bevacizumab (AVASTIN) SOLN 1.25 mg  1.25 mg Intravitreal  [name], MD   1.25 mg at 09/18/17 1024    Allergies  Allergen Reactions  . Sulfa Antibiotics Shortness Of Breath  . Penicillins Rash    Has patient had a PCN reaction causing immediate rash, facial/tongue/throat swelling, SOB or lightheadedness with hypotension: No Has patient had a PCN reaction causing severe rash involving mucus membranes or skin necrosis: No Has patient had a PCN reaction that required hospitalization No Has patient had a PCN reaction occurring within the last 10 years:  No If all of the above answers are "NO", then may proceed with Cephalosporin use.    Social History   Socioeconomic History  . Marital status: Legally Separated    Spouse name: Not on file  . Number of children: Not on file  . Years of education: Not on file  . Highest education level: Not on file  Occupational History  . Occupation: disabled  Social Needs  . Financial resource strain:  Not on file  . Food insecurity:    Worry: Not on file    Inability: Not on file  . Transportation needs:    Medical: Not on file    Non-medical: Not on file  Tobacco Use  . Smoking status: Never Smoker  . Smokeless tobacco: Current User    Types: Snuff  . Tobacco comment: dips snuff about 6 cans/week for 3 years  Substance and Sexual Activity  . Alcohol use: Yes    Alcohol/week: 1.0 standard drinks    Types: 1 Cans of beer per week    Comment: occ - has cut down, now once every 2-3 months  . Drug use: Yes    Types: Marijuana    Comment: occasional use - smoking less  . Sexual activity: Yes  Lifestyle  . Physical activity:    Days per week: Not on file    Minutes per session: Not on file  . Stress: Not on file  Relationships  . Social connections:    Talks on phone: Not on file    Gets together: Not on file    Attends religious service: Not on file    Active member of club or organization: Not on file    Attends meetings of clubs or organizations: Not on file    Relationship status: Not on file  . Intimate partner violence:    Fear of current or ex partner: Not on file    Emotionally abused: Not on file    Physically abused: Not on file    Forced sexual activity: Not on file  Other Topics Concern  . Not on file  Social History Narrative  . Not on file    Family History  Problem Relation Age of Onset  . Hypertension Father   . Diabetes Father   . Cancer Mother   . Alcohol abuse Brother   . Colon cancer Neg Hx    Vitals:   03/12/18 1106 03/12/18 1109  BP: (!) 160/78 (!)  160/80  Pulse: 72   SpO2: 98%   Weight: 106.8 kg (235 lb 6.4 oz)    Wt Readings from Last 3 Encounters:  03/12/18 106.8 kg (235 lb 6.4 oz)  02/06/18 104.5 kg (230 lb 6.4 oz)  01/25/18 101.9 kg (224 lb 9.6 oz)    PHYSICAL EXAM: General:  Obese Well appearing. No resp difficulty HEENT: normal Neck: supple. JVP 8-9 Carotids 2+ bilat; no bruits. No lymphadenopathy or thryomegaly appreciated. Cor: PMI nondisplaced. Regular rate & rhythm. No rubs, gallops or murmurs. Lungs: clear Abdomen: obese soft, nontender, nondistended. No hepatosplenomegaly. No bruits or masses. Good bowel sounds. Extremities: no cyanosis, clubbing, rash, 1-2+ edema R>L Neuro: alert & orientedx3, cranial nerves grossly intact. moves all 4 extremities w/o difficulty. Affect pleasant   ASSESSMENT & PLAN: 1. 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 He had preexisting NICM diagnosed in 2005 when his EF was 35% and had normal cors. Echo in 05/2016 with EF 30-35% and cath showed  70-75% stenosis in the proximal and mid LAD,90% ostial stenosis in the 1st diagonal, and  60-70% stenosis in the mid RCA. [place] 05/2016-  Fick C.O. 5.86/index 2.74 - Echo from 4/19 reviewed personally EF read as 40-45% but it is more like 25% - Echo today EF 30-35% - Stable NYHA II- early III - Volume status elevated.  - Continue torsemide 40 daily. Will take 60mg  x 2 days - Continue Entresto 97/103 mg BID.  - Continue Coreg 12.5 mg BID may  be able to increase at next visit.  - Continue isosorbide 90 mg daily.  - Continue hydralazine 50 mg TID.  - Continue spiro 25mg  daily - I have reviewed cath films again and LAD lesion is borderline significant. With regional anterior wall motion on echo I think it is worth another look - Reinforced fluid restriction to < 2 L daily, sodium restriction to less than 2000 mg daily, and the importance of daily weights.    - Will refer for ICD if no stent on cath - Continue Jardiance.   2. CAD - [facility] 05/2016  showed 70-75% stenosis in the proximal and mid LAD,90% ostial stenosis in the 1st diagonal, and  60-70% stenosis in the mid RCA. [place] 05/2016-  Fick C.O. 5.86/index 2.74 Medical management recommended.  - CVTS consulted felt that CABG would not improve EF as he has pre existing CM.  - I have reviewed cath films again and LAD lesion is borderline significant. EF improving overall but still with regional anterior wall motion on echo and I think it is worth another look. We discussed it and will plan on doing R/L cath in 1-2 weeks if renal function stable.  - Continue isosorbide and Ranexa. - Continue ASA and statin.   3. ETOH abuse - Congratulated him on cessation   4. Tobacco abuse - Chews tobacco.  - Encouraged complete cessation.   5. HTN - Blood pressure high today. Has not taken meds yet this. Says SBP typically 120-130s. Will follow.   6. CKD stage III - Baseline creatinine 1.7-2.2.  - Follows with Dr. Hinda Lenis - Will recheck today  7. DM - Per PCP. - Will add Jardiance 10   8. Fatigue - pending sleep study next week   Total time spent 35 minutes. Over half that time spent discussing above.   Glori Bickers, MD 03/12/18

## 2018-03-12 NOTE — Patient Instructions (Addendum)
INCREASE Torsemide to 60mg  (3 tabs) daily for 2 days.  Labs were done today. We will call you with any ABNORMAL results. No news is good news!  Your physician recommends that you schedule a follow-up appointment in 3 months.  Your physician has requested that you have a cardiac catheterization. Cardiac catheterization is used to diagnose and/or treat various heart conditions. Doctors may recommend this procedure for a number of different reasons. The most common reason is to evaluate chest pain. Chest pain can be a symptom of coronary artery disease (CAD), and cardiac catheterization can show whether plaque is narrowing or blocking your heart's arteries. This procedure is also used to evaluate the valves, as well as measure the blood flow and oxygen levels in different parts of your heart. For further information please visit [email or web address]. Please follow instruction sheet, as given.  Your right/left cath is scheduled for 27 Janurary 2020 at 10:30 am, please arrive 2 hours early for the procedure.   [facility] [place] [id number] [place] [id number] Dept: [phone] Loc: [phone]  JONUS COBLE  03/12/2018  You are scheduled for a Cardiac Catheterization on Monday, January 27 with Dr. Glori Bickers.  1. Please arrive at the [facility] (Main Entrance A) at [facility]: [address] [place], [place] [address] at 8:30 AM (This time is two hours before your procedure to ensure your preparation). Free valet parking service is available.   Special note: Every effort is made to have your procedure done on time. Please understand that emergencies sometimes delay scheduled procedures.  2. Diet: Do not eat solid foods after midnight.  The patient may have clear liquids until 5am upon the day of the procedure.  3. Labs: You had labs drawn today.  4. Medication instructions in  preparation for your procedure:   Contrast Allergy: No    Stop taking, Torsemide (Demadex) Monday, January 27, Stop taking Spirolactone Monday, January 27 Hold Jardiance the morning of the procedure.  Hold the morning dose of INSULIN ([place])   Do not take Diabetes Med Glucophage (Metformin) on the day of the procedure and HOLD 48 HOURS AFTER THE PROCEDURE.  On the morning of your procedure, take your Aspirin and any morning medicines NOT listed above.  You may use sips of water.  5. Plan for one night stay--bring personal belongings. 6. Bring a current list of your medications and current insurance cards. 7. You MUST have a responsible person to drive you home. 8. Someone MUST be with you the first 24 hours after you arrive home or your discharge will be delayed. 9. Please wear clothes that are easy to get on and off and wear slip-on shoes.  Thank you for allowing [place] to care for you!   -- [facility]

## 2018-03-12 NOTE — Progress Notes (Signed)
  Echocardiogram 2D Echocardiogram has been performed.  Jennette Dubin 03/12/2018, 10:51 AM

## 2018-03-13 ENCOUNTER — Ambulatory Visit: Payer: Medicare Other | Admitting: Nutrition

## 2018-03-16 ENCOUNTER — Encounter (INDEPENDENT_AMBULATORY_CARE_PROVIDER_SITE_OTHER): Payer: Medicare Other | Admitting: Ophthalmology

## 2018-03-19 ENCOUNTER — Ambulatory Visit: Payer: Medicare Other | Admitting: Nutrition

## 2018-03-19 ENCOUNTER — Other Ambulatory Visit: Payer: Self-pay

## 2018-03-19 ENCOUNTER — Inpatient Hospital Stay (HOSPITAL_COMMUNITY)
Admission: AD | Admit: 2018-03-19 | Discharge: 2018-03-27 | DRG: 246 | Disposition: A | Discharge status: Home Health | Payer: Medicare Other | Attending: Internal Medicine | Admitting: Internal Medicine

## 2018-03-19 ENCOUNTER — Inpatient Hospital Stay (HOSPITAL_COMMUNITY)
Admission: AD | Disposition: A | Discharge status: Home Health | Payer: Self-pay | Source: Home / Self Care | Attending: Internal Medicine

## 2018-03-19 DIAGNOSIS — I472 Ventricular tachycardia: Secondary | ICD-10-CM | POA: Diagnosis present

## 2018-03-19 DIAGNOSIS — Z79899 Other long term (current) drug therapy: Secondary | ICD-10-CM

## 2018-03-19 DIAGNOSIS — E669 Obesity, unspecified: Secondary | ICD-10-CM | POA: Diagnosis present

## 2018-03-19 DIAGNOSIS — I10 Essential (primary) hypertension: Secondary | ICD-10-CM

## 2018-03-19 DIAGNOSIS — Z6834 Body mass index (BMI) 34.0-34.9, adult: Secondary | ICD-10-CM | POA: Diagnosis not present

## 2018-03-19 DIAGNOSIS — Z955 Presence of coronary angioplasty implant and graft: Secondary | ICD-10-CM

## 2018-03-19 DIAGNOSIS — Z833 Family history of diabetes mellitus: Secondary | ICD-10-CM | POA: Diagnosis not present

## 2018-03-19 DIAGNOSIS — F17220 Nicotine dependence, chewing tobacco, uncomplicated: Secondary | ICD-10-CM | POA: Diagnosis present

## 2018-03-19 DIAGNOSIS — E785 Hyperlipidemia, unspecified: Secondary | ICD-10-CM | POA: Diagnosis present

## 2018-03-19 DIAGNOSIS — E1122 Type 2 diabetes mellitus with diabetic chronic kidney disease: Secondary | ICD-10-CM | POA: Diagnosis present

## 2018-03-19 DIAGNOSIS — Z882 Allergy status to sulfonamides status: Secondary | ICD-10-CM

## 2018-03-19 DIAGNOSIS — I5023 Acute on chronic systolic (congestive) heart failure: Secondary | ICD-10-CM | POA: Diagnosis present

## 2018-03-19 DIAGNOSIS — N179 Acute kidney failure, unspecified: Secondary | ICD-10-CM | POA: Diagnosis present

## 2018-03-19 DIAGNOSIS — Z7982 Long term (current) use of aspirin: Secondary | ICD-10-CM | POA: Diagnosis not present

## 2018-03-19 DIAGNOSIS — Z811 Family history of alcohol abuse and dependence: Secondary | ICD-10-CM | POA: Diagnosis not present

## 2018-03-19 DIAGNOSIS — N183 Chronic kidney disease, stage 3 (moderate): Secondary | ICD-10-CM | POA: Diagnosis present

## 2018-03-19 DIAGNOSIS — Z794 Long term (current) use of insulin: Secondary | ICD-10-CM | POA: Diagnosis not present

## 2018-03-19 DIAGNOSIS — I272 Pulmonary hypertension, unspecified: Secondary | ICD-10-CM | POA: Diagnosis present

## 2018-03-19 DIAGNOSIS — Z8249 Family history of ischemic heart disease and other diseases of the circulatory system: Secondary | ICD-10-CM

## 2018-03-19 DIAGNOSIS — Z880 Allergy status to penicillin: Secondary | ICD-10-CM

## 2018-03-19 DIAGNOSIS — K219 Gastro-esophageal reflux disease without esophagitis: Secondary | ICD-10-CM | POA: Diagnosis present

## 2018-03-19 DIAGNOSIS — F1010 Alcohol abuse, uncomplicated: Secondary | ICD-10-CM | POA: Diagnosis present

## 2018-03-19 DIAGNOSIS — Z23 Encounter for immunization: Secondary | ICD-10-CM

## 2018-03-19 DIAGNOSIS — E118 Type 2 diabetes mellitus with unspecified complications: Secondary | ICD-10-CM

## 2018-03-19 DIAGNOSIS — I130 Hypertensive heart and chronic kidney disease with heart failure and stage 1 through stage 4 chronic kidney disease, or unspecified chronic kidney disease: Secondary | ICD-10-CM | POA: Diagnosis present

## 2018-03-19 DIAGNOSIS — I428 Other cardiomyopathies: Secondary | ICD-10-CM | POA: Diagnosis present

## 2018-03-19 DIAGNOSIS — I25119 Atherosclerotic heart disease of native coronary artery with unspecified angina pectoris: Secondary | ICD-10-CM | POA: Diagnosis not present

## 2018-03-19 DIAGNOSIS — I2510 Atherosclerotic heart disease of native coronary artery without angina pectoris: Secondary | ICD-10-CM

## 2018-03-19 HISTORY — PX: RIGHT/LEFT HEART CATH AND CORONARY ANGIOGRAPHY: CATH118266

## 2018-03-19 HISTORY — PX: CORONARY STENT INTERVENTION: CATH118234

## 2018-03-19 LAB — GLUCOSE, CAPILLARY
Glucose-Capillary: 158 mg/dL — ABNORMAL HIGH (ref 70–99)
Glucose-Capillary: 125 mg/dL — ABNORMAL HIGH (ref 70–99)
Glucose-Capillary: 202 mg/dL — ABNORMAL HIGH (ref 70–99)
Glucose-Capillary: 180 mg/dL — ABNORMAL HIGH (ref 70–99)

## 2018-03-19 LAB — POCT I-STAT 7, (LYTES, BLD GAS, ICA,H+H)
Bicarbonate: 23.5 mmol/L (ref 20.0–28.0)
Calcium, Ion: 1.1 mmol/L — ABNORMAL LOW (ref 1.15–1.40)
HEMATOCRIT: 24 % — AB (ref 39.0–52.0)
Hemoglobin: 8.2 g/dL — ABNORMAL LOW (ref 13.0–17.0)
O2 Saturation: 95 %
Potassium: 4 mmol/L (ref 3.5–5.1)
SODIUM: 144 mmol/L (ref 135–145)
TCO2: 25 mmol/L (ref 22–32)
pCO2 arterial: 34.6 mmHg (ref 32.0–48.0)
pH, Arterial: 7.441 (ref 7.350–7.450)
pO2, Arterial: 70 mmHg — ABNORMAL LOW (ref 83.0–108.0)

## 2018-03-19 LAB — POCT I-STAT EG7
Acid-Base Excess: 1 mmol/L (ref 0.0–2.0)
Acid-base deficit: 1 mmol/L (ref 0.0–2.0)
Bicarbonate: 23.5 mmol/L (ref 20.0–28.0)
Bicarbonate: 26 mmol/L (ref 20.0–28.0)
Calcium, Ion: 1.06 mmol/L — ABNORMAL LOW (ref 1.15–1.40)
Calcium, Ion: 1.17 mmol/L (ref 1.15–1.40)
HCT: 24 % — ABNORMAL LOW (ref 39.0–52.0)
HCT: 25 % — ABNORMAL LOW (ref 39.0–52.0)
HEMOGLOBIN: 8.2 g/dL — AB (ref 13.0–17.0)
Hemoglobin: 8.5 g/dL — ABNORMAL LOW (ref 13.0–17.0)
O2 Saturation: 62 %
O2 Saturation: 65 %
POTASSIUM: 3.9 mmol/L (ref 3.5–5.1)
Potassium: 4.2 mmol/L (ref 3.5–5.1)
Sodium: 143 mmol/L (ref 135–145)
Sodium: 145 mmol/L (ref 135–145)
TCO2: 25 mmol/L (ref 22–32)
TCO2: 27 mmol/L (ref 22–32)
pCO2, Ven: 37 mmHg — ABNORMAL LOW (ref 44.0–60.0)
pCO2, Ven: 40.4 mmHg — ABNORMAL LOW (ref 44.0–60.0)
pH, Ven: 7.41 (ref 7.250–7.430)
pH, Ven: 7.416 (ref 7.250–7.430)
pO2, Ven: 32 mmHg (ref 32.0–45.0)
pO2, Ven: 33 mmHg (ref 32.0–45.0)

## 2018-03-19 LAB — CBC
HCT: 32.4 % — ABNORMAL LOW (ref 39.0–52.0)
Hemoglobin: 9.9 g/dL — ABNORMAL LOW (ref 13.0–17.0)
MCH: 26 pg (ref 26.0–34.0)
MCHC: 30.6 g/dL (ref 30.0–36.0)
MCV: 85 fL (ref 80.0–100.0)
Platelets: 218 10*3/uL (ref 150–400)
RBC: 3.81 MIL/uL — ABNORMAL LOW (ref 4.22–5.81)
RDW: 13.6 % (ref 11.5–15.5)
WBC: 6.6 10*3/uL (ref 4.0–10.5)
nRBC: 0 % (ref 0.0–0.2)

## 2018-03-19 LAB — CREATININE, SERUM
Creatinine, Ser: 1.83 mg/dL — ABNORMAL HIGH (ref 0.61–1.24)
GFR calc Af Amer: 45 mL/min — ABNORMAL LOW (ref >60)
GFR calc non Af Amer: 39 mL/min — ABNORMAL LOW (ref >60)

## 2018-03-19 LAB — POCT ACTIVATED CLOTTING TIME
Activated Clotting Time: 246 seconds
Activated Clotting Time: 296 seconds

## 2018-03-19 LAB — HEMOGLOBIN A1C
HEMOGLOBIN A1C: 11.7 % — AB (ref 4.8–5.6)
MEAN PLASMA GLUCOSE: 289.09 mg/dL

## 2018-03-19 SURGERY — RIGHT/LEFT HEART CATH AND CORONARY ANGIOGRAPHY
Anesthesia: LOCAL

## 2018-03-19 MED ORDER — ACETAMINOPHEN 325 MG PO TABS
650.0000 mg | ORAL_TABLET | ORAL | Status: DC | PRN
  Administered 2018-03-20 – 2018-03-24 (×6): 650 mg via ORAL
  Filled 2018-03-19 (×6): qty 2

## 2018-03-19 MED ORDER — ASPIRIN 81 MG PO CHEW: 81.0000 mg | CHEWABLE_TABLET | ORAL | Status: DC

## 2018-03-19 MED ORDER — ENOXAPARIN SODIUM 40 MG/0.4ML ~~LOC~~ SOLN
40.0000 mg | SUBCUTANEOUS | Status: DC
  Administered 2018-03-20 – 2018-03-26 (×6): 40 mg via SUBCUTANEOUS
  Filled 2018-03-19 (×6): qty 0.4

## 2018-03-19 MED ORDER — LIDOCAINE HCL (PF) 1 % IJ SOLN
INTRAMUSCULAR | Status: DC | PRN
  Administered 2018-03-19 (×2): 2 mL

## 2018-03-19 MED ORDER — TICAGRELOR 90 MG PO TABS
90.0000 mg | ORAL_TABLET | Freq: Two times a day (BID) | ORAL | Status: DC
  Administered 2018-03-20 – 2018-03-27 (×16): 90 mg via ORAL
  Filled 2018-03-19 (×16): qty 1

## 2018-03-19 MED ORDER — HEPARIN (PORCINE) IN NACL 1000-0.9 UT/500ML-% IV SOLN
INTRAVENOUS | Status: AC
  Filled 2018-03-19: qty 1000

## 2018-03-19 MED ORDER — IOHEXOL 350 MG/ML SOLN
INTRAVENOUS | Status: DC | PRN
  Administered 2018-03-19: 50 mL via INTRACARDIAC
  Administered 2018-03-19: 55 mL via INTRACARDIAC

## 2018-03-19 MED ORDER — RANOLAZINE ER 500 MG PO TB12
500.0000 mg | ORAL_TABLET | Freq: Two times a day (BID) | ORAL | Status: DC
  Administered 2018-03-19 – 2018-03-27 (×16): 500 mg via ORAL
  Filled 2018-03-19 (×16): qty 1

## 2018-03-19 MED ORDER — TICAGRELOR 90 MG PO TABS
ORAL_TABLET | ORAL | Status: AC
  Filled 2018-03-19: qty 2

## 2018-03-19 MED ORDER — MIDAZOLAM HCL 2 MG/2ML IJ SOLN
INTRAMUSCULAR | Status: AC
  Filled 2018-03-19: qty 2

## 2018-03-19 MED ORDER — CARVEDILOL 12.5 MG PO TABS
12.5000 mg | ORAL_TABLET | Freq: Two times a day (BID) | ORAL | Status: DC
  Administered 2018-03-19 – 2018-03-23 (×9): 12.5 mg via ORAL
  Filled 2018-03-19 (×9): qty 1

## 2018-03-19 MED ORDER — ALBUTEROL SULFATE (2.5 MG/3ML) 0.083% IN NEBU
2.5000 mg | INHALATION_SOLUTION | Freq: Four times a day (QID) | RESPIRATORY_TRACT | Status: DC | PRN
  Administered 2018-03-19 – 2018-03-22 (×3): 2.5 mg via RESPIRATORY_TRACT
  Filled 2018-03-19 (×4): qty 3

## 2018-03-19 MED ORDER — HEPARIN (PORCINE) IN NACL 1000-0.9 UT/500ML-% IV SOLN
INTRAVENOUS | Status: DC | PRN
  Administered 2018-03-19 (×3): 500 mL

## 2018-03-19 MED ORDER — HYDRALAZINE HCL 20 MG/ML IJ SOLN
INTRAMUSCULAR | Status: DC | PRN
  Administered 2018-03-19: 10 mg via INTRAVENOUS

## 2018-03-19 MED ORDER — NITROGLYCERIN 1 MG/10 ML FOR IR/CATH LAB
INTRA_ARTERIAL | Status: DC | PRN
  Administered 2018-03-19: 200 ug via INTRACORONARY

## 2018-03-19 MED ORDER — ONDANSETRON HCL 4 MG/2ML IJ SOLN: 4.0000 mg | Freq: Four times a day (QID) | INTRAMUSCULAR | Status: DC | PRN

## 2018-03-19 MED ORDER — LIVING BETTER WITH HEART FAILURE BOOK
Freq: Once | Status: AC
  Administered 2018-03-20: 01:00:00 1

## 2018-03-19 MED ORDER — SODIUM CHLORIDE 0.9 % IV SOLN: 250.0000 mL | INTRAVENOUS | Status: DC | PRN

## 2018-03-19 MED ORDER — ISOSORBIDE MONONITRATE ER 60 MG PO TB24
90.0000 mg | ORAL_TABLET | Freq: Every day | ORAL | Status: DC
  Administered 2018-03-20 – 2018-03-21 (×2): 90 mg via ORAL
  Filled 2018-03-19 (×3): qty 1

## 2018-03-19 MED ORDER — ANGIOPLASTY BOOK
Freq: Once | Status: AC
  Administered 2018-03-20: 01:00:00 1
  Filled 2018-03-19: qty 1

## 2018-03-19 MED ORDER — TICAGRELOR 90 MG PO TABS
ORAL_TABLET | ORAL | Status: DC | PRN
  Administered 2018-03-19: 180 mg via ORAL

## 2018-03-19 MED ORDER — NITROGLYCERIN 0.4 MG SL SUBL
0.4000 mg | SUBLINGUAL_TABLET | SUBLINGUAL | Status: DC | PRN
  Administered 2018-03-22 (×4): 0.4 mg via SUBLINGUAL
  Filled 2018-03-19 (×3): qty 1

## 2018-03-19 MED ORDER — BLOOD PRESSURE CONTROL BOOK
Freq: Once | Status: AC
  Administered 2018-03-20: 01:00:00 1
  Filled 2018-03-19: qty 1

## 2018-03-19 MED ORDER — SODIUM CHLORIDE 0.9% FLUSH: 3.0000 mL | Freq: Two times a day (BID) | INTRAVENOUS | Status: DC

## 2018-03-19 MED ORDER — ACETAMINOPHEN 325 MG PO TABS: 650.0000 mg | ORAL_TABLET | ORAL | Status: DC | PRN

## 2018-03-19 MED ORDER — FENTANYL CITRATE (PF) 100 MCG/2ML IJ SOLN
INTRAMUSCULAR | Status: AC
  Filled 2018-03-19: qty 2

## 2018-03-19 MED ORDER — SODIUM CHLORIDE 0.9 % IV SOLN
INTRAVENOUS | Status: DC
  Administered 2018-03-19: 09:00:00 via INTRAVENOUS

## 2018-03-19 MED ORDER — SODIUM CHLORIDE 0.9% FLUSH
3.0000 mL | Freq: Two times a day (BID) | INTRAVENOUS | Status: DC
  Administered 2018-03-20 – 2018-03-24 (×5): 3 mL via INTRAVENOUS

## 2018-03-19 MED ORDER — HYDRALAZINE HCL 20 MG/ML IJ SOLN
INTRAMUSCULAR | Status: AC
  Filled 2018-03-19: qty 1

## 2018-03-19 MED ORDER — SODIUM CHLORIDE 0.9% FLUSH: 3.0000 mL | INTRAVENOUS | Status: DC | PRN

## 2018-03-19 MED ORDER — HEPARIN SODIUM (PORCINE) 1000 UNIT/ML IJ SOLN
INTRAMUSCULAR | Status: AC
  Filled 2018-03-19: qty 1

## 2018-03-19 MED ORDER — ROSUVASTATIN CALCIUM 5 MG PO TABS
10.0000 mg | ORAL_TABLET | Freq: Every day | ORAL | Status: DC
  Administered 2018-03-20 – 2018-03-27 (×8): 10 mg via ORAL
  Filled 2018-03-19 (×8): qty 2

## 2018-03-19 MED ORDER — NITROGLYCERIN 1 MG/10 ML FOR IR/CATH LAB
INTRA_ARTERIAL | Status: AC
  Filled 2018-03-19: qty 10

## 2018-03-19 MED ORDER — SODIUM CHLORIDE 0.9% FLUSH
3.0000 mL | Freq: Two times a day (BID) | INTRAVENOUS | Status: DC
  Administered 2018-03-19 – 2018-03-26 (×8): 3 mL via INTRAVENOUS

## 2018-03-19 MED ORDER — THE SENSUOUS HEART BOOK
Freq: Once | Status: AC
  Administered 2018-03-20: 01:00:00 1
  Filled 2018-03-19: qty 1

## 2018-03-19 MED ORDER — VERAPAMIL HCL 2.5 MG/ML IV SOLN
INTRAVENOUS | Status: AC
  Filled 2018-03-19: qty 2

## 2018-03-19 MED ORDER — HYDRALAZINE HCL 50 MG PO TABS
50.0000 mg | ORAL_TABLET | Freq: Three times a day (TID) | ORAL | Status: DC
  Administered 2018-03-19 – 2018-03-23 (×11): 50 mg via ORAL
  Filled 2018-03-19 (×11): qty 1

## 2018-03-19 MED ORDER — FUROSEMIDE 10 MG/ML IJ SOLN
80.0000 mg | Freq: Once | INTRAMUSCULAR | Status: AC
  Administered 2018-03-19: 16:00:00 80 mg via INTRAVENOUS
  Filled 2018-03-19: qty 8

## 2018-03-19 MED ORDER — ASPIRIN 81 MG PO CHEW
81.0000 mg | CHEWABLE_TABLET | Freq: Every day | ORAL | Status: DC
  Administered 2018-03-20 – 2018-03-27 (×8): 81 mg via ORAL
  Filled 2018-03-19 (×8): qty 1

## 2018-03-19 MED ORDER — VERAPAMIL HCL 2.5 MG/ML IV SOLN
INTRAVENOUS | Status: DC | PRN
  Administered 2018-03-19: 10 mL via INTRA_ARTERIAL

## 2018-03-19 MED ORDER — PANTOPRAZOLE SODIUM 40 MG PO TBEC
40.0000 mg | DELAYED_RELEASE_TABLET | Freq: Every day | ORAL | Status: DC
  Administered 2018-03-20 – 2018-03-27 (×8): 40 mg via ORAL
  Filled 2018-03-19 (×8): qty 1

## 2018-03-19 MED ORDER — LABETALOL HCL 5 MG/ML IV SOLN: 10.0000 mg | INTRAVENOUS | Status: AC | PRN

## 2018-03-19 MED ORDER — LIDOCAINE HCL (PF) 1 % IJ SOLN
INTRAMUSCULAR | Status: AC
  Filled 2018-03-19: qty 30

## 2018-03-19 MED ORDER — HEPARIN SODIUM (PORCINE) 1000 UNIT/ML IJ SOLN
INTRAMUSCULAR | Status: DC | PRN
  Administered 2018-03-19: 6000 [IU] via INTRAVENOUS
  Administered 2018-03-19: 3000 [IU] via INTRAVENOUS
  Administered 2018-03-19: 5000 [IU] via INTRAVENOUS

## 2018-03-19 MED ORDER — SPIRONOLACTONE 12.5 MG HALF TABLET
12.5000 mg | ORAL_TABLET | Freq: Every day | ORAL | Status: DC
  Administered 2018-03-20 – 2018-03-26 (×7): 12.5 mg via ORAL
  Filled 2018-03-19 (×7): qty 1

## 2018-03-19 MED ORDER — SODIUM CHLORIDE 0.9 % IV SOLN: INTRAVENOUS | Status: AC

## 2018-03-19 MED ORDER — INSULIN ASPART 100 UNIT/ML ~~LOC~~ SOLN
0.0000 [IU] | Freq: Three times a day (TID) | SUBCUTANEOUS | Status: DC
  Administered 2018-03-19: 3 [IU] via SUBCUTANEOUS
  Administered 2018-03-20: 5 [IU] via SUBCUTANEOUS
  Administered 2018-03-21 – 2018-03-22 (×4): 3 [IU] via SUBCUTANEOUS
  Administered 2018-03-22: 2 [IU] via SUBCUTANEOUS
  Administered 2018-03-23 (×2): 5 [IU] via SUBCUTANEOUS
  Administered 2018-03-23: 3 [IU] via SUBCUTANEOUS
  Administered 2018-03-24: 2 [IU] via SUBCUTANEOUS
  Administered 2018-03-24: 3 [IU] via SUBCUTANEOUS
  Administered 2018-03-24: 5 [IU] via SUBCUTANEOUS
  Administered 2018-03-25: 2 [IU] via SUBCUTANEOUS
  Administered 2018-03-25: 3 [IU] via SUBCUTANEOUS
  Administered 2018-03-25: 5 [IU] via SUBCUTANEOUS
  Administered 2018-03-26: 2 [IU] via SUBCUTANEOUS
  Administered 2018-03-26: 8 [IU] via SUBCUTANEOUS
  Administered 2018-03-26: 3 [IU] via SUBCUTANEOUS
  Administered 2018-03-27: 5 [IU] via SUBCUTANEOUS

## 2018-03-19 MED ORDER — HYDRALAZINE HCL 20 MG/ML IJ SOLN
5.0000 mg | INTRAMUSCULAR | Status: AC | PRN
  Filled 2018-03-19: qty 1

## 2018-03-19 SURGICAL SUPPLY — 19 items
BALLN SAPPHIRE 2.0X10 (BALLOONS) ×2
BALLN SAPPHIRE ~~LOC~~ 2.75X12 (BALLOONS) ×2 IMPLANT
BALLOON SAPPHIRE 2.0X10 (BALLOONS) ×1 IMPLANT
CATH 5FR JL3.5 JR4 ANG PIG MP (CATHETERS) ×2 IMPLANT
CATH BALLN WEDGE 5F 110CM (CATHETERS) ×2 IMPLANT
CATH VISTA GUIDE 6FR JR4 (CATHETERS) ×2 IMPLANT
DEVICE RAD COMP TR BAND LRG (VASCULAR PRODUCTS) ×2 IMPLANT
GLIDESHEATH SLEND A-KIT 6F 20G (SHEATH) IMPLANT
GLIDESHEATH SLEND SS 6F .021 (SHEATH) ×2 IMPLANT
GUIDEWIRE INQWIRE 1.5J.035X260 (WIRE) ×1 IMPLANT
INQWIRE 1.5J .035X260CM (WIRE) ×2
KIT ENCORE 26 ADVANTAGE (KITS) ×2 IMPLANT
KIT HEART LEFT (KITS) ×2 IMPLANT
PACK CARDIAC CATHETERIZATION (CUSTOM PROCEDURE TRAY) ×2 IMPLANT
SHEATH GLIDE SLENDER 4/5FR (SHEATH) ×2 IMPLANT
STENT SYNERGY DES 2.5X16 (Permanent Stent) ×2 IMPLANT
TRANSDUCER W/STOPCOCK (MISCELLANEOUS) ×2 IMPLANT
TUBING CIL FLEX 10 FLL-RA (TUBING) ×2 IMPLANT
WIRE ASAHI PROWATER 180CM (WIRE) ×2 IMPLANT

## 2018-03-19 NOTE — H&P (Signed)
Advanced Heart Failure Clinic Note   Primary Care: Dr. Alain Honey PCP-Cardiologist:   HPI: Brad Mendoza is a 63 y.o. male with a past medical history of systolic HF due to NICM, HTN, HLD, DM and CAD.   [place] June 2005 with 40-50% mid LAD stenosis, EF 45% at that time.    Admitted on 06/01/16 with chest pain relieved with nitroglycerin and volume overload. Troponin negative. Admission weight was 230 pounds. Right and left heart cath on 06/03/16 with 70-75% stenosis in the proximal and mid LAD,90% ostial stenosis in the 1st diagonal, and  60-70% stenosis in the mid RCA. RHC showed Fick C.O. 5.86/index 2.74. PA mean 33 mm Hg, LVEDP 25 mm Hg. EF by cath appears to be <25%. Medical therapy was recommended in the absence of ACS, and PCI was felt to be high risk with severe cardiomyopathy. CVTS was consulted and felt that CABG would not benefit him as he had NICM prior to his CAD. Discharge weight 207 pounds.   We saw him recently in clinic and was c/o chest pressure and SOB. Scheduled for R/L cath today to re-evaluate CAD and for RHC.   In cath lab found to have. Underwent PCI/DES to RCA by Dr. Ellyn Hack and admitted for diuresis and post-cath f/u.    Ost 1st Diag lesion is 95% stenosed.  Prox LAD to Mid LAD lesion is 60% stenosed. (stable)  2nd Diag lesion is 70% stenosed.  Mid Cx to Dist Cx lesion is 30% stenosed.  Mid RCA lesion is 70% stenosed.  Prox RCA lesion is 90% stenosed. (worse from previous)   Findings:   Ao = 153/81 (111) LV = 144/30 RA = 11 RV = 77/12 PA = 76/30 (50) PCW = 29 Fick cardiac output/index = 6.8/3.1 PVR = 3.0 WU Ao sat = 95% PA sat = 62%, 65%  Assessment: 1. 2v CAD with high-grade lesion in proximal RCA and first diagonal 2. The RCA lesion has progressed from previous.  3. There is stable moderate CAD in LAD 4. EF 30-35% by echo with elevated filling pressures 5. Moderate mixed pulmonary HTN     Echo 03/12/17  EF 30-35% Personally reviewed    Echo 4/19 EF 25% (read as 40-45%)    Review of systems complete and found to be negative unless listed in HPI.    [facility] 05/2016 Showed 70-75% stenosis in the proximal and mid LAD,90% ostial stenosis in the 1st diagonal, and  60-70% stenosis in the mid RCA. [place] 05/2016-  Fick C.O. 5.86/index 2.74  Past Medical History:  Diagnosis Date  . Acid reflux   . Back pain    f4 and f5  . CAD (coronary artery disease)   . CHF (congestive heart failure) ([place])    last echo was in March 2016  . Diabetes mellitus 2005  . Diverticulosis   . H/O chest pain 2005   ECHO  . HTN (hypertension)   . Hyperlipidemia   . Nonischemic cardiomyopathy ([place])   . Onychomycosis   . Polysubstance abuse ([place])     Current Facility-Administered Medications  Medication Dose Route Frequency Provider Last Rate Last Dose  . 0.9 %  sodium chloride infusion  250 mL Intravenous PRN Tjay Velazquez, Shaune Pascal, MD      . 0.9 %  sodium chloride infusion   Intravenous Continuous Steffany Schoenfelder, Shaune Pascal, MD 10 mL/hr at 03/19/18 [phone]    . aspirin chewable tablet 81 mg  81 mg Oral Pre-Cath Deysi Soldo, Shaune Pascal, MD      .  Heparin (Porcine) in NaCl 1000-0.9 UT/500ML-% SOLN    PRN Parris Cudworth, Shaune Pascal, MD   500 mL at 03/19/18 1218  . heparin injection    PRN Nandi Tonnesen, Shaune Pascal, MD   3,000 Units at 03/19/18 1305  . hydrALAZINE (APRESOLINE) injection    PRN Jahnavi Muratore, Shaune Pascal, MD   10 mg at 03/19/18 1238  . iohexol (OMNIPAQUE) 350 MG/ML injection    PRN Turquoise Esch, Shaune Pascal, MD   55 mL at 03/19/18 1250  . lidocaine (PF) (XYLOCAINE) 1 % injection    PRN Nicoletta Hush, Shaune Pascal, MD   2 mL at 03/19/18 1211  . Radial Cocktail/Verapamil only    PRN Leeba Barbe, Shaune Pascal, MD   10 mL at 03/19/18 1213  . sodium chloride flush (NS) 0.9 % injection 3 mL  3 mL Intravenous Q12H Gurneet Matarese R, MD      . sodium chloride flush (NS) 0.9 % injection 3 mL  3 mL Intravenous PRN Tessica Cupo, Shaune Pascal, MD      . ticagrelor Kary Kos) tablet    PRN Leonie Man,  MD   180 mg at 03/19/18 1254    Allergies  Allergen Reactions  . Sulfa Antibiotics Shortness Of Breath  . Penicillins Rash and Other (See Comments)    Has patient had a PCN reaction causing immediate rash, facial/tongue/throat swelling, SOB or lightheadedness with hypotension: No Has patient had a PCN reaction causing severe rash involving mucus membranes or skin necrosis: No Has patient had a PCN reaction that required hospitalization No Has patient had a PCN reaction occurring within the last 10 years: No If all of the above answers are "NO", then may proceed with Cephalosporin use.    Social History   Socioeconomic History  . Marital status: Legally Separated    Spouse name: Not on file  . Number of children: Not on file  . Years of education: Not on file  . Highest education level: Not on file  Occupational History  . Occupation: disabled  Social Needs  . Financial resource strain: Not on file  . Food insecurity:    Worry: Not on file    Inability: Not on file  . Transportation needs:    Medical: Not on file    Non-medical: Not on file  Tobacco Use  . Smoking status: Never Smoker  . Smokeless tobacco: Current User    Types: Snuff  . Tobacco comment: dips snuff about 6 cans/week for 3 years  Substance and Sexual Activity  . Alcohol use: Yes    Alcohol/week: 1.0 standard drinks    Types: 1 Cans of beer per week    Comment: occ - has cut down, now once every 2-3 months  . Drug use: Yes    Types: Marijuana    Comment: occasional use - smoking less  . Sexual activity: Yes  Lifestyle  . Physical activity:    Days per week: Not on file    Minutes per session: Not on file  . Stress: Not on file  Relationships  . Social connections:    Talks on phone: Not on file    Gets together: Not on file    Attends religious service: Not on file    Active member of club or organization: Not on file    Attends meetings of clubs or organizations: Not on file    Relationship  status: Not on file  . Intimate partner violence:    Fear of current or ex partner: Not on file  Emotionally abused: Not on file    Physically abused: Not on file    Forced sexual activity: Not on file  Other Topics Concern  . Not on file  Social History Narrative  . Not on file    Family History  Problem Relation Age of Onset  . Hypertension Father   . Diabetes Father   . Cancer Mother   . Alcohol abuse Brother   . Colon cancer Neg Hx    Vitals:   03/19/18 0840 03/19/18 1130  BP: (!) 155/94   Pulse: 76   Resp: 18   Temp: 98.3 F (36.8 C)   SpO2: (!) 18% 100%  Weight: 106.6 kg   Height: 5\' 9"  (1.753 m)    Wt Readings from Last 3 Encounters:  03/19/18 106.6 kg  03/12/18 106.8 kg  02/06/18 104.5 kg    PHYSICAL EXAM: General:  Well appearing. No resp difficulty HEENT: normal Neck: supple. JVP to jaw . Carotids 2+ bilat; no bruits. No lymphadenopathy or thryomegaly appreciated. Cor: PMI nondisplaced. Regular rate & rhythm. No rubs, gallops or murmurs. Lungs: clear Abdomen: obese soft, nontender, nondistended. No hepatosplenomegaly. No bruits or masses. Good bowel sounds. Extremities: no cyanosis, clubbing, rash, tr edema Neuro: alert & orientedx3, cranial nerves grossly intact. moves all 4 extremities w/o difficulty. Affect pleasant    ASSESSMENT & PLAN: 1. 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 He had preexisting NICM diagnosed in 2005 when his EF was 35% and had normal cors. Echo in 05/2016 with EF 30-35% and cath showed  70-75% stenosis in the proximal and mid LAD,90% ostial stenosis in the 1st diagonal, and  60-70% stenosis in the mid RCA. [place] 05/2016-  Fick C.O. 5.86/index 2.74 - Echo from 4/19 reviewed personally EF read as 40-45% but it is more like 25% - Echo 03/12/17 EF 30-35% - Stable NYHA II- early III - Volume status elevated on cath today. Will admit for IV diuresis. Watch renal function closely post cath.  - Will need to increase torsemide to 60 daily  post cath - Continue Entresto 97/103 mg BID. (will hold for 1-2 days pos cath) - Continue Coreg 12.5 mg BID may be able to increase at next visit.  - Continue isosorbide 90 mg daily.  - Continue hydralazine 50 mg TID.  - Continue spiro 25mg  daily  - Will eventually need ICD if EF remains < 35% post PCI in 3 months  - Continue Jardiance.   2. CAD - s/p PCI/DES of RCA today. Other CAD stable. Manage medically  - Continue isosorbide and Ranexa. - Continue DAPT and statin.   3. ETOH abuse - Congratulated him on cessation   4. Tobacco abuse - Chews tobacco.  - Encouraged complete cessation.   5. HTN - Blood pressure high today. Tittrate as needed. Says it is ewell controlled at home   6. CKD stage III - Baseline creatinine 1.7-2.2.  - Follows with Dr. Hinda Lenis - Cr 1.7 pre-cath. Watch closely post cath  7. DM - Per PCP. - Continue Jardiance.  - Cover with SSI  8. Fatigue - pending sleep soon. Suspect he has severe OSA with pulmonary HTN    Glori Bickers, MD 03/19/18

## 2018-03-19 NOTE — Care Management (Signed)
#  5.   Brad Mendoza     [facility]  @ [employer] # [phone]   BRILINTA  90 MG BID  COVER- YES CO-PAY- $ 47.00 TIER- 3 DRUG PRIOR APPROVAL- NO  DEDUCTIBLE : NOT MET  PREFERRED PHARMACY : YES   - [facility]

## 2018-03-19 NOTE — Interval H&P Note (Signed)
History and Physical Interval Note:  03/19/2018 10:54 AM  Brad Mendoza  has presented today for surgery, with the diagnosis of hf  The various methods of treatment have been discussed with the patient and family. After consideration of risks, benefits and other options for treatment, the patient has consented to  Procedure(s): RIGHT/LEFT HEART CATH AND CORONARY ANGIOGRAPHY (N/A) and possible coronary angioplasty as a surgical intervention .  The patient's history has been reviewed, patient examined, no change in status, stable for surgery.  I have reviewed the patient's chart and labs.  Questions were answered to the patient's satisfaction.     [name]

## 2018-03-19 NOTE — Progress Notes (Signed)
TR BAND REMOVAL  LOCATION:    right radial  DEFLATED PER PROTOCOL:    Yes.    TIME BAND OFF / DRESSING APPLIED:    1800   SITE UPON ARRIVAL:    Level 0  SITE AFTER BAND REMOVAL:    Level 0  CIRCULATION SENSATION AND MOVEMENT:    Within Normal Limits   Yes.    COMMENTS:   TOLERATED PROCEDURE WELL

## 2018-03-20 ENCOUNTER — Encounter (INDEPENDENT_AMBULATORY_CARE_PROVIDER_SITE_OTHER): Payer: Medicare Other | Admitting: Ophthalmology

## 2018-03-20 ENCOUNTER — Encounter (HOSPITAL_COMMUNITY): Payer: Self-pay | Admitting: Internal Medicine

## 2018-03-20 LAB — GLUCOSE, CAPILLARY
GLUCOSE-CAPILLARY: 234 mg/dL — AB (ref 70–99)
Glucose-Capillary: 116 mg/dL — ABNORMAL HIGH (ref 70–99)
Glucose-Capillary: 202 mg/dL — ABNORMAL HIGH (ref 70–99)
Glucose-Capillary: 143 mg/dL — ABNORMAL HIGH (ref 70–99)

## 2018-03-20 LAB — CBC
HCT: 27.8 % — ABNORMAL LOW (ref 39.0–52.0)
Hemoglobin: 9 g/dL — ABNORMAL LOW (ref 13.0–17.0)
MCH: 26.9 pg (ref 26.0–34.0)
MCHC: 32.4 g/dL (ref 30.0–36.0)
MCV: 83 fL (ref 80.0–100.0)
Platelets: 183 10*3/uL (ref 150–400)
RBC: 3.35 MIL/uL — ABNORMAL LOW (ref 4.22–5.81)
RDW: 13.8 % (ref 11.5–15.5)
WBC: 7.3 10*3/uL (ref 4.0–10.5)
nRBC: 0 % (ref 0.0–0.2)

## 2018-03-20 LAB — BASIC METABOLIC PANEL
Anion gap: 7 (ref 5–15)
BUN: 32 mg/dL — ABNORMAL HIGH (ref 8–23)
CHLORIDE: 108 mmol/L (ref 98–111)
CO2: 23 mmol/L (ref 22–32)
Calcium: 8.2 mg/dL — ABNORMAL LOW (ref 8.9–10.3)
Creatinine, Ser: 2.02 mg/dL — ABNORMAL HIGH (ref 0.61–1.24)
GFR calc Af Amer: 40 mL/min — ABNORMAL LOW (ref >60)
GFR calc non Af Amer: 34 mL/min — ABNORMAL LOW (ref >60)
Glucose, Bld: 127 mg/dL — ABNORMAL HIGH (ref 70–99)
Potassium: 4.1 mmol/L (ref 3.5–5.1)
Sodium: 138 mmol/L (ref 135–145)

## 2018-03-20 LAB — HIV ANTIBODY (ROUTINE TESTING W REFLEX): HIV Screen 4th Generation wRfx: NONREACTIVE

## 2018-03-20 MED ORDER — INFLUENZA VAC SPLIT QUAD 0.5 ML IM SUSY
0.5000 mL | PREFILLED_SYRINGE | Freq: Once | INTRAMUSCULAR | Status: AC
  Administered 2018-03-21: 0.5 mL via INTRAMUSCULAR
  Filled 2018-03-20: qty 0.5

## 2018-03-20 MED ORDER — FUROSEMIDE 10 MG/ML IJ SOLN
80.0000 mg | Freq: Once | INTRAMUSCULAR | Status: AC
  Administered 2018-03-20: 80 mg via INTRAVENOUS
  Filled 2018-03-20: qty 8

## 2018-03-20 MED FILL — Midazolam HCl Inj 2 MG/2ML (Base Equivalent): INTRAMUSCULAR | Qty: 2 | Status: AC

## 2018-03-20 MED FILL — Fentanyl Citrate Preservative Free (PF) Inj 100 MCG/2ML: INTRAMUSCULAR | Qty: 2 | Status: AC

## 2018-03-20 NOTE — Progress Notes (Addendum)
Advanced Heart Failure Rounding Note  PCP-Cardiologist: No primary care provider on file.   Subjective:    Admitted from cath lab yesterday with volume overload as below. Cath showed 90% RCA stenosis (worsened) and underwent PCI/DES to RCA by Dr. Ellyn Hack.   Cr 1.7 - > 2.02. Marland KitchenReceived 75 cc NS x 6 hrs. Weight shows down 5 lbs with 80 mg IV lasix x 1.   He remains orthopneic. Denies CP. R arm cath site stable. Urine slightly darker this am.   [facility] 03/19/2018  Ost 1st Diag lesion is 95% stenosed.  Prox LAD to Mid LAD lesion is 60% stenosed. (stable)  2nd Diag lesion is 70% stenosed.  Mid Cx to Dist Cx lesion is 30% stenosed.  Mid RCA lesion is 70% stenosed.  Prox RCA lesion is 90% stenosed. (worse from previous)  Findings:  Ao = 153/81 (111) LV = 144/30 RA = 11 RV = 77/12 PA = 76/30 (50) PCW = 29 Fick cardiac output/index = 6.8/3.1 PVR = 3.0 WU Ao sat = 95% PA sat = 62%, 65%  Assessment: 1. 2v CAD with high-grade lesion in proximal RCA and first diagonal 2. The RCA lesion has progressed from previous.  3. There is stable moderate CAD in LAD 4. EF 30-35% by echo with elevated filling pressures 5. Moderate mixed pulmonary HTN   Objective:   Weight Range: 104.6 kg Body mass index is 34.05 kg/m.   Vital Signs:   Temp:  [97.7 F (36.5 C)-98.5 F (36.9 C)] 97.9 F (36.6 C) (01/28 0717) Pulse Rate:  [72-86] 78 (01/28 0717) Resp:  [11-32] 27 (01/28 0717) BP: (133-185)/(77-115) 136/77 (01/28 0717) SpO2:  [96 %-100 %] 100 % (01/28 0717) Weight:  [104.6 kg] 104.6 kg (01/28 0620) Last BM Date: 03/18/18  Weight change: Filed Weights   03/19/18 0840 03/20/18 0620  Weight: 106.6 kg 104.6 kg    Intake/Output:   Intake/Output Summary (Last 24 hours) at 03/20/2018 0848 Last data filed at 03/20/2018 0137 Gross per 24 hour  Intake 1220 ml  Output 1250 ml  Net -30 ml      Physical Exam    General:  Well appearing. No resp difficulty HEENT:  Normal Neck: Supple. JVP . Carotids 2+ bilat; no bruits. No lymphadenopathy or thyromegaly appreciated. Cor: PMI nondisplaced. Regular rate & rhythm. No rubs, gallops or murmurs. Lungs: Clear Abdomen: Soft, nontender, nondistended. No hepatosplenomegaly. No bruits or masses. Good bowel sounds. Extremities: No cyanosis, clubbing, rash, edema Neuro: Alert & orientedx3, cranial nerves grossly intact. moves all 4 extremities w/o difficulty. Affect pleasant   Telemetry   NSR 80-90s, personally reviewed.   EKG    03/19/2018 NSR 86 bpm, personally reviewed  Labs    CBC Recent Labs    03/19/18 1443 03/20/18 0435  WBC 6.6 7.3  HGB 9.9* 9.0*  HCT 32.4* 27.8*  MCV 85.0 83.0  PLT 218 741   Basic Metabolic Panel Recent Labs    03/19/18 1220 03/19/18 1443 03/20/18 0435  NA 145  143  --  138  K 3.9  4.2  --  4.1  CL  --   --  108  CO2  --   --  23  GLUCOSE  --   --  127*  BUN  --   --  32*  CREATININE  --  1.83* 2.02*  CALCIUM  --   --  8.2*   Liver Function Tests No results for input(s): AST, ALT, ALKPHOS, BILITOT, PROT, ALBUMIN in  the last 72 hours. No results for input(s): LIPASE, AMYLASE in the last 72 hours. Cardiac Enzymes No results for input(s): CKTOTAL, CKMB, CKMBINDEX, TROPONINI in the last 72 hours.  BNP: BNP (last 3 results) Recent Labs    12/08/17 1110  BNP 239.2*    ProBNP (last 3 results) No results for input(s): PROBNP in the last 8760 hours.   D-Dimer No results for input(s): DDIMER in the last 72 hours. Hemoglobin A1C Recent Labs    03/19/18 1716  HGBA1C 11.7*   Fasting Lipid Panel No results for input(s): CHOL, HDL, LDLCALC, TRIG, CHOLHDL, LDLDIRECT in the last 72 hours. Thyroid Function Tests No results for input(s): TSH, T4TOTAL, T3FREE, THYROIDAB in the last 72 hours.  Invalid input(s): FREET3  Other results:   Imaging     No results found.   Medications:     Scheduled Medications: . aspirin  81 mg Oral Daily  .  carvedilol  12.5 mg Oral BID WC  . enoxaparin (LOVENOX) injection  40 mg Subcutaneous Q24H  . hydrALAZINE  50 mg Oral TID  . insulin aspart  0-15 Units Subcutaneous TID WC  . isosorbide mononitrate  90 mg Oral Daily  . pantoprazole  40 mg Oral Daily  . ranolazine  500 mg Oral BID  . rosuvastatin  10 mg Oral Daily  . sodium chloride flush  3 mL Intravenous Q12H  . sodium chloride flush  3 mL Intravenous Q12H  . spironolactone  12.5 mg Oral Daily  . ticagrelor  90 mg Oral BID     Infusions: . sodium chloride    . sodium chloride       PRN Medications:  sodium chloride, sodium chloride, acetaminophen, albuterol, nitroGLYCERIN, ondansetron (ZOFRAN) IV, sodium chloride flush, sodium chloride flush    Patient Profile   Brad Mendoza is a 63 y.o. male with a past medical history of systolic HF due to NICM, HTN, HLD, DM and CAD.   Admitted from cath lab yesterday with volume overload as below. Cath showed 90% RCA stenosis (worsened) and underwent PCI/DES to RCA by Dr. Ellyn Hack.   Assessment/Plan   1. Acute on chronic systolic CHF: Mixed ICM and NICM. He had preexisting NICM diagnosed in 2005 when his EF was 35% and had normal cors. Echo in 05/2016 with EF 30-35% and cath showed 70-75% stenosis in the proximal and mid LAD,90% ostial stenosis in the 1st diagonal, and 60-70% stenosis in the mid RCA. [place] 05/2016-  Fick C.O. 5.86/index 2.74 - Echo from 4/19 reviewed personally EF read as 40-45% but it is more like 25% - Echo 03/12/17 EF 30-35% - Volume status remains elevated - Repeat IV lasix 80 mg at least this am.  - Holding Entresto 97/103 mg BID for 1-2 days pos cath and watching kidney function.  - Continue Coreg 12.5 mg BID  - Continue isosorbide 90 mg daily.  - Continue hydralazine 50 mg TID.  - Continue spiro 12.5 mg daily  - Will eventually need ICD if EF remains < 35% post PCI in 3 months  - Continue Jardiance.   2. CAD - s/p PCI/DES of RCA 03/19/2018. Other CAD stable.  Manage medically  - No chest pain.  - Continue isosorbide and Ranexa. - Continue DAPT and statin.   3. ETOH abuse - Congratulated him on cessation.   4. Tobacco abuse - Chews tobacco.  - Encouraged complete cessation.   5. HTN - Meds as above in setting of CHF.   6. CKD stage  III - Baseline creatinine 1.7-2.2.  - Follows with Dr. Hinda Lenis - Cr 2.0 this am. Follow closely post cath and with diuresis.   7. DM - Per PCP. - He has been off Jardiance. Would like to restart as outpatient.  - Cover with SSI.   8. Fatigue - Pending sleep study 03/28/2018. Suspect he has severe OSA with pulmonary HTN   Remains volume overload. Repeat IV lasix at least this am with continued orthopnea. Watch renal function closely.   Medication concerns reviewed with patient and pharmacy team. Barriers identified: None at this time.   Length of Stay: 1  Annamaria Helling  03/20/2018, 8:48 AM  Advanced Heart Failure Team Pager [phone] (M-F; 7a - 4p)  Please contact [facility] for night-coverage after hours (4p -7a ) and weekends on [email or web address]  Patient seen and examined with the above-signed Advanced Practice Provider and/or Housestaff. I personally reviewed laboratory data, imaging studies and relevant notes. I independently examined the patient and formulated the important aspects of the plan. I have edited the note to reflect any of my changes or salient points. I have personally discussed the plan with the patient and/or family.  S/p PCI of RCA yesterday. Denies CP. However reports increasing dyspnea overnight and hard time sleeping. Bedside RM has raised the possibility of adenosine response to Brilinta but I am more concerned about volume overload based on his RHC yesterday (PCWP 30). Will cotninue to diurese. Creatinine up slightly - need to watch closely for contrast nephropathy.  Glori Bickers, MD  1:38 PM

## 2018-03-20 NOTE — Care Management Note (Addendum)
Case Management Note  Patient Details  Name: Brad Mendoza MRN: [id number] Date of Birth: [birth year]  Subjective/Objective:   From home alone, s/p stent intervention will be on brilinta, NCM spoke with patient gave him the copay information for the Brilinta and the preferred pharmacy information.  He states he will need HHRN for CHF disease management and he will be going to stay at his wife [name])  address at [address], [place] [address], phone is [phone].  NCM offered choice from Medicare.gov list , he chose [facility], referral made to [name] with [facility] for [facility] for CHF disease management, soc will begin 24-48 hrs post dc.                  Action/Plan: DC home when ready.  Expected Discharge Date:                  Expected Discharge Plan:  [place]  In-House Referral:     Discharge planning Services  CM Consult, Medication Assistance  Post Acute Care Choice:  Home Health Choice offered to:  Patient  DME Arranged:    DME Agency:     HH Arranged:  RN [place] Agency:  [place]  Status of Service:  Completed, signed off  If discussed at [place] of Stay Meetings, dates discussed:    Additional Comments:  Zenon Mayo, RN 03/20/2018, 9:25 AM

## 2018-03-20 NOTE — Progress Notes (Signed)
CARDIAC REHAB PHASE I   PRE:  Rate/Rhythm: 78 SR  BP:  Supine: 129/75  Sitting:   Standing:    SaO2: 97%RA  MODE:  Ambulation: 400 ft   POST:  Rate/Rhythm: 86 SR  BP:  Supine:   Sitting: 140/77  Standing:    SaO2: 93%RA 1300-1415 Pt walked 400 ft on RA with steady gait. Had to stop many times due to SOB. Pt stated his breathing was better than it had been.  Sats good on RA upon returning to room. Gave pt CHF booklet and discussed when to call MD with weight gain and signs/symptoms.  Pt stated he needs to get a battery for his scale. Gave low sodium diets and heart healthy. Discussed 2000 mg. Looked up for pt ramen noodles sodium as he eats two at a time sometimes. Discussed importance of brilinta with stent. Reviewed trying to get A1C down as pt stated he has been going to MD and trying to lower it. Discussed tobacco cessation with snuff use. Pt stated he is going to quit.  Will continue ed tomorrow.   Graylon Good, RN BSN  03/20/2018 2:09 PM

## 2018-03-21 LAB — BASIC METABOLIC PANEL
Anion gap: 11 (ref 5–15)
BUN: 33 mg/dL — ABNORMAL HIGH (ref 8–23)
CALCIUM: 8.2 mg/dL — AB (ref 8.9–10.3)
CO2: 20 mmol/L — ABNORMAL LOW (ref 22–32)
Chloride: 106 mmol/L (ref 98–111)
Creatinine, Ser: 2.22 mg/dL — ABNORMAL HIGH (ref 0.61–1.24)
GFR calc Af Amer: 35 mL/min — ABNORMAL LOW (ref >60)
GFR calc non Af Amer: 31 mL/min — ABNORMAL LOW (ref >60)
Glucose, Bld: 92 mg/dL (ref 70–99)
Potassium: 3.9 mmol/L (ref 3.5–5.1)
Sodium: 137 mmol/L (ref 135–145)

## 2018-03-21 LAB — GLUCOSE, CAPILLARY
Glucose-Capillary: 86 mg/dL (ref 70–99)
Glucose-Capillary: 174 mg/dL — ABNORMAL HIGH (ref 70–99)
Glucose-Capillary: 152 mg/dL — ABNORMAL HIGH (ref 70–99)
Glucose-Capillary: 346 mg/dL — ABNORMAL HIGH (ref 70–99)

## 2018-03-21 MED ORDER — FUROSEMIDE 10 MG/ML IJ SOLN
80.0000 mg | Freq: Once | INTRAMUSCULAR | Status: AC
  Administered 2018-03-21: 80 mg via INTRAVENOUS
  Filled 2018-03-21: qty 8

## 2018-03-21 NOTE — Progress Notes (Signed)
CARDIAC REHAB PHASE I   PRE:  Rate/Rhythm: 79 SR  BP:  Supine:   Sitting: 142/82  Standing:    SaO2: 95%RA  MODE:  Ambulation: 500 ft   POST:  Rate/Rhythm: 85 SR  BP:  Supine:   Sitting: 140/83  Standing:    SaO2: 91-92%RA 1015-1052 Pt walked 500 ft on RA stopping several times to look out window. No complaints. Some SOB noted when he walks much distance. Education completed re walking for ex, NTG use and CRP 2. Referred to [place] program.  Gave pt a pack of sugarless gum to help craving since giving up snuff.   Graylon Good, RN BSN  03/21/2018 10:46 AM

## 2018-03-21 NOTE — Progress Notes (Addendum)
Advanced Heart Failure Rounding Note  PCP-Cardiologist: No primary care provider on file.   Subjective:    Admitted from cath lab yesterday with volume overload as below. Cath showed 90% RCA stenosis (worsened) and underwent PCI/DES to RCA by Dr. Ellyn Hack.   Yesterday diuresed with IV lasix. Weight down 1 pound. Creatinine up again slightly 1.8 -> 2.0 -> 2.2  Denies SOB/ chest pain.   [facility] 03/19/2018  Ost 1st Diag lesion is 95% stenosed.  Prox LAD to Mid LAD lesion is 60% stenosed. (stable)  2nd Diag lesion is 70% stenosed.  Mid Cx to Dist Cx lesion is 30% stenosed.  Mid RCA lesion is 70% stenosed.  Prox RCA lesion is 90% stenosed. (worse from previous)  Findings:  Ao = 153/81 (111) LV = 144/30 RA = 11 RV = 77/12 PA = 76/30 (50) PCW = 29 Fick cardiac output/index = 6.8/3.1 PVR = 3.0 WU Ao sat = 95% PA sat = 62%, 65%  Assessment: 1. 2v CAD with high-grade lesion in proximal RCA and first diagonal 2. The RCA lesion has progressed from previous.  3. There is stable moderate CAD in LAD 4. EF 30-35% by echo with elevated filling pressures 5. Moderate mixed pulmonary HTN   Objective:   Weight Range: 101.9 kg Body mass index is 33.18 kg/m.   Vital Signs:   Temp:  [98.1 F (36.7 C)-98.5 F (36.9 C)] 98.1 F (36.7 C) (01/29 0305) Pulse Rate:  [75-83] 75 (01/29 0305) Resp:  [18-22] 20 (01/29 0305) BP: (133-143)/(72-92) 142/92 (01/29 0305) SpO2:  [97 %-100 %] 100 % (01/29 0305) Weight:  [101.9 kg-102.4 kg] 101.9 kg (01/29 0305) Last BM Date: 03/20/18  Weight change: Filed Weights   03/20/18 0620 03/20/18 2041 03/21/18 0305  Weight: 104.6 kg 102.4 kg 101.9 kg    Intake/Output:   Intake/Output Summary (Last 24 hours) at 03/21/2018 0837 Last data filed at 03/21/2018 0749 Gross per 24 hour  Intake 1560 ml  Output 2325 ml  Net -765 ml      Physical Exam    General:  No resp difficulty. In bed.  HEENT: Normal Neck: Supple. JVP hard to assess  due to body habitus. Carotids 2+ bilat; no bruits. No lymphadenopathy or thyromegaly appreciated. Cor: PMI nondisplaced. Regular rate & rhythm. No rubs, gallops or murmurs. Lungs: Clear Abdomen: Soft, nontender, nondistended. No hepatosplenomegaly. No bruits or masses. Good bowel sounds. Extremities: No cyanosis, clubbing, rash, R and LLE 1+ edema Neuro: Alert & orientedx3, cranial nerves grossly intact. moves all 4 extremities w/o difficulty. Affect pleasant   Telemetry   NSR 70-80s  Personally reviewed   EKG    03/19/2018 NSR 86 bpm, personally reviewed  Labs    CBC Recent Labs    03/19/18 1443 03/20/18 0435  WBC 6.6 7.3  HGB 9.9* 9.0*  HCT 32.4* 27.8*  MCV 85.0 83.0  PLT 218 124   Basic Metabolic Panel Recent Labs    03/19/18 1220 03/19/18 1443 03/20/18 0435  NA 145  143  --  138  K 3.9  4.2  --  4.1  CL  --   --  108  CO2  --   --  23  GLUCOSE  --   --  127*  BUN  --   --  32*  CREATININE  --  1.83* 2.02*  CALCIUM  --   --  8.2*   Liver Function Tests No results for input(s): AST, ALT, ALKPHOS, BILITOT, PROT, ALBUMIN in the  last 72 hours. No results for input(s): LIPASE, AMYLASE in the last 72 hours. Cardiac Enzymes No results for input(s): CKTOTAL, CKMB, CKMBINDEX, TROPONINI in the last 72 hours.  BNP: BNP (last 3 results) Recent Labs    12/08/17 1110  BNP 239.2*    ProBNP (last 3 results) No results for input(s): PROBNP in the last 8760 hours.   D-Dimer No results for input(s): DDIMER in the last 72 hours. Hemoglobin A1C Recent Labs    03/19/18 1716  HGBA1C 11.7*   Fasting Lipid Panel No results for input(s): CHOL, HDL, LDLCALC, TRIG, CHOLHDL, LDLDIRECT in the last 72 hours. Thyroid Function Tests No results for input(s): TSH, T4TOTAL, T3FREE, THYROIDAB in the last 72 hours.  Invalid input(s): FREET3  Other results:   Imaging    No results found.   Medications:     Scheduled Medications: . aspirin  81 mg Oral Daily  .  carvedilol  12.5 mg Oral BID WC  . enoxaparin (LOVENOX) injection  40 mg Subcutaneous Q24H  . hydrALAZINE  50 mg Oral TID  . Influenza vac split quadrivalent PF  0.5 mL Intramuscular Once  . insulin aspart  0-15 Units Subcutaneous TID WC  . isosorbide mononitrate  90 mg Oral Daily  . pantoprazole  40 mg Oral Daily  . ranolazine  500 mg Oral BID  . rosuvastatin  10 mg Oral Daily  . sodium chloride flush  3 mL Intravenous Q12H  . sodium chloride flush  3 mL Intravenous Q12H  . spironolactone  12.5 mg Oral Daily  . ticagrelor  90 mg Oral BID    Infusions: . sodium chloride    . sodium chloride      PRN Medications: sodium chloride, sodium chloride, acetaminophen, albuterol, nitroGLYCERIN, ondansetron (ZOFRAN) IV, sodium chloride flush, sodium chloride flush    Patient Profile   Brad Mendoza is a 63 y.o. male with a past medical history of systolic HF due to NICM, HTN, HLD, DM and CAD.   Admitted from cath lab yesterday with volume overload as below. Cath showed 90% RCA stenosis (worsened) and underwent PCI/DES to RCA by Dr. Ellyn Hack.   Assessment/Plan   1. Acute on chronic systolic CHF: Mixed ICM and NICM. He had preexisting NICM diagnosed in 2005 when his EF was 35% and had normal cors. Echo in 05/2016 with EF 30-35% and cath showed 70-75% stenosis in the proximal and mid LAD,90% ostial stenosis in the 1st diagonal, and 60-70% stenosis in the mid RCA. [place] 05/2016-  Fick C.O. 5.86/index 2.74 - Echo from 4/19 reviewed personally EF read as 40-45% but it is more like 25% - Echo 03/12/17 EF 30-35% - Volume elevated. Continue IV lasix today. Creatinine continues to climb a bit. Need to watch closely. 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  - Continue Coreg 12.5 mg BID  - Continue isosorbide 90 mg daily.  - Continue hydralazine 50 mg TID.  - Continue spiro 12.5 mg daily  - Will eventually need ICD if EF remains  < 35% post PCI in 3 months  - Continue Jardiance.  - Add ted hose.   2. CAD - s/p PCI/DES of RCA 03/19/2018. Other CAD stable. Manage medically  -No chest pain. .  - Continue isosorbide and Ranexa. - Continue DAPT and statin.   3. ETOH abuse - Congratulated him on cessation.   4. Tobacco abuse - Chews tobacco.  - Encouraged complete cessation.  5. HTN - Meds as above in setting of CHF.   6. CKD stage III - Baseline creatinine 1.7-2.2.  - Follows with Dr. Hinda Lenis -Creatinine stable 2.02.    7. DM - Per PCP. - He has been off Jardiance. Would like to restart as outpatient.  - Cover with SSI.   8. Fatigue - Pending sleep study 03/28/2018. Suspect he has severe OSA with pulmonary HTN    Length of Stay: 2  Darrick Grinder, NP  03/21/2018, 8:37 AM  Advanced Heart Failure Team Pager [phone] (M-F; 7a - 4p)  Please contact [facility] for night-coverage after hours (4p -7a ) and weekends on [email or web address]  Patient seen and examined with the above-signed Advanced Practice Provider and/or Housestaff. I personally reviewed laboratory data, imaging studies and relevant notes. I independently examined the patient and formulated the important aspects of the plan. I have edited the note to reflect any of my changes or salient points. I have personally discussed the plan with the patient and/or family.  Breathing better today but volume still elevated. Remains on IV lasix. Creatinine continues to climb a bit. Will need to watch closely. No angina. Continue DAPT.   Glori Bickers, MD  12:52 PM

## 2018-03-21 NOTE — Progress Notes (Signed)
Inpatient Diabetes Program Recommendations  AACE/ADA: New Consensus Statement on Inpatient Glycemic Control (2015)  Target Ranges:  Prepandial:   less than 140 mg/dL      Peak postprandial:   less than 180 mg/dL (1-2 hours)      Critically ill patients:  140 - 180 mg/dL   Lab Results  Component Value Date   GLUCAP 174 (H) 03/21/2018   HGBA1C 11.7 (H) 03/19/2018    Review of Glycemic Control Results for Brad Mendoza, Brad Mendoza (MRN [id number]) as of 03/21/2018 13:55  Ref. Range 03/20/2018 16:56 03/20/2018 22:03 03/21/2018 07:47 03/21/2018 11:53  Glucose-Capillary Latest Ref Range: 70 - 99 mg/dL 202 (H) 143 (H) 86 174 (H)   Diabetes history: DM 2 Outpatient Diabetes medications:  Jardiance 10 mg daily (patient not taking) Toujeo 40 units q AM and 15 units q PM Current orders for Inpatient glycemic control:  Novolog moderate tid with meals   Inpatient Diabetes Program Recommendations:    Spoke with patient regarding A1C.  He states he had run out of insulin for a while however he did start taking it again in December of 2019. Patient states that when he checks blood sugars in the morning they usually run 107 and 78 mg/dL.  He does not think he is having low blood sugars at home.  He see's Dr. Evette Doffing as PCP but states that he received a letter stating that she will be leaving the practice in March. Patient states that he is feeling better and that blood sugars are better are home. He has no questions at this time. Reminded him of importance of glycemic control and diabetes management.   -Fasting CBG=86 mg/dL, therefore do not recommend starting patient's basal insulin at this time.   -Upon d/c home, patient may need reduced dose of Toujeo in order to prevent hypoglycemia.   -Patient needs to f/u with PCP ASAP regarding diabetes and glycemic control.   Thanks,  Adah Perl, RN, BC-ADM Inpatient Diabetes Coordinator Pager [phone] (8a-5p)

## 2018-03-22 LAB — BASIC METABOLIC PANEL
Anion gap: 9 (ref 5–15)
BUN: 39 mg/dL — ABNORMAL HIGH (ref 8–23)
CO2: 21 mmol/L — ABNORMAL LOW (ref 22–32)
CREATININE: 2.36 mg/dL — AB (ref 0.61–1.24)
Calcium: 8.1 mg/dL — ABNORMAL LOW (ref 8.9–10.3)
Chloride: 106 mmol/L (ref 98–111)
GFR calc Af Amer: 33 mL/min — ABNORMAL LOW (ref >60)
GFR calc non Af Amer: 28 mL/min — ABNORMAL LOW (ref >60)
Glucose, Bld: 300 mg/dL — ABNORMAL HIGH (ref 70–99)
Potassium: 4.3 mmol/L (ref 3.5–5.1)
Sodium: 136 mmol/L (ref 135–145)

## 2018-03-22 LAB — GLUCOSE, CAPILLARY
Glucose-Capillary: 298 mg/dL — ABNORMAL HIGH (ref 70–99)
Glucose-Capillary: 212 mg/dL — ABNORMAL HIGH (ref 70–99)
Glucose-Capillary: 143 mg/dL — ABNORMAL HIGH (ref 70–99)
Glucose-Capillary: 180 mg/dL — ABNORMAL HIGH (ref 70–99)
Glucose-Capillary: 150 mg/dL — ABNORMAL HIGH (ref 70–99)

## 2018-03-22 LAB — TROPONIN I
TROPONIN I: 0.07 ng/mL — AB (ref <0.03)
Troponin I: 0.07 ng/mL (ref <0.03)

## 2018-03-22 MED ORDER — ISOSORBIDE MONONITRATE ER 60 MG PO TB24
120.0000 mg | ORAL_TABLET | Freq: Every day | ORAL | Status: DC
  Administered 2018-03-22 – 2018-03-27 (×6): 120 mg via ORAL
  Filled 2018-03-22 (×6): qty 2

## 2018-03-22 MED ORDER — FUROSEMIDE 10 MG/ML IJ SOLN
80.0000 mg | Freq: Two times a day (BID) | INTRAMUSCULAR | Status: DC
  Administered 2018-03-22 – 2018-03-24 (×5): 80 mg via INTRAVENOUS
  Filled 2018-03-22 (×5): qty 8

## 2018-03-22 MED ORDER — ALUM & MAG HYDROXIDE-SIMETH 200-200-20 MG/5ML PO SUSP
30.0000 mL | ORAL | Status: DC | PRN
  Administered 2018-03-22: 30 mL via ORAL
  Filled 2018-03-22: qty 30

## 2018-03-22 MED ORDER — MORPHINE SULFATE (PF) 2 MG/ML IV SOLN
2.0000 mg | INTRAVENOUS | Status: DC | PRN
  Administered 2018-03-22 (×2): 2 mg via INTRAVENOUS
  Filled 2018-03-22 (×2): qty 1

## 2018-03-22 MED ORDER — MILRINONE LACTATE IN DEXTROSE 20-5 MG/100ML-% IV SOLN
0.1250 ug/kg/min | INTRAVENOUS | Status: DC
  Administered 2018-03-22 – 2018-03-26 (×5): 0.125 ug/kg/min via INTRAVENOUS
  Filled 2018-03-22 (×5): qty 100

## 2018-03-22 NOTE — Care Management Important Message (Signed)
Important Message  Patient Details  Name: Brad Mendoza MRN: [id number] Date of Birth: February 24, 1955   Medicare Important Message Given:  Yes    Jacquelene Kopecky 03/22/2018, 4:10 PM

## 2018-03-22 NOTE — Progress Notes (Signed)
ReDS Vest - 03/22/18 1355      ReDS Vest   Estimated volume prior to reading  High    Fitting Posture  Sitting    Height Marker  Tall    Ruler Value  35    Center Strip  Aligned    ReDS Value  45

## 2018-03-22 NOTE — Progress Notes (Addendum)
Advanced Heart Failure Rounding Note  PCP-Cardiologist: No primary care provider on file.   Subjective:    Admitted from cath lab yesterday with volume overload as below. Cath showed 90% RCA stenosis (worsened) and underwent PCI/DES to RCA by Dr. Ellyn Hack.   Having recurrent chest pain overnight and this morning. Radiating to his back. Given SL nitro. SOB at rest. Troponin 0.07.    [facility] 03/19/2018  Ost 1st Diag lesion is 95% stenosed.  Prox LAD to Mid LAD lesion is 60% stenosed. (stable)  2nd Diag lesion is 70% stenosed.  Mid Cx to Dist Cx lesion is 30% stenosed.  Mid RCA lesion is 70% stenosed.  Prox RCA lesion is 90% stenosed. (worse from previous)  Findings:  Ao = 153/81 (111) LV = 144/30 RA = 11 RV = 77/12 PA = 76/30 (50) PCW = 29 Fick cardiac output/index = 6.8/3.1 PVR = 3.0 WU Ao sat = 95% PA sat = 62%, 65%  Assessment: 1. 2v CAD with high-grade lesion in proximal RCA and first diagonal 2. The RCA lesion has progressed from previous.  3. There is stable moderate CAD in LAD 4. EF 30-35% by echo with elevated filling pressures 5. Moderate mixed pulmonary HTN   Objective:   Weight Range: 102.2 kg Body mass index is 33.27 kg/m.   Vital Signs:   Temp:  [98 F (36.7 C)-98.9 F (37.2 C)] 98.5 F (36.9 C) (01/30 0338) Pulse Rate:  [74-83] 81 (01/30 0338) Resp:  [22-25] 25 (01/30 0350) BP: (141-165)/(80-94) 144/86 (01/30 0836) SpO2:  [96 %-99 %] 99 % (01/30 0338) Weight:  [102.2 kg] 102.2 kg (01/30 0350) Last BM Date: 03/20/18  Weight change: Filed Weights   03/20/18 2041 03/21/18 0305 03/22/18 0350  Weight: 102.4 kg 101.9 kg 102.2 kg    Intake/Output:   Intake/Output Summary (Last 24 hours) at 03/22/2018 0844 Last data filed at 03/21/2018 2100 Gross per 24 hour  Intake 1644 ml  Output 1951 ml  Net -307 ml      Physical Exam    General:  No resp difficulty HEENT: normal Neck: supple. JVP to jaw.  Carotids 2+ bilat; no bruits. No  lymphadenopathy or thryomegaly appreciated. Cor: PMI nondisplaced. Regular rate & rhythm. No rubs, gallops or murmurs. Lungs: EW on 4  liters  Abdomen: soft, nontender, nondistended. No hepatosplenomegaly. No bruits or masses. Good bowel sounds. Extremities: no cyanosis, clubbing, rash,  R and LLE 2+ edema Neuro: alert & orientedx3, cranial nerves grossly intact. moves all 4 extremities w/o difficulty. Affect pleasant   Telemetry  NSR 70 bpm. No ST changes noted.    EKG    03/19/2018 NSR 86 bpm, personally reviewed  Labs    CBC Recent Labs    03/19/18 1443 03/20/18 0435  WBC 6.6 7.3  HGB 9.9* 9.0*  HCT 32.4* 27.8*  MCV 85.0 83.0  PLT 218 440   Basic Metabolic Panel Recent Labs    03/21/18 0703 03/22/18 0412  NA 137 136  K 3.9 4.3  CL 106 106  CO2 20* 21*  GLUCOSE 92 300*  BUN 33* 39*  CREATININE 2.22* 2.36*  CALCIUM 8.2* 8.1*   Liver Function Tests No results for input(s): AST, ALT, ALKPHOS, BILITOT, PROT, ALBUMIN in the last 72 hours. No results for input(s): LIPASE, AMYLASE in the last 72 hours. Cardiac Enzymes Recent Labs    03/22/18 0659  TROPONINI 0.07*    BNP: BNP (last 3 results) Recent Labs    12/08/17 1110  BNP 239.2*    ProBNP (last 3 results) No results for input(s): PROBNP in the last 8760 hours.   D-Dimer No results for input(s): DDIMER in the last 72 hours. Hemoglobin A1C Recent Labs    03/19/18 1716  HGBA1C 11.7*   Fasting Lipid Panel No results for input(s): CHOL, HDL, LDLCALC, TRIG, CHOLHDL, LDLDIRECT in the last 72 hours. Thyroid Function Tests No results for input(s): TSH, T4TOTAL, T3FREE, THYROIDAB in the last 72 hours.  Invalid input(s): FREET3  Other results:   Imaging    No results found.   Medications:     Scheduled Medications: . aspirin  81 mg Oral Daily  . carvedilol  12.5 mg Oral BID WC  . enoxaparin (LOVENOX) injection  40 mg Subcutaneous Q24H  . hydrALAZINE  50 mg Oral TID  . insulin aspart   0-15 Units Subcutaneous TID WC  . isosorbide mononitrate  90 mg Oral Daily  . pantoprazole  40 mg Oral Daily  . ranolazine  500 mg Oral BID  . rosuvastatin  10 mg Oral Daily  . sodium chloride flush  3 mL Intravenous Q12H  . sodium chloride flush  3 mL Intravenous Q12H  . spironolactone  12.5 mg Oral Daily  . ticagrelor  90 mg Oral BID    Infusions: . sodium chloride    . sodium chloride      PRN Medications: sodium chloride, sodium chloride, acetaminophen, albuterol, alum & mag hydroxide-simeth, morphine injection, nitroGLYCERIN, ondansetron (ZOFRAN) IV, sodium chloride flush, sodium chloride flush    Patient Profile   Brad Mendoza is a 63 y.o. male with a past medical history of systolic HF due to NICM, HTN, HLD, DM and CAD.   Admitted from cath lab yesterday with volume overload as below. Cath showed 90% RCA stenosis (worsened) and underwent PCI/DES to RCA by Dr. Ellyn Hack.   Assessment/Plan   1. Acute on chronic systolic CHF: Mixed ICM and NICM. He had preexisting NICM diagnosed in 2005 when his EF was 35% and had normal cors. Echo in 05/2016 with EF 30-35% and cath showed 70-75% stenosis in the proximal and mid LAD,90% ostial stenosis in the 1st diagonal, and 60-70% stenosis in the mid RCA. [place] 05/2016-  Fick C.O. 5.86/index 2.74 - Echo from 4/19 reviewed personally EF read as 40-45% but it is more like 25% - Echo 03/12/17 EF 30-35% - Volume elevated. Continue IV lasix today. Creatinine continues to climb a bit. Need to watch closely. 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  - Continue Coreg 12.5 mg BID  - Increase imdur 120 mg daily.   - Continue hydralazine 50 mg TID.  - Continue spiro 12.5 mg daily  - Will eventually need ICD if EF remains < 35% post PCI in 3 months     2. CAD - s/p PCI/DES of RCA 03/19/2018. Other CAD stable. Manage medically  Recurrent chest pain. EKG has not changed. Reviewed  with Dr Aundra Dubin.  Give IV morphine now.  Troponin at 0700 was 0.07. Obtain another troponin now. ? If  Chest pain is related to volume overload.  - Increase isosorbide as above.  - Continue Ranexa. - Continue DAPT and statin.   3. ETOH abuse - Congratulated him on cessation.   4. Tobacco abuse - Chews tobacco.  - Encouraged complete cessation.   5. HTN - Meds as above in setting of CHF.   6. CKD stage III -  Baseline creatinine 1.7-2.2.  - Follows with Dr. Hinda Lenis -Creatinine stable 2.02>2.36   - Check BMET in the morning.   7. DM - Per PCP. - He has been off Jardiance. Would like to restart as outpatient.  - Cover with SSI.   8. Fatigue - Pending sleep study 03/28/2018. Suspect he has severe OSA with pulmonary HTN    Discussed with Dr Aundra Dubin. EKG ok. Troponin 0.07> repeat now. Diurese with IV lasix.   Length of Stay: 3  Amy Clegg, NP  03/22/2018, 8:44 AM  Advanced Heart Failure Team Pager [phone] (M-F; 7a - 4p)  Please contact [facility] for night-coverage after hours (4p -7a ) and weekends on [email or web address]  Patient seen and examined with Darrick Grinder, NP. We discussed all aspects of the encounter. I agree with the assessment and plan as stated above.   He remains volume overloaded with poor response to IV lasix.REDS reading 45%. Creatinine up slightly. Had CP overnight but ECG ok and trop 0.07. suspect mainly due to HF and congestion. Will start IV milrinone to help with diuresis. Continue DAPT for recent RCA stent.   Glori Bickers, MD  4:06 PM

## 2018-03-22 NOTE — Progress Notes (Signed)
CRITICAL VALUE ALERT  Critical Value:  Trop 0.07  Date & Time Notied:  03/22/18 0840  Provider Notified: Bensimhon MD  Orders Received/Actions taken: No orders received at this time.   Chest pain 8/10.  1 nitro given  BP 144/86 EKG performed

## 2018-03-22 NOTE — Progress Notes (Signed)
Patient had to episodes of chest pain/ discomfort associated with shortness of breath during the shift. First occurrence at 339, patient stated pain in shoulders radiating to his back Nitro x1 given. EKG obtained. Patient refused serial doses and stated pain was now in rib cage and was squeezing in nature. Patient in no acute distress and ambulated to the bathroom x2 during this episode. Patient had second episode of chest pain at 613. One dose of nitro given with serial doses refused again. Stated pain was moving into abdomen. MD Paged, orders received and carried out.

## 2018-03-22 NOTE — Progress Notes (Signed)
1015 Read note that pt has been having some CP this morning. Will hold ambulation and continue to follow.Graylon Good RN BSN 03/22/2018 10:12 AM

## 2018-03-22 NOTE — Progress Notes (Signed)
Inpatient Diabetes Program Recommendations  AACE/ADA: New Consensus Statement on Inpatient Glycemic Control (2015)  Target Ranges:  Prepandial:   less than 140 mg/dL      Peak postprandial:   less than 180 mg/dL (1-2 hours)      Critically ill patients:  140 - 180 mg/dL   Lab Results  Component Value Date   GLUCAP 143 (H) 03/22/2018   HGBA1C 11.7 (H) 03/19/2018    Review of Glycemic Control Results for Brad Mendoza, Brad Mendoza (MRN [id number]) as of 03/22/2018 13:46  Ref. Range 03/21/2018 21:16 03/22/2018 03:42 03/22/2018 07:41 03/22/2018 12:10  Glucose-Capillary Latest Ref Range: 70 - 99 mg/dL 346 (H) 298 (H) 212 (H) 143 (H)  Diabetes history: DM 2 Outpatient Diabetes medications:  Jardiance 10 mg daily (patient not taking) Toujeo 40 units q AM and 15 units q PM Current orders for Inpatient glycemic control:  Novolog moderate tid with meals   Inpatient Diabetes Program Recommendations:   Consider restarting a portion of home basal insulin.  Consider Lantus 15 units q HS.   Thanks,  Adah Perl, RN, BC-ADM Inpatient Diabetes Coordinator Pager [phone] (8a-5p)

## 2018-03-23 LAB — BASIC METABOLIC PANEL
Anion gap: 10 (ref 5–15)
BUN: 41 mg/dL — ABNORMAL HIGH (ref 8–23)
CO2: 24 mmol/L (ref 22–32)
Calcium: 8.1 mg/dL — ABNORMAL LOW (ref 8.9–10.3)
Chloride: 104 mmol/L (ref 98–111)
Creatinine, Ser: 2.27 mg/dL — ABNORMAL HIGH (ref 0.61–1.24)
GFR calc Af Amer: 35 mL/min — ABNORMAL LOW (ref >60)
GFR calc non Af Amer: 30 mL/min — ABNORMAL LOW (ref >60)
GLUCOSE: 162 mg/dL — AB (ref 70–99)
Potassium: 3.8 mmol/L (ref 3.5–5.1)
Sodium: 138 mmol/L (ref 135–145)

## 2018-03-23 LAB — GLUCOSE, CAPILLARY
Glucose-Capillary: 152 mg/dL — ABNORMAL HIGH (ref 70–99)
Glucose-Capillary: 229 mg/dL — ABNORMAL HIGH (ref 70–99)
Glucose-Capillary: 216 mg/dL — ABNORMAL HIGH (ref 70–99)
Glucose-Capillary: 227 mg/dL — ABNORMAL HIGH (ref 70–99)

## 2018-03-23 LAB — MAGNESIUM: Magnesium: 2 mg/dL (ref 1.7–2.4)

## 2018-03-23 MED ORDER — HYDRALAZINE HCL 50 MG PO TABS
75.0000 mg | ORAL_TABLET | Freq: Three times a day (TID) | ORAL | Status: DC
  Administered 2018-03-23 – 2018-03-26 (×9): 75 mg via ORAL
  Filled 2018-03-23 (×9): qty 1

## 2018-03-23 MED ORDER — CARVEDILOL 6.25 MG PO TABS
6.2500 mg | ORAL_TABLET | Freq: Two times a day (BID) | ORAL | Status: DC
  Administered 2018-03-24 – 2018-03-27 (×7): 6.25 mg via ORAL
  Filled 2018-03-23 (×7): qty 1

## 2018-03-23 MED ORDER — INSULIN GLARGINE 100 UNIT/ML ~~LOC~~ SOLN
15.0000 [IU] | Freq: Every day | SUBCUTANEOUS | Status: DC
  Administered 2018-03-23 – 2018-03-26 (×4): 15 [IU] via SUBCUTANEOUS
  Filled 2018-03-23 (×4): qty 0.15

## 2018-03-23 MED ORDER — TICAGRELOR 90 MG PO TABS: 90.0000 mg | ORAL_TABLET | Freq: Two times a day (BID) | ORAL | 5 refills | Status: AC

## 2018-03-23 MED FILL — BRILINTA 90 MG TABLET: 90 | 30 days supply | Qty: 60 | Fill #0

## 2018-03-23 NOTE — Progress Notes (Signed)
Inpatient Diabetes Program Recommendations  AACE/ADA: New Consensus Statement on Inpatient Glycemic Control (2015)  Target Ranges:  Prepandial:   less than 140 mg/dL      Peak postprandial:   less than 180 mg/dL (1-2 hours)      Critically ill patients:  140 - 180 mg/dL   Lab Results  Component Value Date   GLUCAP 229 (H) 03/23/2018   HGBA1C 11.7 (H) 03/19/2018    Review of Glycemic Control Results for Brad Mendoza, Brad Mendoza (MRN [id number]) as of 03/23/2018 14:15  Ref. Range 03/22/2018 16:36 03/22/2018 21:01 03/23/2018 07:43 03/23/2018 11:48  Glucose-Capillary Latest Ref Range: 70 - 99 mg/dL 180 (H) 150 (H) 152 (H) 229 (H)  Diabetes history:DM 2 Outpatient Diabetes medications: Jardiance 10 mg daily (patient not taking) Toujeo 40 units q AM and 15 units q PM Current orders for Inpatient glycemic control: Novolog moderate tid with meals  Inpatient Diabetes Program Recommendations:   Consider restarting a portion of home basal insulin.  Consider Lantus 15 units q HS.  Thanks,  Adah Perl, RN, BC-ADM Inpatient Diabetes Coordinator Pager [phone] (8a-5p)

## 2018-03-23 NOTE — Progress Notes (Addendum)
Advanced Heart Failure Rounding Note  PCP-Cardiologist: No primary care provider on file.   Subjective:    Admitted from cath lab yesterday with volume overload as below. Cath showed 90% RCA stenosis (worsened) and underwent PCI/DES to RCA by Dr. Ellyn Hack.   Yesterday milrinone was started 0.25 mcg. Remains on IV lasix. Weight down 5 pounds.   Feeling much  better.  SOB with exertion. No orthopnea or PND. Denies chest pain.  Creatinine improving   [facility] 03/19/2018  Ost 1st Diag lesion is 95% stenosed.  Prox LAD to Mid LAD lesion is 60% stenosed. (stable)  2nd Diag lesion is 70% stenosed.  Mid Cx to Dist Cx lesion is 30% stenosed.  Mid RCA lesion is 70% stenosed.  Prox RCA lesion is 90% stenosed. (worse from previous)  Findings:  Ao = 153/81 (111) LV = 144/30 RA = 11 RV = 77/12 PA = 76/30 (50) PCW = 29 Fick cardiac output/index = 6.8/3.1 PVR = 3.0 WU Ao sat = 95% PA sat = 62%, 65%  Assessment: 1. 2v CAD with high-grade lesion in proximal RCA and first diagonal 2. The RCA lesion has progressed from previous.  3. There is stable moderate CAD in LAD 4. EF 30-35% by echo with elevated filling pressures 5. Moderate mixed pulmonary HTN   Objective:   Weight Range: 99.8 kg Body mass index is 32.49 kg/m.   Vital Signs:   Temp:  [98.2 F (36.8 C)-99 F (37.2 C)] 98.4 F (36.9 C) (01/31 0746) Pulse Rate:  [63-84] 75 (01/31 0746) BP: (123-153)/(73-85) 148/85 (01/31 0934) SpO2:  [93 %-100 %] 98 % (01/31 0934) Weight:  [99.8 kg] 99.8 kg (01/31 0525) Last BM Date: 03/22/18  Weight change: Filed Weights   03/21/18 0305 03/22/18 0350 03/23/18 0525  Weight: 101.9 kg 102.2 kg 99.8 kg    Intake/Output:   Intake/Output Summary (Last 24 hours) at 03/23/2018 1000 Last data filed at 03/23/2018 0526 Gross per 24 hour  Intake 293.59 ml  Output 2399 ml  Net -2105.41 ml      Physical Exam   General:   No resp difficulty HEENT: normal Neck: supple. JVP to  jaw . Carotids 2+ bilat; no bruits. No lymphadenopathy or thryomegaly appreciated. Cor: PMI nondisplaced. Regular rate & rhythm. No rubs, gallops or murmurs. Lungs: clear on 2 liters  Abdomen: soft, nontender, nondistended. No hepatosplenomegaly. No bruits or masses. Good bowel sounds. Extremities: no cyanosis, clubbing, rash, R and LLE 1+ edema Neuro: alert & orientedx3, cranial nerves grossly intact. moves all 4 extremities w/o difficulty. Affect pleasant  Telemetry  NSR 70s Personally reviewed    EKG    03/19/2018 NSR 86 bpm, personally reviewed  Labs    CBC No results for input(s): WBC, NEUTROABS, HGB, HCT, MCV, PLT in the last 72 hours. Basic Metabolic Panel Recent Labs    03/22/18 0412 03/23/18 0508  NA 136 138  K 4.3 3.8  CL 106 104  CO2 21* 24  GLUCOSE 300* 162*  BUN 39* 41*  CREATININE 2.36* 2.27*  CALCIUM 8.1* 8.1*  MG  --  2.0   Liver Function Tests No results for input(s): AST, ALT, ALKPHOS, BILITOT, PROT, ALBUMIN in the last 72 hours. No results for input(s): LIPASE, AMYLASE in the last 72 hours. Cardiac Enzymes Recent Labs    03/22/18 0659 03/22/18 0907  TROPONINI 0.07* 0.07*    BNP: BNP (last 3 results) Recent Labs    12/08/17 1110  BNP 239.2*    ProBNP (  last 3 results) No results for input(s): PROBNP in the last 8760 hours.   D-Dimer No results for input(s): DDIMER in the last 72 hours. Hemoglobin A1C No results for input(s): HGBA1C in the last 72 hours. Fasting Lipid Panel No results for input(s): CHOL, HDL, LDLCALC, TRIG, CHOLHDL, LDLDIRECT in the last 72 hours. Thyroid Function Tests No results for input(s): TSH, T4TOTAL, T3FREE, THYROIDAB in the last 72 hours.  Invalid input(s): FREET3  Other results:   Imaging    No results found.   Medications:     Scheduled Medications: . aspirin  81 mg Oral Daily  . carvedilol  12.5 mg Oral BID WC  . enoxaparin (LOVENOX) injection  40 mg Subcutaneous Q24H  . furosemide  80 mg  Intravenous BID  . hydrALAZINE  50 mg Oral TID  . insulin aspart  0-15 Units Subcutaneous TID WC  . isosorbide mononitrate  120 mg Oral Daily  . pantoprazole  40 mg Oral Daily  . ranolazine  500 mg Oral BID  . rosuvastatin  10 mg Oral Daily  . sodium chloride flush  3 mL Intravenous Q12H  . sodium chloride flush  3 mL Intravenous Q12H  . spironolactone  12.5 mg Oral Daily  . ticagrelor  90 mg Oral BID    Infusions: . sodium chloride    . sodium chloride    . milrinone 0.125 mcg/kg/min (03/23/18 0340)    PRN Medications: sodium chloride, sodium chloride, acetaminophen, albuterol, alum & mag hydroxide-simeth, morphine injection, nitroGLYCERIN, ondansetron (ZOFRAN) IV, sodium chloride flush, sodium chloride flush    Patient Profile   Brad Mendoza is a 63 y.o. male with a past medical history of systolic HF due to NICM, HTN, HLD, DM and CAD.   Admitted from cath lab yesterday with volume overload as below. Cath showed 90% RCA stenosis (worsened) and underwent PCI/DES to RCA by Dr. Ellyn Hack.   Assessment/Plan   1. Acute on chronic systolic CHF: Mixed ICM and NICM. He had preexisting NICM diagnosed in 2005 when his EF was 35% and had normal cors. Echo in 05/2016 with EF 30-35% and cath showed 70-75% stenosis in the proximal and mid LAD,90% ostial stenosis in the 1st diagonal, and 60-70% stenosis in the mid RCA. [place] 05/2016-  Fick C.O. 5.86/index 2.74 - Echo from 4/19 reviewed personally EF read as 40-45% but it is more like 25% - Echo 03/12/17 EF 30-35% - he feels much better on milrinone. Weight down 5 pounds. Creatinine starting to improve.  - Volume remains elevated. Continue IV lasix today. - Continue milrinone 0.25 mcg until diuresed.  - Holding Entresto 97/103 mg BID for now. .  - Decrease carvedilol to 6.25 bid  - Continue imdur 120 mg daily.   - Increase  hydralazine 75 mg TID.  - Continue spiro 12.5 mg daily  - Will eventually need ICD if EF remains < 35% post PCI in 3  months    2. CAD - s/p PCI/DES of RCA 03/19/2018. Other CAD stable. Manage medically  Recurrent chest pain. EKG has not changed. Reviewed with Dr Aundra Dubin.  Give IV morphine now.  Troponin at 0700 was 0.07.  - No chest pain.  - Continue isosorbide as above.  - Continue Ranexa. - Continue DAPT and statin.   3. ETOH abuse - Congratulated him on cessation.   4. Tobacco abuse - Chews tobacco.  - Encouraged complete cessation.   5. HTN Increase hydralazine 75 mg three times a day.  WIll eventually need  entresto back.   6. CKD stage III - Baseline creatinine 1.7-2.2.  - Follows with Dr. Hinda Lenis -Creatinine improved with milrinone 2.02>2.36 >2.2.   -BMET in am   7. DM - Per PCP. - He has been off Jardiance. Would like to restart as outpatient.  - Cover with SSI.   8. Fatigue - Pending sleep study 03/28/2018. Suspect he has severe OSA with pulmonary HTN   Continue cardiac rehab.    Length of Stay: 4  Amy Clegg, NP  03/23/2018, 10:00 AM  Advanced Heart Failure Team Pager [phone] (M-F; 7a - 4p)  Please contact [facility] for night-coverage after hours (4p -7a ) and weekends on [email or web address]   Patient seen and examined with the above-signed Advanced Practice Provider and/or Housestaff. I personally reviewed laboratory data, imaging studies and relevant notes. I independently examined the patient and formulated the important aspects of the plan. I have edited the note to reflect any of my changes or salient points. I have personally discussed the plan with the patient and/or family.  He is feeling much better with initiation of milrinone for inotropic support. Now diuresing. Weight down 5 pounds. Creatinine improved. Will continue milrinone and IV lasix. Increase hydralazine. Cut carvedilol back.   Glori Bickers, MD  7:57 PM

## 2018-03-23 NOTE — Progress Notes (Addendum)
CARDIAC REHAB PHASE I   PRE:  Rate/Rhythm: 81 SR  BP:  Supine:   Sitting: 149/79  Standing:    SaO2: 100% on 3L but out of nose  MODE:  Ambulation: 500 ft   POST:  Rate/Rhythm: 93 SR  BP:  Supine:   Sitting: 130/79  Standing:    SaO2: 93%RA 1040-1120 Pt walked 500 ft on RA with asst x 1 with steady gait. He likes to look out the window so he walks slowly and stops frequently. Slightly SOB but he stated breathing better. Left off oxygen as sat at 93% but left tubing near him in recliner in case he feels SOB. Pt was happy to walk. Singing at end of walk.   Graylon Good, RN BSN  03/23/2018 11:15 AM

## 2018-03-24 LAB — BASIC METABOLIC PANEL
Anion gap: 11 (ref 5–15)
BUN: 44 mg/dL — ABNORMAL HIGH (ref 8–23)
CALCIUM: 8.3 mg/dL — AB (ref 8.9–10.3)
CO2: 24 mmol/L (ref 22–32)
Chloride: 103 mmol/L (ref 98–111)
Creatinine, Ser: 2.28 mg/dL — ABNORMAL HIGH (ref 0.61–1.24)
GFR calc Af Amer: 34 mL/min — ABNORMAL LOW (ref >60)
GFR calc non Af Amer: 30 mL/min — ABNORMAL LOW (ref >60)
Glucose, Bld: 248 mg/dL — ABNORMAL HIGH (ref 70–99)
Potassium: 4.2 mmol/L (ref 3.5–5.1)
Sodium: 138 mmol/L (ref 135–145)

## 2018-03-24 LAB — GLUCOSE, CAPILLARY
Glucose-Capillary: 201 mg/dL — ABNORMAL HIGH (ref 70–99)
Glucose-Capillary: 158 mg/dL — ABNORMAL HIGH (ref 70–99)
Glucose-Capillary: 141 mg/dL — ABNORMAL HIGH (ref 70–99)
Glucose-Capillary: 229 mg/dL — ABNORMAL HIGH (ref 70–99)

## 2018-03-24 MED ORDER — METOLAZONE 5 MG PO TABS
5.0000 mg | ORAL_TABLET | Freq: Once | ORAL | Status: AC
  Administered 2018-03-24: 5 mg via ORAL
  Filled 2018-03-24: qty 1

## 2018-03-24 MED ORDER — FUROSEMIDE 10 MG/ML IJ SOLN
120.0000 mg | Freq: Two times a day (BID) | INTRAVENOUS | Status: DC
  Administered 2018-03-24 – 2018-03-25 (×3): 120 mg via INTRAVENOUS
  Filled 2018-03-24 (×2): qty 10
  Filled 2018-03-24: qty 12
  Filled 2018-03-24: qty 10
  Filled 2018-03-24: qty 12

## 2018-03-24 NOTE — Plan of Care (Signed)
  Problem: Education: Goal: Knowledge of General Education information will improve Description Including pain rating scale, medication(s)/side effects and non-pharmacologic comfort measures Outcome: Progressing   Problem: Education: Goal: Knowledge of General Education information will improve Description Including pain rating scale, medication(s)/side effects and non-pharmacologic comfort measures Outcome: Progressing   Problem: Activity: Goal: Ability to return to baseline activity level will improve Outcome: Progressing

## 2018-03-24 NOTE — Progress Notes (Signed)
CARDIAC REHAB PHASE I   Pt in bed when entered room. Denied walk and explained he was having some wheezing. RN was informed. Will continue to follow.   [id number]  Jeralyn Bennett BS, ACSM CEP 03/24/2018  9:55 AM

## 2018-03-24 NOTE — Progress Notes (Signed)
Advanced Heart Failure Rounding Note  PCP-Cardiologist: No primary care provider on file.   Subjective:    Admitted from cath lab yesterday with volume overload as below. Cath showed 90% RCA stenosis (worsened) and underwent PCI/DES to RCA by Dr. Ellyn Hack.   Milrinone started 0.25 mcg on 1/30. Remains on IV lasix. Weight unchanged   Feels better but had more orthopnea overnight. Able to walk halls with only mild dyspnea. Creatinine stable 2.28  [facility] 03/19/2018  Ost 1st Diag lesion is 95% stenosed.  Prox LAD to Mid LAD lesion is 60% stenosed. (stable)  2nd Diag lesion is 70% stenosed.  Mid Cx to Dist Cx lesion is 30% stenosed.  Mid RCA lesion is 70% stenosed.  Prox RCA lesion is 90% stenosed. (worse from previous)  Findings:  Ao = 153/81 (111) LV = 144/30 RA = 11 RV = 77/12 PA = 76/30 (50) PCW = 29 Fick cardiac output/index = 6.8/3.1 PVR = 3.0 WU Ao sat = 95% PA sat = 62%, 65%  Assessment: 1. 2v CAD with high-grade lesion in proximal RCA and first diagonal 2. The RCA lesion has progressed from previous.  3. There is stable moderate CAD in LAD 4. EF 30-35% by echo with elevated filling pressures 5. Moderate mixed pulmonary HTN   Objective:   Weight Range: 99.7 kg Body mass index is 32.47 kg/m.   Vital Signs:   Temp:  [98 F (36.7 C)-98.4 F (36.9 C)] 98.2 F (36.8 C) (02/01 1344) Pulse Rate:  [62-79] 73 (02/01 1344) Resp:  [22] 22 (02/01 0536) BP: (127-142)/(62-82) 136/73 (02/01 1344) SpO2:  [93 %-98 %] 98 % (02/01 1344) Weight:  [99.7 kg] 99.7 kg (02/01 0536) Last BM Date: 03/22/18  Weight change: Filed Weights   03/22/18 0350 03/23/18 0525 03/24/18 0536  Weight: 102.2 kg 99.8 kg 99.7 kg    Intake/Output:   Intake/Output Summary (Last 24 hours) at 03/24/2018 1351 Last data filed at 03/24/2018 1159 Gross per 24 hour  Intake 451.9 ml  Output 1325 ml  Net -873.1 ml      Physical Exam    General:  Sitting in bed. Well appearing. No  resp difficulty HEENT: normal Neck: supple. JVP hard to see  Carotids 2+ bilat; no bruits. No lymphadenopathy or thryomegaly appreciated. Cor: PMI nondisplaced. Regular rate & rhythm. No rubs, gallops or murmurs. Lungs: clear decreased at abses  Abdomen: soft, nontender, nondistended. No hepatosplenomegaly. No bruits or masses. Good bowel sounds. Extremities: no cyanosis, clubbing, rash, edema RUE picc Neuro: alert & orientedx3, cranial nerves grossly intact. moves all 4 extremities w/o difficulty. Affect pleasant   Telemetry   NSR 70-80 Personally reviewed  EKG    03/19/2018 NSR 86 bpm, personally reviewed  Labs    CBC No results for input(s): WBC, NEUTROABS, HGB, HCT, MCV, PLT in the last 72 hours. Basic Metabolic Panel Recent Labs    03/23/18 0508 03/24/18 0515  NA 138 138  K 3.8 4.2  CL 104 103  CO2 24 24  GLUCOSE 162* 248*  BUN 41* 44*  CREATININE 2.27* 2.28*  CALCIUM 8.1* 8.3*  MG 2.0  --    Liver Function Tests No results for input(s): AST, ALT, ALKPHOS, BILITOT, PROT, ALBUMIN in the last 72 hours. No results for input(s): LIPASE, AMYLASE in the last 72 hours. Cardiac Enzymes Recent Labs    03/22/18 0659 03/22/18 0907  TROPONINI 0.07* 0.07*    BNP: BNP (last 3 results) Recent Labs    12/08/17 1110  BNP 239.2*    ProBNP (last 3 results) No results for input(s): PROBNP in the last 8760 hours.   D-Dimer No results for input(s): DDIMER in the last 72 hours. Hemoglobin A1C No results for input(s): HGBA1C in the last 72 hours. Fasting Lipid Panel No results for input(s): CHOL, HDL, LDLCALC, TRIG, CHOLHDL, LDLDIRECT in the last 72 hours. Thyroid Function Tests No results for input(s): TSH, T4TOTAL, T3FREE, THYROIDAB in the last 72 hours.  Invalid input(s): FREET3  Other results:   Imaging    No results found.   Medications:     Scheduled Medications: . aspirin  81 mg Oral Daily  . carvedilol  6.25 mg Oral BID WC  . enoxaparin  (LOVENOX) injection  40 mg Subcutaneous Q24H  . furosemide  80 mg Intravenous BID  . hydrALAZINE  75 mg Oral TID  . insulin aspart  0-15 Units Subcutaneous TID WC  . insulin glargine  15 Units Subcutaneous QHS  . isosorbide mononitrate  120 mg Oral Daily  . pantoprazole  40 mg Oral Daily  . ranolazine  500 mg Oral BID  . rosuvastatin  10 mg Oral Daily  . sodium chloride flush  3 mL Intravenous Q12H  . sodium chloride flush  3 mL Intravenous Q12H  . spironolactone  12.5 mg Oral Daily  . ticagrelor  90 mg Oral BID    Infusions: . sodium chloride    . sodium chloride    . milrinone 0.125 mcg/kg/min (03/24/18 0711)    PRN Medications: sodium chloride, sodium chloride, acetaminophen, albuterol, alum & mag hydroxide-simeth, morphine injection, nitroGLYCERIN, ondansetron (ZOFRAN) IV, sodium chloride flush, sodium chloride flush    Patient Profile   Brad Mendoza is a 63 y.o. male with a past medical history of systolic HF due to NICM, HTN, HLD, DM and CAD.   Admitted from cath lab 03/19/18 with volume overload as below. Cath showed 90% RCA stenosis (worsened) and underwent PCI/DES to RCA by Dr. Ellyn Hack.   Assessment/Plan   1. Acute on chronic systolic CHF: Mixed ICM and NICM. He had preexisting NICM diagnosed in 2005 when his EF was 35% and had normal cors. Echo in 05/2016 with EF 30-35% and cath showed 70-75% stenosis in the proximal and mid LAD,90% ostial stenosis in the 1st diagonal, and 60-70% stenosis in the mid RCA. [place] 05/2016-  Fick C.O. 5.86/index 2.74 - Echo from 4/19 reviewed personally EF read as 40-45% but it is more like 25% - Echo 03/12/17 EF 30-35% - He feels better on milrinone but still with orthopnea. Recent REDS vest was 45% - I suspect he still has fluid on board.  - Will increase lasix to 120 IV bid and add metolazone 5mg  tonight.  - Continue milrinone 0.25 mcg o - Carvedilol decreased to 6.25 bid  - Continue imdur 120 mg daily.   - Continue hydralazine 75 mg  TID (increased 1/31).  - Continue spiro 12.5 mg daily  - Entresto has been on hold with mild AKI.  - Will eventually need ICD if EF remains < 35% post PCI in 3 months    2. CAD - s/p PCI/DES of RCA 03/19/2018. Other CAD stable. Manage medically  - CP has resolved - Continue isosorbide as above.  - Continue Ranexa. - Continue DAPT and statin.   3. ETOH abuse - Congratulated him on cessation.   4. Tobacco abuse - Chews tobacco.  - Encouraged complete cessation.   5. HTN - SBP 130-140 - Entresto on hold -  Hydralazine increased 1/31  6. CKD stage III - Baseline creatinine 1.7-2.2.  - Follows with Dr. Hinda Lenis - Creatinine stable at 2.2 on milrinone - BMET in am   7. DM - Per PCP. - He has been off Jardiance. Would like to restart as outpatient.  - Cover with SSI.   8. Fatigue - Pending sleep study 03/28/2018. Suspect he has severe OSA with pulmonary HTN    Length of Stay: 5  Glori Bickers, MD  03/24/2018, 1:51 PM  Advanced Heart Failure Team Pager [phone] (M-F; [place])  Please contact [facility] for night-coverage after hours (4p -7a ) and weekends on [email or web address]

## 2018-03-25 LAB — GLUCOSE, CAPILLARY
Glucose-Capillary: 132 mg/dL — ABNORMAL HIGH (ref 70–99)
Glucose-Capillary: 185 mg/dL — ABNORMAL HIGH (ref 70–99)
Glucose-Capillary: 232 mg/dL — ABNORMAL HIGH (ref 70–99)
Glucose-Capillary: 191 mg/dL — ABNORMAL HIGH (ref 70–99)

## 2018-03-25 LAB — BASIC METABOLIC PANEL
Anion gap: 12 (ref 5–15)
BUN: 41 mg/dL — ABNORMAL HIGH (ref 8–23)
CO2: 25 mmol/L (ref 22–32)
Calcium: 8.6 mg/dL — ABNORMAL LOW (ref 8.9–10.3)
Chloride: 102 mmol/L (ref 98–111)
Creatinine, Ser: 2.36 mg/dL — ABNORMAL HIGH (ref 0.61–1.24)
GFR calc Af Amer: 33 mL/min — ABNORMAL LOW (ref >60)
GFR calc non Af Amer: 28 mL/min — ABNORMAL LOW (ref >60)
Glucose, Bld: 139 mg/dL — ABNORMAL HIGH (ref 70–99)
Potassium: 3.9 mmol/L (ref 3.5–5.1)
Sodium: 139 mmol/L (ref 135–145)

## 2018-03-25 MED ORDER — METOLAZONE 5 MG PO TABS
5.0000 mg | ORAL_TABLET | Freq: Once | ORAL | Status: AC
  Administered 2018-03-25: 5 mg via ORAL
  Filled 2018-03-25: qty 1

## 2018-03-25 NOTE — Plan of Care (Signed)
  Problem: Education: Goal: Understanding of CV disease, CV risk reduction, and recovery process will improve Outcome: Progressing

## 2018-03-25 NOTE — Progress Notes (Signed)
Advanced Heart Failure Rounding Note  PCP-Cardiologist: No primary care provider on file.   Subjective:    Admitted from cath lab yesterday with volume overload as below. Cath showed 90% RCA stenosis (worsened) and underwent PCI/DES to RCA by Dr. Ellyn Hack.   Milrinone started 0.125 mcg on 1/30. Remains on IV lasix. I increased to 120 bid and added metolazone yesterday. Diuresis much improved. Weight down 5 pounds. No further orthopnea or PND. Able to ambulate halls. Creatinine 2.28 -> 2.36   [facility] 03/19/2018  Ost 1st Diag lesion is 95% stenosed.  Prox LAD to Mid LAD lesion is 60% stenosed. (stable)  2nd Diag lesion is 70% stenosed.  Mid Cx to Dist Cx lesion is 30% stenosed.  Mid RCA lesion is 70% stenosed.  Prox RCA lesion is 90% stenosed. (worse from previous)  Findings:  Ao = 153/81 (111) LV = 144/30 RA = 11 RV = 77/12 PA = 76/30 (50) PCW = 29 Fick cardiac output/index = 6.8/3.1 PVR = 3.0 WU Ao sat = 95% PA sat = 62%, 65%  Assessment: 1. 2v CAD with high-grade lesion in proximal RCA and first diagonal 2. The RCA lesion has progressed from previous.  3. There is stable moderate CAD in LAD 4. EF 30-35% by echo with elevated filling pressures 5. Moderate mixed pulmonary HTN   Objective:   Weight Range: 97.1 kg Body mass index is 31.62 kg/m.   Vital Signs:   Temp:  [98.3 F (36.8 C)-98.5 F (36.9 C)] 98.5 F (36.9 C) (02/02 1246) Pulse Rate:  [74-80] 76 (02/02 1246) BP: (124-159)/(64-95) 140/81 (02/02 1246) SpO2:  [94 %-100 %] 96 % (02/02 1246) Weight:  [97.1 kg] 97.1 kg (02/02 0435) Last BM Date: 03/23/18  Weight change: Filed Weights   03/23/18 0525 03/24/18 0536 03/25/18 0435  Weight: 99.8 kg 99.7 kg 97.1 kg    Intake/Output:   Intake/Output Summary (Last 24 hours) at 03/25/2018 1508 Last data filed at 03/25/2018 1200 Gross per 24 hour  Intake 1458.24 ml  Output 4910 ml  Net -3451.76 ml      Physical Exam    General:  Well  appearing. No resp difficulty HEENT: normal Neck: supple. JVP hard to assess Carotids 2+ bilat; no bruits. No lymphadenopathy or thryomegaly appreciated. Cor: PMI nondisplaced. Regular rate & rhythm. No rubs, gallops or murmurs. Lungs: clear Abdomen: soft, nontender, nondistended. No hepatosplenomegaly. No bruits or masses. Good bowel sounds. Extremities: no cyanosis, clubbing, rash, edema Neuro: alert & orientedx3, cranial nerves grossly intact. moves all 4 extremities w/o difficulty. Affect pleasant  Telemetry   NSR 70s Personally reviewed  EKG    03/19/2018 NSR 86 bpm, personally reviewed  Labs    CBC No results for input(s): WBC, NEUTROABS, HGB, HCT, MCV, PLT in the last 72 hours. Basic Metabolic Panel Recent Labs    03/23/18 0508 03/24/18 0515 03/25/18 0451  NA 138 138 139  K 3.8 4.2 3.9  CL 104 103 102  CO2 24 24 25   GLUCOSE 162* 248* 139*  BUN 41* 44* 41*  CREATININE 2.27* 2.28* 2.36*  CALCIUM 8.1* 8.3* 8.6*  MG 2.0  --   --    Liver Function Tests No results for input(s): AST, ALT, ALKPHOS, BILITOT, PROT, ALBUMIN in the last 72 hours. No results for input(s): LIPASE, AMYLASE in the last 72 hours. Cardiac Enzymes No results for input(s): CKTOTAL, CKMB, CKMBINDEX, TROPONINI in the last 72 hours.  BNP: BNP (last 3 results) Recent Labs  12/08/17 1110  BNP 239.2*    ProBNP (last 3 results) No results for input(s): PROBNP in the last 8760 hours.   D-Dimer No results for input(s): DDIMER in the last 72 hours. Hemoglobin A1C No results for input(s): HGBA1C in the last 72 hours. Fasting Lipid Panel No results for input(s): CHOL, HDL, LDLCALC, TRIG, CHOLHDL, LDLDIRECT in the last 72 hours. Thyroid Function Tests No results for input(s): TSH, T4TOTAL, T3FREE, THYROIDAB in the last 72 hours.  Invalid input(s): FREET3  Other results:   Imaging    No results found.   Medications:     Scheduled Medications: . aspirin  81 mg Oral Daily  .  carvedilol  6.25 mg Oral BID WC  . enoxaparin (LOVENOX) injection  40 mg Subcutaneous Q24H  . hydrALAZINE  75 mg Oral TID  . insulin aspart  0-15 Units Subcutaneous TID WC  . insulin glargine  15 Units Subcutaneous QHS  . isosorbide mononitrate  120 mg Oral Daily  . pantoprazole  40 mg Oral Daily  . ranolazine  500 mg Oral BID  . rosuvastatin  10 mg Oral Daily  . sodium chloride flush  3 mL Intravenous Q12H  . sodium chloride flush  3 mL Intravenous Q12H  . spironolactone  12.5 mg Oral Daily  . ticagrelor  90 mg Oral BID    Infusions: . sodium chloride    . sodium chloride    . furosemide 120 mg (03/25/18 0923)  . milrinone 0.125 mcg/kg/min (03/25/18 0719)    PRN Medications: sodium chloride, sodium chloride, acetaminophen, albuterol, alum & mag hydroxide-simeth, morphine injection, nitroGLYCERIN, ondansetron (ZOFRAN) IV, sodium chloride flush, sodium chloride flush    Patient Profile   Brad Mendoza is a 63 y.o. male with a past medical history of systolic HF due to NICM, HTN, HLD, DM and CAD.   Admitted from cath lab 03/19/18 with volume overload as below. Cath showed 90% RCA stenosis (worsened) and underwent PCI/DES to RCA by Dr. Ellyn Hack.   Assessment/Plan   1. Acute on chronic systolic CHF: Mixed ICM and NICM. He had preexisting NICM diagnosed in 2005 when his EF was 35% and had normal cors. Echo in 05/2016 with EF 30-35% and cath showed 70-75% stenosis in the proximal and mid LAD,90% ostial stenosis in the 1st diagonal, and 60-70% stenosis in the mid RCA. [place] 05/2016-  Fick C.O. 5.86/index 2.74 - Echo from 4/19 reviewed personally EF read as 40-45% but it is more like 25% - Echo 03/12/17 EF 30-35% - He feels better on milrinone but still had orthopnea yesterday. Recent REDS vest was 45% - Yesterday I increased lasix to 120 IV bid and add metolazone 5mg . Diuresis much improved.  - Creatinine up just slightly. Will continue current regimen today and repeat ReDS in am. If  ReDS < 40% can stop milrinone tomorrow - Will need to increase torsemide on d/c. With advanced cardiorenal syndrome, I am worried that prognosis is not great.  - Continue milrinone 0.125 mcg - Carvedilol decreased to 6.25 bid  - Continue imdur 120 mg daily.   - Continue hydralazine 75 mg TID (increased 1/31).  - Continue spiro 12.5 mg daily  - Entresto has been on hold with mild AKI.  - Will eventually need ICD if EF remains < 35% post PCI in 3 months    2. CAD - s/p PCI/DES of RCA 03/19/2018. Other CAD stable. Manage medically  - CP has resolved - Continue isosorbide as above.  - Continue Ranexa. -  Continue DAPT and statin.   3. ETOH abuse - Congratulated him on cessation.   4. Tobacco abuse - Chews tobacco.  - Encouraged complete cessation.   5. HTN - SBP 130-140 - Entresto on hold - Hydralazine increased 1/31  6. CKD stage III - Baseline creatinine 1.7-2.2.  - Follows with Dr. Hinda Lenis - Creatinine stable at 2.2 on milrinone - BMET in am   7. DM - Per PCP. - He has been off Jardiance. Would like to restart as outpatient.  - Cover with SSI.   8. Fatigue - Pending sleep study 03/28/2018. Suspect he has severe OSA with pulmonary HTN    Length of Stay: 6  Glori Bickers, MD  03/25/2018, 3:08 PM  Advanced Heart Failure Team Pager [phone] (M-F; 7a - 4p)  Please contact [facility] for night-coverage after hours (4p -7a ) and weekends on [email or web address]

## 2018-03-26 LAB — BASIC METABOLIC PANEL
Anion gap: 11 (ref 5–15)
BUN: 48 mg/dL — AB (ref 8–23)
CHLORIDE: 102 mmol/L (ref 98–111)
CO2: 25 mmol/L (ref 22–32)
Calcium: 8.7 mg/dL — ABNORMAL LOW (ref 8.9–10.3)
Creatinine, Ser: 2.62 mg/dL — ABNORMAL HIGH (ref 0.61–1.24)
GFR calc Af Amer: 29 mL/min — ABNORMAL LOW (ref >60)
GFR calc non Af Amer: 25 mL/min — ABNORMAL LOW (ref >60)
Glucose, Bld: 130 mg/dL — ABNORMAL HIGH (ref 70–99)
Potassium: 4 mmol/L (ref 3.5–5.1)
Sodium: 138 mmol/L (ref 135–145)

## 2018-03-26 LAB — MAGNESIUM: Magnesium: 2.2 mg/dL (ref 1.7–2.4)

## 2018-03-26 LAB — GLUCOSE, CAPILLARY
Glucose-Capillary: 138 mg/dL — ABNORMAL HIGH (ref 70–99)
Glucose-Capillary: 190 mg/dL — ABNORMAL HIGH (ref 70–99)
Glucose-Capillary: 256 mg/dL — ABNORMAL HIGH (ref 70–99)
Glucose-Capillary: 135 mg/dL — ABNORMAL HIGH (ref 70–99)

## 2018-03-26 MED ORDER — HYDRALAZINE HCL 50 MG PO TABS
100.0000 mg | ORAL_TABLET | Freq: Three times a day (TID) | ORAL | Status: DC
  Administered 2018-03-26 – 2018-03-27 (×3): 100 mg via ORAL
  Filled 2018-03-26 (×3): qty 2

## 2018-03-26 NOTE — Care Management Note (Signed)
Case Management Note Initial CM Note documented by Tomi Bamberger, RNCM  Patient Details  Name: Brad Mendoza MRN: [id number] Date of Birth: 1955/03/27  Subjective/Objective:   From home alone, s/p stent intervention will be on brilinta, NCM spoke with patient gave him the copay information for the Brilinta and the preferred pharmacy information.  He states he will need HHRN for CHF disease management and he will be going to stay at his [name])  address at [address], [place] [address], phone is [phone].  NCM offered choice from Medicare.gov list , he chose [facility] [place], referral made to [name] with [facility] for [facility] for CHF disease management, soc will begin 24-48 hrs post dc.                  Action/Plan: DC home when ready.  Expected Discharge Date:                  Expected Discharge Plan:  [place]  In-House Referral:  NA  Discharge planning Services  CM Consult, Medication Assistance  Post Acute Care Choice:  Home Health Choice offered to:  Patient  DME Arranged:  Heart failure home health orders DME Agency:  NA  HH Arranged:  RN, Disease Management(ReDs vest protocol) [place]:  [facility] (now [facility])  Status of Service:  In process, will continue to follow  If discussed at Long Length of Stay Meetings, dates discussed:    Additional Comments: 03/26/18 @ 1403-Lynze Reddy RNCM-CM consult received for arranging [facility] with ReDs Vest protocol. Previous NCM, Tomi Bamberger, had arranged HHRN with [facility] for CHF disease management. CM spoke to [place], [facility] liaison, to discuss POC and need for [facility] ReDs program at discharge; per [place], [facility] is unable to provide. [place] referral given to [facility]; AVS updated for [facility] ReDS program. CM team will continue to follow.   Midge Minium RN, BSN, NCM-BC, ACM-RN [phone] 03/26/2018, 2:02 PM

## 2018-03-26 NOTE — Progress Notes (Addendum)
Advanced Heart Failure Rounding Note  PCP-Cardiologist: No primary care provider on file.   Subjective:    Admitted from cath lab with volume overload as below. Cath showed 90% RCA stenosis (worsened) and underwent PCI/DES to RCA by Dr. Ellyn Hack.   Milrinone started 0.125 mcg on 1/30. Remains on 120 mg IV lasix BID. Received metolazone 5 mg x1 yesterday. Great diuresis. Weight down another 7lbs. Creatinine trending up 2.36 -> 2.62. SBP 130-150s. 3 beat NSVT yesterday.  Denies CP, SOB, or dizziness. Wants to go home.   [facility] 03/19/2018  Ost 1st Diag lesion is 95% stenosed.  Prox LAD to Mid LAD lesion is 60% stenosed. (stable)  2nd Diag lesion is 70% stenosed.  Mid Cx to Dist Cx lesion is 30% stenosed.  Mid RCA lesion is 70% stenosed.  Prox RCA lesion is 90% stenosed. (worse from previous)  Findings:  Ao = 153/81 (111) LV = 144/30 RA = 11 RV = 77/12 PA = 76/30 (50) PCW = 29 Fick cardiac output/index = 6.8/3.1 PVR = 3.0 WU Ao sat = 95% PA sat = 62%, 65%  Assessment: 1. 2v CAD with high-grade lesion in proximal RCA and first diagonal 2. The RCA lesion has progressed from previous.  3. There is stable moderate CAD in LAD 4. EF 30-35% by echo with elevated filling pressures 5. Moderate mixed pulmonary HTN   Objective:   Weight Range: 94.1 kg Body mass index is 30.63 kg/m.   Vital Signs:   Temp:  [98.3 F (36.8 C)-98.5 F (36.9 C)] 98.3 F (36.8 C) (02/03 0407) Pulse Rate:  [74-78] 74 (02/03 0407) Resp:  [22] 22 (02/03 0407) BP: (132-142)/(78-88) 142/88 (02/03 0407) SpO2:  [96 %-100 %] 100 % (02/03 0407) Weight:  [94.1 kg] 94.1 kg (02/03 0407) Last BM Date: 03/23/18  Weight change: Filed Weights   03/24/18 0536 03/25/18 0435 03/26/18 0407  Weight: 99.7 kg 97.1 kg 94.1 kg    Intake/Output:   Intake/Output Summary (Last 24 hours) at 03/26/2018 0855 Last data filed at 03/26/2018 0707 Gross per 24 hour  Intake 784.02 ml  Output 3825 ml  Net  -3040.98 ml      Physical Exam    General: Well appearing. No resp difficulty. Sitting on side of bed.  HEENT: Normal Neck: Supple. JVP does not appear elevated. Carotids 2+ bilat; no bruits. No thyromegaly or nodule noted. Cor: PMI nondisplaced. RRR, No M/G/R noted Lungs: CTAB, normal effort. Abdomen: Soft, non-tender, non-distended, no HSM. No bruits or masses. +BS  Extremities: No cyanosis, clubbing, or rash. R and LLE no edema.  Neuro: Alert & orientedx3, cranial nerves grossly intact. moves all 4 extremities w/o difficulty. Affect pleasant  Telemetry   NSR 70s. 1 3 beat run of NSVT. Personally reviewed  EKG   No new tracings.   Labs    CBC No results for input(s): WBC, NEUTROABS, HGB, HCT, MCV, PLT in the last 72 hours. Basic Metabolic Panel Recent Labs    03/25/18 0451 03/26/18 0541  NA 139 138  K 3.9 4.0  CL 102 102  CO2 25 25  GLUCOSE 139* 130*  BUN 41* 48*  CREATININE 2.36* 2.62*  CALCIUM 8.6* 8.7*   Liver Function Tests No results for input(s): AST, ALT, ALKPHOS, BILITOT, PROT, ALBUMIN in the last 72 hours. No results for input(s): LIPASE, AMYLASE in the last 72 hours. Cardiac Enzymes No results for input(s): CKTOTAL, CKMB, CKMBINDEX, TROPONINI in the last 72 hours.  BNP: BNP (last 3 results)  Recent Labs    12/08/17 1110  BNP 239.2*    ProBNP (last 3 results) No results for input(s): PROBNP in the last 8760 hours.   D-Dimer No results for input(s): DDIMER in the last 72 hours. Hemoglobin A1C No results for input(s): HGBA1C in the last 72 hours. Fasting Lipid Panel No results for input(s): CHOL, HDL, LDLCALC, TRIG, CHOLHDL, LDLDIRECT in the last 72 hours. Thyroid Function Tests No results for input(s): TSH, T4TOTAL, T3FREE, THYROIDAB in the last 72 hours.  Invalid input(s): FREET3  Other results:   Imaging    No results found.   Medications:     Scheduled Medications: . aspirin  81 mg Oral Daily  . carvedilol  6.25 mg Oral  BID WC  . enoxaparin (LOVENOX) injection  40 mg Subcutaneous Q24H  . hydrALAZINE  75 mg Oral TID  . insulin aspart  0-15 Units Subcutaneous TID WC  . insulin glargine  15 Units Subcutaneous QHS  . isosorbide mononitrate  120 mg Oral Daily  . pantoprazole  40 mg Oral Daily  . ranolazine  500 mg Oral BID  . rosuvastatin  10 mg Oral Daily  . sodium chloride flush  3 mL Intravenous Q12H  . sodium chloride flush  3 mL Intravenous Q12H  . spironolactone  12.5 mg Oral Daily  . ticagrelor  90 mg Oral BID    Infusions: . sodium chloride    . sodium chloride    . furosemide 120 mg (03/25/18 1730)  . milrinone 0.125 mcg/kg/min (03/26/18 0829)    PRN Medications: sodium chloride, sodium chloride, acetaminophen, albuterol, alum & mag hydroxide-simeth, morphine injection, nitroGLYCERIN, ondansetron (ZOFRAN) IV, sodium chloride flush, sodium chloride flush    Patient Profile   Brad Mendoza is a 63 y.o. male with a past medical history of systolic HF due to NICM, HTN, HLD, DM and CAD.   Admitted from cath lab 03/19/18 with volume overload as below. Cath showed 90% RCA stenosis (worsened) and underwent PCI/DES to RCA by Dr. Ellyn Hack.   Assessment/Plan   1. Acute on chronic systolic CHF: Mixed ICM and NICM. He had preexisting NICM diagnosed in 2005 when his EF was 35% and had normal cors. Echo in 05/2016 with EF 30-35% and cath showed 70-75% stenosis in the proximal and mid LAD,90% ostial stenosis in the 1st diagonal, and 60-70% stenosis in the mid RCA. [place] 05/2016-  Fick C.O. 5.86/index 2.74 - Echo from 4/19 reviewed personally EF read as 40-45% but it is more like 25% - Echo 03/12/17 EF 30-35% - Remains on milrinone 0.125 mcg/kg/min. Repeat ReDS today. If <40%, will stop milrinone. (was 45% previously) - Received lasix 120 IV bid and metolazone 5mg  yesterday. Hold this am with AKI. Creatinine up to 2.6. - Will need to increase torsemide on d/c. With advanced cardiorenal syndrome, I am worried  that prognosis is not great.  - Carvedilol decreased to 6.25 bid  - Continue imdur 120 mg daily.   - Increase hydralazine to 100 mg TID - Hold spiro today with AKI. Delene Loll has been on hold with mild AKI.  - Will eventually need ICD if EF remains < 35% post PCI in 3 months    2. CAD - s/p PCI/DES of RCA 03/19/2018. Other CAD stable. Manage medically  - No further CP - Continue isosorbide as above.  - Continue Ranexa. - Continue DAPT and statin  3. ETOH abuse - Congratulated him on cessation. No change.  4. Tobacco abuse - Chews tobacco.  -  Encouraged complete cessation. No change.   5. HTN - SBP 130-150s - Entresto on hold. Arlyce Harman held today with AKI. - As above, increase hydralazine.  6. CKD stage III - Baseline creatinine 1.7-2.2.  - Follows with Dr. Hinda Lenis - Creatinine 2.3 -> 2.6 today. Hold diuresis and spiro.   7. DM - He has been off Jardiance. Would like to restart as outpatient.  - Cover with SSI. No change.   8. Fatigue - Pending sleep study 03/28/2018. Suspect he has severe OSA with pulmonary HTN. No change.   9. NSVT - K 4.0. Add on mag.   Check ReDS clip reading today. If <40%, will stop milrinone. Diuretics on hold with AKI. Will likely need to stay one more day.   Addendum: Clip reading is 33%. Will stop milrinone today.   Length of Stay: [place], NP  03/26/2018, 8:55 AM  Advanced Heart Failure Team Pager [phone] (M-F; 7a - 4p)  Please contact [facility] for night-coverage after hours (4p -7a ) and weekends on [email or web address]  Patient seen and examined with the above-signed Advanced Practice Provider and/or Housestaff. I personally reviewed laboratory data, imaging studies and relevant notes. I independently examined the patient and formulated the important aspects of the plan. I have edited the note to reflect any of my changes or salient points. I have personally discussed the plan with the patient and/or family.  Volume status  finally improved. ReDS reading 33%. Can stop milrinone. Creatinine up slightly. Will hold diuretics today. No CP.   Overall improved but he has severe cardiorenal syndrome and I am worried about his prognosis. Wil enroll in [facility] ReDS program on d/c.  Glori Bickers, MD  2:02 PM

## 2018-03-26 NOTE — Discharge Summary (Addendum)
Advanced Heart Failure Discharge Note  Discharge Summary   Patient ID: Brad Mendoza MRN: [id number], DOB/AGE: 62-12-1955 62 y.o. Admit date: 03/19/2018 D/C date:     03/27/2018   Primary Discharge Diagnoses:  1. Acute on chronic systolic CHF 2. CAD - S/p DES to RCA 03/19/2018. 3. ETOH abuse 4. Tobacco abuse 5. HTN 6. AKI on CKD 3 7. DM 8. Fatigue 9. NSVT  Hospital Course: TOMIO KIRK is a 63 y.o. male with a past medical history of systolic HF due to NICM, HTN, HLD, DM and CAD.   He was admitted from cath lab 03/19/18 with volume overload. Cath showed 90% RCA stenosis (worsened) and he underwent PCI/DES to RCA by Dr. Ellyn Hack. He was started on Brilinta in addition to his home ASA. Brilinta was provided through [facility].   HF team was consulted due to cardiorenal syndrome. He was started on milrinone 0.125 mcg/kg/min to augment diuresis. He diuresed with high dose IV lasix and metolazone and transitioned to torsemide 60 mg daily + metolazone 5 mg on Mondays and Fridays. ReDS clip reading improved from 45% to 33%. He was set up with [facility] vest protocol for home. HF medications optimized. Entresto and spiro held with AKI. Coreg decreased with need for milrinone.    Course complicated by AKI. Entresto and spiro were stopped. Creatinine peaked at 2.62, was down to 2.50 on day of discharge. Jardiance was held during admission and restarted at discharge. He was instructed to hold metformin for two days, then restart.  He will be followed closely in the HF clinic, with appointment as below. He will be followed by [facility] for vest protocol. He will need a BMET and referral for sleep study at follow up.   Discharge Weight Range: 206 lbs.  Discharge Vitals: Blood pressure 130/89, pulse 78, temperature 98.2 F (36.8 C), temperature source Oral, resp. rate 18, height 5\' 9"  (1.753 m), weight 93.5 kg, SpO2 99 %.  Labs: Lab Results  Component Value Date   WBC 7.3 03/20/2018   HGB 9.0  (L) 03/20/2018   HCT 27.8 (L) 03/20/2018   MCV 83.0 03/20/2018   PLT 183 03/20/2018    Recent Labs  Lab 03/27/18 0353  NA 136  K 4.3  CL 103  CO2 24  BUN 49*  CREATININE 2.50*  CALCIUM 8.3*  GLUCOSE 269*   Lab Results  Component Value Date   CHOL 215 (H) 05/30/2016   HDL 66 05/30/2016   LDLCALC 133 (H) 05/30/2016   TRIG 82 05/30/2016   BNP (last 3 results) Recent Labs    12/08/17 1110  BNP 239.2*    ProBNP (last 3 results) No results for input(s): PROBNP in the last 8760 hours.   Diagnostic Studies/Procedures   Echo 03/12/18 EF 30-35%, diffuse HK, grade 2 DD, LA severely dilated, mild TR, PA peak pressure 82 mm Hg  [facility] 03/19/2018  Ost 1st Diag lesion is 95% stenosed.  Prox LAD to Mid LAD lesion is 60% stenosed.(stable)  2nd Diag lesion is 70% stenosed.  Mid Cx to Dist Cx lesion is 30% stenosed.  Mid RCA lesion is 70% stenosed.  Prox RCA lesion is 90% stenosed.(worse from previous)  Findings: Ao = 153/81 (111) LV = 144/30 RA = 11 RV = 77/12 PA = 76/30 (50) PCW = 29 Fick cardiac output/index = 6.8/3.1 PVR = 3.0 WU Ao sat = 95% PA sat = 62%, 65%  Assessment: 1. 2v CAD with high-grade lesion in proximal RCA and  first diagonal 2. The RCA lesion has progressed from previous.  3. There is stable moderate CAD in LAD 4. EF 30-35% by echo with elevated filling pressures 5. Moderate mixed pulmonary HTN   Discharge Medications   Allergies as of 03/27/2018      Reactions   Sulfa Antibiotics Shortness Of Breath   Penicillins Rash, Other (See Comments)   Has patient had a PCN reaction causing immediate rash, facial/tongue/throat swelling, SOB or lightheadedness with hypotension: No Has patient had a PCN reaction causing severe rash involving mucus membranes or skin necrosis: No Has patient had a PCN reaction that required hospitalization No Has patient had a PCN reaction occurring within the last 10 years: No If all of the above answers are "NO",  then may proceed with Cephalosporin use.      Medication List    STOP taking these medications   sacubitril-valsartan 97-103 MG Commonly known as:  ENTRESTO   spironolactone 25 MG tablet Commonly known as:  ALDACTONE     TAKE these medications   albuterol 108 (90 Base) MCG/ACT inhaler Commonly known as:  PROVENTIL HFA;VENTOLIN HFA Inhale 2 puffs into the lungs every 6 (six) hours as needed for wheezing or shortness of breath.   aspirin EC 81 MG tablet Take 81 mg by mouth daily.   carvedilol 6.25 MG tablet Commonly known as:  COREG Take 1 tablet (6.25 mg total) by mouth 2 (two) times daily with a meal. What changed:    medication strength  how much to take  how to take this  when to take this  additional instructions   CENTRUM SILVER ADULT 50+ PO Take 1 tablet by mouth daily.   empagliflozin 10 MG Tabs tablet Commonly known as:  JARDIANCE Take 10 mg by mouth daily.   Fish Oil 1000 MG Cpdr Take 1,000 mg by mouth daily.   fluticasone 50 MCG/ACT nasal spray Commonly known as:  FLONASE USE 2 SPRAYS IN EACH NOSTRIL DAILY What changed:    how much to take  when to take this  reasons to take this   hydrALAZINE 100 MG tablet Commonly known as:  APRESOLINE Take 1 tablet (100 mg total) by mouth 3 (three) times daily. What changed:    medication strength  how much to take   [place] 1 application topically 2 (two) times daily as needed (back pain).   isosorbide mononitrate 120 MG 24 hr tablet Commonly known as:  IMDUR Take 1 tablet (120 mg total) by mouth daily. Start taking on:  March 28, 2018 What changed:    medication strength  how much to take   loratadine-pseudoephedrine 5-120 MG tablet Commonly known as:  CLARITIN-D 12 HOUR Take 1 tablet by mouth 2 (two) times daily.   metFORMIN 500 MG 24 hr tablet Commonly known as:  GLUCOPHAGE-XR Take 1 tablet (500 mg total) by mouth daily with breakfast. Start taking on:   March 29, 2018 What changed:  These instructions start on March 29, 2018. If you are unsure what to do until then, ask your doctor or other care provider.   metolazone 5 MG tablet Commonly known as:  ZAROXOLYN Take 1 tablet (5 mg total) by mouth 2 (two) times a week. Take on Mondays and Fridays Start taking on:  March 29, 2018   nitroGLYCERIN 0.4 MG SL tablet Commonly known as:  NITROSTAT DISSOLVE 1 TAB UNDER TOUNGE FOR CHEST PAIN. MAY REPEAT EVERY 5 MINUTES FOR 3 DOSES. IF NO RELIEF  CALL 911 OR GO TO ER What changed:  See the new instructions.   pantoprazole 40 MG tablet Commonly known as:  PROTONIX TAKE ONE (1) TABLET EACH DAY What changed:    how much to take  how to take this  when to take this  additional instructions   ranolazine 500 MG 12 hr tablet Commonly known as:  RANEXA TAKE ONE TABLET BY MOUTH TWICE DAILY   rosuvastatin 10 MG tablet Commonly known as:  CRESTOR TAKE ONE (1) TABLET EACH DAY What changed:    how much to take  how to take this  when to take this  additional instructions   terbinafine 1 % cream Commonly known as:  LAMISIL Apply 1 application topically 2 (two) times daily. Apply to both feet and between toes   ticagrelor 90 MG Tabs tablet Commonly known as:  BRILINTA Take 1 tablet (90 mg total) by mouth 2 (two) times daily.   torsemide 20 MG tablet Commonly known as:  DEMADEX Take 3 tablets (60 mg total) by mouth daily. What changed:  how much to take   TOUJEO SOLOSTAR 300 UNIT/ML Sopn Generic drug:  Insulin Glargine (1 Unit Dial) Inject 40 Units into the skin every morning AND 15 Units every evening. What changed:  See the new instructions.   Vitamin D3 25 MCG (1000 UT) Caps Take 2,000 Units by mouth daily.            Durable Medical Equipment  (From admission, onward)         Start     Ordered   03/26/18 1154  Heart failure home health orders  (Heart failure home health orders / Face to face)  Once      Comments:  Heart Failure Follow-up Care:  Verify follow-up appointments per Patient Discharge Instructions. Confirm transportation arranged. Reconcile home medications with discharge medication list. Remove discontinued medications from use. Assist patient/caregiver to manage medications using pill box. Reinforce low sodium food selection Assessments: Vital signs and oxygen saturation at each visit. Assess home environment for safety concerns, caregiver support and availability of low-sodium foods. Consult Education officer, museum, PT/OT, Dietitian, and CNA based on assessments. Perform comprehensive cardiopulmonary assessment. Notify MD for any change in condition or weight gain of 3 pounds in one day or 5 pounds in one week with symptoms. Daily Weights and Symptom Monitoring: Ensure patient has access to scales. Teach patient/caregiver to weigh daily before breakfast and after voiding using same scale and record.    Teach patient/caregiver to track weight and symptoms and when to notify Provider. Activity: Develop individualized activity plan with patient/caregiver.  Please initiate REDS vest protocol. Prefer [facility].  Question Answer Comment  Heart Failure Follow-up Care Advanced Heart Failure (AHF) Clinic at [phone]   Obtain the following labs Other see comments   Lab frequency Other see comments   Fax lab results to AHF Clinic at [phone]   Diet Low Sodium Heart Healthy   Fluid restrictions: 2000 mL Fluid   Initiate Heart Failure Clinic Diuretic Protocol to be used by [place] only ( to be ordered by Heart Failure Team Providers Only) Yes      03/26/18 1155   03/20/18 0932  Heart failure home health orders  (Heart failure home health orders / Face to face)  Once    Comments:  Heart Failure Follow-up Care:  Verify follow-up appointments per Patient Discharge Instructions. Confirm transportation arranged. Reconcile home medications with discharge medication list.  Remove discontinued medications  from use. Assist patient/caregiver to manage medications using pill box. Reinforce low sodium food selection Assessments: Vital signs and oxygen saturation at each visit. Assess home environment for safety concerns, caregiver support and availability of low-sodium foods. Consult Education officer, museum, PT/OT, Dietitian, and CNA based on assessments. Perform comprehensive cardiopulmonary assessment. Notify MD for any change in condition or weight gain of 3 pounds in one day or 5 pounds in one week with symptoms. Daily Weights and Symptom Monitoring: Ensure patient has access to scales. Teach patient/caregiver to weigh daily before breakfast and after voiding using same scale and record.    Teach patient/caregiver to track weight and symptoms and when to notify Provider. Activity: Develop individualized activity plan with patient/caregiver.   Question Answer Comment  Heart Failure Follow-up Care Advanced Heart Failure (AHF) Clinic at [phone]   Lab frequency Other see comments   Fax lab results to AHF Clinic at [phone]   Diet Low Sodium Heart Healthy   Fluid restrictions: 2000 mL Fluid      03/20/18 0932          Disposition   The patient will be discharged in stable condition to home. Discharge Instructions    (HEART FAILURE PATIENTS) Call MD:  Anytime you have any of the following symptoms: 1) 3 pound weight gain in 24 hours or 5 pounds in 1 week 2) shortness of breath, with or without a dry hacking cough 3) swelling in the hands, feet or stomach 4) if you have to sleep on extra pillows at night in order to breathe.   Complete by:  As directed    AMB Referral to Cardiac Rehabilitation - Phase II   Complete by:  As directed    Diagnosis:   Coronary Stents Stable Angina     Amb Referral to Cardiac Rehabilitation   Complete by:  As directed    Diagnosis:  Coronary Stents   Call MD for:  difficulty breathing, headache or visual disturbances    Complete by:  As directed    Call MD for:  persistant dizziness or light-headedness   Complete by:  As directed    Diet - low sodium heart healthy   Complete by:  As directed    Heart Failure patients record your daily weight using the same scale at the same time of day   Complete by:  As directed    Increase activity slowly   Complete by:  As directed    STOP any activity that causes chest pain, shortness of breath, dizziness, sweating, or exessive weakness   Complete by:  As directed      Follow-up Information    Home, [facility] Follow up.   Specialty:  Home Health Services Why:  Home Health Registered Nurse; ReDs vest heart failure program Contact information: [address] [address] [phone]        Jolaine Artist, MD Follow up on 04/09/2018.   Specialty:  Cardiology Why:  2:30 pm in HF clinic. Park in garage off [address] near [place]. There is a Scientist, clinical (histocompatibility and immunogenetics) that says "heart and vascular". Garage code for February is [id number]. Go to first floor to check in. Bring medications with you.  Contact information: [address] [place] [address] [phone]             Duration of Discharge Encounter: Greater than 35 minutes   Signed, Georgiana Shore, NP 03/27/2018, 10:36 AM  Patient seen and examined with  the above-signed Energy manager and/or Housestaff. I personally reviewed laboratory data, imaging studies and relevant notes. I independently examined the patient and formulated the important aspects of the plan. I have edited the note to reflect any of my changes or salient points. I have personally discussed the plan with the patient and/or family.  Much improved. [place] for d/c today. He has severe cardiorenal syndrome so volume management likely to be an ongoing issue. Agree with plan as above.   Glori Bickers, MD  11:55 AM

## 2018-03-26 NOTE — Progress Notes (Signed)
ReDS Vest - 03/26/18 0939      ReDS Vest   Fitting Posture  Sitting    Height Marker  Tall    Ruler Value  35   station D   Center Strip  Aligned    ReDS Value  33

## 2018-03-26 NOTE — Progress Notes (Signed)
CARDIAC REHAB PHASE I   PRE:  Rate/Rhythm: 76 SR  BP:  Supine: 111/58  Sitting:   Standing:    SaO2: 95%RA  MODE:  Ambulation: 500 ft   POST:  Rate/Rhythm: 83 SR  BP:  Supine:   Sitting: 113/68  Standing:    SaO2: 93%RA 0950-1030 Pt walked 500 ft on RA with slow steady gait. Likes to look out the windows. No DOE noted, sats good on RA. Pt requested more sugarless gum to help since no snuff. Gave pt more gum and encouraged cessation of tobacco.  Back to bed after walk.   Graylon Good, RN BSN  03/26/2018 10:27 AM

## 2018-03-26 NOTE — Care Management Important Message (Signed)
Important Message  Patient Details  Name: Brad Mendoza MRN: [id number] Date of Birth: [birth year]   Medicare Important Message Given:  Yes    [name] P Wingo 03/26/2018, 4:14 PM

## 2018-03-27 LAB — BASIC METABOLIC PANEL
Anion gap: 9 (ref 5–15)
BUN: 49 mg/dL — ABNORMAL HIGH (ref 8–23)
CHLORIDE: 103 mmol/L (ref 98–111)
CO2: 24 mmol/L (ref 22–32)
Calcium: 8.3 mg/dL — ABNORMAL LOW (ref 8.9–10.3)
Creatinine, Ser: 2.5 mg/dL — ABNORMAL HIGH (ref 0.61–1.24)
GFR calc Af Amer: 31 mL/min — ABNORMAL LOW (ref >60)
GFR calc non Af Amer: 27 mL/min — ABNORMAL LOW (ref >60)
Glucose, Bld: 269 mg/dL — ABNORMAL HIGH (ref 70–99)
Potassium: 4.3 mmol/L (ref 3.5–5.1)
Sodium: 136 mmol/L (ref 135–145)

## 2018-03-27 LAB — GLUCOSE, CAPILLARY: Glucose-Capillary: 210 mg/dL — ABNORMAL HIGH (ref 70–99)

## 2018-03-27 MED ORDER — METFORMIN HCL ER 500 MG PO TB24: 500.0000 mg | ORAL_TABLET | Freq: Every day | ORAL | 0 refills | Status: DC

## 2018-03-27 MED ORDER — HYDRALAZINE HCL 100 MG PO TABS: 100.0000 mg | ORAL_TABLET | Freq: Three times a day (TID) | ORAL | 5 refills | Status: DC

## 2018-03-27 MED ORDER — TORSEMIDE 20 MG PO TABS: 60.0000 mg | ORAL_TABLET | Freq: Every day | ORAL | 5 refills | Status: DC

## 2018-03-27 MED ORDER — CARVEDILOL 6.25 MG PO TABS: 6.2500 mg | ORAL_TABLET | Freq: Two times a day (BID) | ORAL | 5 refills | Status: DC

## 2018-03-27 MED ORDER — METOLAZONE 5 MG PO TABS: 5.0000 mg | ORAL_TABLET | ORAL | 5 refills | Status: DC

## 2018-03-27 MED ORDER — ISOSORBIDE MONONITRATE ER 120 MG PO TB24: 120.0000 mg | ORAL_TABLET | Freq: Every day | ORAL | 5 refills | Status: DC

## 2018-03-27 NOTE — Progress Notes (Addendum)
Advanced Heart Failure Rounding Note  PCP-Cardiologist: No primary care provider on file.   Subjective:    Admitted from cath lab with volume overload as below. Cath showed 90% RCA stenosis (worsened) and underwent PCI/DES to RCA by Dr. Ellyn Hack.   Milrinone started 0.125 mcg on 1/30. Remains on 120 mg IV lasix BID. Received metolazone 5 mg x1 yesterday. Great diuresis. Weight down another 7lbs. Creatinine trending up 2.36 -> 2.62 -> 2.50. [name]   Feeling good this am. Glad to go home.  Denies CP. No further SOB.   [facility] 03/19/2018  Ost 1st Diag lesion is 95% stenosed.  Prox LAD to Mid LAD lesion is 60% stenosed. (stable)  2nd Diag lesion is 70% stenosed.  Mid Cx to Dist Cx lesion is 30% stenosed.  Mid RCA lesion is 70% stenosed.  Prox RCA lesion is 90% stenosed. (worse from previous)  Findings:  Ao = 153/81 (111) LV = 144/30 RA = 11 RV = 77/12 PA = 76/30 (50) PCW = 29 Fick cardiac output/index = 6.8/3.1 PVR = 3.0 WU Ao sat = 95% PA sat = 62%, 65%  Assessment: 1. 2v CAD with high-grade lesion in proximal RCA and first diagonal 2. The RCA lesion has progressed from previous.  3. There is stable moderate CAD in LAD 4. EF 30-35% by echo with elevated filling pressures 5. Moderate mixed pulmonary HTN   Objective:   Weight Range: 93.5 kg Body mass index is 30.44 kg/m.   Vital Signs:   Temp:  [98.1 F (36.7 C)-98.3 F (36.8 C)] 98.2 F (36.8 C) (02/04 0400) Pulse Rate:  [72-78] 78 (02/04 0400) Resp:  [18] 18 (02/04 0400) BP: (120-134)/(64-89) 130/89 (02/04 0817) SpO2:  [97 %-100 %] 99 % (02/04 0400) Weight:  [93.5 kg] 93.5 kg (02/04 0400) Last BM Date: 03/23/18  Weight change: Filed Weights   03/25/18 0435 03/26/18 0407 03/27/18 0400  Weight: 97.1 kg 94.1 kg 93.5 kg   Intake/Output:  Intake/Output Summary (Last 24 hours) at 03/27/2018 0925 Last data filed at 03/27/2018 0800 Gross per 24 hour  Intake 901.18 ml  Output 2225 ml  Net -1323.82 ml     Physical Exam    General: Well appearing. No resp difficulty. HEENT: Normal Neck: Supple. JVP 5-6. Carotids 2+ bilat; no bruits. No thyromegaly or nodule noted. Cor: PMI nondisplaced. RRR, No M/G/R noted Lungs: CTAB, normal effort. Abdomen: Soft, non-tender, non-distended, no HSM. No bruits or masses. +BS  Extremities: No cyanosis, clubbing, or rash. R and LLE no edema.  Neuro: Alert & orientedx3, cranial nerves grossly intact. moves all 4 extremities w/o difficulty. Affect pleasant   Telemetry   NSR 70s, personally reviewed.    EKG   No new tracings.    Labs    CBC No results for input(s): WBC, NEUTROABS, HGB, HCT, MCV, PLT in the last 72 hours. Basic Metabolic Panel Recent Labs    03/26/18 0541 03/27/18 0353  NA 138 136  K 4.0 4.3  CL 102 103  CO2 25 24  GLUCOSE 130* 269*  BUN 48* 49*  CREATININE 2.62* 2.50*  CALCIUM 8.7* 8.3*  MG 2.2  --    Liver Function Tests No results for input(s): AST, ALT, ALKPHOS, BILITOT, PROT, ALBUMIN in the last 72 hours. No results for input(s): LIPASE, AMYLASE in the last 72 hours. Cardiac Enzymes No results for input(s): CKTOTAL, CKMB, CKMBINDEX, TROPONINI in the last 72 hours.  BNP: BNP (last 3 results) Recent Labs    12/08/17  1110  BNP 239.2*    ProBNP (last 3 results) No results for input(s): PROBNP in the last 8760 hours.   D-Dimer No results for input(s): DDIMER in the last 72 hours. Hemoglobin A1C No results for input(s): HGBA1C in the last 72 hours. Fasting Lipid Panel No results for input(s): CHOL, HDL, LDLCALC, TRIG, CHOLHDL, LDLDIRECT in the last 72 hours. Thyroid Function Tests No results for input(s): TSH, T4TOTAL, T3FREE, THYROIDAB in the last 72 hours.  Invalid input(s): FREET3  Other results:   Imaging    No results found.   Medications:     Scheduled Medications: . aspirin  81 mg Oral Daily  . carvedilol  6.25 mg Oral BID WC  . enoxaparin (LOVENOX) injection  40 mg Subcutaneous  Q24H  . hydrALAZINE  100 mg Oral TID  . insulin aspart  0-15 Units Subcutaneous TID WC  . insulin glargine  15 Units Subcutaneous QHS  . isosorbide mononitrate  120 mg Oral Daily  . pantoprazole  40 mg Oral Daily  . ranolazine  500 mg Oral BID  . rosuvastatin  10 mg Oral Daily  . sodium chloride flush  3 mL Intravenous Q12H  . sodium chloride flush  3 mL Intravenous Q12H  . ticagrelor  90 mg Oral BID    Infusions: . sodium chloride    . sodium chloride      PRN Medications: sodium chloride, sodium chloride, acetaminophen, albuterol, alum & mag hydroxide-simeth, morphine injection, nitroGLYCERIN, ondansetron (ZOFRAN) IV, sodium chloride flush, sodium chloride flush    Patient Profile   Brad Mendoza is a 63 y.o. male with a past medical history of systolic HF due to NICM, HTN, HLD, DM and CAD.   Admitted from cath lab 03/19/18 with volume overload as below. Cath showed 90% RCA stenosis (worsened) and underwent PCI/DES to RCA by Dr. Ellyn Hack.   Assessment/Plan   1. Acute on chronic systolic CHF: Mixed ICM and NICM. He had preexisting NICM diagnosed in 2005 when his EF was 35% and had normal cors. Echo in 05/2016 with EF 30-35% and cath showed 70-75% stenosis in the proximal and mid LAD,90% ostial stenosis in the 1st diagonal, and 60-70% stenosis in the mid RCA. [place] 05/2016-  Fick C.O. 5.86/index 2.74 - Echo from 4/19 reviewed personally EF read as 40-45% but it is more like 25% - Echo 03/12/17 EF 30-35% - Off milrinone. ReDs vest 33% 03/26/2018. Weight up 1 lb.  - Diuretics held 03/26/2018 with AKI. Cr slightly improved today.  - Will need to increase torsemide on d/c. With advanced cardiorenal syndrome, worried that prognosis is not great.  - He was on torsemide 40 mg daily PTA. Will discuss regimen for home with team. - Continue coreg 6.25 mg BID.  - Continue imdur 120 mg daily.   - Continue hydralazine 100 mg TID Arlyce Harman on hold with AKI.  Delene Loll has been on hold with mild  AKI.  - Will eventually need ICD if EF remains < 35% post PCI in 3 months    2. CAD - s/p PCI/DES of RCA 03/19/2018. Other CAD stable. Manage medically  - No further CP.  - Continue isosorbide as above.  - Continue Ranexa. - Continue DAPT and statin  3. ETOH abuse - Congratulated him on cessation. No change.   4. Tobacco abuse - Chews tobacco.  - Encouraged complete cessation. No change.   5. HTN - SBP 130-150s - Meds as above.   6. CKD stage III -  Baseline creatinine 1.7-2.2.  - Follows with Dr. Hinda Lenis - Creatinine 2.3 -> 2.6 -> 2.5. Lasix as above.   7. DM - He has been off Jardiance. Would like to restart as outpatient.  - Cover with SSI. No change.   8. Fatigue - Pending sleep study 03/28/2018. Suspect he has severe OSA with pulmonary HTN. No change.   9. NSVT - K 4.3. Mg 2.2   Length of Stay: [address]  Shirley Friar, [place]  03/27/2018, 9:25 AM  Advanced Heart Failure Team Pager [phone] (M-F; 7a - 4p)  Please contact [facility] for night-coverage after hours (4p -7a ) and weekends on [email or web address]  Patient seen and examined with the above-signed Advanced Practice Provider and/or Housestaff. I personally reviewed laboratory data, imaging studies and relevant notes. I independently examined the patient and formulated the important aspects of the plan. I have edited the note to reflect any of my changes or salient points. I have personally discussed the plan with the patient and/or family.  Much improved. [place] for d/c today. He has severe cardiorenal syndrome so volume management likely to be an ongoing issue. Agree with plan as above.   Glori Bickers, MD  11:55 AM

## 2018-03-27 NOTE — Progress Notes (Signed)
Transitions of Care Follow Up Call Note  Brad Mendoza is an 63 y.o. male who presented to [facility] on 03/19/2018.  The patient had the following prescriptions filled at [facility]: Bril[facility]   Patient was called by pharmacist and HIPAA identifiers were verified. The following questions were asked about the prescriptions filled at [facility]:  Has the patient been experiencing any side effects to the medications prescribed? no Understanding of regimen: good Understanding of indications: good Potential of compliance: good  Pharmacist comments: Patient reports no complaints. Has follow up scheduled 2/14.   [x]  Patient's prescriptions filled at the [facility] Transitions [facility] Care Pharmacy were transferred to the following pharmacy: [facility]  []  Patient unable to be reached after calling three times and prescriptions filled at the [facility] Transitions of Care Pharmacy were transferred to preferred pharmacy found within their chart.   [name] [name] 03/27/2018, 5:27 PM Transitions of Care Pharmacy Hours: Monday - Friday 8:30am to 5:00 PM  Phone - [phone]

## 2018-04-02 ENCOUNTER — Other Ambulatory Visit: Payer: Self-pay

## 2018-04-02 NOTE — Patient Outreach (Signed)
[facility]) Care Management  04/02/2018   Brad Mendoza [birth year] [id number]  Subjective: Successful outreach to the patient for assessment.  HIPAA verified.  The patient was in the hospital two weeks ago for stent placement.  He states that he is doing well.  He has home health and they are coming out two times a week. He states that he has his scales fixed and is weighing daily.  His weight today was 207 lbs.   He denies any chest pain, shortness of breath, swelling or falls.  He has had some changes to his medications and he is taking as prescribed.  He states that he checked his blood sugar and it was 201 this morning.  Discussed about lowering his numbers with diet.  The patient verbalized understanding.  The patient states that he has an appointment this month on the 14th to follow up on his CHF.  Current Medications:  Current Outpatient Medications  Medication Sig Dispense Refill  . albuterol (PROVENTIL HFA;VENTOLIN HFA) 108 (90 Base) MCG/ACT inhaler Inhale 2 puffs into the lungs every 6 (six) hours as needed for wheezing or shortness of breath. 1 Inhaler 2  . aspirin EC 81 MG tablet Take 81 mg by mouth daily.    . Capsaicin (ICY HOT ARTHRITIS THERAPY EX) Apply 1 application topically 2 (two) times daily as needed (back pain).     . carvedilol (COREG) 6.25 MG tablet Take 1 tablet (6.25 mg total) by mouth 2 (two) times daily with a meal. 60 tablet 5  . Cholecalciferol (VITAMIN D3) 25 MCG (1000 UT) CAPS Take 2,000 Units by mouth daily.    . fluticasone (FLONASE) 50 MCG/ACT nasal spray USE 2 SPRAYS IN EACH NOSTRIL DAILY (Patient taking differently: Place 1 spray into both nostrils daily as needed for rhinitis. ) 48 g 1  . hydrALAZINE (APRESOLINE) 100 MG tablet Take 1 tablet (100 mg total) by mouth 3 (three) times daily. 90 tablet 5  . isosorbide mononitrate (IMDUR) 120 MG 24 hr tablet Take 1 tablet (120 mg total) by mouth daily. 30 tablet 5  . metFORMIN (GLUCOPHAGE-XR) 500  MG 24 hr tablet Take 1 tablet (500 mg total) by mouth daily with breakfast. 90 tablet 0  . metolazone (ZAROXOLYN) 5 MG tablet Take 1 tablet (5 mg total) by mouth 2 (two) times a week. Take on Mondays and Fridays 10 tablet 5  . Multiple Vitamins-Minerals (CENTRUM SILVER ADULT 50+ PO) Take 1 tablet by mouth daily.    . nitroGLYCERIN (NITROSTAT) 0.4 MG SL tablet DISSOLVE 1 TAB UNDER TOUNGE FOR CHEST PAIN. MAY REPEAT EVERY 5 MINUTES FOR 3 DOSES. IF NO RELIEF CALL 911 OR GO TO ER (Patient taking differently: Place 0.4 mg under the tongue every 5 (five) minutes as needed for chest pain. ) 25 tablet 2  . Omega-3 Fatty Acids (FISH OIL) 1000 MG CPDR Take 1,000 mg by mouth daily.     . pantoprazole (PROTONIX) 40 MG tablet TAKE ONE (1) TABLET EACH DAY (Patient taking differently: Take 40 mg by mouth daily. ) 90 tablet 1  . ranolazine (RANEXA) 500 MG 12 hr tablet TAKE ONE TABLET BY MOUTH TWICE DAILY (Patient taking differently: Take 500 mg by mouth 2 (two) times daily. ) 60 tablet 5  . rosuvastatin (CRESTOR) 10 MG tablet TAKE ONE (1) TABLET EACH DAY (Patient taking differently: Take 10 mg by mouth daily. ) 90 tablet 1  . ticagrelor (BRILINTA) 90 MG TABS tablet Take 1 tablet (90 mg total)  by mouth 2 (two) times daily. 60 tablet 5  . torsemide (DEMADEX) 20 MG tablet Take 3 tablets (60 mg total) by mouth daily. 90 tablet 5  . empagliflozin (JARDIANCE) 10 MG TABS tablet Take 10 mg by mouth daily. (Patient not taking: Reported on 03/14/2018) 30 tablet 6  . loratadine-pseudoephedrine (CLARITIN-D 12 HOUR) 5-120 MG tablet Take 1 tablet by mouth 2 (two) times daily. (Patient not taking: Reported on 03/14/2018) 20 tablet 0  . terbinafine (LAMISIL) 1 % cream Apply 1 application topically 2 (two) times daily. Apply to both feet and between toes (Patient not taking: Reported on 03/14/2018) 30 g 1  . TOUJEO SOLOSTAR 300 UNIT/ML SOPN Inject 40 Units into the skin every morning AND 15 Units every evening. (Patient taking differently:  Inject 40 units SQ in the morning and inject 15 units SQ at bedtime) 4.5 mL 0   Current Facility-Administered Medications  Medication Dose Route Frequency Provider Last Rate Last Dose  . aflibercept (EYLEA) SOLN 2 mg  2 mg Intravitreal  [name], MD   2 mg at 10/20/17 0858  . aflibercept (EYLEA) SOLN 2 mg  2 mg Intravitreal  [name], MD   2 mg at 11/21/17 1214  . aflibercept (EYLEA) SOLN 2 mg  2 mg Intravitreal  [name], MD   2 mg at 12/21/17 0933  . Bevacizumab (AVASTIN) SOLN 1.25 mg  1.25 mg Intravitreal  [name], MD   1.25 mg at 07/19/17 0849  . Bevacizumab (AVASTIN) SOLN 1.25 mg  1.25 mg Intravitreal  [name], MD   1.25 mg at 08/16/17 1232  . Bevacizumab (AVASTIN) SOLN 1.25 mg  1.25 mg Intravitreal  [name], MD   1.25 mg at 09/18/17 1024    Functional Status:  In your present state of health, do you have any difficulty performing the following activities: 03/20/2018 03/20/2018  Hearing? - N  Vision? - N  Difficulty concentrating or making decisions? - N  Walking or climbing stairs? - N  Dressing or bathing? - N  Doing errands, shopping? N -  Some recent data might be hidden    Fall/Depression Screening: Fall Risk  04/02/2018 03/02/2018 01/25/2018  Falls in the past year? 0 0 0  Comment - - -  Number falls in past yr: - - -  Injury with Fall? - - -  Comment - - -  Risk for fall due to : - - -  Follow up - - -   PHQ 2/9 Scores 01/25/2018 12/14/2016 12/02/2016 09/26/2016 09/14/2016 09/01/2016 08/22/2016  PHQ - 2 Score 0 1 0 0 0 0 0    Assessment: Patient will continue to benefit from health coach outreach for disease management and support.  THN CM Care Plan Problem One     Most Recent Value  THN Long Term Goal   In 30 days the patient will verbalize that he has watched his diet to lower his a1c and staying out of the hospital for chf  [facility] Long Term Goal Start Date  04/02/18  Interventions for Problem One Long Term Goal  Talked with the patient about  food and fluid intake, weighing daily, signs and symptoms of CHF and medication adherence.       Plan:  RN Health Coach will contact patient in the month of March and patient agrees to next outreach.   [name] RN, BSN, [place] Direct Dial:  [phone]  Fax: [phone]

## 2018-04-05 ENCOUNTER — Encounter (INDEPENDENT_AMBULATORY_CARE_PROVIDER_SITE_OTHER): Payer: Self-pay | Admitting: Ophthalmology

## 2018-04-09 ENCOUNTER — Other Ambulatory Visit: Payer: Self-pay | Admitting: Pediatrics

## 2018-04-09 ENCOUNTER — Encounter (HOSPITAL_COMMUNITY): Payer: Medicare Other

## 2018-04-09 DIAGNOSIS — Z794 Long term (current) use of insulin: Principal | ICD-10-CM

## 2018-04-09 DIAGNOSIS — E118 Type 2 diabetes mellitus with unspecified complications: Secondary | ICD-10-CM

## 2018-04-10 ENCOUNTER — Encounter: Payer: Self-pay | Admitting: Family Medicine

## 2018-04-10 ENCOUNTER — Ambulatory Visit (INDEPENDENT_AMBULATORY_CARE_PROVIDER_SITE_OTHER): Payer: Medicare Other | Admitting: Family Medicine

## 2018-04-10 ENCOUNTER — Other Ambulatory Visit: Payer: Self-pay | Admitting: Family Medicine

## 2018-04-10 VITALS — BP 126/87 | HR 77 | Temp 97.4°F | Ht 69.0 in | Wt 211.6 lb

## 2018-04-10 DIAGNOSIS — B353 Tinea pedis: Secondary | ICD-10-CM | POA: Diagnosis not present

## 2018-04-10 DIAGNOSIS — E118 Type 2 diabetes mellitus with unspecified complications: Secondary | ICD-10-CM

## 2018-04-10 DIAGNOSIS — I10 Essential (primary) hypertension: Secondary | ICD-10-CM

## 2018-04-10 DIAGNOSIS — E7800 Pure hypercholesterolemia, unspecified: Secondary | ICD-10-CM

## 2018-04-10 DIAGNOSIS — Z794 Long term (current) use of insulin: Secondary | ICD-10-CM

## 2018-04-10 DIAGNOSIS — K219 Gastro-esophageal reflux disease without esophagitis: Secondary | ICD-10-CM

## 2018-04-10 LAB — BAYER DCA HB A1C WAIVED: HB A1C (BAYER DCA - WAIVED): 12.7 % — ABNORMAL HIGH (ref <7.0)

## 2018-04-10 MED ORDER — METFORMIN HCL ER 500 MG PO TB24: 500.0000 mg | ORAL_TABLET | Freq: Every day | ORAL | 0 refills | Status: DC

## 2018-04-10 MED ORDER — PANTOPRAZOLE SODIUM 40 MG PO TBEC: 40.0000 mg | DELAYED_RELEASE_TABLET | Freq: Every day | ORAL | 1 refills | Status: DC

## 2018-04-10 MED ORDER — METOLAZONE 5 MG PO TABS: 5.0000 mg | ORAL_TABLET | ORAL | 5 refills | Status: DC

## 2018-04-10 MED ORDER — ROSUVASTATIN CALCIUM 10 MG PO TABS: 10.0000 mg | ORAL_TABLET | Freq: Every day | ORAL | 1 refills | Status: DC

## 2018-04-10 MED ORDER — EMPAGLIFLOZIN 10 MG PO TABS: 10.0000 mg | ORAL_TABLET | Freq: Every day | ORAL | 6 refills | Status: DC

## 2018-04-10 MED ORDER — TERBINAFINE HCL 1 % EX CREA: 1.0000 "application " | TOPICAL_CREAM | Freq: Two times a day (BID) | CUTANEOUS | 1 refills | Status: AC

## 2018-04-10 NOTE — Progress Notes (Signed)
Subjective:  Patient ID: Brad Mendoza,  male    DOB: 1955-08-04  Age: 63 y.o.    CC: Hospitalization Follow-up   HPI Brad Mendoza presents for  follow-up of hospitalization for recent RCA  stent placement for CAD. (Record reviewed.) He was also noted to have acute on chronic CHF, systolic. Echo showed LVEF 30-35 with diffuse hypokinesis. Severe dilation of left atrium.   Pt. Denies any current symptoms.   Patient also  in for follow-up of elevated cholesterol. Doing well without complaints on current medication. Denies side effects  including myalgia and arthralgia and nausea. Also in today for liver function testing. Currently no chest pain, shortness of breath or other cardiovascular related symptoms noted.  Follow-up of diabetes. Patient does check blood sugar at home. Readings runaround 200.  Patient denies symptoms such as excessive hunger or urinary frequency, excessive hunger, nausea No significant hypoglycemic spells noted. Medications reviewed. Pt reports taking them regularly. Pt. denies complication/adverse reaction today.    History Brad Mendoza has a past medical history of Acid reflux, Back pain, CAD (coronary artery disease), CHF (congestive heart failure) ([place]), Diabetes mellitus (2005), Diverticulosis, H/O chest pain (2005), HTN (hypertension), Hyperlipidemia, Nonischemic cardiomyopathy ([place]), Onychomycosis, and Polysubstance abuse ([place]).   He has a past surgical history that includes Appendectomy; Colonoscopy (N/A, 08/01/2013); Cardiac catheterization (2005); Esophagogastroduodenoscopy (N/A, 04/23/2015); RIGHT/LEFT HEART CATH AND CORONARY ANGIOGRAPHY (N/A, 06/03/2016); RIGHT/LEFT HEART CATH AND CORONARY ANGIOGRAPHY (N/A, 03/19/2018); and CORONARY STENT INTERVENTION (N/A, 03/19/2018).   His family history includes Alcohol abuse in his brother; Cancer in his mother; Diabetes in his father; Hypertension in his father.He reports that he has never smoked. His smokeless tobacco use  includes snuff. He reports current alcohol use of about 1.0 standard drinks of alcohol per week. He reports current drug use. Drug: Marijuana.  Current Outpatient Medications on File Prior to Visit  Medication Sig Dispense Refill  . albuterol (PROVENTIL HFA;VENTOLIN HFA) 108 (90 Base) MCG/ACT inhaler Inhale 2 puffs into the lungs every 6 (six) hours as needed for wheezing or shortness of breath. 1 Inhaler 2  . aspirin EC 81 MG tablet Take 81 mg by mouth daily.    . Capsaicin (ICY HOT ARTHRITIS THERAPY EX) Apply 1 application topically 2 (two) times daily as needed (back pain).     . carvedilol (COREG) 6.25 MG tablet Take 1 tablet (6.25 mg total) by mouth 2 (two) times daily with a meal. 60 tablet 5  . Cholecalciferol (VITAMIN D3) 25 MCG (1000 UT) CAPS Take 2,000 Units by mouth daily.    . fluticasone (FLONASE) 50 MCG/ACT nasal spray USE 2 SPRAYS IN EACH NOSTRIL DAILY (Patient taking differently: Place 1 spray into both nostrils daily as needed for rhinitis. ) 48 g 1  . hydrALAZINE (APRESOLINE) 100 MG tablet Take 1 tablet (100 mg total) by mouth 3 (three) times daily. 90 tablet 5  . isosorbide mononitrate (IMDUR) 120 MG 24 hr tablet Take 1 tablet (120 mg total) by mouth daily. 30 tablet 5  . loratadine-pseudoephedrine (CLARITIN-D 12 HOUR) 5-120 MG tablet Take 1 tablet by mouth 2 (two) times daily. 20 tablet 0  . Multiple Vitamins-Minerals (CENTRUM SILVER ADULT 50+ PO) Take 1 tablet by mouth daily.    . nitroGLYCERIN (NITROSTAT) 0.4 MG SL tablet DISSOLVE 1 TAB UNDER TOUNGE FOR CHEST PAIN. MAY REPEAT EVERY 5 MINUTES FOR 3 DOSES. IF NO RELIEF CALL 911 OR GO TO ER (Patient taking differently: Place 0.4 mg under the tongue every 5 (five) minutes  as needed for chest pain. ) 25 tablet 2  . Omega-3 Fatty Acids (FISH OIL) 1000 MG CPDR Take 1,000 mg by mouth daily.     . ranolazine (RANEXA) 500 MG 12 hr tablet TAKE ONE TABLET BY MOUTH TWICE DAILY (Patient taking differently: Take 500 mg by mouth 2 (two) times  daily. ) 60 tablet 5  . ticagrelor (BRILINTA) 90 MG TABS tablet Take 1 tablet (90 mg total) by mouth 2 (two) times daily. 60 tablet 5  . torsemide (DEMADEX) 20 MG tablet Take 3 tablets (60 mg total) by mouth daily. 90 tablet 5  . TOUJEO SOLOSTAR 300 UNIT/ML SOPN INJECT 40 UNITS SQ EVERY MORNING AND INJECT 15 UNITS SQ EVERY EVENING 4.5 mL 0   Current Facility-Administered Medications on File Prior to Visit  Medication Dose Route Frequency Provider Last Rate Last Dose  . aflibercept (EYLEA) SOLN 2 mg  2 mg Intravitreal  [name], MD   2 mg at 10/20/17 0858  . aflibercept (EYLEA) SOLN 2 mg  2 mg Intravitreal  [name], MD   2 mg at 11/21/17 1214  . aflibercept (EYLEA) SOLN 2 mg  2 mg Intravitreal  [name], MD   2 mg at 12/21/17 0933  . Bevacizumab (AVASTIN) SOLN 1.25 mg  1.25 mg Intravitreal  [name], MD   1.25 mg at 07/19/17 0849  . Bevacizumab (AVASTIN) SOLN 1.25 mg  1.25 mg Intravitreal  [name], MD   1.25 mg at 08/16/17 1232  . Bevacizumab (AVASTIN) SOLN 1.25 mg  1.25 mg Intravitreal  [name], MD   1.25 mg at 09/18/17 1024    ROS Review of Systems  Constitutional: Negative.   HENT: Negative.   Eyes: Negative for visual disturbance.  Respiratory: Negative for cough and shortness of breath.   Cardiovascular: Negative for chest pain and leg swelling.  Gastrointestinal: Negative for abdominal pain, diarrhea, nausea and vomiting.  Genitourinary: Negative for difficulty urinating.  Musculoskeletal: Negative for arthralgias and myalgias.  Skin: Negative for rash.  Neurological: Negative for headaches.  Psychiatric/Behavioral: Negative for sleep disturbance.    Objective:  BP 126/87   Pulse 77   Temp (!) 97.4 F (36.3 C) (Oral)   Ht 5' 9"  (1.753 m)   Wt 211 lb 9.6 oz (96 kg)   BMI 31.25 kg/m   BP Readings from Last 3 Encounters:  04/10/18 126/87  03/27/18 130/89  03/12/18 (!) 160/80    Wt Readings from Last 3 Encounters:  04/10/18 211 lb 9.6  oz (96 kg)  03/27/18 206 lb 1.6 oz (93.5 kg)  03/12/18 235 lb 6.4 oz (106.8 kg)     Physical Exam Constitutional:      General: He is not in acute distress.    Appearance: He is well-developed.  HENT:     Head: Normocephalic and atraumatic.     Right Ear: External ear normal.     Left Ear: External ear normal.     Nose: Nose normal.  Eyes:     Conjunctiva/sclera: Conjunctivae normal.     Pupils: Pupils are equal, round, and reactive to light.  Neck:     Musculoskeletal: Normal range of motion and neck supple.  Cardiovascular:     Rate and Rhythm: Normal rate and regular rhythm.     Heart sounds: Normal heart sounds. No murmur.  Pulmonary:     Effort: Pulmonary effort is normal. No respiratory distress.     Breath sounds: Normal breath sounds. No wheezing or rales.  Abdominal:     Palpations: Abdomen is soft.     Tenderness: There is no abdominal tenderness.  Musculoskeletal: Normal range of motion.  Skin:    General: Skin is warm and dry.  Neurological:     Mental Status: He is alert and oriented to person, place, and time.     Deep Tendon Reflexes: Reflexes are normal and symmetric.  Psychiatric:        Behavior: Behavior normal.        Thought Content: Thought content normal.        Judgment: Judgment normal.    Results for orders placed or performed in visit on 04/10/18  Bayer DCA Hb A1c Waived  Result Value Ref Range   HB A1C (BAYER DCA - WAIVED) 12.7 (H) <7.0 %       Assessment & Plan:   Brad Mendoza was seen today for hospitalization follow-up.  Diagnoses and all orders for this visit:  Type 2 diabetes mellitus with complication, with long-term current use of insulin (HCC) -     Brain natriuretic peptide -     CBC with Differential/Platelet -     CMP14+EGFR -     Lipid panel -     Bayer DCA Hb A1c Waived -     metFORMIN (GLUCOPHAGE-XR) 500 MG 24 hr tablet; Take 1 tablet (500 mg total) by mouth daily with breakfast.  Pure hypercholesterolemia -      Brain natriuretic peptide -     CBC with Differential/Platelet -     CMP14+EGFR -     Lipid panel -     Bayer DCA Hb A1c Waived -     rosuvastatin (CRESTOR) 10 MG tablet; Take 1 tablet (10 mg total) by mouth daily.  Essential hypertension -     Brain natriuretic peptide -     CBC with Differential/Platelet -     CMP14+EGFR -     Lipid panel -     Bayer DCA Hb A1c Waived  Tinea pedis of both feet -     terbinafine (LAMISIL) 1 % cream; Apply 1 application topically 2 (two) times daily. Apply to both feet and between toes  Gastroesophageal reflux disease, esophagitis presence not specified -     pantoprazole (PROTONIX) 40 MG tablet; Take 1 tablet (40 mg total) by mouth daily.  Other orders -     metolazone (ZAROXOLYN) 5 MG tablet; Take 1 tablet (5 mg total) by mouth 2 (two) times a week. Take on Mondays and Fridays -     empagliflozin (JARDIANCE) 10 MG TABS tablet; Take 10 mg by mouth daily.   I have changed Brad Mendoza's rosuvastatin and pantoprazole. I am also having him maintain his aspirin EC, Multiple Vitamins-Minerals (CENTRUM SILVER ADULT 50+ PO), Capsaicin (ICY HOT ARTHRITIS THERAPY EX), Fish Oil, nitroGLYCERIN, ranolazine, loratadine-pseudoephedrine, albuterol, fluticasone, Vitamin D3, ticagrelor, carvedilol, hydrALAZINE, isosorbide mononitrate, torsemide, TOUJEO SOLOSTAR, terbinafine, metolazone, metFORMIN, and empagliflozin. We will continue to administer Bevacizumab, Bevacizumab, Bevacizumab, aflibercept, aflibercept, and aflibercept.  Meds ordered this encounter  Medications  . terbinafine (LAMISIL) 1 % cream    Sig: Apply 1 application topically 2 (two) times daily. Apply to both feet and between toes    Dispense:  30 g    Refill:  1  . rosuvastatin (CRESTOR) 10 MG tablet    Sig: Take 1 tablet (10 mg total) by mouth daily.    Dispense:  90 tablet    Refill:  1  . pantoprazole (PROTONIX) 40 MG  tablet    Sig: Take 1 tablet (40 mg total) by mouth daily.     Dispense:  90 tablet    Refill:  1  . metolazone (ZAROXOLYN) 5 MG tablet    Sig: Take 1 tablet (5 mg total) by mouth 2 (two) times a week. Take on Mondays and Fridays    Dispense:  10 tablet    Refill:  5  . metFORMIN (GLUCOPHAGE-XR) 500 MG 24 hr tablet    Sig: Take 1 tablet (500 mg total) by mouth daily with breakfast.    Dispense:  90 tablet    Refill:  0  . empagliflozin (JARDIANCE) 10 MG TABS tablet    Sig: Take 10 mg by mouth daily.    Dispense:  30 tablet    Refill:  6   Pt. With extremely high A1c. Need to verify use of toujeo. Pt. Also to be followed in CHF clinic. Monitor Glucose AC & HS  Follow-up: Return in about 1 month (around 05/09/2018).  [name], M.D.

## 2018-04-11 ENCOUNTER — Telehealth: Payer: Self-pay | Admitting: *Deleted

## 2018-04-11 LAB — CBC WITH DIFFERENTIAL/PLATELET
Basophils Absolute: 0.1 10*3/uL (ref 0.0–0.2)
Basos: 1 %
EOS (ABSOLUTE): 0.3 10*3/uL (ref 0.0–0.4)
Eos: 4 %
Hematocrit: 36 % — ABNORMAL LOW (ref 37.5–51.0)
Hemoglobin: 11.4 g/dL — ABNORMAL LOW (ref 13.0–17.7)
IMMATURE GRANULOCYTES: 1 %
Immature Grans (Abs): 0.1 10*3/uL (ref 0.0–0.1)
Lymphocytes Absolute: 1.4 10*3/uL (ref 0.7–3.1)
Lymphs: 16 %
MCH: 26.6 pg (ref 26.6–33.0)
MCHC: 31.7 g/dL (ref 31.5–35.7)
MCV: 84 fL (ref 79–97)
Monocytes Absolute: 0.8 10*3/uL (ref 0.1–0.9)
Monocytes: 9 %
Neutrophils Absolute: 6.1 10*3/uL (ref 1.4–7.0)
Neutrophils: 69 %
Platelets: 318 10*3/uL (ref 150–450)
RBC: 4.28 x10E6/uL (ref 4.14–5.80)
RDW: 13.3 % (ref 11.6–15.4)
WBC: 8.7 10*3/uL (ref 3.4–10.8)

## 2018-04-11 LAB — CMP14+EGFR
ALBUMIN: 3.6 g/dL — AB (ref 3.8–4.8)
ALT: 28 IU/L (ref 0–44)
AST: 23 IU/L (ref 0–40)
Albumin/Globulin Ratio: 1.4 (ref 1.2–2.2)
Alkaline Phosphatase: 98 IU/L (ref 39–117)
BUN/Creatinine Ratio: 31 — ABNORMAL HIGH (ref 10–24)
BUN: 69 mg/dL — ABNORMAL HIGH (ref 8–27)
Bilirubin Total: <0.2 mg/dL (ref 0.0–1.2)
CO2: 22 mmol/L (ref 20–29)
Calcium: 9.3 mg/dL (ref 8.6–10.2)
Chloride: 94 mmol/L — ABNORMAL LOW (ref 96–106)
Creatinine, Ser: 2.21 mg/dL — ABNORMAL HIGH (ref 0.76–1.27)
GFR calc Af Amer: 36 mL/min/{1.73_m2} — ABNORMAL LOW (ref >59)
GFR, EST NON AFRICAN AMERICAN: 31 mL/min/{1.73_m2} — AB (ref >59)
Globulin, Total: 2.6 g/dL (ref 1.5–4.5)
Glucose: 465 mg/dL (ref 65–99)
Potassium: 5 mmol/L (ref 3.5–5.2)
Sodium: 135 mmol/L (ref 134–144)
Total Protein: 6.2 g/dL (ref 6.0–8.5)

## 2018-04-11 LAB — LIPID PANEL
CHOL/HDL RATIO: 6.6 ratio — AB (ref 0.0–5.0)
Cholesterol, Total: 290 mg/dL — ABNORMAL HIGH (ref 100–199)
HDL: 44 mg/dL (ref >39)
LDL Calculated: 200 mg/dL — ABNORMAL HIGH (ref 0–99)
Triglycerides: 229 mg/dL — ABNORMAL HIGH (ref 0–149)
VLDL Cholesterol Cal: 46 mg/dL — ABNORMAL HIGH (ref 5–40)

## 2018-04-11 LAB — BRAIN NATRIURETIC PEPTIDE: BNP: 133.3 pg/mL — ABNORMAL HIGH (ref 0.0–100.0)

## 2018-04-11 NOTE — Telephone Encounter (Signed)
Patient  Returned phone call.  BS reading is 455 this morning and [facility] doctor there.  Dr. Denton Meek.  Patient will go pick up needs as soon as dr leaves.  1 week f/u made

## 2018-04-11 NOTE — Telephone Encounter (Signed)
Called patient with elevated BS of 465 per Dr. Livia Snellen.  Patient has not started toujeo but will start this morning after he picks up needles.  I told patient to call back with BS readings once he checks this morning

## 2018-04-13 ENCOUNTER — Telehealth: Payer: Self-pay | Admitting: Internal Medicine

## 2018-04-13 NOTE — Telephone Encounter (Signed)
Patient called regarding medicine causing him dizziness in recent days.  Reviewed meds and appears ranexa is a new medication for him.  Discussed this as possible side effect and he will stop and will check bp and <90 will go to ER. He is to call primary cardiologist on mon.   Kobina A. Shirlee Latch, MD, MSc, [facility]

## 2018-04-15 NOTE — Telephone Encounter (Signed)
Please check in on him on Monday. thanks

## 2018-04-16 NOTE — Telephone Encounter (Signed)
Called patient, pt reports BP this morning 130/70, feeling weak and tired. Pt reports dizziness improved however still present. Pt due to see primary doctor tomorrow and f/u in Heart failure clinic on 2/27.  Spoke with Dr. Haroldine Laws, as patient with home care, advised to get a vest reading on patient.  Order faxed to [facility] and community message sent.  Will update when results return.

## 2018-04-16 NOTE — Telephone Encounter (Signed)
Rx for vest reading faxed to [facility] as patient no longer with [facility]

## 2018-04-17 ENCOUNTER — Encounter (INDEPENDENT_AMBULATORY_CARE_PROVIDER_SITE_OTHER): Payer: Self-pay | Admitting: Ophthalmology

## 2018-04-17 ENCOUNTER — Telehealth: Payer: Self-pay | Admitting: Family Medicine

## 2018-04-17 ENCOUNTER — Ambulatory Visit: Payer: Medicare Other | Admitting: Family Medicine

## 2018-04-17 ENCOUNTER — Telehealth (HOSPITAL_COMMUNITY): Payer: Self-pay

## 2018-04-17 NOTE — Telephone Encounter (Signed)
(  see previous note 04/13/18-MD [place] communication)   Spoke with [facility], to check if patient was seen today. They state patient had a Drs appointment today and he called them to reschedule to tomorrow. it. He is due to be seen tomorrow with a reds vest pending.  I will follow up tomorrow to get value.

## 2018-04-17 NOTE — Telephone Encounter (Signed)
Pt scheduled with Dr Livia Snellen 04/27/18 and pt states his blood sugars are running high, FBS ranging from 300-400. Pt advised to monitor and to call back if it gets any higher or he becomes dizzy/light headed, visual disturbances and pt voiced understanding.

## 2018-04-18 ENCOUNTER — Encounter (HOSPITAL_COMMUNITY): Payer: Self-pay

## 2018-04-18 ENCOUNTER — Telehealth: Payer: Self-pay | Admitting: *Deleted

## 2018-04-18 ENCOUNTER — Telehealth: Payer: Self-pay | Admitting: Family Medicine

## 2018-04-18 ENCOUNTER — Emergency Department (HOSPITAL_COMMUNITY)
Admission: EM | Admit: 2018-04-18 | Discharge: 2018-04-18 | Disposition: A | Discharge status: Home / Self Care | Payer: Medicare Other | Attending: Emergency Medicine | Admitting: Emergency Medicine

## 2018-04-18 ENCOUNTER — Other Ambulatory Visit: Payer: Self-pay

## 2018-04-18 ENCOUNTER — Other Ambulatory Visit: Payer: Self-pay | Admitting: Pediatrics

## 2018-04-18 DIAGNOSIS — I2510 Atherosclerotic heart disease of native coronary artery without angina pectoris: Secondary | ICD-10-CM | POA: Diagnosis not present

## 2018-04-18 DIAGNOSIS — I5042 Chronic combined systolic (congestive) and diastolic (congestive) heart failure: Secondary | ICD-10-CM | POA: Insufficient documentation

## 2018-04-18 DIAGNOSIS — E1142 Type 2 diabetes mellitus with diabetic polyneuropathy: Secondary | ICD-10-CM | POA: Diagnosis not present

## 2018-04-18 DIAGNOSIS — Z7982 Long term (current) use of aspirin: Secondary | ICD-10-CM | POA: Diagnosis not present

## 2018-04-18 DIAGNOSIS — F17220 Nicotine dependence, chewing tobacco, uncomplicated: Secondary | ICD-10-CM | POA: Insufficient documentation

## 2018-04-18 DIAGNOSIS — I110 Hypertensive heart disease with heart failure: Secondary | ICD-10-CM | POA: Insufficient documentation

## 2018-04-18 DIAGNOSIS — Z794 Long term (current) use of insulin: Secondary | ICD-10-CM | POA: Insufficient documentation

## 2018-04-18 DIAGNOSIS — Z79899 Other long term (current) drug therapy: Secondary | ICD-10-CM | POA: Insufficient documentation

## 2018-04-18 DIAGNOSIS — E1165 Type 2 diabetes mellitus with hyperglycemia: Secondary | ICD-10-CM | POA: Insufficient documentation

## 2018-04-18 DIAGNOSIS — Z880 Allergy status to penicillin: Secondary | ICD-10-CM | POA: Diagnosis not present

## 2018-04-18 DIAGNOSIS — R739 Hyperglycemia, unspecified: Secondary | ICD-10-CM

## 2018-04-18 LAB — CBC
HCT: 35.2 % — ABNORMAL LOW (ref 39.0–52.0)
Hemoglobin: 11.2 g/dL — ABNORMAL LOW (ref 13.0–17.0)
MCH: 25.9 pg — ABNORMAL LOW (ref 26.0–34.0)
MCHC: 31.8 g/dL (ref 30.0–36.0)
MCV: 81.3 fL (ref 80.0–100.0)
Platelets: 214 10*3/uL (ref 150–400)
RBC: 4.33 MIL/uL (ref 4.22–5.81)
RDW: 13.4 % (ref 11.5–15.5)
WBC: 8.2 10*3/uL (ref 4.0–10.5)
nRBC: 0 % (ref 0.0–0.2)

## 2018-04-18 LAB — URINALYSIS, ROUTINE W REFLEX MICROSCOPIC
Bacteria, UA: NONE SEEN
Bilirubin Urine: NEGATIVE
Glucose, UA: >=500 mg/dL — AB
Hgb urine dipstick: NEGATIVE
Ketones, ur: NEGATIVE mg/dL
LEUKOCYTE UA: NEGATIVE
NITRITE: NEGATIVE
PROTEIN: 30 mg/dL — AB
Specific Gravity, Urine: 1.008 (ref 1.005–1.030)
pH: 5 (ref 5.0–8.0)

## 2018-04-18 LAB — BASIC METABOLIC PANEL
Anion gap: 13 (ref 5–15)
BUN: 85 mg/dL — ABNORMAL HIGH (ref 8–23)
CO2: 25 mmol/L (ref 22–32)
Calcium: 8.6 mg/dL — ABNORMAL LOW (ref 8.9–10.3)
Chloride: 89 mmol/L — ABNORMAL LOW (ref 98–111)
Creatinine, Ser: 2.8 mg/dL — ABNORMAL HIGH (ref 0.61–1.24)
GFR calc Af Amer: 27 mL/min — ABNORMAL LOW (ref >60)
GFR calc non Af Amer: 23 mL/min — ABNORMAL LOW (ref >60)
GLUCOSE: 460 mg/dL — AB (ref 70–99)
Potassium: 3.7 mmol/L (ref 3.5–5.1)
Sodium: 127 mmol/L — ABNORMAL LOW (ref 135–145)

## 2018-04-18 LAB — CBG MONITORING, ED
Glucose-Capillary: 449 mg/dL — ABNORMAL HIGH (ref 70–99)
Glucose-Capillary: 308 mg/dL — ABNORMAL HIGH (ref 70–99)

## 2018-04-18 MED ORDER — SODIUM CHLORIDE 0.9 % IV BOLUS
1000.0000 mL | Freq: Once | INTRAVENOUS | Status: AC
  Administered 2018-04-18: 1000 mL via INTRAVENOUS

## 2018-04-18 NOTE — Telephone Encounter (Signed)
TC from [place] w/ [facility] She is visiting with patient now His BS was 457 this morning He has taken his Metformin, Jardiance and Toujeo about 30 mins ago, BS is now [facility] nurse to have patient go to the ED, she agreed to inform patient.

## 2018-04-18 NOTE — ED Triage Notes (Signed)
Pt presents to ED with complaints of hyperglycemia. Pt states glucose has been running 400-500 the past couple of days. Pt states he is not behind on any of his diabetes medication. Pt states he has had dry mouth the last couple of days.

## 2018-04-18 NOTE — ED Provider Notes (Signed)
[facility] EMERGENCY DEPARTMENT Provider Note   CSN: [id number] Arrival date & time: 04/18/18  1512    History   Chief Complaint Chief Complaint  Patient presents with  . Hyperglycemia    HPI Brad Mendoza is a 63 y.o. male.     Known diabetic presents with concerns of elevated glucose.  His glucose has been running in the 400-500 range the past few days.  He states he has been taking his normal doses of diabetes medicines, but he does not know exactly what his doses are.  He did not bring his medications with him.  He feels weak and tired, but no fever, sweats, chills, chest pain, dyspnea.  He has a primary care doctor in [place].     Past Medical History:  Diagnosis Date  . Acid reflux   . Back pain    f4 and f5  . CAD (coronary artery disease)   . CHF (congestive heart failure) ([place])    last echo was in March 2016  . Diabetes mellitus 2005  . Diverticulosis   . H/O chest pain 2005   ECHO  . HTN (hypertension)   . Hyperlipidemia   . Nonischemic cardiomyopathy ([place])   . Onychomycosis   . Polysubstance abuse [name])     Patient Active Problem List   Diagnosis Date Noted  . Acute on chronic systolic (congestive) heart failure ([place]) 03/19/2018  . 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 Anginal pain ([place]) 06/01/2016  . CHF exacerbation ([place]) 11/17/2015  . Chronic combined systolic and diastolic heart failure ([place]) 11/16/2015  . Type 2 diabetes mellitus with hyperosmolar nonketotic hyperglycemia ([place]) 05/08/2015  . Hyponatremia 05/08/2015  . Hypochloremia 05/08/2015  . Type 2 diabetes mellitus with diabetic polyneuropathy ([place]) 12/16/2013  . Coronary artery disease involving native coronary artery of native heart with angina pectoris ([place]) 09/18/2013  . Heme positive stool 06/12/2013  . Seasonal allergic rhinitis 07/11/2012  . Polysubstance abuse ([place]) 12/24/2010  . Onychomycosis   . Acid reflux   . Nonischemic cardiomyopathy ([place])   . Type 2 diabetes mellitus with  complications ([place])   . Hyperlipemia 11/16/2009  . Obesity 11/16/2009  . Tobacco dependence 11/16/2009  . Essential hypertension 11/16/2009    Past Surgical History:  Procedure Laterality Date  . APPENDECTOMY    . CARDIAC CATHETERIZATION  2005   4 frech cath  . COLONOSCOPY N/A 08/01/2013   Procedure: COLONOSCOPY;  Surgeon: Rogene Houston, MD;  [facility] ENDO [facility];  Service: Endoscopy;  Laterality: N/A;  rescheduled to [place] notified pt  . CORONARY STENT INTERVENTION N/A 03/19/2018   Procedure: CORONARY STENT INTERVENTION;  Surgeon: Leonie Man, MD;  Location: [place] CV LAB;  Service: Cardiovascular;  Laterality: N/A;  . ESOPHAGOGASTRODUODENOSCOPY N/A 04/23/2015   Procedure: ESOPHAGOGASTRODUODENOSCOPY (EGD);  Surgeon: Rogene Houston, MD;  Location: [facility] ENDO [facility];  Service: Endoscopy;  Laterality: N/A;  1:25  . RIGHT/LEFT HEART CATH AND CORONARY ANGIOGRAPHY N/A 06/03/2016   Procedure: Right/Left Heart Cath and Coronary Angiography;  Surgeon: Belva Crome, MD;  Location: [place] CV LAB;  Service: Cardiovascular;  Laterality: N/A;  . RIGHT/LEFT HEART CATH AND CORONARY ANGIOGRAPHY N/A 03/19/2018   Procedure: RIGHT/LEFT HEART CATH AND CORONARY ANGIOGRAPHY;  Surgeon: Jolaine Artist, MD;  Location: [place] CV LAB;  Service: Cardiovascular;  Laterality: N/A;        Home Medications    Prior to Admission medications   Medication Sig Start Date End Date Taking? Authorizing Provider  albuterol (  PROVENTIL HFA;VENTOLIN HFA) 108 (90 Base) MCG/ACT inhaler Inhale 2 puffs into the lungs every 6 (six) hours as needed for wheezing or shortness of breath. 01/25/18  Yes Hawks, Christy A, FNP  aspirin EC 81 MG tablet Take 81 mg by mouth daily.   Yes [provider]  Capsaicin (ICY HOT ARTHRITIS THERAPY EX) Apply 1 application topically 2 (two) times daily as needed (back pain).    Yes [provider]  carvedilol (COREG) 6.25 MG tablet Take 1 tablet (6.25 mg  total) by mouth 2 (two) times daily with a meal. 03/27/18  Yes Georgiana Shore, NP  Cholecalciferol (VITAMIN D3) 25 MCG (1000 UT) CAPS Take 2,000 Units by mouth daily.   Yes [provider]  empagliflozin (JARDIANCE) 10 MG TABS tablet Take 10 mg by mouth daily. 04/10/18  Yes Stacks, Cletus Gash, MD  fluticasone (FLONASE) 50 MCG/ACT nasal spray USE 2 SPRAYS IN EACH NOSTRIL DAILY Patient taking differently: Place 1 spray into both nostrils daily as needed for allergies or rhinitis.  02/07/18  Yes [name], MD  hydrALAZINE (APRESOLINE) 100 MG tablet Take 1 tablet (100 mg total) by mouth 3 (three) times daily. 03/27/18  Yes Georgiana Shore, NP  isosorbide mononitrate (IMDUR) 120 MG 24 hr tablet Take 1 tablet (120 mg total) by mouth daily. 03/28/18  Yes Georgiana Shore, NP  metFORMIN (GLUCOPHAGE-XR) 500 MG 24 hr tablet Take 1 tablet (500 mg total) by mouth daily with breakfast. 04/10/18  Yes [name], MD  metolazone (ZAROXOLYN) 5 MG tablet Take 1 tablet (5 mg total) by mouth 2 (two) times a week. Take on Mondays and Fridays 04/12/18  Yes Stacks, Cletus Gash, MD  Multiple Vitamins-Minerals (CENTRUM SILVER ADULT 50+ PO) Take 1 tablet by mouth daily.   Yes [provider]  nitroGLYCERIN (NITROSTAT) 0.4 MG SL tablet DISSOLVE 1 TAB UNDER TOUNGE FOR CHEST PAIN. MAY REPEAT EVERY 5 MINUTES FOR 3 DOSES. IF NO RELIEF CALL 911 OR GO TO ER Patient taking differently: Place 0.4 mg under the tongue every 5 (five) minutes as needed for chest pain.  12/10/15  Yes [name], MD  Omega-3 Fatty Acids (FISH OIL) 1000 MG CPDR Take 1,000 mg by mouth daily.    Yes [provider]  pantoprazole (PROTONIX) 40 MG tablet Take 1 tablet (40 mg total) by mouth daily. 04/10/18  Yes Stacks, Cletus Gash, MD  ranolazine (RANEXA) 500 MG 12 hr tablet TAKE ONE TABLET BY MOUTH TWICE DAILY Patient taking differently: Take 500 mg by mouth 2 (two) times daily.  11/20/17  Yes Bensimhon, Shaune Pascal, MD  rosuvastatin (CRESTOR)  10 MG tablet Take 1 tablet (10 mg total) by mouth daily. 04/10/18  Yes Stacks, Cletus Gash, MD  terbinafine (LAMISIL) 1 % cream Apply 1 application topically 2 (two) times daily. Apply to both feet and between toes 04/10/18  Yes Stacks, Cletus Gash, MD  ticagrelor (BRILINTA) 90 MG TABS tablet Take 1 tablet (90 mg total) by mouth 2 (two) times daily. 03/23/18 05/22/18 Yes Clegg, Amy D, NP  torsemide (DEMADEX) 20 MG tablet Take 3 tablets (60 mg total) by mouth daily. 03/27/18  Yes Georgiana Shore, NP  TOUJEO SOLOSTAR 300 UNIT/ML SOPN INJECT 40 UNITS SQ EVERY MORNING AND INJECT 15 UNITS SQ EVERY EVENING Patient taking differently: Inject 15-40 Units into the skin See admin instructions. 40 units in the morning and 15 units in the evening 04/11/18  Yes [name], MD    Family History Family History  Problem Relation  Age of Onset  . Hypertension Father   . Diabetes Father   . Cancer Mother   . Alcohol abuse Brother   . Colon cancer Neg Hx     Social History Social History   Tobacco Use  . Smoking status: Never Smoker  . Smokeless tobacco: Current User    Types: Snuff  . Tobacco comment: dips snuff about 6 cans/week for 3 years  Substance Use Topics  . Alcohol use: Yes    Alcohol/week: 1.0 standard drinks    Types: 1 Cans of beer per week    Comment: occ - has cut down, now once every 2-3 months  . Drug use: Yes    Types: Marijuana    Comment: occasional use - smoking less     Allergies   Sulfa antibiotics and Penicillins   Review of Systems Review of Systems  All other systems reviewed and are negative.    Physical Exam Updated Vital Signs BP 140/88 (BP Location: Right Arm)   Pulse 79   Temp 98.3 F (36.8 C) (Oral)   Resp 20   Ht 5\' 9"  (1.753 m)   Wt 88.9 kg   SpO2 99%   BMI 28.94 kg/m   Physical Exam Vitals signs and nursing note reviewed.  Constitutional:      Appearance: He is well-developed.  HENT:     Head: Normocephalic and atraumatic.  Eyes:      Conjunctiva/sclera: Conjunctivae normal.  Neck:     Musculoskeletal: Neck supple.  Cardiovascular:     Rate and Rhythm: Normal rate and regular rhythm.  Pulmonary:     Effort: Pulmonary effort is normal.     Breath sounds: Normal breath sounds.  Abdominal:     General: Bowel sounds are normal.     Palpations: Abdomen is soft.  Musculoskeletal: Normal range of motion.  Skin:    General: Skin is warm and dry.  Neurological:     Mental Status: He is alert and oriented to person, place, and time.  Psychiatric:        Behavior: Behavior normal.      ED Treatments / Results  Labs (all labs ordered are listed, but only abnormal results are displayed) Labs Reviewed  BASIC METABOLIC PANEL - Abnormal; Notable for the following components:      Result Value   Sodium 127 (*)    Chloride 89 (*)    Glucose, Bld 460 (*)    BUN 85 (*)    Creatinine, Ser 2.80 (*)    Calcium 8.6 (*)    GFR calc non Af Amer 23 (*)    GFR calc Af Amer 27 (*)    All other components within normal limits  CBC - Abnormal; Notable for the following components:   Hemoglobin 11.2 (*)    HCT 35.2 (*)    MCH 25.9 (*)    All other components within normal limits  URINALYSIS, ROUTINE W REFLEX MICROSCOPIC - Abnormal; Notable for the following components:   Color, Urine STRAW (*)    Glucose, UA >=500 (*)    Protein, ur 30 (*)    All other components within normal limits  CBG MONITORING, ED - Abnormal; Notable for the following components:   Glucose-Capillary 449 (*)    All other components within normal limits  CBG MONITORING, ED - Abnormal; Notable for the following components:   Glucose-Capillary 308 (*)    All other components within normal limits  CBG MONITORING, ED    EKG  None  Radiology No results found.  Procedures Procedures (including critical care time)  Medications Ordered in ED Medications  sodium chloride 0.9 % bolus 1,000 mL (0 mLs Intravenous Stopped 04/18/18 2041)     Initial  Impression / Assessment and Plan / ED Course  I have reviewed the triage vital signs and the nursing notes.  Pertinent labs & imaging results that were available during my care of the patient were reviewed by me and considered in my medical decision making (see chart for details).   Patient is a known diabetic who is hyperglycemic.  I am uncertain to his exact medication regimen.  Glucose has reduced significantly with IV fluids only.  I recommended he increase the morning dose of Toujeo Solostar from 40 to 45 units.  He will follow-up with his primary care doctor.       Final Clinical Impressions(s) / ED Diagnoses   Final diagnoses:  Hyperglycemia    ED Discharge Orders    None       Nat Christen, MD 04/18/18 2239

## 2018-04-18 NOTE — Discharge Instructions (Addendum)
Recommend increasing your Toujeo Solostar to 45 units in the morning.  Follow-up with your regular doctor.

## 2018-04-19 ENCOUNTER — Ambulatory Visit (HOSPITAL_COMMUNITY)
Admission: RE | Admit: 2018-04-19 | Discharge: 2018-04-19 | Disposition: A | Discharge status: Home / Self Care | Payer: Medicare Other | Source: Ambulatory Visit | Attending: Cardiology | Admitting: Cardiology

## 2018-04-19 ENCOUNTER — Inpatient Hospital Stay (HOSPITAL_COMMUNITY): Payer: Medicare Other

## 2018-04-19 DIAGNOSIS — I428 Other cardiomyopathies: Secondary | ICD-10-CM | POA: Insufficient documentation

## 2018-04-19 LAB — BASIC METABOLIC PANEL
Anion gap: 11 (ref 5–15)
BUN: 80 mg/dL — ABNORMAL HIGH (ref 8–23)
CO2: 27 mmol/L (ref 22–32)
Calcium: 8.7 mg/dL — ABNORMAL LOW (ref 8.9–10.3)
Chloride: 96 mmol/L — ABNORMAL LOW (ref 98–111)
Creatinine, Ser: 2.79 mg/dL — ABNORMAL HIGH (ref 0.61–1.24)
GFR calc Af Amer: 27 mL/min — ABNORMAL LOW (ref >60)
GFR calc non Af Amer: 23 mL/min — ABNORMAL LOW (ref >60)
GLUCOSE: 266 mg/dL — AB (ref 70–99)
Potassium: 3.7 mmol/L (ref 3.5–5.1)
Sodium: 134 mmol/L — ABNORMAL LOW (ref 135–145)

## 2018-04-19 NOTE — Telephone Encounter (Signed)
Vest reading 29% on 2/26 per [facility] RN

## 2018-04-21 NOTE — Progress Notes (Signed)
[facility] Note  04/23/2018     CHIEF COMPLAINT Patient presents for Retina Follow Up   HISTORY OF PRESENT ILLNESS: Brad Mendoza is a 63 y.o. male who presents to the clinic today for:   HPI    Retina Follow Up    Patient presents with  Diabetic Retinopathy.  In both eyes.  This started 4 months ago.  Severity is moderate.  Duration of 4 months.  Since onset it is stable.  I, the attending physician,  performed the HPI with the patient and updated documentation appropriately.          Comments    Patient here for 4 month  retina follow up for NPDR OU. Patient states vision doing alright. Can't tell any difference. No eye pain.l.       Last edited by [name], MD on 04/23/2018  2:59 PM. (History)     pt states he recently had a stent put in his heart bc of a blockage he has had for years, he states he was having tightness in his chest and shortness of breath,   Referring physician: [name], MD [place], [place] [address]  HISTORICAL INFORMATION:   Selected notes from the MEDICAL RECORD NUMBER Referred by Dr. [name] for concern of CSME OS;  LEE- 04.04.19 ([name]) [BCVA OD: 20/20 OS: 20/40] Ocular Hx-  PMH- DM, CAD, CHF, HTN, hyperlipidemia    CURRENT MEDICATIONS: No current outpatient medications on file. (Ophthalmic Drugs)   Current Facility-Administered Medications (Ophthalmic Drugs)  Medication Route  . aflibercept (EYLEA) SOLN 2 mg Intravitreal  . aflibercept (EYLEA) SOLN 2 mg Intravitreal  . aflibercept (EYLEA) SOLN 2 mg Intravitreal  . aflibercept (EYLEA) SOLN 2 mg Intravitreal   Current Outpatient Medications (Other)  Medication Sig  . albuterol (PROVENTIL HFA;VENTOLIN HFA) 108 (90 Base) MCG/ACT inhaler Inhale 2 puffs into the lungs every 6 (six) hours as needed for wheezing or shortness of breath.  [name] aspirin EC 81 MG tablet Take 81 mg by mouth daily.  . Capsaicin (ICY HOT ARTHRITIS THERAPY EX) Apply 1  application topically 2 (two) times daily as needed (back pain).   . carvedilol (COREG) 6.25 MG tablet Take 1 tablet (6.25 mg total) by mouth 2 (two) times daily with a meal.  . Cholecalciferol (VITAMIN D3) 25 MCG (1000 UT) CAPS Take 2,000 Units by mouth daily.  . empagliflozin (JARDIANCE) 10 MG TABS tablet Take 10 mg by mouth daily.  . fluticasone (FLONASE) 50 MCG/ACT nasal spray USE 2 SPRAYS IN EACH NOSTRIL DAILY (Patient taking differently: Place 1 spray into both nostrils daily as needed for allergies or rhinitis. )  . hydrALAZINE (APRESOLINE) 100 MG tablet Take 1 tablet (100 mg total) by mouth 3 (three) times daily.  . isosorbide mononitrate (IMDUR) 120 MG 24 hr tablet Take 1 tablet (120 mg total) by mouth daily.  . metFORMIN (GLUCOPHAGE-XR) 500 MG 24 hr tablet Take 1 tablet (500 mg total) by mouth daily with breakfast.  . metolazone (ZAROXOLYN) 5 MG tablet Take 1 tablet (5 mg total) by mouth 2 (two) times a week. Take on Mondays and Fridays  . Multiple Vitamins-Minerals (CENTRUM SILVER ADULT 50+ PO) Take 1 tablet by mouth daily.  . nitroGLYCERIN (NITROSTAT) 0.4 MG SL tablet DISSOLVE 1 TAB UNDER TOUNGE FOR CHEST PAIN. MAY REPEAT EVERY 5 MINUTES FOR 3 DOSES. IF NO RELIEF CALL 911 OR GO TO ER (Patient taking differently: Place 0.4 mg  under the tongue every 5 (five) minutes as needed for chest pain. )  . Omega-3 Fatty Acids (FISH OIL) 1000 MG CPDR Take 1,000 mg by mouth daily.   [name] VERIO test strip USE FOUR TIMES DAILY AS NEEDED  . pantoprazole (PROTONIX) 40 MG tablet Take 1 tablet (40 mg total) by mouth daily.  . ranolazine (RANEXA) 500 MG 12 hr tablet TAKE ONE TABLET BY MOUTH TWICE DAILY (Patient taking differently: Take 500 mg by mouth 2 (two) times daily. )  . rosuvastatin (CRESTOR) 10 MG tablet Take 1 tablet (10 mg total) by mouth daily.  [name] terbinafine (LAMISIL) 1 % cream Apply 1 application topically 2 (two) times daily. Apply to both feet and between toes  . ticagrelor (BRILINTA) 90  MG TABS tablet Take 1 tablet (90 mg total) by mouth 2 (two) times daily.  [name] torsemide (DEMADEX) 20 MG tablet Take 3 tablets (60 mg total) by mouth daily.  . TOUJEO SOLOSTAR 300 UNIT/ML SOPN INJECT 40 UNITS SQ EVERY MORNING AND INJECT 15 UNITS SQ EVERY EVENING (Patient taking differently: Inject 15-40 Units into the skin See admin instructions. 40 units in the morning and 15 units in the evening)   Current Facility-Administered Medications (Other)  Medication Route  . Bevacizumab (AVASTIN) SOLN 1.25 mg Intravitreal  . Bevacizumab (AVASTIN) SOLN 1.25 mg Intravitreal  . Bevacizumab (AVASTIN) SOLN 1.25 mg Intravitreal      REVIEW OF SYSTEMS: ROS    Positive for: Endocrine, Cardiovascular, Eyes   Negative for: Constitutional, Gastrointestinal, Neurological, Skin, Genitourinary, Musculoskeletal, HENT, Respiratory, Psychiatric, Allergic/Imm, Heme/Lymph   Last edited by Theodore Demark on 04/23/2018  2:01 PM. (History)       ALLERGIES Allergies  Allergen Reactions  . Sulfa Antibiotics Shortness Of Breath  . Penicillins Rash and Other (See Comments)    Has patient had a PCN reaction causing immediate rash, facial/tongue/throat swelling, SOB or lightheadedness with hypotension: No Has patient had a PCN reaction causing severe rash involving mucus membranes or skin necrosis: No Has patient had a PCN reaction that required hospitalization No Has patient had a PCN reaction occurring within the last 10 years: No If all of the above answers are "NO", then may proceed with Cephalosporin use.     PAST MEDICAL HISTORY Past Medical History:  Diagnosis Date  . Acid reflux   . Back pain    f4 and f5  . CAD (coronary artery disease)   . CHF (congestive heart failure) ([place])    last echo was in March 2016  . Diabetes mellitus 2005  . Diverticulosis   . H/O chest pain 2005   ECHO  . HTN (hypertension)   . Hyperlipidemia   . Nonischemic cardiomyopathy ([place])   . Onychomycosis   .  Polysubstance abuse [facility])    Past Surgical History:  Procedure Laterality Date  . APPENDECTOMY    . CARDIAC CATHETERIZATION  2005   4 frech cath  . COLONOSCOPY N/A 08/01/2013   Procedure: COLONOSCOPY;  Surgeon: Rogene Houston, MD;  [facility];  Service: Endoscopy;  Laterality: N/A;  rescheduled to [place] notified pt  . CORONARY STENT INTERVENTION N/A 03/19/2018   Procedure: CORONARY STENT INTERVENTION;  Surgeon: Leonie Man, MD;  Location: [place] [facility];  Service: Cardiovascular;  Laterality: N/A;  . ESOPHAGOGASTRODUODENOSCOPY N/A 04/23/2015   Procedure: ESOPHAGOGASTRODUODENOSCOPY (EGD);  Surgeon: Rogene Houston, MD;  [facility];  Service: Endoscopy;  Laterality: N/A;  1:25  . RIGHT/LEFT HEART  CATH AND CORONARY ANGIOGRAPHY N/A 06/03/2016   Procedure: Right/Left Heart Cath and Coronary Angiography;  Surgeon: Belva Crome, MD;  Location: [place];  Service: Cardiovascular;  Laterality: N/A;  . RIGHT/LEFT HEART CATH AND CORONARY ANGIOGRAPHY N/A 03/19/2018   Procedure: RIGHT/LEFT HEART CATH AND CORONARY ANGIOGRAPHY;  Surgeon: Jolaine Artist, MD;  Location: [facility];  Service: Cardiovascular;  Laterality: N/A;    FAMILY HISTORY Family History  Problem Relation Age of Onset  . Hypertension Father   . Diabetes Father   . Cancer Mother   . Alcohol abuse Brother   . Colon cancer Neg Hx     SOCIAL HISTORY Social History   Tobacco Use  . Smoking status: Never Smoker  . Smokeless tobacco: Current User    Types: Snuff  . Tobacco comment: dips snuff about 6 cans/week for 3 years  Substance Use Topics  . Alcohol use: Yes    Alcohol/week: 1.0 standard drinks    Types: 1 Cans of beer per week    Comment: occ - has cut down, now once every 2-3 months  . Drug use: Yes    Types: Marijuana    Comment: occasional use - smoking less         OPHTHALMIC EXAM:  Base Eye Exam    Visual Acuity (Snellen - Linear)      Right Left    Dist cc 20/25 -2 20/30 -2   Dist ph cc  NI   Correction:  Glasses       Tonometry (Tonopen, 1:58 PM)      Right Left   Pressure 16 12       Pupils      Dark Light Shape React APD   Right 4 3 Round Brisk None   Left 4 3 Round Brisk None       Visual [place] (Counting fingers)      Left Right    Full Full       Extraocular Movement      Right Left    Full, Ortho Full, Ortho       Neuro/Psych    Oriented x3:  Yes   Mood/Affect:  Normal       Dilation    Both eyes:  1.0% Mydriacyl, 2.5% Phenylephrine @ 1:58 PM        Slit Lamp and Fundus Exam    Slit Lamp Exam      Right Left   Lids/Lashes Dermatochalasis - upper lid Dermatochalasis - upper lid   Conjunctiva/Sclera Melanosis Melanosis   Cornea Arcus, trace inferior and central Punctate epithelial erosions Arcus, trace inferior and central Punctate epithelial erosions, round k scar inferonasal para-central   Anterior Chamber Deep and quiet Deep and quiet   Iris Round and dilated, No NVI Round and dilated, No NVI   Lens 2+ Nuclear sclerosis, 2+ Cortical cataract, Vacuoles 2+ Nuclear sclerosis, 2+ Cortical cataract, Vacuoles   Vitreous Vitreous syneresis, prominent vitreous base nasally  Vitreous syneresis, prominent vitreous base nasally       Fundus Exam      Right Left   Disc Normal Normal   C/D Ratio 0.4 0.4   Macula blunted foveal reflex, scattered Microaneurysms, mild cystic changes, RPE mottling Blunted foveal reflex, +persistent edema - mild interval improvement, scattered MAs, scattered DBH, scattered exudates -- improved   Vessels Mild Vascular attenuation Mild Vascular attenuation   Periphery Attached, scattered MAs, few CWS posteriorly -- improved, scattered White without pressure Attached,  scattered DBH, scattered CWS -- improved, scattered White without pressure        Refraction    Wearing Rx      Sphere Cylinder Axis Add   Right -0.25 +1.00 019 +2.50   Left -0.25 +1.00 153 +2.50           IMAGING AND PROCEDURES  Imaging and Procedures for 05/31/17  OCT, Retina - OU - Both Eyes       Right Eye Quality was good. Central Foveal Thickness: 281. Progression has worsened. Findings include normal foveal contour, no SRF, intraretinal fluid (Interval increase in IRF).   Left Eye Quality was good. Central Foveal Thickness: 257. Progression has improved. Findings include normal foveal contour, intraretinal fluid, no SRF (Interval improvement in IRF).   Notes *Images captured and stored on drive  Diagnosis / Impression:  DME OU, OS>OD Interval increase in IRF OD Mild interval improvement in noncentral IRF OS  Clinical management:  See below  Abbreviations: NFP - Normal foveal profile. CME - cystoid macular edema. PED - pigment epithelial detachment. IRF - intraretinal fluid. SRF - subretinal fluid. EZ - ellipsoid zone. ERM - epiretinal membrane. ORA - outer retinal atrophy. ORT - outer retinal tubulation. SRHM - subretinal hyper-reflective material         Intravitreal Injection, Pharmacologic Agent - OS - Left Eye       Time Out 04/23/2018. 3:00 PM. Confirmed correct patient, procedure, site, and patient consented.   Anesthesia Topical anesthesia was used. Anesthetic medications included Lidocaine 2%, Proparacaine 0.5%.   Procedure Preparation included 5% betadine to ocular surface, eyelid speculum. A 30 gauge needle was used.   Injection:  2 mg aflibercept Alfonse Flavors) SOLN   NDC: [id number], Lot: [id number], Expiration date: 10/21/2018   Route: Intravitreal, Site: Left Eye, Waste: 0.05 mL  Post-op Post injection exam found visual acuity of at least counting fingers. The patient tolerated the procedure well. There were no complications. The patient received written and verbal post procedure care education.                 ASSESSMENT/PLAN:    ICD-10-CM   1. Moderate nonproliferative diabetic retinopathy of both eyes with macular edema associated with  type 2 diabetes mellitus (HCC) P95.0932 Intravitreal Injection, Pharmacologic Agent - OS - Left Eye    aflibercept (EYLEA) SOLN 2 mg  2. Retinal edema H35.81 OCT, Retina - OU - Both Eyes  3. Essential hypertension I10   4. Hypertensive retinopathy of both eyes H35.033   5. Combined forms of age-related cataract of both eyes H25.813     1,2. Moderate to severe non-proliferative diabetic retinopathy, OU - initial exam showed scattered IRH, exudates, and cotton wool spots, OS > OD, no NV - FA 4.10.19 shows no NV OU - S/P IVA OS #1 (05.29.19), #2 (06.26.19), #3 (07.29.19) - S/P IVE OS #1 (08.30.19), #2 (10.01.19), #3 (10.31.19) - OCT shows interval improvement in diabetic macular edema OS -- good response to Eylea - BCVA improved to 20/30-2 from 20/50 OS - recommend IVE #4 OS today (03.02.20) - pt wishes to proceed - RBA of procedure discussed, questions answered - informed consent obtained and signed - see procedure note - [email or web address] paper work and benefits investigation started on 07.29.19 -- Approved for Good Days for 2020 - f/u in 4 weeks -- DFE; OCT; possible injection  3,4. Hypertensive retinopathy OU - discussed importance of tight BP control - monitor  5. Combined form age-related cataract OU-  - The symptoms  of cataract, surgical options, and treatments and risks were discussed with patient. - discussed diagnosis and progression - not yet visually significant - monitor for now   Ophthalmic Meds Ordered this visit:  Meds ordered this encounter  Medications  . aflibercept (EYLEA) SOLN 2 mg       Return in about 4 weeks (around 05/21/2018) for f/u NPDR OU, DFE, OCT.  There are no Patient Instructions on file for this visit.   Explained the diagnoses, plan, and follow up with the patient and they expressed understanding.  Patient expressed understanding of the importance of proper follow up care.   This document serves as a record of services personally performed by  Gardiner Sleeper, MD, PhD. It was created on their behalf by Ernest Mallick, OA, an ophthalmic assistant. The creation of this record is the provider's dictation and/or activities during the visit.    Electronically signed by: Ernest Mallick, OA 02.29.2020 12:30 AM    Gardiner Sleeper, M.D., Ph.D. Diseases & Surgery of the Retina and Vitreous [facility]  I have reviewed the above documentation for accuracy and completeness, and I agree with the above. Gardiner Sleeper, M.D., Ph.D. 04/25/18 12:32 AM    Abbreviations: M myopia (nearsighted); A astigmatism; H hyperopia (farsighted); P presbyopia; Mrx spectacle prescription;  CTL contact lenses; OD right eye; OS left eye; OU both eyes  XT exotropia; ET esotropia; PEK punctate epithelial keratitis; PEE punctate epithelial erosions; DES dry eye syndrome; MGD meibomian gland dysfunction; ATs artificial tears; PFAT's preservative free artificial tears; [place] nuclear sclerotic cataract; PSC posterior subcapsular cataract; ERM epi-retinal membrane; PVD posterior vitreous detachment; RD retinal detachment; DM diabetes mellitus; DR diabetic retinopathy; NPDR non-proliferative diabetic retinopathy; PDR proliferative diabetic retinopathy; CSME clinically significant macular edema; DME diabetic macular edema; dbh dot blot hemorrhages; CWS cotton wool spot; POAG primary open angle glaucoma; C/D cup-to-disc ratio; HVF humphrey visual field; GVF goldmann visual field; OCT optical coherence tomography; IOP intraocular pressure; BRVO Branch retinal vein occlusion; CRVO central retinal vein occlusion; CRAO central retinal artery occlusion; BRAO branch retinal artery occlusion; RT retinal tear; SB scleral buckle; PPV pars plana vitrectomy; VH Vitreous hemorrhage; PRP panretinal laser photocoagulation; IVK intravitreal kenalog; VMT vitreomacular traction; MH Macular hole;  NVD neovascularization of the disc; NVE neovascularization elsewhere; AREDS age related  eye disease study; ARMD age related macular degeneration; POAG primary open angle glaucoma; EBMD epithelial/anterior basement membrane dystrophy; ACIOL anterior chamber intraocular lens; IOL intraocular lens; PCIOL posterior chamber intraocular lens; Phaco/IOL phacoemulsification with intraocular lens placement; [place] photorefractive keratectomy; LASIK laser assisted in situ keratomileusis; HTN hypertension; DM diabetes mellitus; COPD chronic obstructive pulmonary disease

## 2018-04-23 ENCOUNTER — Encounter (INDEPENDENT_AMBULATORY_CARE_PROVIDER_SITE_OTHER): Payer: Self-pay | Admitting: Ophthalmology

## 2018-04-23 ENCOUNTER — Ambulatory Visit (INDEPENDENT_AMBULATORY_CARE_PROVIDER_SITE_OTHER): Payer: Medicare Other | Admitting: Ophthalmology

## 2018-04-23 DIAGNOSIS — Z794 Long term (current) use of insulin: Secondary | ICD-10-CM | POA: Diagnosis not present

## 2018-04-23 DIAGNOSIS — H35033 Hypertensive retinopathy, bilateral: Secondary | ICD-10-CM

## 2018-04-23 DIAGNOSIS — I255 Ischemic cardiomyopathy: Secondary | ICD-10-CM | POA: Diagnosis not present

## 2018-04-23 DIAGNOSIS — H3581 Retinal edema: Secondary | ICD-10-CM | POA: Diagnosis not present

## 2018-04-23 DIAGNOSIS — K219 Gastro-esophageal reflux disease without esophagitis: Secondary | ICD-10-CM | POA: Diagnosis not present

## 2018-04-23 DIAGNOSIS — N183 Chronic kidney disease, stage 3 (moderate): Secondary | ICD-10-CM | POA: Diagnosis not present

## 2018-04-23 DIAGNOSIS — E1122 Type 2 diabetes mellitus with diabetic chronic kidney disease: Secondary | ICD-10-CM | POA: Diagnosis not present

## 2018-04-23 DIAGNOSIS — E113313 Type 2 diabetes mellitus with moderate nonproliferative diabetic retinopathy with macular edema, bilateral: Secondary | ICD-10-CM | POA: Diagnosis not present

## 2018-04-23 DIAGNOSIS — I272 Pulmonary hypertension, unspecified: Secondary | ICD-10-CM | POA: Diagnosis not present

## 2018-04-23 DIAGNOSIS — I10 Essential (primary) hypertension: Secondary | ICD-10-CM

## 2018-04-23 DIAGNOSIS — R6889 Other general symptoms and signs: Secondary | ICD-10-CM | POA: Diagnosis not present

## 2018-04-23 DIAGNOSIS — I130 Hypertensive heart and chronic kidney disease with heart failure and stage 1 through stage 4 chronic kidney disease, or unspecified chronic kidney disease: Secondary | ICD-10-CM | POA: Diagnosis not present

## 2018-04-23 DIAGNOSIS — E785 Hyperlipidemia, unspecified: Secondary | ICD-10-CM | POA: Diagnosis not present

## 2018-04-23 DIAGNOSIS — Z9181 History of falling: Secondary | ICD-10-CM | POA: Diagnosis not present

## 2018-04-23 DIAGNOSIS — I5023 Acute on chronic systolic (congestive) heart failure: Secondary | ICD-10-CM | POA: Diagnosis not present

## 2018-04-23 DIAGNOSIS — H25813 Combined forms of age-related cataract, bilateral: Secondary | ICD-10-CM | POA: Diagnosis not present

## 2018-04-23 DIAGNOSIS — I2510 Atherosclerotic heart disease of native coronary artery without angina pectoris: Secondary | ICD-10-CM | POA: Diagnosis not present

## 2018-04-25 MED ORDER — AFLIBERCEPT 2MG/0.05ML IZ SOLN FOR KALEIDOSCOPE
2.0000 mg | INTRAVITREAL | Status: DC
  Administered 2018-04-25: 2 mg via INTRAVITREAL

## 2018-04-26 ENCOUNTER — Inpatient Hospital Stay (HOSPITAL_COMMUNITY): Payer: Medicare Other

## 2018-04-26 ENCOUNTER — Encounter: Payer: Self-pay | Admitting: Family Medicine

## 2018-04-26 DIAGNOSIS — I255 Ischemic cardiomyopathy: Secondary | ICD-10-CM | POA: Diagnosis not present

## 2018-04-26 DIAGNOSIS — E785 Hyperlipidemia, unspecified: Secondary | ICD-10-CM | POA: Diagnosis not present

## 2018-04-26 DIAGNOSIS — Z9181 History of falling: Secondary | ICD-10-CM | POA: Diagnosis not present

## 2018-04-26 DIAGNOSIS — I5023 Acute on chronic systolic (congestive) heart failure: Secondary | ICD-10-CM | POA: Diagnosis not present

## 2018-04-26 DIAGNOSIS — I272 Pulmonary hypertension, unspecified: Secondary | ICD-10-CM | POA: Diagnosis not present

## 2018-04-26 DIAGNOSIS — N183 Chronic kidney disease, stage 3 (moderate): Secondary | ICD-10-CM | POA: Diagnosis not present

## 2018-04-26 DIAGNOSIS — I130 Hypertensive heart and chronic kidney disease with heart failure and stage 1 through stage 4 chronic kidney disease, or unspecified chronic kidney disease: Secondary | ICD-10-CM | POA: Diagnosis not present

## 2018-04-26 DIAGNOSIS — E1122 Type 2 diabetes mellitus with diabetic chronic kidney disease: Secondary | ICD-10-CM | POA: Diagnosis not present

## 2018-04-26 DIAGNOSIS — I2510 Atherosclerotic heart disease of native coronary artery without angina pectoris: Secondary | ICD-10-CM | POA: Diagnosis not present

## 2018-04-26 DIAGNOSIS — Z794 Long term (current) use of insulin: Secondary | ICD-10-CM | POA: Diagnosis not present

## 2018-04-26 DIAGNOSIS — K219 Gastro-esophageal reflux disease without esophagitis: Secondary | ICD-10-CM | POA: Diagnosis not present

## 2018-04-27 ENCOUNTER — Encounter: Payer: Self-pay | Admitting: Family Medicine

## 2018-04-27 ENCOUNTER — Ambulatory Visit (INDEPENDENT_AMBULATORY_CARE_PROVIDER_SITE_OTHER): Payer: Medicare Other | Admitting: Family Medicine

## 2018-04-27 DIAGNOSIS — E118 Type 2 diabetes mellitus with unspecified complications: Secondary | ICD-10-CM | POA: Diagnosis not present

## 2018-04-27 DIAGNOSIS — Z794 Long term (current) use of insulin: Secondary | ICD-10-CM | POA: Diagnosis not present

## 2018-04-27 MED ORDER — INSULIN GLARGINE (2 UNIT DIAL) 300 UNIT/ML ~~LOC~~ SOPN: PEN_INJECTOR | SUBCUTANEOUS | 11 refills | Status: DC

## 2018-04-27 MED ORDER — NABUMETONE 500 MG PO TABS: 1000.0000 mg | ORAL_TABLET | Freq: Two times a day (BID) | ORAL | 1 refills | Status: DC

## 2018-04-27 MED ORDER — PREGABALIN 50 MG PO CAPS: ORAL_CAPSULE | ORAL | 1 refills | Status: DC

## 2018-04-27 MED ORDER — METFORMIN HCL ER 750 MG PO TB24: 750.0000 mg | ORAL_TABLET | Freq: Every day | ORAL | 1 refills | Status: DC

## 2018-04-27 NOTE — Patient Instructions (Signed)
Carbohydrate Counting for Diabetes Mellitus, Adult  Carbohydrate counting is a method of keeping track of how many carbohydrates you eat. Eating carbohydrates naturally increases the amount of sugar (glucose) in the blood. Counting how many carbohydrates you eat helps keep your blood glucose within normal limits, which helps you manage your diabetes (diabetes mellitus). It is important to know how many carbohydrates you can safely have in each meal. This is different for every person. A diet and nutrition specialist (registered dietitian) can help you make a meal plan and calculate how many carbohydrates you should have at each meal and snack. Carbohydrates are found in the following foods:  Grains, such as breads and cereals.  Dried beans and soy products.  Starchy vegetables, such as potatoes, peas, and corn.  Fruit and fruit juices.  Milk and yogurt.  Sweets and snack foods, such as cake, cookies, candy, chips, and soft drinks. How do I count carbohydrates? There are two ways to count carbohydrates in food. You can use either of the methods or a combination of both. Reading "Nutrition Facts" on packaged food The "Nutrition Facts" list is included on the labels of almost all packaged foods and beverages in the [place] It includes:  The serving size.  Information about nutrients in each serving, including the grams (g) of carbohydrate per serving. To use the "Nutrition Facts":  Decide how many servings you will have.  Multiply the number of servings by the number of carbohydrates per serving.  The resulting number is the total amount of carbohydrates that you will be having. Learning standard serving sizes of other foods When you eat carbohydrate foods that are not packaged or do not include "Nutrition Facts" on the label, you need to measure the servings in order to count the amount of carbohydrates:  Measure the foods that you will eat with a food scale or measuring cup, if needed.   Decide how many standard-size servings you will eat.  Multiply the number of servings by 15. Most carbohydrate-rich foods have about 15 g of carbohydrates per serving. ? For example, if you eat 8 oz (170 g) of strawberries, you will have eaten 2 servings and 30 g of carbohydrates (2 servings x 15 g = 30 g).  For foods that have more than one food mixed, such as soups and casseroles, you must count the carbohydrates in each food that is included. The following list contains standard serving sizes of common carbohydrate-rich foods. Each of these servings has about 15 g of carbohydrates:   hamburger bun or  English muffin.   oz (15 mL) syrup.   oz (14 g) jelly.  1 slice of bread.  1 six-inch tortilla.  3 oz (85 g) cooked rice or pasta.  4 oz (113 g) cooked dried beans.  4 oz (113 g) starchy vegetable, such as peas, corn, or potatoes.  4 oz (113 g) hot cereal.  4 oz (113 g) mashed potatoes or  of a large baked potato.  4 oz (113 g) canned or frozen fruit.  4 oz (120 mL) fruit juice.  4-6 crackers.  6 chicken nuggets.  6 oz (170 g) unsweetened dry cereal.  6 oz (170 g) plain fat-free yogurt or yogurt sweetened with artificial sweeteners.  8 oz (240 mL) milk.  8 oz (170 g) fresh fruit or one small piece of fruit.  24 oz (680 g) popped popcorn. Example of carbohydrate counting Sample meal  3 oz (85 g) chicken breast.  6 oz (170 g)   brown rice.  4 oz (113 g) corn.  8 oz (240 mL) milk.  8 oz (170 g) strawberries with sugar-free whipped topping. Carbohydrate calculation 1. Identify the foods that contain carbohydrates: ? Rice. ? Corn. ? Milk. ? Strawberries. 2. Calculate how many servings you have of each food: ? 2 servings rice. ? 1 serving corn. ? 1 serving milk. ? 1 serving strawberries. 3. Multiply each number of servings by 15 g: ? 2 servings rice x 15 g = 30 g. ? 1 serving corn x 15 g = 15 g. ? 1 serving milk x 15 g = 15 g. ? 1 serving  strawberries x 15 g = 15 g. 4. Add together all of the amounts to find the total grams of carbohydrates eaten: ? 30 g + 15 g + 15 g + 15 g = 75 g of carbohydrates total. Summary  Carbohydrate counting is a method of keeping track of how many carbohydrates you eat.  Eating carbohydrates naturally increases the amount of sugar (glucose) in the blood.  Counting how many carbohydrates you eat helps keep your blood glucose within normal limits, which helps you manage your diabetes.  A diet and nutrition specialist (registered dietitian) can help you make a meal plan and calculate how many carbohydrates you should have at each meal and snack. This information is not intended to replace advice given to you by your health care provider. Make sure you discuss any questions you have with your health care provider. Document Released: 02/07/2005 Document Revised: 08/17/2016 Document Reviewed: 07/22/2015 Elsevier Interactive Patient Education  2019 [employer]  

## 2018-04-27 NOTE — Progress Notes (Signed)
Subjective:  Patient ID: Brad Mendoza, male    DOB: Dec 28, 1955  Age: 63 y.o. MRN: [id number]  CC: Medical Management of Chronic Issues   HPI Brad Mendoza presents forFollow-up of diabetes. Patient checks blood sugar at home.  Was 255 this morning. 145 yesterday. Patient denies symptoms such as polyuria, polydipsia, excessive hunger, nausea No significant hypoglycemic spells noted. Taking toujeo 45 units every day. Also using jardiance daily. He has been haaving diarrhea with the metformin so he is taking it when his sugar goes up only.  History Brad Mendoza has a past medical history of Acid reflux, Back pain, CAD (coronary artery disease), CHF (congestive heart failure) ([place]), Diabetes mellitus (2005), Diverticulosis, H/O chest pain (2005), HTN (hypertension), Hyperlipidemia, Nonischemic cardiomyopathy ([place]), Onychomycosis, and Polysubstance abuse ([place]).   He has a past surgical history that includes Appendectomy; Colonoscopy (N/A, 08/01/2013); Cardiac catheterization (2005); Esophagogastroduodenoscopy (N/A, 04/23/2015); RIGHT/LEFT HEART CATH AND CORONARY ANGIOGRAPHY (N/A, 06/03/2016); RIGHT/LEFT HEART CATH AND CORONARY ANGIOGRAPHY (N/A, 03/19/2018); and CORONARY STENT INTERVENTION (N/A, 03/19/2018).   His family history includes Alcohol abuse in his brother; Cancer in his mother; Diabetes in his father; Hypertension in his father.He reports that he has never smoked. His smokeless tobacco use includes snuff. He reports current alcohol use of about 1.0 standard drinks of alcohol per week. He reports current drug use. Drug: Marijuana.  Current Outpatient Medications on File Prior to Visit  Medication Sig Dispense Refill  . albuterol (PROVENTIL HFA;VENTOLIN HFA) 108 (90 Base) MCG/ACT inhaler Inhale 2 puffs into the lungs every 6 (six) hours as needed for wheezing or shortness of breath. 1 Inhaler 2  . aspirin EC 81 MG tablet Take 81 mg by mouth daily.    . Capsaicin (ICY HOT ARTHRITIS THERAPY EX)  Apply 1 application topically 2 (two) times daily as needed (back pain).     . carvedilol (COREG) 6.25 MG tablet Take 1 tablet (6.25 mg total) by mouth 2 (two) times daily with a meal. 60 tablet 5  . Cholecalciferol (VITAMIN D3) 25 MCG (1000 UT) CAPS Take 2,000 Units by mouth daily.    . empagliflozin (JARDIANCE) 10 MG TABS tablet Take 10 mg by mouth daily. 30 tablet 6  . fluticasone (FLONASE) 50 MCG/ACT nasal spray USE 2 SPRAYS IN EACH NOSTRIL DAILY (Patient taking differently: Place 1 spray into both nostrils daily as needed for allergies or rhinitis. ) 48 g 1  . hydrALAZINE (APRESOLINE) 100 MG tablet Take 1 tablet (100 mg total) by mouth 3 (three) times daily. 90 tablet 5  . isosorbide mononitrate (IMDUR) 120 MG 24 hr tablet Take 1 tablet (120 mg total) by mouth daily. 30 tablet 5  . metolazone (ZAROXOLYN) 5 MG tablet Take 1 tablet (5 mg total) by mouth 2 (two) times a week. Take on Mondays and Fridays 10 tablet 5  . Multiple Vitamins-Minerals (CENTRUM SILVER ADULT 50+ PO) Take 1 tablet by mouth daily.    . nitroGLYCERIN (NITROSTAT) 0.4 MG SL tablet DISSOLVE 1 TAB UNDER TOUNGE FOR CHEST PAIN. MAY REPEAT EVERY 5 MINUTES FOR 3 DOSES. IF NO RELIEF CALL 911 OR GO TO ER (Patient taking differently: Place 0.4 mg under the tongue every 5 (five) minutes as needed for chest pain. ) 25 tablet 2  . Omega-3 Fatty Acids (FISH OIL) 1000 MG CPDR Take 1,000 mg by mouth daily.     Brad Mendoza VERIO test strip USE FOUR TIMES DAILY AS NEEDED 100 each 11  . pantoprazole (PROTONIX) 40 MG tablet Take 1  tablet (40 mg total) by mouth daily. 90 tablet 1  . ranolazine (RANEXA) 500 MG 12 hr tablet TAKE ONE TABLET BY MOUTH TWICE DAILY (Patient taking differently: Take 500 mg by mouth 2 (two) times daily. ) 60 tablet 5  . rosuvastatin (CRESTOR) 10 MG tablet Take 1 tablet (10 mg total) by mouth daily. 90 tablet 1  . terbinafine (LAMISIL) 1 % cream Apply 1 application topically 2 (two) times daily. Apply to both feet and between  toes 30 g 1  . ticagrelor (BRILINTA) 90 MG TABS tablet Take 1 tablet (90 mg total) by mouth 2 (two) times daily. 60 tablet 5  . torsemide (DEMADEX) 20 MG tablet Take 3 tablets (60 mg total) by mouth daily. 90 tablet 5   Current Facility-Administered Medications on File Prior to Visit  Medication Dose Route Frequency Provider Last Rate Last Dose  . aflibercept (EYLEA) SOLN 2 mg  2 mg Intravitreal  [name], MD   2 mg at 04/25/18 0029    ROS Review of Systems  Objective:  BP (!) 112/57   Pulse 75   Temp (!) 97 F (36.1 C) (Oral)   Ht 5\' 9"  (1.753 m)   Wt 213 lb 4 oz (96.7 kg)   BMI 31.49 kg/m   BP Readings from Last 3 Encounters:  04/27/18 (!) 112/57  04/18/18 140/88  04/10/18 126/87    Wt Readings from Last 3 Encounters:  04/27/18 213 lb 4 oz (96.7 kg)  04/18/18 196 lb (88.9 kg)  04/10/18 211 lb 9.6 oz (96 kg)     Physical Exam    Assessment & Plan:   Brad Mendoza was seen today for medical management of chronic issues.  Diagnoses and all orders for this visit:  Type 2 diabetes mellitus with complication, with long-term current use of insulin (HCC) -     metFORMIN (GLUCOPHAGE-XR) 750 MG 24 hr tablet; Take 1 tablet (750 mg total) by mouth daily with breakfast.  Other orders -     Insulin Glargine, 2 Unit Dial, (TOUJEO MAX SOLOSTAR) 300 UNIT/ML SOPN; Inject 45 Units into the skin daily before breakfast AND 15 Units daily before supper. -     pregabalin (LYRICA) 50 MG capsule; 1 qhs X7 days , then 2 qhs X 7d, then 3 qhs X 7d, then 4 qhs -     nabumetone (RELAFEN) 500 MG tablet; Take 2 tablets (1,000 mg total) by mouth 2 (two) times daily. For muscle and joint pain      I have discontinued Brad Mendoza's Toujeo SoloStar. I have also changed his metFORMIN. Additionally, I am having him start on Insulin Glargine (2 Unit Dial), pregabalin, and nabumetone. Lastly, I am having him maintain his aspirin EC, Multiple Vitamins-Minerals (CENTRUM SILVER ADULT 50+ PO),  Capsaicin (ICY HOT ARTHRITIS THERAPY EX), Fish Oil, nitroGLYCERIN, ranolazine, albuterol, fluticasone, Vitamin D3, ticagrelor, carvedilol, hydrALAZINE, isosorbide mononitrate, torsemide, terbinafine, rosuvastatin, pantoprazole, metolazone, empagliflozin, and OneTouch Verio. We will stop administering Bevacizumab, Bevacizumab, Bevacizumab, aflibercept, aflibercept, and aflibercept. Additionally, we will continue to administer aflibercept.  Meds ordered this encounter  Medications  . metFORMIN (GLUCOPHAGE-XR) 750 MG 24 hr tablet    Sig: Take 1 tablet (750 mg total) by mouth daily with breakfast.    Dispense:  30 tablet    Refill:  1  . Insulin Glargine, 2 Unit Dial, (TOUJEO MAX SOLOSTAR) 300 UNIT/ML SOPN    Sig: Inject 45 Units into the skin daily before breakfast AND 15 Units daily before supper.  Dispense:  6 pen    Refill:  11  . pregabalin (LYRICA) 50 MG capsule    Sig: 1 qhs X7 days , then 2 qhs X 7d, then 3 qhs X 7d, then 4 qhs    Dispense:  120 capsule    Refill:  1  . nabumetone (RELAFEN) 500 MG tablet    Sig: Take 2 tablets (1,000 mg total) by mouth 2 (two) times daily. For muscle and joint pain    Dispense:  360 tablet    Refill:  1     Follow-up: Return in about 1 month (around 05/28/2018).  [name], M.D.

## 2018-04-30 DIAGNOSIS — Z9181 History of falling: Secondary | ICD-10-CM | POA: Diagnosis not present

## 2018-04-30 DIAGNOSIS — E1122 Type 2 diabetes mellitus with diabetic chronic kidney disease: Secondary | ICD-10-CM | POA: Diagnosis not present

## 2018-04-30 DIAGNOSIS — Z794 Long term (current) use of insulin: Secondary | ICD-10-CM | POA: Diagnosis not present

## 2018-04-30 DIAGNOSIS — I2510 Atherosclerotic heart disease of native coronary artery without angina pectoris: Secondary | ICD-10-CM | POA: Diagnosis not present

## 2018-04-30 DIAGNOSIS — I130 Hypertensive heart and chronic kidney disease with heart failure and stage 1 through stage 4 chronic kidney disease, or unspecified chronic kidney disease: Secondary | ICD-10-CM | POA: Diagnosis not present

## 2018-04-30 DIAGNOSIS — K219 Gastro-esophageal reflux disease without esophagitis: Secondary | ICD-10-CM | POA: Diagnosis not present

## 2018-04-30 DIAGNOSIS — I5023 Acute on chronic systolic (congestive) heart failure: Secondary | ICD-10-CM | POA: Diagnosis not present

## 2018-04-30 DIAGNOSIS — E785 Hyperlipidemia, unspecified: Secondary | ICD-10-CM | POA: Diagnosis not present

## 2018-04-30 DIAGNOSIS — N183 Chronic kidney disease, stage 3 (moderate): Secondary | ICD-10-CM | POA: Diagnosis not present

## 2018-04-30 DIAGNOSIS — I272 Pulmonary hypertension, unspecified: Secondary | ICD-10-CM | POA: Diagnosis not present

## 2018-04-30 DIAGNOSIS — I255 Ischemic cardiomyopathy: Secondary | ICD-10-CM | POA: Diagnosis not present

## 2018-05-01 ENCOUNTER — Ambulatory Visit: Payer: Medicare Other | Admitting: Family Medicine

## 2018-05-03 DIAGNOSIS — I2510 Atherosclerotic heart disease of native coronary artery without angina pectoris: Secondary | ICD-10-CM | POA: Diagnosis not present

## 2018-05-03 DIAGNOSIS — I5023 Acute on chronic systolic (congestive) heart failure: Secondary | ICD-10-CM | POA: Diagnosis not present

## 2018-05-03 DIAGNOSIS — I130 Hypertensive heart and chronic kidney disease with heart failure and stage 1 through stage 4 chronic kidney disease, or unspecified chronic kidney disease: Secondary | ICD-10-CM | POA: Diagnosis not present

## 2018-05-03 DIAGNOSIS — K219 Gastro-esophageal reflux disease without esophagitis: Secondary | ICD-10-CM | POA: Diagnosis not present

## 2018-05-03 DIAGNOSIS — I272 Pulmonary hypertension, unspecified: Secondary | ICD-10-CM | POA: Diagnosis not present

## 2018-05-03 DIAGNOSIS — Z9181 History of falling: Secondary | ICD-10-CM | POA: Diagnosis not present

## 2018-05-03 DIAGNOSIS — Z794 Long term (current) use of insulin: Secondary | ICD-10-CM | POA: Diagnosis not present

## 2018-05-03 DIAGNOSIS — E785 Hyperlipidemia, unspecified: Secondary | ICD-10-CM | POA: Diagnosis not present

## 2018-05-03 DIAGNOSIS — E1122 Type 2 diabetes mellitus with diabetic chronic kidney disease: Secondary | ICD-10-CM | POA: Diagnosis not present

## 2018-05-03 DIAGNOSIS — I255 Ischemic cardiomyopathy: Secondary | ICD-10-CM | POA: Diagnosis not present

## 2018-05-03 DIAGNOSIS — N183 Chronic kidney disease, stage 3 (moderate): Secondary | ICD-10-CM | POA: Diagnosis not present

## 2018-05-07 ENCOUNTER — Inpatient Hospital Stay (HOSPITAL_COMMUNITY): Payer: Medicare Other

## 2018-05-07 ENCOUNTER — Other Ambulatory Visit: Payer: Self-pay

## 2018-05-07 NOTE — Patient Outreach (Signed)
[facility]) Care Management  05/07/2018  Brad Mendoza March 28, 1955 [id number]    Unsuccessful call to the patient for assessment. No answer.  Unable to leave a message for the patient due to the voicemail has not been set up.  Plan: RN Health Coach will send letter. [name] will make outreach attempt to the patient within a month.  [name] RN, BSN, [place] Direct Dial:  [phone]  Fax: [phone]

## 2018-05-08 DIAGNOSIS — I5023 Acute on chronic systolic (congestive) heart failure: Secondary | ICD-10-CM | POA: Diagnosis not present

## 2018-05-08 DIAGNOSIS — Z9181 History of falling: Secondary | ICD-10-CM | POA: Diagnosis not present

## 2018-05-08 DIAGNOSIS — K219 Gastro-esophageal reflux disease without esophagitis: Secondary | ICD-10-CM | POA: Diagnosis not present

## 2018-05-08 DIAGNOSIS — Z794 Long term (current) use of insulin: Secondary | ICD-10-CM | POA: Diagnosis not present

## 2018-05-08 DIAGNOSIS — E1122 Type 2 diabetes mellitus with diabetic chronic kidney disease: Secondary | ICD-10-CM | POA: Diagnosis not present

## 2018-05-08 DIAGNOSIS — I255 Ischemic cardiomyopathy: Secondary | ICD-10-CM | POA: Diagnosis not present

## 2018-05-08 DIAGNOSIS — E785 Hyperlipidemia, unspecified: Secondary | ICD-10-CM | POA: Diagnosis not present

## 2018-05-08 DIAGNOSIS — I130 Hypertensive heart and chronic kidney disease with heart failure and stage 1 through stage 4 chronic kidney disease, or unspecified chronic kidney disease: Secondary | ICD-10-CM | POA: Diagnosis not present

## 2018-05-08 DIAGNOSIS — N183 Chronic kidney disease, stage 3 (moderate): Secondary | ICD-10-CM | POA: Diagnosis not present

## 2018-05-08 DIAGNOSIS — I272 Pulmonary hypertension, unspecified: Secondary | ICD-10-CM | POA: Diagnosis not present

## 2018-05-08 DIAGNOSIS — I2510 Atherosclerotic heart disease of native coronary artery without angina pectoris: Secondary | ICD-10-CM | POA: Diagnosis not present

## 2018-05-10 ENCOUNTER — Other Ambulatory Visit: Payer: Self-pay

## 2018-05-10 DIAGNOSIS — I255 Ischemic cardiomyopathy: Secondary | ICD-10-CM | POA: Diagnosis not present

## 2018-05-10 DIAGNOSIS — I272 Pulmonary hypertension, unspecified: Secondary | ICD-10-CM | POA: Diagnosis not present

## 2018-05-10 DIAGNOSIS — I5023 Acute on chronic systolic (congestive) heart failure: Secondary | ICD-10-CM | POA: Diagnosis not present

## 2018-05-10 DIAGNOSIS — Z9181 History of falling: Secondary | ICD-10-CM | POA: Diagnosis not present

## 2018-05-10 DIAGNOSIS — Z794 Long term (current) use of insulin: Secondary | ICD-10-CM | POA: Diagnosis not present

## 2018-05-10 DIAGNOSIS — I130 Hypertensive heart and chronic kidney disease with heart failure and stage 1 through stage 4 chronic kidney disease, or unspecified chronic kidney disease: Secondary | ICD-10-CM | POA: Diagnosis not present

## 2018-05-10 DIAGNOSIS — N183 Chronic kidney disease, stage 3 (moderate): Secondary | ICD-10-CM | POA: Diagnosis not present

## 2018-05-10 DIAGNOSIS — E785 Hyperlipidemia, unspecified: Secondary | ICD-10-CM | POA: Diagnosis not present

## 2018-05-10 DIAGNOSIS — E1122 Type 2 diabetes mellitus with diabetic chronic kidney disease: Secondary | ICD-10-CM | POA: Diagnosis not present

## 2018-05-10 DIAGNOSIS — I2510 Atherosclerotic heart disease of native coronary artery without angina pectoris: Secondary | ICD-10-CM | POA: Diagnosis not present

## 2018-05-10 DIAGNOSIS — K219 Gastro-esophageal reflux disease without esophagitis: Secondary | ICD-10-CM | POA: Diagnosis not present

## 2018-05-10 NOTE — Patient Outreach (Signed)
[facility]) Care Management  05/10/2018   Brad Mendoza [birth year] [id number]  Subjective: Successful call to the patient for assessment.  HIPAA verified.   The patient states that he is doing fair.  He states that he has been having pain in his lower back on the left side and going down his leg.   He rates the pain at a 8/10.  He was given Lyrica by his primary care to help with the pain. The patient states that he did not weigh today but his weight yesterday was 213 lbs. Encouraged the patient to weigh daily and to record his weights.  He denies any shortness of breath swelling or chest pain.  Patient had an appointment on 3/2 with his primary and his a1c came down from 14.0 to 12.7.  He states that he is monitoring his diet and taking his medications as prescribed.  The patient states that he has appointments with ophthalmology and cardiology in April.  Current Medications:  Current Outpatient Medications  Medication Sig Dispense Refill  . albuterol (PROVENTIL HFA;VENTOLIN HFA) 108 (90 Base) MCG/ACT inhaler Inhale 2 puffs into the lungs every 6 (six) hours as needed for wheezing or shortness of breath. 1 Inhaler 2  . aspirin EC 81 MG tablet Take 81 mg by mouth daily.    . Capsaicin (ICY HOT ARTHRITIS THERAPY EX) Apply 1 application topically 2 (two) times daily as needed (back pain).     . carvedilol (COREG) 6.25 MG tablet Take 1 tablet (6.25 mg total) by mouth 2 (two) times daily with a meal. 60 tablet 5  . Cholecalciferol (VITAMIN D3) 25 MCG (1000 UT) CAPS Take 2,000 Units by mouth daily.    . empagliflozin (JARDIANCE) 10 MG TABS tablet Take 10 mg by mouth daily. 30 tablet 6  . fluticasone (FLONASE) 50 MCG/ACT nasal spray USE 2 SPRAYS IN EACH NOSTRIL DAILY (Patient taking differently: Place 1 spray into both nostrils daily as needed for allergies or rhinitis. ) 48 g 1  . hydrALAZINE (APRESOLINE) 100 MG tablet Take 1 tablet (100 mg total) by mouth 3 (three) times daily. 90  tablet 5  . Insulin Glargine, 2 Unit Dial, (TOUJEO MAX SOLOSTAR) 300 UNIT/ML SOPN Inject 45 Units into the skin daily before breakfast AND 15 Units daily before supper. 6 pen 11  . isosorbide mononitrate (IMDUR) 120 MG 24 hr tablet Take 1 tablet (120 mg total) by mouth daily. 30 tablet 5  . metFORMIN (GLUCOPHAGE-XR) 750 MG 24 hr tablet Take 1 tablet (750 mg total) by mouth daily with breakfast. 30 tablet 1  . metolazone (ZAROXOLYN) 5 MG tablet Take 1 tablet (5 mg total) by mouth 2 (two) times a week. Take on Mondays and Fridays 10 tablet 5  . Multiple Vitamins-Minerals (CENTRUM SILVER ADULT 50+ PO) Take 1 tablet by mouth daily.    . nabumetone (RELAFEN) 500 MG tablet Take 2 tablets (1,000 mg total) by mouth 2 (two) times daily. For muscle and joint pain 360 tablet 1  . nitroGLYCERIN (NITROSTAT) 0.4 MG SL tablet DISSOLVE 1 TAB UNDER TOUNGE FOR CHEST PAIN. MAY REPEAT EVERY 5 MINUTES FOR 3 DOSES. IF NO RELIEF CALL 911 OR GO TO ER (Patient taking differently: Place 0.4 mg under the tongue every 5 (five) minutes as needed for chest pain. ) 25 tablet 2  . Omega-3 Fatty Acids (FISH OIL) 1000 MG CPDR Take 1,000 mg by mouth daily.     [name] VERIO test strip USE FOUR TIMES DAILY  AS NEEDED 100 each 11  . pantoprazole (PROTONIX) 40 MG tablet Take 1 tablet (40 mg total) by mouth daily. 90 tablet 1  . pregabalin (LYRICA) 50 MG capsule 1 qhs X7 days , then 2 qhs X 7d, then 3 qhs X 7d, then 4 qhs 120 capsule 1  . ranolazine (RANEXA) 500 MG 12 hr tablet TAKE ONE TABLET BY MOUTH TWICE DAILY (Patient taking differently: Take 500 mg by mouth 2 (two) times daily. ) 60 tablet 5  . rosuvastatin (CRESTOR) 10 MG tablet Take 1 tablet (10 mg total) by mouth daily. 90 tablet 1  . terbinafine (LAMISIL) 1 % cream Apply 1 application topically 2 (two) times daily. Apply to both feet and between toes 30 g 1  . ticagrelor (BRILINTA) 90 MG TABS tablet Take 1 tablet (90 mg total) by mouth 2 (two) times daily. 60 tablet 5  .  torsemide (DEMADEX) 20 MG tablet Take 3 tablets (60 mg total) by mouth daily. 90 tablet 5   Current Facility-Administered Medications  Medication Dose Route Frequency Provider Last Rate Last Dose  . aflibercept (EYLEA) SOLN 2 mg  2 mg Intravitreal  [name], MD   2 mg at 04/25/18 0029    Functional Status:  In your present state of health, do you have any difficulty performing the following activities: 03/20/2018 03/20/2018  Hearing? - N  Vision? - N  Difficulty concentrating or making decisions? - N  Walking or climbing stairs? - N  Dressing or bathing? - N  Doing errands, shopping? N -  Some recent data might be hidden    Fall/Depression Screening: Fall Risk  04/10/2018 04/02/2018 03/02/2018  Falls in the past year? 0 0 0  Comment - - -  Number falls in past yr: - - -  Injury with Fall? - - -  Comment - - -  Risk for fall due to : - - -  Follow up - - -   PHQ 2/9 Scores 04/10/2018 01/25/2018 12/14/2016 12/02/2016 09/26/2016 09/14/2016 09/01/2016  PHQ - 2 Score 0 0 1 0 0 0 0    Assessment: Patient will continue to benefit from health coach outreach for disease management and support. [facility] CM Care Plan Problem One     Most Recent Value  [facility] Long Term Goal   In 30 days the patient will verbalize that he has watched his diet to lower his a1c and staying out of the hospital for chf  [facility] Long Term Goal Start Date  05/10/18  Interventions for Problem One Long Term Goal  Discussed with patient low carbohydrate diet, reviewed medications and encouraged medication compliance, encouraged to keep and attend medical appointments Reviewed signs and symptoms of heart failure encouraged patient to weigh daily advised patient to avoid people who are sick, washing hands with soap and water or using hand sanitizer.       Plan: RN Health Coach will contact patient in the month of April and patient agrees to next outreach.   [name] RN, BSN, [place] Direct Dial:  [phone]  Fax: [phone]

## 2018-05-15 DIAGNOSIS — I255 Ischemic cardiomyopathy: Secondary | ICD-10-CM | POA: Diagnosis not present

## 2018-05-15 DIAGNOSIS — Z794 Long term (current) use of insulin: Secondary | ICD-10-CM | POA: Diagnosis not present

## 2018-05-15 DIAGNOSIS — N183 Chronic kidney disease, stage 3 (moderate): Secondary | ICD-10-CM | POA: Diagnosis not present

## 2018-05-15 DIAGNOSIS — I5023 Acute on chronic systolic (congestive) heart failure: Secondary | ICD-10-CM | POA: Diagnosis not present

## 2018-05-15 DIAGNOSIS — E1122 Type 2 diabetes mellitus with diabetic chronic kidney disease: Secondary | ICD-10-CM | POA: Diagnosis not present

## 2018-05-15 DIAGNOSIS — K219 Gastro-esophageal reflux disease without esophagitis: Secondary | ICD-10-CM | POA: Diagnosis not present

## 2018-05-15 DIAGNOSIS — I2510 Atherosclerotic heart disease of native coronary artery without angina pectoris: Secondary | ICD-10-CM | POA: Diagnosis not present

## 2018-05-15 DIAGNOSIS — I130 Hypertensive heart and chronic kidney disease with heart failure and stage 1 through stage 4 chronic kidney disease, or unspecified chronic kidney disease: Secondary | ICD-10-CM | POA: Diagnosis not present

## 2018-05-15 DIAGNOSIS — E785 Hyperlipidemia, unspecified: Secondary | ICD-10-CM | POA: Diagnosis not present

## 2018-05-15 DIAGNOSIS — I272 Pulmonary hypertension, unspecified: Secondary | ICD-10-CM | POA: Diagnosis not present

## 2018-05-15 DIAGNOSIS — Z9181 History of falling: Secondary | ICD-10-CM | POA: Diagnosis not present

## 2018-05-16 ENCOUNTER — Other Ambulatory Visit: Payer: Self-pay

## 2018-05-16 ENCOUNTER — Ambulatory Visit (INDEPENDENT_AMBULATORY_CARE_PROVIDER_SITE_OTHER): Payer: Medicare Other

## 2018-05-16 DIAGNOSIS — E1122 Type 2 diabetes mellitus with diabetic chronic kidney disease: Secondary | ICD-10-CM

## 2018-05-16 DIAGNOSIS — I272 Pulmonary hypertension, unspecified: Secondary | ICD-10-CM

## 2018-05-16 DIAGNOSIS — I5023 Acute on chronic systolic (congestive) heart failure: Secondary | ICD-10-CM | POA: Diagnosis not present

## 2018-05-16 DIAGNOSIS — I2510 Atherosclerotic heart disease of native coronary artery without angina pectoris: Secondary | ICD-10-CM

## 2018-05-16 DIAGNOSIS — I130 Hypertensive heart and chronic kidney disease with heart failure and stage 1 through stage 4 chronic kidney disease, or unspecified chronic kidney disease: Secondary | ICD-10-CM

## 2018-05-16 DIAGNOSIS — N183 Chronic kidney disease, stage 3 (moderate): Secondary | ICD-10-CM

## 2018-05-16 DIAGNOSIS — Z794 Long term (current) use of insulin: Secondary | ICD-10-CM

## 2018-05-16 DIAGNOSIS — K219 Gastro-esophageal reflux disease without esophagitis: Secondary | ICD-10-CM

## 2018-05-16 DIAGNOSIS — I255 Ischemic cardiomyopathy: Secondary | ICD-10-CM

## 2018-05-16 DIAGNOSIS — Z9181 History of falling: Secondary | ICD-10-CM

## 2018-05-16 DIAGNOSIS — F1010 Alcohol abuse, uncomplicated: Secondary | ICD-10-CM

## 2018-05-16 DIAGNOSIS — F17220 Nicotine dependence, chewing tobacco, uncomplicated: Secondary | ICD-10-CM

## 2018-05-16 DIAGNOSIS — E785 Hyperlipidemia, unspecified: Secondary | ICD-10-CM

## 2018-05-18 DIAGNOSIS — E1122 Type 2 diabetes mellitus with diabetic chronic kidney disease: Secondary | ICD-10-CM | POA: Diagnosis not present

## 2018-05-18 DIAGNOSIS — K219 Gastro-esophageal reflux disease without esophagitis: Secondary | ICD-10-CM | POA: Diagnosis not present

## 2018-05-18 DIAGNOSIS — N183 Chronic kidney disease, stage 3 (moderate): Secondary | ICD-10-CM | POA: Diagnosis not present

## 2018-05-18 DIAGNOSIS — I272 Pulmonary hypertension, unspecified: Secondary | ICD-10-CM | POA: Diagnosis not present

## 2018-05-18 DIAGNOSIS — I2510 Atherosclerotic heart disease of native coronary artery without angina pectoris: Secondary | ICD-10-CM | POA: Diagnosis not present

## 2018-05-18 DIAGNOSIS — E785 Hyperlipidemia, unspecified: Secondary | ICD-10-CM | POA: Diagnosis not present

## 2018-05-18 DIAGNOSIS — Z794 Long term (current) use of insulin: Secondary | ICD-10-CM | POA: Diagnosis not present

## 2018-05-18 DIAGNOSIS — I130 Hypertensive heart and chronic kidney disease with heart failure and stage 1 through stage 4 chronic kidney disease, or unspecified chronic kidney disease: Secondary | ICD-10-CM | POA: Diagnosis not present

## 2018-05-18 DIAGNOSIS — Z9181 History of falling: Secondary | ICD-10-CM | POA: Diagnosis not present

## 2018-05-18 DIAGNOSIS — I5023 Acute on chronic systolic (congestive) heart failure: Secondary | ICD-10-CM | POA: Diagnosis not present

## 2018-05-18 DIAGNOSIS — I255 Ischemic cardiomyopathy: Secondary | ICD-10-CM | POA: Diagnosis not present

## 2018-05-21 NOTE — Progress Notes (Signed)
[facility] Note  05/29/2018     CHIEF COMPLAINT Patient presents for Retina Follow Up   HISTORY OF PRESENT ILLNESS: Brad Mendoza is a 63 y.o. male who presents to the clinic today for:   HPI    Retina Follow Up    Patient presents with  Diabetic Retinopathy.  In both eyes.  Severity is moderate.  Duration of 4 weeks.  Since onset it is stable.  I, the attending physician,  performed the HPI with the patient and updated documentation appropriately.          Comments    4 week follow up for NPDR OU, patient states no changes in vision. Patient blood sugar 200's yesterday       Last edited by [name], MD on 05/29/2018  9:03 AM. (History)      Referring physician: [name], MD [place], [place] [id number]  HISTORICAL INFORMATION:   Selected notes from the MEDICAL RECORD NUMBER Referred by Dr. [name] for concern of CSME OS;  LEE- 04.04.19 ([name]) [BCVA OD: 20/20 OS: 20/40] Ocular Hx-  PMH- DM, CAD, CHF, HTN, hyperlipidemia    CURRENT MEDICATIONS: No current outpatient medications on file. (Ophthalmic Drugs)   Current Facility-Administered Medications (Ophthalmic Drugs)  Medication Route  . aflibercept (EYLEA) SOLN 2 mg Intravitreal   Current Outpatient Medications (Other)  Medication Sig  . aspirin EC 81 MG tablet Take 81 mg by mouth daily.  . Capsaicin (ICY HOT ARTHRITIS THERAPY EX) Apply 1 application topically 2 (two) times daily as needed (back pain).   . carvedilol (COREG) 6.25 MG tablet Take 1 tablet (6.25 mg total) by mouth 2 (two) times daily with a meal.  . Cholecalciferol (VITAMIN D3) 25 MCG (1000 UT) CAPS Take 2,000 Units by mouth daily.  . empagliflozin (JARDIANCE) 10 MG TABS tablet Take 10 mg by mouth daily.  . fluticasone (FLONASE) 50 MCG/ACT nasal spray USE 2 SPRAYS IN EACH NOSTRIL DAILY (Patient taking differently: Place 1 spray into both nostrils daily as needed for allergies or rhinitis. )  . hydrALAZINE  (APRESOLINE) 100 MG tablet Take 1 tablet (100 mg total) by mouth 3 (three) times daily.  . Insulin Glargine, 2 Unit Dial, (TOUJEO MAX SOLOSTAR) 300 UNIT/ML SOPN Inject 45 Units into the skin daily before breakfast AND 15 Units daily before supper.  . isosorbide mononitrate (IMDUR) 120 MG 24 hr tablet Take 1 tablet (120 mg total) by mouth daily.  . metFORMIN (GLUCOPHAGE-XR) 750 MG 24 hr tablet Take 1 tablet (750 mg total) by mouth daily with breakfast.  . metolazone (ZAROXOLYN) 5 MG tablet Take 1 tablet (5 mg total) by mouth 2 (two) times a week. Take on Mondays and Fridays  . Multiple Vitamins-Minerals (CENTRUM SILVER ADULT 50+ PO) Take 1 tablet by mouth daily.  . nabumetone (RELAFEN) 500 MG tablet Take 2 tablets (1,000 mg total) by mouth 2 (two) times daily. For muscle and joint pain  . nitroGLYCERIN (NITROSTAT) 0.4 MG SL tablet DISSOLVE 1 TAB UNDER TOUNGE FOR CHEST PAIN. MAY REPEAT EVERY 5 MINUTES FOR 3 DOSES. IF NO RELIEF CALL 911 OR GO TO ER (Patient taking differently: Place 0.4 mg under the tongue every 5 (five) minutes as needed for chest pain. )  . Omega-3 Fatty Acids (FISH OIL) 1000 MG CPDR Take 1,000 mg by mouth daily.   [name] VERIO test strip USE FOUR TIMES DAILY AS NEEDED  . pantoprazole (PROTONIX) 40 MG  tablet Take 1 tablet (40 mg total) by mouth daily.  . pregabalin (LYRICA) 225 MG capsule TAKE 1 CAPSULE AT BEDTIME FOR 7 DAYS THEN 2 CAPSULES AT BEDTIME FOR 7DAYS THEN 3 CAPSULES AT BEDTIME FOR 7 DAYS THEN 4 CAPSULES AT BEDTIME  . PROAIR HFA 108 (90 Base) MCG/ACT inhaler USE 2 PUFFS EVERY 6 HOURS AS NEEDED  . ranolazine (RANEXA) 500 MG 12 hr tablet TAKE ONE TABLET BY MOUTH TWICE DAILY (Patient taking differently: Take 500 mg by mouth 2 (two) times daily. )  . rosuvastatin (CRESTOR) 10 MG tablet Take 1 tablet (10 mg total) by mouth daily.  [name] terbinafine (LAMISIL) 1 % cream Apply 1 application topically 2 (two) times daily. Apply to both feet and between toes  . torsemide (DEMADEX) 20  MG tablet Take 3 tablets (60 mg total) by mouth daily.   No current facility-administered medications for this visit.  (Other)      REVIEW OF SYSTEMS: ROS    Positive for: Endocrine, Cardiovascular, Eyes   Negative for: Constitutional, Gastrointestinal, Neurological, Skin, Genitourinary, Musculoskeletal, HENT, Respiratory, Psychiatric, Allergic/Imm, Heme/Lymph   Last edited by Elmore Guise on 05/29/2018  8:50 AM. (History)       ALLERGIES Allergies  Allergen Reactions  . Sulfa Antibiotics Shortness Of Breath  . Penicillins Rash and Other (See Comments)    Has patient had a PCN reaction causing immediate rash, facial/tongue/throat swelling, SOB or lightheadedness with hypotension: No Has patient had a PCN reaction causing severe rash involving mucus membranes or skin necrosis: No Has patient had a PCN reaction that required hospitalization No Has patient had a PCN reaction occurring within the last 10 years: No If all of the above answers are "NO", then may proceed with Cephalosporin use.     PAST MEDICAL HISTORY Past Medical History:  Diagnosis Date  . Acid reflux   . Back pain    f4 and f5  . CAD (coronary artery disease)   . CHF (congestive heart failure) ([place])    last echo was in March 2016  . Diabetes mellitus 2005  . Diverticulosis   . H/O chest pain 2005   ECHO  . HTN (hypertension)   . Hyperlipidemia   . Nonischemic cardiomyopathy ([place])   . Onychomycosis   . Polysubstance abuse [facility])    Past Surgical History:  Procedure Laterality Date  . APPENDECTOMY    . CARDIAC CATHETERIZATION  2005   4 frech cath  . COLONOSCOPY N/A 08/01/2013   Procedure: COLONOSCOPY;  Surgeon: Rogene Houston, MD;  [facility];  Service: Endoscopy;  Laterality: N/A;  rescheduled to [place] notified pt  . CORONARY STENT INTERVENTION N/A 03/19/2018   Procedure: CORONARY STENT INTERVENTION;  Surgeon: Leonie Man, MD;  Location: [place] [facility];  Service:  Cardiovascular;  Laterality: N/A;  . ESOPHAGOGASTRODUODENOSCOPY N/A 04/23/2015   Procedure: ESOPHAGOGASTRODUODENOSCOPY (EGD);  Surgeon: Rogene Houston, MD;  [facility];  Service: Endoscopy;  Laterality: N/A;  1:25  . RIGHT/LEFT HEART CATH AND CORONARY ANGIOGRAPHY N/A 06/03/2016   Procedure: Right/Left Heart Cath and Coronary Angiography;  Surgeon: Belva Crome, MD;  Location: [place] [facility];  Service: Cardiovascular;  Laterality: N/A;  . RIGHT/LEFT HEART CATH AND CORONARY ANGIOGRAPHY N/A 03/19/2018   Procedure: RIGHT/LEFT HEART CATH AND CORONARY ANGIOGRAPHY;  Surgeon: Jolaine Artist, MD;  Location: [place] [facility];  Service: Cardiovascular;  Laterality: N/A;    FAMILY HISTORY Family History  Problem Relation Age  of Onset  . Hypertension Father   . Diabetes Father   . Cancer Mother   . Alcohol abuse Brother   . Colon cancer Neg Hx     SOCIAL HISTORY Social History   Tobacco Use  . Smoking status: Never Smoker  . Smokeless tobacco: Current User    Types: Snuff  . Tobacco comment: dips snuff about 6 cans/week for 3 years  Substance Use Topics  . Alcohol use: Yes    Alcohol/week: 1.0 standard drinks    Types: 1 Cans of beer per week    Comment: occ - has cut down, now once every 2-3 months  . Drug use: Yes    Types: Marijuana    Comment: occasional use - smoking less         OPHTHALMIC EXAM:  Base Eye Exam    Visual Acuity (Snellen - Linear)      Right Left   Dist cc 20/30-2 20/40-2   Dist ph cc 20/NI 20/NI       Tonometry (Tonopen, 8:52 AM)      Right Left   Pressure 10 11       Pupils      Dark Light Shape React APD   Right 3 2 Round Brisk None   Left 3 2 Round Brisk None       Visual [place]let (Counting fingers)      Left Right     Full       Extraocular Movement      Right Left    Full, Ortho Full, Ortho       Neuro/Psych    Oriented x3:  Yes   Mood/Affect:  Normal       Dilation    Both eyes:  2.5% Phenylephrine, 1.0%  Mydriacyl @ 8:52 AM        Slit Lamp and Fundus Exam    Slit Lamp Exam      Right Left   Lids/Lashes Dermatochalasis - upper lid Dermatochalasis - upper lid   Conjunctiva/Sclera Melanosis Melanosis   Cornea Arcus, trace inferior and central Punctate epithelial erosions Arcus, trace inferior and central Punctate epithelial erosions, round k scar inferonasal para-central   Anterior Chamber Deep and quiet Deep and quiet   Iris Round and dilated, No NVI Round and dilated, No NVI   Lens 2+ Nuclear sclerosis, 2+ Cortical cataract, Vacuoles 2+ Nuclear sclerosis, 2+ Cortical cataract, Vacuoles   Vitreous Vitreous syneresis, prominent vitreous base nasally  Vitreous syneresis, prominent vitreous base nasally       Fundus Exam      Right Left   Disc Normal Normal   C/D Ratio 0.4 0.4   Macula blunted foveal reflex, scattered Microaneurysms, mild cystic changes -- mild interval increase, RPE mottling Blunted foveal reflex, +persistent edema - mild interval improvement, scattered MAs, scattered DBH, scattered exudates -- improved, CWS along ST arcades   Vessels Mild Vascular attenuation Mild Vascular attenuation   Periphery Attached, scattered MAs, scattered White without pressure Attached, scattered DBH, scattered CWS -- improved, scattered White without pressure        Refraction    Wearing Rx      Sphere Cylinder Axis Add   Right -0.25 +1.00 019 +2.50   Left -0.25 +1.00 153 +2.50          IMAGING AND PROCEDURES  Imaging and Procedures for 05/31/17  OCT, Retina - OU - Both Eyes       Right Eye Quality was good. Central Foveal  Thickness: 299. Progression has worsened. Findings include no SRF, intraretinal fluid, abnormal foveal contour (Mild Interval increase in IRF).   Left Eye Quality was good. Central Foveal Thickness: 250. Progression has improved. Findings include normal foveal contour, intraretinal fluid, no SRF (Mild interval decrease in IRF).   Notes *Images captured and  stored on drive  Diagnosis / Impression:  DME OU, OS>OD Mild Interval increase in IRF OD Mild interval decrease in IRF OS  Clinical management:  See below  Abbreviations: NFP - Normal foveal profile. CME - cystoid macular edema. PED - pigment epithelial detachment. IRF - intraretinal fluid. SRF - subretinal fluid. EZ - ellipsoid zone. ERM - epiretinal membrane. ORA - outer retinal atrophy. ORT - outer retinal tubulation. SRHM - subretinal hyper-reflective material         Intravitreal Injection, Pharmacologic Agent - OS - Left Eye       Time Out 05/29/2018. 9:26 AM. Confirmed correct patient, procedure, site, and patient consented.   Anesthesia Anesthetic medications included Proparacaine 0.5%.   Procedure Preparation included eyelid speculum. A 30 gauge needle was used.   Injection:  2 mg aflibercept Alfonse Flavors) SOLN   NDC: A3590391, Lot: [id number], Expiration date: 10/21/2018   Route: Intravitreal, Site: Left Eye, Waste: 0.05 mL  Post-op Post injection exam found visual acuity of at least counting fingers. The patient tolerated the procedure well. There were no complications. The patient received written and verbal post procedure care education.        Intravitreal Injection, Pharmacologic Agent - OD - Right Eye       Time Out 05/29/2018. 9:27 AM. Confirmed correct patient, procedure, site, and patient consented.   Anesthesia Topical anesthesia was used. Anesthetic medications included Lidocaine 2%, Proparacaine 0.5%.   Procedure Preparation included 5% betadine to ocular surface, eyelid speculum. A supplied needle was used.   Injection:  1.25 mg Bevacizumab (AVASTIN) SOLN   NDC: 46962-952-84, Lot: [id number] , Expiration date: 07/11/2018   Route: Intravitreal, Site: Right Eye, Waste: 0 mL  Post-op Post injection exam found visual acuity of at least counting fingers. The patient tolerated the procedure well. There were no complications. The patient received  written and verbal post procedure care education.                 ASSESSMENT/PLAN:    ICD-10-CM   1. Moderate nonproliferative diabetic retinopathy of both eyes with macular edema associated with type 2 diabetes mellitus (HCC) X32.4401 Intravitreal Injection, Pharmacologic Agent - OS - Left Eye    Intravitreal Injection, Pharmacologic Agent - OD - Right Eye    Bevacizumab (AVASTIN) SOLN 1.25 mg    aflibercept (EYLEA) SOLN 2 mg  2. Retinal edema H35.81 OCT, Retina - OU - Both Eyes  3. Essential hypertension I10   4. Hypertensive retinopathy of both eyes H35.033   5. Combined forms of age-related cataract of both eyes H25.813     1,2. Moderate to severe non-proliferative diabetic retinopathy, OU - initial exam showed scattered IRH, exudates, and cotton wool spots, OS > OD, no NV - FA 4.10.19 shows no NV OU - S/P IVA OS #1 (05.29.19), #2 (06.26.19), #3 (07.29.19) - S/P IVE OS #1 (08.30.19), #2 (10.01.19), #3 (10.31.19), #4 (03.02.20) - OCT shows interval improvement in diabetic macular edema OS -- good response to Eylea; OD with interval increase in IRF - BCVA decreased to 20/30 from 20/25 OD and decreased to 20/40 from 20/30 OS - recommend IVA #1 OD and IVE #5 OS today (04.07.20) - pt  wishes to proceed - RBA of procedure discussed, questions answered - informed consent obtained and signed - see procedure note - Eylea4U paper work and benefits investigation started on 07.29.19 -- Approved for Good Days for 2020 - f/u in 4 weeks -- DFE; OCT; possible injection  3,4. Hypertensive retinopathy OU - discussed importance of tight BP control - monitor  5. Combined form age-related cataract OU-  - The symptoms of cataract, surgical options, and treatments and risks were discussed with patient. - discussed diagnosis and progression - not yet visually significant - monitor for now   Ophthalmic Meds Ordered this visit:  Meds ordered this encounter  Medications  . Bevacizumab  (AVASTIN) SOLN 1.25 mg  . aflibercept (EYLEA) SOLN 2 mg       Return in about 4 weeks (around 06/26/2018) for f/u NPDR OU, DFE, OCT.  There are no Patient Instructions on file for this visit.   Explained the diagnoses, plan, and follow up with the patient and they expressed understanding.  Patient expressed understanding of the importance of proper follow up care.   This document serves as a record of services personally performed by Gardiner Sleeper, MD, PhD. It was created on their behalf by Ernest Mallick, OA, an ophthalmic assistant. The creation of this record is the provider's dictation and/or activities during the visit.    Electronically signed by: Ernest Mallick, OA  04.01.2020 11:36 AM     Gardiner Sleeper, M.D., Ph.D. Diseases & Surgery of the Retina and Vitreous [facility]  I have reviewed the above documentation for accuracy and completeness, and I agree with the above. Gardiner Sleeper, M.D., Ph.D. 05/29/18 11:36 AM     Abbreviations: M myopia (nearsighted); A astigmatism; H hyperopia (farsighted); P presbyopia; Mrx spectacle prescription;  CTL contact lenses; OD right eye; OS left eye; OU both eyes  XT exotropia; ET esotropia; PEK punctate epithelial keratitis; PEE punctate epithelial erosions; DES dry eye syndrome; MGD meibomian gland dysfunction; ATs artificial tears; PFAT's preservative free artificial tears; [place] nuclear sclerotic cataract; PSC posterior subcapsular cataract; ERM epi-retinal membrane; PVD posterior vitreous detachment; RD retinal detachment; DM diabetes mellitus; DR diabetic retinopathy; NPDR non-proliferative diabetic retinopathy; PDR proliferative diabetic retinopathy; CSME clinically significant macular edema; DME diabetic macular edema; dbh dot blot hemorrhages; CWS cotton wool spot; POAG primary open angle glaucoma; C/D cup-to-disc ratio; HVF humphrey visual field; GVF goldmann visual field; OCT optical coherence tomography; IOP  intraocular pressure; BRVO Branch retinal vein occlusion; CRVO central retinal vein occlusion; CRAO central retinal artery occlusion; BRAO branch retinal artery occlusion; RT retinal tear; SB scleral buckle; PPV pars plana vitrectomy; VH Vitreous hemorrhage; PRP panretinal laser photocoagulation; IVK intravitreal kenalog; VMT vitreomacular traction; MH Macular hole;  NVD neovascularization of the disc; NVE neovascularization elsewhere; AREDS age related eye disease study; ARMD age related macular degeneration; POAG primary open angle glaucoma; EBMD epithelial/anterior basement membrane dystrophy; ACIOL anterior chamber intraocular lens; IOL intraocular lens; PCIOL posterior chamber intraocular lens; Phaco/IOL phacoemulsification with intraocular lens placement; [place] photorefractive keratectomy; LASIK laser assisted in situ keratomileusis; HTN hypertension; DM diabetes mellitus; COPD chronic obstructive pulmonary disease

## 2018-05-22 DIAGNOSIS — K219 Gastro-esophageal reflux disease without esophagitis: Secondary | ICD-10-CM | POA: Diagnosis not present

## 2018-05-22 DIAGNOSIS — E1122 Type 2 diabetes mellitus with diabetic chronic kidney disease: Secondary | ICD-10-CM | POA: Diagnosis not present

## 2018-05-22 DIAGNOSIS — I255 Ischemic cardiomyopathy: Secondary | ICD-10-CM | POA: Diagnosis not present

## 2018-05-22 DIAGNOSIS — Z9181 History of falling: Secondary | ICD-10-CM | POA: Diagnosis not present

## 2018-05-22 DIAGNOSIS — N183 Chronic kidney disease, stage 3 (moderate): Secondary | ICD-10-CM | POA: Diagnosis not present

## 2018-05-22 DIAGNOSIS — I5023 Acute on chronic systolic (congestive) heart failure: Secondary | ICD-10-CM | POA: Diagnosis not present

## 2018-05-22 DIAGNOSIS — I2510 Atherosclerotic heart disease of native coronary artery without angina pectoris: Secondary | ICD-10-CM | POA: Diagnosis not present

## 2018-05-22 DIAGNOSIS — I130 Hypertensive heart and chronic kidney disease with heart failure and stage 1 through stage 4 chronic kidney disease, or unspecified chronic kidney disease: Secondary | ICD-10-CM | POA: Diagnosis not present

## 2018-05-22 DIAGNOSIS — Z794 Long term (current) use of insulin: Secondary | ICD-10-CM | POA: Diagnosis not present

## 2018-05-22 DIAGNOSIS — E785 Hyperlipidemia, unspecified: Secondary | ICD-10-CM | POA: Diagnosis not present

## 2018-05-22 DIAGNOSIS — I272 Pulmonary hypertension, unspecified: Secondary | ICD-10-CM | POA: Diagnosis not present

## 2018-05-25 ENCOUNTER — Other Ambulatory Visit: Payer: Self-pay | Admitting: Family Medicine

## 2018-05-25 ENCOUNTER — Other Ambulatory Visit: Payer: Self-pay | Admitting: Family

## 2018-05-25 DIAGNOSIS — Z9181 History of falling: Secondary | ICD-10-CM | POA: Diagnosis not present

## 2018-05-25 DIAGNOSIS — I130 Hypertensive heart and chronic kidney disease with heart failure and stage 1 through stage 4 chronic kidney disease, or unspecified chronic kidney disease: Secondary | ICD-10-CM | POA: Diagnosis not present

## 2018-05-25 DIAGNOSIS — K219 Gastro-esophageal reflux disease without esophagitis: Secondary | ICD-10-CM | POA: Diagnosis not present

## 2018-05-25 DIAGNOSIS — E1122 Type 2 diabetes mellitus with diabetic chronic kidney disease: Secondary | ICD-10-CM | POA: Diagnosis not present

## 2018-05-25 DIAGNOSIS — Z794 Long term (current) use of insulin: Secondary | ICD-10-CM | POA: Diagnosis not present

## 2018-05-25 DIAGNOSIS — I272 Pulmonary hypertension, unspecified: Secondary | ICD-10-CM | POA: Diagnosis not present

## 2018-05-25 DIAGNOSIS — E785 Hyperlipidemia, unspecified: Secondary | ICD-10-CM | POA: Diagnosis not present

## 2018-05-25 DIAGNOSIS — I255 Ischemic cardiomyopathy: Secondary | ICD-10-CM | POA: Diagnosis not present

## 2018-05-25 DIAGNOSIS — N183 Chronic kidney disease, stage 3 (moderate): Secondary | ICD-10-CM | POA: Diagnosis not present

## 2018-05-25 DIAGNOSIS — J189 Pneumonia, unspecified organism: Secondary | ICD-10-CM

## 2018-05-25 DIAGNOSIS — I2510 Atherosclerotic heart disease of native coronary artery without angina pectoris: Secondary | ICD-10-CM | POA: Diagnosis not present

## 2018-05-25 DIAGNOSIS — I5023 Acute on chronic systolic (congestive) heart failure: Secondary | ICD-10-CM | POA: Diagnosis not present

## 2018-05-25 NOTE — Telephone Encounter (Signed)
Seen by [facility] 04/27/18. Please address refill

## 2018-05-29 ENCOUNTER — Ambulatory Visit (INDEPENDENT_AMBULATORY_CARE_PROVIDER_SITE_OTHER): Payer: Medicare Other | Admitting: Ophthalmology

## 2018-05-29 ENCOUNTER — Encounter (INDEPENDENT_AMBULATORY_CARE_PROVIDER_SITE_OTHER): Payer: Self-pay | Admitting: Ophthalmology

## 2018-05-29 ENCOUNTER — Other Ambulatory Visit: Payer: Self-pay

## 2018-05-29 DIAGNOSIS — E1122 Type 2 diabetes mellitus with diabetic chronic kidney disease: Secondary | ICD-10-CM | POA: Diagnosis not present

## 2018-05-29 DIAGNOSIS — R6889 Other general symptoms and signs: Secondary | ICD-10-CM | POA: Diagnosis not present

## 2018-05-29 DIAGNOSIS — I130 Hypertensive heart and chronic kidney disease with heart failure and stage 1 through stage 4 chronic kidney disease, or unspecified chronic kidney disease: Secondary | ICD-10-CM | POA: Diagnosis not present

## 2018-05-29 DIAGNOSIS — H25813 Combined forms of age-related cataract, bilateral: Secondary | ICD-10-CM

## 2018-05-29 DIAGNOSIS — H35033 Hypertensive retinopathy, bilateral: Secondary | ICD-10-CM | POA: Diagnosis not present

## 2018-05-29 DIAGNOSIS — I2510 Atherosclerotic heart disease of native coronary artery without angina pectoris: Secondary | ICD-10-CM | POA: Diagnosis not present

## 2018-05-29 DIAGNOSIS — N183 Chronic kidney disease, stage 3 (moderate): Secondary | ICD-10-CM | POA: Diagnosis not present

## 2018-05-29 DIAGNOSIS — K219 Gastro-esophageal reflux disease without esophagitis: Secondary | ICD-10-CM | POA: Diagnosis not present

## 2018-05-29 DIAGNOSIS — E113313 Type 2 diabetes mellitus with moderate nonproliferative diabetic retinopathy with macular edema, bilateral: Secondary | ICD-10-CM

## 2018-05-29 DIAGNOSIS — Z9181 History of falling: Secondary | ICD-10-CM | POA: Diagnosis not present

## 2018-05-29 DIAGNOSIS — I255 Ischemic cardiomyopathy: Secondary | ICD-10-CM | POA: Diagnosis not present

## 2018-05-29 DIAGNOSIS — I10 Essential (primary) hypertension: Secondary | ICD-10-CM | POA: Diagnosis not present

## 2018-05-29 DIAGNOSIS — Z794 Long term (current) use of insulin: Secondary | ICD-10-CM | POA: Diagnosis not present

## 2018-05-29 DIAGNOSIS — E785 Hyperlipidemia, unspecified: Secondary | ICD-10-CM | POA: Diagnosis not present

## 2018-05-29 DIAGNOSIS — H3581 Retinal edema: Secondary | ICD-10-CM | POA: Diagnosis not present

## 2018-05-29 DIAGNOSIS — I272 Pulmonary hypertension, unspecified: Secondary | ICD-10-CM | POA: Diagnosis not present

## 2018-05-29 DIAGNOSIS — I5023 Acute on chronic systolic (congestive) heart failure: Secondary | ICD-10-CM | POA: Diagnosis not present

## 2018-05-29 MED ORDER — AFLIBERCEPT 2MG/0.05ML IZ SOLN FOR KALEIDOSCOPE
2.0000 mg | INTRAVITREAL | Status: AC | PRN
  Administered 2018-05-29: 2 mg via INTRAVITREAL

## 2018-05-29 MED ORDER — BEVACIZUMAB CHEMO INJECTION 1.25MG/0.05ML SYRINGE FOR KALEIDOSCOPE
1.2500 mg | INTRAVITREAL | Status: AC | PRN
  Administered 2018-05-29: 1.25 mg via INTRAVITREAL

## 2018-06-05 ENCOUNTER — Other Ambulatory Visit: Payer: Self-pay | Admitting: Family Medicine

## 2018-06-05 DIAGNOSIS — N183 Chronic kidney disease, stage 3 (moderate): Secondary | ICD-10-CM | POA: Diagnosis not present

## 2018-06-05 DIAGNOSIS — Z794 Long term (current) use of insulin: Principal | ICD-10-CM

## 2018-06-05 DIAGNOSIS — K219 Gastro-esophageal reflux disease without esophagitis: Secondary | ICD-10-CM | POA: Diagnosis not present

## 2018-06-05 DIAGNOSIS — E118 Type 2 diabetes mellitus with unspecified complications: Secondary | ICD-10-CM

## 2018-06-05 DIAGNOSIS — I5023 Acute on chronic systolic (congestive) heart failure: Secondary | ICD-10-CM | POA: Diagnosis not present

## 2018-06-05 DIAGNOSIS — E785 Hyperlipidemia, unspecified: Secondary | ICD-10-CM | POA: Diagnosis not present

## 2018-06-05 DIAGNOSIS — I2510 Atherosclerotic heart disease of native coronary artery without angina pectoris: Secondary | ICD-10-CM | POA: Diagnosis not present

## 2018-06-05 DIAGNOSIS — I255 Ischemic cardiomyopathy: Secondary | ICD-10-CM | POA: Diagnosis not present

## 2018-06-05 DIAGNOSIS — I130 Hypertensive heart and chronic kidney disease with heart failure and stage 1 through stage 4 chronic kidney disease, or unspecified chronic kidney disease: Secondary | ICD-10-CM | POA: Diagnosis not present

## 2018-06-05 DIAGNOSIS — E1122 Type 2 diabetes mellitus with diabetic chronic kidney disease: Secondary | ICD-10-CM | POA: Diagnosis not present

## 2018-06-05 DIAGNOSIS — I272 Pulmonary hypertension, unspecified: Secondary | ICD-10-CM | POA: Diagnosis not present

## 2018-06-05 DIAGNOSIS — Z9181 History of falling: Secondary | ICD-10-CM | POA: Diagnosis not present

## 2018-06-06 ENCOUNTER — Other Ambulatory Visit: Payer: Self-pay | Admitting: Family Medicine

## 2018-06-06 MED ORDER — PREGABALIN 225 MG PO CAPS: 225.0000 mg | ORAL_CAPSULE | Freq: Every day | ORAL | 2 refills | Status: DC

## 2018-06-08 ENCOUNTER — Other Ambulatory Visit: Payer: Self-pay

## 2018-06-08 ENCOUNTER — Ambulatory Visit (INDEPENDENT_AMBULATORY_CARE_PROVIDER_SITE_OTHER): Payer: Medicare Other | Admitting: Family Medicine

## 2018-06-08 ENCOUNTER — Encounter: Payer: Self-pay | Admitting: Family Medicine

## 2018-06-08 DIAGNOSIS — E1142 Type 2 diabetes mellitus with diabetic polyneuropathy: Secondary | ICD-10-CM | POA: Diagnosis not present

## 2018-06-08 DIAGNOSIS — Z794 Long term (current) use of insulin: Secondary | ICD-10-CM

## 2018-06-08 DIAGNOSIS — E162 Hypoglycemia, unspecified: Secondary | ICD-10-CM | POA: Diagnosis not present

## 2018-06-08 MED ORDER — INSULIN GLARGINE (2 UNIT DIAL) 300 UNIT/ML ~~LOC~~ SOPN: PEN_INJECTOR | SUBCUTANEOUS | 11 refills | Status: DC

## 2018-06-08 NOTE — Progress Notes (Signed)
Subjective:    Patient ID: Brad Mendoza, male    DOB: Jun 27, 1955, 63 y.o.   MRN: [id number]  CC: Diabetes   HPI: Brad Mendoza is a 63 y.o. male presenting for Diabetes  Follow-up of diabetes. Patient  checks blood sugar at home about 3 times a day. Fasting is 100-150 usually. Postprandials in ow 100s Patient denies symptoms such as polyuria, polydipsia, excessive hunger, nausea A few significant hypoglycemic spells noted. Pt. decreased him toujeo to 40 in the AM then deleted the AM going with only the 15 units in the evening. Taking the metformin and notes stools are loose. Occurring 2-3 times a day. Feels it is very effective for him.   Fasting glucose 155 this AM.  Depression screen [facility] 2/9 04/10/2018 01/25/2018 12/14/2016 12/02/2016 09/26/2016  Decreased Interest 0 0 1 0 0  Down, Depressed, Hopeless 0 0 0 0 0  PHQ - 2 Score 0 0 1 0 0  Some recent data might be hidden     Relevant past medical, surgical, family and social history reviewed and updated as indicated.  Interim medical history since our last visit reviewed. Allergies and medications reviewed and updated.  ROS:  Review of Systems  Constitutional: Negative.   HENT: Negative.   Eyes: Negative for visual disturbance.  Respiratory: Negative for cough and shortness of breath.   Cardiovascular: Negative for chest pain and leg swelling.  Gastrointestinal: Negative for abdominal pain, diarrhea, nausea and vomiting.  Genitourinary: Negative for difficulty urinating.  Musculoskeletal: Negative for arthralgias and myalgias.  Skin: Negative for rash.  Neurological: Negative for headaches.  Psychiatric/Behavioral: Negative for sleep disturbance.     Social History   Tobacco Use  Smoking Status Never Smoker  Smokeless Tobacco Current User  . Types: Snuff  Tobacco Comment   dips snuff about 6 cans/week for 3 years       Objective:     Wt Readings from Last 3 Encounters:  04/27/18 213 lb 4 oz (96.7 kg)   04/18/18 196 lb (88.9 kg)  04/10/18 211 lb 9.6 oz (96 kg)     Exam deferred. Pt. Harboring due to COVID 19. Phone visit performed.   Assessment & Plan:    Brad Mendoza was seen today for diabetes.  Diagnoses and all orders for this visit:  Type 2 diabetes mellitus with diabetic polyneuropathy, with long-term current use of insulin (HCC)  Hypoglycemia  Other orders -     Insulin Glargine, 2 Unit Dial, (TOUJEO MAX SOLOSTAR) 300 UNIT/ML SOPN; Inject 30 Units into the skin daily before breakfast AND 15 Units daily before supper.   Virtual Visit via telephone Note  I discussed the limitations, risks, security and privacy concerns of performing an evaluation and management service by telephone and the availability of in person appointments. The patient expressed understanding and agreed to proceed. Pt. Is at home. Dr. Livia Snellen is in his office.  Follow Up Instructions:   I discussed the assessment and treatment plan with the patient. The patient was provided an opportunity to ask questions and all were answered. The patient agreed with the plan and demonstrated an understanding of the instructions.   The patient was advised to call back or seek an in-person evaluation if the symptoms worsen or if the condition fails to improve as anticipated.  Visit started: 3:40 Call ended:  3:57 Total minutes including chart review and phone contact time: 27   Follow up plan: Return in about 6 weeks (around 07/20/2018).  Brad Mendoza  Livia Snellen, MD [facility] 06/08/2018, 8:59 PM

## 2018-06-11 ENCOUNTER — Other Ambulatory Visit: Payer: Self-pay

## 2018-06-11 NOTE — Patient Outreach (Addendum)
[facility]) Care Management  06/11/2018   Brad Mendoza 03-22-1955 [id number]  Subjective: Successful call to the patient.  HIPAA verified.  The patient states that he has been doing well.  He denies any chest pain, shortness of breath, swelling, or falls.  He states that he has been feeling fatigued and had some dizzy spells.  He states that he has had Lyrica and Relafen added to his medications and they might be making him feel that way.  He states that he will be calling to set up an appointment with his physician this week and will talk with him.    His weight today was 207 lbs.  He is weighing daily.  Encouraged the patient to continue weighing.  He states that he is taking his medications and he has food in the home.  He states that he has rarely leaves the home but if he does he is wearing a mask.    Current Medications:  Current Outpatient Medications  Medication Sig Dispense Refill  . aspirin EC 81 MG tablet Take 81 mg by mouth daily.    . Capsaicin (ICY HOT ARTHRITIS THERAPY EX) Apply 1 application topically 2 (two) times daily as needed (back pain).     . carvedilol (COREG) 6.25 MG tablet Take 1 tablet (6.25 mg total) by mouth 2 (two) times daily with a meal. 60 tablet 5  . Cholecalciferol (VITAMIN D3) 25 MCG (1000 UT) CAPS Take 2,000 Units by mouth daily.    . empagliflozin (JARDIANCE) 10 MG TABS tablet Take 10 mg by mouth daily. 30 tablet 6  . fluticasone (FLONASE) 50 MCG/ACT nasal spray USE 2 SPRAYS IN EACH NOSTRIL DAILY (Patient taking differently: Place 1 spray into both nostrils daily as needed for allergies or rhinitis. ) 48 g 1  . hydrALAZINE (APRESOLINE) 100 MG tablet Take 1 tablet (100 mg total) by mouth 3 (three) times daily. 90 tablet 5  . Insulin Glargine, 2 Unit Dial, (TOUJEO MAX SOLOSTAR) 300 UNIT/ML SOPN Inject 30 Units into the skin daily before breakfast AND 15 Units daily before supper. 6 pen 11  . isosorbide mononitrate (IMDUR) 120 MG 24 hr  tablet Take 1 tablet (120 mg total) by mouth daily. 30 tablet 5  . metFORMIN (GLUCOPHAGE-XR) 750 MG 24 hr tablet TAKE 1 TABLET EVERY MORNING WITH BREAKFAST 90 tablet 0  . metolazone (ZAROXOLYN) 5 MG tablet Take 1 tablet (5 mg total) by mouth 2 (two) times a week. Take on Mondays and Fridays 10 tablet 5  . Multiple Vitamins-Minerals (CENTRUM SILVER ADULT 50+ PO) Take 1 tablet by mouth daily.    . nabumetone (RELAFEN) 500 MG tablet Take 2 tablets (1,000 mg total) by mouth 2 (two) times daily. For muscle and joint pain 360 tablet 1  . nitroGLYCERIN (NITROSTAT) 0.4 MG SL tablet DISSOLVE 1 TAB UNDER TOUNGE FOR CHEST PAIN. MAY REPEAT EVERY 5 MINUTES FOR 3 DOSES. IF NO RELIEF CALL 911 OR GO TO ER (Patient taking differently: Place 0.4 mg under the tongue every 5 (five) minutes as needed for chest pain. ) 25 tablet 2  . Omega-3 Fatty Acids (FISH OIL) 1000 MG CPDR Take 1,000 mg by mouth daily.     [name] VERIO test strip USE FOUR TIMES DAILY AS NEEDED 100 each 11  . pantoprazole (PROTONIX) 40 MG tablet Take 1 tablet (40 mg total) by mouth daily. 90 tablet 1  . pregabalin (LYRICA) 225 MG capsule Take 1 capsule (225 mg total) by mouth  at bedtime. 30 capsule 2  . PROAIR HFA 108 (90 Base) MCG/ACT inhaler USE 2 PUFFS EVERY 6 HOURS AS NEEDED 8.5 g 3  . ranolazine (RANEXA) 500 MG 12 hr tablet TAKE ONE TABLET BY MOUTH TWICE DAILY (Patient taking differently: Take 500 mg by mouth 2 (two) times daily. ) 60 tablet 5  . rosuvastatin (CRESTOR) 10 MG tablet Take 1 tablet (10 mg total) by mouth daily. 90 tablet 1  . terbinafine (LAMISIL) 1 % cream Apply 1 application topically 2 (two) times daily. Apply to both feet and between toes 30 g 1  . torsemide (DEMADEX) 20 MG tablet Take 3 tablets (60 mg total) by mouth daily. 90 tablet 5   Current Facility-Administered Medications  Medication Dose Route Frequency Provider Last Rate Last Dose  . aflibercept (EYLEA) SOLN 2 mg  2 mg Intravitreal  [name], MD   2 mg at  04/25/18 0029    Functional Status:  In your present state of health, do you have any difficulty performing the following activities: 03/20/2018 03/20/2018  Hearing? - N  Vision? - N  Difficulty concentrating or making decisions? - N  Walking or climbing stairs? - N  Dressing or bathing? - N  Doing errands, shopping? N -  Some recent data might be hidden    Fall/Depression Screening: Fall Risk  06/11/2018 04/10/2018 04/02/2018  Falls in the past year? 0 0 0  Comment - - -  Number falls in past yr: - - -  Injury with Fall? - - -  Comment - - -  Risk for fall due to : - - -  Follow up - - -   PHQ 2/9 Scores 04/10/2018 01/25/2018 12/14/2016 12/02/2016 09/26/2016 09/14/2016 09/01/2016  PHQ - 2 Score 0 0 1 0 0 0 0    Assessment: Patient will continue to benefit from health coach outreach for disease management and support.  THN CM Care Plan Problem One     Most Recent Value  Care Plan for Problem One  Active  THN Long Term Goal   In 90 days the patient will verbalize that he is staying out of the hospital for chf  [facility] Long Term Goal Start Date  06/11/18  [facility] Long Term Goal Met Date  12/13/16 Barrie Folk met]  Interventions for Problem One Long Term Goal  Verified the patients understanding on the improtance of monitoring CHF, reviewed signs and syptoms of CHF and zones, Encouraged medication adherence and discussed covid-19 precautionary methods  [facility] CM Short Term Goal #4  Client will communicate with CSW in next 30 days about financial challenges of client and about financial resources for clinet in the area.   THN CM Short Term Goal #4 Start Date  06/20/17  [facility] CM Short Term Goal #4 Met Date  07/21/17  Interventions for Short Term Goal #4  goal met. CSW have spoken wiht client about setting up monthly budget for client. CSW have talked with clinet about resources at [employer] pantries and transport agencies.  CSW have talked with client about setting up monthly payment plan with businesses when  he may owe business money.   THN CM Short Term Goal #5   client will communicate with CSW in next 30 days to discuss financial needs of clinet and to discuss financial resources for clinet in the area.   THN CM Short Term Goal #5 Start Date  02/16/17  [facility] CM Short Term Goal #5 Met Date  03/20/17  [facility] CM Care Plan Problem Two     Most Recent Value  Care Plan Problem Two  Knowledge deficit related to diabetes  Role Documenting the Problem Two  Care Management Coordinator  Care Plan for Problem Two  Active     Plan: RN Health Coach will contact patient in the month of July and patient agrees to next outreach.   [name] RN, BSN, [name] Direct Dial:  [phone]  Fax: [phone]

## 2018-06-12 DIAGNOSIS — I2510 Atherosclerotic heart disease of native coronary artery without angina pectoris: Secondary | ICD-10-CM | POA: Diagnosis not present

## 2018-06-12 DIAGNOSIS — Z9181 History of falling: Secondary | ICD-10-CM | POA: Diagnosis not present

## 2018-06-12 DIAGNOSIS — I255 Ischemic cardiomyopathy: Secondary | ICD-10-CM | POA: Diagnosis not present

## 2018-06-12 DIAGNOSIS — E785 Hyperlipidemia, unspecified: Secondary | ICD-10-CM | POA: Diagnosis not present

## 2018-06-12 DIAGNOSIS — K219 Gastro-esophageal reflux disease without esophagitis: Secondary | ICD-10-CM | POA: Diagnosis not present

## 2018-06-12 DIAGNOSIS — I130 Hypertensive heart and chronic kidney disease with heart failure and stage 1 through stage 4 chronic kidney disease, or unspecified chronic kidney disease: Secondary | ICD-10-CM | POA: Diagnosis not present

## 2018-06-12 DIAGNOSIS — I272 Pulmonary hypertension, unspecified: Secondary | ICD-10-CM | POA: Diagnosis not present

## 2018-06-12 DIAGNOSIS — I5023 Acute on chronic systolic (congestive) heart failure: Secondary | ICD-10-CM | POA: Diagnosis not present

## 2018-06-12 DIAGNOSIS — N183 Chronic kidney disease, stage 3 (moderate): Secondary | ICD-10-CM | POA: Diagnosis not present

## 2018-06-12 DIAGNOSIS — E1122 Type 2 diabetes mellitus with diabetic chronic kidney disease: Secondary | ICD-10-CM | POA: Diagnosis not present

## 2018-06-12 DIAGNOSIS — Z794 Long term (current) use of insulin: Secondary | ICD-10-CM | POA: Diagnosis not present

## 2018-06-14 ENCOUNTER — Other Ambulatory Visit: Payer: Self-pay

## 2018-06-14 ENCOUNTER — Ambulatory Visit (HOSPITAL_COMMUNITY)
Admission: RE | Admit: 2018-06-14 | Discharge: 2018-06-14 | Disposition: A | Discharge status: Home / Self Care | Payer: Medicare Other | Source: Ambulatory Visit | Attending: Internal Medicine | Admitting: Internal Medicine

## 2018-06-14 ENCOUNTER — Telehealth (HOSPITAL_COMMUNITY): Payer: Self-pay | Admitting: Licensed Clinical Social Worker

## 2018-06-14 DIAGNOSIS — E1142 Type 2 diabetes mellitus with diabetic polyneuropathy: Secondary | ICD-10-CM

## 2018-06-14 DIAGNOSIS — I5022 Chronic systolic (congestive) heart failure: Secondary | ICD-10-CM

## 2018-06-14 DIAGNOSIS — Z794 Long term (current) use of insulin: Secondary | ICD-10-CM

## 2018-06-14 DIAGNOSIS — I2510 Atherosclerotic heart disease of native coronary artery without angina pectoris: Secondary | ICD-10-CM

## 2018-06-14 NOTE — Addendum Note (Signed)
Encounter addended by: Scarlette Calico, RN on: 06/14/2018 2:15 PM  Actions taken: Clinical Note Signed, Order list changed, Diagnosis association updated

## 2018-06-14 NOTE — Progress Notes (Signed)
Heart Failure TeleHealth Note  Due to national recommendations of social distancing due to [place] 19, Audio/video telehealth visit is felt to be most appropriate for this patient at this time.  See MyChart message from today for patient consent regarding telehealth for Brad Mendoza.  Date:  06/14/2018   ID:  Brad Mendoza, DOB 02-12-1956, MRN [id number]  Location: Home  Provider location: [place] [facility] Type of Visit: Established patient  PCP:  [name], MD  Cardiologist:  No primary care provider on file. Primary HF: [place]  Chief Complaint: Heart Failure follow-up   History of Present Illness: Brad Mendoza is a 63 y.o. male with a past medical history of systolic HF due to NICM, HTN, HLD, DM and CAD.   He was admitted from cath lab 03/19/18 with volume overload. Cath showed 90% RCA stenosis (worsened) and he underwent PCI/DES to RCA by Dr. Ellyn Hack. He was started on Brilinta in addition to his home ASA. Brilinta was provided through [facility]. HF team was consulted due to cardiorenal syndrome. He was started on milrinone 0.125 mcg/kg/min to augment diuresis. He diuresed with high dose IV lasix and metolazone and transitioned to torsemide 60 mg daily + metolazone 5 mg on Mondays and Fridays. ReDS clip reading improved from 45% to 33%. He was set up with [facility] HH vest protocol for home. HF medications optimized. Entresto and spiro held with AKI. Coreg decreased with need for milrinone.  DC weight 206 lbs.  Seen in ED 2/26 with high blood sugars 400-500. Given IVF and Toujeo adjusted with instructions to follow up with PCP.  He presents via Engineer, civil (consulting) for a telehealth visit today. Says he is doing pretty well. Does all ADLs without problem. Mild orthopnea. Says  He still has [facility] coming out 1x/week with REDS vest and last reading was the 'lowest ever'. No CP, edema, orthopnea or PND. No problems with medications. No dizziness.   Brad Mendoza denies symptoms worrisome for COVID 19.   Past Medical History:  Diagnosis Date  . Acid reflux   . Back pain    f4 and f5  . CAD (coronary artery disease)   . CHF (congestive heart failure) ([place])    last echo was in March 2016  . Diabetes mellitus 2005  . Diverticulosis   . H/O chest pain 2005   ECHO  . HTN (hypertension)   . Hyperlipidemia   . Nonischemic cardiomyopathy ([place])   . Onychomycosis   . Polysubstance abuse [facility])    Past Surgical History:  Procedure Laterality Date  . APPENDECTOMY    . CARDIAC CATHETERIZATION  2005   4 frech cath  . COLONOSCOPY N/A 08/01/2013   Procedure: COLONOSCOPY;  Surgeon: Rogene Houston, MD;  [facility];  Service: Endoscopy;  Laterality: N/A;  rescheduled to [place] notified pt  . CORONARY STENT INTERVENTION N/A 03/19/2018   Procedure: CORONARY STENT INTERVENTION;  Surgeon: Leonie Man, MD;  Location: [place] CV LAB;  Service: Cardiovascular;  Laterality: N/A;  . ESOPHAGOGASTRODUODENOSCOPY N/A 04/23/2015   Procedure: ESOPHAGOGASTRODUODENOSCOPY (EGD);  Surgeon: Rogene Houston, MD;  [facility]: [facility];  Service: Endoscopy;  Laterality: N/A;  1:25  . RIGHT/LEFT HEART CATH AND CORONARY ANGIOGRAPHY N/A 06/03/2016   Procedure: Right/Left Heart Cath and Coronary Angiography;  Surgeon: Belva Crome, MD;  Location: [place] CV LAB;  Service: Cardiovascular;  Laterality: N/A;  . RIGHT/LEFT HEART CATH AND CORONARY ANGIOGRAPHY N/A  03/19/2018   Procedure: RIGHT/LEFT HEART CATH AND CORONARY ANGIOGRAPHY;  Surgeon: Jolaine Artist, MD;  Location: [place] [facility] LAB;  Service: Cardiovascular;  Laterality: N/A;     Current Outpatient Medications  Medication Sig Dispense Refill  . aspirin EC 81 MG tablet Take 81 mg by mouth daily.    . Capsaicin (ICY HOT ARTHRITIS THERAPY EX) Apply 1 application topically 2 (two) times daily as needed (back pain).     . carvedilol (COREG) 6.25 MG tablet Take 1 tablet (6.25 mg  total) by mouth 2 (two) times daily with a meal. 60 tablet 5  . Cholecalciferol (VITAMIN D3) 25 MCG (1000 UT) CAPS Take 2,000 Units by mouth daily.    . empagliflozin (JARDIANCE) 10 MG TABS tablet Take 10 mg by mouth daily. 30 tablet 6  . fluticasone (FLONASE) 50 MCG/ACT nasal spray USE 2 SPRAYS IN EACH NOSTRIL DAILY (Patient taking differently: Place 1 spray into both nostrils daily as needed for allergies or rhinitis. ) 48 g 1  . hydrALAZINE (APRESOLINE) 100 MG tablet Take 1 tablet (100 mg total) by mouth 3 (three) times daily. 90 tablet 5  . Insulin Glargine, 2 Unit Dial, (TOUJEO MAX SOLOSTAR) 300 UNIT/ML SOPN Inject 30 Units into the skin daily before breakfast AND 15 Units daily before supper. 6 pen 11  . isosorbide mononitrate (IMDUR) 120 MG 24 hr tablet Take 1 tablet (120 mg total) by mouth daily. 30 tablet 5  . metFORMIN (GLUCOPHAGE-XR) 750 MG 24 hr tablet TAKE 1 TABLET EVERY MORNING WITH BREAKFAST 90 tablet 0  . metolazone (ZAROXOLYN) 5 MG tablet Take 1 tablet (5 mg total) by mouth 2 (two) times a week. Take on Mondays and Fridays 10 tablet 5  . Multiple Vitamins-Minerals (CENTRUM SILVER ADULT 50+ PO) Take 1 tablet by mouth daily.    . nabumetone (RELAFEN) 500 MG tablet Take 2 tablets (1,000 mg total) by mouth 2 (two) times daily. For muscle and joint pain 360 tablet 1  . nitroGLYCERIN (NITROSTAT) 0.4 MG SL tablet DISSOLVE 1 TAB UNDER TOUNGE FOR CHEST PAIN. MAY REPEAT EVERY 5 MINUTES FOR 3 DOSES. IF NO RELIEF CALL 911 OR GO TO ER (Patient taking differently: Place 0.4 mg under the tongue every 5 (five) minutes as needed for chest pain. ) 25 tablet 2  . Omega-3 Fatty Acids (FISH OIL) 1000 MG CPDR Take 1,000 mg by mouth daily.     [name] VERIO test strip USE FOUR TIMES DAILY AS NEEDED 100 each 11  . pantoprazole (PROTONIX) 40 MG tablet Take 1 tablet (40 mg total) by mouth daily. 90 tablet 1  . pregabalin (LYRICA) 225 MG capsule Take 1 capsule (225 mg total) by mouth at bedtime. 30 capsule 2   . PROAIR HFA 108 (90 Base) MCG/ACT inhaler USE 2 PUFFS EVERY 6 HOURS AS NEEDED 8.5 g 3  . ranolazine (RANEXA) 500 MG 12 hr tablet TAKE ONE TABLET BY MOUTH TWICE DAILY (Patient taking differently: Take 500 mg by mouth 2 (two) times daily. ) 60 tablet 5  . rosuvastatin (CRESTOR) 10 MG tablet Take 1 tablet (10 mg total) by mouth daily. 90 tablet 1  . terbinafine (LAMISIL) 1 % cream Apply 1 application topically 2 (two) times daily. Apply to both feet and between toes 30 g 1  . torsemide (DEMADEX) 20 MG tablet Take 3 tablets (60 mg total) by mouth daily. 90 tablet 5   Current Facility-Administered Medications  Medication Dose Route Frequency Provider Last Rate Last Dose  .  aflibercept (EYLEA) SOLN 2 mg  2 mg Intravitreal  [name], MD   2 mg at 04/25/18 0029    Allergies:   Sulfa antibiotics and Penicillins   Social History:  The patient  reports that he has never smoked. His smokeless tobacco use includes snuff. He reports current alcohol use of about 1.0 standard drinks of alcohol per week. He reports current drug use. Drug: Marijuana.   Family History:  The patient's family history includes Alcohol abuse in his brother; Cancer in his mother; Diabetes in his father; Hypertension in his father.   ROS:  Please see the history of present illness.   All other systems are personally reviewed and negative.   Exam:  (Video/Tele Health Call; Exam is subjective and or/visual.) General:  Speaks in full sentences. No resp difficulty. Lungs: Normal respiratory effort with conversation.  Abdomen: Non-distended per patient report Extremities: Pt denies edema. Neuro: Alert & oriented x 3.   Recent Labs: 03/26/2018: Magnesium 2.2 04/10/2018: ALT 28; BNP 133.3 04/18/2018: Hemoglobin 11.2; Platelets 214 04/19/2018: BUN 80; Creatinine, Ser 2.79; Potassium 3.7; Sodium 134  Personally reviewed   Wt Readings from Last 3 Encounters:  04/27/18 96.7 kg (213 lb 4 oz)  04/18/18 88.9 kg (196 lb)  04/10/18 96  kg (211 lb 9.6 oz)      ASSESSMENT AND PLAN:  1. Acute on chronic systolic CHF: Mixed ICM and NICM. He had preexisting NICM diagnosed in 2005 when his EF was 35% and had normal cors. Echo in 05/2016 with EF 30-35% and cath showed 70-75% stenosis in the proximal and mid LAD,90% ostial stenosis in the 1st diagonal, and 60-70% stenosis in the mid RCA. [place] 05/2016- Fick C.O. 5.86/index 2.74 - Echo from 4/19 reviewed personally EF read as 40-45% but it is more like 25% - Echo1/20/20EF 30-35% - Continue torsemide 40 mg daily - Continue coreg 6.25 mg BID.  - Continue imdur 120 mg daily.   - Continue hydralazine 100 mg TID Arlyce Harman on hold with AKI.  Delene Loll has been on hold with CKD (last creatinine 2.2-> 2.8)  - Willeventually need ICD if EF remains < 35% post PCI in 3 months - No change to meds today. Will have [facility] draw labs next week - Has food insecurity - will enroll in [place] program  - F/u telehealth visit 1 month - F/u clinic 2-3 months with echo. ? Need for ICD   2. CAD -s/p PCI/DES of RCA 03/19/2018. Other CAD stable. Manage medically - No s/s ischemia  - Continue isosorbide as above.  - Continue Ranexa. - ContinueDAPTand statin  3. ETOH abuse - Congratulated him on cessation.   4. Tobacco abuse - Chews tobacco.  - Encouraged complete cessation.   5. HTN - Well controlled now   67. CKD stage III - Baseline creatinine 1.7-2.2. Most recently 2.5-2.8 - Follows with Dr. Hinda Lenis - Will have [facility] draw labs next week  7. DM -Per PCP. - Avoiding SGLT2i with CRCl < 30   8. Fatigue/snoring - Will do home sleep study   COVID screen The patient does not have any symptoms that suggest any further testing/ screening at this time.  Social distancing reinforced today.  Recommended follow-up:  As above  Relevant cardiac medications were reviewed at length with the patient today.   The patient does not have concerns regarding their medications at  this time.   The following changes were made today:  As above  Today, I have spent 18  minutes with the patient with telehealth technology discussing the above issues .    Signed, Glori Bickers, MD  12:50 PM   Advanced [facility] Failure [place] [address] Heart and [place] [address] [phone] (office) [phone] (fax)

## 2018-06-14 NOTE — Progress Notes (Signed)
Spoke w/pt via phone.  He states Kennyth Lose, CSW just spoke with him about food delivery and he is very thankful for that service.  He states his [facility] nurse is sch to come out on Tue 4/28, order faxed to them to draw labs per Dr Haroldine Laws.  Message sent to schedulers to sch next appts.

## 2018-06-14 NOTE — Telephone Encounter (Signed)
CSW contacted patient to inquire about food insecurity during this health crisis. Patient reported need and agreeable to weekly delivery from [employer]. Patient informed that delivery will be left on front door with no face to face contact with delivery person. Patient agreeable to plan and grateful for the assistance. Jackie Jayln Branscom, LCSW, CCSW-MCS [phone] 

## 2018-06-19 DIAGNOSIS — Z794 Long term (current) use of insulin: Secondary | ICD-10-CM | POA: Diagnosis not present

## 2018-06-19 DIAGNOSIS — I130 Hypertensive heart and chronic kidney disease with heart failure and stage 1 through stage 4 chronic kidney disease, or unspecified chronic kidney disease: Secondary | ICD-10-CM | POA: Diagnosis not present

## 2018-06-19 DIAGNOSIS — E1122 Type 2 diabetes mellitus with diabetic chronic kidney disease: Secondary | ICD-10-CM | POA: Diagnosis not present

## 2018-06-19 DIAGNOSIS — I272 Pulmonary hypertension, unspecified: Secondary | ICD-10-CM | POA: Diagnosis not present

## 2018-06-19 DIAGNOSIS — I2510 Atherosclerotic heart disease of native coronary artery without angina pectoris: Secondary | ICD-10-CM | POA: Diagnosis not present

## 2018-06-19 DIAGNOSIS — E785 Hyperlipidemia, unspecified: Secondary | ICD-10-CM | POA: Diagnosis not present

## 2018-06-19 DIAGNOSIS — K219 Gastro-esophageal reflux disease without esophagitis: Secondary | ICD-10-CM | POA: Diagnosis not present

## 2018-06-19 DIAGNOSIS — I255 Ischemic cardiomyopathy: Secondary | ICD-10-CM | POA: Diagnosis not present

## 2018-06-19 DIAGNOSIS — Z9181 History of falling: Secondary | ICD-10-CM | POA: Diagnosis not present

## 2018-06-19 DIAGNOSIS — N183 Chronic kidney disease, stage 3 (moderate): Secondary | ICD-10-CM | POA: Diagnosis not present

## 2018-06-19 DIAGNOSIS — I5023 Acute on chronic systolic (congestive) heart failure: Secondary | ICD-10-CM | POA: Diagnosis not present

## 2018-06-20 ENCOUNTER — Other Ambulatory Visit: Payer: Self-pay | Admitting: Pediatrics

## 2018-06-20 DIAGNOSIS — I10 Essential (primary) hypertension: Secondary | ICD-10-CM

## 2018-06-22 ENCOUNTER — Telehealth (HOSPITAL_COMMUNITY): Payer: Self-pay | Admitting: Licensed Clinical Social Worker

## 2018-06-22 NOTE — Telephone Encounter (Signed)
CSW contacted patient to follow up on weekly food delivery package. Patient informed of delivery time and no face to face contact during delivery. Message left as no answer.  CSW continues to follow for supportive needs. Jackie Dhalia Zingaro, LCSW, CCSW-MCS [phone] 

## 2018-06-26 ENCOUNTER — Encounter (INDEPENDENT_AMBULATORY_CARE_PROVIDER_SITE_OTHER): Payer: Medicare Other | Admitting: Ophthalmology

## 2018-06-26 DIAGNOSIS — I255 Ischemic cardiomyopathy: Secondary | ICD-10-CM | POA: Diagnosis not present

## 2018-06-26 DIAGNOSIS — I5023 Acute on chronic systolic (congestive) heart failure: Secondary | ICD-10-CM | POA: Diagnosis not present

## 2018-06-26 DIAGNOSIS — I2510 Atherosclerotic heart disease of native coronary artery without angina pectoris: Secondary | ICD-10-CM | POA: Diagnosis not present

## 2018-06-26 DIAGNOSIS — Z9181 History of falling: Secondary | ICD-10-CM | POA: Diagnosis not present

## 2018-06-26 DIAGNOSIS — I272 Pulmonary hypertension, unspecified: Secondary | ICD-10-CM | POA: Diagnosis not present

## 2018-06-26 DIAGNOSIS — E785 Hyperlipidemia, unspecified: Secondary | ICD-10-CM | POA: Diagnosis not present

## 2018-06-26 DIAGNOSIS — I130 Hypertensive heart and chronic kidney disease with heart failure and stage 1 through stage 4 chronic kidney disease, or unspecified chronic kidney disease: Secondary | ICD-10-CM | POA: Diagnosis not present

## 2018-06-26 DIAGNOSIS — E1122 Type 2 diabetes mellitus with diabetic chronic kidney disease: Secondary | ICD-10-CM | POA: Diagnosis not present

## 2018-06-26 DIAGNOSIS — Z794 Long term (current) use of insulin: Secondary | ICD-10-CM | POA: Diagnosis not present

## 2018-06-26 DIAGNOSIS — K219 Gastro-esophageal reflux disease without esophagitis: Secondary | ICD-10-CM | POA: Diagnosis not present

## 2018-06-26 DIAGNOSIS — N183 Chronic kidney disease, stage 3 (moderate): Secondary | ICD-10-CM | POA: Diagnosis not present

## 2018-06-29 ENCOUNTER — Telehealth (HOSPITAL_COMMUNITY): Payer: Self-pay | Admitting: Licensed Clinical Social Worker

## 2018-06-29 NOTE — Telephone Encounter (Signed)
CSW contacted patient to follow up on weekly food delivery package. Patient informed of delivery time and no face to face contact during delivery. Message left as no answer.  CSW continues to follow for supportive needs. Jackie Yacob Wilkerson, LCSW, CCSW-MCS [phone] 

## 2018-07-02 ENCOUNTER — Other Ambulatory Visit (HOSPITAL_COMMUNITY): Payer: Self-pay | Admitting: Internal Medicine

## 2018-07-03 DIAGNOSIS — N183 Chronic kidney disease, stage 3 (moderate): Secondary | ICD-10-CM | POA: Diagnosis not present

## 2018-07-03 DIAGNOSIS — K219 Gastro-esophageal reflux disease without esophagitis: Secondary | ICD-10-CM | POA: Diagnosis not present

## 2018-07-03 DIAGNOSIS — Z794 Long term (current) use of insulin: Secondary | ICD-10-CM | POA: Diagnosis not present

## 2018-07-03 DIAGNOSIS — E1122 Type 2 diabetes mellitus with diabetic chronic kidney disease: Secondary | ICD-10-CM | POA: Diagnosis not present

## 2018-07-03 DIAGNOSIS — I272 Pulmonary hypertension, unspecified: Secondary | ICD-10-CM | POA: Diagnosis not present

## 2018-07-03 DIAGNOSIS — I2510 Atherosclerotic heart disease of native coronary artery without angina pectoris: Secondary | ICD-10-CM | POA: Diagnosis not present

## 2018-07-03 DIAGNOSIS — Z9181 History of falling: Secondary | ICD-10-CM | POA: Diagnosis not present

## 2018-07-03 DIAGNOSIS — E785 Hyperlipidemia, unspecified: Secondary | ICD-10-CM | POA: Diagnosis not present

## 2018-07-03 DIAGNOSIS — I255 Ischemic cardiomyopathy: Secondary | ICD-10-CM | POA: Diagnosis not present

## 2018-07-03 DIAGNOSIS — I5023 Acute on chronic systolic (congestive) heart failure: Secondary | ICD-10-CM | POA: Diagnosis not present

## 2018-07-03 DIAGNOSIS — I130 Hypertensive heart and chronic kidney disease with heart failure and stage 1 through stage 4 chronic kidney disease, or unspecified chronic kidney disease: Secondary | ICD-10-CM | POA: Diagnosis not present

## 2018-07-06 ENCOUNTER — Telehealth (HOSPITAL_COMMUNITY): Payer: Self-pay | Admitting: Licensed Clinical Social Worker

## 2018-07-06 ENCOUNTER — Other Ambulatory Visit: Payer: Self-pay

## 2018-07-06 NOTE — Telephone Encounter (Signed)
CSW contacted patient to follow up on weekly food delivery package. Patient informed of delivery time and no face to face contact during delivery. Patient verbalizes understanding and grateful for the assistance.  CSW continues to follow for supportive needs. Jackie Paeton Latouche, LCSW, CCSW-MCS [phone]  

## 2018-07-09 ENCOUNTER — Ambulatory Visit: Payer: Medicare Other | Admitting: Family Medicine

## 2018-07-10 DIAGNOSIS — K219 Gastro-esophageal reflux disease without esophagitis: Secondary | ICD-10-CM | POA: Diagnosis not present

## 2018-07-10 DIAGNOSIS — I5023 Acute on chronic systolic (congestive) heart failure: Secondary | ICD-10-CM | POA: Diagnosis not present

## 2018-07-10 DIAGNOSIS — E785 Hyperlipidemia, unspecified: Secondary | ICD-10-CM | POA: Diagnosis not present

## 2018-07-10 DIAGNOSIS — I272 Pulmonary hypertension, unspecified: Secondary | ICD-10-CM | POA: Diagnosis not present

## 2018-07-10 DIAGNOSIS — Z9181 History of falling: Secondary | ICD-10-CM | POA: Diagnosis not present

## 2018-07-10 DIAGNOSIS — Z794 Long term (current) use of insulin: Secondary | ICD-10-CM | POA: Diagnosis not present

## 2018-07-10 DIAGNOSIS — N183 Chronic kidney disease, stage 3 (moderate): Secondary | ICD-10-CM | POA: Diagnosis not present

## 2018-07-10 DIAGNOSIS — I130 Hypertensive heart and chronic kidney disease with heart failure and stage 1 through stage 4 chronic kidney disease, or unspecified chronic kidney disease: Secondary | ICD-10-CM | POA: Diagnosis not present

## 2018-07-10 DIAGNOSIS — E1122 Type 2 diabetes mellitus with diabetic chronic kidney disease: Secondary | ICD-10-CM | POA: Diagnosis not present

## 2018-07-10 DIAGNOSIS — I2510 Atherosclerotic heart disease of native coronary artery without angina pectoris: Secondary | ICD-10-CM | POA: Diagnosis not present

## 2018-07-10 DIAGNOSIS — I255 Ischemic cardiomyopathy: Secondary | ICD-10-CM | POA: Diagnosis not present

## 2018-07-12 ENCOUNTER — Telehealth (HOSPITAL_COMMUNITY): Payer: Self-pay | Admitting: Licensed Clinical Social Worker

## 2018-07-12 ENCOUNTER — Ambulatory Visit: Payer: Medicare Other

## 2018-07-12 NOTE — Telephone Encounter (Signed)
CSW contacted patient to follow up on weekly food delivery package. Patient informed of change in delivery day due to [facility] Day to Wednesday and no face to face contact during delivery. Patient verbalizes understanding and grateful for the assistance.  CSW continues to follow for supportive needs. Raquel Sarna, [place], [place]

## 2018-07-17 DIAGNOSIS — E1122 Type 2 diabetes mellitus with diabetic chronic kidney disease: Secondary | ICD-10-CM | POA: Diagnosis not present

## 2018-07-17 DIAGNOSIS — I5023 Acute on chronic systolic (congestive) heart failure: Secondary | ICD-10-CM | POA: Diagnosis not present

## 2018-07-17 DIAGNOSIS — I2510 Atherosclerotic heart disease of native coronary artery without angina pectoris: Secondary | ICD-10-CM | POA: Diagnosis not present

## 2018-07-17 DIAGNOSIS — Z9181 History of falling: Secondary | ICD-10-CM | POA: Diagnosis not present

## 2018-07-17 DIAGNOSIS — E785 Hyperlipidemia, unspecified: Secondary | ICD-10-CM | POA: Diagnosis not present

## 2018-07-17 DIAGNOSIS — K219 Gastro-esophageal reflux disease without esophagitis: Secondary | ICD-10-CM | POA: Diagnosis not present

## 2018-07-17 DIAGNOSIS — I130 Hypertensive heart and chronic kidney disease with heart failure and stage 1 through stage 4 chronic kidney disease, or unspecified chronic kidney disease: Secondary | ICD-10-CM | POA: Diagnosis not present

## 2018-07-17 DIAGNOSIS — Z794 Long term (current) use of insulin: Secondary | ICD-10-CM | POA: Diagnosis not present

## 2018-07-17 DIAGNOSIS — I272 Pulmonary hypertension, unspecified: Secondary | ICD-10-CM | POA: Diagnosis not present

## 2018-07-17 DIAGNOSIS — N183 Chronic kidney disease, stage 3 (moderate): Secondary | ICD-10-CM | POA: Diagnosis not present

## 2018-07-17 DIAGNOSIS — I255 Ischemic cardiomyopathy: Secondary | ICD-10-CM | POA: Diagnosis not present

## 2018-07-18 ENCOUNTER — Ambulatory Visit (HOSPITAL_COMMUNITY)
Admission: RE | Admit: 2018-07-18 | Discharge: 2018-07-18 | Disposition: A | Discharge status: Home / Self Care | Payer: Medicare Other | Source: Ambulatory Visit | Attending: Internal Medicine | Admitting: Internal Medicine

## 2018-07-18 ENCOUNTER — Encounter (HOSPITAL_COMMUNITY): Payer: Self-pay

## 2018-07-18 ENCOUNTER — Other Ambulatory Visit: Payer: Self-pay

## 2018-07-18 VITALS — Wt 210.0 lb

## 2018-07-18 DIAGNOSIS — R0683 Snoring: Secondary | ICD-10-CM

## 2018-07-18 DIAGNOSIS — I2510 Atherosclerotic heart disease of native coronary artery without angina pectoris: Secondary | ICD-10-CM

## 2018-07-18 DIAGNOSIS — N183 Chronic kidney disease, stage 3 unspecified: Secondary | ICD-10-CM

## 2018-07-18 DIAGNOSIS — I5022 Chronic systolic (congestive) heart failure: Secondary | ICD-10-CM

## 2018-07-18 DIAGNOSIS — F17200 Nicotine dependence, unspecified, uncomplicated: Secondary | ICD-10-CM

## 2018-07-18 MED ORDER — TORSEMIDE 20 MG PO TABS: 60.0000 mg | ORAL_TABLET | Freq: Every day | ORAL | 5 refills | Status: DC

## 2018-07-18 NOTE — Progress Notes (Signed)
Heart Failure TeleHealth Note  Due to national recommendations of social distancing due to [place] 19, Audio/video telehealth visit is felt to be most appropriate for this patient at this time.  See MyChart message from today for patient consent regarding telehealth for [facility].  Date:  07/18/2018   ID:  [name], DOB 24-Aug-1955, MRN [id number]  Location: Home  Provider location:  Advanced Heart Failure Type of Visit: Established patient  PCP:  [name], MD  Cardiologist:  No primary care provider on file. Primary HF: Dr Haroldine Laws   Chief Complaint: Heart Failure   History of Present Illness: Brad Mendoza is a 63 y.o. male with a history of systolic HF due to NICM, HTN, HLD, DM and CAD.   He was admitted from cath lab 03/19/18 with volume overload.Cath showed 90% RCA stenosis (worsened) and heunderwent PCI/DES to RCA by Dr. Ellyn Hack.He was started on Brilinta in addition to his home ASA.Brilinta was provided through [place].HF team was consulted due to cardiorenal syndrome. He was started on milrinone 0.125 mcg/kg/min to augment diuresis. He diuresed with high dose IV lasix and metolazone and transitioned totorsemide 60 mg daily + metolazone 5 mg on Mondays and Fridays.ReDS clip reading improved from 45% to 33%. He was set up with [facility] vest protocol for home. HF medications optimized. Entresto and spiroheld with AKI. Coreg decreased with need for milrinone. DC weight 206 lbs.  Seen in ED 2/26 with high blood sugars 400-500. Given IVF and Toujeo adjusted with instructions to follow up with PCP.  He presents via Engineer, civil (consulting) for a telehealth visit today.  Overall feeling fine. SOB with exertion but says he is out walking when the weather is ok. + Bendopnea. Denies PND/Orthopnea.  Appetite ok. Says he has been eating some country ham biscuits.  No fever or chills. Weight at home 209-210  Pounds. Continues to dip tobacco.  Taking  all medications. Followed [facility].     he denies symptoms worrisome for COVID 19.   Past Medical History:  Diagnosis Date  . Acid reflux   . Back pain    f4 and f5  . CAD (coronary artery disease)   . CHF (congestive heart failure) ([place])    last echo was in March 2016  . Diabetes mellitus 2005  . Diverticulosis   . H/O chest pain 2005   ECHO  . HTN (hypertension)   . Hyperlipidemia   . Nonischemic cardiomyopathy ([place])   . Onychomycosis   . Polysubstance abuse [facility])    Past Surgical History:  Procedure Laterality Date  . APPENDECTOMY    . CARDIAC CATHETERIZATION  2005   4 frech cath  . COLONOSCOPY N/A 08/01/2013   Procedure: COLONOSCOPY;  Surgeon: Rogene Houston, MD;  Location[facility];  Service: Endoscopy;  Laterality: N/A;  rescheduled to [place] notified pt  . CORONARY STENT INTERVENTION N/A 03/19/2018   Procedure: CORONARY STENT INTERVENTION;  Surgeon: Leonie Man, MD;  Location: [place] CV LAB;  Service: Cardiovascular;  Laterality: N/A;  . ESOPHAGOGASTRODUODENOSCOPY N/A 04/23/2015   Procedure: ESOPHAGOGASTRODUODENOSCOPY (EGD);  Surgeon: Rogene Houston, MD;  Location: [facility];  Service: Endoscopy;  Laterality: N/A;  1:25  . RIGHT/LEFT HEART CATH AND CORONARY ANGIOGRAPHY N/A 06/03/2016   Procedure: Right/Left Heart Cath and Coronary Angiography;  Surgeon: Belva Crome, MD;  Location: [place] CV LAB;  Service: Cardiovascular;  Laterality: N/A;  . RIGHT/LEFT HEART CATH AND CORONARY  ANGIOGRAPHY N/A 03/19/2018   Procedure: RIGHT/LEFT HEART CATH AND CORONARY ANGIOGRAPHY;  Surgeon: Jolaine Artist, MD;  Location: [place] CV LAB;  Service: Cardiovascular;  Laterality: N/A;     Current Outpatient Medications  Medication Sig Dispense Refill  . aspirin EC 81 MG tablet Take 81 mg by mouth daily.    . Capsaicin (ICY HOT ARTHRITIS THERAPY EX) Apply 1 application topically 2 (two) times daily as needed (back pain).     . carvedilol (COREG) 6.25 MG  tablet Take 1 tablet (6.25 mg total) by mouth 2 (two) times daily with a meal. 60 tablet 5  . Cholecalciferol (VITAMIN D3) 25 MCG (1000 UT) CAPS Take 2,000 Units by mouth daily.    . empagliflozin (JARDIANCE) 10 MG TABS tablet Take 10 mg by mouth daily. 30 tablet 6  . fluticasone (FLONASE) 50 MCG/ACT nasal spray USE 2 SPRAYS IN EACH NOSTRIL DAILY 48 g 1  . hydrALAZINE (APRESOLINE) 100 MG tablet Take 1 tablet (100 mg total) by mouth 3 (three) times daily. 90 tablet 5  . Insulin Glargine, 2 Unit Dial, (TOUJEO MAX SOLOSTAR) 300 UNIT/ML SOPN Inject 30 Units into the skin daily before breakfast AND 15 Units daily before supper. 6 pen 11  . isosorbide mononitrate (IMDUR) 120 MG 24 hr tablet Take 1 tablet (120 mg total) by mouth daily. 30 tablet 5  . metFORMIN (GLUCOPHAGE-XR) 750 MG 24 hr tablet TAKE 1 TABLET EVERY MORNING WITH BREAKFAST 90 tablet 0  . metolazone (ZAROXOLYN) 5 MG tablet Take 1 tablet (5 mg total) by mouth 2 (two) times a week. Take on Mondays and Fridays 10 tablet 5  . Multiple Vitamins-Minerals (CENTRUM SILVER ADULT 50+ PO) Take 1 tablet by mouth daily.    . nabumetone (RELAFEN) 500 MG tablet Take 2 tablets (1,000 mg total) by mouth 2 (two) times daily. For muscle and joint pain 360 tablet 1  . nitroGLYCERIN (NITROSTAT) 0.4 MG SL tablet DISSOLVE 1 TAB UNDER TOUNGE FOR CHEST PAIN. MAY REPEAT EVERY 5 MINUTES FOR 3 DOSES. IF NO RELIEF CALL 911 OR GO TO ER (Patient taking differently: Place 0.4 mg under the tongue every 5 (five) minutes as needed for chest pain. ) 25 tablet 2  . Omega-3 Fatty Acids (FISH OIL) 1000 MG CPDR Take 1,000 mg by mouth daily.     [name] VERIO test strip USE FOUR TIMES DAILY AS NEEDED 100 each 11  . pantoprazole (PROTONIX) 40 MG tablet Take 1 tablet (40 mg total) by mouth daily. 90 tablet 1  . pregabalin (LYRICA) 225 MG capsule Take 1 capsule (225 mg total) by mouth at bedtime. 30 capsule 2  . PROAIR HFA 108 (90 Base) MCG/ACT inhaler USE 2 PUFFS EVERY 6 HOURS AS  NEEDED 8.5 g 3  . ranolazine (RANEXA) 500 MG 12 hr tablet TAKE ONE TABLET BY MOUTH TWICE DAILY (Patient taking differently: Take 500 mg by mouth 2 (two) times daily. ) 60 tablet 5  . rosuvastatin (CRESTOR) 10 MG tablet Take 1 tablet (10 mg total) by mouth daily. 90 tablet 1  . terbinafine (LAMISIL) 1 % cream Apply 1 application topically 2 (two) times daily. Apply to both feet and between toes 30 g 1  . torsemide (DEMADEX) 20 MG tablet Take 3 tablets (60 mg total) by mouth daily. 90 tablet 5   Current Facility-Administered Medications  Medication Dose Route Frequency Provider Last Rate Last Dose  . aflibercept (EYLEA) SOLN 2 mg  2 mg Intravitreal  [name], MD  2 mg at 04/25/18 6073    Allergies:   Sulfa antibiotics and Penicillins   Social History:  The patient  reports that he has never smoked. His smokeless tobacco use includes snuff. He reports current alcohol use of about 1.0 standard drinks of alcohol per week. He reports current drug use. Drug: Marijuana.   Family History:  The patient's family history includes Alcohol abuse in his brother; Cancer in his mother; Diabetes in his father; Hypertension in his father.   ROS:  Please see the history of present illness.   All other systems are personally reviewed and negative.   Exam:  Tele Health Call; Exam is subjective General:  Speaks in full sentences. No resp difficulty. Lungs: Normal respiratory effort with conversation.  Abdomen: Non-distended per patient report Extremities: Pt denies edema. Neuro: Alert & oriented x 3.   Recent Labs: 03/26/2018: Magnesium 2.2 04/10/2018: ALT 28; BNP 133.3 04/18/2018: Hemoglobin 11.2; Platelets 214 04/19/2018: BUN 80; Creatinine, Ser 2.79; Potassium 3.7; Sodium 134  4/28/202 Creatinine 2.09   Wt Readings from Last 3 Encounters:  07/18/18 95.3 kg (210 lb)  04/27/18 96.7 kg (213 lb 4 oz)  04/18/18 88.9 kg (196 lb)      ASSESSMENT AND PLAN:   1. Chronic systolic CHF: Mixed ICM and  NICM. He had preexisting NICM diagnosed in 2005 when his EF was 35% and had normal cors. Echo in 05/2016 with EF 30-35% and cath showed 70-75% stenosis in the proximal and mid LAD,90% ostial stenosis in the 1st diagonal, and 60-70% stenosis in the mid RCA. [place] 05/2016- Fick C.O. 5.86/index 2.74 - Echo from 4/19 reviewed personally EF read as 40-45% but it is more like 25% - Echo1/20/20EF 30-35% - Continue torsemide 60 mg daily.Check Reds Clip next week.  -Continue coreg 6.25 mg BID. - Continue imdur 120 mg daily.  -Continuehydralazine 100 mg TID Arlyce Harman on hold with AKI. Delene Loll has been on hold with CKD  - Willeventually need ICD if EF remains < 35% post PCI in 3 months  2. CAD -s/p PCI/DES of RCA 03/19/2018. Other CAD stable. Manage medically - No s/s ischemia  - Continue isosorbide as above.  - Continue Ranexa. - ContinueDAPTand statin  3. ETOH abuse - Congratulated him on cessation.  4. Tobacco abuse He continue dip tobacco.  - Encouraged complete cessation.  5. HTN    6. CKD stage III - Baseline creatinine 1.7-2.2. Most recent creatinine was 2.09 on 06/19/18  - Follows with Dr. Hinda Lenis   7. DM -Per PCP. - Avoiding SGLT2i with CRCl < 30   8. Fatigue/snoring -Will do home sleep study  COVID screen The patient does not have any symptoms that suggest any further testing/ screening at this time.  Social distancing reinforced today.  Patient Risk: After full review of this patients clinical status, I feel that they are at moderate risk for cardiac decompensation at this time.  Relevant cardiac medications were reviewed at length with the patient today. The patient does not have concerns regarding their medications at this time.   The following changes were made today: None. We will send in a refill for torsemide today.  Recommended follow-up:  4 weeks for virtual visit. I will ask [facility] to check Reds Clip next week and BP.   Today, I  have spent 15 minutes with the patient with telehealth technology discussing the above issues .    Jeanmarie Hubert, NP  07/18/2018 11:17 AM  [place]  [place] [address] [phone] (office) [phone] (fax)

## 2018-07-18 NOTE — Patient Instructions (Addendum)
A refill for Torsemide have been sent to your pharmacy.  Recommended follow-up: 4 weeks for virtual visit.   [facility] to check Reds Clip next week and BP. They are scheduled for another rhome visit on Tuesday, June 2nd, 2020

## 2018-07-18 NOTE — Addendum Note (Signed)
Encounter addended by: Valeda Malm, RN on: 07/18/2018 11:44 AM  Actions taken: Pharmacy for encounter modified, Order list changed, Clinical Note Signed

## 2018-07-18 NOTE — Progress Notes (Signed)
Spoke with patient, reviewed AVS.  No questions endorsed. Verbalized understanding.  Instructions sent via mychart. Called [facility], their next home visit is scheduled for June 2.  Advised of recommendations for reds clip and BP check.  Confirmed.  Spoke with [employer].

## 2018-07-19 ENCOUNTER — Encounter (INDEPENDENT_AMBULATORY_CARE_PROVIDER_SITE_OTHER): Payer: Self-pay | Admitting: *Deleted

## 2018-07-20 ENCOUNTER — Telehealth (HOSPITAL_COMMUNITY): Payer: Self-pay | Admitting: Licensed Clinical Social Worker

## 2018-07-20 ENCOUNTER — Other Ambulatory Visit (HOSPITAL_COMMUNITY): Payer: Self-pay | Admitting: Internal Medicine

## 2018-07-20 ENCOUNTER — Other Ambulatory Visit: Payer: Self-pay | Admitting: Family Medicine

## 2018-07-20 NOTE — Telephone Encounter (Signed)
CSW attempted to contact patient to inform of food delivery on Monday but unable to leave message. Raquel Sarna, [place], [place]

## 2018-07-25 DIAGNOSIS — I272 Pulmonary hypertension, unspecified: Secondary | ICD-10-CM | POA: Diagnosis not present

## 2018-07-25 DIAGNOSIS — E1122 Type 2 diabetes mellitus with diabetic chronic kidney disease: Secondary | ICD-10-CM | POA: Diagnosis not present

## 2018-07-25 DIAGNOSIS — Z794 Long term (current) use of insulin: Secondary | ICD-10-CM | POA: Diagnosis not present

## 2018-07-25 DIAGNOSIS — K219 Gastro-esophageal reflux disease without esophagitis: Secondary | ICD-10-CM | POA: Diagnosis not present

## 2018-07-25 DIAGNOSIS — E785 Hyperlipidemia, unspecified: Secondary | ICD-10-CM | POA: Diagnosis not present

## 2018-07-25 DIAGNOSIS — N183 Chronic kidney disease, stage 3 (moderate): Secondary | ICD-10-CM | POA: Diagnosis not present

## 2018-07-25 DIAGNOSIS — I2510 Atherosclerotic heart disease of native coronary artery without angina pectoris: Secondary | ICD-10-CM | POA: Diagnosis not present

## 2018-07-25 DIAGNOSIS — Z9181 History of falling: Secondary | ICD-10-CM | POA: Diagnosis not present

## 2018-07-25 DIAGNOSIS — I5023 Acute on chronic systolic (congestive) heart failure: Secondary | ICD-10-CM | POA: Diagnosis not present

## 2018-07-25 DIAGNOSIS — I130 Hypertensive heart and chronic kidney disease with heart failure and stage 1 through stage 4 chronic kidney disease, or unspecified chronic kidney disease: Secondary | ICD-10-CM | POA: Diagnosis not present

## 2018-07-25 DIAGNOSIS — I255 Ischemic cardiomyopathy: Secondary | ICD-10-CM | POA: Diagnosis not present

## 2018-07-26 ENCOUNTER — Other Ambulatory Visit: Payer: Self-pay

## 2018-07-26 ENCOUNTER — Telehealth (HOSPITAL_COMMUNITY): Payer: Self-pay | Admitting: Licensed Clinical Social Worker

## 2018-07-26 NOTE — Telephone Encounter (Signed)
CSW contacted patient to follow up on weekly food delivery package. Patient informed of delivery time and no face to face contact during delivery. CSW shared transition option for food delivery as the Covid 19 Food relief program will be ending on July 30, 2018. Message left as no answer.  CSW continues to follow for supportive needs. Raquel Sarna, [place], [place]

## 2018-07-27 ENCOUNTER — Ambulatory Visit (INDEPENDENT_AMBULATORY_CARE_PROVIDER_SITE_OTHER): Payer: Medicare Other | Admitting: Family Medicine

## 2018-07-27 ENCOUNTER — Encounter: Payer: Self-pay | Admitting: Family Medicine

## 2018-07-27 VITALS — BP 96/66 | HR 76 | Temp 98.6°F | Ht 69.0 in | Wt 210.0 lb

## 2018-07-27 DIAGNOSIS — I10 Essential (primary) hypertension: Secondary | ICD-10-CM

## 2018-07-27 DIAGNOSIS — E7800 Pure hypercholesterolemia, unspecified: Secondary | ICD-10-CM | POA: Diagnosis not present

## 2018-07-27 DIAGNOSIS — E1142 Type 2 diabetes mellitus with diabetic polyneuropathy: Secondary | ICD-10-CM

## 2018-07-27 DIAGNOSIS — K219 Gastro-esophageal reflux disease without esophagitis: Secondary | ICD-10-CM | POA: Diagnosis not present

## 2018-07-27 DIAGNOSIS — E118 Type 2 diabetes mellitus with unspecified complications: Secondary | ICD-10-CM

## 2018-07-27 DIAGNOSIS — Z794 Long term (current) use of insulin: Secondary | ICD-10-CM

## 2018-07-27 LAB — BAYER DCA HB A1C WAIVED: HB A1C (BAYER DCA - WAIVED): 13.1 % — ABNORMAL HIGH (ref <7.0)

## 2018-07-27 MED ORDER — HYDRALAZINE HCL 100 MG PO TABS: 100.0000 mg | ORAL_TABLET | Freq: Three times a day (TID) | ORAL | 5 refills | Status: DC

## 2018-07-27 MED ORDER — METFORMIN HCL ER 750 MG PO TB24: ORAL_TABLET | ORAL | 0 refills | Status: DC

## 2018-07-27 MED ORDER — CARVEDILOL 6.25 MG PO TABS: 6.2500 mg | ORAL_TABLET | Freq: Two times a day (BID) | ORAL | 5 refills | Status: DC

## 2018-07-27 MED ORDER — METOLAZONE 5 MG PO TABS: 5.0000 mg | ORAL_TABLET | ORAL | 5 refills | Status: DC

## 2018-07-27 MED ORDER — PANTOPRAZOLE SODIUM 40 MG PO TBEC: 40.0000 mg | DELAYED_RELEASE_TABLET | Freq: Every day | ORAL | 1 refills | Status: DC

## 2018-07-27 MED ORDER — ROSUVASTATIN CALCIUM 10 MG PO TABS: 10.0000 mg | ORAL_TABLET | Freq: Every day | ORAL | 1 refills | Status: DC

## 2018-07-27 MED ORDER — PREGABALIN 225 MG PO CAPS: 225.0000 mg | ORAL_CAPSULE | Freq: Every day | ORAL | 2 refills | Status: DC

## 2018-07-27 MED ORDER — INSULIN GLARGINE (2 UNIT DIAL) 300 UNIT/ML ~~LOC~~ SOPN: PEN_INJECTOR | SUBCUTANEOUS | 11 refills | Status: DC

## 2018-07-27 MED ORDER — GLUCOSE BLOOD VI STRP: ORAL_STRIP | 11 refills | Status: DC

## 2018-07-27 MED ORDER — TORSEMIDE 20 MG PO TABS: 60.0000 mg | ORAL_TABLET | Freq: Every day | ORAL | 5 refills | Status: DC

## 2018-07-27 MED ORDER — ISOSORBIDE MONONITRATE ER 120 MG PO TB24: 120.0000 mg | ORAL_TABLET | Freq: Every day | ORAL | 5 refills | Status: DC

## 2018-07-27 MED ORDER — EMPAGLIFLOZIN 10 MG PO TABS: 10.0000 mg | ORAL_TABLET | Freq: Every day | ORAL | 6 refills | Status: DC

## 2018-07-27 NOTE — Progress Notes (Addendum)
Subjective:  Patient ID: Brad Mendoza,  male    DOB: December 23, 1955  Age: 63 y.o.    CC: Medical Management of Chronic Issues   HPI Brad Mendoza presents for  follow-up of hypertension. Patient has no history of headache chest pain or shortness of breath or recent cough. Patient also denies symptoms of TIA such as numbness weakness lateralizing. Patient denies side effects from medication. States taking it regularly.  Patient also  in for follow-up of elevated cholesterol. Doing well without complaints on current medication. Denies side effects  including myalgia and arthralgia and nausea. Also in today for liver function testing. Currently no chest pain, shortness of breath or other cardiovascular related symptoms noted.  Follow-up of diabetes. Patient does check blood sugar at home. Readings run between 185 fasting and 300  Patient denies symptoms such as excessive hunger or urinary frequency, excessive hunger, nausea No significant hypoglycemic spells noted. Medications reviewed. Pt reports taking them regularly. Pt. denies complication/adverse reaction today.    History Brad Mendoza has a past medical history of Acid reflux, Back pain, CAD (coronary artery disease), CHF (congestive heart failure) ([place]), Diabetes mellitus (2005), Diverticulosis, H/O chest pain (2005), HTN (hypertension), Hyperlipidemia, Nonischemic cardiomyopathy ([place]), Onychomycosis, and Polysubstance abuse ([place]).   He has a past surgical history that includes Appendectomy; Colonoscopy (N/A, 08/01/2013); Cardiac catheterization (2005); Esophagogastroduodenoscopy (N/A, 04/23/2015); RIGHT/LEFT HEART CATH AND CORONARY ANGIOGRAPHY (N/A, 06/03/2016); RIGHT/LEFT HEART CATH AND CORONARY ANGIOGRAPHY (N/A, 03/19/2018); and CORONARY STENT INTERVENTION (N/A, 03/19/2018).   His family history includes Alcohol abuse in his brother; Cancer in his mother; Diabetes in his father; Hypertension in his father.He reports that he has never smoked.  His smokeless tobacco use includes snuff. He reports current alcohol use of about 1.0 standard drinks of alcohol per week. He reports current drug use. Drug: Marijuana.  Current Outpatient Medications on File Prior to Visit  Medication Sig Dispense Refill  . aspirin EC 81 MG tablet Take 81 mg by mouth daily.    . Capsaicin (ICY HOT ARTHRITIS THERAPY EX) Apply 1 application topically 2 (two) times daily as needed (back pain).     . Cholecalciferol (VITAMIN D3) 25 MCG (1000 UT) CAPS Take 2,000 Units by mouth daily.    . fluticasone (FLONASE) 50 MCG/ACT nasal spray USE 2 SPRAYS IN EACH NOSTRIL DAILY 48 g 1  . Multiple Vitamins-Minerals (CENTRUM SILVER ADULT 50+ PO) Take 1 tablet by mouth daily.    . nabumetone (RELAFEN) 500 MG tablet TAKE TWO TABLETS BY MOUTH TWICE DAILY 360 tablet 1  . nitroGLYCERIN (NITROSTAT) 0.4 MG SL tablet DISSOLVE 1 TAB UNDER TOUNGE FOR CHEST PAIN. MAY REPEAT EVERY 5 MINUTES FOR 3 DOSES. IF NO RELIEF CALL 911 OR GO TO ER (Patient taking differently: Place 0.4 mg under the tongue every 5 (five) minutes as needed for chest pain. ) 25 tablet 2  . Omega-3 Fatty Acids (FISH OIL) 1000 MG CPDR Take 1,000 mg by mouth daily.     [name] PROAIR HFA 108 (90 Base) MCG/ACT inhaler USE 2 PUFFS EVERY 6 HOURS AS NEEDED 8.5 g 3  . ranolazine (RANEXA) 500 MG 12 hr tablet TAKE ONE TABLET BY MOUTH TWICE DAILY 60 tablet 5  . terbinafine (LAMISIL) 1 % cream Apply 1 application topically 2 (two) times daily. Apply to both feet and between toes 30 g 1   Current Facility-Administered Medications on File Prior to Visit  Medication Dose Route Frequency Provider Last Rate Last Dose  . aflibercept (EYLEA) SOLN 2  mg  2 mg Intravitreal  [name], MD   2 mg at 04/25/18 0029    ROS Review of Systems  Constitutional: Negative.   HENT: Negative.   Eyes: Negative for visual disturbance.  Respiratory: Negative for cough and shortness of breath.   Cardiovascular: Negative for chest pain and leg swelling.   Gastrointestinal: Negative for abdominal pain, diarrhea, nausea and vomiting.  Genitourinary: Negative for difficulty urinating.  Musculoskeletal: Negative for arthralgias and myalgias.  Skin: Negative for rash.  Neurological: Negative for headaches.  Psychiatric/Behavioral: Negative for sleep disturbance.    Objective:  BP 96/66   Pulse 76   Temp 98.6 F (37 C) (Oral)   Ht 5' 9"  (1.753 m)   Wt 210 lb (95.3 kg)   BMI 31.01 kg/m   BP Readings from Last 3 Encounters:  07/27/18 96/66  04/27/18 (!) 112/57  04/18/18 140/88    Wt Readings from Last 3 Encounters:  07/27/18 210 lb (95.3 kg)  07/18/18 210 lb (95.3 kg)  04/27/18 213 lb 4 oz (96.7 kg)     Physical Exam Constitutional:      General: He is not in acute distress.    Appearance: He is well-developed.  HENT:     Head: Normocephalic and atraumatic.     Right Ear: External ear normal.     Left Ear: External ear normal.     Nose: Nose normal.  Eyes:     Conjunctiva/sclera: Conjunctivae normal.     Pupils: Pupils are equal, round, and reactive to light.  Neck:     Musculoskeletal: Normal range of motion and neck supple.  Cardiovascular:     Rate and Rhythm: Normal rate and regular rhythm.     Heart sounds: Normal heart sounds. No murmur.  Pulmonary:     Effort: Pulmonary effort is normal. No respiratory distress.     Breath sounds: Normal breath sounds. No wheezing or rales.  Abdominal:     Palpations: Abdomen is soft.     Tenderness: There is no abdominal tenderness.  Musculoskeletal: Normal range of motion.  Skin:    General: Skin is warm and dry.  Neurological:     Mental Status: He is alert and oriented to person, place, and time.     Deep Tendon Reflexes: Reflexes are normal and symmetric.  Psychiatric:        Behavior: Behavior normal.        Thought Content: Thought content normal.        Judgment: Judgment normal.         Assessment & Plan:   Brad Mendoza was seen today for medical management  of chronic issues.  Diagnoses and all orders for this visit:  Type 2 diabetes mellitus with diabetic polyneuropathy, with long-term current use of insulin (HCC) -     Bayer DCA Hb A1c Waived -     C-peptide  Pure hypercholesterolemia -     Lipid panel -     rosuvastatin (CRESTOR) 10 MG tablet; Take 1 tablet (10 mg total) by mouth daily.  Essential hypertension -     CMP14+EGFR  Type 2 diabetes mellitus with complication, with long-term current use of insulin (HCC) -     metFORMIN (GLUCOPHAGE-XR) 750 MG 24 hr tablet; TAKE 1 TABLET EVERY MORNING WITH BREAKFAST  Gastroesophageal reflux disease, esophagitis presence not specified -     pantoprazole (PROTONIX) 40 MG tablet; Take 1 tablet (40 mg total) by mouth daily.  Other orders -  Discontinue: Insulin Glargine, 2 Unit Dial, (TOUJEO MAX SOLOSTAR) 300 UNIT/ML SOPN; Inject 45 Units into the skin daily before breakfast AND 45 Units daily before supper. -     torsemide (DEMADEX) 20 MG tablet; Take 3 tablets (60 mg total) by mouth daily. -     carvedilol (COREG) 6.25 MG tablet; Take 1 tablet (6.25 mg total) by mouth 2 (two) times daily with a meal. -     metolazone (ZAROXOLYN) 5 MG tablet; Take 1 tablet (5 mg total) by mouth 2 (two) times a week. Take on Mondays and Fridays -     Insulin Glargine, 2 Unit Dial, (TOUJEO MAX SOLOSTAR) 300 UNIT/ML SOPN; Inject 45 Units into the skin daily before breakfast AND 45 Units daily before supper. -     isosorbide mononitrate (IMDUR) 120 MG 24 hr tablet; Take 1 tablet (120 mg total) by mouth daily. -     hydrALAZINE (APRESOLINE) 100 MG tablet; Take 1 tablet (100 mg total) by mouth 3 (three) times daily. -     empagliflozin (JARDIANCE) 10 MG TABS tablet; Take 10 mg by mouth daily. -     glucose blood (ONETOUCH VERIO) test strip; USE FOUR TIMES DAILY AS NEEDED -     pregabalin (LYRICA) 225 MG capsule; Take 1 capsule (225 mg total) by mouth at bedtime. -     C-peptide   I have changed [facility] L.  Kidney's OneTouch Verio to glucose blood. I am also having him maintain his aspirin EC, Multiple Vitamins-Minerals (CENTRUM SILVER ADULT 50+ PO), Capsaicin (ICY HOT ARTHRITIS THERAPY EX), Fish Oil, nitroGLYCERIN, Vitamin D3, terbinafine, ProAir HFA, fluticasone, ranolazine, nabumetone, metFORMIN, torsemide, carvedilol, metolazone, Insulin Glargine (2 Unit Dial), pantoprazole, isosorbide mononitrate, hydrALAZINE, empagliflozin, rosuvastatin, and pregabalin. We will continue to administer aflibercept.  Meds ordered this encounter  Medications  . DISCONTD: Insulin Glargine, 2 Unit Dial, (TOUJEO MAX SOLOSTAR) 300 UNIT/ML SOPN    Sig: Inject 45 Units into the skin daily before breakfast AND 45 Units daily before supper.    Dispense:  6 pen    Refill:  11  . metFORMIN (GLUCOPHAGE-XR) 750 MG 24 hr tablet    Sig: TAKE 1 TABLET EVERY MORNING WITH BREAKFAST    Dispense:  90 tablet    Refill:  0  . torsemide (DEMADEX) 20 MG tablet    Sig: Take 3 tablets (60 mg total) by mouth daily.    Dispense:  90 tablet    Refill:  5  . carvedilol (COREG) 6.25 MG tablet    Sig: Take 1 tablet (6.25 mg total) by mouth 2 (two) times daily with a meal.    Dispense:  60 tablet    Refill:  5  . metolazone (ZAROXOLYN) 5 MG tablet    Sig: Take 1 tablet (5 mg total) by mouth 2 (two) times a week. Take on Mondays and Fridays    Dispense:  10 tablet    Refill:  5  . Insulin Glargine, 2 Unit Dial, (TOUJEO MAX SOLOSTAR) 300 UNIT/ML SOPN    Sig: Inject 45 Units into the skin daily before breakfast AND 45 Units daily before supper.    Dispense:  6 pen    Refill:  11  . pantoprazole (PROTONIX) 40 MG tablet    Sig: Take 1 tablet (40 mg total) by mouth daily.    Dispense:  90 tablet    Refill:  1  . isosorbide mononitrate (IMDUR) 120 MG 24 hr tablet    Sig: Take 1 tablet (120  mg total) by mouth daily.    Dispense:  30 tablet    Refill:  5  . hydrALAZINE (APRESOLINE) 100 MG tablet    Sig: Take 1 tablet (100 mg total) by  mouth 3 (three) times daily.    Dispense:  90 tablet    Refill:  5  . empagliflozin (JARDIANCE) 10 MG TABS tablet    Sig: Take 10 mg by mouth daily.    Dispense:  30 tablet    Refill:  6  . rosuvastatin (CRESTOR) 10 MG tablet    Sig: Take 1 tablet (10 mg total) by mouth daily.    Dispense:  90 tablet    Refill:  1  . glucose blood (ONETOUCH VERIO) test strip    Sig: USE FOUR TIMES DAILY AS NEEDED    Dispense:  100 each    Refill:  11    E11.9  . pregabalin (LYRICA) 225 MG capsule    Sig: Take 1 capsule (225 mg total) by mouth at bedtime.    Dispense:  30 capsule    Refill:  2     Follow-up: Return in about 3 months (around 10/27/2018).  [name], M.D.

## 2018-07-28 LAB — CMP14+EGFR
ALT: 24 IU/L (ref 0–44)
AST: 23 IU/L (ref 0–40)
Albumin/Globulin Ratio: 1.3 (ref 1.2–2.2)
Albumin: 3.5 g/dL — ABNORMAL LOW (ref 3.8–4.8)
Alkaline Phosphatase: 89 IU/L (ref 39–117)
BUN/Creatinine Ratio: 18 (ref 10–24)
BUN: 55 mg/dL — ABNORMAL HIGH (ref 8–27)
Bilirubin Total: 0.2 mg/dL (ref 0.0–1.2)
CO2: 20 mmol/L (ref 20–29)
Calcium: 8.9 mg/dL (ref 8.6–10.2)
Chloride: 101 mmol/L (ref 96–106)
Creatinine, Ser: 3 mg/dL — ABNORMAL HIGH (ref 0.76–1.27)
GFR calc Af Amer: 25 mL/min/{1.73_m2} — ABNORMAL LOW (ref >59)
GFR calc non Af Amer: 21 mL/min/{1.73_m2} — ABNORMAL LOW (ref >59)
Globulin, Total: 2.6 g/dL (ref 1.5–4.5)
Glucose: 223 mg/dL — ABNORMAL HIGH (ref 65–99)
Potassium: 4.1 mmol/L (ref 3.5–5.2)
Sodium: 139 mmol/L (ref 134–144)
Total Protein: 6.1 g/dL (ref 6.0–8.5)

## 2018-07-28 LAB — LIPID PANEL
Chol/HDL Ratio: 8 ratio — ABNORMAL HIGH (ref 0.0–5.0)
Cholesterol, Total: 248 mg/dL — ABNORMAL HIGH (ref 100–199)
HDL: 31 mg/dL — ABNORMAL LOW (ref >39)
Triglycerides: 522 mg/dL — ABNORMAL HIGH (ref 0–149)

## 2018-07-28 LAB — C-PEPTIDE: C-Peptide: 3.8 ng/mL (ref 1.1–4.4)

## 2018-07-29 ENCOUNTER — Encounter: Payer: Self-pay | Admitting: Family Medicine

## 2018-08-01 DIAGNOSIS — I272 Pulmonary hypertension, unspecified: Secondary | ICD-10-CM | POA: Diagnosis not present

## 2018-08-01 DIAGNOSIS — I255 Ischemic cardiomyopathy: Secondary | ICD-10-CM | POA: Diagnosis not present

## 2018-08-01 DIAGNOSIS — Z794 Long term (current) use of insulin: Secondary | ICD-10-CM | POA: Diagnosis not present

## 2018-08-01 DIAGNOSIS — E1122 Type 2 diabetes mellitus with diabetic chronic kidney disease: Secondary | ICD-10-CM | POA: Diagnosis not present

## 2018-08-01 DIAGNOSIS — Z7982 Long term (current) use of aspirin: Secondary | ICD-10-CM | POA: Diagnosis not present

## 2018-08-01 DIAGNOSIS — I5023 Acute on chronic systolic (congestive) heart failure: Secondary | ICD-10-CM | POA: Diagnosis not present

## 2018-08-01 DIAGNOSIS — N183 Chronic kidney disease, stage 3 (moderate): Secondary | ICD-10-CM | POA: Diagnosis not present

## 2018-08-01 DIAGNOSIS — I2510 Atherosclerotic heart disease of native coronary artery without angina pectoris: Secondary | ICD-10-CM | POA: Diagnosis not present

## 2018-08-01 DIAGNOSIS — K219 Gastro-esophageal reflux disease without esophagitis: Secondary | ICD-10-CM | POA: Diagnosis not present

## 2018-08-01 DIAGNOSIS — I130 Hypertensive heart and chronic kidney disease with heart failure and stage 1 through stage 4 chronic kidney disease, or unspecified chronic kidney disease: Secondary | ICD-10-CM | POA: Diagnosis not present

## 2018-08-01 DIAGNOSIS — E785 Hyperlipidemia, unspecified: Secondary | ICD-10-CM | POA: Diagnosis not present

## 2018-08-01 DIAGNOSIS — Z9181 History of falling: Secondary | ICD-10-CM | POA: Diagnosis not present

## 2018-08-02 ENCOUNTER — Encounter (INDEPENDENT_AMBULATORY_CARE_PROVIDER_SITE_OTHER): Payer: Medicare Other | Admitting: Ophthalmology

## 2018-08-07 DIAGNOSIS — I255 Ischemic cardiomyopathy: Secondary | ICD-10-CM | POA: Diagnosis not present

## 2018-08-07 DIAGNOSIS — I130 Hypertensive heart and chronic kidney disease with heart failure and stage 1 through stage 4 chronic kidney disease, or unspecified chronic kidney disease: Secondary | ICD-10-CM | POA: Diagnosis not present

## 2018-08-07 DIAGNOSIS — I272 Pulmonary hypertension, unspecified: Secondary | ICD-10-CM | POA: Diagnosis not present

## 2018-08-07 DIAGNOSIS — Z794 Long term (current) use of insulin: Secondary | ICD-10-CM | POA: Diagnosis not present

## 2018-08-07 DIAGNOSIS — K219 Gastro-esophageal reflux disease without esophagitis: Secondary | ICD-10-CM | POA: Diagnosis not present

## 2018-08-07 DIAGNOSIS — I5023 Acute on chronic systolic (congestive) heart failure: Secondary | ICD-10-CM | POA: Diagnosis not present

## 2018-08-07 DIAGNOSIS — I2510 Atherosclerotic heart disease of native coronary artery without angina pectoris: Secondary | ICD-10-CM | POA: Diagnosis not present

## 2018-08-07 DIAGNOSIS — E785 Hyperlipidemia, unspecified: Secondary | ICD-10-CM | POA: Diagnosis not present

## 2018-08-07 DIAGNOSIS — E1122 Type 2 diabetes mellitus with diabetic chronic kidney disease: Secondary | ICD-10-CM | POA: Diagnosis not present

## 2018-08-07 DIAGNOSIS — Z7982 Long term (current) use of aspirin: Secondary | ICD-10-CM | POA: Diagnosis not present

## 2018-08-07 DIAGNOSIS — N183 Chronic kidney disease, stage 3 (moderate): Secondary | ICD-10-CM | POA: Diagnosis not present

## 2018-08-07 DIAGNOSIS — Z9181 History of falling: Secondary | ICD-10-CM | POA: Diagnosis not present

## 2018-08-14 DIAGNOSIS — K219 Gastro-esophageal reflux disease without esophagitis: Secondary | ICD-10-CM | POA: Diagnosis not present

## 2018-08-14 DIAGNOSIS — I255 Ischemic cardiomyopathy: Secondary | ICD-10-CM | POA: Diagnosis not present

## 2018-08-14 DIAGNOSIS — E785 Hyperlipidemia, unspecified: Secondary | ICD-10-CM | POA: Diagnosis not present

## 2018-08-14 DIAGNOSIS — N183 Chronic kidney disease, stage 3 (moderate): Secondary | ICD-10-CM | POA: Diagnosis not present

## 2018-08-14 DIAGNOSIS — Z7982 Long term (current) use of aspirin: Secondary | ICD-10-CM | POA: Diagnosis not present

## 2018-08-14 DIAGNOSIS — I2510 Atherosclerotic heart disease of native coronary artery without angina pectoris: Secondary | ICD-10-CM | POA: Diagnosis not present

## 2018-08-14 DIAGNOSIS — I130 Hypertensive heart and chronic kidney disease with heart failure and stage 1 through stage 4 chronic kidney disease, or unspecified chronic kidney disease: Secondary | ICD-10-CM | POA: Diagnosis not present

## 2018-08-14 DIAGNOSIS — I272 Pulmonary hypertension, unspecified: Secondary | ICD-10-CM | POA: Diagnosis not present

## 2018-08-14 DIAGNOSIS — Z9181 History of falling: Secondary | ICD-10-CM | POA: Diagnosis not present

## 2018-08-14 DIAGNOSIS — I5023 Acute on chronic systolic (congestive) heart failure: Secondary | ICD-10-CM | POA: Diagnosis not present

## 2018-08-14 DIAGNOSIS — Z794 Long term (current) use of insulin: Secondary | ICD-10-CM | POA: Diagnosis not present

## 2018-08-14 DIAGNOSIS — E1122 Type 2 diabetes mellitus with diabetic chronic kidney disease: Secondary | ICD-10-CM | POA: Diagnosis not present

## 2018-08-15 ENCOUNTER — Encounter (INDEPENDENT_AMBULATORY_CARE_PROVIDER_SITE_OTHER): Payer: Medicare Other | Admitting: Ophthalmology

## 2018-08-17 ENCOUNTER — Other Ambulatory Visit: Payer: Self-pay

## 2018-08-17 ENCOUNTER — Ambulatory Visit (INDEPENDENT_AMBULATORY_CARE_PROVIDER_SITE_OTHER): Payer: Medicare Other

## 2018-08-17 DIAGNOSIS — I130 Hypertensive heart and chronic kidney disease with heart failure and stage 1 through stage 4 chronic kidney disease, or unspecified chronic kidney disease: Secondary | ICD-10-CM | POA: Diagnosis not present

## 2018-08-17 DIAGNOSIS — E1122 Type 2 diabetes mellitus with diabetic chronic kidney disease: Secondary | ICD-10-CM

## 2018-08-17 DIAGNOSIS — I272 Pulmonary hypertension, unspecified: Secondary | ICD-10-CM | POA: Diagnosis not present

## 2018-08-17 DIAGNOSIS — Z794 Long term (current) use of insulin: Secondary | ICD-10-CM

## 2018-08-17 DIAGNOSIS — Z7982 Long term (current) use of aspirin: Secondary | ICD-10-CM

## 2018-08-17 DIAGNOSIS — N183 Chronic kidney disease, stage 3 (moderate): Secondary | ICD-10-CM | POA: Diagnosis not present

## 2018-08-17 DIAGNOSIS — E785 Hyperlipidemia, unspecified: Secondary | ICD-10-CM

## 2018-08-17 DIAGNOSIS — F1010 Alcohol abuse, uncomplicated: Secondary | ICD-10-CM

## 2018-08-17 DIAGNOSIS — Z9181 History of falling: Secondary | ICD-10-CM

## 2018-08-17 DIAGNOSIS — K219 Gastro-esophageal reflux disease without esophagitis: Secondary | ICD-10-CM

## 2018-08-17 DIAGNOSIS — F17220 Nicotine dependence, chewing tobacco, uncomplicated: Secondary | ICD-10-CM

## 2018-08-17 DIAGNOSIS — I255 Ischemic cardiomyopathy: Secondary | ICD-10-CM

## 2018-08-17 DIAGNOSIS — I5023 Acute on chronic systolic (congestive) heart failure: Secondary | ICD-10-CM

## 2018-08-17 DIAGNOSIS — I2510 Atherosclerotic heart disease of native coronary artery without angina pectoris: Secondary | ICD-10-CM

## 2018-08-20 ENCOUNTER — Other Ambulatory Visit: Payer: Self-pay

## 2018-08-20 ENCOUNTER — Ambulatory Visit (HOSPITAL_COMMUNITY)
Admission: RE | Admit: 2018-08-20 | Discharge: 2018-08-20 | Disposition: A | Discharge status: Home / Self Care | Payer: Medicare Other | Source: Ambulatory Visit | Attending: Cardiology | Admitting: Cardiology

## 2018-08-20 ENCOUNTER — Encounter (HOSPITAL_COMMUNITY): Payer: Self-pay

## 2018-08-20 VITALS — Wt 222.0 lb

## 2018-08-20 DIAGNOSIS — Z955 Presence of coronary angioplasty implant and graft: Secondary | ICD-10-CM | POA: Diagnosis not present

## 2018-08-20 DIAGNOSIS — K219 Gastro-esophageal reflux disease without esophagitis: Secondary | ICD-10-CM | POA: Diagnosis not present

## 2018-08-20 DIAGNOSIS — Z79899 Other long term (current) drug therapy: Secondary | ICD-10-CM | POA: Diagnosis not present

## 2018-08-20 DIAGNOSIS — Z882 Allergy status to sulfonamides status: Secondary | ICD-10-CM | POA: Diagnosis not present

## 2018-08-20 DIAGNOSIS — I5022 Chronic systolic (congestive) heart failure: Secondary | ICD-10-CM | POA: Diagnosis not present

## 2018-08-20 DIAGNOSIS — N183 Chronic kidney disease, stage 3 (moderate): Secondary | ICD-10-CM | POA: Diagnosis not present

## 2018-08-20 DIAGNOSIS — Z7951 Long term (current) use of inhaled steroids: Secondary | ICD-10-CM | POA: Insufficient documentation

## 2018-08-20 DIAGNOSIS — E785 Hyperlipidemia, unspecified: Secondary | ICD-10-CM | POA: Insufficient documentation

## 2018-08-20 DIAGNOSIS — Z833 Family history of diabetes mellitus: Secondary | ICD-10-CM | POA: Diagnosis not present

## 2018-08-20 DIAGNOSIS — Z791 Long term (current) use of non-steroidal anti-inflammatories (NSAID): Secondary | ICD-10-CM | POA: Insufficient documentation

## 2018-08-20 DIAGNOSIS — I428 Other cardiomyopathies: Secondary | ICD-10-CM | POA: Diagnosis not present

## 2018-08-20 DIAGNOSIS — Z7982 Long term (current) use of aspirin: Secondary | ICD-10-CM | POA: Insufficient documentation

## 2018-08-20 DIAGNOSIS — Z8249 Family history of ischemic heart disease and other diseases of the circulatory system: Secondary | ICD-10-CM | POA: Insufficient documentation

## 2018-08-20 DIAGNOSIS — Z880 Allergy status to penicillin: Secondary | ICD-10-CM | POA: Diagnosis not present

## 2018-08-20 DIAGNOSIS — I130 Hypertensive heart and chronic kidney disease with heart failure and stage 1 through stage 4 chronic kidney disease, or unspecified chronic kidney disease: Secondary | ICD-10-CM | POA: Insufficient documentation

## 2018-08-20 DIAGNOSIS — G4733 Obstructive sleep apnea (adult) (pediatric): Secondary | ICD-10-CM

## 2018-08-20 DIAGNOSIS — E1122 Type 2 diabetes mellitus with diabetic chronic kidney disease: Secondary | ICD-10-CM | POA: Insufficient documentation

## 2018-08-20 DIAGNOSIS — I2510 Atherosclerotic heart disease of native coronary artery without angina pectoris: Secondary | ICD-10-CM

## 2018-08-20 DIAGNOSIS — Z794 Long term (current) use of insulin: Secondary | ICD-10-CM | POA: Insufficient documentation

## 2018-08-20 DIAGNOSIS — R0683 Snoring: Secondary | ICD-10-CM

## 2018-08-20 NOTE — Progress Notes (Signed)
Patient Name: Brad Mendoza        DOB: 1955/06/15      Height:     Weight:  Office Name:         Referring Provider:  Today's Date:08/20/18  Date:   STOP BANG RISK ASSESSMENT S (snore) Have you been told that you snore?     YES   T (tired) Are you often tired, fatigued, or sleepy during the day?   YES  O (obstruction) Do you stop breathing, choke, or gasp during sleep? NO   P (pressure) Do you have or are you being treated for high blood pressure? YES   B (BMI) Is your body index greater than 35 kg/m? NO   A (age) Are you 29 years old or older? YES   N (neck) Do you have a neck circumference greater than 16 inches?   NO   G (gender) Are you a male? YES   TOTAL STOP/BANG "YES" ANSWERS                                                                        For Office Use Only              Procedure Order Form    YES to 3+ Stop Bang questions OR two clinical symptoms - patient qualifies for WatchPAT (CPT 95800)     Submit: This Form + Patient Face Sheet + Clinical Note via Cloud[facility] or Fax: [phone]         Clinical Notes: Will consult Sleep Specialist and refer for management of therapy due to patient increased risk of Sleep Apnea. Ordering a sleep study due to the following two clinical symptoms: Excessive daytime sleepiness G47.10 / Gastroesophageal reflux K21.9 / Nocturia R35.1 / Morning Headaches G44.221 / Difficulty concentrating R41.840 / Memory problems or poor judgment G31.84 / Personality changes or irritability R45.4 / Loud snoring R06.83 / Depression F32.9 / Unrefreshed by sleep G47.8 / Impotence N52.9 / History of high blood pressure R03.0 / Insomnia G47.00    I understand that I am proceeding with a home sleep apnea test as ordered by my treating physician. I understand that untreated sleep apnea is a serious cardiovascular risk factor and it is my responsibility to perform the test and seek management for sleep apnea. I will be contacted with the results and be  managed for sleep apnea by a local sleep physician. I will be receiving equipment and further instructions from [facility]. I shall promptly ship back the equipment via the included mailing label. I understand my insurance will be billed for the test and as the patient I am responsible for any insurance related out-of-pocket costs incurred. I have been provided with written instructions and can call for additional video or telephonic instruction, with 24-hour availability of qualified personnel to answer any questions: Patient Help Desk [phone].  Patient Signature ______________________________________________________   Date______________________ Patient Telemedicine Verbal Consent

## 2018-08-20 NOTE — Addendum Note (Signed)
Encounter addended by: Marlise Eves, RN on: 08/20/2018 11:31 AM  Actions taken: Order list changed, Diagnosis association updated, Clinical Note Signed

## 2018-08-20 NOTE — Addendum Note (Signed)
Encounter addended by: Marlise Eves, RN on: 08/20/2018 2:11 PM  Actions taken: Clinical Note Signed

## 2018-08-20 NOTE — Progress Notes (Signed)
Heart Failure TeleHealth Note  Due to national recommendations of social distancing due to [place] 19, Audio/video telehealth visit is felt to be most appropriate for this patient at this time.  See MyChart message from today for patient consent regarding telehealth for [facility].  Date:  08/20/2018   ID:  Brad Mendoza, DOB 1956-02-07, MRN [id number]  Location: Home  Provider location: [place] [facility] Failure Type of Visit: Established patient  PCP:  [name], MD  Cardiologist:  No primary care provider on file. Primary HF: Dr Haroldine Laws  Nephrology: Dr Hinda Lenis  Chief Complaint: Heart Failure   History of Present Illness: Brad Mendoza is a 63 y.o. male with a history of systolic HF due to NICM, HTN, HLD, DM and CAD.  He was admitted from cath lab 03/19/18 with volume overload.Cath showed 90% RCA stenosis (worsened) and heunderwent PCI/DES to RCA by Dr. Ellyn Hack.He was started on Brilinta in addition to his home ASA.Brilinta was provided through [place].HF team was consulted due to cardiorenal syndrome. He was started on milrinone 0.125 mcg/kg/min to augment diuresis. He diuresed with high dose IV lasix and metolazone and transitioned totorsemide 60 mg daily + metolazone 5 mg on Mondays and Fridays.ReDS clip reading improved from 45% to 33%. He was [facility] with [facility] protocol for home. HF medications optimized. Entresto and spiroheld with AKI. Coreg decreased with need for milrinone.DC weight 206 lbs.  Seen in ED 2/26 with high blood sugars 400-500. Given IVF and Toujeo adjusted with instructions to follow up with PCP.  He presents via [employer] (consulting) for a telehealth visit today. Earlier this month  his PCP increased diuretics. Overall feeling ok. Remains SOB with exertion but says this has been stable.  Denies PND/Orthopnea. Appetite ok. No fever or chills. Weight at home 222 pounds. Taking all medications but did not take  medications today.   he denies symptoms worrisome for COVID 19.   Past Medical History:  Diagnosis Date  . Acid reflux   . Back pain    f4 and f5  . CAD (coronary artery disease)   . CHF (congestive heart failure) ([place])    last echo was in March 2016  . Diabetes mellitus 2005  . Diverticulosis   . H/O chest pain 2005   ECHO  . HTN (hypertension)   . Hyperlipidemia   . Nonischemic cardiomyopathy ([place])   . Onychomycosis   . Polysubstance abuse [facility])    Past Surgical History:  Procedure Laterality Date  . APPENDECTOMY    . CARDIAC CATHETERIZATION  2005   4 frech cath  . COLONOSCOPY N/A 08/01/2013   Procedure: COLONOSCOPY;  Surgeon: Rogene Houston, MD;  Location[facility];  Service: Endoscopy;  Laterality: N/A;  rescheduled to [place] notified pt  . CORONARY STENT INTERVENTION N/A 03/19/2018   Procedure: CORONARY STENT INTERVENTION;  Surgeon: Leonie Man, MD;  Location: [place] CV LAB;  Service: Cardiovascular;  Laterality: N/A;  . ESOPHAGOGASTRODUODENOSCOPY N/A 04/23/2015   Procedure: ESOPHAGOGASTRODUODENOSCOPY (EGD);  Surgeon: Rogene Houston, MD;  Location: [facility];  Service: Endoscopy;  Laterality: N/A;  1:25  . RIGHT/LEFT HEART CATH AND CORONARY ANGIOGRAPHY N/A 06/03/2016   Procedure: Right/Left Heart Cath and Coronary Angiography;  Surgeon: Belva Crome, MD;  Location: [place] CV LAB;  Service: Cardiovascular;  Laterality: N/A;  . RIGHT/LEFT HEART CATH AND CORONARY ANGIOGRAPHY N/A 03/19/2018   Procedure: RIGHT/LEFT HEART CATH AND CORONARY ANGIOGRAPHY;  Surgeon: Jolaine Artist, MD;  Location: [place] CV LAB;  Service: Cardiovascular;  Laterality: N/A;     Current Outpatient Medications  Medication Sig Dispense Refill  . aspirin EC 81 MG tablet Take 81 mg by mouth daily.    . Capsaicin (ICY HOT ARTHRITIS THERAPY EX) Apply 1 application topically 2 (two) times daily as needed (back pain).     . carvedilol (COREG) 6.25 MG tablet Take 1 tablet  (6.25 mg total) by mouth 2 (two) times daily with a meal. 60 tablet 5  . Cholecalciferol (VITAMIN D3) 25 MCG (1000 UT) CAPS Take 2,000 Units by mouth daily.    . empagliflozin (JARDIANCE) 10 MG TABS tablet Take 10 mg by mouth daily. 30 tablet 6  . fluticasone (FLONASE) 50 MCG/ACT nasal spray USE 2 SPRAYS IN EACH NOSTRIL DAILY 48 g 1  . glucose blood (ONETOUCH VERIO) test strip USE FOUR TIMES DAILY AS NEEDED 100 each 11  . hydrALAZINE (APRESOLINE) 100 MG tablet Take 1 tablet (100 mg total) by mouth 3 (three) times daily. 90 tablet 5  . Insulin Glargine, 2 Unit Dial, (TOUJEO MAX SOLOSTAR) 300 UNIT/ML SOPN Inject 45 Units into the skin daily before breakfast AND 45 Units daily before supper. 6 pen 11  . isosorbide mononitrate (IMDUR) 120 MG 24 hr tablet Take 1 tablet (120 mg total) by mouth daily. 30 tablet 5  . metFORMIN (GLUCOPHAGE-XR) 750 MG 24 hr tablet TAKE 1 TABLET EVERY MORNING WITH BREAKFAST 90 tablet 0  . metolazone (ZAROXOLYN) 5 MG tablet Take 1 tablet (5 mg total) by mouth 2 (two) times a week. Take on Mondays and Fridays 10 tablet 5  . Multiple Vitamins-Minerals (CENTRUM SILVER ADULT 50+ PO) Take 1 tablet by mouth daily.    . nabumetone (RELAFEN) 500 MG tablet TAKE TWO TABLETS BY MOUTH TWICE DAILY 360 tablet 1  . nitroGLYCERIN (NITROSTAT) 0.4 MG SL tablet DISSOLVE 1 TAB UNDER TOUNGE FOR CHEST PAIN. MAY REPEAT EVERY 5 MINUTES FOR 3 DOSES. IF NO RELIEF CALL 911 OR GO TO ER (Patient taking differently: Place 0.4 mg under the tongue every 5 (five) minutes as needed for chest pain. ) 25 tablet 2  . Omega-3 Fatty Acids (FISH OIL) 1000 MG CPDR Take 1,000 mg by mouth daily.     . pantoprazole (PROTONIX) 40 MG tablet Take 1 tablet (40 mg total) by mouth daily. 90 tablet 1  . pregabalin (LYRICA) 225 MG capsule Take 1 capsule (225 mg total) by mouth at bedtime. 30 capsule 2  . PROAIR HFA 108 (90 Base) MCG/ACT inhaler USE 2 PUFFS EVERY 6 HOURS AS NEEDED 8.5 g 3  . ranolazine (RANEXA) 500 MG 12 hr  tablet TAKE ONE TABLET BY MOUTH TWICE DAILY 60 tablet 5  . rosuvastatin (CRESTOR) 10 MG tablet Take 1 tablet (10 mg total) by mouth daily. 90 tablet 1  . terbinafine (LAMISIL) 1 % cream Apply 1 application topically 2 (two) times daily. Apply to both feet and between toes 30 g 1  . torsemide (DEMADEX) 20 MG tablet Take 3 tablets (60 mg total) by mouth daily. 90 tablet 5   Current Facility-Administered Medications  Medication Dose Route Frequency Provider Last Rate Last Dose  . aflibercept (EYLEA) SOLN 2 mg  2 mg Intravitreal  [name], MD   2 mg at 04/25/18 0029    Allergies:   Sulfa antibiotics and Penicillins   Social History:  The patient  reports that he has never smoked. His smokeless tobacco use  includes snuff. He reports current alcohol use of about 1.0 standard drinks of alcohol per week. He reports current drug use. Drug: Marijuana.   Family History:  The patient's family history includes Alcohol abuse in his brother; Cancer in his mother; Diabetes in his father; Hypertension in his father.   ROS:  Please see the history of present illness.   All other systems are personally reviewed and negative.   Exam:  Tele Health Call; Exam is subjective General:  Speaks in full sentences. No resp difficulty. Lungs: Normal respiratory effort with conversation.  Abdomen: Non-distended per patient report Extremities: Says he has some leg edema.  Neuro: Alert & oriented x 3.   Recent Labs: 03/26/2018: Magnesium 2.2 04/10/2018: BNP 133.3 04/18/2018: Hemoglobin 11.2; Platelets 214 07/27/2018: ALT 24; BUN 55; Creatinine, Ser 3.00; Potassium 4.1; Sodium 139  Personally reviewed   Wt Readings from Last 3 Encounters:  08/20/18 100.7 kg (222 lb)  07/27/18 95.3 kg (210 lb)  07/18/18 95.3 kg (210 lb)      ASSESSMENT AND PLAN:   1. 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 He had preexisting NICM diagnosed in 2005 when his EF was 35% and had normal cors. Echo in 05/2016 with EF 30-35% and  cath showed 70-75% stenosis in the proximal and mid LAD,90% ostial stenosis in the 1st diagonal, and 60-70% stenosis in the mid RCA. [place] 05/2016- Fick C.O. 5.86/index 2.74 - Echo from 4/19 reviewed personally EF read as 40-45% but it is more like 25% - Echo1/20/20EF 30-35% - NYHA III chronically. Continue torsemide 80  mg daily. Weight up today but says he has not had his diuretics today. He does not want to increase torsemide today.  -Continue coreg 6.25 mg BID. - Continue imdur 120 mg daily.  -Continuehydralazine 100 mg TID Arlyce Harman on hold with AKI.Brad Mendoza has been on hold withCKD  - Willeventually need ICD if EF remains <35% post PCI in 3 months.  - Set up for repeat ECHO today   2. CAD -s/p PCI/DES of RCA 03/19/2018. Other CAD stable. Manage medically -No chest pain.  - Continue isosorbide as above.  - Continue Ranexa. - ContinueDAPTand statin  3. ETOH abuse - Congratulated him on cessation.  4. Tobacco abuse He continue dip tobacco.  - Encouraged complete cessation.  5. HTN   6. CKD stage III - Baseline creatinine 1.7-2.2. Follows with Dr. Hinda Lenis  7. DM -Per PCP. - Avoiding SGLT2i with CRCl < 30  8. Fatigue/snoring-  Set up for home sleep study.   COVID screen The patient does not have any symptoms that suggest any further testing/ screening at this time.  Social distancing reinforced today.  Patient Risk: After full review of this patients clinical status, I feel that they are at moderate risk for cardiac decompensation at this time.  Relevant cardiac medications were reviewed at length with the patient today. The patient does not have concerns regarding their medications at this time.   The following changes were made today: none   Recommended follow-up:  F/U with Dr Haroldine Laws in 2 months with an ECHO. Set up for home sleep study.   Today, I have spent 15, minutes with the patient with telehealth technology discussing the  above issues .    Jeanmarie Hubert, NP  08/20/2018 11:06 AM  [place] [address] [facility] [place] [address] [phone] (office) [phone] (fax)

## 2018-08-20 NOTE — Progress Notes (Signed)
itamar sleep study order, stopbang survey, and NP note sent to [facility] via fax

## 2018-08-20 NOTE — Patient Instructions (Signed)
Your provider has recommended that you have a home sleep study.  [employer] is the company that provides these and will send the equipment right to your home with instructions on how to set it up.  Once you have completed the test you just dispose of the equipment, the information is automatically uploaded to [place].  If your test was positive and you need a home CPAP machine you will be contacted by Dr Theodosia Blender office [facility]) to set this up.  Your physician has requested that you have an echocardiogram. Echocardiography is a painless test that uses sound waves to create images of your heart. It provides your doctor with information about the size and shape of your heart and how well your heart's chambers and valves are working. This procedure takes approximately one hour. There are no restrictions for this procedure. This will be done at your follow up appointment.  Please follow up with the [facility] in 6 weeks with an echocardiogram.  At the [facility], you and your health needs are our priority. As part of our continuing mission to provide you with exceptional heart care, we have created designated Provider Care Teams. These Care Teams include your primary Cardiologist (physician) and Advanced Practice Providers (APPs- Physician Assistants and Nurse Practitioners) who all work together to provide you with the care you need, when you need it.   You may see any of the following providers on your designated Care Team at your next follow up: [name] Dr Glori Bickers . Dr Loralie Champagne . Darrick Grinder, NP

## 2018-08-21 DIAGNOSIS — Z794 Long term (current) use of insulin: Secondary | ICD-10-CM | POA: Diagnosis not present

## 2018-08-21 DIAGNOSIS — I5023 Acute on chronic systolic (congestive) heart failure: Secondary | ICD-10-CM | POA: Diagnosis not present

## 2018-08-21 DIAGNOSIS — I130 Hypertensive heart and chronic kidney disease with heart failure and stage 1 through stage 4 chronic kidney disease, or unspecified chronic kidney disease: Secondary | ICD-10-CM | POA: Diagnosis not present

## 2018-08-21 DIAGNOSIS — K219 Gastro-esophageal reflux disease without esophagitis: Secondary | ICD-10-CM | POA: Diagnosis not present

## 2018-08-21 DIAGNOSIS — I272 Pulmonary hypertension, unspecified: Secondary | ICD-10-CM | POA: Diagnosis not present

## 2018-08-21 DIAGNOSIS — N183 Chronic kidney disease, stage 3 (moderate): Secondary | ICD-10-CM | POA: Diagnosis not present

## 2018-08-21 DIAGNOSIS — Z7982 Long term (current) use of aspirin: Secondary | ICD-10-CM | POA: Diagnosis not present

## 2018-08-21 DIAGNOSIS — E1122 Type 2 diabetes mellitus with diabetic chronic kidney disease: Secondary | ICD-10-CM | POA: Diagnosis not present

## 2018-08-21 DIAGNOSIS — I2510 Atherosclerotic heart disease of native coronary artery without angina pectoris: Secondary | ICD-10-CM | POA: Diagnosis not present

## 2018-08-21 DIAGNOSIS — I255 Ischemic cardiomyopathy: Secondary | ICD-10-CM | POA: Diagnosis not present

## 2018-08-21 DIAGNOSIS — Z9181 History of falling: Secondary | ICD-10-CM | POA: Diagnosis not present

## 2018-08-21 DIAGNOSIS — E785 Hyperlipidemia, unspecified: Secondary | ICD-10-CM | POA: Diagnosis not present

## 2018-08-21 NOTE — Progress Notes (Signed)
[facility] Note  08/22/2018     CHIEF COMPLAINT Patient presents for Retina Follow Up   HISTORY OF PRESENT ILLNESS: Brad Mendoza is a 63 y.o. male who presents to the clinic today for:   HPI    Retina Follow Up    Patient presents with  Diabetic Retinopathy.  In both eyes.  Severity is moderate.  Duration of 12 weeks.  I, the attending physician,  performed the HPI with the patient and updated documentation appropriately.          Comments    Patient states unsure if any vision changes. BS was 148 this am. Last a1c was 13.1 in June 2020.        Last edited by [name], MD on 08/22/2018  9:18 AM. (History)     pt states he is delayed to follow up due to [place] restrictions, pt states he feels like his vision is worse, he states his sugar has been running high bc he has not been eating well   Referring physician: [name], MD [address],  [place] [address]  HISTORICAL INFORMATION:   Selected notes from the MEDICAL RECORD NUMBER Referred by Dr. [name] for concern of CSME OS;  LEE- 04.04.19 ([name]) [BCVA OD: 20/20 OS: 20/40] Ocular Hx-  PMH- DM, CAD, CHF, HTN, hyperlipidemia    CURRENT MEDICATIONS: No current facility-administered medications for this visit.  (Ophthalmic Drugs)   No current outpatient medications on file. (Ophthalmic Drugs)   No current facility-administered medications for this visit.  (Other)   Current Outpatient Medications (Other)  Medication Sig  . aspirin EC 81 MG tablet Take 81 mg by mouth daily.  . Capsaicin (ICY HOT ARTHRITIS THERAPY EX) Apply 1 application topically 2 (two) times daily as needed (back pain).   . carvedilol (COREG) 6.25 MG tablet Take 1 tablet (6.25 mg total) by mouth 2 (two) times daily with a meal.  . Cholecalciferol (VITAMIN D3) 25 MCG (1000 UT) CAPS Take 2,000 Units by mouth daily.  . empagliflozin (JARDIANCE) 10 MG TABS tablet Take 10 mg by mouth daily.  . fluticasone  (FLONASE) 50 MCG/ACT nasal spray USE 2 SPRAYS IN EACH NOSTRIL DAILY  . glucose blood (ONETOUCH VERIO) test strip USE FOUR TIMES DAILY AS NEEDED  . hydrALAZINE (APRESOLINE) 100 MG tablet Take 1 tablet (100 mg total) by mouth 3 (three) times daily.  . Insulin Glargine, 2 Unit Dial, (TOUJEO MAX SOLOSTAR) 300 UNIT/ML SOPN Inject 45 Units into the skin daily before breakfast AND 45 Units daily before supper.  . isosorbide mononitrate (IMDUR) 120 MG 24 hr tablet Take 1 tablet (120 mg total) by mouth daily.  . metFORMIN (GLUCOPHAGE-XR) 750 MG 24 hr tablet TAKE 1 TABLET EVERY MORNING WITH BREAKFAST  . metolazone (ZAROXOLYN) 5 MG tablet Take 1 tablet (5 mg total) by mouth 2 (two) times a week. Take on Mondays and Fridays  . Multiple Vitamins-Minerals (CENTRUM SILVER ADULT 50+ PO) Take 1 tablet by mouth daily.  . nabumetone (RELAFEN) 500 MG tablet TAKE TWO TABLETS BY MOUTH TWICE DAILY  . nitroGLYCERIN (NITROSTAT) 0.4 MG SL tablet DISSOLVE 1 TAB UNDER TOUNGE FOR CHEST PAIN. MAY REPEAT EVERY 5 MINUTES FOR 3 DOSES. IF NO RELIEF CALL 911 OR GO TO ER (Patient taking differently: Place 0.4 mg under the tongue every 5 (five) minutes as needed for chest pain. )  . Omega-3 Fatty Acids (FISH OIL) 1000 MG CPDR Take 1,000 mg  by mouth daily.   . pantoprazole (PROTONIX) 40 MG tablet Take 1 tablet (40 mg total) by mouth daily.  . pregabalin (LYRICA) 225 MG capsule Take 1 capsule (225 mg total) by mouth at bedtime.  [name] PROAIR HFA 108 (90 Base) MCG/ACT inhaler USE 2 PUFFS EVERY 6 HOURS AS NEEDED  . ranolazine (RANEXA) 500 MG 12 hr tablet TAKE ONE TABLET BY MOUTH TWICE DAILY  . rosuvastatin (CRESTOR) 10 MG tablet Take 1 tablet (10 mg total) by mouth daily.  [name] terbinafine (LAMISIL) 1 % cream Apply 1 application topically 2 (two) times daily. Apply to both feet and between toes  . torsemide (DEMADEX) 20 MG tablet Take 3 tablets (60 mg total) by mouth daily. (Patient taking differently: Take 80 mg by mouth daily. )    Facility-Administered Medications Ordered in Other Visits (Other)  Medication Route  . torsemide (DEMADEX) tablet 80 mg Oral      REVIEW OF SYSTEMS: ROS    Positive for: Endocrine, Cardiovascular, Eyes   Negative for: Constitutional, Gastrointestinal, Neurological, Skin, Genitourinary, Musculoskeletal, HENT, Respiratory, Psychiatric, Allergic/Imm, Heme/Lymph   Last edited by Roselee Nova D on 08/22/2018  7:32 AM. (History)       ALLERGIES Allergies  Allergen Reactions  . Sulfa Antibiotics Shortness Of Breath  . Penicillins Rash and Other (See Comments)    Has patient had a PCN reaction causing immediate rash, facial/tongue/throat swelling, SOB or lightheadedness with hypotension: No Has patient had a PCN reaction causing severe rash involving mucus membranes or skin necrosis: No Has patient had a PCN reaction that required hospitalization No Has patient had a PCN reaction occurring within the last 10 years: No If all of the above answers are "NO", then may proceed with Cephalosporin use.     PAST MEDICAL HISTORY Past Medical History:  Diagnosis Date  . Acid reflux   . Back pain    f4 and f5  . CAD (coronary artery disease)   . CHF (congestive heart failure) ([place])    last echo was in March 2016  . Diabetes mellitus 2005  . Diverticulosis   . H/O chest pain 2005   ECHO  . HTN (hypertension)   . Hyperlipidemia   . Nonischemic cardiomyopathy ([place])   . Onychomycosis   . Polysubstance abuse [facility])    Past Surgical History:  Procedure Laterality Date  . APPENDECTOMY    . CARDIAC CATHETERIZATION  2005   4 frech cath  . COLONOSCOPY N/A 08/01/2013   Procedure: COLONOSCOPY;  Surgeon: Rogene Houston, MD;  [facility];  Service: Endoscopy;  Laterality: N/A;  rescheduled to [place] notified pt  . CORONARY STENT INTERVENTION N/A 03/19/2018   Procedure: CORONARY STENT INTERVENTION;  Surgeon: Leonie Man, MD;  Location: [place] [facility];  Service:  Cardiovascular;  Laterality: N/A;  . ESOPHAGOGASTRODUODENOSCOPY N/A 04/23/2015   Procedure: ESOPHAGOGASTRODUODENOSCOPY (EGD);  Surgeon: Rogene Houston, MD;  [facility];  Service: Endoscopy;  Laterality: N/A;  1:25  . RIGHT/LEFT HEART CATH AND CORONARY ANGIOGRAPHY N/A 06/03/2016   Procedure: Right/Left Heart Cath and Coronary Angiography;  Surgeon: Belva Crome, MD;  Location: [place] [facility];  Service: Cardiovascular;  Laterality: N/A;  . RIGHT/LEFT HEART CATH AND CORONARY ANGIOGRAPHY N/A 03/19/2018   Procedure: RIGHT/LEFT HEART CATH AND CORONARY ANGIOGRAPHY;  Surgeon: Jolaine Artist, MD;  Location: [place] [facility];  Service: Cardiovascular;  Laterality: N/A;    FAMILY HISTORY Family History  Problem Relation Age of Onset  .  Hypertension Father   . Diabetes Father   . Cancer Mother   . Alcohol abuse Brother   . Colon cancer Neg Hx     SOCIAL HISTORY Social History   Tobacco Use  . Smoking status: Never Smoker  . Smokeless tobacco: Current User    Types: Snuff  . Tobacco comment: dips snuff about 6 cans/week for 3 years  Substance Use Topics  . Alcohol use: Yes    Alcohol/week: 1.0 standard drinks    Types: 1 Cans of beer per week    Comment: occ - has cut down, now once every 2-3 months  . Drug use: Yes    Types: Marijuana    Comment: occasional use - smoking less         OPHTHALMIC EXAM:  Base Eye Exam    Visual Acuity (Snellen - Linear)      Right Left   Dist cc 20/40 -1 20/40   Dist ph cc NI NI   Correction: Glasses       Tonometry (Tonopen, 7:45 AM)      Right Left   Pressure 14 17       Pupils      Dark Light Shape React APD   Right 3 2 Round Brisk None   Left 3 2 Round Brisk None       Visual Strome (Counting fingers)      Left Right    Full Full       Extraocular Movement      Right Left    Full, Ortho Full, Ortho       Neuro/Psych    Oriented x3: Yes   Mood/Affect: Normal       Dilation    Both eyes: 1.0%  Mydriacyl, 2.5% Phenylephrine @ 7:45 AM        Slit Lamp and Fundus Exam    Slit Lamp Exam      Right Left   Lids/Lashes Dermatochalasis - upper lid Dermatochalasis - upper lid   Conjunctiva/Sclera Melanosis Melanosis   Cornea Arcus, trace inferior and central Punctate epithelial erosions Arcus, trace inferior and central Punctate epithelial erosions, round k scar inferonasal para-central   Anterior Chamber Deep and quiet Deep and quiet   Iris Round and dilated, No NVI Round and dilated, No NVI   Lens 2+ Nuclear sclerosis, 2+ Cortical cataract, Vacuoles 2+ Nuclear sclerosis, 2+ Cortical cataract, Vacuoles   Vitreous Vitreous syneresis, prominent vitreous base nasally  Vitreous syneresis, prominent vitreous base nasally       Fundus Exam      Right Left   Disc mild Pallor, Sharp rim mild Pallor, Sharp rim   C/D Ratio 0.4 0.4   Macula blunted foveal reflex, central cystic edema, scattered Microaneurysms, RPE mottling Blunted foveal reflex, +persistent edema - mild interval increase, scattered MAs, scattered DBH, scattered exudates -- improved, CWS along ST arcades   Vessels Mild Vascular attenuation, Tortuous Mild Vascular attenuation   Periphery Attached, scattered MAs, scattered White without pressure Attached, scattered DBH, scattered CWS -- improved, scattered White without pressure        Refraction    Wearing Rx      Sphere Cylinder Axis Add   Right -0.25 +1.00 019 +2.50   Left -0.25 +1.00 153 +2.50          IMAGING AND PROCEDURES  Imaging and Procedures for 05/31/17  OCT, Retina - OU - Both Eyes       Right Eye Quality was good. Central  Foveal Thickness: 349. Progression has worsened. Findings include no SRF, intraretinal fluid, abnormal foveal contour (Mild Interval increase in IRF; diffuse atrophy outside areas of edema).   Left Eye Quality was good. Central Foveal Thickness: 282. Progression has worsened. Findings include normal foveal contour, intraretinal  fluid, no SRF (Mild interval increase in IRF; diffuse atrophy in areas outside edema).   Notes *Images captured and stored on drive  Diagnosis / Impression:  DME OU, OS>OD Mild Interval increase in IRF OD Mild interval increase in IRF OS  Clinical management:  See below  Abbreviations: NFP - Normal foveal profile. CME - cystoid macular edema. PED - pigment epithelial detachment. IRF - intraretinal fluid. SRF - subretinal fluid. EZ - ellipsoid zone. ERM - epiretinal membrane. ORA - outer retinal atrophy. ORT - outer retinal tubulation. SRHM - subretinal hyper-reflective material         Intravitreal Injection, Pharmacologic Agent - OS - Left Eye       Time Out 08/22/2018. 8:36 AM. Confirmed correct patient, procedure, site, and patient consented.   Anesthesia Topical anesthesia was used. Anesthetic medications included Lidocaine 2%, Proparacaine 0.5%.   Procedure Preparation included 5% betadine to ocular surface, eyelid speculum. A 30 gauge needle was used.   Injection:  2 mg aflibercept Alfonse Flavors) SOLN   NDC: A3590391, Lot: [id number], Expiration date: 04/13/2019   Route: Intravitreal, Site: Left Eye, Waste: 0.05 mL  Post-op Post injection exam found visual acuity of at least counting fingers. The patient tolerated the procedure well. There were no complications. The patient received written and verbal post procedure care education.        Intravitreal Injection, Pharmacologic Agent - OD - Right Eye       Time Out 08/22/2018. 8:57 AM. Confirmed correct patient, procedure, site, and patient consented.   Anesthesia Topical anesthesia was used. Anesthetic medications included Lidocaine 2%, Proparacaine 0.5%.   Procedure Preparation included 5% betadine to ocular surface, eyelid speculum. A supplied needle was used.   Injection:  1.25 mg Bevacizumab (AVASTIN) SOLN   NDC: 97948-016-55, Lot: [id number] , Expiration date: 10/03/2018   Route: Intravitreal, Site: Right  Eye, Waste: 0 mL  Post-op Post injection exam found visual acuity of at least counting fingers. The patient tolerated the procedure well. There were no complications. The patient received written and verbal post procedure care education.                 ASSESSMENT/PLAN:    ICD-10-CM   1. Moderate nonproliferative diabetic retinopathy of both eyes with macular edema associated with type 2 diabetes mellitus (HCC)  21/01/2019 Intravitreal Injection, Pharmacologic Agent - OS - Left Eye    aflibercept (EYLEA) SOLN 2 mg    Intravitreal Injection, Pharmacologic Agent - OD - Right Eye    Bevacizumab (AVASTIN) SOLN 1.25 mg  2. Retinal edema  H35.81 OCT, Retina - OU - Both Eyes  3. Essential hypertension  I10   4. Hypertensive retinopathy of both eyes  H35.033   5. Combined forms of age-related cataract of both eyes  H25.813     1,2. Moderate to severe non-proliferative diabetic retinopathy, OU  - initial exam showed scattered IRH, exudates, and cotton wool spots, OS > OD, no NV  - FA 4.10.19 shows no NV OU  - S/P IVA OS #1 (05.29.19), #2 (06.26.19), #3 (07.29.19)  - S/P IVE OS #1 (08.30.19), #2 (10.01.19), #3 (10.31.19), #4 (03.02.20), #5 (04.07.20)  - S/P IVA OD #1 (04.07.20)  - delayed f/u due to COVID  19 restrictions and concerns  - last A1c 13.1% on 6.5.2020 -- pt reports poor control due to poor diet  - OCT shows interval increase in diabetic macular edema OU  - BCVA decreased to 20/40 from 20/30 OD and stable at 20/40 OS  - recommend IVA #2 OD and IVE #6 OS today (07.01.20)  - pt wishes to proceed  - RBA of procedure discussed, questions answered  - informed consent obtained and signed  - see procedure note  - Eylea4U paper work and benefits investigation started on 07.29.19 -- Approved for Good Days for 2020  - f/u in 4-6 weeks -- DFE; OCT; possible injection  3,4. Hypertensive retinopathy OU  - discussed importance of tight BP control  - monitor  5. Combined form  age-related cataract OU-   - The symptoms of cataract, surgical options, and treatments and risks were discussed with patient.  - discussed diagnosis and progression  - not yet visually significant  - monitor for now   Ophthalmic Meds Ordered this visit:  Meds ordered this encounter  Medications  . aflibercept (EYLEA) SOLN 2 mg  . Bevacizumab (AVASTIN) SOLN 1.25 mg       Return in about 6 weeks (around 10/03/2018) for f/u NPDR OU, DFE, OCT.  There are no Patient Instructions on file for this visit.   Explained the diagnoses, plan, and follow up with the patient and they expressed understanding.  Patient expressed understanding of the importance of proper follow up care.   This document serves as a record of services personally performed by Gardiner Sleeper, MD, PhD. It was created on their behalf by Ernest Mallick, OA, an ophthalmic assistant. The creation of this record is the provider's dictation and/or activities during the visit.    Electronically signed by: Ernest Mallick, OA  06.30.2020 1:20 AM    Gardiner Sleeper, M.D., Ph.D. Diseases & Surgery of the Retina and Vitreous [facility]  I have reviewed the above documentation for accuracy and completeness, and I agree with the above. Gardiner Sleeper, M.D., Ph.D. 08/26/18 1:23 AM   Abbreviations: M myopia (nearsighted); A astigmatism; H hyperopia (farsighted); P presbyopia; Mrx spectacle prescription;  CTL contact lenses; OD right eye; OS left eye; OU both eyes  XT exotropia; ET esotropia; PEK punctate epithelial keratitis; PEE punctate epithelial erosions; DES dry eye syndrome; MGD meibomian gland dysfunction; ATs artificial tears; PFAT's preservative free artificial tears; [place] nuclear sclerotic cataract; PSC posterior subcapsular cataract; ERM epi-retinal membrane; PVD posterior vitreous detachment; RD retinal detachment; DM diabetes mellitus; DR diabetic retinopathy; NPDR non-proliferative diabetic  retinopathy; PDR proliferative diabetic retinopathy; CSME clinically significant macular edema; DME diabetic macular edema; dbh dot blot hemorrhages; CWS cotton wool spot; POAG primary open angle glaucoma; C/D cup-to-disc ratio; HVF humphrey visual field; GVF goldmann visual field; OCT optical coherence tomography; IOP intraocular pressure; BRVO Branch retinal vein occlusion; CRVO central retinal vein occlusion; CRAO central retinal artery occlusion; BRAO branch retinal artery occlusion; RT retinal tear; SB scleral buckle; PPV pars plana vitrectomy; VH Vitreous hemorrhage; PRP panretinal laser photocoagulation; IVK intravitreal kenalog; VMT vitreomacular traction; MH Macular hole;  NVD neovascularization of the disc; NVE neovascularization elsewhere; AREDS age related eye disease study; ARMD age related macular degeneration; POAG primary open angle glaucoma; EBMD epithelial/anterior basement membrane dystrophy; ACIOL anterior chamber intraocular lens; IOL intraocular lens; PCIOL posterior chamber intraocular lens; Phaco/IOL phacoemulsification with intraocular lens placement; PRK photorefractive keratectomy; LASIK laser assisted in situ keratomileusis; HTN hypertension; DM diabetes  mellitus; COPD chronic obstructive pulmonary disease

## 2018-08-22 ENCOUNTER — Ambulatory Visit (INDEPENDENT_AMBULATORY_CARE_PROVIDER_SITE_OTHER): Payer: Medicare Other | Admitting: Ophthalmology

## 2018-08-22 ENCOUNTER — Other Ambulatory Visit: Payer: Self-pay

## 2018-08-22 ENCOUNTER — Encounter (INDEPENDENT_AMBULATORY_CARE_PROVIDER_SITE_OTHER): Payer: Self-pay | Admitting: Ophthalmology

## 2018-08-22 DIAGNOSIS — E113313 Type 2 diabetes mellitus with moderate nonproliferative diabetic retinopathy with macular edema, bilateral: Secondary | ICD-10-CM

## 2018-08-22 DIAGNOSIS — H25813 Combined forms of age-related cataract, bilateral: Secondary | ICD-10-CM

## 2018-08-22 DIAGNOSIS — H35033 Hypertensive retinopathy, bilateral: Secondary | ICD-10-CM

## 2018-08-22 DIAGNOSIS — I10 Essential (primary) hypertension: Secondary | ICD-10-CM

## 2018-08-22 DIAGNOSIS — H3581 Retinal edema: Secondary | ICD-10-CM | POA: Diagnosis not present

## 2018-08-22 MED ORDER — AFLIBERCEPT 2MG/0.05ML IZ SOLN FOR KALEIDOSCOPE
2.0000 mg | INTRAVITREAL | Status: AC | PRN
  Administered 2018-08-22: 2 mg via INTRAVITREAL

## 2018-08-22 MED ORDER — BEVACIZUMAB CHEMO INJECTION 1.25MG/0.05ML SYRINGE FOR KALEIDOSCOPE
1.2500 mg | INTRAVITREAL | Status: AC | PRN
  Administered 2018-08-22: 09:00:00 1.25 mg via INTRAVITREAL

## 2018-08-25 ENCOUNTER — Emergency Department (HOSPITAL_COMMUNITY): Payer: Medicare Other

## 2018-08-25 ENCOUNTER — Other Ambulatory Visit: Payer: Self-pay

## 2018-08-25 ENCOUNTER — Emergency Department (HOSPITAL_COMMUNITY)
Admission: EM | Admit: 2018-08-25 | Discharge: 2018-08-26 | Discharge status: Against Medical Advice | Payer: Medicare Other | Attending: Emergency Medicine | Admitting: Emergency Medicine

## 2018-08-25 ENCOUNTER — Encounter (HOSPITAL_COMMUNITY): Payer: Self-pay | Admitting: *Deleted

## 2018-08-25 DIAGNOSIS — I110 Hypertensive heart disease with heart failure: Secondary | ICD-10-CM | POA: Insufficient documentation

## 2018-08-25 DIAGNOSIS — I509 Heart failure, unspecified: Secondary | ICD-10-CM | POA: Diagnosis not present

## 2018-08-25 DIAGNOSIS — E119 Type 2 diabetes mellitus without complications: Secondary | ICD-10-CM | POA: Diagnosis not present

## 2018-08-25 DIAGNOSIS — F17290 Nicotine dependence, other tobacco product, uncomplicated: Secondary | ICD-10-CM | POA: Insufficient documentation

## 2018-08-25 DIAGNOSIS — R0689 Other abnormalities of breathing: Secondary | ICD-10-CM | POA: Diagnosis not present

## 2018-08-25 DIAGNOSIS — Z7984 Long term (current) use of oral hypoglycemic drugs: Secondary | ICD-10-CM | POA: Diagnosis not present

## 2018-08-25 DIAGNOSIS — Z79899 Other long term (current) drug therapy: Secondary | ICD-10-CM | POA: Diagnosis not present

## 2018-08-25 DIAGNOSIS — R0602 Shortness of breath: Secondary | ICD-10-CM | POA: Diagnosis not present

## 2018-08-25 DIAGNOSIS — E1140 Type 2 diabetes mellitus with diabetic neuropathy, unspecified: Secondary | ICD-10-CM | POA: Diagnosis not present

## 2018-08-25 DIAGNOSIS — I2510 Atherosclerotic heart disease of native coronary artery without angina pectoris: Secondary | ICD-10-CM | POA: Insufficient documentation

## 2018-08-25 DIAGNOSIS — Z7982 Long term (current) use of aspirin: Secondary | ICD-10-CM | POA: Insufficient documentation

## 2018-08-25 DIAGNOSIS — I5023 Acute on chronic systolic (congestive) heart failure: Secondary | ICD-10-CM | POA: Diagnosis not present

## 2018-08-25 DIAGNOSIS — I10 Essential (primary) hypertension: Secondary | ICD-10-CM | POA: Diagnosis not present

## 2018-08-25 DIAGNOSIS — I5041 Acute combined systolic (congestive) and diastolic (congestive) heart failure: Secondary | ICD-10-CM | POA: Diagnosis not present

## 2018-08-25 DIAGNOSIS — R609 Edema, unspecified: Secondary | ICD-10-CM | POA: Diagnosis not present

## 2018-08-25 LAB — CBG MONITORING, ED: Glucose-Capillary: 168 mg/dL — ABNORMAL HIGH (ref 70–99)

## 2018-08-25 NOTE — ED Notes (Signed)
Patient states that he thought that maybe he took some of his medication to closely. Patient states that possibly could be his blood sugar. Checked CBG 168

## 2018-08-25 NOTE — ED Triage Notes (Signed)
Pt brought in by [facility] for c/o sob and bilateral lower leg edema that started today

## 2018-08-26 ENCOUNTER — Encounter: Payer: Self-pay | Admitting: Family Medicine

## 2018-08-26 DIAGNOSIS — R0602 Shortness of breath: Secondary | ICD-10-CM | POA: Diagnosis not present

## 2018-08-26 DIAGNOSIS — I5041 Acute combined systolic (congestive) and diastolic (congestive) heart failure: Secondary | ICD-10-CM | POA: Diagnosis not present

## 2018-08-26 LAB — COMPREHENSIVE METABOLIC PANEL
ALT: 47 U/L — ABNORMAL HIGH (ref 0–44)
AST: 30 U/L (ref 15–41)
Albumin: 3 g/dL — ABNORMAL LOW (ref 3.5–5.0)
Alkaline Phosphatase: 79 U/L (ref 38–126)
Anion gap: 10 (ref 5–15)
BUN: 62 mg/dL — ABNORMAL HIGH (ref 8–23)
CO2: 22 mmol/L (ref 22–32)
Calcium: 8.4 mg/dL — ABNORMAL LOW (ref 8.9–10.3)
Chloride: 109 mmol/L (ref 98–111)
Creatinine, Ser: 1.91 mg/dL — ABNORMAL HIGH (ref 0.61–1.24)
GFR calc Af Amer: 43 mL/min — ABNORMAL LOW (ref >60)
GFR calc non Af Amer: 37 mL/min — ABNORMAL LOW (ref >60)
Glucose, Bld: 170 mg/dL — ABNORMAL HIGH (ref 70–99)
Potassium: 4.1 mmol/L (ref 3.5–5.1)
Sodium: 141 mmol/L (ref 135–145)
Total Bilirubin: 0.6 mg/dL (ref 0.3–1.2)
Total Protein: 5.9 g/dL — ABNORMAL LOW (ref 6.5–8.1)

## 2018-08-26 LAB — CBC WITH DIFFERENTIAL/PLATELET
Abs Immature Granulocytes: 0.08 10*3/uL — ABNORMAL HIGH (ref 0.00–0.07)
Basophils Absolute: 0.1 10*3/uL (ref 0.0–0.1)
Basophils Relative: 1 %
Eosinophils Absolute: 0.1 10*3/uL (ref 0.0–0.5)
Eosinophils Relative: 1 %
HCT: 31.5 % — ABNORMAL LOW (ref 39.0–52.0)
Hemoglobin: 10.2 g/dL — ABNORMAL LOW (ref 13.0–17.0)
Immature Granulocytes: 1 %
Lymphocytes Relative: 8 %
Lymphs Abs: 0.8 10*3/uL (ref 0.7–4.0)
MCH: 27.7 pg (ref 26.0–34.0)
MCHC: 32.4 g/dL (ref 30.0–36.0)
MCV: 85.6 fL (ref 80.0–100.0)
Monocytes Absolute: 1.2 10*3/uL — ABNORMAL HIGH (ref 0.1–1.0)
Monocytes Relative: 11 %
Neutro Abs: 8.8 10*3/uL — ABNORMAL HIGH (ref 1.7–7.7)
Neutrophils Relative %: 78 %
Platelets: 212 10*3/uL (ref 150–400)
RBC: 3.68 MIL/uL — ABNORMAL LOW (ref 4.22–5.81)
RDW: 14.5 % (ref 11.5–15.5)
WBC: 11.1 10*3/uL — ABNORMAL HIGH (ref 4.0–10.5)
nRBC: 0 % (ref 0.0–0.2)

## 2018-08-26 LAB — TROPONIN I (HIGH SENSITIVITY)
Troponin I (High Sensitivity): 26 ng/L — ABNORMAL HIGH (ref <18)
Troponin I (High Sensitivity): 32 ng/L — ABNORMAL HIGH (ref <18)
Troponin I (High Sensitivity): 34 ng/L — ABNORMAL HIGH (ref <18)

## 2018-08-26 LAB — BRAIN NATRIURETIC PEPTIDE: B Natriuretic Peptide: 494 pg/mL — ABNORMAL HIGH (ref 0.0–100.0)

## 2018-08-26 MED ORDER — NITROGLYCERIN 2 % TD OINT
1.0000 [in_us] | TOPICAL_OINTMENT | Freq: Once | TRANSDERMAL | Status: AC
  Administered 2018-08-26: 1 [in_us] via TOPICAL
  Filled 2018-08-26: qty 1

## 2018-08-26 MED ORDER — TORSEMIDE 20 MG PO TABS
ORAL_TABLET | ORAL | Status: AC
  Filled 2018-08-26: qty 4

## 2018-08-26 MED ORDER — TORSEMIDE 20 MG PO TABS
80.0000 mg | ORAL_TABLET | Freq: Every day | ORAL | Status: DC
  Administered 2018-08-26: 80 mg via ORAL
  Filled 2018-08-26 (×2): qty 4

## 2018-08-26 NOTE — ED Notes (Signed)
Pt removed all cardiac leads and vital sign monitoring devices.

## 2018-08-26 NOTE — Consult Note (Signed)
Medical Consultation   Brad Mendoza  BSJ:[id number]  DOB: 10/14/1955  DOA: 08/25/2018  PCP: [name], MD   Requesting physician: Dr. Tomi Bamberger  Reason for consultation: Hospital admission for CHF  History of Present Illness: Brad Mendoza is an 63 y.o. male with history of systolic and diastolic CHF with prior echo LVEF 30-35% in 02/2018, hypertension, dyslipidemia, CAD and diabetes who presented to the emergency department with worsening shortness of breath over the last 1 to 2 days along with some as well as lower extremity edema.  He states that he forgot to take his torsemide yesterday and also the day before.  He had some sausage as well as increased fluid intake and high sodium containing foods.  He has had some PND as well as dyspnea on exertion and some sharp left mid chest pain that began earlier today.  He is also had some minimal wheezing, but no cough, fever, or chills noted.  He has had some lower extremity edema in his abdomen was minimally distended.  He tried to get some nitroglycerin at home with no relief noted.  In the emergency department he was noted to have chest x-ray demonstrating some cardiomegaly with pulmonary edema and pleural effusions and BNP was elevated in the 400 range.  No other significant lab abnormalities otherwise noted aside from his creatinine level suggestive of chronic kidney disease stage III.  EKG with sinus rhythm at 81 bpm and no other significant changes compared to prior.  His high-sensitivity troponin was noted to be elevated at 26 and his repeat troponin showed increased at 32, but subsequent repeat is at 34 and appears to be flat trending.  He denies any further chest pain and states that he is ready to go home.  He did receive Nitropaste as well as 80 of torsemide with significant improvement in his symptoms and he can now ambulate and is no longer hypoxemic.  He appears to have diuresed over 1 L fluid and appears to be stable  for discharge.   Review of Systems:  ROS All others reviewed and otherwise negative except as per above.   Past Medical History: Past Medical History:  Diagnosis Date  . Acid reflux   . Back pain    f4 and f5  . CAD (coronary artery disease)   . CHF (congestive heart failure) ([place])    last echo was in March 2016  . Diabetes mellitus 2005  . Diverticulosis   . H/O chest pain 2005   ECHO  . HTN (hypertension)   . Hyperlipidemia   . Nonischemic cardiomyopathy ([place])   . Onychomycosis   . Polysubstance abuse [facility])     Past Surgical History: Past Surgical History:  Procedure Laterality Date  . APPENDECTOMY    . CARDIAC CATHETERIZATION  2005   4 frech cath  . COLONOSCOPY N/A 08/01/2013   Procedure: COLONOSCOPY;  Surgeon: Rogene Houston, MD;  [facility];  Service: Endoscopy;  Laterality: N/A;  rescheduled to [place] notified pt  . CORONARY STENT INTERVENTION N/A 03/19/2018   Procedure: CORONARY STENT INTERVENTION;  Surgeon: Leonie Man, MD;  Location: [place] [facility];  Service: Cardiovascular;  Laterality: N/A;  . ESOPHAGOGASTRODUODENOSCOPY N/A 04/23/2015   Procedure: ESOPHAGOGASTRODUODENOSCOPY (EGD);  Surgeon: Rogene Houston, MD;  Location: [facility];  Service: Endoscopy;  Laterality: N/A;  1:25  . RIGHT/LEFT HEART CATH AND CORONARY ANGIOGRAPHY  N/A 06/03/2016   Procedure: Right/Left Heart Cath and Coronary Angiography;  Surgeon: Belva Crome, MD;  Location: [place] [facility];  Service: Cardiovascular;  Laterality: N/A;  . RIGHT/LEFT HEART CATH AND CORONARY ANGIOGRAPHY N/A 03/19/2018   Procedure: RIGHT/LEFT HEART CATH AND CORONARY ANGIOGRAPHY;  Surgeon: Jolaine Artist, MD;  Location: [place] [facility];  Service: Cardiovascular;  Laterality: N/A;     Allergies:   Allergies  Allergen Reactions  . Sulfa Antibiotics Shortness Of Breath  . Penicillins Rash and Other (See Comments)    Has patient had a PCN reaction causing immediate rash,  facial/tongue/throat swelling, SOB or lightheadedness with hypotension: No Has patient had a PCN reaction causing severe rash involving mucus membranes or skin necrosis: No Has patient had a PCN reaction that required hospitalization No Has patient had a PCN reaction occurring within the last 10 years: No If all of the above answers are "NO", then may proceed with Cephalosporin use.      Social History:  reports that he has never smoked. His smokeless tobacco use includes snuff. He reports current alcohol use of about 1.0 standard drinks of alcohol per week. He reports current drug use. Drug: Marijuana.   Family History: Family History  Problem Relation Age of Onset  . Hypertension Father   . Diabetes Father   . Cancer Mother   . Alcohol abuse Brother   . Colon cancer Neg Hx     Physical Exam: Vitals:   08/26/18 0651 08/26/18 0700 08/26/18 0730 08/26/18 0800  BP: 140/69 131/64 (!) 177/92 (!) 178/97  Pulse: 78 75 75 77  Resp: 20   16  Temp:      SpO2: 95% (!) 88% 92% 94%  Weight:      Height:        Constitutional: Alert and awake, oriented x3, not in any acute distress. Eyes: PERLA, EOMI, irises appear normal, anicteric sclera,  ENMT: external ears and nose appear normal, normal hearing            Lips appears normal, oropharynx mucosa, tongue, posterior pharynx appear normal  Neck: neck appears normal, no masses, normal ROM, no thyromegaly, no JVD  CVS: S1-S2 clear, no murmur rubs or gallops, minimal LE edema, normal pedal pulses  Respiratory:  clear to auscultation bilaterally, no wheezing, rales or rhonchi. Respiratory effort normal. No accessory muscle use.  Abdomen: soft nontender, nondistended, normal bowel sounds, no hepatosplenomegaly, no hernias  Musculoskeletal: : no cyanosis, clubbing or edema noted bilaterally                       Neuro: Cranial nerves II-XII intact, strength, sensation, reflexes Psych: judgement and insight appear normal, stable mood and  affect, mental status Skin: no rashes or lesions or ulcers, no induration or nodules    Data reviewed:  I have personally reviewed following labs and imaging studies Labs:  CBC: Recent Labs  Lab 08/26/18 0110  WBC 11.1*  NEUTROABS 8.8*  HGB 10.2*  HCT 31.5*  MCV 85.6  PLT 532    Basic Metabolic Panel: Recent Labs  Lab 08/26/18 0110  NA 141  K 4.1  CL 109  CO2 22  GLUCOSE 170*  BUN 62*  CREATININE 1.91*  CALCIUM 8.4*   GFR Estimated Creatinine Clearance: 46.9 mL/min (A) (by C-G formula based on SCr of 1.91 mg/dL (H)). Liver Function Tests: Recent Labs  Lab 08/26/18 0110  AST 30  ALT 47*  ALKPHOS 79  BILITOT 0.6  PROT 5.9*  ALBUMIN 3.0*   No results for input(s): LIPASE, AMYLASE in the last 168 hours. No results for input(s): AMMONIA in the last 168 hours. Coagulation profile No results for input(s): INR, PROTIME in the last 168 hours.  Cardiac Enzymes: No results for input(s): CKTOTAL, CKMB, CKMBINDEX, TROPONINI in the last 168 hours. BNP: Invalid input(s): POCBNP CBG: Recent Labs  Lab 08/25/18 2343  GLUCAP 168*   D-Dimer No results for input(s): DDIMER in the last 72 hours. Hgb A1c No results for input(s): HGBA1C in the last 72 hours. Lipid Profile No results for input(s): CHOL, HDL, LDLCALC, TRIG, CHOLHDL, LDLDIRECT in the last 72 hours. Thyroid function studies No results for input(s): TSH, T4TOTAL, T3FREE, THYROIDAB in the last 72 hours.  Invalid input(s): FREET3 Anemia work up No results for input(s): VITAMINB12, FOLATE, FERRITIN, TIBC, IRON, RETICCTPCT in the last 72 hours. Urinalysis    Component Value Date/Time   COLORURINE STRAW (A) 04/18/2018 1752   APPEARANCEUR CLEAR 04/18/2018 1752   LABSPEC 1.008 04/18/2018 1752   PHURINE 5.0 04/18/2018 1752   GLUCOSEU >=500 (A) 04/18/2018 1752   HGBUR NEGATIVE 04/18/2018 1752   BILIRUBINUR NEGATIVE 04/18/2018 1752   BILIRUBINUR neg 11/07/2014 [place] 04/18/2018 1752    PROTEINUR 30 (A) 04/18/2018 1752   UROBILINOGEN negative 11/07/2014 1154   UROBILINOGEN 0.2 05/07/2013 0851   NITRITE NEGATIVE 04/18/2018 1752   LEUKOCYTESUR NEGATIVE 04/18/2018 1752     Microbiology No results found for this or any previous visit (from the past 240 hour(s)).     Inpatient Medications:   Scheduled Meds: . aflibercept  2 mg Intravitreal   . torsemide  80 mg Oral Daily   Continuous Infusions:   Radiological Exams on Admission: Dg Chest 2 View  Result Date: 08/26/2018 CLINICAL DATA:  Shortness of breath. Bilateral lower extremity edema. EXAM: CHEST - 2 VIEW COMPARISON:  Radiograph 01/21/2018 FINDINGS: Cardiomegaly. Vascular congestion with perivascular haziness suggesting pulmonary edema. Small right and trace left pleural effusion with fluid in the interlobar fissures. No pneumothorax or confluent airspace disease. IMPRESSION: Findings consistent with CHF. Cardiomegaly with mild pulmonary edema and small pleural effusions. Electronically Signed   By: Keith Rake M.D.   On: 08/26/2018 00:25    Impression/Recommendations Active Problems:   * No active hospital problems. *  Acute on chronic combined CHF exacerbation secondary to dietary and medication noncompliance -Patient noted to have salty foods such as sausage recently and had not taken his torsemide in the last 1 or 2 days -Advised strict compliance with his diet as well as fluid intake and medications -He normally takes torsemide 60 mg daily and will recommend 80 mg tomorrow and back to his usual schedule starting 7/7 -Monitor daily weights closely at home and call PCP office should there be a greater than 3 pound weight increase in 24 hours or 5 pounds over a week -Return to ED for any worsening shortness of breath or chest pain. -Patient has improved with Nitropaste as well as torsemide 80 mg during the stay. -No further hypoxemia noted with ambulation. -Encourage close follow-up with PCP in the next 3  to 5 days  Elevated troponin levels secondary to above -Flat trend noted with no active chest pain -EKG with no changes suggestive of ACS noted  CKD stage III -Creatinine level 1.9 and will need further follow-up outpatient   Thank you for this consultation.    Time Spent: 30 minutes.  Darthula Desa D Marguerite Olea.D.  [facility] 08/26/2018, 9:14 AM

## 2018-08-26 NOTE — Progress Notes (Signed)
Xcover 63 yo male with hypertension, hyperlipidemia, Dm2, CAD, CHF (EF 30-35%), apparently presents with c/o dyspnea and lower ext edema and has CHF on CXR

## 2018-08-26 NOTE — ED Notes (Signed)
Walked by patient's room, patient sitting on stool in room with all leads pulled off. Upon entering room to assist patient, patients states that he had to urinate so he urinated in the trash can. Urine was all over floor and patient.

## 2018-08-26 NOTE — ED Provider Notes (Signed)
[facility] EMERGENCY DEPARTMENT Provider Note   CSN: [id number] Arrival date & time: 08/25/18  2320  Time seen 12:05 AM.  History   Chief Complaint Chief Complaint  Patient presents with  . Shortness of Breath    HPI Brad Mendoza is a 63 y.o. male.     HPI she reports he has a history of congestive heart failure.  He states yesterday he ate some sausage.  He also forgot to take his torsemide yesterday.  He states he started getting swelling of his legs yesterday which did not resolve when he got up this morning  after laying down all night.  He also reports PND and DOE started yesterday.  He also states bending over makes him very short of breath.  He also states he had one episode of chest pain that he described as sharp in the left mid chest that only lasted a few seconds earlier today.  He states he feels like he has been wheezing but he denies cough or fever.  He states his abdomen feels tight and distended.  He states he has had this before with his congestive heart failure.  He denies any alcohol use.  He denies being on oxygen at home.  He states he did take 1 nitroglycerin today when he had that sharp chest pain but nothing else.  PCP [name], MD Cardiology Dr Sung Amabile  Past Medical History:  Diagnosis Date  . Acid reflux   . Back pain    f4 and f5  . CAD (coronary artery disease)   . CHF (congestive heart failure) ([place])    last echo was in March 2016  . Diabetes mellitus 2005  . Diverticulosis   . H/O chest pain 2005   ECHO  . HTN (hypertension)   . Hyperlipidemia   . Nonischemic cardiomyopathy ([place])   . Onychomycosis   . Polysubstance abuse [facility])     Patient Active Problem List   Diagnosis Date Noted  . Acute on chronic systolic (congestive) heart failure ([place]) 03/19/2018  . 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 Anginal pain ([place]) 06/01/2016  . CHF exacerbation ([place]) 11/17/2015  . Chronic combined systolic and diastolic heart failure ([place]) 11/16/2015  .  Type 2 diabetes mellitus with hyperosmolar nonketotic hyperglycemia ([place]) 05/08/2015  . Hyponatremia 05/08/2015  . Hypochloremia 05/08/2015  . Type 2 diabetes mellitus with diabetic polyneuropathy ([place]) 12/16/2013  . Coronary artery disease involving native coronary artery of native heart with angina pectoris ([place]) 09/18/2013  . Heme positive stool 06/12/2013  . Seasonal allergic rhinitis 07/11/2012  . Polysubstance abuse ([place]) 12/24/2010  . Onychomycosis   . Acid reflux   . Nonischemic cardiomyopathy ([place])   . Type 2 diabetes mellitus with complications ([place])   . Hyperlipemia 11/16/2009  . Obesity 11/16/2009  . Tobacco dependence 11/16/2009  . Essential hypertension 11/16/2009    Past Surgical History:  Procedure Laterality Date  . APPENDECTOMY    . CARDIAC CATHETERIZATION  2005   4 frech cath  . COLONOSCOPY N/A 08/01/2013   Procedure: COLONOSCOPY;  Surgeon: Rogene Houston, MD;  Location[facility];  Service: Endoscopy;  Laterality: N/A;  rescheduled to [place] notified pt  . CORONARY STENT INTERVENTION N/A 03/19/2018   Procedure: CORONARY STENT INTERVENTION;  Surgeon: Leonie Man, MD;  Location: [place] CV LAB;  Service: Cardiovascular;  Laterality: N/A;  . ESOPHAGOGASTRODUODENOSCOPY N/A 04/23/2015   Procedure: ESOPHAGOGASTRODUODENOSCOPY (EGD);  Surgeon: Rogene Houston, MD;  Location: [facility];  Service:  Endoscopy;  Laterality: N/A;  1:25  . RIGHT/LEFT HEART CATH AND CORONARY ANGIOGRAPHY N/A 06/03/2016   Procedure: Right/Left Heart Cath and Coronary Angiography;  Surgeon: Belva Crome, MD;  Location: [place] CV LAB;  Service: Cardiovascular;  Laterality: N/A;  . RIGHT/LEFT HEART CATH AND CORONARY ANGIOGRAPHY N/A 03/19/2018   Procedure: RIGHT/LEFT HEART CATH AND CORONARY ANGIOGRAPHY;  Surgeon: Jolaine Artist, MD;  Location: [place] CV LAB;  Service: Cardiovascular;  Laterality: N/A;        Home Medications    Prior to Admission medications    Medication Sig Start Date End Date Taking? Authorizing Provider  aspirin EC 81 MG tablet Take 81 mg by mouth daily.    [provider]  Capsaicin (ICY HOT ARTHRITIS THERAPY EX) Apply 1 application topically 2 (two) times daily as needed (back pain).     [provider]  carvedilol (COREG) 6.25 MG tablet Take 1 tablet (6.25 mg total) by mouth 2 (two) times daily with a meal. 07/27/18   [name], MD  Cholecalciferol (VITAMIN D3) 25 MCG (1000 UT) CAPS Take 2,000 Units by mouth daily.    [provider]  empagliflozin (JARDIANCE) 10 MG TABS tablet Take 10 mg by mouth daily. 07/27/18   [name], MD  fluticasone Asencion Islam) 50 MCG/ACT nasal spray USE 2 SPRAYS IN EACH NOSTRIL DAILY 06/20/18   [name], MD  glucose blood (ONETOUCH VERIO) test strip USE FOUR TIMES DAILY AS NEEDED 07/27/18   [name], MD  hydrALAZINE (APRESOLINE) 100 MG tablet Take 1 tablet (100 mg total) by mouth 3 (three) times daily. 07/27/18   [name], MD  Insulin Glargine, 2 Unit Dial, (TOUJEO MAX SOLOSTAR) 300 UNIT/ML SOPN Inject 45 Units into the skin daily before breakfast AND 45 Units daily before supper. 07/27/18   [name], MD  isosorbide mononitrate (IMDUR) 120 MG 24 hr tablet Take 1 tablet (120 mg total) by mouth daily. 07/27/18   [name], MD  metFORMIN (GLUCOPHAGE-XR) 750 MG 24 hr tablet TAKE 1 TABLET EVERY MORNING WITH BREAKFAST 07/27/18   [name], MD  metolazone (ZAROXOLYN) 5 MG tablet Take 1 tablet (5 mg total) by mouth 2 (two) times a week. Take on Mondays and Fridays 07/30/18   [name], MD  Multiple Vitamins-Minerals (CENTRUM SILVER ADULT 50+ PO) Take 1 tablet by mouth daily.    [provider]  nabumetone (RELAFEN) 500 MG tablet TAKE TWO TABLETS BY MOUTH TWICE DAILY 07/23/18   [name], MD  nitroGLYCERIN (NITROSTAT) 0.4 MG SL tablet DISSOLVE 1 TAB UNDER TOUNGE FOR CHEST PAIN. MAY REPEAT EVERY 5 MINUTES FOR 3 DOSES. IF NO RELIEF CALL 911 OR GO TO  ER Patient taking differently: Place 0.4 mg under the tongue every 5 (five) minutes as needed for chest pain.  12/10/15   [name], MD  Omega-3 Fatty Acids (FISH OIL) 1000 MG CPDR Take 1,000 mg by mouth daily.     [provider]  pantoprazole (PROTONIX) 40 MG tablet Take 1 tablet (40 mg total) by mouth daily. 07/27/18   [name], MD  pregabalin (LYRICA) 225 MG capsule Take 1 capsule (225 mg total) by mouth at bedtime. 07/27/18   [name], MD  PROAIR HFA 108 [phone] Base) MCG/ACT inhaler USE 2 PUFFS EVERY 6 HOURS AS NEEDED 05/25/18   [name], MD  ranolazine (RANEXA) 500 MG 12 hr tablet TAKE ONE TABLET BY MOUTH TWICE DAILY 07/23/18   Bensimhon, Shaune Pascal, MD  rosuvastatin (CRESTOR)  10 MG tablet Take 1 tablet (10 mg total) by mouth daily. 07/27/18   [name], MD  terbinafine (LAMISIL) 1 % cream Apply 1 application topically 2 (two) times daily. Apply to both feet and between toes 04/10/18   [name], MD  torsemide (DEMADEX) 20 MG tablet Take 3 tablets (60 mg total) by mouth daily. Patient taking differently: Take 80 mg by mouth daily.  07/27/18   [name], MD    Family History Family History  Problem Relation Age of Onset  . Hypertension Father   . Diabetes Father   . Cancer Mother   . Alcohol abuse Brother   . Colon cancer Neg Hx     Social History Social History   Tobacco Use  . Smoking status: Never Smoker  . Smokeless tobacco: Current User    Types: Snuff  . Tobacco comment: dips snuff about 6 cans/week for 3 years  Substance Use Topics  . Alcohol use: Yes    Alcohol/week: 1.0 standard drinks    Types: 1 Cans of beer per week    Comment: occ - has cut down, now once every 2-3 months  . Drug use: Yes    Types: Marijuana    Comment: occasional use - smoking less  lives at home  Allergies   Sulfa antibiotics and Penicillins   Review of Systems Review of Systems  All other systems reviewed and are negative.    Physical Exam  Updated Vital Signs BP (!) 170/80 (BP Location: Left Arm)   Pulse 83   Temp 98.6 F (37 C)   Resp (!) 24   Ht 5\' 9"  (1.753 m)   Wt 100.7 kg   SpO2 94%   BMI 32.78 kg/m   Physical Exam Vitals signs and nursing note reviewed.  Constitutional:      Appearance: Normal appearance. He is obese.  HENT:     Head: Normocephalic and atraumatic.     Right Ear: External ear normal.     Left Ear: External ear normal.     Nose: Nose normal.     Mouth/Throat:     Mouth: Mucous membranes are dry.     Pharynx: Oropharynx is clear.  Eyes:     Extraocular Movements: Extraocular movements intact.     Conjunctiva/sclera: Conjunctivae normal.     Pupils: Pupils are equal, round, and reactive to light.     Comments: Small pupils bilaterally  Neck:     Musculoskeletal: Normal range of motion and neck supple.  Cardiovascular:     Rate and Rhythm: Normal rate and regular rhythm.     Pulses: Normal pulses.     Heart sounds: Normal heart sounds.  Pulmonary:     Comments: Patient has diminished breath sounds diffusely. Abdominal:     General: There is distension.     Palpations: Abdomen is soft.  Musculoskeletal:        General: Swelling present.     Comments: Patient has 1+ pitting edema bilaterally.  Skin:    General: Skin is warm and dry.     Findings: No rash.  Neurological:     General: No focal deficit present.     Mental Status: He is alert and oriented to person, place, and time. Mental status is at baseline.     Cranial Nerves: No cranial nerve deficit.  Psychiatric:        Mood and Affect: Mood normal.        Behavior: Behavior normal.  Thought Content: Thought content normal.      ED Treatments / Results  Labs (all labs ordered are listed, but only abnormal results are displayed) Results for orders placed or performed during the hospital encounter of 08/25/18  Comprehensive metabolic panel  Result Value Ref Range   Sodium 141 135 - 145 mmol/L   Potassium 4.1 3.5  - 5.1 mmol/L   Chloride 109 98 - 111 mmol/L   CO2 22 22 - 32 mmol/L   Glucose, Bld 170 (H) 70 - 99 mg/dL   BUN 62 (H) 8 - 23 mg/dL   Creatinine, Ser 1.91 (H) 0.61 - 1.24 mg/dL   Calcium 8.4 (L) 8.9 - 10.3 mg/dL   Total Protein 5.9 (L) 6.5 - 8.1 g/dL   Albumin 3.0 (L) 3.5 - 5.0 g/dL   AST 30 15 - 41 U/L   ALT 47 (H) 0 - 44 U/L   Alkaline Phosphatase 79 38 - 126 U/L   Total Bilirubin 0.6 0.3 - 1.2 mg/dL   GFR calc non Af Amer 37 (L) >60 mL/min   GFR calc Af Amer 43 (L) >60 mL/min   Anion gap 10 5 - 15  Brain natriuretic peptide  Result Value Ref Range   B Natriuretic Peptide 494.0 (H) 0.0 - 100.0 pg/mL  CBC with Differential  Result Value Ref Range   WBC 11.1 (H) 4.0 - 10.5 K/uL   RBC 3.68 (L) 4.22 - 5.81 MIL/uL   Hemoglobin 10.2 (L) 13.0 - 17.0 g/dL   HCT 31.5 (L) 39.0 - 52.0 %   MCV 85.6 80.0 - 100.0 fL   MCH 27.7 26.0 - 34.0 pg   MCHC 32.4 30.0 - 36.0 g/dL   RDW 14.5 11.5 - 15.5 %   Platelets 212 150 - 400 K/uL   nRBC 0.0 0.0 - 0.2 %   Neutrophils Relative % 78 %   Neutro Abs 8.8 (H) 1.7 - 7.7 K/uL   Lymphocytes Relative 8 %   Lymphs Abs 0.8 0.7 - 4.0 K/uL   Monocytes Relative 11 %   Monocytes Absolute 1.2 (H) 0.1 - 1.0 K/uL   Eosinophils Relative 1 %   Eosinophils Absolute 0.1 0.0 - 0.5 K/uL   Basophils Relative 1 %   Basophils Absolute 0.1 0.0 - 0.1 K/uL   Immature Granulocytes 1 %   Abs Immature Granulocytes 0.08 (H) 0.00 - 0.07 K/uL  Troponin I (High Sensitivity)  Result Value Ref Range   Troponin I (High Sensitivity) 26.00 (H) <18 ng/L  Troponin I (High Sensitivity)  Result Value Ref Range   Troponin I (High Sensitivity) 32.00 (H) <18 ng/L  CBG monitoring, ED  Result Value Ref Range   Glucose-Capillary 168 (H) 70 - 99 mg/dL   Comment 1 Notify RN    Comment 2 Document in Chart    Laboratory interpretation all normal except positive troponins with a rise of 6, renal insufficiency which is stable, stable anemia    EKG EKG Interpretation  Date/Time:   Saturday August 25 2018 23:30:14 EDT Ventricular Rate:  81 PR Interval:    QRS Duration: 87 QT Interval:  460 QTC Calculation: 534 R Axis:   38 Text Interpretation:  Age not entered, assumed to be  63 years old for purpose of ECG interpretation Sinus rhythm Borderline T abnormalities, inferior leads Prolonged QT interval No significant change since last tracing 22 Mar 2018 Confirmed by Rolland Porter [phone]) on 08/25/2018 11:44:40 PM   Radiology Dg Chest 2 View  Result Date:  08/26/2018 CLINICAL DATA:  Shortness of breath. Bilateral lower extremity edema. EXAM: CHEST - 2 VIEW COMPARISON:  Radiograph 01/21/2018 FINDINGS: Cardiomegaly. Vascular congestion with perivascular haziness suggesting pulmonary edema. Small right and trace left pleural effusion with fluid in the interlobar fissures. No pneumothorax or confluent airspace disease. IMPRESSION: Findings consistent with CHF. Cardiomegaly with mild pulmonary edema and small pleural effusions. Electronically Signed   By: Keith Rake M.D.   On: 08/26/2018 00:25    Procedures Procedures (including critical care time)  Medications Ordered in ED Medications  torsemide (DEMADEX) tablet 80 mg (80 mg Oral Given 08/26/18 0049)  nitroGLYCERIN (NITROGLYN) 2 % ointment 1 inch (1 inch Topical Given 08/26/18 0045)  torsemide (DEMADEX) 20 MG tablet (has no administration in time range)     Initial Impression / Assessment and Plan / ED Course  I have reviewed the triage vital signs and the nursing notes.  Pertinent labs & imaging results that were available during my care of the patient were reviewed by me and considered in my medical decision making (see chart for details).       When I review patient's most recent labs from February his creatinine was 2.8.  He has had a chronic renal insufficiency.  I do not feel like Lasix would be effective.  Unfortunately I am unable to write for IV Bumex due to nationwide shortage.  He usually takes torsemide 60 mg  orally.  I gave him 80 orally.  He also was given nitroglycerin paste.  His blood pressure was elevated at 182/92.  Recheck at 4:45 AM patient states he feels better.  He was ambulated and his pulse ox remained 94 to 97%.  He did have some shortness of breath but he felt like he was ready to go home.  He did have a urinal at the bedside look like he had about 5 or 600 cc of urine in it.  I rechecked patient at 5:20 AM he appears to be short of breath, please note he is laying flat on the stretcher.  I talked to him about his delta troponins and he should stay in the hospital and he is reluctantly agreeable.  5:46 AM Dr. Maudie Mercury, hospitalist will admit.  Final Clinical Impressions(s) / ED Diagnoses   Final diagnoses:  Acute on chronic congestive heart failure, unspecified heart failure type [facility])    Plan admission  Rolland Porter, MD, Barbette Or, MD 08/26/18 [phone]

## 2018-08-26 NOTE — ED Notes (Signed)
Spoke with Dr. Brigitte Pulse states he spoke with EDP and the patient was to be discharged. RN notified.

## 2018-08-26 NOTE — ED Notes (Signed)
Pt requesting to leave AMA. Pt states that he does not want to be admitted and does not want to wait anymore to see hospitalist.  Secretary to notify Dr. Brigitte Pulse.

## 2018-08-26 NOTE — ED Notes (Signed)
Patient very anxious at this time. Stating that he wants to go home because its taking to long to get him admitted. Patient taking cardiac leads off. Put leads back on and reminded patient that he needs to leave them on so he can be monitored. Stating that he will go AMA soon.

## 2018-08-26 NOTE — ED Notes (Addendum)
Patient ambulated in hallway on room air. 02 sat range between 94 to 97 percent. Patient stated that he felt a little short of breath. States that he is ready to go home.

## 2018-08-27 ENCOUNTER — Ambulatory Visit (INDEPENDENT_AMBULATORY_CARE_PROVIDER_SITE_OTHER): Payer: Medicare Other | Admitting: Family Medicine

## 2018-08-27 ENCOUNTER — Encounter: Payer: Self-pay | Admitting: Family Medicine

## 2018-08-27 DIAGNOSIS — I5043 Acute on chronic combined systolic (congestive) and diastolic (congestive) heart failure: Secondary | ICD-10-CM | POA: Diagnosis not present

## 2018-08-27 MED ORDER — CETIRIZINE HCL 10 MG PO TABS: 10.0000 mg | ORAL_TABLET | Freq: Every day | ORAL | 3 refills | Status: DC

## 2018-08-27 NOTE — Progress Notes (Signed)
Subjective:    Patient ID: Brad Mendoza, male    DOB: [birth year], 63 y.o.   MRN: [id number]   HPI: Brad Mendoza is a 63 y.o. male presenting for dyspnea and swelling. Taking torsemide 80 mg starting today. (4X 20 mg). Went to E.D. yesterday. That report was reviewed by me for todays visit. E.D> recommended admission, pt. Declined because he had back pain worsening. DC recommended bby inpatient consultant. He noted that creatinine was lower than usual. Dx was CHF. Pt. Admits eating salty food at a cook out. Told E.D. MD he missed some of his torsemide.  Rash on legs and arms. Itching a lot. Onset 2 months ago. Intermittent. Worse gettingout of the shower. Gold bond helps for a little while. Changed soap to [place] spring.    Depression screen [facility] 2/9 07/27/2018 04/10/2018 01/25/2018 12/14/2016 12/02/2016  Decreased Interest 0 0 0 1 0  Down, Depressed, Hopeless 0 0 0 0 0  PHQ - 2 Score 0 0 0 1 0  Some recent data might be hidden     Relevant past medical, surgical, family and social history reviewed and updated as indicated.  Interim medical history since our last visit reviewed. Allergies and medications reviewed and updated.  ROS:  Review of Systems  Constitutional: Negative.   HENT: Negative.   Eyes: Negative for visual disturbance.  Respiratory: Positive for shortness of breath. Negative for cough.   Cardiovascular: Positive for leg swelling. Negative for chest pain.  Gastrointestinal: Negative for abdominal pain, diarrhea, nausea and vomiting.  Genitourinary: Negative for difficulty urinating.  Musculoskeletal: Negative for arthralgias and myalgias.  Skin: Negative for rash.  Neurological: Negative for headaches.  Psychiatric/Behavioral: Negative for sleep disturbance.     Social History   Tobacco Use  Smoking Status Never Smoker  Smokeless Tobacco Current User  . Types: Snuff  Tobacco Comment   dips snuff about 6 cans/week for 3 years       Objective:     Wt  Readings from Last 3 Encounters:  08/25/18 222 lb (100.7 kg)  08/20/18 222 lb (100.7 kg)  07/27/18 210 lb (95.3 kg)     Exam deferred. Pt. Harboring due to COVID 19. Phone visit performed.   Assessment & Plan:   1. Acute on chronic combined systolic and diastolic congestive heart failure ([place])     Meds ordered this encounter  Medications  . cetirizine (ZYRTEC) 10 MG tablet    Sig: Take 1 tablet (10 mg total) by mouth daily. For itching    Dispense:  90 tablet    Refill:  3    No orders of the defined types were placed in this encounter.     Diagnoses and all orders for this visit:  Acute on chronic combined systolic and diastolic congestive heart failure ([place])  Other orders -     cetirizine (ZYRTEC) 10 MG tablet; Take 1 tablet (10 mg total) by mouth daily. For itching    Virtual Visit via telephone Note  I discussed the limitations, risks, security and privacy concerns of performing an evaluation and management service by telephone and the availability of in person appointments. The patient was identified with two identifiers. Pt.expressed understanding and agreed to proceed. Pt. Is at home. Dr. Livia Snellen is in his office.  Follow Up Instructions:   I discussed the assessment and treatment plan with the patient. The patient was provided an opportunity to ask questions and all were answered. The patient agreed with the plan  and demonstrated an understanding of the instructions.   The patient was advised to call back or seek an in-person evaluation if the symptoms worsen or if the condition fails to improve as anticipated.   Total minutes including chart review and phone contact time: 20   Follow up plan: Return in about 1 week (around 09/03/2018).  Brad Fraise, MD [place]

## 2018-08-29 DIAGNOSIS — I5023 Acute on chronic systolic (congestive) heart failure: Secondary | ICD-10-CM | POA: Diagnosis not present

## 2018-08-29 DIAGNOSIS — Z794 Long term (current) use of insulin: Secondary | ICD-10-CM | POA: Diagnosis not present

## 2018-08-29 DIAGNOSIS — E1122 Type 2 diabetes mellitus with diabetic chronic kidney disease: Secondary | ICD-10-CM | POA: Diagnosis not present

## 2018-08-29 DIAGNOSIS — N183 Chronic kidney disease, stage 3 (moderate): Secondary | ICD-10-CM | POA: Diagnosis not present

## 2018-08-29 DIAGNOSIS — Z7982 Long term (current) use of aspirin: Secondary | ICD-10-CM | POA: Diagnosis not present

## 2018-08-29 DIAGNOSIS — Z9181 History of falling: Secondary | ICD-10-CM | POA: Diagnosis not present

## 2018-08-29 DIAGNOSIS — I130 Hypertensive heart and chronic kidney disease with heart failure and stage 1 through stage 4 chronic kidney disease, or unspecified chronic kidney disease: Secondary | ICD-10-CM | POA: Diagnosis not present

## 2018-08-29 DIAGNOSIS — K219 Gastro-esophageal reflux disease without esophagitis: Secondary | ICD-10-CM | POA: Diagnosis not present

## 2018-08-29 DIAGNOSIS — I2510 Atherosclerotic heart disease of native coronary artery without angina pectoris: Secondary | ICD-10-CM | POA: Diagnosis not present

## 2018-08-29 DIAGNOSIS — E785 Hyperlipidemia, unspecified: Secondary | ICD-10-CM | POA: Diagnosis not present

## 2018-08-29 DIAGNOSIS — I255 Ischemic cardiomyopathy: Secondary | ICD-10-CM | POA: Diagnosis not present

## 2018-08-29 DIAGNOSIS — I272 Pulmonary hypertension, unspecified: Secondary | ICD-10-CM | POA: Diagnosis not present

## 2018-08-30 ENCOUNTER — Other Ambulatory Visit (HOSPITAL_COMMUNITY): Payer: Self-pay | Admitting: Internal Medicine

## 2018-09-04 ENCOUNTER — Encounter: Payer: Self-pay | Admitting: Family Medicine

## 2018-09-04 ENCOUNTER — Other Ambulatory Visit: Payer: Self-pay

## 2018-09-04 ENCOUNTER — Ambulatory Visit (INDEPENDENT_AMBULATORY_CARE_PROVIDER_SITE_OTHER): Payer: Medicare Other | Admitting: Family Medicine

## 2018-09-04 DIAGNOSIS — I5023 Acute on chronic systolic (congestive) heart failure: Secondary | ICD-10-CM | POA: Diagnosis not present

## 2018-09-04 MED ORDER — CETIRIZINE HCL 10 MG PO TABS: 10.0000 mg | ORAL_TABLET | Freq: Every day | ORAL | 3 refills | Status: DC

## 2018-09-04 NOTE — Progress Notes (Signed)
    Subjective:    Patient ID: Brad Mendoza, male    DOB: 1955/03/23, 63 y.o.   MRN: [id number]   HPI: Brad Mendoza is a 63 y.o. male presenting for recheck of edema. Thinks it was because he ate too much watermelon. Slacked off  And feeling better. Took an extra torsemide for several days to help as well. HE had missed doses of that for a few days before he became ill. 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   Depression screen [facility] 2/9 07/27/2018 04/10/2018 01/25/2018 12/14/2016 12/02/2016  Decreased Interest 0 0 0 1 0  Down, Depressed, Hopeless 0 0 0 0 0  PHQ - 2 Score 0 0 0 1 0  Some recent data might be hidden     Relevant past medical, surgical, family and social history reviewed and updated as indicated.  Interim medical history since our last visit reviewed. Allergies and medications reviewed and updated.  ROS:  Review of Systems  Constitutional: Negative for fever.  Respiratory: Negative for shortness of breath.   Cardiovascular: Negative for chest pain.  Musculoskeletal: Negative for arthralgias.  Skin: Negative for rash.     Social History   Tobacco Use  Smoking Status Never Smoker  Smokeless Tobacco Current User  . Types: Snuff  Tobacco Comment   dips snuff about 6 cans/week for 3 years       Objective:     Wt Readings from Last 3 Encounters:  08/25/18 222 lb (100.7 kg)  08/20/18 222 lb (100.7 kg)  07/27/18 210 lb (95.3 kg)     Exam deferred. Pt. Harboring due to COVID 19. Phone visit performed.   Assessment & Plan:   1. Acute on chronic systolic (congestive) heart failure (HCC)     Meds ordered this encounter  Medications  . cetirizine (ZYRTEC) 10 MG tablet    Sig: Take 1 tablet (10 mg total) by mouth daily. For itching    Dispense:  90 tablet    Refill:  3    No orders of the defined types were placed in this encounter.     Diagnoses and all orders for this visit:  Acute on chronic systolic (congestive) heart failure (HCC)  Other orders -     cetirizine (ZYRTEC) 10 MG tablet; Take 1 tablet (10 mg total) by mouth daily. For itching    Virtual Visit via telephone Note  I discussed the limitations, risks, security and privacy concerns of performing an evaluation and management service by telephone and the availability of in person appointments. The patient was identified with two identifiers. Pt.expressed understanding and agreed to proceed. Pt. Is at home. Dr. Livia Snellen is in his office.  Follow Up Instructions:   I discussed the assessment and treatment plan with the patient. The patient was provided an opportunity to ask questions and all were answered. The patient agreed with the plan and demonstrated an understanding of the instructions.   The patient was advised to call back or seek an in-person evaluation if the symptoms worsen or if the condition fails to improve as anticipated.   Total minutes including chart review and phone contact time: 15   Follow up plan: Return in about 3 months (around 12/05/2018).  [name], MD [place]

## 2018-09-05 DIAGNOSIS — K219 Gastro-esophageal reflux disease without esophagitis: Secondary | ICD-10-CM | POA: Diagnosis not present

## 2018-09-05 DIAGNOSIS — Z794 Long term (current) use of insulin: Secondary | ICD-10-CM | POA: Diagnosis not present

## 2018-09-05 DIAGNOSIS — I2510 Atherosclerotic heart disease of native coronary artery without angina pectoris: Secondary | ICD-10-CM | POA: Diagnosis not present

## 2018-09-05 DIAGNOSIS — I130 Hypertensive heart and chronic kidney disease with heart failure and stage 1 through stage 4 chronic kidney disease, or unspecified chronic kidney disease: Secondary | ICD-10-CM | POA: Diagnosis not present

## 2018-09-05 DIAGNOSIS — E1122 Type 2 diabetes mellitus with diabetic chronic kidney disease: Secondary | ICD-10-CM | POA: Diagnosis not present

## 2018-09-05 DIAGNOSIS — N183 Chronic kidney disease, stage 3 (moderate): Secondary | ICD-10-CM | POA: Diagnosis not present

## 2018-09-05 DIAGNOSIS — I255 Ischemic cardiomyopathy: Secondary | ICD-10-CM | POA: Diagnosis not present

## 2018-09-05 DIAGNOSIS — E785 Hyperlipidemia, unspecified: Secondary | ICD-10-CM | POA: Diagnosis not present

## 2018-09-05 DIAGNOSIS — Z7982 Long term (current) use of aspirin: Secondary | ICD-10-CM | POA: Diagnosis not present

## 2018-09-05 DIAGNOSIS — Z9181 History of falling: Secondary | ICD-10-CM | POA: Diagnosis not present

## 2018-09-05 DIAGNOSIS — I272 Pulmonary hypertension, unspecified: Secondary | ICD-10-CM | POA: Diagnosis not present

## 2018-09-05 DIAGNOSIS — I5023 Acute on chronic systolic (congestive) heart failure: Secondary | ICD-10-CM | POA: Diagnosis not present

## 2018-09-11 ENCOUNTER — Other Ambulatory Visit: Payer: Self-pay

## 2018-09-11 NOTE — Patient Outreach (Signed)
[place] [facility]) Care Management  09/11/2018   Brad Mendoza 05/22/1955 [id number]  Subjective: Successful outreach.  Two patient identifiers obtained. The patient states that he is doing well.  He denies any swelling or falls.  He states he has pain and soreness in both of his legs that he rates a 7/10.  He has medication that he takes for his pain. He states the he has shortness of breath with exertion. He states that he still has a Museum/gallery conservator come to the home.  He feels that this will be her last month.   He states that he went to the ER because he had a lot of swelling.  He went to a cook out and ate the food it had salt in it. He is monitoring his diet more.  He is taking his medications as prescribed.  Current Medications:  Current Outpatient Medications  Medication Sig Dispense Refill  . aspirin EC 81 MG tablet Take 81 mg by mouth daily.    [name] BRILINTA 90 MG TABS tablet Take 1 tablet by mouth 2 (two) times a day.    . Capsaicin (ICY HOT ARTHRITIS THERAPY EX) Apply 1 application topically 2 (two) times daily as needed (back pain).     . carvedilol (COREG) 6.25 MG tablet Take 1 tablet (6.25 mg total) by mouth 2 (two) times daily with a meal. 60 tablet 5  . cetirizine (ZYRTEC) 10 MG tablet Take 1 tablet (10 mg total) by mouth daily. For itching 90 tablet 3  . Cholecalciferol (VITAMIN D3) 25 MCG (1000 UT) CAPS Take 2,000 Units by mouth daily.    . empagliflozin (JARDIANCE) 10 MG TABS tablet Take 10 mg by mouth daily. 30 tablet 6  . fluticasone (FLONASE) 50 MCG/ACT nasal spray USE 2 SPRAYS IN EACH NOSTRIL DAILY 48 g 1  . glucose blood (ONETOUCH VERIO) test strip USE FOUR TIMES DAILY AS NEEDED 100 each 11  . hydrALAZINE (APRESOLINE) 100 MG tablet Take 1 tablet (100 mg total) by mouth 3 (three) times daily. 90 tablet 5  . Insulin Glargine, 2 Unit Dial, (TOUJEO MAX SOLOSTAR) 300 UNIT/ML SOPN Inject 45 Units into the skin daily before breakfast AND 45 Units daily before supper. 6 pen  11  . isosorbide mononitrate (IMDUR) 120 MG 24 hr tablet Take 1 tablet (120 mg total) by mouth daily. 30 tablet 5  . metFORMIN (GLUCOPHAGE-XR) 750 MG 24 hr tablet TAKE 1 TABLET EVERY MORNING WITH BREAKFAST 90 tablet 0  . metolazone (ZAROXOLYN) 5 MG tablet Take 1 tablet (5 mg total) by mouth 2 (two) times a week. Take on Mondays and Fridays 10 tablet 5  . Multiple Vitamins-Minerals (CENTRUM SILVER ADULT 50+ PO) Take 1 tablet by mouth daily.    . nabumetone (RELAFEN) 500 MG tablet TAKE TWO TABLETS BY MOUTH TWICE DAILY 360 tablet 1  . nitroGLYCERIN (NITROSTAT) 0.4 MG SL tablet DISSOLVE 1 TAB UNDER TOUNGE FOR CHEST PAIN. MAY REPEAT EVERY 5 MINUTES FOR 3 DOSES. IF NO RELIEF CALL 911 OR GO TO ER (Patient taking differently: Place 0.4 mg under the tongue every 5 (five) minutes as needed for chest pain. ) 25 tablet 2  . Omega-3 Fatty Acids (FISH OIL) 1000 MG CPDR Take 1,000 mg by mouth daily.     . pantoprazole (PROTONIX) 40 MG tablet Take 1 tablet (40 mg total) by mouth daily. 90 tablet 1  . pregabalin (LYRICA) 225 MG capsule Take 1 capsule (225 mg total) by mouth at bedtime. [place]  capsule 2  . PROAIR HFA 108 (90 Base) MCG/ACT inhaler USE 2 PUFFS EVERY 6 HOURS AS NEEDED 8.5 g 3  . ranolazine (RANEXA) 500 MG 12 hr tablet TAKE 1 TABLET BY MOUTH TWICE DAILY. 60 tablet 0  . rosuvastatin (CRESTOR) 10 MG tablet Take 1 tablet (10 mg total) by mouth daily. 90 tablet 1  . terbinafine (LAMISIL) 1 % cream Apply 1 application topically 2 (two) times daily. Apply to both feet and between toes 30 g 1  . torsemide (DEMADEX) 20 MG tablet Take 3 tablets (60 mg total) by mouth daily. (Patient taking differently: Take 80 mg by mouth daily. ) 90 tablet 5   Current Facility-Administered Medications  Medication Dose Route Frequency Provider Last Rate Last Dose  . aflibercept (EYLEA) SOLN 2 mg  2 mg Intravitreal  [name], MD   2 mg at 04/25/18 0029    Functional Status:  In your present state of health, do you have any  difficulty performing the following activities: 03/20/2018 03/20/2018  Hearing? - N  Vision? - N  Difficulty concentrating or making decisions? - N  Walking or climbing stairs? - N  Dressing or bathing? - N  Doing errands, shopping? N -  Some recent data might be hidden    Fall/Depression Screening: Fall Risk  09/11/2018 07/27/2018 06/11/2018  Falls in the past year? 0 0 0  Comment - - -  Number falls in past yr: - - -  Injury with Fall? - - -  Comment - - -  Risk for fall due to : - - -  Follow up - - -   PHQ 2/9 Scores 07/27/2018 04/10/2018 01/25/2018 12/14/2016 12/02/2016 09/26/2016 09/14/2016  PHQ - 2 Score 0 0 0 1 0 0 0    Assessment: Patient will continue to benefit from health coach outreach for disease management and support. [facility] CM Care Plan Problem One     Most Recent Value  [facility] Long Term Goal   In 90 days the patient will verbalize that he is staying out of the hospital for chf  [facility] Long Term Goal Start Date  09/11/18  Interventions for Problem One Long Term Goal  Encouraged the patient to continue to monitor his weight, blood sugars, diet and adherence of medications.  Advised the patient to stay out of the heat as much as possible and hydrate.     Plan: RN Health Coach will contact patient in the month of October and patient agrees to next outreach.   [name] RN, BSN, [place] Direct Dial:  [phone]  Fax: [phone]

## 2018-09-14 DIAGNOSIS — N183 Chronic kidney disease, stage 3 (moderate): Secondary | ICD-10-CM | POA: Diagnosis not present

## 2018-09-14 DIAGNOSIS — I255 Ischemic cardiomyopathy: Secondary | ICD-10-CM | POA: Diagnosis not present

## 2018-09-14 DIAGNOSIS — I130 Hypertensive heart and chronic kidney disease with heart failure and stage 1 through stage 4 chronic kidney disease, or unspecified chronic kidney disease: Secondary | ICD-10-CM | POA: Diagnosis not present

## 2018-09-14 DIAGNOSIS — E785 Hyperlipidemia, unspecified: Secondary | ICD-10-CM | POA: Diagnosis not present

## 2018-09-14 DIAGNOSIS — Z9181 History of falling: Secondary | ICD-10-CM | POA: Diagnosis not present

## 2018-09-14 DIAGNOSIS — I2510 Atherosclerotic heart disease of native coronary artery without angina pectoris: Secondary | ICD-10-CM | POA: Diagnosis not present

## 2018-09-14 DIAGNOSIS — Z7982 Long term (current) use of aspirin: Secondary | ICD-10-CM | POA: Diagnosis not present

## 2018-09-14 DIAGNOSIS — I272 Pulmonary hypertension, unspecified: Secondary | ICD-10-CM | POA: Diagnosis not present

## 2018-09-14 DIAGNOSIS — Z794 Long term (current) use of insulin: Secondary | ICD-10-CM | POA: Diagnosis not present

## 2018-09-14 DIAGNOSIS — K219 Gastro-esophageal reflux disease without esophagitis: Secondary | ICD-10-CM | POA: Diagnosis not present

## 2018-09-14 DIAGNOSIS — I5023 Acute on chronic systolic (congestive) heart failure: Secondary | ICD-10-CM | POA: Diagnosis not present

## 2018-09-14 DIAGNOSIS — E1122 Type 2 diabetes mellitus with diabetic chronic kidney disease: Secondary | ICD-10-CM | POA: Diagnosis not present

## 2018-09-17 ENCOUNTER — Other Ambulatory Visit (HOSPITAL_COMMUNITY): Payer: Self-pay | Admitting: Internal Medicine

## 2018-09-17 ENCOUNTER — Other Ambulatory Visit (HOSPITAL_COMMUNITY): Payer: Self-pay | Admitting: Adult Health

## 2018-09-17 ENCOUNTER — Other Ambulatory Visit: Payer: Self-pay | Admitting: Family Medicine

## 2018-09-17 DIAGNOSIS — J189 Pneumonia, unspecified organism: Secondary | ICD-10-CM

## 2018-09-17 DIAGNOSIS — K219 Gastro-esophageal reflux disease without esophagitis: Secondary | ICD-10-CM

## 2018-09-17 DIAGNOSIS — I10 Essential (primary) hypertension: Secondary | ICD-10-CM

## 2018-09-17 DIAGNOSIS — E118 Type 2 diabetes mellitus with unspecified complications: Secondary | ICD-10-CM

## 2018-09-17 DIAGNOSIS — E7800 Pure hypercholesterolemia, unspecified: Secondary | ICD-10-CM

## 2018-09-18 ENCOUNTER — Other Ambulatory Visit (HOSPITAL_COMMUNITY): Payer: Self-pay | Admitting: Adult Health

## 2018-09-18 ENCOUNTER — Other Ambulatory Visit: Payer: Self-pay | Admitting: Family Medicine

## 2018-09-18 DIAGNOSIS — E118 Type 2 diabetes mellitus with unspecified complications: Secondary | ICD-10-CM

## 2018-09-19 ENCOUNTER — Encounter: Payer: Self-pay | Admitting: *Deleted

## 2018-09-19 ENCOUNTER — Ambulatory Visit (INDEPENDENT_AMBULATORY_CARE_PROVIDER_SITE_OTHER): Payer: Medicare Other | Admitting: *Deleted

## 2018-09-19 ENCOUNTER — Encounter (HOSPITAL_COMMUNITY): Payer: Medicare Other | Admitting: Internal Medicine

## 2018-09-19 ENCOUNTER — Other Ambulatory Visit (HOSPITAL_COMMUNITY): Payer: Self-pay | Admitting: Internal Medicine

## 2018-09-19 ENCOUNTER — Other Ambulatory Visit (HOSPITAL_COMMUNITY): Payer: Medicare Other

## 2018-09-19 DIAGNOSIS — Z794 Long term (current) use of insulin: Secondary | ICD-10-CM | POA: Diagnosis not present

## 2018-09-19 DIAGNOSIS — I5023 Acute on chronic systolic (congestive) heart failure: Secondary | ICD-10-CM | POA: Diagnosis not present

## 2018-09-19 DIAGNOSIS — I2510 Atherosclerotic heart disease of native coronary artery without angina pectoris: Secondary | ICD-10-CM | POA: Diagnosis not present

## 2018-09-19 DIAGNOSIS — E785 Hyperlipidemia, unspecified: Secondary | ICD-10-CM | POA: Diagnosis not present

## 2018-09-19 DIAGNOSIS — I130 Hypertensive heart and chronic kidney disease with heart failure and stage 1 through stage 4 chronic kidney disease, or unspecified chronic kidney disease: Secondary | ICD-10-CM | POA: Diagnosis not present

## 2018-09-19 DIAGNOSIS — E1122 Type 2 diabetes mellitus with diabetic chronic kidney disease: Secondary | ICD-10-CM | POA: Diagnosis not present

## 2018-09-19 DIAGNOSIS — Z9181 History of falling: Secondary | ICD-10-CM | POA: Diagnosis not present

## 2018-09-19 DIAGNOSIS — Z0000 Encounter for general adult medical examination without abnormal findings: Secondary | ICD-10-CM | POA: Diagnosis not present

## 2018-09-19 DIAGNOSIS — I272 Pulmonary hypertension, unspecified: Secondary | ICD-10-CM | POA: Diagnosis not present

## 2018-09-19 DIAGNOSIS — N183 Chronic kidney disease, stage 3 (moderate): Secondary | ICD-10-CM | POA: Diagnosis not present

## 2018-09-19 DIAGNOSIS — Z7982 Long term (current) use of aspirin: Secondary | ICD-10-CM | POA: Diagnosis not present

## 2018-09-19 DIAGNOSIS — I255 Ischemic cardiomyopathy: Secondary | ICD-10-CM | POA: Diagnosis not present

## 2018-09-19 DIAGNOSIS — K219 Gastro-esophageal reflux disease without esophagitis: Secondary | ICD-10-CM | POA: Diagnosis not present

## 2018-09-19 NOTE — Patient Instructions (Signed)
Preventive Care 40-62 Years Old, Male Preventive care refers to lifestyle choices and visits with your health care provider that can promote health and wellness. This includes:  A yearly physical exam. This is also called an annual well check.  Regular dental and eye exams.  Immunizations.  Screening for certain conditions.  Healthy lifestyle choices, such as eating a healthy diet, getting regular exercise, not using drugs or products that contain nicotine and tobacco, and limiting alcohol use. What can I expect for my preventive care visit? Physical exam Your health care provider will check:  Height and weight. These may be used to calculate body mass index (BMI), which is a measurement that tells if you are at a healthy weight.  Heart rate and blood pressure.  Your skin for abnormal spots. Counseling Your health care provider may ask you questions about:  Alcohol, tobacco, and drug use.  Emotional well-being.  Home and relationship well-being.  Sexual activity.  Eating habits.  Work and work environment. What immunizations do I need?  Influenza (flu) vaccine  This is recommended every year. Tetanus, diphtheria, and pertussis (Tdap) vaccine  You may need a Td booster every 10 years. Varicella (chickenpox) vaccine  You may need this vaccine if you have not already been vaccinated. Zoster (shingles) vaccine  You may need this after age 60. Measles, mumps, and rubella (MMR) vaccine  You may need at least one dose of MMR if you were born in 1957 or later. You may also need a second dose. Pneumococcal conjugate (PCV13) vaccine  You may need this if you have certain conditions and were not previously vaccinated. Pneumococcal polysaccharide (PPSV23) vaccine  You may need one or two doses if you smoke cigarettes or if you have certain conditions. Meningococcal conjugate (MenACWY) vaccine  You may need this if you have certain conditions. Hepatitis A vaccine   You may need this if you have certain conditions or if you travel or work in places where you may be exposed to hepatitis A. Hepatitis B vaccine  You may need this if you have certain conditions or if you travel or work in places where you may be exposed to hepatitis B. Haemophilus influenzae type b (Hib) vaccine  You may need this if you have certain risk factors. Human papillomavirus (HPV) vaccine  If recommended by your health care provider, you may need three doses over 6 months. You may receive vaccines as individual doses or as more than one vaccine together in one shot (combination vaccines). Talk with your health care provider about the risks and benefits of combination vaccines. What tests do I need? Blood tests  Lipid and cholesterol levels. These may be checked every 5 years, or more frequently if you are over 50 years old.  Hepatitis C test.  Hepatitis B test. Screening  Lung cancer screening. You may have this screening every year starting at age 55 if you have a 30-pack-year history of smoking and currently smoke or have quit within the past 15 years.  Prostate cancer screening. Recommendations will vary depending on your family history and other risks.  Colorectal cancer screening. All adults should have this screening starting at age 50 and continuing until age 75. Your health care provider may recommend screening at age 45 if you are at increased risk. You will have tests every 1-10 years, depending on your results and the type of screening test.  Diabetes screening. This is done by checking your blood sugar (glucose) after you have not eaten   for a while (fasting). You may have this done every 1-3 years.  Sexually transmitted disease (STD) testing. Follow these instructions at home: Eating and drinking  Eat a diet that includes fresh fruits and vegetables, whole grains, lean protein, and low-fat dairy products.  Take vitamin and mineral supplements as recommended  by your health care provider.  Do not drink alcohol if your health care provider tells you not to drink.  If you drink alcohol: ? Limit how much you have to 0-2 drinks a day. ? Be aware of how much alcohol is in your drink. In the [place] one drink equals one 12 oz bottle of beer (355 mL), one 5 oz glass of wine (148 mL), or one 1 oz glass of hard liquor (44 mL). Lifestyle  Take daily care of your teeth and gums.  Stay active. Exercise for at least 30 minutes on 5 or more days each week.  Do not use any products that contain nicotine or tobacco, such as cigarettes, e-cigarettes, and chewing tobacco. If you need help quitting, ask your health care provider.  If you are sexually active, practice safe sex. Use a condom or other form of protection to prevent STIs (sexually transmitted infections).  Talk with your health care provider about taking a low-dose aspirin every day starting at age 33. What's next?  Go to your health care provider once a year for a well check visit.  Ask your health care provider how often you should have your eyes and teeth checked.  Stay up to date on all vaccines. This information is not intended to replace advice given to you by your health care provider. Make sure you discuss any questions you have with your health care provider. Document Released: 03/06/2015 Document Revised: 02/01/2018 Document Reviewed: 02/01/2018 Elsevier Patient Education  2020 [employer].

## 2018-09-19 NOTE — Progress Notes (Signed)
MEDICARE ANNUAL WELLNESS VISIT  09/19/2018  Telephone Visit Disclaimer This Medicare AWV was conducted by telephone due to national recommendations for restrictions regarding the COVID-19 Pandemic (e.g. social distancing).  I verified, using two identifiers, that I am speaking with Brad Mendoza or their authorized healthcare agent. I discussed the limitations, risks, security, and privacy concerns of performing an evaluation and management service by telephone and the potential availability of an in-person appointment in the future. The patient expressed understanding and agreed to proceed.   Subjective:  Brad Mendoza is a 63 y.o. male patient of Stacks, Cletus Gash, MD who had a Medicare Annual Wellness Visit today via telephone. Brad Mendoza is Disabled and lives alone. he has 2 children. he reports that he is socially active and does interact with friends/family regularly. he is minimally physically active and enjoys gardening and landscaping.  Patient Care Team: [name], MD as PCP - General (Family Medicine) Harlen Labs, MD as Referring Physician (Optometry) Herminio Commons, MD as Attending Physician (Cardiology) [name], RN as [facility] Management  Advanced Directives 09/19/2018 08/25/2018 04/18/2018 03/19/2018 03/08/2017 12/14/2016 09/26/2016  Does Patient Have a Medical Advance Directive? No No No No No No No  Would patient like information on creating a medical advance directive? No - Patient declined - No - Patient declined No - Patient declined No - Patient declined (No Data) No - Patient declined  Pre-existing out of facility DNR order (yellow form or pink MOST form) - - - - - - -    Hospital Utilization Over the Past 12 Months: # of hospitalizations or ER visits: 3 # of surgeries: 1  Review of Systems    Patient reports that his overall health is unchanged compared to last year.  Patient Reported Readings (BP, Pulse, CBG, Weight, etc) none   Review of Systems: No complaints  All other systems negative.  Pain Assessment Pain : No/denies pain     Current Medications & Allergies (verified) Allergies as of 09/19/2018      Reactions   Sulfa Antibiotics Shortness Of Breath   Penicillins Rash, Other (See Comments)   Has patient had a PCN reaction causing immediate rash, facial/tongue/throat swelling, SOB or lightheadedness with hypotension: No Has patient had a PCN reaction causing severe rash involving mucus membranes or skin necrosis: No Has patient had a PCN reaction that required hospitalization No Has patient had a PCN reaction occurring within the last 10 years: No If all of the above answers are "NO", then may proceed with Cephalosporin use.      Medication List       Accurate as of September 19, 2018 10:25 AM. If you have any questions, ask your nurse or doctor.        aspirin EC 81 MG tablet Take 81 mg by mouth daily.   Brilinta 90 MG Tabs tablet Generic drug: ticagrelor TAKE ONE TABLET BY MOUTH TWICE DAILY   carvedilol 6.25 MG tablet Commonly known as: COREG TAKE ONE TABLET TWICE A DAY WITH MEALS.   CENTRUM SILVER ADULT 50+ PO Take 1 tablet by mouth daily.   cetirizine 10 MG tablet Commonly known as: ZYRTEC Take 1 tablet (10 mg total) by mouth daily. For itching   empagliflozin 10 MG Tabs tablet Commonly known as: Jardiance Take 10 mg by mouth daily.   Fish Oil 1000 MG Cpdr Take 1,000 mg by mouth daily.   fluticasone 50 MCG/ACT nasal spray Commonly known as: FLONASE  USE 2 SPRAYS IN EACH NOSTRIL DAILY   glucose blood test strip Commonly known as: OneTouch Verio USE FOUR TIMES DAILY AS NEEDED   hydrALAZINE 100 MG tablet Commonly known as: APRESOLINE TAKE ONE (1) TABLET THREE (3) TIMES EACH DAY   ICY HOT ARTHRITIS THERAPY EX Apply 1 application topically 2 (two) times daily as needed (back pain).   isosorbide mononitrate 120 MG 24 hr tablet Commonly known as: IMDUR TAKE ONE (1) TABLET  EACH DAY   metFORMIN 750 MG 24 hr tablet Commonly known as: GLUCOPHAGE-XR TAKE 1 TABLET EVERY MORNING WITH BREAKFAST   metolazone 5 MG tablet Commonly known as: ZAROXOLYN TAKE 1 TABLET TWICE WEEKLY OM MONDAYS & FRIDAYS   nabumetone 500 MG tablet Commonly known as: RELAFEN TAKE TWO TABLETS BY MOUTH TWICE DAILY   nitroGLYCERIN 0.4 MG SL tablet Commonly known as: NITROSTAT DISSOLVE 1 TAB UNDER TOUNGE FOR CHEST PAIN. MAY REPEAT EVERY 5 MINUTES FOR 3 DOSES. IF NO RELIEF CALL 911 OR GO TO ER   pantoprazole 40 MG tablet Commonly known as: PROTONIX TAKE ONE (1) TABLET EACH DAY   pregabalin 225 MG capsule Commonly known as: LYRICA TAKE ONE CAPSULE BY MOUTH AT BEDTIME   ProAir HFA 108 (90 Base) MCG/ACT inhaler Generic drug: albuterol USE 2 PUFFS EVERY 6 HOURS AS NEEDED   ranolazine 500 MG 12 hr tablet Commonly known as: RANEXA TAKE ONE TABLET BY MOUTH TWICE DAILY   rosuvastatin 10 MG tablet Commonly known as: CRESTOR TAKE ONE (1) TABLET EACH DAY   terbinafine 1 % cream Commonly known as: LAMISIL Apply 1 application topically 2 (two) times daily. Apply to both feet and between toes   torsemide 20 MG tablet Commonly known as: DEMADEX TAKE 3 TABLETS DAILY   Toujeo Max SoloStar 300 UNIT/ML Sopn Generic drug: Insulin Glargine (2 Unit Dial) INJECT 45 UNITS SQ DAILY BEFORE BREAKFAST & 45 UNITS DAILY BEFORE SUPPER   Vitamin D3 25 MCG (1000 UT) Caps Take 2,000 Units by mouth daily.       History (reviewed): Past Medical History:  Diagnosis Date  . Acid reflux   . Back pain    f4 and f5  . CAD (coronary artery disease)   . CHF (congestive heart failure) ([place])    last echo was in March 2016  . Diabetes mellitus 2005  . Diverticulosis   . H/O chest pain 2005   ECHO  . HTN (hypertension)   . Hyperlipidemia   . Nonischemic cardiomyopathy ([place])   . Onychomycosis   . Polysubstance abuse [facility])    Past Surgical History:  Procedure Laterality Date  . APPENDECTOMY    .  CARDIAC CATHETERIZATION  2005   4 frech cath  . COLONOSCOPY N/A 08/01/2013   Procedure: COLONOSCOPY;  Surgeon: Rogene Houston, MD;  [facility];  Service: Endoscopy;  Laterality: N/A;  rescheduled to [place] notified pt  . CORONARY STENT INTERVENTION N/A 03/19/2018   Procedure: CORONARY STENT INTERVENTION;  Surgeon: Leonie Man, MD;  Location: [place] [facility];  Service: Cardiovascular;  Laterality: N/A;  . ESOPHAGOGASTRODUODENOSCOPY N/A 04/23/2015   Procedure: ESOPHAGOGASTRODUODENOSCOPY (EGD);  Surgeon: Rogene Houston, MD;  [facility];  Service: Endoscopy;  Laterality: N/A;  1:25  . RIGHT/LEFT HEART CATH AND CORONARY ANGIOGRAPHY N/A 06/03/2016   Procedure: Right/Left Heart Cath and Coronary Angiography;  Surgeon: Belva Crome, MD;  Location: [facility];  Service: Cardiovascular;  Laterality: N/A;  . RIGHT/LEFT HEART CATH AND  CORONARY ANGIOGRAPHY N/A 03/19/2018   Procedure: RIGHT/LEFT HEART CATH AND CORONARY ANGIOGRAPHY;  Surgeon: Jolaine Artist, MD;  Location: [facility];  Service: Cardiovascular;  Laterality: N/A;   Family History  Problem Relation Age of Onset  . Hypertension Father   . Diabetes Father   . Cancer Mother   . Alcohol abuse Brother   . Colon cancer Neg Hx    Social History   Socioeconomic History  . Marital status: Legally Separated    Spouse name: Not on file  . Number of children: 2  . Years of education: 4  . Highest education level: 4th grade  Occupational History  . Occupation: disabled  Social Needs  . Financial resource strain: Not hard at all  . Food insecurity    Worry: Never true    Inability: Never true  . Transportation needs    Medical: No    Non-medical: No  Tobacco Use  . Smoking status: Never Smoker  . Smokeless tobacco: Current User    Types: Snuff  . Tobacco comment: dips snuff about 6 cans/week for 3 years  Substance and Sexual Activity  . Alcohol use: Not Currently    Alcohol/week:  1.0 standard drinks    Types: 1 Cans of beer per week    Comment: occ - has cut down, now once every 2-3 months  . Drug use: Yes    Types: Marijuana    Comment: occasional use - smoking less  . Sexual activity: Yes    Birth control/protection: None  Lifestyle  . Physical activity    Days per week: 7 days    Minutes per session: 20 min  . Stress: Not at all  Relationships  . Social connections    Talks on phone: More than three times a week    Gets together: More than three times a week    Attends religious service: More than 4 times per year    Active member of club or organization: No    Attends meetings of clubs or organizations: Never    Relationship status: Patient refused  Other Topics Concern  . Not on file  Social History Narrative  . Not on file    Activities of Daily Living In your present state of health, do you have any difficulty performing the following activities: 09/19/2018 03/20/2018  Hearing? N -  Vision? N -  Comment pt had eye exam last year sometime but he can't remember what month -  Difficulty concentrating or making decisions? N -  Walking or climbing stairs? Y -  Comment due to knee pain -  Dressing or bathing? N -  Doing errands, shopping? N N  Preparing Food and eating ? N -  Using the Toilet? N -  In the past six months, have you accidently leaked urine? N -  Do you have problems with loss of bowel control? N -  Managing your Medications? Y -  Comment he gets his medications in a blister pack from pharmacy -  Managing your Finances? N -  Housekeeping or managing your Housekeeping? N -  Some recent data might be hidden    Patient Education/ Literacy How often do you need to have someone help you when you read instructions, pamphlets, or other written materials from your doctor or pharmacy?: 4 - Often What is the last grade level you completed in school?: 4th grade  Exercise Current Exercise Habits: Home exercise routine, Type of exercise:  walking, Time (Minutes): 20,  Frequency (Times/Week): 7, Weekly Exercise (Minutes/Week): 140, Intensity: Mild, Exercise limited by: orthopedic condition(s);cardiac condition(s)  Diet Patient reports consuming 3 meals a day and 1 snack(s) a day Patient reports that his primary diet is: Regular Patient reports that she does have regular access to food.   Depression Screen PHQ 2/9 Scores 09/19/2018 07/27/2018 04/10/2018 01/25/2018 12/14/2016 12/02/2016 09/26/2016  PHQ - 2 Score 0 0 0 0 1 0 0     Fall Risk Fall Risk  09/19/2018 09/11/2018 07/27/2018 06/11/2018 04/10/2018  Falls in the past year? 0 0 0 0 0  Comment - - - - -  Number falls in past yr: - - - - -  Injury with Fall? - - - - -  Comment - - - - -  Risk for fall due to : - - - - -  Follow up - - - - -     Objective:  Brad Mendoza seemed alert and oriented and he participated appropriately during our telephone visit.  Blood Pressure Weight BMI  BP Readings from Last 3 Encounters:  08/26/18 (!) 178/97  07/27/18 96/66  04/27/18 (!) 112/57   Wt Readings from Last 3 Encounters:  08/25/18 222 lb (100.7 kg)  08/20/18 222 lb (100.7 kg)  07/27/18 210 lb (95.3 kg)   BMI Readings from Last 1 Encounters:  08/25/18 32.78 kg/m    *Unable to obtain current vital signs, weight, and BMI due to telephone visit type  Hearing/Vision  . Flynn did not seem to have difficulty with hearing/understanding during the telephone conversation . Reports that he has not had a formal eye exam by an eye care professional within the past year . Reports that he has not had a formal hearing evaluation within the past year *Unable to fully assess hearing and vision during telephone visit type  Cognitive Function: 6CIT Screen 09/19/2018  What Year? 0 points  What month? 0 points  What time? 0 points  Count back from 20 0 points  Months in reverse 4 points  Repeat phrase 2 points  Total Score 6   (Normal:0-7, Significant for Dysfunction: >8)  Normal  Cognitive Function Screening: Yes   Immunization & Health Maintenance Record Immunization History  Administered Date(s) Administered  . Influenza,inj,Quad PF,6+ Mos 05/09/2015, 11/17/2015, 12/20/2016, 03/21/2018  . Pneumococcal Conjugate-13 05/14/2014  . Pneumococcal Polysaccharide-23 11/09/2012, 11/17/2015  . Tdap 07/26/2012    Health Maintenance  Topic Date Due  . OPHTHALMOLOGY EXAM  05/26/2018  . INFLUENZA VACCINE  09/22/2018  . HEMOGLOBIN A1C  01/26/2019  . FOOT EXAM  02/07/2019  . TETANUS/TDAP  07/27/2022  . COLONOSCOPY  08/02/2023  . PNEUMOCOCCAL POLYSACCHARIDE VACCINE AGE 42-64 HIGH RISK  Completed  . Hepatitis C Screening  Completed  . HIV Screening  Completed       Assessment  This is a routine wellness examination for [employer].  Health Maintenance: Due or Overdue Health Maintenance Due  Topic Date Due  . OPHTHALMOLOGY EXAM  05/26/2018    Brad Mendoza does not need a referral for Community Assistance: Care Management:   no Social Work:    no Prescription Assistance:  no Nutrition/Diabetes Education:  no   Plan:  Personalized Goals Goals Addressed            This Visit's Progress   . DIET - INCREASE WATER INTAKE       Try to drink 6-8 glasses of water daily.      Personalized Health Maintenance & Screening Recommendations  pt declines Shingles Vaccine  Lung Cancer Screening Recommended: no (Low Dose CT Chest recommended if Age 52-80 years, 30 pack-year currently smoking OR have quit w/in past 15 years) Hepatitis C Screening recommended: no HIV Screening recommended: no  Advanced Directives: Written information was not prepared per patient's request.  Referrals & Orders No orders of the defined types were placed in this encounter.   Follow-up Plan . Follow-up with [name], MD as planned   I have personally reviewed and noted the following in the patient's chart:   . Medical and social history . Use of alcohol, tobacco or  illicit drugs  . Current medications and supplements . Functional ability and status . Nutritional status . Physical activity . Advanced directives . List of other physicians . Hospitalizations, surgeries, and ER visits in previous 12 months . Vitals . Screenings to include cognitive, depression, and falls . Referrals and appointments  In addition, I have reviewed and discussed with Brad Mendoza certain preventive protocols, quality metrics, and best practice recommendations. A written personalized care plan for preventive services as well as general preventive health recommendations is available and can be mailed to the patient at his request.      Marylin Crosby, [name]  4/40/1027

## 2018-09-20 ENCOUNTER — Other Ambulatory Visit: Payer: Self-pay | Admitting: *Deleted

## 2018-09-20 DIAGNOSIS — I2510 Atherosclerotic heart disease of native coronary artery without angina pectoris: Secondary | ICD-10-CM

## 2018-09-20 DIAGNOSIS — I428 Other cardiomyopathies: Secondary | ICD-10-CM

## 2018-09-20 MED ORDER — ONETOUCH VERIO VI STRP: ORAL_STRIP | 11 refills | Status: DC

## 2018-09-20 MED ORDER — NITROGLYCERIN 0.4 MG SL SUBL: SUBLINGUAL_TABLET | SUBLINGUAL | 2 refills | Status: AC

## 2018-09-26 DIAGNOSIS — Z794 Long term (current) use of insulin: Secondary | ICD-10-CM | POA: Diagnosis not present

## 2018-09-26 DIAGNOSIS — I272 Pulmonary hypertension, unspecified: Secondary | ICD-10-CM | POA: Diagnosis not present

## 2018-09-26 DIAGNOSIS — E785 Hyperlipidemia, unspecified: Secondary | ICD-10-CM | POA: Diagnosis not present

## 2018-09-26 DIAGNOSIS — I5023 Acute on chronic systolic (congestive) heart failure: Secondary | ICD-10-CM | POA: Diagnosis not present

## 2018-09-26 DIAGNOSIS — I255 Ischemic cardiomyopathy: Secondary | ICD-10-CM | POA: Diagnosis not present

## 2018-09-26 DIAGNOSIS — N183 Chronic kidney disease, stage 3 (moderate): Secondary | ICD-10-CM | POA: Diagnosis not present

## 2018-09-26 DIAGNOSIS — I130 Hypertensive heart and chronic kidney disease with heart failure and stage 1 through stage 4 chronic kidney disease, or unspecified chronic kidney disease: Secondary | ICD-10-CM | POA: Diagnosis not present

## 2018-09-26 DIAGNOSIS — K219 Gastro-esophageal reflux disease without esophagitis: Secondary | ICD-10-CM | POA: Diagnosis not present

## 2018-09-26 DIAGNOSIS — Z9181 History of falling: Secondary | ICD-10-CM | POA: Diagnosis not present

## 2018-09-26 DIAGNOSIS — I2510 Atherosclerotic heart disease of native coronary artery without angina pectoris: Secondary | ICD-10-CM | POA: Diagnosis not present

## 2018-09-26 DIAGNOSIS — Z7982 Long term (current) use of aspirin: Secondary | ICD-10-CM | POA: Diagnosis not present

## 2018-09-26 DIAGNOSIS — E1122 Type 2 diabetes mellitus with diabetic chronic kidney disease: Secondary | ICD-10-CM | POA: Diagnosis not present

## 2018-10-03 ENCOUNTER — Encounter (INDEPENDENT_AMBULATORY_CARE_PROVIDER_SITE_OTHER): Payer: Medicare Other | Admitting: Ophthalmology

## 2018-10-03 ENCOUNTER — Other Ambulatory Visit (HOSPITAL_COMMUNITY): Payer: Self-pay | Admitting: Internal Medicine

## 2018-10-10 ENCOUNTER — Encounter (INDEPENDENT_AMBULATORY_CARE_PROVIDER_SITE_OTHER): Payer: Medicare Other | Admitting: Ophthalmology

## 2018-10-16 ENCOUNTER — Inpatient Hospital Stay (HOSPITAL_COMMUNITY): Admission: RE | Admit: 2018-10-16 | Payer: Medicare Other | Source: Ambulatory Visit | Admitting: Internal Medicine

## 2018-10-16 ENCOUNTER — Ambulatory Visit (HOSPITAL_COMMUNITY)
Admission: RE | Admit: 2018-10-16 | Discharge: 2018-10-16 | Disposition: A | Discharge status: Home / Self Care | Payer: Medicare Other | Source: Ambulatory Visit | Attending: Internal Medicine | Admitting: Internal Medicine

## 2018-10-24 ENCOUNTER — Telehealth: Payer: Self-pay | Admitting: *Deleted

## 2018-10-24 NOTE — Telephone Encounter (Signed)
Fax came in today for patient from [facility] [employer] statiing the patient returned his device with no data, unable to contact, phone number not in service. Joanna at [facility] [employer] will contact [place] at HF clinic.

## 2018-11-21 ENCOUNTER — Other Ambulatory Visit: Payer: Self-pay

## 2018-11-21 ENCOUNTER — Encounter: Payer: Self-pay | Admitting: Family Medicine

## 2018-11-21 ENCOUNTER — Ambulatory Visit (INDEPENDENT_AMBULATORY_CARE_PROVIDER_SITE_OTHER): Payer: Medicare Other | Admitting: Family Medicine

## 2018-11-21 DIAGNOSIS — K219 Gastro-esophageal reflux disease without esophagitis: Secondary | ICD-10-CM

## 2018-11-21 DIAGNOSIS — R1319 Other dysphagia: Secondary | ICD-10-CM

## 2018-11-21 DIAGNOSIS — E1100 Type 2 diabetes mellitus with hyperosmolarity without nonketotic hyperglycemic-hyperosmolar coma (NKHHC): Secondary | ICD-10-CM | POA: Diagnosis not present

## 2018-11-21 DIAGNOSIS — R1310 Dysphagia, unspecified: Secondary | ICD-10-CM

## 2018-11-21 MED ORDER — PANTOPRAZOLE SODIUM 40 MG PO TBEC: 40.0000 mg | DELAYED_RELEASE_TABLET | Freq: Two times a day (BID) | ORAL | 0 refills | Status: DC

## 2018-11-21 NOTE — Progress Notes (Signed)
Subjective:    Patient ID: Brad Mendoza, male    DOB: 10/18/1955, 63 y.o.   MRN: [id number]   HPI: Brad Mendoza is a 63 y.o. male presenting for choking sensation when lying down. Vomiting white foamy stuff. No hematemesis. No diarrhea. Green BMs, but eating a lot of greens.   Sour stomach. Real intense. Onset 2-3 weeks ago. No improvement. Can't lay down or rest. Having a lot of gas. Pain in the middle of his back at shoulder blades. Eating chicken, soups avoiding salt, fried foods.    Depression screen [facility] 2/9 09/19/2018 07/27/2018 04/10/2018 01/25/2018 12/14/2016  Decreased Interest 0 0 0 0 1  Down, Depressed, Hopeless 0 0 0 0 0  PHQ - 2 Score 0 0 0 0 1  Some recent data might be hidden     Relevant past medical, surgical, family and social history reviewed and updated as indicated.  Interim medical history since our last visit reviewed. Allergies and medications reviewed and updated.  ROS:  Review of Systems  Constitutional: Negative.   HENT: Positive for trouble swallowing.   Eyes: Negative for visual disturbance.  Respiratory: Positive for shortness of breath. Negative for cough.   Cardiovascular: Negative for chest pain and leg swelling.  Gastrointestinal: Positive for abdominal pain, nausea and vomiting. Negative for anal bleeding, blood in stool, constipation, diarrhea and rectal pain.  Genitourinary: Negative for difficulty urinating.  Musculoskeletal: Positive for back pain. Negative for arthralgias and myalgias.  Skin: Negative for rash.  Neurological: Negative for headaches.  Psychiatric/Behavioral: Positive for sleep disturbance.     Social History   Tobacco Use  Smoking Status Never Smoker  Smokeless Tobacco Current User  . Types: Snuff  Tobacco Comment   dips snuff about 6 cans/week for 3 years       Objective:     Wt Readings from Last 3 Encounters:  08/25/18 222 lb (100.7 kg)  08/20/18 222 lb (100.7 kg)  07/27/18 210 lb (95.3 kg)     Exam  deferred. Pt. [place] due to COVID 19. Phone visit performed.   Assessment & Plan:   1. Esophageal dysphagia   2. Gastroesophageal reflux disease, esophagitis presence not specified   3. 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 pantoprazole (PROTONIX) 40 MG tablet    Sig: Take 1 tablet (40 mg total) by mouth 2 (two) times daily.    Dispense:  180 tablet    Refill:  0    Orders Placed This Encounter  Procedures  . US Abdomen Complete    Standing Status:   Future    Standing Expiration Date:   01/21/2020    Order Specific Question:   Reason for Exam (SYMPTOM  OR DIAGNOSIS REQUIRED)    Answer:   nausea, dysphagia, shoulder blade pain, melena    Order Specific Question:   Preferred imaging location?    Answer:   [facility]  . CBC with Differential/Platelet  . CMP14+EGFR    Order Specific Question:   Has the patient fasted?    Answer:   Yes  . Brain natriuretic peptide      Diagnoses and all orders for this visit:  Esophageal dysphagia  Gastroesophageal reflux disease, esophagitis presence not specified -     pantoprazole (PROTONIX) 40 MG tablet; Take 1 tablet (40 mg total) by mouth 2 (two) times daily. -     CBC with  Differential/Platelet -     CMP14+EGFR -     Brain natriuretic peptide -     US Abdomen Complete; Future  Type 2 diabetes mellitus with hyperosmolar nonketotic hyperglycemia ([place])    Virtual Visit via telephone Note  I discussed the limitations, risks, security and privacy concerns of performing an evaluation and management service by telephone and the availability of in person appointments. The patient was identified with two identifiers. Pt.expressed understanding and agreed to proceed. Pt. Is at home. Dr. Livia Snellen is in his office.  Follow Up Instructions:   I discussed the assessment and treatment plan with the patient. The patient was provided an opportunity  to ask questions and all were answered. The patient agreed with the plan and demonstrated an understanding of the instructions.   The patient was advised to call back or seek an in-person evaluation if the symptoms worsen or if the condition fails to improve as anticipated.   Total minutes including chart review and phone contact time: 26   Follow up plan: Return in about 2 weeks (around 12/05/2018), or if symptoms worsen or fail to improve, for in office.  [name], MD [place]

## 2018-11-22 ENCOUNTER — Other Ambulatory Visit: Payer: Medicare Other

## 2018-11-22 ENCOUNTER — Other Ambulatory Visit: Payer: Self-pay

## 2018-11-22 DIAGNOSIS — K219 Gastro-esophageal reflux disease without esophagitis: Secondary | ICD-10-CM | POA: Diagnosis not present

## 2018-11-23 ENCOUNTER — Telehealth: Payer: Self-pay | Admitting: Family Medicine

## 2018-11-23 NOTE — Telephone Encounter (Signed)
Aware of results and results faxed to Dr. Lowanda Foster

## 2018-11-27 LAB — CBC WITH DIFFERENTIAL/PLATELET
Basophils Absolute: 0.1 10*3/uL (ref 0.0–0.2)
Basos: 1 %
EOS (ABSOLUTE): 0.1 10*3/uL (ref 0.0–0.4)
Eos: 2 %
Hematocrit: 44.6 % (ref 37.5–51.0)
Hemoglobin: 14.6 g/dL (ref 13.0–17.7)
Immature Grans (Abs): 0 10*3/uL (ref 0.0–0.1)
Immature Granulocytes: 0 %
Lymphocytes Absolute: 1.4 10*3/uL (ref 0.7–3.1)
Lymphs: 21 %
MCH: 27.3 pg (ref 26.6–33.0)
MCHC: 32.7 g/dL (ref 31.5–35.7)
MCV: 84 fL (ref 79–97)
Monocytes Absolute: 0.7 10*3/uL (ref 0.1–0.9)
Monocytes: 11 %
Neutrophils Absolute: 4.3 10*3/uL (ref 1.4–7.0)
Neutrophils: 65 %
Platelets: 281 10*3/uL (ref 150–450)
RBC: 5.34 x10E6/uL (ref 4.14–5.80)
RDW: 13.7 % (ref 11.6–15.4)
WBC: 6.7 10*3/uL (ref 3.4–10.8)

## 2018-11-27 LAB — CMP14+EGFR
ALT: 24 IU/L (ref 0–44)
AST: 23 IU/L (ref 0–40)
Albumin/Globulin Ratio: 1.3 (ref 1.2–2.2)
Albumin: 4 g/dL (ref 3.8–4.8)
Alkaline Phosphatase: 115 IU/L (ref 39–117)
BUN/Creatinine Ratio: 23 (ref 10–24)
BUN: 76 mg/dL (ref 8–27)
Bilirubin Total: 0.3 mg/dL (ref 0.0–1.2)
CO2: 26 mmol/L (ref 20–29)
Calcium: 9.4 mg/dL (ref 8.6–10.2)
Chloride: 88 mmol/L — ABNORMAL LOW (ref 96–106)
Creatinine, Ser: 3.36 mg/dL (ref 0.76–1.27)
GFR calc Af Amer: 21 mL/min/{1.73_m2} — ABNORMAL LOW (ref >59)
GFR calc non Af Amer: 19 mL/min/{1.73_m2} — ABNORMAL LOW (ref >59)
Globulin, Total: 3.1 g/dL (ref 1.5–4.5)
Glucose: 285 mg/dL — ABNORMAL HIGH (ref 65–99)
Potassium: 4 mmol/L (ref 3.5–5.2)
Sodium: 131 mmol/L — ABNORMAL LOW (ref 134–144)
Total Protein: 7.1 g/dL (ref 6.0–8.5)

## 2018-11-27 LAB — BRAIN NATRIURETIC PEPTIDE: BNP: 74.8 pg/mL (ref 0.0–100.0)

## 2018-11-28 ENCOUNTER — Other Ambulatory Visit: Payer: Self-pay

## 2018-11-28 ENCOUNTER — Other Ambulatory Visit: Payer: Self-pay | Admitting: Family Medicine

## 2018-11-28 ENCOUNTER — Ambulatory Visit (HOSPITAL_COMMUNITY)
Admission: RE | Admit: 2018-11-28 | Discharge: 2018-11-28 | Disposition: A | Discharge status: Home / Self Care | Payer: Medicare Other | Source: Ambulatory Visit | Attending: Family Medicine | Admitting: Family Medicine

## 2018-11-28 ENCOUNTER — Other Ambulatory Visit (HOSPITAL_COMMUNITY): Payer: Self-pay | Admitting: Internal Medicine

## 2018-11-28 DIAGNOSIS — K219 Gastro-esophageal reflux disease without esophagitis: Secondary | ICD-10-CM | POA: Insufficient documentation

## 2018-11-28 DIAGNOSIS — R110 Nausea: Secondary | ICD-10-CM | POA: Diagnosis not present

## 2018-12-13 ENCOUNTER — Other Ambulatory Visit: Payer: Self-pay

## 2018-12-13 NOTE — Patient Outreach (Signed)
[facility]) Care Management  12/13/2018  Brad Mendoza [birth year] [id number]    1st unsuccessful outreach to the patient for assessment.  No answer.  The line was busy and the mailbox has not been set up.  Plan: RN Health Coach will send letter. RN Health Coach will make outreach attempt to the patient within thirty business days.   [name] RN, BSN, [place] Direct Dial:  [phone]  Fax: [phone]

## 2018-12-14 ENCOUNTER — Other Ambulatory Visit: Payer: Self-pay

## 2018-12-14 ENCOUNTER — Telehealth (HOSPITAL_COMMUNITY): Payer: Self-pay | Admitting: Cardiology

## 2018-12-14 NOTE — Telephone Encounter (Signed)
Patient called to request an appointment,[facility] case manager advised patient to get a appt with recent concerns regarding twinges in chest Denied active chest pains, increased SOB, arm/jaw pain,HA  Follow up appt given for next available advised if patient became symptomatic he should report to ER for further evaluation

## 2018-12-14 NOTE — Patient Outreach (Signed)
[facility]) Care Management  12/14/2018   Brad Mendoza 26-Sep-1955 [id number]  Subjective: Successful outreach to the patient.  Two patient identifiers obtained.  The patient states that he is doing ok.  He denies any falls, chest pain shortness of breath or swelling. He states that he is weighing daily and his weight is 179 lbs.  He reports that his medications are the same and he is taking them as prescribed. He does state that he has been having some "twinging" in his chest.  He was unable to rate the pain.  He states that the pain comes and goes.  It has been doing this for a while.  Advised the patient to call and make an appointment.  He states that he has called and could not get through to his cardiology office.  RN Health Coach called for the patient to cardiology office and was able to get the receptionist quickly and sent to the nurses voice mail to give information to have the patient called.  I called the patient back to let him know that someone will be calling him.  I reviewed the office number with him to make sure he was calling the correct one.  I also called and left a message with his nephrologist for him.  They will call him to set up an appointment for follow up.  The patient states that he no longer has visits with the [facility].  I also notified the patient that I will be transitioning to another position and one of my colleagues will be following up with him at the next scheduled call.  Current Medications:  Current Outpatient Medications  Medication Sig Dispense Refill  . aspirin EC 81 MG tablet Take 81 mg by mouth daily.    [name] BRILINTA 90 MG TABS tablet TAKE ONE TABLET BY MOUTH TWICE DAILY 60 tablet 1  . Capsaicin (ICY HOT ARTHRITIS THERAPY EX) Apply 1 application topically 2 (two) times daily as needed (back pain).     . carvedilol (COREG) 6.25 MG tablet TAKE ONE TABLET TWICE A DAY WITH MEALS. 60 tablet 2  . cetirizine (ZYRTEC) 10 MG tablet Take 1  tablet (10 mg total) by mouth daily. For itching 90 tablet 3  . Cholecalciferol (VITAMIN D3) 25 MCG (1000 UT) CAPS Take 2,000 Units by mouth daily.    . empagliflozin (JARDIANCE) 10 MG TABS tablet Take 10 mg by mouth daily. 30 tablet 6  . fluticasone (FLONASE) 50 MCG/ACT nasal spray USE 2 SPRAYS IN EACH NOSTRIL DAILY 48 g 1  . glucose blood (ONETOUCH VERIO) test strip USE FOUR TIMES DAILY AS NEEDED Dx E11.9 100 each 11  . hydrALAZINE (APRESOLINE) 100 MG tablet TAKE ONE (1) TABLET THREE (3) TIMES EACH DAY 90 tablet 2  . isosorbide mononitrate (IMDUR) 120 MG 24 hr tablet TAKE ONE (1) TABLET EACH DAY 30 tablet 2  . metFORMIN (GLUCOPHAGE-XR) 750 MG 24 hr tablet TAKE 1 TABLET EVERY MORNING WITH BREAKFAST 90 tablet 0  . metolazone (ZAROXOLYN) 5 MG tablet TAKE 1 TABLET TWICE WEEKLY OM MONDAYS & FRIDAYS 10 tablet 5  . Multiple Vitamins-Minerals (CENTRUM SILVER ADULT 50+ PO) Take 1 tablet by mouth daily.    . nabumetone (RELAFEN) 500 MG tablet TAKE TWO TABLETS BY MOUTH TWICE DAILY 360 tablet 1  . nitroGLYCERIN (NITROSTAT) 0.4 MG SL tablet DISSOLVE 1 TAB UNDER TOUNGE FOR CHEST PAIN. MAY REPEAT EVERY 5 MINUTES FOR 3 DOSES. IF NO RELIEF CALL 911 OR GO TO  ER 25 tablet 2  . Omega-3 Fatty Acids (FISH OIL) 1000 MG CPDR Take 1,000 mg by mouth daily.     . pantoprazole (PROTONIX) 40 MG tablet Take 1 tablet (40 mg total) by mouth 2 (two) times daily. 180 tablet 0  . pregabalin (LYRICA) 225 MG capsule TAKE ONE CAPSULE BY MOUTH AT BEDTIME 30 capsule 0  . PROAIR HFA 108 (90 Base) MCG/ACT inhaler USE 2 PUFFS EVERY 6 HOURS AS NEEDED 8.5 g 3  . ranolazine (RANEXA) 500 MG 12 hr tablet TAKE 1 TABLET BY MOUTH TWICE DAILY. 60 tablet 0  . rosuvastatin (CRESTOR) 10 MG tablet TAKE ONE (1) TABLET EACH DAY 90 tablet 0  . terbinafine (LAMISIL) 1 % cream Apply 1 application topically 2 (two) times daily. Apply to both feet and between toes 30 g 1  . torsemide (DEMADEX) 20 MG tablet TAKE 3 TABLETS DAILY 90 tablet 2  . TOUJEO MAX  SOLOSTAR 300 UNIT/ML SOPN INJECT 45 UNITS SQ DAILY BEFORE BREAKFAST & 45 UNITS DAILY BEFORE SUPPER 6 mL 2   Current Facility-Administered Medications  Medication Dose Route Frequency Provider Last Rate Last Dose  . aflibercept (EYLEA) SOLN 2 mg  2 mg Intravitreal  [name], MD   2 mg at 04/25/18 0029    Functional Status:  In your present state of health, do you have any difficulty performing the following activities: 09/19/2018 03/20/2018  Hearing? N -  Vision? N -  Comment pt had eye exam last year sometime but he can't remember what month -  Difficulty concentrating or making decisions? N -  Walking or climbing stairs? Y -  Comment due to knee pain -  Dressing or bathing? N -  Doing errands, shopping? N N  Preparing Food and eating ? N -  Using the Toilet? N -  In the past six months, have you accidently leaked urine? N -  Do you have problems with loss of bowel control? N -  Managing your Medications? Y -  Comment he gets his medications in a blister pack from pharmacy -  Managing your Finances? N -  Housekeeping or managing your Housekeeping? N -  Some recent data might be hidden    Fall/Depression Screening: Fall Risk  12/14/2018 12/14/2018 09/19/2018  Falls in the past year? 0 0 0  Comment - - -  Number falls in past yr: - - -  Injury with Fall? - - -  Comment - - -  Risk for fall due to : - - -  Follow up - - -   PHQ 2/9 Scores 09/19/2018 07/27/2018 04/10/2018 01/25/2018 12/14/2016 12/02/2016 09/26/2016  PHQ - 2 Score 0 0 0 0 1 0 0    Assessment: Patient will continue to benefit from health coach outreach for disease management and support. [facility] CM Care Plan Problem One     Most Recent Value  [facility] Long Term Goal   In 60 days the patient will verbalize that he is staying out of the hospital for chf  [facility] Long Term Goal Start Date  12/14/18  Interventions for Problem One Long Term Goal  Discussed signs and symptoms of CHF, Reinterated about fod intake, encouraged  medication adherence, encouraged the patient to keep appointments       Plan: RN Health Coach will contact patient in the month of December and patient agrees to next outreach.   [name] RN, BSN, [place] Direct Dial:  [phone]  Fax: [phone]

## 2018-12-17 ENCOUNTER — Telehealth (HOSPITAL_COMMUNITY): Payer: Self-pay | Admitting: *Deleted

## 2018-12-17 NOTE — Telephone Encounter (Signed)
Pt called to get his appt date and time. I called pt back and told him his appt is 10/29 at 2:30pm patient thanked me for the call.

## 2018-12-20 ENCOUNTER — Encounter (HOSPITAL_COMMUNITY): Payer: Self-pay

## 2018-12-20 ENCOUNTER — Other Ambulatory Visit: Payer: Self-pay

## 2018-12-20 ENCOUNTER — Ambulatory Visit (HOSPITAL_COMMUNITY)
Admission: RE | Admit: 2018-12-20 | Discharge: 2018-12-20 | Disposition: A | Discharge status: Home / Self Care | Payer: Medicare Other | Source: Ambulatory Visit | Attending: Cardiology | Admitting: Cardiology

## 2018-12-20 VITALS — BP 128/90 | HR 87 | Wt 210.4 lb

## 2018-12-20 DIAGNOSIS — F1911 Other psychoactive substance abuse, in remission: Secondary | ICD-10-CM | POA: Diagnosis not present

## 2018-12-20 DIAGNOSIS — Z833 Family history of diabetes mellitus: Secondary | ICD-10-CM | POA: Insufficient documentation

## 2018-12-20 DIAGNOSIS — I2510 Atherosclerotic heart disease of native coronary artery without angina pectoris: Secondary | ICD-10-CM | POA: Diagnosis not present

## 2018-12-20 DIAGNOSIS — K219 Gastro-esophageal reflux disease without esophagitis: Secondary | ICD-10-CM | POA: Diagnosis not present

## 2018-12-20 DIAGNOSIS — Z7982 Long term (current) use of aspirin: Secondary | ICD-10-CM | POA: Insufficient documentation

## 2018-12-20 DIAGNOSIS — Z955 Presence of coronary angioplasty implant and graft: Secondary | ICD-10-CM | POA: Diagnosis not present

## 2018-12-20 DIAGNOSIS — N179 Acute kidney failure, unspecified: Secondary | ICD-10-CM | POA: Diagnosis not present

## 2018-12-20 DIAGNOSIS — E1122 Type 2 diabetes mellitus with diabetic chronic kidney disease: Secondary | ICD-10-CM | POA: Insufficient documentation

## 2018-12-20 DIAGNOSIS — I5022 Chronic systolic (congestive) heart failure: Secondary | ICD-10-CM | POA: Insufficient documentation

## 2018-12-20 DIAGNOSIS — Z791 Long term (current) use of non-steroidal anti-inflammatories (NSAID): Secondary | ICD-10-CM | POA: Insufficient documentation

## 2018-12-20 DIAGNOSIS — I428 Other cardiomyopathies: Secondary | ICD-10-CM | POA: Diagnosis not present

## 2018-12-20 DIAGNOSIS — R0683 Snoring: Secondary | ICD-10-CM | POA: Diagnosis not present

## 2018-12-20 DIAGNOSIS — Z79899 Other long term (current) drug therapy: Secondary | ICD-10-CM | POA: Insufficient documentation

## 2018-12-20 DIAGNOSIS — R5383 Other fatigue: Secondary | ICD-10-CM | POA: Diagnosis not present

## 2018-12-20 DIAGNOSIS — I130 Hypertensive heart and chronic kidney disease with heart failure and stage 1 through stage 4 chronic kidney disease, or unspecified chronic kidney disease: Secondary | ICD-10-CM | POA: Insufficient documentation

## 2018-12-20 DIAGNOSIS — Z809 Family history of malignant neoplasm, unspecified: Secondary | ICD-10-CM | POA: Diagnosis not present

## 2018-12-20 DIAGNOSIS — E785 Hyperlipidemia, unspecified: Secondary | ICD-10-CM | POA: Insufficient documentation

## 2018-12-20 DIAGNOSIS — F1010 Alcohol abuse, uncomplicated: Secondary | ICD-10-CM | POA: Insufficient documentation

## 2018-12-20 DIAGNOSIS — N183 Chronic kidney disease, stage 3 unspecified: Secondary | ICD-10-CM | POA: Insufficient documentation

## 2018-12-20 DIAGNOSIS — Z8249 Family history of ischemic heart disease and other diseases of the circulatory system: Secondary | ICD-10-CM | POA: Diagnosis not present

## 2018-12-20 DIAGNOSIS — R6889 Other general symptoms and signs: Secondary | ICD-10-CM | POA: Diagnosis not present

## 2018-12-20 DIAGNOSIS — Z794 Long term (current) use of insulin: Secondary | ICD-10-CM | POA: Insufficient documentation

## 2018-12-20 DIAGNOSIS — Z880 Allergy status to penicillin: Secondary | ICD-10-CM | POA: Diagnosis not present

## 2018-12-20 DIAGNOSIS — Z882 Allergy status to sulfonamides status: Secondary | ICD-10-CM | POA: Diagnosis not present

## 2018-12-20 LAB — BRAIN NATRIURETIC PEPTIDE: B Natriuretic Peptide: 105.2 pg/mL — ABNORMAL HIGH (ref 0.0–100.0)

## 2018-12-20 MED ORDER — CARVEDILOL 6.25 MG PO TABS: ORAL_TABLET | ORAL | 2 refills | Status: DC

## 2018-12-20 NOTE — Patient Instructions (Signed)
Labs were done today. We will only call you if there are any Abnormalities. No Call Is A Good Call!!   Medication Change: Increase Coreg ( Carvedilol ) to 2 tablets in the Morning and 2 tablets in the Evening.   Your Provider would like you to do a Sleep Study. We will RESEND your information for that Company to get in touch with you.  Your Provider would like for you to Follow up in 4 weeks with an Echocardiogram first and then be seen by Dr. Aundra Dubin.  At the [facility], you and your health needs are our priority. As part of our continuing mission to provide you with exceptional heart care, we have created designated Provider Care Teams. These Care Teams include your primary Cardiologist (physician) and Advanced Practice Providers (APPs- Physician Assistants and Nurse Practitioners) who all work together to provide you with the care you need, when you need it.   You may see any of the following providers on your designated Care Team at your next follow up: [name] Dr Glori Bickers . Dr Loralie Champagne . Darrick Grinder, NP . Lyda Jester, PA   Please be sure to bring in all your medications bottles to every appointment.

## 2018-12-20 NOTE — Progress Notes (Unsigned)
ReDS Vest / Clip - 12/20/18 1400      ReDS Vest / Clip   Station Marker  C    Ruler Value  31    ReDS Value  Moderate volume overload

## 2018-12-20 NOTE — Progress Notes (Signed)
Heart Failure Clinic Note   Date:  12/20/2018   ID:  Brad Mendoza, DOB 09-Jan-1956, MRN [id number]  Location: Home  Provider location: [place] Advanced Heart Failure Type of Visit: Established patient  PCP:  [name], MD  Cardiologist:  No primary care provider on file. Primary HF: Dr Haroldine Laws  Nephrology: Dr Hinda Lenis  Chief Complaint/ Reason for Visit: Routine F/u for Chronic Systolic Heart Failure   History of Present Illness: Brad Mendoza is a 63 y.o. male with a history of systolic HF due to NICM, HTN, HLD, DM and CAD.  He was admitted from cath lab 03/19/18 with volume overload.Cath showed 90% RCA stenosis (worsened) and heunderwent PCI/DES to RCA by Dr. Ellyn Hack.He was started on Brilinta in addition to his home ASA.Brilinta was provided through [facility].HF team was consulted due to cardiorenal syndrome. He was started on milrinone 0.125 mcg/kg/min to augment diuresis. He diuresed with high dose IV lasix and metolazone and transitioned totorsemide 60 mg daily + metolazone 5 mg on Mondays and Fridays.ReDS clip reading improved from 45% to 33%. He was set up with [facility] vest protocol for home. HF medications optimized. Entresto and spiroheld with AKI. Coreg decreased with need for milrinone.DC weight 206 lbs.  Seen in ED 2/26 with high blood sugars 400-500. Given IVF and Toujeo adjusted with instructions to follow up with PCP.  Last visit was in June. This was a telehealth visit due to [place] pandemic. At that time, he reported doing ok and noted that his PCP had increased diuretics and home weights had been stable. He was seen 08/2018 in the ED for dyspnea and noted to be mildly volume overloaded. BNP mildly elevated at 494. He was given dose of IV lasix in the ED w/ improvement in symptoms and advised to temporarily increase his torsemide x 2 days. He has been followed by home health. He gets vest readings to monitor volume status and HF protocol  followed.   Today in clinic, he reports that he has done fairly well. He denies any exertional dyspnea. He walks for exercise, 1.5-2.0 miles at a time. Limited to some degree by bilateral hip pain and has to stop and rest but no exertional CP or dyspnea. He has twinges of CP periodically that is positional but no ischemic type CP. Reports full compliance w/ DAPT. No abnormal bleeding. Compliant w/ diuretics and notes good urinary output. ReDs clip measurement slightly elevated at 37%. BP controlled at 128/90. HR 87 bpm.      Past Medical History:  Diagnosis Date  . Acid reflux   . Back pain    f4 and f5  . CAD (coronary artery disease)   . CHF (congestive heart failure) ([place])    last echo was in March 2016  . Diabetes mellitus 2005  . Diverticulosis   . H/O chest pain 2005   ECHO  . HTN (hypertension)   . Hyperlipidemia   . Nonischemic cardiomyopathy ([place])   . Onychomycosis   . Polysubstance abuse [facility])    Past Surgical History:  Procedure Laterality Date  . APPENDECTOMY    . CARDIAC CATHETERIZATION  2005   4 frech cath  . COLONOSCOPY N/A 08/01/2013   Procedure: COLONOSCOPY;  Surgeon: Rogene [place], MD;  Location[facility];  Service: Endoscopy;  Laterality: N/A;  rescheduled to [place] notified pt  . CORONARY STENT INTERVENTION N/A 03/19/2018   Procedure: CORONARY STENT INTERVENTION;  Surgeon: Leonie Man, MD;  Location: [place] CV LAB;  Service: Cardiovascular;  Laterality: N/A;  . ESOPHAGOGASTRODUODENOSCOPY N/A 04/23/2015   Procedure: ESOPHAGOGASTRODUODENOSCOPY (EGD);  Surgeon: Rogene [place], MD;  Location: [facility];  Service: Endoscopy;  Laterality: N/A;  1:25  . RIGHT/LEFT HEART CATH AND CORONARY ANGIOGRAPHY N/A 06/03/2016   Procedure: Right/Left Heart Cath and Coronary Angiography;  Surgeon: Belva Crome, MD;  Location: [place] CV LAB;  Service: Cardiovascular;  Laterality: N/A;  . RIGHT/LEFT HEART CATH AND CORONARY ANGIOGRAPHY N/A 03/19/2018    Procedure: RIGHT/LEFT HEART CATH AND CORONARY ANGIOGRAPHY;  Surgeon: Jolaine Artist, MD;  Location: [place] CV LAB;  Service: Cardiovascular;  Laterality: N/A;     Current Outpatient Medications  Medication Sig Dispense Refill  . aspirin EC 81 MG tablet Take 81 mg by mouth daily.    [name] BRILINTA 90 MG TABS tablet TAKE ONE TABLET BY MOUTH TWICE DAILY 60 tablet 1  . Capsaicin (ICY HOT ARTHRITIS THERAPY EX) Apply 1 application topically 2 (two) times daily as needed (back pain).     . carvedilol (COREG) 6.25 MG tablet TAKE ONE TABLET TWICE A DAY WITH MEALS. 60 tablet 2  . cetirizine (ZYRTEC) 10 MG tablet Take 1 tablet (10 mg total) by mouth daily. For itching 90 tablet 3  . Cholecalciferol (VITAMIN D3) 25 MCG (1000 UT) CAPS Take 2,000 Units by mouth daily.    . empagliflozin (JARDIANCE) 10 MG TABS tablet Take 10 mg by mouth daily. 30 tablet 6  . fluticasone (FLONASE) 50 MCG/ACT nasal spray USE 2 SPRAYS IN EACH NOSTRIL DAILY 48 g 1  . glucose blood (ONETOUCH VERIO) test strip USE FOUR TIMES DAILY AS NEEDED Dx E11.9 100 each 11  . hydrALAZINE (APRESOLINE) 100 MG tablet TAKE ONE (1) TABLET THREE (3) TIMES EACH DAY 90 tablet 2  . isosorbide mononitrate (IMDUR) 120 MG 24 hr tablet TAKE ONE (1) TABLET EACH DAY 30 tablet 2  . metFORMIN (GLUCOPHAGE-XR) 750 MG 24 hr tablet TAKE 1 TABLET EVERY MORNING WITH BREAKFAST 90 tablet 0  . metolazone (ZAROXOLYN) 5 MG tablet TAKE 1 TABLET TWICE WEEKLY OM MONDAYS & FRIDAYS 10 tablet 5  . Multiple Vitamins-Minerals (CENTRUM SILVER ADULT 50+ PO) Take 1 tablet by mouth daily.    . nabumetone (RELAFEN) 500 MG tablet TAKE TWO TABLETS BY MOUTH TWICE DAILY 360 tablet 1  . nitroGLYCERIN (NITROSTAT) 0.4 MG SL tablet DISSOLVE 1 TAB UNDER TOUNGE FOR CHEST PAIN. MAY REPEAT EVERY 5 MINUTES FOR 3 DOSES. IF NO RELIEF CALL 911 OR GO TO ER 25 tablet 2  . Omega-3 Fatty Acids (FISH OIL) 1000 MG CPDR Take 1,000 mg by mouth daily.     . pantoprazole (PROTONIX) 40 MG tablet Take 1  tablet (40 mg total) by mouth 2 (two) times daily. 180 tablet 0  . pregabalin (LYRICA) 225 MG capsule TAKE ONE CAPSULE BY MOUTH AT BEDTIME 30 capsule 0  . PROAIR HFA 108 (90 Base) MCG/ACT inhaler USE 2 PUFFS EVERY 6 HOURS AS NEEDED 8.5 g 3  . ranolazine (RANEXA) 500 MG 12 hr tablet TAKE 1 TABLET BY MOUTH TWICE DAILY. 60 tablet 0  . rosuvastatin (CRESTOR) 10 MG tablet TAKE ONE (1) TABLET EACH DAY 90 tablet 0  . terbinafine (LAMISIL) 1 % cream Apply 1 application topically 2 (two) times daily. Apply to both feet and between toes 30 g 1  . torsemide (DEMADEX) 20 MG tablet TAKE 3 TABLETS DAILY 90 tablet 2  . TOUJEO MAX SOLOSTAR 300 UNIT/ML SOPN  INJECT 45 UNITS SQ DAILY BEFORE BREAKFAST & 45 UNITS DAILY BEFORE SUPPER 6 mL 2   Current Facility-Administered Medications  Medication Dose Route Frequency Provider Last Rate Last Dose  . aflibercept (EYLEA) SOLN 2 mg  2 mg Intravitreal  [name], MD   2 mg at 04/25/18 0029    Allergies:   Sulfa antibiotics and Penicillins   Social History:  The patient  reports that he has never smoked. His smokeless tobacco use includes snuff. He reports previous alcohol use of about 1.0 standard drinks of alcohol per week. He reports current drug use. Drug: Marijuana.   Family History:  The patient's family history includes Alcohol abuse in his brother; Cancer in his mother; Diabetes in his father; Hypertension in his father.   ROS:  Please see the history of present illness.   All other systems are personally reviewed and negative.   Vitals:   12/20/18 1359  BP: 128/90  Pulse: 87  Weight: 95.4 kg (210 lb 6.4 oz)  SpO2: 97%   Exam:   PHYSICAL EXAM: General:  Well appearing. No respiratory difficulty HEENT: normal Neck: supple. no JVD. Carotids 2+ bilat; no bruits. No lymphadenopathy or thyromegaly appreciated. Cor: PMI nondisplaced. Regular rate & rhythm. No rubs, gallops or murmurs. Lungs: clear. ReDs clip 37% Abdomen: soft, nontender, nondistended.  No hepatosplenomegaly. No bruits or masses. Good bowel sounds. Extremities: no cyanosis, clubbing, rash, edema Neuro: alert & oriented x 3, cranial nerves grossly intact. moves all 4 extremities w/o difficulty. Affect pleasant.   Recent Labs: 03/26/2018: Magnesium 2.2 11/22/2018: ALT 24; BNP 74.8; BUN 76; Creatinine, Ser 3.36; Hemoglobin 14.6; Platelets 281; Potassium 4.0; Sodium 131  Personally reviewed   Wt Readings from Last 3 Encounters:  08/25/18 100.7 kg (222 lb)  08/20/18 100.7 kg (222 lb)  07/27/18 95.3 kg (210 lb)      ASSESSMENT AND PLAN:   1. 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 He had preexisting NICM diagnosed in 2005 when his EF was 35% and had normal cors. Echo in 05/2016 with EF 30-35% and cath showed 70-75% stenosis in the proximal and mid LAD,90% ostial stenosis in the 1st diagonal, and 60-70% stenosis in the mid RCA. [place] 05/2016- Fick C.O. 5.86/index 2.74 - Echo from 4/19 reviewed personally EF read as 40-45% but it is more like 25% - Echo1/20/20EF 30-35% - NYHA II symptoms. ReDs clip slightly elevated at 37% but asymptomatic. No peripheral edema. Pt advised to take an extra 20 mg of torsemide tomorrow (100 mg total), then return to 80 mg daily thereafter.  -Increase coreg to 12.5 mg BID. - Continue imdur 120 mg daily.  -Continuehydralazine 100 mg TID Arlyce Harman on hold with AKI. Delene Loll has been on hold withCKD  - Willeventually need ICD if EF remains <35% post PCI in 3 months.  - Repeat ECHO in 4-6 more weeks   2. CAD -s/p PCI/DES of RCA 03/19/2018. Other CAD stable. Manage medically -no ischemic type chest pain. He is active, walks 1.5-2.0 miles a/day w/o exertional symptoms  - Continue isosorbide as above.  - Continue Ranexa. - ContinueDAPTand statin (ASA and Brilinta) - Continue Crestor (LP followed by PCP). LDL goal < 70  3. ETOH abuse - Congratulated him on cessation.  4. Tobacco abuse He continue dip tobacco.  - Encouraged  complete cessation.  5. HTN: diastolic pressure a bit high, 90. HR 87 -increase Coreg to 12.5 bid -continue all other meds as outlined above  6. CKD stage III -  Baseline creatinine 1.7-2.2. Follows with Dr. Hinda Lenis -check BMP today   7. DM -Per PCP. - Avoiding SGLT2i with CRCl < 30  8. Fatigue/snoring-  - Repeat sleep study    .  Signed, Lyda Jester, PA-C  12/20/2018 1:51 PM  [place] [address] [place] [address] [phone] (office) [phone] (fax)

## 2018-12-24 ENCOUNTER — Telehealth (HOSPITAL_COMMUNITY): Payer: Self-pay

## 2018-12-24 ENCOUNTER — Other Ambulatory Visit (HOSPITAL_COMMUNITY): Payer: Self-pay | Admitting: Adult Health

## 2018-12-24 ENCOUNTER — Other Ambulatory Visit: Payer: Self-pay | Admitting: Family Medicine

## 2018-12-24 NOTE — Telephone Encounter (Signed)
Faxed [place] Sleep Study and the order to [facility] @ [phone] on 12/24/2018.

## 2018-12-27 NOTE — Progress Notes (Signed)
[facility] Note  01/03/2019     CHIEF COMPLAINT Patient presents for Retina Follow Up   HISTORY OF PRESENT ILLNESS: Brad Mendoza is a 63 y.o. male who presents to the clinic today for:   HPI    Retina Follow Up    Patient presents with  Diabetic Retinopathy.  In both eyes.  This started weeks ago.  Severity is moderate.  Duration of weeks.  Since onset it is stable.  I, the attending physician,  performed the HPI with the patient and updated documentation appropriately.          Comments    Patient states his vision is about the same.  He denies eye pain or discomfort.  Patient has occasional floaters OU; denies fol.       Last edited by [name], MD on 01/03/2019  8:02 AM. (History)     pt states he noticed new floaters a week of 2 ago, pt states his blood pressure has been running high lately  Referring physician: Harlen Labs, MD [id number] [place],  [place] [address]  HISTORICAL INFORMATION:   Selected notes from the MEDICAL RECORD NUMBER Referred by Dr. [name] for concern of CSME OS;  LEE- 04.04.19 ([name]) [BCVA OD: 20/20 OS: 20/40] Ocular Hx-  PMH- DM, CAD, CHF, HTN, hyperlipidemia    CURRENT MEDICATIONS: No current outpatient medications on file. (Ophthalmic Drugs)   Current Facility-Administered Medications (Ophthalmic Drugs)  Medication Route  . aflibercept (EYLEA) SOLN 2 mg Intravitreal   Current Outpatient Medications (Other)  Medication Sig  . aspirin EC 81 MG tablet Take 81 mg by mouth daily.  [name] BRILINTA 90 MG TABS tablet TAKE 1 TABLET BY MOUTH TWICE DAILY.  . Capsaicin (ICY HOT ARTHRITIS THERAPY EX) Apply 1 application topically 2 (two) times daily as needed (back pain).   . carvedilol (COREG) 6.25 MG tablet TAKE ONE TABLET TWICE A DAY WITH MEALS.  . cetirizine (ZYRTEC) 10 MG tablet Take 1 tablet (10 mg total) by mouth daily. For itching  . Cholecalciferol (VITAMIN D3) 25 MCG (1000 UT) CAPS Take 2,000 Units by  mouth daily.  . empagliflozin (JARDIANCE) 10 MG TABS tablet Take 10 mg by mouth daily.  . fluticasone (FLONASE) 50 MCG/ACT nasal spray USE 2 SPRAYS IN EACH NOSTRIL DAILY  . glucose blood (ONETOUCH VERIO) test strip USE FOUR TIMES DAILY AS NEEDED Dx E11.9  . hydrALAZINE (APRESOLINE) 100 MG tablet TAKE ONE (1) TABLET THREE (3) TIMES EACH DAY  . isosorbide mononitrate (IMDUR) 120 MG 24 hr tablet TAKE ONE (1) TABLET EACH DAY  . metFORMIN (GLUCOPHAGE-XR) 750 MG 24 hr tablet TAKE 1 TABLET EVERY MORNING WITH BREAKFAST  . metolazone (ZAROXOLYN) 5 MG tablet TAKE 1 TABLET TWICE WEEKLY OM MONDAYS & FRIDAYS  . Multiple Vitamins-Minerals (CENTRUM SILVER ADULT 50+ PO) Take 1 tablet by mouth daily.  . nabumetone (RELAFEN) 500 MG tablet TAKE TWO TABLETS BY MOUTH TWICE DAILY  . nitroGLYCERIN (NITROSTAT) 0.4 MG SL tablet DISSOLVE 1 TAB UNDER TOUNGE FOR CHEST PAIN. MAY REPEAT EVERY 5 MINUTES FOR 3 DOSES. IF NO RELIEF CALL 911 OR GO TO ER  . Omega-3 Fatty Acids (FISH OIL) 1000 MG CPDR Take 1,000 mg by mouth daily.   . pantoprazole (PROTONIX) 40 MG tablet Take 1 tablet (40 mg total) by mouth 2 (two) times daily.  . pregabalin (LYRICA) 225 MG capsule TAKE 1 CAPSULE BY MOUTH AT BEDTIME  . PROAIR HFA  108 (90 Base) MCG/ACT inhaler USE 2 PUFFS EVERY 6 HOURS AS NEEDED  . ranolazine (RANEXA) 500 MG 12 hr tablet TAKE 1 TABLET BY MOUTH TWICE DAILY.  . rosuvastatin (CRESTOR) 10 MG tablet TAKE ONE (1) TABLET EACH DAY  . terbinafine (LAMISIL) 1 % cream Apply 1 application topically 2 (two) times daily. Apply to both feet and between toes  . torsemide (DEMADEX) 20 MG tablet TAKE 3 TABLETS DAILY  . TOUJEO MAX SOLOSTAR 300 UNIT/ML SOPN INJECT 45 UNITS SQ DAILY BEFORE BREAKFAST & 45 UNITS DAILY BEFORE SUPPER   No current facility-administered medications for this visit.  (Other)      REVIEW OF SYSTEMS: ROS    Positive for: Endocrine, Cardiovascular, Eyes   Negative for: Constitutional, Gastrointestinal, Neurological, Skin,  Genitourinary, Musculoskeletal, HENT, Respiratory, Psychiatric, Allergic/Imm, Heme/Lymph   Last edited by Doneen Poisson on 01/03/2019  7:38 AM. (History)       ALLERGIES Allergies  Allergen Reactions  . Sulfa Antibiotics Shortness Of Breath  . Penicillins Rash and Other (See Comments)    Has patient had a PCN reaction causing immediate rash, facial/tongue/throat swelling, SOB or lightheadedness with hypotension: No Has patient had a PCN reaction causing severe rash involving mucus membranes or skin necrosis: No Has patient had a PCN reaction that required hospitalization No Has patient had a PCN reaction occurring within the last 10 years: No If all of the above answers are "NO", then may proceed with Cephalosporin use.     PAST MEDICAL HISTORY Past Medical History:  Diagnosis Date  . Acid reflux   . Back pain    f4 and f5  . CAD (coronary artery disease)   . CHF (congestive heart failure) ([place])    last echo was in March 2016  . Diabetes mellitus 2005  . Diverticulosis   . H/O chest pain 2005   ECHO  . HTN (hypertension)   . Hyperlipidemia   . Nonischemic cardiomyopathy ([place])   . Onychomycosis   . Polysubstance abuse [facility])    Past Surgical History:  Procedure Laterality Date  . APPENDECTOMY    . CARDIAC CATHETERIZATION  2005   4 frech cath  . COLONOSCOPY N/A 08/01/2013   Procedure: COLONOSCOPY;  Surgeon: Rogene Houston, MD;  [facility];  Service: Endoscopy;  Laterality: N/A;  rescheduled to [place] notified pt  . CORONARY STENT INTERVENTION N/A 03/19/2018   Procedure: CORONARY STENT INTERVENTION;  Surgeon: Leonie Man, MD;  Location: [facility];  Service: Cardiovascular;  Laterality: N/A;  . ESOPHAGOGASTRODUODENOSCOPY N/A 04/23/2015   Procedure: ESOPHAGOGASTRODUODENOSCOPY (EGD);  Surgeon: Rogene Houston, MD;  Location: [facility];  Service: Endoscopy;  Laterality: N/A;  1:25  . RIGHT/LEFT HEART CATH AND CORONARY ANGIOGRAPHY N/A  06/03/2016   Procedure: Right/Left Heart Cath and Coronary Angiography;  Surgeon: Belva Crome, MD;  Location: [facility] CV LAB;  Service: Cardiovascular;  Laterality: N/A;  . RIGHT/LEFT HEART CATH AND CORONARY ANGIOGRAPHY N/A 03/19/2018   Procedure: RIGHT/LEFT HEART CATH AND CORONARY ANGIOGRAPHY;  Surgeon: Jolaine Artist, MD;  Location: [place] CV LAB;  Service: Cardiovascular;  Laterality: N/A;    FAMILY HISTORY Family History  Problem Relation Age of Onset  . Hypertension Father   . Diabetes Father   . Cancer Mother   . Alcohol abuse Brother   . Colon cancer Neg Hx     SOCIAL HISTORY Social History   Tobacco Use  . Smoking status: Never Smoker  .  Smokeless tobacco: Current User    Types: Snuff  . Tobacco comment: dips snuff about 6 cans/week for 3 years  Substance Use Topics  . Alcohol use: Not Currently    Alcohol/week: 1.0 standard drinks    Types: 1 Cans of beer per week    Comment: occ - has cut down, now once every 2-3 months  . Drug use: Yes    Types: Marijuana    Comment: occasional use - smoking less         OPHTHALMIC EXAM:  Base Eye Exam    Visual Acuity (Snellen - Linear)      Right Left   Dist cc 20/50 -1 20/50 -3   Dist ph cc 20/40 -1 20/40 -2   Correction: Glasses       Tonometry (Tonopen, 7:40 AM)      Right Left   Pressure 15 13       Pupils      Dark Light Shape React APD   Right 4 3 Round Brisk 0   Left 4 3 Round Brisk 0       Visual [place]      Left Right    Full Full       Extraocular Movement      Right Left    Full Full       Neuro/Psych    Oriented x3: Yes   Mood/Affect: Normal       Dilation    Both eyes: 1.0% Mydriacyl, 2.5% Phenylephrine @ 7:40 AM        Slit Lamp and Fundus Exam    Slit Lamp Exam      Right Left   Lids/Lashes Dermatochalasis - upper lid Dermatochalasis - upper lid   Conjunctiva/Sclera Melanosis Melanosis   Cornea Arcus, trace inferior and central Punctate epithelial erosions  Arcus, trace inferior and central Punctate epithelial erosions, round k scar inferonasal para-central   Anterior Chamber Deep and quiet Deep and quiet   Iris Round and dilated, No NVI Round and dilated, No NVI   Lens 2+ Nuclear sclerosis, 2+ Cortical cataract, Vacuoles 2+ Nuclear sclerosis, 2+ Cortical cataract, Vacuoles   Vitreous Vitreous syneresis, prominent vitreous base nasally  Vitreous syneresis, prominent vitreous base nasally       Fundus Exam      Right Left   Disc mild Pallor, Sharp rim mild Pallor, Sharp rim   C/D Ratio 0.4 0.4   Macula Flat, good foveal reflex, mild cystic changes - improved from prior Blunted foveal reflex, scattered Mas/IRH, mild cystic changes, CWS along ST arcades   Vessels Mild Vascular attenuation, Tortuous Mild Vascular attenuation   Periphery Attached, scattered MAs, scattered White without pressure Attached, scattered DBH, scattered CWS -- improved, scattered White without pressure, boat shaped subhyaloid heme inferior to IT arcades        Refraction    Wearing Rx      Sphere Cylinder Axis Add   Right -0.25 +1.00 019 +2.50   Left -0.25 +1.00 153 +2.50          IMAGING AND PROCEDURES  Imaging and Procedures for 05/31/17  OCT, Retina - OU - Both Eyes       Right Eye Quality was good. Central Foveal Thickness: 285. Progression has improved. Findings include no SRF, intraretinal fluid, abnormal foveal contour, vitreomacular adhesion  (Mild Interval improvement in IRF and foveal profile).   Left Eye Quality was good. Central Foveal Thickness: 245. Progression has improved. Findings include normal foveal contour,  intraretinal fluid, no SRF (Mild interval improvement in IRF and foveal profile, but interval development in subhyaloid vitreous hyper reflectivities; boat shaped hemorrhage caught on widefield).   Notes *Images captured and stored on drive  Diagnosis / Impression:  DME OU, OS>OD Mild Interval improvement in IRF and foveal  profile Mild interval improvement in IRF and foveal profile, but interval development in subhyaloid vitreous hyper reflectivities; boat shaped hemorrhage caught on widefield  Clinical management:  See below  Abbreviations: NFP - Normal foveal profile. CME - cystoid macular edema. PED - pigment epithelial detachment. IRF - intraretinal fluid. SRF - subretinal fluid. EZ - ellipsoid zone. ERM - epiretinal membrane. ORA - outer retinal atrophy. ORT - outer retinal tubulation. SRHM - subretinal hyper-reflective material         Intravitreal Injection, Pharmacologic Agent - OD - Right Eye       Time Out 01/03/2019. 7:59 AM. Confirmed correct patient, procedure, site, and patient consented.   Anesthesia Topical anesthesia was used. Anesthetic medications included Lidocaine 2%, Proparacaine 0.5%.   Procedure Preparation included 5% betadine to ocular surface, eyelid speculum. A supplied needle was used.   Injection:  1.25 mg Bevacizumab (AVASTIN) SOLN   NDC: 76283-151-76, Lot: [id number] , Expiration date: 02/27/2019   Route: Intravitreal, Site: Right Eye, Waste: 0 mL  Post-op Post injection exam found visual acuity of at least counting fingers. The patient tolerated the procedure well. There were no complications. The patient received written and verbal post procedure care education.        Intravitreal Injection, Pharmacologic Agent - OS - Left Eye       Time Out 01/03/2019. 8:00 AM. Confirmed correct patient, procedure, site, and patient consented.   Anesthesia Topical anesthesia was used. Anesthetic medications included Lidocaine 2%, Proparacaine 0.5%.   Procedure Preparation included 5% betadine to ocular surface, eyelid speculum. A 30 gauge needle was used.   Injection:  2 mg aflibercept 24/01/2019) SOLN   NDC: Alfonse Flavors, [id number], Expiration date: 06/11/2019   Route: Intravitreal, Site: Left Eye, Waste: 0.05 mL  Post-op Post injection exam found visual acuity  of at least counting fingers. The patient tolerated the procedure well. There were no complications. The patient received written and verbal post procedure care education.                 ASSESSMENT/PLAN:    ICD-10-CM   1. Moderate nonproliferative diabetic retinopathy of both eyes with macular edema associated with type 2 diabetes mellitus (HCC)  06/24/2019 Intravitreal Injection, Pharmacologic Agent - OD - Right Eye    Intravitreal Injection, Pharmacologic Agent - OS - Left Eye    aflibercept (EYLEA) SOLN 2 mg    Bevacizumab (AVASTIN) SOLN 1.25 mg  2. Retinal edema  H35.81 OCT, Retina - OU - Both Eyes  3. Essential hypertension  I10   4. Hypertensive retinopathy of both eyes  H35.033   5. Combined forms of age-related cataract of both eyes  H25.813     1,2. Moderate to severe non-proliferative diabetic retinopathy, OU  - initial exam showed scattered IRH, exudates, and cotton wool spots, OS > OD, no NV  - FA 4.10.19 shows no NV OU  - S/P IVA OS #1 (05.29.19), #2 (06.26.19), #3 (07.29.19)  - S/P IVE OS #1 (08.30.19), #2 (10.01.19), #3 (10.31.19), #4 (03.02.20), #5 (04.07.20), #6 (07.01.20)  - S/P IVA OD #1 (04.07.20), #2 (07.01.20)  - h/o delayed f/u due to COVID 19 restrictions and concerns  - last A1c 13.1% on 6.5.2020 --  pt reports poor control due to poor diet  - today, delayed f/u from 4-6 wks to 4 mos  - exam shows interval development of subhyaloid heme OS  - OCT shows persistent, but interval improvement in diabetic macular edema OU  - BCVA 20/40 OU  - recommend IVA #3 OD and IVE #7 OS today (11.12.20)  - pt wishes to proceed  - RBA of procedure discussed, questions answered  - informed consent obtained and signed  - see procedure note  - Eylea4U paper work and benefits investigation started on 07.29.19 -- Approved for Good Days for 2020  - f/u in 4 weeks -- DFE; OCT; possible injection  3,4. Hypertensive retinopathy OU  - discussed importance of tight BP  control  - monitor  5. Combined form age-related cataract OU-   - The symptoms of cataract, surgical options, and treatments and risks were discussed with patient.  - discussed diagnosis and progression  - not yet visually significant  - monitor for now   Ophthalmic Meds Ordered this visit:  Meds ordered this encounter  Medications  . aflibercept (EYLEA) SOLN 2 mg  . Bevacizumab (AVASTIN) SOLN 1.25 mg       Return in about 4 weeks (around 01/31/2019) for f/u NPDR OU, DFE, OCT.  There are no Patient Instructions on file for this visit.   Explained the diagnoses, plan, and follow up with the patient and they expressed understanding.  Patient expressed understanding of the importance of proper follow up care.   This document serves as a record of services personally performed by Gardiner Sleeper, MD, PhD. It was created on their behalf by Leeann Must, [place], a certified ophthalmic assistant. The creation of this record is the provider's dictation and/or activities during the visit.    Electronically signed by: Leeann Must, [place] 11.11.2020 10:26 AM   This document serves as a record of services personally performed by Gardiner Sleeper, MD, PhD. It was created on their behalf by Ernest Mallick, OA, an ophthalmic assistant. The creation of this record is the provider's dictation and/or activities during the visit.    Electronically signed by: Ernest Mallick, OA 11.12.2020 10:26 AM   Gardiner Sleeper, M.D., Ph.D. Diseases & Surgery of the Retina and Vitreous [facility]  I have reviewed the above documentation for accuracy and completeness, and I agree with the above. Gardiner Sleeper, M.D., Ph.D. 01/03/19 10:26 AM    Abbreviations: M myopia (nearsighted); A astigmatism; H hyperopia (farsighted); P presbyopia; Mrx spectacle prescription;  CTL contact lenses; OD right eye; OS left eye; OU both eyes  XT exotropia; ET esotropia; PEK punctate epithelial keratitis;  PEE punctate epithelial erosions; DES dry eye syndrome; MGD meibomian gland dysfunction; ATs artificial tears; PFAT's preservative free artificial tears; [place] nuclear sclerotic cataract; PSC posterior subcapsular cataract; ERM epi-retinal membrane; PVD posterior vitreous detachment; RD retinal detachment; DM diabetes mellitus; DR diabetic retinopathy; NPDR non-proliferative diabetic retinopathy; PDR proliferative diabetic retinopathy; CSME clinically significant macular edema; DME diabetic macular edema; dbh dot blot hemorrhages; CWS cotton wool spot; POAG primary open angle glaucoma; C/D cup-to-disc ratio; HVF humphrey visual field; GVF goldmann visual field; OCT optical coherence tomography; IOP intraocular pressure; BRVO Branch retinal vein occlusion; CRVO central retinal vein occlusion; CRAO central retinal artery occlusion; BRAO branch retinal artery occlusion; RT retinal tear; SB scleral buckle; PPV pars plana vitrectomy; VH Vitreous hemorrhage; PRP panretinal laser photocoagulation; IVK intravitreal kenalog; VMT vitreomacular traction; MH Macular hole;  NVD neovascularization  of the disc; NVE neovascularization elsewhere; AREDS age related eye disease study; ARMD age related macular degeneration; POAG primary open angle glaucoma; EBMD epithelial/anterior basement membrane dystrophy; ACIOL anterior chamber intraocular lens; IOL intraocular lens; PCIOL posterior chamber intraocular lens; Phaco/IOL phacoemulsification with intraocular lens placement; [place] photorefractive keratectomy; LASIK laser assisted in situ keratomileusis; HTN hypertension; DM diabetes mellitus; COPD chronic obstructive pulmonary disease

## 2018-12-28 ENCOUNTER — Encounter (INDEPENDENT_AMBULATORY_CARE_PROVIDER_SITE_OTHER): Payer: Medicare Other | Admitting: Ophthalmology

## 2018-12-28 DIAGNOSIS — H3581 Retinal edema: Secondary | ICD-10-CM

## 2019-01-03 ENCOUNTER — Ambulatory Visit (INDEPENDENT_AMBULATORY_CARE_PROVIDER_SITE_OTHER): Payer: Medicare Other | Admitting: Ophthalmology

## 2019-01-03 ENCOUNTER — Encounter (INDEPENDENT_AMBULATORY_CARE_PROVIDER_SITE_OTHER): Payer: Self-pay | Admitting: Ophthalmology

## 2019-01-03 DIAGNOSIS — H35033 Hypertensive retinopathy, bilateral: Secondary | ICD-10-CM

## 2019-01-03 DIAGNOSIS — I10 Essential (primary) hypertension: Secondary | ICD-10-CM | POA: Diagnosis not present

## 2019-01-03 DIAGNOSIS — H25813 Combined forms of age-related cataract, bilateral: Secondary | ICD-10-CM | POA: Diagnosis not present

## 2019-01-03 DIAGNOSIS — H3581 Retinal edema: Secondary | ICD-10-CM | POA: Diagnosis not present

## 2019-01-03 DIAGNOSIS — E113313 Type 2 diabetes mellitus with moderate nonproliferative diabetic retinopathy with macular edema, bilateral: Secondary | ICD-10-CM

## 2019-01-03 MED ORDER — BEVACIZUMAB CHEMO INJECTION 1.25MG/0.05ML SYRINGE FOR KALEIDOSCOPE
1.2500 mg | INTRAVITREAL | Status: AC | PRN
  Administered 2019-01-03: 1.25 mg via INTRAVITREAL

## 2019-01-03 MED ORDER — AFLIBERCEPT 2MG/0.05ML IZ SOLN FOR KALEIDOSCOPE
2.0000 mg | INTRAVITREAL | Status: AC | PRN
  Administered 2019-01-03: 2 mg via INTRAVITREAL

## 2019-01-22 ENCOUNTER — Ambulatory Visit (HOSPITAL_COMMUNITY)
Admission: RE | Admit: 2019-01-22 | Discharge: 2019-01-22 | Disposition: A | Discharge status: Home / Self Care | Payer: Medicare Other | Source: Ambulatory Visit | Attending: Cardiology | Admitting: Cardiology

## 2019-01-22 ENCOUNTER — Other Ambulatory Visit: Payer: Self-pay

## 2019-01-22 ENCOUNTER — Ambulatory Visit (HOSPITAL_BASED_OUTPATIENT_CLINIC_OR_DEPARTMENT_OTHER)
Admission: RE | Admit: 2019-01-22 | Discharge: 2019-01-22 | Disposition: A | Discharge status: Home / Self Care | Payer: Medicare Other | Source: Ambulatory Visit | Attending: Cardiology | Admitting: Cardiology

## 2019-01-22 ENCOUNTER — Encounter (HOSPITAL_COMMUNITY): Payer: Self-pay | Admitting: Cardiology

## 2019-01-22 VITALS — BP 117/88 | HR 78 | Wt 202.8 lb

## 2019-01-22 DIAGNOSIS — Z79899 Other long term (current) drug therapy: Secondary | ICD-10-CM | POA: Insufficient documentation

## 2019-01-22 DIAGNOSIS — E1122 Type 2 diabetes mellitus with diabetic chronic kidney disease: Secondary | ICD-10-CM | POA: Diagnosis not present

## 2019-01-22 DIAGNOSIS — Z794 Long term (current) use of insulin: Secondary | ICD-10-CM | POA: Insufficient documentation

## 2019-01-22 DIAGNOSIS — I255 Ischemic cardiomyopathy: Secondary | ICD-10-CM | POA: Insufficient documentation

## 2019-01-22 DIAGNOSIS — N184 Chronic kidney disease, stage 4 (severe): Secondary | ICD-10-CM | POA: Insufficient documentation

## 2019-01-22 DIAGNOSIS — Z7982 Long term (current) use of aspirin: Secondary | ICD-10-CM | POA: Insufficient documentation

## 2019-01-22 DIAGNOSIS — Z8249 Family history of ischemic heart disease and other diseases of the circulatory system: Secondary | ICD-10-CM | POA: Insufficient documentation

## 2019-01-22 DIAGNOSIS — I2510 Atherosclerotic heart disease of native coronary artery without angina pectoris: Secondary | ICD-10-CM | POA: Insufficient documentation

## 2019-01-22 DIAGNOSIS — E785 Hyperlipidemia, unspecified: Secondary | ICD-10-CM

## 2019-01-22 DIAGNOSIS — R0683 Snoring: Secondary | ICD-10-CM

## 2019-01-22 DIAGNOSIS — Z791 Long term (current) use of non-steroidal anti-inflammatories (NSAID): Secondary | ICD-10-CM | POA: Diagnosis not present

## 2019-01-22 DIAGNOSIS — G4733 Obstructive sleep apnea (adult) (pediatric): Secondary | ICD-10-CM | POA: Diagnosis not present

## 2019-01-22 DIAGNOSIS — I428 Other cardiomyopathies: Secondary | ICD-10-CM | POA: Diagnosis not present

## 2019-01-22 DIAGNOSIS — F17290 Nicotine dependence, other tobacco product, uncomplicated: Secondary | ICD-10-CM | POA: Insufficient documentation

## 2019-01-22 DIAGNOSIS — I5042 Chronic combined systolic (congestive) and diastolic (congestive) heart failure: Secondary | ICD-10-CM | POA: Diagnosis not present

## 2019-01-22 DIAGNOSIS — I5022 Chronic systolic (congestive) heart failure: Secondary | ICD-10-CM | POA: Diagnosis not present

## 2019-01-22 DIAGNOSIS — Z833 Family history of diabetes mellitus: Secondary | ICD-10-CM | POA: Diagnosis not present

## 2019-01-22 DIAGNOSIS — I130 Hypertensive heart and chronic kidney disease with heart failure and stage 1 through stage 4 chronic kidney disease, or unspecified chronic kidney disease: Secondary | ICD-10-CM | POA: Diagnosis not present

## 2019-01-22 DIAGNOSIS — F1910 Other psychoactive substance abuse, uncomplicated: Secondary | ICD-10-CM | POA: Insufficient documentation

## 2019-01-22 LAB — BASIC METABOLIC PANEL
Anion gap: 13 (ref 5–15)
BUN: 67 mg/dL — ABNORMAL HIGH (ref 8–23)
CO2: 25 mmol/L (ref 22–32)
Calcium: 9.4 mg/dL (ref 8.9–10.3)
Chloride: 98 mmol/L (ref 98–111)
Creatinine, Ser: 3.11 mg/dL — ABNORMAL HIGH (ref 0.61–1.24)
GFR calc Af Amer: 24 mL/min — ABNORMAL LOW (ref >60)
GFR calc non Af Amer: 20 mL/min — ABNORMAL LOW (ref >60)
Glucose, Bld: 297 mg/dL — ABNORMAL HIGH (ref 70–99)
Potassium: 4.3 mmol/L (ref 3.5–5.1)
Sodium: 136 mmol/L (ref 135–145)

## 2019-01-22 LAB — LIPID PANEL
Cholesterol: 310 mg/dL — ABNORMAL HIGH (ref 0–200)
HDL: 38 mg/dL — ABNORMAL LOW (ref >40)
LDL Cholesterol: UNDETERMINED mg/dL (ref 0–99)
Total CHOL/HDL Ratio: 8.2 RATIO
Triglycerides: 519 mg/dL — ABNORMAL HIGH (ref <150)
VLDL: UNDETERMINED mg/dL (ref 0–40)

## 2019-01-22 LAB — LDL CHOLESTEROL, DIRECT: Direct LDL: 135.2 mg/dL — ABNORMAL HIGH (ref 0–99)

## 2019-01-22 MED ORDER — TORSEMIDE 20 MG PO TABS: 40.0000 mg | ORAL_TABLET | Freq: Every day | ORAL | 2 refills | Status: DC

## 2019-01-22 MED ORDER — CARVEDILOL 6.25 MG PO TABS: 9.3750 mg | ORAL_TABLET | Freq: Two times a day (BID) | ORAL | 6 refills | Status: DC

## 2019-01-22 NOTE — Patient Instructions (Signed)
Increase Carvedilol to 9.375 mg (1 & 1/2 tabs) Twice daily   Labs done today, we will notify you for abnormal results  Your provider has recommended that you have a home sleep study.  [employer] is the company that does these test.  They will contact you by phone and must speak with you before they can ship the equipment.  Once they have spoken with you they will send the equipment right to your home with instructions on how to set it up.  Once you have completed the test simply box all the equipment back up and mail back to the company.  IF you have any questions or issues with the equipment please call the company directly at [phone].  If your test is positive for sleep apnea and you need a home CPAP machine you will be contacted by Dr Theodosia Blender office [facility]) to set this up.  You have been referred to EP to discuss getting an ICD placed, their office will contact you to schedule this  Your physician recommends that you schedule a follow-up appointment in: 2 months  If you have any questions or concerns before your next appointment please send [place] a message through [place] or call our office at [phone].  At the [facility], you and your health needs are our priority. As part of our continuing mission to provide you with exceptional heart care, we have created designated Provider Care Teams. These Care Teams include your primary Cardiologist (physician) and Advanced Practice Providers (APPs- Physician Assistants and Nurse Practitioners) who all work together to provide you with the care you need, when you need it.   You may see any of the following providers on your designated Care Team at your next follow up: [name] Dr Glori Bickers . Dr Loralie Champagne . Darrick Grinder, NP . Lyda Jester, PA . Audry Riles, PharmD   Please be sure to bring in all your medications bottles to every appointment.

## 2019-01-22 NOTE — Progress Notes (Signed)
*  PRELIMINARY RESULTS* Echocardiogram 2D Echocardiogram has been performed.  Brad Mendoza 01/22/2019, 2:33 PM

## 2019-01-22 NOTE — Progress Notes (Signed)
Height:  5'9"    Weight: 202 lb BMI: 29.95  Today's Date: 01/22/2019  STOP BANG RISK ASSESSMENT S (snore) Have you been told that you snore?     YES   T (tired) Are you often tired, fatigued, or sleepy during the day?   YES  O (obstruction) Do you stop breathing, choke, or gasp during sleep? NO   P (pressure) Do you have or are you being treated for high blood pressure? YES   B (BMI) Is your body index greater than 35 kg/m? NO   A (age) Are you 62 years old or older? YES   N (neck) Do you have a neck circumference greater than 16 inches?      G (gender) Are you a male? YES   TOTAL STOP/BANG "YES" ANSWERS 5                                                                       For Office Use Only              Procedure Order Form    YES to 3+ Stop Bang questions OR two clinical symptoms - patient qualifies for WatchPAT (CPT 95800)      Clinical Notes: Will consult Sleep Specialist and refer for management of therapy due to patient increased risk of Sleep Apnea. Ordering a sleep study due to the following two clinical symptoms: Excessive daytime sleepiness G47.10 / Loud snoring R06.83   Which test do you need, WP1 or WP300???  . Do you have access to a smart device containing the app stores?  If YES, then -->WP1   If NO, then  --->WP300

## 2019-01-23 ENCOUNTER — Telehealth (HOSPITAL_COMMUNITY): Payer: Self-pay

## 2019-01-23 NOTE — Telephone Encounter (Signed)
ReFaxed ov notes, stop bang, and order to [employer] @ [phone].

## 2019-01-23 NOTE — Progress Notes (Signed)
PCP: [name], MD HF Cardiology: Dr. Aundra Dubin  63 y.o. with history of HTN, DM, CKD stage 3-4, mixed ischemic/nonischemic cardiomyopathy, and CAD presents for followup of CHF.  He has had a history of cardiomyopathy since at least 2014.  In 1/20, he had RHC/LHC with marked volume overload.  He was diuresed with IV Lasix.  He also had DES to proximal RCA and has been on ticagrelor and ASA 81 since then.  He has had poorly controlled diabetes.  Creatinine had been around 1.8-1.9 baseline, but in 10/20 had increased up to 3.36.  Echo was done today and reviewed, EF 30-35% with normal RV size and systolic function.   Main complaint is fatigue.  He falls asleep easily when sitting.  He denies significant exertional dyspnea.  He has rare central atypical chest tightness, not related to exertion.  He has been off Entresto and spironolactone due to elevated creatinine. He is followed by nephrology (Dr. Lowanda Foster).   ECG (personally reviewed): NSR, nonspecific TWF, narrow QRS, QTc 465 msec.   Labs (10/20): K 4, creatinine 3.36, BNP 105  PMH: 1. Chronic systolic CHF: Primarily nonischemic cardiomyopathy.   - RHC (1/20): mean RA 11, PA 76/30, mean PCWP 29, CI 3.1 - Echo (12/20): EF 30-35%, diffuse hypokinesis, mild LVH, normal RV size and systolic function.  2. CAD: Last LHC in 1/20 with 95% D1, 60% mLAD, 70% D2, 90% proximal and 70% mid RCA => DES to proximal RCA.  3. HTN 4. Type II diabetes 5. CKD: stage III-IV.  6. History of ETOH abuse.   Social History   Socioeconomic History  . Marital status: Legally Separated    Spouse name: Not on file  . Number of children: 2  . Years of education: 4  . Highest education level: 4th grade  Occupational History  . Occupation: disabled  Social Needs  . Financial resource strain: Not hard at all  . Food insecurity    Worry: Never true    Inability: Never true  . Transportation needs    Medical: No    Non-medical: No  Tobacco Use  . Smoking  status: Never Smoker  . Smokeless tobacco: Current User    Types: Snuff  . Tobacco comment: dips snuff about 6 cans/week for 3 years  Substance and Sexual Activity  . Alcohol use: Not Currently    Alcohol/week: 1.0 standard drinks    Types: 1 Cans of beer per week    Comment: occ - has cut down, now once every 2-3 months  . Drug use: Yes    Types: Marijuana    Comment: occasional use - smoking less  . Sexual activity: Yes    Birth control/protection: None  Lifestyle  . Physical activity    Days per week: 7 days    Minutes per session: 20 min  . Stress: Not at all  Relationships  . Social connections    Talks on phone: More than three times a week    Gets together: More than three times a week    Attends religious service: More than 4 times per year    Active member of club or organization: No    Attends meetings of clubs or organizations: Never    Relationship status: Patient refused  . Intimate partner violence    Fear of current or ex partner: No    Emotionally abused: No    Physically abused: No    Forced sexual activity: No  Other Topics Concern  . Not  on file  Social History Narrative  . Not on file   Family History  Problem Relation Age of Onset  . Hypertension Father   . Diabetes Father   . Cancer Mother   . Alcohol abuse Brother   . Colon cancer Neg Hx    ROS: All systems reviewed and negative except as per HPI.   Current Outpatient Medications  Medication Sig Dispense Refill  . aspirin EC 81 MG tablet Take 81 mg by mouth daily.    [name] BRILINTA 90 MG TABS tablet TAKE 1 TABLET BY MOUTH TWICE DAILY. 60 tablet 0  . Capsaicin (ICY HOT ARTHRITIS THERAPY EX) Apply 1 application topically 2 (two) times daily as needed (back pain).     . carvedilol (COREG) 6.25 MG tablet Take 1.5 tablets (9.375 mg total) by mouth 2 (two) times daily with a meal. 90 tablet 6  . cetirizine (ZYRTEC) 10 MG tablet Take 1 tablet (10 mg total) by mouth daily. For itching 90 tablet 3  .  Cholecalciferol (VITAMIN D3) 25 MCG (1000 UT) CAPS Take 2,000 Units by mouth daily.    . empagliflozin (JARDIANCE) 10 MG TABS tablet Take 10 mg by mouth daily. 30 tablet 6  . fluticasone (FLONASE) 50 MCG/ACT nasal spray USE 2 SPRAYS IN EACH NOSTRIL DAILY 48 g 1  . glucose blood (ONETOUCH VERIO) test strip USE FOUR TIMES DAILY AS NEEDED Dx E11.9 100 each 11  . hydrALAZINE (APRESOLINE) 100 MG tablet TAKE ONE (1) TABLET THREE (3) TIMES EACH DAY 90 tablet 2  . isosorbide mononitrate (IMDUR) 120 MG 24 hr tablet TAKE ONE (1) TABLET EACH DAY 30 tablet 2  . metFORMIN (GLUCOPHAGE-XR) 750 MG 24 hr tablet TAKE 1 TABLET EVERY MORNING WITH BREAKFAST 90 tablet 0  . metolazone (ZAROXOLYN) 5 MG tablet TAKE 1 TABLET TWICE WEEKLY OM MONDAYS & FRIDAYS 10 tablet 5  . Multiple Vitamins-Minerals (CENTRUM SILVER ADULT 50+ PO) Take 1 tablet by mouth daily.    . nabumetone (RELAFEN) 500 MG tablet TAKE TWO TABLETS BY MOUTH TWICE DAILY 360 tablet 1  . nitroGLYCERIN (NITROSTAT) 0.4 MG SL tablet DISSOLVE 1 TAB UNDER TOUNGE FOR CHEST PAIN. MAY REPEAT EVERY 5 MINUTES FOR 3 DOSES. IF NO RELIEF CALL 911 OR GO TO ER 25 tablet 2  . Omega-3 Fatty Acids (FISH OIL) 1000 MG CPDR Take 1,000 mg by mouth daily.     . pantoprazole (PROTONIX) 40 MG tablet Take 1 tablet (40 mg total) by mouth 2 (two) times daily. 180 tablet 0  . pregabalin (LYRICA) 225 MG capsule TAKE 1 CAPSULE BY MOUTH AT BEDTIME 30 capsule 0  . PROAIR HFA 108 (90 Base) MCG/ACT inhaler USE 2 PUFFS EVERY 6 HOURS AS NEEDED 8.5 g 3  . ranolazine (RANEXA) 500 MG 12 hr tablet TAKE 1 TABLET BY MOUTH TWICE DAILY. 60 tablet 0  . rosuvastatin (CRESTOR) 10 MG tablet TAKE ONE (1) TABLET EACH DAY 90 tablet 0  . terbinafine (LAMISIL) 1 % cream Apply 1 application topically 2 (two) times daily. Apply to both feet and between toes 30 g 1  . torsemide (DEMADEX) 20 MG tablet Take 2 tablets (40 mg total) by mouth daily. 90 tablet 2  . TOUJEO MAX SOLOSTAR 300 UNIT/ML SOPN INJECT 45 UNITS SQ  DAILY BEFORE BREAKFAST & 45 UNITS DAILY BEFORE SUPPER 6 mL 2   Current Facility-Administered Medications  Medication Dose Route Frequency Provider Last Rate Last Dose  . aflibercept (EYLEA) SOLN 2 mg  2 mg Intravitreal  [name], MD   2 mg at 04/25/18 0029   BP 117/88   Pulse 78   Wt 92 kg (202 lb 12.8 oz)   SpO2 97%   BMI 29.95 kg/m  General: NAD Neck: No JVD, no thyromegaly or thyroid nodule.  Lungs: Clear to auscultation bilaterally with normal respiratory effort. CV: Nondisplaced PMI.  Heart regular S1/S2, no S3/S4, no murmur.  No peripheral edema.  No carotid bruit.  Normal pedal pulses.  Abdomen: Soft, nontender, no hepatosplenomegaly, no distention.  Skin: Intact without lesions or rashes.  Neurologic: Alert and oriented x 3.  Psych: Normal affect. Extremities: No clubbing or cyanosis.  HEENT: Normal.   1. Chronic systolic CHF: Mixed nonischemic/ischemic cardiomyopathy.  Echo was done today and reviewed, EF 30-35% with diffuse hypokinesis.  He is not volume overloaded on exam.  NYHA class II symptoms.  - Increase Coreg to 9.375 mg bid.  - Continue hydralazine 100 mg tid and Imdur 120 mg daily.  - Continue torsemide 40 mg daily.   - He has been taking metolazone 5 mg on Monday and Friday.  I will check BMET today, if creatinine remains significantly above baseline, I will cut back on metolazone.  - He has had depressed EF for a prolonged period.  I will refer him to EP for ICD (he would not be a CRT candidate with narrow QRS).  - Hold off on Entresto and spironolactone for now with elevated creatinine.  2. CKD: Stage 3-4.  Creatinine up to 3.36 recently from prior baseline 1.8-1.9. He is not volume overloaded on exam.  If creatinine today remains significantly elevated on labs today, would favor cutting back on metolazone.  3. CAD: S/p DES to proximal RCA in 1/20.  He has rare atypical chest pain.  - Continue ranolazine, QTc acceptable.  - Continue ASA 81 and ticagrelor.   Ticagrelor can be stopped in 1/21.  At that time, would start him on rivaroxaban 2.5 mg bid ([facility] protocol).  - Continue statin, check lipids today.  4. OSA: Fatigue and daytime sleepiness.  - I will arrange for a home sleep study.   Followup in 2 months.   Loralie Champagne 01/23/2019

## 2019-01-24 ENCOUNTER — Telehealth (HOSPITAL_COMMUNITY): Payer: Self-pay

## 2019-01-24 MED ORDER — METOLAZONE 5 MG PO TABS: 5.0000 mg | ORAL_TABLET | ORAL | 5 refills | Status: DC

## 2019-01-24 NOTE — Telephone Encounter (Signed)
-----   Message from Larey Dresser, MD sent at 01/23/2019 11:53 PM EST ----- With creatinine still high, would have him hold metolazone for 1 week then decrease it to once weekly.

## 2019-01-24 NOTE — Telephone Encounter (Signed)
Pt advised to hold this fridays and next mondays dose. He is to restart next Friday and take each Friday after that (once weekly).  Pt verbalized understanding and was able to repeat instructions.

## 2019-01-28 ENCOUNTER — Encounter: Payer: Self-pay | Admitting: Internal Medicine

## 2019-01-28 ENCOUNTER — Other Ambulatory Visit (HOSPITAL_COMMUNITY): Payer: Self-pay | Admitting: Internal Medicine

## 2019-01-28 ENCOUNTER — Other Ambulatory Visit: Payer: Self-pay

## 2019-01-28 ENCOUNTER — Other Ambulatory Visit: Payer: Self-pay | Admitting: Family Medicine

## 2019-01-28 ENCOUNTER — Telehealth (INDEPENDENT_AMBULATORY_CARE_PROVIDER_SITE_OTHER): Payer: Medicare Other | Admitting: Internal Medicine

## 2019-01-28 VITALS — BP 117/88 | HR 78 | Ht 69.0 in | Wt 202.0 lb

## 2019-01-28 DIAGNOSIS — I428 Other cardiomyopathies: Secondary | ICD-10-CM

## 2019-01-28 DIAGNOSIS — N183 Chronic kidney disease, stage 3 unspecified: Secondary | ICD-10-CM

## 2019-01-28 DIAGNOSIS — I2510 Atherosclerotic heart disease of native coronary artery without angina pectoris: Secondary | ICD-10-CM

## 2019-01-28 DIAGNOSIS — N1831 Chronic kidney disease, stage 3a: Secondary | ICD-10-CM

## 2019-01-28 DIAGNOSIS — I5042 Chronic combined systolic (congestive) and diastolic (congestive) heart failure: Secondary | ICD-10-CM

## 2019-01-28 DIAGNOSIS — I5022 Chronic systolic (congestive) heart failure: Secondary | ICD-10-CM

## 2019-01-28 NOTE — Progress Notes (Signed)
Electrophysiology TeleHealth Note   Due to national recommendations of social distancing due to [place] 19, an audio telehealth visit is felt to be most appropriate for this patient at this time.  Verbal consent was obtained by me for the telehealth visit today.  The patient does not have capability for a virtual visit.  A phone visit is therefore required today.   Date:  01/28/2019   ID:  Brad Mendoza, DOB Jun 23, 1955, MRN [id number]  Location: patient's home  Provider location:  [facility]  Evaluation Performed: new consult  PCP:  [name], MD   Electrophysiologist:  Dr Rayann Heman  Reason for Consult: consideration of ICD   History of Present Illness:    Brad Mendoza is a 63 y.o. male who presents via telehealth conferencing today. He is referred by Dr Aundra Dubin for consideration of ICD implant. He has a mixed cardiomyopathy, CKD, CAD and persistent LV dysfunction despite guideline directed therapy.  Today, he denies symptoms of palpitations, chest pain, shortness of breath (above baseline),  lower extremity edema, dizziness, presyncope, or syncope.  The patient is otherwise without complaint today.  The patient denies symptoms of fevers, chills, cough, or new SOB worrisome for COVID 19.  Past Medical History:  Diagnosis Date  . Acid reflux   . Back pain    f4 and f5  . CAD (coronary artery disease)   . CHF (congestive heart failure) ([place])    last echo was in March 2016  . Diabetes mellitus 2005  . Diverticulosis   . H/O chest pain 2005   ECHO  . HTN (hypertension)   . Hyperlipidemia   . Nonischemic cardiomyopathy ([place])   . Onychomycosis   . Polysubstance abuse [facility])     Past Surgical History:  Procedure Laterality Date  . APPENDECTOMY    . CARDIAC CATHETERIZATION  2005   4 frech cath  . COLONOSCOPY N/A 08/01/2013   Procedure: COLONOSCOPY;  Surgeon: Rogene Houston, MD;  [facility];  Service: Endoscopy;  Laterality: N/A;  rescheduled to [place]  notified pt  . CORONARY STENT INTERVENTION N/A 03/19/2018   Procedure: CORONARY STENT INTERVENTION;  Surgeon: Leonie Man, MD;  Location: [place] CV LAB;  Service: Cardiovascular;  Laterality: N/A;  . ESOPHAGOGASTRODUODENOSCOPY N/A 04/23/2015   Procedure: ESOPHAGOGASTRODUODENOSCOPY (EGD);  Surgeon: Rogene Houston, MD;  [facility]: [facility];  Service: Endoscopy;  Laterality: N/A;  1:25  . RIGHT/LEFT HEART CATH AND CORONARY ANGIOGRAPHY N/A 06/03/2016   Procedure: Right/Left Heart Cath and Coronary Angiography;  Surgeon: Belva Crome, MD;  Location: [place] CV LAB;  Service: Cardiovascular;  Laterality: N/A;  . RIGHT/LEFT HEART CATH AND CORONARY ANGIOGRAPHY N/A 03/19/2018   Procedure: RIGHT/LEFT HEART CATH AND CORONARY ANGIOGRAPHY;  Surgeon: Jolaine Artist, MD;  Location: [place] CV LAB;  Service: Cardiovascular;  Laterality: N/A;    Current Outpatient Medications  Medication Sig Dispense Refill  . aspirin EC 81 MG tablet Take 81 mg by mouth daily.    [name] BRILINTA 90 MG TABS tablet TAKE 1 TABLET BY MOUTH TWICE DAILY. 60 tablet 0  . Capsaicin (ICY HOT ARTHRITIS THERAPY EX) Apply 1 application topically 2 (two) times daily as needed (back pain).     . carvedilol (COREG) 6.25 MG tablet Take 1.5 tablets (9.375 mg total) by mouth 2 (two) times daily with a meal. 90 tablet 6  . cetirizine (ZYRTEC) 10 MG tablet Take 1 tablet (10 mg total) by mouth daily.  For itching 90 tablet 3  . Cholecalciferol (VITAMIN D3) 25 MCG (1000 UT) CAPS Take 2,000 Units by mouth daily.    . empagliflozin (JARDIANCE) 10 MG TABS tablet Take 10 mg by mouth daily. 30 tablet 6  . fluticasone (FLONASE) 50 MCG/ACT nasal spray USE 2 SPRAYS IN EACH NOSTRIL DAILY 48 g 1  . glucose blood (ONETOUCH VERIO) test strip USE FOUR TIMES DAILY AS NEEDED Dx E11.9 100 each 11  . hydrALAZINE (APRESOLINE) 100 MG tablet TAKE ONE (1) TABLET THREE (3) TIMES EACH DAY 90 tablet 2  . isosorbide mononitrate (IMDUR) 120 MG 24 hr tablet  TAKE ONE (1) TABLET EACH DAY 30 tablet 2  . metFORMIN (GLUCOPHAGE-XR) 750 MG 24 hr tablet TAKE 1 TABLET EVERY MORNING WITH BREAKFAST 90 tablet 0  . Multiple Vitamins-Minerals (CENTRUM SILVER ADULT 50+ PO) Take 1 tablet by mouth daily.    . nabumetone (RELAFEN) 500 MG tablet TAKE TWO TABLETS BY MOUTH TWICE DAILY 360 tablet 1  . nitroGLYCERIN (NITROSTAT) 0.4 MG SL tablet DISSOLVE 1 TAB UNDER TOUNGE FOR CHEST PAIN. MAY REPEAT EVERY 5 MINUTES FOR 3 DOSES. IF NO RELIEF CALL 911 OR GO TO ER 25 tablet 2  . Omega-3 Fatty Acids (FISH OIL) 1000 MG CPDR Take 1,000 mg by mouth daily.     . pantoprazole (PROTONIX) 40 MG tablet Take 1 tablet (40 mg total) by mouth 2 (two) times daily. 180 tablet 0  . pregabalin (LYRICA) 225 MG capsule TAKE 1 CAPSULE BY MOUTH AT BEDTIME 30 capsule 0  . PROAIR HFA 108 (90 Base) MCG/ACT inhaler USE 2 PUFFS EVERY 6 HOURS AS NEEDED 8.5 g 3  . ranolazine (RANEXA) 500 MG 12 hr tablet TAKE 1 TABLET BY MOUTH TWICE DAILY. 60 tablet 0  . rosuvastatin (CRESTOR) 10 MG tablet TAKE ONE (1) TABLET EACH DAY 90 tablet 0  . terbinafine (LAMISIL) 1 % cream Apply 1 application topically 2 (two) times daily. Apply to both feet and between toes 30 g 1  . torsemide (DEMADEX) 20 MG tablet Take 2 tablets (40 mg total) by mouth daily. 90 tablet 2  . TOUJEO MAX SOLOSTAR 300 UNIT/ML SOPN INJECT 45 UNITS SQ DAILY BEFORE BREAKFAST & 45 UNITS DAILY BEFORE SUPPER 6 mL 2   Current Facility-Administered Medications  Medication Dose Route Frequency Provider Last Rate Last Dose  . aflibercept (EYLEA) SOLN 2 mg  2 mg Intravitreal  [name], MD   2 mg at 04/25/18 0029    Allergies:   Sulfa antibiotics and Penicillins   Social History:  The patient  reports that he has never smoked. His smokeless tobacco use includes snuff. He reports previous alcohol use of about 1.0 standard drinks of alcohol per week. He reports current drug use. Drug: Marijuana.   Family History:  The patient's family history includes  Alcohol abuse in his brother; Cancer in his mother; Diabetes in his father; Hypertension in his father.   ROS:  Please see the history of present illness.   All other systems are personally reviewed and negative.    Exam:    Vital Signs:  BP 117/88   Pulse 78   Ht 5\' 9"  (1.753 m)   Wt 202 lb (91.6 kg)   SpO2 97%   BMI 29.83 kg/m   Well sounding, alert and conversant, regular work of breathing   Labs/Other Tests and Data Reviewed:    Recent Labs: 03/26/2018: Magnesium 2.2 11/22/2018: ALT 24; Hemoglobin 14.6; Platelets 281 12/20/2018: B Natriuretic Peptide 105.2  01/22/2019: BUN 67; Creatinine, Ser 3.11; Potassium 4.3; Sodium 136   Wt Readings from Last 3 Encounters:  01/28/19 202 lb (91.6 kg)  01/22/19 202 lb 12.8 oz (92 kg)  12/20/18 210 lb 6.4 oz (95.4 kg)     ASSESSMENT & PLAN:    1.  Chronic systolic heart failure The patient has an mixed CM (EF 30%), NYHA Class III CHF, and CAD.  He is referred by Dr Aundra Dubin for risk stratification of sudden death and consideration of ICD implantation.  At this time, he meets MADIT II/ SCD-HeFT criteria for ICD implantation for primary prevention of sudden death.  I have had a thorough discussion with the patient reviewing options.  The patient and their family (if available) have had opportunities to ask questions and have them answered. The patient and I have decided together through a shared decision making process to proceed with ICD at this time.   Risks, benefits, alternatives to ICD implantation were discussed in detail with the patient today. The patient understands that the risks include but are not limited to bleeding, infection, pneumothorax, perforation, tamponade, vascular damage, renal failure, MI, stroke, death, inappropriate shocks, and lead dislodgement and wishes to proceed.  We will therefore schedule device implantation at the next available time. He has a narrow QRS and would not be expected to benefit from CRT. Could consider  Baroreceptor therapy with CVRx device  Will need to discuss with Dr Aundra Dubin holding [place] prior to procedure.    2. CKD, stage III  Followed by Dr Nestor Ramp No current HD access  3.  CAD No recent ischemic symptoms Continue current therapy   Follow-up:  With me after ICD implant   Patient Risk:  after full review of this patients clinical status, I feel that they are at moderate risk at this time.  Today, I have spent 15 minutes with the patient with telehealth technology discussing arrhythmia management .    Army Fossa, MD  01/28/2019 10:48 AM     [facility] [address] [place] [address] [phone] (office) [phone] (fax)

## 2019-01-28 NOTE — Patient Instructions (Signed)
Medication Instructions:  Dr. Rayann Heman will let you know if it is okay to stop Brilinta.. continue to take until further information. We will call you.  *If you need a refill on your cardiac medications before your next appointment, please call your pharmacy*  Lab Work: Will plan after we schedule your ICD implant.  If you have labs (blood work) drawn today and your tests are completely normal, you will receive your results only by: [name] MyChart Message (if you have MyChart) OR . A paper copy in the mail If you have any lab test that is abnormal or we need to change your treatment, we will call you to review the results.  Testing/Procedures: WE WILL CALL YOU IN THE NEXT SEVERAL WEEKS FOR YOUR PROCEDURE TO BE PLANNED IN January 2021  Your physician has recommended that you have a defibrillator inserted. An implantable cardioverter defibrillator (ICD) is a small device that is placed in your chest or, in rare cases, your abdomen, or in your side. This device uses electrical pulses or shocks to help control life-threatening, irregular heartbeats that could lead the heart to suddenly stop beating (sudden cardiac arrest). Leads are attached to the ICD that goes into your heart. This is done in the hospital and usually requires an overnight stay.   Follow-Up: After your ICD implant.  At [facility], you and your health needs are our priority.  As part of our continuing mission to provide you with exceptional heart care, we have created designated Provider Care Teams.  These Care Teams include your primary Cardiologist (physician) and Advanced Practice Providers (APPs -  Physician Assistants and Nurse Practitioners) who all work together to provide you with the care you need, when you need it.

## 2019-01-29 ENCOUNTER — Telehealth: Payer: Self-pay

## 2019-01-29 NOTE — Telephone Encounter (Signed)
-----   Message from Thompson Grayer, MD sent at 01/28/2019 10:54 AM EST ----- Schedule for ICD implantation after the first of the year (not urgent)  Brad Mendoza,  can we stop brilinta 1 week prior to the procedure and keep him off of it as he will be a year post PCI in January?

## 2019-01-29 NOTE — Telephone Encounter (Signed)
Thompson Grayer, MD sent to Damian Leavell, RN; Stephani Police, RN        Stop brilinta 1 week prior to the procedure as per Dr Aundra Dubin

## 2019-01-31 ENCOUNTER — Encounter (INDEPENDENT_AMBULATORY_CARE_PROVIDER_SITE_OTHER): Payer: Medicare Other | Admitting: Ophthalmology

## 2019-02-04 ENCOUNTER — Encounter (HOSPITAL_COMMUNITY): Payer: Self-pay

## 2019-02-04 ENCOUNTER — Ambulatory Visit (INDEPENDENT_AMBULATORY_CARE_PROVIDER_SITE_OTHER): Payer: Medicare Other | Admitting: Family Medicine

## 2019-02-04 ENCOUNTER — Telehealth: Payer: Self-pay

## 2019-02-04 DIAGNOSIS — R109 Unspecified abdominal pain: Secondary | ICD-10-CM

## 2019-02-04 DIAGNOSIS — R7989 Other specified abnormal findings of blood chemistry: Secondary | ICD-10-CM | POA: Diagnosis not present

## 2019-02-04 DIAGNOSIS — R10A0 Flank pain, unspecified side: Secondary | ICD-10-CM

## 2019-02-04 DIAGNOSIS — E7800 Pure hypercholesterolemia, unspecified: Secondary | ICD-10-CM

## 2019-02-04 MED ORDER — ICOSAPENT ETHYL 1 G PO CAPS: 2.0000 g | ORAL_CAPSULE | Freq: Two times a day (BID) | ORAL | 1 refills | Status: AC

## 2019-02-04 MED ORDER — CARVEDILOL 6.25 MG PO TABS: 6.2500 mg | ORAL_TABLET | Freq: Two times a day (BID) | ORAL | 1 refills | Status: DC

## 2019-02-04 MED ORDER — ROSUVASTATIN CALCIUM 20 MG PO TABS: 20.0000 mg | ORAL_TABLET | Freq: Every day | ORAL | 1 refills | Status: DC

## 2019-02-04 NOTE — Progress Notes (Signed)
Telephone visit  Subjective: CC: flank pain PCP: [name], MD Brad Mendoza is a 63 y.o. male calls for telephone consult today. Patient provides verbal consent for consult held via phone.  Due to COVID-19 pandemic this visit was conducted virtually. This visit type was conducted due to national recommendations for restrictions regarding the COVID-19 Pandemic (e.g. social distancing, sheltering in place) in an effort to limit this patient's exposure and mitigate transmission in our community. All issues noted in this document were discussed and addressed.  A physical exam was not performed with this format.   Location of patient: home Location of provider: Working remotely from home Others present for call: spouse  1.  Flank pain Patient reports several day history of bilateral flank pain that seems most prominent at nighttime.  He has intermittent nausea associated with the flank pain.  He reports good urine output and if anything he sometimes has dribbling after urination.  No hematuria, fevers, vomiting.  He was established with Dr. Lowanda Foster but notes that he has been trying to get in touch with his office several times now and was unsuccessful in scheduling an appointment.  He was told by his cardiologist that his renal function was worsening and this worried him.   ROS: Per HPI  Allergies  Allergen Reactions  . Sulfa Antibiotics Shortness Of Breath  . Penicillins Rash and Other (See Comments)    Has patient had a PCN reaction causing immediate rash, facial/tongue/throat swelling, SOB or lightheadedness with hypotension: No Has patient had a PCN reaction causing severe rash involving mucus membranes or skin necrosis: No Has patient had a PCN reaction that required hospitalization No Has patient had a PCN reaction occurring within the last 10 years: No If all of the above answers are "NO", then may proceed with Cephalosporin use.    Past Medical History:  Diagnosis Date   . Acid reflux   . Back pain    f4 and f5  . CAD (coronary artery disease)   . CHF (congestive heart failure) ([place])    last echo was in March 2016  . Diabetes mellitus 2005  . Diverticulosis   . H/O chest pain 2005   ECHO  . HTN (hypertension)   . Hyperlipidemia   . Nonischemic cardiomyopathy ([place])   . Onychomycosis   . Polysubstance abuse ([place])     Current Outpatient Medications:  .  aspirin EC 81 MG tablet, Take 81 mg by mouth daily., Disp: , Rfl:  .  BRILINTA 90 MG TABS tablet, TAKE 1 TABLET BY MOUTH TWICE DAILY., Disp: 60 tablet, Rfl: 0 .  Capsaicin (ICY HOT ARTHRITIS THERAPY EX), Apply 1 application topically 2 (two) times daily as needed (back pain). , Disp: , Rfl:  .  carvedilol (COREG) 6.25 MG tablet, Take 1.5 tablets (9.375 mg total) by mouth 2 (two) times daily with a meal., Disp: 90 tablet, Rfl: 6 .  cetirizine (ZYRTEC) 10 MG tablet, Take 1 tablet (10 mg total) by mouth daily. For itching, Disp: 90 tablet, Rfl: 3 .  Cholecalciferol (VITAMIN D3) 25 MCG (1000 UT) CAPS, Take 2,000 Units by mouth daily., Disp: , Rfl:  .  empagliflozin (JARDIANCE) 10 MG TABS tablet, Take 10 mg by mouth daily., Disp: 30 tablet, Rfl: 6 .  fluticasone (FLONASE) 50 MCG/ACT nasal spray, USE 2 SPRAYS IN EACH NOSTRIL DAILY, Disp: 48 g, Rfl: 1 .  glucose blood (ONETOUCH VERIO) test strip, USE FOUR TIMES DAILY AS NEEDED Dx E11.9, Disp: 100 each, Rfl:  11 .  hydrALAZINE (APRESOLINE) 100 MG tablet, TAKE ONE (1) TABLET THREE (3) TIMES EACH DAY, Disp: 90 tablet, Rfl: 2 .  isosorbide mononitrate (IMDUR) 120 MG 24 hr tablet, TAKE ONE (1) TABLET EACH DAY, Disp: 30 tablet, Rfl: 2 .  metFORMIN (GLUCOPHAGE-XR) 750 MG 24 hr tablet, TAKE 1 TABLET EVERY MORNING WITH BREAKFAST, Disp: 90 tablet, Rfl: 0 .  Multiple Vitamins-Minerals (CENTRUM SILVER ADULT 50+ PO), Take 1 tablet by mouth daily., Disp: , Rfl:  .  nabumetone (RELAFEN) 500 MG tablet, TAKE TWO TABLETS BY MOUTH TWICE DAILY, Disp: 360 tablet, Rfl: 1 .   nitroGLYCERIN (NITROSTAT) 0.4 MG SL tablet, DISSOLVE 1 TAB UNDER TOUNGE FOR CHEST PAIN. MAY REPEAT EVERY 5 MINUTES FOR 3 DOSES. IF NO RELIEF CALL 911 OR GO TO ER, Disp: 25 tablet, Rfl: 2 .  Omega-3 Fatty Acids (FISH OIL) 1000 MG CPDR, Take 1,000 mg by mouth daily. , Disp: , Rfl:  .  pantoprazole (PROTONIX) 40 MG tablet, Take 1 tablet (40 mg total) by mouth 2 (two) times daily., Disp: 180 tablet, Rfl: 0 .  pregabalin (LYRICA) 225 MG capsule, TAKE 1 CAPSULE BY MOUTH AT BEDTIME, Disp: 30 capsule, Rfl: 5 .  PROAIR HFA 108 (90 Base) MCG/ACT inhaler, USE 2 PUFFS EVERY 6 HOURS AS NEEDED, Disp: 8.5 g, Rfl: 3 .  ranolazine (RANEXA) 500 MG 12 hr tablet, TAKE 1 TABLET BY MOUTH TWICE DAILY., Disp: 60 tablet, Rfl: 0 .  rosuvastatin (CRESTOR) 10 MG tablet, TAKE ONE (1) TABLET EACH DAY, Disp: 90 tablet, Rfl: 0 .  terbinafine (LAMISIL) 1 % cream, Apply 1 application topically 2 (two) times daily. Apply to both feet and between toes, Disp: 30 g, Rfl: 1 .  torsemide (DEMADEX) 20 MG tablet, Take 2 tablets (40 mg total) by mouth daily., Disp: 90 tablet, Rfl: 2 .  TOUJEO MAX SOLOSTAR 300 UNIT/ML SOPN, INJECT 45 UNITS SQ DAILY BEFORE BREAKFAST & 45 UNITS DAILY BEFORE SUPPER, Disp: 6 mL, Rfl: 2  Current Facility-Administered Medications:  .  aflibercept (EYLEA) SOLN 2 mg, 2 mg, Intravitreal, , [name], MD, 2 mg at 04/25/18 0029  Assessment/ Plan: 63 y.o. male   1. Flank pain We will rule out pyelonephritis.  However, flank pain may be related to underlying medical renal disease versus kidney stone versus MSK.  Patient will come in tomorrow to leave urine. - urinalysis- dip and micro; Future - Urine culture; Future  2. Elevated serum creatinine I reviewed his last several serum creatinines.  He did have a large jump from his check in July to October.  This seems to have plateaued at this point at 3.11.  Given intermittent nausea, I worry that this is related to underlying medical renal disease and have  recommended that he follow-up with [facility].  I have also reached out to Dr. [name] to see if perhaps he can coordinate care.  The patient was given the telephone number to [facility] and will try and make an appointment as soon as possible.  We discussed red flag signs and symptoms warranting further evaluation emergency department.  Patient voiced good understanding.  He is to hydrate.  I recommend discontinuation of the Metformin given drop in GFR.  I do not see where he has had a refill since July so patient may not be taking but is still on med list.  At this point he will likely need to rely only on insulins given advanced CKD.  Probably not a good candidate  for Jardiance any longer as well.  We will recheck renal function.  If GFR has dropped below 20 he certainly needs to come off the [place].  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  Patient likelt needs to come off several medications. I will also CC his PCP on this to follow-up on patient's needs. - Basic Metabolic Panel; Future - CBC; Future  Start time: 2:55pm End time: 3:03pm  Total time spent on patient care (including telephone call/ virtual visit): 15 minutes  [place], [place] [phone]

## 2019-02-04 NOTE — Telephone Encounter (Signed)
Patient advised and verbalized understanding. Med list updated to reflect changes.  ? ?

## 2019-02-04 NOTE — Addendum Note (Signed)
Addended by: Shela Nevin R on: 54/88/3014 04:29 PM   Modules accepted: Orders

## 2019-02-04 NOTE — Telephone Encounter (Signed)
Can go back to Coreg 6.25 mg bid.

## 2019-02-04 NOTE — Telephone Encounter (Signed)
Call placed to Pt.  Advised would call to schedule his procedure in January.  Pt indicates understanding.

## 2019-02-04 NOTE — Telephone Encounter (Signed)
Call received from Pt.  Per Pt since increasing his carvedilol he has been dry heaving and throwing up.  Advised I would send his message to HF office for follow up.

## 2019-02-05 ENCOUNTER — Other Ambulatory Visit: Payer: Self-pay

## 2019-02-05 ENCOUNTER — Ambulatory Visit (INDEPENDENT_AMBULATORY_CARE_PROVIDER_SITE_OTHER): Payer: Medicare Other

## 2019-02-05 DIAGNOSIS — R109 Unspecified abdominal pain: Secondary | ICD-10-CM

## 2019-02-05 DIAGNOSIS — R7989 Other specified abnormal findings of blood chemistry: Secondary | ICD-10-CM | POA: Diagnosis not present

## 2019-02-05 DIAGNOSIS — Z23 Encounter for immunization: Secondary | ICD-10-CM | POA: Diagnosis not present

## 2019-02-05 DIAGNOSIS — Z79899 Other long term (current) drug therapy: Secondary | ICD-10-CM | POA: Diagnosis not present

## 2019-02-05 DIAGNOSIS — D631 Anemia in chronic kidney disease: Secondary | ICD-10-CM | POA: Diagnosis not present

## 2019-02-05 DIAGNOSIS — R809 Proteinuria, unspecified: Secondary | ICD-10-CM | POA: Diagnosis not present

## 2019-02-05 DIAGNOSIS — E559 Vitamin D deficiency, unspecified: Secondary | ICD-10-CM | POA: Diagnosis not present

## 2019-02-05 DIAGNOSIS — N184 Chronic kidney disease, stage 4 (severe): Secondary | ICD-10-CM | POA: Diagnosis not present

## 2019-02-05 LAB — MICROSCOPIC EXAMINATION
Bacteria, UA: NONE SEEN
Epithelial Cells (non renal): NONE SEEN /hpf (ref 0–10)
Renal Epithel, UA: NONE SEEN /hpf

## 2019-02-05 LAB — URINALYSIS, COMPLETE
Bilirubin, UA: NEGATIVE
Glucose, UA: NEGATIVE
Ketones, UA: NEGATIVE
Leukocytes,UA: NEGATIVE
Nitrite, UA: NEGATIVE
Specific Gravity, UA: 1.02 (ref 1.005–1.030)
Urobilinogen, Ur: 0.2 mg/dL (ref 0.2–1.0)
pH, UA: 6 (ref 5.0–7.5)

## 2019-02-06 LAB — CBC
Hematocrit: 39.5 % (ref 37.5–51.0)
Hemoglobin: 13.4 g/dL (ref 13.0–17.7)
MCH: 27.9 pg (ref 26.6–33.0)
MCHC: 33.9 g/dL (ref 31.5–35.7)
MCV: 82 fL (ref 79–97)
Platelets: 244 10*3/uL (ref 150–450)
RBC: 4.81 x10E6/uL (ref 4.14–5.80)
RDW: 13.7 % (ref 11.6–15.4)
WBC: 7 10*3/uL (ref 3.4–10.8)

## 2019-02-06 LAB — BASIC METABOLIC PANEL
BUN/Creatinine Ratio: 21 (ref 10–24)
BUN: 78 mg/dL (ref 8–27)
CO2: 23 mmol/L (ref 20–29)
Calcium: 9.3 mg/dL (ref 8.6–10.2)
Chloride: 91 mmol/L — ABNORMAL LOW (ref 96–106)
Creatinine, Ser: 3.68 mg/dL (ref 0.76–1.27)
GFR calc Af Amer: 19 mL/min/{1.73_m2} — ABNORMAL LOW (ref >59)
GFR calc non Af Amer: 17 mL/min/{1.73_m2} — ABNORMAL LOW (ref >59)
Glucose: 225 mg/dL — ABNORMAL HIGH (ref 65–99)
Potassium: 3.8 mmol/L (ref 3.5–5.2)
Sodium: 132 mmol/L — ABNORMAL LOW (ref 134–144)

## 2019-02-07 ENCOUNTER — Ambulatory Visit: Payer: Medicare Other

## 2019-02-07 ENCOUNTER — Encounter: Payer: Self-pay | Admitting: *Deleted

## 2019-02-07 ENCOUNTER — Other Ambulatory Visit: Payer: Self-pay | Admitting: *Deleted

## 2019-02-07 LAB — URINE CULTURE

## 2019-02-07 NOTE — Patient Outreach (Signed)
[facility]) Care Management  [place]  02/07/2019   Brad Mendoza [birth year] [id number]   [place] Monthly Outreach  Referral Date:  12/14/2016 Referral Source:  Transfer from [place] Reason for Referral:  Continued Disease Management Education Insurance:  [employer]   Outreach Attempt:  Successful telephone outreach to patient for introduction and follow up.  HIPAA verified with patient.  Patient reporting he is doing well.  Stating he is awaiting surgery for his ICD in the new year, still needing to be scheduled.  Has not weighed today.  States he weighs himself about 3 times a week.  Weighed himself while on telephone and weight was 196.2 pounds (in his normal range).  Discussed importance of daily weight monitoring and when to call provider based on weight.  Denies any shortness of breath and denies any swelling in hands, legs/feet, or abdomen.  Reports he does monitor his blood sugars and they have been running high.  Fasting blood sugar this morning was >300.   Last Hgb A1C was 13.1 on 07/27/2018.  Discussed with patient what Hgb A1C measured.  Patient stating compliance with his medications and knows he needs to do better with his food choices.  Discussed and educated patient on the importance of glycemic control especially when planning surgery.  Discussed dangers of nonhealing incisions and wound infections related to uncontrolled blood sugars.  Reinforced with patient to continue taking his Brilinta until instructed to stop by his provider placing ICD.  Encounter Medications:  Outpatient Encounter Medications as of 02/07/2019  Medication Sig  . aspirin EC 81 MG tablet Take 81 mg by mouth daily.  [name] BRILINTA 90 MG TABS tablet TAKE 1 TABLET BY MOUTH TWICE DAILY.  . Capsaicin (ICY HOT ARTHRITIS THERAPY EX) Apply 1 application topically 2 (two) times daily as needed (back pain).   . carvedilol (COREG) 6.25 MG tablet Take  1 tablet (6.25 mg total) by mouth 2 (two) times daily with a meal.  . cetirizine (ZYRTEC) 10 MG tablet Take 1 tablet (10 mg total) by mouth daily. For itching  . Cholecalciferol (VITAMIN D3) 25 MCG (1000 UT) CAPS Take 2,000 Units by mouth daily.  . empagliflozin (JARDIANCE) 10 MG TABS tablet Take 10 mg by mouth daily.  . fluticasone (FLONASE) 50 MCG/ACT nasal spray USE 2 SPRAYS IN EACH NOSTRIL DAILY  . hydrALAZINE (APRESOLINE) 100 MG tablet TAKE ONE (1) TABLET THREE (3) TIMES EACH DAY  . icosapent Ethyl (VASCEPA) 1 g capsule Take 2 capsules (2 g total) by mouth 2 (two) times daily.  . isosorbide mononitrate (IMDUR) 120 MG 24 hr tablet TAKE ONE (1) TABLET EACH DAY  . metFORMIN (GLUCOPHAGE-XR) 750 MG 24 hr tablet TAKE 1 TABLET EVERY MORNING WITH BREAKFAST  . Multiple Vitamins-Minerals (CENTRUM SILVER ADULT 50+ PO) Take 1 tablet by mouth daily.  . nabumetone (RELAFEN) 500 MG tablet TAKE TWO TABLETS BY MOUTH TWICE DAILY  . nitroGLYCERIN (NITROSTAT) 0.4 MG SL tablet DISSOLVE 1 TAB UNDER TOUNGE FOR CHEST PAIN. MAY REPEAT EVERY 5 MINUTES FOR 3 DOSES. IF NO RELIEF CALL 911 OR GO TO ER  . Omega-3 Fatty Acids (FISH OIL) 1000 MG CPDR Take 1,000 mg by mouth daily.   . pantoprazole (PROTONIX) 40 MG tablet Take 1 tablet (40 mg total) by mouth 2 (two) times daily.  . pregabalin (LYRICA) 225 MG capsule TAKE 1 CAPSULE BY MOUTH AT BEDTIME  . PROAIR HFA 108 (90 Base) MCG/ACT inhaler USE 2  PUFFS EVERY 6 HOURS AS NEEDED  . ranolazine (RANEXA) 500 MG 12 hr tablet TAKE 1 TABLET BY MOUTH TWICE DAILY.  . rosuvastatin (CRESTOR) 20 MG tablet Take 1 tablet (20 mg total) by mouth daily.  [name] torsemide (DEMADEX) 20 MG tablet Take 2 tablets (40 mg total) by mouth daily.  . TOUJEO MAX SOLOSTAR 300 UNIT/ML SOPN INJECT 45 UNITS SQ DAILY BEFORE BREAKFAST & 45 UNITS DAILY BEFORE SUPPER  . glucose blood (ONETOUCH VERIO) test strip USE FOUR TIMES DAILY AS NEEDED Dx E11.9  . terbinafine (LAMISIL) 1 % cream Apply 1 application topically  2 (two) times daily. Apply to both feet and between toes   Facility-Administered Encounter Medications as of 02/07/2019  Medication  . aflibercept (EYLEA) SOLN 2 mg    Functional Status:  In your present state of health, do you have any difficulty performing the following activities: 02/07/2019 09/19/2018  Hearing? N N  Vision? Y N  Comment monthly eye injections pt had eye exam last year sometime but he can't remember what month  Difficulty concentrating or making decisions? N N  Walking or climbing stairs? N Y  Comment - due to knee pain  Dressing or bathing? N N  Doing errands, shopping? N N  Preparing Food and eating ? N N  Using the Toilet? N N  In the past six months, have you accidently leaked urine? N N  Do you have problems with loss of bowel control? N N  Managing your Medications? N Y  Comment - he gets his medications in a blister pack from pharmacy  Managing your Finances? N N  Housekeeping or managing your Housekeeping? N N  Some recent data might be hidden    Fall/Depression Screening: Fall Risk  02/07/2019 12/14/2018 12/14/2018  Falls in the past year? 0 0 0  Comment - - -  Number falls in past yr: - - -  Injury with Fall? - - -  Comment - - -  Risk for fall due to : Medication side effect;Impaired vision - -  Follow up Falls evaluation completed;Education provided;Falls prevention discussed - -   PHQ 2/9 Scores 09/19/2018 07/27/2018 04/10/2018 01/25/2018 12/14/2016 12/02/2016 09/26/2016  PHQ - 2 Score 0 0 0 0 1 0 0   [facility] CM Care Plan Problem One     Most Recent Value  Care Plan Problem One  knowledge deficiet related to self care management of diabetes and congestive heart failure  Role Documenting the Problem One  [facility] [place] for Problem One  Active  [facility] Long Term Goal   Patient will report decreasing hgb A1C by 1 point in the next 90 days.  [facility] Long Term Goal Start Date  02/07/19  [facility] Long Term Goal Met Date  02/07/19  Interventions for Problem  One Long Term Goal  Care plan and goals reviewed and discussed with patient, reviewed lastest Hgb A1C and it's elevation, reviewed meaning of Hgb A1C and reviewed current glucose levels, discussed and encouraged ways to help reduce blood sugars, discussed and encouraged healthier food and drink options, educated patient on need for glycemic control prior to surgery, discussed dangers of hyperglycemia and risk of infection/nonhealing after surgery, reviewed medications and encouraged medication compliance, encouraged patient to keep and attend scheduled medical appointments, offered to send diabetic diet/meal planning education to patient (patient declining at this time)  [facility] CM Short Term Goal #1   Patient will report weighing himself daily within the next 30 days.  [facility] CM Short Term Goal #1 Start Date  02/07/19  Interventions for Short Term Goal #1  Discussed importance of daily weight monitoring, reviewed and discussed signs and symptoms of heart failure, reviewed and discussed when to call physician based on weights, encouraged patient to weigh himself daily and keep record of weights     Appointments:   Patient attended appointment with primary care office on 02/04/2019.  Attended appointment with Electrophysiologist on 01/28/2019.  Has appointment with Nephrologist on 02/12/2019.  Plan: RN Health Coach will send primary care quarterly update. RN Health Coach will reach out to [place] at [facility] to see if patient qualifies for Chronic Care Management. RN Health Coach will make next telephone outreach to patient within the month of January and patient agrees to future outreach.  [place] [phone] [email or web address]

## 2019-02-08 ENCOUNTER — Encounter (INDEPENDENT_AMBULATORY_CARE_PROVIDER_SITE_OTHER): Payer: Medicare Other | Admitting: Ophthalmology

## 2019-02-08 DIAGNOSIS — H3581 Retinal edema: Secondary | ICD-10-CM

## 2019-02-08 DIAGNOSIS — E113313 Type 2 diabetes mellitus with moderate nonproliferative diabetic retinopathy with macular edema, bilateral: Secondary | ICD-10-CM

## 2019-02-08 DIAGNOSIS — I10 Essential (primary) hypertension: Secondary | ICD-10-CM

## 2019-02-08 DIAGNOSIS — H25813 Combined forms of age-related cataract, bilateral: Secondary | ICD-10-CM

## 2019-02-08 DIAGNOSIS — H35033 Hypertensive retinopathy, bilateral: Secondary | ICD-10-CM

## 2019-02-11 DIAGNOSIS — N184 Chronic kidney disease, stage 4 (severe): Secondary | ICD-10-CM | POA: Insufficient documentation

## 2019-02-12 ENCOUNTER — Other Ambulatory Visit: Payer: Self-pay | Admitting: Family Medicine

## 2019-02-12 ENCOUNTER — Telehealth: Payer: Self-pay | Admitting: *Deleted

## 2019-02-12 DIAGNOSIS — N189 Chronic kidney disease, unspecified: Secondary | ICD-10-CM | POA: Diagnosis not present

## 2019-02-12 DIAGNOSIS — I129 Hypertensive chronic kidney disease with stage 1 through stage 4 chronic kidney disease, or unspecified chronic kidney disease: Secondary | ICD-10-CM | POA: Diagnosis not present

## 2019-02-12 DIAGNOSIS — R809 Proteinuria, unspecified: Secondary | ICD-10-CM | POA: Diagnosis not present

## 2019-02-12 DIAGNOSIS — E118 Type 2 diabetes mellitus with unspecified complications: Secondary | ICD-10-CM

## 2019-02-12 DIAGNOSIS — E211 Secondary hyperparathyroidism, not elsewhere classified: Secondary | ICD-10-CM | POA: Diagnosis not present

## 2019-02-12 DIAGNOSIS — E559 Vitamin D deficiency, unspecified: Secondary | ICD-10-CM | POA: Diagnosis not present

## 2019-02-12 MED ORDER — TOUJEO MAX SOLOSTAR 300 UNIT/ML ~~LOC~~ SOPN: 55.0000 [IU] | PEN_INJECTOR | Freq: Two times a day (BID) | SUBCUTANEOUS | 2 refills | Status: DC

## 2019-02-12 NOTE — Telephone Encounter (Signed)
Per instructions from pcp:  Patient instructed to stop jardiance and metformin.  Toujeo should be increased to 55 units BID. [facility] will remove medicines from future pill packs .  Patient will return current pill packs to have them redone by pharmacist.

## 2019-02-13 NOTE — Telephone Encounter (Signed)
Spoke with pt re: scheduling ICD implant procedure.  Pt states he saw his nephrologist yesterday and his kidney disease is advancing.  Pt has concerns re: procedure and worsening kidney function.  Pt states he would like to speak with dr Rayann Heman re: his concerns prior to scheduling procedure.  Virtual visit scheduled for 02/25/2019.  Pt verbalizes understanding and agrees with plan.

## 2019-02-13 NOTE — Telephone Encounter (Signed)
Perfect! Thanks, Narda Rutherford! WS

## 2019-02-16 ENCOUNTER — Other Ambulatory Visit: Payer: Self-pay

## 2019-02-16 ENCOUNTER — Emergency Department (HOSPITAL_COMMUNITY)
Admission: EM | Admit: 2019-02-16 | Discharge: 2019-02-16 | Disposition: A | Discharge status: Home / Self Care | Payer: Medicare Other | Attending: Emergency Medicine | Admitting: Emergency Medicine

## 2019-02-16 ENCOUNTER — Emergency Department (HOSPITAL_COMMUNITY): Payer: Medicare Other

## 2019-02-16 ENCOUNTER — Encounter (HOSPITAL_COMMUNITY): Payer: Self-pay | Admitting: Emergency Medicine

## 2019-02-16 DIAGNOSIS — I5042 Chronic combined systolic (congestive) and diastolic (congestive) heart failure: Secondary | ICD-10-CM | POA: Insufficient documentation

## 2019-02-16 DIAGNOSIS — I25118 Atherosclerotic heart disease of native coronary artery with other forms of angina pectoris: Secondary | ICD-10-CM | POA: Insufficient documentation

## 2019-02-16 DIAGNOSIS — Z79899 Other long term (current) drug therapy: Secondary | ICD-10-CM | POA: Diagnosis not present

## 2019-02-16 DIAGNOSIS — R1032 Left lower quadrant pain: Secondary | ICD-10-CM | POA: Diagnosis not present

## 2019-02-16 DIAGNOSIS — Z7982 Long term (current) use of aspirin: Secondary | ICD-10-CM | POA: Insufficient documentation

## 2019-02-16 DIAGNOSIS — E1122 Type 2 diabetes mellitus with diabetic chronic kidney disease: Secondary | ICD-10-CM | POA: Insufficient documentation

## 2019-02-16 DIAGNOSIS — I130 Hypertensive heart and chronic kidney disease with heart failure and stage 1 through stage 4 chronic kidney disease, or unspecified chronic kidney disease: Secondary | ICD-10-CM | POA: Diagnosis not present

## 2019-02-16 DIAGNOSIS — R112 Nausea with vomiting, unspecified: Secondary | ICD-10-CM | POA: Diagnosis present

## 2019-02-16 DIAGNOSIS — F17290 Nicotine dependence, other tobacco product, uncomplicated: Secondary | ICD-10-CM | POA: Diagnosis not present

## 2019-02-16 DIAGNOSIS — N184 Chronic kidney disease, stage 4 (severe): Secondary | ICD-10-CM | POA: Diagnosis not present

## 2019-02-16 DIAGNOSIS — K5730 Diverticulosis of large intestine without perforation or abscess without bleeding: Secondary | ICD-10-CM | POA: Diagnosis not present

## 2019-02-16 DIAGNOSIS — U071 COVID-19: Secondary | ICD-10-CM | POA: Insufficient documentation

## 2019-02-16 DIAGNOSIS — E11319 Type 2 diabetes mellitus with unspecified diabetic retinopathy without macular edema: Secondary | ICD-10-CM | POA: Insufficient documentation

## 2019-02-16 DIAGNOSIS — Z20822 Contact with and (suspected) exposure to covid-19: Secondary | ICD-10-CM

## 2019-02-16 DIAGNOSIS — D649 Anemia, unspecified: Secondary | ICD-10-CM | POA: Insufficient documentation

## 2019-02-16 DIAGNOSIS — E1142 Type 2 diabetes mellitus with diabetic polyneuropathy: Secondary | ICD-10-CM | POA: Insufficient documentation

## 2019-02-16 DIAGNOSIS — Z794 Long term (current) use of insulin: Secondary | ICD-10-CM | POA: Diagnosis not present

## 2019-02-16 DIAGNOSIS — I517 Cardiomegaly: Secondary | ICD-10-CM | POA: Diagnosis not present

## 2019-02-16 LAB — COMPREHENSIVE METABOLIC PANEL
ALT: 33 U/L (ref 0–44)
AST: 35 U/L (ref 15–41)
Albumin: 3.2 g/dL — ABNORMAL LOW (ref 3.5–5.0)
Alkaline Phosphatase: 76 U/L (ref 38–126)
Anion gap: 8 (ref 5–15)
BUN: 55 mg/dL — ABNORMAL HIGH (ref 8–23)
CO2: 24 mmol/L (ref 22–32)
Calcium: 8.6 mg/dL — ABNORMAL LOW (ref 8.9–10.3)
Chloride: 105 mmol/L (ref 98–111)
Creatinine, Ser: 2.9 mg/dL — ABNORMAL HIGH (ref 0.61–1.24)
GFR calc Af Amer: 26 mL/min — ABNORMAL LOW (ref >60)
GFR calc non Af Amer: 22 mL/min — ABNORMAL LOW (ref >60)
Glucose, Bld: 245 mg/dL — ABNORMAL HIGH (ref 70–99)
Potassium: 4 mmol/L (ref 3.5–5.1)
Sodium: 137 mmol/L (ref 135–145)
Total Bilirubin: 0.8 mg/dL (ref 0.3–1.2)
Total Protein: 6.3 g/dL — ABNORMAL LOW (ref 6.5–8.1)

## 2019-02-16 LAB — CBC
HCT: 34.3 % — ABNORMAL LOW (ref 39.0–52.0)
Hemoglobin: 11.1 g/dL — ABNORMAL LOW (ref 13.0–17.0)
MCH: 28.1 pg (ref 26.0–34.0)
MCHC: 32.4 g/dL (ref 30.0–36.0)
MCV: 86.8 fL (ref 80.0–100.0)
Platelets: 210 10*3/uL (ref 150–400)
RBC: 3.95 MIL/uL — ABNORMAL LOW (ref 4.22–5.81)
RDW: 13.3 % (ref 11.5–15.5)
WBC: 6.5 10*3/uL (ref 4.0–10.5)
nRBC: 0 % (ref 0.0–0.2)

## 2019-02-16 LAB — URINALYSIS, ROUTINE W REFLEX MICROSCOPIC
Bilirubin Urine: NEGATIVE
Glucose, UA: >=500 mg/dL — AB
Ketones, ur: NEGATIVE mg/dL
Leukocytes,Ua: NEGATIVE
Nitrite: NEGATIVE
Protein, ur: >=300 mg/dL — AB
Specific Gravity, Urine: 1.013 (ref 1.005–1.030)
pH: 5 (ref 5.0–8.0)

## 2019-02-16 LAB — LIPASE, BLOOD: Lipase: 27 U/L (ref 11–51)

## 2019-02-16 MED ORDER — SODIUM CHLORIDE 0.9% FLUSH
3.0000 mL | Freq: Once | INTRAVENOUS | Status: AC
  Administered 2019-02-16: 3 mL via INTRAVENOUS

## 2019-02-16 NOTE — ED Notes (Signed)
PA in to assess 

## 2019-02-16 NOTE — ED Notes (Signed)
From CT 

## 2019-02-16 NOTE — ED Notes (Signed)
PA in to discuss findings

## 2019-02-16 NOTE — ED Notes (Signed)
Rad to room

## 2019-02-16 NOTE — ED Notes (Addendum)
Urine to lab    To CT

## 2019-02-16 NOTE — ED Provider Notes (Signed)
[facility] EMERGENCY DEPARTMENT Provider Note   CSN: [id number] Arrival date & time: 02/16/19  1125     History Chief Complaint  Patient presents with  . Nausea  . Emesis  . Sore Throat    Brad Mendoza is a 63 y.o. male with a past medical history significant for CAD, CHF, CKD stage 4, diabetes mellitus, diverticulosis, hypertension, hyperlipidemia, nonischemic cardiomyopathy, and polysubstance abuse who presents to the ED due to nausea, vomiting, and sore throat that occurred 2 to 3 weeks ago which has completely resolved.  Patient is worried because his wife and mother-in-law tested positive for Covid last night.  Patient admits to intermittent headache for the past few days that is located on the top of his head.  He has tried Tylenol with moderate relief.  Patient denies shortness of breath and chest pain.  Patient notes he felt nauseous today but denies episodes of emesis. Patient denies fever and chills. Patient admits to left sided abdominal pain. Patient is a very poor historian. Patient continuously notes he is very nervous about COVID infection.   Past Medical History:  Diagnosis Date  . Acid reflux   . Back pain    f4 and f5  . CAD (coronary artery disease)   . CHF (congestive heart failure) ([place])    last echo was in March 2016  . Diabetes mellitus 2005  . Diverticulosis   . H/O chest pain 2005   ECHO  . HTN (hypertension)   . Hyperlipidemia   . Nonischemic cardiomyopathy ([place])   . Onychomycosis   . Polysubstance abuse [facility])     Patient Active Problem List   Diagnosis Date Noted  . Acute on chronic systolic (congestive) heart failure ([place]) 03/19/2018  . 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 Onychomycosis 05/23/2017  . Acid reflux 05/23/2017  . Cardiomyopathy ([place]) 05/23/2017  . Anginal pain ([place]) 06/01/2016  . Angina pectoris ([place]) 06/01/2016  . CHF exacerbation ([place]) 11/17/2015  . Congestive heart failure ([place]) 11/17/2015  . Chronic combined systolic and diastolic  heart failure ([place]) 11/16/2015  . Type 2 diabetes mellitus with hyperosmolar nonketotic hyperglycemia ([place]) 05/08/2015  . Hyponatremia 05/08/2015  . Hypochloremia 05/08/2015  . Type 2 diabetes mellitus with other specified complication ([place]) 53/97/6734  . Type 2 diabetes mellitus with diabetic polyneuropathy ([place]) 12/16/2013  . Type 2 diabetes mellitus ([place]) 12/16/2013  . Coronary artery disease involving native coronary artery of native heart with angina pectoris ([place]) 09/18/2013  . Coronary arteriosclerosis 09/18/2013  . Heme positive stool 06/12/2013  . Occult blood in stools 06/12/2013  . Seasonal allergic rhinitis 07/11/2012  . Polysubstance abuse ([place]) 12/24/2010  . Nonischemic cardiomyopathy ([place])   . Diabetic retinopathy of both eyes associated with type 2 diabetes mellitus ([place])   . Hyperlipemia 11/16/2009  . Obesity 11/16/2009  . Tobacco dependence 11/16/2009  . Essential hypertension 11/16/2009  . Hyperlipidemia 11/16/2009  . Tobacco dependence syndrome 11/16/2009    Past Surgical History:  Procedure Laterality Date  . APPENDECTOMY    . CARDIAC CATHETERIZATION  2005   4 frech cath  . COLONOSCOPY N/A 08/01/2013   Procedure: COLONOSCOPY;  Surgeon: Rogene Houston, MD;  Location[facility];  Service: Endoscopy;  Laterality: N/A;  rescheduled to [place] notified pt  . CORONARY STENT INTERVENTION N/A 03/19/2018   Procedure: CORONARY STENT INTERVENTION;  Surgeon: Leonie Man, MD;  Location: [place] CV LAB;  Service: Cardiovascular;  Laterality: N/A;  . ESOPHAGOGASTRODUODENOSCOPY N/A 04/23/2015   Procedure: ESOPHAGOGASTRODUODENOSCOPY (EGD);  Surgeon: Rogene Houston, MD;  Location: [facility];  Service: Endoscopy;  Laterality: N/A;  1:25  . RIGHT/LEFT HEART CATH AND CORONARY ANGIOGRAPHY N/A 06/03/2016   Procedure: Right/Left Heart Cath and Coronary Angiography;  Surgeon: Belva Crome, MD;  Location: [place] CV LAB;  Service: Cardiovascular;  Laterality: N/A;    . RIGHT/LEFT HEART CATH AND CORONARY ANGIOGRAPHY N/A 03/19/2018   Procedure: RIGHT/LEFT HEART CATH AND CORONARY ANGIOGRAPHY;  Surgeon: Jolaine Artist, MD;  Location: [place] CV LAB;  Service: Cardiovascular;  Laterality: N/A;       Family History  Problem Relation Age of Onset  . Hypertension Father   . Diabetes Father   . Cancer Mother   . Alcohol abuse Brother   . Colon cancer Neg Hx     Social History   Tobacco Use  . Smoking status: Never Smoker  . Smokeless tobacco: Current User    Types: Snuff  . Tobacco comment: dips snuff about 6 cans/week for 3 years  Substance Use Topics  . Alcohol use: Not Currently    Alcohol/week: 1.0 standard drinks    Types: 1 Cans of beer per week    Comment: occ - has cut down, now once every 2-3 months  . Drug use: Yes    Types: Marijuana    Comment: occasional use - smoking less    Home Medications Prior to Admission medications   Medication Sig Start Date End Date Taking? Authorizing Provider  aspirin EC 81 MG tablet Take 81 mg by mouth daily.    [provider]  BRILINTA 90 MG TABS tablet TAKE 1 TABLET BY MOUTH TWICE DAILY. 01/28/19   Bensimhon, Shaune Pascal, MD  Capsaicin (ICY HOT ARTHRITIS THERAPY EX) Apply 1 application topically 2 (two) times daily as needed (back pain).     [provider]  carvedilol (COREG) 6.25 MG tablet Take 1 tablet (6.25 mg total) by mouth 2 (two) times daily with a meal. 02/04/19   Larey Dresser, MD  cetirizine (ZYRTEC) 10 MG tablet Take 1 tablet (10 mg total) by mouth daily. For itching 09/04/18   [name], MD  Cholecalciferol (VITAMIN D3) 25 MCG (1000 UT) CAPS Take 2,000 Units by mouth daily.    [provider]  fluticasone (FLONASE) 50 MCG/ACT nasal spray USE 2 SPRAYS IN [facility] NOSTRIL DAILY 09/18/18   Terald Sleeper, PA-C  glucose blood (ONETOUCH VERIO) test strip USE FOUR TIMES DAILY AS NEEDED Dx E11.9 09/20/18   [name], MD  hydrALAZINE (APRESOLINE) 100 MG  tablet TAKE ONE (1) TABLET THREE (3) TIMES EACH DAY 09/18/18   [name], MD  icosapent Ethyl (VASCEPA) 1 g capsule Take 2 capsules (2 g total) by mouth 2 (two) times daily. 02/04/19   Larey Dresser, MD  Insulin Glargine, 2 Unit Dial, (TOUJEO MAX SOLOSTAR) 300 UNIT/ML SOPN Inject 55 Units into the skin 2 (two) times daily. 02/12/19   [name], MD  isosorbide mononitrate (IMDUR) 120 MG 24 hr tablet TAKE ONE (1) TABLET EACH DAY 09/18/18   [name], MD  Multiple Vitamins-Minerals (CENTRUM SILVER ADULT 50+ PO) Take 1 tablet by mouth daily.    [provider]  nabumetone (RELAFEN) 500 MG tablet TAKE TWO TABLETS BY MOUTH TWICE DAILY 09/18/18   [name], MD  nitroGLYCERIN (NITROSTAT) 0.4 MG SL tablet DISSOLVE 1 TAB UNDER TOUNGE FOR CHEST PAIN. MAY REPEAT EVERY 5 MINUTES FOR 3 DOSES. IF NO RELIEF CALL 911 OR GO TO ER  09/20/18   [name], MD  Omega-3 Fatty Acids (FISH OIL) 1000 MG CPDR Take 1,000 mg by mouth daily.     [provider]  pantoprazole (PROTONIX) 40 MG tablet Take 1 tablet (40 mg total) by mouth 2 (two) times daily. 11/21/18   [name], MD  pregabalin (LYRICA) 225 MG capsule TAKE 1 CAPSULE BY MOUTH AT BEDTIME 01/28/19   [name], MD  PROAIR HFA 108 [phone] Base) MCG/ACT inhaler USE 2 PUFFS EVERY 6 HOURS AS NEEDED 09/18/18   [name], MD  ranolazine (RANEXA) 500 MG 12 hr tablet TAKE 1 TABLET BY MOUTH TWICE DAILY. 01/28/19   Bensimhon, Shaune Pascal, MD  rosuvastatin (CRESTOR) 20 MG tablet Take 1 tablet (20 mg total) by mouth daily. 02/04/19   Larey Dresser, MD  terbinafine (LAMISIL) 1 % cream Apply 1 application topically 2 (two) times daily. Apply to both feet and between toes 04/10/18   [name], MD  torsemide (DEMADEX) 20 MG tablet Take 2 tablets (40 mg total) by mouth daily. 01/22/19   Larey Dresser, MD    Allergies    Sulfa antibiotics and Penicillins  Review of Systems   Review of Systems  Constitutional: Negative for chills  and fever.  HENT: Positive for sore throat (resolved).   Respiratory: Negative for shortness of breath.   Cardiovascular: Negative for chest pain.  Gastrointestinal: Positive for abdominal pain (left sided abdominal pain), diarrhea (resolved), nausea and vomiting (resolved).  Neurological: Positive for headaches.    Physical Exam Updated Vital Signs BP (!) 151/83 (BP Location: Right Arm)   Pulse 88   Temp 98 F (36.7 C) (Oral)   Resp 20   Ht 5\' 9"  (1.753 m)   Wt 93 kg   SpO2 100%   BMI 30.27 kg/m   Physical Exam Vitals reviewed.  Constitutional:      General: He is not in acute distress.    Appearance: He is not toxic-appearing.  HENT:     Head: Normocephalic.     Mouth/Throat:     Mouth: Mucous membranes are moist.     Pharynx: No oropharyngeal exudate or posterior oropharyngeal erythema.  Eyes:     Pupils: Pupils are equal, round, and reactive to light.  Cardiovascular:     Rate and Rhythm: Normal rate and regular rhythm.     Pulses: Normal pulses.     Heart sounds: Normal heart sounds. No murmur. No friction rub. No gallop.   Pulmonary:     Effort: Pulmonary effort is normal.     Breath sounds: Normal breath sounds.     Comments: Respirations equal and unlabored, patient able to speak in full sentences, lungs clear to auscultation bilaterally Abdominal:     General: Abdomen is flat. Bowel sounds are normal. There is no distension.     Palpations: Abdomen is soft.     Tenderness: There is abdominal tenderness. There is guarding. There is no rebound.     Comments: Tenderness to palpation in RUQ, epigastric region, and LLQ with voluntary guarding. No rebound. Negative tenderness at Mcburney's point.  Musculoskeletal:     Cervical back: Neck supple.     Right lower leg: Edema present.     Left lower leg: Edema present.     Comments: 1+ pitting edema bilaterally  Skin:    General: Skin is warm.  Neurological:     General: No focal deficit present.     Mental  Status: He is alert.  Psychiatric:  Comments: Anxious about possible COVID infection     ED Results / Procedures / Treatments   Labs (all labs ordered are listed, but only abnormal results are displayed) Labs Reviewed  COMPREHENSIVE METABOLIC PANEL - Abnormal; Notable for the following components:      Result Value   Glucose, Bld 245 (*)    BUN 55 (*)    Creatinine, Ser 2.90 (*)    Calcium 8.6 (*)    Total Protein 6.3 (*)    Albumin 3.2 (*)    GFR calc non Af Amer 22 (*)    GFR calc Af Amer 26 (*)    All other components within normal limits  CBC - Abnormal; Notable for the following components:   RBC 3.95 (*)    Hemoglobin 11.1 (*)    HCT 34.3 (*)    All other components within normal limits  URINALYSIS, ROUTINE W REFLEX MICROSCOPIC - Abnormal; Notable for the following components:   Glucose, UA >=500 (*)    Hgb urine dipstick SMALL (*)    Protein, ur >=300 (*)    Bacteria, UA RARE (*)    All other components within normal limits  NOVEL CORONAVIRUS, NAA (HOSP ORDER, SEND-OUT TO REF LAB; TAT 18-24 HRS)  LIPASE, BLOOD    EKG None  Radiology CT ABDOMEN PELVIS WO CONTRAST  Result Date: 02/16/2019 CLINICAL DATA:  63 year old male with abdominal pain. Concern for acute diverticulitis EXAM: CT ABDOMEN AND PELVIS WITHOUT CONTRAST TECHNIQUE: Multidetector CT imaging of the abdomen and pelvis was performed following the standard protocol without IV contrast. COMPARISON:  Abdominal ultrasound dated 11/28/2018 and CT of the abdomen pelvis dated 11/18/2011. FINDINGS: Evaluation of this exam is limited in the absence of intravenous contrast. Lower chest: The visualized lung bases are clear. No intra-abdominal free air or free fluid. Hepatobiliary: No focal liver abnormality is seen. No gallstones, gallbladder wall thickening, or biliary dilatation. Pancreas: Unremarkable. No pancreatic ductal dilatation or surrounding inflammatory changes. Spleen: Normal in size without focal  abnormality. Adrenals/Urinary Tract: The adrenal glands are unremarkable. There is no hydronephrosis or nephrolithiasis on either side. The visualized ureters appear unremarkable. The urinary bladder is collapsed. Stomach/Bowel: There is colonic diverticulosis without active inflammatory changes. There is no bowel obstruction or active inflammation. Appendectomy. Vascular/Lymphatic: Mild aortoiliac atherosclerotic disease. The IVC is unremarkable. No portal venous gas. There is no adenopathy. Reproductive: The prostate and seminal vesicles are grossly unremarkable. Other: None Musculoskeletal: No acute or significant osseous findings. IMPRESSION: 1. No acute intra-abdominal or pelvic pathology. No bowel obstruction or active inflammation. 2. Colonic diverticulosis. 3. Aortic Atherosclerosis (ICD10-I70.0). Electronically Signed   By: Anner Crete M.D.   On: 02/16/2019 22:52   DG Chest Portable 1 View  Result Date: 02/16/2019 CLINICAL DATA:  63 year old presenting with a 2 week history of nausea, vomiting and sore throat. Family members have tested positive for COVID-19. EXAM: PORTABLE CHEST 1 VIEW COMPARISON:  08/25/2018 and earlier. FINDINGS: Cardiac silhouette moderately enlarged, unchanged. Lungs clear. Bronchovascular markings normal. Pulmonary vascularity normal. No visible pleural effusions. No pneumothorax. IMPRESSION: Stable cardiomegaly. No acute cardiopulmonary disease. Electronically Signed   By: Evangeline Dakin M.D.   On: 02/16/2019 19:53    Procedures Procedures (including critical care time)  Medications Ordered in ED Medications  sodium chloride flush (NS) 0.9 % injection 3 mL (3 mLs Intravenous Given 02/16/19 2152)    ED Course  I have reviewed the triage vital signs and the nursing notes.  Pertinent labs & imaging results that were  available during my care of the patient were reviewed by me and considered in my medical decision making (see chart for details).    MDM  Rules/Calculators/A&P                     63 year old male presents to the ED due to concerns about COVID-19 infection after his wife and mother-in-law tested positive last night.  Patient also admits to left lower quadrant abdominal pain.  Patient has a history of diverticulosis.  Vitals all within normal limits except elevated BP at 151/83.  Patient in no acute distress and non-ill appearing.  Abdomen soft, nondistended with tenderness to palpation of the right upper quadrant, epigastric and left lower quadrant regions with voluntary guarding.  No peritoneal signs.  Given patient is a poor historian with tenderness to palpation in multiple regions of abdomen will obtain CT scan to rule out diverticulitis, pancreatitis and other intra-abdominal etiologies.  Will obtain routine labs, UA, and outpatient PCR Covid test.  UA significant for glucosuria and proteinuria likely due to to known diabetes and CKD.  No signs of infection.  CMP with hyperglycemia at 245 (chronically elevated), BUN at 55 and creatinine at 2.9 which appears better than baseline.  Lipase normal at 27.  Doubt pancreatitis at this time.  Chest x-ray personally reviewed which is significant for stable cardiomegaly without signs of infection.  CT scan personally reviewed which is negative for acute intra-abdominal etiologies but significant for colonic diverticulosis. COVID PCR test pending. Advised patient to continue to self quarantine until results become available. Advised patient to follow-up with PCP in regards to his hemoglobin and abdominal pain. Patient denies hematochezia and melena. No concern for GI bleed at this time. Low hemoglobin could be due to CKD. Strict ED precautions discussed with patient. Patient states understanding and agrees to plan. Patient discharged home in no acute distress and stable vitals    Brad Mendoza was evaluated in Emergency Department on 02/16/2019 for the symptoms described in the history of present  illness. He was evaluated in the context of the global COVID-19 pandemic, which necessitated consideration that the patient might be at risk for infection with the SARS-CoV-2 virus that causes COVID-19. Institutional protocols and algorithms that pertain to the evaluation of patients at risk for COVID-19 are in a state of rapid change based on information released by regulatory bodies including the CDC and federal and state organizations. These policies and algorithms were followed during the patient's care in the ED.  Final Clinical Impression(s) / ED Diagnoses Final diagnoses:  Suspected COVID-19 virus infection  LLQ abdominal pain  Low hemoglobin    Rx / DC Orders ED Discharge Orders    None       Karie Kirks 02/16/19 2309    Margette Fast, MD 02/17/19 1300

## 2019-02-16 NOTE — Discharge Instructions (Addendum)
As discussed, your labs and images are reassuring. Your COVID test is still pending, so continue to self quarantine until you get your results. Your hemoglobin was a little lower than it normally is. Follow-up with your PCP in regards to your hemoglobin level. Also, follow-up with your PCP in regards to abdominal pain, but your CT scan was normal today. Return to the ER for new or worsening symptoms.

## 2019-02-16 NOTE — ED Notes (Signed)
Pt reports he was sick several weeks ago  Family members tested positive for covid  He reports he called his PCP who advised to come here   He is advised of CDC guidelines that urge treat symptomatically,  Isolate for 10-14 days Mask and distance as well as hand washing   No V today, small stool after "eating 2 boiled eggs"  Covid collected

## 2019-02-16 NOTE — ED Triage Notes (Signed)
Patient states that his family that he lives with tested positive for COVID last night. He complains of nausea, vomiting, and sore throat x 2 weeks. Denies fever.

## 2019-02-18 LAB — NOVEL CORONAVIRUS, NAA (HOSP ORDER, SEND-OUT TO REF LAB; TAT 18-24 HRS): SARS-CoV-2, NAA: DETECTED — AB

## 2019-02-19 ENCOUNTER — Telehealth: Payer: Self-pay | Admitting: Nurse Practitioner

## 2019-02-19 NOTE — Telephone Encounter (Signed)
Called to Discuss with patient about Covid symptoms and the use of bamlanivimab, a monoclonal antibody infusion for those with mild to moderate Covid symptoms and at a high risk of hospitalization.     Patient states symptoms started over 10 days ago and therefore does not qualify for infusion.

## 2019-02-20 ENCOUNTER — Other Ambulatory Visit: Payer: Self-pay | Admitting: Family Medicine

## 2019-02-20 ENCOUNTER — Telehealth: Payer: Self-pay | Admitting: Family Medicine

## 2019-02-20 DIAGNOSIS — R05 Cough: Secondary | ICD-10-CM

## 2019-02-20 DIAGNOSIS — R059 Cough, unspecified: Secondary | ICD-10-CM

## 2019-02-20 MED ORDER — BENZONATATE 100 MG PO CAPS: 100.0000 mg | ORAL_CAPSULE | Freq: Three times a day (TID) | ORAL | 0 refills | Status: DC | PRN

## 2019-02-20 NOTE — Telephone Encounter (Signed)
Patient aware and verbalized understanding. °

## 2019-02-20 NOTE — Telephone Encounter (Signed)
What is the name of the medication? Cough med  Have you contacted your pharmacy to request a refill?no  Which pharmacy would you like this sent to? [facility]   Patient notified that their request is being sent to the clinical staff for review and that they should receive a call once it is complete. If they do not receive a call within 24 hours they can check with their pharmacy or our office.   Was tested positive for covid

## 2019-02-20 NOTE — Telephone Encounter (Signed)
Tessalon sent to pharmacy

## 2019-02-25 ENCOUNTER — Encounter: Payer: Self-pay | Admitting: Internal Medicine

## 2019-02-25 ENCOUNTER — Telehealth (INDEPENDENT_AMBULATORY_CARE_PROVIDER_SITE_OTHER): Payer: Medicare Other | Admitting: Internal Medicine

## 2019-02-25 VITALS — Ht 69.0 in | Wt 205.0 lb

## 2019-02-25 DIAGNOSIS — I5042 Chronic combined systolic (congestive) and diastolic (congestive) heart failure: Secondary | ICD-10-CM | POA: Diagnosis not present

## 2019-02-25 DIAGNOSIS — I428 Other cardiomyopathies: Secondary | ICD-10-CM

## 2019-02-25 NOTE — Progress Notes (Signed)
Electrophysiology TeleHealth Note  Due to national recommendations of social distancing due to [place] 19, an audio telehealth visit is felt to be most appropriate for this patient at this time.  Verbal consent was obtained by me for the telehealth visit today.  The patient does not have capability for a virtual visit.  A phone visit is therefore required today.   Date:  02/25/2019   ID:  Michae Mendoza, DOB 11/01/1955, MRN [id number]  Location: patient's home  Provider location:  [facility]  Evaluation Performed: Follow-up visit  PCP:  [name], MD   Electrophysiologist:  Dr Rayann Heman  Chief Complaint:  CHF  History of Present Illness:    Brad Mendoza is a 64 y.o. male who presents via telehealth conferencing today. I have been asked to discuss ICD implant with him again today.  I saw him 01/28/2019 (note reviewed) and scheduled ICD.  He has since had clinical decline, primarily due to renal failure.  She has advanced renal failure and is followed by Dr Malka So.  He has been advised to proceed with AV fistula placement in anticipation of upcoming dialysis.  In addition, he was diagnosed with COVID 19 02/16/2019.  He has SOB but is doing "OK".  He is more SOB at night.  He was evaluated and is felt to not be a candidate for the monoclonal antibody infusion.   Today, he denies symptoms of palpitations, chest pain,  dizziness, presyncope, or syncope.  The patient is otherwise without complaint today.    Past Medical History:  Diagnosis Date  . Acid reflux   . Back pain    f4 and f5  . CAD (coronary artery disease)   . CHF (congestive heart failure) ([place])    last echo was in March 2016  . Diabetes mellitus 2005  . Diverticulosis   . H/O chest pain 2005   ECHO  . HTN (hypertension)   . Hyperlipidemia   . Nonischemic cardiomyopathy ([place])   . Onychomycosis   . Polysubstance abuse [facility])     Past Surgical History:  Procedure Laterality Date  . APPENDECTOMY    .  CARDIAC CATHETERIZATION  2005   4 frech cath  . COLONOSCOPY N/A 08/01/2013   Procedure: COLONOSCOPY;  Surgeon: Rogene Houston, MD;  [facility];  Service: Endoscopy;  Laterality: N/A;  rescheduled to [place] notified pt  . CORONARY STENT INTERVENTION N/A 03/19/2018   Procedure: CORONARY STENT INTERVENTION;  Surgeon: Leonie Man, MD;  Location: [place] CV LAB;  Service: Cardiovascular;  Laterality: N/A;  . ESOPHAGOGASTRODUODENOSCOPY N/A 04/23/2015   Procedure: ESOPHAGOGASTRODUODENOSCOPY (EGD);  Surgeon: Rogene Houston, MD;  [facility];  Service: Endoscopy;  Laterality: N/A;  1:25  . RIGHT/LEFT HEART CATH AND CORONARY ANGIOGRAPHY N/A 06/03/2016   Procedure: Right/Left Heart Cath and Coronary Angiography;  Surgeon: Belva Crome, MD;  Location: [place] CV LAB;  Service: Cardiovascular;  Laterality: N/A;  . RIGHT/LEFT HEART CATH AND CORONARY ANGIOGRAPHY N/A 03/19/2018   Procedure: RIGHT/LEFT HEART CATH AND CORONARY ANGIOGRAPHY;  Surgeon: Jolaine Artist, MD;  Location: [place] CV LAB;  Service: Cardiovascular;  Laterality: N/A;    Current Outpatient Medications  Medication Sig Dispense Refill  . aspirin EC 81 MG tablet Take 81 mg by mouth daily.    . benzonatate (TESSALON PERLES) 100 MG capsule Take 1 capsule (100 mg total) by mouth 3 (three) times daily as needed for cough. 20 capsule 0  .  BRILINTA 90 MG TABS tablet TAKE 1 TABLET BY MOUTH TWICE DAILY. 60 tablet 0  . Capsaicin (ICY HOT ARTHRITIS THERAPY EX) Apply 1 application topically 2 (two) times daily as needed (back pain).     . carvedilol (COREG) 6.25 MG tablet Take 1 tablet (6.25 mg total) by mouth 2 (two) times daily with a meal. 180 tablet 1  . cetirizine (ZYRTEC) 10 MG tablet Take 1 tablet (10 mg total) by mouth daily. For itching 90 tablet 3  . Cholecalciferol (VITAMIN D3) 25 MCG (1000 UT) CAPS Take 2,000 Units by mouth daily.    . fluticasone (FLONASE) 50 MCG/ACT nasal spray USE 2 SPRAYS IN  EACH NOSTRIL DAILY 48 g 1  . glucose blood (ONETOUCH VERIO) test strip USE FOUR TIMES DAILY AS NEEDED Dx E11.9 100 each 11  . hydrALAZINE (APRESOLINE) 100 MG tablet TAKE ONE (1) TABLET THREE (3) TIMES EACH DAY 90 tablet 2  . icosapent Ethyl (VASCEPA) 1 g capsule Take 2 capsules (2 g total) by mouth 2 (two) times daily. 360 capsule 1  . Insulin Glargine, 2 Unit Dial, (TOUJEO MAX SOLOSTAR) 300 UNIT/ML SOPN Inject 55 Units into the skin 2 (two) times daily. 33 mL 2  . isosorbide mononitrate (IMDUR) 120 MG 24 hr tablet TAKE ONE (1) TABLET EACH DAY 30 tablet 2  . Multiple Vitamins-Minerals (CENTRUM SILVER ADULT 50+ PO) Take 1 tablet by mouth daily.    . nabumetone (RELAFEN) 500 MG tablet TAKE TWO TABLETS BY MOUTH TWICE DAILY 360 tablet 1  . nitroGLYCERIN (NITROSTAT) 0.4 MG SL tablet DISSOLVE 1 TAB UNDER TOUNGE FOR CHEST PAIN. MAY REPEAT EVERY 5 MINUTES FOR 3 DOSES. IF NO RELIEF CALL 911 OR GO TO ER 25 tablet 2  . Omega-3 Fatty Acids (FISH OIL) 1000 MG CPDR Take 1,000 mg by mouth daily.     . pantoprazole (PROTONIX) 40 MG tablet Take 1 tablet (40 mg total) by mouth 2 (two) times daily. 180 tablet 0  . pregabalin (LYRICA) 225 MG capsule TAKE 1 CAPSULE BY MOUTH AT BEDTIME 30 capsule 5  . PROAIR HFA 108 (90 Base) MCG/ACT inhaler USE 2 PUFFS EVERY 6 HOURS AS NEEDED 8.5 g 3  . ranolazine (RANEXA) 500 MG 12 hr tablet TAKE 1 TABLET BY MOUTH TWICE DAILY. 60 tablet 0  . rosuvastatin (CRESTOR) 20 MG tablet Take 1 tablet (20 mg total) by mouth daily. 90 tablet 1  . terbinafine (LAMISIL) 1 % cream Apply 1 application topically 2 (two) times daily. Apply to both feet and between toes 30 g 1  . torsemide (DEMADEX) 20 MG tablet Take 2 tablets (40 mg total) by mouth daily. 90 tablet 2   Current Facility-Administered Medications  Medication Dose Route Frequency Provider Last Rate Last Admin  . aflibercept (EYLEA) SOLN 2 mg  2 mg Intravitreal  [name], MD   2 mg at 04/25/18 0029    Allergies:   Sulfa  antibiotics and Penicillins   Social History:  The patient  reports that he has never smoked. His smokeless tobacco use includes snuff. He reports previous alcohol use of about 1.0 standard drinks of alcohol per week. He reports current drug use. Drug: Marijuana.   Family History:  The patient's  family history includes Alcohol abuse in his brother; Cancer in his mother; Diabetes in his father; Hypertension in his father.   ROS:  Please see the history of present illness.   All other systems are personally reviewed and negative.    Exam:  Vital Signs:  Ht 5\' 9"  (1.753 m)   Wt 205 lb (93 kg)   BMI 30.27 kg/m   Well sounding, alert and conversant, not tachypneic   Labs/Other Tests and Data Reviewed:    Recent Labs: 03/26/2018: Magnesium 2.2 12/20/2018: B Natriuretic Peptide 105.2 02/16/2019: ALT 33; BUN 55; Creatinine, Ser 2.90; Hemoglobin 11.1; Platelets 210; Potassium 4.0; Sodium 137   Wt Readings from Last 3 Encounters:  02/25/19 205 lb (93 kg)  02/16/19 205 lb (93 kg)  01/28/19 202 lb (91.6 kg)       ASSESSMENT & PLAN:    1.  Chronic systolic dysfunction He has a mixed CM with EF 30% and NYHA Class III CHF symptoms.  He has a narrow QRS and does not meet criteria for CRT.  Does not have a pacing indication. Given recent worsening of chronic renal failure, I think it would be best to consider subQ ICD rather than transvenous.  We discussed this today and he would be willing to have further discussions.  I will therefore refer to Drs Caryl Comes or Lovena Le.  As the patient is COVID +, I would advise that we wait 4-6 weeks for that visit. May eventually be a good candidate for barostim stimulation therapy  2. COVID + Guarded condition He has been evaluated and felt to not be a candidate for bamlanivimab  [name] note reviewed) He has been considered for   3. Stage IV renal failure Followed by nephrology HD access has been discussed  4. CAD No ischemic  symptoms  Follow-up:  Dr Caryl Comes or Lovena Le in 4-6 weeks to discuss SubQ ICD I will see as needed   Patient Risk:  after full review of this patients clinical status, I feel that they are at moderate risk at this time.  Today, I have spent 15 minutes with the patient with telehealth technology discussing arrhythmia management .    Army Fossa, MD  02/25/2019 3:23 PM     [place] [address] [phone] (office) [phone] (fax)

## 2019-02-26 ENCOUNTER — Telehealth: Payer: Self-pay

## 2019-02-26 NOTE — Telephone Encounter (Signed)
Virtual Visit Pre-Appointment Phone Call  "(Name), I am calling you today to discuss your upcoming appointment. We are currently trying to limit exposure to the virus that causes COVID-19 by seeing patients at home rather than in the office."  "What is the BEST phone number to call the day of the visit?" - include this in appointment notes  "Do you have or have access to (through a family member/friend) a smartphone with video capability that we can use for your visit?" If yes - list this number in appt notes as "cell" (if different from BEST phone #) and list the appointment type as a VIDEO visit in appointment notes If no - list the appointment type as a PHONE visit in appointment notes  Confirm consent - "In the setting of the current Covid19 crisis, you are scheduled for a (phone or video) visit with your provider on (date) at (time).  Just as we do with many in-office visits, in order for you to participate in this visit, we must obtain consent.  If you'd like, I can send this to your mychart (if signed up) or email for you to review.  Otherwise, I can obtain your verbal consent now.  All virtual visits are billed to your insurance company just like a normal visit would be.  By agreeing to a virtual visit, we'd like you to understand that the technology does not allow for your provider to perform an examination, and thus may limit your provider's ability to fully assess your condition. If your provider identifies any concerns that need to be evaluated in person, we will make arrangements to do so.  Finally, though the technology is pretty good, we cannot assure that it will always work on either your or our end, and in the setting of a video visit, we may have to convert it to a phone-only visit.  In either situation, we cannot ensure that we have a secure connection.  Are you willing to proceed?" STAFF: Did the patient verbally acknowledge consent to telehealth visit? Document YES/NO here:    Advise patient to be prepared - "Two hours prior to your appointment, go ahead and check your blood pressure, pulse, oxygen saturation, and your weight (if you have the equipment to check those) and write them all down. When your visit starts, your provider will ask you for this information. If you have an Apple Watch or Kardia device, please plan to have heart rate information ready on the day of your appointment. Please have a pen and paper handy nearby the day of the visit as well."  Give patient instructions for MyChart download to smartphone OR [email or web address] as below if video visit (depending on what platform provider is using)  Inform patient they will receive a phone call 15 minutes prior to their appointment time (may be from unknown caller ID) so they should be prepared to answer    [place] has been deemed a candidate for a follow-up tele-health visit to limit community exposure during the Covid-19 pandemic. I spoke with the patient via phone to ensure availability of phone/video source, confirm preferred email & phone number, and discuss instructions and expectations.  I reminded [name] to be prepared with any vital sign and/or heart rhythm information that could potentially be obtained via home monitoring, at the time of his visit. I reminded SHEA KAPUR to expect a phone call prior to his visit.  Dorothey Baseman 02/26/2019 9:37 AM  INSTRUCTIONS FOR DOWNLOADING THE MYCHART APP TO SMARTPHONE  - The patient must first make sure to have activated MyChart and know their login information - If Apple, go to [employer] and type in MyChart in the search bar and download the app. If Android, ask patient to go to [employer] and type in [place] in the search bar and download the app. The app is free but as with any other app downloads, their phone may require them to verify saved payment information or Apple/Android password.  - The patient  will need to then log into the app with their MyChart username and password, and select [facility] as their healthcare provider to link the account. When it is time for your visit, go to the MyChart app, find appointments, and click Begin Video Visit. Be sure to Select Allow for your device to access the Microphone and Camera for your visit. You will then be connected, and your provider will be with you shortly.  **If they have any issues connecting, or need assistance please contact MyChart service desk [phone])**  **If using a computer, in order to ensure the best quality for their visit they will need to use either of the following Internet Browsers: [employer], or Google Chrome**  IF USING DOXIMITY or DOXY.ME - The patient will receive a link just prior to their visit by text.     FULL LENGTH CONSENT FOR TELE-HEALTH VISIT   I hereby voluntarily request, consent and authorize [facility] and its employed or contracted physicians, physician assistants, nurse practitioners or other licensed health care professionals (the Practitioner), to provide me with telemedicine health care services (the "Services") as deemed necessary by the treating Practitioner. I acknowledge and consent to receive the Services by the Practitioner via telemedicine. I understand that the telemedicine visit will involve communicating with the Practitioner through live audiovisual communication technology and the disclosure of certain medical information by electronic transmission. I acknowledge that I have been given the opportunity to request an in-person assessment or other available alternative prior to the telemedicine visit and am voluntarily participating in the telemedicine visit.  I understand that I have the right to withhold or withdraw my consent to the use of telemedicine in the course of my care at any time, without affecting my right to future care or treatment, and that the Practitioner  or I may terminate the telemedicine visit at any time. I understand that I have the right to inspect all information obtained and/or recorded in the course of the telemedicine visit and may receive copies of available information for a reasonable fee.  I understand that some of the potential risks of receiving the Services via telemedicine include:  Delay or interruption in medical evaluation due to technological equipment failure or disruption; Information transmitted may not be sufficient (e.g. poor resolution of images) to allow for appropriate medical decision making by the Practitioner; and/or  In rare instances, security protocols could fail, causing a breach of personal health information.  Furthermore, I acknowledge that it is my responsibility to provide information about my medical history, conditions and care that is complete and accurate to the best of my ability. I acknowledge that Practitioner's advice, recommendations, and/or decision may be based on factors not within their control, such as incomplete or inaccurate data provided by me or distortions of diagnostic images or specimens that may result from electronic transmissions. I understand that the practice of medicine is not an exact science and that Practitioner  makes no warranties or guarantees regarding treatment outcomes. I acknowledge that I will receive a copy of this consent concurrently upon execution via email to the email address I last provided but may also request a printed copy by calling the office of [facility].    I understand that my insurance will be billed for this visit.   I have read or had this consent read to me. I understand the contents of this consent, which adequately explains the benefits and risks of the Services being provided via telemedicine.  I have been provided ample opportunity to ask questions regarding this consent and the Services and have had my questions answered to my satisfaction. I give  my informed consent for the services to be provided through the use of telemedicine in my medical care  By participating in this telemedicine visit I agree to the above.

## 2019-02-27 ENCOUNTER — Telehealth (INDEPENDENT_AMBULATORY_CARE_PROVIDER_SITE_OTHER): Payer: Medicare Other | Admitting: Cardiovascular Disease

## 2019-02-27 ENCOUNTER — Encounter: Payer: Self-pay | Admitting: Cardiovascular Disease

## 2019-02-27 VITALS — Ht 69.0 in | Wt 202.2 lb

## 2019-02-27 DIAGNOSIS — N184 Chronic kidney disease, stage 4 (severe): Secondary | ICD-10-CM | POA: Diagnosis not present

## 2019-02-27 DIAGNOSIS — I5042 Chronic combined systolic (congestive) and diastolic (congestive) heart failure: Secondary | ICD-10-CM

## 2019-02-27 DIAGNOSIS — Z955 Presence of coronary angioplasty implant and graft: Secondary | ICD-10-CM

## 2019-02-27 DIAGNOSIS — I25118 Atherosclerotic heart disease of native coronary artery with other forms of angina pectoris: Secondary | ICD-10-CM

## 2019-02-27 DIAGNOSIS — I2510 Atherosclerotic heart disease of native coronary artery without angina pectoris: Secondary | ICD-10-CM

## 2019-02-27 DIAGNOSIS — E785 Hyperlipidemia, unspecified: Secondary | ICD-10-CM

## 2019-02-27 MED ORDER — XARELTO 2.5 MG PO TABS: 2.5000 mg | ORAL_TABLET | Freq: Two times a day (BID) | ORAL | 6 refills | Status: DC

## 2019-02-27 NOTE — Addendum Note (Signed)
Addended by: Merlene Laughter on: 02/27/2019 09:09 AM   Modules accepted: Orders

## 2019-02-27 NOTE — Progress Notes (Signed)
Virtual Visit via Telephone Note   This visit type was conducted due to national recommendations for restrictions regarding the COVID-19 Pandemic (e.g. social distancing) in an effort to limit this patient's exposure and mitigate transmission in our community.  Due to his co-morbid illnesses, this patient is at least at moderate risk for complications without adequate follow up.  This format is felt to be most appropriate for this patient at this time.  The patient did not have access to video technology/had technical difficulties with video requiring transitioning to audio format only (telephone).  All issues noted in this document were discussed and addressed.  No physical exam could be performed with this format.  Please refer to the patient's chart for his  consent to telehealth for [facility].   Date:  02/27/2019   ID:  Brad Mendoza, DOB 1955-07-22, MRN [id number]  Patient Location: Home Provider Location: Home  PCP:  [name], MD  Cardiologist:  Kate Sable, MD  Electrophysiologist:  None   Evaluation Performed:  Follow-Up Visit  Chief Complaint:  CHF  History of Present Illness:    Brad Mendoza is a 64 y.o. male with chronic kidney disease stage IV, mixed ischemic/nonischemic cardiomyopathy, hypertension, diabetes mellitus, and coronary artery disease.  Cardiomyopathy dates back to 2014.  He underwent right left heart catheterization in January 2020 and underwent drug-eluting stent placement to the proximal RCA.  Echocardiogram in December 2020 demonstrated LVEF 30 to 35% with normal right ventricular size and systolic function.  I have not personally evaluated him since December 2017.  He has since been followed by the advanced heart failure clinic.  He was recently seen by EP on 02/25/2019.  He has advanced renal failure.  He is considering subcutaneous ICD.  He was also recently diagnosed with COVID-19.  He did not qualify for monoclonal antibody infusion as  per NP notes dated 02/19/2019 which I reviewed.  He's been coughing which has caused some chest soreness. His breathing is improving. He denies leg swelling. He denies PND per se. He has had some orthopnea. He denies fevers.   Past Medical History:  Diagnosis Date  . Acid reflux   . Back pain    f4 and f5  . CAD (coronary artery disease)   . CHF (congestive heart failure) ([place])    last echo was in March 2016  . Diabetes mellitus 2005  . Diverticulosis   . H/O chest pain 2005   ECHO  . HTN (hypertension)   . Hyperlipidemia   . Nonischemic cardiomyopathy ([place])   . Onychomycosis   . Polysubstance abuse [facility])    Past Surgical History:  Procedure Laterality Date  . APPENDECTOMY    . CARDIAC CATHETERIZATION  2005   4 frech cath  . COLONOSCOPY N/A 08/01/2013   Procedure: COLONOSCOPY;  Surgeon: Rogene Houston, MD;  [facility];  Service: Endoscopy;  Laterality: N/A;  rescheduled to [place] notified pt  . CORONARY STENT INTERVENTION N/A 03/19/2018   Procedure: CORONARY STENT INTERVENTION;  Surgeon: Leonie Man, MD;  Location: [place] CV LAB;  Service: Cardiovascular;  Laterality: N/A;  . ESOPHAGOGASTRODUODENOSCOPY N/A 04/23/2015   Procedure: ESOPHAGOGASTRODUODENOSCOPY (EGD);  Surgeon: Rogene Houston, MD;  Location: [facility];  Service: Endoscopy;  Laterality: N/A;  1:25  . RIGHT/LEFT HEART CATH AND CORONARY ANGIOGRAPHY N/A 06/03/2016   Procedure: Right/Left Heart Cath and Coronary Angiography;  Surgeon: Belva Crome, MD;  Location: [place] CV LAB;  Service:  Cardiovascular;  Laterality: N/A;  . RIGHT/LEFT HEART CATH AND CORONARY ANGIOGRAPHY N/A 03/19/2018   Procedure: RIGHT/LEFT HEART CATH AND CORONARY ANGIOGRAPHY;  Surgeon: Jolaine Artist, MD;  Location: [place] CV LAB;  Service: Cardiovascular;  Laterality: N/A;     Current Meds  Medication Sig  . aspirin EC 81 MG tablet Take 81 mg by mouth daily.  . benzonatate (TESSALON PERLES) 100 MG capsule  Take 1 capsule (100 mg total) by mouth 3 (three) times daily as needed for cough.  . BRILINTA 90 MG TABS tablet TAKE 1 TABLET BY MOUTH TWICE DAILY.  . Capsaicin (ICY HOT ARTHRITIS THERAPY EX) Apply 1 application topically 2 (two) times daily as needed (back pain).   . carvedilol (COREG) 6.25 MG tablet Take 1 tablet (6.25 mg total) by mouth 2 (two) times daily with a meal.  . cetirizine (ZYRTEC) 10 MG tablet Take 1 tablet (10 mg total) by mouth daily. For itching  . Cholecalciferol (VITAMIN D3) 25 MCG (1000 UT) CAPS Take 2,000 Units by mouth daily.  . fluticasone (FLONASE) 50 MCG/ACT nasal spray USE 2 SPRAYS IN EACH NOSTRIL DAILY  . glucose blood (ONETOUCH VERIO) test strip USE FOUR TIMES DAILY AS NEEDED Dx E11.9  . hydrALAZINE (APRESOLINE) 100 MG tablet TAKE ONE (1) TABLET THREE (3) TIMES EACH DAY  . icosapent Ethyl (VASCEPA) 1 g capsule Take 2 capsules (2 g total) by mouth 2 (two) times daily.  . Insulin Glargine, 2 Unit Dial, (TOUJEO MAX SOLOSTAR) 300 UNIT/ML SOPN Inject 55 Units into the skin 2 (two) times daily.  . isosorbide mononitrate (IMDUR) 120 MG 24 hr tablet TAKE ONE (1) TABLET EACH DAY  . Multiple Vitamins-Minerals (CENTRUM SILVER ADULT 50+ PO) Take 1 tablet by mouth daily.  . nabumetone (RELAFEN) 500 MG tablet TAKE TWO TABLETS BY MOUTH TWICE DAILY  . nitroGLYCERIN (NITROSTAT) 0.4 MG SL tablet DISSOLVE 1 TAB UNDER TOUNGE FOR CHEST PAIN. MAY REPEAT EVERY 5 MINUTES FOR 3 DOSES. IF NO RELIEF CALL 911 OR GO TO ER  . Omega-3 Fatty Acids (FISH OIL) 1000 MG CPDR Take 1,000 mg by mouth daily.   . pantoprazole (PROTONIX) 40 MG tablet Take 1 tablet (40 mg total) by mouth 2 (two) times daily.  . pregabalin (LYRICA) 225 MG capsule TAKE 1 CAPSULE BY MOUTH AT BEDTIME  . PROAIR HFA 108 (90 Base) MCG/ACT inhaler USE 2 PUFFS EVERY 6 HOURS AS NEEDED  . ranolazine (RANEXA) 500 MG 12 hr tablet TAKE 1 TABLET BY MOUTH TWICE DAILY.  . rosuvastatin (CRESTOR) 20 MG tablet Take 1 tablet (20 mg total) by mouth  daily.  [name] terbinafine (LAMISIL) 1 % cream Apply 1 application topically 2 (two) times daily. Apply to both feet and between toes  . torsemide (DEMADEX) 20 MG tablet Take 2 tablets (40 mg total) by mouth daily.   Current Facility-Administered Medications for the 02/27/19 encounter (Telemedicine) with Herminio Commons, MD  Medication  . aflibercept (EYLEA) SOLN 2 mg     Allergies:   Sulfa antibiotics and Penicillins   Social History   Tobacco Use  . Smoking status: Never Smoker  . Smokeless tobacco: Current User    Types: Snuff  . Tobacco comment: dips snuff about 6 cans/week for 3 years  Substance Use Topics  . Alcohol use: Not Currently    Alcohol/week: 1.0 standard drinks    Types: 1 Cans of beer per week    Comment: occ - has cut down, now once every 2-3 months  .  Drug use: Yes    Types: Marijuana    Comment: occasional use - smoking less     Family Hx: The patient's family history includes Alcohol abuse in his brother; Cancer in his mother; Diabetes in his father; Hypertension in his father. There is no history of Colon cancer.  ROS:   Please see the history of present illness.     All other systems reviewed and are negative.   Prior CV studies:   The following studies were reviewed today:  Coronary stent intervention 03/19/2018:   Prox RCA lesion is 90% stenosed.  A drug-eluting stent was successfully placed using a STENT SYNERGY DES 2.5X16. Postdilated to 2.8 mm  Post intervention, there is a 0% residual stenosis.  Mid RCA lesion is 70% stenosed -stable from prior cath. Will be treated medically.   SUMMARY   Successful DES PCI to proximal RCA lesion with a Synergy DES 2.5 mg 16 mm postdilated to 2.8 mm.  Echocardiogram 01/22/2019:   1. Left ventricular ejection fraction, by visual estimation, is 30 to 35%. The left ventricle has moderate to severely decreased function. Left ventricular septal wall thickness was mildly increased. Mildly increased left  ventricular posterior wall  thickness. There is mildly increased left ventricular hypertrophy.  2. Elevated left ventricular end-diastolic pressure.  3. Left ventricular diastolic parameters are consistent with Grade I diastolic dysfunction (impaired relaxation).  4. The left ventricle demonstrates global hypokinesis.  5. Global right ventricle has normal systolic function.The right ventricular size is normal. No increase in right ventricular wall thickness.  6. Left atrial size was normal.  7. Right atrial size was normal.  8. The mitral valve is normal in structure. No evidence of mitral valve regurgitation. No evidence of mitral stenosis.  9. The tricuspid valve is normal in structure. Tricuspid valve regurgitation is mild. 10. The aortic valve is tricuspid. Aortic valve regurgitation is not visualized. No evidence of aortic valve sclerosis or stenosis. 11. The pulmonic valve was normal in structure. Pulmonic valve regurgitation is not visualized. 12. Moderately elevated pulmonary artery systolic pressure. 13. The inferior vena cava is normal in size with greater than 50% respiratory variability, suggesting right atrial pressure of 3 mmHg.  Labs/Other Tests and Data Reviewed:    EKG:  No ECG reviewed.  Recent Labs: 03/26/2018: Magnesium 2.2 12/20/2018: B Natriuretic Peptide 105.2 02/16/2019: ALT 33; BUN 55; Creatinine, Ser 2.90; Hemoglobin 11.1; Platelets 210; Potassium 4.0; Sodium 137   Recent Lipid Panel Lab Results  Component Value Date/Time   CHOL 310 (H) 01/22/2019 04:00 PM   CHOL 248 (H) 07/27/2018 02:43 PM   CHOL 277 (H) 07/11/2012 01:08 PM   TRIG 519 (H) 01/22/2019 04:00 PM   TRIG 658 (HH) 12/16/2013 02:59 PM   TRIG 522 (H) 07/11/2012 01:08 PM   HDL 38 (L) 01/22/2019 04:00 PM   HDL 31 (L) 07/27/2018 02:43 PM   HDL 42 12/16/2013 02:59 PM   HDL 39 (L) 07/11/2012 01:08 PM   CHOLHDL 8.2 01/22/2019 04:00 PM   LDLCALC UNABLE TO CALCULATE IF TRIGLYCERIDE OVER 400 mg/dL  01/22/2019 04:00 PM   LDLCALC Comment 07/27/2018 02:43 PM   LDLCALC 144 (H) 05/10/2013 12:25 PM   LDLCALC 134 (H) 07/11/2012 01:08 PM   LDLDIRECT 135.2 (H) 01/22/2019 04:00 PM    Wt Readings from Last 3 Encounters:  02/27/19 202 lb 3.2 oz (91.7 kg)  02/25/19 205 lb (93 kg)  02/16/19 205 lb (93 kg)     Objective:    Vital Signs:  Ht 5\' 9"  (1.753 m)   Wt 202 lb 3.2 oz (91.7 kg)   BMI 29.86 kg/m    VITAL SIGNS:  reviewed  ASSESSMENT & PLAN:    1.  Chronic systolic heart failure: He has a mixed nonischemic/ischemic cardiomyopathy.  Echocardiogram in December 2020 demonstrated LVEF 30 to 35% with diffuse hypokinesis.  He has NYHA class II symptoms.  Continue carvedilol, hydralazine, Imdur, and torsemide.  He is being considered for transcutaneous ICD.  2.  Chronic kidney disease stage IV: Continue torsemide.  Followed by nephrology. Creatinine 2.9 on 02/16/19.   3.  Coronary artery disease: Status post drug-eluting stent placement to the proximal right coronary artery in January 2020.  Symptomatically stable. Continue aspirin, beta-blocker, ranolazine, and statin.  I will discontinue ticagrelor and start Xarelto 2.5 mg twice daily as per [facility] protocol.  4.  Hyperlipidemia: Crestor increased to 20 mg on 01/22/19. Vascepa started 2 gm bid on 01/22/19. TC 310, TG 519 on 01/22/19. I will repeat lipids in 2 months.    COVID-19 Education: The signs and symptoms of COVID-19 were discussed with the patient and how to seek care for testing (follow up with PCP or arrange E-visit).  The importance of social distancing was discussed today.  Time:   Today, I have spent 25 minutes with the patient with telehealth technology discussing the above problems.     Medication Adjustments/Labs and Tests Ordered: Current medicines are reviewed at length with the patient today.  Concerns regarding medicines are outlined above.   Tests Ordered: No orders of the defined types were placed in this  encounter.   Medication Changes: No orders of the defined types were placed in this encounter.   Follow Up:  Virtual Visit  in 4 month(s)  Signed, Kate Sable, MD  02/27/2019 8:42 AM    [place]

## 2019-02-27 NOTE — Patient Instructions (Addendum)
Medication Instructions:   Your physician has recommended you make the following change in your medication:   Stop brilinta  Start xarelto 2.5 mg by mouth twice daily  Continue all other medications the same  Labwork: Your physician recommends that you return for a FASTING lipid profile: in 2 months. Please do not eat or drink for at least 8 hours when you have this done. You may take your medications that morning with a sip of water.  Testing/Procedures:  NONE  Follow-Up:  Your physician recommends that you schedule a follow-up appointment in: 4 months (virtual).  Any Other Special Instructions Will Be Listed Below (If Applicable).  If you need a refill on your cardiac medications before your next appointment, please call your pharmacy.

## 2019-03-04 ENCOUNTER — Other Ambulatory Visit (HOSPITAL_COMMUNITY): Payer: Self-pay | Admitting: Internal Medicine

## 2019-03-04 ENCOUNTER — Encounter (INDEPENDENT_AMBULATORY_CARE_PROVIDER_SITE_OTHER): Payer: Medicare Other | Admitting: Ophthalmology

## 2019-03-06 DIAGNOSIS — N179 Acute kidney failure, unspecified: Secondary | ICD-10-CM | POA: Diagnosis not present

## 2019-03-06 DIAGNOSIS — M5489 Other dorsalgia: Secondary | ICD-10-CM | POA: Diagnosis not present

## 2019-03-06 DIAGNOSIS — M47816 Spondylosis without myelopathy or radiculopathy, lumbar region: Secondary | ICD-10-CM | POA: Diagnosis not present

## 2019-03-06 DIAGNOSIS — E860 Dehydration: Secondary | ICD-10-CM | POA: Diagnosis not present

## 2019-03-06 DIAGNOSIS — Z743 Need for continuous supervision: Secondary | ICD-10-CM | POA: Diagnosis not present

## 2019-03-06 DIAGNOSIS — R197 Diarrhea, unspecified: Secondary | ICD-10-CM | POA: Diagnosis not present

## 2019-03-06 DIAGNOSIS — R0902 Hypoxemia: Secondary | ICD-10-CM | POA: Diagnosis not present

## 2019-03-06 DIAGNOSIS — U071 COVID-19: Secondary | ICD-10-CM | POA: Diagnosis not present

## 2019-03-06 DIAGNOSIS — R079 Chest pain, unspecified: Secondary | ICD-10-CM | POA: Diagnosis not present

## 2019-03-06 DIAGNOSIS — R000 Tachycardia, unspecified: Secondary | ICD-10-CM | POA: Diagnosis not present

## 2019-03-07 DIAGNOSIS — E119 Type 2 diabetes mellitus without complications: Secondary | ICD-10-CM | POA: Diagnosis not present

## 2019-03-07 DIAGNOSIS — Z87891 Personal history of nicotine dependence: Secondary | ICD-10-CM | POA: Diagnosis not present

## 2019-03-07 DIAGNOSIS — I509 Heart failure, unspecified: Secondary | ICD-10-CM | POA: Diagnosis not present

## 2019-03-07 DIAGNOSIS — R000 Tachycardia, unspecified: Secondary | ICD-10-CM | POA: Diagnosis not present

## 2019-03-07 DIAGNOSIS — R079 Chest pain, unspecified: Secondary | ICD-10-CM | POA: Diagnosis not present

## 2019-03-07 DIAGNOSIS — D649 Anemia, unspecified: Secondary | ICD-10-CM | POA: Diagnosis not present

## 2019-03-07 DIAGNOSIS — Z882 Allergy status to sulfonamides status: Secondary | ICD-10-CM | POA: Diagnosis not present

## 2019-03-07 DIAGNOSIS — Z880 Allergy status to penicillin: Secondary | ICD-10-CM | POA: Diagnosis not present

## 2019-03-07 DIAGNOSIS — E860 Dehydration: Secondary | ICD-10-CM | POA: Diagnosis not present

## 2019-03-07 DIAGNOSIS — E785 Hyperlipidemia, unspecified: Secondary | ICD-10-CM | POA: Diagnosis not present

## 2019-03-07 DIAGNOSIS — R197 Diarrhea, unspecified: Secondary | ICD-10-CM | POA: Diagnosis not present

## 2019-03-07 DIAGNOSIS — Z794 Long term (current) use of insulin: Secondary | ICD-10-CM | POA: Diagnosis not present

## 2019-03-07 DIAGNOSIS — I130 Hypertensive heart and chronic kidney disease with heart failure and stage 1 through stage 4 chronic kidney disease, or unspecified chronic kidney disease: Secondary | ICD-10-CM | POA: Diagnosis not present

## 2019-03-07 DIAGNOSIS — I2510 Atherosclerotic heart disease of native coronary artery without angina pectoris: Secondary | ICD-10-CM | POA: Diagnosis not present

## 2019-03-07 DIAGNOSIS — U071 COVID-19: Secondary | ICD-10-CM | POA: Diagnosis not present

## 2019-03-07 DIAGNOSIS — R5381 Other malaise: Secondary | ICD-10-CM | POA: Diagnosis not present

## 2019-03-07 DIAGNOSIS — Z7982 Long term (current) use of aspirin: Secondary | ICD-10-CM | POA: Diagnosis not present

## 2019-03-07 DIAGNOSIS — E1165 Type 2 diabetes mellitus with hyperglycemia: Secondary | ICD-10-CM | POA: Diagnosis not present

## 2019-03-07 DIAGNOSIS — N189 Chronic kidney disease, unspecified: Secondary | ICD-10-CM | POA: Diagnosis not present

## 2019-03-07 DIAGNOSIS — M47816 Spondylosis without myelopathy or radiculopathy, lumbar region: Secondary | ICD-10-CM | POA: Diagnosis not present

## 2019-03-07 DIAGNOSIS — J1282 Pneumonia due to Coronavirus disease 2019: Secondary | ICD-10-CM | POA: Diagnosis not present

## 2019-03-07 DIAGNOSIS — N179 Acute kidney failure, unspecified: Secondary | ICD-10-CM | POA: Diagnosis not present

## 2019-03-07 DIAGNOSIS — E1122 Type 2 diabetes mellitus with diabetic chronic kidney disease: Secondary | ICD-10-CM | POA: Diagnosis not present

## 2019-03-08 ENCOUNTER — Other Ambulatory Visit: Payer: Self-pay | Admitting: *Deleted

## 2019-03-08 ENCOUNTER — Ambulatory Visit: Payer: Self-pay | Admitting: *Deleted

## 2019-03-08 DIAGNOSIS — E119 Type 2 diabetes mellitus without complications: Secondary | ICD-10-CM | POA: Diagnosis not present

## 2019-03-08 DIAGNOSIS — U071 COVID-19: Secondary | ICD-10-CM | POA: Diagnosis not present

## 2019-03-08 DIAGNOSIS — R197 Diarrhea, unspecified: Secondary | ICD-10-CM | POA: Diagnosis not present

## 2019-03-08 DIAGNOSIS — E1142 Type 2 diabetes mellitus with diabetic polyneuropathy: Secondary | ICD-10-CM

## 2019-03-08 DIAGNOSIS — N179 Acute kidney failure, unspecified: Secondary | ICD-10-CM | POA: Diagnosis not present

## 2019-03-08 DIAGNOSIS — N189 Chronic kidney disease, unspecified: Secondary | ICD-10-CM | POA: Diagnosis not present

## 2019-03-08 NOTE — Patient Outreach (Addendum)
[facility]) Care Management  03/08/2019  Brad Mendoza December 12, 1955 [id number]   RN Health Coach Monthly Outreach  Referral Date:  12/14/2016 Referral Source:  Transfer from [place] Reason for Referral:  Continued Disease Management Education Insurance:  [employer] Medicare   Addendum:   RN Health Coach outreached to [name] at [employer] and discussed patient and conditions.  Kristen to contact patient next week to obtain consent to participate in the Chronic Care Management Program at [place] will make outreach attempt to patient within the month of February if patient does not agree to participate and transition to Chronic Care Management with the Embedded Team.  Outreach Attempt:  Outreach attempt #1 to patient for follow up. No answer and unable to leave voicemail message due to voicemail box not being set up.  Plan:  RN Health Coach will make another outreach attempt within the month of February.  [place] [phone] [email or web address]

## 2019-03-11 NOTE — Chronic Care Management (AMB) (Signed)
  Chronic Care Management   Note  03/08/2019 Name: Brad Mendoza MRN: [id number] DOB: November 09, 1955  Incoming call received from [place], Hubert Azure, RN. She has attempted to reach out to [name] regarding management of his diabetes and other chronic medical conditions. Her outreach has been unsuccessful. He is a PCP patient of Dr Livia Snellen. We discussed that our embedded CCM team can takeover attempts to assist [name] manage his chronic medical conditions.   Follow up plan:  An Initial Telephone Outreach has been scheduled with RN CCM for 03/15/2019.   I will review the CCM program and offer services at that time.   Chong Sicilian, BSN, RN-BC Embedded Chronic Care Manager [facility] / [place] [facility] Direct Dial: [phone]

## 2019-03-15 ENCOUNTER — Telehealth: Payer: Medicare Other

## 2019-03-18 ENCOUNTER — Ambulatory Visit (INDEPENDENT_AMBULATORY_CARE_PROVIDER_SITE_OTHER): Payer: Medicare Other | Admitting: Family Medicine

## 2019-03-18 ENCOUNTER — Encounter: Payer: Self-pay | Admitting: Family Medicine

## 2019-03-18 DIAGNOSIS — Z794 Long term (current) use of insulin: Secondary | ICD-10-CM

## 2019-03-18 DIAGNOSIS — U071 COVID-19: Secondary | ICD-10-CM

## 2019-03-18 DIAGNOSIS — E1142 Type 2 diabetes mellitus with diabetic polyneuropathy: Secondary | ICD-10-CM | POA: Diagnosis not present

## 2019-03-18 DIAGNOSIS — N289 Disorder of kidney and ureter, unspecified: Secondary | ICD-10-CM | POA: Diagnosis not present

## 2019-03-18 DIAGNOSIS — R2689 Other abnormalities of gait and mobility: Secondary | ICD-10-CM

## 2019-03-18 DIAGNOSIS — M5416 Radiculopathy, lumbar region: Secondary | ICD-10-CM | POA: Diagnosis not present

## 2019-03-18 MED ORDER — PREDNISONE 10 MG PO TABS: ORAL_TABLET | ORAL | 0 refills | Status: DC

## 2019-03-18 MED ORDER — PREGABALIN 200 MG PO CAPS: 400.0000 mg | ORAL_CAPSULE | Freq: Every day | ORAL | 2 refills | Status: AC

## 2019-03-18 MED ORDER — BLOOD GLUCOSE MONITOR KIT: PACK | 0 refills | Status: DC

## 2019-03-18 NOTE — Progress Notes (Signed)
Subjective:    Patient ID: [name], male    DOB: 1955/08/07, 64 y.o.   MRN: [id number]   HPI: CACE OSORTO is a 64 y.o. male presenting for back pain. Had L4-5 "pop" 15 years ago. Had 2 discs with cartilege worn out. Pain this time started 2 months ago. Pain radiates from back to LLE all the way to foot.  Ankle and foot go dead sometimes. (Numb) Pain is aching, throbbing. 6-8/10  [place] a few days ago. Went to hospital [facility]) and was diagnosed with CoVID. Still recuperating from CoVID. Had dehydration and had two pints of blood transfused. Still coughing. Using vitamins and zinc. No fever. O2 level good now on home pulse ox..   Depression screen [facility] 2/9 09/19/2018 07/27/2018 04/10/2018 01/25/2018 12/14/2016  Decreased Interest 0 0 0 0 1  Down, Depressed, Hopeless 0 0 0 0 0  PHQ - 2 Score 0 0 0 0 1  Some recent data might be hidden     Relevant past medical, surgical, family and social history reviewed and updated as indicated.  Interim medical history since our last visit reviewed. Allergies and medications reviewed and updated.  ROS:  Review of Systems   Social History   Tobacco Use  Smoking Status Never Smoker  Smokeless Tobacco Current User  . Types: Snuff  Tobacco Comment   dips snuff about 6 cans/week for 3 years       Objective:     Wt Readings from Last 3 Encounters:  02/27/19 202 lb 3.2 oz (91.7 kg)  02/25/19 205 lb (93 kg)  02/16/19 205 lb (93 kg)     Exam deferred. Pt. [place] due to COVID 19. Phone visit performed.   Assessment & Plan:   1. Imbalance   2. Lumbar radiculopathy   3. COVID-19 virus infection   4. Type 2 diabetes mellitus with diabetic polyneuropathy, with long-term current use of insulin ([place])   5. Renal insufficiency     Meds ordered this encounter  Medications  . pregabalin (LYRICA) 200 MG capsule    Sig: Take 2 capsules (400 mg total) by mouth at bedtime.    Dispense:  60 capsule    Refill:  2  . blood glucose meter  kit and supplies KIT    Sig: Dispense based on patient and insurance preference. Use up to four times daily as directed. (FOR ICD-10 : E11.9    Dispense:  1 each    Refill:  0    Order Specific Question:   Number of strips    Answer:   100    Order Specific Question:   Number of lancets    Answer:   100  . predniSONE (DELTASONE) 10 MG tablet    Sig: Take 5 daily for 2 days followed by 4,3,2 and 1 for 2 days each.    Dispense:  30 tablet    Refill:  0    Orders Placed This Encounter  Procedures  . For home use only DME Other see comment    Quad cane DX:M54.16    Order Specific Question:   Length of Need    Answer:   Lifetime  . Referral to Chronic Care Management Services    Referral Priority:   Routine    Referral Type:   Consultation    Referral Reason:   Care Coordination    Number of Visits Requested:   1      Diagnoses and all orders for this  visit:  Imbalance -     For home use only DME Other see comment -     Referral to Chronic Care Management Services  Lumbar radiculopathy -     pregabalin (LYRICA) 200 MG capsule; Take 2 capsules (400 mg total) by mouth at bedtime. -     predniSONE (DELTASONE) 10 MG tablet; Take 5 daily for 2 days followed by 4,3,2 and 1 for 2 days each. -     For home use only DME Other see comment -     Referral to Chronic Care Management Services  COVID-19 virus infection -     MYCHART COVID-19 HOME MONITORING PROGRAM -     Referral to Chronic Care Management Services  Type 2 diabetes mellitus with diabetic polyneuropathy, with long-term current use of insulin ([place]) -     blood glucose meter kit and supplies KIT; Dispense based on patient and insurance preference. Use up to four times daily as directed. (FOR ICD-10 : E11.9 -     Referral to Chronic Care Management Services  Renal insufficiency -     Referral to Chronic Care Management Services  The patient would benefit from a lumbar MRI.  However at this time he is recuperating from  Covid and may be shedding the virus still therefore MRI is not ethically feasible.  Additionally he is to have a shunt placed for dialysis soon.  Therefore contrast media would not be advisable at this time.  He is also being considered for implanted defibrillator.  At which time no further MRIs could be performed in the future anyway.  Should all of these events line up and make it possible MRI would be useful.  I have not had good luck with patients being seen by neurosurgery without an MRI.  Hopefully they will make an exception should that become necessary.  Virtual Visit via telephone Note  I discussed the limitations, risks, security and privacy concerns of performing an evaluation and management service by telephone and the availability of in person appointments. The patient was identified with two identifiers. Pt.expressed understanding and agreed to proceed. Pt. Is at home. Dr. Livia Snellen is in his office.  Follow Up Instructions:   I discussed the assessment and treatment plan with the patient. The patient was provided an opportunity to ask questions and all were answered. The patient agreed with the plan and demonstrated an understanding of the instructions.   The patient was advised to call back or seek an in-person evaluation if the symptoms worsen or if the condition fails to improve as anticipated.   Total minutes including chart review and phone contact time: 34   Follow up plan: Return in about 6 weeks (around 04/29/2019), or if symptoms worsen or fail to improve, for diabetes.  [name], MD [place]

## 2019-03-19 ENCOUNTER — Other Ambulatory Visit: Payer: Self-pay

## 2019-03-19 ENCOUNTER — Encounter: Payer: Medicare Other | Admitting: Vascular Surgery

## 2019-03-19 ENCOUNTER — Encounter (HOSPITAL_COMMUNITY): Payer: Medicare Other

## 2019-03-19 ENCOUNTER — Other Ambulatory Visit (HOSPITAL_COMMUNITY): Payer: Medicare Other

## 2019-03-19 DIAGNOSIS — N184 Chronic kidney disease, stage 4 (severe): Secondary | ICD-10-CM

## 2019-03-20 ENCOUNTER — Telehealth: Payer: Self-pay | Admitting: Family Medicine

## 2019-03-20 NOTE — Telephone Encounter (Signed)
Patient aware and verbalizes understanding. 

## 2019-03-20 NOTE — Telephone Encounter (Signed)
Please contact the patient . Try reducing the Lyrica to One a day. WS

## 2019-03-21 ENCOUNTER — Telehealth: Payer: Self-pay | Admitting: *Deleted

## 2019-03-21 ENCOUNTER — Ambulatory Visit (INDEPENDENT_AMBULATORY_CARE_PROVIDER_SITE_OTHER): Payer: Medicare Other | Admitting: *Deleted

## 2019-03-21 DIAGNOSIS — I5023 Acute on chronic systolic (congestive) heart failure: Secondary | ICD-10-CM | POA: Diagnosis not present

## 2019-03-21 DIAGNOSIS — E1142 Type 2 diabetes mellitus with diabetic polyneuropathy: Secondary | ICD-10-CM

## 2019-03-21 DIAGNOSIS — Z794 Long term (current) use of insulin: Secondary | ICD-10-CM

## 2019-03-21 DIAGNOSIS — E1100 Type 2 diabetes mellitus with hyperosmolarity without nonketotic hyperglycemic-hyperosmolar coma (NKHHC): Secondary | ICD-10-CM | POA: Diagnosis not present

## 2019-03-21 MED ORDER — BLOOD GLUCOSE MONITOR KIT: PACK | 0 refills | Status: AC

## 2019-03-21 NOTE — Telephone Encounter (Signed)
03/21/2019  I spoke with Brad Mendoza today by telephone and he is requesting a single point cane and a "rubber back brace" from [employer]. I see a DME order for a quad cane but I wasn't sure if it's been faxed over yet.   He also reports needing a new glucometer, which I see has been ordered. I'll make sure that was processed to [facility].   Thank you and let me know if I can be of assistance.   Chong Sicilian, BSN, RN-BC Embedded Chronic Care Manager [facility] / [place] Management Direct Dial: [phone]

## 2019-03-21 NOTE — Chronic Care Management (AMB) (Signed)
Chronic Care Management   Initial Visit Note  03/21/2019 Name: Brad Mendoza MRN: [id number] DOB: Dec 13, 1955  Referred by: [name], MD Reason for referral : Chronic Care Management (RN Initial outreach)   Brad Mendoza is a 64 y.o. year old male who is a primary care patient of Brad Mendoza, Brad Gash, MD. The CCM team was consulted for assistance with chronic disease management and care coordination needs related to HTN, CHF, CAD, DM, hyperlipidemia.  Review of patient status, including review of consultants reports, relevant laboratory and other test results, and collaboration with appropriate care team members and the patient's provider was performed as part of comprehensive patient evaluation and provision of chronic care management services.    SDOH (Social Determinants of Health) screening performed today: Tobacco Use Physical Activity. See Care Plan for related entries.   Subjective: I spoke with Brad Mendoza by telephone today. He has recovered from Covid-19 and is at home. He is requesting a single point cane, rubber back brace, and a new glucometer from [employer]. He does not have any problems with adequate housing and is able to get his medications. He has transportation assistance through [employer] that helps get him to his doctor's appointments.   Objective: Outpatient Encounter Medications as of 03/21/2019  Medication Sig  . aspirin EC 81 MG tablet Take 81 mg by mouth daily.  . benzonatate (TESSALON PERLES) 100 MG capsule Take 1 capsule (100 mg total) by mouth 3 (three) times daily as needed for cough.  . blood glucose meter kit and supplies KIT Dispense based on patient and insurance preference. Use up to four times daily as directed. (FOR ICD-10 : E11.9  . Capsaicin (ICY HOT ARTHRITIS THERAPY EX) Apply 1 application topically 2 (two) times daily as needed (back pain).   . carvedilol (COREG) 6.25 MG tablet Take 1 tablet (6.25 mg total) by mouth 2 (two) times daily with a  meal.  . cetirizine (ZYRTEC) 10 MG tablet Take 1 tablet (10 mg total) by mouth daily. For itching  . Cholecalciferol (VITAMIN D3) 25 MCG (1000 UT) CAPS Take 2,000 Units by mouth daily.  . fluticasone (FLONASE) 50 MCG/ACT nasal spray USE 2 SPRAYS IN EACH NOSTRIL DAILY  . glucose blood (ONETOUCH VERIO) test strip USE FOUR TIMES DAILY AS NEEDED Dx E11.9  . hydrALAZINE (APRESOLINE) 100 MG tablet TAKE ONE (1) TABLET THREE (3) TIMES EACH DAY  . icosapent Ethyl (VASCEPA) 1 g capsule Take 2 capsules (2 g total) by mouth 2 (two) times daily.  . Insulin Glargine, 2 Unit Dial, (TOUJEO MAX SOLOSTAR) 300 UNIT/ML SOPN Inject 55 Units into the skin 2 (two) times daily.  . isosorbide mononitrate (IMDUR) 120 MG 24 hr tablet TAKE ONE (1) TABLET EACH DAY  . Multiple Vitamins-Minerals (CENTRUM SILVER ADULT 50+ PO) Take 1 tablet by mouth daily.  . nabumetone (RELAFEN) 500 MG tablet TAKE TWO TABLETS BY MOUTH TWICE DAILY  . nitroGLYCERIN (NITROSTAT) 0.4 MG SL tablet DISSOLVE 1 TAB UNDER TOUNGE FOR CHEST PAIN. MAY REPEAT EVERY 5 MINUTES FOR 3 DOSES. IF NO RELIEF CALL 911 OR GO TO ER  . Omega-3 Fatty Acids (FISH OIL) 1000 MG CPDR Take 1,000 mg by mouth daily.   . pantoprazole (PROTONIX) 40 MG tablet Take 1 tablet (40 mg total) by mouth 2 (two) times daily.  . predniSONE (DELTASONE) 10 MG tablet Take 5 daily for 2 days followed by 4,3,2 and 1 for 2 days each.  . pregabalin (LYRICA) 200 MG capsule Take 2 capsules (400  mg total) by mouth at bedtime.  [name] PROAIR HFA 108 (90 Base) MCG/ACT inhaler USE 2 PUFFS EVERY 6 HOURS AS NEEDED  . ranolazine (RANEXA) 500 MG 12 hr tablet TAKE 1 TABLET BY MOUTH TWICE DAILY.  . rivaroxaban (XARELTO) 2.5 MG TABS tablet Take 1 tablet (2.5 mg total) by mouth 2 (two) times daily.  . rosuvastatin (CRESTOR) 20 MG tablet Take 1 tablet (20 mg total) by mouth daily.  [name] terbinafine (LAMISIL) 1 % cream Apply 1 application topically 2 (two) times daily. Apply to both feet and between toes  . torsemide  (DEMADEX) 20 MG tablet Take 2 tablets (40 mg total) by mouth daily.   Facility-Administered Encounter Medications as of 03/21/2019  Medication  . aflibercept (EYLEA) SOLN 2 mg    Lab Results  Component Value Date   HGBA1C 13.1 (H) 07/27/2018   HGBA1C 12.7 (H) 04/10/2018   HGBA1C 11.7 (H) 03/19/2018   Lab Results  Component Value Date   MICROALBUR 100 10/23/2014   LDLCALC UNABLE TO CALCULATE IF TRIGLYCERIDE OVER 400 mg/dL 01/22/2019   CREATININE 2.90 (H) 02/16/2019   BP Readings from Last 3 Encounters:  02/16/19 (!) 159/81  01/28/19 117/88  01/22/19 117/88     RN Care Plan           This Visit's Progress     Patient Stated   . "I would like to get a cane" (pt-stated)       Current Barriers:  [name] Knowledge Deficits related to how to obtain a cane . Film/video editor.   Nurse Case Manager Clinical Goal(s):  [name] Over the next 30 days, patient will work with PCP office to obtain order for single point cane  Interventions:  . Talked with patient by telephone o Discussed patients desire/need for a cane. He would like a single point cane from [place] . Chart reviewed, including recent office notes . RN will collaborate with PCP office regarding order for cane  Patient Self Care Activities:  . Performs ADL's independently . Performs IADL's independently  Initial goal documentation     . Chronic Care Management Needs (pt-stated)       Current Barriers:  . Chronic Disease Management support, education, and care coordination needs related to HTN, CHF, DM, CAD, hyperlipidemia  Clinical Goal(s) related to HTN, CHF, DM, CAD, hyperlipidemia:  Over the next 45 days, patient will:  . Work with the care management team to address educational, disease management, and care coordination needs  . Begin or continue self health monitoring activities as directed today Measure and record cbg (blood glucose) 2 times daily . Call provider office for new or worsened signs  and symptoms Blood glucose findings outside established parameters . Call care management team with questions or concerns . Verbalize basic understanding of patient centered plan of care established today  Interventions related to HTN, CHF, DM, CAD, hyperlipidemia:  . Evaluation of current treatment plans and patient's adherence to plan as established by provider . Assessed patient understanding of disease states . Assessed patient's education and care coordination needs . Provided disease specific education to patient  . Collaborated with appropriate clinical care team members regarding patient needs  Patient Self Care Activities related to HTN, CHF, DM, CAD, hyperlipidemia:  . Patient is unable to independently self-manage chronic health conditions  Initial goal documentation       Other   . Blood Sugar Management       Current Barriers:  . Film/video editor.  . Chronic  Disease Management support and education needs related to diabetes  Nurse Case Manager Clinical Goal(s):  [name] Over the next 30 days, patient will work with Consulting civil engineer to address needs related to diabetes management  Interventions:  . Evaluation of current treatment plan related to diabetes management and patient's adherence to plan as established by provider. . Advised patient to check and record blood sugar daily as directed . Reviewed medications with patient and discussed prednisone's effects on blood sugar . Discussed plans with patient for ongoing care management follow up and provided patient with direct contact information for care management team . Discussed need for new blood sugar meter . RN will collaborate with PCP office to obtain new order for blood sugar meter and supplies from [facility]  Patient Self Care Activities:  . Performs ADL's independently . Performs IADL's independently   Initial goal documentation         Brad. Hallisey was given information about Chronic Care  Management services today including:  1. CCM service includes personalized support from designated clinical staff supervised by his physician, including individualized plan of care and coordination with other care providers 2. 24/7 contact phone numbers for assistance for urgent and routine care needs. 3. Service will only be billed when office clinical staff spend 20 minutes or more in a month to coordinate care. 4. Only one practitioner may furnish and bill the service in a calendar month. 5. The patient may stop CCM services at any time (effective at the end of the month) by phone call to the office staff. 6. The patient will be responsible for cost sharing (co-pay) of up to 20% of the service fee (after annual deductible is met).  Patient agreed to services and verbal consent obtained.   Plan:   The care management team will reach out to the patient again over the next 30 days.   Chong Sicilian, BSN, RN-BC Embedded Chronic Care Manager [facility] Direct Dial: [phone]

## 2019-03-21 NOTE — Telephone Encounter (Signed)
Pt wants rx for cane and rubber back brace. Send to [employer]

## 2019-03-21 NOTE — Patient Instructions (Signed)
Visit Information  Goals Addressed            This Visit's Progress     Patient Stated   . "I would like to get a cane" (pt-stated)       Current Barriers:  [name] Knowledge Deficits related to how to obtain a cane . Film/video editor.   Nurse Case Manager Clinical Goal(s):  [name] Over the next 30 days, patient will work with PCP office to obtain order for single point cane  Interventions:  . Talked with patient by telephone o Discussed patients desire/need for a cane. He would like a single point cane from [place] . Chart reviewed, including recent office notes . RN will collaborate with PCP office regarding order for cane  Patient Self Care Activities:  . Performs ADL's independently . Performs IADL's independently  Initial goal documentation     . Chronic Care Management Needs (pt-stated)       Current Barriers:  . Chronic Disease Management support, education, and care coordination needs related to HTN, CHF, DM, CAD, hyperlipidemia  Clinical Goal(s) related to HTN, CHF, DM, CAD, hyperlipidemia:  Over the next 45 days, patient will:  . Work with the care management team to address educational, disease management, and care coordination needs  . Begin or continue self health monitoring activities as directed today Measure and record cbg (blood glucose) 2 times daily . Call provider office for new or worsened signs and symptoms Blood glucose findings outside established parameters . Call care management team with questions or concerns . Verbalize basic understanding of patient centered plan of care established today  Interventions related to HTN, CHF, DM, CAD, hyperlipidemia:  . Evaluation of current treatment plans and patient's adherence to plan as established by provider . Assessed patient understanding of disease states . Assessed patient's education and care coordination needs . Provided disease specific education to patient  . Collaborated with appropriate  clinical care team members regarding patient needs  Patient Self Care Activities related to HTN, CHF, DM, CAD, hyperlipidemia:  . Patient is unable to independently self-manage chronic health conditions  Initial goal documentation       Other   . Blood Sugar Management       Current Barriers:  . Film/video editor.  . Chronic Disease Management support and education needs related to diabetes  Nurse Case Manager Clinical Goal(s):  [name] Over the next 30 days, patient will work with Consulting civil engineer to address needs related to diabetes management  Interventions:  . Evaluation of current treatment plan related to diabetes management and patient's adherence to plan as established by provider. . Advised patient to check and record blood sugar daily as directed . Reviewed medications with patient and discussed prednisone's effects on blood sugar . Discussed plans with patient for ongoing care management follow up and provided patient with direct contact information for care management team . Discussed need for new blood sugar meter . RN will collaborate with PCP office to obtain new order for blood sugar meter and supplies from [place]  Patient Self Care Activities:  . Performs ADL's independently . Performs IADL's independently   Initial goal documentation        The patient verbalized understanding of instructions provided today and declined a print copy of patient instruction materials.   The care management team will reach out to the patient again over the next 30 days.   Mr. Bleicher was given information about Chronic Care Management services today including:  1. CCM service includes personalized support from designated clinical staff supervised by his physician, including individualized plan of care and coordination with other care providers 2. 24/7 contact phone numbers for assistance for urgent and routine care needs. 3. Service will only be billed when office  clinical staff spend 20 minutes or more in a month to coordinate care. 4. Only one practitioner may furnish and bill the service in a calendar month. 5. The patient may stop CCM services at any time (effective at the end of the month) by phone call to the office staff. 6. The patient will be responsible for cost sharing (co-pay) of up to 20% of the service fee (after annual deductible is met).  Patient agreed to services and verbal consent obtained.   Chong Sicilian, BSN, RN-BC Embedded Chronic Care Manager [facility] [place] [facility] Direct Dial: [phone]

## 2019-03-22 ENCOUNTER — Other Ambulatory Visit: Payer: Self-pay | Admitting: Family Medicine

## 2019-03-22 ENCOUNTER — Telehealth: Payer: Self-pay | Admitting: *Deleted

## 2019-03-22 DIAGNOSIS — M5416 Radiculopathy, lumbar region: Secondary | ICD-10-CM

## 2019-03-22 DIAGNOSIS — R2689 Other abnormalities of gait and mobility: Secondary | ICD-10-CM | POA: Insufficient documentation

## 2019-03-22 NOTE — Telephone Encounter (Signed)
Order for [facility] faxed to [employer].

## 2019-03-22 NOTE — Telephone Encounter (Signed)
faxed

## 2019-03-22 NOTE — Telephone Encounter (Signed)
I printed herequested scrips. Just need to be faaxed to [facility]

## 2019-03-25 ENCOUNTER — Encounter (HOSPITAL_COMMUNITY): Payer: Medicare Other

## 2019-03-27 ENCOUNTER — Other Ambulatory Visit (HOSPITAL_COMMUNITY): Payer: Self-pay | Admitting: Internal Medicine

## 2019-03-27 ENCOUNTER — Other Ambulatory Visit: Payer: Self-pay | Admitting: Family Medicine

## 2019-03-27 DIAGNOSIS — I10 Essential (primary) hypertension: Secondary | ICD-10-CM

## 2019-03-27 DIAGNOSIS — K219 Gastro-esophageal reflux disease without esophagitis: Secondary | ICD-10-CM

## 2019-03-28 ENCOUNTER — Emergency Department (HOSPITAL_COMMUNITY)
Admission: EM | Admit: 2019-03-28 | Discharge: 2019-03-29 | Disposition: A | Discharge status: Home / Self Care | Payer: Medicare Other | Attending: Emergency Medicine | Admitting: Emergency Medicine

## 2019-03-28 ENCOUNTER — Encounter (HOSPITAL_COMMUNITY): Payer: Self-pay | Admitting: Emergency Medicine

## 2019-03-28 ENCOUNTER — Other Ambulatory Visit: Payer: Self-pay

## 2019-03-28 ENCOUNTER — Emergency Department (HOSPITAL_COMMUNITY): Payer: Medicare Other

## 2019-03-28 DIAGNOSIS — R519 Headache, unspecified: Secondary | ICD-10-CM

## 2019-03-28 DIAGNOSIS — I5022 Chronic systolic (congestive) heart failure: Secondary | ICD-10-CM | POA: Insufficient documentation

## 2019-03-28 DIAGNOSIS — I10 Essential (primary) hypertension: Secondary | ICD-10-CM | POA: Diagnosis not present

## 2019-03-28 DIAGNOSIS — R42 Dizziness and giddiness: Secondary | ICD-10-CM | POA: Diagnosis not present

## 2019-03-28 DIAGNOSIS — Z794 Long term (current) use of insulin: Secondary | ICD-10-CM | POA: Insufficient documentation

## 2019-03-28 DIAGNOSIS — I2510 Atherosclerotic heart disease of native coronary artery without angina pectoris: Secondary | ICD-10-CM | POA: Insufficient documentation

## 2019-03-28 DIAGNOSIS — R739 Hyperglycemia, unspecified: Secondary | ICD-10-CM

## 2019-03-28 DIAGNOSIS — R531 Weakness: Secondary | ICD-10-CM | POA: Diagnosis not present

## 2019-03-28 DIAGNOSIS — Z79899 Other long term (current) drug therapy: Secondary | ICD-10-CM | POA: Insufficient documentation

## 2019-03-28 DIAGNOSIS — Z7982 Long term (current) use of aspirin: Secondary | ICD-10-CM | POA: Diagnosis not present

## 2019-03-28 DIAGNOSIS — Z743 Need for continuous supervision: Secondary | ICD-10-CM | POA: Diagnosis not present

## 2019-03-28 DIAGNOSIS — E1165 Type 2 diabetes mellitus with hyperglycemia: Secondary | ICD-10-CM | POA: Diagnosis not present

## 2019-03-28 DIAGNOSIS — I110 Hypertensive heart disease with heart failure: Secondary | ICD-10-CM | POA: Insufficient documentation

## 2019-03-28 LAB — BASIC METABOLIC PANEL
Anion gap: 10 (ref 5–15)
BUN: 51 mg/dL — ABNORMAL HIGH (ref 8–23)
CO2: 25 mmol/L (ref 22–32)
Calcium: 8.3 mg/dL — ABNORMAL LOW (ref 8.9–10.3)
Chloride: 95 mmol/L — ABNORMAL LOW (ref 98–111)
Creatinine, Ser: 1.82 mg/dL — ABNORMAL HIGH (ref 0.61–1.24)
GFR calc Af Amer: 45 mL/min — ABNORMAL LOW (ref >60)
GFR calc non Af Amer: 39 mL/min — ABNORMAL LOW (ref >60)
Glucose, Bld: 596 mg/dL (ref 70–99)
Potassium: 4.4 mmol/L (ref 3.5–5.1)
Sodium: 130 mmol/L — ABNORMAL LOW (ref 135–145)

## 2019-03-28 LAB — CBC WITH DIFFERENTIAL/PLATELET
Abs Immature Granulocytes: 0.05 10*3/uL (ref 0.00–0.07)
Basophils Absolute: 0 10*3/uL (ref 0.0–0.1)
Basophils Relative: 0 %
Eosinophils Absolute: 0.1 10*3/uL (ref 0.0–0.5)
Eosinophils Relative: 1 %
HCT: 34.8 % — ABNORMAL LOW (ref 39.0–52.0)
Hemoglobin: 11.3 g/dL — ABNORMAL LOW (ref 13.0–17.0)
Immature Granulocytes: 1 %
Lymphocytes Relative: 14 %
Lymphs Abs: 1.4 10*3/uL (ref 0.7–4.0)
MCH: 27.5 pg (ref 26.0–34.0)
MCHC: 32.5 g/dL (ref 30.0–36.0)
MCV: 84.7 fL (ref 80.0–100.0)
Monocytes Absolute: 1.4 10*3/uL — ABNORMAL HIGH (ref 0.1–1.0)
Monocytes Relative: 14 %
Neutro Abs: 7.1 10*3/uL (ref 1.7–7.7)
Neutrophils Relative %: 70 %
Platelets: 236 10*3/uL (ref 150–400)
RBC: 4.11 MIL/uL — ABNORMAL LOW (ref 4.22–5.81)
RDW: 13.9 % (ref 11.5–15.5)
WBC: 10 10*3/uL (ref 4.0–10.5)
nRBC: 0 % (ref 0.0–0.2)

## 2019-03-28 LAB — CBG MONITORING, ED
Glucose-Capillary: 538 mg/dL (ref 70–99)
Glucose-Capillary: 491 mg/dL — ABNORMAL HIGH (ref 70–99)
Glucose-Capillary: 406 mg/dL — ABNORMAL HIGH (ref 70–99)

## 2019-03-28 LAB — URINALYSIS, ROUTINE W REFLEX MICROSCOPIC
Bacteria, UA: NONE SEEN
Bilirubin Urine: NEGATIVE
Glucose, UA: >=500 mg/dL — AB
Ketones, ur: NEGATIVE mg/dL
Leukocytes,Ua: NEGATIVE
Nitrite: NEGATIVE
Protein, ur: 100 mg/dL — AB
Specific Gravity, Urine: 1.02 (ref 1.005–1.030)
pH: 5 (ref 5.0–8.0)

## 2019-03-28 LAB — CARBOXYHEMOGLOBIN - COOX: Carboxyhemoglobin: 2.1 % — ABNORMAL HIGH (ref 0.5–1.5)

## 2019-03-28 MED ORDER — INSULIN GLARGINE 100 UNIT/ML ~~LOC~~ SOLN
55.0000 [IU] | Freq: Once | SUBCUTANEOUS | Status: AC
  Administered 2019-03-28: 23:00:00 55 [IU] via SUBCUTANEOUS
  Filled 2019-03-28: qty 0.55

## 2019-03-28 MED ORDER — INSULIN ASPART 100 UNIT/ML ~~LOC~~ SOLN
5.0000 [IU] | Freq: Once | SUBCUTANEOUS | Status: AC
  Administered 2019-03-28: 21:00:00 5 [IU] via INTRAVENOUS
  Filled 2019-03-28: qty 1

## 2019-03-28 MED ORDER — ACETAMINOPHEN 325 MG PO TABS
650.0000 mg | ORAL_TABLET | Freq: Once | ORAL | Status: AC
  Administered 2019-03-28: 650 mg via ORAL
  Filled 2019-03-28: qty 2

## 2019-03-28 MED ORDER — POTASSIUM CHLORIDE 10 MEQ/100ML IV SOLN
10.0000 meq | INTRAVENOUS | Status: AC
  Administered 2019-03-28: 10 meq via INTRAVENOUS
  Filled 2019-03-28 (×2): qty 100

## 2019-03-28 MED ORDER — SODIUM CHLORIDE 0.9 % IV BOLUS
500.0000 mL | Freq: Once | INTRAVENOUS | Status: AC
  Administered 2019-03-28: 19:00:00 500 mL via INTRAVENOUS

## 2019-03-28 MED ORDER — INSULIN ASPART 100 UNIT/ML ~~LOC~~ SOLN
10.0000 [IU] | Freq: Once | SUBCUTANEOUS | Status: AC
  Administered 2019-03-28: 23:00:00 10 [IU] via INTRAVENOUS
  Filled 2019-03-28: qty 1

## 2019-03-28 MED ORDER — SODIUM CHLORIDE 0.9 % IV BOLUS
500.0000 mL | Freq: Once | INTRAVENOUS | Status: AC
  Administered 2019-03-28: 500 mL via INTRAVENOUS

## 2019-03-28 MED ORDER — POTASSIUM CHLORIDE 10 MEQ/100ML IV SOLN
10.0000 meq | Freq: Once | INTRAVENOUS | Status: AC
  Administered 2019-03-28: 23:00:00 10 meq via INTRAVENOUS

## 2019-03-28 NOTE — ED Triage Notes (Signed)
Pt c/o of hyperglycemia.  Pt has been without his meds x 3 days.

## 2019-03-28 NOTE — ED Notes (Signed)
Date and time results received: 03/28/19 2006   Test: Glucose Critical Value: 596  Name of Provider Notified: Wilson Singer, MD

## 2019-03-28 NOTE — ED Provider Notes (Signed)
[facility] Provider Note   CSN: [id number] Arrival date & time: 03/28/19  1821     History Chief Complaint  Patient presents with  . Hyperglycemia    Brad Mendoza is a 64 y.o. male with history of hypertension, hyperlipidemia, CHF LVEF 30 to 35%, nonischemic cardiomyopathy, CAD, polysubstance abuse presents for evaluation of gradual onset, persistent and progressively worsening left-sided headache as well as hyperglycemia for 3 days.  He tells me that he lives in a very cold trailer and has been using a kerosene heater for the last week or so to heat his home.  He states that over the last 3 days he has been so cold that he has only had enough energy to get out of bed to make meals but has not been taking any of his medications.  He last checked his blood sugar 3 days ago and tells me it was high but cannot remember what it was.  He notes sharp left-sided headache around the left eye radiating to the crown.  No aggravating or alleviating factors noted.  He denies vision changes, nausea, vomiting, chest pain or shortness of breath.  He has had no fevers or neck stiffness.  He does note scratchy throat, dry mouth, polydipsia and polyuria.  He does note a little bit of suprapubic abdominal pain as well as dysuria and urinary frequency.  He has not tried anything for his symptoms.  The history is provided by the patient.       Past Medical History:  Diagnosis Date  . Acid reflux   . Back pain    f4 and f5  . CAD (coronary artery disease)   . CHF (congestive heart failure) ([place])    last echo was in March 2016  . Diabetes mellitus 2005  . Diverticulosis   . H/O chest pain 2005   ECHO  . HTN (hypertension)   . Hyperlipidemia   . Nonischemic cardiomyopathy ([place])   . Onychomycosis   . Polysubstance abuse [facility])     Patient Active Problem List   Diagnosis Date Noted  . Lumbar radiculopathy 03/22/2019  . Imbalance 03/22/2019  . Acute on chronic systolic  (congestive) heart failure ([place]) 03/19/2018  . 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 Onychomycosis 05/23/2017  . Acid reflux 05/23/2017  . Cardiomyopathy ([place]) 05/23/2017  . Anginal pain ([place]) 06/01/2016  . Angina pectoris ([place]) 06/01/2016  . CHF exacerbation ([place]) 11/17/2015  . Congestive heart failure ([place]) 11/17/2015  . Chronic combined systolic and diastolic heart failure ([place]) 11/16/2015  . Type 2 diabetes mellitus with hyperosmolar nonketotic hyperglycemia ([place]) 05/08/2015  . Hyponatremia 05/08/2015  . Hypochloremia 05/08/2015  . Type 2 diabetes mellitus with other specified complication ([place]) 38/18/4037  . Type 2 diabetes mellitus with diabetic polyneuropathy ([place]) 12/16/2013  . Type 2 diabetes mellitus ([place]) 12/16/2013  . Coronary artery disease involving native coronary artery of native heart with angina pectoris ([place]) 09/18/2013  . Coronary arteriosclerosis 09/18/2013  . Heme positive stool 06/12/2013  . Occult blood in stools 06/12/2013  . Seasonal allergic rhinitis 07/11/2012  . Polysubstance abuse ([place]) 12/24/2010  . Nonischemic cardiomyopathy ([place])   . Diabetic retinopathy of both eyes associated with type 2 diabetes mellitus ([place])   . Hyperlipemia 11/16/2009  . Obesity 11/16/2009  . Tobacco dependence 11/16/2009  . Essential hypertension 11/16/2009  . Hyperlipidemia 11/16/2009  . Tobacco dependence syndrome 11/16/2009    Past Surgical History:  Procedure Laterality Date  . APPENDECTOMY    . CARDIAC CATHETERIZATION  2005   4 frech cath  . COLONOSCOPY N/A 08/01/2013   Procedure: COLONOSCOPY;  Surgeon: Rogene Houston, MD;  [facility];  Service: Endoscopy;  Laterality: N/A;  rescheduled to [place] notified pt  . CORONARY STENT INTERVENTION N/A 03/19/2018   Procedure: CORONARY STENT INTERVENTION;  Surgeon: Leonie Man, MD;  Location: [facility] LAB;  Service: Cardiovascular;  Laterality: N/A;  . ESOPHAGOGASTRODUODENOSCOPY N/A 04/23/2015   Procedure:  ESOPHAGOGASTRODUODENOSCOPY (EGD);  Surgeon: Rogene Houston, MD;  Location: [facility];  Service: Endoscopy;  Laterality: N/A;  1:25  . RIGHT/LEFT HEART CATH AND CORONARY ANGIOGRAPHY N/A 06/03/2016   Procedure: Right/Left Heart Cath and Coronary Angiography;  Surgeon: Belva Crome, MD;  Location: [facility];  Service: Cardiovascular;  Laterality: N/A;  . RIGHT/LEFT HEART CATH AND CORONARY ANGIOGRAPHY N/A 03/19/2018   Procedure: RIGHT/LEFT HEART CATH AND CORONARY ANGIOGRAPHY;  Surgeon: Jolaine Artist, MD;  Location: [facility];  Service: Cardiovascular;  Laterality: N/A;       Family History  Problem Relation Age of Onset  . Hypertension Father   . Diabetes Father   . Cancer Mother   . Alcohol abuse Brother   . Colon cancer Neg Hx     Social History   Tobacco Use  . Smoking status: Never Smoker  . Smokeless tobacco: Current User    Types: Snuff  . Tobacco comment: dips snuff about 6 cans/week for 3 years  Substance Use Topics  . Alcohol use: Not Currently    Alcohol/week: 1.0 standard drinks    Types: 1 Cans of beer per week    Comment: occ - has cut down, now once every 2-3 months  . Drug use: Yes    Types: Marijuana    Comment: occasional use - smoking less    Home Medications Prior to Admission medications   Medication Sig Start Date End Date Taking? Authorizing Provider  aspirin EC 81 MG tablet Take 81 mg by mouth daily.    [provider]  benzonatate (TESSALON PERLES) 100 MG capsule Take 1 capsule (100 mg total) by mouth 3 (three) times daily as needed for cough. Patient not taking: Reported on 03/21/2019 02/20/19   Baruch Gouty, FNP  blood glucose meter kit and supplies KIT Dispense based on patient and insurance preference. Test four times daily as directed. (FOR ICD-10 : E11.9 03/21/19   [name], MD  Capsaicin (ICY HOT ARTHRITIS THERAPY EX) Apply 1 application topically 2 (two) times daily as needed (back pain).     [provider]  carvedilol (COREG) 6.25 MG tablet Take 1 tablet (6.25 mg total) by mouth 2 (two) times daily with a meal. 02/04/19   Larey Dresser, MD  cetirizine (ZYRTEC) 10 MG tablet Take 1 tablet (10 mg total) by mouth daily. For itching 09/04/18   [name], MD  Cholecalciferol (VITAMIN D3) 25 MCG (1000 UT) CAPS Take 2,000 Units by mouth daily.    [provider]  fluticasone (FLONASE) 50 MCG/ACT nasal spray USE 2 SPRAYS IN [facility] NOSTRIL DAILY 09/18/18   Terald Sleeper, PA-C  glucose blood (ONETOUCH VERIO) test strip USE FOUR TIMES DAILY AS NEEDED Dx E11.9 09/20/18   [name], MD  hydrALAZINE (APRESOLINE) 100 MG tablet TAKE (1) TABLET BY MOUTH (3) TIMES DAILY. 03/27/19   [name], MD  icosapent Ethyl (VASCEPA) 1 g capsule Take 2 capsules (2 g total) by mouth 2 (two) times daily. 02/04/19  Larey Dresser, MD  Insulin Glargine, 2 Unit Dial, (TOUJEO MAX SOLOSTAR) 300 UNIT/ML SOPN Inject 55 Units into the skin 2 (two) times daily. 02/12/19   [name], MD  isosorbide mononitrate (IMDUR) 120 MG 24 hr tablet TAKE (1) TABLET BY MOUTH ONCE DAILY. 03/27/19   [name], MD  Multiple Vitamins-Minerals (CENTRUM SILVER ADULT 50+ PO) Take 1 tablet by mouth daily.    [provider]  nabumetone (RELAFEN) 500 MG tablet TAKE TWO TABLETS BY MOUTH TWICE DAILY 09/18/18   [name], MD  nitroGLYCERIN (NITROSTAT) 0.4 MG SL tablet DISSOLVE 1 TAB UNDER TOUNGE FOR CHEST PAIN. MAY REPEAT EVERY 5 MINUTES FOR 3 DOSES. IF NO RELIEF CALL 911 OR GO TO ER 09/20/18   [name], MD  Omega-3 Fatty Acids (FISH OIL) 1000 MG CPDR Take 1,000 mg by mouth daily.     [provider]  pantoprazole (PROTONIX) 40 MG tablet TAKE ONE TABLET BY MOUTH TWICE DAILY. 03/27/19   [name], MD  predniSONE (DELTASONE) 10 MG tablet Take 5 daily for 2 days followed by 4,3,2 and 1 for 2 days each. 03/18/19   [name], MD  pregabalin (LYRICA) 200 MG capsule Take 2 capsules (400 mg total)  by mouth at bedtime. 03/18/19   [name], MD  PROAIR HFA 108 [phone] Base) MCG/ACT inhaler USE 2 PUFFS EVERY 6 HOURS AS NEEDED 09/18/18   [name], MD  ranolazine (RANEXA) 500 MG 12 hr tablet TAKE 1 TABLET BY MOUTH TWICE DAILY. 03/27/19   Bensimhon, Shaune Pascal, MD  rivaroxaban (XARELTO) 2.5 MG TABS tablet Take 1 tablet (2.5 mg total) by mouth 2 (two) times daily. 02/27/19   Herminio Commons, MD  rosuvastatin (CRESTOR) 20 MG tablet Take 1 tablet (20 mg total) by mouth daily. 02/04/19   Larey Dresser, MD  terbinafine (LAMISIL) 1 % cream Apply 1 application topically 2 (two) times daily. Apply to both feet and between toes 04/10/18   [name], MD  torsemide (DEMADEX) 20 MG tablet TAKE 3 TABLETS BY MOUTH DAILY. 03/27/19   [name], MD    Allergies    Sulfa antibiotics and Penicillins  Review of Systems   Review of Systems  Constitutional: Negative for chills and fever.  Eyes: Negative for photophobia and visual disturbance.  Respiratory: Negative for cough and shortness of breath.   Cardiovascular: Negative for chest pain.  Gastrointestinal: Positive for abdominal pain. Negative for constipation, diarrhea, nausea and vomiting.  Endocrine: Positive for polydipsia and polyuria.  Genitourinary: Positive for dysuria.  Musculoskeletal: Negative for neck pain.  Neurological: Positive for headaches. Negative for weakness and numbness.  All other systems reviewed and are negative.   Physical Exam Updated Vital Signs BP (!) 152/71   Pulse 82   Temp 98.2 F (36.8 C) (Oral)   Resp (!) 21   Wt 91.6 kg   SpO2 99%   BMI 29.83 kg/m   Physical Exam Vitals and nursing note reviewed.  Constitutional:      General: He is not in acute distress.    Appearance: He is well-developed.  HENT:     Head: Normocephalic and atraumatic.     Nose: Congestion present.  Eyes:     General:        Right eye: No discharge.        Left eye: No discharge.     Extraocular Movements:  Extraocular movements intact.     Conjunctiva/sclera: Conjunctivae normal.     Pupils: Pupils are  equal, round, and reactive to light.  Neck:     Vascular: No JVD.     Trachea: No tracheal deviation.  Cardiovascular:     Rate and Rhythm: Normal rate and regular rhythm.  Pulmonary:     Effort: Pulmonary effort is normal.  Abdominal:     General: Bowel sounds are normal. There is no distension.     Palpations: Abdomen is soft.     Tenderness: There is abdominal tenderness in the right lower quadrant, suprapubic area and left lower quadrant. There is no guarding or rebound.  Musculoskeletal:     Cervical back: Normal range of motion and neck supple.  Skin:    General: Skin is warm and dry.     Findings: No erythema.  Neurological:     General: No focal deficit present.     Mental Status: He is alert.     Comments: Mental Status:  Alert, thought content appropriate, able to give a coherent history. Speech fluent without evidence of aphasia. Able to follow 2 step commands without difficulty.  Cranial Nerves:  II:  Peripheral visual Bor[place] grossly normal, pupils equal, round, reactive to light III,IV, VI: ptosis not present, extra-ocular motions intact bilaterally  V,VII: smile symmetric, facial light touch sensation equal VIII: hearing grossly normal to voice  X: uvula elevates symmetrically  XI: bilateral shoulder shrug symmetric and strong XII: midline tongue extension without fassiculations Motor:  Normal tone. 5/5 strength of BUE and BLE major muscle groups including strong and equal grip strength and dorsiflexion/plantar flexion Sensory: light touch normal in all extremities.    Psychiatric:        Behavior: Behavior normal.     ED Results / Procedures / Treatments   Labs (all labs ordered are listed, but only abnormal results are displayed) Labs Reviewed  BASIC METABOLIC PANEL - Abnormal; Notable for the following components:      Result Value   Sodium 130 (*)     Chloride 95 (*)    Glucose, Bld 596 (*)    BUN 51 (*)    Creatinine, Ser 1.82 (*)    Calcium 8.3 (*)    GFR calc non Af Amer 39 (*)    GFR calc Af Amer 45 (*)    All other components within normal limits  CBC WITH DIFFERENTIAL/PLATELET - Abnormal; Notable for the following components:   RBC 4.11 (*)    Hemoglobin 11.3 (*)    HCT 34.8 (*)    Monocytes Absolute 1.4 (*)    All other components within normal limits  URINALYSIS, ROUTINE W REFLEX MICROSCOPIC - Abnormal; Notable for the following components:   Color, Urine STRAW (*)    Glucose, UA >=500 (*)    Hgb urine dipstick SMALL (*)    Protein, ur 100 (*)    All other components within normal limits  CARBOXYHEMOGLOBIN - COOX - Abnormal; Notable for the following components:   Carboxyhemoglobin 2.1 (*)    All other components within normal limits  CBG MONITORING, ED - Abnormal; Notable for the following components:   Glucose-Capillary 538 (*)    All other components within normal limits  CBG MONITORING, ED - Abnormal; Notable for the following components:   Glucose-Capillary 491 (*)    All other components within normal limits  CBG MONITORING, ED - Abnormal; Notable for the following components:   Glucose-Capillary 406 (*)    All other components within normal limits  CBG MONITORING, ED - Abnormal; Notable for the following components:  Glucose-Capillary 317 (*)    All other components within normal limits    EKG None  Radiology CT Head Wo Contrast  Result Date: 03/28/2019 CLINICAL DATA:  Blurry vision and dizziness. EXAM: CT HEAD WITHOUT CONTRAST TECHNIQUE: Contiguous axial images were obtained from the base of the skull through the vertex without intravenous contrast. COMPARISON:  May 08, 2015 FINDINGS: Brain: There is mild cerebral atrophy with widening of the extra-axial spaces and ventricular dilatation. There are areas of decreased attenuation within the white matter tracts of the supratentorial brain, consistent with  microvascular disease changes. Vascular: No hyperdense vessel or unexpected calcification. Skull: Normal. Negative for fracture or focal lesion. Sinuses/Orbits: No acute finding. Other: None. IMPRESSION: 1. Generalized cerebral atrophy. 2. No acute intracranial abnormality. Electronically Signed   By: Virgina Norfolk M.D.   On: 03/28/2019 20:10   DG Chest Portable 1 View  Result Date: 03/28/2019 CLINICAL DATA:  Weakness and hyperglycemia. EXAM: PORTABLE CHEST 1 VIEW COMPARISON:  March 07, 2019 FINDINGS: The cardiac silhouette is moderately enlarged and unchanged in size. Both lungs are clear. The visualized skeletal structures are unremarkable. IMPRESSION: Stable cardiomegaly without evidence of acute or active cardiopulmonary disease. Electronically Signed   By: Virgina Norfolk M.D.   On: 03/28/2019 22:15    Procedures Procedures (including critical care time)  Medications Ordered in ED Medications  potassium chloride 10 mEq in 100 mL IVPB ( Intravenous Canceled Entry 03/28/19 2300)  sodium chloride 0.9 % bolus 500 mL (0 mLs Intravenous Stopped 03/28/19 2127)  acetaminophen (TYLENOL) tablet 650 mg (650 mg Oral Given 03/28/19 1935)  insulin aspart (novoLOG) injection 5 Units (5 Units Intravenous Given 03/28/19 2127)  insulin aspart (novoLOG) injection 10 Units (10 Units Intravenous Given 03/28/19 2322)  insulin glargine (LANTUS) injection 55 Units (55 Units Subcutaneous Given 03/28/19 2322)  sodium chloride 0.9 % bolus 500 mL (0 mLs Intravenous Stopped 03/28/19 2300)  potassium chloride 10 mEq in 100 mL IVPB (0 mEq Intravenous Stopped 03/29/19 0030)  prochlorperazine (COMPAZINE) injection 10 mg (10 mg Intravenous Given 03/29/19 0027)  diphenhydrAMINE (BENADRYL) injection 12.5 mg (12.5 mg Intravenous Given 03/29/19 0027)    ED Course  I have reviewed the triage vital signs and the nursing notes.  Pertinent labs & imaging results that were available during my care of the patient were reviewed by me and  considered in my medical decision making (see chart for details).    MDM Rules/Calculators/A&P                      Patient presenting for evaluation of polydipsia, polyuria, left-sided headache, urinary symptoms for 3 days.  Per patient has not been taking his medications but has had enough energy to ambulate at home and eat and drink.  He is afebrile, mildly hypertensive, vital signs otherwise stable.  No focal neurologic deficits on examination but point-of-care CBG elevated at 538.  Concern for possible DKA, will obtain blood work and give IV fluids slowly (as he has a history of CHF and decreased left ventricular ejection fraction).  He has also been using a kerosene heater at home and with complaint of headache we will obtain a carboxyhemoglobin level to rule out carbon monoxide poisoning.  Lab work reviewed by me shows no leukocytosis, anemia at patient's baseline.  He is hyperglycemic but does not appear to be in DKA with CO2 within normal limits and anion gap within normal limits.  His carboxyhemoglobin level is very mildly elevated though not so  elevated to suggest carbon monoxide poisoning.  He is a smoker which could explain his elevated carboxyhemoglobin level.  While in the ED the patient was given IV insulin as well as subcu long-acting insulin.  Blood sugars decreased down to 317 on reassessment.  His chest x-ray shows no evidence of acute cardiopulmonary abnormalities, no edema or consolidation.  His UA does not suggest UTI and is negative for ketones.  Head CT shows no acute intracranial abnormalities, no evidence of hemorrhage or CVA.  Patient's headache was managed in the ED and on reevaluation he is resting comfortably in no apparent distress.  Reports he is feeling much better.  We discussed importance of medication compliance and smoking cessation.  Recommend follow-up with PCP on an outpatient basis.  Discussed strict ED return precautions. Patient verbalized understanding of and  agreement with plan and is safe for discharge home at this time.  Discussed with Dr. Wilson Singer who reviewed workup and agrees with assessment and plan at this time. Final Clinical Impression(s) / ED Diagnoses Final diagnoses:  Hyperglycemia  Bad headache    Rx / DC Orders ED Discharge Orders    None       Debroah Baller 03/29/19 1735    Virgel Manifold, MD 03/29/19 1901

## 2019-03-29 LAB — CBG MONITORING, ED: Glucose-Capillary: 317 mg/dL — ABNORMAL HIGH (ref 70–99)

## 2019-03-29 MED ORDER — DIPHENHYDRAMINE HCL 50 MG/ML IJ SOLN
12.5000 mg | Freq: Once | INTRAMUSCULAR | Status: AC
  Administered 2019-03-29: 12.5 mg via INTRAVENOUS
  Filled 2019-03-29: qty 1

## 2019-03-29 MED ORDER — PROCHLORPERAZINE EDISYLATE 10 MG/2ML IJ SOLN
10.0000 mg | Freq: Once | INTRAMUSCULAR | Status: AC
  Administered 2019-03-29: 10 mg via INTRAVENOUS
  Filled 2019-03-29: qty 2

## 2019-03-29 NOTE — Discharge Instructions (Signed)
Continue to take your medications as prescribed.  Drink plenty of fluids and get rest.  Please continue to check your blood sugars daily.  Follow-up with your PCP for reevaluation of your symptoms and for possible medication management.  Return to the emergency department if any concerning signs or symptoms develop such as high fevers, persistent vomiting, altered mental status, loss of consciousness, or severe pains.

## 2019-03-29 NOTE — ED Notes (Signed)
Pt ambulatory to waiting room. Pt verbalized understanding of discharge instructions.   

## 2019-04-01 ENCOUNTER — Other Ambulatory Visit: Payer: Self-pay | Admitting: *Deleted

## 2019-04-01 ENCOUNTER — Ambulatory Visit (INDEPENDENT_AMBULATORY_CARE_PROVIDER_SITE_OTHER): Payer: Medicare Other | Admitting: Family Medicine

## 2019-04-01 ENCOUNTER — Encounter: Payer: Self-pay | Admitting: Family Medicine

## 2019-04-01 DIAGNOSIS — J019 Acute sinusitis, unspecified: Secondary | ICD-10-CM | POA: Diagnosis not present

## 2019-04-01 DIAGNOSIS — Z794 Long term (current) use of insulin: Secondary | ICD-10-CM

## 2019-04-01 DIAGNOSIS — E1142 Type 2 diabetes mellitus with diabetic polyneuropathy: Secondary | ICD-10-CM

## 2019-04-01 MED ORDER — ONETOUCH VERIO W/DEVICE KIT: 1.0000 | PACK | Freq: Two times a day (BID) | 0 refills | Status: AC

## 2019-04-01 MED ORDER — DOXYCYCLINE HYCLATE 100 MG PO TABS: 100.0000 mg | ORAL_TABLET | Freq: Two times a day (BID) | ORAL | 0 refills | Status: AC

## 2019-04-01 NOTE — Progress Notes (Signed)
Virtual Visit via Telephone Note  I connected with Brad Mendoza on 04/01/19 at 10:23 AM by telephone and verified that I am speaking with the correct person using two identifiers. Brad Mendoza is currently located at home and nobody is currently with him during this visit. The provider, Loman Brooklyn, FNP is located in their home at time of visit.  I discussed the limitations, risks, security and privacy concerns of performing an evaluation and management service by telephone and the availability of in person appointments. I also discussed with the patient that there may be a patient responsible charge related to this service. The patient expressed understanding and agreed to proceed.  Subjective: PCP: [name], MD  Chief Complaint  Patient presents with  . Headache   Patient c/o a headache around his left eye that has been present for the past week. He also reports pain deep in his left ear and left side of his face. Additional symptoms include nasal congestion, facial pressure, and the need to blow his nose. He has taken Tylenol 1,000 mg once which did not provide any relief for his pain. He is unable to take NSAIDs due to stage 4 kidney disease. He was seen in the ER on 03/28/19 for his headache. Their note mentions he has not been taking his insulin but patient reports today he has resumed taking it. He has not been able to check his blood sugars as he does not have a meter. The ER note also mentions patient using a kerosene heater at home but patient reports he is no longer using them.    ROS: Per HPI  Current Outpatient Medications:  .  aspirin EC 81 MG tablet, Take 81 mg by mouth daily., Disp: , Rfl:  .  blood glucose meter kit and supplies KIT, Dispense based on patient and insurance preference. Test four times daily as directed. (FOR ICD-10 : E11.9, Disp: 1 each, Rfl: 0 .  Capsaicin (ICY HOT ARTHRITIS THERAPY EX), Apply 1 application topically 2 (two) times daily as  needed (back pain). , Disp: , Rfl:  .  carvedilol (COREG) 6.25 MG tablet, Take 1 tablet (6.25 mg total) by mouth 2 (two) times daily with a meal., Disp: 180 tablet, Rfl: 1 .  cetirizine (ZYRTEC) 10 MG tablet, Take 1 tablet (10 mg total) by mouth daily. For itching, Disp: 90 tablet, Rfl: 3 .  Cholecalciferol (VITAMIN D3) 25 MCG (1000 UT) CAPS, Take 2,000 Units by mouth daily., Disp: , Rfl:  .  fluticasone (FLONASE) 50 MCG/ACT nasal spray, USE 2 SPRAYS IN EACH NOSTRIL DAILY, Disp: 48 g, Rfl: 1 .  glucose blood (ONETOUCH VERIO) test strip, USE FOUR TIMES DAILY AS NEEDED Dx E11.9, Disp: 100 each, Rfl: 11 .  hydrALAZINE (APRESOLINE) 100 MG tablet, TAKE (1) TABLET BY MOUTH (3) TIMES DAILY., Disp: 90 tablet, Rfl: 0 .  icosapent Ethyl (VASCEPA) 1 g capsule, Take 2 capsules (2 g total) by mouth 2 (two) times daily., Disp: 360 capsule, Rfl: 1 .  Insulin Glargine, 2 Unit Dial, (TOUJEO MAX SOLOSTAR) 300 UNIT/ML SOPN, Inject 55 Units into the skin 2 (two) times daily., Disp: 33 mL, Rfl: 2 .  isosorbide mononitrate (IMDUR) 120 MG 24 hr tablet, TAKE (1) TABLET BY MOUTH ONCE DAILY., Disp: 30 tablet, Rfl: 0 .  Multiple Vitamins-Minerals (CENTRUM SILVER ADULT 50+ PO), Take 1 tablet by mouth daily., Disp: , Rfl:  .  nabumetone (RELAFEN) 500 MG tablet, TAKE TWO TABLETS BY MOUTH TWICE DAILY,  Disp: 360 tablet, Rfl: 1 .  nitroGLYCERIN (NITROSTAT) 0.4 MG SL tablet, DISSOLVE 1 TAB UNDER TOUNGE FOR CHEST PAIN. MAY REPEAT EVERY 5 MINUTES FOR 3 DOSES. IF NO RELIEF CALL 911 OR GO TO ER, Disp: 25 tablet, Rfl: 2 .  Omega-3 Fatty Acids (FISH OIL) 1000 MG CPDR, Take 1,000 mg by mouth daily. , Disp: , Rfl:  .  pantoprazole (PROTONIX) 40 MG tablet, TAKE ONE TABLET BY MOUTH TWICE DAILY., Disp: 60 tablet, Rfl: 0 .  pregabalin (LYRICA) 200 MG capsule, Take 2 capsules (400 mg total) by mouth at bedtime., Disp: 60 capsule, Rfl: 2 .  PROAIR HFA 108 (90 Base) MCG/ACT inhaler, USE 2 PUFFS EVERY 6 HOURS AS NEEDED, Disp: 8.5 g, Rfl: 3 .   ranolazine (RANEXA) 500 MG 12 hr tablet, TAKE 1 TABLET BY MOUTH TWICE DAILY., Disp: 60 tablet, Rfl: 0 .  rivaroxaban (XARELTO) 2.5 MG TABS tablet, Take 1 tablet (2.5 mg total) by mouth 2 (two) times daily., Disp: 60 tablet, Rfl: 6 .  rosuvastatin (CRESTOR) 20 MG tablet, Take 1 tablet (20 mg total) by mouth daily., Disp: 90 tablet, Rfl: 1 .  terbinafine (LAMISIL) 1 % cream, Apply 1 application topically 2 (two) times daily. Apply to both feet and between toes, Disp: 30 g, Rfl: 1 .  torsemide (DEMADEX) 20 MG tablet, TAKE 3 TABLETS BY MOUTH DAILY., Disp: 90 tablet, Rfl: 0  Current Facility-Administered Medications:  .  aflibercept (EYLEA) SOLN 2 mg, 2 mg, Intravitreal, , [name], MD, 2 mg at 04/25/18 0029  Allergies  Allergen Reactions  . Sulfa Antibiotics Shortness Of Breath  . Penicillins Rash and Other (See Comments)    Has patient had a PCN reaction causing immediate rash, facial/tongue/throat swelling, SOB or lightheadedness with hypotension: No Has patient had a PCN reaction causing severe rash involving mucus membranes or skin necrosis: No Has patient had a PCN reaction that required hospitalization No Has patient had a PCN reaction occurring within the last 10 years: No If all of the above answers are "NO", then may proceed with Cephalosporin use.    Past Medical History:  Diagnosis Date  . Acid reflux   . Back pain    f4 and f5  . CAD (coronary artery disease)   . CHF (congestive heart failure) ([place])    last echo was in March 2016  . Diabetes mellitus 2005  . Diverticulosis   . H/O chest pain 2005   ECHO  . HTN (hypertension)   . Hyperlipidemia   . Nonischemic cardiomyopathy ([place])   . Onychomycosis   . Polysubstance abuse ([place])     Observations/Objective: A&O  No respiratory distress or wheezing audible over the phone Mood, judgement, and thought processes all WNL   Assessment and Plan: 1. Acute non-recurrent sinusitis, unspecified location - Treating for  sinus infection due to symptoms. Encouraged patient to continue Tylenol 1,000 mg QID PRN. Avoid all NSAIDs. Discussed COVID-19 but patient states he was positive last month.  - doxycycline (VIBRA-TABS) 100 MG tablet; Take 1 tablet (100 mg total) by mouth 2 (two) times daily for 7 days. 1 po bid  Dispense: 14 tablet; Refill: 0  2. Type 2 diabetes mellitus with diabetic polyneuropathy, with long-term current use of insulin (HCC) - Glucometer sent to pharmacy per patient's request as he no longer has one. Encouraged routine use of insulin.  - Blood Glucose Monitoring Suppl (ONETOUCH VERIO) w/Device KIT; 1 Device by Does not apply route 2 (two) times daily.  Dispense:  1 kit; Refill: 0   Follow Up Instructions:  I discussed the assessment and treatment plan with the patient. The patient was provided an opportunity to ask questions and all were answered. The patient agreed with the plan and demonstrated an understanding of the instructions.   The patient was advised to call back or seek an in-person evaluation if the symptoms worsen or if the condition fails to improve as anticipated.  The above assessment and management plan was discussed with the patient. The patient verbalized understanding of and has agreed to the management plan. Patient is aware to call the clinic if symptoms persist or worsen. Patient is aware when to return to the clinic for a follow-up visit. Patient educated on when it is appropriate to go to the emergency department.   Time call ended: 10:45 AM  I provided 26 minutes of non-face-to-face time during this encounter.  Hendricks Limes, MSN, APRN, FNP-C [facility] 04/01/19

## 2019-04-01 NOTE — Patient Outreach (Signed)
[facility]) Care Management  04/01/2019  JAIVIAN BATTAGLINI [birth year] [id number]   RN Health Coach Monthly Outreach  Referral Date: 12/14/2016 Referral Source: Transfer from [facility] Reason for Referral: Continued Disease Management Education Insurance: [employer]   Outreach Attempt:  Patient has transitioned to working with the [facility] Management Team.  [facility] will close Disease Management program at this time.  Plan:   RN Health Coach will close Disease Management Case as patient is now participating with the Chronic Care Management Program at PCP office. RN Health Coach will send patient case closure letter. RN Health Coach will send primary care provider Discipline Closure Letter.  [place] [phone] [email or web address]

## 2019-04-02 ENCOUNTER — Telehealth: Payer: Self-pay | Admitting: Family Medicine

## 2019-04-02 ENCOUNTER — Encounter (HOSPITAL_COMMUNITY): Payer: Self-pay | Admitting: Emergency Medicine

## 2019-04-02 ENCOUNTER — Other Ambulatory Visit: Payer: Self-pay

## 2019-04-02 ENCOUNTER — Emergency Department (HOSPITAL_COMMUNITY)
Admission: EM | Admit: 2019-04-02 | Discharge: 2019-04-02 | Disposition: A | Discharge status: Home / Self Care | Payer: Medicare Other | Attending: Emergency Medicine | Admitting: Emergency Medicine

## 2019-04-02 ENCOUNTER — Emergency Department (HOSPITAL_COMMUNITY): Payer: Medicare Other

## 2019-04-02 DIAGNOSIS — J0120 Acute ethmoidal sinusitis, unspecified: Secondary | ICD-10-CM | POA: Diagnosis not present

## 2019-04-02 DIAGNOSIS — I2510 Atherosclerotic heart disease of native coronary artery without angina pectoris: Secondary | ICD-10-CM | POA: Diagnosis not present

## 2019-04-02 DIAGNOSIS — Z79899 Other long term (current) drug therapy: Secondary | ICD-10-CM | POA: Diagnosis not present

## 2019-04-02 DIAGNOSIS — Z7982 Long term (current) use of aspirin: Secondary | ICD-10-CM | POA: Diagnosis not present

## 2019-04-02 DIAGNOSIS — R260 Ataxic gait: Secondary | ICD-10-CM | POA: Insufficient documentation

## 2019-04-02 DIAGNOSIS — I5022 Chronic systolic (congestive) heart failure: Secondary | ICD-10-CM | POA: Insufficient documentation

## 2019-04-02 DIAGNOSIS — I130 Hypertensive heart and chronic kidney disease with heart failure and stage 1 through stage 4 chronic kidney disease, or unspecified chronic kidney disease: Secondary | ICD-10-CM | POA: Diagnosis not present

## 2019-04-02 DIAGNOSIS — R202 Paresthesia of skin: Secondary | ICD-10-CM | POA: Insufficient documentation

## 2019-04-02 DIAGNOSIS — N184 Chronic kidney disease, stage 4 (severe): Secondary | ICD-10-CM | POA: Insufficient documentation

## 2019-04-02 DIAGNOSIS — R200 Anesthesia of skin: Secondary | ICD-10-CM | POA: Diagnosis not present

## 2019-04-02 DIAGNOSIS — E1122 Type 2 diabetes mellitus with diabetic chronic kidney disease: Secondary | ICD-10-CM | POA: Diagnosis not present

## 2019-04-02 DIAGNOSIS — Z794 Long term (current) use of insulin: Secondary | ICD-10-CM | POA: Insufficient documentation

## 2019-04-02 DIAGNOSIS — J322 Chronic ethmoidal sinusitis: Secondary | ICD-10-CM | POA: Diagnosis not present

## 2019-04-02 LAB — COMPREHENSIVE METABOLIC PANEL
ALT: 19 U/L (ref 0–44)
AST: 19 U/L (ref 15–41)
Albumin: 2.3 g/dL — ABNORMAL LOW (ref 3.5–5.0)
Alkaline Phosphatase: 116 U/L (ref 38–126)
Anion gap: 11 (ref 5–15)
BUN: 37 mg/dL — ABNORMAL HIGH (ref 8–23)
CO2: 24 mmol/L (ref 22–32)
Calcium: 8.2 mg/dL — ABNORMAL LOW (ref 8.9–10.3)
Chloride: 102 mmol/L (ref 98–111)
Creatinine, Ser: 2.11 mg/dL — ABNORMAL HIGH (ref 0.61–1.24)
GFR calc Af Amer: 37 mL/min — ABNORMAL LOW (ref >60)
GFR calc non Af Amer: 32 mL/min — ABNORMAL LOW (ref >60)
Glucose, Bld: 98 mg/dL (ref 70–99)
Potassium: 3.4 mmol/L — ABNORMAL LOW (ref 3.5–5.1)
Sodium: 137 mmol/L (ref 135–145)
Total Bilirubin: 0.7 mg/dL (ref 0.3–1.2)
Total Protein: 6 g/dL — ABNORMAL LOW (ref 6.5–8.1)

## 2019-04-02 LAB — CBC
HCT: 33 % — ABNORMAL LOW (ref 39.0–52.0)
Hemoglobin: 10.7 g/dL — ABNORMAL LOW (ref 13.0–17.0)
MCH: 27.8 pg (ref 26.0–34.0)
MCHC: 32.4 g/dL (ref 30.0–36.0)
MCV: 85.7 fL (ref 80.0–100.0)
Platelets: 419 10*3/uL — ABNORMAL HIGH (ref 150–400)
RBC: 3.85 MIL/uL — ABNORMAL LOW (ref 4.22–5.81)
RDW: 13.6 % (ref 11.5–15.5)
WBC: 11.9 10*3/uL — ABNORMAL HIGH (ref 4.0–10.5)
nRBC: 0 % (ref 0.0–0.2)

## 2019-04-02 LAB — TSH: TSH: 1.969 u[IU]/mL (ref 0.350–4.500)

## 2019-04-02 LAB — CBG MONITORING, ED: Glucose-Capillary: 80 mg/dL (ref 70–99)

## 2019-04-02 MED ORDER — CLINDAMYCIN HCL 150 MG PO CAPS
150.0000 mg | ORAL_CAPSULE | Freq: Once | ORAL | Status: AC
  Administered 2019-04-02: 150 mg via ORAL
  Filled 2019-04-02: qty 1

## 2019-04-02 MED ORDER — CLINDAMYCIN HCL 150 MG PO CAPS: 150.0000 mg | ORAL_CAPSULE | Freq: Four times a day (QID) | ORAL | 0 refills | Status: DC

## 2019-04-02 NOTE — Telephone Encounter (Signed)
Patient aware.

## 2019-04-02 NOTE — Telephone Encounter (Signed)
What symptoms do you have? Nose on the left side is numb. Stomach on left side is hurting and can't rest. Had televisit 2-8 with Britney  How long have you been sick? This morning  Have you been seen for this problem? Had televisit with Britney 2-8  If your provider decides to give you a prescription, which pharmacy would you like for it to be sent [facility]   Patient informed that this information will be sent to the clinical staff for review and that they should receive a follow up call.

## 2019-04-02 NOTE — Telephone Encounter (Signed)
Needs to go to ED for head scan

## 2019-04-02 NOTE — Telephone Encounter (Signed)
Patient states his nose is swollen and numb, has a really bad headache for the last 2-3 days. He states the pain is a 10 out of 10 and is unable to stand the pain, feet are numb and tingling, and left side pain. Bp is 123/58 p 93

## 2019-04-02 NOTE — ED Notes (Addendum)
Per registration, pt reporting facial swelling and intense left side pain. ED NT obtained updated set of vital signs. Pt taken to treatment room.

## 2019-04-02 NOTE — Discharge Instructions (Addendum)
CT scan of your head showed an infection in your sinuses.  We will start an appropriate antibiotic.  The numbness and tingling in your hands and feet is most likely related to diabetic neuropathy.  Follow-up with your primary care doctor.

## 2019-04-02 NOTE — ED Provider Notes (Addendum)
[facility] Provider Note   CSN: [id number] Arrival date & time: 04/02/19  1419     History Chief Complaint  Patient presents with  . Numbness    Brad Mendoza is a 64 y.o. male.  Numbness and tingling in peri mid central facial area, bilateral hands and bilateral feet for 2 to 3 days.  He is eating and walking around the house.  He says he has been a little bit off balance.  Primary care prescribed him an unknown medication for a "sinus problem".  Past medical history includes CAD, CHF, diabetes, hyperlipidemia, cardiomyopathy, several others.  Severity of symptoms is moderate.  Nothing makes symptoms better or worse.        Past Medical History:  Diagnosis Date  . Acid reflux   . Back pain    f4 and f5  . CAD (coronary artery disease)   . CHF (congestive heart failure) ([place])    last echo was in March 2016  . Diabetes mellitus 2005  . Diverticulosis   . H/O chest pain 2005   ECHO  . HTN (hypertension)   . Hyperlipidemia   . Nonischemic cardiomyopathy ([place])   . Onychomycosis   . Polysubstance abuse [facility])     Patient Active Problem List   Diagnosis Date Noted  . Lumbar radiculopathy 03/22/2019  . Imbalance 03/22/2019  . Chronic kidney disease, stage IV (severe) ([place]) 02/11/2019  . Acute on chronic systolic (congestive) heart failure ([place]) 03/19/2018  . 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 Onychomycosis 05/23/2017  . Acid reflux 05/23/2017  . Cardiomyopathy ([place]) 05/23/2017  . Anginal pain ([place]) 06/01/2016  . Angina pectoris ([place]) 06/01/2016  . CHF exacerbation ([place]) 11/17/2015  . Congestive heart failure ([place]) 11/17/2015  . Chronic combined systolic and diastolic heart failure ([place]) 11/16/2015  . Type 2 diabetes mellitus with hyperosmolar nonketotic hyperglycemia ([place]) 05/08/2015  . Hyponatremia 05/08/2015  . Hypochloremia 05/08/2015  . Type 2 diabetes mellitus with other specified complication ([place]) 34/74/2595  . Type 2 diabetes mellitus with  diabetic polyneuropathy ([place]) 12/16/2013  . Type 2 diabetes mellitus ([place]) 12/16/2013  . Coronary artery disease involving native coronary artery of native heart with angina pectoris ([place]) 09/18/2013  . Coronary arteriosclerosis 09/18/2013  . Heme positive stool 06/12/2013  . Occult blood in stools 06/12/2013  . Seasonal allergic rhinitis 07/11/2012  . Polysubstance abuse ([place]) 12/24/2010  . Nonischemic cardiomyopathy ([place])   . Diabetic retinopathy of both eyes associated with type 2 diabetes mellitus ([place])   . Hyperlipemia 11/16/2009  . Obesity 11/16/2009  . Tobacco dependence 11/16/2009  . Essential hypertension 11/16/2009  . Hyperlipidemia 11/16/2009  . Tobacco dependence syndrome 11/16/2009    Past Surgical History:  Procedure Laterality Date  . APPENDECTOMY    . CARDIAC CATHETERIZATION  2005   4 frech cath  . COLONOSCOPY N/A 08/01/2013   Procedure: COLONOSCOPY;  Surgeon: Rogene Houston, MD;  [facility];  Service: Endoscopy;  Laterality: N/A;  rescheduled to [name] notified pt  . CORONARY STENT INTERVENTION N/A 03/19/2018   Procedure: CORONARY STENT INTERVENTION;  Surgeon: Leonie Man, MD;  Location: [facility] CV LAB;  Service: Cardiovascular;  Laterality: N/A;  . ESOPHAGOGASTRODUODENOSCOPY N/A 04/23/2015   Procedure: ESOPHAGOGASTRODUODENOSCOPY (EGD);  Surgeon: Rogene Houston, MD;  Location: [facility];  Service: Endoscopy;  Laterality: N/A;  1:25  . RIGHT/LEFT HEART CATH AND CORONARY ANGIOGRAPHY N/A 06/03/2016   Procedure: Right/Left Heart Cath and Coronary Angiography;  Surgeon: Belva Crome,  MD;  Location: [facility] LAB;  Service: Cardiovascular;  Laterality: N/A;  . RIGHT/LEFT HEART CATH AND CORONARY ANGIOGRAPHY N/A 03/19/2018   Procedure: RIGHT/LEFT HEART CATH AND CORONARY ANGIOGRAPHY;  Surgeon: Jolaine Artist, MD;  Location: [facility] LAB;  Service: Cardiovascular;  Laterality: N/A;       Family History  Problem Relation Age of  Onset  . Hypertension Father   . Diabetes Father   . Cancer Mother   . Alcohol abuse Brother   . Colon cancer Neg Hx     Social History   Tobacco Use  . Smoking status: Never Smoker  . Smokeless tobacco: Current User    Types: Snuff  . Tobacco comment: dips snuff about 6 cans/week for 3 years  Substance Use Topics  . Alcohol use: Not Currently    Alcohol/week: 1.0 standard drinks    Types: 1 Cans of beer per week    Comment: occ - has cut down, now once every 2-3 months  . Drug use: Yes    Types: Marijuana    Comment: occasional use - smoking less    Home Medications Prior to Admission medications   Medication Sig Start Date End Date Taking? Authorizing Provider  aspirin EC 81 MG tablet Take 81 mg by mouth daily.    [provider]  blood glucose meter kit and supplies KIT Dispense based on patient and insurance preference. Test four times daily as directed. (FOR ICD-10 : E11.9 03/21/19   [name], MD  Blood Glucose Monitoring Suppl (ONETOUCH VERIO) w/Device KIT 1 Device by Does not apply route 2 (two) times daily. 04/01/19   Loman Brooklyn, FNP  Capsaicin (ICY HOT ARTHRITIS THERAPY EX) Apply 1 application topically 2 (two) times daily as needed (back pain).     [provider]  carvedilol (COREG) 6.25 MG tablet Take 1 tablet (6.25 mg total) by mouth 2 (two) times daily with a meal. 02/04/19   Larey Dresser, MD  cetirizine (ZYRTEC) 10 MG tablet Take 1 tablet (10 mg total) by mouth daily. For itching 09/04/18   [name], MD  Cholecalciferol (VITAMIN D3) 25 MCG (1000 UT) CAPS Take 2,000 Units by mouth daily.    [provider]  clindamycin (CLEOCIN) 150 MG capsule Take 1 capsule (150 mg total) by mouth every 6 (six) hours. 04/02/19   Nat Christen, MD  doxycycline (VIBRA-TABS) 100 MG tablet Take 1 tablet (100 mg total) by mouth 2 (two) times daily for 7 days. 1 po bid 04/01/19 04/08/19  Loman Brooklyn, FNP  fluticasone [facility]) 50 MCG/ACT nasal  spray USE 2 SPRAYS IN [facility] NOSTRIL DAILY 09/18/18   Terald Sleeper, PA-C  glucose blood (ONETOUCH VERIO) test strip USE FOUR TIMES DAILY AS NEEDED Dx E11.9 09/20/18   [name], MD  hydrALAZINE (APRESOLINE) 100 MG tablet TAKE (1) TABLET BY MOUTH (3) TIMES DAILY. 03/27/19   [name], MD  icosapent Ethyl (VASCEPA) 1 g capsule Take 2 capsules (2 g total) by mouth 2 (two) times daily. 02/04/19   Larey Dresser, MD  Insulin Glargine, 2 Unit Dial, (TOUJEO MAX SOLOSTAR) 300 UNIT/ML SOPN Inject 55 Units into the skin 2 (two) times daily. 02/12/19   [name], MD  isosorbide mononitrate (IMDUR) 120 MG 24 hr tablet TAKE (1) TABLET BY MOUTH ONCE DAILY. 03/27/19   [name], MD  Multiple Vitamins-Minerals (CENTRUM SILVER ADULT 50+ PO) Take 1 tablet by mouth daily.    [provider]  nabumetone (RELAFEN) 500 MG tablet TAKE TWO TABLETS BY MOUTH TWICE DAILY 09/18/18   [name], MD  nitroGLYCERIN (NITROSTAT) 0.4 MG SL tablet DISSOLVE 1 TAB UNDER TOUNGE FOR CHEST PAIN. MAY REPEAT EVERY 5 MINUTES FOR 3 DOSES. IF NO RELIEF CALL 911 OR GO TO ER 09/20/18   [name], MD  Omega-3 Fatty Acids (FISH OIL) 1000 MG CPDR Take 1,000 mg by mouth daily.     [provider]  pantoprazole (PROTONIX) 40 MG tablet TAKE ONE TABLET BY MOUTH TWICE DAILY. 03/27/19   [name], MD  pregabalin (LYRICA) 200 MG capsule Take 2 capsules (400 mg total) by mouth at bedtime. 03/18/19   [name], MD  PROAIR HFA 108 [phone] Base) MCG/ACT inhaler USE 2 PUFFS EVERY 6 HOURS AS NEEDED 09/18/18   [name], MD  ranolazine (RANEXA) 500 MG 12 hr tablet TAKE 1 TABLET BY MOUTH TWICE DAILY. 03/27/19   Bensimhon, Shaune Pascal, MD  rivaroxaban (XARELTO) 2.5 MG TABS tablet Take 1 tablet (2.5 mg total) by mouth 2 (two) times daily. 02/27/19   Herminio Commons, MD  rosuvastatin (CRESTOR) 20 MG tablet Take 1 tablet (20 mg total) by mouth daily. 02/04/19   Larey Dresser, MD  terbinafine (LAMISIL) 1 % cream Apply 1  application topically 2 (two) times daily. Apply to both feet and between toes 04/10/18   [name], MD  torsemide (DEMADEX) 20 MG tablet TAKE 3 TABLETS BY MOUTH DAILY. 03/27/19   [name], MD    Allergies    Sulfa antibiotics and Penicillins  Review of Systems   Review of Systems  All other systems reviewed and are negative.   Physical Exam Updated Vital Signs BP (!) 148/80   Pulse 87   Temp 98.2 F (36.8 C) (Oral)   Resp 17   Ht _0  (1.753 m)   Wt 91.6 kg   SpO2 98%   BMI 29.83 kg/m   Physical Exam Vitals and nursing note reviewed.  Constitutional:      Appearance: He is well-developed.     Comments: nad  HENT:     Head: Normocephalic and atraumatic.     Comments: Moves facial muscles appropriately. Eyes:     Conjunctiva/sclera: Conjunctivae normal.  Cardiovascular:     Rate and Rhythm: Normal rate and regular rhythm.  Pulmonary:     Effort: Pulmonary effort is normal.     Breath sounds: Normal breath sounds.  Abdominal:     General: Bowel sounds are normal.     Palpations: Abdomen is soft.  Musculoskeletal:        General: Normal range of motion.     Cervical back: Neck supple.  Skin:    General: Skin is warm and dry.  Neurological:     General: No focal deficit present.     Mental Status: He is alert and oriented to person, place, and time.     Comments: Moves arms and legs without deficit.  Alert and oriented x3.  Psychiatric:        Behavior: Behavior normal.     ED Results / Procedures / Treatments   Labs (all labs ordered are listed, but only abnormal results are displayed) Labs Reviewed  CBC - Abnormal; Notable for the following components:      Result Value   WBC 11.9 (*)    RBC 3.85 (*)    Hemoglobin 10.7 (*)    HCT 33.0 (*)    Platelets 419 (*)  All other components within normal limits  COMPREHENSIVE METABOLIC PANEL - Abnormal; Notable for the following components:   Potassium 3.4 (*)    BUN 37 (*)    Creatinine, Ser  2.11 (*)    Calcium 8.2 (*)    Total Protein 6.0 (*)    Albumin 2.3 (*)    GFR calc non Af Amer 32 (*)    GFR calc Af Amer 37 (*)    All other components within normal limits  TSH  CBG MONITORING, ED  CBG MONITORING, ED    EKG EKG Interpretation  Date/Time:  Tuesday April 02 2019 14:26:30 EST Ventricular Rate:  103 PR Interval:  156 QRS Duration: 86 QT Interval:  402 QTC Calculation: 526 R Axis:   -30 Text Interpretation: Sinus tachycardia Left axis deviation Inferior infarct , age undetermined Prolonged QT Abnormal ECG Confirmed by Veryl Speak [phone]) on 04/02/2019 2:37:29 PM   Radiology CT Head Wo Contrast  Result Date: 04/02/2019 CLINICAL DATA:  Peripheral numbness bilaterally with questionable ataxia EXAM: CT HEAD WITHOUT CONTRAST TECHNIQUE: Contiguous axial images were obtained from the base of the skull through the vertex without intravenous contrast. COMPARISON:  March 28, 2019 FINDINGS: Brain: Ventricles and sulci are upper normal in size for age, stable. Configuration normal. There is no intracranial mass, hemorrhage, extra-axial fluid collection, or midline shift. Brain parenchyma appears unremarkable. No evident acute infarct. Vascular: No hyperdense vessels. No appreciable vascular calcification. Skull: Bony calvarium appears intact. Sinuses/Orbits: There is opacification in multiple ethmoid air cells bilaterally. Visualized orbits appear symmetric bilaterally. Other: Visualized mastoid air cells are clear. IMPRESSION: Normal appearing brain parenchyma. No acute infarct. No mass or hemorrhage. Multifocal ethmoid sinus disease bilaterally, more severe on the left than on the right. Electronically Signed   By: Lowella Grip III M.D.   On: 04/02/2019 16:00    Procedures Procedures (including critical care time)  Medications Ordered in ED Medications  clindamycin (CLEOCIN) capsule 150 mg (has no administration in time range)    ED Course  I have reviewed the  triage vital signs and the nursing notes.  Pertinent labs & imaging results that were available during my care of the patient were reviewed by me and considered in my medical decision making (see chart for details).    MDM Rules/Calculators/A&P                      Uncertain etiology of patient's symptom complex.  He does not appear to have any gross neurological deficits on neuro exam.  Will obtain basic labs, TSH, CT head.  1945: Recheck.  No neuro deficits.  Discussed ethmoid sinusitis.  Creatinine has been elevated in the past.  Discharge medication clindamycin 150 mg.  Will follow up with primary care. Final Clinical Impression(s) / ED Diagnoses Final diagnoses:  Numbness and tingling  Ethmoid sinusitis, unspecified chronicity    Rx / DC Orders ED Discharge Orders         Ordered    clindamycin (CLEOCIN) 150 MG capsule  Every 6 hours     04/02/19 1951           Nat Christen, MD 04/02/19 1541    Nat Christen, MD 04/02/19 [phone]

## 2019-04-02 NOTE — ED Triage Notes (Signed)
Pt reports right sided of nose numbness, tingling in right hand and bilateral feet. Pt reports symptoms started 2 days ago. Pt reports sent from providers office. Pt reports started on sinus medication with no relief of symptoms. nad noted. Pt alert and oriented, speech clear, facial symmetry noted.

## 2019-04-04 ENCOUNTER — Institutional Professional Consult (permissible substitution): Payer: Medicare Other | Admitting: Internal Medicine

## 2019-04-05 ENCOUNTER — Other Ambulatory Visit (HOSPITAL_COMMUNITY): Payer: Self-pay | Admitting: Internal Medicine

## 2019-04-10 ENCOUNTER — Institutional Professional Consult (permissible substitution): Payer: Medicare Other | Admitting: Internal Medicine

## 2019-04-12 ENCOUNTER — Ambulatory Visit (INDEPENDENT_AMBULATORY_CARE_PROVIDER_SITE_OTHER): Payer: Medicare Other | Admitting: Family Medicine

## 2019-04-12 DIAGNOSIS — R519 Headache, unspecified: Secondary | ICD-10-CM

## 2019-04-12 MED ORDER — LORATADINE 10 MG PO TABS: 10.0000 mg | ORAL_TABLET | Freq: Every day | ORAL | 11 refills | Status: AC

## 2019-04-12 NOTE — Patient Instructions (Signed)
It appears that you have a viral upper respiratory infection (cold).  Cold symptoms can last up to 2 weeks.  I recommend that you only use cold medications that are safe in high blood pressure like Coricidin (generic is fine).  Other cold medications can increase your blood pressure.    - Get plenty of rest and drink plenty of fluids. - Try to breathe moist air. Use a cold mist humidifier. - Consume warm fluids (soup or tea) to provide relief for a stuffy nose and to loosen phlegm. - For nasal stuffiness, try saline nasal spray or a Neti Pot.  Afrin nasal spray can also be used but this product should not be used longer than 3 days or it will cause rebound nasal stuffiness (worsening nasal congestion). - For sore throat pain relief: use chloraseptic spray, suck on throat lozenges, hard candy or popsicles; gargle with warm salt water (1/4 tsp. salt per 8 oz. of water); and eat soft, bland foods. - Eat a well-balanced diet. If you cannot, ensure you are getting enough nutrients by taking a daily multivitamin. - Avoid dairy products, as they can thicken phlegm. - Avoid alcohol, as it impairs your body's immune system.  CONTACT YOUR DOCTOR IF YOU EXPERIENCE ANY OF THE FOLLOWING: - High fever - Ear pain - Sinus-type headache - Unusually severe cold symptoms - Cough that gets worse while other cold symptoms improve - Flare up of any chronic lung problem, such as asthma - Your symptoms persist longer than 2 weeks  

## 2019-04-12 NOTE — Progress Notes (Signed)
Telephone visit  Subjective: CC: headache/ sinus PCP: [name], MD Brad Mendoza is a 64 y.o. male calls for telephone consult today. Patient provides verbal consent for consult held via phone.  Due to COVID-19 pandemic this visit was conducted virtually. This visit type was conducted due to national recommendations for restrictions regarding the COVID-19 Pandemic (e.g. social distancing, sheltering in place) in an effort to limit this patient's exposure and mitigate transmission in our community. All issues noted in this document were discussed and addressed.  A physical exam was not performed with this format.   Location of patient: home Location of provider: Working remotely from home Others present for call: none  1. Headache/ sinus issue Patient reports he developed sinus headache.  He was prescribed Claritin in the ED and he notes improvement in symptoms.  He has been using Flonase as well.  Has not used anything OTC.  He notes that he was recently diagnosed with COVID-19 in the last month or so.  No fevers, shortness of breath, purulence from nares.  No visual disturbance or balance changes.   ROS: Per HPI  Allergies  Allergen Reactions  . Sulfa Antibiotics Shortness Of Breath  . Penicillins Rash and Other (See Comments)    Has patient had a PCN reaction causing immediate rash, facial/tongue/throat swelling, SOB or lightheadedness with hypotension: No Has patient had a PCN reaction causing severe rash involving mucus membranes or skin necrosis: No Has patient had a PCN reaction that required hospitalization No Has patient had a PCN reaction occurring within the last 10 years: No If all of the above answers are "NO", then may proceed with Cephalosporin use.    Past Medical History:  Diagnosis Date  . Acid reflux   . Back pain    f4 and f5  . CAD (coronary artery disease)   . CHF (congestive heart failure) ([place])    last echo was in March 2016  . Diabetes  mellitus 2005  . Diverticulosis   . H/O chest pain 2005   ECHO  . HTN (hypertension)   . Hyperlipidemia   . Nonischemic cardiomyopathy ([place])   . Onychomycosis   . Polysubstance abuse ([place])     Current Outpatient Medications:  .  aspirin EC 81 MG tablet, Take 81 mg by mouth daily., Disp: , Rfl:  .  blood glucose meter kit and supplies KIT, Dispense based on patient and insurance preference. Test four times daily as directed. (FOR ICD-10 : E11.9, Disp: 1 each, Rfl: 0 .  Blood Glucose Monitoring Suppl (ONETOUCH VERIO) w/Device KIT, 1 Device by Does not apply route 2 (two) times daily., Disp: 1 kit, Rfl: 0 .  Capsaicin (ICY HOT ARTHRITIS THERAPY EX), Apply 1 application topically 2 (two) times daily as needed (back pain). , Disp: , Rfl:  .  carvedilol (COREG) 6.25 MG tablet, Take 1 tablet (6.25 mg total) by mouth 2 (two) times daily with a meal., Disp: 180 tablet, Rfl: 1 .  cetirizine (ZYRTEC) 10 MG tablet, Take 1 tablet (10 mg total) by mouth daily. For itching, Disp: 90 tablet, Rfl: 3 .  Cholecalciferol (VITAMIN D3) 25 MCG (1000 UT) CAPS, Take 2,000 Units by mouth daily., Disp: , Rfl:  .  clindamycin (CLEOCIN) 150 MG capsule, Take 1 capsule (150 mg total) by mouth every 6 (six) hours., Disp: 28 capsule, Rfl: 0 .  fluticasone (FLONASE) 50 MCG/ACT nasal spray, USE 2 SPRAYS IN EACH NOSTRIL DAILY, Disp: 48 g, Rfl: 1 .  glucose blood (  ONETOUCH VERIO) test strip, USE FOUR TIMES DAILY AS NEEDED Dx E11.9, Disp: 100 each, Rfl: 11 .  hydrALAZINE (APRESOLINE) 100 MG tablet, TAKE (1) TABLET BY MOUTH (3) TIMES DAILY., Disp: 90 tablet, Rfl: 0 .  icosapent Ethyl (VASCEPA) 1 g capsule, Take 2 capsules (2 g total) by mouth 2 (two) times daily., Disp: 360 capsule, Rfl: 1 .  Insulin Glargine, 2 Unit Dial, (TOUJEO MAX SOLOSTAR) 300 UNIT/ML SOPN, Inject 55 Units into the skin 2 (two) times daily., Disp: 33 mL, Rfl: 2 .  isosorbide mononitrate (IMDUR) 120 MG 24 hr tablet, TAKE (1) TABLET BY MOUTH ONCE DAILY., Disp:  30 tablet, Rfl: 0 .  Multiple Vitamins-Minerals (CENTRUM SILVER ADULT 50+ PO), Take 1 tablet by mouth daily., Disp: , Rfl:  .  nabumetone (RELAFEN) 500 MG tablet, TAKE TWO TABLETS BY MOUTH TWICE DAILY, Disp: 360 tablet, Rfl: 1 .  nitroGLYCERIN (NITROSTAT) 0.4 MG SL tablet, DISSOLVE 1 TAB UNDER TOUNGE FOR CHEST PAIN. MAY REPEAT EVERY 5 MINUTES FOR 3 DOSES. IF NO RELIEF CALL 911 OR GO TO ER, Disp: 25 tablet, Rfl: 2 .  Omega-3 Fatty Acids (FISH OIL) 1000 MG CPDR, Take 1,000 mg by mouth daily. , Disp: , Rfl:  .  pantoprazole (PROTONIX) 40 MG tablet, TAKE ONE TABLET BY MOUTH TWICE DAILY., Disp: 60 tablet, Rfl: 0 .  pregabalin (LYRICA) 200 MG capsule, Take 2 capsules (400 mg total) by mouth at bedtime., Disp: 60 capsule, Rfl: 2 .  PROAIR HFA 108 (90 Base) MCG/ACT inhaler, USE 2 PUFFS EVERY 6 HOURS AS NEEDED, Disp: 8.5 g, Rfl: 3 .  ranolazine (RANEXA) 500 MG 12 hr tablet, TAKE 1 TABLET BY MOUTH TWICE DAILY., Disp: 60 tablet, Rfl: 0 .  rivaroxaban (XARELTO) 2.5 MG TABS tablet, Take 1 tablet (2.5 mg total) by mouth 2 (two) times daily., Disp: 60 tablet, Rfl: 6 .  rosuvastatin (CRESTOR) 20 MG tablet, Take 1 tablet (20 mg total) by mouth daily., Disp: 90 tablet, Rfl: 1 .  terbinafine (LAMISIL) 1 % cream, Apply 1 application topically 2 (two) times daily. Apply to both feet and between toes, Disp: 30 g, Rfl: 1 .  torsemide (DEMADEX) 20 MG tablet, TAKE 3 TABLETS BY MOUTH DAILY., Disp: 90 tablet, Rfl: 0  Current Facility-Administered Medications:  .  aflibercept (EYLEA) SOLN 2 mg, 2 mg, Intravitreal, , [name], MD, 2 mg at 04/25/18 0029  Assessment/ Plan: 64 y.o. male   1. Sinus headache Nothing to suggest bacterial infection at this time given resolution with Claritin previously.  I have renewed this medication for him instructed him to continue Flonase.  If he has ongoing sinus congestion, could consider addition of Coricidin.  Sinus rinses encouraged.  Red flags discussed.  Reasons for reevaluation  discussed.  Patient voiced good understanding and will follow up as needed - loratadine (CLARITIN) 10 MG tablet; Take 1 tablet (10 mg total) by mouth daily.  Dispense: 30 tablet; Refill: 11   Start time: 9:27am End time: 9:33am  Total time spent on patient care (including telephone call/ virtual visit): 12 minutes  [place], [place] [phone]

## 2019-04-16 ENCOUNTER — Ambulatory Visit (INDEPENDENT_AMBULATORY_CARE_PROVIDER_SITE_OTHER): Payer: Medicare Other | Admitting: *Deleted

## 2019-04-16 ENCOUNTER — Institutional Professional Consult (permissible substitution): Payer: Medicare Other | Admitting: Internal Medicine

## 2019-04-16 DIAGNOSIS — R6889 Other general symptoms and signs: Secondary | ICD-10-CM | POA: Diagnosis not present

## 2019-04-16 DIAGNOSIS — I10 Essential (primary) hypertension: Secondary | ICD-10-CM | POA: Diagnosis not present

## 2019-04-16 DIAGNOSIS — J019 Acute sinusitis, unspecified: Secondary | ICD-10-CM

## 2019-04-16 DIAGNOSIS — Z794 Long term (current) use of insulin: Secondary | ICD-10-CM | POA: Diagnosis not present

## 2019-04-16 DIAGNOSIS — E1142 Type 2 diabetes mellitus with diabetic polyneuropathy: Secondary | ICD-10-CM | POA: Diagnosis not present

## 2019-04-16 NOTE — Patient Instructions (Signed)
Visit Information  Goals Addressed            This Visit's Progress     Patient Stated   . "I would like to get a cane" (pt-stated)       Current Barriers:  [name] Knowledge Deficits related to how to obtain a cane . Film/video editor.   Nurse Case Manager Clinical Goal(s):  [name] Over the next 30 days, patient will work with PCP office to obtain order for single point cane  Interventions:  . Previously discussed patients desire/need for a cane. He would like a single point cane from [place] . Chart reviewed, including recent office notes o Order for cane sent to [employer] . Talked with patient by telephone. States that he will pick it up at the first of the month.   Patient Self Care Activities:  . Performs ADL's independently . Performs IADL's independently  Please see past updates related to this goal by clicking on the "Past Updates" button in the selected goal        Other   . Blood Pressure Management       CARE PLAN ENTRY (see longtitudinal plan of care for additional care plan information)  Current Barriers:  . Chronic Disease Management support and education needs related to hypertension  Nurse Case Manager Clinical Goal(s):  [name] Over the next 30 days, patient will work with Consulting civil engineer to address needs related to hypertension . Over the next 90 days, the patient will demonstrate ongoing self health care management ability as evidenced by checking and recording blood pressure daily*  Interventions:  . Evaluation of current treatment plan related to hypertension and patient's adherence to plan as established by provider. . Reviewed medications with patient and discussed hydralazine and coreg . Discussed plans with patient for ongoing care management follow up and provided patient with direct contact information for care management team . Advised patient, providing education and rationale, to monitor blood pressure daily and record, calling  [phone] for findings outside established parameters.   Patient Self Care Activities:  . Performs ADL's independently . Performs IADL's independently   Initial goal documentation     . Blood Sugar Management       Current Barriers:  . Film/video editor.  . Chronic Disease Management support and education needs related to diabetes  Nurse Case Manager Clinical Goal(s):  [name] Over the next 30 days, patient will work with Consulting civil engineer to address needs related to diabetes management  Interventions:  . Evaluation of current treatment plan related to diabetes management and patient's adherence to plan as established by provider. . Advised patient to check and record blood sugar daily as directed . Reviewed medications with patient  . Discussed plans with patient for ongoing care management follow up and provided patient with direct contact information for care management team . Previously discussed need for new blood sugar meter. Order was sent in to [facility] and patient was able to pick that up. He has been using it regularly without any problems.  . Discussed recent blood sugar readings. Patient reports reading of 147 this morning fasting. No hypoglycemic events.  . Encouraged patient to reach out to PCP at [phone] with any readings outside of recommended range  Patient Self Care Activities:  . Performs ADL's independently . Performs IADL's independently   Please see past updates related to this goal by clicking on the "Past Updates" button in the selected goal      .  Sinusitis       CARE PLAN ENTRY (see longtitudinal plan of care for additional care plan information)  Current Barriers:  [name] Knowledge Deficits related to treatment of sinusitis  Nurse Case Manager Clinical Goal(s):  [name] Over the next 10 days, patient will have resolution of sinusitis symptoms  Interventions:  . Chart reviewed including recent hospital ED notes and office  notes . Medications reviewed and discussed with patient . Discussed HPI o Nasal congestion and feeling of fullness maxillary sinuses Left side. Some post nasal drainage and burning sensation in sinuses at night when lying down. He cannot blow anything out of his nose.  . Reviewed recent treatment recommendations with patient: coricidin, nasal saline rinses with distilled water, Flonase nasal pray, and Claritin . Discussed heat source in home. Using electric heaters now. Has d/c use of kerosine heaters. Discussed that air is likely dry and that saline nasal rinses and/or sprays and a humidifier will help moisturize nasal and sinus passages . Advised patient to reach out to PCP if symptoms are not improving over the next few days or if at any point his symptoms worsen  Patient Self Care Activities:  . Patient verbalizes understanding of plan to sinusitis . Performs ADL's independently . Performs IADL's independently  Initial goal documentation       The care management team will reach out to the patient again over the next 14 days.    Chong Sicilian, BSN, RN-BC Embedded Chronic Care Manager [facility] [place] [facility] Direct Dial: [phone]   The patient verbalized understanding of instructions provided today and declined a print copy of patient instruction materials.

## 2019-04-16 NOTE — Chronic Care Management (AMB) (Signed)
Chronic Care Management   Follow Up Note   04/16/2019 Name: GRAYSON PFEFFERLE MRN: [id number] DOB: May 10, 1955  Referred by: [name], MD Reason for referral : Chronic Care Management (RN Follow-up)   SHAIL URBAS is a 64 y.o. year old male who is a primary care patient of Stacks, Cletus Gash, MD. The CCM team was consulted for assistance with chronic disease management and care coordination needs.    Review of patient status, including review of consultants reports, relevant laboratory and other test results, and collaboration with appropriate care team members and the patient's provider was performed as part of comprehensive patient evaluation and provision of chronic care management services.    I spoke with Mr Vaquera by telephone today regarding his diabetes and hypertension management as well as his recent Covid infection and current sinusitis.   Outpatient Encounter Medications as of 04/16/2019  Medication Sig  . aspirin EC 81 MG tablet Take 81 mg by mouth daily.  . blood glucose meter kit and supplies KIT Dispense based on patient and insurance preference. Test four times daily as directed. (FOR ICD-10 : E11.9  . Blood Glucose Monitoring Suppl (ONETOUCH VERIO) w/Device KIT 1 Device by Does not apply route 2 (two) times daily.  . Capsaicin (ICY HOT ARTHRITIS THERAPY EX) Apply 1 application topically 2 (two) times daily as needed (back pain).   . carvedilol (COREG) 6.25 MG tablet Take 1 tablet (6.25 mg total) by mouth 2 (two) times daily with a meal.  . cetirizine (ZYRTEC) 10 MG tablet Take 1 tablet (10 mg total) by mouth daily. For itching  . Cholecalciferol (VITAMIN D3) 25 MCG (1000 UT) CAPS Take 2,000 Units by mouth daily.  . fluticasone (FLONASE) 50 MCG/ACT nasal spray USE 2 SPRAYS IN EACH NOSTRIL DAILY  . glucose blood (ONETOUCH VERIO) test strip USE FOUR TIMES DAILY AS NEEDED Dx E11.9  . hydrALAZINE (APRESOLINE) 100 MG tablet TAKE (1) TABLET BY MOUTH (3) TIMES DAILY.  [name]  icosapent Ethyl (VASCEPA) 1 g capsule Take 2 capsules (2 g total) by mouth 2 (two) times daily.  . Insulin Glargine, 2 Unit Dial, (TOUJEO MAX SOLOSTAR) 300 UNIT/ML SOPN Inject 55 Units into the skin 2 (two) times daily.  . isosorbide mononitrate (IMDUR) 120 MG 24 hr tablet TAKE (1) TABLET BY MOUTH ONCE DAILY.  [name] loratadine (CLARITIN) 10 MG tablet Take 1 tablet (10 mg total) by mouth daily.  . Multiple Vitamins-Minerals (CENTRUM SILVER ADULT 50+ PO) Take 1 tablet by mouth daily.  . nabumetone (RELAFEN) 500 MG tablet TAKE TWO TABLETS BY MOUTH TWICE DAILY  . nitroGLYCERIN (NITROSTAT) 0.4 MG SL tablet DISSOLVE 1 TAB UNDER TOUNGE FOR CHEST PAIN. MAY REPEAT EVERY 5 MINUTES FOR 3 DOSES. IF NO RELIEF CALL 911 OR GO TO ER  . Omega-3 Fatty Acids (FISH OIL) 1000 MG CPDR Take 1,000 mg by mouth daily.   . pantoprazole (PROTONIX) 40 MG tablet TAKE ONE TABLET BY MOUTH TWICE DAILY.  [name] pregabalin (LYRICA) 200 MG capsule Take 2 capsules (400 mg total) by mouth at bedtime.  [name] PROAIR HFA 108 (90 Base) MCG/ACT inhaler USE 2 PUFFS EVERY 6 HOURS AS NEEDED  . ranolazine (RANEXA) 500 MG 12 hr tablet TAKE 1 TABLET BY MOUTH TWICE DAILY.  . rivaroxaban (XARELTO) 2.5 MG TABS tablet Take 1 tablet (2.5 mg total) by mouth 2 (two) times daily.  . rosuvastatin (CRESTOR) 20 MG tablet Take 1 tablet (20 mg total) by mouth daily.  [name] terbinafine (LAMISIL) 1 % cream Apply  1 application topically 2 (two) times daily. Apply to both feet and between toes  . torsemide (DEMADEX) 20 MG tablet TAKE 3 TABLETS BY MOUTH DAILY.   Facility-Administered Encounter Medications as of 04/16/2019  Medication  . aflibercept (EYLEA) SOLN 2 mg     RN Care Plan           This Visit's Progress   . "I would like to get a cane" (pt-stated)       Current Barriers:  [name] Knowledge Deficits related to how to obtain a cane . Film/video editor.   Nurse Case Manager Clinical Goal(s):  [name] Over the next 30 days, patient will work with PCP office to obtain  order for single point cane  Interventions:  . Previously discussed patients desire/need for a cane. He would like a single point cane from [place] . Chart reviewed, including recent office notes o Order for cane sent to [employer] . Talked with patient by telephone. States that he will pick it up at the first of the month.   Patient Self Care Activities:  . Performs ADL's independently . Performs IADL's independently  Please see past updates related to this goal by clicking on the "Past Updates" button in the selected goal      . Blood Pressure Management       CARE PLAN ENTRY (see longtitudinal plan of care for additional care plan information)  Current Barriers:  . Chronic Disease Management support and education needs related to hypertension  Nurse Case Manager Clinical Goal(s):  [name] Over the next 30 days, patient will work with Consulting civil engineer to address needs related to hypertension . Over the next 90 days, the patient will demonstrate ongoing self health care management ability as evidenced by checking and recording blood pressure daily*  Interventions:  . Evaluation of current treatment plan related to hypertension and patient's adherence to plan as established by provider. . Reviewed medications with patient and discussed hydralazine and coreg . Discussed plans with patient for ongoing care management follow up and provided patient with direct contact information for care management team . Advised patient, providing education and rationale, to monitor blood pressure daily and record, calling [phone] for findings outside established parameters.   Patient Self Care Activities:  . Performs ADL's independently . Performs IADL's independently   Initial goal documentation     . Blood Sugar Management       Current Barriers:  . Film/video editor.  . Chronic Disease Management support and education needs related to diabetes  Nurse Case Manager  Clinical Goal(s):  [name] Over the next 30 days, patient will work with Consulting civil engineer to address needs related to diabetes management  Interventions:  . Evaluation of current treatment plan related to diabetes management and patient's adherence to plan as established by provider. . Advised patient to check and record blood sugar daily as directed . Reviewed medications with patient  . Discussed plans with patient for ongoing care management follow up and provided patient with direct contact information for care management team . Previously discussed need for new blood sugar meter. Order was sent in to [facility] and patient was able to pick that up. He has been using it regularly without any problems.  . Discussed recent blood sugar readings. Patient reports reading of 147 this morning fasting. No hypoglycemic events.  . Encouraged patient to reach out to PCP at [phone] with any readings outside of recommended range  Patient Self Care Activities:  .  Performs ADL's independently . Performs IADL's independently   Please see past updates related to this goal by clicking on the "Past Updates" button in the selected goal      . Sinusitis       CARE PLAN ENTRY (see longtitudinal plan of care for additional care plan information)  Current Barriers:  [name] Knowledge Deficits related to treatment of sinusitis  Nurse Case Manager Clinical Goal(s):  [name] Over the next 10 days, patient will have resolution of sinusitis symptoms  Interventions:  . Chart reviewed including recent hospital ED notes and office notes . Medications reviewed and discussed with patient . Discussed HPI o Nasal congestion and feeling of fullness maxillary sinuses Left side. Some post nasal drainage and burning sensation in sinuses at night when lying down. He cannot blow anything out of his nose.  . Reviewed recent treatment recommendations with patient: coricidin, nasal saline rinses with distilled water, Flonase  nasal pray, and Claritin . Discussed heat source in home. Using electric heaters now. Has d/c use of kerosine heaters. Discussed that air is likely dry and that saline nasal rinses and/or sprays and a humidifier will help moisturize nasal and sinus passages . Advised patient to reach out to PCP if symptoms are not improving over the next few days or if at any point his symptoms worsen  Patient Self Care Activities:  . Patient verbalizes understanding of plan to sinusitis . Performs ADL's independently . Performs IADL's independently  Initial goal documentation          Plan:   The care management team will reach out to the patient again over the next 14 days.    Chong Sicilian, BSN, RN-BC Embedded Chronic Care Manager [facility] [place] [facility] Direct Dial: [phone]

## 2019-04-22 ENCOUNTER — Telehealth: Payer: Self-pay | Admitting: Family Medicine

## 2019-04-22 NOTE — Telephone Encounter (Signed)
Pt scheduled 04/25/19 per Dr. Livia Snellen.

## 2019-04-22 NOTE — Telephone Encounter (Signed)
Pt. Needs to be seen for this. Thanks, WS 

## 2019-04-23 ENCOUNTER — Encounter (HOSPITAL_COMMUNITY): Payer: Medicare Other

## 2019-04-23 ENCOUNTER — Other Ambulatory Visit (HOSPITAL_COMMUNITY): Payer: Medicare Other

## 2019-04-23 ENCOUNTER — Encounter: Payer: Medicare Other | Admitting: Vascular Surgery

## 2019-04-24 ENCOUNTER — Other Ambulatory Visit: Payer: Self-pay

## 2019-04-25 ENCOUNTER — Ambulatory Visit (INDEPENDENT_AMBULATORY_CARE_PROVIDER_SITE_OTHER): Payer: Medicare Other | Admitting: Family Medicine

## 2019-04-25 ENCOUNTER — Encounter: Payer: Self-pay | Admitting: Family Medicine

## 2019-04-25 ENCOUNTER — Other Ambulatory Visit: Payer: Self-pay

## 2019-04-25 VITALS — BP 134/76 | HR 96 | Temp 99.8°F | Ht 69.0 in | Wt 211.8 lb

## 2019-04-25 DIAGNOSIS — E1142 Type 2 diabetes mellitus with diabetic polyneuropathy: Secondary | ICD-10-CM

## 2019-04-25 DIAGNOSIS — I10 Essential (primary) hypertension: Secondary | ICD-10-CM

## 2019-04-25 DIAGNOSIS — Z794 Long term (current) use of insulin: Secondary | ICD-10-CM

## 2019-04-25 DIAGNOSIS — E1100 Type 2 diabetes mellitus with hyperosmolarity without nonketotic hyperglycemic-hyperosmolar coma (NKHHC): Secondary | ICD-10-CM

## 2019-04-25 DIAGNOSIS — R6889 Other general symptoms and signs: Secondary | ICD-10-CM | POA: Diagnosis not present

## 2019-04-25 DIAGNOSIS — J0100 Acute maxillary sinusitis, unspecified: Secondary | ICD-10-CM

## 2019-04-25 LAB — BAYER DCA HB A1C WAIVED: HB A1C (BAYER DCA - WAIVED): 12 % — ABNORMAL HIGH (ref <7.0)

## 2019-04-25 MED ORDER — OZEMPIC (0.25 OR 0.5 MG/DOSE) 2 MG/1.5ML ~~LOC~~ SOPN: 0.5000 mg | PEN_INJECTOR | SUBCUTANEOUS | 2 refills | Status: DC

## 2019-04-25 MED ORDER — TOUJEO MAX SOLOSTAR 300 UNIT/ML ~~LOC~~ SOPN: PEN_INJECTOR | SUBCUTANEOUS | 2 refills | Status: DC

## 2019-04-25 MED ORDER — BETAMETHASONE SOD PHOS & ACET 6 (3-3) MG/ML IJ SUSP
6.0000 mg | Freq: Once | INTRAMUSCULAR | Status: AC
  Administered 2019-04-25: 6 mg via INTRAMUSCULAR

## 2019-04-25 MED ORDER — CEFUROXIME AXETIL 250 MG PO TABS: 250.0000 mg | ORAL_TABLET | Freq: Two times a day (BID) | ORAL | 0 refills | Status: AC

## 2019-04-25 NOTE — Progress Notes (Signed)
Subjective:  Patient ID: Brad Mendoza, male    DOB: 24-Dec-1955  Age: 64 y.o. MRN: [id number]  CC: Follow-up (Diabetes)   HPI Brad Mendoza presents for recheck of diabetes.  He was noted to have an elevated A1c this was repeated today showing extreme elevation.  However the patient states he has been having lows as well.  Specifically those tend to occur during the nighttime.  Currently taking Toujeo twice daily.  His regimen does not include a medication for postprandial peaks.    Symptoms include congestion, facial pain, nasal congestion, non productive cough, post nasal drip and sinus pressure. There is no fever, chills, or sweats. Onset of symptoms was a few days ago, gradually worsening since that time.    Depression screen [facility] 2/9 09/19/2018 07/27/2018 04/10/2018  Decreased Interest 0 0 0  Down, Depressed, Hopeless 0 0 0  PHQ - 2 Score 0 0 0  Some recent data might be hidden    History Brad Mendoza has a past medical history of Acid reflux, Back pain, CAD (coronary artery disease), CHF (congestive heart failure) ([place]), Diabetes mellitus (2005), Diverticulosis, H/O chest pain (2005), HTN (hypertension), Hyperlipidemia, Nonischemic cardiomyopathy ([place]), Onychomycosis, and Polysubstance abuse ([place]).   He has a past surgical history that includes Appendectomy; Colonoscopy (N/A, 08/01/2013); Cardiac catheterization (2005); Esophagogastroduodenoscopy (N/A, 04/23/2015); RIGHT/LEFT HEART CATH AND CORONARY ANGIOGRAPHY (N/A, 06/03/2016); RIGHT/LEFT HEART CATH AND CORONARY ANGIOGRAPHY (N/A, 03/19/2018); and CORONARY STENT INTERVENTION (N/A, 03/19/2018).   His family history includes Alcohol abuse in his brother; Cancer in his mother; Diabetes in his father; Hypertension in his father.He reports that he has never smoked. His smokeless tobacco use includes snuff. He reports previous alcohol use of about 1.0 standard drinks of alcohol per week. He reports current drug use. Drug: Marijuana.    ROS Review  of Systems  Constitutional: Negative.   HENT: Negative.   Eyes: Negative for visual disturbance.  Respiratory: Negative for cough and shortness of breath.   Cardiovascular: Negative for chest pain and leg swelling.  Gastrointestinal: Negative for abdominal pain, diarrhea, nausea and vomiting.  Genitourinary: Negative for difficulty urinating.  Musculoskeletal: Negative for arthralgias and myalgias.  Skin: Negative for rash.  Neurological: Negative for headaches.  Psychiatric/Behavioral: Negative for sleep disturbance.    Objective:  BP 134/76   Pulse 96   Temp 99.8 F (37.7 C) (Temporal)   Ht 5' 9"  (1.753 m)   Wt 211 lb 12.8 oz (96.1 kg)   BMI 31.28 kg/m   BP Readings from Last 3 Encounters:  04/26/19 136/72  04/25/19 134/76  04/02/19 (!) 148/80    Wt Readings from Last 3 Encounters:  04/26/19 213 lb 12.8 oz (97 kg)  04/25/19 211 lb 12.8 oz (96.1 kg)  04/02/19 202 lb (91.6 kg)     Physical Exam Vitals reviewed.  Constitutional:      Appearance: He is well-developed.  HENT:     Head: Normocephalic and atraumatic.     Right Ear: Tympanic membrane and external ear normal. No decreased hearing noted.     Left Ear: Tympanic membrane and external ear normal. No decreased hearing noted.     Mouth/Throat:     Pharynx: No oropharyngeal exudate or posterior oropharyngeal erythema.  Eyes:     Pupils: Pupils are equal, round, and reactive to light.  Cardiovascular:     Rate and Rhythm: Normal rate and regular rhythm.     Heart sounds: No murmur.  Pulmonary:     Effort: No respiratory  distress.     Breath sounds: Normal breath sounds.  Abdominal:     Palpations: Abdomen is soft.     Tenderness: There is no abdominal tenderness.  Musculoskeletal:     Cervical back: Normal range of motion and neck supple.  Skin:    General: Skin is warm and dry.       Assessment & Plan:   Brad Mendoza was seen today for follow-up.  Diagnoses and all orders for this visit:  Type 2  diabetes mellitus with diabetic polyneuropathy, with long-term current use of insulin (HCC) -     CBC with Differential/Platelet -     CMP14+EGFR -     Bayer DCA Hb A1c Waived -     Semaglutide,0.25 or 0.5MG/DOS, (OZEMPIC, 0.25 OR 0.5 MG/DOSE,) 2 MG/1.5ML SOPN; Inject 0.5 mg into the skin once a week. -     insulin glargine, 2 Unit Dial, (TOUJEO MAX SOLOSTAR) 300 UNIT/ML Solostar Pen; Inject 50 units each morning and 40 units each evening  Essential hypertension -     CBC with Differential/Platelet -     CMP14+EGFR -     Bayer DCA Hb A1c Waived  Type 2 diabetes mellitus with hyperosmolar nonketotic hyperglycemia (HCC) -     CBC with Differential/Platelet -     CMP14+EGFR -     Bayer DCA Hb A1c Waived  Acute maxillary sinusitis, recurrence not specified -     betamethasone acetate-betamethasone sodium phosphate (CELESTONE) injection 6 mg -     cefUROXime (CEFTIN) 250 MG tablet; Take 1 tablet (250 mg total) by mouth 2 (two) times daily with a meal for 10 days. For sinuses       I have changed Brad Mendoza's Toujeo Max [employer]. I am also having him start on Ozempic (0.25 or 0.5 MG/DOSE) and cefUROXime. Additionally, I am having him maintain his aspirin EC, Multiple Vitamins-Minerals (CENTRUM SILVER ADULT 50+ PO), Capsaicin (ICY HOT ARTHRITIS THERAPY EX), Fish Oil, Vitamin D3, terbinafine, cetirizine, nabumetone, fluticasone, ProAir HFA, nitroGLYCERIN, OneTouch Verio, rosuvastatin, icosapent Ethyl, carvedilol, Xarelto, pregabalin, blood glucose meter kit and supplies, pantoprazole, isosorbide mononitrate, hydrALAZINE, torsemide, OneTouch Verio, ranolazine, and loratadine. We administered betamethasone acetate-betamethasone sodium phosphate. We will continue to administer aflibercept.  Allergies as of 04/25/2019      Reactions   Sulfa Antibiotics Shortness Of Breath   Penicillins Rash, Other (See Comments)   Has patient had a PCN reaction causing immediate rash, facial/tongue/throat  swelling, SOB or lightheadedness with hypotension: No Has patient had a PCN reaction causing severe rash involving mucus membranes or skin necrosis: No Has patient had a PCN reaction that required hospitalization No Has patient had a PCN reaction occurring within the last 10 years: No If all of the above answers are "NO", then may proceed with Cephalosporin use.      Medication List       Accurate as of April 25, 2019 11:59 PM. If you have any questions, ask your nurse or doctor.        aspirin EC 81 MG tablet Take 81 mg by mouth daily.   blood glucose meter kit and supplies Kit Dispense based on patient and insurance preference. Test four times daily as directed. (FOR ICD-10 : E11.9   carvedilol 6.25 MG tablet Commonly known as: COREG Take 1 tablet (6.25 mg total) by mouth 2 (two) times daily with a meal.   cefUROXime 250 MG tablet Commonly known as: CEFTIN Take 1 tablet (250 mg total) by mouth 2 (two)  times daily with a meal for 10 days. For sinuses Started by: [name], MD   CENTRUM SILVER ADULT 50+ PO Take 1 tablet by mouth daily.   cetirizine 10 MG tablet Commonly known as: ZYRTEC Take 1 tablet (10 mg total) by mouth daily. For itching   Fish Oil 1000 MG Cpdr Take 1,000 mg by mouth daily.   fluticasone 50 MCG/ACT nasal spray Commonly known as: FLONASE USE 2 SPRAYS IN EACH NOSTRIL DAILY   hydrALAZINE 100 MG tablet Commonly known as: APRESOLINE TAKE (1) TABLET BY MOUTH (3) TIMES DAILY.   icosapent Ethyl 1 g capsule Commonly known as: Vascepa Take 2 capsules (2 g total) by mouth 2 (two) times daily.   ICY HOT ARTHRITIS THERAPY EX Apply 1 application topically 2 (two) times daily as needed (back pain).   isosorbide mononitrate 120 MG 24 hr tablet Commonly known as: IMDUR TAKE (1) TABLET BY MOUTH ONCE DAILY.   loratadine 10 MG tablet Commonly known as: CLARITIN Take 1 tablet (10 mg total) by mouth daily.   nabumetone 500 MG tablet Commonly known as:  RELAFEN TAKE TWO TABLETS BY MOUTH TWICE DAILY   nitroGLYCERIN 0.4 MG SL tablet Commonly known as: NITROSTAT DISSOLVE 1 TAB UNDER TOUNGE FOR CHEST PAIN. MAY REPEAT EVERY 5 MINUTES FOR 3 DOSES. IF NO RELIEF CALL 911 OR GO TO ER   OneTouch Verio test strip Generic drug: glucose blood USE FOUR TIMES DAILY AS NEEDED Dx E11.9   OneTouch Verio w/Device Kit 1 Device by Does not apply route 2 (two) times daily.   Ozempic (0.25 or 0.5 MG/DOSE) 2 MG/1.5ML Sopn Generic drug: Semaglutide(0.25 or 0.5MG/DOS) Inject 0.5 mg into the skin once a week. Started by: [name], MD   pantoprazole 40 MG tablet Commonly known as: PROTONIX TAKE ONE TABLET BY MOUTH TWICE DAILY.   pregabalin 200 MG capsule Commonly known as: LYRICA Take 2 capsules (400 mg total) by mouth at bedtime.   ProAir HFA 108 (90 Base) MCG/ACT inhaler Generic drug: albuterol USE 2 PUFFS EVERY 6 HOURS AS NEEDED   ranolazine 500 MG 12 hr tablet Commonly known as: RANEXA TAKE 1 TABLET BY MOUTH TWICE DAILY.   rosuvastatin 20 MG tablet Commonly known as: CRESTOR Take 1 tablet (20 mg total) by mouth daily.   terbinafine 1 % cream Commonly known as: LAMISIL Apply 1 application topically 2 (two) times daily. Apply to both feet and between toes   torsemide 20 MG tablet Commonly known as: DEMADEX TAKE 3 TABLETS BY MOUTH DAILY.   Toujeo Max SoloStar 300 UNIT/ML Solostar Pen Generic drug: insulin glargine (2 Unit Dial) Inject 50 units each morning and 40 units each evening What changed:   how much to take  how to take this  when to take this  additional instructions Changed by: [name], MD   Vitamin D3 25 MCG (1000 UT) Caps Take 2,000 Units by mouth daily.   Xarelto 2.5 MG Tabs tablet Generic drug: rivaroxaban Take 1 tablet (2.5 mg total) by mouth 2 (two) times daily.      Time spent with patient 30 minutes.  More than half was spent in consultation regarding the lack of control of his diabetes.  We  also adjusted his dose of Toujeo and taught him how to use his Ozempic.  I believe that adjusting the Toujeo downward and adding the Ozempic will help with both the highs and lows by adding a mealtime agent to soften the peaks and at the same time  decreasing the night time lows by decreasing the 24-hour baseline coverage.  We will have him stay on this for the next month and follow-up at the end of that time.  Again he was trying to use the Ozempic samples 0.5 daily  Follow-up: Return in about 1 month (around 05/26/2019) for diabetes.  [name], M.D.

## 2019-04-26 ENCOUNTER — Encounter: Payer: Self-pay | Admitting: Internal Medicine

## 2019-04-26 ENCOUNTER — Ambulatory Visit: Payer: Medicare Other | Admitting: *Deleted

## 2019-04-26 ENCOUNTER — Other Ambulatory Visit: Payer: Self-pay | Admitting: *Deleted

## 2019-04-26 ENCOUNTER — Ambulatory Visit: Payer: Medicare Other | Admitting: Internal Medicine

## 2019-04-26 VITALS — BP 136/72 | HR 69 | Ht 69.0 in | Wt 213.8 lb

## 2019-04-26 DIAGNOSIS — I5042 Chronic combined systolic (congestive) and diastolic (congestive) heart failure: Secondary | ICD-10-CM | POA: Diagnosis not present

## 2019-04-26 DIAGNOSIS — R6889 Other general symptoms and signs: Secondary | ICD-10-CM | POA: Diagnosis not present

## 2019-04-26 DIAGNOSIS — D649 Anemia, unspecified: Secondary | ICD-10-CM

## 2019-04-26 DIAGNOSIS — J0100 Acute maxillary sinusitis, unspecified: Secondary | ICD-10-CM

## 2019-04-26 DIAGNOSIS — N184 Chronic kidney disease, stage 4 (severe): Secondary | ICD-10-CM | POA: Diagnosis not present

## 2019-04-26 DIAGNOSIS — E1142 Type 2 diabetes mellitus with diabetic polyneuropathy: Secondary | ICD-10-CM

## 2019-04-26 LAB — CBC WITH DIFFERENTIAL/PLATELET
Basophils Absolute: 0.1 10*3/uL (ref 0.0–0.2)
Basos: 1 %
EOS (ABSOLUTE): 0.4 10*3/uL (ref 0.0–0.4)
Eos: 5 %
Hematocrit: 28.8 % — ABNORMAL LOW (ref 37.5–51.0)
Hemoglobin: 9.6 g/dL — ABNORMAL LOW (ref 13.0–17.7)
Immature Grans (Abs): 0 10*3/uL (ref 0.0–0.1)
Immature Granulocytes: 0 %
Lymphocytes Absolute: 1.6 10*3/uL (ref 0.7–3.1)
Lymphs: 20 %
MCH: 27.8 pg (ref 26.6–33.0)
MCHC: 33.3 g/dL (ref 31.5–35.7)
MCV: 84 fL (ref 79–97)
Monocytes Absolute: 0.7 10*3/uL (ref 0.1–0.9)
Monocytes: 9 %
Neutrophils Absolute: 5 10*3/uL (ref 1.4–7.0)
Neutrophils: 65 %
Platelets: 265 10*3/uL (ref 150–450)
RBC: 3.45 x10E6/uL — ABNORMAL LOW (ref 4.14–5.80)
RDW: 14.5 % (ref 11.6–15.4)
WBC: 7.7 10*3/uL (ref 3.4–10.8)

## 2019-04-26 LAB — CMP14+EGFR
ALT: 26 IU/L (ref 0–44)
AST: 23 IU/L (ref 0–40)
Albumin/Globulin Ratio: 1.4 (ref 1.2–2.2)
Albumin: 3.2 g/dL — ABNORMAL LOW (ref 3.8–4.8)
Alkaline Phosphatase: 131 IU/L — ABNORMAL HIGH (ref 39–117)
BUN/Creatinine Ratio: 27 — ABNORMAL HIGH (ref 10–24)
BUN: 57 mg/dL — ABNORMAL HIGH (ref 8–27)
Bilirubin Total: <0.2 mg/dL (ref 0.0–1.2)
CO2: 22 mmol/L (ref 20–29)
Calcium: 8.4 mg/dL — ABNORMAL LOW (ref 8.6–10.2)
Chloride: 109 mmol/L — ABNORMAL HIGH (ref 96–106)
Creatinine, Ser: 2.14 mg/dL — ABNORMAL HIGH (ref 0.76–1.27)
GFR calc Af Amer: 37 mL/min/{1.73_m2} — ABNORMAL LOW (ref >59)
GFR calc non Af Amer: 32 mL/min/{1.73_m2} — ABNORMAL LOW (ref >59)
Globulin, Total: 2.3 g/dL (ref 1.5–4.5)
Glucose: 121 mg/dL — ABNORMAL HIGH (ref 65–99)
Potassium: 4.5 mmol/L (ref 3.5–5.2)
Sodium: 144 mmol/L (ref 134–144)
Total Protein: 5.5 g/dL — ABNORMAL LOW (ref 6.0–8.5)

## 2019-04-26 NOTE — Chronic Care Management (AMB) (Signed)
Chronic Care Management   Follow Up Note   04/26/2019 Name: Brad Mendoza MRN: [id number] DOB: November 14, 1955  Referred by: [name], MD Reason for referral : Chronic Care Management (RN follow up)   Brad Mendoza is a 64 y.o. year old male who is a primary care patient of Stacks, Cletus Gash, MD. The CCM team was consulted for assistance with chronic disease management and care coordination needs.    Review of patient status, including review of consultants reports, relevant laboratory and other test results, and collaboration with appropriate care team members and the patient's provider was performed as part of comprehensive patient evaluation and provision of chronic care management services.    SDOH (Social Determinants of Health) assessments performed: No See Care Plan activities for detailed interventions related to [facility])    I spoke with Brad Mendoza by telephone regarding sinusitis.   Outpatient Encounter Medications as of 04/26/2019  Medication Sig  . aspirin EC 81 MG tablet Take 81 mg by mouth daily.  . blood glucose meter kit and supplies KIT Dispense based on patient and insurance preference. Test four times daily as directed. (FOR ICD-10 : E11.9  . Blood Glucose Monitoring Suppl (ONETOUCH VERIO) w/Device KIT 1 Device by Does not apply route 2 (two) times daily.  . Capsaicin (ICY HOT ARTHRITIS THERAPY EX) Apply 1 application topically 2 (two) times daily as needed (back pain).   . carvedilol (COREG) 6.25 MG tablet Take 1 tablet (6.25 mg total) by mouth 2 (two) times daily with a meal.  . cefUROXime (CEFTIN) 250 MG tablet Take 1 tablet (250 mg total) by mouth 2 (two) times daily with a meal for 10 days. For sinuses  . cetirizine (ZYRTEC) 10 MG tablet Take 1 tablet (10 mg total) by mouth daily. For itching  . Cholecalciferol (VITAMIN D3) 25 MCG (1000 UT) CAPS Take 2,000 Units by mouth daily.  . fluticasone (FLONASE) 50 MCG/ACT nasal spray USE 2 SPRAYS IN EACH NOSTRIL DAILY  .  glucose blood (ONETOUCH VERIO) test strip USE FOUR TIMES DAILY AS NEEDED Dx E11.9  . hydrALAZINE (APRESOLINE) 100 MG tablet TAKE (1) TABLET BY MOUTH (3) TIMES DAILY.  [name] icosapent Ethyl (VASCEPA) 1 g capsule Take 2 capsules (2 g total) by mouth 2 (two) times daily.  . insulin glargine, 2 Unit Dial, (TOUJEO MAX SOLOSTAR) 300 UNIT/ML Solostar Pen Inject 50 units each morning and 40 units each evening  . isosorbide mononitrate (IMDUR) 120 MG 24 hr tablet TAKE (1) TABLET BY MOUTH ONCE DAILY.  [name] loratadine (CLARITIN) 10 MG tablet Take 1 tablet (10 mg total) by mouth daily.  . Multiple Vitamins-Minerals (CENTRUM SILVER ADULT 50+ PO) Take 1 tablet by mouth daily.  . nabumetone (RELAFEN) 500 MG tablet TAKE TWO TABLETS BY MOUTH TWICE DAILY  . nitroGLYCERIN (NITROSTAT) 0.4 MG SL tablet DISSOLVE 1 TAB UNDER TOUNGE FOR CHEST PAIN. MAY REPEAT EVERY 5 MINUTES FOR 3 DOSES. IF NO RELIEF CALL 911 OR GO TO ER  . Omega-3 Fatty Acids (FISH OIL) 1000 MG CPDR Take 1,000 mg by mouth daily.   . pantoprazole (PROTONIX) 40 MG tablet TAKE ONE TABLET BY MOUTH TWICE DAILY.  [name] pregabalin (LYRICA) 200 MG capsule Take 2 capsules (400 mg total) by mouth at bedtime.  [name] PROAIR HFA 108 (90 Base) MCG/ACT inhaler USE 2 PUFFS EVERY 6 HOURS AS NEEDED  . ranolazine (RANEXA) 500 MG 12 hr tablet TAKE 1 TABLET BY MOUTH TWICE DAILY.  . rivaroxaban (XARELTO) 2.5 MG TABS tablet Take  1 tablet (2.5 mg total) by mouth 2 (two) times daily.  . rosuvastatin (CRESTOR) 20 MG tablet Take 1 tablet (20 mg total) by mouth daily.  . Semaglutide,0.25 or 0.5MG/DOS, (OZEMPIC, 0.25 OR 0.5 MG/DOSE,) 2 MG/1.5ML SOPN Inject 0.5 mg into the skin once a week.  . terbinafine (LAMISIL) 1 % cream Apply 1 application topically 2 (two) times daily. Apply to both feet and between toes  . torsemide (DEMADEX) 20 MG tablet TAKE 3 TABLETS BY MOUTH DAILY.   Facility-Administered Encounter Medications as of 04/26/2019  Medication  . aflibercept (EYLEA) SOLN 2 mg      RN  Care Plan           This Visit's Progress   . Sinusitis       CARE PLAN ENTRY (see longtitudinal plan of care for additional care plan information)  Sinusitis in patient with diabetes  Current Barriers:  [name] Knowledge Deficits related to treatment of sinusitis  Nurse Case Manager Clinical Goal(s):  [name] Over the next 10 days, patient will have resolution of sinusitis symptoms  Interventions:  . Chart reviewed including recent hospital ED notes and office notes o Patient had office visit with Dr Livia Snellen yesterday o Prescribed ceftin and given betamethasone steroid injection . Medications reviewed and discussed with patient o He has not started Ceftin yet but it will be delivered today o Advised that Steroid injection may cause an increase in blood sugar for a few days . Previously discussed using saline nasal rinses, flonase, and Coricidin . Advised patient to reach out to PCP if symptoms have not resolved over the next week or if he develops new symptoms or if current symptoms worsen  Patient Self Care Activities:  . Patient verbalizes understanding of plan to sinusitis . Performs ADL's independently . Performs IADL's independently  Please see past updates related to this goal by clicking on the "Past Updates" button in the selected goal          Plan:   The care management team will reach out to the patient again over the next 45 days.    Chong Sicilian, BSN, RN-BC Embedded Chronic Care Manager [facility] Direct Dial: [phone]

## 2019-04-26 NOTE — Progress Notes (Signed)
HPI Brad Mendoza is referred today by Dr. Greggory Brandy to consider S-ICD insertion. The patient is a pleasant 64 yo man with a non-ischemic CM, chronic systolic heart failure dating back over 6 years. He has been on guideline directed medical therapy. He has not had syncope. He has had progressive kidney dysfunction. He has CAD and prior stent to the RCA but his CAD is not thought to be the cause of his CM. His creatinine was in the 3's but yesterday was 2.1.  Allergies  Allergen Reactions  . Sulfa Antibiotics Shortness Of Breath  . Penicillins Rash and Other (See Comments)    Has patient had a PCN reaction causing immediate rash, facial/tongue/throat swelling, SOB or lightheadedness with hypotension: No Has patient had a PCN reaction causing severe rash involving mucus membranes or skin necrosis: No Has patient had a PCN reaction that required hospitalization No Has patient had a PCN reaction occurring within the last 10 years: No If all of the above answers are "NO", then may proceed with Cephalosporin use.      Current Outpatient Medications  Medication Sig Dispense Refill  . aspirin EC 81 MG tablet Take 81 mg by mouth daily.    . blood glucose meter kit and supplies KIT Dispense based on patient and insurance preference. Test four times daily as directed. (FOR ICD-10 : E11.9 1 each 0  . Blood Glucose Monitoring Suppl (ONETOUCH VERIO) w/Device KIT 1 Device by Does not apply route 2 (two) times daily. 1 kit 0  . Capsaicin (ICY HOT ARTHRITIS THERAPY EX) Apply 1 application topically 2 (two) times daily as needed (back pain).     . carvedilol (COREG) 6.25 MG tablet Take 1 tablet (6.25 mg total) by mouth 2 (two) times daily with a meal. 180 tablet 1  . cefUROXime (CEFTIN) 250 MG tablet Take 1 tablet (250 mg total) by mouth 2 (two) times daily with a meal for 10 days. For sinuses 20 tablet 0  . cetirizine (ZYRTEC) 10 MG tablet Take 1 tablet (10 mg total) by mouth daily. For itching 90 tablet 3  .  Cholecalciferol (VITAMIN D3) 25 MCG (1000 UT) CAPS Take 2,000 Units by mouth daily.    . fluticasone (FLONASE) 50 MCG/ACT nasal spray USE 2 SPRAYS IN EACH NOSTRIL DAILY 48 g 1  . glucose blood (ONETOUCH VERIO) test strip USE FOUR TIMES DAILY AS NEEDED Dx E11.9 100 each 11  . hydrALAZINE (APRESOLINE) 100 MG tablet TAKE (1) TABLET BY MOUTH (3) TIMES DAILY. 90 tablet 0  . icosapent Ethyl (VASCEPA) 1 g capsule Take 2 capsules (2 g total) by mouth 2 (two) times daily. 360 capsule 1  . insulin glargine, 2 Unit Dial, (TOUJEO MAX SOLOSTAR) 300 UNIT/ML Solostar Pen Inject 50 units each morning and 40 units each evening 33 mL 2  . isosorbide mononitrate (IMDUR) 120 MG 24 hr tablet TAKE (1) TABLET BY MOUTH ONCE DAILY. 30 tablet 0  . loratadine (CLARITIN) 10 MG tablet Take 1 tablet (10 mg total) by mouth daily. 30 tablet 11  . Multiple Vitamins-Minerals (CENTRUM SILVER ADULT 50+ PO) Take 1 tablet by mouth daily.    . nabumetone (RELAFEN) 500 MG tablet TAKE TWO TABLETS BY MOUTH TWICE DAILY 360 tablet 1  . nitroGLYCERIN (NITROSTAT) 0.4 MG SL tablet DISSOLVE 1 TAB UNDER TOUNGE FOR CHEST PAIN. MAY REPEAT EVERY 5 MINUTES FOR 3 DOSES. IF NO RELIEF CALL 911 OR GO TO ER 25 tablet 2  . Omega-3 Fatty Acids (  FISH OIL) 1000 MG CPDR Take 1,000 mg by mouth daily.     . pantoprazole (PROTONIX) 40 MG tablet TAKE ONE TABLET BY MOUTH TWICE DAILY. 60 tablet 0  . pregabalin (LYRICA) 200 MG capsule Take 2 capsules (400 mg total) by mouth at bedtime. 60 capsule 2  . PROAIR HFA 108 (90 Base) MCG/ACT inhaler USE 2 PUFFS EVERY 6 HOURS AS NEEDED 8.5 g 3  . ranolazine (RANEXA) 500 MG 12 hr tablet TAKE 1 TABLET BY MOUTH TWICE DAILY. 60 tablet 0  . rivaroxaban (XARELTO) 2.5 MG TABS tablet Take 1 tablet (2.5 mg total) by mouth 2 (two) times daily. 60 tablet 6  . rosuvastatin (CRESTOR) 20 MG tablet Take 1 tablet (20 mg total) by mouth daily. 90 tablet 1  . Semaglutide,0.25 or 0.5MG/DOS, (OZEMPIC, 0.25 OR 0.5 MG/DOSE,) 2 MG/1.5ML SOPN Inject  0.5 mg into the skin once a week. 1 pen 2  . terbinafine (LAMISIL) 1 % cream Apply 1 application topically 2 (two) times daily. Apply to both feet and between toes 30 g 1  . torsemide (DEMADEX) 20 MG tablet TAKE 3 TABLETS BY MOUTH DAILY. 90 tablet 0   Current Facility-Administered Medications  Medication Dose Route Frequency Provider Last Rate Last Admin  . aflibercept (EYLEA) SOLN 2 mg  2 mg Intravitreal  [name], MD   2 mg at 04/25/18 8003     Past Medical History:  Diagnosis Date  . Acid reflux   . Back pain    f4 and f5  . CAD (coronary artery disease)   . CHF (congestive heart failure) ([place])    last echo was in March 2016  . Diabetes mellitus 2005  . Diverticulosis   . H/O chest pain 2005   ECHO  . HTN (hypertension)   . Hyperlipidemia   . Nonischemic cardiomyopathy ([place])   . Onychomycosis   . Polysubstance abuse ([place])     ROS:   All systems reviewed and negative except as noted in the HPI.   Past Surgical History:  Procedure Laterality Date  . APPENDECTOMY    . CARDIAC CATHETERIZATION  2005   4 frech cath  . COLONOSCOPY N/A 08/01/2013   Procedure: COLONOSCOPY;  Surgeon: Rogene Houston, MD;  [facility];  Service: Endoscopy;  Laterality: N/A;  rescheduled to [place] notified pt  . CORONARY STENT INTERVENTION N/A 03/19/2018   Procedure: CORONARY STENT INTERVENTION;  Surgeon: Leonie Man, MD;  Location: [place] [facility];  Service: Cardiovascular;  Laterality: N/A;  . ESOPHAGOGASTRODUODENOSCOPY N/A 04/23/2015   Procedure: ESOPHAGOGASTRODUODENOSCOPY (EGD);  Surgeon: Rogene Houston, MD;  [facility];  Service: Endoscopy;  Laterality: N/A;  1:25  . RIGHT/LEFT HEART CATH AND CORONARY ANGIOGRAPHY N/A 06/03/2016   Procedure: Right/Left Heart Cath and Coronary Angiography;  Surgeon: Belva Crome, MD;  Location: [facility] [facility];  Service: Cardiovascular;  Laterality: N/A;  . RIGHT/LEFT HEART CATH AND CORONARY ANGIOGRAPHY N/A 03/19/2018    Procedure: RIGHT/LEFT HEART CATH AND CORONARY ANGIOGRAPHY;  Surgeon: Jolaine Artist, MD;  Location: [facility];  Service: Cardiovascular;  Laterality: N/A;     Family History  Problem Relation Age of Onset  . Hypertension Father   . Diabetes Father   . Cancer Mother   . Alcohol abuse Brother   . Colon cancer Neg Hx      Social History   Socioeconomic History  . Marital status: Legally Separated    Spouse name: Not on  file  . Number of children: 2  . Years of education: 4  . Highest education level: 4th grade  Occupational History  . Occupation: disabled  Tobacco Use  . Smoking status: Never Smoker  . Smokeless tobacco: Current User    Types: Snuff  . Tobacco comment: dips snuff about 6 cans/week for 3 years  Substance and Sexual Activity  . Alcohol use: Not Currently    Alcohol/week: 1.0 standard drinks    Types: 1 Cans of beer per week    Comment: occ - has cut down, now once every 2-3 months  . Drug use: Yes    Types: Marijuana    Comment: occasional use - smoking less  . Sexual activity: Yes    Birth control/protection: None  Other Topics Concern  . Not on file  Social History Narrative   Lives in [place] with spouse   Disabled.   Social Determinants of Health   Financial Resource Strain: Low Risk   . Difficulty of Paying Living Expenses: Not hard at all  Food Insecurity: No Food Insecurity  . Worried About Charity fundraiser in the Last Year: Never true  . Ran Out of Food in the Last Year: Never true  Transportation Needs: No Transportation Needs  . Lack of Transportation (Medical): No  . Lack of Transportation (Non-Medical): No  Physical Activity: Inactive  . Days of Exercise per Week: 0 days  . Minutes of Exercise per Session: 0 min  Stress: No Stress Concern Present  . Feeling of Stress : Not at all  Social Connections: Unknown  . Frequency of Communication with Friends and Family: More than three times a week  . Frequency of  Social Gatherings with Friends and Family: More than three times a week  . Attends Religious Services: 1 to 4 times per year  . Active Member of Clubs or Organizations: No  . Attends Archivist Meetings: Never  . Marital Status: Patient refused  Intimate Partner Violence: Not At Risk  . Fear of Current or Ex-Partner: No  . Emotionally Abused: No  . Physically Abused: No  . Sexually Abused: No     BP 136/72   Pulse 69   Ht 5' 9"  (1.753 m)   Wt 213 lb 12.8 oz (97 kg)   SpO2 (!) 88%   BMI 31.57 kg/m   Physical Exam:  Well appearing NAD HEENT: Unremarkable Neck:  No JVD, no thyromegally Lymphatics:  No adenopathy Back:  No CVA tenderness Lungs:  Clear with no wheezes HEART:  Regular rate rhythm, no murmurs, no rubs, no clicks Abd:  soft, positive bowel sounds, no organomegally, no rebound, no guarding Ext:  2 plus pulses, no edema, no cyanosis, no clubbing Skin:  No rashes no nodules Neuro:  CN II through XII intact, motor grossly intact  EKG - reviewed. NSR with a narrow QRS  Assess/Plan: 1. Non-ischemic CM - he has class 2 symptoms and an EF of 30%. I have discussed the indications/risks/benefits/goals/expectations of ICD insertion. He will call us if he wishes to proceed with a [employer]. 2. CAD - he denies anginal symptoms. He will continue his current meds. 3. HTN - his blood pressure is fairly well controlled. No change in his meds. 4. Stage 3 renal insufficiency -his creatinine has actually improved and he does not have any evidence of volume overload. We will follow.  Mikle Bosworth.D.

## 2019-04-26 NOTE — Patient Instructions (Signed)
Visit Information  Goals Addressed            This Visit's Progress   . Sinusitis       CARE PLAN ENTRY (see longtitudinal plan of care for additional care plan information)  Sinusitis in patient with diabetes  Current Barriers:  [name] Knowledge Deficits related to treatment of sinusitis  Nurse Case Manager Clinical Goal(s):  [name] Over the next 10 days, patient will have resolution of sinusitis symptoms  Interventions:  . Chart reviewed including recent hospital ED notes and office notes o Patient had office visit with Dr Livia Snellen yesterday o Prescribed ceftin and given betamethasone steroid injection . Medications reviewed and discussed with patient o He has not started Ceftin yet but it will be delivered today o Advised that Steroid injection may cause an increase in blood sugar for a few days . Previously discussed using saline nasal rinses, flonase, and Coricidin . Advised patient to reach out to PCP if symptoms have not resolved over the next week or if he develops new symptoms or if current symptoms worsen  Patient Self Care Activities:  . Patient verbalizes understanding of plan to sinusitis . Performs ADL's independently . Performs IADL's independently  Please see past updates related to this goal by clicking on the "Past Updates" button in the selected goal         The patient verbalized understanding of instructions provided today and declined a print copy of patient instruction materials.   Follow-up Plan The care management team will reach out to the patient again over the next 45 days.   Chong Sicilian, BSN, RN-BC Embedded Chronic Care Manager [facility] Direct Dial: [phone]

## 2019-04-26 NOTE — Patient Instructions (Addendum)
Medication Instructions:  Your physician recommends that you continue on your current medications as directed. Please refer to the Current Medication list given to you today.  Labwork: None ordered.  Testing/Procedures: None ordered.  Follow-Up:  The following dates are available for procedures:  March 15, 30 April 1, 15   Any Other Special Instructions Will Be Listed Below (If Applicable).  If you need a refill on your cardiac medications before your next appointment, please call your pharmacy.    Subcutaneous Cardioverter Defibrillator Implantation A subcutaneous implantable cardioverter defibrillator (S-ICD) is a device that identifies and corrects abnormal heart rhythms. Subcutaneous cardioverter defibrillator implantation is a surgery to place an S-ICD under the skin in the chest. An S-ICD has a battery, a small computer (pulse generator), and a device that moves electrical currents (electrode). The S-ICD detects and corrects two types of dangerous irregular heartbeats (arrhythmias):  A rapid heart rhythm (ventricular tachycardia).  The lower chambers of the heart (ventricles) contracting in an uncoordinated way (ventricular fibrillation). When the S-ICD detects tachycardia or fibrillation, it sends a shock to the heart that attempts to restore the heartbeat to normal (defibrillation). The shock may feel like a strong jolt in the chest. Your health care provider may recommend S-ICD implantation if:  You had an arrhythmia that started in the ventricles.  Your heart has damage from disease or a heart condition.  Your heart muscle is weak. There is another type of ICD called the traditional ICD (transvenous ICD) that you and your health care provider may consider. With a transvenous ICD, electrodes are placed through a large blood vessel and into the heart wall, and the shock is delivered directly to the heart tissue. This is different from an S-ICD. With an S-ICD, electrodes do  not touch the blood vessels or heart, and the shock is absorbed through the chest. Your health care provider may recommend implantation of an S-ICD instead of a traditional ICD if:  You have vascular disease (blood vessel disorder).  You have medical conditions that increase your risk for infection.  You do not need a pacemaker. Talk with your health care provider about the benefits of S-ICD. Tell a health care provider about:  Any allergies you have.  All medicines you are taking, including vitamins, herbs, eye drops, creams, and over-the-counter medicines.  Any problems you or family members have had with anesthetic medicines.  Any blood disorders you have.  Any surgeries you have had.  Any medical conditions you have.  Whether you are pregnant or may be pregnant. What are the risks? Generally, this is a safe procedure. However, problems may occur, including:  Infection.  Bleeding.  Allergic reactions to medicines or dyes.  Failure to shock or correct the arrhythmia.  Swelling and bruising.  Blood clots. What happens before the procedure? Staying hydrated Follow instructions from your health care provider about hydration.  Up to 2 hours before the procedure - you may continue to drink clear liquids, such as water, clear fruit juice, black coffee, and plain tea. Eating and drinking restrictions Follow instructions from your health care provider about eating and drinking, which may include:  8 hours before the procedure - stop eating heavy meals or foods such as meat, fried foods, or fatty foods.  6 hours before the procedure - stop eating light meals or foods, such as toast or cereal.  6 hours before the procedure - stop drinking milk or drinks that contain milk.  2 hours before the procedure - stop  drinking clear liquids. Medicines  Ask your health care provider about: ? Changing or stopping your regular medicines. This is especially important if you are  taking diabetes medicines or blood thinners. ? Taking over-the-counter medicines, vitamins, herbs, and supplements. ? Taking medicines such as aspirin and ibuprofen. These medicines can thin your blood. Do not take these medicines unless your health care provider tells you to take them.  You may be prescribed antibiotic medicine to take before the procedure to help prevent infection. If so, take the antibiotic as told by your health care provider. Do not stop taking the antibiotic even if you start to feel better. General instructions  Do not use any products that contain nicotine or tobacco, such as cigarettes and e-cigarettes. If you need help quitting, ask your health care provider.  You may have blood tests.  You may have a test to check the electrical signals in your heart (electrocardiogram, ECG).  You may have imaging tests, such as a chest X-ray.  You may be asked to shower with a germ-killing soap.  Plan to have a responsible adult care for you for at least 24 hours after you leave the hospital or clinic. This is important. What happens during the procedure?  To lower your risk of infection: ? Your health care team will wash or sanitize their hands. ? Your skin will be washed with soap. ? Hair may be removed from the surgical area.  An IV will be inserted into one of your veins.  You will be given one or more of the following: ? A medicine to help you relax (sedative). ? A medicine to make you fall asleep (general anesthetic).  A small incision will be made on the left side of your chest near your rib cage, under your arm.  A pocket (pouch) will be made in the skin on the lower part of your chest. The S-ICD pulse generator will be inserted into this pocket.  An electrode will be placed under the skin in your chest, near your breastbone (sternum).  The electrode will be attached to the S-ICD pulse generator.  The S-ICD will be tested to make sure it is working.  Your  incision will be closed with stitches (sutures).  A bandage (dressing) may be placed over your incision. The procedure may vary among health care providers and hospitals. What happens after the procedure?  Your blood pressure, heart rate, breathing rate, and blood oxygen level will be monitored until the medicines you were given have worn off.  You will be given pain medicine as needed.  You may have a chest X-ray to check the S-ICD.  You will get an identification card explaining that you have an S-ICD. You will need to keep this card with you at all times.  Do not drive until your heart care provider approves. Summary  A subcutaneous cardioverter defibrillator implantation is a type of surgery to implant a device that corrects dangerous irregular heartbeats (arrhythmias).  During the surgery, a subcutaneous implantable cardioverter defibrillator (S-ICD) is placed under the skin in the chest.  The S-ICD electrodes rest in the chest, but they do not directly touch the heart or blood vessels. This information is not intended to replace advice given to you by your health care provider. Make sure you discuss any questions you have with your health care provider. Document Revised: 04/05/2018 Document Reviewed: 05/12/2016 Elsevier Patient Education  [place].

## 2019-04-29 ENCOUNTER — Other Ambulatory Visit: Payer: Self-pay | Admitting: Family Medicine

## 2019-04-29 ENCOUNTER — Encounter: Payer: Self-pay | Admitting: Family Medicine

## 2019-04-29 DIAGNOSIS — K219 Gastro-esophageal reflux disease without esophagitis: Secondary | ICD-10-CM

## 2019-04-29 DIAGNOSIS — I10 Essential (primary) hypertension: Secondary | ICD-10-CM

## 2019-04-30 LAB — FE+TIBC+FER+B12+FOLIC
Ferritin: 319 ng/mL (ref 30–400)
Folate: 9.4 ng/mL (ref >3.0)
Iron Saturation: 24 % (ref 15–55)
Iron: 44 ug/dL (ref 38–169)
Total Iron Binding Capacity: 186 ug/dL — ABNORMAL LOW (ref 250–450)
UIBC: 142 ug/dL (ref 111–343)
Vitamin B-12: 1160 pg/mL (ref 232–1245)

## 2019-04-30 LAB — SPECIMEN STATUS REPORT

## 2019-05-03 ENCOUNTER — Other Ambulatory Visit: Payer: Medicare Other

## 2019-05-03 ENCOUNTER — Telehealth: Payer: Self-pay | Admitting: Internal Medicine

## 2019-05-03 ENCOUNTER — Other Ambulatory Visit: Payer: Self-pay

## 2019-05-03 DIAGNOSIS — D649 Anemia, unspecified: Secondary | ICD-10-CM

## 2019-05-03 DIAGNOSIS — R6889 Other general symptoms and signs: Secondary | ICD-10-CM | POA: Diagnosis not present

## 2019-05-03 NOTE — Telephone Encounter (Signed)
Attempted to call Pt.  Call went to VM.  VM was full.

## 2019-05-03 NOTE — Telephone Encounter (Signed)
  Patient states that he has decided he would like to go ahead with having a pacemaker put in as discussed with the doctor. He would like to get the appointment set up to have this done.

## 2019-05-04 LAB — ANEMIA PROFILE B
Basophils Absolute: 0.1 10*3/uL (ref 0.0–0.2)
Basos: 1 %
EOS (ABSOLUTE): 0.2 10*3/uL (ref 0.0–0.4)
Eos: 3 %
Ferritin: 359 ng/mL (ref 30–400)
Folate: 16.7 ng/mL (ref >3.0)
Hematocrit: 32.7 % — ABNORMAL LOW (ref 37.5–51.0)
Hemoglobin: 10.5 g/dL — ABNORMAL LOW (ref 13.0–17.7)
Immature Grans (Abs): 0.1 10*3/uL (ref 0.0–0.1)
Immature Granulocytes: 1 %
Iron Saturation: 33 % (ref 15–55)
Iron: 66 ug/dL (ref 38–169)
Lymphocytes Absolute: 1.7 10*3/uL (ref 0.7–3.1)
Lymphs: 20 %
MCH: 27.5 pg (ref 26.6–33.0)
MCHC: 32.1 g/dL (ref 31.5–35.7)
MCV: 86 fL (ref 79–97)
Monocytes Absolute: 0.7 10*3/uL (ref 0.1–0.9)
Monocytes: 8 %
Neutrophils Absolute: 5.8 10*3/uL (ref 1.4–7.0)
Neutrophils: 67 %
Platelets: 285 10*3/uL (ref 150–450)
RBC: 3.82 x10E6/uL — ABNORMAL LOW (ref 4.14–5.80)
RDW: 14.4 % (ref 11.6–15.4)
Retic Ct Pct: 2.3 % (ref 0.6–2.6)
Total Iron Binding Capacity: 202 ug/dL — ABNORMAL LOW (ref 250–450)
UIBC: 136 ug/dL (ref 111–343)
Vitamin B-12: 1375 pg/mL — ABNORMAL HIGH (ref 232–1245)
WBC: 8.6 10*3/uL (ref 3.4–10.8)

## 2019-05-06 NOTE — Telephone Encounter (Signed)
Attempted to call Pt.  Call went to VM.  VM full.

## 2019-05-07 ENCOUNTER — Other Ambulatory Visit: Payer: Medicare Other

## 2019-05-07 ENCOUNTER — Ambulatory Visit (INDEPENDENT_AMBULATORY_CARE_PROVIDER_SITE_OTHER): Payer: Medicare Other | Admitting: Family Medicine

## 2019-05-07 DIAGNOSIS — D649 Anemia, unspecified: Secondary | ICD-10-CM | POA: Diagnosis not present

## 2019-05-07 DIAGNOSIS — Z5329 Procedure and treatment not carried out because of patient's decision for other reasons: Secondary | ICD-10-CM

## 2019-05-07 DIAGNOSIS — Z91199 Patient's noncompliance with other medical treatment and regimen due to unspecified reason: Secondary | ICD-10-CM

## 2019-05-07 NOTE — Telephone Encounter (Signed)
Attempted to call Pt x 3.  Will await further needs.

## 2019-05-07 NOTE — Progress Notes (Signed)
Attempted to call several times. No answer. Voicemail full and unable to leave message. Pt will have to reschedule appointment.

## 2019-05-08 ENCOUNTER — Encounter: Payer: Medicare Other | Admitting: Family Medicine

## 2019-05-08 ENCOUNTER — Encounter: Payer: Self-pay | Admitting: Family Medicine

## 2019-05-08 LAB — FECAL OCCULT BLOOD, IMMUNOCHEMICAL: Fecal Occult Bld: NEGATIVE

## 2019-05-08 NOTE — Progress Notes (Signed)
Hello Brad Mendoza,  Your lab result is normal and/or stable.Some minor variations that are not significant are commonly marked abnormal, but do not represent any medical problem for you.  Best regards, Devon Pretty, M.D.

## 2019-05-08 NOTE — Progress Notes (Signed)
Patient did not answer after multiple attempts at 2:11, 2:34 and 2:55 PM. Voice mailbox was full and I was unable to leave a message.

## 2019-05-09 NOTE — Telephone Encounter (Signed)
Letter mailed to Pt requesting to call office with best phone number and time to call d/t unable to reach Pt x3 outreaches.

## 2019-05-13 ENCOUNTER — Ambulatory Visit (INDEPENDENT_AMBULATORY_CARE_PROVIDER_SITE_OTHER): Payer: Medicare Other | Admitting: Family

## 2019-05-13 ENCOUNTER — Encounter: Payer: Self-pay | Admitting: Family

## 2019-05-13 DIAGNOSIS — J0100 Acute maxillary sinusitis, unspecified: Secondary | ICD-10-CM | POA: Diagnosis not present

## 2019-05-13 MED ORDER — DOXYCYCLINE HYCLATE 100 MG PO TABS: 100.0000 mg | ORAL_TABLET | Freq: Two times a day (BID) | ORAL | 0 refills | Status: DC

## 2019-05-13 NOTE — Telephone Encounter (Signed)
Follow Up   Patient states that he had lost his phone he has now found his phone. He can be reached at [phone]. Please call.

## 2019-05-13 NOTE — Progress Notes (Signed)
Virtual Visit via telephone Note Due to COVID-19 pandemic this visit was conducted virtually. This visit type was conducted due to national recommendations for restrictions regarding the COVID-19 Pandemic (e.g. social distancing, sheltering in place) in an effort to limit this patient's exposure and mitigate transmission in our community. All issues noted in this document were discussed and addressed.  A physical exam was not performed with this format.  I connected with Brad Mendoza on 05/13/19 at 4:34 pm by telephone and verified that I am speaking with the correct person using two identifiers. Brad Mendoza is currently located at home and no one is currently with him during visit. The provider, Evelina Dun, FNP is located in their office at time of visit.  I discussed the limitations, risks, security and privacy concerns of performing an evaluation and management service by telephone and the availability of in person appointments. I also discussed with the patient that there may be a patient responsible charge related to this service. The patient expressed understanding and agreed to proceed.   History and Present Illness:  Pt calls the office today with recurrent sinus pain that started 2-3 months ago. He reports his left face is numb and has a needle feeling that comes and and goes. He is complaining of increased congestion and left ear pain. He was seen on 04/25/19 and given a steroid injection and started on Ceftin 250 mg BID for 10 days. He states his symptoms are the same. He has been taking his flonase daily.  Sinusitis This is a recurrent problem. The current episode started more than 1 month ago. The problem is unchanged. There has been no fever. His pain is at a severity of 7/10. The pain is moderate. Associated symptoms include congestion, ear pain (left ear), sinus pressure and sneezing. Pertinent negatives include no coughing, headaches, hoarse voice or sore throat. Past  treatments include antibiotics. The treatment provided mild relief.      Review of Systems  HENT: Positive for congestion, ear pain (left ear), sinus pressure and sneezing. Negative for hoarse voice and sore throat.   Respiratory: Negative for cough.   Neurological: Negative for headaches.  All other systems reviewed and are negative.    Observations/Objective: No SOB or distress noted   Assessment and Plan: 1. Acute maxillary sinusitis, recurrence not specified Will go ahead and given doxycycline today, if not improvement will need to see ENT.  - Take meds as prescribed - Use a cool mist humidifier  -Use saline nose sprays frequently -Force fluids -For any cough or congestion  Use plain Mucinex- regular strength or max strength is fine -For fever or aces or pains- take tylenol or ibuprofen. -Throat lozenges if help -Call in one week if symptoms worsen or do not improve - doxycycline (VIBRA-TABS) 100 MG tablet; Take 1 tablet (100 mg total) by mouth 2 (two) times daily.  Dispense: 20 tablet; Refill: 0     I discussed the assessment and treatment plan with the patient. The patient was provided an opportunity to ask questions and all were answered. The patient agreed with the plan and demonstrated an understanding of the instructions.   The patient was advised to call back or seek an in-person evaluation if the symptoms worsen or if the condition fails to improve as anticipated.  The above assessment and management plan was discussed with the patient. The patient verbalized understanding of and has agreed to the management plan. Patient is aware to call the clinic if  symptoms persist or worsen. Patient is aware when to return to the clinic for a follow-up visit. Patient educated on when it is appropriate to go to the emergency department.   Time call ended:  4:46 pm   I provided 12 minutes of non-face-to-face time during this encounter.    Evelina Dun, FNP

## 2019-05-16 NOTE — Telephone Encounter (Signed)
Patient states he is calling to follow up in regards to scheduling to have defibrillator put in. He states he is requesting to have appointment scheduled. Please assist.

## 2019-05-17 DIAGNOSIS — N189 Chronic kidney disease, unspecified: Secondary | ICD-10-CM | POA: Diagnosis not present

## 2019-05-17 DIAGNOSIS — N170 Acute kidney failure with tubular necrosis: Secondary | ICD-10-CM | POA: Diagnosis not present

## 2019-05-17 DIAGNOSIS — R809 Proteinuria, unspecified: Secondary | ICD-10-CM | POA: Diagnosis not present

## 2019-05-17 DIAGNOSIS — I129 Hypertensive chronic kidney disease with stage 1 through stage 4 chronic kidney disease, or unspecified chronic kidney disease: Secondary | ICD-10-CM | POA: Diagnosis not present

## 2019-05-17 DIAGNOSIS — E211 Secondary hyperparathyroidism, not elsewhere classified: Secondary | ICD-10-CM | POA: Diagnosis not present

## 2019-05-20 ENCOUNTER — Telehealth: Payer: Self-pay

## 2019-05-20 DIAGNOSIS — I5042 Chronic combined systolic (congestive) and diastolic (congestive) heart failure: Secondary | ICD-10-CM

## 2019-05-20 NOTE — Telephone Encounter (Signed)
Outreach made to pt regarding scheduling subcut ICD  Advised Pt would need to be first case because he requires anesthesia-arrival time of 5:30 am  Pt will call his transportation company to see if they would be able to drive him at that time.  Also advised would need labs/covid test prior to procedure.  Will attempt to schedule labs/covid test on April 2  Procedure on April 5  Will call Pt back later today to confirm

## 2019-05-20 NOTE — Telephone Encounter (Signed)
Pt scheduled for subcut icd on April 5  Will plan  For labs/covid test April 2  Instruction letter complete  Will plan on meeting with pt this friday

## 2019-05-21 ENCOUNTER — Telehealth: Payer: Self-pay | Admitting: Licensed Clinical Social Worker

## 2019-05-21 ENCOUNTER — Telehealth: Payer: Self-pay | Admitting: Internal Medicine

## 2019-05-21 NOTE — Telephone Encounter (Signed)
New Message:     Pt says he needs to talk to you about his COVID Test scheduled for Friday please.

## 2019-05-21 NOTE — Telephone Encounter (Signed)
CSW received referral to assist patient with transportation to Covid testing. Patient lives in [place] with his spouse and has transport options through his insurance but will not transport patient for Covid testing. Patient is scheduled for a procedure next week and needs Covid test for procedure. CSW made arrangements with Alger Simons at [place] to transport patient to and from [facility] as patient states he has transport to the [facility] [address] office for lab work. Patient instructed to meet Danny outside [facility] office at 10:30 and meet up with Kasandra Knudsen who will hold a sign labeled "[employer]". Patient will return to [facility] office for his appointment in the lab and be transported back home to [place] via [employer] provided transport. Patient verbalizes understanding and will return call to CSW if needed. Raquel Sarna, [place], [place]

## 2019-05-21 NOTE — Telephone Encounter (Signed)
See my note about arrangements for this patient for Covid testing. Let me know if anything else is needed.   Routing comment   Louann Liv, LCSW 22 minutes ago (10:26 AM)     CSW received referral to assist patient with transportation to Covid testing. Patient lives in [place] with his spouse and has transport options through his insurance but will not transport patient for Covid testing. Patient is scheduled for a procedure next week and needs Covid test for procedure. CSW made arrangements with Alger Simons at [place] to transport patient to and from [facility] as patient states he has transport to the [facility] [address] office for lab work. Patient instructed to meet Danny outside [facility] office at 10:30 and meet up with Kasandra Knudsen who will hold a sign labeled "[employer]". Patient will return to [facility] office for his appointment in the lab and be transported back home to [place] via [employer] provided transport. Patient verbalizes understanding and will return call to CSW if needed. Raquel Sarna, [place], [place]       Documentation      Recent Patient Communication

## 2019-05-24 ENCOUNTER — Other Ambulatory Visit (HOSPITAL_COMMUNITY)
Admission: RE | Admit: 2019-05-24 | Discharge: 2019-05-24 | Disposition: A | Discharge status: Home / Self Care | Payer: Medicare Other | Source: Ambulatory Visit | Attending: Internal Medicine | Admitting: Internal Medicine

## 2019-05-24 ENCOUNTER — Other Ambulatory Visit: Payer: Medicare Other | Admitting: *Deleted

## 2019-05-24 ENCOUNTER — Other Ambulatory Visit: Payer: Self-pay

## 2019-05-24 DIAGNOSIS — Z20822 Contact with and (suspected) exposure to covid-19: Secondary | ICD-10-CM | POA: Diagnosis not present

## 2019-05-24 DIAGNOSIS — Z01812 Encounter for preprocedural laboratory examination: Secondary | ICD-10-CM | POA: Diagnosis not present

## 2019-05-24 DIAGNOSIS — I5042 Chronic combined systolic (congestive) and diastolic (congestive) heart failure: Secondary | ICD-10-CM

## 2019-05-24 DIAGNOSIS — R6889 Other general symptoms and signs: Secondary | ICD-10-CM | POA: Diagnosis not present

## 2019-05-24 LAB — BASIC METABOLIC PANEL
BUN/Creatinine Ratio: 21 (ref 10–24)
BUN: 43 mg/dL — ABNORMAL HIGH (ref 8–27)
CO2: 24 mmol/L (ref 20–29)
Calcium: 8.6 mg/dL (ref 8.6–10.2)
Chloride: 107 mmol/L — ABNORMAL HIGH (ref 96–106)
Creatinine, Ser: 2.01 mg/dL — ABNORMAL HIGH (ref 0.76–1.27)
GFR calc Af Amer: 40 mL/min/{1.73_m2} — ABNORMAL LOW (ref >59)
GFR calc non Af Amer: 34 mL/min/{1.73_m2} — ABNORMAL LOW (ref >59)
Glucose: 189 mg/dL — ABNORMAL HIGH (ref 65–99)
Potassium: 5.1 mmol/L (ref 3.5–5.2)
Sodium: 141 mmol/L (ref 134–144)

## 2019-05-24 LAB — SARS CORONAVIRUS 2 (TAT 6-24 HRS): SARS Coronavirus 2: NEGATIVE

## 2019-05-26 NOTE — Anesthesia Preprocedure Evaluation (Addendum)
Anesthesia Evaluation  Patient identified by MRN, date of birth, ID band Patient awake    Reviewed: Allergy & Precautions, NPO status , Patient's Chart, lab work & pertinent test results  Airway Mallampati: II  TM Distance: >3 FB Neck ROM: Full    Dental  (+) Edentulous Upper, Edentulous Lower, Dental Advisory Given   Pulmonary neg pulmonary ROS,    Pulmonary exam normal breath sounds clear to auscultation       Cardiovascular hypertension, + angina + CAD and +CHF  Normal cardiovascular exam Rhythm:Regular Rate:Normal  Echo 01/2019 1. Left ventricular ejection fraction, by visual estimation, is 30 to 35%. The left ventricle has moderate to severely decreased function. Left ventricular septal wall thickness was mildly increased. Mildly increased left ventricular posterior wall thickness. There is mildly increased left ventricular hypertrophy.  2. Elevated left ventricular end-diastolic pressure.  3. Left ventricular diastolic parameters are consistent with Grade I diastolic dysfunction (impaired relaxation).  4. The left ventricle demonstrates global hypokinesis.  5. Global right ventricle has normal systolic function.The right ventricular size is normal. No increase in right ventricular wall thickness.  6. Left atrial size was normal.  7. Right atrial size was normal.  8. The mitral valve is normal in structure. No evidence of mitral valve regurgitation. No evidence of mitral stenosis.  9. The tricuspid valve is normal in structure. Tricuspid valve  regurgitation is mild.  10. The aortic valve is tricuspid. Aortic valve regurgitation is not visualized. No evidence of aortic valve sclerosis or stenosis.  11. The pulmonic valve was normal in structure. Pulmonic valve regurgitation is not visualized.  12. Moderately elevated pulmonary artery systolic pressure.  13. The inferior vena cava is normal in size with greater than 50%  respiratory variability, suggesting right atrial pressure of 3 mmHg.    Neuro/Psych    GI/Hepatic Neg liver ROS, GERD  ,  Endo/Other  negative endocrine ROSdiabetes  Renal/GU Renal disease     Musculoskeletal negative musculoskeletal ROS (+)   Abdominal   Peds  Hematology negative hematology ROS (+)   Anesthesia Other Findings Day of surgery medications reviewed with the patient.  Reproductive/Obstetrics                           Anesthesia Physical Anesthesia Plan  ASA: III  Anesthesia Plan: General   Post-op Pain Management:    Induction: Intravenous  PONV Risk Score and Plan: 3 and Ondansetron, Dexamethasone and Treatment may vary due to age or medical condition  Airway Management Planned: Oral ETT  Additional Equipment: None  Intra-op Plan:   Post-operative Plan: Extubation in OR  Informed Consent: I have reviewed the patients History and Physical, chart, labs and discussed the procedure including the risks, benefits and alternatives for the proposed anesthesia with the patient or authorized representative who has indicated his/her understanding and acceptance.     Dental advisory given  Plan Discussed with: CRNA  Anesthesia Plan Comments:         Anesthesia Quick Evaluation

## 2019-05-27 ENCOUNTER — Encounter (HOSPITAL_COMMUNITY): Payer: Self-pay | Admitting: Internal Medicine

## 2019-05-27 ENCOUNTER — Ambulatory Visit (HOSPITAL_COMMUNITY)
Admission: RE | Disposition: A | Discharge status: Home / Self Care | Payer: Medicare Other | Source: Home / Self Care | Attending: Internal Medicine

## 2019-05-27 ENCOUNTER — Other Ambulatory Visit: Payer: Self-pay

## 2019-05-27 ENCOUNTER — Other Ambulatory Visit (HOSPITAL_COMMUNITY): Payer: Self-pay | Admitting: Internal Medicine

## 2019-05-27 ENCOUNTER — Ambulatory Visit (HOSPITAL_COMMUNITY): Payer: Medicare Other | Admitting: Certified Registered Nurse Anesthetist

## 2019-05-27 ENCOUNTER — Ambulatory Visit (HOSPITAL_COMMUNITY)
Admission: RE | Admit: 2019-05-27 | Discharge: 2019-05-27 | Disposition: A | Discharge status: Home / Self Care | Payer: Medicare Other | Attending: Internal Medicine | Admitting: Internal Medicine

## 2019-05-27 DIAGNOSIS — E1122 Type 2 diabetes mellitus with diabetic chronic kidney disease: Secondary | ICD-10-CM | POA: Diagnosis not present

## 2019-05-27 DIAGNOSIS — Z791 Long term (current) use of non-steroidal anti-inflammatories (NSAID): Secondary | ICD-10-CM | POA: Diagnosis not present

## 2019-05-27 DIAGNOSIS — I5043 Acute on chronic combined systolic (congestive) and diastolic (congestive) heart failure: Secondary | ICD-10-CM | POA: Diagnosis not present

## 2019-05-27 DIAGNOSIS — E119 Type 2 diabetes mellitus without complications: Secondary | ICD-10-CM | POA: Diagnosis not present

## 2019-05-27 DIAGNOSIS — I130 Hypertensive heart and chronic kidney disease with heart failure and stage 1 through stage 4 chronic kidney disease, or unspecified chronic kidney disease: Secondary | ICD-10-CM | POA: Insufficient documentation

## 2019-05-27 DIAGNOSIS — K219 Gastro-esophageal reflux disease without esophagitis: Secondary | ICD-10-CM | POA: Diagnosis not present

## 2019-05-27 DIAGNOSIS — Z882 Allergy status to sulfonamides status: Secondary | ICD-10-CM | POA: Insufficient documentation

## 2019-05-27 DIAGNOSIS — E785 Hyperlipidemia, unspecified: Secondary | ICD-10-CM | POA: Insufficient documentation

## 2019-05-27 DIAGNOSIS — I5042 Chronic combined systolic (congestive) and diastolic (congestive) heart failure: Secondary | ICD-10-CM | POA: Diagnosis present

## 2019-05-27 DIAGNOSIS — N184 Chronic kidney disease, stage 4 (severe): Secondary | ICD-10-CM | POA: Diagnosis not present

## 2019-05-27 DIAGNOSIS — I2510 Atherosclerotic heart disease of native coronary artery without angina pectoris: Secondary | ICD-10-CM | POA: Insufficient documentation

## 2019-05-27 DIAGNOSIS — K5790 Diverticulosis of intestine, part unspecified, without perforation or abscess without bleeding: Secondary | ICD-10-CM | POA: Diagnosis not present

## 2019-05-27 DIAGNOSIS — Z7982 Long term (current) use of aspirin: Secondary | ICD-10-CM | POA: Insufficient documentation

## 2019-05-27 DIAGNOSIS — Z79899 Other long term (current) drug therapy: Secondary | ICD-10-CM | POA: Diagnosis not present

## 2019-05-27 DIAGNOSIS — Z794 Long term (current) use of insulin: Secondary | ICD-10-CM | POA: Insufficient documentation

## 2019-05-27 DIAGNOSIS — Z955 Presence of coronary angioplasty implant and graft: Secondary | ICD-10-CM | POA: Diagnosis not present

## 2019-05-27 DIAGNOSIS — Z7901 Long term (current) use of anticoagulants: Secondary | ICD-10-CM | POA: Diagnosis not present

## 2019-05-27 DIAGNOSIS — Z880 Allergy status to penicillin: Secondary | ICD-10-CM | POA: Insufficient documentation

## 2019-05-27 DIAGNOSIS — N183 Chronic kidney disease, stage 3 unspecified: Secondary | ICD-10-CM | POA: Diagnosis not present

## 2019-05-27 DIAGNOSIS — Z006 Encounter for examination for normal comparison and control in clinical research program: Secondary | ICD-10-CM | POA: Diagnosis not present

## 2019-05-27 DIAGNOSIS — F17290 Nicotine dependence, other tobacco product, uncomplicated: Secondary | ICD-10-CM | POA: Diagnosis not present

## 2019-05-27 DIAGNOSIS — I428 Other cardiomyopathies: Secondary | ICD-10-CM | POA: Diagnosis not present

## 2019-05-27 DIAGNOSIS — R6889 Other general symptoms and signs: Secondary | ICD-10-CM | POA: Diagnosis not present

## 2019-05-27 DIAGNOSIS — I5022 Chronic systolic (congestive) heart failure: Secondary | ICD-10-CM | POA: Diagnosis not present

## 2019-05-27 HISTORY — PX: SUBQ ICD IMPLANT: EP1223

## 2019-05-27 LAB — GLUCOSE, CAPILLARY
Glucose-Capillary: 121 mg/dL — ABNORMAL HIGH (ref 70–99)
Glucose-Capillary: 137 mg/dL — ABNORMAL HIGH (ref 70–99)

## 2019-05-27 SURGERY — SUBQ ICD IMPLANT
Anesthesia: General

## 2019-05-27 MED ORDER — PHENYLEPHRINE 40 MCG/ML (10ML) SYRINGE FOR IV PUSH (FOR BLOOD PRESSURE SUPPORT)
PREFILLED_SYRINGE | INTRAVENOUS | Status: DC | PRN
  Administered 2019-05-27 (×5): 80 ug via INTRAVENOUS

## 2019-05-27 MED ORDER — FENTANYL CITRATE (PF) 100 MCG/2ML IJ SOLN
INTRAMUSCULAR | Status: DC | PRN
  Administered 2019-05-27: 50 ug via INTRAVENOUS
  Administered 2019-05-27 (×2): 25 ug via INTRAVENOUS

## 2019-05-27 MED ORDER — VANCOMYCIN HCL IN DEXTROSE 1-5 GM/200ML-% IV SOLN: 1000.0000 mg | Freq: Once | INTRAVENOUS | Status: AC

## 2019-05-27 MED ORDER — SODIUM CHLORIDE 0.9 % IV SOLN
INTRAVENOUS | Status: AC
  Filled 2019-05-27: qty 2

## 2019-05-27 MED ORDER — VANCOMYCIN HCL IN DEXTROSE 1-5 GM/200ML-% IV SOLN
1000.0000 mg | INTRAVENOUS | Status: AC
  Administered 2019-05-27: 1000 mg via INTRAVENOUS
  Filled 2019-05-27: qty 200

## 2019-05-27 MED ORDER — HEPARIN (PORCINE) IN NACL 2-0.9 UNITS/ML
INTRAMUSCULAR | Status: AC | PRN
  Administered 2019-05-27: 500 mL

## 2019-05-27 MED ORDER — VANCOMYCIN HCL IN DEXTROSE 1-5 GM/200ML-% IV SOLN
INTRAVENOUS | Status: AC
  Administered 2019-05-27: 1000 mg via INTRAVENOUS
  Filled 2019-05-27: qty 200

## 2019-05-27 MED ORDER — FENTANYL CITRATE (PF) 100 MCG/2ML IJ SOLN
25.0000 ug | INTRAMUSCULAR | Status: DC | PRN
  Administered 2019-05-27: 50 ug via INTRAVENOUS

## 2019-05-27 MED ORDER — ONDANSETRON HCL 4 MG/2ML IJ SOLN: 4.0000 mg | Freq: Four times a day (QID) | INTRAMUSCULAR | Status: DC | PRN

## 2019-05-27 MED ORDER — SODIUM CHLORIDE 0.9 % IV SOLN
80.0000 mg | INTRAVENOUS | Status: AC
  Administered 2019-05-27: 80 mg
  Filled 2019-05-27: qty 2

## 2019-05-27 MED ORDER — DEXAMETHASONE SODIUM PHOSPHATE 10 MG/ML IJ SOLN
INTRAMUSCULAR | Status: DC | PRN
  Administered 2019-05-27: 4 mg via INTRAVENOUS

## 2019-05-27 MED ORDER — BUPIVACAINE HCL (PF) 0.25 % IJ SOLN
INTRAMUSCULAR | Status: AC
  Filled 2019-05-27: qty 60

## 2019-05-27 MED ORDER — PROPOFOL 10 MG/ML IV BOLUS
INTRAVENOUS | Status: DC | PRN
  Administered 2019-05-27: 130 mg via INTRAVENOUS

## 2019-05-27 MED ORDER — SODIUM CHLORIDE 0.9 % IV SOLN: INTRAVENOUS | Status: DC | PRN

## 2019-05-27 MED ORDER — PHENYLEPHRINE HCL-NACL 10-0.9 MG/250ML-% IV SOLN
INTRAVENOUS | Status: DC | PRN
  Administered 2019-05-27: 25 ug/min via INTRAVENOUS

## 2019-05-27 MED ORDER — XARELTO 2.5 MG PO TABS: 2.5000 mg | ORAL_TABLET | Freq: Two times a day (BID) | ORAL | 6 refills | Status: DC

## 2019-05-27 MED ORDER — ACETAMINOPHEN 325 MG PO TABS: 325.0000 mg | ORAL_TABLET | ORAL | Status: DC | PRN

## 2019-05-27 MED ORDER — LIDOCAINE 2% (20 MG/ML) 5 ML SYRINGE
INTRAMUSCULAR | Status: DC | PRN
  Administered 2019-05-27: 100 mg via INTRAVENOUS

## 2019-05-27 MED ORDER — SODIUM CHLORIDE 0.9 % IV SOLN: INTRAVENOUS | Status: DC

## 2019-05-27 MED ORDER — VANCOMYCIN HCL IN DEXTROSE 1-5 GM/200ML-% IV SOLN
INTRAVENOUS | Status: AC
  Filled 2019-05-27: qty 200

## 2019-05-27 MED ORDER — BUPIVACAINE HCL (PF) 0.25 % IJ SOLN
INTRAMUSCULAR | Status: DC | PRN
  Administered 2019-05-27: 60 mL

## 2019-05-27 MED ORDER — FENTANYL CITRATE (PF) 100 MCG/2ML IJ SOLN
INTRAMUSCULAR | Status: AC
  Filled 2019-05-27: qty 2

## 2019-05-27 MED ORDER — LIDOCAINE HCL 1 % IJ SOLN
INTRAMUSCULAR | Status: AC
  Filled 2019-05-27: qty 60

## 2019-05-27 MED ORDER — MIDAZOLAM HCL 5 MG/5ML IJ SOLN
INTRAMUSCULAR | Status: DC | PRN
  Administered 2019-05-27 (×2): 1 mg via INTRAVENOUS

## 2019-05-27 MED ORDER — HEPARIN (PORCINE) IN NACL 1000-0.9 UT/500ML-% IV SOLN
INTRAVENOUS | Status: AC
  Filled 2019-05-27: qty 500

## 2019-05-27 MED ORDER — PHENYLEPHRINE HCL (PRESSORS) 10 MG/ML IV SOLN
INTRAVENOUS | Status: DC | PRN
  Administered 2019-05-27 (×2): 40 ug via INTRAVENOUS

## 2019-05-27 MED ORDER — SUGAMMADEX SODIUM 200 MG/2ML IV SOLN
INTRAVENOUS | Status: DC | PRN
  Administered 2019-05-27: 200 mg via INTRAVENOUS

## 2019-05-27 MED ORDER — ONDANSETRON HCL 4 MG/2ML IJ SOLN
INTRAMUSCULAR | Status: DC | PRN
  Administered 2019-05-27: 4 mg via INTRAVENOUS

## 2019-05-27 MED ORDER — ROCURONIUM BROMIDE 10 MG/ML (PF) SYRINGE
PREFILLED_SYRINGE | INTRAVENOUS | Status: DC | PRN
  Administered 2019-05-27: 50 mg via INTRAVENOUS

## 2019-05-27 SURGICAL SUPPLY — 5 items
CABLE SURGICAL S-101-97-12 (CABLE) ×3 IMPLANT
ICD SUBCU MRI EMBLEM A219 (ICD Generator) ×3 IMPLANT
LEAD SUBQU EMBLEM 3501 (Pacemaker) ×3 IMPLANT
PAD PRO RADIOLUCENT 2001M-C (PAD) ×6 IMPLANT
TRAY PACEMAKER INSERTION (PACKS) ×3 IMPLANT

## 2019-05-27 NOTE — Discharge Instructions (Signed)
Subcutaneous Cardioverter Defibrillator Implantation, Care After This sheet gives you information about how to care for yourself after your procedure. Your health care provider may also give you more specific instructions. If you have problems or questions, contact your health care provider. What can I expect after the procedure? After the procedure, it is common to have:  Some pain. It may last a few days.  A slight bump under the skin where the subcutaneous implantation cardioverter defibrillator (S-ICD) is. You may be able to feel the device under the skin. This is normal. Follow these instructions at home: Medicines  Take over-the-counter and prescription medicines only as told by your health care provider.  If you were prescribed an antibiotic medicine, take it as told by your health care provider. Do not stop taking the antibiotic even if you start to feel better. Incision care      Follow instructions from your health care provider about how to take care of your incision. Make sure you: ? Wash your hands with soap and water before you change your bandage (dressing). If soap and water are not available, use hand sanitizer. ? Change your dressing as told by your health care provider. ? Leave stitches (sutures), skin glue, or adhesive strips in place. These skin closures may need to stay in place for 2 weeks or longer. If adhesive strip edges start to loosen and curl up, you may trim the loose edges. Do not remove adhesive strips completely unless your health care provider tells you to do that.  Check your incision area every day for signs of infection. Check for: ? Redness, swelling, or pain. ? Fluid or blood. ? Warmth. ? Pus or a bad smell. Activity  Do not lift anything that is heavier than 10 lb (4.5 kg), or the limit that you are told, until your health care provider says that it is safe.  Avoid sports and any other activity that could cause a hit to the generator or leads.  Ask your health care provider what activities are safe for you and when you may return to your normal activities.  Follow instructions from your health care provider about exercise and sexual activity restrictions after your procedure. Electric and magnetic [place]  Tell all health care providers, including your dentist, that you have a defibrillator. They need to know this so they do not give you an MRI scan, which uses strong magnets.  When using your cell phone, hold it to the ear that is on the opposite side from the defibrillator. Do not leave your cell phone in a pocket over the defibrillator.  If you must pass through a metal detector, quickly walk through it. Do not stop under the detector, and do not stand near it.  Avoid places or objects that have a strong electric or magnetic field, including: ? Airport Herbalist. At the airport, tell officials that you have a defibrillator. Your defibrillator ID card will let you be checked in a way that is safe for you and will not damage your defibrillator. Also, do not let a security person wave a magnetic wand near your defibrillator. That can make it stop working. ? Power plants. ? Large electrical generators. ? Anti-theft systems or electronic article surveillance (EAS). ? Radiofrequency transmission towers, such as cell phone and radio towers.  Do not use amateur (ham) radio equipment or electric (arc) welding torches.  Some devices are safe to use if they are held 12 inches (30 cm) or more away from  your defibrillator. These include power tools, lawn mowers, and speakers. If you are not sure if something is safe to use, ask your health care provider.  Do not use MP3 player headphones. They have magnets.  You may safely use electric blankets, heating pads, computers, and microwave ovens. General instructions  Do not take baths, swim, shower, or use a hot tub until your health care provider approves. You may need to take sponge baths  until your health care provider says that you may bathe or shower.  Do not drive until your health care provider approves.  Always keep your defibrillator ID card with you. The card should list the implant date, device model, and manufacturer. Consider wearing a medical alert bracelet or necklace that says that you have an S-ICD.  Do not use any products that contain nicotine or tobacco, such as cigarettes and e-cigarettes. If you need help quitting, ask your health care provider.  Have your defibrillator checked as often as told by your health care provider. Most S-ICDs last for 4-8 years before they need to be replaced. Contact a health care provider if:  You feel one shock in your chest.  You gain weight suddenly.  You have a fever.  You have severe pain, and medicines do not help.  You have redness, swelling, or pain around your incision area.  You have pus or a bad smell coming from your incision area.  You have fluid or blood coming from your incision.  Your incision area feels warm to the touch.  Your heart feels like it is fluttering or skipping beats (heart palpitations).  You feel increased anxiety or depression. Get help right away if:  You feel more than one shock.  You have chest pain.  You have problems breathing or have shortness of breath.  You have dizziness or fainting. Summary  After the procedure, you may have some pain, see a bump under your skin, and feel the device under your skin.  Check your incision area every day for signs of infection.  Be careful around electric and magnetic [place].  Always keep your defibrillator ID card with you. This information is not intended to replace advice given to you by your health care provider. Make sure you discuss any questions you have with your health care provider. Document Revised: 05/08/2017 Document Reviewed: 05/12/2016 Elsevier Patient Education  [place].

## 2019-05-27 NOTE — Anesthesia Procedure Notes (Signed)
Procedure Name: Intubation Date/Time: 05/27/2019 8:08 AM Performed by: Colin Benton, CRNA Pre-anesthesia Checklist: Patient identified, Emergency Drugs available, Suction available and Patient being monitored Patient Re-evaluated:Patient Re-evaluated prior to induction Oxygen Delivery Method: Circle system utilized Preoxygenation: Pre-oxygenation with 100% oxygen Induction Type: IV induction Ventilation: Mask ventilation without difficulty Laryngoscope Size: Miller and 2 Grade View: Grade I Tube type: Oral Tube size: 7.5 mm Number of attempts: 1 Airway Equipment and Method: Stylet Placement Confirmation: ETT inserted through vocal cords under direct vision,  positive ETCO2 and breath sounds checked- equal and bilateral Secured at: 23 cm Tube secured with: Tape Dental Injury: Teeth and Oropharynx as per pre-operative assessment

## 2019-05-27 NOTE — Transfer of Care (Signed)
Immediate Anesthesia Transfer of Care Note  Patient: Brad Mendoza  Procedure(s) Performed: Naoma Diener ICD IMPLANT (N/A )  Patient Location: Cath Lab  Anesthesia Type:General  Level of Consciousness: awake, alert , oriented and patient cooperative  Airway & Oxygen Therapy: Patient Spontanous Breathing and Patient connected to nasal cannula oxygen  Post-op Assessment: Report given to RN and Post -op Vital signs reviewed and stable  Post vital signs: Reviewed and stable  Last Vitals:  Vitals Value Taken Time  BP 130/77 05/27/19 0951  Temp 36.8 C 05/27/19 0953  Pulse 77 05/27/19 0953  Resp 23 05/27/19 0953  SpO2 96 % 05/27/19 0953  Vitals shown include unvalidated device data.  Last Pain:  Vitals:   05/27/19 0953  TempSrc: Temporal  PainSc: Asleep         Complications: No apparent anesthesia complications

## 2019-05-27 NOTE — H&P (Signed)
HPI Brad Mendoza is referred today by Dr. Greggory Brandy to consider S-ICD insertion. The patient is a pleasant 64 yo man with a non-ischemic CM, chronic systolic heart failure dating back over 6 years. He has been on guideline directed medical therapy. He has not had syncope. He has had progressive kidney dysfunction. He has CAD and prior stent to the RCA but his CAD is not thought to be the cause of his CM. His creatinine was in the 3's but yesterday was 2.1.  Allergies  Allergen Reactions  . Sulfa Antibiotics Shortness Of Breath  . Penicillins Rash and Other (See Comments)    Has patient had a PCN reaction causing immediate rash, facial/tongue/throat swelling, SOB or lightheadedness with hypotension: No Has patient had a PCN reaction causing severe rash involving mucus membranes or skin necrosis: No Has patient had a PCN reaction that required hospitalization No Has patient had a PCN reaction occurring within the last 10 years: No If all of the above answers are "NO", then may proceed with Cephalosporin use.            Current Outpatient Medications  Medication Sig Dispense Refill  . aspirin EC 81 MG tablet Take 81 mg by mouth daily.    . blood glucose meter kit and supplies KIT Dispense based on patient and insurance preference. Test four times daily as directed. (FOR ICD-10 : E11.9 1 each 0  . Blood Glucose Monitoring Suppl (ONETOUCH VERIO) w/Device KIT 1 Device by Does not apply route 2 (two) times daily. 1 kit 0  . Capsaicin (ICY HOT ARTHRITIS THERAPY EX) Apply 1 application topically 2 (two) times daily as needed (back pain).     . carvedilol (COREG) 6.25 MG tablet Take 1 tablet (6.25 mg total) by mouth 2 (two) times daily with a meal. 180 tablet 1  . cefUROXime (CEFTIN) 250 MG tablet Take 1 tablet (250 mg total) by mouth 2 (two) times daily with a meal for 10 days. For sinuses 20 tablet 0  . cetirizine (ZYRTEC) 10 MG tablet Take 1 tablet (10 mg total) by mouth daily. For itching  90 tablet 3  . Cholecalciferol (VITAMIN D3) 25 MCG (1000 UT) CAPS Take 2,000 Units by mouth daily.    . fluticasone (FLONASE) 50 MCG/ACT nasal spray USE 2 SPRAYS IN EACH NOSTRIL DAILY 48 g 1  . glucose blood (ONETOUCH VERIO) test strip USE FOUR TIMES DAILY AS NEEDED Dx E11.9 100 each 11  . hydrALAZINE (APRESOLINE) 100 MG tablet TAKE (1) TABLET BY MOUTH (3) TIMES DAILY. 90 tablet 0  . icosapent Ethyl (VASCEPA) 1 g capsule Take 2 capsules (2 g total) by mouth 2 (two) times daily. 360 capsule 1  . insulin glargine, 2 Unit Dial, (TOUJEO MAX SOLOSTAR) 300 UNIT/ML Solostar Pen Inject 50 units each morning and 40 units each evening 33 mL 2  . isosorbide mononitrate (IMDUR) 120 MG 24 hr tablet TAKE (1) TABLET BY MOUTH ONCE DAILY. 30 tablet 0  . loratadine (CLARITIN) 10 MG tablet Take 1 tablet (10 mg total) by mouth daily. 30 tablet 11  . Multiple Vitamins-Minerals (CENTRUM SILVER ADULT 50+ PO) Take 1 tablet by mouth daily.    . nabumetone (RELAFEN) 500 MG tablet TAKE TWO TABLETS BY MOUTH TWICE DAILY 360 tablet 1  . nitroGLYCERIN (NITROSTAT) 0.4 MG SL tablet DISSOLVE 1 TAB UNDER TOUNGE FOR CHEST PAIN. MAY REPEAT EVERY 5 MINUTES FOR 3 DOSES. IF NO RELIEF CALL 911 OR GO TO ER 25 tablet 2  .  Omega-3 Fatty Acids (FISH OIL) 1000 MG CPDR Take 1,000 mg by mouth daily.     . pantoprazole (PROTONIX) 40 MG tablet TAKE ONE TABLET BY MOUTH TWICE DAILY. 60 tablet 0  . pregabalin (LYRICA) 200 MG capsule Take 2 capsules (400 mg total) by mouth at bedtime. 60 capsule 2  . PROAIR HFA 108 (90 Base) MCG/ACT inhaler USE 2 PUFFS EVERY 6 HOURS AS NEEDED 8.5 g 3  . ranolazine (RANEXA) 500 MG 12 hr tablet TAKE 1 TABLET BY MOUTH TWICE DAILY. 60 tablet 0  . rivaroxaban (XARELTO) 2.5 MG TABS tablet Take 1 tablet (2.5 mg total) by mouth 2 (two) times daily. 60 tablet 6  . rosuvastatin (CRESTOR) 20 MG tablet Take 1 tablet (20 mg total) by mouth daily. 90 tablet 1  . Semaglutide,0.25 or 0.5MG/DOS, (OZEMPIC, 0.25 OR 0.5 MG/DOSE,)  2 MG/1.5ML SOPN Inject 0.5 mg into the skin once a week. 1 pen 2  . terbinafine (LAMISIL) 1 % cream Apply 1 application topically 2 (two) times daily. Apply to both feet and between toes 30 g 1  . torsemide (DEMADEX) 20 MG tablet TAKE 3 TABLETS BY MOUTH DAILY. 90 tablet 0            Current Facility-Administered Medications  Medication Dose Route Frequency Provider Last Rate Last Admin  . aflibercept (EYLEA) SOLN 2 mg  2 mg Intravitreal  [name], MD   2 mg at 04/25/18 6789         Past Medical History:  Diagnosis Date  . Acid reflux   . Back pain    f4 and f5  . CAD (coronary artery disease)   . CHF (congestive heart failure) ([place])    last echo was in March 2016  . Diabetes mellitus 2005  . Diverticulosis   . H/O chest pain 2005   ECHO  . HTN (hypertension)   . Hyperlipidemia   . Nonischemic cardiomyopathy ([place])   . Onychomycosis   . Polysubstance abuse ([place])     ROS:   All systems reviewed and negative except as noted in the HPI.        Past Surgical History:  Procedure Laterality Date  . APPENDECTOMY    . CARDIAC CATHETERIZATION  2005   4 frech cath  . COLONOSCOPY N/A 08/01/2013   Procedure: COLONOSCOPY;  Surgeon: Rogene Houston, MD;  [facility];  Service: Endoscopy;  Laterality: N/A;  rescheduled to [place] notified pt  . CORONARY STENT INTERVENTION N/A 03/19/2018   Procedure: CORONARY STENT INTERVENTION;  Surgeon: Leonie Man, MD;  Location: [place] [facility];  Service: Cardiovascular;  Laterality: N/A;  . ESOPHAGOGASTRODUODENOSCOPY N/A 04/23/2015   Procedure: ESOPHAGOGASTRODUODENOSCOPY (EGD);  Surgeon: Rogene Houston, MD;  [facility];  Service: Endoscopy;  Laterality: N/A;  1:25  . RIGHT/LEFT HEART CATH AND CORONARY ANGIOGRAPHY N/A 06/03/2016   Procedure: Right/Left Heart Cath and Coronary Angiography;  Surgeon: Belva Crome, MD;  Location: [place] [facility] LAB;  Service: Cardiovascular;   Laterality: N/A;  . RIGHT/LEFT HEART CATH AND CORONARY ANGIOGRAPHY N/A 03/19/2018   Procedure: RIGHT/LEFT HEART CATH AND CORONARY ANGIOGRAPHY;  Surgeon: Jolaine Artist, MD;  Location: [place] [facility];  Service: Cardiovascular;  Laterality: N/A;          Family History  Problem Relation Age of Onset  . Hypertension Father   . Diabetes Father   . Cancer Mother   . Alcohol abuse Brother   . Colon cancer  Neg Hx      Social History        Socioeconomic History  . Marital status: Legally Separated    Spouse name: Not on file  . Number of children: 2  . Years of education: 4  . Highest education level: 4th grade  Occupational History  . Occupation: disabled  Tobacco Use  . Smoking status: Never Smoker  . Smokeless tobacco: Current User    Types: Snuff  . Tobacco comment: dips snuff about 6 cans/week for 3 years  Substance and Sexual Activity  . Alcohol use: Not Currently    Alcohol/week: 1.0 standard drinks    Types: 1 Cans of beer per week    Comment: occ - has cut down, now once every 2-3 months  . Drug use: Yes    Types: Marijuana    Comment: occasional use - smoking less  . Sexual activity: Yes    Birth control/protection: None  Other Topics Concern  . Not on file  Social History Narrative   Lives in [place] with spouse   Disabled.   Social Determinants of Health      Financial Resource Strain: Low Risk   . Difficulty of Paying Living Expenses: Not hard at all  Food Insecurity: No Food Insecurity  . Worried About Charity fundraiser in the Last Year: Never true  . Ran Out of Food in the Last Year: Never true  Transportation Needs: No Transportation Needs  . Lack of Transportation (Medical): No  . Lack of Transportation (Non-Medical): No  Physical Activity: Inactive  . Days of Exercise per Week: 0 days  . Minutes of Exercise per Session: 0 min  Stress: No Stress Concern Present  . Feeling of Stress : Not at all   Social Connections: Unknown  . Frequency of Communication with Friends and Family: More than three times a week  . Frequency of Social Gatherings with Friends and Family: More than three times a week  . Attends Religious Services: 1 to 4 times per year  . Active Member of Clubs or Organizations: No  . Attends Archivist Meetings: Never  . Marital Status: Patient refused  Intimate Partner Violence: Not At Risk  . Fear of Current or Ex-Partner: No  . Emotionally Abused: No  . Physically Abused: No  . Sexually Abused: No     BP 136/72   Pulse 69   Ht 5' 9"  (1.753 m)   Wt 213 lb 12.8 oz (97 kg)   SpO2 (!) 88%   BMI 31.57 kg/m   Physical Exam:  Well appearing NAD HEENT: Unremarkable Neck:  No JVD, no thyromegally Lymphatics:  No adenopathy Back:  No CVA tenderness Lungs:  Clear with no wheezes HEART:  Regular rate rhythm, no murmurs, no rubs, no clicks Abd:  soft, positive bowel sounds, no organomegally, no rebound, no guarding Ext:  2 plus pulses, no edema, no cyanosis, no clubbing Skin:  No rashes no nodules Neuro:  CN II through XII intact, motor grossly intact  EKG - reviewed. NSR with a narrow QRS  Assess/Plan: 1. Non-ischemic CM - he has class 2 symptoms and an EF of 30%. I have discussed the indications/risks/benefits/goals/expectations of ICD insertion. He will call us if he wishes to proceed with a [employer]. 2. CAD - he denies anginal symptoms. He will continue his current meds. 3. HTN - his blood pressure is fairly well controlled. No change in his meds. 4. Stage 3 renal insufficiency -his  creatinine has actually improved and he does not have any evidence of volume overload. We will follow.  Ponciano Ort.  EP Attending  Patient seen and examined. Agree with the findings as noted above. Since his last clinic visit, no change in the plan. For S-ICD.   Mikle Bosworth.D.

## 2019-05-28 MED FILL — Heparin Sod (Porcine)-NaCl IV Soln 1000 Unit/500ML-0.9%: INTRAVENOUS | Qty: 500 | Status: AC

## 2019-05-28 NOTE — Anesthesia Postprocedure Evaluation (Signed)
Anesthesia Post Note  Patient: Brad Mendoza  Procedure(s) Performed: SUBQ ICD IMPLANT (N/A )     Patient location during evaluation: PACU Anesthesia Type: General Level of consciousness: sedated and patient cooperative Pain management: pain level controlled Vital Signs Assessment: post-procedure vital signs reviewed and stable Respiratory status: spontaneous breathing Cardiovascular status: stable Anesthetic complications: no    Last Vitals:  Vitals:   05/27/19 1645 05/27/19 1745  BP: (!) 166/93 (!) 171/93  Pulse: 84   Resp: (!) 21   Temp:    SpO2: 98%     Last Pain:  Vitals:   05/28/19 1417  TempSrc:   PainSc: 0-No pain                 Nolon Nations

## 2019-05-29 ENCOUNTER — Telehealth: Payer: Self-pay

## 2019-05-29 ENCOUNTER — Telehealth: Payer: Self-pay | Admitting: Internal Medicine

## 2019-05-29 MED ORDER — ACETAMINOPHEN-CODEINE #3 300-30 MG PO TABS: 1.0000 | ORAL_TABLET | ORAL | 0 refills | Status: DC | PRN

## 2019-05-29 NOTE — Telephone Encounter (Signed)
Per Dr. Lovena Le  Pt may have 10 tabs of Tylenol #3.  Call placed to Pt's pharmacy with order. NO REFILLS  Also advised Pt he will start Xarelto this Friday.  Advised Pt to take one tylenol 3 in the am, take 1000 mg tylenol at lunch time, and then another tylenol 3 at bedtime.  Pt indicates understanding.

## 2019-05-29 NOTE — Telephone Encounter (Signed)
Pt calling stating that he would like for Dr. Lovena Le to prescribe him something for pain for the procedure that he had done. Pt would like a call back concerning this matter. Please address

## 2019-05-29 NOTE — Telephone Encounter (Signed)
New message     Pt had a defibulator procedure on Monday the 5th.  He want to know if he can have pain medication called in to [facility] in [place].  Also, patient want to know when can he start taking his xarelto.  Please call

## 2019-05-29 NOTE — Telephone Encounter (Signed)
Duplicate message.  See other phone note from today.

## 2019-06-04 ENCOUNTER — Encounter (HOSPITAL_COMMUNITY): Payer: Medicare Other

## 2019-06-04 ENCOUNTER — Encounter: Payer: Medicare Other | Admitting: Vascular Surgery

## 2019-06-04 ENCOUNTER — Other Ambulatory Visit (HOSPITAL_COMMUNITY): Payer: Medicare Other

## 2019-06-06 ENCOUNTER — Other Ambulatory Visit: Payer: Self-pay

## 2019-06-06 ENCOUNTER — Ambulatory Visit (INDEPENDENT_AMBULATORY_CARE_PROVIDER_SITE_OTHER): Payer: Medicare Other | Admitting: *Deleted

## 2019-06-06 DIAGNOSIS — I428 Other cardiomyopathies: Secondary | ICD-10-CM

## 2019-06-10 LAB — CUP PACEART INCLINIC DEVICE CHECK
Date Time Interrogation Session: 20210415120000
Implantable Lead Implant Date: 20210405
Implantable Lead Location: 753862
Implantable Lead Model: 3501
Implantable Lead Serial Number: 178864
Implantable Pulse Generator Implant Date: 20210405
Pulse Gen Serial Number: 135565

## 2019-06-10 NOTE — Progress Notes (Signed)
Subcutaneous ICD and wound check in clinic. Dressings and steri strips removed from wound sites x 3. Wound edges approximated on all sites with no drainage, redness, edema or bleeding present. 0 untreated episodes; 0 treated episodes; 2 shocks delivered at time of implant. Electrode impedance status okay. No programming changes. Remaining longevity to ERI 100%. Follow up with DR Lovena Le 09/02/19 and next remote scheduled for 09/02/19.

## 2019-06-24 ENCOUNTER — Other Ambulatory Visit (HOSPITAL_COMMUNITY): Payer: Self-pay | Admitting: Internal Medicine

## 2019-06-25 ENCOUNTER — Encounter: Payer: Self-pay | Admitting: Family Medicine

## 2019-06-25 ENCOUNTER — Telehealth (INDEPENDENT_AMBULATORY_CARE_PROVIDER_SITE_OTHER): Payer: Medicare Other | Admitting: Family Medicine

## 2019-06-25 DIAGNOSIS — I5043 Acute on chronic combined systolic (congestive) and diastolic (congestive) heart failure: Secondary | ICD-10-CM | POA: Diagnosis not present

## 2019-06-25 MED ORDER — POTASSIUM CHLORIDE CRYS ER 20 MEQ PO TBCR: 20.0000 meq | EXTENDED_RELEASE_TABLET | Freq: Every day | ORAL | 3 refills | Status: DC

## 2019-06-25 NOTE — Progress Notes (Signed)
Subjective:    Patient ID: Brad Mendoza, male    DOB: 1955-08-18, 64 y.o.   MRN: [id number]   HPI: Brad Mendoza is a 64 y.o. male presenting for swelling in feeet and legs. Gets dyspneic with laying down. Props on several pillows. No daytime dyspnea. No chest pain.BP in normal range Taking demadex 20 mg, 2 daily. It is ordered as 3 a day, but he says he thinks thee are only two in the pack.   Depression screen [facility] 2/9 09/19/2018 07/27/2018 04/10/2018 01/25/2018 12/14/2016  Decreased Interest 0 0 0 0 1  Down, Depressed, Hopeless 0 0 0 0 0  PHQ - 2 Score 0 0 0 0 1  Some recent data might be hidden     Relevant past medical, surgical, family and social history reviewed and updated as indicated.  Interim medical history since our last visit reviewed. Allergies and medications reviewed and updated.  ROS:  Review of Systems  Constitutional: Negative for fever.  HENT: Positive for congestion.   Respiratory: Positive for shortness of breath (at night, orthopneic). Negative for cough.   Cardiovascular: Positive for leg swelling. Negative for chest pain.  Musculoskeletal: Negative for arthralgias.  Skin: Negative for rash.     Social History   Tobacco Use  Smoking Status Never Smoker  Smokeless Tobacco Current User  . Types: Snuff  Tobacco Comment   dips snuff about 6 cans/week for 3 years       Objective:     Wt Readings from Last 3 Encounters:  05/27/19 211 lb (95.7 kg)  04/26/19 213 lb 12.8 oz (97 kg)  04/25/19 211 lb 12.8 oz (96.1 kg)     Exam deferred. Pt. [place] due to COVID 19. Phone visit performed.   Assessment & Plan:   1. Acute on chronic combined systolic and diastolic congestive heart failure ([place])     Meds ordered this encounter  Medications  . potassium chloride SA (KLOR-CON) 20 MEQ tablet    Sig: Take 1 tablet (20 mEq total) by mouth daily. As a potassium supplement    Dispense:  30 tablet    Refill:  3    No orders of the defined types  were placed in this encounter.  Increase the Demadex by 1 pill daily to get rid of the swelling.  He believes he is only taking 2 a day so he should increase to 320 mg tablets daily.  However when he rechecks his pill pack if he discovers she is already taking 3 pills daily he should go on up to 4 a day.  I ordered potassium as I do not see any potassium sparing medication in his regimen.  He should let me know if he continues to get more short of breath or more swollen in spite of this an he should have blood work in a week.  Diagnoses and all orders for this visit:  Acute on chronic combined systolic and diastolic congestive heart failure ([place])  Other orders -     potassium chloride SA (KLOR-CON) 20 MEQ tablet; Take 1 tablet (20 mEq total) by mouth daily. As a potassium supplement    Virtual Visit via telephone Note  I discussed the limitations, risks, security and privacy concerns of performing an evaluation and management service by telephone and the availability of in person appointments. The patient was identified with two identifiers. Pt.expressed understanding and agreed to proceed. Pt. Is at home. Dr. Livia Snellen is in his office.  Follow  Up Instructions:   I discussed the assessment and treatment plan with the patient. The patient was provided an opportunity to ask questions and all were answered. The patient agreed with the plan and demonstrated an understanding of the instructions.   The patient was advised to call back or seek an in-person evaluation if the symptoms worsen or if the condition fails to improve as anticipated.   Total minutes including chart review and phone contact time: 16   Follow up plan: Return in about 1 week (around 07/02/2019).  [name], MD [place]

## 2019-06-27 ENCOUNTER — Encounter: Payer: Self-pay | Admitting: Cardiovascular Disease

## 2019-06-27 ENCOUNTER — Telehealth (INDEPENDENT_AMBULATORY_CARE_PROVIDER_SITE_OTHER): Payer: Medicare Other | Admitting: Cardiovascular Disease

## 2019-06-27 VITALS — BP 174/94 | HR 90 | Ht 69.0 in | Wt 218.0 lb

## 2019-06-27 DIAGNOSIS — N184 Chronic kidney disease, stage 4 (severe): Secondary | ICD-10-CM

## 2019-06-27 DIAGNOSIS — I25118 Atherosclerotic heart disease of native coronary artery with other forms of angina pectoris: Secondary | ICD-10-CM | POA: Diagnosis not present

## 2019-06-27 DIAGNOSIS — Z955 Presence of coronary angioplasty implant and graft: Secondary | ICD-10-CM | POA: Diagnosis not present

## 2019-06-27 DIAGNOSIS — I5023 Acute on chronic systolic (congestive) heart failure: Secondary | ICD-10-CM | POA: Diagnosis not present

## 2019-06-27 DIAGNOSIS — I10 Essential (primary) hypertension: Secondary | ICD-10-CM

## 2019-06-27 DIAGNOSIS — I428 Other cardiomyopathies: Secondary | ICD-10-CM

## 2019-06-27 DIAGNOSIS — E785 Hyperlipidemia, unspecified: Secondary | ICD-10-CM

## 2019-06-27 MED ORDER — METOLAZONE 2.5 MG PO TABS: 2.5000 mg | ORAL_TABLET | Freq: Every day | ORAL | 0 refills | Status: DC

## 2019-06-27 NOTE — Addendum Note (Signed)
Addended by: Laurine Blazer on: 06/27/2019 02:13 PM   Modules accepted: Orders

## 2019-06-27 NOTE — Progress Notes (Signed)
Virtual Visit via Telephone Note   This visit type was conducted due to national recommendations for restrictions regarding the COVID-19 Pandemic (e.g. social distancing) in an effort to limit this patient's exposure and mitigate transmission in our community.  Due to his co-morbid illnesses, this patient is at least at moderate risk for complications without adequate follow up.  This format is felt to be most appropriate for this patient at this time.  The patient did not have access to video technology/had technical difficulties with video requiring transitioning to audio format only (telephone).  All issues noted in this document were discussed and addressed.  No physical exam could be performed with this format.  Please refer to the patient's chart for his  consent to telehealth for [facility].   Date:  06/27/2019   ID:  Brad Mendoza, DOB 28-Dec-1955, MRN [id number]  Patient Location: Home Provider Location: Home  PCP:  [name], MD  Cardiologist:  Kate Sable, MD  Electrophysiologist:  None   Evaluation Performed:  Follow-Up Visit  Chief Complaint:  CHF  History of Present Illness:    Brad Mendoza is a 64 y.o. male with chronic kidney disease stage IV, mixed ischemic/nonischemic cardiomyopathy, hypertension, diabetes mellitus, and coronary artery disease.  Cardiomyopathy dates back to 2014.  He underwent right left heart catheterization in January 2020 and underwent drug-eluting stent placement to the proximal RCA.  Echocardiogram in December 2020 demonstrated LVEF 30 to 35% with normal right ventricular size and systolic function.  He underwent subcutaneous ICD insertion on 05/27/2019 by Dr. Lovena Le.  He denies chest pain.  He has noticed increasing feet swelling over the past 2 to 3 weeks and now has increasing shortness of breath.  He is unable to lie down flat.  He was instructed by his PCP on 06/25/2019 to take 80 mg of torsemide daily rather than 60 mg.  He  did this yesterday and will do so today.  He is yet to take his morning medications and is hypertensive.  He has not weighed himself today.  The weight reported below is from yesterday.   Past Medical History:  Diagnosis Date  . Acid reflux   . Back pain    f4 and f5  . CAD (coronary artery disease)   . CHF (congestive heart failure) ([place])    last echo was in March 2016  . Diabetes mellitus 2005  . Diverticulosis   . H/O chest pain 2005   ECHO  . HTN (hypertension)   . Hyperlipidemia   . Nonischemic cardiomyopathy ([place])   . Onychomycosis   . Polysubstance abuse [place])    Past Surgical History:  Procedure Laterality Date  . APPENDECTOMY    . CARDIAC CATHETERIZATION  2005   4 frech cath  . COLONOSCOPY N/A 08/01/2013   Procedure: COLONOSCOPY;  Surgeon: Rogene Houston, MD;  [facility];  Service: Endoscopy;  Laterality: N/A;  rescheduled to [place] notified pt  . CORONARY STENT INTERVENTION N/A 03/19/2018   Procedure: CORONARY STENT INTERVENTION;  Surgeon: Leonie Man, MD;  Location: [place] CV LAB;  Service: Cardiovascular;  Laterality: N/A;  . ESOPHAGOGASTRODUODENOSCOPY N/A 04/23/2015   Procedure: ESOPHAGOGASTRODUODENOSCOPY (EGD);  Surgeon: Rogene Houston, MD;  [facility];  Service: Endoscopy;  Laterality: N/A;  1:25  . RIGHT/LEFT HEART CATH AND CORONARY ANGIOGRAPHY N/A 06/03/2016   Procedure: Right/Left Heart Cath and Coronary Angiography;  Surgeon: Belva Crome, MD;  Location: [place] CV LAB;  Service: Cardiovascular;  Laterality: N/A;  . RIGHT/LEFT HEART CATH AND CORONARY ANGIOGRAPHY N/A 03/19/2018   Procedure: RIGHT/LEFT HEART CATH AND CORONARY ANGIOGRAPHY;  Surgeon: Jolaine Artist, MD;  Location: [place] CV LAB;  Service: Cardiovascular;  Laterality: N/A;  . SUBQ ICD IMPLANT N/A 05/27/2019   Procedure: SUBQ ICD IMPLANT;  Surgeon: Evans Lance, MD;  Location: [place] CV LAB;  Service: Cardiovascular;  Laterality: N/A;      Current Meds  Medication Sig  . acetaminophen-codeine (TYLENOL #3) 300-30 MG tablet Take 1 tablet by mouth every 4 (four) hours as needed for moderate pain.  [name] aspirin EC 81 MG tablet Take 81 mg by mouth daily.  . blood glucose meter kit and supplies KIT Dispense based on patient and insurance preference. Test four times daily as directed. (FOR ICD-10 : E11.9  . Blood Glucose Monitoring Suppl (ONETOUCH VERIO) w/Device KIT 1 Device by Does not apply route 2 (two) times daily.  . Capsaicin (ICY HOT ARTHRITIS THERAPY EX) Apply 1 application topically 2 (two) times daily as needed (back pain).   . carvedilol (COREG) 6.25 MG tablet Take 1 tablet (6.25 mg total) by mouth 2 (two) times daily with a meal.  . Cholecalciferol (VITAMIN D3) 25 MCG (1000 UT) CAPS Take 2,000 Units by mouth daily.  . fluticasone (FLONASE) 50 MCG/ACT nasal spray USE 2 SPRAYS IN EACH NOSTRIL DAILY  . glucose blood (ONETOUCH VERIO) test strip USE FOUR TIMES DAILY AS NEEDED Dx E11.9  . hydrALAZINE (APRESOLINE) 100 MG tablet TAKE (1) TABLET BY MOUTH (3) TIMES DAILY.  [name] icosapent Ethyl (VASCEPA) 1 g capsule Take 2 capsules (2 g total) by mouth 2 (two) times daily.  . insulin glargine, 2 Unit Dial, (TOUJEO MAX SOLOSTAR) 300 UNIT/ML Solostar Pen Inject 50 units each morning and 40 units each evening  . isosorbide mononitrate (IMDUR) 120 MG 24 hr tablet TAKE (1) TABLET BY MOUTH ONCE DAILY.  [name] loratadine (CLARITIN) 10 MG tablet Take 1 tablet (10 mg total) by mouth daily.  . Multiple Vitamins-Minerals (CENTRUM SILVER ADULT 50+ PO) Take 1 tablet by mouth daily.  . nabumetone (RELAFEN) 500 MG tablet TAKE TWO TABLETS BY MOUTH TWICE DAILY  . nitroGLYCERIN (NITROSTAT) 0.4 MG SL tablet DISSOLVE 1 TAB UNDER TOUNGE FOR CHEST PAIN. MAY REPEAT EVERY 5 MINUTES FOR 3 DOSES. IF NO RELIEF CALL 911 OR GO TO ER  . Omega-3 Fatty Acids (FISH OIL) 1000 MG CPDR Take 1,000 mg by mouth daily.   . pantoprazole (PROTONIX) 40 MG tablet TAKE ONE TABLET BY  MOUTH TWICE DAILY.  [name] potassium chloride SA (KLOR-CON) 20 MEQ tablet Take 1 tablet (20 mEq total) by mouth daily. As a potassium supplement  . pregabalin (LYRICA) 200 MG capsule Take 2 capsules (400 mg total) by mouth at bedtime.  [name] PROAIR HFA 108 (90 Base) MCG/ACT inhaler USE 2 PUFFS EVERY 6 HOURS AS NEEDED  . ranolazine (RANEXA) 500 MG 12 hr tablet TAKE 1 TABLET BY MOUTH TWICE DAILY.  . rivaroxaban (XARELTO) 2.5 MG TABS tablet Take 1 tablet (2.5 mg total) by mouth 2 (two) times daily.  . rosuvastatin (CRESTOR) 10 MG tablet Take 10 mg by mouth daily.  . Semaglutide,0.25 or 0.5MG/DOS, (OZEMPIC, 0.25 OR 0.5 MG/DOSE,) 2 MG/1.5ML SOPN Inject 0.5 mg into the skin once a week.  . terbinafine (LAMISIL) 1 % cream Apply 1 application topically 2 (two) times daily. Apply to both feet and between toes  . torsemide (DEMADEX) 20 MG tablet TAKE 3 TABLETS  BY MOUTH DAILY.     Allergies:   Sulfa antibiotics and Penicillins   Social History   Tobacco Use  . Smoking status: Never Smoker  . Smokeless tobacco: Current User    Types: Snuff  . Tobacco comment: dips snuff about 6 cans/week for 3 years  Substance Use Topics  . Alcohol use: Not Currently    Alcohol/week: 1.0 standard drinks    Types: 1 Cans of beer per week    Comment: occ - has cut down, now once every 2-3 months  . Drug use: Yes    Types: Marijuana    Comment: occasional use - smoking less     Family Hx: The patient's family history includes Alcohol abuse in his brother; Cancer in his mother; Diabetes in his father; Hypertension in his father. There is no history of Colon cancer.  ROS:   Please see the history of present illness.     All other systems reviewed and are negative.   Prior CV studies:   The following studies were reviewed today:  Coronary stent intervention 03/19/2018:   Prox RCA lesion is 90% stenosed.  A drug-eluting stent was successfully placed using a STENT SYNERGY DES 2.5X16. Postdilated to 2.8 mm  Post  intervention, there is a 0% residual stenosis.  Mid RCA lesion is 70% stenosed -stable from prior cath. Will be treated medically.   SUMMARY   Successful DES PCI to proximal RCA lesion with a Synergy DES 2.5 mg 16 mm postdilated to 2.8 mm.  Echocardiogram 01/22/2019:   1. Left ventricular ejection fraction, by visual estimation, is 30 to 35%. The left ventricle has moderate to severely decreased function. Left ventricular septal wall thickness was mildly increased. Mildly increased left ventricular posterior wall  thickness. There is mildly increased left ventricular hypertrophy.  2. Elevated left ventricular end-diastolic pressure.  3. Left ventricular diastolic parameters are consistent with Grade I diastolic dysfunction (impaired relaxation).  4. The left ventricle demonstrates global hypokinesis.  5. Global right ventricle has normal systolic function.The right ventricular size is normal. No increase in right ventricular wall thickness.  6. Left atrial size was normal.  7. Right atrial size was normal.  8. The mitral valve is normal in structure. No evidence of mitral valve regurgitation. No evidence of mitral stenosis.  9. The tricuspid valve is normal in structure. Tricuspid valve regurgitation is mild. 10. The aortic valve is tricuspid. Aortic valve regurgitation is not visualized. No evidence of aortic valve sclerosis or stenosis. 11. The pulmonic valve was normal in structure. Pulmonic valve regurgitation is not visualized. 12. Moderately elevated pulmonary artery systolic pressure. 13. The inferior vena cava is normal in size with greater than 50% respiratory variability, suggesting right atrial pressure of 3 mmHg.  Labs/Other Tests and Data Reviewed:    EKG:  No ECG reviewed.  Recent Labs: 12/20/2018: B Natriuretic Peptide 105.2 04/02/2019: TSH 1.969 04/25/2019: ALT 26 05/03/2019: Hemoglobin 10.5; Platelets 285 05/24/2019: BUN 43; Creatinine, Ser 2.01; Potassium 5.1; Sodium 141     Recent Lipid Panel Lab Results  Component Value Date/Time   CHOL 310 (H) 01/22/2019 04:00 PM   CHOL 248 (H) 07/27/2018 02:43 PM   CHOL 277 (H) 07/11/2012 01:08 PM   TRIG 519 (H) 01/22/2019 04:00 PM   TRIG 658 (HH) 12/16/2013 02:59 PM   TRIG 522 (H) 07/11/2012 01:08 PM   HDL 38 (L) 01/22/2019 04:00 PM   HDL 31 (L) 07/27/2018 02:43 PM   HDL 42 12/16/2013 02:59 PM  HDL 39 (L) 07/11/2012 01:08 PM   CHOLHDL 8.2 01/22/2019 04:00 PM   LDLCALC UNABLE TO CALCULATE IF TRIGLYCERIDE OVER 400 mg/dL 01/22/2019 04:00 PM   LDLCALC Comment 07/27/2018 02:43 PM   LDLCALC 144 (H) 05/10/2013 12:25 PM   LDLCALC 134 (H) 07/11/2012 01:08 PM   LDLDIRECT 135.2 (H) 01/22/2019 04:00 PM    Wt Readings from Last 3 Encounters:  06/27/19 218 lb (98.9 kg)  05/27/19 211 lb (95.7 kg)  04/26/19 213 lb 12.8 oz (97 kg)     Objective:    Vital Signs:  BP (!) 174/94 Comment: has not taken his morning meds yet  Pulse 90   Ht _0  (1.753 m)   Wt 218 lb (98.9 kg)   BMI 32.19 kg/m    VITAL SIGNS:  reviewed  ASSESSMENT & PLAN:    1.  Acute on chronic systolic heart failure: Weight is up 7 pounds from 05/27/2019.  He has bilateral feet swelling and increasing exertional dyspnea and orthopnea. He has NYHA class III symptoms.  Continue carvedilol, hydralazine, Imdur, and torsemide (increased by PCP to 80 mg daily on 06/25/2019).  I will prescribe metolazone 2.5 mg to be taken today and tomorrow.  The weight of 218 pounds is actually from 06/26/2019 as he has not weighed himself today.  He is also not taken his morning medications yet and he is hypertensive. He has a mixed nonischemic/ischemic cardiomyopathy.  Echocardiogram in December 2020 demonstrated LVEF 30 to 35% with diffuse hypokinesis.  He underwent subcutaneous ICD insertion on 05/27/2019 by Dr. Lovena Le.   2.  Chronic kidney disease stage IV: Continue torsemide 80 mg daily as prescribed by PCP.  I will add metolazone 2.5 mg today and tomorrow.  Followed by  nephrology. Creatinine 2.01 most recently.  3.  Coronary artery disease: Status post drug-eluting stent placement to the proximal right coronary artery in January 2020.  Symptomatically stable. Continue aspirin, beta-blocker, ranolazine, Imdur, Xarelto 2.5 mg twice daily (as per [facility] protocol), and statin.    4.  Hyperlipidemia: Crestor increased to 20 mg on 01/22/19. Vascepa started 2 gm bid on 01/22/19. TC 310, TG 519 on 01/22/19. I will repeat lipids.  5.  Hypertension: He is hypertensive this morning but has not taken his morning medications.  BP will need monitoring in light of increased diuretic requirement.     COVID-19 Education: The signs and symptoms of COVID-19 were discussed with the patient and how to seek care for testing (follow up with PCP or arrange E-visit).  The importance of social distancing was discussed today.  Time:   Today, I have spent 25 minutes with the patient with telehealth technology discussing the above problems.     Medication Adjustments/Labs and Tests Ordered: Current medicines are reviewed at length with the patient today.  Concerns regarding medicines are outlined above.   Tests Ordered: No orders of the defined types were placed in this encounter.   Medication Changes: No orders of the defined types were placed in this encounter.   Follow Up:  Virtual Visit 1 week  Signed, Kate Sable, MD  06/27/2019 8:36 AM    [place]

## 2019-06-27 NOTE — Patient Instructions (Addendum)
Medication Instructions:   Metolazone 2.5mg  for today and tomorrow.   Continue your Tosemide at 80mg  daily as directed by primary care physician.   Continue all other medications.    Labwork:  Lipids - order enclosed.  Reminder:  Nothing to eat or drink after 12 midnight prior to labs.  Office will contact with results via phone or letter.    Testing/Procedures: none  Follow-Up: 1 week   Any Other Special Instructions Will Be Listed Below (If Applicable).  If you need a refill on your cardiac medications before your next appointment, please call your pharmacy.

## 2019-07-05 ENCOUNTER — Telehealth (INDEPENDENT_AMBULATORY_CARE_PROVIDER_SITE_OTHER): Payer: Medicare Other | Admitting: Cardiovascular Disease

## 2019-07-05 ENCOUNTER — Encounter: Payer: Self-pay | Admitting: Cardiovascular Disease

## 2019-07-05 VITALS — BP 158/79 | HR 79 | Ht 69.0 in | Wt 213.0 lb

## 2019-07-05 DIAGNOSIS — I25118 Atherosclerotic heart disease of native coronary artery with other forms of angina pectoris: Secondary | ICD-10-CM | POA: Diagnosis not present

## 2019-07-05 DIAGNOSIS — I428 Other cardiomyopathies: Secondary | ICD-10-CM

## 2019-07-05 DIAGNOSIS — N184 Chronic kidney disease, stage 4 (severe): Secondary | ICD-10-CM

## 2019-07-05 DIAGNOSIS — I10 Essential (primary) hypertension: Secondary | ICD-10-CM

## 2019-07-05 DIAGNOSIS — I5023 Acute on chronic systolic (congestive) heart failure: Secondary | ICD-10-CM | POA: Diagnosis not present

## 2019-07-05 DIAGNOSIS — E785 Hyperlipidemia, unspecified: Secondary | ICD-10-CM

## 2019-07-05 DIAGNOSIS — Z955 Presence of coronary angioplasty implant and graft: Secondary | ICD-10-CM

## 2019-07-05 MED ORDER — METOLAZONE 2.5 MG PO TABS: ORAL_TABLET | ORAL | 0 refills | Status: DC

## 2019-07-05 NOTE — Progress Notes (Signed)
Virtual Visit via Telephone Note   This visit type was conducted due to national recommendations for restrictions regarding the COVID-19 Pandemic (e.g. social distancing) in an effort to limit this patient's exposure and mitigate transmission in our community.  Due to his co-morbid illnesses, this patient is at least at moderate risk for complications without adequate follow up.  This format is felt to be most appropriate for this patient at this time.  The patient did not have access to video technology/had technical difficulties with video requiring transitioning to audio format only (telephone).  All issues noted in this document were discussed and addressed.  No physical exam could be performed with this format.  Please refer to the patient's chart for his  consent to telehealth for [facility].   Date:  07/05/2019   ID:  Brad Mendoza, DOB [birth year], MRN [id number]  Patient Location: Home Provider Location: Home  PCP:  [name], MD  Cardiologist:  Kate Sable, MD  Electrophysiologist:  None   Evaluation Performed:  Follow-Up Visit  Chief Complaint:  CHF  History of Present Illness:    Brad Mendoza is a 64 y.o. male with chronic kidney disease stage IV, mixed ischemic/nonischemic cardiomyopathy, hypertension, diabetes mellitus, and coronary artery disease.  Cardiomyopathy dates back to 2014.  He underwent right left heart catheterization in January 2020 and underwent drug-eluting stent placement to the proximal RCA.  Echocardiogram in December 2020 demonstrated LVEF 30 to 35% with normal right ventricular size and systolic function.  He underwent subcutaneous ICD insertion on 05/27/2019 by Dr. Lovena Le.  He denies chest pain.  At his most recent visit he complained of increasing orthopnea, exertional dyspnea, and feet swelling.  He said his shortness of breath and leg swelling have improved.  He is now able to lie flat.  His weight is down 5 pounds since his last  visit with me.  He is now taking torsemide 60 mg rather than 80 mg as he had been last week.   Past Medical History:  Diagnosis Date  . Acid reflux   . Back pain    f4 and f5  . CAD (coronary artery disease)   . CHF (congestive heart failure) ([employer])    last echo was in March 2016  . Diabetes mellitus 2005  . Diverticulosis   . H/O chest pain 2005   ECHO  . HTN (hypertension)   . Hyperlipidemia   . Nonischemic cardiomyopathy ([place])   . Onychomycosis   . Polysubstance abuse [facility])    Past Surgical History:  Procedure Laterality Date  . APPENDECTOMY    . CARDIAC CATHETERIZATION  2005   4 frech cath  . COLONOSCOPY N/A 08/01/2013   Procedure: COLONOSCOPY;  Surgeon: Rogene Houston, MD;  [facility];  Service: Endoscopy;  Laterality: N/A;  rescheduled to [place] notified pt  . CORONARY STENT INTERVENTION N/A 03/19/2018   Procedure: CORONARY STENT INTERVENTION;  Surgeon: Leonie Man, MD;  Location: [place] CV LAB;  Service: Cardiovascular;  Laterality: N/A;  . ESOPHAGOGASTRODUODENOSCOPY N/A 04/23/2015   Procedure: ESOPHAGOGASTRODUODENOSCOPY (EGD);  Surgeon: Rogene Houston, MD;  [facility];  Service: Endoscopy;  Laterality: N/A;  1:25  . RIGHT/LEFT HEART CATH AND CORONARY ANGIOGRAPHY N/A 06/03/2016   Procedure: Right/Left Heart Cath and Coronary Angiography;  Surgeon: Belva Crome, MD;  Location: [place] CV LAB;  Service: Cardiovascular;  Laterality: N/A;  . RIGHT/LEFT HEART CATH AND CORONARY ANGIOGRAPHY N/A 03/19/2018   Procedure:  RIGHT/LEFT HEART CATH AND CORONARY ANGIOGRAPHY;  Surgeon: Jolaine Artist, MD;  Location: [place] CV LAB;  Service: Cardiovascular;  Laterality: N/A;  . SUBQ ICD IMPLANT N/A 05/27/2019   Procedure: SUBQ ICD IMPLANT;  Surgeon: Evans Lance, MD;  Location: [place] CV LAB;  Service: Cardiovascular;  Laterality: N/A;     Current Meds  Medication Sig  . acetaminophen-codeine (TYLENOL #3) 300-30 MG tablet Take 1  tablet by mouth every 4 (four) hours as needed for moderate pain.  [name] aspirin EC 81 MG tablet Take 81 mg by mouth daily.  . blood glucose meter kit and supplies KIT Dispense based on patient and insurance preference. Test four times daily as directed. (FOR ICD-10 : E11.9  . Blood Glucose Monitoring Suppl (ONETOUCH VERIO) w/Device KIT 1 Device by Does not apply route 2 (two) times daily.  . Capsaicin (ICY HOT ARTHRITIS THERAPY EX) Apply 1 application topically 2 (two) times daily as needed (back pain).   . carvedilol (COREG) 6.25 MG tablet Take 1 tablet (6.25 mg total) by mouth 2 (two) times daily with a meal.  . Cholecalciferol (VITAMIN D3) 25 MCG (1000 UT) CAPS Take 2,000 Units by mouth daily.  . fluticasone (FLONASE) 50 MCG/ACT nasal spray USE 2 SPRAYS IN EACH NOSTRIL DAILY  . glucose blood (ONETOUCH VERIO) test strip USE FOUR TIMES DAILY AS NEEDED Dx E11.9  . hydrALAZINE (APRESOLINE) 100 MG tablet TAKE (1) TABLET BY MOUTH (3) TIMES DAILY.  [name] icosapent Ethyl (VASCEPA) 1 g capsule Take 2 capsules (2 g total) by mouth 2 (two) times daily.  . insulin glargine, 2 Unit Dial, (TOUJEO MAX SOLOSTAR) 300 UNIT/ML Solostar Pen Inject 50 units each morning and 40 units each evening  . isosorbide mononitrate (IMDUR) 120 MG 24 hr tablet TAKE (1) TABLET BY MOUTH ONCE DAILY.  [name] loratadine (CLARITIN) 10 MG tablet Take 1 tablet (10 mg total) by mouth daily.  . Multiple Vitamins-Minerals (CENTRUM SILVER ADULT 50+ PO) Take 1 tablet by mouth daily.  . nabumetone (RELAFEN) 500 MG tablet TAKE TWO TABLETS BY MOUTH TWICE DAILY  . nitroGLYCERIN (NITROSTAT) 0.4 MG SL tablet DISSOLVE 1 TAB UNDER TOUNGE FOR CHEST PAIN. MAY REPEAT EVERY 5 MINUTES FOR 3 DOSES. IF NO RELIEF CALL 911 OR GO TO ER  . Omega-3 Fatty Acids (FISH OIL) 1000 MG CPDR Take 1,000 mg by mouth daily.   . pantoprazole (PROTONIX) 40 MG tablet TAKE ONE TABLET BY MOUTH TWICE DAILY.  [name] potassium chloride SA (KLOR-CON) 20 MEQ tablet Take 1 tablet (20 mEq total) by  mouth daily. As a potassium supplement  . pregabalin (LYRICA) 200 MG capsule Take 2 capsules (400 mg total) by mouth at bedtime.  [name] PROAIR HFA 108 (90 Base) MCG/ACT inhaler USE 2 PUFFS EVERY 6 HOURS AS NEEDED  . ranolazine (RANEXA) 500 MG 12 hr tablet TAKE 1 TABLET BY MOUTH TWICE DAILY.  . rivaroxaban (XARELTO) 2.5 MG TABS tablet Take 1 tablet (2.5 mg total) by mouth 2 (two) times daily.  . rosuvastatin (CRESTOR) 10 MG tablet Take 10 mg by mouth daily.  . Semaglutide,0.25 or 0.5MG/DOS, (OZEMPIC, 0.25 OR 0.5 MG/DOSE,) 2 MG/1.5ML SOPN Inject 0.5 mg into the skin once a week.  . terbinafine (LAMISIL) 1 % cream Apply 1 application topically 2 (two) times daily. Apply to both feet and between toes  . torsemide (DEMADEX) 20 MG tablet TAKE 3 TABLETS BY MOUTH DAILY.     Allergies:   Sulfa antibiotics and Penicillins   Social History  Tobacco Use  . Smoking status: Never Smoker  . Smokeless tobacco: Current User    Types: Snuff  . Tobacco comment: dips snuff about 6 cans/week for 3 years  Substance Use Topics  . Alcohol use: Not Currently    Alcohol/week: 1.0 standard drinks    Types: 1 Cans of beer per week    Comment: occ - has cut down, now once every 2-3 months  . Drug use: Yes    Types: Marijuana    Comment: occasional use - smoking less     Family Hx: The patient's family history includes Alcohol abuse in his brother; Cancer in his mother; Diabetes in his father; Hypertension in his father. There is no history of Colon cancer.  ROS:   Please see the history of present illness.     All other systems reviewed and are negative.   Prior CV studies:   The following studies were reviewed today:  Coronary stent intervention 03/19/2018:   Prox RCA lesion is 90% stenosed.  A drug-eluting stent was successfully placed using a STENT SYNERGY DES 2.5X16. Postdilated to 2.8 mm  Post intervention, there is a 0% residual stenosis.  Mid RCA lesion is 70% stenosed -stable from prior  cath. Will be treated medically.   SUMMARY   Successful DES PCI to proximal RCA lesion with a Synergy DES 2.5 mg 16 mm postdilated to 2.8 mm.  Echocardiogram 01/22/2019:   1. Left ventricular ejection fraction, by visual estimation, is 30 to 35%. The left ventricle has moderate to severely decreased function. Left ventricular septal wall thickness was mildly increased. Mildly increased left ventricular posterior wall  thickness. There is mildly increased left ventricular hypertrophy.  2. Elevated left ventricular end-diastolic pressure.  3. Left ventricular diastolic parameters are consistent with Grade I diastolic dysfunction (impaired relaxation).  4. The left ventricle demonstrates global hypokinesis.  5. Global right ventricle has normal systolic function.The right ventricular size is normal. No increase in right ventricular wall thickness.  6. Left atrial size was normal.  7. Right atrial size was normal.  8. The mitral valve is normal in structure. No evidence of mitral valve regurgitation. No evidence of mitral stenosis.  9. The tricuspid valve is normal in structure. Tricuspid valve regurgitation is mild. 10. The aortic valve is tricuspid. Aortic valve regurgitation is not visualized. No evidence of aortic valve sclerosis or stenosis. 11. The pulmonic valve was normal in structure. Pulmonic valve regurgitation is not visualized. 12. Moderately elevated pulmonary artery systolic pressure. 13. The inferior vena cava is normal in size with greater than 50% respiratory variability, suggesting right atrial pressure of 3 mmHg.  Labs/Other Tests and Data Reviewed:    EKG:  No ECG reviewed.  Recent Labs: 12/20/2018: B Natriuretic Peptide 105.2 04/02/2019: TSH 1.969 04/25/2019: ALT 26 05/03/2019: Hemoglobin 10.5; Platelets 285 05/24/2019: BUN 43; Creatinine, Ser 2.01; Potassium 5.1; Sodium 141   Recent Lipid Panel Lab Results  Component Value Date/Time   CHOL 310 (H) 01/22/2019 04:00 PM     CHOL 248 (H) 07/27/2018 02:43 PM   CHOL 277 (H) 07/11/2012 01:08 PM   TRIG 519 (H) 01/22/2019 04:00 PM   TRIG 658 (HH) 12/16/2013 02:59 PM   TRIG 522 (H) 07/11/2012 01:08 PM   HDL 38 (L) 01/22/2019 04:00 PM   HDL 31 (L) 07/27/2018 02:43 PM   HDL 42 12/16/2013 02:59 PM   HDL 39 (L) 07/11/2012 01:08 PM   CHOLHDL 8.2 01/22/2019 04:00 PM   LDLCALC UNABLE TO CALCULATE IF  TRIGLYCERIDE OVER 400 mg/dL 01/22/2019 04:00 PM   LDLCALC Comment 07/27/2018 02:43 PM   LDLCALC 144 (H) 05/10/2013 12:25 PM   LDLCALC 134 (H) 07/11/2012 01:08 PM   LDLDIRECT 135.2 (H) 01/22/2019 04:00 PM    Wt Readings from Last 3 Encounters:  07/05/19 213 lb (96.6 kg)  06/27/19 218 lb (98.9 kg)  05/27/19 211 lb (95.7 kg)     Objective:    Vital Signs:  BP (!) 158/79   Pulse 79   Ht 5' 9"  (1.753 m)   Wt 213 lb (96.6 kg)   BMI 31.45 kg/m    VITAL SIGNS:  reviewed  ASSESSMENT & PLAN:    1.  Acute on chronic systolic heart failure: Weight is down 5 pounds from his last visit.  He is no longer orthopneic and shortness of breath and leg swelling have improved but he does not feel back to his baseline yet.  I have instructed him to take an additional metolazone 2.5 mg today and tomorrow and in the future to take 2.5 mg for weight gain of 3 pounds in 24 hours.  He has NYHA class II symptoms.  Continue carvedilol, hydralazine, Imdur, and torsemide 60 mg. He has a mixed nonischemic/ischemic cardiomyopathy.  Echocardiogram in December 2020 demonstrated LVEF 30 to 35% with diffuse hypokinesis.  He underwent subcutaneous ICD insertion on 05/27/2019 by Dr. Lovena Le.  2.  Chronic kidney disease stage IV: Continue torsemide 60 mg daily.  I will add an additional metolazone 2.5 mg today and tomorrow.  Followed by nephrology. Creatinine 2.01 most recently.  3.  Coronary artery disease: Status post drug-eluting stent placement to the proximal right coronary artery in January 2020.  Symptomatically stable. Continue aspirin,  beta-blocker, ranolazine, Imdur, Xarelto 2.5 mg twice daily (as per [facility] protocol), and statin.    4.  Hyperlipidemia: Crestor increased to 20 mg on 01/22/19. Vascepa started 2 gm bid on 01/22/19. TC 310, TG 519 on 01/22/19. I have ordered repeat lipids and they are pending.  5.  Hypertension: BP is elevated.  BP will need monitoring in light of increased diuretic requirement.     COVID-19 Education: The signs and symptoms of COVID-19 were discussed with the patient and how to seek care for testing (follow up with PCP or arrange E-visit).  The importance of social distancing was discussed today.  Time:   Today, I have spent 20 minutes with the patient with telehealth technology discussing the above problems.     Medication Adjustments/Labs and Tests Ordered: Current medicines are reviewed at length with the patient today.  Concerns regarding medicines are outlined above.   Tests Ordered: No orders of the defined types were placed in this encounter.   Medication Changes: No orders of the defined types were placed in this encounter.   Follow Up:  Virtual Visit 1 month  Signed, Kate Sable, MD  07/05/2019 12:44 PM    [place]

## 2019-07-05 NOTE — Addendum Note (Signed)
Addended by: Barbarann Ehlers A on: 07/05/2019 12:59 PM   Modules accepted: Orders

## 2019-07-05 NOTE — Patient Instructions (Signed)
Medication Instructions: Take Metolazone 2.5 mg today and tomorrow, the Stop    Labwork: None today  Procedures/Testing: None today  Follow-Up: 4-6 weeks Virtual Visit with Dr.Koneswaran  Any Additional Special Instructions Will Be Listed Below (If Applicable).     If you need a refill on your cardiac medications before your next appointment, please call your pharmacy.        Thank you for choosing [facility] !

## 2019-07-09 ENCOUNTER — Other Ambulatory Visit: Payer: Self-pay

## 2019-07-09 ENCOUNTER — Ambulatory Visit (INDEPENDENT_AMBULATORY_CARE_PROVIDER_SITE_OTHER): Payer: Medicare Other | Admitting: Family Medicine

## 2019-07-09 ENCOUNTER — Encounter: Payer: Self-pay | Admitting: Family Medicine

## 2019-07-09 VITALS — BP 148/88 | HR 80 | Temp 97.7°F | Ht 69.0 in | Wt 214.6 lb

## 2019-07-09 DIAGNOSIS — K219 Gastro-esophageal reflux disease without esophagitis: Secondary | ICD-10-CM

## 2019-07-09 DIAGNOSIS — Z794 Long term (current) use of insulin: Secondary | ICD-10-CM

## 2019-07-09 DIAGNOSIS — I509 Heart failure, unspecified: Secondary | ICD-10-CM | POA: Diagnosis not present

## 2019-07-09 DIAGNOSIS — E1142 Type 2 diabetes mellitus with diabetic polyneuropathy: Secondary | ICD-10-CM | POA: Diagnosis not present

## 2019-07-09 NOTE — Progress Notes (Signed)
Subjective:  Patient ID: Brad Mendoza, male    DOB: May 21, 1955  Age: 64 y.o. MRN: [id number]  CC: Follow-up (2 month)   HPI [name] presents for follow-up on his heart failure.  2 weeks ago he was told to increase his fluid pills because of his swelling and shortness of breath.  Since that time he has been taking extra torsemide.  The swelling is now gone.  He also is in today for recheck of his use of Lyrica for chronic pain as well.  Pain is well controlled.  He has a diabetic neuropathy that causes numbness tingling and burning sensation in the feet and lower legs.  He is not due for his diabetes assessment today.  He did not bring any blood sugar readings.   CSA for Lyrica executed  Depression screen [facility] 2/9 07/09/2019 09/19/2018 07/27/2018  Decreased Interest 0 0 0  Down, Depressed, Hopeless 0 0 0  PHQ - 2 Score 0 0 0  Some recent data might be hidden    History [name] has a past medical history of Acid reflux, Back pain, CAD (coronary artery disease), CHF (congestive heart failure) ([place]), Diabetes mellitus (2005), Diverticulosis, H/O chest pain (2005), HTN (hypertension), Hyperlipidemia, Nonischemic cardiomyopathy ([place]), Onychomycosis, and Polysubstance abuse ([place]).   He has a past surgical history that includes Appendectomy; Colonoscopy (N/A, 08/01/2013); Cardiac catheterization (2005); Esophagogastroduodenoscopy (N/A, 04/23/2015); RIGHT/LEFT HEART CATH AND CORONARY ANGIOGRAPHY (N/A, 06/03/2016); RIGHT/LEFT HEART CATH AND CORONARY ANGIOGRAPHY (N/A, 03/19/2018); CORONARY STENT INTERVENTION (N/A, 03/19/2018); and SUBQ ICD IMPLANT (N/A, 05/27/2019).   His family history includes Alcohol abuse in his brother; Cancer in his mother; Diabetes in his father; Hypertension in his father.He reports that he has never smoked. His smokeless tobacco use includes snuff. He reports previous alcohol use of about 1.0 standard drinks of alcohol per week. He reports current drug use. Drug:  Marijuana.    ROS Review of Systems  Constitutional: Negative for fever.  Respiratory: Negative for shortness of breath.   Cardiovascular: Negative for chest pain.  Musculoskeletal: Negative for arthralgias.  Skin: Negative for rash.    Objective:  BP (!) 148/88   Pulse 80   Temp 97.7 F (36.5 C) (Temporal)   Ht 5' 9"  (1.753 m)   Wt 214 lb 9.6 oz (97.3 kg)   BMI 31.69 kg/m   BP Readings from Last 3 Encounters:  07/09/19 (!) 148/88  07/05/19 (!) 158/79  06/27/19 (!) 174/94    Wt Readings from Last 3 Encounters:  07/09/19 214 lb 9.6 oz (97.3 kg)  07/05/19 213 lb (96.6 kg)  06/27/19 218 lb (98.9 kg)     Physical Exam Vitals reviewed.  Constitutional:      Appearance: He is well-developed.  HENT:     Head: Normocephalic and atraumatic.     Right Ear: External ear normal.     Left Ear: External ear normal.     Mouth/Throat:     Pharynx: No oropharyngeal exudate or posterior oropharyngeal erythema.  Eyes:     Pupils: Pupils are equal, round, and reactive to light.  Cardiovascular:     Rate and Rhythm: Normal rate and regular rhythm.     Heart sounds: No murmur.  Pulmonary:     Effort: No respiratory distress.     Breath sounds: Normal breath sounds.  Musculoskeletal:     Cervical back: Normal range of motion and neck supple.  Neurological:     Mental Status: He is alert and oriented to person,  place, and time.       Assessment & Plan:   Bayler was seen today for follow-up.  Diagnoses and all orders for this visit:  Type 2 diabetes mellitus with diabetic polyneuropathy, with long-term current use of insulin ([place]) -     CMP14+EGFR -     ToxASSURE Select 13 (MW), Urine  Chronic congestive heart failure, unspecified heart failure type ([place]) -     CMP14+EGFR       I have discontinued [name]'s acetaminophen-codeine. I am also having him maintain his aspirin EC, Multiple Vitamins-Minerals (CENTRUM SILVER ADULT 50+ PO), Capsaicin (ICY HOT  ARTHRITIS THERAPY EX), Fish Oil, Vitamin D3, terbinafine, nabumetone, fluticasone, ProAir HFA, nitroGLYCERIN, OneTouch Verio, icosapent Ethyl, carvedilol, pregabalin, blood glucose meter kit and supplies, OneTouch Verio, loratadine, Ozempic (0.25 or 0.5 MG/DOSE), Toujeo Max SoloStar, pantoprazole, torsemide, hydrALAZINE, isosorbide mononitrate, Xarelto, ranolazine, potassium chloride SA, rosuvastatin, and metolazone.  Allergies as of 07/09/2019      Reactions   Sulfa Antibiotics Shortness Of Breath   Penicillins Rash, Other (See Comments)   Has patient had a PCN reaction causing immediate rash, facial/tongue/throat swelling, SOB or lightheadedness with hypotension: No Has patient had a PCN reaction causing severe rash involving mucus membranes or skin necrosis: No Has patient had a PCN reaction that required hospitalization No Has patient had a PCN reaction occurring within the last 10 years: No If all of the above answers are "NO", then may proceed with Cephalosporin use.      Medication List       Accurate as of Jul 09, 2019  5:19 PM. If you have any questions, ask your nurse or doctor.        STOP taking these medications   acetaminophen-codeine 300-30 MG tablet Commonly known as: TYLENOL #3 Stopped by: [name], MD     TAKE these medications   aspirin EC 81 MG tablet Take 81 mg by mouth daily.   blood glucose meter kit and supplies Kit Dispense based on patient and insurance preference. Test four times daily as directed. (FOR ICD-10 : E11.9   carvedilol 6.25 MG tablet Commonly known as: COREG Take 1 tablet (6.25 mg total) by mouth 2 (two) times daily with a meal.   CENTRUM SILVER ADULT 50+ PO Take 1 tablet by mouth daily.   Fish Oil 1000 MG Cpdr Take 1,000 mg by mouth daily.   fluticasone 50 MCG/ACT nasal spray Commonly known as: FLONASE USE 2 SPRAYS IN EACH NOSTRIL DAILY   hydrALAZINE 100 MG tablet Commonly known as: APRESOLINE TAKE (1) TABLET BY MOUTH (3)  TIMES DAILY.   icosapent Ethyl 1 g capsule Commonly known as: Vascepa Take 2 capsules (2 g total) by mouth 2 (two) times daily.   ICY HOT ARTHRITIS THERAPY EX Apply 1 application topically 2 (two) times daily as needed (back pain).   isosorbide mononitrate 120 MG 24 hr tablet Commonly known as: IMDUR TAKE (1) TABLET BY MOUTH ONCE DAILY.   loratadine 10 MG tablet Commonly known as: CLARITIN Take 1 tablet (10 mg total) by mouth daily.   metolazone 2.5 MG tablet Commonly known as: ZAROXOLYN 07/05/19 (take 2.5 mg -one tab for today & 2.5 mg -one tablet  for tomorrow only)   nabumetone 500 MG tablet Commonly known as: RELAFEN TAKE TWO TABLETS BY MOUTH TWICE DAILY   nitroGLYCERIN 0.4 MG SL tablet Commonly known as: NITROSTAT DISSOLVE 1 TAB UNDER TOUNGE FOR CHEST PAIN. MAY REPEAT EVERY 5 MINUTES FOR 3 DOSES. IF  NO RELIEF CALL 911 OR GO TO ER   OneTouch Verio test strip Generic drug: glucose blood USE FOUR TIMES DAILY AS NEEDED Dx E11.9   OneTouch Verio w/Device Kit 1 Device by Does not apply route 2 (two) times daily.   Ozempic (0.25 or 0.5 MG/DOSE) 2 MG/1.5ML Sopn Generic drug: Semaglutide(0.25 or 0.5MG/DOS) Inject 0.5 mg into the skin once a week.   pantoprazole 40 MG tablet Commonly known as: PROTONIX TAKE ONE TABLET BY MOUTH TWICE DAILY.   potassium chloride SA 20 MEQ tablet Commonly known as: KLOR-CON Take 1 tablet (20 mEq total) by mouth daily. As a potassium supplement   pregabalin 200 MG capsule Commonly known as: LYRICA Take 2 capsules (400 mg total) by mouth at bedtime.   ProAir HFA 108 (90 Base) MCG/ACT inhaler Generic drug: albuterol USE 2 PUFFS EVERY 6 HOURS AS NEEDED   ranolazine 500 MG 12 hr tablet Commonly known as: RANEXA TAKE 1 TABLET BY MOUTH TWICE DAILY.   rosuvastatin 10 MG tablet Commonly known as: CRESTOR Take 10 mg by mouth daily.   terbinafine 1 % cream Commonly known as: LAMISIL Apply 1 application topically 2 (two) times daily.  Apply to both feet and between toes   torsemide 20 MG tablet Commonly known as: DEMADEX TAKE 3 TABLETS BY MOUTH DAILY.   Toujeo Max SoloStar 300 UNIT/ML Solostar Pen Generic drug: insulin glargine (2 Unit Dial) Inject 50 units each morning and 40 units each evening   Vitamin D3 25 MCG (1000 UT) Caps Take 2,000 Units by mouth daily.   Xarelto 2.5 MG Tabs tablet Generic drug: rivaroxaban Take 1 tablet (2.5 mg total) by mouth 2 (two) times daily.        Follow-up: No follow-ups on file.  [name], M.D.

## 2019-07-10 LAB — CMP14+EGFR
ALT: 19 IU/L (ref 0–44)
AST: 20 IU/L (ref 0–40)
Albumin/Globulin Ratio: 1.6 (ref 1.2–2.2)
Albumin: 3.3 g/dL — ABNORMAL LOW (ref 3.8–4.8)
Alkaline Phosphatase: 133 IU/L — ABNORMAL HIGH (ref 48–121)
BUN/Creatinine Ratio: 18 (ref 10–24)
BUN: 46 mg/dL — ABNORMAL HIGH (ref 8–27)
Bilirubin Total: 0.2 mg/dL (ref 0.0–1.2)
CO2: 26 mmol/L (ref 20–29)
Calcium: 8.4 mg/dL — ABNORMAL LOW (ref 8.6–10.2)
Chloride: 103 mmol/L (ref 96–106)
Creatinine, Ser: 2.55 mg/dL — ABNORMAL HIGH (ref 0.76–1.27)
GFR calc Af Amer: 30 mL/min/{1.73_m2} — ABNORMAL LOW (ref >59)
GFR calc non Af Amer: 26 mL/min/{1.73_m2} — ABNORMAL LOW (ref >59)
Globulin, Total: 2.1 g/dL (ref 1.5–4.5)
Glucose: 295 mg/dL — ABNORMAL HIGH (ref 65–99)
Potassium: 4.4 mmol/L (ref 3.5–5.2)
Sodium: 140 mmol/L (ref 134–144)
Total Protein: 5.4 g/dL — ABNORMAL LOW (ref 6.0–8.5)

## 2019-07-11 DIAGNOSIS — E211 Secondary hyperparathyroidism, not elsewhere classified: Secondary | ICD-10-CM | POA: Diagnosis not present

## 2019-07-11 DIAGNOSIS — I129 Hypertensive chronic kidney disease with stage 1 through stage 4 chronic kidney disease, or unspecified chronic kidney disease: Secondary | ICD-10-CM | POA: Diagnosis not present

## 2019-07-11 DIAGNOSIS — R809 Proteinuria, unspecified: Secondary | ICD-10-CM | POA: Diagnosis not present

## 2019-07-11 DIAGNOSIS — I5022 Chronic systolic (congestive) heart failure: Secondary | ICD-10-CM | POA: Diagnosis not present

## 2019-07-11 DIAGNOSIS — N189 Chronic kidney disease, unspecified: Secondary | ICD-10-CM | POA: Diagnosis not present

## 2019-07-11 MED ORDER — PANTOPRAZOLE SODIUM 40 MG PO TBEC: 40.0000 mg | DELAYED_RELEASE_TABLET | Freq: Every day | ORAL | 5 refills | Status: AC

## 2019-07-11 NOTE — Addendum Note (Signed)
Addended by: Brynda Peon F on: 07/11/2019 11:01 AM   Modules accepted: Orders

## 2019-08-02 ENCOUNTER — Other Ambulatory Visit: Payer: Self-pay | Admitting: Internal Medicine

## 2019-08-12 ENCOUNTER — Ambulatory Visit: Payer: Medicare Other | Admitting: Family Medicine

## 2019-08-13 ENCOUNTER — Encounter: Payer: Self-pay | Admitting: Family Medicine

## 2019-08-21 ENCOUNTER — Ambulatory Visit: Payer: Medicare Other | Admitting: Family Medicine

## 2019-08-23 ENCOUNTER — Other Ambulatory Visit: Payer: Self-pay | Admitting: Family Medicine

## 2019-08-23 ENCOUNTER — Encounter: Payer: Self-pay | Admitting: Family Medicine

## 2019-08-27 ENCOUNTER — Encounter: Payer: Self-pay | Admitting: Cardiology

## 2019-08-27 ENCOUNTER — Telehealth: Payer: Medicare Other | Admitting: Cardiovascular Disease

## 2019-08-27 ENCOUNTER — Telehealth (INDEPENDENT_AMBULATORY_CARE_PROVIDER_SITE_OTHER): Payer: Medicare Other | Admitting: Cardiology

## 2019-08-27 ENCOUNTER — Ambulatory Visit (INDEPENDENT_AMBULATORY_CARE_PROVIDER_SITE_OTHER): Payer: Medicare Other | Admitting: *Deleted

## 2019-08-27 ENCOUNTER — Other Ambulatory Visit: Payer: Self-pay

## 2019-08-27 VITALS — BP 160/88 | Ht 69.0 in | Wt 204.0 lb

## 2019-08-27 DIAGNOSIS — I25119 Atherosclerotic heart disease of native coronary artery with unspecified angina pectoris: Secondary | ICD-10-CM | POA: Diagnosis not present

## 2019-08-27 DIAGNOSIS — I429 Cardiomyopathy, unspecified: Secondary | ICD-10-CM | POA: Diagnosis not present

## 2019-08-27 DIAGNOSIS — I428 Other cardiomyopathies: Secondary | ICD-10-CM | POA: Diagnosis not present

## 2019-08-27 NOTE — Progress Notes (Addendum)
Virtual Visit via Telephone Note   This visit type was conducted due to national recommendations for restrictions regarding the COVID-19 Pandemic (e.g. social distancing) in an effort to limit this patient's exposure and mitigate transmission in our community.  Due to his co-morbid illnesses, this patient is at least at moderate risk for complications without adequate follow up.  This format is felt to be most appropriate for this patient at this time.  The patient did not have access to video technology/had technical difficulties with video requiring transitioning to audio format only (telephone).  All issues noted in this document were discussed and addressed.  No physical exam could be performed with this format.  Please refer to the patient's chart for his  consent to telehealth for [facility].   The patient was identified using 2 identifiers.  Date:  08/27/2019   ID:  Brad Mendoza, DOB [birth year], MRN [id number]  Patient Location: Home Provider Location: Office  PCP:  [name], MD  Cardiologist:  Rozann Lesches, MD  Electrophysiologist:  None   Evaluation Performed:  Follow-Up Visit  Chief Complaint:   Cardiac follow-up  History of Present Illness: Brad Mendoza is a 64 y.o. male former patient of Dr. Bronson Ing last evaluated via telehealth encounter in May.  He is now establishing follow-up with me, I reviewed extensive records and updated the chart.  We spoke by phone today.  He tells me that he has been fatigued for the last few months.  His weight is actually down about 10 pounds, does not sound like he is taking metolazone any longer, but has remained on Demadex 60 mg daily.  He does not describe any angina symptoms.  I reviewed his cardiac catheterization and PCI records from 2020, also most recent echocardiogram as outlined below.  He has had follow-up in the heart failure clinic, saw Dr. Aundra Dubin back in December 2020.  He follows with Dr. Lovena Le, [facility]  Scientific ICD implanted in April of this year.  He denies any device shocks or syncope.  I reviewed his medications which are outlined below.  He remains on low-dose aspirin along with Xarelto 2.5 mg twice daily per Compass trial.  No reported bleeding problems.  Last LDL was 135 in December 2020.  He states that he has been on Crestor 20 mg daily.  He follows with Dr. 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  Creatinine was 2.55 by lab work in May.  Last hemoglobin was 10.5.  Past Medical History:  Diagnosis Date  . CAD (coronary artery disease)    DES to RCA January 2020  . Chronic back pain   . CKD (chronic kidney disease), stage IV ([place])   . Diverticulosis   . Essential hypertension   . GERD (gastroesophageal reflux disease)   . Hyperlipidemia   . ICD (implantable cardioverter-defibrillator) in place    [facility] [employer] - Dr. Lovena Le, implanted April 2021  . Onychomycosis   . Polysubstance abuse ([place])   . 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 Type 2 diabetes mellitus ([place])     Past Surgical History:  Procedure Laterality Date  . APPENDECTOMY    . CARDIAC CATHETERIZATION  2005   4 frech cath  . COLONOSCOPY N/A 08/01/2013   Procedure: COLONOSCOPY;  Surgeon: Rogene Houston, MD;  [facility];  Service: Endoscopy;  Laterality: N/A;  rescheduled to [place] notified pt  . CORONARY STENT INTERVENTION N/A 03/19/2018  Procedure: CORONARY STENT INTERVENTION;  Surgeon: Leonie Man, MD;  Location: [place] CV LAB;  Service: Cardiovascular;  Laterality: N/A;  . ESOPHAGOGASTRODUODENOSCOPY N/A 04/23/2015   Procedure: ESOPHAGOGASTRODUODENOSCOPY (EGD);  Surgeon: Rogene Houston, MD;  [facility];  Service: Endoscopy;  Laterality: N/A;  1:25  . RIGHT/LEFT HEART CATH AND CORONARY ANGIOGRAPHY N/A 06/03/2016   Procedure: Right/Left Heart Cath and Coronary Angiography;  Surgeon: Belva Crome, MD;  Location: [place] CV LAB;  Service: Cardiovascular;  Laterality: N/A;  . RIGHT/LEFT HEART CATH AND CORONARY ANGIOGRAPHY N/A 03/19/2018   Procedure: RIGHT/LEFT HEART CATH AND CORONARY ANGIOGRAPHY;  Surgeon: Jolaine Artist, MD;  Location: [place] CV LAB;  Service: Cardiovascular;  Laterality: N/A;  . SUBQ ICD IMPLANT N/A 05/27/2019   Procedure: SUBQ ICD IMPLANT;  Surgeon: Evans Lance, MD;  Location: [place] CV LAB;  Service: Cardiovascular;  Laterality: N/A;    Current Outpatient Medications  Medication Sig Dispense Refill  . aspirin EC 81 MG tablet Take 81 mg by mouth daily.    . blood glucose meter kit and supplies KIT Dispense based on patient and insurance preference. Test four times daily as directed. (FOR ICD-10 : E11.9 1 each 0  . Blood Glucose Monitoring Suppl (ONETOUCH VERIO) w/Device KIT 1 Device by Does not apply route 2 (two) times daily. 1 kit 0  . Capsaicin (ICY HOT ARTHRITIS THERAPY EX) Apply 1 application topically 2 (two) times daily as needed (back pain).     . carvedilol (COREG) 6.25 MG tablet Take 1 tablet (6.25 mg total) by mouth 2 (two) times daily with a meal. 180 tablet 1  . Cholecalciferol (VITAMIN D3) 25 MCG (1000 UT) CAPS Take 2,000 Units by mouth daily.    . fluticasone (FLONASE) 50 MCG/ACT nasal spray USE 2 SPRAYS IN EACH NOSTRIL DAILY 48 g 1  . glucose blood (ONETOUCH VERIO) test strip USE FOUR TIMES DAILY AS NEEDED Dx E11.9 100 each 11  . hydrALAZINE (APRESOLINE) 100 MG tablet TAKE (1) TABLET BY MOUTH (3) TIMES DAILY. 90 tablet 5  . icosapent Ethyl (VASCEPA) 1 g capsule Take 2 capsules (2 g total) by mouth 2 (two) times daily. 360 capsule 1  . insulin glargine, 2 Unit Dial, (TOUJEO MAX SOLOSTAR) 300 UNIT/ML Solostar Pen Inject 50 units each morning and 40 units each evening 33 mL 2  . isosorbide mononitrate (IMDUR) 120 MG 24 hr tablet TAKE (1) TABLET BY MOUTH ONCE DAILY. 30 tablet 5  . loratadine (CLARITIN) 10 MG tablet Take 1 tablet (10 mg total) by mouth  daily. 30 tablet 11  . Multiple Vitamins-Minerals (CENTRUM SILVER ADULT 50+ PO) Take 1 tablet by mouth daily.    . nabumetone (RELAFEN) 500 MG tablet TAKE TWO TABLETS BY MOUTH TWICE DAILY 360 tablet 1  . nitroGLYCERIN (NITROSTAT) 0.4 MG SL tablet DISSOLVE 1 TAB UNDER TOUNGE FOR CHEST PAIN. MAY REPEAT EVERY 5 MINUTES FOR 3 DOSES. IF NO RELIEF CALL 911 OR GO TO ER 25 tablet 2  . Omega-3 Fatty Acids (FISH OIL) 1000 MG CPDR Take 1,000 mg by mouth daily.     . pantoprazole (PROTONIX) 40 MG tablet Take 1 tablet (40 mg total) by mouth daily. 60 tablet 5  . potassium chloride SA (KLOR-CON) 20 MEQ tablet Take 1 tablet (20 mEq total) by mouth daily. As a potassium supplement 30 tablet 3  . pregabalin (LYRICA) 200 MG capsule Take 2 capsules (400 mg total) by mouth at bedtime. 60 capsule 2  .  PROAIR HFA 108 (90 Base) MCG/ACT inhaler USE 2 PUFFS EVERY 6 HOURS AS NEEDED 8.5 g 3  . ranolazine (RANEXA) 500 MG 12 hr tablet TAKE 1 TABLET BY MOUTH TWICE DAILY. 180 tablet 3  . rivaroxaban (XARELTO) 2.5 MG TABS tablet Take 1 tablet (2.5 mg total) by mouth 2 (two) times daily. 60 tablet 6  . rosuvastatin (CRESTOR) 20 MG tablet Take 20 mg by mouth daily.    . Semaglutide,0.25 or 0.5MG/DOS, (OZEMPIC, 0.25 OR 0.5 MG/DOSE,) 2 MG/1.5ML SOPN Inject 0.5 mg into the skin once a week. 1 pen 2  . terbinafine (LAMISIL) 1 % cream Apply 1 application topically 2 (two) times daily. Apply to both feet and between toes 30 g 1  . torsemide (DEMADEX) 20 MG tablet TAKE 3 TABLETS BY MOUTH DAILY. 90 tablet 5   No current facility-administered medications for this visit.   Allergies:  Sulfa antibiotics and Penicillins   Social History: The patient  reports that he has never smoked. His smokeless tobacco use includes snuff. He reports current alcohol use of about 1.0 standard drink of alcohol per week. He reports current drug use. Drug: Marijuana.   Family History: The patient's family history includes Alcohol abuse in his brother;  Diabetes in his father; Hypertension in his father; Lung cancer in his mother.   ROS:  No device shocks or syncope.  Physical Exam: VS:  BP (!) 160/88   Ht 5' 9"  (1.753 m)   Wt 204 lb (92.5 kg)   BMI 30.13 kg/m , BMI Body mass index is 30.13 kg/m.  Wt Readings from Last 3 Encounters:  08/27/19 204 lb (92.5 kg)  07/09/19 214 lb 9.6 oz (97.3 kg)  07/05/19 213 lb (96.6 kg)    Patient spoke in full sentences, not short of breath.  ECG:  An ECG dated 05/27/2019 was personally reviewed today and demonstrated:  Sinus rhythm with nonspecific T wave changes.  Recent Labwork: 12/20/2018: B Natriuretic Peptide 105.2 04/02/2019: TSH 1.969 05/03/2019: Hemoglobin 10.5; Platelets 285 07/09/2019: ALT 19; AST 20; BUN 46; Creatinine, Ser 2.55; Potassium 4.4; Sodium 140     Component Value Date/Time   CHOL 310 (H) 01/22/2019 1600   CHOL 248 (H) 07/27/2018 1443   CHOL 277 (H) 07/11/2012 1308   TRIG 519 (H) 01/22/2019 1600   TRIG 658 (HH) 12/16/2013 1459   TRIG 522 (H) 07/11/2012 1308   HDL 38 (L) 01/22/2019 1600   HDL 31 (L) 07/27/2018 1443   HDL 42 12/16/2013 1459   HDL 39 (L) 07/11/2012 1308   CHOLHDL 8.2 01/22/2019 1600   VLDL UNABLE TO CALCULATE IF TRIGLYCERIDE OVER 400 mg/dL 01/22/2019 1600   LDLCALC UNABLE TO CALCULATE IF TRIGLYCERIDE OVER 400 mg/dL 01/22/2019 1600   LDLCALC Comment 07/27/2018 1443   LDLCALC 144 (H) 05/10/2013 1225   LDLCALC 134 (H) 07/11/2012 1308   LDLDIRECT 135.2 (H) 01/22/2019 1600    Other Studies Reviewed Today:  Cardiac catheterization 03/19/2018:  Ost 1st Diag lesion is 95% stenosed.  Prox LAD to Mid LAD lesion is 60% stenosed.  2nd Diag lesion is 70% stenosed.  Mid Cx to Dist Cx lesion is 30% stenosed.  Mid RCA lesion is 70% stenosed.  Prox RCA lesion is 90% stenosed.   Findings:   Ao = 153/81 (111) LV = 144/30 RA = 11 RV = 77/12 PA = 76/30 (50) PCW = 29 Fick cardiac output/index = 6.8/3.1 PVR = 3.0 WU Ao sat = 95% PA sat = 62%,  65%  Assessment: 1. 2v CAD with high-grade lesion in proximal RCA and first diagonal 2. The RCA lesion has progressed from previous.  3. There is stable moderate CAD in LAD 4. EF 30-35% by echo with elevated filling pressures 5. Moderate mixed pulmonary HTN   PCI 03/19/2018:  Prox RCA lesion is 90% stenosed.  A drug-eluting stent was successfully placed using a STENT SYNERGY DES 2.5X16. Postdilated to 2.8 mm  Post intervention, there is a 0% residual stenosis.  Mid RCA lesion is 70% stenosed -stable from prior cath. Will be treated medically.   SUMMARY   Successful DES PCI to proximal RCA lesion with a Synergy DES 2.5 mg 16 mm postdilated to 2.8 mm.  Echocardiogram 01/22/2019: 1. Left ventricular ejection fraction, by visual estimation, is 30 to  35%. The left ventricle has moderate to severely decreased function. Left  ventricular septal wall thickness was mildly increased. Mildly increased  left ventricular posterior wall  thickness. There is mildly increased left ventricular hypertrophy.  2. Elevated left ventricular end-diastolic pressure.  3. Left ventricular diastolic parameters are consistent with Grade I  diastolic dysfunction (impaired relaxation).  4. The left ventricle demonstrates global hypokinesis.  5. Global right ventricle has normal systolic function.The right  ventricular size is normal. No increase in right ventricular wall  thickness.  6. Left atrial size was normal.  7. Right atrial size was normal.  8. The mitral valve is normal in structure. No evidence of mitral valve  regurgitation. No evidence of mitral stenosis.  9. The tricuspid valve is normal in structure. Tricuspid valve  regurgitation is mild.  10. The aortic valve is tricuspid. Aortic valve regurgitation is not  visualized. No evidence of aortic valve sclerosis or stenosis.  11. The pulmonic valve was normal in structure. Pulmonic valve  regurgitation is not visualized.  12.  Moderately elevated pulmonary artery systolic pressure.  13. The inferior vena cava is normal in size with greater than 50%  respiratory variability, suggesting right atrial pressure of 3 mmHg.   Assessment and Plan:  1.  CAD status post DES to the RCA in January 2020.  He does not describe any active angina at this time.  He is on aspirin and Xarelto 2.5 mg twice daily (based on Compass trial).  Also Imdur, Coreg, Ranexa, and Crestor.  No reported bleeding problems.  With described fatigue we will check a CBC.  2.  Secondary cardiomyopathy, mixed ischemic and nonischemic.  LVEF 30 to 35% by echocardiogram in December 2020.  He has a Chemical engineer ICD in place followed by Dr. Lovena Le, no device shocks or syncope.  Current medications include Coreg, hydralazine, Imdur, Demadex, and potassium supplements.  Potentially has room for further adjustment in therapy based on today's reported blood pressure, would like to have him seen in clinic and examined prior to making changes.  His weight is down 10 pounds from last encounter.  3.  CKD stage IV, followed by Dr. [name].  We will update BMET.  4.  Mixed hyperlipidemia, last LDL was 135 in December 2020.  He is on Crestor 20 mg daily.  Update FLP.  Medication Adjustments/Labs and Tests Ordered: Current medicines are reviewed at length with the patient today.  Concerns regarding medicines are outlined above.   Time:   Today, I have spent 9 minutes with the patient with telehealth technology discussing the above problems.    Tests Ordered: Orders Placed This Encounter  Procedures  . CBC  . Basic Metabolic Panel (BMET)  .  Lipid Profile    Medication Changes: No orders of the defined types were placed in this encounter.   Disposition:  Follow up 4 weeks in the [place] office.  Signed, Satira Sark, MD, [facility] 08/27/2019 1:37 PM    [facility] [address] [place], [place] [address] Phone:  [phone]; Fax: [phone]

## 2019-08-27 NOTE — Patient Instructions (Addendum)
Medication Instructions:  Your physician recommends that you continue on your current medications as directed. Please refer to the Current Medication list given to you today.    *If you need a refill on your cardiac medications before your next appointment, please call your pharmacy*   Lab Work: CBC and BMET, Fasting lipid profile  JUST BEFORE next visit  If you have labs (blood work) drawn today and your tests are completely normal, you will receive your results only by: [name] MyChart Message (if you have MyChart) OR . A paper copy in the mail If you have any lab test that is abnormal or we need to change your treatment, we will call you to review the results.   Testing/Procedures: None today   Follow-Up: At [facility], you and your health needs are our priority.  As part of our continuing mission to provide you with exceptional heart care, we have created designated Provider Care Teams.  These Care Teams include your primary Cardiologist (physician) and Advanced Practice Providers (APPs -  Physician Assistants and Nurse Practitioners) who all work together to provide you with the care you need, when you need it.  We recommend signing up for the patient portal called "MyChart".  Sign up information is provided on this After Visit Summary.  MyChart is used to connect with patients for Virtual Visits (Telemedicine).  Patients are able to view lab/test results, encounter notes, upcoming appointments, etc.  Non-urgent messages can be sent to your provider as well.   To learn more about what you can do with MyChart, go to [email or web address].    Your next appointment:   4 week(s)  The format for your next appointment:   In Person  Provider:   Carlyle Dolly, MD   Other Instructions None      Thank you for choosing [facility] !

## 2019-08-28 LAB — CUP PACEART REMOTE DEVICE CHECK
Battery Remaining Percentage: 98 %
Date Time Interrogation Session: 20210707121000
Implantable Lead Implant Date: 20210405
Implantable Lead Location: 753862
Implantable Lead Model: 3501
Implantable Lead Serial Number: 178864
Implantable Pulse Generator Implant Date: 20210405
Pulse Gen Serial Number: 135565

## 2019-08-29 NOTE — Progress Notes (Signed)
Remote ICD transmission.   

## 2019-08-30 ENCOUNTER — Other Ambulatory Visit: Payer: Self-pay | Admitting: Family Medicine

## 2019-08-30 ENCOUNTER — Other Ambulatory Visit: Payer: Self-pay | Admitting: *Deleted

## 2019-08-30 MED ORDER — ONETOUCH VERIO VI STRP: ORAL_STRIP | 11 refills | Status: AC

## 2019-08-30 MED ORDER — ONETOUCH ULTRASOFT LANCETS MISC: 12 refills | Status: AC

## 2019-08-30 NOTE — Telephone Encounter (Signed)
  Prescription Request  08/30/2019  What is the name of the medication or equipment? glucose blood (ONETOUCH VERIO) test strip and needles  Have you contacted your pharmacy to request a refill? (if applicable) no  Which pharmacy would you like this sent to? [facility]   Patient notified that their request is being sent to the clinical staff for review and that they should receive a response within 2 business days.

## 2019-08-30 NOTE — Telephone Encounter (Signed)
Full mailbox.  Sent scripts of Verio lancets and strips to [facility].

## 2019-09-02 ENCOUNTER — Other Ambulatory Visit: Payer: Self-pay

## 2019-09-02 ENCOUNTER — Ambulatory Visit (INDEPENDENT_AMBULATORY_CARE_PROVIDER_SITE_OTHER): Payer: Medicare Other | Admitting: Internal Medicine

## 2019-09-02 ENCOUNTER — Encounter: Payer: Self-pay | Admitting: Internal Medicine

## 2019-09-02 VITALS — BP 118/70 | HR 81 | Ht 69.0 in | Wt 212.0 lb

## 2019-09-02 DIAGNOSIS — I10 Essential (primary) hypertension: Secondary | ICD-10-CM

## 2019-09-02 DIAGNOSIS — I5042 Chronic combined systolic (congestive) and diastolic (congestive) heart failure: Secondary | ICD-10-CM | POA: Diagnosis not present

## 2019-09-02 NOTE — Patient Instructions (Signed)
Medication Instructions:  Your physician recommends that you continue on your current medications as directed. Please refer to the Current Medication list given to you today.  *If you need a refill on your cardiac medications before your next appointment, please call your pharmacy*   Lab Work: NONE   If you have labs (blood work) drawn today and your tests are completely normal, you will receive your results only by:  [place] (if you have MyChart) OR  A paper copy in the mail If you have any lab test that is abnormal or we need to change your treatment, we will call you to review the results.   Testing/Procedures: NONE    Follow-Up: At [facility], you and your health needs are our priority.  As part of our continuing mission to provide you with exceptional heart care, we have created designated Provider Care Teams.  These Care Teams include your primary Cardiologist (physician) and Advanced Practice Providers (APPs -  Physician Assistants and Nurse Practitioners) who all work together to provide you with the care you need, when you need it.  We recommend signing up for the patient portal called "MyChart".  Sign up information is provided on this After Visit Summary.  MyChart is used to connect with patients for Virtual Visits (Telemedicine).  Patients are able to view lab/test results, encounter notes, upcoming appointments, etc.  Non-urgent messages can be sent to your provider as well.   To learn more about what you can do with MyChart, go to [email or web address].    Your next appointment:   9 month(s)  The format for your next appointment:   In Person  Provider:   Cristopher Peru, MD   Other Instructions Thank you for choosing [facility]!

## 2019-09-02 NOTE — Progress Notes (Signed)
HPI Brad Mendoza returns today for followup of his chronic systolic heart failure, HTN, and s/p S-ICD insertion. The patient denies chest pain. He has class 2 dyspnea. He remains active. He denies dietary non-compliance. He has not had any ICD therapies. Allergies  Allergen Reactions  . Sulfa Antibiotics Shortness Of Breath  . Penicillins Rash and Other (See Comments)    Has patient had a PCN reaction causing immediate rash, facial/tongue/throat swelling, SOB or lightheadedness with hypotension: No Has patient had a PCN reaction causing severe rash involving mucus membranes or skin necrosis: No Has patient had a PCN reaction that required hospitalization No Has patient had a PCN reaction occurring within the last 10 years: No If all of the above answers are "NO", then may proceed with Cephalosporin use.      Current Outpatient Medications  Medication Sig Dispense Refill  . aspirin EC 81 MG tablet Take 81 mg by mouth daily.    . blood glucose meter kit and supplies KIT Dispense based on patient and insurance preference. Test four times daily as directed. (FOR ICD-10 : E11.9 1 each 0  . Blood Glucose Monitoring Suppl (ONETOUCH VERIO) w/Device KIT 1 Device by Does not apply route 2 (two) times daily. 1 kit 0  . Capsaicin (ICY HOT ARTHRITIS THERAPY EX) Apply 1 application topically 2 (two) times daily as needed (back pain).     . carvedilol (COREG) 6.25 MG tablet Take 1 tablet (6.25 mg total) by mouth 2 (two) times daily with a meal. 180 tablet 1  . Cholecalciferol (VITAMIN D3) 25 MCG (1000 UT) CAPS Take 2,000 Units by mouth daily.    . fluticasone (FLONASE) 50 MCG/ACT nasal spray USE 2 SPRAYS IN EACH NOSTRIL DAILY 48 g 1  . glucose blood (ONETOUCH VERIO) test strip USE FOUR TIMES DAILY AS NEEDED Dx E11.9 100 each 11  . hydrALAZINE (APRESOLINE) 100 MG tablet TAKE (1) TABLET BY MOUTH (3) TIMES DAILY. 90 tablet 5  . icosapent Ethyl (VASCEPA) 1 g capsule Take 2 capsules (2 g total) by  mouth 2 (two) times daily. 360 capsule 1  . insulin glargine, 2 Unit Dial, (TOUJEO MAX SOLOSTAR) 300 UNIT/ML Solostar Pen Inject 50 units each morning and 40 units each evening 33 mL 2  . isosorbide mononitrate (IMDUR) 120 MG 24 hr tablet TAKE (1) TABLET BY MOUTH ONCE DAILY. 30 tablet 5  . Lancets (ONETOUCH ULTRASOFT) lancets Use as instructed   DX E11.9 100 each 12  . loratadine (CLARITIN) 10 MG tablet Take 1 tablet (10 mg total) by mouth daily. 30 tablet 11  . Multiple Vitamins-Minerals (CENTRUM SILVER ADULT 50+ PO) Take 1 tablet by mouth daily.    . nabumetone (RELAFEN) 500 MG tablet TAKE TWO TABLETS BY MOUTH TWICE DAILY 360 tablet 1  . nitroGLYCERIN (NITROSTAT) 0.4 MG SL tablet DISSOLVE 1 TAB UNDER TOUNGE FOR CHEST PAIN. MAY REPEAT EVERY 5 MINUTES FOR 3 DOSES. IF NO RELIEF CALL 911 OR GO TO ER 25 tablet 2  . Omega-3 Fatty Acids (FISH OIL) 1000 MG CPDR Take 1,000 mg by mouth daily.     . pantoprazole (PROTONIX) 40 MG tablet Take 1 tablet (40 mg total) by mouth daily. 60 tablet 5  . potassium chloride SA (KLOR-CON) 20 MEQ tablet Take 1 tablet (20 mEq total) by mouth daily. As a potassium supplement 30 tablet 3  . pregabalin (LYRICA) 200 MG capsule Take 2 capsules (400 mg total) by mouth at bedtime. 60 capsule 2  .  PROAIR HFA 108 (90 Base) MCG/ACT inhaler USE 2 PUFFS EVERY 6 HOURS AS NEEDED 8.5 g 3  . ranolazine (RANEXA) 500 MG 12 hr tablet TAKE 1 TABLET BY MOUTH TWICE DAILY. 180 tablet 3  . rivaroxaban (XARELTO) 2.5 MG TABS tablet Take 1 tablet (2.5 mg total) by mouth 2 (two) times daily. 60 tablet 6  . rosuvastatin (CRESTOR) 20 MG tablet Take 20 mg by mouth daily.    . Semaglutide,0.25 or 0.'5MG'$ /DOS, (OZEMPIC, 0.25 OR 0.5 MG/DOSE,) 2 MG/1.5ML SOPN Inject 0.5 mg into the skin once a week. 1 pen 2  . terbinafine (LAMISIL) 1 % cream Apply 1 application topically 2 (two) times daily. Apply to both feet and between toes 30 g 1  . torsemide (DEMADEX) 20 MG tablet TAKE 3 TABLETS BY MOUTH DAILY. 90  tablet 5   No current facility-administered medications for this visit.     Past Medical History:  Diagnosis Date  . CAD (coronary artery disease)    DES to RCA January 2020  . Chronic back pain   . CKD (chronic kidney disease), stage IV ([place])   . Diverticulosis   . Essential hypertension   . GERD (gastroesophageal reflux disease)   . Hyperlipidemia   . ICD (implantable cardioverter-defibrillator) in place    [facility] - Dr. Lovena Le, implanted April 2021  . Onychomycosis   . Polysubstance abuse ([place])   . 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 Type 2 diabetes mellitus (HCC)     ROS:   All systems reviewed and negative except as noted in the HPI.   Past Surgical History:  Procedure Laterality Date  . APPENDECTOMY    . CARDIAC CATHETERIZATION  2005   4 frech cath  . COLONOSCOPY N/A 08/01/2013   Procedure: COLONOSCOPY;  Surgeon: Rogene Houston, MD;  [facility];  Service: Endoscopy;  Laterality: N/A;  rescheduled to [place] notified pt  . CORONARY STENT INTERVENTION N/A 03/19/2018   Procedure: CORONARY STENT INTERVENTION;  Surgeon: Leonie Man, MD;  Location: [place] [facility];  Service: Cardiovascular;  Laterality: N/A;  . ESOPHAGOGASTRODUODENOSCOPY N/A 04/23/2015   Procedure: ESOPHAGOGASTRODUODENOSCOPY (EGD);  Surgeon: Rogene Houston, MD;  [facility];  Service: Endoscopy;  Laterality: N/A;  1:25  . RIGHT/LEFT HEART CATH AND CORONARY ANGIOGRAPHY N/A 06/03/2016   Procedure: Right/Left Heart Cath and Coronary Angiography;  Surgeon: Belva Crome, MD;  Location: [place] [facility];  Service: Cardiovascular;  Laterality: N/A;  . RIGHT/LEFT HEART CATH AND CORONARY ANGIOGRAPHY N/A 03/19/2018   Procedure: RIGHT/LEFT HEART CATH AND CORONARY ANGIOGRAPHY;  Surgeon: Jolaine Artist, MD;  Location: [place] [facility];  Service: Cardiovascular;  Laterality: N/A;  . SUBQ ICD IMPLANT N/A 05/27/2019   Procedure: SUBQ ICD  IMPLANT;  Surgeon: Evans Lance, MD;  Location: [place] [facility];  Service: Cardiovascular;  Laterality: N/A;     Family History  Problem Relation Age of Onset  . Hypertension Father   . Diabetes Father   . Lung cancer Mother   . Alcohol abuse Brother   . Colon cancer Neg Hx      Social History   Socioeconomic History  . Marital status: Legally Separated    Spouse name: Not on file  . Number of children: 2  . Years of education: 4  . Highest education level: 4th grade  Occupational History  . Occupation: disabled  Tobacco Use  . Smoking status: Never Smoker  .  Smokeless tobacco: Current User    Types: Snuff  . Tobacco comment: dips snuff about 6 cans/week for 3 years  Vaping Use  . Vaping Use: Never used  Substance and Sexual Activity  . Alcohol use: Yes    Alcohol/week: 1.0 standard drink    Types: 1 Cans of beer per week    Comment: occ - has cut down, now once every 2-3 months  . Drug use: Yes    Types: Marijuana    Comment: occasional use - smoking less  . Sexual activity: Not on file  Other Topics Concern  . Not on file  Social History Narrative   Lives in [place] with spouse   Disabled.   Social Determinants of Health   Financial Resource Strain: Low Risk   . Difficulty of Paying Living Expenses: Not hard at all  Food Insecurity: No Food Insecurity  . Worried About Charity fundraiser in the Last Year: Never true  . Ran Out of Food in the Last Year: Never true  Transportation Needs: Unmet Transportation Needs  . Lack of Transportation (Medical): Yes  . Lack of Transportation (Non-Medical): No  Physical Activity: Inactive  . Days of Exercise per Week: 0 days  . Minutes of Exercise per Session: 0 min  Stress: No Stress Concern Present  . Feeling of Stress : Not at all  Social Connections: Unknown  . Frequency of Communication with Friends and Family: More than three times a week  . Frequency of Social Gatherings with Friends and Family: More  than three times a week  . Attends Religious Services: 1 to 4 times per year  . Active Member of Clubs or Organizations: No  . Attends Archivist Meetings: Never  . Marital Status: Patient refused  Intimate Partner Violence: Not At Risk  . Fear of Current or Ex-Partner: No  . Emotionally Abused: No  . Physically Abused: No  . Sexually Abused: No     BP 118/70   Pulse 81   Ht 5' 9" (1.753 m)   Wt 212 lb (96.2 kg)   SpO2 98%   BMI 31.31 kg/m   Physical Exam:  Well appearing NAD HEENT: Unremarkable Neck:  No JVD, no thyromegally Lymphatics:  No adenopathy Back:  No CVA tenderness Lungs:  Clear with no wheezes HEART:  Regular rate rhythm, no murmurs, no rubs, no clicks Abd:  soft, positive bowel sounds, no organomegally, no rebound, no guarding Ext:  2 plus pulses, no edema, no cyanosis, no clubbing Skin:  No rashes no nodules Neuro:  CN II through XII intact, motor grossly intact   DEVICE  Normal device function.  See PaceArt for details. No ICD therapies.  Assess/Plan: 1. Chronic systolic heart failure - his symptoms are class 2. I have asked him to reduce his salt intake. He will continue his current meds. 2. ICD - his [place] S-ICD is working normally. We will recheck in several months. 3. HTN - his bp is well controlled.  4. Dyslipidemia - he will continue his statin therapy.  Brad Mendoza.D.

## 2019-09-03 ENCOUNTER — Ambulatory Visit (INDEPENDENT_AMBULATORY_CARE_PROVIDER_SITE_OTHER): Payer: Medicare Other | Admitting: Family Medicine

## 2019-09-03 ENCOUNTER — Encounter: Payer: Self-pay | Admitting: Family Medicine

## 2019-09-03 ENCOUNTER — Telehealth: Payer: Self-pay | Admitting: *Deleted

## 2019-09-03 VITALS — BP 160/90 | HR 80 | Temp 97.6°F | Resp 20 | Ht 69.0 in | Wt 206.0 lb

## 2019-09-03 DIAGNOSIS — E11621 Type 2 diabetes mellitus with foot ulcer: Secondary | ICD-10-CM

## 2019-09-03 DIAGNOSIS — L97512 Non-pressure chronic ulcer of other part of right foot with fat layer exposed: Secondary | ICD-10-CM

## 2019-09-03 DIAGNOSIS — Z794 Long term (current) use of insulin: Secondary | ICD-10-CM | POA: Diagnosis not present

## 2019-09-03 DIAGNOSIS — I10 Essential (primary) hypertension: Secondary | ICD-10-CM

## 2019-09-03 DIAGNOSIS — E1142 Type 2 diabetes mellitus with diabetic polyneuropathy: Secondary | ICD-10-CM

## 2019-09-03 LAB — BAYER DCA HB A1C WAIVED: HB A1C (BAYER DCA - WAIVED): >14 % — ABNORMAL HIGH (ref <7.0)

## 2019-09-03 MED ORDER — TIZANIDINE HCL 4 MG PO TABS: 4.0000 mg | ORAL_TABLET | Freq: Four times a day (QID) | ORAL | 5 refills | Status: AC | PRN

## 2019-09-03 MED ORDER — TIZANIDINE HCL 6 MG PO CAPS
6.0000 mg | ORAL_CAPSULE | Freq: Three times a day (TID) | ORAL | 1 refills | Status: DC | PRN
Start: 2019-09-03 — End: 2019-09-03

## 2019-09-03 NOTE — Telephone Encounter (Signed)
His insurance doesn't cover the Tizanidine 6mg  capsulue-is it ok to switch to the 4mg  tablet?

## 2019-09-03 NOTE — Progress Notes (Signed)
Subjective:  Patient ID: Brad Mendoza, male    DOB: 11/27/1955  Age: 64 y.o. MRN: [id number]  CC: Medical Management of Chronic Issues   HPI Brad Mendoza presents for fatigue he feels tired a lot.  He denies weakness just fatigue.  He reports numbness in the left cheek and lip.  It is a pins-and-needles feeling that he gets at night.  It started a 6 or 7 months ago.  He had just had Covid at that time.  Pain patient also having pain in both sides and back.  Depression screen [facility] 2/9 09/03/2019 07/09/2019 09/19/2018  Decreased Interest 0 0 0  Down, Depressed, Hopeless 0 0 0  PHQ - 2 Score 0 0 0  Some recent data might be hidden    History Brad Mendoza has a past medical history of CAD (coronary artery disease), Chronic back pain, CKD (chronic kidney disease), stage IV ([place]), Diverticulosis, Essential hypertension, GERD (gastroesophageal reflux disease), Hyperlipidemia, ICD (implantable cardioverter-defibrillator) in place, Onychomycosis, Polysubstance abuse ([place]), Secondary cardiomyopathy ([place]), and Type 2 diabetes mellitus ([place]).   He has a past surgical history that includes Appendectomy; Colonoscopy (N/A, 08/01/2013); Cardiac catheterization (2005); Esophagogastroduodenoscopy (N/A, 04/23/2015); RIGHT/LEFT HEART CATH AND CORONARY ANGIOGRAPHY (N/A, 06/03/2016); RIGHT/LEFT HEART CATH AND CORONARY ANGIOGRAPHY (N/A, 03/19/2018); CORONARY STENT INTERVENTION (N/A, 03/19/2018); and SUBQ ICD IMPLANT (N/A, 05/27/2019).   His family history includes Alcohol abuse in his brother; Diabetes in his father; Hypertension in his father; Lung cancer in his mother.He reports that he has never smoked. His smokeless tobacco use includes snuff. He reports current alcohol use of about 1.0 standard drink of alcohol per week. He reports current drug use. Drug: Marijuana.    ROS Review of Systems  Constitutional: Negative for fever.  Respiratory: Negative for shortness of breath.   Cardiovascular: Negative for chest pain.   Musculoskeletal: Negative for arthralgias.  Skin: Negative for rash.    Objective:  BP (!) 160/90   Pulse 80   Temp 97.6 F (36.4 C) (Temporal)   Resp 20   Ht 5' 9"  (1.753 m)   Wt 206 lb (93.4 kg)   SpO2 96%   BMI 30.42 kg/m   BP Readings from Last 3 Encounters:  09/03/19 (!) 160/90  09/02/19 118/70  08/27/19 (!) 160/88    Wt Readings from Last 3 Encounters:  09/03/19 206 lb (93.4 kg)  09/02/19 212 lb (96.2 kg)  08/27/19 204 lb (92.5 kg)     Physical Exam Vitals reviewed.  Constitutional:      Appearance: He is well-developed.  HENT:     Head: Normocephalic and atraumatic.     Right Ear: Tympanic membrane and external ear normal. No decreased hearing noted.     Left Ear: Tympanic membrane and external ear normal. No decreased hearing noted.     Mouth/Throat:     Pharynx: No oropharyngeal exudate or posterior oropharyngeal erythema.  Eyes:     Pupils: Pupils are equal, round, and reactive to light.  Cardiovascular:     Rate and Rhythm: Normal rate and regular rhythm.     Heart sounds: No murmur heard.   Pulmonary:     Effort: No respiratory distress.     Breath sounds: Normal breath sounds.  Abdominal:     General: Bowel sounds are normal.     Palpations: Abdomen is soft. There is no mass.     Tenderness: There is no abdominal tenderness.  Musculoskeletal:     Cervical back: Normal range of motion and  neck supple.  Skin:    General: Skin is warm and dry.     Findings: Lesion (1.5 cm ulcer at the tip of the big toe.  Grade 2.) present.         Assessment & Plan:   Brad Mendoza was seen today for medical management of chronic issues.  Diagnoses and all orders for this visit:  Type 2 diabetes mellitus with diabetic polyneuropathy, with long-term current use of insulin ([place]) -     Bayer DCA Hb A1c Waived -     CBC with Differential/Platelet -     CMP14+EGFR -     Lipid panel  Essential hypertension -     CBC with Differential/Platelet -      CMP14+EGFR -     Lipid panel  Other orders -     Discontinue: tizanidine (ZANAFLEX) 6 MG capsule; Take 1 capsule (6 mg total) by mouth 3 (three) times daily as needed for muscle spasms. -     tiZANidine (ZANAFLEX) 4 MG tablet; Take 1 tablet (4 mg total) by mouth every 6 (six) hours as needed for muscle spasms.       I have discontinued Cheyenne L. Sinquefield's tizanidine. I am also having him start on tiZANidine. Additionally, I am having him maintain his aspirin EC, Multiple Vitamins-Minerals (CENTRUM SILVER ADULT 50+ PO), Capsaicin (ICY HOT ARTHRITIS THERAPY EX), Fish Oil, Vitamin D3, terbinafine, nabumetone, fluticasone, ProAir HFA, nitroGLYCERIN, icosapent Ethyl, carvedilol, pregabalin, blood glucose meter kit and supplies, OneTouch Verio, loratadine, Ozempic (0.25 or 0.5 MG/DOSE), Toujeo Max SoloStar, torsemide, hydrALAZINE, isosorbide mononitrate, Xarelto, ranolazine, potassium chloride SA, pantoprazole, rosuvastatin, OneTouch Verio, and onetouch ultrasoft.  Allergies as of 09/03/2019      Reactions   Sulfa Antibiotics Shortness Of Breath   Penicillins Rash, Other (See Comments)   Has patient had a PCN reaction causing immediate rash, facial/tongue/throat swelling, SOB or lightheadedness with hypotension: No Has patient had a PCN reaction causing severe rash involving mucus membranes or skin necrosis: No Has patient had a PCN reaction that required hospitalization No Has patient had a PCN reaction occurring within the last 10 years: No If all of the above answers are "NO", then may proceed with Cephalosporin use.      Medication List       Accurate as of September 03, 2019 11:59 PM. If you have any questions, ask your nurse or doctor.        aspirin EC 81 MG tablet Take 81 mg by mouth daily.   blood glucose meter kit and supplies Kit Dispense based on patient and insurance preference. Test four times daily as directed. (FOR ICD-10 : E11.9   carvedilol 6.25 MG tablet Commonly known  as: COREG Take 1 tablet (6.25 mg total) by mouth 2 (two) times daily with a meal.   CENTRUM SILVER ADULT 50+ PO Take 1 tablet by mouth daily.   Fish Oil 1000 MG Cpdr Take 1,000 mg by mouth daily.   fluticasone 50 MCG/ACT nasal spray Commonly known as: FLONASE USE 2 SPRAYS IN EACH NOSTRIL DAILY   hydrALAZINE 100 MG tablet Commonly known as: APRESOLINE TAKE (1) TABLET BY MOUTH (3) TIMES DAILY.   icosapent Ethyl 1 g capsule Commonly known as: Vascepa Take 2 capsules (2 g total) by mouth 2 (two) times daily.   ICY HOT ARTHRITIS THERAPY EX Apply 1 application topically 2 (two) times daily as needed (back pain).   isosorbide mononitrate 120 MG 24 hr tablet Commonly known as: IMDUR TAKE (1)  TABLET BY MOUTH ONCE DAILY.   loratadine 10 MG tablet Commonly known as: CLARITIN Take 1 tablet (10 mg total) by mouth daily.   nabumetone 500 MG tablet Commonly known as: RELAFEN TAKE TWO TABLETS BY MOUTH TWICE DAILY   nitroGLYCERIN 0.4 MG SL tablet Commonly known as: NITROSTAT DISSOLVE 1 TAB UNDER TOUNGE FOR CHEST PAIN. MAY REPEAT EVERY 5 MINUTES FOR 3 DOSES. IF NO RELIEF CALL 911 OR GO TO ER   onetouch ultrasoft lancets Use as instructed   DX E11.9   OneTouch Verio test strip Generic drug: glucose blood USE FOUR TIMES DAILY AS NEEDED Dx E11.9   OneTouch Verio w/Device Kit 1 Device by Does not apply route 2 (two) times daily.   Ozempic (0.25 or 0.5 MG/DOSE) 2 MG/1.5ML Sopn Generic drug: Semaglutide(0.25 or 0.5MG/DOS) Inject 0.5 mg into the skin once a week.   pantoprazole 40 MG tablet Commonly known as: PROTONIX Take 1 tablet (40 mg total) by mouth daily.   potassium chloride SA 20 MEQ tablet Commonly known as: KLOR-CON Take 1 tablet (20 mEq total) by mouth daily. As a potassium supplement   pregabalin 200 MG capsule Commonly known as: LYRICA Take 2 capsules (400 mg total) by mouth at bedtime.   ProAir HFA 108 (90 Base) MCG/ACT inhaler Generic drug: albuterol USE 2  PUFFS EVERY 6 HOURS AS NEEDED   ranolazine 500 MG 12 hr tablet Commonly known as: RANEXA TAKE 1 TABLET BY MOUTH TWICE DAILY.   rosuvastatin 20 MG tablet Commonly known as: CRESTOR Take 20 mg by mouth daily.   terbinafine 1 % cream Commonly known as: LAMISIL Apply 1 application topically 2 (two) times daily. Apply to both feet and between toes   tiZANidine 4 MG tablet Commonly known as: ZANAFLEX Take 1 tablet (4 mg total) by mouth every 6 (six) hours as needed for muscle spasms. Started by: [name], MD   torsemide 20 MG tablet Commonly known as: DEMADEX TAKE 3 TABLETS BY MOUTH DAILY.   Toujeo Max SoloStar 300 UNIT/ML Solostar Pen Generic drug: insulin glargine (2 Unit Dial) Inject 50 units each morning and 40 units each evening   Vitamin D3 25 MCG (1000 UT) Caps Take 2,000 Units by mouth daily.   Xarelto 2.5 MG Tabs tablet Generic drug: rivaroxaban Take 1 tablet (2.5 mg total) by mouth 2 (two) times daily.        Follow-up: No follow-ups on file.  [name], M.D.

## 2019-09-03 NOTE — Telephone Encounter (Signed)
Please let the patient know that I sent their prescription to their pharmacy. Thanks, WS 

## 2019-09-03 NOTE — Telephone Encounter (Signed)
Pt aware of the medication switch.

## 2019-09-04 ENCOUNTER — Encounter: Payer: Self-pay | Admitting: Family Medicine

## 2019-09-04 LAB — CBC WITH DIFFERENTIAL/PLATELET
Basophils Absolute: 0.1 10*3/uL (ref 0.0–0.2)
Basos: 1 %
EOS (ABSOLUTE): 0.2 10*3/uL (ref 0.0–0.4)
Eos: 3 %
Hematocrit: 33.4 % — ABNORMAL LOW (ref 37.5–51.0)
Hemoglobin: 11 g/dL — ABNORMAL LOW (ref 13.0–17.7)
Immature Grans (Abs): 0 10*3/uL (ref 0.0–0.1)
Immature Granulocytes: 0 %
Lymphocytes Absolute: 1.3 10*3/uL (ref 0.7–3.1)
Lymphs: 19 %
MCH: 26.8 pg (ref 26.6–33.0)
MCHC: 32.9 g/dL (ref 31.5–35.7)
MCV: 81 fL (ref 79–97)
Monocytes Absolute: 0.6 10*3/uL (ref 0.1–0.9)
Monocytes: 9 %
Neutrophils Absolute: 4.6 10*3/uL (ref 1.4–7.0)
Neutrophils: 68 %
Platelets: 247 10*3/uL (ref 150–450)
RBC: 4.11 x10E6/uL — ABNORMAL LOW (ref 4.14–5.80)
RDW: 14.1 % (ref 11.6–15.4)
WBC: 6.7 10*3/uL (ref 3.4–10.8)

## 2019-09-04 LAB — LIPID PANEL
Chol/HDL Ratio: 4.7 ratio (ref 0.0–5.0)
Cholesterol, Total: 188 mg/dL (ref 100–199)
HDL: 40 mg/dL (ref >39)
LDL Chol Calc (NIH): 119 mg/dL — ABNORMAL HIGH (ref 0–99)
Triglycerides: 165 mg/dL — ABNORMAL HIGH (ref 0–149)
VLDL Cholesterol Cal: 29 mg/dL (ref 5–40)

## 2019-09-04 LAB — CMP14+EGFR
ALT: 21 IU/L (ref 0–44)
AST: 22 IU/L (ref 0–40)
Albumin/Globulin Ratio: 1.4 (ref 1.2–2.2)
Albumin: 3.2 g/dL — ABNORMAL LOW (ref 3.8–4.8)
Alkaline Phosphatase: 138 IU/L — ABNORMAL HIGH (ref 48–121)
BUN/Creatinine Ratio: 16 (ref 10–24)
BUN: 41 mg/dL — ABNORMAL HIGH (ref 8–27)
Bilirubin Total: <0.2 mg/dL (ref 0.0–1.2)
CO2: 25 mmol/L (ref 20–29)
Calcium: 8.9 mg/dL (ref 8.6–10.2)
Chloride: 103 mmol/L (ref 96–106)
Creatinine, Ser: 2.49 mg/dL — ABNORMAL HIGH (ref 0.76–1.27)
GFR calc Af Amer: 31 mL/min/{1.73_m2} — ABNORMAL LOW (ref >59)
GFR calc non Af Amer: 26 mL/min/{1.73_m2} — ABNORMAL LOW (ref >59)
Globulin, Total: 2.3 g/dL (ref 1.5–4.5)
Glucose: 198 mg/dL — ABNORMAL HIGH (ref 65–99)
Potassium: 4.9 mmol/L (ref 3.5–5.2)
Sodium: 138 mmol/L (ref 134–144)
Total Protein: 5.5 g/dL — ABNORMAL LOW (ref 6.0–8.5)

## 2019-09-06 ENCOUNTER — Ambulatory Visit: Payer: Medicare Other | Admitting: Pharmacist

## 2019-09-06 ENCOUNTER — Telehealth (INDEPENDENT_AMBULATORY_CARE_PROVIDER_SITE_OTHER): Payer: Self-pay

## 2019-09-09 ENCOUNTER — Other Ambulatory Visit: Payer: Self-pay

## 2019-09-09 ENCOUNTER — Ambulatory Visit (INDEPENDENT_AMBULATORY_CARE_PROVIDER_SITE_OTHER): Payer: Medicare Other | Admitting: Pharmacist

## 2019-09-09 DIAGNOSIS — Z794 Long term (current) use of insulin: Secondary | ICD-10-CM | POA: Diagnosis not present

## 2019-09-09 DIAGNOSIS — E1142 Type 2 diabetes mellitus with diabetic polyneuropathy: Secondary | ICD-10-CM

## 2019-09-09 NOTE — Progress Notes (Signed)
    09/09/2019 Name: Brad Mendoza MRN: [id number] DOB: 1955/10/18   S:  24 yoM presents for diabetes evaluation, education, and management Patient was referred and last seen by Primary Care Provider on 09/03/19.  Insurance coverage/medication affordability: [employer] medicare  Patient reports adherence with medications. . Current diabetes medications include: toujeo, ozempic . Current hypertension medications include: coreg, isosorbide, hydralazine Goal 130/80 . Current hyperlipidemia medications include: rosuvastatin   Patient denies hypoglycemic events.   Patient reported dietary habits: Eats 2-3 meals/day Discussed meal planning options and Plate method for healthy eating . Avoid sugary drinks and desserts . Incorporate balanced protein, non starchy veggies, 1 serving of carbohydrate with each meal . Increase water intake . Increase physical activity as able  . Breakfast:jimmy dean bagels with cheese  . Lunch:skips  . Dinner:pinto beans, [place] necks  . Snacks: enjoys deserts  . Drinks:gatorade G2, or zero sugar  Patient-reported exercise habits: walks, but having leg pain   O:  Lab Results  Component Value Date   HGBA1C >14.0 (H) 09/03/2019   Lipid Panel     Component Value Date/Time   CHOL 188 09/03/2019 1252   CHOL 277 (H) 07/11/2012 1308   TRIG 165 (H) 09/03/2019 1252   TRIG 658 (HH) 12/16/2013 1459   TRIG 522 (H) 07/11/2012 1308   HDL 40 09/03/2019 1252   HDL 42 12/16/2013 1459   HDL 39 (L) 07/11/2012 1308   CHOLHDL 4.7 09/03/2019 1252   CHOLHDL 8.2 01/22/2019 1600   VLDL UNABLE TO CALCULATE IF TRIGLYCERIDE OVER 400 mg/dL 01/22/2019 1600   LDLCALC 119 (H) 09/03/2019 1252   LDLCALC 144 (H) 05/10/2013 1225   LDLCALC 134 (H) 07/11/2012 1308   LDLDIRECT 135.2 (H) 01/22/2019 1600    Home fasting blood sugars: .181 today FBG 88, 235, 58, 158  2 hour post-meal/random blood sugars: 180, 165, 258, 143.   A/P:  Diabetes T2DM3 currently uncontrolled, A1c  >14%.  Patient is adherent with medication. Control is suboptimal due to diet.  -Continue Toujeo (basal insulin) --50 units in the AM   -Started GLP-1 Ozempic (generic name: semglutide)  . Instructions: Inject 0.5mg  weekly thereafter (as tolerated) -- on mondays . Increase to 1mg  Ozempic--Switch in 1 month pending patient tolerance  -Will add on SGLT2 at next visit (Farxiga) --GFR 31, however Wilder Glade is indicated down to GFR 25  -Extensively discussed pathophysiology of diabetes, recommended lifestyle interventions, dietary effects on blood sugar control  -Counseled on s/sx of and management of hypoglycemia  -Next A1C anticipated 3 months.   Written patient instructions provided.  Total time in face to face counseling 30 minutes.   Follow up PCP Clinic Visit ON 09/16/19.    Regina Eck, PharmD, BCPS Clinical Pharmacist, [place]  II Phone [phone]

## 2019-09-10 ENCOUNTER — Encounter (INDEPENDENT_AMBULATORY_CARE_PROVIDER_SITE_OTHER): Payer: Medicare Other | Admitting: Ophthalmology

## 2019-09-11 DIAGNOSIS — R809 Proteinuria, unspecified: Secondary | ICD-10-CM | POA: Diagnosis not present

## 2019-09-11 DIAGNOSIS — I129 Hypertensive chronic kidney disease with stage 1 through stage 4 chronic kidney disease, or unspecified chronic kidney disease: Secondary | ICD-10-CM | POA: Diagnosis not present

## 2019-09-11 DIAGNOSIS — N189 Chronic kidney disease, unspecified: Secondary | ICD-10-CM | POA: Diagnosis not present

## 2019-09-11 DIAGNOSIS — E211 Secondary hyperparathyroidism, not elsewhere classified: Secondary | ICD-10-CM | POA: Diagnosis not present

## 2019-09-11 DIAGNOSIS — I5042 Chronic combined systolic (congestive) and diastolic (congestive) heart failure: Secondary | ICD-10-CM | POA: Diagnosis not present

## 2019-09-11 NOTE — Progress Notes (Signed)
[facility] Note  09/12/2019     CHIEF COMPLAINT Patient presents for Retina Follow Up   HISTORY OF PRESENT ILLNESS: Brad Mendoza is a 64 y.o. male who presents to the clinic today for:   HPI    Retina Follow Up    Patient presents with  Diabetic Retinopathy.  In both eyes.  Duration of 8 months.  Since onset it is gradually worsening.  I, the attending physician,  performed the HPI with the patient and updated documentation appropriately.          Comments    8 month follow up NPDR OU (should have been 4 weeks at last appt 12/2018)-  Decreased vision "I can't see far off" OU.  Pt states he is scared to drive.  He missed his appts because he had COVID and "almost died"  In 02/27/2019 his eyelid, mouth, and side of face left has went numb states pt. BS 181 this am A1C 11.7       Last edited by [name], MD on 09/12/2019 12:15 PM. (History)    pt states he is delayed to f/u because he got covid back in 02-27-23, he states he was hospitalized for 4 days, he says the left side of his face is still numb  Referring physician: [name], MD [place],  [place] [address]  HISTORICAL INFORMATION:   Selected notes from the MEDICAL RECORD NUMBER Referred by Dr. [name] for concern of CSME OS;  LEE- 04.04.19 ([name]) [BCVA OD: 20/20 OS: 20/40] Ocular Hx-  PMH- DM, CAD, CHF, HTN, hyperlipidemia    CURRENT MEDICATIONS: No current outpatient medications on file. (Ophthalmic Drugs)   No current facility-administered medications for this visit. (Ophthalmic Drugs)   Current Outpatient Medications (Other)  Medication Sig  . aspirin EC 81 MG tablet Take 81 mg by mouth daily.  . blood glucose meter kit and supplies KIT Dispense based on patient and insurance preference. Test four times daily as directed. (FOR ICD-10 : E11.9  . Blood Glucose Monitoring Suppl (ONETOUCH VERIO) w/Device KIT 1 Device by Does not apply route 2 (two) times daily.  .  Capsaicin (ICY HOT ARTHRITIS THERAPY EX) Apply 1 application topically 2 (two) times daily as needed (back pain).   . carvedilol (COREG) 6.25 MG tablet Take 1 tablet (6.25 mg total) by mouth 2 (two) times daily with a meal.  . Cholecalciferol (VITAMIN D3) 25 MCG (1000 UT) CAPS Take 2,000 Units by mouth daily.  . fluticasone (FLONASE) 50 MCG/ACT nasal spray USE 2 SPRAYS IN EACH NOSTRIL DAILY  . glucose blood (ONETOUCH VERIO) test strip USE FOUR TIMES DAILY AS NEEDED Dx E11.9  . hydrALAZINE (APRESOLINE) 100 MG tablet TAKE (1) TABLET BY MOUTH (3) TIMES DAILY.  [name] icosapent Ethyl (VASCEPA) 1 g capsule Take 2 capsules (2 g total) by mouth 2 (two) times daily.  . insulin glargine, 2 Unit Dial, (TOUJEO MAX SOLOSTAR) 300 UNIT/ML Solostar Pen Inject 50 units each morning and 40 units each evening  . isosorbide mononitrate (IMDUR) 120 MG 24 hr tablet TAKE (1) TABLET BY MOUTH ONCE DAILY.  [name] Lancets (ONETOUCH ULTRASOFT) lancets Use as instructed   DX E11.9  . loratadine (CLARITIN) 10 MG tablet Take 1 tablet (10 mg total) by mouth daily.  . Multiple Vitamins-Minerals (CENTRUM SILVER ADULT 50+ PO) Take 1 tablet by mouth daily.  . nabumetone (RELAFEN) 500 MG tablet TAKE TWO TABLETS BY MOUTH TWICE DAILY  .  nitroGLYCERIN (NITROSTAT) 0.4 MG SL tablet DISSOLVE 1 TAB UNDER TOUNGE FOR CHEST PAIN. MAY REPEAT EVERY 5 MINUTES FOR 3 DOSES. IF NO RELIEF CALL 911 OR GO TO ER  . Omega-3 Fatty Acids (FISH OIL) 1000 MG CPDR Take 1,000 mg by mouth daily.   . pantoprazole (PROTONIX) 40 MG tablet Take 1 tablet (40 mg total) by mouth daily.  . potassium chloride SA (KLOR-CON) 20 MEQ tablet Take 1 tablet (20 mEq total) by mouth daily. As a potassium supplement  . pregabalin (LYRICA) 200 MG capsule Take 2 capsules (400 mg total) by mouth at bedtime.  [name] PROAIR HFA 108 (90 Base) MCG/ACT inhaler USE 2 PUFFS EVERY 6 HOURS AS NEEDED  . ranolazine (RANEXA) 500 MG 12 hr tablet TAKE 1 TABLET BY MOUTH TWICE DAILY.  . rivaroxaban (XARELTO)  2.5 MG TABS tablet Take 1 tablet (2.5 mg total) by mouth 2 (two) times daily.  . rosuvastatin (CRESTOR) 20 MG tablet Take 20 mg by mouth daily.  . Semaglutide,0.25 or 0.5MG/DOS, (OZEMPIC, 0.25 OR 0.5 MG/DOSE,) 2 MG/1.5ML SOPN Inject 0.5 mg into the skin once a week. (Patient not taking: Reported on 09/03/2019)  . terbinafine (LAMISIL) 1 % cream Apply 1 application topically 2 (two) times daily. Apply to both feet and between toes  . tiZANidine (ZANAFLEX) 4 MG tablet Take 1 tablet (4 mg total) by mouth every 6 (six) hours as needed for muscle spasms.  [name] torsemide (DEMADEX) 20 MG tablet TAKE 3 TABLETS BY MOUTH DAILY.   No current facility-administered medications for this visit. (Other)      REVIEW OF SYSTEMS: ROS    Positive for: Gastrointestinal, Endocrine, Cardiovascular, Eyes, Allergic/Imm   Negative for: Constitutional, Neurological, Skin, Genitourinary, Musculoskeletal, HENT, Respiratory, Psychiatric, Heme/Lymph   Last edited by Leonie Douglas, COA on 09/12/2019  9:26 AM. (History)       ALLERGIES Allergies  Allergen Reactions  . Sulfa Antibiotics Shortness Of Breath  . Penicillins Rash and Other (See Comments)    Has patient had a PCN reaction causing immediate rash, facial/tongue/throat swelling, SOB or lightheadedness with hypotension: No Has patient had a PCN reaction causing severe rash involving mucus membranes or skin necrosis: No Has patient had a PCN reaction that required hospitalization No Has patient had a PCN reaction occurring within the last 10 years: No If all of the above answers are "NO", then may proceed with Cephalosporin use.     PAST MEDICAL HISTORY Past Medical History:  Diagnosis Date  . CAD (coronary artery disease)    DES to RCA January 2020  . Chronic back pain   . CKD (chronic kidney disease), stage IV ([place])   . Diverticulosis   . Essential hypertension   . GERD (gastroesophageal reflux disease)   . Hyperlipidemia   . ICD (implantable  cardioverter-defibrillator) in place    [facility] - Dr. Lovena Le, implanted April 2021  . Onychomycosis   . Polysubstance abuse ([place])   . 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 Type 2 diabetes mellitus ([place])    Past Surgical History:  Procedure Laterality Date  . APPENDECTOMY    . CARDIAC CATHETERIZATION  2005   4 frech cath  . COLONOSCOPY N/A 08/01/2013   Procedure: COLONOSCOPY;  Surgeon: Rogene Houston, MD;  [facility];  Service: Endoscopy;  Laterality: N/A;  rescheduled to [place] notified pt  . CORONARY STENT INTERVENTION N/A 03/19/2018   Procedure: CORONARY STENT INTERVENTION;  Surgeon: Leonie Man,  MD;  Location: [facility];  Service: Cardiovascular;  Laterality: N/A;  . ESOPHAGOGASTRODUODENOSCOPY N/A 04/23/2015   Procedure: ESOPHAGOGASTRODUODENOSCOPY (EGD);  Surgeon: Rogene Houston, MD;  [facility];  Service: Endoscopy;  Laterality: N/A;  1:25  . RIGHT/LEFT HEART CATH AND CORONARY ANGIOGRAPHY N/A 06/03/2016   Procedure: Right/Left Heart Cath and Coronary Angiography;  Surgeon: Belva Crome, MD;  Location: [facility];  Service: Cardiovascular;  Laterality: N/A;  . RIGHT/LEFT HEART CATH AND CORONARY ANGIOGRAPHY N/A 03/19/2018   Procedure: RIGHT/LEFT HEART CATH AND CORONARY ANGIOGRAPHY;  Surgeon: Jolaine Artist, MD;  Location: [facility];  Service: Cardiovascular;  Laterality: N/A;  . SUBQ ICD IMPLANT N/A 05/27/2019   Procedure: SUBQ ICD IMPLANT;  Surgeon: Evans Lance, MD;  Location: [facility];  Service: Cardiovascular;  Laterality: N/A;    FAMILY HISTORY Family History  Problem Relation Age of Onset  . Hypertension Father   . Diabetes Father   . Lung cancer Mother   . Alcohol abuse Brother   . Colon cancer Neg Hx     SOCIAL HISTORY Social History   Tobacco Use  . Smoking status: Never Smoker  . Smokeless tobacco: Current User    Types: Snuff  . Tobacco comment: dips  snuff about 6 cans/week for 3 years  Vaping Use  . Vaping Use: Never used  Substance Use Topics  . Alcohol use: Yes    Alcohol/week: 1.0 standard drink    Types: 1 Cans of beer per week    Comment: occ - has cut down, now once every 2-3 months  . Drug use: Yes    Types: Marijuana    Comment: occasional use - smoking less         OPHTHALMIC EXAM:  Base Eye Exam    Visual Acuity (Snellen - Linear)      Right Left   Dist cc 20/40 -2 20/70 +2   Dist ph cc NI 20/60   Correction: Glasses       Tonometry (Tonopen, 9:38 AM)      Right Left   Pressure 14 14       Pupils      Dark Light Shape React APD   Right 4 3 Round Brisk None   Left 4 3 Round Brisk None       Visual [place] (Counting fingers)      Left Right    Full Full       Extraocular Movement      Right Left    Full Full       Neuro/Psych    Oriented x3: Yes   Mood/Affect: Normal       Dilation    Both eyes: 1.0% Mydriacyl, 2.5% Phenylephrine @ 9:38 AM        Slit Lamp and Fundus Exam    Slit Lamp Exam      Right Left   Lids/Lashes Dermatochalasis - upper lid Dermatochalasis - upper lid   Conjunctiva/Sclera Melanosis Melanosis   Cornea Arcus, trace Punctate epithelial erosions Arcus, trace Punctate epithelial erosions, round k scar inferonasal para-central   Anterior Chamber Deep and quiet Deep and quiet   Iris Round and dilated, No NVI Round and dilated, No NVI   Lens 2-3+ Nuclear sclerosis, 2-3+ Cortical cataract, Vacuoles 2+ Nuclear sclerosis, 2+ Cortical cataract, Vacuoles   Vitreous Vitreous syneresis, prominent vitreous base nasally , vitreous condensations? Silicone oil superiorly Vitreous syneresis, prominent vitreous base nasally  Fundus Exam      Right Left   Disc mild Pallor, Sharp rim 1-2+Pallor, Sharp rim   C/D Ratio 0.4 0.4   Macula Flat, blunted foveal reflex, central cystic changes - slightly increased Blunted foveal reflex, scattered MA, +cystic changes/edema, CWS along ST  arcades   Vessels Mild Vascular attenuation, Tortuous Mild Vascular attenuation, Tortuous   Periphery Attached, scattered MAs, scattered White without pressure, scattered midzonal drusen, scattered DBH Attached, scattered IRH/DBH, scattered White without pressure, mild midzonal drusen        Refraction    Wearing Rx      Sphere Cylinder Axis Add   Right -0.25 +1.00 019 +2.50   Left -0.25 +1.00 153 +2.50       Manifest Refraction      Sphere Cylinder Axis Dist VA   Right -0.25 +1.25 010 20/40+1   Left -0.25 +1.00 153 NI          IMAGING AND PROCEDURES  Imaging and Procedures for 05/31/17  OCT, Retina - OU - Both Eyes       Right Eye Quality was good. Central Foveal Thickness: 315. Progression has worsened. Findings include no SRF, intraretinal fluid, abnormal foveal contour, vitreomacular adhesion  (Interval increase in IRF).   Left Eye Quality was good. Central Foveal Thickness: 248. Progression has worsened. Findings include intraretinal fluid, no SRF, abnormal foveal contour (Interval increase in temporal IRF/DME; interval improvement in vitreous opacities).   Notes *Images captured and stored on drive  Diagnosis / Impression:  DME OU, OS>OD OD: Interval increase in IRF/DME  OS: Interval increase in temporal IRF/DME; interval improvement in vitreous opacities  Clinical management:  See below  Abbreviations: NFP - Normal foveal profile. CME - cystoid macular edema. PED - pigment epithelial detachment. IRF - intraretinal fluid. SRF - subretinal fluid. EZ - ellipsoid zone. ERM - epiretinal membrane. ORA - outer retinal atrophy. ORT - outer retinal tubulation. SRHM - subretinal hyper-reflective material         Intravitreal Injection, Pharmacologic Agent - OD - Right Eye       Time Out 09/12/2019. 11:48 AM. Confirmed correct patient, procedure, site, and patient consented.   Anesthesia Topical anesthesia was used.   Procedure A supplied needle was used.    Injection:  1.25 mg Bevacizumab (AVASTIN) SOLN   NDC: 44010-272-53, Lot: [id number] , Expiration date: 10/16/2019   Route: Intravitreal, Site: Right Eye, Waste: 0 mg  Post-op Post injection exam found visual acuity of at least counting fingers. The patient tolerated the procedure well. There were no complications. The patient received written and verbal post procedure care education.        Intravitreal Injection, Pharmacologic Agent - OS - Left Eye       Time Out 09/12/2019. 11:48 AM. Confirmed correct patient, procedure, site, and patient consented.   Anesthesia Topical anesthesia was used. Anesthetic medications included Proparacaine 0.5%, Lidocaine 2%.   Procedure Preparation included 5% betadine to ocular surface, eyelid speculum. A (32g) needle was used.   Injection:  2 mg aflibercept 09/25/2019) SOLN   NDC: Alfonse Flavors, Lot: [id number], Expiration date: 11/22/2019   Route: Intravitreal, Site: Left Eye, Waste: 0.05 mL  Post-op Post injection exam found visual acuity of at least counting fingers. The patient tolerated the procedure well. There were no complications. The patient received written and verbal post procedure care education.                 ASSESSMENT/PLAN:    ICD-10-CM   1. Moderate  nonproliferative diabetic retinopathy of both eyes with macular edema associated with type 2 diabetes mellitus (HCC)  G31.5176 Intravitreal Injection, Pharmacologic Agent - OD - Right Eye    Intravitreal Injection, Pharmacologic Agent - OS - Left Eye    aflibercept (EYLEA) SOLN 2 mg    Bevacizumab (AVASTIN) SOLN 1.25 mg  2. Retinal edema  H35.81 OCT, Retina - OU - Both Eyes  3. Essential hypertension  I10   4. Hypertensive retinopathy of both eyes  H35.033   5. Combined forms of age-related cataract of both eyes  H25.813     1,2. Moderate to severe non-proliferative diabetic retinopathy, OU  - delayed f/u from 4 wks to 8 mos due to contracting COVID in Dec 2020  -  initial exam showed scattered IRH, exudates, and cotton wool spots, OS > OD, no NV  - FA 4.10.19 shows no NV OU  - S/P IVA OS #1 (05.29.19), #2 (06.26.19), #3 (07.29.19)  - S/P IVE OS #1 (08.30.19), #2 (10.01.19), #3 (10.31.19), #4 (03.02.20), #5 (04.07.20), #6 (07.01.20), #7 (11.12.20)  - S/P IVA OD #1 (04.07.20), #2 (07.01.20), #3 (11.12.20)  - last A1c >14% on 7.13.21 -- pt reports poor control due to poor diet  - exam shows interval increase in DME / cystic changes OU  - OCT shows interval increase in diabetic macular edema OU  - BCVA 20/40 OD (stable), 20/60 OS (dec from 20/40)  - recommend IVA #4 OD and IVE #8 OS today, 07.22.21  - pt wishes to proceed  - RBA of procedure discussed, questions answered  - informed consent obtained and signed  - see procedure note  - Eylea4U paper work and benefits investigation started on 07.29.19 -- Approved for Good Days for 2021  - f/u in 4 weeks -- DFE; OCT; possible injection  3,4. Hypertensive retinopathy OU  - discussed importance of tight BP control  - monitor  5. Combined form age-related cataract OU-   - The symptoms of cataract, surgical options, and treatments and risks were discussed with patient.  - discussed diagnosis and progression  - not yet visually significant  - monitor for now   Ophthalmic Meds Ordered this visit:  Meds ordered this encounter  Medications  . aflibercept (EYLEA) SOLN 2 mg  . Bevacizumab (AVASTIN) SOLN 1.25 mg       Return in about 4 weeks (around 10/10/2019) for f/u DME OU -- Dilated Exam, OCT, Possible Injxn.  There are no Patient Instructions on file for this visit.   Explained the diagnoses, plan, and follow up with the patient and they expressed understanding.  Patient expressed understanding of the importance of proper follow up care.   This document serves as a record of services personally performed by Gardiner Sleeper, MD, PhD. It was created on their behalf by Leonie Douglas, an ophthalmic  technician. The creation of this record is the provider's dictation and/or activities during the visit.    Electronically signed by: Leonie Douglas COA, 09/12/19  12:21 PM   Gardiner Sleeper, M.D., Ph.D. Diseases & Surgery of the Retina and Vitreous [facility]  I have reviewed the above documentation for accuracy and completeness, and I agree with the above. Gardiner Sleeper, M.D., Ph.D. 09/12/19 12:21 PM   Abbreviations: M myopia (nearsighted); A astigmatism; H hyperopia (farsighted); P presbyopia; Mrx spectacle prescription;  CTL contact lenses; OD right eye; OS left eye; OU both eyes  XT exotropia; ET esotropia; PEK punctate epithelial keratitis; PEE  punctate epithelial erosions; DES dry eye syndrome; MGD meibomian gland dysfunction; ATs artificial tears; PFAT's preservative free artificial tears; [place] nuclear sclerotic cataract; PSC posterior subcapsular cataract; ERM epi-retinal membrane; PVD posterior vitreous detachment; RD retinal detachment; DM diabetes mellitus; DR diabetic retinopathy; NPDR non-proliferative diabetic retinopathy; PDR proliferative diabetic retinopathy; CSME clinically significant macular edema; DME diabetic macular edema; dbh dot blot hemorrhages; CWS cotton wool spot; POAG primary open angle glaucoma; C/D cup-to-disc ratio; HVF humphrey visual field; GVF goldmann visual field; OCT optical coherence tomography; IOP intraocular pressure; BRVO Branch retinal vein occlusion; CRVO central retinal vein occlusion; CRAO central retinal artery occlusion; BRAO branch retinal artery occlusion; RT retinal tear; SB scleral buckle; PPV pars plana vitrectomy; VH Vitreous hemorrhage; PRP panretinal laser photocoagulation; IVK intravitreal kenalog; VMT vitreomacular traction; MH Macular hole;  NVD neovascularization of the disc; NVE neovascularization elsewhere; AREDS age related eye disease study; ARMD age related macular degeneration; POAG primary open angle glaucoma;  EBMD epithelial/anterior basement membrane dystrophy; ACIOL anterior chamber intraocular lens; IOL intraocular lens; PCIOL posterior chamber intraocular lens; Phaco/IOL phacoemulsification with intraocular lens placement; [employer] photorefractive keratectomy; LASIK laser assisted in situ keratomileusis; HTN hypertension; DM diabetes mellitus; COPD chronic obstructive pulmonary disease

## 2019-09-12 ENCOUNTER — Ambulatory Visit (INDEPENDENT_AMBULATORY_CARE_PROVIDER_SITE_OTHER): Payer: Medicare Other | Admitting: Ophthalmology

## 2019-09-12 ENCOUNTER — Other Ambulatory Visit: Payer: Self-pay

## 2019-09-12 ENCOUNTER — Other Ambulatory Visit: Payer: Self-pay | Admitting: Family Medicine

## 2019-09-12 ENCOUNTER — Encounter (INDEPENDENT_AMBULATORY_CARE_PROVIDER_SITE_OTHER): Payer: Self-pay | Admitting: Ophthalmology

## 2019-09-12 ENCOUNTER — Telehealth: Payer: Self-pay | Admitting: Pharmacist

## 2019-09-12 DIAGNOSIS — I10 Essential (primary) hypertension: Secondary | ICD-10-CM | POA: Diagnosis not present

## 2019-09-12 DIAGNOSIS — R0681 Apnea, not elsewhere classified: Secondary | ICD-10-CM

## 2019-09-12 DIAGNOSIS — H25813 Combined forms of age-related cataract, bilateral: Secondary | ICD-10-CM | POA: Diagnosis not present

## 2019-09-12 DIAGNOSIS — H35033 Hypertensive retinopathy, bilateral: Secondary | ICD-10-CM

## 2019-09-12 DIAGNOSIS — H3581 Retinal edema: Secondary | ICD-10-CM

## 2019-09-12 DIAGNOSIS — E113313 Type 2 diabetes mellitus with moderate nonproliferative diabetic retinopathy with macular edema, bilateral: Secondary | ICD-10-CM

## 2019-09-12 DIAGNOSIS — R6889 Other general symptoms and signs: Secondary | ICD-10-CM | POA: Diagnosis not present

## 2019-09-12 MED ORDER — AFLIBERCEPT 2MG/0.05ML IZ SOLN FOR KALEIDOSCOPE
2.0000 mg | INTRAVITREAL | Status: AC | PRN
  Administered 2019-09-12: 2 mg via INTRAVITREAL

## 2019-09-12 MED ORDER — BEVACIZUMAB CHEMO INJECTION 1.25MG/0.05ML SYRINGE FOR KALEIDOSCOPE
1.2500 mg | INTRAVITREAL | Status: AC | PRN
  Administered 2019-09-12: 1.25 mg via INTRAVITREAL

## 2019-09-12 NOTE — Telephone Encounter (Signed)
I entered a referral. Thanks for the info.

## 2019-09-12 NOTE — Telephone Encounter (Signed)
Patient is s/p COVID this year Requesting sleep study/CPAP Reports gasping for air at night  Thank you!

## 2019-09-16 ENCOUNTER — Ambulatory Visit (INDEPENDENT_AMBULATORY_CARE_PROVIDER_SITE_OTHER): Payer: Medicare Other | Admitting: Family Medicine

## 2019-09-16 ENCOUNTER — Encounter: Payer: Self-pay | Admitting: Family Medicine

## 2019-09-16 ENCOUNTER — Other Ambulatory Visit: Payer: Self-pay

## 2019-09-16 VITALS — BP 130/85 | HR 88 | Temp 97.8°F | Ht 69.0 in | Wt 206.2 lb

## 2019-09-16 DIAGNOSIS — L97511 Non-pressure chronic ulcer of other part of right foot limited to breakdown of skin: Secondary | ICD-10-CM | POA: Diagnosis not present

## 2019-09-16 DIAGNOSIS — E11621 Type 2 diabetes mellitus with foot ulcer: Secondary | ICD-10-CM | POA: Diagnosis not present

## 2019-09-16 DIAGNOSIS — N184 Chronic kidney disease, stage 4 (severe): Secondary | ICD-10-CM | POA: Diagnosis not present

## 2019-09-16 NOTE — Progress Notes (Signed)
Chief Complaint  Patient presents with  . Wound Check    HPI  Patient presents today for recheck of his diabetic ulcer of the tip of the right toe.  He has been doing dressings on it daily.  He is left open to air this morning.  His energy has improved a lot.  His blood glucose was reviewed and showed his average to be about 138.  Yesterday he had a high up to about 250.  It looks like a couple of lows were recorded on his libre at about 50.  He denies any symptoms.  He has started taking Ozempic.  Our pharmacist Lottie Dawson has been helping with these modalities he is also having a hard time getting around and he needs a cane.  He is also requesting a handicap sticker.  PMH: Smoking status noted ROS: Per HPI  Objective: BP (!) 130/85   Pulse 88   Temp 97.8 F (36.6 C) (Temporal)   Ht 5\' 9"  (1.753 m)   Wt (!) 206 lb 3.2 oz (93.5 kg)   BMI 30.45 kg/m  Gen: NAD, alert, cooperative with exam HEENT: NCAT, EOMI, PERRL Ext: No edema, warm.  The diabetic ulcer has some skin forming over the bed.  There is still some necrotic matter around the margins. Neuro: Alert and oriented, No gross deficits  Assessment and plan:  1. Diabetic ulcer of toe of right foot associated with type 2 diabetes mellitus, limited to breakdown of skin ([place])   2. Chronic kidney disease, stage IV (severe) (HCC)     Continue meds as is.  He will need follow-up with Lottie Dawson to assess his response to Ozempic.  He should follow closely with his renal specialist as well.  Wound care as is.  Follow-up should it not continue to healing in 1 month.    Follow up as needed.  [name], MD

## 2019-09-17 ENCOUNTER — Telehealth: Payer: Self-pay | Admitting: Family Medicine

## 2019-09-17 NOTE — Telephone Encounter (Signed)
Pt called office to check status of referral for sinus issues. Says he has already spoken to Dr Livia Snellen about it and Dr Livia Snellen said he would send referral for it.

## 2019-09-18 ENCOUNTER — Other Ambulatory Visit: Payer: Self-pay | Admitting: Family Medicine

## 2019-09-18 DIAGNOSIS — J302 Other seasonal allergic rhinitis: Secondary | ICD-10-CM

## 2019-09-18 DIAGNOSIS — J329 Chronic sinusitis, unspecified: Secondary | ICD-10-CM

## 2019-09-18 NOTE — Telephone Encounter (Signed)
Patient aware and verbalized understanding. °

## 2019-09-18 NOTE — Telephone Encounter (Signed)
Pt would like a ENT referral for sinus issues.  I see referral for sleep apnea, but he says thay say he can't have another because it hasn't been 3 years since the last.   He is needing cpap/supplies.

## 2019-09-18 NOTE — Telephone Encounter (Signed)
Referral ordered

## 2019-09-23 ENCOUNTER — Telehealth: Payer: Self-pay | Admitting: Family Medicine

## 2019-09-23 MED ORDER — FREESTYLE LIBRE 2 SENSOR MISC: 10 refills | Status: AC

## 2019-09-24 NOTE — Telephone Encounter (Signed)
Free trial called in to [place] for Freestyle libre2 (local and instock) #14 day supply Will attempt to get $75 voucher program started for patient Patient verbalizes understanding

## 2019-09-27 ENCOUNTER — Other Ambulatory Visit: Payer: Self-pay | Admitting: Cardiology

## 2019-10-03 ENCOUNTER — Telehealth: Payer: Self-pay | Admitting: Family Medicine

## 2019-10-03 NOTE — Telephone Encounter (Signed)
appt with pharmd scheduled for 10/04/19

## 2019-10-04 ENCOUNTER — Other Ambulatory Visit: Payer: Self-pay

## 2019-10-04 ENCOUNTER — Ambulatory Visit (INDEPENDENT_AMBULATORY_CARE_PROVIDER_SITE_OTHER): Payer: Medicare Other | Admitting: Pharmacist

## 2019-10-04 DIAGNOSIS — E119 Type 2 diabetes mellitus without complications: Secondary | ICD-10-CM

## 2019-10-08 ENCOUNTER — Ambulatory Visit (INDEPENDENT_AMBULATORY_CARE_PROVIDER_SITE_OTHER): Payer: Medicare Other | Admitting: Cardiology

## 2019-10-08 ENCOUNTER — Other Ambulatory Visit: Payer: Self-pay

## 2019-10-08 ENCOUNTER — Encounter: Payer: Self-pay | Admitting: Cardiology

## 2019-10-08 VITALS — BP 132/76 | HR 82 | Ht 69.0 in | Wt 202.0 lb

## 2019-10-08 DIAGNOSIS — N1831 Chronic kidney disease, stage 3a: Secondary | ICD-10-CM

## 2019-10-08 DIAGNOSIS — I25119 Atherosclerotic heart disease of native coronary artery with unspecified angina pectoris: Secondary | ICD-10-CM

## 2019-10-08 DIAGNOSIS — I429 Cardiomyopathy, unspecified: Secondary | ICD-10-CM

## 2019-10-08 DIAGNOSIS — I5042 Chronic combined systolic (congestive) and diastolic (congestive) heart failure: Secondary | ICD-10-CM

## 2019-10-08 NOTE — Patient Instructions (Signed)
Medication Instructions:  Your physician recommends that you continue on your current medications as directed. Please refer to the Current Medication list given to you today.  *If you need a refill on your cardiac medications before your next appointment, please call your pharmacy*   Lab Work: None today If you have labs (blood work) drawn today and your tests are completely normal, you will receive your results only by: . MyChart Message (if you have MyChart) OR . A paper copy in the mail If you have any lab test that is abnormal or we need to change your treatment, we will call you to review the results.   Testing/Procedures: None today   Follow-Up: At [facility], you and your health needs are our priority.  As part of our continuing mission to provide you with exceptional heart care, we have created designated Provider Care Teams.  These Care Teams include your primary Cardiologist (physician) and Advanced Practice Providers (APPs -  Physician Assistants and Nurse Practitioners) who all work together to provide you with the care you need, when you need it.  We recommend signing up for the patient portal called "MyChart".  Sign up information is provided on this After Visit Summary.  MyChart is used to connect with patients for Virtual Visits (Telemedicine).  Patients are able to view lab/test results, encounter notes, upcoming appointments, etc.  Non-urgent messages can be sent to your provider as well.   To learn more about what you can do with MyChart, go to [email or web address].    Your next appointment:   3 month(s)  The format for your next appointment:   In Person  Provider:   Samuel McDowell, MD   Other Instructions None      Thank you for choosing [facility] !         

## 2019-10-08 NOTE — Progress Notes (Signed)
Cardiology Office Note  Date: 10/08/2019   ID: Brad Mendoza, DOB 03/14/1955, MRN [id number]  PCP:  [name], MD  Cardiologist:  Rozann Lesches, MD Electrophysiologist:  None   Chief Complaint  Patient presents with  . Cardiac follow-up    History of Present Illness: Brad Mendoza is a 64 y.o. male last assessed via telehealth encounter in July.  He is here for routine visit.  States that he is doing well.  He reports NYHA class II dyspnea.  Walks once or twice a day for about 30 minutes.  He has not had any angina symptoms or nitroglycerin use.  No palpitations or syncope.  He follows with Dr. Lovena Le, [place] ICD in place.  He had a recent office visit, I reviewed the note.  No device discharges.  I reviewed his interval lab work as outlined below.  Weight is down a few more pounds since last encounter.  His cardiac regimen is stable as outlined below.  Past Medical History:  Diagnosis Date  . CAD (coronary artery disease)    DES to RCA January 2020  . Chronic back pain   . CKD (chronic kidney disease), stage IV ([place])   . Diverticulosis   . Essential hypertension   . GERD (gastroesophageal reflux disease)   . Hyperlipidemia   . ICD (implantable cardioverter-defibrillator) in place    [facility] - Dr. Lovena Le, implanted April 2021  . Onychomycosis   . Polysubstance abuse ([place])   . 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 Type 2 diabetes mellitus ([place])     Past Surgical History:  Procedure Laterality Date  . APPENDECTOMY    . CARDIAC CATHETERIZATION  2005   4 frech cath  . COLONOSCOPY N/A 08/01/2013   Procedure: COLONOSCOPY;  Surgeon: Rogene Houston, MD;  [facility];  Service: Endoscopy;  Laterality: N/A;  rescheduled to [place] notified pt  . CORONARY STENT INTERVENTION N/A 03/19/2018   Procedure: CORONARY STENT INTERVENTION;  Surgeon: Leonie Man, MD;  Location: [place] [facility];  Service:  Cardiovascular;  Laterality: N/A;  . ESOPHAGOGASTRODUODENOSCOPY N/A 04/23/2015   Procedure: ESOPHAGOGASTRODUODENOSCOPY (EGD);  Surgeon: Rogene Houston, MD;  [facility];  Service: Endoscopy;  Laterality: N/A;  1:25  . RIGHT/LEFT HEART CATH AND CORONARY ANGIOGRAPHY N/A 06/03/2016   Procedure: Right/Left Heart Cath and Coronary Angiography;  Surgeon: Belva Crome, MD;  Location: [place] [facility];  Service: Cardiovascular;  Laterality: N/A;  . RIGHT/LEFT HEART CATH AND CORONARY ANGIOGRAPHY N/A 03/19/2018   Procedure: RIGHT/LEFT HEART CATH AND CORONARY ANGIOGRAPHY;  Surgeon: Jolaine Artist, MD;  Location: [place] [facility];  Service: Cardiovascular;  Laterality: N/A;  . SUBQ ICD IMPLANT N/A 05/27/2019   Procedure: SUBQ ICD IMPLANT;  Surgeon: Evans Lance, MD;  Location: [place] [facility];  Service: Cardiovascular;  Laterality: N/A;    Current Outpatient Medications  Medication Sig Dispense Refill  . aspirin EC 81 MG tablet Take 81 mg by mouth daily.    . blood glucose meter kit and supplies KIT Dispense based on patient and insurance preference. Test four times daily as directed. (FOR ICD-10 : E11.9 1 each 0  . Blood Glucose Monitoring Suppl (ONETOUCH VERIO) w/Device KIT 1 Device by Does not apply route 2 (two) times daily. 1 kit 0  . Capsaicin (ICY HOT ARTHRITIS THERAPY EX) Apply 1 application topically 2 (two) times daily as needed (back pain).     [name]  carvedilol (COREG) 6.25 MG tablet Take 1 tablet (6.25 mg total) by mouth 2 (two) times daily with a meal. 180 tablet 1  . Cholecalciferol (VITAMIN D3) 25 MCG (1000 UT) CAPS Take 2,000 Units by mouth daily.    . Continuous Blood Gluc Sensor (FREESTYLE LIBRE 2 SENSOR) MISC Use to test blood sugar up to 6 times daily as directed. E11.9 1 each 10  . fluticasone (FLONASE) 50 MCG/ACT nasal spray USE 2 SPRAYS IN EACH NOSTRIL DAILY 48 g 1  . glucose blood (ONETOUCH VERIO) test strip USE FOUR TIMES DAILY AS NEEDED Dx E11.9 100 each 11  .  hydrALAZINE (APRESOLINE) 100 MG tablet TAKE (1) TABLET BY MOUTH (3) TIMES DAILY. 90 tablet 5  . icosapent Ethyl (VASCEPA) 1 g capsule Take 2 capsules (2 g total) by mouth 2 (two) times daily. 360 capsule 1  . insulin glargine, 2 Unit Dial, (TOUJEO MAX SOLOSTAR) 300 UNIT/ML Solostar Pen Inject 50 units each morning and 40 units each evening 33 mL 2  . isosorbide mononitrate (IMDUR) 120 MG 24 hr tablet TAKE (1) TABLET BY MOUTH ONCE DAILY. 30 tablet 5  . Lancets (ONETOUCH ULTRASOFT) lancets Use as instructed   DX E11.9 100 each 12  . loratadine (CLARITIN) 10 MG tablet Take 1 tablet (10 mg total) by mouth daily. 30 tablet 11  . Multiple Vitamins-Minerals (CENTRUM SILVER ADULT 50+ PO) Take 1 tablet by mouth daily.    . nabumetone (RELAFEN) 500 MG tablet TAKE TWO TABLETS BY MOUTH TWICE DAILY 360 tablet 1  . nitroGLYCERIN (NITROSTAT) 0.4 MG SL tablet DISSOLVE 1 TAB UNDER TOUNGE FOR CHEST PAIN. MAY REPEAT EVERY 5 MINUTES FOR 3 DOSES. IF NO RELIEF CALL 911 OR GO TO ER 25 tablet 2  . Omega-3 Fatty Acids (FISH OIL) 1000 MG CPDR Take 1,000 mg by mouth daily.     . pantoprazole (PROTONIX) 40 MG tablet Take 1 tablet (40 mg total) by mouth daily. 60 tablet 5  . potassium chloride SA (KLOR-CON) 20 MEQ tablet Take 1 tablet (20 mEq total) by mouth daily. As a potassium supplement 30 tablet 3  . pregabalin (LYRICA) 200 MG capsule Take 2 capsules (400 mg total) by mouth at bedtime. 60 capsule 2  . PROAIR HFA 108 (90 Base) MCG/ACT inhaler USE 2 PUFFS EVERY 6 HOURS AS NEEDED 8.5 g 3  . ranolazine (RANEXA) 500 MG 12 hr tablet TAKE 1 TABLET BY MOUTH TWICE DAILY. 180 tablet 3  . rosuvastatin (CRESTOR) 20 MG tablet Take 20 mg by mouth daily.    . Semaglutide,0.25 or 0.5MG/DOS, (OZEMPIC, 0.25 OR 0.5 MG/DOSE,) 2 MG/1.5ML SOPN Inject 0.5 mg into the skin once a week. 1 pen 2  . terbinafine (LAMISIL) 1 % cream Apply 1 application topically 2 (two) times daily. Apply to both feet and between toes 30 g 1  . tiZANidine (ZANAFLEX)  4 MG tablet Take 1 tablet (4 mg total) by mouth every 6 (six) hours as needed for muscle spasms. 60 tablet 5  . torsemide (DEMADEX) 20 MG tablet TAKE 3 TABLETS BY MOUTH DAILY. 90 tablet 5  . XARELTO 2.5 MG TABS tablet TAKE (1) TABLET BY MOUTH TWICE DAILY. 60 tablet 11   No current facility-administered medications for this visit.   Allergies:  Sulfa antibiotics and Penicillins   ROS: No orthopnea or PND, no leg swelling.  Physical Exam: VS:  BP 132/76   Pulse 82   Ht 5' 9" (1.753 m)   Wt 202 lb (91.6 kg)  SpO2 96%   BMI 29.83 kg/m , BMI Body mass index is 29.83 kg/m.  Wt Readings from Last 3 Encounters:  10/08/19 202 lb (91.6 kg)  09/16/19 (!) 206 lb 3.2 oz (93.5 kg)  09/03/19 206 lb (93.4 kg)    General: Patient appears comfortable at rest. HEENT: Conjunctiva and lids normal, wearing a mask. Neck: Supple, no elevated JVP or carotid bruits, no thyromegaly. Lungs: Clear to auscultation, nonlabored breathing at rest. Cardiac: Regular rate and rhythm, no S3, soft systolic murmur, no pericardial rub. Extremities: No pitting edema, distal pulses 2+.  ECG:  An ECG dated 05/27/2019 was personally reviewed today and demonstrated:  Sinus rhythm with nonspecific T wave changes.  Recent Labwork: 12/20/2018: B Natriuretic Peptide 105.2 04/02/2019: TSH 1.969 09/03/2019: ALT 21; AST 22; BUN 41; Creatinine, Ser 2.49; Hemoglobin 11.0; Platelets 247; Potassium 4.9; Sodium 138     Component Value Date/Time   CHOL 188 09/03/2019 1252   CHOL 277 (H) 07/11/2012 1308   TRIG 165 (H) 09/03/2019 1252   TRIG 658 (HH) 12/16/2013 1459   TRIG 522 (H) 07/11/2012 1308   HDL 40 09/03/2019 1252   HDL 42 12/16/2013 1459   HDL 39 (L) 07/11/2012 1308   CHOLHDL 4.7 09/03/2019 1252   CHOLHDL 8.2 01/22/2019 1600   VLDL UNABLE TO CALCULATE IF TRIGLYCERIDE OVER 400 mg/dL 01/22/2019 1600   LDLCALC 119 (H) 09/03/2019 1252   LDLCALC 144 (H) 05/10/2013 1225   LDLCALC 134 (H) 07/11/2012 1308   LDLDIRECT 135.2  (H) 01/22/2019 1600    Other Studies Reviewed Today:  Cardiac catheterization 03/19/2018:  Ost 1st Diag lesion is 95% stenosed.  Prox LAD to Mid LAD lesion is 60% stenosed.  2nd Diag lesion is 70% stenosed.  Mid Cx to Dist Cx lesion is 30% stenosed.  Mid RCA lesion is 70% stenosed.  Prox RCA lesion is 90% stenosed.  Findings:  Ao = 153/81 (111) LV = 144/30 RA = 11 RV = 77/12 PA = 76/30 (50) PCW = 29 Fick cardiac output/index = 6.8/3.1 PVR = 3.0 WU Ao sat = 95% PA sat = 62%, 65%  Assessment: 1. 2v CAD with high-grade lesion in proximal RCA and first diagonal 2. The RCA lesion has progressed from previous.  3. There is stable moderate CAD in LAD 4. EF 30-35% by echo with elevated filling pressures 5. Moderate mixed pulmonary HTN   PCI 03/19/2018:  Prox RCA lesion is 90% stenosed.  A drug-eluting stent was successfully placed using a STENT SYNERGY DES 2.5X16. Postdilated to 2.8 mm  Post intervention, there is a 0% residual stenosis.  Mid RCA lesion is 70% stenosed -stable from prior cath. Will be treated medically.  SUMMARY   Successful DES PCI to proximal RCA lesion with a Synergy DES 2.5 mg 16 mm postdilated to 2.8 mm.  Echocardiogram 01/22/2019: 1. Left ventricular ejection fraction, by visual estimation, is 30 to  35%. The left ventricle has moderate to severely decreased function. Left  ventricular septal wall thickness was mildly increased. Mildly increased  left ventricular posterior wall  thickness. There is mildly increased left ventricular hypertrophy.  2. Elevated left ventricular end-diastolic pressure.  3. Left ventricular diastolic parameters are consistent with Grade I  diastolic dysfunction (impaired relaxation).  4. The left ventricle demonstrates global hypokinesis.  5. Global right ventricle has normal systolic function.The right  ventricular size is normal. No increase in right ventricular wall  thickness.  6. Left atrial size  was normal.  7. Right atrial size  was normal.  8. The mitral valve is normal in structure. No evidence of mitral valve  regurgitation. No evidence of mitral stenosis.  9. The tricuspid valve is normal in structure. Tricuspid valve  regurgitation is mild.  10. The aortic valve is tricuspid. Aortic valve regurgitation is not  visualized. No evidence of aortic valve sclerosis or stenosis.  11. The pulmonic valve was normal in structure. Pulmonic valve  regurgitation is not visualized.  12. Moderately elevated pulmonary artery systolic pressure.  13. The inferior vena cava is normal in size with greater than 50%  respiratory variability, suggesting right atrial pressure of 3 mmHg.   Assessment and Plan:  1.  Chronic systolic heart failure with mixed cardiomyopathy, LVEF 30 to 35% by last assessment.  He is clinically stable, weight is down a few pounds, reports NYHA class II dyspnea with regular walking plan for exercise.  No changes made to present regimen.  Continue Coreg, hydralazine, Imdur, Ranexa, and Demadex.  2.  CAD status post DES to the proximal RCA in January 2020.  No active angina or nitroglycerin use.  Continue aspirin, Coreg, Ranexa, Imdur, and Xarelto 2.5 mg twice daily ([facility] protocol).  He is also on Crestor.  3.  Mixed hyperlipidemia, tolerating Crestor.  LDL coming down, most recently 119.  Continue to follow.  4.  CKD stage IV.  He continues to follow with nephrology.  Recent creatinine 2.49.  Medication Adjustments/Labs and Tests Ordered: Current medicines are reviewed at length with the patient today.  Concerns regarding medicines are outlined above.   Tests Ordered: No orders of the defined types were placed in this encounter.   Medication Changes: No orders of the defined types were placed in this encounter.   Disposition:  Follow up 3 months in the [place] office.  Signed, Satira Sark, MD, [facility] 10/08/2019 2:34 PM    [name] at [facility] [address], [place],  [address] Phone: [phone]; Fax: [phone]

## 2019-10-12 ENCOUNTER — Telehealth: Payer: Self-pay | Admitting: Cardiology

## 2019-10-12 DIAGNOSIS — E785 Hyperlipidemia, unspecified: Secondary | ICD-10-CM | POA: Diagnosis not present

## 2019-10-12 DIAGNOSIS — I2510 Atherosclerotic heart disease of native coronary artery without angina pectoris: Secondary | ICD-10-CM | POA: Diagnosis not present

## 2019-10-12 DIAGNOSIS — R0789 Other chest pain: Secondary | ICD-10-CM | POA: Diagnosis not present

## 2019-10-12 DIAGNOSIS — Z87891 Personal history of nicotine dependence: Secondary | ICD-10-CM | POA: Diagnosis not present

## 2019-10-12 DIAGNOSIS — W1839XA Other fall on same level, initial encounter: Secondary | ICD-10-CM | POA: Diagnosis not present

## 2019-10-12 DIAGNOSIS — Z950 Presence of cardiac pacemaker: Secondary | ICD-10-CM | POA: Diagnosis not present

## 2019-10-12 DIAGNOSIS — I517 Cardiomegaly: Secondary | ICD-10-CM | POA: Diagnosis not present

## 2019-10-12 DIAGNOSIS — I10 Essential (primary) hypertension: Secondary | ICD-10-CM | POA: Diagnosis not present

## 2019-10-12 DIAGNOSIS — E119 Type 2 diabetes mellitus without complications: Secondary | ICD-10-CM | POA: Diagnosis not present

## 2019-10-12 NOTE — Telephone Encounter (Signed)
Got a page from the patient that he fell down today (tripped while walking the dog)  He has h/o ischemic CMP, LVEF 30-35% s/p [employer] ICD, CAD s/p pRCA in 2020. He has been on GDMT, anti anginal therapy Recent office visit 8/17 no complaints   Today, the patient reports he fell on his left side- no trauma, bruise, injury to the pacemaker site, no sob, no syncope. However, since evening he started to have heart burn symptoms that increases with movement. He took 1 SL nitro which eased off the pain. When I called him- he reports still has mild heart burn symptoms. No acid reflex/GERD symptoms per se. He did not have this pain last week, may be once or twice felt heart burn that did not neeed medication.  Recommendations - advised him to go to the nearest ER- [facility].  - he has taken a total of 2 SL nitro - heart burn- differential is GERD vs true angina. We also need to get CXR and physically evaluate him with labs, ekg. IF work up is negative then he can be discharged. If there is concern for true angina then needs to be admitted. - patient will go to the ER in a few mins   Renae Fickle, MD Cardiology

## 2019-10-14 DIAGNOSIS — R6889 Other general symptoms and signs: Secondary | ICD-10-CM | POA: Diagnosis not present

## 2019-10-16 ENCOUNTER — Encounter (INDEPENDENT_AMBULATORY_CARE_PROVIDER_SITE_OTHER): Payer: Medicare Other | Admitting: Ophthalmology

## 2019-10-17 ENCOUNTER — Ambulatory Visit: Payer: Medicare Other | Admitting: Family Medicine

## 2019-10-20 NOTE — Progress Notes (Signed)
    10/04/2019 Name: LAYSON BERTSCH MRN: [id number] DOB: 1955-11-13   S:  58 yoM presents for diabetes evaluation, education, and management Patient was referred and last seen by Primary Care Provider on 09/03/19.  Insurance coverage/medication affordability: [employer] medicare  Patient reports adherence with medications.  Current diabetes medications include: toujeo, ozempic  Current hypertension medications include: coreg, isosorbide, hydralazine Goal 130/80  Current hyperlipidemia medications include: rosuvastatin   Patient denies hypoglycemic events.   Patient reported dietary habits: Eats 2-3 meals/day Discussed meal planning options and Plate method for healthy eating  Avoid sugary drinks and desserts  Incorporate balanced protein, non starchy veggies, 1 serving of carbohydrate with each meal  Increase water intake  Increase physical activity as able   Breakfast:jimmy dean bagels with cheese   Lunch:skips   Dinner:pinto beans, [place] necks (alternates protein)   Snacks: enjoys deserts (attempting to cut back)   Drinks:gatorade G2, or zero sugar  Patient-reported exercise habits: walks, but having leg pain   O:  Lab Results  Component Value Date   HGBA1C >14.0 (H) 09/03/2019     Lipid Panel     Component Value Date/Time   CHOL 188 09/03/2019 1252   CHOL 277 (H) 07/11/2012 1308   TRIG 165 (H) 09/03/2019 1252   TRIG 658 (HH) 12/16/2013 1459   TRIG 522 (H) 07/11/2012 1308   HDL 40 09/03/2019 1252   HDL 42 12/16/2013 1459   HDL 39 (L) 07/11/2012 1308   CHOLHDL 4.7 09/03/2019 1252   CHOLHDL 8.2 01/22/2019 1600   VLDL UNABLE TO CALCULATE IF TRIGLYCERIDE OVER 400 mg/dL 01/22/2019 1600   LDLCALC 119 (H) 09/03/2019 1252   LDLCALC 144 (H) 05/10/2013 1225   LDLCALC 134 (H) 07/11/2012 1308   LDLDIRECT 135.2 (H) 01/22/2019 1600     Home fasting blood sugars: 180-200  2 hour post-meal/random blood sugars: 200s  A/P:  Diabetes T2DM3 currently  uncontrolled, A1c >14%.  Patient is adherent with medication. Control is suboptimal due to diet.  -Continue Toujeo (basal insulin) --50 units in the AM   -Started GLP-1 Ozempic (generic name: semglutide)   Instructions: Inject 0.5mg  weekly thereafter (as tolerated) -- on mondays  Increase to 1mg  Ozempic--Switch in 1 month pending patient tolerance  -Will add on SGLT2 at next visit (Farxiga) --GFR 31, however Wilder Glade is indicated down to GFR 25  -Re-applied libre CGM to aid in positive patient outcomes  -Extensively discussed pathophysiology of diabetes, recommended lifestyle interventions, dietary effects on blood sugar control  -Counseled on s/sx of and management of hypoglycemia  -Next A1C anticipated    Written patient instructions provided.  Total time in face to face counseling 30 minutes.   Follow up Pharmacist Clinic Visit ON 10/29/19.   Regina Eck, PharmD, BCPS Clinical Pharmacist, [place]  [place] Phone [phone]

## 2019-10-22 ENCOUNTER — Telehealth: Payer: Self-pay | Admitting: Family Medicine

## 2019-10-22 NOTE — Telephone Encounter (Signed)
Brad Mendoza not working Will set up patient on thursday

## 2019-10-24 ENCOUNTER — Other Ambulatory Visit: Payer: Self-pay | Admitting: Family Medicine

## 2019-10-24 DIAGNOSIS — I10 Essential (primary) hypertension: Secondary | ICD-10-CM

## 2019-10-29 ENCOUNTER — Ambulatory Visit: Payer: Self-pay | Admitting: Pharmacist

## 2019-11-06 ENCOUNTER — Other Ambulatory Visit: Payer: Self-pay

## 2019-11-06 ENCOUNTER — Ambulatory Visit (INDEPENDENT_AMBULATORY_CARE_PROVIDER_SITE_OTHER): Payer: Medicare Other | Admitting: Family Medicine

## 2019-11-06 ENCOUNTER — Encounter: Payer: Self-pay | Admitting: Family Medicine

## 2019-11-06 VITALS — BP 130/88 | HR 79 | Temp 98.7°F | Resp 20 | Ht 69.0 in | Wt 210.0 lb

## 2019-11-06 DIAGNOSIS — Z23 Encounter for immunization: Secondary | ICD-10-CM

## 2019-11-06 DIAGNOSIS — Z794 Long term (current) use of insulin: Secondary | ICD-10-CM

## 2019-11-06 DIAGNOSIS — E1142 Type 2 diabetes mellitus with diabetic polyneuropathy: Secondary | ICD-10-CM

## 2019-11-06 MED ORDER — OZEMPIC (1 MG/DOSE) 2 MG/1.5ML ~~LOC~~ SOPN: 1.0000 mg | PEN_INJECTOR | SUBCUTANEOUS | 2 refills | Status: DC

## 2019-11-08 ENCOUNTER — Encounter (INDEPENDENT_AMBULATORY_CARE_PROVIDER_SITE_OTHER): Payer: Medicare Other | Admitting: Ophthalmology

## 2019-11-08 NOTE — Progress Notes (Signed)
[facility] Note  11/13/2019     CHIEF COMPLAINT Patient presents for Retina Follow Up   HISTORY OF PRESENT ILLNESS: Brad Mendoza is a 64 y.o. male who presents to the clinic today for:   HPI    Retina Follow Up    Patient presents with  Diabetic Retinopathy.  In both eyes.  This started months ago.  Severity is moderate.  Duration of 2 months.  Since onset it is gradually improving.  I, the attending physician,  performed the HPI with the patient and updated documentation appropriately.          Comments    64 y/o male pt here for 2 mo f/u for mod NPDR w/DME OU.  Pt was supposed to return in 4 wks.  No change in [place] OU noticed.  Denies pain, FOL, floaters.  No gtts.  BS 80 this a.m.  A1C unknown.       Last edited by [name], MD on 11/13/2019  8:46 AM. (History)    pt states no change in vision  Referring physician: Harlen Labs, MD [address] [place],  [place] [address]  HISTORICAL INFORMATION:   Selected notes from the MEDICAL RECORD NUMBER Referred by Dr. [name] for concern of CSME OS;  LEE- 04.04.19 ([name]) [BCVA OD: 20/20 OS: 20/40] Ocular Hx-  PMH- DM, CAD, CHF, HTN, hyperlipidemia    CURRENT MEDICATIONS: No current outpatient medications on file. (Ophthalmic Drugs)   No current facility-administered medications for this visit. (Ophthalmic Drugs)   Current Outpatient Medications (Other)  Medication Sig  . aspirin EC 81 MG tablet Take 81 mg by mouth daily.  . blood glucose meter kit and supplies KIT Dispense based on patient and insurance preference. Test four times daily as directed. (FOR ICD-10 : E11.9  . Blood Glucose Monitoring Suppl (ONETOUCH VERIO) w/Device KIT 1 Device by Does not apply route 2 (two) times daily.  . Capsaicin (ICY HOT ARTHRITIS THERAPY EX) Apply 1 application topically 2 (two) times daily as needed (back pain).   . carvedilol (COREG) 6.25 MG tablet Take 1 tablet (6.25 mg total) by mouth 2 (two) times  daily with a meal.  . Cholecalciferol (VITAMIN D3) 25 MCG (1000 UT) CAPS Take 2,000 Units by mouth daily.  . Continuous Blood Gluc Sensor (FREESTYLE LIBRE 2 SENSOR) MISC Use to test blood sugar up to 6 times daily as directed. E11.9  . fluticasone (FLONASE) 50 MCG/ACT nasal spray USE 2 SPRAYS IN EACH NOSTRIL DAILY  . glucose blood (ONETOUCH VERIO) test strip USE FOUR TIMES DAILY AS NEEDED Dx E11.9  . hydrALAZINE (APRESOLINE) 100 MG tablet TAKE (1) TABLET BY MOUTH (3) TIMES DAILY.  [name] icosapent Ethyl (VASCEPA) 1 g capsule Take 2 capsules (2 g total) by mouth 2 (two) times daily.  . insulin glargine, 2 Unit Dial, (TOUJEO MAX SOLOSTAR) 300 UNIT/ML Solostar Pen Inject 50 units each morning and 40 units each evening  . isosorbide mononitrate (IMDUR) 120 MG 24 hr tablet TAKE (1) TABLET BY MOUTH ONCE DAILY.  [name] Lancets (ONETOUCH ULTRASOFT) lancets Use as instructed   DX E11.9  . loratadine (CLARITIN) 10 MG tablet Take 1 tablet (10 mg total) by mouth daily.  . Multiple Vitamins-Minerals (CENTRUM SILVER ADULT 50+ PO) Take 1 tablet by mouth daily.  . nabumetone (RELAFEN) 500 MG tablet TAKE TWO TABLETS BY MOUTH TWICE DAILY  . nitroGLYCERIN (NITROSTAT) 0.4 MG SL tablet DISSOLVE 1 TAB UNDER  TOUNGE FOR CHEST PAIN. MAY REPEAT EVERY 5 MINUTES FOR 3 DOSES. IF NO RELIEF CALL 911 OR GO TO ER  . Omega-3 Fatty Acids (FISH OIL) 1000 MG CPDR Take 1,000 mg by mouth daily.   . pantoprazole (PROTONIX) 40 MG tablet Take 1 tablet (40 mg total) by mouth daily.  . potassium chloride SA (KLOR-CON) 20 MEQ tablet TAKE (1) TABLET BY MOUTH ONCE DAILY.  [name] pregabalin (LYRICA) 200 MG capsule Take 2 capsules (400 mg total) by mouth at bedtime.  [name] PROAIR HFA 108 (90 Base) MCG/ACT inhaler USE 2 PUFFS EVERY 6 HOURS AS NEEDED  . ranolazine (RANEXA) 500 MG 12 hr tablet TAKE 1 TABLET BY MOUTH TWICE DAILY.  . rosuvastatin (CRESTOR) 20 MG tablet Take 20 mg by mouth daily.  . Semaglutide, 1 MG/DOSE, (OZEMPIC, 1 MG/DOSE,) 2 MG/1.5ML SOPN Inject  1 mg into the skin once a week.  . Semaglutide,0.25 or 0.5MG/DOS, (OZEMPIC, 0.25 OR 0.5 MG/DOSE,) 2 MG/1.5ML SOPN Inject 0.5 mg into the skin once a week.  . terbinafine (LAMISIL) 1 % cream Apply 1 application topically 2 (two) times daily. Apply to both feet and between toes  . tiZANidine (ZANAFLEX) 4 MG tablet Take 1 tablet (4 mg total) by mouth every 6 (six) hours as needed for muscle spasms.  [name] torsemide (DEMADEX) 20 MG tablet TAKE 3 TABLETS BY MOUTH DAILY.  [name] XARELTO 2.5 MG TABS tablet TAKE (1) TABLET BY MOUTH TWICE DAILY.   No current facility-administered medications for this visit. (Other)      REVIEW OF SYSTEMS: ROS    Positive for: Gastrointestinal, Genitourinary, Endocrine, Cardiovascular, Eyes   Negative for: Constitutional, Neurological, Skin, Musculoskeletal, HENT, Respiratory, Psychiatric, Allergic/Imm, Heme/Lymph   Last edited by Matthew Folks, COA on 11/13/2019  7:45 AM. (History)       ALLERGIES Allergies  Allergen Reactions  . Sulfa Antibiotics Shortness Of Breath  . Penicillins Rash and Other (See Comments)    Has patient had a PCN reaction causing immediate rash, facial/tongue/throat swelling, SOB or lightheadedness with hypotension: No Has patient had a PCN reaction causing severe rash involving mucus membranes or skin necrosis: No Has patient had a PCN reaction that required hospitalization No Has patient had a PCN reaction occurring within the last 10 years: No If all of the above answers are "NO", then may proceed with Cephalosporin use.     PAST MEDICAL HISTORY Past Medical History:  Diagnosis Date  . CAD (coronary artery disease)    DES to RCA January 2020  . Chronic back pain   . CKD (chronic kidney disease), stage IV ([place])   . Diverticulosis   . Essential hypertension   . GERD (gastroesophageal reflux disease)   . Hyperlipidemia   . ICD (implantable cardioverter-defibrillator) in place    [facility] - Dr. Lovena Le, implanted April 2021   . Onychomycosis   . Polysubstance abuse ([place])   . 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 Type 2 diabetes mellitus ([place])    Past Surgical History:  Procedure Laterality Date  . APPENDECTOMY    . CARDIAC CATHETERIZATION  2005   4 frech cath  . COLONOSCOPY N/A 08/01/2013   Procedure: COLONOSCOPY;  Surgeon: Rogene Houston, MD;  [facility];  Service: Endoscopy;  Laterality: N/A;  rescheduled to [place] notified pt  . CORONARY STENT INTERVENTION N/A 03/19/2018   Procedure: CORONARY STENT INTERVENTION;  Surgeon: Leonie Man, MD;  Location: [place] [facility];  Service: Cardiovascular;  Laterality: N/A;  . ESOPHAGOGASTRODUODENOSCOPY N/A 04/23/2015   Procedure: ESOPHAGOGASTRODUODENOSCOPY (EGD);  Surgeon: Rogene Houston, MD;  [facility]: [facility];  Service: Endoscopy;  Laterality: N/A;  1:25  . RIGHT/LEFT HEART CATH AND CORONARY ANGIOGRAPHY N/A 06/03/2016   Procedure: Right/Left Heart Cath and Coronary Angiography;  Surgeon: Belva Crome, MD;  Location: [place] [facility];  Service: Cardiovascular;  Laterality: N/A;  . RIGHT/LEFT HEART CATH AND CORONARY ANGIOGRAPHY N/A 03/19/2018   Procedure: RIGHT/LEFT HEART CATH AND CORONARY ANGIOGRAPHY;  Surgeon: Jolaine Artist, MD;  Location: [place] [facility];  Service: Cardiovascular;  Laterality: N/A;  . SUBQ ICD IMPLANT N/A 05/27/2019   Procedure: SUBQ ICD IMPLANT;  Surgeon: Evans Lance, MD;  Location: [place] [facility];  Service: Cardiovascular;  Laterality: N/A;    FAMILY HISTORY Family History  Problem Relation Age of Onset  . Hypertension Father   . Diabetes Father   . Lung cancer Mother   . Alcohol abuse Brother   . Colon cancer Neg Hx     SOCIAL HISTORY Social History   Tobacco Use  . Smoking status: Never Smoker  . Smokeless tobacco: Current User    Types: Snuff  . Tobacco comment: dips snuff about 6 cans/week for 3 years  Vaping Use  . Vaping Use: Never used  Substance Use  Topics  . Alcohol use: Yes    Alcohol/week: 1.0 standard drink    Types: 1 Cans of beer per week    Comment: occ - has cut down, now once every 2-3 months  . Drug use: Yes    Types: Marijuana    Comment: occasional use - smoking less         OPHTHALMIC EXAM:  Base Eye Exam    Visual Acuity (Snellen - Linear)      Right Left   Dist cc 20/40 20/50   Dist ph cc NI NI   Correction: Glasses       Tonometry (Tonopen, 7:47 AM)      Right Left   Pressure 11 10       Pupils      Dark Light Shape React APD   Right 4 3 Round Brisk None   Left 4 3 Round Brisk None       Visual [place] (Counting fingers)      Left Right    Full Full       Extraocular Movement      Right Left    Full, Ortho Full, Ortho       Neuro/Psych    Oriented x3: Yes   Mood/Affect: Normal       Dilation    Both eyes: 1.0% Mydriacyl, 2.5% Phenylephrine @ 7:47 AM        Slit Lamp and Fundus Exam    Slit Lamp Exam      Right Left   Lids/Lashes Dermatochalasis - upper lid Dermatochalasis - upper lid   Conjunctiva/Sclera Melanosis Melanosis   Cornea Arcus, trace Punctate epithelial erosions Arcus, trace Punctate epithelial erosions, round k scar inferonasal para-central   Anterior Chamber Deep and quiet Deep and quiet   Iris Round and dilated, No NVI Round and dilated, No NVI   Lens 2-3+ Nuclear sclerosis, 2-3+ Cortical cataract, Vacuoles 2+ Nuclear sclerosis, 2+ Cortical cataract, Vacuoles   Vitreous Vitreous syneresis, prominent vitreous base nasally , vitreous condensations? Silicone oil superiorly Vitreous syneresis, prominent vitreous base nasally       Fundus Exam  Right Left   Disc mild Pallor, Sharp rim 1-2+Pallor, Sharp rim   C/D Ratio 0.4 0.4   Macula Flat, blunted foveal reflex, central cystic changes - slightly increased Blunted foveal reflex, scattered MA, +cystic changes/edema, CWS along ST arcades   Vessels Mild Vascular attenuation, Tortuous Mild Vascular attenuation,  Tortuous   Periphery Attached, scattered MAs, scattered White without pressure, scattered midzonal drusen, scattered DBH Attached, scattered IRH/DBH, scattered White without pressure, mild midzonal drusen          IMAGING AND PROCEDURES  Imaging and Procedures for 05/31/17  OCT, Retina - OU - Both Eyes       Right Eye Quality was good. Central Foveal Thickness: 345. Progression has worsened. Findings include no SRF, intraretinal fluid, abnormal foveal contour, vitreomacular adhesion  (Interval increase in central IRF).   Left Eye Quality was good. Central Foveal Thickness: 268. Progression has improved. Findings include intraretinal fluid, no SRF, abnormal foveal contour (Interval improvement in temporal IRF/cystic changes).   Notes *Images captured and stored on drive  Diagnosis / Impression:  DME OU, OS>OD OD: Interval increase in central IRF/DME  OS: Interval improvement in temporal IRF/cystic changes  Clinical management:  See below  Abbreviations: NFP - Normal foveal profile. CME - cystoid macular edema. PED - pigment epithelial detachment. IRF - intraretinal fluid. SRF - subretinal fluid. EZ - ellipsoid zone. ERM - epiretinal membrane. ORA - outer retinal atrophy. ORT - outer retinal tubulation. SRHM - subretinal hyper-reflective material         Intravitreal Injection, Pharmacologic Agent - OD - Right Eye       Time Out 11/13/2019. 8:17 AM. Confirmed correct patient, procedure, site, and patient consented.   Anesthesia Topical anesthesia was used. Anesthetic medications included Lidocaine 2%, Proparacaine 0.5%.   Procedure Preparation included 5% betadine to ocular surface, eyelid speculum. A (32g) needle was used.   Injection:  1.25 mg Bevacizumab (AVASTIN) SOLN   NDC: 15520-802-23, Lot: [id number], Expiration date: 12/19/2019   Route: Intravitreal, Site: Right Eye, Waste: 0.05 mL  Post-op Post injection exam found visual acuity of at least counting  fingers. The patient tolerated the procedure well. There were no complications. The patient received written and verbal post procedure care education.        Intravitreal Injection, Pharmacologic Agent - OS - Left Eye       Time Out 11/13/2019. 8:18 AM. Confirmed correct patient, procedure, site, and patient consented.   Anesthesia Topical anesthesia was used. Anesthetic medications included Proparacaine 0.5%, Lidocaine 2%.   Procedure Preparation included 5% betadine to ocular surface, eyelid speculum. A (32g) needle was used.   Injection:  2 mg aflibercept Alfonse Flavors) SOLN   NDC: A3590391, Lot: [id number], Expiration date: 01/22/2020   Route: Intravitreal, Site: Left Eye, Waste: 0.05 mL  Post-op Post injection exam found visual acuity of at least counting fingers. The patient tolerated the procedure well. There were no complications. The patient received written and verbal post procedure care education.                 ASSESSMENT/PLAN:    ICD-10-CM   1. Moderate nonproliferative diabetic retinopathy of both eyes with macular edema associated with type 2 diabetes mellitus (HCC)  Y11.1735 Intravitreal Injection, Pharmacologic Agent - OD - Right Eye    Intravitreal Injection, Pharmacologic Agent - OS - Left Eye    Bevacizumab (AVASTIN) SOLN 1.25 mg    aflibercept (EYLEA) SOLN 2 mg  2. Retinal edema  H35.81 OCT, Retina - OU -  Both Eyes  3. Essential hypertension  I10   4. Hypertensive retinopathy of both eyes  H35.033   5. Combined forms of age-related cataract of both eyes  H25.813     1,2. Moderate to severe non-proliferative diabetic retinopathy, OU  - delayed f/u from 4 wks to 8 mos due to contracting COVID in Dec 2020  - initial exam showed scattered IRH, exudates, and cotton wool spots, OS > OD, no NV  - FA 4.10.19 shows no NV OU  - S/P IVA OS #1 (05.29.19), #2 (06.26.19), #3 (07.29.19), #4 (07.22.21)  - S/P IVE OS #1 (08.30.19), #2 (10.01.19), #3 (10.31.19), #4  (03.02.20), #5 (04.07.20), #6 (07.01.20), #7 (11.12.20), #8 (07.22.21)  - S/P IVA OD #1 (04.07.20), #2 (07.01.20), #3 (11.12.20), #4 (07.22.2021)  - last A1c >14% on 7.13.21 -- pt reports poor control due to poor diet  - exam shows DME / cystic changes OU  - OCT shows interval increase in diabetic macular edema OD; interval improvement in IRF/cystic changes OS  - BCVA 20/40 OD (stable), 20/50 OS (dec from 20/60)  - recommend IVA #5 OD and IVE #9 OS today, 09.22.21  - pt wishes to proceed  - RBA of procedure discussed, questions answered  - informed consent obtained and signed  - see procedure note  - Eylea4U paper work and benefits investigation started on 07.29.19 -- Approved for Good Days for 2021  - f/u in 4 weeks -- DFE; OCT; possible injection  3,4. Hypertensive retinopathy OU  - discussed importance of tight BP control  - monitor  5. Combined form age-related cataract OU-   - The symptoms of cataract, surgical options, and treatments and risks were discussed with patient.  - discussed diagnosis and progression  - not yet visually significant  - monitor for now   Ophthalmic Meds Ordered this visit:  Meds ordered this encounter  Medications  . Bevacizumab (AVASTIN) SOLN 1.25 mg  . aflibercept (EYLEA) SOLN 2 mg       Return in about 4 weeks (around 12/11/2019) for f/u NPDR OU, DFE, OCT.  There are no Patient Instructions on file for this visit.   Explained the diagnoses, plan, and follow up with the patient and they expressed understanding.  Patient expressed understanding of the importance of proper follow up care.   This document serves as a record of services personally performed by Gardiner Sleeper, MD, PhD. It was created on their behalf by Roselee Nova, COMT. The creation of this record is the provider's dictation and/or activities during the visit.  Electronically signed by: Roselee Nova, COMT 11/13/19 9:14 AM   This document serves as a record of services  personally performed by Gardiner Sleeper, MD, PhD. It was created on their behalf by San Jetty. Owens Shark, OA an ophthalmic technician. The creation of this record is the provider's dictation and/or activities during the visit.    Electronically signed by: San Jetty. Owens Shark, [place] 09.22.2021 9:14 AM  Gardiner Sleeper, M.D., Ph.D. Diseases & Surgery of the Retina and Vitreous [facility]  I have reviewed the above documentation for accuracy and completeness, and I agree with the above. Gardiner Sleeper, M.D., Ph.D. 11/13/19 9:16 AM   Abbreviations: M myopia (nearsighted); A astigmatism; H hyperopia (farsighted); P presbyopia; Mrx spectacle prescription;  CTL contact lenses; OD right eye; OS left eye; OU both eyes  XT exotropia; ET esotropia; PEK punctate epithelial keratitis; PEE punctate epithelial erosions; DES dry eye syndrome; MGD  meibomian gland dysfunction; ATs artificial tears; PFAT's preservative free artificial tears; [place] nuclear sclerotic cataract; PSC posterior subcapsular cataract; ERM epi-retinal membrane; PVD posterior vitreous detachment; RD retinal detachment; DM diabetes mellitus; DR diabetic retinopathy; NPDR non-proliferative diabetic retinopathy; PDR proliferative diabetic retinopathy; CSME clinically significant macular edema; DME diabetic macular edema; dbh dot blot hemorrhages; CWS cotton wool spot; POAG primary open angle glaucoma; C/D cup-to-disc ratio; HVF humphrey visual field; GVF goldmann visual field; OCT optical coherence tomography; IOP intraocular pressure; BRVO Branch retinal vein occlusion; CRVO central retinal vein occlusion; CRAO central retinal artery occlusion; BRAO branch retinal artery occlusion; RT retinal tear; SB scleral buckle; PPV pars plana vitrectomy; VH Vitreous hemorrhage; PRP panretinal laser photocoagulation; IVK intravitreal kenalog; VMT vitreomacular traction; MH Macular hole;  NVD neovascularization of the disc; NVE neovascularization  elsewhere; AREDS age related eye disease study; ARMD age related macular degeneration; POAG primary open angle glaucoma; EBMD epithelial/anterior basement membrane dystrophy; ACIOL anterior chamber intraocular lens; IOL intraocular lens; PCIOL posterior chamber intraocular lens; Phaco/IOL phacoemulsification with intraocular lens placement; [place] photorefractive keratectomy; LASIK laser assisted in situ keratomileusis; HTN hypertension; DM diabetes mellitus; COPD chronic obstructive pulmonary disease

## 2019-11-09 ENCOUNTER — Encounter: Payer: Self-pay | Admitting: Family Medicine

## 2019-11-09 NOTE — Progress Notes (Signed)
Subjective:  Patient ID: Brad Mendoza, male    DOB: 02-Jun-1955  Age: 64 y.o. MRN: [id number]  CC: 1 month follow up   HPI RAMIEL FORTI presents forFollow-up of diabetes. Patient checks blood sugar at home.   200-300 fasting and  postprandial Patient denies symptoms such as polyuria, polydipsia, excessive hunger, nausea No significant hypoglycemic spells noted. Medications reviewed. Pt reports taking them regularly without complication/adverse reaction being reported today.  Checking feet daily.   History Kaymen has a past medical history of CAD (coronary artery disease), Chronic back pain, CKD (chronic kidney disease), stage IV ([place]), Diverticulosis, Essential hypertension, GERD (gastroesophageal reflux disease), Hyperlipidemia, ICD (implantable cardioverter-defibrillator) in place, Onychomycosis, Polysubstance abuse ([place]), Secondary cardiomyopathy ([place]), and Type 2 diabetes mellitus ([place]).   He has a past surgical history that includes Appendectomy; Colonoscopy (N/A, 08/01/2013); Cardiac catheterization (2005); Esophagogastroduodenoscopy (N/A, 04/23/2015); RIGHT/LEFT HEART CATH AND CORONARY ANGIOGRAPHY (N/A, 06/03/2016); RIGHT/LEFT HEART CATH AND CORONARY ANGIOGRAPHY (N/A, 03/19/2018); CORONARY STENT INTERVENTION (N/A, 03/19/2018); and SUBQ ICD IMPLANT (N/A, 05/27/2019).   His family history includes Alcohol abuse in his brother; Diabetes in his father; Hypertension in his father; Lung cancer in his mother.He reports that he has never smoked. His smokeless tobacco use includes snuff. He reports current alcohol use of about 1.0 standard drink of alcohol per week. He reports current drug use. Drug: Marijuana.  Current Outpatient Medications on File Prior to Visit  Medication Sig Dispense Refill  . aspirin EC 81 MG tablet Take 81 mg by mouth daily.    . blood glucose meter kit and supplies KIT Dispense based on patient and insurance preference. Test four times daily as directed. (FOR ICD-10 :  E11.9 1 each 0  . Blood Glucose Monitoring Suppl (ONETOUCH VERIO) w/Device KIT 1 Device by Does not apply route 2 (two) times daily. 1 kit 0  . Capsaicin (ICY HOT ARTHRITIS THERAPY EX) Apply 1 application topically 2 (two) times daily as needed (back pain).     . carvedilol (COREG) 6.25 MG tablet Take 1 tablet (6.25 mg total) by mouth 2 (two) times daily with a meal. 180 tablet 1  . Cholecalciferol (VITAMIN D3) 25 MCG (1000 UT) CAPS Take 2,000 Units by mouth daily.    . Continuous Blood Gluc Sensor (FREESTYLE LIBRE 2 SENSOR) MISC Use to test blood sugar up to 6 times daily as directed. E11.9 1 each 10  . fluticasone (FLONASE) 50 MCG/ACT nasal spray USE 2 SPRAYS IN EACH NOSTRIL DAILY 48 g 1  . glucose blood (ONETOUCH VERIO) test strip USE FOUR TIMES DAILY AS NEEDED Dx E11.9 100 each 11  . hydrALAZINE (APRESOLINE) 100 MG tablet TAKE (1) TABLET BY MOUTH (3) TIMES DAILY. 90 tablet 0  . icosapent Ethyl (VASCEPA) 1 g capsule Take 2 capsules (2 g total) by mouth 2 (two) times daily. 360 capsule 1  . insulin glargine, 2 Unit Dial, (TOUJEO MAX SOLOSTAR) 300 UNIT/ML Solostar Pen Inject 50 units each morning and 40 units each evening 33 mL 2  . isosorbide mononitrate (IMDUR) 120 MG 24 hr tablet TAKE (1) TABLET BY MOUTH ONCE DAILY. 30 tablet 0  . Lancets (ONETOUCH ULTRASOFT) lancets Use as instructed   DX E11.9 100 each 12  . loratadine (CLARITIN) 10 MG tablet Take 1 tablet (10 mg total) by mouth daily. 30 tablet 11  . Multiple Vitamins-Minerals (CENTRUM SILVER ADULT 50+ PO) Take 1 tablet by mouth daily.    . nabumetone (RELAFEN) 500 MG tablet TAKE TWO TABLETS BY MOUTH  TWICE DAILY 360 tablet 1  . nitroGLYCERIN (NITROSTAT) 0.4 MG SL tablet DISSOLVE 1 TAB UNDER TOUNGE FOR CHEST PAIN. MAY REPEAT EVERY 5 MINUTES FOR 3 DOSES. IF NO RELIEF CALL 911 OR GO TO ER 25 tablet 2  . Omega-3 Fatty Acids (FISH OIL) 1000 MG CPDR Take 1,000 mg by mouth daily.     . pantoprazole (PROTONIX) 40 MG tablet Take 1 tablet (40 mg  total) by mouth daily. 60 tablet 5  . potassium chloride SA (KLOR-CON) 20 MEQ tablet TAKE (1) TABLET BY MOUTH ONCE DAILY. 30 tablet 0  . pregabalin (LYRICA) 200 MG capsule Take 2 capsules (400 mg total) by mouth at bedtime. 60 capsule 2  . PROAIR HFA 108 (90 Base) MCG/ACT inhaler USE 2 PUFFS EVERY 6 HOURS AS NEEDED 8.5 g 3  . ranolazine (RANEXA) 500 MG 12 hr tablet TAKE 1 TABLET BY MOUTH TWICE DAILY. 180 tablet 3  . rosuvastatin (CRESTOR) 20 MG tablet Take 20 mg by mouth daily.    . Semaglutide,0.25 or 0.5MG/DOS, (OZEMPIC, 0.25 OR 0.5 MG/DOSE,) 2 MG/1.5ML SOPN Inject 0.5 mg into the skin once a week. 1 pen 2  . terbinafine (LAMISIL) 1 % cream Apply 1 application topically 2 (two) times daily. Apply to both feet and between toes 30 g 1  . tiZANidine (ZANAFLEX) 4 MG tablet Take 1 tablet (4 mg total) by mouth every 6 (six) hours as needed for muscle spasms. 60 tablet 5  . torsemide (DEMADEX) 20 MG tablet TAKE 3 TABLETS BY MOUTH DAILY. 90 tablet 0  . XARELTO 2.5 MG TABS tablet TAKE (1) TABLET BY MOUTH TWICE DAILY. 60 tablet 11   No current facility-administered medications on file prior to visit.    ROS Review of Systems  Constitutional: Negative for fever.  Respiratory: Negative for shortness of breath.   Cardiovascular: Negative for chest pain.  Musculoskeletal: Negative for arthralgias.  Skin: Negative for rash.    Objective:  BP 130/88   Pulse 79   Temp 98.7 F (37.1 C) (Temporal)   Resp 20   Ht 5' 9"  (1.753 m)   Wt 210 lb (95.3 kg)   SpO2 96%   BMI 31.01 kg/m   BP Readings from Last 3 Encounters:  11/06/19 130/88  10/08/19 132/76  09/16/19 (!) 130/85    Wt Readings from Last 3 Encounters:  11/06/19 210 lb (95.3 kg)  10/08/19 202 lb (91.6 kg)  09/16/19 (!) 206 lb 3.2 oz (93.5 kg)     Physical Exam Vitals reviewed.  Constitutional:      Appearance: He is well-developed.  HENT:     Head: Normocephalic and atraumatic.     Right Ear: Tympanic membrane and external  ear normal. No decreased hearing noted.     Left Ear: Tympanic membrane and external ear normal. No decreased hearing noted.     Mouth/Throat:     Pharynx: No oropharyngeal exudate or posterior oropharyngeal erythema.  Eyes:     Pupils: Pupils are equal, round, and reactive to light.  Cardiovascular:     Rate and Rhythm: Normal rate and regular rhythm.     Heart sounds: No murmur heard.   Pulmonary:     Effort: No respiratory distress.     Breath sounds: Normal breath sounds.  Abdominal:     General: Bowel sounds are normal.     Palpations: Abdomen is soft. There is no mass.     Tenderness: There is no abdominal tenderness.  Musculoskeletal:  Cervical back: Normal range of motion and neck supple.       Assessment & Plan:   Telesforo was seen today for 1 month follow up.  Diagnoses and all orders for this visit:  Need for immunization against influenza -     Flu Vaccine QUAD 36+ mos IM  Type 2 diabetes mellitus with diabetic polyneuropathy, with long-term current use of insulin ([place])  Other orders -     Semaglutide, 1 MG/DOSE, (OZEMPIC, 1 MG/DOSE,) 2 MG/1.5ML SOPN; Inject 1 mg into the skin once a week.      I am having Kassem L. Fregia start on Ozempic (1 MG/DOSE). I am also having him maintain his aspirin EC, Multiple Vitamins-Minerals (CENTRUM SILVER ADULT 50+ PO), Capsaicin (ICY HOT ARTHRITIS THERAPY EX), Fish Oil, Vitamin D3, terbinafine, nabumetone, fluticasone, ProAir HFA, nitroGLYCERIN, icosapent Ethyl, carvedilol, pregabalin, blood glucose meter kit and supplies, OneTouch Verio, loratadine, Ozempic (0.25 or 0.5 MG/DOSE), Toujeo Max SoloStar, ranolazine, pantoprazole, rosuvastatin, OneTouch Verio, onetouch ultrasoft, tiZANidine, FreeStyle Libre 2 Sensor, Xarelto, potassium chloride SA, isosorbide mononitrate, hydrALAZINE, and torsemide.  Meds ordered this encounter  Medications  . Semaglutide, 1 MG/DOSE, (OZEMPIC, 1 MG/DOSE,) 2 MG/1.5ML SOPN    Sig: Inject 1 mg  into the skin once a week.    Dispense:  3 mL    Refill:  2     Follow-up: Return in about 1 month (around 12/06/2019).  [name], M.D.

## 2019-11-11 ENCOUNTER — Telehealth: Payer: Self-pay | Admitting: Family Medicine

## 2019-11-11 ENCOUNTER — Other Ambulatory Visit: Payer: Self-pay | Admitting: Family Medicine

## 2019-11-11 DIAGNOSIS — J189 Pneumonia, unspecified organism: Secondary | ICD-10-CM

## 2019-11-11 MED ORDER — PROAIR HFA 108 (90 BASE) MCG/ACT IN AERS: INHALATION_SPRAY | RESPIRATORY_TRACT | 3 refills | Status: AC

## 2019-11-11 NOTE — Telephone Encounter (Signed)
Patient aware.

## 2019-11-11 NOTE — Telephone Encounter (Signed)
Please let the patient know that I sent their prescription to their pharmacy. Thanks, WS 

## 2019-11-11 NOTE — Telephone Encounter (Signed)
  Prescription Request  11/11/2019  What is the name of the medication or equipment? albuterol   Have you contacted your pharmacy to request a refill? (if applicable) no needs new rx   Which pharmacy would you like this sent to? [facility] in [place]  Pt has questions about how to take insulin and units. Please call back    Patient notified that their request is being sent to the clinical staff for review and that they should receive a response within 2 business days.

## 2019-11-13 ENCOUNTER — Ambulatory Visit (INDEPENDENT_AMBULATORY_CARE_PROVIDER_SITE_OTHER): Payer: Medicare Other | Admitting: Ophthalmology

## 2019-11-13 ENCOUNTER — Encounter (INDEPENDENT_AMBULATORY_CARE_PROVIDER_SITE_OTHER): Payer: Self-pay | Admitting: Ophthalmology

## 2019-11-13 ENCOUNTER — Other Ambulatory Visit: Payer: Self-pay

## 2019-11-13 DIAGNOSIS — H35033 Hypertensive retinopathy, bilateral: Secondary | ICD-10-CM | POA: Diagnosis not present

## 2019-11-13 DIAGNOSIS — H3581 Retinal edema: Secondary | ICD-10-CM

## 2019-11-13 DIAGNOSIS — H25813 Combined forms of age-related cataract, bilateral: Secondary | ICD-10-CM | POA: Diagnosis not present

## 2019-11-13 DIAGNOSIS — R6889 Other general symptoms and signs: Secondary | ICD-10-CM | POA: Diagnosis not present

## 2019-11-13 DIAGNOSIS — I10 Essential (primary) hypertension: Secondary | ICD-10-CM | POA: Diagnosis not present

## 2019-11-13 DIAGNOSIS — E113313 Type 2 diabetes mellitus with moderate nonproliferative diabetic retinopathy with macular edema, bilateral: Secondary | ICD-10-CM

## 2019-11-13 MED ORDER — AFLIBERCEPT 2MG/0.05ML IZ SOLN FOR KALEIDOSCOPE
2.0000 mg | INTRAVITREAL | Status: AC | PRN
  Administered 2019-11-13: 2 mg via INTRAVITREAL

## 2019-11-13 MED ORDER — BEVACIZUMAB CHEMO INJECTION 1.25MG/0.05ML SYRINGE FOR KALEIDOSCOPE
1.2500 mg | INTRAVITREAL | Status: AC | PRN
  Administered 2019-11-13: 1.25 mg via INTRAVITREAL

## 2019-11-14 ENCOUNTER — Other Ambulatory Visit: Payer: Medicare Other

## 2019-11-14 DIAGNOSIS — E1122 Type 2 diabetes mellitus with diabetic chronic kidney disease: Secondary | ICD-10-CM | POA: Diagnosis not present

## 2019-11-14 DIAGNOSIS — I129 Hypertensive chronic kidney disease with stage 1 through stage 4 chronic kidney disease, or unspecified chronic kidney disease: Secondary | ICD-10-CM | POA: Diagnosis not present

## 2019-11-14 DIAGNOSIS — E1129 Type 2 diabetes mellitus with other diabetic kidney complication: Secondary | ICD-10-CM | POA: Diagnosis not present

## 2019-11-14 DIAGNOSIS — N189 Chronic kidney disease, unspecified: Secondary | ICD-10-CM | POA: Diagnosis not present

## 2019-11-14 DIAGNOSIS — R809 Proteinuria, unspecified: Secondary | ICD-10-CM | POA: Diagnosis not present

## 2019-11-15 DIAGNOSIS — D638 Anemia in other chronic diseases classified elsewhere: Secondary | ICD-10-CM | POA: Diagnosis not present

## 2019-11-15 DIAGNOSIS — R809 Proteinuria, unspecified: Secondary | ICD-10-CM | POA: Diagnosis not present

## 2019-11-15 DIAGNOSIS — I129 Hypertensive chronic kidney disease with stage 1 through stage 4 chronic kidney disease, or unspecified chronic kidney disease: Secondary | ICD-10-CM | POA: Diagnosis not present

## 2019-11-15 DIAGNOSIS — N189 Chronic kidney disease, unspecified: Secondary | ICD-10-CM | POA: Diagnosis not present

## 2019-11-15 DIAGNOSIS — I5043 Acute on chronic combined systolic (congestive) and diastolic (congestive) heart failure: Secondary | ICD-10-CM | POA: Diagnosis not present

## 2019-11-20 ENCOUNTER — Other Ambulatory Visit: Payer: Self-pay | Admitting: Family Medicine

## 2019-11-20 DIAGNOSIS — I10 Essential (primary) hypertension: Secondary | ICD-10-CM

## 2019-11-29 ENCOUNTER — Telehealth: Payer: Self-pay

## 2019-11-29 NOTE — Telephone Encounter (Signed)
Toujeo sample is in refrigerator in the lab, patient informed.

## 2019-12-03 ENCOUNTER — Other Ambulatory Visit: Payer: Medicare Other

## 2019-12-03 ENCOUNTER — Other Ambulatory Visit: Payer: Self-pay

## 2019-12-03 DIAGNOSIS — R809 Proteinuria, unspecified: Secondary | ICD-10-CM | POA: Diagnosis not present

## 2019-12-03 DIAGNOSIS — N189 Chronic kidney disease, unspecified: Secondary | ICD-10-CM | POA: Diagnosis not present

## 2019-12-03 DIAGNOSIS — I129 Hypertensive chronic kidney disease with stage 1 through stage 4 chronic kidney disease, or unspecified chronic kidney disease: Secondary | ICD-10-CM | POA: Diagnosis not present

## 2019-12-03 DIAGNOSIS — E1129 Type 2 diabetes mellitus with other diabetic kidney complication: Secondary | ICD-10-CM | POA: Diagnosis not present

## 2019-12-03 DIAGNOSIS — E1122 Type 2 diabetes mellitus with diabetic chronic kidney disease: Secondary | ICD-10-CM | POA: Diagnosis not present

## 2019-12-05 DIAGNOSIS — N189 Chronic kidney disease, unspecified: Secondary | ICD-10-CM | POA: Diagnosis not present

## 2019-12-05 DIAGNOSIS — I5043 Acute on chronic combined systolic (congestive) and diastolic (congestive) heart failure: Secondary | ICD-10-CM | POA: Diagnosis not present

## 2019-12-05 DIAGNOSIS — E211 Secondary hyperparathyroidism, not elsewhere classified: Secondary | ICD-10-CM | POA: Diagnosis not present

## 2019-12-05 DIAGNOSIS — R809 Proteinuria, unspecified: Secondary | ICD-10-CM | POA: Diagnosis not present

## 2019-12-05 DIAGNOSIS — D638 Anemia in other chronic diseases classified elsewhere: Secondary | ICD-10-CM | POA: Diagnosis not present

## 2019-12-09 ENCOUNTER — Ambulatory Visit (INDEPENDENT_AMBULATORY_CARE_PROVIDER_SITE_OTHER): Payer: Medicare Other

## 2019-12-09 ENCOUNTER — Encounter: Payer: Self-pay | Admitting: Family Medicine

## 2019-12-09 ENCOUNTER — Ambulatory Visit (INDEPENDENT_AMBULATORY_CARE_PROVIDER_SITE_OTHER): Payer: Medicare Other | Admitting: Family Medicine

## 2019-12-09 ENCOUNTER — Other Ambulatory Visit: Payer: Self-pay

## 2019-12-09 VITALS — BP 131/82 | HR 79 | Temp 98.2°F | Ht 69.0 in | Wt 215.0 lb

## 2019-12-09 DIAGNOSIS — M546 Pain in thoracic spine: Secondary | ICD-10-CM

## 2019-12-09 DIAGNOSIS — I5042 Chronic combined systolic (congestive) and diastolic (congestive) heart failure: Secondary | ICD-10-CM

## 2019-12-09 DIAGNOSIS — Z794 Long term (current) use of insulin: Secondary | ICD-10-CM

## 2019-12-09 DIAGNOSIS — I10 Essential (primary) hypertension: Secondary | ICD-10-CM | POA: Diagnosis not present

## 2019-12-09 DIAGNOSIS — E7800 Pure hypercholesterolemia, unspecified: Secondary | ICD-10-CM | POA: Diagnosis not present

## 2019-12-09 DIAGNOSIS — R0789 Other chest pain: Secondary | ICD-10-CM

## 2019-12-09 DIAGNOSIS — N184 Chronic kidney disease, stage 4 (severe): Secondary | ICD-10-CM

## 2019-12-09 DIAGNOSIS — E1142 Type 2 diabetes mellitus with diabetic polyneuropathy: Secondary | ICD-10-CM

## 2019-12-09 DIAGNOSIS — E559 Vitamin D deficiency, unspecified: Secondary | ICD-10-CM | POA: Diagnosis not present

## 2019-12-09 DIAGNOSIS — I25119 Atherosclerotic heart disease of native coronary artery with unspecified angina pectoris: Secondary | ICD-10-CM

## 2019-12-09 DIAGNOSIS — J449 Chronic obstructive pulmonary disease, unspecified: Secondary | ICD-10-CM | POA: Diagnosis not present

## 2019-12-09 DIAGNOSIS — R059 Cough, unspecified: Secondary | ICD-10-CM | POA: Diagnosis not present

## 2019-12-09 LAB — BAYER DCA HB A1C WAIVED: HB A1C (BAYER DCA - WAIVED): 7.8 % — ABNORMAL HIGH (ref <7.0)

## 2019-12-09 MED ORDER — OZEMPIC (1 MG/DOSE) 2 MG/1.5ML ~~LOC~~ SOPN: 1.0000 mg | PEN_INJECTOR | SUBCUTANEOUS | 2 refills | Status: AC

## 2019-12-09 NOTE — Progress Notes (Signed)
Subjective:  Patient ID: Brad Mendoza, male    DOB: September 23, 1955  Age: 64 y.o. MRN: [id number]  CC: Follow-up (Diabetes)   HPI Brad Mendoza presents for presents forFollow-up of diabetes. Patient checks blood sugar at home.  No numbers given and no log returned.  The sore on. Patient denies symptoms such as polyuria, polydipsia, excessive hunger, nausea No significant hypoglycemic spells noted. Medications reviewed. Pt reports taking them regularly without complication/adverse reaction being reported today.    Patient states that his chest is hurting.  It is bilateral it is near the costal margin and through the epigastrium.  It radiates around to his back at the bottom of the shoulder blades/angle of the scapula.  It is not related to activity.  Brad Mendoza has noticed precordial pain and Brad Mendoza denies shortness of breath.  Brad Mendoza says his kidney doctor put him on a fourth torsemide every day and since then has had no swelling or shortness of breath.   Depression screen Brad Mendoza 2/9 12/09/2019 11/06/2019 09/16/2019  Decreased Interest 0 0 0  Down, Depressed, Hopeless 0 0 0  PHQ - 2 Score 0 0 0  Some recent data might be hidden    History Brad Mendoza has a past medical history of CAD (coronary artery disease), Chronic back pain, CKD (chronic kidney disease), stage IV ([place]), Diverticulosis, Essential hypertension, GERD (gastroesophageal reflux disease), Hyperlipidemia, ICD (implantable cardioverter-defibrillator) in place, Onychomycosis, Polysubstance abuse ([place]), Secondary cardiomyopathy ([place]), and Type 2 diabetes mellitus ([place]).   Brad Mendoza has a past surgical history that includes Appendectomy; Colonoscopy (N/A, 08/01/2013); Cardiac catheterization (2005); Esophagogastroduodenoscopy (N/A, 04/23/2015); RIGHT/LEFT HEART CATH AND CORONARY ANGIOGRAPHY (N/A, 06/03/2016); RIGHT/LEFT HEART CATH AND CORONARY ANGIOGRAPHY (N/A, 03/19/2018); CORONARY STENT INTERVENTION (N/A, 03/19/2018); and SUBQ ICD IMPLANT (N/A, 05/27/2019).   His  family history includes Alcohol abuse in his brother; Diabetes in his father; Hypertension in his father; Lung cancer in his mother.Brad Mendoza reports that Brad Mendoza has never smoked. His smokeless tobacco use includes snuff. Brad Mendoza reports current alcohol use of about 1.0 standard drink of alcohol per week. Brad Mendoza reports current drug use. Drug: Marijuana.    ROS Review of Systems  Constitutional: Negative for fever.  Respiratory: Negative for shortness of breath.   Cardiovascular: Negative for chest pain.  Musculoskeletal: Negative for arthralgias.  Skin: Negative for rash.    Objective:  BP 131/82   Pulse 79   Temp 98.2 F (36.8 C) (Temporal)   Ht 5' 9"  (1.753 m)   Wt 215 lb (97.5 kg)   BMI 31.75 kg/m   BP Readings from Last 3 Encounters:  12/09/19 131/82  11/06/19 130/88  10/08/19 132/76    Wt Readings from Last 3 Encounters:  12/09/19 215 lb (97.5 kg)  11/06/19 210 lb (95.3 kg)  10/08/19 202 lb (91.6 kg)     Physical Exam Vitals reviewed.  Constitutional:      Appearance: Brad Mendoza is well-developed.  HENT:     Head: Normocephalic and atraumatic.     Right Ear: External ear normal.     Left Ear: External ear normal.     Mouth/Throat:     Pharynx: No oropharyngeal exudate or posterior oropharyngeal erythema.  Eyes:     Pupils: Pupils are equal, round, and reactive to light.  Cardiovascular:     Rate and Rhythm: Normal rate and regular rhythm.     Heart sounds: No murmur heard.   Pulmonary:     Effort: No respiratory distress.     Breath sounds: Normal breath sounds.  Musculoskeletal:     Cervical back: Normal range of motion and neck supple.  Neurological:     Mental Status: Brad Mendoza is alert and oriented to person, place, and time.    X-ray shows: That there is significant degenerative disc disease and thoracic spine.   Assessment & Plan:   Brad Mendoza was seen today for follow-up.  Diagnoses and all orders for this visit:  Type 2 diabetes mellitus with diabetic polyneuropathy, with  long-term current use of insulin (HCC) -     CBC with Differential/Platelet -     CMP14+EGFR -     Lipid panel -     Bayer DCA Hb A1c Waived  Chronic kidney disease, stage IV (severe) (HCC) -     CBC with Differential/Platelet -     CMP14+EGFR -     Lipid panel  Essential hypertension -     CBC with Differential/Platelet -     CMP14+EGFR -     Lipid panel  Pure hypercholesterolemia -     CBC with Differential/Platelet -     CMP14+EGFR -     Lipid panel  Vitamin D deficiency -     VITAMIN D 25 Hydroxy (Vit-D Deficiency, Fractures)  Coronary artery disease involving native coronary artery of native heart with angina pectoris (HCC)  Chronic combined systolic and diastolic heart failure (HCC) -     DG Chest 2 View; Future  Other chest pain -     DG Chest 2 View; Future  Acute bilateral thoracic back pain -     DG Thoracic Spine 2 View; Future  Other orders -     Semaglutide, 1 MG/DOSE, (OZEMPIC, 1 MG/DOSE,) 2 MG/1.5ML SOPN; Inject 1 mg into the skin once a week.       I have discontinued Brad Mendoza's Ozempic (0.25 or 0.5 MG/DOSE). I am also having him maintain his aspirin EC, Multiple Vitamins-Minerals (CENTRUM SILVER ADULT 50+ PO), Capsaicin (ICY HOT ARTHRITIS THERAPY EX), Fish Oil, Vitamin D3, terbinafine, nabumetone, fluticasone, nitroGLYCERIN, icosapent Ethyl, carvedilol, pregabalin, blood glucose meter kit and supplies, OneTouch Verio, loratadine, Toujeo Max SoloStar, ranolazine, pantoprazole, rosuvastatin, OneTouch Verio, onetouch ultrasoft, tiZANidine, FreeStyle Libre 2 Sensor, Xarelto, ProAir HFA, torsemide, hydrALAZINE, isosorbide mononitrate, potassium chloride SA, and Ozempic (1 MG/DOSE).  Allergies as of 12/09/2019      Reactions   Sulfa Antibiotics Shortness Of Breath   Penicillins Rash, Other (See Comments)   Has patient had a PCN reaction causing immediate rash, facial/tongue/throat swelling, SOB or lightheadedness with hypotension: No Has patient  had a PCN reaction causing severe rash involving mucus membranes or skin necrosis: No Has patient had a PCN reaction that required hospitalization No Has patient had a PCN reaction occurring within the last 10 years: No If all of the above answers are "NO", then may proceed with Cephalosporin use.      Medication List       Accurate as of December 09, 2019  9:27 PM. If you have any questions, ask your nurse or doctor.        aspirin EC 81 MG tablet Take 81 mg by mouth daily.   blood glucose meter kit and supplies Kit Dispense based on patient and insurance preference. Test four times daily as directed. (FOR ICD-10 : E11.9   carvedilol 6.25 MG tablet Commonly known as: COREG Take 1 tablet (6.25 mg total) by mouth 2 (two) times daily with a meal.   CENTRUM SILVER ADULT 50+ PO Take 1 tablet by mouth daily.  Fish Oil 1000 MG Cpdr Take 1,000 mg by mouth daily.   fluticasone 50 MCG/ACT nasal spray Commonly known as: FLONASE USE 2 SPRAYS IN EACH NOSTRIL DAILY   FreeStyle Libre 2 Sensor Misc Use to test blood sugar up to 6 times daily as directed. E11.9   hydrALAZINE 100 MG tablet Commonly known as: APRESOLINE TAKE (1) TABLET BY MOUTH (3) TIMES DAILY.   icosapent Ethyl 1 g capsule Commonly known as: Vascepa Take 2 capsules (2 g total) by mouth 2 (two) times daily.   ICY HOT ARTHRITIS THERAPY EX Apply 1 application topically 2 (two) times daily as needed (back pain).   isosorbide mononitrate 120 MG 24 hr tablet Commonly known as: IMDUR TAKE (1) TABLET BY MOUTH ONCE DAILY.   loratadine 10 MG tablet Commonly known as: CLARITIN Take 1 tablet (10 mg total) by mouth daily.   nabumetone 500 MG tablet Commonly known as: RELAFEN TAKE TWO TABLETS BY MOUTH TWICE DAILY   nitroGLYCERIN 0.4 MG SL tablet Commonly known as: NITROSTAT DISSOLVE 1 TAB UNDER TOUNGE FOR CHEST PAIN. MAY REPEAT EVERY 5 MINUTES FOR 3 DOSES. IF NO RELIEF CALL 911 OR GO TO ER   onetouch ultrasoft  lancets Use as instructed   DX E11.9   OneTouch Verio test strip Generic drug: glucose blood USE FOUR TIMES DAILY AS NEEDED Dx E11.9   OneTouch Verio w/Device Kit 1 Device by Does not apply route 2 (two) times daily.   Ozempic (1 MG/DOSE) 2 MG/1.5ML Sopn Generic drug: Semaglutide (1 MG/DOSE) Inject 1 mg into the skin once a week. What changed: Another medication with the same name was removed. Continue taking this medication, and follow the directions you see here. Changed by: [name], MD   pantoprazole 40 MG tablet Commonly known as: PROTONIX Take 1 tablet (40 mg total) by mouth daily.   potassium chloride SA 20 MEQ tablet Commonly known as: KLOR-CON TAKE (1) TABLET BY MOUTH ONCE DAILY.   pregabalin 200 MG capsule Commonly known as: LYRICA Take 2 capsules (400 mg total) by mouth at bedtime.   ProAir HFA 108 (90 Base) MCG/ACT inhaler Generic drug: albuterol USE 2 PUFFS EVERY 6 HOURS AS NEEDED   ranolazine 500 MG 12 hr tablet Commonly known as: RANEXA TAKE 1 TABLET BY MOUTH TWICE DAILY.   rosuvastatin 20 MG tablet Commonly known as: CRESTOR Take 20 mg by mouth daily.   terbinafine 1 % cream Commonly known as: LAMISIL Apply 1 application topically 2 (two) times daily. Apply to both feet and between toes   tiZANidine 4 MG tablet Commonly known as: ZANAFLEX Take 1 tablet (4 mg total) by mouth every 6 (six) hours as needed for muscle spasms.   torsemide 20 MG tablet Commonly known as: DEMADEX TAKE 3 TABLETS BY MOUTH DAILY.   Toujeo Max SoloStar 300 UNIT/ML Solostar Pen Generic drug: insulin glargine (2 Unit Dial) Inject 50 units each morning and 40 units each evening   Vitamin D3 25 MCG (1000 UT) Caps Take 2,000 Units by mouth daily.   Xarelto 2.5 MG Tabs tablet Generic drug: rivaroxaban TAKE (1) TABLET BY MOUTH TWICE DAILY.        Follow-up: Return in about 3 months (around 03/10/2020).  [name], M.D.

## 2019-12-10 ENCOUNTER — Other Ambulatory Visit: Payer: Self-pay | Admitting: Family Medicine

## 2019-12-10 LAB — CMP14+EGFR
ALT: 31 IU/L (ref 0–44)
AST: 33 IU/L (ref 0–40)
Albumin/Globulin Ratio: 1.5 (ref 1.2–2.2)
Albumin: 3.2 g/dL — ABNORMAL LOW (ref 3.8–4.8)
Alkaline Phosphatase: 108 IU/L (ref 44–121)
BUN/Creatinine Ratio: 23 (ref 10–24)
BUN: 58 mg/dL — ABNORMAL HIGH (ref 8–27)
Bilirubin Total: <0.2 mg/dL (ref 0.0–1.2)
CO2: 23 mmol/L (ref 20–29)
Calcium: 8.5 mg/dL — ABNORMAL LOW (ref 8.6–10.2)
Chloride: 109 mmol/L — ABNORMAL HIGH (ref 96–106)
Creatinine, Ser: 2.54 mg/dL — ABNORMAL HIGH (ref 0.76–1.27)
GFR calc Af Amer: 30 mL/min/{1.73_m2} — ABNORMAL LOW (ref >59)
GFR calc non Af Amer: 26 mL/min/{1.73_m2} — ABNORMAL LOW (ref >59)
Globulin, Total: 2.1 g/dL (ref 1.5–4.5)
Glucose: 75 mg/dL (ref 65–99)
Potassium: 5.1 mmol/L (ref 3.5–5.2)
Sodium: 143 mmol/L (ref 134–144)
Total Protein: 5.3 g/dL — ABNORMAL LOW (ref 6.0–8.5)

## 2019-12-10 LAB — CBC WITH DIFFERENTIAL/PLATELET
Basophils Absolute: 0.1 10*3/uL (ref 0.0–0.2)
Basos: 1 %
EOS (ABSOLUTE): 0.2 10*3/uL (ref 0.0–0.4)
Eos: 4 %
Hematocrit: 35.1 % — ABNORMAL LOW (ref 37.5–51.0)
Hemoglobin: 11.2 g/dL — ABNORMAL LOW (ref 13.0–17.7)
Immature Grans (Abs): 0 10*3/uL (ref 0.0–0.1)
Immature Granulocytes: 1 %
Lymphocytes Absolute: 1 10*3/uL (ref 0.7–3.1)
Lymphs: 17 %
MCH: 26.7 pg (ref 26.6–33.0)
MCHC: 31.9 g/dL (ref 31.5–35.7)
MCV: 84 fL (ref 79–97)
Monocytes Absolute: 0.5 10*3/uL (ref 0.1–0.9)
Monocytes: 10 %
Neutrophils Absolute: 3.8 10*3/uL (ref 1.4–7.0)
Neutrophils: 67 %
Platelets: 284 10*3/uL (ref 150–450)
RBC: 4.2 x10E6/uL (ref 4.14–5.80)
RDW: 14.6 % (ref 11.6–15.4)
WBC: 5.6 10*3/uL (ref 3.4–10.8)

## 2019-12-10 LAB — LIPID PANEL
Chol/HDL Ratio: 3.2 ratio (ref 0.0–5.0)
Cholesterol, Total: 173 mg/dL (ref 100–199)
HDL: 54 mg/dL (ref >39)
LDL Chol Calc (NIH): 99 mg/dL (ref 0–99)
Triglycerides: 113 mg/dL (ref 0–149)
VLDL Cholesterol Cal: 20 mg/dL (ref 5–40)

## 2019-12-10 LAB — VITAMIN D 25 HYDROXY (VIT D DEFICIENCY, FRACTURES): Vit D, 25-Hydroxy: 22.6 ng/mL — ABNORMAL LOW (ref 30.0–100.0)

## 2019-12-10 MED ORDER — VITAMIN D (ERGOCALCIFEROL) 1.25 MG (50000 UNIT) PO CAPS: 50000.0000 [IU] | ORAL_CAPSULE | ORAL | 3 refills | Status: AC

## 2019-12-11 ENCOUNTER — Encounter (INDEPENDENT_AMBULATORY_CARE_PROVIDER_SITE_OTHER): Payer: Medicare Other | Admitting: Ophthalmology

## 2019-12-12 ENCOUNTER — Telehealth: Payer: Self-pay

## 2019-12-12 NOTE — Telephone Encounter (Signed)
Please advise if you have patient's sleep study results.

## 2019-12-12 NOTE — Telephone Encounter (Signed)
Patient aware.

## 2019-12-12 NOTE — Telephone Encounter (Signed)
I do not.

## 2019-12-18 ENCOUNTER — Encounter (INDEPENDENT_AMBULATORY_CARE_PROVIDER_SITE_OTHER): Payer: Medicare Other | Admitting: Ophthalmology

## 2019-12-23 ENCOUNTER — Other Ambulatory Visit: Payer: Self-pay | Admitting: Family Medicine

## 2019-12-23 DIAGNOSIS — I10 Essential (primary) hypertension: Secondary | ICD-10-CM

## 2019-12-25 DIAGNOSIS — D638 Anemia in other chronic diseases classified elsewhere: Secondary | ICD-10-CM | POA: Diagnosis not present

## 2019-12-25 DIAGNOSIS — I129 Hypertensive chronic kidney disease with stage 1 through stage 4 chronic kidney disease, or unspecified chronic kidney disease: Secondary | ICD-10-CM | POA: Diagnosis not present

## 2019-12-25 DIAGNOSIS — N189 Chronic kidney disease, unspecified: Secondary | ICD-10-CM | POA: Diagnosis not present

## 2019-12-25 DIAGNOSIS — R809 Proteinuria, unspecified: Secondary | ICD-10-CM | POA: Diagnosis not present

## 2019-12-25 DIAGNOSIS — I5043 Acute on chronic combined systolic (congestive) and diastolic (congestive) heart failure: Secondary | ICD-10-CM | POA: Diagnosis not present

## 2019-12-30 ENCOUNTER — Encounter (INDEPENDENT_AMBULATORY_CARE_PROVIDER_SITE_OTHER): Payer: Medicare Other | Admitting: Ophthalmology

## 2019-12-30 DIAGNOSIS — E113313 Type 2 diabetes mellitus with moderate nonproliferative diabetic retinopathy with macular edema, bilateral: Secondary | ICD-10-CM

## 2019-12-30 DIAGNOSIS — H3581 Retinal edema: Secondary | ICD-10-CM

## 2019-12-30 DIAGNOSIS — H25813 Combined forms of age-related cataract, bilateral: Secondary | ICD-10-CM

## 2019-12-30 DIAGNOSIS — H35033 Hypertensive retinopathy, bilateral: Secondary | ICD-10-CM

## 2019-12-30 DIAGNOSIS — I10 Essential (primary) hypertension: Secondary | ICD-10-CM

## 2020-01-07 NOTE — Progress Notes (Deleted)
Cardiology Office Note  Date: 01/07/2020   ID: Brad Mendoza, DOB 1956/02/06, MRN [id number]  PCP:  [name], MD  Cardiologist:  Rozann Lesches, MD Electrophysiologist:  None   No chief complaint on file.   History of Present Illness: Brad Mendoza is a 64 y.o. male last seen in August.  He sees Dr. Lovena Le, [place] ICD in place.  Past Medical History:  Diagnosis Date  . CAD (coronary artery disease)    DES to RCA January 2020  . Chronic back pain   . CKD (chronic kidney disease), stage IV ([place])   . Diverticulosis   . Essential hypertension   . GERD (gastroesophageal reflux disease)   . Hyperlipidemia   . ICD (implantable cardioverter-defibrillator) in place    [facility] [employer] - Dr. Lovena Le, implanted April 2021  . Onychomycosis   . Polysubstance abuse ([place])   . 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 Type 2 diabetes mellitus ([place])     Past Surgical History:  Procedure Laterality Date  . APPENDECTOMY    . CARDIAC CATHETERIZATION  2005   4 frech cath  . COLONOSCOPY N/A 08/01/2013   Procedure: COLONOSCOPY;  Surgeon: Rogene Houston, MD;  [facility];  Service: Endoscopy;  Laterality: N/A;  rescheduled to [place] notified pt  . CORONARY STENT INTERVENTION N/A 03/19/2018   Procedure: CORONARY STENT INTERVENTION;  Surgeon: Leonie Man, MD;  Location: [facility];  Service: Cardiovascular;  Laterality: N/A;  . ESOPHAGOGASTRODUODENOSCOPY N/A 04/23/2015   Procedure: ESOPHAGOGASTRODUODENOSCOPY (EGD);  Surgeon: Rogene Houston, MD;  [facility];  Service: Endoscopy;  Laterality: N/A;  1:25  . RIGHT/LEFT HEART CATH AND CORONARY ANGIOGRAPHY N/A 06/03/2016   Procedure: Right/Left Heart Cath and Coronary Angiography;  Surgeon: Belva Crome, MD;  Location: [place];  Service: Cardiovascular;  Laterality: N/A;  . RIGHT/LEFT HEART CATH AND CORONARY ANGIOGRAPHY N/A 03/19/2018   Procedure:  RIGHT/LEFT HEART CATH AND CORONARY ANGIOGRAPHY;  Surgeon: Jolaine Artist, MD;  Location: [place] [facility];  Service: Cardiovascular;  Laterality: N/A;  . SUBQ ICD IMPLANT N/A 05/27/2019   Procedure: SUBQ ICD IMPLANT;  Surgeon: Evans Lance, MD;  Location: [facility];  Service: Cardiovascular;  Laterality: N/A;    Current Outpatient Medications  Medication Sig Dispense Refill  . aspirin EC 81 MG tablet Take 81 mg by mouth daily.    . blood glucose meter kit and supplies KIT Dispense based on patient and insurance preference. Test four times daily as directed. (FOR ICD-10 : E11.9 1 each 0  . Blood Glucose Monitoring Suppl (ONETOUCH VERIO) w/Device KIT 1 Device by Does not apply route 2 (two) times daily. 1 kit 0  . Capsaicin (ICY HOT ARTHRITIS THERAPY EX) Apply 1 application topically 2 (two) times daily as needed (back pain).     . carvedilol (COREG) 6.25 MG tablet Take 1 tablet (6.25 mg total) by mouth 2 (two) times daily with a meal. 180 tablet 1  . Cholecalciferol (VITAMIN D3) 25 MCG (1000 UT) CAPS Take 2,000 Units by mouth daily.    . Continuous Blood Gluc Sensor (FREESTYLE LIBRE 2 SENSOR) MISC Use to test blood sugar up to 6 times daily as directed. E11.9 1 each 10  . fluticasone (FLONASE) 50 MCG/ACT nasal spray USE 2 SPRAYS IN EACH NOSTRIL DAILY 48 g 1  . glucose blood (ONETOUCH VERIO) test strip USE FOUR TIMES DAILY  AS NEEDED Dx E11.9 100 each 11  . hydrALAZINE (APRESOLINE) 100 MG tablet TAKE (1) TABLET BY MOUTH (3) TIMES DAILY. 90 tablet 5  . icosapent Ethyl (VASCEPA) 1 g capsule Take 2 capsules (2 g total) by mouth 2 (two) times daily. 360 capsule 1  . insulin glargine, 2 Unit Dial, (TOUJEO MAX SOLOSTAR) 300 UNIT/ML Solostar Pen Inject 50 units each morning and 40 units each evening 33 mL 2  . isosorbide mononitrate (IMDUR) 120 MG 24 hr tablet TAKE (1) TABLET BY MOUTH ONCE DAILY. 30 tablet 5  . Lancets (ONETOUCH ULTRASOFT) lancets Use as instructed   DX E11.9 100 each 12   . loratadine (CLARITIN) 10 MG tablet Take 1 tablet (10 mg total) by mouth daily. 30 tablet 11  . Multiple Vitamins-Minerals (CENTRUM SILVER ADULT 50+ PO) Take 1 tablet by mouth daily.    . nabumetone (RELAFEN) 500 MG tablet TAKE TWO TABLETS BY MOUTH TWICE DAILY 360 tablet 1  . nitroGLYCERIN (NITROSTAT) 0.4 MG SL tablet DISSOLVE 1 TAB UNDER TOUNGE FOR CHEST PAIN. MAY REPEAT EVERY 5 MINUTES FOR 3 DOSES. IF NO RELIEF CALL 911 OR GO TO ER 25 tablet 2  . Omega-3 Fatty Acids (FISH OIL) 1000 MG CPDR Take 1,000 mg by mouth daily.     . pantoprazole (PROTONIX) 40 MG tablet Take 1 tablet (40 mg total) by mouth daily. 60 tablet 5  . potassium chloride SA (KLOR-CON) 20 MEQ tablet TAKE (1) TABLET BY MOUTH ONCE DAILY. 30 tablet 5  . pregabalin (LYRICA) 200 MG capsule Take 2 capsules (400 mg total) by mouth at bedtime. 60 capsule 2  . PROAIR HFA 108 (90 Base) MCG/ACT inhaler USE 2 PUFFS EVERY 6 HOURS AS NEEDED 8.5 g 3  . ranolazine (RANEXA) 500 MG 12 hr tablet TAKE 1 TABLET BY MOUTH TWICE DAILY. 180 tablet 3  . rosuvastatin (CRESTOR) 10 MG tablet TAKE 1 TABLET BY MOUTH ONCE A DAY. 30 tablet 5  . rosuvastatin (CRESTOR) 20 MG tablet Take 20 mg by mouth daily.    . Semaglutide, 1 MG/DOSE, (OZEMPIC, 1 MG/DOSE,) 2 MG/1.5ML SOPN Inject 1 mg into the skin once a week. 3 mL 2  . terbinafine (LAMISIL) 1 % cream Apply 1 application topically 2 (two) times daily. Apply to both feet and between toes 30 g 1  . tiZANidine (ZANAFLEX) 4 MG tablet Take 1 tablet (4 mg total) by mouth every 6 (six) hours as needed for muscle spasms. 60 tablet 5  . torsemide (DEMADEX) 20 MG tablet TAKE 3 TABLETS BY MOUTH DAILY. 90 tablet 5  . Vitamin D, Ergocalciferol, (DRISDOL) 1.25 MG (50000 UNIT) CAPS capsule Take 1 capsule (50,000 Units total) by mouth every 7 (seven) days. 13 capsule 3  . XARELTO 2.5 MG TABS tablet TAKE (1) TABLET BY MOUTH TWICE DAILY. 60 tablet 11   No current facility-administered medications for this visit.   Allergies:   Sulfa antibiotics and Penicillins   Social History: The patient  reports that he has never smoked. His smokeless tobacco use includes snuff. He reports current alcohol use of about 1.0 standard drink of alcohol per week. He reports current drug use. Drug: Marijuana.   Family History: The patient's family history includes Alcohol abuse in his brother; Diabetes in his father; Hypertension in his father; Lung cancer in his mother.   ROS:  Please see the history of present illness. Otherwise, complete review of systems is positive for {NONE DEFAULTED:18576::"none"}.  All other systems are reviewed and negative.  Physical Exam: VS:  There were no vitals taken for this visit., BMI There is no height or weight on file to calculate BMI.  Wt Readings from Last 3 Encounters:  12/09/19 215 lb (97.5 kg)  11/06/19 210 lb (95.3 kg)  10/08/19 202 lb (91.6 kg)    General: Patient appears comfortable at rest. HEENT: Conjunctiva and lids normal, oropharynx clear with moist mucosa. Neck: Supple, no elevated JVP or carotid bruits, no thyromegaly. Lungs: Clear to auscultation, nonlabored breathing at rest. Cardiac: Regular rate and rhythm, no S3 or significant systolic murmur, no pericardial rub. Abdomen: Soft, nontender, no hepatomegaly, bowel sounds present, no guarding or rebound. Extremities: No pitting edema, distal pulses 2+. Skin: Warm and dry. Musculoskeletal: No kyphosis. Neuropsychiatric: Alert and oriented x3, affect grossly appropriate.  ECG:  An ECG dated 05/27/2019 was personally reviewed today and demonstrated:  Sinus rhythm with nonspecific T wave changes.  Recent Labwork: 04/02/2019: TSH 1.969 12/09/2019: ALT 31; AST 33; BUN 58; Creatinine, Ser 2.54; Hemoglobin 11.2; Platelets 284; Potassium 5.1; Sodium 143     Component Value Date/Time   CHOL 173 12/09/2019 1348   CHOL 277 (H) 07/11/2012 1308   TRIG 113 12/09/2019 1348   TRIG 658 (HH) 12/16/2013 1459   TRIG 522 (H) 07/11/2012 1308    HDL 54 12/09/2019 1348   HDL 42 12/16/2013 1459   HDL 39 (L) 07/11/2012 1308   CHOLHDL 3.2 12/09/2019 1348   CHOLHDL 8.2 01/22/2019 1600   VLDL UNABLE TO CALCULATE IF TRIGLYCERIDE OVER 400 mg/dL 01/22/2019 1600   LDLCALC 99 12/09/2019 1348   LDLCALC 144 (H) 05/10/2013 1225   LDLCALC 134 (H) 07/11/2012 1308   LDLDIRECT 135.2 (H) 01/22/2019 1600    Other Studies Reviewed Today:  Cardiac catheterization 03/19/2018:  Ost 1st Diag lesion is 95% stenosed.  Prox LAD to Mid LAD lesion is 60% stenosed.  2nd Diag lesion is 70% stenosed.  Mid Cx to Dist Cx lesion is 30% stenosed.  Mid RCA lesion is 70% stenosed.  Prox RCA lesion is 90% stenosed.  Findings:  Ao = 153/81 (111) LV = 144/30 RA = 11 RV = 77/12 PA = 76/30 (50) PCW = 29 Fick cardiac output/index = 6.8/3.1 PVR = 3.0 WU Ao sat = 95% PA sat = 62%, 65%  Assessment: 1. 2v CAD with high-grade lesion in proximal RCA and first diagonal 2. The RCA lesion has progressed from previous.  3. There is stable moderate CAD in LAD 4. EF 30-35% by echo with elevated filling pressures 5. Moderate mixed pulmonary HTN  PCI 03/19/2018:  Prox RCA lesion is 90% stenosed.  A drug-eluting stent was successfully placed using a STENT SYNERGY DES [id number]. Postdilated to 2.8 mm  Post intervention, there is a 0% residual stenosis.  Mid RCA lesion is 70% stenosed -stable from prior cath. Will be treated medically.  SUMMARY   Successful DES PCI to proximal RCA lesion with a Synergy DES 2.5 mg 16 mm postdilated to 2.8 mm.  Echocardiogram 01/22/2019: 1. Left ventricular ejection fraction, by visual estimation, is 30 to  35%. The left ventricle has moderate to severely decreased function. Left  ventricular septal wall thickness was mildly increased. Mildly increased  left ventricular posterior wall  thickness. There is mildly increased left ventricular hypertrophy.  2. Elevated left ventricular end-diastolic pressure.  3. Left  ventricular diastolic parameters are consistent with Grade I  diastolic dysfunction (impaired relaxation).  4. The left ventricle demonstrates global hypokinesis.  5. Global right ventricle has normal  systolic function.The right  ventricular size is normal. No increase in right ventricular wall  thickness.  6. Left atrial size was normal.  7. Right atrial size was normal.  8. The mitral valve is normal in structure. No evidence of mitral valve  regurgitation. No evidence of mitral stenosis.  9. The tricuspid valve is normal in structure. Tricuspid valve  regurgitation is mild.  10. The aortic valve is tricuspid. Aortic valve regurgitation is not  visualized. No evidence of aortic valve sclerosis or stenosis.  11. The pulmonic valve was normal in structure. Pulmonic valve  regurgitation is not visualized.  12. Moderately elevated pulmonary artery systolic pressure.  13. The inferior vena cava is normal in size with greater than 50%  respiratory variability, suggesting right atrial pressure of 3 mmHg.   Assessment and Plan:   Medication Adjustments/Labs and Tests Ordered: Current medicines are reviewed at length with the patient today.  Concerns regarding medicines are outlined above.   Tests Ordered: No orders of the defined types were placed in this encounter.   Medication Changes: No orders of the defined types were placed in this encounter.   Disposition:  Follow up {follow up:15908}  Signed, Satira Sark, MD, [facility] 01/07/2020 2:56 PM    [facility] at [facility] [address] [place], [place] [address] Phone: [phone]; Fax: [phone]

## 2020-01-08 ENCOUNTER — Ambulatory Visit: Payer: Medicare Other | Admitting: Cardiology

## 2020-01-08 DIAGNOSIS — I25119 Atherosclerotic heart disease of native coronary artery with unspecified angina pectoris: Secondary | ICD-10-CM

## 2020-01-08 DIAGNOSIS — N184 Chronic kidney disease, stage 4 (severe): Secondary | ICD-10-CM

## 2020-01-08 DIAGNOSIS — I5042 Chronic combined systolic (congestive) and diastolic (congestive) heart failure: Secondary | ICD-10-CM

## 2020-01-13 NOTE — Progress Notes (Signed)
Cardiology Office Note    Date:  01/21/2020   ID:  LOT MEDFORD, DOB Jun 22, 1955, MRN [id number]  PCP:  [name], MD  Cardiologist: Rozann Lesches, MD EPS: None  Chief Complaint  Patient presents with   Leg Swelling   Shortness of Breath    History of Present Illness:  TIMOTY BOURKE is a 65 y.o. male with history of CAD status post DES to the proximal RCA 02/2018 on Xarelto 2.5 mg twice daily as part of the [facility] protocol, chronic systolic CHF LVEF 30 to 31% on echo 01/2019, hyperlipidemia, CKD stage IV, ICD followed by Dr. Lovena Le.  Patient comes in today for f/u. Dr. [name] increased his demadex to 100 mg daily 12/25/19 and he ran out yesterday. Has an appt Friday. His note says weight was 216 lbs but today is 228. Got too much salt over Thanksgiving-ham, [place] at all the sides. Walks 1 mile/day. De Has increased leg swelling and dyspnea on exertion and orthopnea. uses his inhaler at night. Denies chest pain. Chews tobacco.    Past Medical History:  Diagnosis Date   CAD (coronary artery disease)    DES to RCA January 2020   Chronic back pain    CKD (chronic kidney disease), stage IV (HCC)    Diverticulosis    Essential hypertension    GERD (gastroesophageal reflux disease)    Hyperlipidemia    ICD (implantable cardioverter-defibrillator) in place    [facility] - Dr. 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   Type 2 diabetes mellitus [facility])     Past Surgical History:  Procedure Laterality Date   APPENDECTOMY     CARDIAC CATHETERIZATION  2005   4 frech cath   COLONOSCOPY N/A 08/01/2013   Procedure: COLONOSCOPY;  Surgeon: Rogene Houston, MD;  [facility];  Service: Endoscopy;  Laterality: N/A;  rescheduled to [place] notified pt   CORONARY STENT INTERVENTION N/A 03/19/2018   Procedure: CORONARY STENT INTERVENTION;   Surgeon: Leonie Man, MD;  Location: [place] [facility];  Service: Cardiovascular;  Laterality: N/A;   ESOPHAGOGASTRODUODENOSCOPY N/A 04/23/2015   Procedure: ESOPHAGOGASTRODUODENOSCOPY (EGD);  Surgeon: Rogene Houston, MD;  [facility];  Service: Endoscopy;  Laterality: N/A;  1:25   RIGHT/LEFT HEART CATH AND CORONARY ANGIOGRAPHY N/A 06/03/2016   Procedure: Right/Left Heart Cath and Coronary Angiography;  Surgeon: Belva Crome, MD;  Location: [place] [facility];  Service: Cardiovascular;  Laterality: N/A;   RIGHT/LEFT HEART CATH AND CORONARY ANGIOGRAPHY N/A 03/19/2018   Procedure: RIGHT/LEFT HEART CATH AND CORONARY ANGIOGRAPHY;  Surgeon: Jolaine Artist, MD;  Location: [place] [facility];  Service: Cardiovascular;  Laterality: N/A;   SUBQ ICD IMPLANT N/A 05/27/2019   Procedure: SUBQ ICD IMPLANT;  Surgeon: Evans Lance, MD;  Location: [place] [facility];  Service: Cardiovascular;  Laterality: N/A;    Current Medications: Current Meds  Medication Sig   aspirin EC 81 MG tablet Take 81 mg by mouth daily.   blood glucose meter kit and supplies KIT Dispense based on patient and insurance preference. Test four times daily as directed. (FOR ICD-10 : E11.9   Blood Glucose Monitoring Suppl (ONETOUCH VERIO) w/Device KIT 1 Device by Does not apply route 2 (two) times daily.   Capsaicin (ICY HOT ARTHRITIS THERAPY EX) Apply 1 application topically 2 (two) times daily as  needed (back pain).    carvedilol (COREG) 6.25 MG tablet Take 1 tablet (6.25 mg total) by mouth 2 (two) times daily with a meal.   Cholecalciferol (VITAMIN D3) 25 MCG (1000 UT) CAPS Take 2,000 Units by mouth daily.   Continuous Blood Gluc Sensor (FREESTYLE LIBRE 2 SENSOR) MISC Use to test blood sugar up to 6 times daily as directed. E11.9   fluticasone (FLONASE) 50 MCG/ACT nasal spray INSTILL 2 SPRAYS INTO EACH NOSTRIL DAILY.   glucose blood (ONETOUCH VERIO) test strip USE FOUR TIMES DAILY AS NEEDED Dx E11.9    hydrALAZINE (APRESOLINE) 100 MG tablet TAKE (1) TABLET BY MOUTH (3) TIMES DAILY.   icosapent Ethyl (VASCEPA) 1 g capsule Take 2 capsules (2 g total) by mouth 2 (two) times daily.   insulin glargine, 2 Unit Dial, (TOUJEO MAX SOLOSTAR) 300 UNIT/ML Solostar Pen Inject 50 units each morning and 40 units each evening   isosorbide mononitrate (IMDUR) 120 MG 24 hr tablet TAKE (1) TABLET BY MOUTH ONCE DAILY.   Lancets (ONETOUCH ULTRASOFT) lancets Use as instructed   DX E11.9   loratadine (CLARITIN) 10 MG tablet Take 1 tablet (10 mg total) by mouth daily.   Multiple Vitamins-Minerals (CENTRUM SILVER ADULT 50+ PO) Take 1 tablet by mouth daily.   nabumetone (RELAFEN) 500 MG tablet TAKE TWO TABLETS BY MOUTH TWICE DAILY   nitroGLYCERIN (NITROSTAT) 0.4 MG SL tablet DISSOLVE 1 TAB UNDER TOUNGE FOR CHEST PAIN. MAY REPEAT EVERY 5 MINUTES FOR 3 DOSES. IF NO RELIEF CALL 911 OR GO TO ER   Omega-3 Fatty Acids (FISH OIL) 1000 MG CPDR Take 1,000 mg by mouth daily.    pantoprazole (PROTONIX) 40 MG tablet Take 1 tablet (40 mg total) by mouth daily.   potassium chloride SA (KLOR-CON) 20 MEQ tablet TAKE (1) TABLET BY MOUTH ONCE DAILY.   pregabalin (LYRICA) 200 MG capsule Take 2 capsules (400 mg total) by mouth at bedtime.   PROAIR HFA 108 (90 Base) MCG/ACT inhaler USE 2 PUFFS EVERY 6 HOURS AS NEEDED   ranolazine (RANEXA) 500 MG 12 hr tablet TAKE 1 TABLET BY MOUTH TWICE DAILY.   rosuvastatin (CRESTOR) 10 MG tablet TAKE 1 TABLET BY MOUTH ONCE A DAY.   Semaglutide, 1 MG/DOSE, (OZEMPIC, 1 MG/DOSE,) 2 MG/1.5ML SOPN Inject 1 mg into the skin once a week.   terbinafine (LAMISIL) 1 % cream Apply 1 application topically 2 (two) times daily. Apply to both feet and between toes   tiZANidine (ZANAFLEX) 4 MG tablet Take 1 tablet (4 mg total) by mouth every 6 (six) hours as needed for muscle spasms.   torsemide (DEMADEX) 100 MG tablet Take 1 tablet (100 mg total) by mouth daily.   Vitamin D, Ergocalciferol, (DRISDOL)  1.25 MG (50000 UNIT) CAPS capsule Take 1 capsule (50,000 Units total) by mouth every 7 (seven) days.   XARELTO 2.5 MG TABS tablet TAKE (1) TABLET BY MOUTH TWICE DAILY.   [DISCONTINUED] torsemide (DEMADEX) 20 MG tablet Take 100 mg by mouth daily.     Allergies:   Sulfa antibiotics and Penicillins   Social History   Socioeconomic History   Marital status: Legally Separated    Spouse name: Not on file   Number of children: 2   Years of education: 4   Highest education level: 4th grade  Occupational History   Occupation: disabled  Tobacco Use   Smoking status: Never Smoker   Smokeless tobacco: Current User    Types: Snuff   Tobacco comment: dips snuff about  6 cans/week for 3 years  Vaping Use   Vaping Use: Never used  Substance and Sexual Activity   Alcohol use: Yes    Alcohol/week: 1.0 standard drink    Types: 1 Cans of beer per week    Comment: occ - has cut down, now once every 2-3 months   Drug use: Yes    Types: Marijuana    Comment: occasional use - smoking less   Sexual activity: Not on file  Other Topics Concern   Not on file  Social History Narrative   Lives in [place] with spouse   Disabled.   Social Determinants of Health   Financial Resource Strain:    Difficulty of Paying Living Expenses: Not on file  Food Insecurity:    Worried About Charity fundraiser in the Last Year: Not on file   [employer] of Food in the Last Year: Not on file  Transportation Needs: Unmet Transportation Needs   Lack of Transportation (Medical): Yes   Lack of Transportation (Non-Medical): No  Physical Activity: Inactive   Days of Exercise per Week: 0 days   Minutes of Exercise per Session: 0 min  Stress:    Feeling of Stress : Not on file  Social Connections: Unknown   Frequency of Communication with Friends and Family: More than three times a week   Frequency of Social Gatherings with Friends and Family: More than three times a week   Attends Religious  Services: 1 to 4 times per year   Active Member of [employer] or Organizations: No   Attends Archivist Meetings: Never   Marital Status: Patient refused     Family History:  The patient's family history includes Alcohol abuse in his brother; Diabetes in his father; Hypertension in his father; Lung cancer in his mother.   ROS:   Please see the history of present illness.    ROS All other systems reviewed and are negative.   PHYSICAL EXAM:   VS:  BP 132/70    Pulse 88    Ht $R'5\' 9"'EF$  (1.753 m)    Wt 228 lb (103.4 kg)    SpO2 95%    BMI 33.67 kg/m   Physical Exam  GEN: Obese, in no acute distress  Neck: increased JVD, no carotid bruits, or masses Cardiac:RRR; no murmurs, rubs, or gallops  Respiratory:  Crackles at the bases GI: soft, nontender, nondistended, + BS LPF:XTKW 2-3 edema bilaterally Neuro:  Alert and Oriented x 3 Psych: euthymic mood, full affect  Wt Readings from Last 3 Encounters:  01/21/20 228 lb (103.4 kg)  12/09/19 215 lb (97.5 kg)  11/06/19 210 lb (95.3 kg)      Studies/Labs Reviewed:   EKG:  EKG is not ordered today.    Recent Labs: 04/02/2019: TSH 1.969 12/09/2019: ALT 31; BUN 58; Creatinine, Ser 2.54; Hemoglobin 11.2; Platelets 284; Potassium 5.1; Sodium 143   Lipid Panel    Component Value Date/Time   CHOL 173 12/09/2019 1348   CHOL 277 (H) 07/11/2012 1308   TRIG 113 12/09/2019 1348   TRIG 658 (HH) 12/16/2013 1459   TRIG 522 (H) 07/11/2012 1308   HDL 54 12/09/2019 1348   HDL 42 12/16/2013 1459   HDL 39 (L) 07/11/2012 1308   CHOLHDL 3.2 12/09/2019 1348   CHOLHDL 8.2 01/22/2019 1600   VLDL UNABLE TO CALCULATE IF TRIGLYCERIDE OVER 400 mg/dL 01/22/2019 1600   LDLCALC 99 12/09/2019 1348   LDLCALC 144 (H) 05/10/2013 1225   LDLCALC  134 (H) 07/11/2012 1308   LDLDIRECT 135.2 (H) 01/22/2019 1600    Additional studies/ records that were reviewed today include:  Cardiac catheterization 03/19/2018:  Ost 1st Diag lesion is 95% stenosed.  Prox  LAD to Mid LAD lesion is 60% stenosed.  2nd Diag lesion is 70% stenosed.  Mid Cx to Dist Cx lesion is 30% stenosed.  Mid RCA lesion is 70% stenosed.  Prox RCA lesion is 90% stenosed.   Findings:   Ao = 153/81 (111) LV = 144/30 RA = 11 RV = 77/12 PA = 76/30 (50) PCW = 29 Fick cardiac output/index = 6.8/3.1 PVR = 3.0 WU Ao sat = 95% PA sat = 62%, 65%   Assessment: 1. 2v CAD with high-grade lesion in proximal RCA and first diagonal 2. The RCA lesion has progressed from previous.  3. There is stable moderate CAD in LAD 4. EF 30-35% by echo with elevated filling pressures 5. Moderate mixed pulmonary HTN    PCI 03/19/2018:  Prox RCA lesion is 90% stenosed.  A drug-eluting stent was successfully placed using a STENT SYNERGY DES 2.5X16. Postdilated to 2.8 mm  Post intervention, there is a 0% residual stenosis.  Mid RCA lesion is 70% stenosed -stable from prior cath. Will be treated medically.   SUMMARY   Successful DES PCI to proximal RCA lesion with a Synergy DES 2.5 mg 16 mm postdilated to 2.8 mm.   Echocardiogram 01/22/2019: 1. Left ventricular ejection fraction, by visual estimation, is 30 to  35%. The left ventricle has moderate to severely decreased function. Left  ventricular septal wall thickness was mildly increased. Mildly increased  left ventricular posterior wall  thickness. There is mildly increased left ventricular hypertrophy.   2. Elevated left ventricular end-diastolic pressure.   3. Left ventricular diastolic parameters are consistent with Grade I  diastolic dysfunction (impaired relaxation).   4. The left ventricle demonstrates global hypokinesis.   5. Global right ventricle has normal systolic function.The right  ventricular size is normal. No increase in right ventricular wall  thickness.   6. Left atrial size was normal.   7. Right atrial size was normal.   8. The mitral valve is normal in structure. No evidence of mitral valve  regurgitation.  No evidence of mitral stenosis.   9. The tricuspid valve is normal in structure. Tricuspid valve  regurgitation is mild.  10. The aortic valve is tricuspid. Aortic valve regurgitation is not  visualized. No evidence of aortic valve sclerosis or stenosis.  11. The pulmonic valve was normal in structure. Pulmonic valve  regurgitation is not visualized.  12. Moderately elevated pulmonary artery systolic pressure.  13. The inferior vena cava is normal in size with greater than 50%  respiratory variability, suggesting right atrial pressure of 3 mmHg.        ASSESSMENT:    1. Coronary artery disease involving native coronary artery of native heart without angina pectoris   2. Chronic systolic CHF (congestive heart failure) ([place])   3. Mixed hyperlipidemia   4. Chronic kidney disease (CKD), stage IV (severe) ([place])   5. ICD (implantable cardioverter-defibrillator) in place   6. Tobacco dependence      PLAN:  In order of problems listed above:  CAD status post DES to the proximal RCA 02/2018 on aspirin and Coreg, Crestor, Ranexa, Imdur and Xarelto 2.5 mg twice daily as part of the [facility] protocol. No angina  Acute on Chronic systolic CHF Echo 38/1829 LVEF 30 to 35% on Coreg, hydralazine, Imdur,  Ranexa, Demadex. Dr. [name] increased demadex 176m daily for weight gain 207-217lbs but he ran out yest and has been getting a lot of extra salt. Weight up today 228. Will refill demadex 1067m2gm sodium diet. He's scheduled for blood work today and appt with Dr. BhTheador Hawthornen Fri. If he doesn't get some fluid off he'll have to be hospitalized for IV diuresis  Hyperlipidemia on Crestor  CKD stage IV followed by nephrology  ICD followed by Dr. TaMarge Duncanshocks  Tobacco abuse-chews tobacco. Discussed cessation  Medication Adjustments/Labs and Tests Ordered: Current medicines are reviewed at length with the patient today.  Concerns regarding medicines are outlined above.  Medication changes, Labs  and Tests ordered today are listed in the Patient Instructions below. Patient Instructions  Medication Instructions:  Your physician recommends that you continue on your current medications as directed. Please refer to the Current Medication list given to you today.  *If you need a refill on your cardiac medications before your next appointment, please call your pharmacy*   Lab Work: None If you have labs (blood work) drawn today and your tests are completely normal, you will receive your results only by:  [place] you have MyChart) OR  A paper copy in the mail If you have any lab test that is abnormal or we need to change your treatment, we will call you to review the results.   Testing/Procedures: None   Follow-Up: [facility] [place] and your health needs are our priority.  As part of our continuing mission to provide you with exceptional heart care, we have created designated Provider Care Teams.  These Care Teams include your primary Cardiologist (physician) and Advanced Practice Providers (APPs -  Physician Assistants and Nurse Practitioners) who all work together to provide you with the care you need, when you need it.  We recommend signing up for the patient portal called "MyChart".  Sign up information is provided on this After Visit Summary.  MyChart is used to connect with patients for Virtual Visits (Telemedicine).  Patients are able to view lab/test results, encounter notes, upcoming appointments, etc.  Non-urgent messages can be sent to your provider as well.   To learn more about what you can do with MyChart, go to [email or web address]   Your next appointment:   4 month(s)  The format for your next appointment:   In Person  Provider:   You may see SaRozann LeschesMD or one of the following Advanced Practice Providers on your designated Care Team:    BrBernerd PhoPA-C   MiErmalinda BarriosPA-C        Signed, MiErmalinda BarriosPAVerm[place]  01/21/2020 8:51 AM    [place]  [id number]: [phone]: [phone]

## 2020-01-17 ENCOUNTER — Ambulatory Visit: Payer: Medicare Other

## 2020-01-18 ENCOUNTER — Other Ambulatory Visit: Payer: Self-pay | Admitting: Family Medicine

## 2020-01-19 LAB — CUP PACEART REMOTE DEVICE CHECK
Battery Remaining Percentage: 94 %
Date Time Interrogation Session: 20211126130100
Implantable Lead Implant Date: 20210405
Implantable Lead Location: 753862
Implantable Lead Model: 3501
Implantable Lead Serial Number: 178864
Implantable Pulse Generator Implant Date: 20210405
Pulse Gen Serial Number: 135565

## 2020-01-21 ENCOUNTER — Other Ambulatory Visit: Payer: Medicare Other

## 2020-01-21 ENCOUNTER — Encounter: Payer: Self-pay | Admitting: Physician Assistant

## 2020-01-21 ENCOUNTER — Ambulatory Visit (INDEPENDENT_AMBULATORY_CARE_PROVIDER_SITE_OTHER): Payer: Medicare Other | Admitting: Physician Assistant

## 2020-01-21 ENCOUNTER — Other Ambulatory Visit: Payer: Self-pay

## 2020-01-21 VITALS — BP 132/70 | HR 88 | Ht 69.0 in | Wt 228.0 lb

## 2020-01-21 DIAGNOSIS — I2510 Atherosclerotic heart disease of native coronary artery without angina pectoris: Secondary | ICD-10-CM

## 2020-01-21 DIAGNOSIS — E1129 Type 2 diabetes mellitus with other diabetic kidney complication: Secondary | ICD-10-CM | POA: Diagnosis not present

## 2020-01-21 DIAGNOSIS — E782 Mixed hyperlipidemia: Secondary | ICD-10-CM

## 2020-01-21 DIAGNOSIS — I5022 Chronic systolic (congestive) heart failure: Secondary | ICD-10-CM | POA: Diagnosis not present

## 2020-01-21 DIAGNOSIS — R809 Proteinuria, unspecified: Secondary | ICD-10-CM | POA: Diagnosis not present

## 2020-01-21 DIAGNOSIS — D638 Anemia in other chronic diseases classified elsewhere: Secondary | ICD-10-CM | POA: Diagnosis not present

## 2020-01-21 DIAGNOSIS — N184 Chronic kidney disease, stage 4 (severe): Secondary | ICD-10-CM

## 2020-01-21 DIAGNOSIS — Z95810 Presence of automatic (implantable) cardiac defibrillator: Secondary | ICD-10-CM

## 2020-01-21 DIAGNOSIS — E1122 Type 2 diabetes mellitus with diabetic chronic kidney disease: Secondary | ICD-10-CM | POA: Diagnosis not present

## 2020-01-21 DIAGNOSIS — N189 Chronic kidney disease, unspecified: Secondary | ICD-10-CM | POA: Diagnosis not present

## 2020-01-21 DIAGNOSIS — F17200 Nicotine dependence, unspecified, uncomplicated: Secondary | ICD-10-CM

## 2020-01-21 MED ORDER — TORSEMIDE 100 MG PO TABS
100.0000 mg | ORAL_TABLET | Freq: Every day | ORAL | 3 refills | Status: DC
Start: 2020-01-21 — End: 2020-02-07

## 2020-01-21 NOTE — Patient Instructions (Signed)
Medication Instructions:  Your physician recommends that you continue on your current medications as directed. Please refer to the Current Medication list given to you today.  *If you need a refill on your cardiac medications before your next appointment, please call your pharmacy*   Lab Work: None If you have labs (blood work) drawn today and your tests are completely normal, you will receive your results only by:  [place] (if you have MyChart) OR  A paper copy in the mail If you have any lab test that is abnormal or we need to change your treatment, we will call you to review the results.   Testing/Procedures: None   Follow-Up: At [facility], you and your health needs are our priority.  As part of our continuing mission to provide you with exceptional heart care, we have created designated Provider Care Teams.  These Care Teams include your primary Cardiologist (physician) and Advanced Practice Providers (APPs -  Physician Assistants and Nurse Practitioners) who all work together to provide you with the care you need, when you need it.  We recommend signing up for the patient portal called "MyChart".  Sign up information is provided on this After Visit Summary.  MyChart is used to connect with patients for Virtual Visits (Telemedicine).  Patients are able to view lab/test results, encounter notes, upcoming appointments, etc.  Non-urgent messages can be sent to your provider as well.   To learn more about what you can do with MyChart, go to [email or web address].    Your next appointment:   4 month(s)  The format for your next appointment:   In Person  Provider:   You may see Rozann Lesches, MD or one of the following Advanced Practice Providers on your designated Care Team:    [place], PA-C   Ermalinda Barrios, [place]

## 2020-01-22 DIAGNOSIS — I5043 Acute on chronic combined systolic (congestive) and diastolic (congestive) heart failure: Secondary | ICD-10-CM | POA: Diagnosis not present

## 2020-01-22 DIAGNOSIS — E872 Acidosis: Secondary | ICD-10-CM | POA: Diagnosis not present

## 2020-01-22 DIAGNOSIS — R809 Proteinuria, unspecified: Secondary | ICD-10-CM | POA: Diagnosis not present

## 2020-01-22 DIAGNOSIS — N189 Chronic kidney disease, unspecified: Secondary | ICD-10-CM | POA: Diagnosis not present

## 2020-01-22 DIAGNOSIS — D638 Anemia in other chronic diseases classified elsewhere: Secondary | ICD-10-CM | POA: Diagnosis not present

## 2020-01-31 ENCOUNTER — Other Ambulatory Visit: Payer: Self-pay | Admitting: Family Medicine

## 2020-01-31 DIAGNOSIS — Z794 Long term (current) use of insulin: Secondary | ICD-10-CM

## 2020-01-31 DIAGNOSIS — E1142 Type 2 diabetes mellitus with diabetic polyneuropathy: Secondary | ICD-10-CM

## 2020-02-01 ENCOUNTER — Other Ambulatory Visit: Payer: Self-pay

## 2020-02-01 ENCOUNTER — Inpatient Hospital Stay (HOSPITAL_COMMUNITY)
Admission: EM | Admit: 2020-02-01 | Discharge: 2020-02-07 | DRG: 291 | Disposition: A | Discharge status: Home / Self Care | Payer: Medicare Other | Attending: Internal Medicine | Admitting: Internal Medicine

## 2020-02-01 ENCOUNTER — Encounter (HOSPITAL_COMMUNITY): Payer: Self-pay | Admitting: *Deleted

## 2020-02-01 ENCOUNTER — Emergency Department (HOSPITAL_COMMUNITY): Payer: Medicare Other

## 2020-02-01 DIAGNOSIS — J811 Chronic pulmonary edema: Secondary | ICD-10-CM | POA: Diagnosis not present

## 2020-02-01 DIAGNOSIS — N179 Acute kidney failure, unspecified: Secondary | ICD-10-CM | POA: Diagnosis not present

## 2020-02-01 DIAGNOSIS — Z955 Presence of coronary angioplasty implant and graft: Secondary | ICD-10-CM

## 2020-02-01 DIAGNOSIS — Z20822 Contact with and (suspected) exposure to covid-19: Secondary | ICD-10-CM | POA: Diagnosis present

## 2020-02-01 DIAGNOSIS — F17290 Nicotine dependence, other tobacco product, uncomplicated: Secondary | ICD-10-CM | POA: Diagnosis not present

## 2020-02-01 DIAGNOSIS — I25119 Atherosclerotic heart disease of native coronary artery with unspecified angina pectoris: Secondary | ICD-10-CM | POA: Diagnosis present

## 2020-02-01 DIAGNOSIS — Z811 Family history of alcohol abuse and dependence: Secondary | ICD-10-CM | POA: Diagnosis not present

## 2020-02-01 DIAGNOSIS — Z833 Family history of diabetes mellitus: Secondary | ICD-10-CM

## 2020-02-01 DIAGNOSIS — Z23 Encounter for immunization: Secondary | ICD-10-CM | POA: Diagnosis not present

## 2020-02-01 DIAGNOSIS — E1165 Type 2 diabetes mellitus with hyperglycemia: Secondary | ICD-10-CM | POA: Diagnosis not present

## 2020-02-01 DIAGNOSIS — E785 Hyperlipidemia, unspecified: Secondary | ICD-10-CM | POA: Diagnosis present

## 2020-02-01 DIAGNOSIS — M549 Dorsalgia, unspecified: Secondary | ICD-10-CM | POA: Diagnosis not present

## 2020-02-01 DIAGNOSIS — E11649 Type 2 diabetes mellitus with hypoglycemia without coma: Secondary | ICD-10-CM | POA: Diagnosis not present

## 2020-02-01 DIAGNOSIS — K219 Gastro-esophageal reflux disease without esophagitis: Secondary | ICD-10-CM | POA: Diagnosis not present

## 2020-02-01 DIAGNOSIS — Z794 Long term (current) use of insulin: Secondary | ICD-10-CM

## 2020-02-01 DIAGNOSIS — E1122 Type 2 diabetes mellitus with diabetic chronic kidney disease: Secondary | ICD-10-CM | POA: Diagnosis not present

## 2020-02-01 DIAGNOSIS — Z880 Allergy status to penicillin: Secondary | ICD-10-CM

## 2020-02-01 DIAGNOSIS — N184 Chronic kidney disease, stage 4 (severe): Secondary | ICD-10-CM | POA: Diagnosis not present

## 2020-02-01 DIAGNOSIS — I5043 Acute on chronic combined systolic (congestive) and diastolic (congestive) heart failure: Secondary | ICD-10-CM | POA: Diagnosis present

## 2020-02-01 DIAGNOSIS — Z8249 Family history of ischemic heart disease and other diseases of the circulatory system: Secondary | ICD-10-CM | POA: Diagnosis not present

## 2020-02-01 DIAGNOSIS — Z7901 Long term (current) use of anticoagulants: Secondary | ICD-10-CM

## 2020-02-01 DIAGNOSIS — R0602 Shortness of breath: Secondary | ICD-10-CM | POA: Diagnosis not present

## 2020-02-01 DIAGNOSIS — F17220 Nicotine dependence, chewing tobacco, uncomplicated: Secondary | ICD-10-CM | POA: Diagnosis present

## 2020-02-01 DIAGNOSIS — I10 Essential (primary) hypertension: Secondary | ICD-10-CM | POA: Diagnosis present

## 2020-02-01 DIAGNOSIS — I255 Ischemic cardiomyopathy: Secondary | ICD-10-CM | POA: Diagnosis not present

## 2020-02-01 DIAGNOSIS — E782 Mixed hyperlipidemia: Secondary | ICD-10-CM | POA: Diagnosis not present

## 2020-02-01 DIAGNOSIS — Z9111 Patient's noncompliance with dietary regimen: Secondary | ICD-10-CM

## 2020-02-01 DIAGNOSIS — I5031 Acute diastolic (congestive) heart failure: Secondary | ICD-10-CM

## 2020-02-01 DIAGNOSIS — I130 Hypertensive heart and chronic kidney disease with heart failure and stage 1 through stage 4 chronic kidney disease, or unspecified chronic kidney disease: Principal | ICD-10-CM | POA: Diagnosis present

## 2020-02-01 DIAGNOSIS — Z95810 Presence of automatic (implantable) cardiac defibrillator: Secondary | ICD-10-CM

## 2020-02-01 DIAGNOSIS — Z801 Family history of malignant neoplasm of trachea, bronchus and lung: Secondary | ICD-10-CM | POA: Diagnosis not present

## 2020-02-01 DIAGNOSIS — E11319 Type 2 diabetes mellitus with unspecified diabetic retinopathy without macular edema: Secondary | ICD-10-CM | POA: Diagnosis present

## 2020-02-01 DIAGNOSIS — I110 Hypertensive heart disease with heart failure: Secondary | ICD-10-CM | POA: Diagnosis not present

## 2020-02-01 DIAGNOSIS — Z9114 Patient's other noncompliance with medication regimen: Secondary | ICD-10-CM

## 2020-02-01 DIAGNOSIS — I071 Rheumatic tricuspid insufficiency: Secondary | ICD-10-CM | POA: Diagnosis present

## 2020-02-01 DIAGNOSIS — G8929 Other chronic pain: Secondary | ICD-10-CM | POA: Diagnosis not present

## 2020-02-01 DIAGNOSIS — I2510 Atherosclerotic heart disease of native coronary artery without angina pectoris: Secondary | ICD-10-CM | POA: Diagnosis present

## 2020-02-01 DIAGNOSIS — I5023 Acute on chronic systolic (congestive) heart failure: Secondary | ICD-10-CM | POA: Diagnosis not present

## 2020-02-01 DIAGNOSIS — Z882 Allergy status to sulfonamides status: Secondary | ICD-10-CM | POA: Diagnosis not present

## 2020-02-01 DIAGNOSIS — Z7982 Long term (current) use of aspirin: Secondary | ICD-10-CM

## 2020-02-01 DIAGNOSIS — E1142 Type 2 diabetes mellitus with diabetic polyneuropathy: Secondary | ICD-10-CM | POA: Diagnosis not present

## 2020-02-01 DIAGNOSIS — I429 Cardiomyopathy, unspecified: Secondary | ICD-10-CM

## 2020-02-01 DIAGNOSIS — Z79899 Other long term (current) drug therapy: Secondary | ICD-10-CM

## 2020-02-01 LAB — BASIC METABOLIC PANEL
Anion gap: 7 (ref 5–15)
BUN: 58 mg/dL — ABNORMAL HIGH (ref 8–23)
CO2: 23 mmol/L (ref 22–32)
Calcium: 8.4 mg/dL — ABNORMAL LOW (ref 8.9–10.3)
Chloride: 109 mmol/L (ref 98–111)
Creatinine, Ser: 2.61 mg/dL — ABNORMAL HIGH (ref 0.61–1.24)
GFR, Estimated: 27 mL/min — ABNORMAL LOW (ref >60)
Glucose, Bld: 199 mg/dL — ABNORMAL HIGH (ref 70–99)
Potassium: 4.4 mmol/L (ref 3.5–5.1)
Sodium: 139 mmol/L (ref 135–145)

## 2020-02-01 LAB — CBC WITH DIFFERENTIAL/PLATELET
Abs Immature Granulocytes: 0.02 10*3/uL (ref 0.00–0.07)
Basophils Absolute: 0.1 10*3/uL (ref 0.0–0.1)
Basophils Relative: 1 %
Eosinophils Absolute: 0.3 10*3/uL (ref 0.0–0.5)
Eosinophils Relative: 4 %
HCT: 33.8 % — ABNORMAL LOW (ref 39.0–52.0)
Hemoglobin: 10.4 g/dL — ABNORMAL LOW (ref 13.0–17.0)
Immature Granulocytes: 0 %
Lymphocytes Relative: 15 %
Lymphs Abs: 1 10*3/uL (ref 0.7–4.0)
MCH: 25.8 pg — ABNORMAL LOW (ref 26.0–34.0)
MCHC: 30.8 g/dL (ref 30.0–36.0)
MCV: 83.9 fL (ref 80.0–100.0)
Monocytes Absolute: 0.8 10*3/uL (ref 0.1–1.0)
Monocytes Relative: 12 %
Neutro Abs: 4.5 10*3/uL (ref 1.7–7.7)
Neutrophils Relative %: 68 %
Platelets: 284 10*3/uL (ref 150–400)
RBC: 4.03 MIL/uL — ABNORMAL LOW (ref 4.22–5.81)
RDW: 16.3 % — ABNORMAL HIGH (ref 11.5–15.5)
WBC: 6.6 10*3/uL (ref 4.0–10.5)
nRBC: 0 % (ref 0.0–0.2)

## 2020-02-01 LAB — HEPATIC FUNCTION PANEL
ALT: 31 U/L (ref 0–44)
AST: 30 U/L (ref 15–41)
Albumin: 2.5 g/dL — ABNORMAL LOW (ref 3.5–5.0)
Alkaline Phosphatase: 105 U/L (ref 38–126)
Bilirubin, Direct: <0.1 mg/dL (ref 0.0–0.2)
Total Bilirubin: 0.4 mg/dL (ref 0.3–1.2)
Total Protein: 5.6 g/dL — ABNORMAL LOW (ref 6.5–8.1)

## 2020-02-01 LAB — RESP PANEL BY RT-PCR (FLU A&B, COVID) ARPGX2
Influenza A by PCR: NEGATIVE
Influenza B by PCR: NEGATIVE
SARS Coronavirus 2 by RT PCR: NEGATIVE

## 2020-02-01 LAB — CBG MONITORING, ED: Glucose-Capillary: 146 mg/dL — ABNORMAL HIGH (ref 70–99)

## 2020-02-01 LAB — TROPONIN I (HIGH SENSITIVITY): Troponin I (High Sensitivity): 41 ng/L — ABNORMAL HIGH (ref <18)

## 2020-02-01 LAB — BRAIN NATRIURETIC PEPTIDE: B Natriuretic Peptide: 472 pg/mL — ABNORMAL HIGH (ref 0.0–100.0)

## 2020-02-01 LAB — GLUCOSE, CAPILLARY: Glucose-Capillary: 121 mg/dL — ABNORMAL HIGH (ref 70–99)

## 2020-02-01 MED ORDER — FUROSEMIDE 10 MG/ML IJ SOLN
60.0000 mg | Freq: Two times a day (BID) | INTRAMUSCULAR | Status: DC
  Administered 2020-02-02 – 2020-02-04 (×5): 60 mg via INTRAVENOUS
  Filled 2020-02-01 (×5): qty 6

## 2020-02-01 MED ORDER — ACETAMINOPHEN 325 MG PO TABS
650.0000 mg | ORAL_TABLET | ORAL | Status: DC | PRN
  Administered 2020-02-02 – 2020-02-05 (×4): 650 mg via ORAL
  Filled 2020-02-01 (×4): qty 2

## 2020-02-01 MED ORDER — SODIUM CHLORIDE 0.9 % IV SOLN: 250.0000 mL | INTRAVENOUS | Status: DC | PRN

## 2020-02-01 MED ORDER — ROSUVASTATIN CALCIUM 10 MG PO TABS
10.0000 mg | ORAL_TABLET | Freq: Every day | ORAL | Status: DC
  Administered 2020-02-02 – 2020-02-07 (×6): 10 mg via ORAL
  Filled 2020-02-01 (×6): qty 1

## 2020-02-01 MED ORDER — NABUMETONE 500 MG PO TABS
1000.0000 mg | ORAL_TABLET | Freq: Two times a day (BID) | ORAL | Status: DC
  Administered 2020-02-02 (×2): 1000 mg via ORAL
  Filled 2020-02-01 (×7): qty 2

## 2020-02-01 MED ORDER — RIVAROXABAN 2.5 MG PO TABS
2.5000 mg | ORAL_TABLET | Freq: Two times a day (BID) | ORAL | Status: DC
  Administered 2020-02-02 – 2020-02-07 (×12): 2.5 mg via ORAL
  Filled 2020-02-01 (×18): qty 1

## 2020-02-01 MED ORDER — HYDRALAZINE HCL 25 MG PO TABS
100.0000 mg | ORAL_TABLET | Freq: Three times a day (TID) | ORAL | Status: DC
  Administered 2020-02-01 – 2020-02-07 (×18): 100 mg via ORAL
  Filled 2020-02-01 (×17): qty 4

## 2020-02-01 MED ORDER — RANOLAZINE ER 500 MG PO TB12
500.0000 mg | ORAL_TABLET | Freq: Two times a day (BID) | ORAL | Status: DC
  Administered 2020-02-01 – 2020-02-07 (×12): 500 mg via ORAL
  Filled 2020-02-01 (×11): qty 1

## 2020-02-01 MED ORDER — NITROGLYCERIN 0.4 MG SL SUBL: 0.4000 mg | SUBLINGUAL_TABLET | SUBLINGUAL | Status: DC | PRN

## 2020-02-01 MED ORDER — PANTOPRAZOLE SODIUM 40 MG PO TBEC
40.0000 mg | DELAYED_RELEASE_TABLET | Freq: Every day | ORAL | Status: DC
  Administered 2020-02-02 – 2020-02-07 (×6): 40 mg via ORAL
  Filled 2020-02-01 (×6): qty 1

## 2020-02-01 MED ORDER — INSULIN ASPART 100 UNIT/ML ~~LOC~~ SOLN: 0.0000 [IU] | Freq: Every day | SUBCUTANEOUS | Status: DC

## 2020-02-01 MED ORDER — SODIUM CHLORIDE 0.9% FLUSH
3.0000 mL | INTRAVENOUS | Status: DC | PRN
  Administered 2020-02-07: 09:00:00 3 mL via INTRAVENOUS

## 2020-02-01 MED ORDER — INSULIN ASPART 100 UNIT/ML ~~LOC~~ SOLN
0.0000 [IU] | Freq: Three times a day (TID) | SUBCUTANEOUS | Status: DC
  Administered 2020-02-03: 13:00:00 3 [IU] via SUBCUTANEOUS
  Administered 2020-02-03: 18:00:00 5 [IU] via SUBCUTANEOUS
  Administered 2020-02-04: 17:00:00 2 [IU] via SUBCUTANEOUS
  Administered 2020-02-04: 12:00:00 5 [IU] via SUBCUTANEOUS

## 2020-02-01 MED ORDER — ISOSORBIDE MONONITRATE ER 60 MG PO TB24
120.0000 mg | ORAL_TABLET | Freq: Every day | ORAL | Status: DC
  Administered 2020-02-02 – 2020-02-07 (×6): 120 mg via ORAL
  Filled 2020-02-01 (×6): qty 2

## 2020-02-01 MED ORDER — ONDANSETRON HCL 4 MG/2ML IJ SOLN
4.0000 mg | Freq: Four times a day (QID) | INTRAMUSCULAR | Status: DC | PRN
  Administered 2020-02-05 (×2): 4 mg via INTRAVENOUS
  Filled 2020-02-01 (×2): qty 2

## 2020-02-01 MED ORDER — INSULIN GLARGINE 100 UNIT/ML ~~LOC~~ SOLN
30.0000 [IU] | Freq: Two times a day (BID) | SUBCUTANEOUS | Status: DC
  Administered 2020-02-02 (×2): 30 [IU] via SUBCUTANEOUS
  Filled 2020-02-01 (×5): qty 0.3

## 2020-02-01 MED ORDER — SODIUM CHLORIDE 0.9% FLUSH
3.0000 mL | Freq: Two times a day (BID) | INTRAVENOUS | Status: DC
  Administered 2020-02-01 – 2020-02-06 (×11): 3 mL via INTRAVENOUS

## 2020-02-01 MED ORDER — ICOSAPENT ETHYL 1 G PO CAPS
2.0000 g | ORAL_CAPSULE | Freq: Two times a day (BID) | ORAL | Status: DC
  Administered 2020-02-02 – 2020-02-04 (×5): 2 g via ORAL
  Filled 2020-02-01 (×13): qty 2

## 2020-02-01 MED ORDER — CARVEDILOL 3.125 MG PO TABS
6.2500 mg | ORAL_TABLET | Freq: Two times a day (BID) | ORAL | Status: DC
  Administered 2020-02-02 – 2020-02-07 (×11): 6.25 mg via ORAL
  Filled 2020-02-01 (×11): qty 2

## 2020-02-01 MED ORDER — FUROSEMIDE 10 MG/ML IJ SOLN
40.0000 mg | INTRAMUSCULAR | Status: AC
  Administered 2020-02-01: 20:00:00 40 mg via INTRAVENOUS
  Filled 2020-02-01: qty 4

## 2020-02-01 MED ORDER — ASPIRIN EC 81 MG PO TBEC
81.0000 mg | DELAYED_RELEASE_TABLET | Freq: Every day | ORAL | Status: DC
  Administered 2020-02-02 – 2020-02-07 (×6): 81 mg via ORAL
  Filled 2020-02-01 (×6): qty 1

## 2020-02-01 NOTE — ED Provider Notes (Signed)
[facility] EMERGENCY DEPARTMENT Provider Note   CSN: [id number] Arrival date & time: 02/01/20  1438     History Chief Complaint  Patient presents with  . Edema    LUSTER HECHLER is a 64 y.o. male.  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  He is a type II diabetic, has a history of an implantable cardio defibrillator and essential hypertension.  He states that he was good about taking his medications and in fact saw his nephrologist within the last couple of weeks where he was noted to have gained approximately 17 pounds, had increased amounts of diuretic given and has been taking his torsemide.  His last echocardiogram was performed in December 2020 about 1 year ago which showed an ejection fraction of 30 to 35%.  He states that despite taking the medications he has had progressive swelling of the legs, progressive orthopnea and exertional dyspnea.  Symptoms are persistent gradually worsening and have now become severe.  Past Medical History:  Diagnosis Date  . CAD (coronary artery disease)    DES to RCA January 2020  . Chronic back pain   . CKD (chronic kidney disease), stage IV ([place])   . Diverticulosis   . Essential hypertension   . GERD (gastroesophageal reflux disease)   . Hyperlipidemia   . ICD (implantable cardioverter-defibrillator) in place    [facility] - Dr. Lovena Le, implanted April 2021  . Onychomycosis   . Polysubstance abuse ([place])   . 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 Type 2 diabetes mellitus [facility])     Patient Active Problem List   Diagnosis Date Noted  . Lumbar radiculopathy 03/22/2019  . Imbalance 03/22/2019  . Chronic kidney disease, stage IV (severe) ([place]) 02/11/2019  . Acute on chronic systolic (congestive) heart failure ([place]) 03/19/2018  . 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  Onychomycosis 05/23/2017  . Acid reflux 05/23/2017  . Cardiomyopathy ([place]) 05/23/2017  . Anginal pain ([place]) 06/01/2016  . Angina pectoris ([place]) 06/01/2016  . CHF exacerbation ([place]) 11/17/2015  . Congestive heart failure ([place]) 11/17/2015  . Chronic combined systolic and diastolic heart failure ([place]) 11/16/2015  . Type 2 diabetes mellitus with hyperosmolar nonketotic hyperglycemia ([place]) 05/08/2015  . Hyponatremia 05/08/2015  . Hypochloremia 05/08/2015  . Type 2 diabetes mellitus with other specified complication ([place]) 88/33/7445  . Type 2 diabetes mellitus with diabetic polyneuropathy ([place]) 12/16/2013  . Type 2 diabetes mellitus ([place]) 12/16/2013  . Coronary artery disease involving native coronary artery of native heart with angina pectoris ([place]) 09/18/2013  . Coronary arteriosclerosis 09/18/2013  . Heme positive stool 06/12/2013  . Occult blood in stools 06/12/2013  . Seasonal allergic rhinitis 07/11/2012  . Polysubstance abuse ([place]) 12/24/2010  . Nonischemic cardiomyopathy ([place])   . Diabetic retinopathy of both eyes associated with type 2 diabetes mellitus ([place])   . Hyperlipemia 11/16/2009  . Obesity 11/16/2009  . Tobacco dependence 11/16/2009  . Essential hypertension 11/16/2009  . Hyperlipidemia 11/16/2009  . Tobacco dependence syndrome 11/16/2009    Past Surgical History:  Procedure Laterality Date  . APPENDECTOMY    . CARDIAC CATHETERIZATION  2005   4 frech cath  . COLONOSCOPY N/A 08/01/2013   Procedure: COLONOSCOPY;  Surgeon: Rogene Houston, MD;  Location: [facility] ENDO SUITE;  Service: Endoscopy;  Laterality: N/A;  rescheduled to [place] notified pt  . CORONARY STENT  INTERVENTION N/A 03/19/2018   Procedure: CORONARY STENT INTERVENTION;  Surgeon: Leonie Man, MD;  Location: [place] CV LAB;  Service: Cardiovascular;  Laterality: N/A;  . ESOPHAGOGASTRODUODENOSCOPY N/A 04/23/2015   Procedure: ESOPHAGOGASTRODUODENOSCOPY (EGD);  Surgeon: Rogene Houston, MD;  Location: [facility]  ENDO SUITE;  Service: Endoscopy;  Laterality: N/A;  1:25  . RIGHT/LEFT HEART CATH AND CORONARY ANGIOGRAPHY N/A 06/03/2016   Procedure: Right/Left Heart Cath and Coronary Angiography;  Surgeon: Belva Crome, MD;  Location: [place] CV LAB;  Service: Cardiovascular;  Laterality: N/A;  . RIGHT/LEFT HEART CATH AND CORONARY ANGIOGRAPHY N/A 03/19/2018   Procedure: RIGHT/LEFT HEART CATH AND CORONARY ANGIOGRAPHY;  Surgeon: Jolaine Artist, MD;  Location: [place] CV LAB;  Service: Cardiovascular;  Laterality: N/A;  . SUBQ ICD IMPLANT N/A 05/27/2019   Procedure: SUBQ ICD IMPLANT;  Surgeon: Evans Lance, MD;  Location: [place] CV LAB;  Service: Cardiovascular;  Laterality: N/A;       Family History  Problem Relation Age of Onset  . Hypertension Father   . Diabetes Father   . Lung cancer Mother   . Alcohol abuse Brother   . Colon cancer Neg Hx     Social History   Tobacco Use  . Smoking status: Never Smoker  . Smokeless tobacco: Current User    Types: Snuff  . Tobacco comment: dips snuff about 6 cans/week for 3 years  Vaping Use  . Vaping Use: Never used  Substance Use Topics  . Alcohol use: Yes    Alcohol/week: 1.0 standard drink    Types: 1 Cans of beer per week    Comment: occ - has cut down, now once every 2-3 months  . Drug use: Yes    Types: Marijuana    Comment: occasional use - smoking less    Home Medications Prior to Admission medications   Medication Sig Start Date End Date Taking? Authorizing Provider  aspirin EC 81 MG tablet Take 81 mg by mouth daily.    [provider]  blood glucose meter kit and supplies KIT Dispense based on patient and insurance preference. Test four times daily as directed. (FOR ICD-10 : E11.9 03/21/19   [name], MD  Blood Glucose Monitoring Suppl (ONETOUCH VERIO) w/Device KIT 1 Device by Does not apply route 2 (two) times daily. 04/01/19   Loman Brooklyn, FNP  Capsaicin (ICY HOT ARTHRITIS THERAPY EX) Apply 1  application topically 2 (two) times daily as needed (back pain).     [provider]  carvedilol (COREG) 6.25 MG tablet Take 1 tablet (6.25 mg total) by mouth 2 (two) times daily with a meal. 02/04/19   Larey Dresser, MD  Cholecalciferol (VITAMIN D3) 25 MCG (1000 UT) CAPS Take 2,000 Units by mouth daily.    [provider]  Continuous Blood Gluc Sensor (FREESTYLE LIBRE 2 SENSOR) MISC Use to test blood sugar up to 6 times daily as directed. E11.9 09/23/19   [name], MD  fluticasone (FLONASE) 50 MCG/ACT nasal spray INSTILL 2 SPRAYS INTO EACH NOSTRIL DAILY. 01/20/20   [name], MD  glucose blood (ONETOUCH VERIO) test strip USE FOUR TIMES DAILY AS NEEDED Dx E11.9 08/30/19   [name], MD  hydrALAZINE (APRESOLINE) 100 MG tablet TAKE (1) TABLET BY MOUTH (3) TIMES DAILY. 12/23/19   [name], MD  icosapent Ethyl (VASCEPA) 1 g capsule Take 2 capsules (2 g total) by mouth 2 (two) times daily. 02/04/19   Larey Dresser, MD  insulin glargine, 2 Unit Dial, (TOUJEO MAX SOLOSTAR) 300 UNIT/ML Solostar Pen INJECT 50 UNITS UNDER THE SKIN BEFORE BREAKFAST AND 40 UNITS UNDER THE SKIN BEFORE SUPPER. 01/31/20   [name], MD  isosorbide mononitrate (IMDUR) 120 MG 24 hr tablet TAKE (1) TABLET BY MOUTH ONCE DAILY. 12/23/19   [name], MD  Lancets [facility] ULTRASOFT) lancets Use as instructed   DX E11.9 08/30/19   [name], MD  loratadine (CLARITIN) 10 MG tablet Take 1 tablet (10 mg total) by mouth daily. 04/12/19   Janora Norlander, DO  Multiple Vitamins-Minerals (CENTRUM SILVER ADULT 50+ PO) Take 1 tablet by mouth daily.    [provider]  nabumetone (RELAFEN) 500 MG tablet TAKE TWO TABLETS BY MOUTH TWICE DAILY 09/18/18   [name], MD  nitroGLYCERIN (NITROSTAT) 0.4 MG SL tablet DISSOLVE 1 TAB UNDER TOUNGE FOR CHEST PAIN. MAY REPEAT EVERY 5 MINUTES FOR 3 DOSES. IF NO RELIEF CALL 911 OR GO TO ER 09/20/18   [name], MD  Omega-3 Fatty Acids (FISH  OIL) 1000 MG CPDR Take 1,000 mg by mouth daily.     [provider]  pantoprazole (PROTONIX) 40 MG tablet Take 1 tablet (40 mg total) by mouth daily. 07/11/19   [name], MD  potassium chloride SA (KLOR-CON) 20 MEQ tablet TAKE (1) TABLET BY MOUTH ONCE DAILY. 12/23/19   [name], MD  pregabalin (LYRICA) 200 MG capsule Take 2 capsules (400 mg total) by mouth at bedtime. 03/18/19   [name], MD  PROAIR HFA 108 [id number] Base) MCG/ACT inhaler USE 2 PUFFS EVERY 6 HOURS AS NEEDED 11/11/19   [name], MD  ranolazine (RANEXA) 500 MG 12 hr tablet TAKE 1 TABLET BY MOUTH TWICE DAILY. 06/24/19   Larey Dresser, MD  rosuvastatin (CRESTOR) 10 MG tablet TAKE 1 TABLET BY MOUTH ONCE A DAY. 12/23/19   [name], MD  Semaglutide, 1 MG/DOSE, (OZEMPIC, 1 MG/DOSE,) 2 MG/1.5ML SOPN Inject 1 mg into the skin once a week. 12/09/19   [name], MD  terbinafine (LAMISIL) 1 % cream Apply 1 application topically 2 (two) times daily. Apply to both feet and between toes 04/10/18   [name], MD  tiZANidine (ZANAFLEX) 4 MG tablet Take 1 tablet (4 mg total) by mouth every 6 (six) hours as needed for muscle spasms. 09/03/19   [name], MD  torsemide (DEMADEX) 100 MG tablet Take 1 tablet (100 mg total) by mouth daily. 01/21/20   Imogene Burn, PA-C  Vitamin D, Ergocalciferol, (DRISDOL) 1.25 MG (50000 UNIT) CAPS capsule Take 1 capsule (50,000 Units total) by mouth every 7 (seven) days. 12/10/19 12/08/20  [name], MD  XARELTO 2.5 MG TABS tablet TAKE (1) TABLET BY MOUTH TWICE DAILY. 09/27/19   Evans Lance, MD    Allergies    Sulfa antibiotics and Penicillins  Review of Systems   Review of Systems  Constitutional: Negative for chills and fever.  HENT: Negative for sore throat.   Eyes: Negative for visual disturbance.  Respiratory: Positive for shortness of breath. Negative for cough.   Cardiovascular: Positive for leg swelling. Negative for chest pain.  Gastrointestinal:  Negative for abdominal pain, diarrhea, nausea and vomiting.  Genitourinary: Negative for dysuria and frequency.  Musculoskeletal: Negative for back pain and neck pain.  Skin: Negative for rash.  Neurological: Negative for weakness, numbness and headaches.  Hematological: Negative for adenopathy.  Psychiatric/Behavioral: Negative for behavioral problems.    Physical Exam Updated Vital Signs BP (!) 169/95 (BP  Location: Left Arm)   Pulse 87   Temp 97.9 F (36.6 C) (Oral)   Resp 18   Ht 1.753 m (5' 9" )   Wt 100.9 kg   SpO2 92%   BMI 32.84 kg/m   Physical Exam Vitals and nursing note reviewed.  Constitutional:      General: He is not in acute distress.    Appearance: He is well-developed and well-nourished.  HENT:     Head: Normocephalic and atraumatic.     Mouth/Throat:     Mouth: Oropharynx is clear and moist.     Pharynx: No oropharyngeal exudate.  Eyes:     General: No scleral icterus.       Right eye: No discharge.        Left eye: No discharge.     Extraocular Movements: EOM normal.     Conjunctiva/sclera: Conjunctivae normal.     Pupils: Pupils are equal, round, and reactive to light.  Neck:     Thyroid: No thyromegaly.     Vascular: No JVD.  Cardiovascular:     Rate and Rhythm: Normal rate and regular rhythm.     Pulses: Intact distal pulses.     Heart sounds: Normal heart sounds. No murmur heard. No friction rub. No gallop.      Comments: JVD present bilaterally Pulmonary:     Effort: Pulmonary effort is normal. No respiratory distress.     Breath sounds: Rales present. No wheezing.     Comments: Subtle rales, no respiratory distress Abdominal:     General: Bowel sounds are normal. There is no distension.     Palpations: Abdomen is soft. There is no mass.     Tenderness: There is no abdominal tenderness.  Musculoskeletal:        General: No tenderness. Normal range of motion.     Cervical back: Normal range of motion and neck supple.     Right lower leg:  Edema present.     Left lower leg: Edema present.  Lymphadenopathy:     Cervical: No cervical adenopathy.  Skin:    General: Skin is warm and dry.     Findings: No erythema or rash.  Neurological:     Mental Status: He is alert.     Coordination: Coordination normal.  Psychiatric:        Mood and Affect: Mood and affect normal.        Behavior: Behavior normal.     ED Results / Procedures / Treatments   Labs (all labs ordered are listed, but only abnormal results are displayed) Labs Reviewed  CBC WITH DIFFERENTIAL/PLATELET - Abnormal; Notable for the following components:      Result Value   RBC 4.03 (*)    Hemoglobin 10.4 (*)    HCT 33.8 (*)    MCH 25.8 (*)    RDW 16.3 (*)    All other components within normal limits  BASIC METABOLIC PANEL - Abnormal; Notable for the following components:   Glucose, Bld 199 (*)    BUN 58 (*)    Creatinine, Ser 2.61 (*)    Calcium 8.4 (*)    GFR, Estimated 27 (*)    All other components within normal limits  BRAIN NATRIURETIC PEPTIDE - Abnormal; Notable for the following components:   B Natriuretic Peptide 472.0 (*)    All other components within normal limits  HEPATIC FUNCTION PANEL - Abnormal; Notable for the following components:   Total Protein 5.6 (*)  Albumin 2.5 (*)    All other components within normal limits  TROPONIN I (HIGH SENSITIVITY) - Abnormal; Notable for the following components:   Troponin I (High Sensitivity) 41 (*)    All other components within normal limits  RESP PANEL BY RT-PCR (FLU A&B, COVID) [id number]    EKG EKG Interpretation  Date/Time:  Saturday February 01 2020 19:36:22 EST Ventricular Rate:  85 PR Interval:    QRS Duration: 99 QT Interval:  411 QTC Calculation: 489 R Axis:   11 Text Interpretation: Sinus rhythm Nonspecific T abnormalities, lateral leads Borderline prolonged QT interval Baseline wander in lead(s) V2 Confirmed by Noemi Chapel [phone]) on 02/01/2020 7:47:39 PM   Radiology DG  Chest Port 1 View  Result Date: 02/01/2020 CLINICAL DATA:  Shortness of breath, bilateral lower leg swelling. Coronary artery disease. EXAM: PORTABLE CHEST 1 VIEW COMPARISON:  Chest x-ray 12/09/2019 FINDINGS: Redemonstration of a left chest wall ICD implant in stable position. The heart size and mediastinal contours are unchanged. Persistent interstitial coarsening. Slightly decreased inspiratory effort compared to prior. Suggestion of vague patchy airspace opacities within bilateral lower lung zones. Mild pulmonary edema. No pleural effusion. No pneumothorax. No acute osseous abnormality. IMPRESSION: 1. Vague patchy airspace opacities within bilateral lower lung zones could represent atelectasis versus infection/inflammation. 2. Mild pulmonary edema. Electronically Signed   By: Iven Finn M.D.   On: 02/01/2020 20:14    Procedures Procedures (including critical care time)  Medications Ordered in ED Medications  furosemide (LASIX) injection 40 mg (40 mg Intravenous Given 02/01/20 1941)    ED Course  I have reviewed the triage vital signs and the nursing notes.  Pertinent labs & imaging results that were available during my care of the patient were reviewed by me and considered in my medical decision making (see chart for details).    MDM Rules/Calculators/A&P                          There is anasarca to the level of the umbilicus, bilateral severe pitting edema which is symmetrical, JVD, orthopnea, all this is consistent with congestive heart failure exacerbation.  Oxygen of 92% on room air, blood pressure 169/95, afebrile with a pulse of 87.  At this time the patient will need to be diuresed, will obtain some basic labs including renal function, chest x-ray and discuss with hospitalist for admission.  This patient has some pulmonary edema clinically and on x-ray  Labs are remarkable only for mild anemia, his metabolic panel shows pretty stable renal dysfunction  X-ray confirms  atelectasis or infiltrate at the bases consistent with pulmonary edema  Covid test negative  Discussed with hospitalist Dr. Landis Gandy who will admit.  Lasix given prior to admission  Final Clinical Impression(s) / ED Diagnoses Final diagnoses:  Acute diastolic congestive heart failure ([place])      Noemi Chapel, MD 02/01/20 2053

## 2020-02-01 NOTE — H&P (Signed)
TRH H&P   Patient Demographics:    Brad Mendoza, is a 64 y.o. male  MRN: [id number]   DOB - [birth year]  Admit Date - 02/01/2020  Outpatient Primary MD for the patient is [name], MD  Referring MD/NP/PA: Dr Sabra Heck  Outpatient Specialists: Dr Domenic Polite    Patient coming from: home  Chief Complaint  Patient presents with  . Edema      HPI:    Brad Mendoza  is a 64 y.o. male, with past medical history of cardiomyopathy (ischemic/nonischemic, most recent echo in 01/2019 with EF 30 to 35%, CAD, status post RCA stent, on Xarelto per [facility] protocol, CKD, diabetes mellitus, AICD, patient been followed closely recently by cardiology and nephrology Dr. [name] due to retention, and weight gain, where his torsemide has been gradually uptitrated over last 2 months from 40 mg once daily to 100 mg once daily by Dr. [name], as well fluid gain from 207 to 232y by Dr. [name] record, patient presents to ED secondary to worsening shortness of breath, orthopnea and exertional dyspnea, worsening lower extremity over the last few weeks, report now is more severe, and with him during the increased dose torsemide not helping that prompted him to come to ED, he denies any chest pain, hemoptysis, fever, chills, cough. - in ED there was no hypoxia, but chest x-ray significant for volume overload, BNP is elevated at 22, his creatinine at baseline 2.7, high-sensitivity troponin elevated at 1, he denies any chest pain, he received 1 dose of IV Lasix and [facility] consulted to admit..   Review of systems:    In addition to the HPI above, No Fever-chills, he does report fluid retention and weight gain No Headache, No changes with Vision or hearing, No problems swallowing food or Liquids, No Chest pain, Cough, he does report orthopnea No Abdominal pain, No Nausea or Vommitting, Bowel  movements are regular, No Blood in stool or Urine, No dysuria, No new skin rashes or bruises, No new joints pains-aches,  No new weakness, tingling, numbness in any extremity, No recent weight gain or loss, No polyuria, polydypsia or polyphagia, No significant Mental Stressors.  A full 10 point Review of Systems was done, except as stated above, all other Review of Systems were negative.   With Past History of the following :    Past Medical History:  Diagnosis Date  . CAD (coronary artery disease)    DES to RCA January 2020  . Chronic back pain   . CKD (chronic kidney disease), stage IV ([place])   . Diverticulosis   . Essential hypertension   . GERD (gastroesophageal reflux disease)   . Hyperlipidemia   . ICD (implantable cardioverter-defibrillator) in place    [facility] [employer] - Dr. Lovena Le, implanted April 2021  . Onychomycosis   . Polysubstance abuse ([place])   . 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 Type 2 diabetes mellitus ([place])       Past Surgical History:  Procedure Laterality Date  . APPENDECTOMY    . CARDIAC CATHETERIZATION  2005   4 frech cath  . COLONOSCOPY N/A 08/01/2013   Procedure: COLONOSCOPY;  Surgeon: Rogene Houston, MD;  [facility];  Service: Endoscopy;  Laterality: N/A;  rescheduled to [place] notified pt  . CORONARY STENT INTERVENTION N/A 03/19/2018   Procedure: CORONARY STENT INTERVENTION;  Surgeon: Leonie Man, MD;  Location: [place] [facility];  Service: Cardiovascular;  Laterality: N/A;  . ESOPHAGOGASTRODUODENOSCOPY N/A 04/23/2015   Procedure: ESOPHAGOGASTRODUODENOSCOPY (EGD);  Surgeon: Rogene Houston, MD;  [facility];  Service: Endoscopy;  Laterality: N/A;  1:25  . RIGHT/LEFT HEART CATH AND CORONARY ANGIOGRAPHY N/A 06/03/2016   Procedure: Right/Left Heart Cath and Coronary Angiography;  Surgeon: Belva Crome, MD;  Location: [place] [facility];  Service: Cardiovascular;  Laterality: N/A;  .  RIGHT/LEFT HEART CATH AND CORONARY ANGIOGRAPHY N/A 03/19/2018   Procedure: RIGHT/LEFT HEART CATH AND CORONARY ANGIOGRAPHY;  Surgeon: Jolaine Artist, MD;  Location: [place] [facility];  Service: Cardiovascular;  Laterality: N/A;  . SUBQ ICD IMPLANT N/A 05/27/2019   Procedure: SUBQ ICD IMPLANT;  Surgeon: Evans Lance, MD;  Location: [place] [facility];  Service: Cardiovascular;  Laterality: N/A;      Social History:     Social History   Tobacco Use  . Smoking status: Never Smoker  . Smokeless tobacco: Current User    Types: Snuff  . Tobacco comment: dips snuff about 6 cans/week for 3 years  Substance Use Topics  . Alcohol use: Yes    Alcohol/week: 1.0 standard drink    Types: 1 Cans of beer per week    Comment: occ - has cut down, now once every 2-3 months       Family History :     Family History  Problem Relation Age of Onset  . Hypertension Father   . Diabetes Father   . Lung cancer Mother   . Alcohol abuse Brother   . Colon cancer Neg Hx       Home Medications:   Prior to Admission medications   Medication Sig Start Date End Date Taking? Authorizing Provider  aspirin EC 81 MG tablet Take 81 mg by mouth daily.    [provider]  blood glucose meter kit and supplies KIT Dispense based on patient and insurance preference. Test four times daily as directed. (FOR ICD-10 : E11.9 03/21/19   [name], MD  Blood Glucose Monitoring Suppl (ONETOUCH VERIO) w/Device KIT 1 Device by Does not apply route 2 (two) times daily. 04/01/19   Loman Brooklyn, FNP  Capsaicin (ICY HOT ARTHRITIS THERAPY EX) Apply 1 application topically 2 (two) times daily as needed (back pain).     [provider]  carvedilol (COREG) 6.25 MG tablet Take 1 tablet (6.25 mg total) by mouth 2 (two) times daily with a meal. 02/04/19   Larey Dresser, MD  Cholecalciferol (VITAMIN D3) 25 MCG (1000 UT) CAPS Take 2,000 Units by mouth daily.    [provider]  Continuous Blood  Gluc Sensor (FREESTYLE LIBRE 2 SENSOR) MISC Use to test blood sugar up to 6 times daily as directed. E11.9 09/23/19   [name], MD  fluticasone (FLONASE) 50 MCG/ACT nasal spray INSTILL 2 SPRAYS INTO EACH NOSTRIL DAILY. 01/20/20   [name], MD  glucose blood (  ONETOUCH VERIO) test strip USE FOUR TIMES DAILY AS NEEDED Dx E11.9 08/30/19   [name], MD  hydrALAZINE (APRESOLINE) 100 MG tablet TAKE (1) TABLET BY MOUTH (3) TIMES DAILY. 12/23/19   [name], MD  icosapent Ethyl (VASCEPA) 1 g capsule Take 2 capsules (2 g total) by mouth 2 (two) times daily. 02/04/19   Larey Dresser, MD  insulin glargine, 2 Unit Dial, (TOUJEO MAX SOLOSTAR) 300 UNIT/ML Solostar Pen INJECT 50 UNITS UNDER THE SKIN BEFORE BREAKFAST AND 40 UNITS UNDER THE SKIN BEFORE SUPPER. 01/31/20   [name], MD  isosorbide mononitrate (IMDUR) 120 MG 24 hr tablet TAKE (1) TABLET BY MOUTH ONCE DAILY. 12/23/19   [name], MD  Lancets [facility] ULTRASOFT) lancets Use as instructed   DX E11.9 08/30/19   [name], MD  loratadine (CLARITIN) 10 MG tablet Take 1 tablet (10 mg total) by mouth daily. 04/12/19   Janora Norlander, DO  Multiple Vitamins-Minerals (CENTRUM SILVER ADULT 50+ PO) Take 1 tablet by mouth daily.    [provider]  nabumetone (RELAFEN) 500 MG tablet TAKE TWO TABLETS BY MOUTH TWICE DAILY 09/18/18   [name], MD  nitroGLYCERIN (NITROSTAT) 0.4 MG SL tablet DISSOLVE 1 TAB UNDER TOUNGE FOR CHEST PAIN. MAY REPEAT EVERY 5 MINUTES FOR 3 DOSES. IF NO RELIEF CALL 911 OR GO TO ER 09/20/18   [name], MD  Omega-3 Fatty Acids (FISH OIL) 1000 MG CPDR Take 1,000 mg by mouth daily.     [provider]  pantoprazole (PROTONIX) 40 MG tablet Take 1 tablet (40 mg total) by mouth daily. 07/11/19   [name], MD  potassium chloride SA (KLOR-CON) 20 MEQ tablet TAKE (1) TABLET BY MOUTH ONCE DAILY. 12/23/19   [name], MD  pregabalin (LYRICA) 200 MG capsule Take 2 capsules (400 mg  total) by mouth at bedtime. 03/18/19   [name], MD  PROAIR HFA 108 [phone] Base) MCG/ACT inhaler USE 2 PUFFS EVERY 6 HOURS AS NEEDED 11/11/19   [name], MD  ranolazine (RANEXA) 500 MG 12 hr tablet TAKE 1 TABLET BY MOUTH TWICE DAILY. 06/24/19   Larey Dresser, MD  rosuvastatin (CRESTOR) 10 MG tablet TAKE 1 TABLET BY MOUTH ONCE A DAY. 12/23/19   [name], MD  Semaglutide, 1 MG/DOSE, (OZEMPIC, 1 MG/DOSE,) 2 MG/1.5ML SOPN Inject 1 mg into the skin once a week. 12/09/19   [name], MD  terbinafine (LAMISIL) 1 % cream Apply 1 application topically 2 (two) times daily. Apply to both feet and between toes 04/10/18   [name], MD  tiZANidine (ZANAFLEX) 4 MG tablet Take 1 tablet (4 mg total) by mouth every 6 (six) hours as needed for muscle spasms. 09/03/19   [name], MD  torsemide (DEMADEX) 100 MG tablet Take 1 tablet (100 mg total) by mouth daily. 01/21/20   Imogene Burn, PA-C  Vitamin D, Ergocalciferol, (DRISDOL) 1.25 MG (50000 UNIT) CAPS capsule Take 1 capsule (50,000 Units total) by mouth every 7 (seven) days. 12/10/19 12/08/20  [name], MD  XARELTO 2.5 MG TABS tablet TAKE (1) TABLET BY MOUTH TWICE DAILY. 09/27/19   Evans Lance, MD     Allergies:     Allergies  Allergen Reactions  . Sulfa Antibiotics Shortness Of Breath  . Penicillins Rash and Other (See Comments)    Has patient had a PCN reaction causing immediate rash, facial/tongue/throat swelling, SOB or lightheadedness with hypotension: No Has patient had a PCN reaction causing severe rash involving mucus  membranes or skin necrosis: No Has patient had a PCN reaction that required hospitalization No Has patient had a PCN reaction occurring within the last 10 years: No If all of the above answers are "NO", then may proceed with Cephalosporin use.      Physical Exam:   Vitals  Blood pressure (!) 169/95, pulse 87, temperature 97.9 F (36.6 C), temperature source Oral, resp. rate 18, height 5' 9"   (1.753 m), weight 100.9 kg, SpO2 92 %.   1. General developed male, laying in bed, no apparent distress  2. Normal affect and insight, Not Suicidal or Homicidal, Awake Alert, Oriented X 3.  3. No F.N deficits, ALL C.Nerves Intact, Strength 5/5 all 4 extremities, Sensation intact all 4 extremities, Plantars down going.  4. Ears and Eyes appear Normal, Conjunctivae clear, PERRLA. Moist Oral Mucosa.  5. Supple Neck, levator JVD JVD, No cervical lymphadenopathy appriciated, No Carotid Bruits.  6. Symmetrical Chest wall movement, Good air movement bilaterally, bibasilar crackles, +2 edema  7. RRR, No Gallops, Rubs or Murmurs, No Parasternal Heave.  8. Positive Bowel Sounds, Abdomen Soft, No tenderness, No organomegaly appriciated,No rebound -guarding or rigidity.  9.  No Cyanosis, Normal Skin Turgor, No Skin Rash or Bruise.  10. Good muscle tone,  joints appear normal , no effusions, Normal ROM.  11. No Palpable Lymph Nodes in Neck or Axillae     Data Review:    CBC Recent Labs  Lab 02/01/20 1848  WBC 6.6  HGB 10.4*  HCT 33.8*  PLT 284  MCV 83.9  MCH 25.8*  MCHC 30.8  RDW 16.3*  LYMPHSABS 1.0  MONOABS 0.8  EOSABS 0.3  BASOSABS 0.1   ------------------------------------------------------------------------------------------------------------------  Chemistries  Recent Labs  Lab 02/01/20 1848  NA 139  K 4.4  CL 109  CO2 23  GLUCOSE 199*  BUN 58*  CREATININE 2.61*  CALCIUM 8.4*  AST 30  ALT 31  ALKPHOS 105  BILITOT 0.4   ------------------------------------------------------------------------------------------------------------------ estimated creatinine clearance is 33.9 mL/min (A) (by C-G formula based on SCr of 2.61 mg/dL (H)). ------------------------------------------------------------------------------------------------------------------ No results for input(s): TSH, T4TOTAL, T3FREE, THYROIDAB in the last 72 hours.  Invalid input(s):  FREET3  Coagulation profile No results for input(s): INR, PROTIME in the last 168 hours. ------------------------------------------------------------------------------------------------------------------- No results for input(s): DDIMER in the last 72 hours. -------------------------------------------------------------------------------------------------------------------  Cardiac Enzymes No results for input(s): CKMB, TROPONINI, MYOGLOBIN in the last 168 hours.  Invalid input(s): CK ------------------------------------------------------------------------------------------------------------------    Component Value Date/Time   BNP 472.0 (H) 02/01/2020 1848     ---------------------------------------------------------------------------------------------------------------  Urinalysis    Component Value Date/Time   COLORURINE STRAW (A) 03/28/2019 1905   APPEARANCEUR CLEAR 03/28/2019 1905   APPEARANCEUR Clear 02/05/2019 1357   LABSPEC 1.020 03/28/2019 1905   PHURINE 5.0 03/28/2019 1905   GLUCOSEU >=500 (A) 03/28/2019 1905   HGBUR SMALL (A) 03/28/2019 1905   BILIRUBINUR NEGATIVE 03/28/2019 1905   BILIRUBINUR Negative 02/05/2019 1357   [facility] 03/28/2019 1905   PROTEINUR 100 (A) 03/28/2019 1905   UROBILINOGEN negative 11/07/2014 1154   UROBILINOGEN 0.2 05/07/2013 0851   NITRITE NEGATIVE 03/28/2019 1905   LEUKOCYTESUR NEGATIVE 03/28/2019 1905    ----------------------------------------------------------------------------------------------------------------   Imaging Results:    DG Chest Port 1 View  Result Date: 02/01/2020 CLINICAL DATA:  Shortness of breath, bilateral lower leg swelling. Coronary artery disease. EXAM: PORTABLE CHEST 1 VIEW COMPARISON:  Chest x-ray 12/09/2019 FINDINGS: Redemonstration of a left chest wall ICD implant in stable position. The heart size and mediastinal  contours are unchanged. Persistent interstitial coarsening. Slightly decreased  inspiratory effort compared to prior. Suggestion of vague patchy airspace opacities within bilateral lower lung zones. Mild pulmonary edema. No pleural effusion. No pneumothorax. No acute osseous abnormality. IMPRESSION: 1. Vague patchy airspace opacities within bilateral lower lung zones could represent atelectasis versus infection/inflammation. 2. Mild pulmonary edema. Electronically Signed   By: Iven Finn M.D.   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 -Status post ICD, followed by Dr. Lovena Le. -Patient presents with evidence of significant volume overload, lower extremity edema, elevated BNP and volume overload on chest x-ray. -Continue with Coreg, hydralazine and Imdur, no ACE/ARB/Entresto due to renal insufficiency -By Dr. [name] recent note, weight gain from 207> 232 pounds due to noncompliance with medications and diet, his torsemide significantly increased to 100 mg oral daily. -Admitted under CHF pathway, continue with IV Lasix 60 mg IV twice daily, fluid restrictions, daily weight.  History of CAD -Per cardiology note "status post DES to the proximal RCA 02/2018 on Xarelto 2.5 mg twice daily as part of the [facility] protocol' -Troponin mildly  elevated at 41, it is around his baseline, most likely elevated in the setting of CHF and CKD . -Continue with beta-blockers, statin and aspirin .  Hyperlipidemia -New with home dose statin  Hypertension -Blood pressure elevated, continue with hydralazine, Imdur and Coreg  Tobacco dependence -He is chewing tobacco, he was counseled  CKD stage V -Baseline, continue to monitor as on IV diuresis, avoid nephrotoxic medications  Beatties mellitus -We will resume at lower dose Toujeo instead of 50 units daytime and 40 units at bedtime, he will be started on 30 units twice daily, and will add sliding scale.   DVT Prophylaxis Xarelto  AM Labs Ordered, also please review Full Orders  Family Communication: Admission, patients condition and plan of care including tests being ordered have been discussed with the patient  who indicate understanding and agree with the plan and Code Status.  Code Status Full  Likely DC to  Home  Condition GUARDED    Consults called: none    Admission status: inpatient    Time spent in minutes : 60 minutes   Phillips Climes M.D on 02/01/2020 at [place] PM   [facility] - Office  [phone]

## 2020-02-01 NOTE — ED Triage Notes (Signed)
Pt bilateral feet and lower legs swelling for a month.

## 2020-02-02 ENCOUNTER — Inpatient Hospital Stay (HOSPITAL_COMMUNITY): Payer: Medicare Other

## 2020-02-02 DIAGNOSIS — R0602 Shortness of breath: Secondary | ICD-10-CM

## 2020-02-02 DIAGNOSIS — E11319 Type 2 diabetes mellitus with unspecified diabetic retinopathy without macular edema: Secondary | ICD-10-CM

## 2020-02-02 DIAGNOSIS — E782 Mixed hyperlipidemia: Secondary | ICD-10-CM

## 2020-02-02 LAB — BASIC METABOLIC PANEL
Anion gap: 7 (ref 5–15)
BUN: 54 mg/dL — ABNORMAL HIGH (ref 8–23)
CO2: 21 mmol/L — ABNORMAL LOW (ref 22–32)
Calcium: 8.1 mg/dL — ABNORMAL LOW (ref 8.9–10.3)
Chloride: 109 mmol/L (ref 98–111)
Creatinine, Ser: 2.5 mg/dL — ABNORMAL HIGH (ref 0.61–1.24)
GFR, Estimated: 28 mL/min — ABNORMAL LOW (ref >60)
Glucose, Bld: 100 mg/dL — ABNORMAL HIGH (ref 70–99)
Potassium: 4 mmol/L (ref 3.5–5.1)
Sodium: 137 mmol/L (ref 135–145)

## 2020-02-02 LAB — HEMOGLOBIN A1C
Hgb A1c MFr Bld: 8.1 % — ABNORMAL HIGH (ref 4.8–5.6)
Mean Plasma Glucose: 185.77 mg/dL

## 2020-02-02 LAB — ECHOCARDIOGRAM COMPLETE
Area-P 1/2: 5.23 cm2
Height: 69 in
S' Lateral: 5.11 cm
Weight: 3558.4 oz

## 2020-02-02 LAB — GLUCOSE, CAPILLARY
Glucose-Capillary: 95 mg/dL (ref 70–99)
Glucose-Capillary: 116 mg/dL — ABNORMAL HIGH (ref 70–99)
Glucose-Capillary: 85 mg/dL (ref 70–99)
Glucose-Capillary: 96 mg/dL (ref 70–99)

## 2020-02-02 LAB — HIV ANTIBODY (ROUTINE TESTING W REFLEX): HIV Screen 4th Generation wRfx: NONREACTIVE

## 2020-02-02 MED ORDER — PERFLUTREN LIPID MICROSPHERE
1.0000 mL | INTRAVENOUS | Status: AC | PRN
  Administered 2020-02-02: 08:00:00 2 mL via INTRAVENOUS
  Filled 2020-02-02: qty 10

## 2020-02-02 NOTE — Progress Notes (Signed)
  Echocardiogram 2D Echocardiogram has been performed.  Brad Mendoza 02/02/2020, 8:14 AM

## 2020-02-02 NOTE — Plan of Care (Signed)

## 2020-02-02 NOTE — Progress Notes (Signed)
PROGRESS NOTE    Brad Mendoza  QHU:[id number] DOB: 12-11-1955 DOA: 02/01/2020 PCP: [name], MD    Brief Narrative:  64 year old male with a history of cardiomyopathy, ejection fraction of 30 to 35%, coronary artery disease, chronic kidney disease stage IV, presents to the hospital with volume overload.  He was noted to have significant weight gain and worsening lower extremity edema.  His diuretics were being managed as an outpatient, but despite adjusting torsemide dose, he continued to retain fluid.  He was admitted for IV diuresis.   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  Weight on admission 221 pounds -Continue on Coreg, hydralazine, Imdur, not on Entresto due to renal insufficiency  History of coronary artery disease -Status post DES to proximal RCA in 02/2018, on Xarelto 2.5 mg twice daily, aspirin, Coreg, Ranexa, Imdur as part of the [facility] protocol  Hyperlipidemia.   -Continue statin  Chronic kidney disease stage IV -Baseline creatinine approximately 2.5 -Continue to monitor in setting of diuresis  Diabetes, type II, insulin-dependent -Currently on Lantus and sliding scale insulin -Blood sugars have been stable   DVT prophylaxis:  rivaroxaban (XARELTO) tablet 2.5 mg  Code  Status: Full code Family Communication: Discussed with patient Disposition Plan: Status is: Inpatient  Remains inpatient appropriate because:IV treatments appropriate due to intensity of illness or inability to take PO   Dispo: The patient is from: Home              Anticipated d/c is to: Home              Anticipated d/c date is: 3 days              Patient currently is not medically stable to d/c.         Consultants:     Procedures:   Echo: EF 30 to 35%, global hypokinesis, grade 2 diastolic dysfunction  Antimicrobials:       Subjective: Continues to complain of lower extremity edema.  Weight is significantly elevated.  Still has some dyspnea on exertion  Objective: Vitals:   02/02/20 0043 02/02/20 0522 02/02/20 1420 02/02/20 1422  BP: (!) 161/92 (!) 148/93 135/81 135/81  Pulse: 89 83  76  Resp: 18 17  18   Temp: 98.2 F (36.8 C) 98 F (36.7 C)  98.8 F (37.1 C)  TempSrc:    Oral  SpO2: 97% 93%  100%  Weight:      Height:        Intake/Output Summary (Last 24 hours) at 02/02/2020 1751 Last data filed at 02/02/2020 1100 Gross per 24 hour  Intake 240 ml  Output 700 ml  Net -460 ml   Filed Weights   02/01/20 1505 02/01/20 1949  Weight: 105.2 kg 100.9 kg    Examination:  General exam: Appears calm and comfortable  Respiratory system: Clear to auscultation. Respiratory effort normal. Cardiovascular system: S1 & S2 heard, RRR. No JVD, murmurs, rubs, gallops or clicks.  Gastrointestinal system: Abdomen is nondistended, soft and nontender. No organomegaly or masses felt. Normal bowel sounds heard. Central nervous system: Alert and oriented. No focal neurological deficits. Extremities: 2+ pitting edema in the lower extremities Skin: No rashes, lesions or ulcers Psychiatry: Judgement and insight appear normal. Mood & affect appropriate.     Data Reviewed: I have personally reviewed following labs and imaging studies  CBC: Recent Labs  Lab  02/01/20 1848  WBC 6.6  NEUTROABS 4.5  HGB 10.4*  HCT 33.8*  MCV 83.9  PLT 660   Basic Metabolic Panel: Recent Labs  Lab 02/01/20 1848 02/02/20 0714  NA 139 137  K 4.4 4.0  CL 109 109  CO2 23 21*  GLUCOSE 199* 100*  BUN 58* 54*  CREATININE 2.61* 2.50*  CALCIUM 8.4* 8.1*   GFR: Estimated Creatinine Clearance: 35.4 mL/min (A) (by C-G formula based on SCr of 2.5 mg/dL (H)). Liver Function Tests: Recent Labs  Lab 02/01/20 1848  AST 30  ALT 31  ALKPHOS 105  BILITOT 0.4  PROT 5.6*  ALBUMIN 2.5*   No results for input(s): LIPASE, AMYLASE in the last 168 hours. No results for input(s): AMMONIA in the last 168 hours. Coagulation Profile: No results for input(s): INR, PROTIME in the last 168 hours. Cardiac Enzymes: No results for input(s): CKTOTAL, CKMB, CKMBINDEX, TROPONINI in the last 168 hours. BNP (last 3 results) No results for input(s): PROBNP in the last 8760 hours. HbA1C: Recent Labs    02/01/20 1848  HGBA1C 8.1*   CBG: Recent Labs  Lab 02/01/20 2207 02/01/20 2257 02/02/20 0723 02/02/20 1116 02/02/20 1704  GLUCAP 146* 121* 95 116* 85   Lipid Profile: No results for input(s): CHOL, HDL, LDLCALC, TRIG, CHOLHDL, LDLDIRECT in the last 72 hours. Thyroid Function Tests: No results for input(s): TSH, T4TOTAL, FREET4, T3FREE, THYROIDAB in the last 72 hours. Anemia Panel: No results for input(s): VITAMINB12, FOLATE, FERRITIN, TIBC, IRON, RETICCTPCT in the last 72 hours. Sepsis Labs: No results for input(s): PROCALCITON, LATICACIDVEN in the last 168 hours.  Recent Results (from the past 240 hour(s))  Resp Panel by RT-PCR (Flu A&B, Covid) Nasopharyngeal Swab     Status: None   Collection Time: 02/01/20  7:44 PM   Specimen: Nasopharyngeal Swab; Nasopharyngeal(NP) swabs in vial transport medium  Result Value Ref Range Status   SARS Coronavirus 2 by RT PCR NEGATIVE NEGATIVE Final    Comment: (NOTE) SARS-CoV-2 target nucleic acids are NOT DETECTED.  The  SARS-CoV-2 RNA is generally detectable in upper respiratory specimens during the acute phase of infection. The lowest concentration of SARS-CoV-2 viral copies this assay can detect is 138 copies/mL. A negative result does not preclude SARS-Cov-2 infection and should not be used as the sole basis for treatment or other patient management decisions. A negative result may occur with  improper specimen collection/handling, submission of specimen other than nasopharyngeal swab, presence of viral mutation(s) within the areas targeted by this assay, and inadequate number of viral copies(<138 copies/mL). A negative result must be combined with clinical observations, patient history, and epidemiological information. The expected result is Negative.  Fact Sheet for Patients:  EntrepreneurPulse.com.au  Fact Sheet for Healthcare Providers:  IncredibleEmployment.be  This test is no t yet approved or cleared by the [place] FDA and  has been authorized for detection and/or diagnosis of SARS-CoV-2 by FDA under an  Emergency Use Authorization (EUA). This EUA will remain  in effect (meaning this test can be used) for the duration of the COVID-19 declaration under Section 564(b)(1) of the Act, 21 U.S.C.section 360bbb-3(b)(1), unless the authorization is terminated  or revoked sooner.       Influenza A by PCR NEGATIVE NEGATIVE Final   Influenza B by PCR NEGATIVE NEGATIVE Final    Comment: (NOTE) The Xpert Xpress SARS-CoV-2/FLU/RSV plus assay is intended as an aid in the diagnosis of influenza from Nasopharyngeal swab specimens and should not be used as a sole basis for treatment. Nasal washings and aspirates are unacceptable for Xpert Xpress SARS-CoV-2/FLU/RSV testing.  Fact Sheet for Patients: EntrepreneurPulse.com.au  Fact Sheet for Healthcare Providers: IncredibleEmployment.be  This test is not yet approved or  cleared by the [place] FDA and has been authorized for detection and/or diagnosis of SARS-CoV-2 by FDA under an Emergency Use Authorization (EUA). This EUA will remain in effect (meaning this test can be used) for the duration of the COVID-19 declaration under Section 564(b)(1) of the Act, 21 U.S.C. section 360bbb-3(b)(1), unless the authorization is terminated or revoked.  Performed at [facility], [address] [place], [place] [address]          Radiology Studies: [facility] Chest [facility] 1 View  Result Date: 02/01/2020 CLINICAL DATA:  Shortness of breath, bilateral lower leg swelling. Coronary artery disease. EXAM: PORTABLE CHEST 1 VIEW COMPARISON:  Chest x-ray 12/09/2019 FINDINGS: Redemonstration of a left chest wall ICD implant in stable position. The heart size and mediastinal contours are unchanged. Persistent interstitial coarsening. Slightly decreased inspiratory effort compared to prior. Suggestion of vague patchy airspace opacities within bilateral lower lung zones. Mild pulmonary edema. No pleural effusion. No pneumothorax. No acute osseous abnormality. IMPRESSION: 1. Vague patchy airspace opacities within bilateral lower lung zones could represent atelectasis versus infection/inflammation. 2. Mild pulmonary edema. Electronically Signed   By: Iven Finn M.D.   On: 02/01/2020 20:14   ECHOCARDIOGRAM COMPLETE  Result Date: 02/02/2020    ECHOCARDIOGRAM REPORT   Patient Name:   JESTIN BURBACH [place] Date of Exam: 02/02/2020 Medical Rec #:  [id number]       Height:       69.0 in Accession #:    [id number]      Weight:       222.4 lb Date of Birth:  1955-10-08        BSA:          2.161 m Patient Age:    61 years        BP:           148/93 mmHg Patient Gender: M               HR:           82 bpm. Exam Location:  [facility] Procedure: 2D Echo, Cardiac Doppler, Color Doppler and Intracardiac            Opacification Agent Indications:    Dyspnea  History:        Patient has prior history of  Echocardiogram examinations. CHF                 and Cardiomyopathy, CAD, Defibrillator; Risk                 Factors:Hypertension, Diabetes and Dyslipidemia. Polysubstance                 abuse.  Sonographer:    Dustin Flock RDCS Referring  Phys: [address] IMPRESSIONS  1. Left ventricular ejection fraction, by estimation, is 30 to 35%. The left ventricle has moderately decreased function. The left ventricle demonstrates global hypokinesis. The left ventricular internal cavity size was mildly dilated. There is mild left ventricular hypertrophy. Left ventricular diastolic parameters are consistent with Grade II diastolic dysfunction (pseudonormalization). Elevated left ventricular end-diastolic pressure.  2. Right ventricular systolic function is normal. The right ventricular size is normal. There is moderately elevated pulmonary artery systolic pressure.  3. Left atrial size was moderately dilated.  4. The mitral valve is normal in structure. Trivial mitral valve regurgitation. No evidence of mitral stenosis.  5. Tricuspid valve regurgitation is moderate.  6. The aortic valve is tricuspid. Aortic valve regurgitation is not visualized. No aortic stenosis is present.  7. The inferior vena cava is normal in size with greater than 50% respiratory variability, suggesting right atrial pressure of 3 mmHg. FINDINGS  Left Ventricle: EF similar to TTE 01/22/19. Left ventricular ejection fraction, by estimation, is 30 to 35%. The left ventricle has moderately decreased function. The left ventricle demonstrates global hypokinesis. Definity contrast agent was given IV to  delineate the left ventricular endocardial borders. The left ventricular internal cavity size was mildly dilated. There is mild left ventricular hypertrophy. Left ventricular diastolic parameters are consistent with Grade II diastolic dysfunction (pseudonormalization). Elevated left ventricular end-diastolic pressure. Right Ventricle: The right  ventricular size is normal. No increase in right ventricular wall thickness. Right ventricular systolic function is normal. There is moderately elevated pulmonary artery systolic pressure. The tricuspid regurgitant velocity is 3.44 m/s, and with an assumed right atrial pressure of 8 mmHg, the estimated right ventricular systolic pressure is 95.2 mmHg. Left Atrium: Left atrial size was moderately dilated. Right Atrium: Right atrial size was normal in size. Pericardium: There is no evidence of pericardial effusion. Mitral Valve: The mitral valve is normal in structure. Trivial mitral valve regurgitation. No evidence of mitral valve stenosis. Tricuspid Valve: The tricuspid valve is normal in structure. Tricuspid valve regurgitation is moderate . No evidence of tricuspid stenosis. Aortic Valve: The aortic valve is tricuspid. Aortic valve regurgitation is not visualized. No aortic stenosis is present. Pulmonic Valve: The pulmonic valve was normal in structure. Pulmonic valve regurgitation is not visualized. No evidence of pulmonic stenosis. Aorta: The aortic root is normal in size and structure. Venous: The inferior vena cava is normal in size with greater than 50% respiratory variability, suggesting right atrial pressure of 3 mmHg. IAS/Shunts: No atrial level shunt detected by color flow Doppler.  LEFT VENTRICLE PLAX 2D LVIDd:         6.07 cm  Diastology LVIDs:         5.11 cm  LV e' medial:    5.22 cm/s LV PW:         1.22 cm  LV E/e' medial:  20.1 LV IVS:        1.25 cm  LV e' lateral:   4.13 cm/s LVOT diam:     2.40 cm  LV E/e' lateral: 25.4 LV SV:         65 LV SV Index:   30 LVOT Area:     4.52 cm  RIGHT VENTRICLE RV Basal diam:  3.35 cm RV S prime:     11.90 cm/s TAPSE (M-mode): 2.4 cm LEFT ATRIUM              Index       RIGHT ATRIUM  Index LA diam:        5.10 cm  2.36 cm/m  RA Area:     23.30 cm LA Vol (A2C):   105.0 ml 48.58 ml/m RA Volume:   75.90 ml  35.12 ml/m LA Vol (A4C):   69.6 ml  32.20  ml/m LA Biplane Vol: 85.8 ml  39.70 ml/m  AORTIC VALVE LVOT Vmax:   75.10 cm/s LVOT Vmean:  50.000 cm/s LVOT VTI:    0.143 m  AORTA Ao Root diam: 2.60 cm MITRAL VALVE                TRICUSPID VALVE MV Area (PHT): 5.23 cm     TR Peak grad:   47.3 mmHg MV Decel Time: 145 msec     TR Vmax:        344.00 cm/s MV E velocity: 105.00 cm/s MV A velocity: 57.80 cm/s   SHUNTS MV E/A ratio:  1.82         Systemic VTI:  0.14 m                             Systemic Diam: 2.40 cm Jenkins Rouge MD Electronically signed by Jenkins Rouge MD Signature Date/Time: 02/02/2020/11:33:09 AM    Final         Scheduled Meds: . aspirin EC  81 mg Oral Daily  . carvedilol  6.25 mg Oral BID WC  . furosemide  60 mg Intravenous BID  . hydrALAZINE  100 mg Oral Q8H  . icosapent Ethyl  2 g Oral BID  . insulin aspart  0-15 Units Subcutaneous TID WC  . insulin aspart  0-5 Units Subcutaneous QHS  . insulin glargine  30 Units Subcutaneous BID AC  . isosorbide mononitrate  120 mg Oral Daily  . nabumetone  1,000 mg Oral BID  . pantoprazole  40 mg Oral Daily  . ranolazine  500 mg Oral BID  . rivaroxaban  2.5 mg Oral BID  . rosuvastatin  10 mg Oral Daily  . sodium chloride flush  3 mL Intravenous Q12H   Continuous Infusions: . sodium chloride       LOS: 1 day    Time spent: 36mins    Kathie Dike, MD [facility]   If 7PM-7AM, please contact night-coverage [email or web address]  02/02/2020, 5:51 PM

## 2020-02-03 LAB — GLUCOSE, CAPILLARY
Glucose-Capillary: 146 mg/dL — ABNORMAL HIGH (ref 70–99)
Glucose-Capillary: 33 mg/dL — CL (ref 70–99)
Glucose-Capillary: 177 mg/dL — ABNORMAL HIGH (ref 70–99)
Glucose-Capillary: 231 mg/dL — ABNORMAL HIGH (ref 70–99)
Glucose-Capillary: 192 mg/dL — ABNORMAL HIGH (ref 70–99)

## 2020-02-03 LAB — BASIC METABOLIC PANEL
Anion gap: 8 (ref 5–15)
BUN: 58 mg/dL — ABNORMAL HIGH (ref 8–23)
CO2: 22 mmol/L (ref 22–32)
Calcium: 8.1 mg/dL — ABNORMAL LOW (ref 8.9–10.3)
Chloride: 107 mmol/L (ref 98–111)
Creatinine, Ser: 2.81 mg/dL — ABNORMAL HIGH (ref 0.61–1.24)
GFR, Estimated: 24 mL/min — ABNORMAL LOW (ref >60)
Glucose, Bld: 39 mg/dL — CL (ref 70–99)
Potassium: 4 mmol/L (ref 3.5–5.1)
Sodium: 137 mmol/L (ref 135–145)

## 2020-02-03 MED ORDER — PNEUMOCOCCAL VAC POLYVALENT 25 MCG/0.5ML IJ INJ
0.5000 mL | INJECTION | INTRAMUSCULAR | Status: AC
  Administered 2020-02-04: 11:00:00 0.5 mL via INTRAMUSCULAR
  Filled 2020-02-03: qty 0.5

## 2020-02-03 MED ORDER — DEXTROSE 50 % IV SOLN
1.0000 | Freq: Once | INTRAVENOUS | Status: AC
  Administered 2020-02-03: 09:00:00 50 mL via INTRAVENOUS
  Filled 2020-02-03: qty 50

## 2020-02-03 MED ORDER — INSULIN GLARGINE 100 UNIT/ML ~~LOC~~ SOLN
15.0000 [IU] | Freq: Two times a day (BID) | SUBCUTANEOUS | Status: DC
  Administered 2020-02-03 – 2020-02-04 (×3): 15 [IU] via SUBCUTANEOUS
  Filled 2020-02-03 (×6): qty 0.15

## 2020-02-03 NOTE — Progress Notes (Addendum)
PROGRESS NOTE    CASSELL VOORHIES  WHQ:[id number] DOB: [birth year] DOA: 02/01/2020 PCP: [name], MD    Brief Narrative:  64 year old male with a history of cardiomyopathy, ejection fraction of 30 to 35%, coronary artery disease, chronic kidney disease stage IV, presents to the hospital with volume overload.  He was noted to have significant weight gain and worsening lower extremity edema.  His diuretics were being managed as an outpatient, but despite adjusting torsemide dose, he continued to retain fluid.  He was admitted for IV diuresis.   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  Weight on admission 221 pounds -Further daily weights have not been documented, we will ask staff to check daily weights -Continue on Coreg, hydralazine, Imdur, not on Entresto due to renal insufficiency -With low EF and rising creatinine, will consult cardiology to help with further diuresis  History of coronary artery disease -Status post DES to proximal RCA in 02/2018, on Xarelto 2.5 mg twice daily, aspirin, Coreg, Ranexa, Imdur as part of the [facility] protocol  Hyperlipidemia.   -Continue statin  Chronic kidney disease stage IV -Baseline creatinine approximately 2.5 -Creatinine  trending up with diuresis, currently 2.8 -Continue to monitor in setting of diuresis  Diabetes, type II, insulin-dependent -Currently on Lantus and sliding scale insulin -Patient did have an episode of hypoglycemia this morning -Basal insulin has been reduced   DVT prophylaxis:  rivaroxaban (XARELTO) tablet 2.5 mg  Code Status: Full code Family Communication: Discussed with patient Disposition Plan: Status is: Inpatient  Remains inpatient appropriate because:IV treatments appropriate due to intensity of illness or inability to take PO   Dispo: The patient is from: Home              Anticipated d/c is to: Home              Anticipated d/c date is: 3 days              Patient currently is not medically stable to d/c.    Consultants:     Procedures:   Echo: EF 30 to 35%, global hypokinesis, grade 2 diastolic dysfunction  Antimicrobials:       Subjective: Describe some orthopnea last night, had to sit up.  Continues to have swelling in lower extremities  Objective: Vitals:   02/02/20 2100 02/03/20 0454 02/03/20 0900 02/03/20 1314  BP: (!) 149/90 (!) 156/86  (!) 144/80  Pulse: 76 68 80 78  Resp: 20 18  18   Temp: 98.4 F (36.9 C) 97.6 F (36.4 C)  98 F (36.7 C)  TempSrc: Oral Oral  Oral  SpO2: 99% 100%  97%  Weight:      Height:        Intake/Output Summary (Last 24 hours) at 02/03/2020 1426 Last data filed at 02/03/2020  1218 Gross per 24 hour  Intake --  Output 1400 ml  Net -1400 ml   Filed Weights   02/01/20 1505 02/01/20 1949  Weight: 105.2 kg 100.9 kg    Examination:  General exam: Alert, awake, oriented x 3 Respiratory system: crackles at bases. Respiratory effort normal. Cardiovascular system:RRR. No murmurs, rubs, gallops. Gastrointestinal system: Abdomen is nondistended, soft and nontender. No organomegaly or masses felt. Normal bowel sounds heard. Central nervous system: Alert and oriented. No focal neurological deficits. Extremities: 2+  edema bilaterally Skin: No rashes, lesions or ulcers Psychiatry: Judgement and insight appear normal. Mood & affect appropriate.      Data Reviewed: I have personally reviewed following labs and imaging studies  CBC: Recent Labs  Lab 02/01/20 1848  WBC 6.6  NEUTROABS 4.5  HGB 10.4*  HCT 33.8*  MCV 83.9  PLT 024   Basic Metabolic Panel: Recent Labs  Lab 02/01/20 1848 02/02/20 0714 02/03/20 0610  NA 139 137 137  K 4.4 4.0 4.0  CL 109 109 107  CO2 23 21* 22  GLUCOSE 199* 100* 39*  BUN 58* 54* 58*  CREATININE 2.61* 2.50* 2.81*  CALCIUM 8.4* 8.1* 8.1*   GFR: Estimated Creatinine Clearance: 31.5 mL/min (A) (by C-G formula based on SCr of 2.81 mg/dL (H)). Liver Function Tests: Recent Labs  Lab 02/01/20 1848  AST 30  ALT 31  ALKPHOS 105  BILITOT 0.4  PROT 5.6*  ALBUMIN 2.5*   No results for input(s): LIPASE, AMYLASE in the last 168 hours. No results for input(s): AMMONIA in the last 168 hours. Coagulation Profile: No results for input(s): INR, PROTIME in the last 168 hours. Cardiac Enzymes: No results for input(s): CKTOTAL, CKMB, CKMBINDEX, TROPONINI in the last 168 hours. BNP (last 3 results) No results for input(s): PROBNP in the last 8760 hours. HbA1C: Recent Labs    02/01/20 1848  HGBA1C 8.1*   CBG: Recent Labs  Lab 02/02/20 1704 02/02/20 2059 02/03/20 0838 02/03/20 0924 02/03/20 1144  GLUCAP 85 96 33* 146* 177*   Lipid Profile: No results for input(s): CHOL, HDL, LDLCALC, TRIG, CHOLHDL, LDLDIRECT in the last 72 hours. Thyroid Function Tests: No results for input(s): TSH, T4TOTAL, FREET4, T3FREE, THYROIDAB in the last 72 hours. Anemia Panel: No results for input(s): VITAMINB12, FOLATE, FERRITIN, TIBC, IRON, RETICCTPCT in the last 72 hours. Sepsis Labs: No results for input(s): PROCALCITON, LATICACIDVEN in the last 168 hours.  Recent Results (from the past 240 hour(s))  Resp Panel by RT-PCR (Flu A&B, Covid) Nasopharyngeal Swab     Status:  None   Collection Time: 02/01/20  7:44 PM   Specimen: Nasopharyngeal Swab; Nasopharyngeal(NP) swabs in vial transport medium  Result Value Ref Range Status   SARS Coronavirus 2 by RT PCR NEGATIVE NEGATIVE Final    Comment: (NOTE) SARS-CoV-2 target nucleic acids are NOT DETECTED.  The SARS-CoV-2 RNA is generally detectable in upper respiratory specimens during the acute phase of infection. The lowest concentration of SARS-CoV-2 viral copies this assay can detect is 138 copies/mL. A negative result does not preclude SARS-Cov-2 infection and should not be used as the sole basis for treatment or other patient management decisions. A negative result may occur with  improper specimen collection/handling, submission of specimen other than nasopharyngeal swab, presence of viral mutation(s) within the areas targeted by this assay, and inadequate number of viral copies(<138 copies/mL). A negative result must be combined with clinical observations, patient history, and epidemiological information. The expected result is Negative.  Fact Sheet  for Patients:  EntrepreneurPulse.com.au  Fact Sheet for Healthcare Providers:  IncredibleEmployment.be  This test is no t yet approved or cleared by the [place] FDA and  has been authorized for detection and/or diagnosis of SARS-CoV-2 by FDA under an Emergency Use Authorization (EUA). This EUA will remain  in effect (meaning this test can be used) for the duration of the COVID-19 declaration under Section 564(b)(1) of the Act, 21 U.S.C.section 360bbb-3(b)(1), unless the authorization is terminated  or revoked sooner.       Influenza A by PCR NEGATIVE NEGATIVE Final   Influenza B by PCR NEGATIVE NEGATIVE Final    Comment: (NOTE) The Xpert Xpress SARS-CoV-2/FLU/RSV plus assay is intended as an aid in the diagnosis of influenza from Nasopharyngeal swab specimens and should not be used as a sole basis for  treatment. Nasal washings and aspirates are unacceptable for Xpert Xpress SARS-CoV-2/FLU/RSV testing.  Fact Sheet for Patients: EntrepreneurPulse.com.au  Fact Sheet for Healthcare Providers: IncredibleEmployment.be  This test is not yet approved or cleared by the [place] FDA and has been authorized for detection and/or diagnosis of SARS-CoV-2 by FDA under an Emergency Use Authorization (EUA). This EUA will remain in effect (meaning this test can be used) for the duration of the COVID-19 declaration under Section 564(b)(1) of the Act, 21 U.S.C. section 360bbb-3(b)(1), unless the authorization is terminated or revoked.  Performed at [facility], [address], [place], [place] [address]          Radiology Studies: [facility] 1 View  Result Date: 02/01/2020 CLINICAL DATA:  Shortness of breath, bilateral lower leg swelling. Coronary artery disease. EXAM: PORTABLE CHEST 1 VIEW COMPARISON:  Chest x-ray 12/09/2019 FINDINGS: Redemonstration of a left chest wall ICD implant in stable position. The heart size and mediastinal contours are unchanged. Persistent interstitial coarsening. Slightly decreased inspiratory effort compared to prior. Suggestion of vague patchy airspace opacities within bilateral lower lung zones. Mild pulmonary edema. No pleural effusion. No pneumothorax. No acute osseous abnormality. IMPRESSION: 1. Vague patchy airspace opacities within bilateral lower lung zones could represent atelectasis versus infection/inflammation. 2. Mild pulmonary edema. Electronically Signed   By: Iven Finn M.D.   On: 02/01/2020 20:14   ECHOCARDIOGRAM COMPLETE  Result Date: 02/02/2020    ECHOCARDIOGRAM REPORT   Patient Name:   JIMIE KUWAHARA Yohannes Date of Exam: 02/02/2020 Medical Rec #:  [id number]       Height:       69.0 in Accession #:    [id number]      Weight:       222.4 lb Date of Birth:  1955-10-18        BSA:          2.161 m Patient Age:     24 years        BP:           148/93 mmHg Patient Gender: M               HR:           82 bpm. Exam Location:  [facility] Procedure: 2D Echo, Cardiac Doppler, Color Doppler and Intracardiac            Opacification Agent Indications:    Dyspnea  History:        Patient has prior history of Echocardiogram examinations. CHF                 and Cardiomyopathy, CAD, Defibrillator; Risk  Factors:Hypertension, Diabetes and Dyslipidemia. Polysubstance                 abuse.  Sonographer:    Dustin Flock RDCS Referring Phys: [place]  1. Left ventricular ejection fraction, by estimation, is 30 to 35%. The left ventricle has moderately decreased function. The left ventricle demonstrates global hypokinesis. The left ventricular internal cavity size was mildly dilated. There is mild left ventricular hypertrophy. Left ventricular diastolic parameters are consistent with Grade II diastolic dysfunction (pseudonormalization). Elevated left ventricular end-diastolic pressure.  2. Right ventricular systolic function is normal. The right ventricular size is normal. There is moderately elevated pulmonary artery systolic pressure.  3. Left atrial size was moderately dilated.  4. The mitral valve is normal in structure. Trivial mitral valve regurgitation. No evidence of mitral stenosis.  5. Tricuspid valve regurgitation is moderate.  6. The aortic valve is tricuspid. Aortic valve regurgitation is not visualized. No aortic stenosis is present.  7. The inferior vena cava is normal in size with greater than 50% respiratory variability, suggesting right atrial pressure of 3 mmHg. FINDINGS  Left Ventricle: EF similar to TTE 01/22/19. Left ventricular ejection fraction, by estimation, is 30 to 35%. The left ventricle has moderately decreased function. The left ventricle demonstrates global hypokinesis. Definity contrast agent was given IV to  delineate the left ventricular endocardial borders.  The left ventricular internal cavity size was mildly dilated. There is mild left ventricular hypertrophy. Left ventricular diastolic parameters are consistent with Grade II diastolic dysfunction (pseudonormalization). Elevated left ventricular end-diastolic pressure. Right Ventricle: The right ventricular size is normal. No increase in right ventricular wall thickness. Right ventricular systolic function is normal. There is moderately elevated pulmonary artery systolic pressure. The tricuspid regurgitant velocity is 3.44 m/s, and with an assumed right atrial pressure of 8 mmHg, the estimated right ventricular systolic pressure is 64.4 mmHg. Left Atrium: Left atrial size was moderately dilated. Right Atrium: Right atrial size was normal in size. Pericardium: There is no evidence of pericardial effusion. Mitral Valve: The mitral valve is normal in structure. Trivial mitral valve regurgitation. No evidence of mitral valve stenosis. Tricuspid Valve: The tricuspid valve is normal in structure. Tricuspid valve regurgitation is moderate . No evidence of tricuspid stenosis. Aortic Valve: The aortic valve is tricuspid. Aortic valve regurgitation is not visualized. No aortic stenosis is present. Pulmonic Valve: The pulmonic valve was normal in structure. Pulmonic valve regurgitation is not visualized. No evidence of pulmonic stenosis. Aorta: The aortic root is normal in size and structure. Venous: The inferior vena cava is normal in size with greater than 50% respiratory variability, suggesting right atrial pressure of 3 mmHg. IAS/Shunts: No atrial level shunt detected by color flow Doppler.  LEFT VENTRICLE PLAX 2D LVIDd:         6.07 cm  Diastology LVIDs:         5.11 cm  LV e' medial:    5.22 cm/s LV PW:         1.22 cm  LV E/e' medial:  20.1 LV IVS:        1.25 cm  LV e' lateral:   4.13 cm/s LVOT diam:     2.40 cm  LV E/e' lateral: 25.4 LV SV:         65 LV SV Index:   30 LVOT Area:     4.52 cm  RIGHT VENTRICLE RV Basal  diam:  3.35 cm RV S prime:     11.90 cm/s  TAPSE (M-mode): 2.4 cm LEFT ATRIUM              Index       RIGHT ATRIUM           Index LA diam:        5.10 cm  2.36 cm/m  RA Area:     23.30 cm LA Vol (A2C):   105.0 ml 48.58 ml/m RA Volume:   75.90 ml  35.12 ml/m LA Vol (A4C):   69.6 ml  32.20 ml/m LA Biplane Vol: 85.8 ml  39.70 ml/m  AORTIC VALVE LVOT Vmax:   75.10 cm/s LVOT Vmean:  50.000 cm/s LVOT VTI:    0.143 m  AORTA Ao Root diam: 2.60 cm MITRAL VALVE                TRICUSPID VALVE MV Area (PHT): 5.23 cm     TR Peak grad:   47.3 mmHg MV Decel Time: 145 msec     TR Vmax:        344.00 cm/s MV E velocity: 105.00 cm/s MV A velocity: 57.80 cm/s   SHUNTS MV E/A ratio:  1.82         Systemic VTI:  0.14 m                             Systemic Diam: 2.40 cm Jenkins Rouge MD Electronically signed by Jenkins Rouge MD Signature Date/Time: 02/02/2020/11:33:09 AM    Final         Scheduled Meds: . aspirin EC  81 mg Oral Daily  . carvedilol  6.25 mg Oral BID WC  . furosemide  60 mg Intravenous BID  . hydrALAZINE  100 mg Oral Q8H  . icosapent Ethyl  2 g Oral BID  . insulin aspart  0-15 Units Subcutaneous TID WC  . insulin aspart  0-5 Units Subcutaneous QHS  . insulin glargine  15 Units Subcutaneous BID AC  . isosorbide mononitrate  120 mg Oral Daily  . pantoprazole  40 mg Oral Daily  . [START ON 02/04/2020] pneumococcal 23 valent vaccine  0.5 mL Intramuscular Tomorrow-1000  . ranolazine  500 mg Oral BID  . rivaroxaban  2.5 mg Oral BID  . rosuvastatin  10 mg Oral Daily  . sodium chloride flush  3 mL Intravenous Q12H   Continuous Infusions: . sodium chloride       LOS: 2 days    Time spent: 20mins    Kathie Dike, MD [facility]   If 7PM-7AM, please contact night-coverage [email or web address]  02/03/2020, 2:26 PM

## 2020-02-03 NOTE — TOC Initial Note (Signed)
Transition of Care [facility]) - Initial/Assessment Note    Patient Details  Name: Brad Mendoza MRN: [id number] Date of Birth: [birth year]  [facility] CM/SW Contact:    Salome Arnt, LCSW Phone Number: 02/03/2020, 8:49 AM  Clinical Narrative:   Pt admitted due to acute on chronic systolic CHF.  Pt reports he lives with his wife. He indicates he is independent with ADLs at baseline. Pt plans to return home when medically stable. TOC consulted for CHF screening. Pt states he does not weigh himself daily, but a few times a week. He reports understanding importance of daily weights. Pt said he takes his medications as prescribed and is followed by Dr. Domenic Polite. When discussing his diet, pt admits that this is an area he needs to work on and plans to talk to cardiology about recommendations for outpatient follow up regarding diet. He said his wife does most of the cooking at home. No needs reported at this time. TOC will continue to follow.                Expected Discharge Plan: Home/Self Care Barriers to Discharge: Continued Medical Work up   Patient Goals and CMS Choice Patient states their goals for this hospitalization and ongoing recovery are:: return home      Expected Discharge Plan and Services Expected Discharge Plan: Home/Self Care In-house Referral: Clinical Social Work     Living arrangements for the past 2 months: Single Family Home                 DME Arranged: N/A         HH Arranged: NA          Prior Living Arrangements/Services Living arrangements for the past 2 months: Single Family Home Lives with:: Spouse Patient language and need for interpreter reviewed:: Yes Do you feel safe going back to the place where you live?: Yes      Need for Family Participation in Patient Care: No (Comment)     Criminal Activity/Legal Involvement Pertinent to Current Situation/Hospitalization: No - Comment as needed  Activities of Daily Living Home  Assistive Devices/Equipment: Dentures (specify type) (lower/upper) ADL Screening (condition at time of admission) Patient's cognitive ability adequate to safely complete daily activities?: Yes Is the patient deaf or have difficulty hearing?: No Does the patient have difficulty seeing, even when wearing glasses/contacts?: No Does the patient have difficulty concentrating, remembering, or making decisions?: No Patient able to express need for assistance with ADLs?: No Does the patient have difficulty dressing or bathing?: No Independently performs ADLs?: Yes (appropriate for developmental age) Does the patient have difficulty walking or climbing stairs?: No Weakness of Legs: Both Weakness of Arms/Hands: None  Permission Sought/Granted                  Emotional Assessment   Attitude/Demeanor/Rapport: Engaged Affect (typically observed): Accepting Orientation: : Oriented to Self,Oriented to Place,Oriented to  Time,Oriented to Situation Alcohol / Substance Use: Not Applicable Psych Involvement: No (comment)  Admission diagnosis:  Acute diastolic congestive heart failure (HCC) [I50.31] Acute on chronic systolic CHF (congestive heart failure) ([place]) [I50.23] Patient Active Problem List   Diagnosis Date Noted  . Acute on chronic systolic CHF (congestive heart failure) ([place]) 02/01/2020  . Lumbar radiculopathy 03/22/2019  . Imbalance 03/22/2019  . Chronic kidney disease, stage IV (severe) ([place]) 02/11/2019  . Acute on chronic systolic (congestive) heart failure ([place]) 03/19/2018  . 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 Onychomycosis 05/23/2017  .  Acid reflux 05/23/2017  . Cardiomyopathy ([place]) 05/23/2017  . Anginal pain ([place]) 06/01/2016  . Angina pectoris ([place]) 06/01/2016  . CHF exacerbation ([place]) 11/17/2015  . Congestive heart failure ([place]) 11/17/2015  . Chronic combined systolic and diastolic heart failure ([place]) 11/16/2015  . Type 2 diabetes mellitus with hyperosmolar nonketotic hyperglycemia  ([place]) 05/08/2015  . Hyponatremia 05/08/2015  . Hypochloremia 05/08/2015  . Type 2 diabetes mellitus with other specified complication ([place]) 85/88/5027  . Type 2 diabetes mellitus with diabetic polyneuropathy ([place]) 12/16/2013  . Type 2 diabetes mellitus ([place]) 12/16/2013  . Coronary artery disease involving native coronary artery of native heart with angina pectoris ([place]) 09/18/2013  . Coronary arteriosclerosis 09/18/2013  . Heme positive stool 06/12/2013  . Occult blood in stools 06/12/2013  . Seasonal allergic rhinitis 07/11/2012  . Polysubstance abuse ([place]) 12/24/2010  . Nonischemic cardiomyopathy ([place])   . 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 Hyperlipemia 11/16/2009  . Obesity 11/16/2009  . Tobacco dependence 11/16/2009  . Essential hypertension 11/16/2009  . Hyperlipidemia 11/16/2009  . Tobacco dependence syndrome 11/16/2009   PCP:  [name], MD Pharmacy:   [facility], [place] [address] Phone: [phone] Fax: [phone]  [facility], [place] [address] [place] [address] Phone: [phone] Fax: [phone]     Social Determinants of Health (SDOH) Interventions    Readmission Risk Interventions Readmission Risk Prevention Plan 02/03/2020  Transportation Screening Complete  HRI or Home Care Consult Complete  Social Work Consult for [place] Planning/Counseling Complete  Palliative Care Screening Not Applicable  Medication Review Press photographer) Complete  Some recent data might be hidden

## 2020-02-03 NOTE — Progress Notes (Signed)
CRITICAL VALUE ALERT  Critical Value:  Glucose- 39  Date & Time Notied:  02/03/20 5035  Provider Notified: Roderic Palau;  02/03/20  [id number]  Orders Received/Actions taken: awaiting response

## 2020-02-04 DIAGNOSIS — I2510 Atherosclerotic heart disease of native coronary artery without angina pectoris: Secondary | ICD-10-CM

## 2020-02-04 LAB — BASIC METABOLIC PANEL
Anion gap: 7 (ref 5–15)
BUN: 61 mg/dL — ABNORMAL HIGH (ref 8–23)
CO2: 24 mmol/L (ref 22–32)
Calcium: 7.9 mg/dL — ABNORMAL LOW (ref 8.9–10.3)
Chloride: 107 mmol/L (ref 98–111)
Creatinine, Ser: 2.85 mg/dL — ABNORMAL HIGH (ref 0.61–1.24)
GFR, Estimated: 24 mL/min — ABNORMAL LOW (ref >60)
Glucose, Bld: 76 mg/dL (ref 70–99)
Potassium: 3.9 mmol/L (ref 3.5–5.1)
Sodium: 138 mmol/L (ref 135–145)

## 2020-02-04 LAB — GLUCOSE, CAPILLARY
Glucose-Capillary: 64 mg/dL — ABNORMAL LOW (ref 70–99)
Glucose-Capillary: 95 mg/dL (ref 70–99)
Glucose-Capillary: 240 mg/dL — ABNORMAL HIGH (ref 70–99)
Glucose-Capillary: 131 mg/dL — ABNORMAL HIGH (ref 70–99)
Glucose-Capillary: 148 mg/dL — ABNORMAL HIGH (ref 70–99)

## 2020-02-04 MED ORDER — FUROSEMIDE 10 MG/ML IJ SOLN
20.0000 mg | Freq: Once | INTRAMUSCULAR | Status: AC
  Administered 2020-02-04: 10:00:00 20 mg via INTRAVENOUS
  Filled 2020-02-04: qty 2

## 2020-02-04 MED ORDER — FUROSEMIDE 10 MG/ML IJ SOLN
80.0000 mg | Freq: Two times a day (BID) | INTRAMUSCULAR | Status: DC
  Administered 2020-02-04 – 2020-02-05 (×3): 80 mg via INTRAVENOUS
  Filled 2020-02-04 (×3): qty 8

## 2020-02-04 NOTE — Progress Notes (Signed)
PROGRESS NOTE    Brad Mendoza  HEN:[id number] DOB: [birth year] DOA: 02/01/2020 PCP: [name], MD    Brief Narrative:  64 year old male with a history of cardiomyopathy, ejection fraction of 30 to 35%, coronary artery disease, chronic kidney disease stage IV, presents to the hospital with volume overload.  He was noted to have significant weight gain and worsening lower extremity edema.  His diuretics were being managed as an outpatient, but despite adjusting torsemide dose, he continued to retain fluid.  He was admitted for IV diuresis.   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 ([place])   Coronary artery disease involving native coronary artery of native heart with angina pectoris (HCC)   Acute on chronic systolic (congestive) heart failure (HCC)   Cardiomyopathy (HCC)   Chronic kidney disease, stage IV (severe) (HCC)   Acute on chronic systolic CHF (congestive heart failure) (HCC)   Acute on chronic combined CHF -EF 30 to 35%, status post AICD -Patient reports compliance with medication, though he may have had some dietary indiscretions -Continue intravenous Lasix -Patient had approximately 1675cc of urine output yesterday -Dry weight is approximately 207 pounds.  Weight on admission 221 pounds -Further daily weights have not been documented, we will ask staff to check daily weights -Continue on Coreg, hydralazine, Imdur, not on Entresto due to renal insufficiency -Cardiology following, appreciate input  History of coronary artery disease -Status post DES to proximal RCA in 02/2018, on Xarelto 2.5 mg twice daily, aspirin, Coreg, Ranexa, Imdur as part of the [facility] protocol  Hyperlipidemia.   -Continue statin  Chronic kidney disease stage IV -Baseline creatinine approximately 2.5 -Creatinine trending up with diuresis, currently 2.8 -Continue  to monitor in setting of diuresis -We will likely need to tolerate a higher creatinine to achieve diuresis  Diabetes, type II, insulin-dependent -Currently on Lantus and sliding scale insulin -Blood sugars currently stable.  Continue to monitor   DVT prophylaxis:  rivaroxaban (XARELTO) tablet 2.5 mg  Code Status: Full code Family Communication: Discussed with patient Disposition Plan: Status is: Inpatient  Remains inpatient appropriate because:IV treatments appropriate due to intensity of illness or inability to take PO   Dispo: The patient is from: Home              Anticipated d/c is to: Home              Anticipated d/c date is: 3 days              Patient currently is not medically stable to d/c.    Consultants:   Cardiology  Procedures:   Echo: EF 30 to 35%, global hypokinesis, grade 2 diastolic dysfunction  Antimicrobials:       Subjective: Complains of having difficulty breathing while laying down, has to sit up  Objective: Vitals:   02/03/20 2042 02/04/20 0520 02/04/20 1353 02/04/20 2021  BP: (!) 144/83 134/85 (!) 169/86 (!) 154/94  Pulse: 75 77 80 79  Resp: 18 20 20 19   Temp: 98.2 F (36.8 C) 98.5 F (36.9 C) 98 F (36.7 C) 98.8 F (37.1 C)  TempSrc: Oral Oral Oral   SpO2: 100% 100% 99% 98%  Weight:      Height:        Intake/Output Summary (Last 24 hours) at 02/04/2020 2137 Last data filed at 02/04/2020 1500 Gross per 24 hour  Intake  243 ml  Output 375 ml  Net -132 ml   Filed Weights   02/01/20 1505 02/01/20 1949  Weight: 105.2 kg 100.9 kg    Examination:  General exam: Alert, awake, oriented x 3 Respiratory system: Clear to auscultation. Respiratory effort normal. Cardiovascular system:RRR. No murmurs, rubs, gallops. Gastrointestinal system: Abdomen is nondistended, soft and nontender. No organomegaly or masses felt. Normal bowel sounds heard. Central nervous system: Alert and oriented. No focal neurological deficits. Extremities:  1-2+ pitting edema bilaterally Skin: No rashes, lesions or ulcers Psychiatry: Judgement and insight appear normal. Mood & affect appropriate.       Data Reviewed: I have personally reviewed following labs and imaging studies  CBC: Recent Labs  Lab 02/01/20 1848  WBC 6.6  NEUTROABS 4.5  HGB 10.4*  HCT 33.8*  MCV 83.9  PLT 616   Basic Metabolic Panel: Recent Labs  Lab 02/01/20 1848 02/02/20 0714 02/03/20 0610 02/04/20 0547  NA 139 137 137 138  K 4.4 4.0 4.0 3.9  CL 109 109 107 107  CO2 23 21* 22 24  GLUCOSE 199* 100* 39* 76  BUN 58* 54* 58* 61*  CREATININE 2.61* 2.50* 2.81* 2.85*  CALCIUM 8.4* 8.1* 8.1* 7.9*   GFR: Estimated Creatinine Clearance: 31.1 mL/min (A) (by C-G formula based on SCr of 2.85 mg/dL (H)). Liver Function Tests: Recent Labs  Lab 02/01/20 1848  AST 30  ALT 31  ALKPHOS 105  BILITOT 0.4  PROT 5.6*  ALBUMIN 2.5*   No results for input(s): LIPASE, AMYLASE in the last 168 hours. No results for input(s): AMMONIA in the last 168 hours. Coagulation Profile: No results for input(s): INR, PROTIME in the last 168 hours. Cardiac Enzymes: No results for input(s): CKTOTAL, CKMB, CKMBINDEX, TROPONINI in the last 168 hours. BNP (last 3 results) No results for input(s): PROBNP in the last 8760 hours. HbA1C: No results for input(s): HGBA1C in the last 72 hours. CBG: Recent Labs  Lab 02/04/20 0748 02/04/20 0822 02/04/20 1214 02/04/20 1638 02/04/20 2022  GLUCAP 64* 95 240* 131* 148*   Lipid Profile: No results for input(s): CHOL, HDL, LDLCALC, TRIG, CHOLHDL, LDLDIRECT in the last 72 hours. Thyroid Function Tests: No results for input(s): TSH, T4TOTAL, FREET4, T3FREE, THYROIDAB in the last 72 hours. Anemia Panel: No results for input(s): VITAMINB12, FOLATE, FERRITIN, TIBC, IRON, RETICCTPCT in the last 72 hours. Sepsis Labs: No results for input(s): PROCALCITON, LATICACIDVEN in the last 168 hours.  Recent Results (from the past 240 hour(s))   Resp Panel by RT-PCR (Flu A&B, Covid) Nasopharyngeal Swab     Status: None   Collection Time: 02/01/20  7:44 PM   Specimen: Nasopharyngeal Swab; Nasopharyngeal(NP) swabs in vial transport medium  Result Value Ref Range Status   SARS Coronavirus 2 by RT PCR NEGATIVE NEGATIVE Final    Comment: (NOTE) SARS-CoV-2 target nucleic acids are NOT DETECTED.  The SARS-CoV-2 RNA is generally detectable in upper respiratory specimens during the acute phase of infection. The lowest concentration of SARS-CoV-2 viral copies this assay can detect is 138 copies/mL. A negative result does not preclude SARS-Cov-2 infection and should not be used as the sole basis for treatment or other patient management decisions. A negative result may occur with  improper specimen collection/handling, submission of specimen other than nasopharyngeal swab, presence of viral mutation(s) within the areas targeted by this assay, and inadequate number of viral copies(<138 copies/mL). A negative result must be combined with clinical observations, patient history, and epidemiological information. The expected result is  Negative.  Fact Sheet for Patients:  EntrepreneurPulse.com.au  Fact Sheet for Healthcare Providers:  IncredibleEmployment.be  This test is no t yet approved or cleared by the [place] FDA and  has been authorized for detection and/or diagnosis of SARS-CoV-2 by FDA under an Emergency Use Authorization (EUA). This EUA will remain  in effect (meaning this test can be used) for the duration of the COVID-19 declaration under Section 564(b)(1) of the Act, 21 U.S.C.section 360bbb-3(b)(1), unless the authorization is terminated  or revoked sooner.       Influenza A by PCR NEGATIVE NEGATIVE Final   Influenza B by PCR NEGATIVE NEGATIVE Final    Comment: (NOTE) The Xpert Xpress SARS-CoV-2/FLU/RSV plus assay is intended as an aid in the diagnosis of influenza from  Nasopharyngeal swab specimens and should not be used as a sole basis for treatment. Nasal washings and aspirates are unacceptable for Xpert Xpress SARS-CoV-2/FLU/RSV testing.  Fact Sheet for Patients: EntrepreneurPulse.com.au  Fact Sheet for Healthcare Providers: IncredibleEmployment.be  This test is not yet approved or cleared by the [place] FDA and has been authorized for detection and/or diagnosis of SARS-CoV-2 by FDA under an Emergency Use Authorization (EUA). This EUA will remain in effect (meaning this test can be used) for the duration of the COVID-19 declaration under Section 564(b)(1) of the Act, 21 U.S.C. section 360bbb-3(b)(1), unless the authorization is terminated or revoked.  Performed at [facility], [address] [place], [place] [address]          Radiology Studies: No results found.      Scheduled Meds: . aspirin EC  81 mg Oral Daily  . carvedilol  6.25 mg Oral BID WC  . furosemide  80 mg Intravenous BID  . hydrALAZINE  100 mg Oral Q8H  . icosapent Ethyl  2 g Oral BID  . insulin aspart  0-15 Units Subcutaneous TID WC  . insulin aspart  0-5 Units Subcutaneous QHS  . insulin glargine  15 Units Subcutaneous BID AC  . isosorbide mononitrate  120 mg Oral Daily  . pantoprazole  40 mg Oral Daily  . ranolazine  500 mg Oral BID  . rivaroxaban  2.5 mg Oral BID  . rosuvastatin  10 mg Oral Daily  . sodium chloride flush  3 mL Intravenous Q12H   Continuous Infusions: . sodium chloride       LOS: 3 days    Time spent: 31mins    Kathie Dike, MD [facility]   If 7PM-7AM, please contact night-coverage [email or web address]  02/04/2020, 9:37 PM

## 2020-02-04 NOTE — Consult Note (Signed)
Cardiology Consultation:   Patient ID: Brad Mendoza MRN: [id number]; DOB: 30-Apr-1955  Admit date: 02/01/2020 Date of Consult: 02/04/2020  Primary Care Provider: [name], MD [facility] Cardiologist: Brad Lesches, MD  Brad Mendoza:  None    Patient Profile:   Brad Mendoza is a 64 y.o. male with a hx of CAD and chronic sysotlic HFwho is being seen today for the evaluation of SOB at the request of Dr Brad Mendoza.  History of Present Illness:   Brad Mendoza 64 yo male history of CAD with prior DES to RCA in 2020, CKD IV, HTN, chronic systolic HF, ICD, DM2 admitted with edema and SOB. Weight up from 207 to 232 per H&P. There is mention or poor diuretic compliance. Reports weeks of progressing LE edema and SOB/DOE.    WBC 6.6 Hgb 10.4 Plt 284 K 4.4 Cr 2.61 BUN 58 BNP 472 Trop 41--> COVID neg CXR patch opacities, mild edema EKG SR, nonspecific ST/T changes 01/2020 echo: LVEF 30-35%, grade II DDx, normal RV  Past Medical History:  Diagnosis Date  . CAD (coronary artery disease)    DES to RCA January 2020  . Chronic back pain   . CKD (chronic kidney disease), stage IV ([place])   . Diverticulosis   . Essential hypertension   . GERD (gastroesophageal reflux disease)   . Hyperlipidemia   . ICD (implantable cardioverter-defibrillator) in place    [facility] - Dr. Lovena Le, implanted April 2021  . Onychomycosis   . Polysubstance abuse ([place])   . 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 Type 2 diabetes mellitus ([place])     Past Surgical History:  Procedure Laterality Date  . APPENDECTOMY    . CARDIAC CATHETERIZATION  2005   4 frech cath  . COLONOSCOPY N/A 08/01/2013   Procedure: COLONOSCOPY;  Surgeon: Rogene Houston, MD;  [facility];  Service: Endoscopy;  Laterality: N/A;  rescheduled to [place] notified pt  . CORONARY STENT INTERVENTION N/A 03/19/2018   Procedure: CORONARY STENT INTERVENTION;  Surgeon:  Leonie Man, MD;  Location: [place] CV LAB;  Service: Cardiovascular;  Laterality: N/A;  . ESOPHAGOGASTRODUODENOSCOPY N/A 04/23/2015   Procedure: ESOPHAGOGASTRODUODENOSCOPY (EGD);  Surgeon: Rogene Houston, MD;  [facility];  Service: Endoscopy;  Laterality: N/A;  1:25  . RIGHT/LEFT HEART CATH AND CORONARY ANGIOGRAPHY N/A 06/03/2016   Procedure: Right/Left Heart Cath and Coronary Angiography;  Surgeon: Belva Crome, MD;  Location: [place] CV LAB;  Service: Cardiovascular;  Laterality: N/A;  . RIGHT/LEFT HEART CATH AND CORONARY ANGIOGRAPHY N/A 03/19/2018   Procedure: RIGHT/LEFT HEART CATH AND CORONARY ANGIOGRAPHY;  Surgeon: Jolaine Artist, MD;  Location: [place] CV LAB;  Service: Cardiovascular;  Laterality: N/A;  . SUBQ ICD IMPLANT N/A 05/27/2019   Procedure: SUBQ ICD IMPLANT;  Surgeon: Evans Lance, MD;  Location: [place] CV LAB;  Service: Cardiovascular;  Laterality: N/A;      Inpatient Medications: Scheduled Meds: . aspirin EC  81 mg Oral Daily  . carvedilol  6.25 mg Oral BID WC  . furosemide  60 mg Intravenous BID  . hydrALAZINE  100 mg Oral Q8H  . icosapent Ethyl  2 g Oral BID  . insulin aspart  0-15 Units Subcutaneous TID WC  . insulin aspart  0-5 Units Subcutaneous QHS  . insulin glargine  15 Units Subcutaneous BID AC  . isosorbide mononitrate  120 mg Oral Daily  . pantoprazole  40 mg Oral Daily  . pneumococcal 23 valent vaccine  0.5 mL Intramuscular Tomorrow-1000  . ranolazine  500 mg Oral BID  . rivaroxaban  2.5 mg Oral BID  . rosuvastatin  10 mg Oral Daily  . sodium chloride flush  3 mL Intravenous Q12H   Continuous Infusions: . sodium chloride     PRN Meds: sodium chloride, acetaminophen, nitroGLYCERIN, ondansetron (ZOFRAN) IV, sodium chloride flush  Allergies:    Allergies  Allergen Reactions  . Sulfa Antibiotics Shortness Of Breath  . Penicillins Rash and Other (See Comments)    Has patient had a PCN reaction causing immediate  rash, facial/tongue/throat swelling, SOB or lightheadedness with hypotension: No Has patient had a PCN reaction causing severe rash involving mucus membranes or skin necrosis: No Has patient had a PCN reaction that required hospitalization No Has patient had a PCN reaction occurring within the last 10 years: No If all of the above answers are "NO", then may proceed with Cephalosporin use.     Social History:   Social History   Socioeconomic History  . Marital status: Legally Separated    Spouse name: Not on file  . Number of children: 2  . Years of education: 4  . Highest education level: 4th grade  Occupational History  . Occupation: disabled  Tobacco Use  . Smoking status: Never Smoker  . Smokeless tobacco: Current User    Types: Snuff  . Tobacco comment: dips snuff about 6 cans/week for 3 years  Vaping Use  . Vaping Use: Never used  Substance and Sexual Activity  . Alcohol use: Yes    Alcohol/week: 1.0 standard drink    Types: 1 Cans of beer per week    Comment: occ - has cut down, now once every 2-3 months  . Drug use: Yes    Types: Marijuana    Comment: occasional use - smoking less  . Sexual activity: Not on file  Other Topics Concern  . Not on file  Social History Narrative   Lives in [place] with spouse   Disabled.   Social Determinants of Health   Financial Resource Strain: Not on file  Food Insecurity: Not on file  Transportation Needs: Unmet Transportation Needs  . Lack of Transportation (Medical): Yes  . Lack of Transportation (Non-Medical): No  Physical Activity: Inactive  . Days of Exercise per Week: 0 days  . Minutes of Exercise per Session: 0 min  Stress: Not on file  Social Connections: Unknown  . Frequency of Communication with Friends and Family: More than three times a week  . Frequency of Social Gatherings with Friends and Family: More than three times a week  . Attends Religious Services: 1 to 4 times per year  . Active Member of Clubs  or Organizations: No  . Attends Archivist Meetings: Never  . Marital Status: Patient refused  Intimate Partner Violence: Not on file    Family History:    Family History  Problem Relation Age of Onset  . Hypertension Father   . Diabetes Father   . Lung cancer Mother   . Alcohol abuse Brother   . Colon cancer Neg Hx      ROS:  Please see the history of present illness.  All other ROS reviewed and negative.     Physical Exam/Data:   Vitals:   02/03/20 1314 02/03/20 1623 02/03/20 2042 02/04/20 0520  BP: (!) 144/80 (!) 141/78 (!) 144/83 134/85  Pulse: 78 80 75 77  Resp:  18 18 18 20   Temp: 98 F (36.7 C) 98.1 F (36.7 C) 98.2 F (36.8 C) 98.5 F (36.9 C)  TempSrc: Oral  Oral Oral  SpO2: 97% 100% 100% 100%  Weight:      Height:        Intake/Output Summary (Last 24 hours) at 02/04/2020 0858 Last data filed at 02/04/2020 0813 Gross per 24 hour  Intake 243 ml  Output 1675 ml  Net -1432 ml   Last 3 Weights 02/01/2020 02/01/2020 01/21/2020  Weight (lbs) 222 lb 6.4 oz 232 lb 228 lb  Weight (kg) 100.88 kg 105.235 kg 103.42 kg     Body mass index is 32.84 kg/m.  General:  Well nourished, well developed, in no acute distress HEENT: normal Lymph: no adenopathy Neck: no JVD Endocrine:  No thryomegaly Vascular: No carotid bruits; FA pulses 2+ bilaterally without bruits  Cardiac:  normal S1, S2; RRR; no murmur  Lungs:  clear to auscultation bilaterally, no wheezing, rhonchi or rales  Abd: soft, nontender, no hepatomegaly  Ext: 1+bilateral LE edema Musculoskeletal:  No deformities, BUE and BLE strength normal and equal Skin: warm and dry  Neuro:  CNs 2-12 intact, no focal abnormalities noted Psych:  Normal affect    Laboratory Data:  High Sensitivity Troponin:   Recent Labs  Lab 02/01/20 1848  TROPONINIHS 41*     Chemistry Recent Labs  Lab 02/02/20 0714 02/03/20 0610 02/04/20 0547  NA 137 137 138  K 4.0 4.0 3.9  CL 109 107 107  CO2 21* 22 24   GLUCOSE 100* 39* 76  BUN 54* 58* 61*  CREATININE 2.50* 2.81* 2.85*  CALCIUM 8.1* 8.1* 7.9*  GFRNONAA 28* 24* 24*  ANIONGAP 7 8 7     Recent Labs  Lab 02/01/20 1848  PROT 5.6*  ALBUMIN 2.5*  AST 30  ALT 31  ALKPHOS 105  BILITOT 0.4   Hematology Recent Labs  Lab 02/01/20 1848  WBC 6.6  RBC 4.03*  HGB 10.4*  HCT 33.8*  MCV 83.9  MCH 25.8*  MCHC 30.8  RDW 16.3*  PLT 284   BNP Recent Labs  Lab 02/01/20 1848  BNP 472.0*    DDimer No results for input(s): DDIMER in the last 168 hours.   Radiology/Studies:  DG Chest Port 1 View  Result Date: 02/01/2020 CLINICAL DATA:  Shortness of breath, bilateral lower leg swelling. Coronary artery disease. EXAM: PORTABLE CHEST 1 VIEW COMPARISON:  Chest x-ray 12/09/2019 FINDINGS: Redemonstration of a left chest wall ICD implant in stable position. The heart size and mediastinal contours are unchanged. Persistent interstitial coarsening. Slightly decreased inspiratory effort compared to prior. Suggestion of vague patchy airspace opacities within bilateral lower lung zones. Mild pulmonary edema. No pleural effusion. No pneumothorax. No acute osseous abnormality. IMPRESSION: 1. Vague patchy airspace opacities within bilateral lower lung zones could represent atelectasis versus infection/inflammation. 2. Mild pulmonary edema. Electronically Signed   By: Iven Finn M.D.   On: 02/01/2020 20:14   ECHOCARDIOGRAM COMPLETE  Result Date: 02/02/2020    ECHOCARDIOGRAM REPORT   Patient Name:   Brad Mendoza [place] Date of Exam: 02/02/2020 Medical Rec #:  [id number]       Height:       69.0 in Accession #:    [id number]      Weight:       222.4 lb Date of Birth:  1955-05-18        BSA:          2.161 m Patient  Age:    15 years        BP:           148/93 mmHg Patient Gender: M               HR:           82 bpm. Exam Location:  [facility] Procedure: 2D Echo, Cardiac Doppler, Color Doppler and Intracardiac            Opacification Agent Indications:     Dyspnea  History:        Patient has prior history of Echocardiogram examinations. CHF                 and Cardiomyopathy, CAD, Defibrillator; Risk                 Factors:Hypertension, Diabetes and Dyslipidemia. Polysubstance                 abuse.  Sonographer:    Dustin Flock RDCS Referring Phys: [place]  1. Left ventricular ejection fraction, by estimation, is 30 to 35%. The left ventricle has moderately decreased function. The left ventricle demonstrates global hypokinesis. The left ventricular internal cavity size was mildly dilated. There is mild left ventricular hypertrophy. Left ventricular diastolic parameters are consistent with Grade II diastolic dysfunction (pseudonormalization). Elevated left ventricular end-diastolic pressure.  2. Right ventricular systolic function is normal. The right ventricular size is normal. There is moderately elevated pulmonary artery systolic pressure.  3. Left atrial size was moderately dilated.  4. The mitral valve is normal in structure. Trivial mitral valve regurgitation. No evidence of mitral stenosis.  5. Tricuspid valve regurgitation is moderate.  6. The aortic valve is tricuspid. Aortic valve regurgitation is not visualized. No aortic stenosis is present.  7. The inferior vena cava is normal in size with greater than 50% respiratory variability, suggesting right atrial pressure of 3 mmHg. FINDINGS  Left Ventricle: EF similar to TTE 01/22/19. Left ventricular ejection fraction, by estimation, is 30 to 35%. The left ventricle has moderately decreased function. The left ventricle demonstrates global hypokinesis. Definity contrast agent was given IV to  delineate the left ventricular endocardial borders. The left ventricular internal cavity size was mildly dilated. There is mild left ventricular hypertrophy. Left ventricular diastolic parameters are consistent with Grade II diastolic dysfunction (pseudonormalization). Elevated left  ventricular end-diastolic pressure. Right Ventricle: The right ventricular size is normal. No increase in right ventricular wall thickness. Right ventricular systolic function is normal. There is moderately elevated pulmonary artery systolic pressure. The tricuspid regurgitant velocity is 3.44 m/s, and with an assumed right atrial pressure of 8 mmHg, the estimated right ventricular systolic pressure is 45.8 mmHg. Left Atrium: Left atrial size was moderately dilated. Right Atrium: Right atrial size was normal in size. Pericardium: There is no evidence of pericardial effusion. Mitral Valve: The mitral valve is normal in structure. Trivial mitral valve regurgitation. No evidence of mitral valve stenosis. Tricuspid Valve: The tricuspid valve is normal in structure. Tricuspid valve regurgitation is moderate . No evidence of tricuspid stenosis. Aortic Valve: The aortic valve is tricuspid. Aortic valve regurgitation is not visualized. No aortic stenosis is present. Pulmonic Valve: The pulmonic valve was normal in structure. Pulmonic valve regurgitation is not visualized. No evidence of pulmonic stenosis. Aorta: The aortic root is normal in size and structure. Venous: The inferior vena cava is normal in size with greater than 50% respiratory variability, suggesting right atrial pressure of 3  mmHg. IAS/Shunts: No atrial level shunt detected by color flow Doppler.  LEFT VENTRICLE PLAX 2D LVIDd:         6.07 cm  Diastology LVIDs:         5.11 cm  LV e' medial:    5.22 cm/s LV PW:         1.22 cm  LV E/e' medial:  20.1 LV IVS:        1.25 cm  LV e' lateral:   4.13 cm/s LVOT diam:     2.40 cm  LV E/e' lateral: 25.4 LV SV:         65 LV SV Index:   30 LVOT Area:     4.52 cm  RIGHT VENTRICLE RV Basal diam:  3.35 cm RV S prime:     11.90 cm/s TAPSE (M-mode): 2.4 cm LEFT ATRIUM              Index       RIGHT ATRIUM           Index LA diam:        5.10 cm  2.36 cm/m  RA Area:     23.30 cm LA Vol (A2C):   105.0 ml 48.58 ml/m RA  Volume:   75.90 ml  35.12 ml/m LA Vol (A4C):   69.6 ml  32.20 ml/m LA Biplane Vol: 85.8 ml  39.70 ml/m  AORTIC VALVE LVOT Vmax:   75.10 cm/s LVOT Vmean:  50.000 cm/s LVOT VTI:    0.143 m  AORTA Ao Root diam: 2.60 cm MITRAL VALVE                TRICUSPID VALVE MV Area (PHT): 5.23 cm     TR Peak grad:   47.3 mmHg MV Decel Time: 145 msec     TR Vmax:        344.00 cm/s MV E velocity: 105.00 cm/s MV A velocity: 57.80 cm/s   SHUNTS MV E/A ratio:  1.82         Systemic VTI:  0.14 m                             Systemic Diam: 2.40 cm Jenkins Rouge MD Electronically signed by Jenkins Rouge MD Signature Date/Time: 02/02/2020/11:33:09 AM    Final      Assessment and Plan:   1. Acute on chronic systolic HF 94/8546 echo: LVEF 30-35%, grade II DDx, normal RV - reported dry weight 207 lbs,weights here appear inaccurate.  - I/Os incomplete, roughly 1.7 L of uop documented. He is on IV lasix 60mg  bid, uptrend in Cr. Baseline looks to be labile 2.0 to 2.8 - will need to accept higher Cr in order to diurese.  - medical therapy with coreg 6.25mg  bid, hydral 100mg  tid, imdur 120. No ACE/ARB/ARNI/aldactone given renal dysfunction.   - increase lasix to 80mg  IV bid today, follow renal function. I think at this time will be able to diurese with accepting a high Cr  2. CAD - no acute issues  3. CKD V - followed by nephrology as outpatient - follow renal function  For questions or updates, please contact [place] Please consult [email or web address] for contact info under    Signed, Carlyle Dolly, MD  02/04/2020 8:58 AM

## 2020-02-04 NOTE — Progress Notes (Signed)
Inpatient Diabetes Program Recommendations  AACE/ADA: New Consensus Statement on Inpatient Glycemic Control (2015)  Target Ranges:  Prepandial:   less than 140 mg/dL      Peak postprandial:   less than 180 mg/dL (1-2 hours)      Critically ill patients:  140 - 180 mg/dL   Lab Results  Component Value Date   GLUCAP 95 02/04/2020   HGBA1C 8.1 (H) 02/01/2020    Review of Glycemic Control Results for BEECHER, Brad Mendoza (MRN [id number]) as of 02/04/2020 11:34  Ref. Range 02/03/2020 08:38 02/03/2020 09:24 02/03/2020 11:44 02/03/2020 16:38 02/03/2020 21:00 02/04/2020 07:48 02/04/2020 08:22  Glucose-Capillary Latest Ref Range: 70 - 99 mg/dL 33 (LL) 146 (H) 177 (H) 231 (H) 192 (H) 64 (L) 95   Diabetes history: DM 2 Outpatient Diabetes medications:  Toujeo 50 units q AM and 40 units q PM Semaglutide 1 mg weekly Current orders for Inpatient glycemic control:  Lantus 15 units bid Novolog moderate tid with meals and HS  Inpatient Diabetes Program Recommendations:    Blood sugar low this morning after only receiving one dose of Lantus on 12/14.  May consider reducing Lantus to 15 units once a day (has already received dose this morning).   Thanks,  Adah Perl, RN, BC-ADM Inpatient Diabetes Coordinator Pager [phone] (8a-5p)

## 2020-02-05 DIAGNOSIS — N184 Chronic kidney disease, stage 4 (severe): Secondary | ICD-10-CM

## 2020-02-05 DIAGNOSIS — I5023 Acute on chronic systolic (congestive) heart failure: Secondary | ICD-10-CM

## 2020-02-05 DIAGNOSIS — I10 Essential (primary) hypertension: Secondary | ICD-10-CM

## 2020-02-05 DIAGNOSIS — I25119 Atherosclerotic heart disease of native coronary artery with unspecified angina pectoris: Secondary | ICD-10-CM

## 2020-02-05 LAB — BASIC METABOLIC PANEL
Anion gap: 8 (ref 5–15)
BUN: 59 mg/dL — ABNORMAL HIGH (ref 8–23)
CO2: 23 mmol/L (ref 22–32)
Calcium: 8 mg/dL — ABNORMAL LOW (ref 8.9–10.3)
Chloride: 107 mmol/L (ref 98–111)
Creatinine, Ser: 2.76 mg/dL — ABNORMAL HIGH (ref 0.61–1.24)
GFR, Estimated: 25 mL/min — ABNORMAL LOW (ref >60)
Glucose, Bld: 52 mg/dL — ABNORMAL LOW (ref 70–99)
Potassium: 4.2 mmol/L (ref 3.5–5.1)
Sodium: 138 mmol/L (ref 135–145)

## 2020-02-05 LAB — GLUCOSE, CAPILLARY
Glucose-Capillary: 43 mg/dL — CL (ref 70–99)
Glucose-Capillary: 98 mg/dL (ref 70–99)
Glucose-Capillary: 131 mg/dL — ABNORMAL HIGH (ref 70–99)
Glucose-Capillary: 176 mg/dL — ABNORMAL HIGH (ref 70–99)
Glucose-Capillary: 177 mg/dL — ABNORMAL HIGH (ref 70–99)

## 2020-02-05 MED ORDER — INSULIN ASPART 100 UNIT/ML ~~LOC~~ SOLN: 0.0000 [IU] | Freq: Every day | SUBCUTANEOUS | Status: DC

## 2020-02-05 MED ORDER — INSULIN GLARGINE 100 UNIT/ML ~~LOC~~ SOLN
10.0000 [IU] | Freq: Every day | SUBCUTANEOUS | Status: DC
  Administered 2020-02-05 – 2020-02-06 (×2): 10 [IU] via SUBCUTANEOUS
  Filled 2020-02-05 (×5): qty 0.1

## 2020-02-05 MED ORDER — METOLAZONE 5 MG PO TABS
2.5000 mg | ORAL_TABLET | Freq: Once | ORAL | Status: AC
  Administered 2020-02-05: 10:00:00 2.5 mg via ORAL
  Filled 2020-02-05: qty 1

## 2020-02-05 MED ORDER — OMEGA-3-ACID ETHYL ESTERS 1 G PO CAPS
1.0000 g | ORAL_CAPSULE | Freq: Two times a day (BID) | ORAL | Status: DC
  Administered 2020-02-05 – 2020-02-07 (×4): 1 g via ORAL
  Filled 2020-02-05 (×4): qty 1

## 2020-02-05 MED ORDER — INSULIN ASPART 100 UNIT/ML ~~LOC~~ SOLN
0.0000 [IU] | Freq: Three times a day (TID) | SUBCUTANEOUS | Status: DC
  Administered 2020-02-05: 12:00:00 1 [IU] via SUBCUTANEOUS
  Administered 2020-02-05: 18:00:00 2 [IU] via SUBCUTANEOUS
  Administered 2020-02-06: 18:00:00 3 [IU] via SUBCUTANEOUS
  Administered 2020-02-07: 14:00:00 1 [IU] via SUBCUTANEOUS

## 2020-02-05 NOTE — Progress Notes (Signed)
PROGRESS NOTE  Brad Mendoza STM:[id number] DOB: [birth year] DOA: 02/01/2020 PCP: [name], MD  Brief History:  64 year old male with a history of cardiomyopathy, ejection fraction of 30 to 35%, coronary artery diseasestatus post RCA stent, chronic kidney disease stage IV, AICD presents to the hospital with volume overload.  He was noted to have significant weight gain and worsening lower extremity edema. .patient been followed closely recently by cardiology and nephrology Dr. [name] due to retention, and weight gain, where his torsemide has been gradually uptitrated over last 2 months from 40 mg once daily to 100 mg once daily by Dr. [name], as well fluid gain from 207 to 232 lbs by Dr. [name] record, patient presents to ED secondary to worsening shortness of breath, orthopnea and exertional dyspnea, worsening lower extremity over the last few weeks,   He was admitted for IV diuresis.   Assessment/Plan: Acute on chronic combined CHF -02/02/20 Echo--EF 30 to 35%, global HK, G2DD, mod TR -Patient reports compliance with medication, though he may have had some dietary indiscretions -Continue intravenous Lasix 80 IV bid -Patient had approximately 1675cc of urine output yesterday -Dry weight is approximately 207 pounds.  Weight on admission 221 pounds -Further daily weights have not been documented, we will ask staff to check daily weights -Continue on Coreg, hydralazine, Imdur, not on Entresto due to renal insufficiency -Cardiology following, appreciate input  coronary artery disease -Status post DES to proximal RCA in 02/2018, on Xarelto 2.5 mg twice daily, aspirin, Coreg, Ranexa, Imdur as part of the [facility] protocol -no chest pain presently  Hyperlipidemia.   -Continue statin  Chronic kidney disease stage IV -Baseline creatinine approximately 2.4-2.6 -Creatinine trending up with diuresis -Continue to monitor in setting of diuresis -We will likely need to  tolerate a higher creatinine to achieve diuresis  Diabetes, type II, uncontrolled with hyperglycemia -12/11 A1C--8.1 -no having more hypoglycemia with increasing creatinine -decrease  -Currently on Lantus and sliding scale insulin -decrease lantus to 10 units daily -Blood sugars currently stable.  Continue to monitor  Essential Hypertension -continuee hydralazine, imdur, coreg      Status is: Inpatient  Remains inpatient appropriate because:IV treatments appropriate due to intensity of illness or inability to take PO   Dispo: The patient is from: Home              Anticipated d/c is to: Home              Anticipated d/c date is: 2 days              Patient currently is not medically stable to d/c.        Family Communication:  no Family at bedside  Consultants:  cardiology  Code Status:  FULL  DVT Prophylaxis:  Xarelto   Procedures: As Listed in Progress Note Above  Antibiotics: None       Subjective: Patient denies fevers, chills, headache, chest pain, dyspnea, nausea, vomiting, diarrhea, abdominal pain, dysuria, hematuria, hematochezia, and melena.   Objective: Vitals:   02/04/20 2021 02/05/20 0349 02/05/20 0439 02/05/20 1400  BP: (!) 154/94 (!) 141/84  116/65  Pulse: 79 78  75  Resp: 19 18  18   Temp: 98.8 F (37.1 C) 98.2 F (36.8 C)  98.2 F (36.8 C)  TempSrc:    Oral  SpO2: 98% 97%  97%  Weight:   98.6 kg   Height:        Intake/Output Summary (  Last 24 hours) at 02/05/2020 1749 Last data filed at 02/05/2020 1500 Gross per 24 hour  Intake 600 ml  Output 2350 ml  Net -1750 ml   Weight change:  Exam:   General:  Pt is alert, follows commands appropriately, not in acute distress  HEENT: No icterus, No thrush, No neck mass, [place]/AT  Cardiovascular: RRR, S1/S2, no rubs, no gallops  Respiratory: bibasilar crackles. No wheeze  Abdomen: Soft/+BS, non tender, non distended, no guarding  Extremities: No edema, No lymphangitis, No  petechiae, No rashes, no synovitis   Data Reviewed: I have personally reviewed following labs and imaging studies Basic Metabolic Panel: Recent Labs  Lab 02/01/20 1848 02/02/20 0714 02/03/20 0610 02/04/20 0547 02/05/20 0550  NA 139 137 137 138 138  K 4.4 4.0 4.0 3.9 4.2  CL 109 109 107 107 107  CO2 23 21* 22 24 23   GLUCOSE 199* 100* 39* 76 52*  BUN 58* 54* 58* 61* 59*  CREATININE 2.61* 2.50* 2.81* 2.85* 2.76*  CALCIUM 8.4* 8.1* 8.1* 7.9* 8.0*   Liver Function Tests: Recent Labs  Lab 02/01/20 1848  AST 30  ALT 31  ALKPHOS 105  BILITOT 0.4  PROT 5.6*  ALBUMIN 2.5*   No results for input(s): LIPASE, AMYLASE in the last 168 hours. No results for input(s): AMMONIA in the last 168 hours. Coagulation Profile: No results for input(s): INR, PROTIME in the last 168 hours. CBC: Recent Labs  Lab 02/01/20 1848  WBC 6.6  NEUTROABS 4.5  HGB 10.4*  HCT 33.8*  MCV 83.9  PLT 284   Cardiac Enzymes: No results for input(s): CKTOTAL, CKMB, CKMBINDEX, TROPONINI in the last 168 hours. BNP: Invalid input(s): POCBNP CBG: Recent Labs  Lab 02/04/20 2022 02/05/20 0830 02/05/20 0919 02/05/20 1147 02/05/20 1634  GLUCAP 148* 43* 98 131* 176*   HbA1C: No results for input(s): HGBA1C in the last 72 hours. Urine analysis:    Component Value Date/Time   COLORURINE STRAW (A) 03/28/2019 1905   APPEARANCEUR CLEAR 03/28/2019 1905   APPEARANCEUR Clear 02/05/2019 1357   LABSPEC 1.020 03/28/2019 1905   PHURINE 5.0 03/28/2019 1905   GLUCOSEU >=500 (A) 03/28/2019 1905   HGBUR SMALL (A) 03/28/2019 1905   BILIRUBINUR NEGATIVE 03/28/2019 1905   BILIRUBINUR Negative 02/05/2019 1357   [place] 03/28/2019 1905   PROTEINUR 100 (A) 03/28/2019 1905   UROBILINOGEN negative 11/07/2014 1154   UROBILINOGEN 0.2 05/07/2013 0851   NITRITE NEGATIVE 03/28/2019 1905   LEUKOCYTESUR NEGATIVE 03/28/2019 1905   Sepsis Labs: @LABRCNTIP (procalcitonin:4,lacticidven:4) ) Recent Results (from  the past 240 hour(s))  Resp Panel by RT-PCR (Flu A&B, Covid) Nasopharyngeal Swab     Status: None   Collection Time: 02/01/20  7:44 PM   Specimen: Nasopharyngeal Swab; Nasopharyngeal(NP) swabs in vial transport medium  Result Value Ref Range Status   SARS Coronavirus 2 by RT PCR NEGATIVE NEGATIVE Final    Comment: (NOTE) SARS-CoV-2 target nucleic acids are NOT DETECTED.  The SARS-CoV-2 RNA is generally detectable in upper respiratory specimens during the acute phase of infection. The lowest concentration of SARS-CoV-2 viral copies this assay can detect is 138 copies/mL. A negative result does not preclude SARS-Cov-2 infection and should not be used as the sole basis for treatment or other patient management decisions. A negative result may occur with  improper specimen collection/handling, submission of specimen other than nasopharyngeal swab, presence of viral mutation(s) within the areas targeted by this assay, and inadequate number of viral copies(<138 copies/mL). A negative result must  be combined with clinical observations, patient history, and epidemiological information. The expected result is Negative.  Fact Sheet for Patients:  EntrepreneurPulse.com.au  Fact Sheet for Healthcare Providers:  IncredibleEmployment.be  This test is no t yet approved or cleared by the [place] FDA and  has been authorized for detection and/or diagnosis of SARS-CoV-2 by FDA under an Emergency Use Authorization (EUA). This EUA will remain  in effect (meaning this test can be used) for the duration of the COVID-19 declaration under Section 564(b)(1) of the Act, 21 U.S.C.section 360bbb-3(b)(1), unless the authorization is terminated  or revoked sooner.       Influenza A by PCR NEGATIVE NEGATIVE Final   Influenza B by PCR NEGATIVE NEGATIVE Final    Comment: (NOTE) The Xpert Xpress SARS-CoV-2/FLU/RSV plus assay is intended as an aid in the diagnosis of  influenza from Nasopharyngeal swab specimens and should not be used as a sole basis for treatment. Nasal washings and aspirates are unacceptable for Xpert Xpress SARS-CoV-2/FLU/RSV testing.  Fact Sheet for Patients: EntrepreneurPulse.com.au  Fact Sheet for Healthcare Providers: IncredibleEmployment.be  This test is not yet approved or cleared by the [place] FDA and has been authorized for detection and/or diagnosis of SARS-CoV-2 by FDA under an Emergency Use Authorization (EUA). This EUA will remain in effect (meaning this test can be used) for the duration of the COVID-19 declaration under Section 564(b)(1) of the Act, 21 U.S.C. section 360bbb-3(b)(1), unless the authorization is terminated or revoked.  Performed at [facility], [address], [place], [place] [address]      Scheduled Meds: . aspirin EC  81 mg Oral Daily  . carvedilol  6.25 mg Oral BID WC  . furosemide  80 mg Intravenous BID  . hydrALAZINE  100 mg Oral Q8H  . insulin aspart  0-5 Units Subcutaneous QHS  . insulin aspart  0-9 Units Subcutaneous TID WC  . insulin glargine  10 Units Subcutaneous QHS  . isosorbide mononitrate  120 mg Oral Daily  . omega-3 acid ethyl esters  1 g Oral BID  . pantoprazole  40 mg Oral Daily  . ranolazine  500 mg Oral BID  . rivaroxaban  2.5 mg Oral BID  . rosuvastatin  10 mg Oral Daily  . sodium chloride flush  3 mL Intravenous Q12H   Continuous Infusions: . sodium chloride      Procedures/Studies: DG Chest Port 1 View  Result Date: 02/01/2020 CLINICAL DATA:  Shortness of breath, bilateral lower leg swelling. Coronary artery disease. EXAM: PORTABLE CHEST 1 VIEW COMPARISON:  Chest x-ray 12/09/2019 FINDINGS: Redemonstration of a left chest wall ICD implant in stable position. The heart size and mediastinal contours are unchanged. Persistent interstitial coarsening. Slightly decreased inspiratory effort compared to prior. Suggestion of  vague patchy airspace opacities within bilateral lower lung zones. Mild pulmonary edema. No pleural effusion. No pneumothorax. No acute osseous abnormality. IMPRESSION: 1. Vague patchy airspace opacities within bilateral lower lung zones could represent atelectasis versus infection/inflammation. 2. Mild pulmonary edema. Electronically Signed   By: Iven Finn M.D.   On: 02/01/2020 20:14   ECHOCARDIOGRAM COMPLETE  Result Date: 02/02/2020    ECHOCARDIOGRAM REPORT   Patient Name:   Brad Mendoza Date of Exam: 02/02/2020 Medical Rec #:  [id number]       Height:       69.0 in Accession #:    [id number]      Weight:       222.4 lb Date of Birth:  [birth year]  BSA:          2.161 m Patient Age:    75 years        BP:           148/93 mmHg Patient Gender: M               HR:           82 bpm. Exam Location:  [facility] Procedure: 2D Echo, Cardiac Doppler, Color Doppler and Intracardiac            Opacification Agent Indications:    Dyspnea  History:        Patient has prior history of Echocardiogram examinations. CHF                 and Cardiomyopathy, CAD, Defibrillator; Risk                 Factors:Hypertension, Diabetes and Dyslipidemia. Polysubstance                 abuse.  Sonographer:    Dustin Flock RDCS Referring Phys: [place]  1. Left ventricular ejection fraction, by estimation, is 30 to 35%. The left ventricle has moderately decreased function. The left ventricle demonstrates global hypokinesis. The left ventricular internal cavity size was mildly dilated. There is mild left ventricular hypertrophy. Left ventricular diastolic parameters are consistent with Grade II diastolic dysfunction (pseudonormalization). Elevated left ventricular end-diastolic pressure.  2. Right ventricular systolic function is normal. The right ventricular size is normal. There is moderately elevated pulmonary artery systolic pressure.  3. Left atrial size was moderately dilated.  4. The  mitral valve is normal in structure. Trivial mitral valve regurgitation. No evidence of mitral stenosis.  5. Tricuspid valve regurgitation is moderate.  6. The aortic valve is tricuspid. Aortic valve regurgitation is not visualized. No aortic stenosis is present.  7. The inferior vena cava is normal in size with greater than 50% respiratory variability, suggesting right atrial pressure of 3 mmHg. FINDINGS  Left Ventricle: EF similar to TTE 01/22/19. Left ventricular ejection fraction, by estimation, is 30 to 35%. The left ventricle has moderately decreased function. The left ventricle demonstrates global hypokinesis. Definity contrast agent was given IV to  delineate the left ventricular endocardial borders. The left ventricular internal cavity size was mildly dilated. There is mild left ventricular hypertrophy. Left ventricular diastolic parameters are consistent with Grade II diastolic dysfunction (pseudonormalization). Elevated left ventricular end-diastolic pressure. Right Ventricle: The right ventricular size is normal. No increase in right ventricular wall thickness. Right ventricular systolic function is normal. There is moderately elevated pulmonary artery systolic pressure. The tricuspid regurgitant velocity is 3.44 m/s, and with an assumed right atrial pressure of 8 mmHg, the estimated right ventricular systolic pressure is 29.5 mmHg. Left Atrium: Left atrial size was moderately dilated. Right Atrium: Right atrial size was normal in size. Pericardium: There is no evidence of pericardial effusion. Mitral Valve: The mitral valve is normal in structure. Trivial mitral valve regurgitation. No evidence of mitral valve stenosis. Tricuspid Valve: The tricuspid valve is normal in structure. Tricuspid valve regurgitation is moderate . No evidence of tricuspid stenosis. Aortic Valve: The aortic valve is tricuspid. Aortic valve regurgitation is not visualized. No aortic stenosis is present. Pulmonic Valve: The  pulmonic valve was normal in structure. Pulmonic valve regurgitation is not visualized. No evidence of pulmonic stenosis. Aorta: The aortic root is normal in size and structure. Venous: The inferior vena cava is normal in  size with greater than 50% respiratory variability, suggesting right atrial pressure of 3 mmHg. IAS/Shunts: No atrial level shunt detected by color flow Doppler.  LEFT VENTRICLE PLAX 2D LVIDd:         6.07 cm  Diastology LVIDs:         5.11 cm  LV e' medial:    5.22 cm/s LV PW:         1.22 cm  LV E/e' medial:  20.1 LV IVS:        1.25 cm  LV e' lateral:   4.13 cm/s LVOT diam:     2.40 cm  LV E/e' lateral: 25.4 LV SV:         65 LV SV Index:   30 LVOT Area:     4.52 cm  RIGHT VENTRICLE RV Basal diam:  3.35 cm RV S prime:     11.90 cm/s TAPSE (M-mode): 2.4 cm LEFT ATRIUM              Index       RIGHT ATRIUM           Index LA diam:        5.10 cm  2.36 cm/m  RA Area:     23.30 cm LA Vol (A2C):   105.0 ml 48.58 ml/m RA Volume:   75.90 ml  35.12 ml/m LA Vol (A4C):   69.6 ml  32.20 ml/m LA Biplane Vol: 85.8 ml  39.70 ml/m  AORTIC VALVE LVOT Vmax:   75.10 cm/s LVOT Vmean:  50.000 cm/s LVOT VTI:    0.143 m  AORTA Ao Root diam: 2.60 cm MITRAL VALVE                TRICUSPID VALVE MV Area (PHT): 5.23 cm     TR Peak grad:   47.3 mmHg MV Decel Time: 145 msec     TR Vmax:        344.00 cm/s MV E velocity: 105.00 cm/s MV A velocity: 57.80 cm/s   SHUNTS MV E/A ratio:  1.82         Systemic VTI:  0.14 m                             Systemic Diam: 2.40 cm Jenkins Rouge MD Electronically signed by Jenkins Rouge MD Signature Date/Time: 02/02/2020/11:33:09 AM    Final    CUP PACEART REMOTE DEVICE CHECK  Result Date: 01/19/2020 Scheduled remote reviewed. Normal device function.  Next remote 91 days.Sensing Configuration: Primary Gain Setting: 1X Post Shock Pacing: ON   Orson Eva, DO  [facility]  If 7PM-7AM, please contact night-coverage [email or web address] Password [email or web address] 02/05/2020, 5:49 PM    LOS: 4 days

## 2020-02-05 NOTE — Progress Notes (Signed)
Progress Note  Patient Name: Brad Mendoza Date of Encounter: 02/05/2020  [facility] Cardiologist: Rozann Lesches, MD   Subjective   SOB improving.   Inpatient Medications    Scheduled Meds: . aspirin EC  81 mg Oral Daily  . carvedilol  6.25 mg Oral BID WC  . furosemide  80 mg Intravenous BID  . hydrALAZINE  100 mg Oral Q8H  . icosapent Ethyl  2 g Oral BID  . insulin aspart  0-15 Units Subcutaneous TID WC  . insulin aspart  0-5 Units Subcutaneous QHS  . insulin glargine  15 Units Subcutaneous BID AC  . isosorbide mononitrate  120 mg Oral Daily  . pantoprazole  40 mg Oral Daily  . ranolazine  500 mg Oral BID  . rivaroxaban  2.5 mg Oral BID  . rosuvastatin  10 mg Oral Daily  . sodium chloride flush  3 mL Intravenous Q12H   Continuous Infusions: . sodium chloride     PRN Meds: sodium chloride, acetaminophen, nitroGLYCERIN, ondansetron (ZOFRAN) IV, sodium chloride flush   Vital Signs    Vitals:   02/04/20 1353 02/04/20 2021 02/05/20 0349 02/05/20 0439  BP: (!) 169/86 (!) 154/94 (!) 141/84   Pulse: 80 79 78   Resp: 20 19 18    Temp: 98 F (36.7 C) 98.8 F (37.1 C) 98.2 F (36.8 C)   TempSrc: Oral     SpO2: 99% 98% 97%   Weight:    98.6 kg  Height:        Intake/Output Summary (Last 24 hours) at 02/05/2020 0849 Last data filed at 02/05/2020 0300 Gross per 24 hour  Intake 240 ml  Output 600 ml  Net -360 ml   Last 3 Weights 02/05/2020 02/01/2020 02/01/2020  Weight (lbs) 217 lb 4.8 oz 222 lb 6.4 oz 232 lb  Weight (kg) 98.567 kg 100.88 kg 105.235 kg      Telemetry    SR - Personally Reviewed  ECG    n/a - Personally Reviewed  Physical Exam   GEN: No acute distress.   Neck: No JVD Cardiac: RRR, no murmurs, rubs, or gallops.  Respiratory: Clear to auscultation bilaterally. GI: Soft, nontender, non-distended  MS: 1+ bilateral LE edema; No deformity. Neuro:  Nonfocal  Psych: Normal affect   Labs    High Sensitivity Troponin:   Recent  Labs  Lab 02/01/20 1848  TROPONINIHS 41*      Chemistry Recent Labs  Lab 02/01/20 1848 02/02/20 0714 02/03/20 0610 02/04/20 0547 02/05/20 0550  NA 139   < > 137 138 138  K 4.4   < > 4.0 3.9 4.2  CL 109   < > 107 107 107  CO2 23   < > 22 24 23   GLUCOSE 199*   < > 39* 76 52*  BUN 58*   < > 58* 61* 59*  CREATININE 2.61*   < > 2.81* 2.85* 2.76*  CALCIUM 8.4*   < > 8.1* 7.9* 8.0*  PROT 5.6*  --   --   --   --   ALBUMIN 2.5*  --   --   --   --   AST 30  --   --   --   --   ALT 31  --   --   --   --   ALKPHOS 105  --   --   --   --   BILITOT 0.4  --   --   --   --  GFRNONAA 27*   < > 24* 24* 25*  ANIONGAP 7   < > 8 7 8    < > = values in this interval not displayed.     Hematology Recent Labs  Lab 02/01/20 1848  WBC 6.6  RBC 4.03*  HGB 10.4*  HCT 33.8*  MCV 83.9  MCH 25.8*  MCHC 30.8  RDW 16.3*  PLT 284    BNP Recent Labs  Lab 02/01/20 1848  BNP 472.0*     DDimer No results for input(s): DDIMER in the last 168 hours.   Radiology    No results found.  Cardiac Studies     Patient Profile     Brad Mendoza is a 64 y.o. male with a hx of CAD and chronic sysotlic HFwho is being seen today for the evaluation of SOB at the request of Dr Roderic Palau.  Assessment & Plan    1. Acute on chronic systolic HF 61/2244 echo: LVEF 30-35%, grade II DDx, normal RV - exacerbation appears due to diuretic noncompliance - reported dry weight 207 lbs,weights here appear inaccurate this admit. Reportedly 215 lbs today.   - I/Os incomplete. He is on IV lasix 80mg  bid, mild variations in Cr without a clear trend.   Baseline looks to be labile 2.0 to 2.8. Will need to accept higher Cr in order to diurese.  - medical therapy with coreg 6.25mg  bid, hydral 100mg  tid, imdur 120. No ACE/ARB/ARNI/aldactone given renal dysfunction.   - continue IV lasix, dose oral metolazone 2.5mg  x 1 today.    2. CAD - no acute issues  3. CKD V - followed by nephrology as outpatient -  follow renal function  For questions or updates, please contact [place] Please consult [email or web address] for contact info under        Signed, Carlyle Dolly, MD  02/05/2020, 8:49 AM

## 2020-02-06 LAB — BASIC METABOLIC PANEL
Anion gap: 6 (ref 5–15)
BUN: 64 mg/dL — ABNORMAL HIGH (ref 8–23)
CO2: 25 mmol/L (ref 22–32)
Calcium: 7.9 mg/dL — ABNORMAL LOW (ref 8.9–10.3)
Chloride: 104 mmol/L (ref 98–111)
Creatinine, Ser: 3.35 mg/dL — ABNORMAL HIGH (ref 0.61–1.24)
GFR, Estimated: 20 mL/min — ABNORMAL LOW (ref >60)
Glucose, Bld: 107 mg/dL — ABNORMAL HIGH (ref 70–99)
Potassium: 4.4 mmol/L (ref 3.5–5.1)
Sodium: 135 mmol/L (ref 135–145)

## 2020-02-06 LAB — GLUCOSE, CAPILLARY
Glucose-Capillary: 131 mg/dL — ABNORMAL HIGH (ref 70–99)
Glucose-Capillary: 71 mg/dL (ref 70–99)
Glucose-Capillary: 87 mg/dL (ref 70–99)
Glucose-Capillary: 208 mg/dL — ABNORMAL HIGH (ref 70–99)
Glucose-Capillary: 178 mg/dL — ABNORMAL HIGH (ref 70–99)

## 2020-02-06 LAB — BRAIN NATRIURETIC PEPTIDE: B Natriuretic Peptide: 194 pg/mL — ABNORMAL HIGH (ref 0.0–100.0)

## 2020-02-06 MED ORDER — LIVING BETTER WITH HEART FAILURE BOOK
Freq: Once | Status: AC
  Administered 2020-02-06: 10:00:00 1

## 2020-02-06 MED ORDER — POLYETHYLENE GLYCOL 3350 17 G PO PACK
17.0000 g | PACK | Freq: Once | ORAL | Status: AC
  Administered 2020-02-06: 22:00:00 17 g via ORAL
  Filled 2020-02-06: qty 1

## 2020-02-06 NOTE — Progress Notes (Signed)
Patient given a bath. Flexiseal removed. Mepilex on sacrum changed. Stage 2 on sacrum cleaned.

## 2020-02-06 NOTE — Progress Notes (Signed)
PROGRESS NOTE  Brad Mendoza PZW:[id number] DOB: 1955/09/09 DOA: 02/01/2020 PCP: [name], MD  Brief History:  64 year old male with a history of cardiomyopathy, ejection fraction of 30 to 35%, coronary artery diseasestatus post RCA stent, chronic kidney disease stage IV, AICD presents to the hospital with volume overload. He was noted to have significant weight gain and worsening lower extremity edema. .patient been followed closely recently by cardiology and nephrology Dr. [name] due to retention, and weight gain, where his torsemide has been gradually uptitrated over last 2 months from 40 mg once daily to 100 mg once daily by Dr. [name], as well fluid gain from 207to232 lbs by Dr. [name] record, patient presents to ED secondary to worsening shortness of breath, orthopnea and exertional dyspnea, worsening lower extremity over the last few weeks,  He was admitted for IV diuresis.   Assessment/Plan: Acute on chronic combined CHF -02/02/20 Echo--EF 30 to 35%, global HK, G2DD, mod TR -Patient reports compliance with medication, though he may have had some dietary indiscretions -Continue intravenous Lasix 80 IV bid>>hold on 12/16 -Neg 4.7 L for the admission -Dry weight is approximately 207 pounds. Weight on admission 221 pounds -Further daily weights have not been documented, we will ask staff to check daily weights--NEG 5 lbs -Continue on Coreg, hydralazine, Imdur, not on Entresto due to renal insufficiency -Cardiology following, appreciate input  coronary artery disease -Status post DES to proximal RCA in 02/2018, on Xarelto 2.5 mg twice daily, aspirin, Coreg, Ranexa, Imdur as part of the Compass protocol -no chest pain presently  Hyperlipidemia.  -Continue statin  Chronic kidney disease stage IV -Baseline creatinine approximately 2.4-2.6 -Creatinine trending up with diuresis-->hold lasix IV 12/16 -Continue to monitor in setting of diuresis -We will  likely need to tolerate a higher creatinine to achieve diuresis  Diabetes, type II, uncontrolled with hyperglycemia -12/11 A1C--8.1 -no having more hypoglycemia with increasing creatinine -decrease  -Currently on Lantus and sliding scale insulin -decrease lantus to 10 units daily -Blood sugars currently stable. Continue to monitor  Essential Hypertension -continuee hydralazine, imdur, coreg      Status is: Inpatient  Remains inpatient appropriate because:IV treatments appropriate due to intensity of illness or inability to take PO   Dispo: The patient is from: Home  Anticipated d/c is to: Home  Anticipated d/c date is: 1 days  Patient currently is not medically stable to d/c.        Family Communication:  no Family at bedside  Consultants:  cardiology  Code Status:  FULL  DVT Prophylaxis:  Xarelto   Procedures: As Listed in Progress Note Above  Antibiotics: None        Subjective: Patient denies fevers, chills, headache, chest pain, dyspnea, nausea, vomiting, diarrhea, abdominal pain, dysuria, hematuria, hematochezia, and melena.   Objective: Vitals:   02/05/20 2043 02/06/20 0448 02/06/20 0500 02/06/20 1416  BP: (!) 152/91 108/81  117/70  Pulse: 79 81  71  Resp: 18 18  18   Temp: 98.7 F (37.1 C) 98.6 F (37 C)  98.5 F (36.9 C)  TempSrc:  Oral  Oral  SpO2: 98% 94%  100%  Weight:   98.7 kg   Height:        Intake/Output Summary (Last 24 hours) at 02/06/2020 1758 Last data filed at 02/06/2020 1534 Gross per 24 hour  Intake --  Output 1500 ml  Net -1500 ml   Weight change: 0.133 kg Exam:   General:  Pt is alert, follows commands appropriately, not in acute distress  HEENT: No icterus, No thrush, No neck mass, [place]/AT  Cardiovascular: RRR, S1/S2, no rubs, no gallops  Respiratory: CTA bilaterally, no wheezing, no crackles, no rhonchi  Abdomen: Soft/+BS, non tender, non  distended, no guarding  Extremities: 1+LE edema, No lymphangitis, No petechiae, No rashes, no synovitis   Data Reviewed: I have personally reviewed following labs and imaging studies Basic Metabolic Panel: Recent Labs  Lab 02/02/20 0714 02/03/20 0610 02/04/20 0547 02/05/20 0550 02/06/20 0631  NA 137 137 138 138 135  K 4.0 4.0 3.9 4.2 4.4  CL 109 107 107 107 104  CO2 21* 22 24 23 25   GLUCOSE 100* 39* 76 52* 107*  BUN 54* 58* 61* 59* 64*  CREATININE 2.50* 2.81* 2.85* 2.76* 3.35*  CALCIUM 8.1* 8.1* 7.9* 8.0* 7.9*   Liver Function Tests: Recent Labs  Lab 02/01/20 1848  AST 30  ALT 31  ALKPHOS 105  BILITOT 0.4  PROT 5.6*  ALBUMIN 2.5*   No results for input(s): LIPASE, AMYLASE in the last 168 hours. No results for input(s): AMMONIA in the last 168 hours. Coagulation Profile: No results for input(s): INR, PROTIME in the last 168 hours. CBC: Recent Labs  Lab 02/01/20 1848  WBC 6.6  NEUTROABS 4.5  HGB 10.4*  HCT 33.8*  MCV 83.9  PLT 284   Cardiac Enzymes: No results for input(s): CKTOTAL, CKMB, CKMBINDEX, TROPONINI in the last 168 hours. BNP: Invalid input(s): POCBNP CBG: Recent Labs  Lab 02/05/20 2134 02/06/20 0452 02/06/20 0831 02/06/20 1134 02/06/20 1556  GLUCAP 177* 131* 71 87 208*   HbA1C: No results for input(s): HGBA1C in the last 72 hours. Urine analysis:    Component Value Date/Time   COLORURINE STRAW (A) 03/28/2019 1905   APPEARANCEUR CLEAR 03/28/2019 1905   APPEARANCEUR Clear 02/05/2019 1357   LABSPEC 1.020 03/28/2019 1905   PHURINE 5.0 03/28/2019 1905   GLUCOSEU >=500 (A) 03/28/2019 1905   HGBUR SMALL (A) 03/28/2019 1905   BILIRUBINUR NEGATIVE 03/28/2019 1905   BILIRUBINUR Negative 02/05/2019 1357   [place] 03/28/2019 1905   PROTEINUR 100 (A) 03/28/2019 1905   UROBILINOGEN negative 11/07/2014 1154   UROBILINOGEN 0.2 05/07/2013 0851   NITRITE NEGATIVE 03/28/2019 1905   LEUKOCYTESUR NEGATIVE 03/28/2019 1905   Sepsis  Labs: @LABRCNTIP (procalcitonin:4,lacticidven:4) ) Recent Results (from the past 240 hour(s))  Resp Panel by RT-PCR (Flu A&B, Covid) Nasopharyngeal Swab     Status: None   Collection Time: 02/01/20  7:44 PM   Specimen: Nasopharyngeal Swab; Nasopharyngeal(NP) swabs in vial transport medium  Result Value Ref Range Status   SARS Coronavirus 2 by RT PCR NEGATIVE NEGATIVE Final    Comment: (NOTE) SARS-CoV-2 target nucleic acids are NOT DETECTED.  The SARS-CoV-2 RNA is generally detectable in upper respiratory specimens during the acute phase of infection. The lowest concentration of SARS-CoV-2 viral copies this assay can detect is 138 copies/mL. A negative result does not preclude SARS-Cov-2 infection and should not be used as the sole basis for treatment or other patient management decisions. A negative result may occur with  improper specimen collection/handling, submission of specimen other than nasopharyngeal swab, presence of viral mutation(s) within the areas targeted by this assay, and inadequate number of viral copies(<138 copies/mL). A negative result must be combined with clinical observations, patient history, and epidemiological information. The expected result is Negative.  Fact Sheet for Patients:  EntrepreneurPulse.com.au  Fact Sheet for Healthcare Providers:  IncredibleEmployment.be  This test is  no t yet approved or cleared by the [place] and  has been authorized for detection and/or diagnosis of SARS-CoV-2 by FDA under an Emergency Use Authorization (EUA). This EUA will remain  in effect (meaning this test can be used) for the duration of the COVID-19 declaration under Section 564(b)(1) of the Act, 21 U.S.C.section 360bbb-3(b)(1), unless the authorization is terminated  or revoked sooner.       Influenza A by PCR NEGATIVE NEGATIVE Final   Influenza B by PCR NEGATIVE NEGATIVE Final    Comment: (NOTE) The Xpert Xpress  SARS-CoV-2/FLU/RSV plus assay is intended as an aid in the diagnosis of influenza from Nasopharyngeal swab specimens and should not be used as a sole basis for treatment. Nasal washings and aspirates are unacceptable for Xpert Xpress SARS-CoV-2/FLU/RSV testing.  Fact Sheet for Patients: EntrepreneurPulse.com.au  Fact Sheet for Healthcare Providers: IncredibleEmployment.be  This test is not yet approved or cleared by the [place] FDA and has been authorized for detection and/or diagnosis of SARS-CoV-2 by FDA under an Emergency Use Authorization (EUA). This EUA will remain in effect (meaning this test can be used) for the duration of the COVID-19 declaration under Section 564(b)(1) of the Act, 21 U.S.C. section 360bbb-3(b)(1), unless the authorization is terminated or revoked.  Performed at [facility], [address] [place], [place] [address]      Scheduled Meds:  aspirin EC  81 mg Oral Daily   carvedilol  6.25 mg Oral BID WC   hydrALAZINE  100 mg Oral Q8H   insulin aspart  0-5 Units Subcutaneous QHS   insulin aspart  0-9 Units Subcutaneous TID WC   insulin glargine  10 Units Subcutaneous QHS   isosorbide mononitrate  120 mg Oral Daily   omega-3 acid ethyl esters  1 g Oral BID   pantoprazole  40 mg Oral Daily   ranolazine  500 mg Oral BID   rivaroxaban  2.5 mg Oral BID   rosuvastatin  10 mg Oral Daily   sodium chloride flush  3 mL Intravenous Q12H   Continuous Infusions:  sodium chloride      Procedures/Studies: DG Chest Port 1 View  Result Date: 02/01/2020 CLINICAL DATA:  Shortness of breath, bilateral lower leg swelling. Coronary artery disease. EXAM: PORTABLE CHEST 1 VIEW COMPARISON:  Chest x-ray 12/09/2019 FINDINGS: Redemonstration of a left chest wall ICD implant in stable position. The heart size and mediastinal contours are unchanged. Persistent interstitial coarsening. Slightly decreased inspiratory effort  compared to prior. Suggestion of vague patchy airspace opacities within bilateral lower lung zones. Mild pulmonary edema. No pleural effusion. No pneumothorax. No acute osseous abnormality. IMPRESSION: 1. Vague patchy airspace opacities within bilateral lower lung zones could represent atelectasis versus infection/inflammation. 2. Mild pulmonary edema. Electronically Signed   By: Iven Finn M.D.   On: 02/01/2020 20:14   ECHOCARDIOGRAM COMPLETE  Result Date: 02/02/2020    ECHOCARDIOGRAM REPORT   Patient Name:   SAIR FAULCON Moncada Date of Exam: 02/02/2020 Medical Rec #:  [id number]       Height:       69.0 in Accession #:    [id number]      Weight:       222.4 lb Date of Birth:  [birth year]        BSA:          2.161 m Patient Age:    33 years        BP:  148/93 mmHg Patient Gender: M               HR:           82 bpm. Exam Location:  [facility] Procedure: 2D Echo, Cardiac Doppler, Color Doppler and Intracardiac            Opacification Agent Indications:    Dyspnea  History:        Patient has prior history of Echocardiogram examinations. CHF                 and Cardiomyopathy, CAD, Defibrillator; Risk                 Factors:Hypertension, Diabetes and Dyslipidemia. Polysubstance                 abuse.  Sonographer:    Dustin Flock RDCS Referring Phys: [place]  1. Left ventricular ejection fraction, by estimation, is 30 to 35%. The left ventricle has moderately decreased function. The left ventricle demonstrates global hypokinesis. The left ventricular internal cavity size was mildly dilated. There is mild left ventricular hypertrophy. Left ventricular diastolic parameters are consistent with Grade II diastolic dysfunction (pseudonormalization). Elevated left ventricular end-diastolic pressure.  2. Right ventricular systolic function is normal. The right ventricular size is normal. There is moderately elevated pulmonary artery systolic pressure.  3. Left atrial size was  moderately dilated.  4. The mitral valve is normal in structure. Trivial mitral valve regurgitation. No evidence of mitral stenosis.  5. Tricuspid valve regurgitation is moderate.  6. The aortic valve is tricuspid. Aortic valve regurgitation is not visualized. No aortic stenosis is present.  7. The inferior vena cava is normal in size with greater than 50% respiratory variability, suggesting right atrial pressure of 3 mmHg. FINDINGS  Left Ventricle: EF similar to TTE 01/22/19. Left ventricular ejection fraction, by estimation, is 30 to 35%. The left ventricle has moderately decreased function. The left ventricle demonstrates global hypokinesis. Definity contrast agent was given IV to  delineate the left ventricular endocardial borders. The left ventricular internal cavity size was mildly dilated. There is mild left ventricular hypertrophy. Left ventricular diastolic parameters are consistent with Grade II diastolic dysfunction (pseudonormalization). Elevated left ventricular end-diastolic pressure. Right Ventricle: The right ventricular size is normal. No increase in right ventricular wall thickness. Right ventricular systolic function is normal. There is moderately elevated pulmonary artery systolic pressure. The tricuspid regurgitant velocity is 3.44 m/s, and with an assumed right atrial pressure of 8 mmHg, the estimated right ventricular systolic pressure is 27.2 mmHg. Left Atrium: Left atrial size was moderately dilated. Right Atrium: Right atrial size was normal in size. Pericardium: There is no evidence of pericardial effusion. Mitral Valve: The mitral valve is normal in structure. Trivial mitral valve regurgitation. No evidence of mitral valve stenosis. Tricuspid Valve: The tricuspid valve is normal in structure. Tricuspid valve regurgitation is moderate . No evidence of tricuspid stenosis. Aortic Valve: The aortic valve is tricuspid. Aortic valve regurgitation is not visualized. No aortic stenosis is present.  Pulmonic Valve: The pulmonic valve was normal in structure. Pulmonic valve regurgitation is not visualized. No evidence of pulmonic stenosis. Aorta: The aortic root is normal in size and structure. Venous: The inferior vena cava is normal in size with greater than 50% respiratory variability, suggesting right atrial pressure of 3 mmHg. IAS/Shunts: No atrial level shunt detected by color flow Doppler.  LEFT VENTRICLE PLAX 2D LVIDd:  6.07 cm  Diastology LVIDs:         5.11 cm  LV e' medial:    5.22 cm/s LV PW:         1.22 cm  LV E/e' medial:  20.1 LV IVS:        1.25 cm  LV e' lateral:   4.13 cm/s LVOT diam:     2.40 cm  LV E/e' lateral: 25.4 LV SV:         65 LV SV Index:   30 LVOT Area:     4.52 cm  RIGHT VENTRICLE RV Basal diam:  3.35 cm RV S prime:     11.90 cm/s TAPSE (M-mode): 2.4 cm LEFT ATRIUM              Index       RIGHT ATRIUM           Index LA diam:        5.10 cm  2.36 cm/m  RA Area:     23.30 cm LA Vol (A2C):   105.0 ml 48.58 ml/m RA Volume:   75.90 ml  35.12 ml/m LA Vol (A4C):   69.6 ml  32.20 ml/m LA Biplane Vol: 85.8 ml  39.70 ml/m  AORTIC VALVE LVOT Vmax:   75.10 cm/s LVOT Vmean:  50.000 cm/s LVOT VTI:    0.143 m  AORTA Ao Root diam: 2.60 cm MITRAL VALVE                TRICUSPID VALVE MV Area (PHT): 5.23 cm     TR Peak grad:   47.3 mmHg MV Decel Time: 145 msec     TR Vmax:        344.00 cm/s MV E velocity: 105.00 cm/s MV A velocity: 57.80 cm/s   SHUNTS MV E/A ratio:  1.82         Systemic VTI:  0.14 m                             Systemic Diam: 2.40 cm Jenkins Rouge MD Electronically signed by Jenkins Rouge MD Signature Date/Time: 02/02/2020/11:33:09 AM    Final    CUP PACEART REMOTE DEVICE CHECK  Result Date: 01/19/2020 Scheduled remote reviewed. Normal device function.  Next remote 91 days.Sensing Configuration: Primary Gain Setting: 1X Post Shock Pacing: ON   Orson Eva, DO  [facility]  If 7PM-7AM, please contact night-coverage [email or web address] Password  [email or web address] 02/06/2020, 5:58 PM   LOS: 5 days

## 2020-02-06 NOTE — Progress Notes (Addendum)
Progress Note  Patient Name: Brad Mendoza Date of Encounter: 02/06/2020  Primary Cardiologist: Rozann Lesches, MD  Subjective   Feeling much better. Swelling still present but reports it is much improved. Eager to go home soon.  Inpatient Medications    Scheduled Meds:  aspirin EC  81 mg Oral Daily   carvedilol  6.25 mg Oral BID WC   hydrALAZINE  100 mg Oral Q8H   insulin aspart  0-5 Units Subcutaneous QHS   insulin aspart  0-9 Units Subcutaneous TID WC   insulin glargine  10 Units Subcutaneous QHS   isosorbide mononitrate  120 mg Oral Daily   omega-3 acid ethyl esters  1 g Oral BID   pantoprazole  40 mg Oral Daily   ranolazine  500 mg Oral BID   rivaroxaban  2.5 mg Oral BID   rosuvastatin  10 mg Oral Daily   sodium chloride flush  3 mL Intravenous Q12H   Continuous Infusions:  sodium chloride     PRN Meds: sodium chloride, acetaminophen, nitroGLYCERIN, ondansetron (ZOFRAN) IV, sodium chloride flush   Vital Signs    Vitals:   02/05/20 1400 02/05/20 2043 02/06/20 0448 02/06/20 0500  BP: 116/65 (!) 152/91 108/81   Pulse: 75 79 81   Resp: 18 18 18    Temp: 98.2 F (36.8 C) 98.7 F (37.1 C) 98.6 F (37 C)   TempSrc: Oral  Oral   SpO2: 97% 98% 94%   Weight:    98.7 kg  Height:        Intake/Output Summary (Last 24 hours) at 02/06/2020 0906 Last data filed at 02/06/2020 0500 Gross per 24 hour  Intake 480 ml  Output 2350 ml  Net -1870 ml   Last 3 Weights 02/06/2020 02/05/2020 02/01/2020  Weight (lbs) 217 lb 9.5 oz 217 lb 4.8 oz 222 lb 6.4 oz  Weight (kg) 98.7 kg 98.567 kg 100.88 kg     Telemetry    NSR - Personally Reviewed  Physical Exam   GEN: No acute distress.  HEENT: Normocephalic, atraumatic, sclera non-icteric. Neck: No JVD or bruits. Cardiac: RRR no murmurs, rubs, or gallops.  Radials/DP/PT 1+ and equal bilaterally.  Respiratory: Clear to auscultation bilaterally. Breathing is unlabored. GI: Soft, nontender,  non-distended, BS +x 4. MS: no deformity. Extremities: No clubbing or cyanosis. 1+ pitting BLE edema. Distal pedal pulses are 2+ and equal bilaterally. Neuro:  AAOx3. Follows commands. Psych:  Responds to questions appropriately with a normal affect.  Labs    High Sensitivity Troponin:   Recent Labs  Lab 02/01/20 1848  TROPONINIHS 41*      Cardiac EnzymesNo results for input(s): TROPONINI in the last 168 hours. No results for input(s): TROPIPOC in the last 168 hours.   Chemistry Recent Labs  Lab 02/01/20 1848 02/02/20 0714 02/04/20 0547 02/05/20 0550 02/06/20 0631  NA 139   < > 138 138 135  K 4.4   < > 3.9 4.2 4.4  CL 109   < > 107 107 104  CO2 23   < > 24 23 25   GLUCOSE 199*   < > 76 52* 107*  BUN 58*   < > 61* 59* 64*  CREATININE 2.61*   < > 2.85* 2.76* 3.35*  CALCIUM 8.4*   < > 7.9* 8.0* 7.9*  PROT 5.6*  --   --   --   --   ALBUMIN 2.5*  --   --   --   --   AST 30  --   --   --   --  ALT 31  --   --   --   --   ALKPHOS 105  --   --   --   --   BILITOT 0.4  --   --   --   --   GFRNONAA 27*   < > 24* 25* 20*  ANIONGAP 7   < > 7 8 6    < > = values in this interval not displayed.     Hematology Recent Labs  Lab 02/01/20 1848  WBC 6.6  RBC 4.03*  HGB 10.4*  HCT 33.8*  MCV 83.9  MCH 25.8*  MCHC 30.8  RDW 16.3*  PLT 284    BNP Recent Labs  Lab 02/01/20 1848 02/06/20 0631  BNP 472.0* 194.0*     DDimer No results for input(s): DDIMER in the last 168 hours.   Radiology    No results found.  Cardiac Studies   2D Echo 01/2020 IMPRESSIONS  1. Left ventricular ejection fraction, by estimation, is 30 to 35%. The  left ventricle has moderately decreased function. The left ventricle  demonstrates global hypokinesis. The left ventricular internal cavity size  was mildly dilated. There is mild  left ventricular hypertrophy. Left ventricular diastolic parameters are  consistent with Grade II diastolic dysfunction (pseudonormalization).  Elevated  left ventricular end-diastolic pressure.  2. Right ventricular systolic function is normal. The right ventricular  size is normal. There is moderately elevated pulmonary artery systolic  pressure.  3. Left atrial size was moderately dilated.  4. The mitral valve is normal in structure. Trivial mitral valve  regurgitation. No evidence of mitral stenosis.  5. Tricuspid valve regurgitation is moderate.  6. The aortic valve is tricuspid. Aortic valve regurgitation is not  visualized. No aortic stenosis is present.  7. The inferior vena cava is normal in size with greater than 50%  respiratory variability, suggesting right atrial pressure of 3 mmHg.   Patient Profile     64 y.o. male  CAD s/p prior DES to RCA 02/2018, CKD stage IV, HTN, chronic combined CHF, mixed cardiomyopathy, AICD 42021 ([employer]), DM2, chronic back pain who presented to [facility] with progressive edema. He had been managed recently as OP with increasing diuretic doses at the direction of nephrology. However, other notes also mention poor diuretic compliance. He was admitted for management of CHF.  Assessment & Plan    1. Acute on chronic combined CHF -01/2020 echo: LVEF 30-35%, grade II DDx, normal RV - exacerbation possibly due to diuretic noncompliance, also challenging due to CKD - he feels dry weight was likely around 210 at home but hard to know because diet was not great and possibly has gained body weight as well. Was 215-217 in October when swelling developed. 232 on admission here, down to 217 (similar to yesterday)  - has diuresed net -4.7L thus far per I/O's with -2950 yesterday after low dose metolazone - Cr has bumped though with this  - per  D/w Dr. Harl Bowie, hold diuretics today and reassess Cr in AM - continue carvedilol, hydralazine, isosorbide - avoid ACEi/ARB/ARNI due to CKD - low albumin 2.5 also may be contributing to edema so will place TED hose  2. CAD s/p prior PCI 02/2018 - no anginal symptoms this  admission - continue ASA, statin, BB, Ranexa (continue to follow renal function given Ranexa) - also on low dose Xarelto as part of [facility]ass trial per notes - will review dosing with MD given progressive renal insufficiency  3. AKI on CKD stage  IV - followed by nephrology as OP, hold Lasix - managed in context of the above  For questions or updates, please contact [place] Please consult [email or web address] for contact info under Cardiology/STEMI.  Signed, Charlie Pitter, PA-C 02/06/2020, 9:06 AM    Attending note Patient seen and discussed with PA Dunn, I agree with her documentation. Being managed for acute on chronic combined systolic/diastolic HF. Yesterday received IV lasix 80mg  bid and metolazone 2.5mg . Neg 2.1 L. Significant uptrend in Cr to 3.3, hold diuretics today. Ok with current xarelto dosing. Follow up renal function tomorrow, possible oral diuretic tomorrow and discharge given his SOB/DOE has resovled   Zandra Abts MD

## 2020-02-07 ENCOUNTER — Telehealth: Payer: Self-pay | Admitting: *Deleted

## 2020-02-07 DIAGNOSIS — N184 Chronic kidney disease, stage 4 (severe): Secondary | ICD-10-CM

## 2020-02-07 DIAGNOSIS — E1142 Type 2 diabetes mellitus with diabetic polyneuropathy: Secondary | ICD-10-CM

## 2020-02-07 DIAGNOSIS — N179 Acute kidney failure, unspecified: Secondary | ICD-10-CM

## 2020-02-07 DIAGNOSIS — I5031 Acute diastolic (congestive) heart failure: Secondary | ICD-10-CM

## 2020-02-07 DIAGNOSIS — Z794 Long term (current) use of insulin: Secondary | ICD-10-CM

## 2020-02-07 LAB — BASIC METABOLIC PANEL
Anion gap: 8 (ref 5–15)
BUN: 66 mg/dL — ABNORMAL HIGH (ref 8–23)
CO2: 24 mmol/L (ref 22–32)
Calcium: 8.3 mg/dL — ABNORMAL LOW (ref 8.9–10.3)
Chloride: 102 mmol/L (ref 98–111)
Creatinine, Ser: 3.22 mg/dL — ABNORMAL HIGH (ref 0.61–1.24)
GFR, Estimated: 21 mL/min — ABNORMAL LOW (ref >60)
Glucose, Bld: 107 mg/dL — ABNORMAL HIGH (ref 70–99)
Potassium: 4.4 mmol/L (ref 3.5–5.1)
Sodium: 134 mmol/L — ABNORMAL LOW (ref 135–145)

## 2020-02-07 LAB — MAGNESIUM: Magnesium: 2.5 mg/dL — ABNORMAL HIGH (ref 1.7–2.4)

## 2020-02-07 LAB — GLUCOSE, CAPILLARY
Glucose-Capillary: 110 mg/dL — ABNORMAL HIGH (ref 70–99)
Glucose-Capillary: 148 mg/dL — ABNORMAL HIGH (ref 70–99)

## 2020-02-07 MED ORDER — TORSEMIDE 20 MG PO TABS
60.0000 mg | ORAL_TABLET | Freq: Two times a day (BID) | ORAL | Status: DC
  Administered 2020-02-07: 14:00:00 60 mg via ORAL
  Filled 2020-02-07: qty 3

## 2020-02-07 MED ORDER — TOUJEO MAX SOLOSTAR 300 UNIT/ML ~~LOC~~ SOPN: 10.0000 [IU] | PEN_INJECTOR | Freq: Every day | SUBCUTANEOUS | 0 refills | Status: AC

## 2020-02-07 MED ORDER — TORSEMIDE 20 MG PO TABS: 60.0000 mg | ORAL_TABLET | Freq: Two times a day (BID) | ORAL | 1 refills | Status: AC

## 2020-02-07 NOTE — Progress Notes (Signed)
Progress Note  Patient Name: Brad Mendoza Date of Encounter: 02/07/2020  [facility] Cardiologist: Rozann Lesches, MD   Subjective   Some ongoing SOB  Inpatient Medications    Scheduled Meds: . aspirin EC  81 mg Oral Daily  . carvedilol  6.25 mg Oral BID WC  . hydrALAZINE  100 mg Oral Q8H  . insulin aspart  0-5 Units Subcutaneous QHS  . insulin aspart  0-9 Units Subcutaneous TID WC  . insulin glargine  10 Units Subcutaneous QHS  . isosorbide mononitrate  120 mg Oral Daily  . omega-3 acid ethyl esters  1 g Oral BID  . pantoprazole  40 mg Oral Daily  . ranolazine  500 mg Oral BID  . rivaroxaban  2.5 mg Oral BID  . rosuvastatin  10 mg Oral Daily  . sodium chloride flush  3 mL Intravenous Q12H   Continuous Infusions: . sodium chloride     PRN Meds: sodium chloride, acetaminophen, nitroGLYCERIN, ondansetron (ZOFRAN) IV, sodium chloride flush   Vital Signs    Vitals:   02/06/20 0500 02/06/20 1416 02/06/20 2025 02/07/20 0521  BP:  117/70 (!) 154/88 139/86  Pulse:  71 78 72  Resp:  18 18 18   Temp:  98.5 F (36.9 C) 98.4 F (36.9 C) (!) 97.3 F (36.3 C)  TempSrc:  Oral Oral Oral  SpO2:  100% 99% 97%  Weight: 98.7 kg     Height:        Intake/Output Summary (Last 24 hours) at 02/07/2020 0909 Last data filed at 02/07/2020 0700 Gross per 24 hour  Intake 720 ml  Output 1150 ml  Net -430 ml   Last 3 Weights 02/06/2020 02/05/2020 02/01/2020  Weight (lbs) 217 lb 9.5 oz 217 lb 4.8 oz 222 lb 6.4 oz  Weight (kg) 98.7 kg 98.567 kg 100.88 kg      Telemetry    SR- Personally Reviewed  ECG    n/a - Personally Reviewed  Physical Exam   GEN: No acute distress.   Neck: No JVD Cardiac: RRR, no murmurs, rubs, or gallops.  Respiratory: Clear to auscultation bilaterally. GI: Soft, nontender, non-distended  MS: 1+ bilateral LE edema. Neuro:  Nonfocal  Psych: Normal affect   Labs    High Sensitivity Troponin:   Recent Labs  Lab 02/01/20 1848   TROPONINIHS 41*      Chemistry Recent Labs  Lab 02/01/20 1848 02/02/20 0714 02/05/20 0550 02/06/20 0631 02/07/20 0539  NA 139   < > 138 135 134*  K 4.4   < > 4.2 4.4 4.4  CL 109   < > 107 104 102  CO2 23   < > 23 25 24   GLUCOSE 199*   < > 52* 107* 107*  BUN 58*   < > 59* 64* 66*  CREATININE 2.61*   < > 2.76* 3.35* 3.22*  CALCIUM 8.4*   < > 8.0* 7.9* 8.3*  PROT 5.6*  --   --   --   --   ALBUMIN 2.5*  --   --   --   --   AST 30  --   --   --   --   ALT 31  --   --   --   --   ALKPHOS 105  --   --   --   --   BILITOT 0.4  --   --   --   --   GFRNONAA 27*   < >  25* 20* 21*  ANIONGAP 7   < > 8 6 8    < > = values in this interval not displayed.     Hematology Recent Labs  Lab 02/01/20 1848  WBC 6.6  RBC 4.03*  HGB 10.4*  HCT 33.8*  MCV 83.9  MCH 25.8*  MCHC 30.8  RDW 16.3*  PLT 284    BNP Recent Labs  Lab 02/01/20 1848 02/06/20 0631  BNP 472.0* 194.0*     DDimer No results for input(s): DDIMER in the last 168 hours.   Radiology    No results found.  Cardiac Studies     Patient Profile  65 y.o. male  CAD s/p prior DES to RCA 02/2018, CKD stage IV, HTN, chronic combined CHF, mixed cardiomyopathy, AICD 42021 ([employer]), DM2, chronic back pain who presented to [facility] with progressive edema. He had been managed recently as OP with increasing diuretic doses at the direction of nephrology. However, other notes also mention poor diuretic compliance. He was admitted for management of CHF.  Assessment & Plan    1. Acute on chronic combined systolic/diastolic HF - -[id number] echo: LVEF 30-35%, grade II DDx, normal RV - exacerbation possibly due to diuretic noncompliance, also challenging due to CKD - he feels dry weight was likely around 210 at home but hard to know because diet was not great and possibly has gained body weight as well. Was 215-217 in October when swelling developed. 232 on admission here, down to 217 as of yesterday  - diuretics held yesterday due  to uptrend in Cr. Some mild increased SOB since yesterday.  Will start oral torsemide 60mg  bid - - continue carvedilol, hydralazine, isosorbide - avoid ACEi/ARB/ARNI due to CKD - low albumin 2.5 also may be contributing to edema - ambulate patient, if does well would be ok for discharge.    2. CAD s/p prior PCI 02/2018 - no anginal symptoms this admission - continue ASA, statin, BB, Ranexa (continue to follow renal function given Ranexa) - also on low dose Xarelto as part of [facility] trial  3. AKI on CKD IV - diuretics held yesterday, significant elevation aftering getting IV lasix and oral metolazone. - trending back down today, not yet back to baseline.    Likely discharge today if does well with ambulation   For questions or updates, please contact [place] Please consult [email or web address] for contact info under        Signed, Carlyle Dolly, MD  02/07/2020, 9:10 AM

## 2020-02-07 NOTE — Telephone Encounter (Signed)
Contact Date: 02/07/2020 Contacted By: Lynnea Ferrier, [name]  Transition Care Management Follow-up Telephone Call  Date of discharge and from where: 02/02/20 [facility]   Discharge Diagnosis:Acute on chronic combined CHF, coronary artery disease  How have you been since you were released from the hospital? Stable  Any questions or concerns? No   Items Reviewed:  Did the pt receive and understand the discharge instructions provided? Yes   Medications obtained and verified? Yes   Any new allergies since your discharge? No   Dietary orders reviewed? Yes  Do you have support at home? Yes   Discontinued Medications STOP taking these medications   nabumetone 500 MG tablet Commonly known as: RELAFEN   potassium chloride SA 20 MEQ tablet Commonly known as: KLOR-CON    New Medications Added Xarelto 2.5 MG Tabs tablet Generic drug: rivaroxaban TAKE (1) TABLET BY MOUTH TWICE DAILY. What changed: See the new instructions. Commonly known as: DEMADEX Take 3 tablets (60 mg total) by mouth 2 (two) times daily. What changed:   medication strength  how much to take  when to take this Toujeo Max SoloStar 300 UNIT/ML Solostar Pen Generic drug: insulin glargine (2 Unit Dial) Inject 10 Units into the skin at bedtime. What changed:   how much to take  how to take this  when to take this  additional instructions Current Medication List Allergies as of 02/07/2020      Reactions   Sulfa Antibiotics Shortness Of Breath   Penicillins Rash, Other (See Comments)   Has patient had a PCN reaction causing immediate rash, facial/tongue/throat swelling, SOB or lightheadedness with hypotension: No Has patient had a PCN reaction causing severe rash involving mucus membranes or skin necrosis: No Has patient had a PCN reaction that required hospitalization No Has patient had a PCN reaction occurring within the last 10 years: No If all of the above answers are "NO", then may  proceed with Cephalosporin use.      Medication List       Accurate as of February 07, 2020  3:55 PM. If you have any questions, ask your nurse or doctor.        aspirin EC 81 MG tablet Take 81 mg by mouth daily.   blood glucose meter kit and supplies Kit Dispense based on patient and insurance preference. Test four times daily as directed. (FOR ICD-10 : E11.9   carvedilol 6.25 MG tablet Commonly known as: COREG Take 1 tablet (6.25 mg total) by mouth 2 (two) times daily with a meal.   CENTRUM SILVER ADULT 50+ PO Take 1 tablet by mouth daily.   Fish Oil 1000 MG Cpdr Take 1,000 mg by mouth daily.   fluticasone 50 MCG/ACT nasal spray Commonly known as: FLONASE INSTILL 2 SPRAYS INTO EACH NOSTRIL DAILY. What changed: See the new instructions.   FreeStyle Libre 2 Sensor Misc Use to test blood sugar up to 6 times daily as directed. E11.9   hydrALAZINE 100 MG tablet Commonly known as: APRESOLINE TAKE (1) TABLET BY MOUTH (3) TIMES DAILY. What changed: See the new instructions.   icosapent Ethyl 1 g capsule Commonly known as: Vascepa Take 2 capsules (2 g total) by mouth 2 (two) times daily.   ICY HOT ARTHRITIS THERAPY EX Apply 1 application topically 2 (two) times daily as needed (back pain).   isosorbide mononitrate 120 MG 24 hr tablet Commonly known as: IMDUR TAKE (1) TABLET BY MOUTH ONCE DAILY. What changed: See the new instructions.   loratadine  10 MG tablet Commonly known as: CLARITIN Take 1 tablet (10 mg total) by mouth daily.   nitroGLYCERIN 0.4 MG SL tablet Commonly known as: NITROSTAT DISSOLVE 1 TAB UNDER TOUNGE FOR CHEST PAIN. MAY REPEAT EVERY 5 MINUTES FOR 3 DOSES. IF NO RELIEF CALL 911 OR GO TO ER   onetouch ultrasoft lancets Use as instructed   DX E11.9   OneTouch Verio test strip Generic drug: glucose blood USE FOUR TIMES DAILY AS NEEDED Dx E11.9   OneTouch Verio w/Device Kit 1 Device by Does not apply route 2 (two) times daily.   Ozempic (1  MG/DOSE) 2 MG/1.5ML Sopn Generic drug: Semaglutide (1 MG/DOSE) Inject 1 mg into the skin once a week.   pantoprazole 40 MG tablet Commonly known as: PROTONIX Take 1 tablet (40 mg total) by mouth daily.   pregabalin 200 MG capsule Commonly known as: LYRICA Take 2 capsules (400 mg total) by mouth at bedtime.   ProAir HFA 108 (90 Base) MCG/ACT inhaler Generic drug: albuterol USE 2 PUFFS EVERY 6 HOURS AS NEEDED   ranolazine 500 MG 12 hr tablet Commonly known as: RANEXA TAKE 1 TABLET BY MOUTH TWICE DAILY.   rosuvastatin 10 MG tablet Commonly known as: CRESTOR TAKE 1 TABLET BY MOUTH ONCE A DAY.   terbinafine 1 % cream Commonly known as: LAMISIL Apply 1 application topically 2 (two) times daily. Apply to both feet and between toes   tiZANidine 4 MG tablet Commonly known as: ZANAFLEX Take 1 tablet (4 mg total) by mouth every 6 (six) hours as needed for muscle spasms.   torsemide 20 MG tablet Commonly known as: DEMADEX Take 3 tablets (60 mg total) by mouth 2 (two) times daily.   Toujeo Max SoloStar 300 UNIT/ML Solostar Pen Generic drug: insulin glargine (2 Unit Dial) Inject 10 Units into the skin at bedtime.   Vitamin D (Ergocalciferol) 1.25 MG (50000 UNIT) Caps capsule Commonly known as: DRISDOL Take 1 capsule (50,000 Units total) by mouth every 7 (seven) days. What changed: when to take this   Vitamin D3 25 MCG (1000 UT) Caps Take 2,000 Units by mouth daily.   Xarelto 2.5 MG Tabs tablet Generic drug: rivaroxaban TAKE (1) TABLET BY MOUTH TWICE DAILY. What changed: See the new instructions.        Home Care and Equipment/Supplies: Were home health services ordered? no If so, what is the name of the agency?   Has the agency set up a time to come to the patient's home? not applicable Were any new equipment or medical supplies ordered?  No What is the name of the medical supply agency? na Were you able to get the supplies/equipment? not applicable Do you have any  questions related to the use of the equipment or supplies? No  Functional Questionnaire: (I = Independent and D = Dependent) ADLs: I  Bathing/Dressing- I  Meal Prep- I  Eating- I  Maintaining continence- I  Transferring/Ambulation- I  Managing Meds- I    Follow up appointments reviewed:   PCP Hospital f/u appt confirmed? Yes  Scheduled to see Evelina Dun, FNP on 02/13/20 @ 2:10pm.  [facility] f/u appt confirmed? Yes  Scheduled to see Clyda Hurdle on 02/28/19 @ 2:30p.  Are transportation arrangements needed? No   If their condition worsens, is the pt aware to call PCP or go to the Emergency Dept.? Yes  Was the patient provided with contact information for the PCP's office or ED? Yes  Was to pt encouraged to call back  with questions or concerns? Yes

## 2020-02-07 NOTE — Discharge Summary (Signed)
Physician Discharge Summary  Brad Mendoza PJA:[id number] DOB: 1955-10-04 DOA: 02/01/2020  PCP: [name], MD  Admit date: 02/01/2020 Discharge date: 02/07/2020  Admitted From: Home Disposition:  Home   Recommendations for Outpatient Follow-up:  1. Follow up with PCP in 1-2 weeks 2. Please obtain BMP/CBC in one week    Discharge Condition: Stable CODE STATUS: FULL Diet recommendation: Heart Healthy / Carb Modified    Brief/Interim Summary: 64 year old male with a history of cardiomyopathy, ejection fraction of 30 to 35%, coronary artery diseasestatus post RCA stent, chronic kidney disease stage IV,AICDpresents to the hospital with volume overload. He was noted to have significant weight gain and worsening lower extremity edema. .patient been followed closely recently by cardiology and nephrology Dr. [name] due to retention, and weight gain, where his torsemide has been gradually uptitrated over last 2 months from 40 mg once daily to 100 mg once daily by Dr. [name], as well fluid gain from [id number] Dr. [name] record, patient presents to ED secondary to worsening shortness of breath, orthopnea and exertional dyspnea, worsening lower extremity over the last few weeks, He was admitted for IV diuresis.  Discharge Diagnoses:  Acute on chronic combined CHF -02/02/20 Echo--EF 30 to 35%,global HK, G2DD, mod TR -Patient reports compliance with medication, though he may have had some dietary indiscretions -Continue intravenous Lasix80 IV bid>>hold on 12/16 -Neg 5.1 L for the admission, but I/Os incomplete -previous Dry weight is approximately 207-210 pounds at home. Weight on admission 232 pounds on day of admission -discharge standing weight 217.9 -suspect weight gain partly due to excess calories/dietary indiscretions -Continue on Coreg, hydralazine, Imdur, not on Entresto due to renal insufficiency -Cardiology following, appreciate input -cleared by cardiology  for d/c with torsemide 60 mg po bid  coronary artery disease -Status post DES to proximal RCA in 02/2018, on Xarelto 2.5 mg twice daily, aspirin, Coreg, Ranexa, Imdur as part of the [facility] protocol -no chest pain presently  Hyperlipidemia.  -Continue statin  Acute on Chronic kidney disease stage IV -Baseline creatinine approximately 2.4-2.6 -Creatinine trending up with diuresis-->hold lasix IV 12/16 -Continue to monitor in setting of diuresis -We will likely need to tolerate a higher creatinine to achieve diuresis -serum creatinine 3.22 on day of d/c  Diabetes, type II,uncontrolled with hyperglycemia -12/11 A1C--8.1 -no having more hypoglycemia with increasing creatinine -decrease -Currently on Lantus and sliding scale insulin -decreased lantus to 10 units daily due to multiple hypoglycemic episodes -hypoglycemia due to worsening renal function resulting in longer insulin half-life  Essential Hypertension -continue hydralazine, imdur, coreg   Discharge Instructions   Allergies as of 02/07/2020      Reactions   Sulfa Antibiotics Shortness Of Breath   Penicillins Rash, Other (See Comments)   Has patient had a PCN reaction causing immediate rash, facial/tongue/throat swelling, SOB or lightheadedness with hypotension: No Has patient had a PCN reaction causing severe rash involving mucus membranes or skin necrosis: No Has patient had a PCN reaction that required hospitalization No Has patient had a PCN reaction occurring within the last 10 years: No If all of the above answers are "NO", then may proceed with Cephalosporin use.      Medication List    STOP taking these medications   nabumetone 500 MG tablet Commonly known as: RELAFEN   potassium chloride SA 20 MEQ tablet Commonly known as: KLOR-CON     TAKE these medications   aspirin EC 81 MG tablet Take 81 mg by mouth daily.   blood glucose meter kit  and supplies Kit Dispense based on patient and  insurance preference. Test four times daily as directed. (FOR ICD-10 : E11.9   carvedilol 6.25 MG tablet Commonly known as: COREG Take 1 tablet (6.25 mg total) by mouth 2 (two) times daily with a meal.   CENTRUM SILVER ADULT 50+ PO Take 1 tablet by mouth daily.   Fish Oil 1000 MG Cpdr Take 1,000 mg by mouth daily.   fluticasone 50 MCG/ACT nasal spray Commonly known as: FLONASE INSTILL 2 SPRAYS INTO EACH NOSTRIL DAILY. What changed: See the new instructions.   FreeStyle Libre 2 Sensor Misc Use to test blood sugar up to 6 times daily as directed. E11.9   hydrALAZINE 100 MG tablet Commonly known as: APRESOLINE TAKE (1) TABLET BY MOUTH (3) TIMES DAILY. What changed: See the new instructions.   icosapent Ethyl 1 g capsule Commonly known as: Vascepa Take 2 capsules (2 g total) by mouth 2 (two) times daily.   ICY HOT ARTHRITIS THERAPY EX Apply 1 application topically 2 (two) times daily as needed (back pain).   isosorbide mononitrate 120 MG 24 hr tablet Commonly known as: IMDUR TAKE (1) TABLET BY MOUTH ONCE DAILY. What changed: See the new instructions.   loratadine 10 MG tablet Commonly known as: CLARITIN Take 1 tablet (10 mg total) by mouth daily.   nitroGLYCERIN 0.4 MG SL tablet Commonly known as: NITROSTAT DISSOLVE 1 TAB UNDER TOUNGE FOR CHEST PAIN. MAY REPEAT EVERY 5 MINUTES FOR 3 DOSES. IF NO RELIEF CALL 911 OR GO TO ER   onetouch ultrasoft lancets Use as instructed   DX E11.9   OneTouch Verio test strip Generic drug: glucose blood USE FOUR TIMES DAILY AS NEEDED Dx E11.9   OneTouch Verio w/Device Kit 1 Device by Does not apply route 2 (two) times daily.   Ozempic (1 MG/DOSE) 2 MG/1.5ML Sopn Generic drug: Semaglutide (1 MG/DOSE) Inject 1 mg into the skin once a week.   pantoprazole 40 MG tablet Commonly known as: PROTONIX Take 1 tablet (40 mg total) by mouth daily.   pregabalin 200 MG capsule Commonly known as: LYRICA Take 2 capsules (400 mg total) by  mouth at bedtime.   ProAir HFA 108 (90 Base) MCG/ACT inhaler Generic drug: albuterol USE 2 PUFFS EVERY 6 HOURS AS NEEDED   ranolazine 500 MG 12 hr tablet Commonly known as: RANEXA TAKE 1 TABLET BY MOUTH TWICE DAILY.   rosuvastatin 10 MG tablet Commonly known as: CRESTOR TAKE 1 TABLET BY MOUTH ONCE A DAY.   terbinafine 1 % cream Commonly known as: LAMISIL Apply 1 application topically 2 (two) times daily. Apply to both feet and between toes   tiZANidine 4 MG tablet Commonly known as: ZANAFLEX Take 1 tablet (4 mg total) by mouth every 6 (six) hours as needed for muscle spasms.   torsemide 20 MG tablet Commonly known as: DEMADEX Take 3 tablets (60 mg total) by mouth 2 (two) times daily. What changed:   medication strength  how much to take  when to take this   Toujeo Max SoloStar 300 UNIT/ML Solostar Pen Generic drug: insulin glargine (2 Unit Dial) Inject 10 Units into the skin at bedtime. What changed:   how much to take  how to take this  when to take this  additional instructions   Vitamin D (Ergocalciferol) 1.25 MG (50000 UNIT) Caps capsule Commonly known as: DRISDOL Take 1 capsule (50,000 Units total) by mouth every 7 (seven) days. What changed: when to take this   Vitamin  D3 25 MCG (1000 UT) Caps Take 2,000 Units by mouth daily.   Xarelto 2.5 MG Tabs tablet Generic drug: rivaroxaban TAKE (1) TABLET BY MOUTH TWICE DAILY. What changed: See the new instructions.       Follow-up Information    Erma Heritage, PA-C On 02/28/2020.   Specialties: Physician Assistant, Cardiology Why: at 2:30 pm Contact information: [place] [address] [phone]        [facility]. Go on 02/13/2020.   Why: Please arrive by 2:00PM for a hospital follow up appointment with Dr. Livia Snellen' nurse practitioner Alyse Low. Contact information: [place] [phone]              Allergies  Allergen Reactions  . Sulfa Antibiotics Shortness Of Breath  . Penicillins Rash and Other (See Comments)    Has patient had a PCN reaction causing immediate rash, facial/tongue/throat swelling, SOB or lightheadedness with hypotension: No Has patient had a PCN reaction causing severe rash involving mucus membranes or skin necrosis: No Has patient had a PCN reaction that required hospitalization No Has patient had a PCN reaction occurring within the last 10 years: No If all of the above answers are "NO", then may proceed with Cephalosporin use.     Consultations:  cardiology   Procedures/Studies: DG Chest Port 1 View  Result Date: 02/01/2020 CLINICAL DATA:  Shortness of breath, bilateral lower leg swelling. Coronary artery disease. EXAM: PORTABLE CHEST 1 VIEW COMPARISON:  Chest x-ray 12/09/2019 FINDINGS: Redemonstration of a left chest wall ICD implant in stable position. The heart size and mediastinal contours are unchanged. Persistent interstitial coarsening. Slightly decreased inspiratory effort compared to prior. Suggestion of vague patchy airspace opacities within bilateral lower lung zones. Mild pulmonary edema. No pleural effusion. No pneumothorax. No acute osseous abnormality. IMPRESSION: 1. Vague patchy airspace opacities within bilateral lower lung zones could represent atelectasis versus infection/inflammation. 2. Mild pulmonary edema. Electronically Signed   By: Iven Finn M.D.   On: 02/01/2020 20:14   ECHOCARDIOGRAM COMPLETE  Result Date: 02/02/2020    ECHOCARDIOGRAM REPORT   Patient Name:   Brad Mendoza Coppa Date of Exam: 02/02/2020 Medical Rec #:  [id number]       Height:       69.0 in Accession #:    [id number]      Weight:       222.4 lb Date of Birth:  Jul 19, 1955        BSA:          2.161 m Patient Age:    9 years        BP:           148/93 mmHg Patient Gender: M               HR:           82 bpm. Exam Location:  [facility] Procedure: 2D Echo, Cardiac  Doppler, Color Doppler and Intracardiac            Opacification Agent Indications:    Dyspnea  History:        Patient has prior history of Echocardiogram examinations. CHF                 and Cardiomyopathy, CAD, Defibrillator; Risk                 Factors:Hypertension, Diabetes and Dyslipidemia. Polysubstance  abuse.  Sonographer:    Dustin Flock RDCS Referring Phys: [place]  1. Left ventricular ejection fraction, by estimation, is 30 to 35%. The left ventricle has moderately decreased function. The left ventricle demonstrates global hypokinesis. The left ventricular internal cavity size was mildly dilated. There is mild left ventricular hypertrophy. Left ventricular diastolic parameters are consistent with Grade II diastolic dysfunction (pseudonormalization). Elevated left ventricular end-diastolic pressure.  2. Right ventricular systolic function is normal. The right ventricular size is normal. There is moderately elevated pulmonary artery systolic pressure.  3. Left atrial size was moderately dilated.  4. The mitral valve is normal in structure. Trivial mitral valve regurgitation. No evidence of mitral stenosis.  5. Tricuspid valve regurgitation is moderate.  6. The aortic valve is tricuspid. Aortic valve regurgitation is not visualized. No aortic stenosis is present.  7. The inferior vena cava is normal in size with greater than 50% respiratory variability, suggesting right atrial pressure of 3 mmHg. FINDINGS  Left Ventricle: EF similar to TTE 01/22/19. Left ventricular ejection fraction, by estimation, is 30 to 35%. The left ventricle has moderately decreased function. The left ventricle demonstrates global hypokinesis. Definity contrast agent was given IV to  delineate the left ventricular endocardial borders. The left ventricular internal cavity size was mildly dilated. There is mild left ventricular hypertrophy. Left ventricular diastolic parameters are  consistent with Grade II diastolic dysfunction (pseudonormalization). Elevated left ventricular end-diastolic pressure. Right Ventricle: The right ventricular size is normal. No increase in right ventricular wall thickness. Right ventricular systolic function is normal. There is moderately elevated pulmonary artery systolic pressure. The tricuspid regurgitant velocity is 3.44 m/s, and with an assumed right atrial pressure of 8 mmHg, the estimated right ventricular systolic pressure is 17.0 mmHg. Left Atrium: Left atrial size was moderately dilated. Right Atrium: Right atrial size was normal in size. Pericardium: There is no evidence of pericardial effusion. Mitral Valve: The mitral valve is normal in structure. Trivial mitral valve regurgitation. No evidence of mitral valve stenosis. Tricuspid Valve: The tricuspid valve is normal in structure. Tricuspid valve regurgitation is moderate . No evidence of tricuspid stenosis. Aortic Valve: The aortic valve is tricuspid. Aortic valve regurgitation is not visualized. No aortic stenosis is present. Pulmonic Valve: The pulmonic valve was normal in structure. Pulmonic valve regurgitation is not visualized. No evidence of pulmonic stenosis. Aorta: The aortic root is normal in size and structure. Venous: The inferior vena cava is normal in size with greater than 50% respiratory variability, suggesting right atrial pressure of 3 mmHg. IAS/Shunts: No atrial level shunt detected by color flow Doppler.  LEFT VENTRICLE PLAX 2D LVIDd:         6.07 cm  Diastology LVIDs:         5.11 cm  LV e' medial:    5.22 cm/s LV PW:         1.22 cm  LV E/e' medial:  20.1 LV IVS:        1.25 cm  LV e' lateral:   4.13 cm/s LVOT diam:     2.40 cm  LV E/e' lateral: 25.4 LV SV:         65 LV SV Index:   30 LVOT Area:     4.52 cm  RIGHT VENTRICLE RV Basal diam:  3.35 cm RV S prime:     11.90 cm/s TAPSE (M-mode): 2.4 cm LEFT ATRIUM              Index  RIGHT ATRIUM           Index LA diam:         5.10 cm  2.36 cm/m  RA Area:     23.30 cm LA Vol (A2C):   105.0 ml 48.58 ml/m RA Volume:   75.90 ml  35.12 ml/m LA Vol (A4C):   69.6 ml  32.20 ml/m LA Biplane Vol: 85.8 ml  39.70 ml/m  AORTIC VALVE LVOT Vmax:   75.10 cm/s LVOT Vmean:  50.000 cm/s LVOT VTI:    0.143 m  AORTA Ao Root diam: 2.60 cm MITRAL VALVE                TRICUSPID VALVE MV Area (PHT): 5.23 cm     TR Peak grad:   47.3 mmHg MV Decel Time: 145 msec     TR Vmax:        344.00 cm/s MV E velocity: 105.00 cm/s MV A velocity: 57.80 cm/s   SHUNTS MV E/A ratio:  1.82         Systemic VTI:  0.14 m                             Systemic Diam: 2.40 cm Jenkins Rouge MD Electronically signed by Jenkins Rouge MD Signature Date/Time: 02/02/2020/11:33:09 AM    Final    CUP PACEART REMOTE DEVICE CHECK  Result Date: 01/19/2020 Scheduled remote reviewed. Normal device function.  Next remote 91 days.Sensing Configuration: Primary Gain Setting: 1X Post Shock Pacing: ON        Discharge Exam: Vitals:   02/06/20 2025 02/07/20 0521  BP: (!) 154/88 139/86  Pulse: 78 72  Resp: 18 18  Temp: 98.4 F (36.9 C) (!) 97.3 F (36.3 C)  SpO2: 99% 97%   Vitals:   02/06/20 0500 02/06/20 1416 02/06/20 2025 02/07/20 0521  BP:  117/70 (!) 154/88 139/86  Pulse:  71 78 72  Resp:  _0 Temp:  98.5 F (36.9 C) 98.4 F (36.9 C) (!) 97.3 F (36.3 C)  TempSrc:  Oral Oral Oral  SpO2:  100% 99% 97%  Weight: 98.7 kg     Height:        General: Pt is alert, awake, not in acute distress Cardiovascular: RRR, S1/S2 +, no rubs, no gallops Respiratory: bibasilar crackles. No wheeze Abdominal: Soft, NT, ND, bowel sounds + Extremities: 1+LE  edema, no cyanosis   The results of significant diagnostics from this hospitalization (including imaging, microbiology, ancillary and laboratory) are listed below for reference.    Significant Diagnostic Studies: DG Chest Port 1 View  Result Date: 02/01/2020 CLINICAL DATA:  Shortness of breath, bilateral lower  leg swelling. Coronary artery disease. EXAM: PORTABLE CHEST 1 VIEW COMPARISON:  Chest x-ray 12/09/2019 FINDINGS: Redemonstration of a left chest wall ICD implant in stable position. The heart size and mediastinal contours are unchanged. Persistent interstitial coarsening. Slightly decreased inspiratory effort compared to prior. Suggestion of vague patchy airspace opacities within bilateral lower lung zones. Mild pulmonary edema. No pleural effusion. No pneumothorax. No acute osseous abnormality. IMPRESSION: 1. Vague patchy airspace opacities within bilateral lower lung zones could represent atelectasis versus infection/inflammation. 2. Mild pulmonary edema. Electronically Signed   By: Iven Finn M.D.   On: 02/01/2020 20:14   ECHOCARDIOGRAM COMPLETE  Result Date: 02/02/2020    ECHOCARDIOGRAM REPORT   Patient Name:   Brad Mendoza Date of Exam: 02/02/2020 Medical Rec #:  [id number]       Height:       69.0 in Accession #:    [id number]      Weight:       222.4 lb Date of Birth:  Dec 15, 1955        BSA:          2.161 m Patient Age:    89 years        BP:           148/93 mmHg Patient Gender: M               HR:           82 bpm. Exam Location:  [facility] Procedure: 2D Echo, Cardiac Doppler, Color Doppler and Intracardiac            Opacification Agent Indications:    Dyspnea  History:        Patient has prior history of Echocardiogram examinations. CHF                 and Cardiomyopathy, CAD, Defibrillator; Risk                 Factors:Hypertension, Diabetes and Dyslipidemia. Polysubstance                 abuse.  Sonographer:    Dustin Flock RDCS Referring Phys: [place]  1. Left ventricular ejection fraction, by estimation, is 30 to 35%. The left ventricle has moderately decreased function. The left ventricle demonstrates global hypokinesis. The left ventricular internal cavity size was mildly dilated. There is mild left ventricular hypertrophy. Left ventricular diastolic  parameters are consistent with Grade II diastolic dysfunction (pseudonormalization). Elevated left ventricular end-diastolic pressure.  2. Right ventricular systolic function is normal. The right ventricular size is normal. There is moderately elevated pulmonary artery systolic pressure.  3. Left atrial size was moderately dilated.  4. The mitral valve is normal in structure. Trivial mitral valve regurgitation. No evidence of mitral stenosis.  5. Tricuspid valve regurgitation is moderate.  6. The aortic valve is tricuspid. Aortic valve regurgitation is not visualized. No aortic stenosis is present.  7. The inferior vena cava is normal in size with greater than 50% respiratory variability, suggesting right atrial pressure of 3 mmHg. FINDINGS  Left Ventricle: EF similar to TTE 01/22/19. Left ventricular ejection fraction, by estimation, is 30 to 35%. The left ventricle has moderately decreased function. The left ventricle demonstrates global hypokinesis. Definity contrast agent was given IV to  delineate the left ventricular endocardial borders. The left ventricular internal cavity size was mildly dilated. There is mild left ventricular hypertrophy. Left ventricular diastolic parameters are consistent with Grade II diastolic dysfunction (pseudonormalization). Elevated left ventricular end-diastolic pressure. Right Ventricle: The right ventricular size is normal. No increase in right ventricular wall thickness. Right ventricular systolic function is normal. There is moderately elevated pulmonary artery systolic pressure. The tricuspid regurgitant velocity is 3.44 m/s, and with an assumed right atrial pressure of 8 mmHg, the estimated right ventricular systolic pressure is 56.3 mmHg. Left Atrium: Left atrial size was moderately dilated. Right Atrium: Right atrial size was normal in size. Pericardium: There is no evidence of pericardial effusion. Mitral Valve: The mitral valve is normal in structure. Trivial mitral valve  regurgitation. No evidence of mitral valve stenosis. Tricuspid Valve: The tricuspid valve is normal in structure. Tricuspid valve regurgitation is moderate . No evidence of tricuspid stenosis. Aortic Valve: The aortic valve is tricuspid. Aortic  valve regurgitation is not visualized. No aortic stenosis is present. Pulmonic Valve: The pulmonic valve was normal in structure. Pulmonic valve regurgitation is not visualized. No evidence of pulmonic stenosis. Aorta: The aortic root is normal in size and structure. Venous: The inferior vena cava is normal in size with greater than 50% respiratory variability, suggesting right atrial pressure of 3 mmHg. IAS/Shunts: No atrial level shunt detected by color flow Doppler.  LEFT VENTRICLE PLAX 2D LVIDd:         6.07 cm  Diastology LVIDs:         5.11 cm  LV e' medial:    5.22 cm/s LV PW:         1.22 cm  LV E/e' medial:  20.1 LV IVS:        1.25 cm  LV e' lateral:   4.13 cm/s LVOT diam:     2.40 cm  LV E/e' lateral: 25.4 LV SV:         65 LV SV Index:   30 LVOT Area:     4.52 cm  RIGHT VENTRICLE RV Basal diam:  3.35 cm RV S prime:     11.90 cm/s TAPSE (M-mode): 2.4 cm LEFT ATRIUM              Index       RIGHT ATRIUM           Index LA diam:        5.10 cm  2.36 cm/m  RA Area:     23.30 cm LA Vol (A2C):   105.0 ml 48.58 ml/m RA Volume:   75.90 ml  35.12 ml/m LA Vol (A4C):   69.6 ml  32.20 ml/m LA Biplane Vol: 85.8 ml  39.70 ml/m  AORTIC VALVE LVOT Vmax:   75.10 cm/s LVOT Vmean:  50.000 cm/s LVOT VTI:    0.143 m  AORTA Ao Root diam: 2.60 cm MITRAL VALVE                TRICUSPID VALVE MV Area (PHT): 5.23 cm     TR Peak grad:   47.3 mmHg MV Decel Time: 145 msec     TR Vmax:        344.00 cm/s MV E velocity: 105.00 cm/s MV A velocity: 57.80 cm/s   SHUNTS MV E/A ratio:  1.82         Systemic VTI:  0.14 m                             Systemic Diam: 2.40 cm Jenkins Rouge MD Electronically signed by Jenkins Rouge MD Signature Date/Time: 02/02/2020/11:33:09 AM    Final    CUP  PACEART REMOTE DEVICE CHECK  Result Date: 01/19/2020 Scheduled remote reviewed. Normal device function.  Next remote 91 days.Sensing Configuration: Primary Gain Setting: 1X Post Shock Pacing: ON    Microbiology: Recent Results (from the past 240 hour(s))  Resp Panel by RT-PCR (Flu A&B, Covid) Nasopharyngeal Swab     Status: None   Collection Time: 02/01/20  7:44 PM   Specimen: Nasopharyngeal Swab; Nasopharyngeal(NP) swabs in vial transport medium  Result Value Ref Range Status   SARS Coronavirus 2 by RT PCR NEGATIVE NEGATIVE Final    Comment: (NOTE) SARS-CoV-2 target nucleic acids are NOT DETECTED.  The SARS-CoV-2 RNA is generally detectable in upper respiratory specimens during the acute phase of infection. The lowest concentration of SARS-CoV-2 viral copies this assay can detect  is 138 copies/mL. A negative result does not preclude SARS-Cov-2 infection and should not be used as the sole basis for treatment or other patient management decisions. A negative result may occur with  improper specimen collection/handling, submission of specimen other than nasopharyngeal swab, presence of viral mutation(s) within the areas targeted by this assay, and inadequate number of viral copies(<138 copies/mL). A negative result must be combined with clinical observations, patient history, and epidemiological information. The expected result is Negative.  Fact Sheet for Patients:  EntrepreneurPulse.com.au  Fact Sheet for Healthcare Providers:  IncredibleEmployment.be  This test is no t yet approved or cleared by the [place] FDA and  has been authorized for detection and/or diagnosis of SARS-CoV-2 by FDA under an Emergency Use Authorization (EUA). This EUA will remain  in effect (meaning this test can be used) for the duration of the COVID-19 declaration under Section 564(b)(1) of the Act, 21 U.S.C.section 360bbb-3(b)(1), unless the authorization is  terminated  or revoked sooner.       Influenza A by PCR NEGATIVE NEGATIVE Final   Influenza B by PCR NEGATIVE NEGATIVE Final    Comment: (NOTE) The Xpert Xpress SARS-CoV-2/FLU/RSV plus assay is intended as an aid in the diagnosis of influenza from Nasopharyngeal swab specimens and should not be used as a sole basis for treatment. Nasal washings and aspirates are unacceptable for Xpert Xpress SARS-CoV-2/FLU/RSV testing.  Fact Sheet for Patients: EntrepreneurPulse.com.au  Fact Sheet for Healthcare Providers: IncredibleEmployment.be  This test is not yet approved or cleared by the [place] FDA and has been authorized for detection and/or diagnosis of SARS-CoV-2 by FDA under an Emergency Use Authorization (EUA). This EUA will remain in effect (meaning this test can be used) for the duration of the COVID-19 declaration under Section 564(b)(1) of the Act, 21 U.S.C. section 360bbb-3(b)(1), unless the authorization is terminated or revoked.  Performed at [facility], [address] [place], [place] [address]      Labs: Basic Metabolic Panel: Recent Labs  Lab 02/03/20 0610 02/04/20 0547 02/05/20 0550 02/06/20 0631 02/07/20 0539  NA 137 138 138 135 134*  K 4.0 3.9 4.2 4.4 4.4  CL 107 107 107 104 102  CO2 _0 GLUCOSE 39* 76 52* 107* 107*  BUN 58* 61* 59* 64* 66*  CREATININE 2.81* 2.85* 2.76* 3.35* 3.22*  CALCIUM 8.1* 7.9* 8.0* 7.9* 8.3*  MG  --   --   --   --  2.5*   Liver Function Tests: Recent Labs  Lab 02/01/20 1848  AST 30  ALT 31  ALKPHOS 105  BILITOT 0.4  PROT 5.6*  ALBUMIN 2.5*   No results for input(s): LIPASE, AMYLASE in the last 168 hours. No results for input(s): AMMONIA in the last 168 hours. CBC: Recent Labs  Lab 02/01/20 1848  WBC 6.6  NEUTROABS 4.5  HGB 10.4*  HCT 33.8*  MCV 83.9  PLT 284   Cardiac Enzymes: No results for input(s): CKTOTAL, CKMB, CKMBINDEX, TROPONINI in the last 168  hours. BNP: Invalid input(s): POCBNP CBG: Recent Labs  Lab 02/06/20 1134 02/06/20 1556 02/06/20 2052 02/07/20 0811 02/07/20 1140  GLUCAP 87 208* 178* 110* 148*    Time coordinating discharge:  36 minutes  Signed:  Orson Eva, DO [facility] Pager: [phone] 02/07/2020, 11:52 AM

## 2020-02-07 NOTE — Care Management Important Message (Signed)
Important Message  Patient Details  Name: Brad Mendoza MRN: [id number] Date of Birth: 06-01-1955   Medicare Important Message Given:  Yes     Tommy Medal 02/07/2020, 2:11 PM

## 2020-02-07 NOTE — TOC Transition Note (Signed)
Transition of Care [facility]) - CM/SW Discharge Note   Patient Details  Name: Brad Mendoza MRN: [id number] Date of Birth: August 02, 1955  [facility] of Care [facility]) CM/SW Contact:  Shade Flood, LCSW Phone Number: 02/07/2020, 10:42 AM   Clinical Narrative:     Pt stable for dc today per MD. Pt does not have any in home needs for dc. PCP hospital follow up appointment scheduled and added to AVS.  TOC signing off.  Final next level of care: Home/Self Care Barriers to Discharge: Barriers Resolved   Patient Goals and CMS Choice Patient states their goals for this hospitalization and ongoing recovery are:: return home      Discharge Placement                       Discharge Plan and Services In-house Referral: Clinical Social Work Discharge Planning Services: Follow-up appt scheduled            DME Arranged: N/A         HH Arranged: NA          Social Determinants of Health (SDOH) Interventions     Readmission Risk Interventions Readmission Risk Prevention Plan 02/07/2020 02/03/2020  Transportation Screening - Complete  PCP or Specialist Appt within 5-7 Days Complete -  Home Care Screening Complete -  Medication Review (RN CM) Complete -  HRI or [place] - Complete  Social Work Consult for [place] Planning/Counseling - Complete  Palliative Care Screening - Not Applicable  Medication Review Press photographer) - Complete  Some recent data might be hidden

## 2020-02-07 NOTE — Plan of Care (Signed)

## 2020-02-13 ENCOUNTER — Ambulatory Visit: Payer: Medicare Other | Admitting: Family

## 2020-02-18 ENCOUNTER — Other Ambulatory Visit: Payer: Self-pay | Admitting: Cardiology

## 2020-02-19 ENCOUNTER — Other Ambulatory Visit: Payer: Medicare Other

## 2020-02-19 DIAGNOSIS — E1129 Type 2 diabetes mellitus with other diabetic kidney complication: Secondary | ICD-10-CM | POA: Diagnosis not present

## 2020-02-19 DIAGNOSIS — D638 Anemia in other chronic diseases classified elsewhere: Secondary | ICD-10-CM | POA: Diagnosis not present

## 2020-02-19 DIAGNOSIS — N189 Chronic kidney disease, unspecified: Secondary | ICD-10-CM | POA: Diagnosis not present

## 2020-02-19 DIAGNOSIS — E1122 Type 2 diabetes mellitus with diabetic chronic kidney disease: Secondary | ICD-10-CM | POA: Diagnosis not present

## 2020-02-19 DIAGNOSIS — R809 Proteinuria, unspecified: Secondary | ICD-10-CM | POA: Diagnosis not present

## 2020-02-21 DIAGNOSIS — I5042 Chronic combined systolic (congestive) and diastolic (congestive) heart failure: Secondary | ICD-10-CM | POA: Diagnosis not present

## 2020-02-21 DIAGNOSIS — E211 Secondary hyperparathyroidism, not elsewhere classified: Secondary | ICD-10-CM | POA: Diagnosis not present

## 2020-02-21 DIAGNOSIS — R809 Proteinuria, unspecified: Secondary | ICD-10-CM | POA: Diagnosis not present

## 2020-02-21 DIAGNOSIS — D638 Anemia in other chronic diseases classified elsewhere: Secondary | ICD-10-CM | POA: Diagnosis not present

## 2020-02-21 DIAGNOSIS — N189 Chronic kidney disease, unspecified: Secondary | ICD-10-CM | POA: Diagnosis not present

## 2020-02-28 ENCOUNTER — Ambulatory Visit: Payer: Medicare Other | Admitting: Student

## 2020-02-28 NOTE — Progress Notes (Deleted)
Cardiology Office Note    Date:  02/28/2020   ID:  Brad Mendoza, DOB Feb 10, 1956, MRN [id number]  PCP:  [name], MD  Cardiologist: Rozann Lesches, MD   EP: Dr. Lovena Le  No chief complaint on file.   History of Present Illness:    Brad Mendoza is a 65 y.o. male w/ PMH of CAD (s/p DES to RCA in 02/2018), chronic combined systolic and diastolic CHF (EF 88-50% by echo in 01/2020 and he is s/p ICD placement in 05/2019), Type 2 DM, HTN, HLD and Stage 4 CKD who presents to the office today for hospital follow-up.   He was most recently admitted to [facility] from 12/11 - 02/07/2020 for an acute CHF exacerbation in the setting of medication compliance. He responded well with IV Lasix as weight declined from 232 lbs at the time of admission to 217 lbs on the day of discharge. He was started on Torsemide 48m BID at discharge and continued on Coreg, Hydralazine and Imdur. Was not on an ACE-I/ARB/ARNI due to his CKD (creatinine was at 3.22 on the day of discharge).     Past Medical History:  Diagnosis Date  . CAD (coronary artery disease)    DES to RCA January 2020  . Chronic back pain   . CKD (chronic [place] disease), stage IV ([place]   . Diverticulosis   . Essential hypertension   . GERD (gastroesophageal reflux disease)   . Hyperlipidemia   . ICD (implantable cardioverter-defibrillator) in place    [facility] 2021  . Onychomycosis   . Polysubstance abuse ([place]   . 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 Type 2 diabetes mellitus ([place]     Past Surgical History:  Procedure Laterality Date  . APPENDECTOMY    . CARDIAC CATHETERIZATION  2005   4 frech cath  . COLONOSCOPY N/A 08/01/2013   Procedure: COLONOSCOPY;  Surgeon: NRogene Houston MD;  [facility];  Service: Endoscopy;  Laterality: N/A;  rescheduled to 1[place]not[place]  . CORONARY STENT INTERVENTION N/A 03/19/2018   Procedure: CORONARY  STENT INTERVENTION;  Surgeon: HLeonie Man MD;  [facility];  Service: Cardiovascular;  Laterality: N/A;  . ESOPHAGOGASTRODUODENOSCOPY N/A 04/23/2015   Procedure: ESOPHAGOGASTRODUODENOSCOPY (EGD);  Surgeon: NRogene Houston MD;  [facility];  Service: Endoscopy;  Laterality: N/A;  1:25  . RIGHT/LEFT HEART CATH AND CORONARY ANGIOGRAPHY N/A 06/03/2016   Procedure: Right/Left Heart Cath and Coronary Angiography;  Surgeon: HBelva Crome MD;  [facility];  Service: Cardiovascular;  Laterality: N/A;  . RIGHT/LEFT HEART CATH AND CORONARY ANGIOGRAPHY N/A 03/19/2018   Procedure: RIGHT/LEFT HEART CATH AND CORONARY ANGIOGRAPHY;  Surgeon: BJolaine Artist MD;  [place];  Service: Cardiovascular;  Laterality: N/A;  . SUBQ ICD IMPLANT N/A 05/27/2019   Procedure: SUBQ ICD IMPLANT;  Surgeon: TEvans Lance MD;  [place];  Service: Cardiovascular;  Laterality: N/A;    Current Medications: Outpatient Medications Prior to Visit  Medication Sig Dispense Refill  . aspirin EC 81 MG tablet Take 81 mg by mouth daily.    . blood glucose meter kit and supplies KIT Dispense based on patient and insurance preference. Test four times daily as directed. (FOR ICD-10 : E11.9 1 each 0  . Blood Glucose Monitoring Suppl (ONETOUCH VERIO) w/Device KIT 1 Device by Does not apply  route 2 (two) times daily. 1 kit 0  . Capsaicin (ICY HOT ARTHRITIS THERAPY EX) Apply 1 application topically 2 (two) times daily as needed (back pain).     . carvedilol (COREG) 6.25 MG tablet Take 1.5 tablets (9.375 mg total) by mouth 2 (two) times daily with a meal. Patient needs an appointment for future refills 90 tablet 0  . Cholecalciferol (VITAMIN D3) 25 MCG (1000 UT) CAPS Take 2,000 Units by mouth daily.    . Continuous Blood Gluc Sensor (FREESTYLE LIBRE 2 SENSOR) MISC Use to test blood sugar up to 6 times daily as directed. E11.9 1 each 10  . fluticasone  (FLONASE) 50 MCG/ACT nasal spray INSTILL 2 SPRAYS INTO EACH NOSTRIL DAILY. (Patient taking differently: Place 2 sprays into both nostrils daily.) 16 g 2  . glucose blood (ONETOUCH VERIO) test strip USE FOUR TIMES DAILY AS NEEDED Dx E11.9 100 each 11  . hydrALAZINE (APRESOLINE) 100 MG tablet TAKE (1) TABLET BY MOUTH (3) TIMES DAILY. (Patient taking differently: Take 100 mg by mouth 3 (three) times daily.) 90 tablet 5  . icosapent Ethyl (VASCEPA) 1 g capsule Take 2 capsules (2 g total) by mouth 2 (two) times daily. 360 capsule 1  . insulin glargine, 2 Unit Dial, (TOUJEO MAX SOLOSTAR) 300 UNIT/ML Solostar Pen Inject 10 Units into the skin at bedtime. 6 mL 0  . isosorbide mononitrate (IMDUR) 120 MG 24 hr tablet TAKE (1) TABLET BY MOUTH ONCE DAILY. (Patient taking differently: Take 120 mg by mouth daily.) 30 tablet 5  . Lancets (ONETOUCH ULTRASOFT) lancets Use as instructed   DX E11.9 100 each 12  . loratadine (CLARITIN) 10 MG tablet Take 1 tablet (10 mg total) by mouth daily. 30 tablet 11  . Multiple Vitamins-Minerals (CENTRUM SILVER ADULT 50+ PO) Take 1 tablet by mouth daily.    . nitroGLYCERIN (NITROSTAT) 0.4 MG SL tablet DISSOLVE 1 TAB UNDER TOUNGE FOR CHEST PAIN. MAY REPEAT EVERY 5 MINUTES FOR 3 DOSES. IF NO RELIEF CALL 911 OR GO TO ER 25 tablet 2  . Omega-3 Fatty Acids (FISH OIL) 1000 MG CPDR Take 1,000 mg by mouth daily.     . pantoprazole (PROTONIX) 40 MG tablet Take 1 tablet (40 mg total) by mouth daily. 60 tablet 5  . pregabalin (LYRICA) 200 MG capsule Take 2 capsules (400 mg total) by mouth at bedtime. 60 capsule 2  . PROAIR HFA 108 (90 Base) MCG/ACT inhaler USE 2 PUFFS EVERY 6 HOURS AS NEEDED 8.5 g 3  . ranolazine (RANEXA) 500 MG 12 hr tablet TAKE 1 TABLET BY MOUTH TWICE DAILY. 180 tablet 3  . rosuvastatin (CRESTOR) 10 MG tablet TAKE 1 TABLET BY MOUTH ONCE A DAY. 30 tablet 5  . Semaglutide, 1 MG/DOSE, (OZEMPIC, 1 MG/DOSE,) 2 MG/1.5ML SOPN Inject 1 mg into the skin once a week. 3 mL 2  .  terbinafine (LAMISIL) 1 % cream Apply 1 application topically 2 (two) times daily. Apply to both feet and between toes 30 g 1  . tiZANidine (ZANAFLEX) 4 MG tablet Take 1 tablet (4 mg total) by mouth every 6 (six) hours as needed for muscle spasms. 60 tablet 5  . torsemide (DEMADEX) 20 MG tablet Take 3 tablets (60 mg total) by mouth 2 (two) times daily. 180 tablet 1  . Vitamin D, Ergocalciferol, (DRISDOL) 1.25 MG (50000 UNIT) CAPS capsule Take 1 capsule (50,000 Units total) by mouth every 7 (seven) days. (Patient taking differently: Take 50,000 Units by mouth daily.) 13 capsule 3  .  XARELTO 2.5 MG TABS tablet TAKE (1) TABLET BY MOUTH TWICE DAILY. (Patient taking differently: Take 2.5 mg by mouth 2 (two) times daily.) 60 tablet 11   No facility-administered medications prior to visit.     Allergies:   Sulfa antibiotics and Penicillins   Social History   Socioeconomic History  . Marital status: Legally Separated    Spouse name: Not on file  . Number of children: 2  . Years of education: 4  . Highest education level: 4th grade  Occupational History  . Occupation: disabled  Tobacco Use  . Smoking status: Never Smoker  . Smokeless tobacco: Current User    Types: Snuff  . Tobacco comment: dips snuff about 6 cans/week for 3 years  Vaping Use  . Vaping Use: Never used  Substance and Sexual Activity  . Alcohol use: Yes    Alcohol/week: 1.0 standard drink    Types: 1 Cans of beer per week    Comment: occ - has cut down, now once every 2-3 months  . Drug use: Yes    Types: Marijuana    Comment: occasional use - smoking less  . Sexual activity: Not on file  Other Topics Concern  . Not on file  Social History Narrative   Lives in [place] with spouse   Disabled.   Social Determinants of Health   Financial Resource Strain: Not on file  Food Insecurity: Not on file  Transportation Needs: Unmet Transportation Needs  . Lack of Transportation (Medical): Yes  . Lack of Transportation  (Non-Medical): No  Physical Activity: Not on file  Stress: Not on file  Social Connections: Unknown  . Frequency of Communication with Friends and Family: More than three times a week  . Frequency of Social Gatherings with Friends and Family: More than three times a week  . Attends Religious Services: 1 to 4 times per year  . Active Member of Clubs or Organizations: No  . Attends Archivist Meetings: Never  . Marital Status: Patient refused     Family History:  The patient's ***family history includes Alcohol abuse in his brother; Diabetes in his father; Hypertension in his father; Lung cancer in his mother.   Review of Systems:   Please see the history of present illness.     General:  No chills, fever, night sweats or weight changes.  Cardiovascular:  No chest pain, dyspnea on exertion, edema, orthopnea, palpitations, paroxysmal nocturnal dyspnea. Dermatological: No rash, lesions/masses Respiratory: No cough, dyspnea Urologic: No hematuria, dysuria Abdominal:   No nausea, vomiting, diarrhea, bright red blood per rectum, melena, or hematemesis Neurologic:  No visual changes, wkns, changes in mental status. All other systems reviewed and are otherwise negative except as noted above.   Physical Exam:    VS:  There were no vitals taken for this visit.   General: Well developed, well nourished,male appearing in no acute distress. Head: Normocephalic, atraumatic. Neck: No carotid bruits. JVD not elevated.  Lungs: Respirations regular and unlabored, without wheezes or rales.  Heart: ***Regular rate and rhythm. No S3 or S4.  No murmur, no rubs, or gallops appreciated. Abdomen: Appears non-distended. No obvious abdominal masses. Msk:  Strength and tone appear normal for age. No obvious joint deformities or effusions. Extremities: No clubbing or cyanosis. No edema.  Distal pedal pulses are 2+ bilaterally. Neuro: Alert and oriented X 3. Moves all extremities spontaneously. No  focal deficits noted. Psych:  Responds to questions appropriately with a normal affect. Skin: No rashes or  lesions noted  Wt Readings from Last 3 Encounters:  02/06/20 217 lb 9.5 oz (98.7 kg)  01/21/20 228 lb (103.4 kg)  12/09/19 215 lb (97.5 kg)        Studies/Labs Reviewed:   EKG:  EKG is*** ordered today.  The ekg ordered today demonstrates ***  Recent Labs: 04/02/2019: TSH 1.969 02/01/2020: ALT 31; Hemoglobin 10.4; Platelets 284 02/06/2020: B Natriuretic Peptide 194.0 02/07/2020: BUN 66; Creatinine, Ser 3.22; Magnesium 2.5; Potassium 4.4; Sodium 134   Lipid Panel    Component Value Date/Time   CHOL 173 12/09/2019 1348   CHOL 277 (H) 07/11/2012 1308   TRIG 113 12/09/2019 1348   TRIG 658 (HH) 12/16/2013 1459   TRIG 522 (H) 07/11/2012 1308   HDL 54 12/09/2019 1348   HDL 42 12/16/2013 1459   HDL 39 (L) 07/11/2012 1308   CHOLHDL 3.2 12/09/2019 1348   CHOLHDL 8.2 01/22/2019 1600   VLDL UNABLE TO CALCULATE IF TRIGLYCERIDE OVER 400 mg/dL 01/22/2019 1600   LDLCALC 99 12/09/2019 1348   LDLCALC 144 (H) 05/10/2013 1225   LDLCALC 134 (H) 07/11/2012 1308   LDLDIRECT 135.2 (H) 01/22/2019 1600    Additional studies/ records that were reviewed today include:   Cardiac Catheterization: 02/2018  Ost 1st Diag lesion is 95% stenosed.  Prox LAD to Mid LAD lesion is 60% stenosed.  2nd Diag lesion is 70% stenosed.  Mid Cx to Dist Cx lesion is 30% stenosed.  Mid RCA lesion is 70% stenosed.  Prox RCA lesion is 90% stenosed.   Findings:   Ao = 153/81 (111) LV = 144/30 RA = 11 RV = 77/12 PA = 76/30 (50) PCW = 29 Fick cardiac output/index = 6.8/3.1 PVR = 3.0 WU Ao sat = 95% PA sat = 62%, 65%  Assessment: 1. 2v CAD with high-grade lesion in proximal RCA and first diagonal 2. The RCA lesion has progressed from previous.  3. There is stable moderate CAD in LAD 4. EF 30-35% by echo with elevated filling pressures 5. Moderate mixed pulmonary HTN    Plan/Discussion:  Films reviewed with Dr. Wilhemina Cash. Plan PCI of RCA. Admit for diuresis. Will need CPAP.     Coronary Stent Intervention: 02/2018  Prox RCA lesion is 90% stenosed.  A drug-eluting stent was successfully placed using a STENT SYNERGY DES 2.5X16. Postdilated to 2.8 mm  Post intervention, there is a 0% residual stenosis.  Mid RCA lesion is 70% stenosed -stable from prior cath. Will be treated medically.   SUMMARY   Successful DES PCI to proximal RCA lesion with a Synergy DES 2.5 mg 16 mm postdilated to 2.8 mm.  RECOMMENDATIONS  Monitor overnight and potentially 2 to 3days to allow for diuresis with EDP of 30.  We will gently run IV fluids and give IV Lasix 80 mg at 1500.  Reevaluate in the morning.    Echocardiogram: 01/2020 IMPRESSIONS    1. Left ventricular ejection fraction, by estimation, is 30 to 35%. The  left ventricle has moderately decreased function. The left ventricle  demonstrates global hypokinesis. The left ventricular internal cavity size  was mildly dilated. There is mild  left ventricular hypertrophy. Left ventricular diastolic parameters are  consistent with Grade II diastolic dysfunction (pseudonormalization).  Elevated left ventricular end-diastolic pressure.  2. Right ventricular systolic function is normal. The right ventricular  size is normal. There is moderately elevated pulmonary artery systolic  pressure.  3. Left atrial size was moderately dilated.  4. The mitral valve is normal in structure.  Trivial mitral valve  regurgitation. No evidence of mitral stenosis.  5. Tricuspid valve regurgitation is moderate.  6. The aortic valve is tricuspid. Aortic valve regurgitation is not  visualized. No aortic stenosis is present.  7. The inferior vena cava is normal in size with greater than 50%  respiratory variability, suggesting right atrial pressure of 3 mmHg.   Assessment:    No diagnosis found.   Plan:   In  order of problems listed above:  1. ***    Shared Decision Making/Informed Consent:   {Are you ordering a CV Procedure (e.g. stress test, cath, DCCV, TEE, etc)?   Press F2        :[id number]}    Medication Adjustments/Labs and Tests Ordered: Current medicines are reviewed at length with the patient today.  Concerns regarding medicines are outlined above.  Medication changes, Labs and Tests ordered today are listed in the Patient Instructions below. There are no Patient Instructions on file for this visit.   Signed, Erma Heritage, PA-C  02/28/2020 7:48 AM    [place] [address] [address] [place], [place] [address] Phone: [phone] Fax: [phone]

## 2020-03-10 ENCOUNTER — Ambulatory Visit: Payer: Medicare Other | Admitting: Family Medicine

## 2020-03-18 ENCOUNTER — Other Ambulatory Visit: Payer: Medicare Other

## 2020-03-18 ENCOUNTER — Ambulatory Visit: Payer: Medicare Other | Admitting: Family Medicine

## 2020-03-18 ENCOUNTER — Other Ambulatory Visit: Payer: Self-pay

## 2020-03-18 DIAGNOSIS — E1122 Type 2 diabetes mellitus with diabetic chronic kidney disease: Secondary | ICD-10-CM | POA: Diagnosis not present

## 2020-03-18 DIAGNOSIS — R809 Proteinuria, unspecified: Secondary | ICD-10-CM | POA: Diagnosis not present

## 2020-03-18 DIAGNOSIS — N189 Chronic kidney disease, unspecified: Secondary | ICD-10-CM | POA: Diagnosis not present

## 2020-03-18 DIAGNOSIS — E211 Secondary hyperparathyroidism, not elsewhere classified: Secondary | ICD-10-CM | POA: Diagnosis not present

## 2020-03-18 DIAGNOSIS — D638 Anemia in other chronic diseases classified elsewhere: Secondary | ICD-10-CM | POA: Diagnosis not present

## 2020-03-19 DIAGNOSIS — I5043 Acute on chronic combined systolic (congestive) and diastolic (congestive) heart failure: Secondary | ICD-10-CM | POA: Diagnosis not present

## 2020-03-19 DIAGNOSIS — E211 Secondary hyperparathyroidism, not elsewhere classified: Secondary | ICD-10-CM | POA: Diagnosis not present

## 2020-03-19 DIAGNOSIS — E1129 Type 2 diabetes mellitus with other diabetic kidney complication: Secondary | ICD-10-CM | POA: Diagnosis not present

## 2020-03-19 DIAGNOSIS — N189 Chronic kidney disease, unspecified: Secondary | ICD-10-CM | POA: Diagnosis not present

## 2020-03-19 DIAGNOSIS — E1122 Type 2 diabetes mellitus with diabetic chronic kidney disease: Secondary | ICD-10-CM | POA: Diagnosis not present

## 2020-03-19 DIAGNOSIS — R809 Proteinuria, unspecified: Secondary | ICD-10-CM | POA: Diagnosis not present

## 2020-03-19 DIAGNOSIS — D638 Anemia in other chronic diseases classified elsewhere: Secondary | ICD-10-CM | POA: Diagnosis not present

## 2020-03-20 ENCOUNTER — Encounter: Payer: Self-pay | Admitting: Family Medicine

## 2020-03-23 ENCOUNTER — Other Ambulatory Visit: Payer: Self-pay | Admitting: Cardiology

## 2020-03-24 NOTE — Telephone Encounter (Signed)
Message to pharmacy-pt in need of office visit Pt aware needs f/u appt made

## 2020-03-25 ENCOUNTER — Ambulatory Visit: Payer: Medicare Other | Admitting: Family Medicine

## 2020-04-13 DIAGNOSIS — E1122 Type 2 diabetes mellitus with diabetic chronic kidney disease: Secondary | ICD-10-CM | POA: Diagnosis not present

## 2020-04-13 DIAGNOSIS — N189 Chronic kidney disease, unspecified: Secondary | ICD-10-CM | POA: Diagnosis not present

## 2020-04-13 DIAGNOSIS — E1129 Type 2 diabetes mellitus with other diabetic kidney complication: Secondary | ICD-10-CM | POA: Diagnosis not present

## 2020-04-13 DIAGNOSIS — I129 Hypertensive chronic kidney disease with stage 1 through stage 4 chronic kidney disease, or unspecified chronic kidney disease: Secondary | ICD-10-CM | POA: Diagnosis not present

## 2020-04-13 DIAGNOSIS — I5043 Acute on chronic combined systolic (congestive) and diastolic (congestive) heart failure: Secondary | ICD-10-CM | POA: Diagnosis not present

## 2020-04-13 DIAGNOSIS — D638 Anemia in other chronic diseases classified elsewhere: Secondary | ICD-10-CM | POA: Diagnosis not present

## 2020-04-13 DIAGNOSIS — R809 Proteinuria, unspecified: Secondary | ICD-10-CM | POA: Diagnosis not present

## 2020-04-14 ENCOUNTER — Encounter (HOSPITAL_COMMUNITY): Payer: Medicare Other

## 2020-04-16 ENCOUNTER — Encounter (HOSPITAL_COMMUNITY): Payer: Medicare Other

## 2020-04-16 NOTE — Progress Notes (Incomplete)
PCP: Pcp, No HF Cardiology: Dr. Aundra Dubin  65 y.o. with history of HTN, DM, CKD stage 3-4, mixed ischemic/nonischemic cardiomyopathy, and CAD presents for followup of CHF.  He has had a history of cardiomyopathy since at least 2014.  In 1/20, he had RHC/LHC with marked volume overload.  He was diuresed with IV Lasix.  He also had DES to proximal RCA and has been on ticagrelor and ASA 81 since then.  He has had poorly controlled diabetes.  Creatinine had been around 1.8-1.9 baseline, but in 10/20 had increased up to 3.36.  Echo was done and reviewed, EF 30-35% with normal RV size and systolic function.   Main complaint is fatigue.  He falls asleep easily when sitting.  He denies significant exertional dyspnea.  He has rare central atypical chest tightness, not related to exertion.  He has been off Entresto and spironolactone due to elevated creatinine. He is followed by nephrology (Dr. Lowanda Foster).   Today she returns for HF follow up, has not been seen in AHF clinic since 12/20. Last visit carvedilol was increased, sleep study arranged, and patient referred to EP as EF 30-35%. He is s/p S-ICD placement (4/21).  Admission 12/21 for A/CHF in the setting of dietary noncompliance. He was diuresed with  IV lasix, down 15 lbs. Hospitalization c/b AKI on CKD IV. Discharge weight 217 lbs.  Today, overall feeling fine. Denies increasing SOB, CP, dizziness, edema, or PND/Orthopnea. Appetite ok. No fever or chills. Weight at home 170 pounds. Taking all medications.   ECG (personally reviewed):   Labs (10/20): K 4, creatinine 3.36, BNP 105 Labs (10/21): HDL 54, LDL 99 Labs (1/22): K 4.4, creatinine 3.22, a1c 8.1, hgb 10.4  PMH: 1. Chronic systolic CHF: Primarily nonischemic cardiomyopathy.   - RHC (1/20): mean RA 11, PA 76/30, mean PCWP 29, CI 3.1 - Echo (12/20): EF 30-35%, diffuse hypokinesis, mild LVH, normal RV size and systolic function.  - Echo (12/21): EF 30-35%, diffuse hypokinesis, Grade II DD,  moderate TR. - [employer] S-ICD (4/21) 2. CAD: Last LHC in 1/20 with 95% D1, 60% mLAD, 70% D2, 90% proximal and 70% mid RCA => DES to proximal RCA.  3. HTN 4. Type II diabetes 5. CKD: stage III-IV.  6. History of ETOH abuse.   Social History   Socioeconomic History  . Marital status: Legally Separated    Spouse name: Not on file  . Number of children: 2  . Years of education: 4  . Highest education level: 4th grade  Occupational History  . Occupation: disabled  Tobacco Use  . Smoking status: Never Smoker  . Smokeless tobacco: Current User    Types: Snuff  . Tobacco comment: dips snuff about 6 cans/week for 3 years  Vaping Use  . Vaping Use: Never used  Substance and Sexual Activity  . Alcohol use: Yes    Alcohol/week: 1.0 standard drink    Types: 1 Cans of beer per week    Comment: occ - has cut down, now once every 2-3 months  . Drug use: Yes    Types: Marijuana    Comment: occasional use - smoking less  . Sexual activity: Not on file  Other Topics Concern  . Not on file  Social History Narrative   Lives in [place] with spouse   Disabled.   Social Determinants of Health   Financial Resource Strain: Not on file  Food Insecurity: Not on file  Transportation Needs: Unmet Transportation Needs  . Lack of Transportation (Medical):  Yes  . Lack of Transportation (Non-Medical): No  Physical Activity: Not on file  Stress: Not on file  Social Connections: Not on file  Intimate Partner Violence: Not on file   Family History  Problem Relation Age of Onset  . Hypertension Father   . Diabetes Father   . Lung cancer Mother   . Alcohol abuse Brother   . Colon cancer Neg Hx    ROS: All systems reviewed and negative except as per HPI.   Current Outpatient Medications  Medication Sig Dispense Refill  . aspirin EC 81 MG tablet Take 81 mg by mouth daily.    . blood glucose meter kit and supplies KIT Dispense based on patient and insurance preference. Test four times daily as  directed. (FOR ICD-10 : E11.9 1 each 0  . Blood Glucose Monitoring Suppl (ONETOUCH VERIO) w/Device KIT 1 Device by Does not apply route 2 (two) times daily. 1 kit 0  . Capsaicin (ICY HOT ARTHRITIS THERAPY EX) Apply 1 application topically 2 (two) times daily as needed (back pain).     . carvedilol (COREG) 6.25 MG tablet Take 1.5 tablets (9.375 mg total) by mouth 2 (two) times daily with a meal. Last refill without office visit Please call [phone] 90 tablet 0  . Cholecalciferol (VITAMIN D3) 25 MCG (1000 UT) CAPS Take 2,000 Units by mouth daily.    . Continuous Blood Gluc Sensor (FREESTYLE LIBRE 2 SENSOR) MISC Use to test blood sugar up to 6 times daily as directed. E11.9 1 each 10  . fluticasone (FLONASE) 50 MCG/ACT nasal spray INSTILL 2 SPRAYS INTO EACH NOSTRIL DAILY. (Patient taking differently: Place 2 sprays into both nostrils daily.) 16 g 2  . glucose blood (ONETOUCH VERIO) test strip USE FOUR TIMES DAILY AS NEEDED Dx E11.9 100 each 11  . hydrALAZINE (APRESOLINE) 100 MG tablet TAKE (1) TABLET BY MOUTH (3) TIMES DAILY. (Patient taking differently: Take 100 mg by mouth 3 (three) times daily.) 90 tablet 5  . icosapent Ethyl (VASCEPA) 1 g capsule Take 2 capsules (2 g total) by mouth 2 (two) times daily. 360 capsule 1  . insulin glargine, 2 Unit Dial, (TOUJEO MAX SOLOSTAR) 300 UNIT/ML Solostar Pen Inject 10 Units into the skin at bedtime. 6 mL 0  . isosorbide mononitrate (IMDUR) 120 MG 24 hr tablet TAKE (1) TABLET BY MOUTH ONCE DAILY. (Patient taking differently: Take 120 mg by mouth daily.) 30 tablet 5  . Lancets (ONETOUCH ULTRASOFT) lancets Use as instructed   DX E11.9 100 each 12  . loratadine (CLARITIN) 10 MG tablet Take 1 tablet (10 mg total) by mouth daily. 30 tablet 11  . Multiple Vitamins-Minerals (CENTRUM SILVER ADULT 50+ PO) Take 1 tablet by mouth daily.    . nitroGLYCERIN (NITROSTAT) 0.4 MG SL tablet DISSOLVE 1 TAB UNDER TOUNGE FOR CHEST PAIN. MAY REPEAT EVERY 5 MINUTES FOR 3 DOSES. IF  NO RELIEF CALL 911 OR GO TO ER 25 tablet 2  . Omega-3 Fatty Acids (FISH OIL) 1000 MG CPDR Take 1,000 mg by mouth daily.     . pantoprazole (PROTONIX) 40 MG tablet Take 1 tablet (40 mg total) by mouth daily. 60 tablet 5  . pregabalin (LYRICA) 200 MG capsule Take 2 capsules (400 mg total) by mouth at bedtime. 60 capsule 2  . PROAIR HFA 108 (90 Base) MCG/ACT inhaler USE 2 PUFFS EVERY 6 HOURS AS NEEDED 8.5 g 3  . ranolazine (RANEXA) 500 MG 12 hr tablet TAKE 1 TABLET BY MOUTH TWICE DAILY.  180 tablet 3  . rosuvastatin (CRESTOR) 10 MG tablet TAKE 1 TABLET BY MOUTH ONCE A DAY. 30 tablet 5  . Semaglutide, 1 MG/DOSE, (OZEMPIC, 1 MG/DOSE,) 2 MG/1.5ML SOPN Inject 1 mg into the skin once a week. 3 mL 2  . terbinafine (LAMISIL) 1 % cream Apply 1 application topically 2 (two) times daily. Apply to both feet and between toes 30 g 1  . tiZANidine (ZANAFLEX) 4 MG tablet Take 1 tablet (4 mg total) by mouth every 6 (six) hours as needed for muscle spasms. 60 tablet 5  . torsemide (DEMADEX) 20 MG tablet Take 3 tablets (60 mg total) by mouth 2 (two) times daily. 180 tablet 1  . Vitamin D, Ergocalciferol, (DRISDOL) 1.25 MG (50000 UNIT) CAPS capsule Take 1 capsule (50,000 Units total) by mouth every 7 (seven) days. (Patient taking differently: Take 50,000 Units by mouth daily.) 13 capsule 3  . XARELTO 2.5 MG TABS tablet TAKE (1) TABLET BY MOUTH TWICE DAILY. (Patient taking differently: Take 2.5 mg by mouth 2 (two) times daily.) 60 tablet 11   No current facility-administered medications for this visit.   There were no vitals taken for this visit. General:  NAD. No resp difficulty HEENT: Normal Neck: Supple. No JVD. Carotids 2+ bilat; no bruits. No lymphadenopathy or thryomegaly appreciated. Cor: PMI nondisplaced. Regular rate & rhythm. No rubs, gallops or murmurs. Lungs: Clear Abdomen: Soft, nontender, nondistended. No hepatosplenomegaly. No bruits or masses. Good bowel sounds. Extremities: No cyanosis, clubbing,  rash, edema Neuro: alert & oriented x 3, cranial nerves grossly intact. Moves all 4 extremities w/o difficulty. Affect pleasant.  1. Chronic systolic CHF: Mixed nonischemic/ischemic cardiomyopathy.  Echo (10/20), EF 30-35% with diffuse hypokinesis.  He is not volume overloaded on exam.  NYHA class II symptoms.  - Continue Coreg 9.375 mg bid.  - Continue hydralazine 100 mg tid and Imdur 120 mg daily.  - Continue torsemide 40 mg daily.   - He has been taking metolazone 5 mg on Monday and Friday.  I will check BMET today, if creatinine remains significantly above baseline, I will cut back on metolazone.  - He has had depressed EF for a prolonged period.  I will refer him to EP for ICD (he would not be a CRT candidate with narrow QRS).  - Hold off on Entresto and spironolactone for now with elevated creatinine.  2. CKD: Stage 3-4.  Creatinine up to 3.36 recently from prior baseline 1.8-1.9. He is not volume overloaded on exam.  If creatinine today remains significantly elevated on labs today, would favor cutting back on metolazone.  3. CAD: S/p DES to proximal RCA in 1/20.  He has rare atypical chest pain.  - Continue ranolazine, QTc acceptable.  - Continue ASA 81 and ticagrelor.  Ticagrelor can be stopped in 1/21.  At that time, would start him on rivaroxaban 2.5 mg bid ([facility] protocol).  - Continue statin, check lipids today.  4. OSA: Fatigue and daytime sleepiness.  - I will arrange for a home sleep study.   Followup in 2 months.   Maricela Bo [place]-BC 04/16/2020

## 2020-04-17 ENCOUNTER — Other Ambulatory Visit: Payer: Self-pay

## 2020-04-17 ENCOUNTER — Emergency Department (HOSPITAL_COMMUNITY): Payer: Medicare Other

## 2020-04-17 ENCOUNTER — Emergency Department (HOSPITAL_COMMUNITY)
Admission: EM | Admit: 2020-04-17 | Discharge: 2020-04-17 | Disposition: A | Discharge status: Home / Self Care | Payer: Medicare Other | Attending: Emergency Medicine | Admitting: Emergency Medicine

## 2020-04-17 ENCOUNTER — Encounter (HOSPITAL_COMMUNITY): Payer: Self-pay

## 2020-04-17 DIAGNOSIS — W19XXXA Unspecified fall, initial encounter: Secondary | ICD-10-CM | POA: Diagnosis not present

## 2020-04-17 DIAGNOSIS — Z23 Encounter for immunization: Secondary | ICD-10-CM | POA: Diagnosis not present

## 2020-04-17 DIAGNOSIS — W109XXA Fall (on) (from) unspecified stairs and steps, initial encounter: Secondary | ICD-10-CM | POA: Insufficient documentation

## 2020-04-17 DIAGNOSIS — Z7982 Long term (current) use of aspirin: Secondary | ICD-10-CM | POA: Diagnosis not present

## 2020-04-17 DIAGNOSIS — M25579 Pain in unspecified ankle and joints of unspecified foot: Secondary | ICD-10-CM | POA: Diagnosis not present

## 2020-04-17 DIAGNOSIS — E1122 Type 2 diabetes mellitus with diabetic chronic kidney disease: Secondary | ICD-10-CM | POA: Diagnosis not present

## 2020-04-17 DIAGNOSIS — I5042 Chronic combined systolic (congestive) and diastolic (congestive) heart failure: Secondary | ICD-10-CM | POA: Insufficient documentation

## 2020-04-17 DIAGNOSIS — S99911A Unspecified injury of right ankle, initial encounter: Secondary | ICD-10-CM | POA: Diagnosis present

## 2020-04-17 DIAGNOSIS — Z743 Need for continuous supervision: Secondary | ICD-10-CM | POA: Diagnosis not present

## 2020-04-17 DIAGNOSIS — S8251XA Displaced fracture of medial malleolus of right tibia, initial encounter for closed fracture: Secondary | ICD-10-CM | POA: Diagnosis not present

## 2020-04-17 DIAGNOSIS — I2510 Atherosclerotic heart disease of native coronary artery without angina pectoris: Secondary | ICD-10-CM | POA: Diagnosis not present

## 2020-04-17 DIAGNOSIS — Z794 Long term (current) use of insulin: Secondary | ICD-10-CM | POA: Insufficient documentation

## 2020-04-17 DIAGNOSIS — Z79899 Other long term (current) drug therapy: Secondary | ICD-10-CM | POA: Insufficient documentation

## 2020-04-17 DIAGNOSIS — S82831A Other fracture of upper and lower end of right fibula, initial encounter for closed fracture: Secondary | ICD-10-CM | POA: Diagnosis not present

## 2020-04-17 DIAGNOSIS — I130 Hypertensive heart and chronic kidney disease with heart failure and stage 1 through stage 4 chronic kidney disease, or unspecified chronic kidney disease: Secondary | ICD-10-CM | POA: Diagnosis not present

## 2020-04-17 DIAGNOSIS — S82851A Displaced trimalleolar fracture of right lower leg, initial encounter for closed fracture: Secondary | ICD-10-CM | POA: Insufficient documentation

## 2020-04-17 DIAGNOSIS — Z7901 Long term (current) use of anticoagulants: Secondary | ICD-10-CM | POA: Diagnosis not present

## 2020-04-17 DIAGNOSIS — N184 Chronic kidney disease, stage 4 (severe): Secondary | ICD-10-CM | POA: Diagnosis not present

## 2020-04-17 DIAGNOSIS — M7989 Other specified soft tissue disorders: Secondary | ICD-10-CM | POA: Diagnosis not present

## 2020-04-17 MED ORDER — TETANUS-DIPHTH-ACELL PERTUSSIS 5-2.5-18.5 LF-MCG/0.5 IM SUSY
0.5000 mL | PREFILLED_SYRINGE | Freq: Once | INTRAMUSCULAR | Status: AC
  Administered 2020-04-17: 0.5 mL via INTRAMUSCULAR
  Filled 2020-04-17: qty 0.5

## 2020-04-17 MED ORDER — HYDROMORPHONE HCL 1 MG/ML IJ SOLN
0.5000 mg | Freq: Once | INTRAMUSCULAR | Status: AC
  Administered 2020-04-17: 0.5 mg via INTRAVENOUS
  Filled 2020-04-17: qty 1

## 2020-04-17 MED ORDER — OXYCODONE-ACETAMINOPHEN 5-325 MG PO TABS: 1.0000 | ORAL_TABLET | ORAL | 0 refills | Status: AC | PRN

## 2020-04-17 NOTE — ED Triage Notes (Addendum)
Pt brought in by EMS. Tripped on steps and injured right ankle. Pt said he fell on his knees and then fell overObvious deformity noted . Right leg stabilized by EMS

## 2020-04-17 NOTE — ED Provider Notes (Signed)
[facility] EMERGENCY DEPARTMENT Provider Note   CSN: [id number] Arrival date & time: 04/17/20  1652     History Chief Complaint  Patient presents with  . Ankle Pain    Brad Mendoza is a 65 y.o. male.  The history is provided by the patient and medical records.  Ankle Pain Location:  Ankle Time since incident:  1 hour Injury: yes   Mechanism of injury: fall   Fall:    Fall occurred:  Down stairs   Height of fall:  1 foo   Impact surface:  Product manager of impact:  Knees   Entrapped after fall: no   Ankle location:  R ankle Pain details:    Quality:  Aching and throbbing   Radiates to:  Does not radiate   Severity:  Severe   Onset quality:  Sudden   Timing:  Constant   Progression:  Improving Chronicity:  New Tetanus status:  Out of date Prior injury to area:  No Relieved by:  Immobilization Worsened by:  Bearing weight Associated symptoms: decreased ROM, numbness and swelling   Associated symptoms: no back pain        Past Medical History:  Diagnosis Date  . CAD (coronary artery disease)    DES to RCA January 2020  . Chronic back pain   . CKD (chronic kidney disease), stage IV ([place])   . Diverticulosis   . Essential hypertension   . GERD (gastroesophageal reflux disease)   . Hyperlipidemia   . ICD (implantable cardioverter-defibrillator) in place    [facility] - Dr. Lovena Le, implanted April 2021  . Onychomycosis   . Polysubstance abuse ([place])   . Secondary cardiomyopathy ([place])    Mixed ischemic and nonischemic  . Type 2 diabetes mellitus [facility])     Patient Active Problem List   Diagnosis Date Noted  . Acute renal failure superimposed on stage 4 chronic kidney disease ([place]) 02/07/2020  . Acute on chronic systolic CHF (congestive heart failure) ([place]) 02/01/2020  . Lumbar radiculopathy 03/22/2019  . Imbalance 03/22/2019  . Chronic kidney disease, stage IV (severe) ([place]) 02/11/2019  . Acute on chronic systolic (congestive) heart failure  ([place]) 03/19/2018  . 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 Onychomycosis 05/23/2017  . Acid reflux 05/23/2017  . Cardiomyopathy ([place]) 05/23/2017  . Anginal pain ([place]) 06/01/2016  . Angina pectoris ([place]) 06/01/2016  . CHF exacerbation ([place]) 11/17/2015  . Congestive heart failure ([place]) 11/17/2015  . Chronic combined systolic and diastolic heart failure ([place]) 11/16/2015  . Type 2 diabetes mellitus with hyperosmolar nonketotic hyperglycemia ([place]) 05/08/2015  . Hyponatremia 05/08/2015  . Hypochloremia 05/08/2015  . Type 2 diabetes mellitus with other specified complication ([place]) 61/68/3729  . Type 2 diabetes mellitus with diabetic polyneuropathy ([place]) 12/16/2013  . Type 2 diabetes mellitus ([place]) 12/16/2013  . Coronary artery disease involving native coronary artery of native heart with angina pectoris ([place]) 09/18/2013  . Coronary arteriosclerosis 09/18/2013  . Heme positive stool 06/12/2013  . Occult blood in stools 06/12/2013  . Seasonal allergic rhinitis 07/11/2012  . Polysubstance abuse ([place]) 12/24/2010  . Nonischemic cardiomyopathy ([place])   . Diabetic retinopathy of both eyes associated with type 2 diabetes mellitus ([place])   . Hyperlipemia 11/16/2009  . Obesity 11/16/2009  . Tobacco dependence 11/16/2009  . Essential hypertension 11/16/2009  . Hyperlipidemia 11/16/2009  . Tobacco dependence syndrome 11/16/2009    Past Surgical History:  Procedure Laterality Date  . APPENDECTOMY    . CARDIAC CATHETERIZATION  2005  4 frech cath  . COLONOSCOPY N/A 08/01/2013   Procedure: COLONOSCOPY;  Surgeon: Rogene Houston, MD;  Location[facility];  Service: Endoscopy;  Laterality: N/A;  rescheduled to [place] notified pt  . CORONARY STENT INTERVENTION N/A 03/19/2018   Procedure: CORONARY STENT INTERVENTION;  Surgeon: Leonie Man, MD;  Location: [place] CV LAB;  Service: Cardiovascular;  Laterality: N/A;  . ESOPHAGOGASTRODUODENOSCOPY N/A 04/23/2015   Procedure: ESOPHAGOGASTRODUODENOSCOPY (EGD);   Surgeon: Rogene Houston, MD;  Location: [facility];  Service: Endoscopy;  Laterality: N/A;  1:25  . RIGHT/LEFT HEART CATH AND CORONARY ANGIOGRAPHY N/A 06/03/2016   Procedure: Right/Left Heart Cath and Coronary Angiography;  Surgeon: Belva Crome, MD;  Location: [place] CV LAB;  Service: Cardiovascular;  Laterality: N/A;  . RIGHT/LEFT HEART CATH AND CORONARY ANGIOGRAPHY N/A 03/19/2018   Procedure: RIGHT/LEFT HEART CATH AND CORONARY ANGIOGRAPHY;  Surgeon: Jolaine Artist, MD;  Location: [place] CV LAB;  Service: Cardiovascular;  Laterality: N/A;  . SUBQ ICD IMPLANT N/A 05/27/2019   Procedure: SUBQ ICD IMPLANT;  Surgeon: Evans Lance, MD;  Location: [place] CV LAB;  Service: Cardiovascular;  Laterality: N/A;       Family History  Problem Relation Age of Onset  . Hypertension Father   . Diabetes Father   . Lung cancer Mother   . Alcohol abuse Brother   . Colon cancer Neg Hx     Social History   Tobacco Use  . Smoking status: Never Smoker  . Smokeless tobacco: Current User    Types: Snuff  . Tobacco comment: dips snuff about 6 cans/week for 3 years  Vaping Use  . Vaping Use: Never used  Substance Use Topics  . Alcohol use: Yes    Alcohol/week: 1.0 standard drink    Types: 1 Cans of beer per week    Comment: occ - has cut down, now once every 2-3 months  . Drug use: Yes    Types: Marijuana    Comment: occasional use - smoking less    Home Medications Prior to Admission medications   Medication Sig Start Date End Date Taking? Authorizing Provider  aspirin EC 81 MG tablet Take 81 mg by mouth daily.    [provider]  blood glucose meter kit and supplies KIT Dispense based on patient and insurance preference. Test four times daily as directed. (FOR ICD-10 : E11.9 03/21/19   [name], MD  Blood Glucose Monitoring Suppl (ONETOUCH VERIO) w/Device KIT 1 Device by Does not apply route 2 (two) times daily. 04/01/19   Loman [place], FNP  Capsaicin (ICY  HOT ARTHRITIS THERAPY EX) Apply 1 application topically 2 (two) times daily as needed (back pain).     [provider]  carvedilol (COREG) 6.25 MG tablet Take 1.5 tablets (9.375 mg total) by mouth 2 (two) times daily with a meal. Last refill without office visit Please call [phone] 03/24/20   Larey Dresser, MD  Cholecalciferol (VITAMIN D3) 25 MCG (1000 UT) CAPS Take 2,000 Units by mouth daily.    [provider]  Continuous Blood Gluc Sensor (FREESTYLE LIBRE 2 SENSOR) MISC Use to test blood sugar up to 6 times daily as directed. E11.9 09/23/19   [name], MD  fluticasone (FLONASE) 50 MCG/ACT nasal spray INSTILL 2 SPRAYS INTO EACH NOSTRIL DAILY. Patient taking differently: Place 2 sprays into both nostrils daily. 01/20/20   [name], MD  glucose blood (ONETOUCH VERIO) test strip USE FOUR TIMES  DAILY AS NEEDED Dx E11.9 08/30/19   [name], MD  hydrALAZINE (APRESOLINE) 100 MG tablet TAKE (1) TABLET BY MOUTH (3) TIMES DAILY. Patient taking differently: Take 100 mg by mouth 3 (three) times daily. 12/23/19   [name], MD  icosapent Ethyl (VASCEPA) 1 g capsule Take 2 capsules (2 g total) by mouth 2 (two) times daily. 02/04/19   Larey Dresser, MD  insulin glargine, 2 Unit Dial, (TOUJEO MAX SOLOSTAR) 300 UNIT/ML Solostar Pen Inject 10 Units into the skin at bedtime. 02/07/20   Orson Eva, MD  isosorbide mononitrate (IMDUR) 120 MG 24 hr tablet TAKE (1) TABLET BY MOUTH ONCE DAILY. Patient taking differently: Take 120 mg by mouth daily. 12/23/19   [name], MD  Lancets [facility] ULTRASOFT) lancets Use as instructed   DX E11.9 08/30/19   [name], MD  loratadine (CLARITIN) 10 MG tablet Take 1 tablet (10 mg total) by mouth daily. 04/12/19   Janora Norlander, DO  Multiple Vitamins-Minerals (CENTRUM SILVER ADULT 50+ PO) Take 1 tablet by mouth daily.    [provider]  nitroGLYCERIN (NITROSTAT) 0.4 MG SL tablet DISSOLVE 1 TAB UNDER TOUNGE FOR CHEST  PAIN. MAY REPEAT EVERY 5 MINUTES FOR 3 DOSES. IF NO RELIEF CALL 911 OR GO TO ER 09/20/18   [name], MD  Omega-3 Fatty Acids (FISH OIL) 1000 MG CPDR Take 1,000 mg by mouth daily.     [provider]  pantoprazole (PROTONIX) 40 MG tablet Take 1 tablet (40 mg total) by mouth daily. 07/11/19   [name], MD  pregabalin (LYRICA) 200 MG capsule Take 2 capsules (400 mg total) by mouth at bedtime. 03/18/19   [name], MD  PROAIR HFA 108 [phone] Base) MCG/ACT inhaler USE 2 PUFFS EVERY 6 HOURS AS NEEDED 11/11/19   [name], MD  ranolazine (RANEXA) 500 MG 12 hr tablet TAKE 1 TABLET BY MOUTH TWICE DAILY. 06/24/19   Larey Dresser, MD  rosuvastatin (CRESTOR) 10 MG tablet TAKE 1 TABLET BY MOUTH ONCE A DAY. 12/23/19   [name], MD  Semaglutide, 1 MG/DOSE, (OZEMPIC, 1 MG/DOSE,) 2 MG/1.5ML SOPN Inject 1 mg into the skin once a week. 12/09/19   [name], MD  terbinafine (LAMISIL) 1 % cream Apply 1 application topically 2 (two) times daily. Apply to both feet and between toes 04/10/18   [name], MD  tiZANidine (ZANAFLEX) 4 MG tablet Take 1 tablet (4 mg total) by mouth every 6 (six) hours as needed for muscle spasms. 09/03/19   [name], MD  torsemide (DEMADEX) 20 MG tablet Take 3 tablets (60 mg total) by mouth 2 (two) times daily. 02/07/20   Orson Eva, MD  Vitamin D, Ergocalciferol, (DRISDOL) 1.25 MG (50000 UNIT) CAPS capsule Take 1 capsule (50,000 Units total) by mouth every 7 (seven) days. Patient taking differently: Take 50,000 Units by mouth daily. 12/10/19 12/08/20  [name], MD  XARELTO 2.5 MG TABS tablet TAKE (1) TABLET BY MOUTH TWICE DAILY. Patient taking differently: Take 2.5 mg by mouth 2 (two) times daily. 09/27/19   Evans Lance, MD    Allergies    Sulfa antibiotics and Penicillins  Review of Systems   Review of Systems  Musculoskeletal: Positive for joint swelling. Negative for back pain.  Skin: Positive for wound.  Neurological: Positive for  numbness.    Physical Exam Updated Vital Signs BP (!) 146/69 (BP Location: Left Arm)   Pulse 78   Temp 98.3 F (36.8 C) (Oral)  Resp (!) 21   Ht _0  (1.753 m)   Wt 95.7 kg   SpO2 97%   BMI 31.16 kg/m   Physical Exam Vitals and nursing note reviewed.  Constitutional:      General: He is not in acute distress.    Appearance: He is well-developed and well-nourished. He is not diaphoretic.  HENT:     Head: Normocephalic and atraumatic.  Eyes:     General: No scleral icterus.    Conjunctiva/sclera: Conjunctivae normal.  Cardiovascular:     Rate and Rhythm: Normal rate and regular rhythm.     Heart sounds: Normal heart sounds.  Pulmonary:     Effort: Pulmonary effort is normal. No respiratory distress.     Breath sounds: Normal breath sounds.  Abdominal:     Palpations: Abdomen is soft.     Tenderness: There is no abdominal tenderness.  Musculoskeletal:        General: No edema.     Cervical back: Normal range of motion and neck supple.     Right ankle: Swelling present. Tenderness present over the lateral malleolus and medial malleolus. No proximal fibula tenderness. Decreased range of motion.  Skin:    General: Skin is warm and dry.  Neurological:     Mental Status: He is alert.  Psychiatric:        Behavior: Behavior normal.     ED Results / Procedures / Treatments   Labs (all labs ordered are listed, but only abnormal results are displayed) Labs Reviewed - No data to display  EKG None  Radiology No results found.  Procedures Reduction of fracture  Date/Time: 04/17/2020 10:50 PM Performed by: Margarita Mail, PA-C Authorized by: Margarita Mail, PA-C  Consent given by: patient Patient understanding: patient states understanding of the procedure being performed Patient identity confirmed: verbally with patient Local anesthesia used: no  Anesthesia: Local anesthesia used: no  Sedation: Patient sedated: no  Patient tolerance: patient tolerated  the procedure well with no immediate complications Comments: Postreduction film shows interval reduction of trimall fracture with greatly improved alignment  .Splint Application  Date/Time: 04/17/2020 10:51 PM Performed by: Margarita Mail, PA-C Authorized by: Margarita Mail, PA-C   Consent:    Consent obtained:  Verbal   Consent given by:  Patient   Risks, benefits, and alternatives were discussed: yes     Risks discussed:  Discoloration, numbness, pain and swelling Universal protocol:    Patient identity confirmed:  Verbally with patient Pre-procedure details:    Distal neurologic exam:  Numbness   Distal perfusion: distal pulses strong and brisk capillary refill   Procedure details:    Location:  Ankle   Ankle location:  R ankle   Splint type:  Ankle stirrup and short leg   Supplies:  Cotton padding and fiberglass   Attestation: Splint applied and adjusted personally by me   Post-procedure details:    Distal neurologic exam:  Numbness   Distal perfusion: distal pulses strong     Procedure completion:  Tolerated well, no immediate complications   Post-procedure imaging: reviewed      Medications Ordered in ED Medications - No data to display  ED Course  I have reviewed the triage vital signs and the nursing notes.  Pertinent labs & imaging results that were available during my care of the patient were reviewed by me and considered in my medical decision making (see chart for details).    MDM Rules/Calculators/A&P  Patient here with right trimalleolar fracture.  Fracture reduced and splint placed.  Patient tolerating crutches.  He has minimal pain at this time.  Case discussed with Dr. Aline Brochure who asked that he call office to set up a follow-up appointment.  Patient will be discharged to follow closely with Dr. Aline Brochure will likely need surgical reduction and fixation Final Clinical Impression(s) / ED Diagnoses Final diagnoses:  None    Rx  / DC Orders ED Discharge Orders    None       Margarita Mail, PA-C 04/17/20 2253    Margette Fast, MD 04/20/20 1022

## 2020-04-17 NOTE — Discharge Instructions (Addendum)
Please call Dr. Ruthe Mannan office first thing tomorrow morning to get a close follow up appointment for your broken ankle, Contact a health care provider if: You have pain or swelling that gets worse or does not get better with rest or medicine. Your cast gets damaged. Get help right away if: You have severe pain that lasts. You develop new pain or swelling. Your skin or toenails below the injury turn blue or gray, feel cold, become numb, or are less sensitive to the touch.

## 2020-04-17 NOTE — ED Notes (Signed)
Pt attempting to call for ride home

## 2020-04-20 ENCOUNTER — Ambulatory Visit: Payer: Medicare Other | Admitting: Orthopedic Surgery

## 2020-04-20 MED FILL — Oxycodone w/ Acetaminophen Tab 5-325 MG: ORAL | Qty: 6 | Status: AC

## 2020-04-22 ENCOUNTER — Ambulatory Visit: Payer: Medicare Other

## 2020-04-22 ENCOUNTER — Encounter: Payer: Self-pay | Admitting: Orthopedic Surgery

## 2020-04-22 ENCOUNTER — Ambulatory Visit: Payer: Medicare Other | Admitting: Nurse Practitioner

## 2020-04-22 ENCOUNTER — Other Ambulatory Visit: Payer: Self-pay | Admitting: Cardiology

## 2020-04-22 ENCOUNTER — Telehealth: Payer: Self-pay | Admitting: Orthopedic Surgery

## 2020-04-22 ENCOUNTER — Other Ambulatory Visit: Payer: Self-pay

## 2020-04-22 ENCOUNTER — Ambulatory Visit (INDEPENDENT_AMBULATORY_CARE_PROVIDER_SITE_OTHER): Payer: Medicare Other | Admitting: Orthopedic Surgery

## 2020-04-22 VITALS — BP 155/71 | HR 84 | Ht 69.0 in | Wt 211.0 lb

## 2020-04-22 DIAGNOSIS — S82851A Displaced trimalleolar fracture of right lower leg, initial encounter for closed fracture: Secondary | ICD-10-CM | POA: Diagnosis not present

## 2020-04-22 DIAGNOSIS — R6889 Other general symptoms and signs: Secondary | ICD-10-CM | POA: Diagnosis not present

## 2020-04-22 NOTE — Progress Notes (Signed)
Patient suffers from fracture right  ankle which impairs their ability to perform daily activities like dressing, feeding, grooming, toileting in the home. A cane or walker will not resolve issue with performing activities of daily living. A wheelchair will allow patient to safely perform daily activities. Patient can safely propel the wheelchair in the home or has a caregiver who can provide assistance. Length of need 12 months  Accessories: elevating leg rests (ELRs), wheel locks, extensions and anti-tippers.

## 2020-04-22 NOTE — Telephone Encounter (Signed)
Patient called in stating that he thought Dr Aline Brochure was going to give him something for his pain.  He said nothing was sent in to for him to [place]  He wants Dr. Aline Brochure to send in prescription for pain medication to [facility]

## 2020-04-22 NOTE — Progress Notes (Signed)
NEW PROBLEM//OFFICE VISIT  Summary assessment and plan:  Currently recommending nonoperative treatment with close follow-up.  Patient has too many serious medical problems including stage IV kidney disease coronary artery disease status post ICD in 2021 stent placement as well with anticoagulation and diabetes  X-ray in splint in 1 week  Chief Complaint  Patient presents with  . Ankle Injury    Feb 25th 2022 Right ankle     65 year old male status post mechanical fall on 25 February had a closed reduction of a trimalleolar fracture placed in a splint comes in complaining of pain  Patient also is having to live with his ex-wife due to the fact that he lives alone  Patient is on Xarelto has severe coronary artery disease as noted in the past medical history   Review of Systems  Constitutional: Negative for chills, fever and weight loss.  Respiratory: Negative for shortness of breath.   Cardiovascular: Negative for chest pain.  Neurological: Negative for dizziness.  All other systems reviewed and are negative.    Past Medical History:  Diagnosis Date  . CAD (coronary artery disease)    DES to RCA January 2020  . Chronic back pain   . CKD (chronic kidney disease), stage IV ([place])   . Diverticulosis   . Essential hypertension   . GERD (gastroesophageal reflux disease)   . Hyperlipidemia   . ICD (implantable cardioverter-defibrillator) in place    [facility] - Dr. Lovena Le, implanted April 2021  . Onychomycosis   . Polysubstance abuse ([place])   . 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 Type 2 diabetes mellitus ([place])     Past Surgical History:  Procedure Laterality Date  . APPENDECTOMY    . CARDIAC CATHETERIZATION  2005   4 frech cath  . COLONOSCOPY N/A 08/01/2013   Procedure: COLONOSCOPY;  Surgeon: Rogene Houston, MD;  [facility];  Service: Endoscopy;  Laterality: N/A;  rescheduled to [place] notified pt  . CORONARY STENT  INTERVENTION N/A 03/19/2018   Procedure: CORONARY STENT INTERVENTION;  Surgeon: Leonie Man, MD;  Location: [place] [facility];  Service: Cardiovascular;  Laterality: N/A;  . ESOPHAGOGASTRODUODENOSCOPY N/A 04/23/2015   Procedure: ESOPHAGOGASTRODUODENOSCOPY (EGD);  Surgeon: Rogene Houston, MD;  [facility]: [facility];  Service: Endoscopy;  Laterality: N/A;  1:25  . RIGHT/LEFT HEART CATH AND CORONARY ANGIOGRAPHY N/A 06/03/2016   Procedure: Right/Left Heart Cath and Coronary Angiography;  Surgeon: Belva Crome, MD;  Location: [place] [facility];  Service: Cardiovascular;  Laterality: N/A;  . RIGHT/LEFT HEART CATH AND CORONARY ANGIOGRAPHY N/A 03/19/2018   Procedure: RIGHT/LEFT HEART CATH AND CORONARY ANGIOGRAPHY;  Surgeon: Jolaine Artist, MD;  Location: [place] [facility];  Service: Cardiovascular;  Laterality: N/A;  . SUBQ ICD IMPLANT N/A 05/27/2019   Procedure: SUBQ ICD IMPLANT;  Surgeon: Evans Lance, MD;  Location: [place] [facility];  Service: Cardiovascular;  Laterality: N/A;    Family History  Problem Relation Age of Onset  . Hypertension Father   . Diabetes Father   . Lung cancer Mother   . Alcohol abuse Brother   . Colon cancer Neg Hx    Social History   Tobacco Use  . Smoking status: Never Smoker  . Smokeless tobacco: Current User    Types: Snuff  . Tobacco comment: dips snuff about 6 cans/week for 3 years  Vaping Use  . Vaping Use: Never used  Substance Use Topics  .  Alcohol use: Yes    Alcohol/week: 1.0 standard drink    Types: 1 Cans of beer per week    Comment: occ - has cut down, now once every 2-3 months  . Drug use: Yes    Types: Marijuana    Comment: occasional use - smoking less    Allergies  Allergen Reactions  . Sulfa Antibiotics Shortness Of Breath  . Penicillins Rash and Other (See Comments)    Has patient had a PCN reaction causing immediate rash, facial/tongue/throat swelling, SOB or lightheadedness with hypotension: No Has patient had a PCN  reaction causing severe rash involving mucus membranes or skin necrosis: No Has patient had a PCN reaction that required hospitalization No Has patient had a PCN reaction occurring within the last 10 years: No If all of the above answers are "NO", then may proceed with Cephalosporin use.     Current Meds  Medication Sig  . aspirin EC 81 MG tablet Take 81 mg by mouth daily.  . blood glucose meter kit and supplies KIT Dispense based on patient and insurance preference. Test four times daily as directed. (FOR ICD-10 : E11.9  . Blood Glucose Monitoring Suppl (ONETOUCH VERIO) w/Device KIT 1 Device by Does not apply route 2 (two) times daily.  . Capsaicin (ICY HOT ARTHRITIS THERAPY EX) Apply 1 application topically 2 (two) times daily as needed (back pain).   . carvedilol (COREG) 6.25 MG tablet TAKE 1 AND 1/2 TABLET BY MOUTH TWICE DAILY.  [name] Cholecalciferol (VITAMIN D3) 25 MCG (1000 UT) CAPS Take 2,000 Units by mouth daily.  . Continuous Blood Gluc Sensor (FREESTYLE LIBRE 2 SENSOR) MISC Use to test blood sugar up to 6 times daily as directed. E11.9  . fluticasone (FLONASE) 50 MCG/ACT nasal spray INSTILL 2 SPRAYS INTO EACH NOSTRIL DAILY. (Patient taking differently: Place 2 sprays into both nostrils daily.)  . glucose blood (ONETOUCH VERIO) test strip USE FOUR TIMES DAILY AS NEEDED Dx E11.9  . hydrALAZINE (APRESOLINE) 100 MG tablet TAKE (1) TABLET BY MOUTH (3) TIMES DAILY. (Patient taking differently: Take 100 mg by mouth 3 (three) times daily.)  . icosapent Ethyl (VASCEPA) 1 g capsule Take 2 capsules (2 g total) by mouth 2 (two) times daily.  . insulin glargine, 2 Unit Dial, (TOUJEO MAX SOLOSTAR) 300 UNIT/ML Solostar Pen Inject 10 Units into the skin at bedtime.  . isosorbide mononitrate (IMDUR) 120 MG 24 hr tablet TAKE (1) TABLET BY MOUTH ONCE DAILY. (Patient taking differently: Take 120 mg by mouth daily.)  . Lancets (ONETOUCH ULTRASOFT) lancets Use as instructed   DX E11.9  . loratadine (CLARITIN)  10 MG tablet Take 1 tablet (10 mg total) by mouth daily.  . Multiple Vitamins-Minerals (CENTRUM SILVER ADULT 50+ PO) Take 1 tablet by mouth daily.  . nitroGLYCERIN (NITROSTAT) 0.4 MG SL tablet DISSOLVE 1 TAB UNDER TOUNGE FOR CHEST PAIN. MAY REPEAT EVERY 5 MINUTES FOR 3 DOSES. IF NO RELIEF CALL 911 OR GO TO ER  . Omega-3 Fatty Acids (FISH OIL) 1000 MG CPDR Take 1,000 mg by mouth daily.   [name] oxyCODONE-acetaminophen (PERCOCET/ROXICET) 5-325 MG tablet Take 1 tablet by mouth every 4 (four) hours as needed for severe pain.  . pantoprazole (PROTONIX) 40 MG tablet Take 1 tablet (40 mg total) by mouth daily.  . pregabalin (LYRICA) 200 MG capsule Take 2 capsules (400 mg total) by mouth at bedtime.  [name] PROAIR HFA 108 (90 Base) MCG/ACT inhaler USE 2 PUFFS EVERY 6 HOURS AS NEEDED  . ranolazine (  RANEXA) 500 MG 12 hr tablet TAKE 1 TABLET BY MOUTH TWICE DAILY.  . rosuvastatin (CRESTOR) 10 MG tablet TAKE 1 TABLET BY MOUTH ONCE A DAY.  [name] Semaglutide, 1 MG/DOSE, (OZEMPIC, 1 MG/DOSE,) 2 MG/1.5ML SOPN Inject 1 mg into the skin once a week.  . terbinafine (LAMISIL) 1 % cream Apply 1 application topically 2 (two) times daily. Apply to both feet and between toes  . tiZANidine (ZANAFLEX) 4 MG tablet Take 1 tablet (4 mg total) by mouth every 6 (six) hours as needed for muscle spasms.  [name] torsemide (DEMADEX) 20 MG tablet Take 3 tablets (60 mg total) by mouth 2 (two) times daily.  . Vitamin D, Ergocalciferol, (DRISDOL) 1.25 MG (50000 UNIT) CAPS capsule Take 1 capsule (50,000 Units total) by mouth every 7 (seven) days. (Patient taking differently: Take 50,000 Units by mouth daily.)  . XARELTO 2.5 MG TABS tablet TAKE (1) TABLET BY MOUTH TWICE DAILY. (Patient taking differently: Take 2.5 mg by mouth 2 (two) times daily.)    BP (!) 155/71   Pulse 84   Ht 5' 9"  (1.753 m)   Wt 211 lb (95.7 kg)   BMI 31.16 kg/m   Physical Exam Constitutional:      General: He is not in acute distress.    Appearance: He is well-developed.      Comments: Well developed, well nourished Normal grooming and hygiene     Cardiovascular:     Comments: No peripheral edema Musculoskeletal:     Comments: Ankle is left in the splint neurovascular exam of the foot looks normal  Skin:    General: Skin is warm and dry.  Neurological:     Mental Status: He is alert and oriented to person, place, and time.     Sensory: No sensory deficit.     Coordination: Coordination normal.     Gait: Gait normal.     Deep Tendon Reflexes: Reflexes are normal and symmetric.  Psychiatric:        Mood and Affect: Mood normal.        Behavior: Behavior normal.        Thought Content: Thought content normal.        Judgment: Judgment normal.     Comments: Affect normal          MEDICAL DECISION MAKING  A.  Encounter Diagnosis  Name Primary?  . Closed displaced trimalleolar fracture of right ankle, initial encounter Yes    B. DATA ANALYSED:   IMAGING: Interpretation of images: External images from the hospital show a displaced trimalleolar fracture with a good closed reduction  I repeated the x-rays which shows the maintenance of reduction Orders: None  Outside records reviewed: ER   C. MANAGEMENT   closed treatment for now   65 year old male with end-stage renal failure, coronary artery disease, ICD, on anticoagulation with a fracture which is in good alignment but needs close follow-up  No orders of the defined types were placed in this encounter.     Arther Abbott, MD  04/22/2020 12:26 PM

## 2020-04-23 ENCOUNTER — Other Ambulatory Visit: Payer: Self-pay | Admitting: Orthopedic Surgery

## 2020-04-23 MED ORDER — ACETAMINOPHEN-CODEINE #3 300-30 MG PO TABS: 1.0000 | ORAL_TABLET | Freq: Four times a day (QID) | ORAL | 0 refills | Status: AC | PRN

## 2020-04-23 NOTE — Progress Notes (Signed)
Meds ordered this encounter  Medications  . acetaminophen-codeine (TYLENOL #3) 300-30 MG tablet    Sig: Take 1 tablet by mouth every 6 (six) hours as needed for up to 7 days for moderate pain.    Dispense:  28 tablet    Refill:  0

## 2020-04-27 ENCOUNTER — Ambulatory Visit: Payer: Medicare Other | Admitting: Orthopedic Surgery

## 2020-04-28 ENCOUNTER — Encounter (HOSPITAL_COMMUNITY): Payer: Medicare Other

## 2020-04-28 ENCOUNTER — Telehealth (HOSPITAL_COMMUNITY): Payer: Self-pay | Admitting: Cardiology

## 2020-04-28 NOTE — Telephone Encounter (Signed)
Attempted to contact patient for reminder call No answer unable to leave message voicemail full

## 2020-04-30 ENCOUNTER — Ambulatory Visit (INDEPENDENT_AMBULATORY_CARE_PROVIDER_SITE_OTHER): Payer: Medicare Other | Admitting: Orthopedic Surgery

## 2020-04-30 ENCOUNTER — Telehealth: Payer: Self-pay | Admitting: Radiology

## 2020-04-30 ENCOUNTER — Ambulatory Visit: Payer: Medicare Other

## 2020-04-30 ENCOUNTER — Other Ambulatory Visit: Payer: Self-pay

## 2020-04-30 DIAGNOSIS — S82851D Displaced trimalleolar fracture of right lower leg, subsequent encounter for closed fracture with routine healing: Secondary | ICD-10-CM | POA: Diagnosis not present

## 2020-04-30 DIAGNOSIS — R6889 Other general symptoms and signs: Secondary | ICD-10-CM | POA: Diagnosis not present

## 2020-04-30 MED ORDER — HYDROCODONE-ACETAMINOPHEN 5-325 MG PO TABS: 1.0000 | ORAL_TABLET | Freq: Four times a day (QID) | ORAL | 0 refills | Status: AC | PRN

## 2020-04-30 NOTE — Telephone Encounter (Signed)
Patient states he called in x 3 for pain meds last week  No  Message was sent to [place] for refill  He wants someone to know.

## 2020-04-30 NOTE — Progress Notes (Signed)
Meds ordered this encounter  Medications  . HYDROcodone-acetaminophen (NORCO/VICODIN) 5-325 MG tablet    Sig: Take 1 tablet by mouth every 6 (six) hours as needed for moderate pain.    Dispense:  30 tablet    Refill:  0   Encounter Diagnosis  Name Primary?  . Closed displaced trimalleolar fracture of right ankle with routine healing, subsequent encounter Yes    65 year old male with coronary artery disease chronic kidney disease stage IV central hypertension he has an implantable cardioverter defibrillator and is diabetic he has a trimalleolar fracture which is minimally displaced we are trying to treated in a cast his x-ray today shows the fracture is maintained stable ankle mortise  I placed him in a cast he has an abrasion over the front of the tibia we put Xeroform 4 x 4 and Kling wrap over it  X-ray in a week

## 2020-04-30 NOTE — Patient Instructions (Signed)
NO WEIGHT ON ANKLE

## 2020-05-07 ENCOUNTER — Ambulatory Visit: Payer: Medicare Other | Admitting: Orthopedic Surgery

## 2020-05-17 LAB — CUP PACEART REMOTE DEVICE CHECK
Battery Remaining Percentage: 90 %
Date Time Interrogation Session: 20220325162500
Implantable Lead Implant Date: 20210405
Implantable Lead Location: 753862
Implantable Lead Model: 3501
Implantable Lead Serial Number: 178864
Implantable Pulse Generator Implant Date: 20210405
Pulse Gen Serial Number: 135565

## 2020-05-18 ENCOUNTER — Ambulatory Visit: Payer: Medicare Other

## 2020-05-18 ENCOUNTER — Ambulatory Visit: Payer: Medicare Other | Admitting: Orthopedic Surgery

## 2020-05-18 ENCOUNTER — Ambulatory Visit (INDEPENDENT_AMBULATORY_CARE_PROVIDER_SITE_OTHER): Payer: Medicare Other

## 2020-05-18 ENCOUNTER — Ambulatory Visit (INDEPENDENT_AMBULATORY_CARE_PROVIDER_SITE_OTHER): Payer: Medicare Other | Admitting: Orthopedic Surgery

## 2020-05-18 ENCOUNTER — Other Ambulatory Visit: Payer: Self-pay

## 2020-05-18 DIAGNOSIS — I428 Other cardiomyopathies: Secondary | ICD-10-CM | POA: Diagnosis not present

## 2020-05-18 DIAGNOSIS — S82851D Displaced trimalleolar fracture of right lower leg, subsequent encounter for closed fracture with routine healing: Secondary | ICD-10-CM

## 2020-05-18 MED ORDER — PROMETHAZINE HCL 12.5 MG PO TABS: 12.5000 mg | ORAL_TABLET | Freq: Four times a day (QID) | ORAL | 0 refills | Status: AC | PRN

## 2020-05-18 NOTE — Progress Notes (Signed)
Chief Complaint  Patient presents with  . Ankle Injury    R/ doing much better, pain is just every now and then.    FRACTURE CARE   DOI 2/25//FOV 3/2   4 WEEKS 3 DAYS   He complains of nausea vomiting every couple of days he says he is only taken 1 or 2 hydrocodone's as needed maybe once or twice a day or every other day  His ankle x-ray looks good the mortise is intact cast is intact I will send over some Phenergan and he also wants a knee walker  X-ray in 4 weeks  Meds ordered this encounter  Medications  . promethazine (PHENERGAN) 12.5 MG tablet    Sig: Take 1 tablet (12.5 mg total) by mouth every 6 (six) hours as needed for nausea or vomiting.    Dispense:  30 tablet    Refill:  0

## 2020-05-22 ENCOUNTER — Other Ambulatory Visit: Payer: Self-pay | Admitting: Family Medicine

## 2020-05-28 DIAGNOSIS — D638 Anemia in other chronic diseases classified elsewhere: Secondary | ICD-10-CM | POA: Diagnosis not present

## 2020-05-28 DIAGNOSIS — E1129 Type 2 diabetes mellitus with other diabetic kidney complication: Secondary | ICD-10-CM | POA: Diagnosis not present

## 2020-05-28 DIAGNOSIS — N189 Chronic kidney disease, unspecified: Secondary | ICD-10-CM | POA: Diagnosis not present

## 2020-05-28 DIAGNOSIS — E1122 Type 2 diabetes mellitus with diabetic chronic kidney disease: Secondary | ICD-10-CM | POA: Diagnosis not present

## 2020-05-28 DIAGNOSIS — R809 Proteinuria, unspecified: Secondary | ICD-10-CM | POA: Diagnosis not present

## 2020-05-28 NOTE — Progress Notes (Signed)
Remote ICD transmission.   

## 2020-05-30 ENCOUNTER — Encounter: Payer: Self-pay | Admitting: Pulmonary Disease

## 2020-05-30 DIAGNOSIS — E785 Hyperlipidemia, unspecified: Secondary | ICD-10-CM | POA: Diagnosis not present

## 2020-05-30 DIAGNOSIS — I517 Cardiomegaly: Secondary | ICD-10-CM | POA: Diagnosis not present

## 2020-05-30 DIAGNOSIS — R7402 Elevation of levels of lactic acid dehydrogenase (LDH): Secondary | ICD-10-CM | POA: Diagnosis not present

## 2020-05-30 DIAGNOSIS — Z4502 Encounter for adjustment and management of automatic implantable cardiac defibrillator: Secondary | ICD-10-CM | POA: Diagnosis not present

## 2020-05-30 DIAGNOSIS — Z20822 Contact with and (suspected) exposure to covid-19: Secondary | ICD-10-CM | POA: Diagnosis not present

## 2020-05-30 DIAGNOSIS — Z743 Need for continuous supervision: Secondary | ICD-10-CM | POA: Diagnosis not present

## 2020-05-30 DIAGNOSIS — Z950 Presence of cardiac pacemaker: Secondary | ICD-10-CM | POA: Diagnosis not present

## 2020-05-30 DIAGNOSIS — E875 Hyperkalemia: Secondary | ICD-10-CM | POA: Diagnosis not present

## 2020-05-30 DIAGNOSIS — E1122 Type 2 diabetes mellitus with diabetic chronic kidney disease: Secondary | ICD-10-CM | POA: Diagnosis not present

## 2020-05-30 DIAGNOSIS — Z794 Long term (current) use of insulin: Secondary | ICD-10-CM | POA: Diagnosis not present

## 2020-05-30 DIAGNOSIS — R404 Transient alteration of awareness: Secondary | ICD-10-CM | POA: Diagnosis not present

## 2020-05-30 DIAGNOSIS — I469 Cardiac arrest, cause unspecified: Secondary | ICD-10-CM | POA: Diagnosis not present

## 2020-05-30 DIAGNOSIS — I2510 Atherosclerotic heart disease of native coronary artery without angina pectoris: Secondary | ICD-10-CM | POA: Diagnosis not present

## 2020-05-30 DIAGNOSIS — R778 Other specified abnormalities of plasma proteins: Secondary | ICD-10-CM | POA: Diagnosis not present

## 2020-05-30 DIAGNOSIS — E1165 Type 2 diabetes mellitus with hyperglycemia: Secondary | ICD-10-CM | POA: Diagnosis not present

## 2020-05-30 DIAGNOSIS — I129 Hypertensive chronic kidney disease with stage 1 through stage 4 chronic kidney disease, or unspecified chronic kidney disease: Secondary | ICD-10-CM | POA: Diagnosis not present

## 2020-05-30 DIAGNOSIS — I499 Cardiac arrhythmia, unspecified: Secondary | ICD-10-CM | POA: Diagnosis not present

## 2020-05-30 DIAGNOSIS — R4781 Slurred speech: Secondary | ICD-10-CM | POA: Diagnosis not present

## 2020-05-30 DIAGNOSIS — R569 Unspecified convulsions: Secondary | ICD-10-CM | POA: Diagnosis not present

## 2020-05-30 DIAGNOSIS — R6889 Other general symptoms and signs: Secondary | ICD-10-CM | POA: Diagnosis not present

## 2020-05-30 DIAGNOSIS — Z87891 Personal history of nicotine dependence: Secondary | ICD-10-CM | POA: Diagnosis not present

## 2020-05-30 DIAGNOSIS — N189 Chronic kidney disease, unspecified: Secondary | ICD-10-CM | POA: Diagnosis not present

## 2020-05-30 NOTE — Progress Notes (Unsigned)
Transfer request:  Brad Mendoza [phone]  Received call from Dr. Lawernce Ion at [facility] requesting transfer for patient who is intubated and critically ill, post seizures, post cardiac arrest and possible stroke.  Extensive PMH including diabetes, HTN, CAD,DES to RCA Jan 2020, HFrEF, cardiomyopathy, CKD stage IV, HLD, s/p ICD placement, polysubstance abuse. Also noncompliance.   65 yr old male, seemed fine this AM according to the wife.  He went to take a nap this afternoon around 4:30pm, then few minutes into his nap, he called out to wife, was moaning, had slurred speech, and confused but was awake and alert.  Wife called 911.  Upon EMS arrival, he had Generalized tonic clonic seizure, and  received Versed.   Respiratory status got worse during transport, and had Agonal, found to have no pulse during transfer on stretcher on arrival to the ED.  CPR started, ROSC after 3 - 4 rounds, non-shockable rhythm. Epi x 3, Amiodarone, Calcium Copious projectile Vomiting after ROSC Agonal respiration OG dropped before, suctioned, then uncomplicated intubation Non-sustained VT afterwards, now in sinus tach.  Initial BPs with systolic in the 128N.  Concern for stroke, last seen normal 1630. CT head no acute abnormalities. TeleNeuro consult - requested CTA head but unable to obtain due to AKI Cr 2.88.  Neuro thinks mental status more metabolic, and to correct glycemia and electrolytes, not certained if stroke, unable to get CTA due to Cr.   Glucose 829 AG 14 Cr 2.88 K 4.7 Bun 43 Bicarb 22 Na 133  Hgb 10.6  LA 8.9  Hs Trop 57 cpk 593  ABG after intubation: 7.2, 45, 109, bicarb 18.1 after intubation  CXR:  Cardiomegaly CT head:  Nothing acute  Loaded with Keppra 1,500mg  2L IVF bolus, now running 1/2NS at 125 cc /hr Insulin drip started, glucose still > 600  Bucking vent, will start on Versed drip  Fentanyl 50 mcg drip currently   No vasopressors  Physical  exam: Nothing purposeful   Current Vitals:   141/92 70 21 96%   IV access:  PIV x2  Next of kin:  Wife present  In the ED currently  Patient accepted:  To come by ground   ___________________ Samuella Cota. Reinaldo Berber, MD [facility]

## 2020-05-31 ENCOUNTER — Inpatient Hospital Stay (HOSPITAL_COMMUNITY): Payer: Medicare Other

## 2020-05-31 ENCOUNTER — Inpatient Hospital Stay (HOSPITAL_COMMUNITY)
Admission: AD | Admit: 2020-05-31 | Discharge: 2020-06-21 | DRG: 296 | Disposition: E | Payer: Medicare Other | Source: Other Acute Inpatient Hospital | Attending: Pulmonary Disease | Admitting: Pulmonary Disease

## 2020-05-31 DIAGNOSIS — F05 Delirium due to known physiological condition: Secondary | ICD-10-CM | POA: Diagnosis not present

## 2020-05-31 DIAGNOSIS — G8929 Other chronic pain: Secondary | ICD-10-CM | POA: Diagnosis present

## 2020-05-31 DIAGNOSIS — R918 Other nonspecific abnormal finding of lung field: Secondary | ICD-10-CM | POA: Diagnosis not present

## 2020-05-31 DIAGNOSIS — I25118 Atherosclerotic heart disease of native coronary artery with other forms of angina pectoris: Secondary | ICD-10-CM | POA: Diagnosis not present

## 2020-05-31 DIAGNOSIS — I25119 Atherosclerotic heart disease of native coronary artery with unspecified angina pectoris: Secondary | ICD-10-CM | POA: Diagnosis present

## 2020-05-31 DIAGNOSIS — Z95810 Presence of automatic (implantable) cardiac defibrillator: Secondary | ICD-10-CM | POA: Diagnosis not present

## 2020-05-31 DIAGNOSIS — R34 Anuria and oliguria: Secondary | ICD-10-CM | POA: Diagnosis not present

## 2020-05-31 DIAGNOSIS — E785 Hyperlipidemia, unspecified: Secondary | ICD-10-CM | POA: Diagnosis not present

## 2020-05-31 DIAGNOSIS — I517 Cardiomegaly: Secondary | ICD-10-CM | POA: Diagnosis not present

## 2020-05-31 DIAGNOSIS — N179 Acute kidney failure, unspecified: Secondary | ICD-10-CM

## 2020-05-31 DIAGNOSIS — Z20822 Contact with and (suspected) exposure to covid-19: Secondary | ICD-10-CM | POA: Diagnosis present

## 2020-05-31 DIAGNOSIS — E875 Hyperkalemia: Secondary | ICD-10-CM | POA: Diagnosis not present

## 2020-05-31 DIAGNOSIS — R29818 Other symptoms and signs involving the nervous system: Secondary | ICD-10-CM | POA: Diagnosis not present

## 2020-05-31 DIAGNOSIS — M545 Low back pain, unspecified: Secondary | ICD-10-CM | POA: Diagnosis present

## 2020-05-31 DIAGNOSIS — R4781 Slurred speech: Secondary | ICD-10-CM | POA: Diagnosis not present

## 2020-05-31 DIAGNOSIS — E1101 Type 2 diabetes mellitus with hyperosmolarity with coma: Secondary | ICD-10-CM | POA: Diagnosis present

## 2020-05-31 DIAGNOSIS — K219 Gastro-esophageal reflux disease without esophagitis: Secondary | ICD-10-CM | POA: Diagnosis present

## 2020-05-31 DIAGNOSIS — R778 Other specified abnormalities of plasma proteins: Secondary | ICD-10-CM | POA: Diagnosis not present

## 2020-05-31 DIAGNOSIS — I639 Cerebral infarction, unspecified: Secondary | ICD-10-CM

## 2020-05-31 DIAGNOSIS — I462 Cardiac arrest due to underlying cardiac condition: Secondary | ICD-10-CM

## 2020-05-31 DIAGNOSIS — Z955 Presence of coronary angioplasty implant and graft: Secondary | ICD-10-CM

## 2020-05-31 DIAGNOSIS — I130 Hypertensive heart and chronic kidney disease with heart failure and stage 1 through stage 4 chronic kidney disease, or unspecified chronic kidney disease: Secondary | ICD-10-CM | POA: Diagnosis not present

## 2020-05-31 DIAGNOSIS — E1122 Type 2 diabetes mellitus with diabetic chronic kidney disease: Secondary | ICD-10-CM | POA: Diagnosis not present

## 2020-05-31 DIAGNOSIS — Z978 Presence of other specified devices: Secondary | ICD-10-CM

## 2020-05-31 DIAGNOSIS — G319 Degenerative disease of nervous system, unspecified: Secondary | ICD-10-CM | POA: Diagnosis not present

## 2020-05-31 DIAGNOSIS — I5022 Chronic systolic (congestive) heart failure: Secondary | ICD-10-CM | POA: Diagnosis present

## 2020-05-31 DIAGNOSIS — E870 Hyperosmolality and hypernatremia: Secondary | ICD-10-CM | POA: Diagnosis not present

## 2020-05-31 DIAGNOSIS — E1142 Type 2 diabetes mellitus with diabetic polyneuropathy: Secondary | ICD-10-CM | POA: Diagnosis not present

## 2020-05-31 DIAGNOSIS — Z515 Encounter for palliative care: Secondary | ICD-10-CM

## 2020-05-31 DIAGNOSIS — N170 Acute kidney failure with tubular necrosis: Secondary | ICD-10-CM | POA: Diagnosis not present

## 2020-05-31 DIAGNOSIS — D696 Thrombocytopenia, unspecified: Secondary | ICD-10-CM | POA: Diagnosis not present

## 2020-05-31 DIAGNOSIS — S82851D Displaced trimalleolar fracture of right lower leg, subsequent encounter for closed fracture with routine healing: Secondary | ICD-10-CM

## 2020-05-31 DIAGNOSIS — Z993 Dependence on wheelchair: Secondary | ICD-10-CM

## 2020-05-31 DIAGNOSIS — I5020 Unspecified systolic (congestive) heart failure: Secondary | ICD-10-CM | POA: Diagnosis not present

## 2020-05-31 DIAGNOSIS — J9690 Respiratory failure, unspecified, unspecified whether with hypoxia or hypercapnia: Secondary | ICD-10-CM | POA: Diagnosis not present

## 2020-05-31 DIAGNOSIS — E1129 Type 2 diabetes mellitus with other diabetic kidney complication: Secondary | ICD-10-CM | POA: Diagnosis not present

## 2020-05-31 DIAGNOSIS — N184 Chronic kidney disease, stage 4 (severe): Secondary | ICD-10-CM | POA: Diagnosis not present

## 2020-05-31 DIAGNOSIS — I472 Ventricular tachycardia: Secondary | ICD-10-CM | POA: Diagnosis not present

## 2020-05-31 DIAGNOSIS — S82831A Other fracture of upper and lower end of right fibula, initial encounter for closed fracture: Secondary | ICD-10-CM | POA: Diagnosis not present

## 2020-05-31 DIAGNOSIS — Z66 Do not resuscitate: Secondary | ICD-10-CM | POA: Diagnosis not present

## 2020-05-31 DIAGNOSIS — Z811 Family history of alcohol abuse and dependence: Secondary | ICD-10-CM

## 2020-05-31 DIAGNOSIS — J690 Pneumonitis due to inhalation of food and vomit: Secondary | ICD-10-CM | POA: Diagnosis not present

## 2020-05-31 DIAGNOSIS — R569 Unspecified convulsions: Secondary | ICD-10-CM

## 2020-05-31 DIAGNOSIS — R0689 Other abnormalities of breathing: Secondary | ICD-10-CM | POA: Diagnosis not present

## 2020-05-31 DIAGNOSIS — J189 Pneumonia, unspecified organism: Secondary | ICD-10-CM

## 2020-05-31 DIAGNOSIS — I428 Other cardiomyopathies: Secondary | ICD-10-CM | POA: Diagnosis not present

## 2020-05-31 DIAGNOSIS — N189 Chronic kidney disease, unspecified: Secondary | ICD-10-CM | POA: Diagnosis not present

## 2020-05-31 DIAGNOSIS — M62838 Other muscle spasm: Secondary | ICD-10-CM | POA: Diagnosis present

## 2020-05-31 DIAGNOSIS — J9602 Acute respiratory failure with hypercapnia: Secondary | ICD-10-CM | POA: Diagnosis not present

## 2020-05-31 DIAGNOSIS — R55 Syncope and collapse: Secondary | ICD-10-CM | POA: Diagnosis present

## 2020-05-31 DIAGNOSIS — E1165 Type 2 diabetes mellitus with hyperglycemia: Secondary | ICD-10-CM | POA: Diagnosis not present

## 2020-05-31 DIAGNOSIS — G931 Anoxic brain damage, not elsewhere classified: Secondary | ICD-10-CM | POA: Diagnosis not present

## 2020-05-31 DIAGNOSIS — J158 Pneumonia due to other specified bacteria: Secondary | ICD-10-CM | POA: Diagnosis not present

## 2020-05-31 DIAGNOSIS — Z79899 Other long term (current) drug therapy: Secondary | ICD-10-CM

## 2020-05-31 DIAGNOSIS — Z992 Dependence on renal dialysis: Secondary | ICD-10-CM | POA: Diagnosis not present

## 2020-05-31 DIAGNOSIS — N1832 Chronic kidney disease, stage 3b: Secondary | ICD-10-CM | POA: Diagnosis not present

## 2020-05-31 DIAGNOSIS — G9341 Metabolic encephalopathy: Secondary | ICD-10-CM | POA: Diagnosis not present

## 2020-05-31 DIAGNOSIS — F1210 Cannabis abuse, uncomplicated: Secondary | ICD-10-CM | POA: Diagnosis present

## 2020-05-31 DIAGNOSIS — R7402 Elevation of levels of lactic acid dehydrogenase (LDH): Secondary | ICD-10-CM | POA: Diagnosis not present

## 2020-05-31 DIAGNOSIS — Z833 Family history of diabetes mellitus: Secondary | ICD-10-CM

## 2020-05-31 DIAGNOSIS — Z4502 Encounter for adjustment and management of automatic implantable cardiac defibrillator: Secondary | ICD-10-CM | POA: Diagnosis not present

## 2020-05-31 DIAGNOSIS — Z0189 Encounter for other specified special examinations: Secondary | ICD-10-CM

## 2020-05-31 DIAGNOSIS — J984 Other disorders of lung: Secondary | ICD-10-CM | POA: Diagnosis not present

## 2020-05-31 DIAGNOSIS — R7401 Elevation of levels of liver transaminase levels: Secondary | ICD-10-CM | POA: Diagnosis not present

## 2020-05-31 DIAGNOSIS — J9811 Atelectasis: Secondary | ICD-10-CM | POA: Diagnosis not present

## 2020-05-31 DIAGNOSIS — R609 Edema, unspecified: Secondary | ICD-10-CM | POA: Diagnosis not present

## 2020-05-31 DIAGNOSIS — J9601 Acute respiratory failure with hypoxia: Secondary | ICD-10-CM | POA: Diagnosis not present

## 2020-05-31 DIAGNOSIS — E872 Acidosis, unspecified: Secondary | ICD-10-CM | POA: Diagnosis present

## 2020-05-31 DIAGNOSIS — I2510 Atherosclerotic heart disease of native coronary artery without angina pectoris: Secondary | ICD-10-CM | POA: Diagnosis not present

## 2020-05-31 DIAGNOSIS — I509 Heart failure, unspecified: Secondary | ICD-10-CM | POA: Diagnosis not present

## 2020-05-31 DIAGNOSIS — I129 Hypertensive chronic kidney disease with stage 1 through stage 4 chronic kidney disease, or unspecified chronic kidney disease: Secondary | ICD-10-CM | POA: Diagnosis not present

## 2020-05-31 DIAGNOSIS — Z720 Tobacco use: Secondary | ICD-10-CM

## 2020-05-31 DIAGNOSIS — S82899A Other fracture of unspecified lower leg, initial encounter for closed fracture: Secondary | ICD-10-CM

## 2020-05-31 DIAGNOSIS — Z7982 Long term (current) use of aspirin: Secondary | ICD-10-CM

## 2020-05-31 DIAGNOSIS — I469 Cardiac arrest, cause unspecified: Secondary | ICD-10-CM | POA: Diagnosis not present

## 2020-05-31 DIAGNOSIS — K5790 Diverticulosis of intestine, part unspecified, without perforation or abscess without bleeding: Secondary | ICD-10-CM | POA: Diagnosis present

## 2020-05-31 DIAGNOSIS — Z452 Encounter for adjustment and management of vascular access device: Secondary | ICD-10-CM

## 2020-05-31 DIAGNOSIS — Z635 Disruption of family by separation and divorce: Secondary | ICD-10-CM

## 2020-05-31 DIAGNOSIS — Z950 Presence of cardiac pacemaker: Secondary | ICD-10-CM | POA: Diagnosis not present

## 2020-05-31 DIAGNOSIS — Z4682 Encounter for fitting and adjustment of non-vascular catheter: Secondary | ICD-10-CM | POA: Diagnosis not present

## 2020-05-31 DIAGNOSIS — J9600 Acute respiratory failure, unspecified whether with hypoxia or hypercapnia: Secondary | ICD-10-CM

## 2020-05-31 DIAGNOSIS — R0603 Acute respiratory distress: Secondary | ICD-10-CM | POA: Diagnosis not present

## 2020-05-31 DIAGNOSIS — D649 Anemia, unspecified: Secondary | ICD-10-CM | POA: Diagnosis not present

## 2020-05-31 DIAGNOSIS — I480 Paroxysmal atrial fibrillation: Secondary | ICD-10-CM | POA: Diagnosis not present

## 2020-05-31 DIAGNOSIS — Z801 Family history of malignant neoplasm of trachea, bronchus and lung: Secondary | ICD-10-CM

## 2020-05-31 DIAGNOSIS — Z87891 Personal history of nicotine dependence: Secondary | ICD-10-CM | POA: Diagnosis not present

## 2020-05-31 DIAGNOSIS — Z794 Long term (current) use of insulin: Secondary | ICD-10-CM

## 2020-05-31 DIAGNOSIS — Z880 Allergy status to penicillin: Secondary | ICD-10-CM

## 2020-05-31 DIAGNOSIS — Z8249 Family history of ischemic heart disease and other diseases of the circulatory system: Secondary | ICD-10-CM

## 2020-05-31 DIAGNOSIS — R069 Unspecified abnormalities of breathing: Secondary | ICD-10-CM

## 2020-05-31 DIAGNOSIS — E876 Hypokalemia: Secondary | ICD-10-CM | POA: Diagnosis not present

## 2020-05-31 DIAGNOSIS — Z7901 Long term (current) use of anticoagulants: Secondary | ICD-10-CM

## 2020-05-31 DIAGNOSIS — Z882 Allergy status to sulfonamides status: Secondary | ICD-10-CM

## 2020-05-31 LAB — CBC WITH DIFFERENTIAL/PLATELET
Abs Immature Granulocytes: 0.16 10*3/uL — ABNORMAL HIGH (ref 0.00–0.07)
Basophils Absolute: 0 10*3/uL (ref 0.0–0.1)
Basophils Relative: 0 %
Eosinophils Absolute: 0 10*3/uL (ref 0.0–0.5)
Eosinophils Relative: 0 %
HCT: 32.2 % — ABNORMAL LOW (ref 39.0–52.0)
Hemoglobin: 10.4 g/dL — ABNORMAL LOW (ref 13.0–17.0)
Immature Granulocytes: 1 %
Lymphocytes Relative: 4 %
Lymphs Abs: 0.6 10*3/uL — ABNORMAL LOW (ref 0.7–4.0)
MCH: 26.5 pg (ref 26.0–34.0)
MCHC: 32.3 g/dL (ref 30.0–36.0)
MCV: 82.1 fL (ref 80.0–100.0)
Monocytes Absolute: 1.1 10*3/uL — ABNORMAL HIGH (ref 0.1–1.0)
Monocytes Relative: 7 %
Neutro Abs: 13.3 10*3/uL — ABNORMAL HIGH (ref 1.7–7.7)
Neutrophils Relative %: 88 %
Platelets: 195 10*3/uL (ref 150–400)
RBC: 3.92 MIL/uL — ABNORMAL LOW (ref 4.22–5.81)
RDW: 14.8 % (ref 11.5–15.5)
WBC: 15.1 10*3/uL — ABNORMAL HIGH (ref 4.0–10.5)
nRBC: 0 % (ref 0.0–0.2)

## 2020-05-31 LAB — CBC
HCT: 29.9 % — ABNORMAL LOW (ref 39.0–52.0)
Hemoglobin: 9.5 g/dL — ABNORMAL LOW (ref 13.0–17.0)
MCH: 25.9 pg — ABNORMAL LOW (ref 26.0–34.0)
MCHC: 31.8 g/dL (ref 30.0–36.0)
MCV: 81.5 fL (ref 80.0–100.0)
Platelets: 173 10*3/uL (ref 150–400)
RBC: 3.67 MIL/uL — ABNORMAL LOW (ref 4.22–5.81)
RDW: 14.9 % (ref 11.5–15.5)
WBC: 14.7 10*3/uL — ABNORMAL HIGH (ref 4.0–10.5)
nRBC: 0 % (ref 0.0–0.2)

## 2020-05-31 LAB — POCT I-STAT 7, (LYTES, BLD GAS, ICA,H+H)
Acid-base deficit: 2 mmol/L (ref 0.0–2.0)
Bicarbonate: 24.1 mmol/L (ref 20.0–28.0)
Calcium, Ion: 1.35 mmol/L (ref 1.15–1.40)
HCT: 27 % — ABNORMAL LOW (ref 39.0–52.0)
Hemoglobin: 9.2 g/dL — ABNORMAL LOW (ref 13.0–17.0)
O2 Saturation: 98 %
Patient temperature: 98.7
Potassium: 4.3 mmol/L (ref 3.5–5.1)
Sodium: 138 mmol/L (ref 135–145)
TCO2: 25 mmol/L (ref 22–32)
pCO2 arterial: 43.9 mmHg (ref 32.0–48.0)
pH, Arterial: 7.347 — ABNORMAL LOW (ref 7.350–7.450)
pO2, Arterial: 113 mmHg — ABNORMAL HIGH (ref 83.0–108.0)

## 2020-05-31 LAB — COMPREHENSIVE METABOLIC PANEL
ALT: 107 U/L — ABNORMAL HIGH (ref 0–44)
AST: 122 U/L — ABNORMAL HIGH (ref 15–41)
Albumin: 2.2 g/dL — ABNORMAL LOW (ref 3.5–5.0)
Alkaline Phosphatase: 118 U/L (ref 38–126)
Anion gap: 10 (ref 5–15)
BUN: 45 mg/dL — ABNORMAL HIGH (ref 8–23)
CO2: 22 mmol/L (ref 22–32)
Calcium: 9.2 mg/dL (ref 8.9–10.3)
Chloride: 106 mmol/L (ref 98–111)
Creatinine, Ser: 2.81 mg/dL — ABNORMAL HIGH (ref 0.61–1.24)
GFR, Estimated: 24 mL/min — ABNORMAL LOW (ref >60)
Glucose, Bld: 202 mg/dL — ABNORMAL HIGH (ref 70–99)
Potassium: 4.2 mmol/L (ref 3.5–5.1)
Sodium: 138 mmol/L (ref 135–145)
Total Bilirubin: 0.7 mg/dL (ref 0.3–1.2)
Total Protein: 5.2 g/dL — ABNORMAL LOW (ref 6.5–8.1)

## 2020-05-31 LAB — BASIC METABOLIC PANEL
Anion gap: 7 (ref 5–15)
BUN: 46 mg/dL — ABNORMAL HIGH (ref 8–23)
CO2: 24 mmol/L (ref 22–32)
Calcium: 8.9 mg/dL (ref 8.9–10.3)
Chloride: 106 mmol/L (ref 98–111)
Creatinine, Ser: 2.96 mg/dL — ABNORMAL HIGH (ref 0.61–1.24)
GFR, Estimated: 23 mL/min — ABNORMAL LOW (ref >60)
Glucose, Bld: 194 mg/dL — ABNORMAL HIGH (ref 70–99)
Potassium: 4.5 mmol/L (ref 3.5–5.1)
Sodium: 137 mmol/L (ref 135–145)

## 2020-05-31 LAB — LACTIC ACID, PLASMA
Lactic Acid, Venous: 3.1 mmol/L (ref 0.5–1.9)
Lactic Acid, Venous: 2.8 mmol/L (ref 0.5–1.9)
Lactic Acid, Venous: 2.2 mmol/L (ref 0.5–1.9)
Lactic Acid, Venous: 2.3 mmol/L (ref 0.5–1.9)

## 2020-05-31 LAB — URINALYSIS, ROUTINE W REFLEX MICROSCOPIC
Bacteria, UA: NONE SEEN
Bilirubin Urine: NEGATIVE
Glucose, UA: >=500 mg/dL — AB
Ketones, ur: NEGATIVE mg/dL
Leukocytes,Ua: NEGATIVE
Nitrite: NEGATIVE
Protein, ur: >=300 mg/dL — AB
Specific Gravity, Urine: 1.021 (ref 1.005–1.030)
pH: 5 (ref 5.0–8.0)

## 2020-05-31 LAB — GLUCOSE, CAPILLARY
Glucose-Capillary: 149 mg/dL — ABNORMAL HIGH (ref 70–99)
Glucose-Capillary: 163 mg/dL — ABNORMAL HIGH (ref 70–99)
Glucose-Capillary: 175 mg/dL — ABNORMAL HIGH (ref 70–99)
Glucose-Capillary: 196 mg/dL — ABNORMAL HIGH (ref 70–99)
Glucose-Capillary: 200 mg/dL — ABNORMAL HIGH (ref 70–99)
Glucose-Capillary: 200 mg/dL — ABNORMAL HIGH (ref 70–99)
Glucose-Capillary: 216 mg/dL — ABNORMAL HIGH (ref 70–99)

## 2020-05-31 LAB — ECHOCARDIOGRAM COMPLETE
Area-P 1/2: 4.68 cm2
Height: 69 in
S' Lateral: 5 cm
Single Plane A4C EF: 45.7 %
Weight: 3393.32 oz

## 2020-05-31 LAB — MRSA PCR SCREENING: MRSA by PCR: NEGATIVE

## 2020-05-31 LAB — PHOSPHORUS: Phosphorus: 4.8 mg/dL — ABNORMAL HIGH (ref 2.5–4.6)

## 2020-05-31 LAB — TROPONIN I (HIGH SENSITIVITY)
Troponin I (High Sensitivity): 801 ng/L (ref <18)
Troponin I (High Sensitivity): 700 ng/L (ref <18)
Troponin I (High Sensitivity): 551 ng/L (ref <18)

## 2020-05-31 LAB — BRAIN NATRIURETIC PEPTIDE: B Natriuretic Peptide: 1060 pg/mL — ABNORMAL HIGH (ref 0.0–100.0)

## 2020-05-31 LAB — CORTISOL: Cortisol, Plasma: 25.3 ug/dL

## 2020-05-31 LAB — MAGNESIUM
Magnesium: 2.1 mg/dL (ref 1.7–2.4)
Magnesium: 2.1 mg/dL (ref 1.7–2.4)

## 2020-05-31 LAB — PROCALCITONIN: Procalcitonin: 3.38 ng/mL

## 2020-05-31 LAB — AMYLASE: Amylase: 152 U/L — ABNORMAL HIGH (ref 28–100)

## 2020-05-31 LAB — LIPASE, BLOOD: Lipase: 28 U/L (ref 11–51)

## 2020-05-31 MED ORDER — MIDAZOLAM HCL 2 MG/2ML IJ SOLN: 2.0000 mg | INTRAMUSCULAR | Status: DC | PRN

## 2020-05-31 MED ORDER — INSULIN ASPART 100 UNIT/ML ~~LOC~~ SOLN
0.0000 [IU] | SUBCUTANEOUS | Status: DC
  Administered 2020-05-31 (×3): 4 [IU] via SUBCUTANEOUS
  Administered 2020-05-31: 3 [IU] via SUBCUTANEOUS
  Administered 2020-05-31 (×2): 4 [IU] via SUBCUTANEOUS
  Administered 2020-06-01: 7 [IU] via SUBCUTANEOUS
  Administered 2020-06-01 – 2020-06-02 (×8): 4 [IU] via SUBCUTANEOUS
  Administered 2020-06-02: 7 [IU] via SUBCUTANEOUS
  Administered 2020-06-02: 4 [IU] via SUBCUTANEOUS
  Administered 2020-06-03: 3 [IU] via SUBCUTANEOUS
  Administered 2020-06-03 (×2): 4 [IU] via SUBCUTANEOUS
  Administered 2020-06-03 (×2): 7 [IU] via SUBCUTANEOUS
  Administered 2020-06-03 (×2): 4 [IU] via SUBCUTANEOUS
  Administered 2020-06-04: 3 [IU] via SUBCUTANEOUS
  Administered 2020-06-04: 7 [IU] via SUBCUTANEOUS
  Administered 2020-06-04: 3 [IU] via SUBCUTANEOUS
  Administered 2020-06-04 – 2020-06-05 (×3): 4 [IU] via SUBCUTANEOUS
  Administered 2020-06-05 (×2): 3 [IU] via SUBCUTANEOUS
  Administered 2020-06-06: 4 [IU] via SUBCUTANEOUS
  Administered 2020-06-06: 7 [IU] via SUBCUTANEOUS
  Administered 2020-06-06: 3 [IU] via SUBCUTANEOUS
  Administered 2020-06-06: 7 [IU] via SUBCUTANEOUS
  Administered 2020-06-07 (×2): 3 [IU] via SUBCUTANEOUS
  Administered 2020-06-07: 4 [IU] via SUBCUTANEOUS
  Administered 2020-06-07: 3 [IU] via SUBCUTANEOUS
  Administered 2020-06-08 – 2020-06-09 (×6): 4 [IU] via SUBCUTANEOUS
  Administered 2020-06-09: 7 [IU] via SUBCUTANEOUS
  Administered 2020-06-09 – 2020-06-11 (×11): 4 [IU] via SUBCUTANEOUS
  Administered 2020-06-11: 3 [IU] via SUBCUTANEOUS
  Administered 2020-06-11: 4 [IU] via SUBCUTANEOUS
  Administered 2020-06-11: 3 [IU] via SUBCUTANEOUS
  Administered 2020-06-12 (×2): 4 [IU] via SUBCUTANEOUS

## 2020-05-31 MED ORDER — PANTOPRAZOLE SODIUM 40 MG IV SOLR
40.0000 mg | Freq: Every day | INTRAVENOUS | Status: DC
  Administered 2020-05-31 – 2020-06-05 (×6): 40 mg via INTRAVENOUS
  Filled 2020-05-31 (×6): qty 40

## 2020-05-31 MED ORDER — MIDAZOLAM 50MG/50ML (1MG/ML) PREMIX INFUSION
0.0000 mg/h | INTRAVENOUS | Status: DC
  Administered 2020-05-31: 2 mg/h via INTRAVENOUS
  Administered 2020-05-31: 6 mg/h via INTRAVENOUS
  Administered 2020-05-31: 8 mg/h via INTRAVENOUS
  Administered 2020-06-01: 9 mg/h via INTRAVENOUS
  Administered 2020-06-01 (×4): 8 mg/h via INTRAVENOUS
  Administered 2020-06-02: 9 mg/h via INTRAVENOUS
  Filled 2020-05-31 (×10): qty 50

## 2020-05-31 MED ORDER — RIVAROXABAN 2.5 MG PO TABS
2.5000 mg | ORAL_TABLET | Freq: Two times a day (BID) | ORAL | Status: DC
  Administered 2020-05-31: 2.5 mg
  Filled 2020-05-31: qty 1

## 2020-05-31 MED ORDER — ASPIRIN 300 MG RE SUPP: 300.0000 mg | RECTAL | Status: AC

## 2020-05-31 MED ORDER — CHLORHEXIDINE GLUCONATE 0.12% ORAL RINSE (MEDLINE KIT)
15.0000 mL | Freq: Two times a day (BID) | OROMUCOSAL | Status: DC
  Administered 2020-05-31 – 2020-06-07 (×14): 15 mL via OROMUCOSAL

## 2020-05-31 MED ORDER — ORAL CARE MOUTH RINSE
15.0000 mL | OROMUCOSAL | Status: DC
  Administered 2020-05-31 – 2020-06-05 (×54): 15 mL via OROMUCOSAL

## 2020-05-31 MED ORDER — PROSOURCE TF PO LIQD
45.0000 mL | Freq: Two times a day (BID) | ORAL | Status: DC
  Administered 2020-05-31 – 2020-06-01 (×3): 45 mL
  Filled 2020-05-31 (×3): qty 45

## 2020-05-31 MED ORDER — DOCUSATE SODIUM 50 MG/5ML PO LIQD: 100.0000 mg | Freq: Two times a day (BID) | ORAL | Status: DC | PRN

## 2020-05-31 MED ORDER — LEVETIRACETAM IN NACL 1000 MG/100ML IV SOLN
1000.0000 mg | Freq: Two times a day (BID) | INTRAVENOUS | Status: DC
  Administered 2020-05-31: 1000 mg via INTRAVENOUS
  Filled 2020-05-31 (×2): qty 100

## 2020-05-31 MED ORDER — ONDANSETRON HCL 4 MG/2ML IJ SOLN: 4.0000 mg | Freq: Four times a day (QID) | INTRAMUSCULAR | Status: DC | PRN

## 2020-05-31 MED ORDER — POLYETHYLENE GLYCOL 3350 17 G PO PACK
17.0000 g | PACK | Freq: Every day | ORAL | Status: DC
  Administered 2020-05-31 – 2020-06-06 (×6): 17 g
  Filled 2020-05-31 (×8): qty 1

## 2020-05-31 MED ORDER — FENTANYL CITRATE (PF) 100 MCG/2ML IJ SOLN
50.0000 ug | INTRAMUSCULAR | Status: AC | PRN
  Administered 2020-06-04 (×3): 50 ug via INTRAVENOUS
  Filled 2020-05-31: qty 2

## 2020-05-31 MED ORDER — HEPARIN SODIUM (PORCINE) 5000 UNIT/ML IJ SOLN: 5000.0000 [IU] | Freq: Three times a day (TID) | INTRAMUSCULAR | Status: DC

## 2020-05-31 MED ORDER — INSULIN GLARGINE 100 UNIT/ML ~~LOC~~ SOLN
10.0000 [IU] | Freq: Two times a day (BID) | SUBCUTANEOUS | Status: DC
  Administered 2020-05-31 – 2020-06-01 (×3): 10 [IU] via SUBCUTANEOUS
  Filled 2020-05-31 (×4): qty 0.1

## 2020-05-31 MED ORDER — BUDESONIDE 0.5 MG/2ML IN SUSP
0.5000 mg | Freq: Two times a day (BID) | RESPIRATORY_TRACT | Status: DC
  Administered 2020-05-31 – 2020-06-12 (×25): 0.5 mg via RESPIRATORY_TRACT
  Filled 2020-05-31 (×25): qty 2

## 2020-05-31 MED ORDER — IPRATROPIUM-ALBUTEROL 0.5-2.5 (3) MG/3ML IN SOLN
3.0000 mL | Freq: Four times a day (QID) | RESPIRATORY_TRACT | Status: DC
  Administered 2020-05-31 – 2020-06-07 (×29): 3 mL via RESPIRATORY_TRACT
  Filled 2020-05-31 (×17): qty 3
  Filled 2020-05-31: qty 9
  Filled 2020-05-31 (×11): qty 3

## 2020-05-31 MED ORDER — CHLORHEXIDINE GLUCONATE CLOTH 2 % EX PADS
6.0000 | MEDICATED_PAD | Freq: Every day | CUTANEOUS | Status: DC
  Administered 2020-05-31 – 2020-06-07 (×9): 6 via TOPICAL

## 2020-05-31 MED ORDER — RIVAROXABAN 2.5 MG PO TABS
2.5000 mg | ORAL_TABLET | Freq: Two times a day (BID) | ORAL | Status: DC
  Filled 2020-05-31: qty 1

## 2020-05-31 MED ORDER — ALBUTEROL SULFATE (2.5 MG/3ML) 0.083% IN NEBU: 2.5000 mg | INHALATION_SOLUTION | Freq: Four times a day (QID) | RESPIRATORY_TRACT | Status: DC

## 2020-05-31 MED ORDER — POLYETHYLENE GLYCOL 3350 17 G PO PACK: 17.0000 g | PACK | Freq: Every day | ORAL | Status: DC | PRN

## 2020-05-31 MED ORDER — VITAL HIGH PROTEIN PO LIQD
1000.0000 mL | ORAL | Status: DC
  Administered 2020-05-31: 1000 mL
  Filled 2020-05-31: qty 1000

## 2020-05-31 MED ORDER — DOCUSATE SODIUM 50 MG/5ML PO LIQD
100.0000 mg | Freq: Two times a day (BID) | ORAL | Status: DC
  Administered 2020-05-31 – 2020-06-06 (×13): 100 mg
  Filled 2020-05-31 (×13): qty 10

## 2020-05-31 MED ORDER — CARVEDILOL 3.125 MG PO TABS
3.1250 mg | ORAL_TABLET | Freq: Two times a day (BID) | ORAL | Status: DC
  Filled 2020-05-31 (×2): qty 1

## 2020-05-31 MED ORDER — ROSUVASTATIN CALCIUM 5 MG PO TABS
10.0000 mg | ORAL_TABLET | Freq: Every day | ORAL | Status: DC
  Administered 2020-05-31 – 2020-06-11 (×12): 10 mg
  Filled 2020-05-31 (×12): qty 2

## 2020-05-31 MED ORDER — MIDAZOLAM BOLUS VIA INFUSION
0.0000 mg | INTRAVENOUS | Status: DC | PRN
  Administered 2020-05-31 (×4): 2 mg via INTRAVENOUS
  Administered 2020-05-31: 3 mg via INTRAVENOUS
  Administered 2020-06-01 – 2020-06-02 (×4): 2 mg via INTRAVENOUS
  Filled 2020-05-31: qty 5

## 2020-05-31 MED ORDER — ENOXAPARIN SODIUM 30 MG/0.3ML ~~LOC~~ SOLN
30.0000 mg | SUBCUTANEOUS | Status: DC
  Administered 2020-05-31 – 2020-06-07 (×8): 30 mg via SUBCUTANEOUS
  Filled 2020-05-31 (×9): qty 0.3

## 2020-05-31 MED ORDER — ASPIRIN 81 MG PO CHEW
324.0000 mg | CHEWABLE_TABLET | ORAL | Status: AC
  Administered 2020-05-31: 324 mg via ORAL
  Filled 2020-05-31: qty 4

## 2020-05-31 MED ORDER — ALBUTEROL SULFATE (2.5 MG/3ML) 0.083% IN NEBU
2.5000 mg | INHALATION_SOLUTION | RESPIRATORY_TRACT | Status: DC | PRN
  Administered 2020-05-31: 2.5 mg via RESPIRATORY_TRACT
  Filled 2020-05-31: qty 3

## 2020-05-31 MED ORDER — ASPIRIN 81 MG PO CHEW
81.0000 mg | CHEWABLE_TABLET | Freq: Every day | ORAL | Status: DC
  Administered 2020-05-31 – 2020-06-11 (×12): 81 mg
  Filled 2020-05-31 (×12): qty 1

## 2020-05-31 MED ORDER — FENTANYL CITRATE (PF) 100 MCG/2ML IJ SOLN
50.0000 ug | INTRAMUSCULAR | Status: DC | PRN
Start: 2020-05-31 — End: 2020-06-06
  Administered 2020-05-31 – 2020-06-01 (×6): 100 ug via INTRAVENOUS
  Administered 2020-06-01: 200 ug via INTRAVENOUS
  Administered 2020-06-01 – 2020-06-02 (×6): 100 ug via INTRAVENOUS
  Administered 2020-06-03 (×5): 50 ug via INTRAVENOUS
  Administered 2020-06-03: 100 ug via INTRAVENOUS
  Administered 2020-06-04: 50 ug via INTRAVENOUS
  Administered 2020-06-04: 100 ug via INTRAVENOUS
  Administered 2020-06-04: 50 ug via INTRAVENOUS
  Filled 2020-05-31 (×8): qty 2
  Filled 2020-05-31: qty 4
  Filled 2020-05-31 (×3): qty 2

## 2020-05-31 MED ORDER — APIXABAN 2.5 MG PO TABS: 2.5000 mg | ORAL_TABLET | Freq: Two times a day (BID) | ORAL | Status: DC

## 2020-05-31 MED ORDER — FUROSEMIDE 10 MG/ML IJ SOLN
80.0000 mg | Freq: Four times a day (QID) | INTRAMUSCULAR | Status: AC
  Administered 2020-05-31 – 2020-06-01 (×2): 80 mg via INTRAVENOUS
  Filled 2020-05-31 (×2): qty 8

## 2020-05-31 NOTE — Procedures (Signed)
  PROCEDURE: EEG routine 26 minutes study with video time locked  DATES OF TEST: 06/06/2020   REASON FOR TEST: Seizures  AED/Sedative MEDICATIONS: Versed  TECHNIQUE: This is an 18 channel digital EEG recording using the standard international 10/20 system of electrode placement with one channel EKG recording. Pt was comatose during this recording.  FINDINGS:  Background rhythm: symmetric diffuse 1-3 Hz with myogenic artifact with some 5 hz Amplitude :low (<20 microvolts) Continuity: continuous Breach effect:  NO Variability: NO Reactivity: NO Rhythmic delta activity:NO Periodic discharges: NO Sporadic epileptiform discharges: NO Electrographic/electroclinical seizures:NO  Limited EKG reveals no abnormalities.    IMPRESSION:  This is an abnormal study due to - 1. Severe Generalized slowing is a non-specific finding consistent with a generalized disturbance of cerebral functioning including toxic, metabolic, or structural abnormalities that are multi-focal or diffuse 2. No definite epileptiform discharges or electrographic seizures noted.

## 2020-05-31 NOTE — H&P (Addendum)
NAME:  Brad Mendoza, MRN:  [id number], DOB:  05/05/1955, LOS: 0 ADMISSION DATE:  06/20/2020, CONSULTATION DATE: 4/10 REFERRING MD: Dr. Otelia Limes EDP [facility], CHIEF COMPLAINT: Cardiac arrest  History of Present Illness:  Patient is encephalopathic and/or intubated. Therefore history has been obtained from chart review.  65 year old male with past medical history as below, which is significant for CAD, CKD, DM, hypertension, ICD, polysubstance abuse. Recent history of RLE fracture after a fall and has been wheelchair bound. He presented to [facility] emergency department on 4/9 with complaints of seizure-like activity and decreased level of consciousness.  He was last known well around 4:30 PM when family found him around 5 PM he has slurred speech.  Upon EMS arrival slurred speech persisted.  As the patient was being transferred into the ambulance he developed agonal respirations and suffered a generalized tonic-clonic seizure.  This was abated with Versed.  He did not wake up from his postictal phase was bagged in route to the ED.    Upon arrival to the ED he was found to be pulseless and underwent CPR. ROSC after 3 - 4 rounds, non-shockable rhythm. Epi x 3, Amiodarone, Calcium Copious projectile Vomiting after ROSC Agonal respiration OG dropped before, suctioned, then uncomplicated intubation Non-sustained VT afterwards, now in sinus tach. Initial BPs with systolic in the 623J.  He had a CT of the head which was normal.  Telemetry neurology was involved and recommended a CTA, however, due to his elevated serum creatinine he was unable.  [facility] did not have MRI capabilities and the patient was referred for transfer to [facility].   Pertinent  Medical History   has a past medical history of CAD (coronary artery disease), Chronic back pain, CKD (chronic kidney disease), stage IV ([place]), Diverticulosis, Essential hypertension, GERD (gastroesophageal reflux disease), Hyperlipidemia, ICD  (implantable cardioverter-defibrillator) in place, Onychomycosis, Polysubstance abuse ([place]), Secondary cardiomyopathy ([place]), and Type 2 diabetes mellitus ([place]).   Significant Hospital Events: Including procedures, antibiotic start and stop dates in addition to other pertinent events   . 4/9 [place], cardiac arrest . 4/10 [facility] to [facility] care.   Interim History / Subjective:    Objective   Blood pressure 133/83, pulse 64, temperature 98.7 F (37.1 C), temperature source Oral, resp. rate 20, height _0  (1.753 m), weight 96.2 kg, SpO2 99 %.    Vent Mode: PRVC FiO2 (%):  [100 %] 100 % Set Rate:  [18 bmp] 18 bmp Vt Set:  [560 mL] 560 mL PEEP:  [5 cmH20] 5 cmH20 Plateau Pressure:  [18 cmH20] 18 cmH20  No intake or output data in the 24 hours ending 06/13/2020 0222 Filed Weights   05/28/2020 0200  Weight: 96.2 kg    Examination: General: Middle aged male on vent. Normal body habitus HENT: [place]/AT, PERRL, unable to appreciate JVD Lungs: Rales, Rhonchi throughout  Cardiovascular: RRR, no MRG Abdomen: Soft, non-tender, non-distended Extremities: Cast RLE. Distal pulse intact, foot warm. Unable to assess capillary refill.  Neuro: Sedated. Opens eyes to pain. Gag+, breathes over vent.   Labs/imaging that I havepersonally reviewed  (right click and "Reselect all SmartList Selections" daily)  Glucose 800 K 6 CT head wnl UDS +TCH COVID neg Anion gap 14 > 9  Resolved Hospital Problem list     Assessment & Plan:   Cardiac arrest: etiology unclear. With slurred speech and seizure like activity there is concern for CVA. He also has cardiac history and concern for wall motion  abnormality on pocus.  - MRI/MRA brain - Echocardiogram - EEG - Trend troponin - EKG - Carotid doppler  HHS: IDDM: poorly controlled per wife.  - discontinue insulin infusion - transition to SSI - Will decide on IVF after labs result  Seizure: pre arrest.  - Keppra given in ED, continue - EEG  pending  Acute encephalopathy: anoxic injury vs CVA, vs postictal. Could also be due to metabolic derangements.  - brain imaging pending as above - Admission labs pending.   Acute hypoxemic respiratory failure: poor airway protection post code.  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 CAD s/p DES to RCA 02/2018 - holding home medications - Echo pending - ICU hemodynamic monitoring.  - On Xarelto BID, hold for now pending neuroimaging. If negative will need to start heparin infusion.   AKI on CKD - Check BMP - Careful with volume, seems volume up on exam.   Polysubstance abuse history per EMR: UDS only positive for THC.  - Watch for withdrawal symptoms.    Best practice (right click and "Reselect all SmartList Selections" daily)  Diet:  NPO Pain/Anxiety/Delirium protocol (if indicated): Yes (RASS goal 0) VAP protocol (if indicated): Yes DVT prophylaxis: Subcutaneous Heparin GI prophylaxis: PPI Glucose control:  SSI Yes Central venous access:  N/A Arterial line:  N/A Foley:  N/A Mobility:  bed rest  PT consulted: N/A Last date of multidisciplinary goals of care discussion _0  Code Status:  full code Disposition: ICU  Labs   CBC: No results for input(s): WBC, NEUTROABS, HGB, HCT, MCV, PLT in the last 168 hours.  Basic Metabolic Panel: No results for input(s): NA, K, CL, CO2, GLUCOSE, BUN, CREATININE, CALCIUM, MG, PHOS in the last 168 hours. GFR: CrCl cannot be calculated (Patient's most recent lab result is older than the maximum 21 days allowed.). No results for input(s): PROCALCITON, WBC, LATICACIDVEN in the last 168 hours.  Liver Function Tests: No results for input(s): AST, ALT, ALKPHOS, BILITOT, PROT, ALBUMIN in the last 168 hours. No results for input(s): LIPASE, AMYLASE in the last 168 hours. No results  for input(s): AMMONIA in the last 168 hours.  ABG    Component Value Date/Time   PHART 7.441 03/19/2018 1216   PCO2ART 34.6 03/19/2018 1216   PO2ART 70.0 (L) 03/19/2018 1216   HCO3 26.0 03/19/2018 1220   HCO3 23.5 03/19/2018 1220   TCO2 27 03/19/2018 1220   TCO2 25 03/19/2018 1220   ACIDBASEDEF 1.0 03/19/2018 1220   O2SAT 62.0 03/19/2018 1220   O2SAT 65.0 03/19/2018 1220     Coagulation Profile: No results for input(s): INR, PROTIME in the last 168 hours.  Cardiac Enzymes: No results for input(s): CKTOTAL, CKMB, CKMBINDEX, TROPONINI in the last 168 hours.  HbA1C: HB A1C (BAYER DCA - WAIVED)  Date/Time Value Ref Range Status  12/09/2019 01:10 PM 7.8 (H) <7.0 % Final    Comment:                                          Diabetic Adult            <7.0  Healthy Adult        4.3 - 5.7                                                           (DCCT/NGSP) American Diabetes Association's Summary of Glycemic Recommendations for Adults with Diabetes: Hemoglobin A1c <7.0%. More stringent glycemic goals (A1c <6.0%) may further reduce complications at the cost of increased risk of hypoglycemia.   09/03/2019 12:51 PM >14.0 (H) <7.0 % Final    Comment:                                          Diabetic Adult            <7.0                                       Healthy Adult        4.3 - 5.7                                                           (DCCT/NGSP) American Diabetes Association's Summary of Glycemic Recommendations for Adults with Diabetes: Hemoglobin A1c <7.0%. More stringent glycemic goals (A1c <6.0%) may further reduce complications at the cost of increased risk of hypoglycemia.    Hgb A1c MFr Bld  Date/Time Value Ref Range Status  02/01/2020 06:48 PM 8.1 (H) 4.8 - 5.6 % Final    Comment:    (NOTE) Pre diabetes:          5.7%-6.4%  Diabetes:              >6.4%  Glycemic control for   <7.0% adults with diabetes      CBG: Recent Labs  Lab 06/01/2020 0158  GLUCAP 216*    Review of Systems:   Patient is encephalopathic and/or intubated. Therefore history has been obtained from chart review.   Past Medical History:  He,  has a past medical history of CAD (coronary artery disease), Chronic back pain, CKD (chronic kidney disease), stage IV ([place]), Diverticulosis, Essential hypertension, GERD (gastroesophageal reflux disease), Hyperlipidemia, ICD (implantable cardioverter-defibrillator) in place, Onychomycosis, Polysubstance abuse ([place]), Secondary cardiomyopathy ([place]), and Type 2 diabetes mellitus ([place]).   Surgical History:   Past Surgical History:  Procedure Laterality Date  . APPENDECTOMY    . CARDIAC CATHETERIZATION  2005   4 frech cath  . COLONOSCOPY N/A 08/01/2013   Procedure: COLONOSCOPY;  Surgeon: Rogene Houston, MD;  [facility];  Service: Endoscopy;  Laterality: N/A;  rescheduled to [place] notified pt  . CORONARY STENT INTERVENTION N/A 03/19/2018   Procedure: CORONARY STENT INTERVENTION;  Surgeon: Leonie Man, MD;  Location: [place] [facility];  Service: Cardiovascular;  Laterality: N/A;  . ESOPHAGOGASTRODUODENOSCOPY N/A 04/23/2015   Procedure: ESOPHAGOGASTRODUODENOSCOPY (EGD);  Surgeon: Rogene Houston, MD;  [facility];  Service: Endoscopy;  Laterality: N/A;  1:25  . RIGHT/LEFT HEART CATH  AND CORONARY ANGIOGRAPHY N/A 06/03/2016   Procedure: Right/Left Heart Cath and Coronary Angiography;  Surgeon: Belva Crome, MD;  Location: [place] [facility];  Service: Cardiovascular;  Laterality: N/A;  . RIGHT/LEFT HEART CATH AND CORONARY ANGIOGRAPHY N/A 03/19/2018   Procedure: RIGHT/LEFT HEART CATH AND CORONARY ANGIOGRAPHY;  Surgeon: Jolaine Artist, MD;  Location: [place] [facility];  Service: Cardiovascular;  Laterality: N/A;  . SUBQ ICD IMPLANT N/A 05/27/2019   Procedure: SUBQ ICD IMPLANT;  Surgeon: Evans Lance, MD;  Location: [place] [facility];  Service:  Cardiovascular;  Laterality: N/A;     Social History:   reports that he has never smoked. His smokeless tobacco use includes snuff. He reports current alcohol use of about 1.0 standard drink of alcohol per week. He reports current drug use. Drug: Marijuana.   Family History:  His family history includes Alcohol abuse in his brother; Diabetes in his father; Hypertension in his father; Lung cancer in his mother. There is no history of Colon cancer.   Allergies Allergies  Allergen Reactions  . Sulfa Antibiotics Shortness Of Breath  . Penicillins Rash and Other (See Comments)    Has patient had a PCN reaction causing immediate rash, facial/tongue/throat swelling, SOB or lightheadedness with hypotension: No Has patient had a PCN reaction causing severe rash involving mucus membranes or skin necrosis: No Has patient had a PCN reaction that required hospitalization No Has patient had a PCN reaction occurring within the last 10 years: No If all of the above answers are "NO", then may proceed with Cephalosporin use.      Home Medications  Prior to Admission medications   Medication Sig Start Date End Date Taking? Authorizing Provider  aspirin EC 81 MG tablet Take 81 mg by mouth daily.    [provider]  blood glucose meter kit and supplies KIT Dispense based on patient and insurance preference. Test four times daily as directed. (FOR ICD-10 : E11.9 03/21/19   [name], MD  Blood Glucose Monitoring Suppl (ONETOUCH VERIO) w/Device KIT 1 Device by Does not apply route 2 (two) times daily. 04/01/19   Loman Brooklyn, FNP  Capsaicin (ICY HOT ARTHRITIS THERAPY EX) Apply 1 application topically 2 (two) times daily as needed (back pain).     [provider]  carvedilol (COREG) 6.25 MG tablet TAKE 1 AND 1/2 TABLET BY MOUTH TWICE DAILY. 04/22/20   Larey Dresser, MD  Cholecalciferol (VITAMIN D3) 25 MCG (1000 UT) CAPS Take 2,000 Units by mouth daily.    [provider]   Continuous Blood Gluc Sensor (FREESTYLE LIBRE 2 SENSOR) MISC Use to test blood sugar up to 6 times daily as directed. E11.9 09/23/19   [name], MD  fluticasone (FLONASE) 50 MCG/ACT nasal spray INSTILL 2 SPRAYS INTO EACH NOSTRIL DAILY. Patient taking differently: Place 2 sprays into both nostrils daily. 01/20/20   [name], MD  glucose blood (ONETOUCH VERIO) test strip USE FOUR TIMES DAILY AS NEEDED Dx E11.9 08/30/19   [name], MD  hydrALAZINE (APRESOLINE) 100 MG tablet TAKE (1) TABLET BY MOUTH (3) TIMES DAILY. Patient taking differently: Take 100 mg by mouth 3 (three) times daily. 12/23/19   [name], MD  HYDROcodone-acetaminophen (NORCO/VICODIN) 5-325 MG tablet Take 1 tablet by mouth every 6 (six) hours as needed for moderate pain. 04/30/20   Carole Civil, MD  icosapent Ethyl (VASCEPA) 1 g capsule Take 2 capsules (2 g total) by mouth 2 (two) times daily. 02/04/19  Larey Dresser, MD  insulin glargine, 2 Unit Dial, (TOUJEO MAX SOLOSTAR) 300 UNIT/ML Solostar Pen Inject 10 Units into the skin at bedtime. 02/07/20   Orson Eva, MD  isosorbide mononitrate (IMDUR) 120 MG 24 hr tablet TAKE (1) TABLET BY MOUTH ONCE DAILY. Patient taking differently: Take 120 mg by mouth daily. 12/23/19   [name], MD  Lancets [facility] ULTRASOFT) lancets Use as instructed   DX E11.9 08/30/19   [name], MD  loratadine (CLARITIN) 10 MG tablet Take 1 tablet (10 mg total) by mouth daily. 04/12/19   Janora Norlander, DO  Multiple Vitamins-Minerals (CENTRUM SILVER ADULT 50+ PO) Take 1 tablet by mouth daily.    [provider]  nitroGLYCERIN (NITROSTAT) 0.4 MG SL tablet DISSOLVE 1 TAB UNDER TOUNGE FOR CHEST PAIN. MAY REPEAT EVERY 5 MINUTES FOR 3 DOSES. IF NO RELIEF CALL 911 OR GO TO ER 09/20/18   [name], MD  Omega-3 Fatty Acids (FISH OIL) 1000 MG CPDR Take 1,000 mg by mouth daily.     [provider]  oxyCODONE-acetaminophen (PERCOCET/ROXICET) 5-325 MG tablet  Take 1 tablet by mouth every 4 (four) hours as needed for severe pain. 04/17/20   Margarita Mail, PA-C  pantoprazole (PROTONIX) 40 MG tablet Take 1 tablet (40 mg total) by mouth daily. 07/11/19   [name], MD  pregabalin (LYRICA) 200 MG capsule Take 2 capsules (400 mg total) by mouth at bedtime. 03/18/19   [name], MD  PROAIR HFA 108 [phone] Base) MCG/ACT inhaler USE 2 PUFFS EVERY 6 HOURS AS NEEDED 11/11/19   [name], MD  promethazine (PHENERGAN) 12.5 MG tablet Take 1 tablet (12.5 mg total) by mouth every 6 (six) hours as needed for nausea or vomiting. 05/18/20   Carole Civil, MD  ranolazine (RANEXA) 500 MG 12 hr tablet TAKE 1 TABLET BY MOUTH TWICE DAILY. 06/24/19   Larey Dresser, MD  rosuvastatin (CRESTOR) 10 MG tablet TAKE 1 TABLET BY MOUTH ONCE A DAY. 12/23/19   [name], MD  Semaglutide, 1 MG/DOSE, (OZEMPIC, 1 MG/DOSE,) 2 MG/1.5ML SOPN Inject 1 mg into the skin once a week. 12/09/19   [name], MD  spironolactone (ALDACTONE) 25 MG tablet Take 25 mg by mouth 2 (two) times daily. 03/19/20   [provider]  terbinafine (LAMISIL) 1 % cream Apply 1 application topically 2 (two) times daily. Apply to both feet and between toes 04/10/18   [name], MD  tiZANidine (ZANAFLEX) 4 MG tablet Take 1 tablet (4 mg total) by mouth every 6 (six) hours as needed for muscle spasms. 09/03/19   [name], MD  torsemide (DEMADEX) 20 MG tablet Take 3 tablets (60 mg total) by mouth 2 (two) times daily. 02/07/20   Orson Eva, MD  Vitamin D, Ergocalciferol, (DRISDOL) 1.25 MG (50000 UNIT) CAPS capsule Take 1 capsule (50,000 Units total) by mouth every 7 (seven) days. Patient taking differently: Take 50,000 Units by mouth daily. 12/10/19 12/08/20  [name], MD  XARELTO 2.5 MG TABS tablet TAKE (1) TABLET BY MOUTH TWICE DAILY. Patient taking differently: Take 2.5 mg by mouth 2 (two) times daily. 09/27/19   Evans Lance, MD     Critical care time: 50 minutes     Georgann Housekeeper, AGACNP-BC [place] for personal pager PCCM on call pager [phone] until 7pm. Please call [place] 7p-7a. [phone]  05/26/2020 3:00 AM

## 2020-05-31 NOTE — Progress Notes (Signed)
Brief Nutrition Note RD working remotely.   Consult received for enteral/tube feeding initiation and management.  Adult Enteral Nutrition Protocol initiated. Full assessment to follow.  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 Pt meets criteria for obesity based on current BMI.  Labs:  Recent Labs  Lab 06/20/2020 0333 06/16/2020 0409  NA 138 138  K 4.2 4.3  CL 106  --   CO2 22  --   BUN 45*  --   CREATININE 2.81*  --   CALCIUM 9.2  --   MG 2.1  --   PHOS 4.8*  --   GLUCOSE 202*  --        Jarome Matin, MS, RD, LDN, CNSC Inpatient Clinical Dietitian RD pager # available in [facility]  After hours/weekend pager # available in [facility]

## 2020-05-31 NOTE — Progress Notes (Signed)
eLink Physician-Brief Progress Note Patient Name: Brad Mendoza DOB: April 26, 1955 MRN: [id number]   Date of Service  06/08/2020  HPI/Events of Note  28M with history of CAD, CKD, HFrEF (EF30% s/p ICD), and polysubstance abuse, as well as a recent RLE fracture after a fall, transferred here from [facility] where he initially presented via EMS with c/o seizure-like activity and decreased level of consciousness -- family reportedly found him at [place] with slurred speech (after previously been seen normal at 4:30pm). EMS reports he had what appeared to be generalized tonic-clonic seizure en route to ED. He was being mask-ventilated when he arrived in the ED and was then found to be pulseless -> CPR (non-shockable rhythm initially, then later after ROSC he had some NSVT then back to sinus rhythm / sinus tachycardia).  TeleNeurology recommended a stroke evaluation but this was not done due to his creatinine being elevated. CT Head wnl.  Labs notable for BNP 1060, lactate 3.1, WBC 15, and glucose 800 (at [facility] for which he was treated with insulin drip).   eICU Interventions  # Neuro: - MRI/MRA brain - Carotid doppler - EEG + Keppra loaded. - Sedation/analgesia with versed (+fentanyl) given c/f seizures.  # Endocrine: - ? HHS (would explain encephalopathy) vs severe hyperglycemia: Switch to SSI since CBGs are now in 190-200 range.  # Pulm: - Intubated/sedated. - Vent Mode: PRVC FiO2 (%):  [60 %-100 %] 60 % Set Rate:  [18 bmp] 18 bmp Vt Set:  [560 mL] 560 mL PEEP:  [5 cmH20] 5 cmH20 Plateau Pressure:  [18 cmH20] 18 cmH20 - ABG appropriate on current settings. - CXR shows possible early interstitial edema< ICD, and ETT tip at level of carina. Will retract ETT by 2cm.  # Cardiac: - Holding home Xarelto. - HFrEF (EF 30%): Would be cautious with administration of fluid.  # Renal: - AKI on CKD: CTM. Will likely need diuresis sometime in next 12-24 hours but monitor for now.  #  GI: - NPO for now.  DVT PPX: [place] heparin GI PPX: Protonix     Intervention Category Evaluation Type: New Patient Evaluation  Brad Mendoza 05/30/2020, 4:43 AM

## 2020-05-31 NOTE — Plan of Care (Signed)
Echo with reduced EF compared to previous, dilated chambers. Increasing diuresis and holding coreg.  No significant atherosclerotic disease on carotid US.  Julian Hy, DO 06/18/2020 6:09 PM [place] [facility]

## 2020-05-31 NOTE — Progress Notes (Signed)
NAME:  Brad Mendoza, MRN:  [id number], DOB:  [birth year], LOS: 0 ADMISSION DATE:  05/25/2020, CONSULTATION DATE: 4/10 REFERRING MD: Dr. Otelia Limes EDP [facility], CHIEF COMPLAINT: Cardiac arrest  History of Present Illness:  Patient is encephalopathic and/or intubated. Therefore history has been obtained from chart review.  65 year old male with past medical history as below, which is significant for CAD, CKD, DM, hypertension, ICD, polysubstance abuse. Recent history of RLE fracture after a fall and has been wheelchair bound. He presented to [facility] emergency department on 4/9 with complaints of seizure-like activity and decreased level of consciousness.  He was last known well around 4:30 PM when family found him around 5 PM he has slurred speech.  Upon EMS arrival slurred speech persisted.  As the patient was being transferred into the ambulance he developed agonal respirations and suffered a generalized tonic-clonic seizure.  This was abated with Versed.  He did not wake up from his postictal phase was bagged in route to the ED.    Upon arrival to the ED he was found to be pulseless and underwent CPR. ROSC after 3 - 4 rounds, non-shockable rhythm. Epi x 3, Amiodarone, Calcium Copious projectile Vomiting after ROSC Agonal respiration OG dropped before, suctioned, then uncomplicated intubation Non-sustained VT afterwards, now in sinus tach. Initial BPs with systolic in the 863O.  He had a CT of the head which was normal.  Telemetry neurology was involved and recommended a CTA, however, due to his elevated serum creatinine he was unable.  [facility] did not have MRI capabilities and the patient was referred for transfer to [facility].   Pertinent  Medical History   has a past medical history of CAD (coronary artery disease), Chronic back pain, CKD (chronic kidney disease), stage IV ([place]), Diverticulosis, Essential hypertension, GERD (gastroesophageal reflux disease), Hyperlipidemia, ICD  (implantable cardioverter-defibrillator) in place, Onychomycosis, Polysubstance abuse ([place]), Secondary cardiomyopathy ([place]), and Type 2 diabetes mellitus ([place]).   Significant Hospital Events: Including procedures, antibiotic start and stop dates in addition to other pertinent events   . 4/9 Sz, cardiac arrest . 4/10 tx to [facility] for ICU care  Interim History / Subjective:  Intubated, lightly sedated.  Objective   Blood pressure 115/81, pulse 65, temperature 98.4 F (36.9 C), temperature source Axillary, resp. rate 19, height 5\' 9"  (1.753 m), weight 96.2 kg, SpO2 98 %.    Vent Mode: PRVC FiO2 (%):  [40 %-100 %] 40 % Set Rate:  [12 bmp-18 bmp] 12 bmp Vt Set:  [560 mL] 560 mL PEEP:  [5 cmH20] 5 cmH20 Plateau Pressure:  [15 cmH20-18 cmH20] 15 cmH20   Intake/Output Summary (Last 24 hours) at 05/30/2020 1357 Last data filed at 05/27/2020 0900 Gross per 24 hour  Intake 123.7 ml  Output 900 ml  Net -776.3 ml   Filed Weights   06/02/2020 0200  Weight: 96.2 kg    Examination: General: Critically ill-appearing elderly man lying in bed intubated, minimally sedated HENT: [place]/AT, eyes anicteric, endotracheal tube in place Lungs: Breathing comfortably on mechanical ventilation, CTAB Cardiovascular: S1-S2, regular rate and rhythm Abdomen: Soft, nontender, nondistended Extremities: Cast on distal right lower extremity, no pretibial edema.  No clubbing or cyanosis Neuro: RASS +2, restraints of bilateral upper extremities, tracking but not following commands  Labs/imaging that I havepersonally reviewed  (right click and "Reselect all SmartList Selections" daily)  Glucose 194 K 4.5 UDS +TCH COVID neg Trop 801> 700>551 LA 2.2 BUN/creatinine 46/2.96 WBC 14.7  Resolved Hospital Problem  list     Assessment & Plan:   Cardiac arrest--concerned that this is the result of seizures.  Known history of coronary disease and chronic HFrEF increases his risk for sudden cardiac arrest. -  MRI/MRA brain ordered - Echo pending - EEG pending - Trop downtrended - Carotid dopplers pending -unsure why he is on PTA low-dose xarelto  Seizure preceded by acute encephalopathy: pre-cardiac arrest. Likely was due to uncontrolled hyperglycemia. No significant PTA ETOH use. No seizure-contributing meds, but his children are unsure what pain med he has been taking for his leg (concern for possible Ultram use) - Keppra given in ED, can stop for now and monitor - EEG pending - discussed with neurology> this seizure was likely provoked but they will follow along to assess imaging and EEG to assess for abnormalities  HHS IDDM: poorly controlled per wife. (A1c 8.1 in 01/2020). His children report he is not compliant with a diabetic diet and he has had very high BG since his foot fracture. - checking A1c - basal bolus insulin -unclear what home insulin regimen is based on prescription -hold PTA semaglutide  Acute encephalopathy: anoxic injury vs CVA, vs postictal. Could also be due to metabolic derangements. Seemed less significant today. - brain MRI pending  -EEG pending  Acute hypoxemic respiratory failure: poor airway protection post code.  -LTVV, 4-8 cc/kg ideal body weight goal plateau's and 30 driving pressure less than 15. - VAP prevention bundle - Daily WUA/SBT> very agitated today. 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 CAD s/p DES to RCA 02/2018 -Restart statin, aspirin.  -Hold Ranexa for now. -On Xarelto PTA for secondary CAD prevention (Compass trial protocol) per Cardiology; need to transition to low dose lovenox given renal dysfunction currently - Echo pending - On Xarelto BID> with , hold for now pending neuroimaging. If negative will need to start heparin infusion.  -tele monitoring  AKI on CKD -Continue monitoring renal  function -Hold Aldactone -Continue torsemide Hold Xarelto, may need to transition to Eliquis before discharge  Substance abuse history per EMR: UDS only positive for THC.  Likely only short-term use of opiates from recent foot fracture - Watch for withdrawal symptoms.    Best practice (right click and "Reselect all SmartList Selections" daily)  Diet:  NPO Pain/Anxiety/Delirium protocol (if indicated): Yes (RASS goal 0) VAP protocol (if indicated): Yes DVT prophylaxis: LMWH GI prophylaxis: PPI Glucose control:  SSI Yes Central venous access:  N/A Arterial line:  N/A Foley:  N/A Mobility:  bed rest  PT consulted: N/A Last date of multidisciplinary goals of care discussion [ ]  4/10- both adult children, RN present Code Status:  full code Disposition: ICU  Labs   CBC: Recent Labs  Lab 05/22/2020 0333 06/06/2020 0409 06/08/2020 0758  WBC 15.1*  --  14.7*  NEUTROABS 13.3*  --   --   HGB 10.4* 9.2* 9.5*  HCT 32.2* 27.0* 29.9*  MCV 82.1  --  81.5  PLT 195  --  812    Basic Metabolic Panel: Recent Labs  Lab 06/02/2020 0333 06/19/2020 0409 06/06/2020 0758  NA 138 138 137  K 4.2 4.3 4.5  CL 106  --  106  CO2 22  --  24  GLUCOSE 202*  --  194*  BUN 45*  --  46*  CREATININE 2.81*  --  2.96*  CALCIUM  9.2  --  8.9  MG 2.1  --  2.1  PHOS 4.8*  --   --    GFR: Estimated Creatinine Clearance: 28.8 mL/min (A) (by C-G formula based on SCr of 2.96 mg/dL (H)). Recent Labs  Lab 06/05/2020 0333 06/02/2020 0758 06/15/2020 1300  PROCALCITON 3.38  --   --   WBC 15.1* 14.7*  --   LATICACIDVEN 3.1* 2.8* 2.2*     This patient is critically ill with multiple organ system failure which requires frequent high complexity decision making, assessment, support, evaluation, and titration of therapies. This was completed through the application of advanced monitoring technologies and extensive interpretation of multiple databases. During this encounter critical care time was devoted to patient care  services described in this note for 45 minutes.  Julian Hy, DO 05/29/2020 2:45 PM [facility]

## 2020-05-31 NOTE — Progress Notes (Signed)
EEG complete - results pending 

## 2020-05-31 NOTE — Progress Notes (Signed)
VASCULAR LAB    Carotid duplex has been performed.  See CV proc for preliminary results.   Shelonda Saxe, RVT 05/26/2020, 9:53 AM

## 2020-05-31 NOTE — Progress Notes (Signed)
  Echocardiogram 2D Echocardiogram has been performed.  Brad Mendoza 05/23/2020, 2:08 PM

## 2020-06-01 ENCOUNTER — Inpatient Hospital Stay (HOSPITAL_COMMUNITY): Payer: Medicare Other

## 2020-06-01 DIAGNOSIS — G931 Anoxic brain damage, not elsewhere classified: Secondary | ICD-10-CM | POA: Diagnosis not present

## 2020-06-01 DIAGNOSIS — I469 Cardiac arrest, cause unspecified: Secondary | ICD-10-CM | POA: Diagnosis not present

## 2020-06-01 DIAGNOSIS — J9601 Acute respiratory failure with hypoxia: Secondary | ICD-10-CM | POA: Diagnosis not present

## 2020-06-01 LAB — HEMOGLOBIN A1C
Hgb A1c MFr Bld: 15.4 % — ABNORMAL HIGH (ref 4.8–5.6)
Mean Plasma Glucose: 395 mg/dL

## 2020-06-01 LAB — BASIC METABOLIC PANEL
Anion gap: 8 (ref 5–15)
BUN: 57 mg/dL — ABNORMAL HIGH (ref 8–23)
CO2: 23 mmol/L (ref 22–32)
Calcium: 8.5 mg/dL — ABNORMAL LOW (ref 8.9–10.3)
Chloride: 106 mmol/L (ref 98–111)
Creatinine, Ser: 3.05 mg/dL — ABNORMAL HIGH (ref 0.61–1.24)
GFR, Estimated: 22 mL/min — ABNORMAL LOW (ref >60)
Glucose, Bld: 230 mg/dL — ABNORMAL HIGH (ref 70–99)
Potassium: 4.3 mmol/L (ref 3.5–5.1)
Sodium: 137 mmol/L (ref 135–145)

## 2020-06-01 LAB — CBC
HCT: 29.8 % — ABNORMAL LOW (ref 39.0–52.0)
Hemoglobin: 9.5 g/dL — ABNORMAL LOW (ref 13.0–17.0)
MCH: 26 pg (ref 26.0–34.0)
MCHC: 31.9 g/dL (ref 30.0–36.0)
MCV: 81.6 fL (ref 80.0–100.0)
Platelets: 154 10*3/uL (ref 150–400)
RBC: 3.65 MIL/uL — ABNORMAL LOW (ref 4.22–5.81)
RDW: 15.5 % (ref 11.5–15.5)
WBC: 10 10*3/uL (ref 4.0–10.5)
nRBC: 0 % (ref 0.0–0.2)

## 2020-06-01 LAB — GLUCOSE, CAPILLARY
Glucose-Capillary: 267 mg/dL — ABNORMAL HIGH (ref 70–99)
Glucose-Capillary: 185 mg/dL — ABNORMAL HIGH (ref 70–99)
Glucose-Capillary: 185 mg/dL — ABNORMAL HIGH (ref 70–99)
Glucose-Capillary: 195 mg/dL — ABNORMAL HIGH (ref 70–99)
Glucose-Capillary: 181 mg/dL — ABNORMAL HIGH (ref 70–99)
Glucose-Capillary: 198 mg/dL — ABNORMAL HIGH (ref 70–99)
Glucose-Capillary: 204 mg/dL — ABNORMAL HIGH (ref 70–99)

## 2020-06-01 MED ORDER — INSULIN GLARGINE 100 UNIT/ML ~~LOC~~ SOLN
15.0000 [IU] | Freq: Two times a day (BID) | SUBCUTANEOUS | Status: DC
  Administered 2020-06-01 – 2020-06-06 (×10): 15 [IU] via SUBCUTANEOUS
  Filled 2020-06-01 (×13): qty 0.15

## 2020-06-01 MED ORDER — PROSOURCE TF PO LIQD
45.0000 mL | Freq: Three times a day (TID) | ORAL | Status: DC
  Administered 2020-06-01 – 2020-06-08 (×19): 45 mL
  Filled 2020-06-01 (×19): qty 45

## 2020-06-01 MED ORDER — VITAL HIGH PROTEIN PO LIQD
1000.0000 mL | ORAL | Status: DC
  Administered 2020-06-01 – 2020-06-08 (×10): 1000 mL

## 2020-06-01 NOTE — Progress Notes (Signed)
Initial Nutrition Assessment  DOCUMENTATION CODES:  Not applicable  INTERVENTION:  Adjust tube feeding rate via OGT: Vital High Protein at 55 ml/h (1320 ml per day) Prosource TF 45 ml TID Provides 1440 kcal, 148 gm protein, 1104 ml free water daily  NUTRITION DIAGNOSIS:  Inadequate oral intake related to inability to eat as evidenced by NPO status.  GOAL:  Patient will meet greater than or equal to 90% of their needs  MONITOR:  TF tolerance,Vent status  REASON FOR ASSESSMENT:  Consult Enteral/tube feeding initiation and management  ASSESSMENT:  Pt admitted after having seizure like activity at home and slurred speech. Initially taken to [facility] and was emergently intubated in ED after undergoing CPR. Transferred to [facility] for MRI capabilities. PMH relevant for CKD4, GERD, ICD in place, T2DM, HTN, CAD, HLD, diverticulosis, and polysubstance abuse.   Pt remains intubated and sedated at this time. Out of room at the time of assessment, no family present to provide a nutrition hx. Discussed in IDT rounds, currently undergoing MRI. May try SBT this afternoon. TF added 4/10. Will adjust rate to better meet nutrition needs. Family previously reported pt has been less mobile recently due to a leg fracture and that glucose has been extrememly elevated.   4/9 - Cardiac arrest at [facility], Intubated. 4/10 - transferred to [facility], ETT in place  Patient is currently intubated on ventilator support MV: 7.2 L/min Temp (24hrs), Avg:99 F (37.2 C), Min:98.1 F (36.7 C), Max:99.8 F (37.7 C)  Current tube feeding order via OGT (below diaphragm): Vital High Protein at 40 ml/h (960 ml per day) Prosource TF 45 ml BID Provides 1040 kcal, 106 gm protein, 779 ml free water daily  Medications reviewed and include: colace, miralax, insulin Labs reviewed: BUN 57, creatinine 3.05, glucose ranging from 194-230 since admission. last A1c 15.4% 4/10  NUTRITION - FOCUSED PHYSICAL  EXAM: Unable to assess at this time - pt unavailable  Diet Order:   Diet Order    None     EDUCATION NEEDS:  Not appropriate for education at this time  Skin:  Skin Assessment: Reviewed RN Assessment  Last BM:  unsure, PTA per RN documentation  Height:  Ht Readings from Last 1 Encounters:   5\' 9"  (1.753 m)    Weight:  Wt Readings from Last 1 Encounters:  06/01/20 95.1 kg    Ideal Body Weight:  72.7 kg  BMI:  Body mass index is 30.96 kg/m.  Estimated Nutritional Needs:   Kcal:  1100-1400 (11-14 kcal/kg ABW)  Protein:  145-180 (2-2.5 IBW)  Fluid:  >1500 mL/d  Ranell Patrick, RD, LDN Clinical Dietitian Pager on Amion

## 2020-06-01 NOTE — Progress Notes (Signed)
RT transported patient from 2M11 to MRI and back with RN. No complications. RT will continue to monitor.

## 2020-06-01 NOTE — Progress Notes (Addendum)
NAME:  Brad Mendoza, MRN:  [id number], DOB:  November 18, 1955, LOS: 1 ADMISSION DATE:  06/17/2020, CONSULTATION DATE:  4/10  REFERRING MD:  Dr. Otelia Limes, CHIEF COMPLAINT:  Cardiac Arrest    History of Present Illness:  Patient is intubated and sedated; therefore history has been obtained from chart review.  Brad Mendoza is a 65 y.o. 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 Patient presented to the [facility] ED on 4/9 with complaints of seizure-like activity and decreased level of consciousness: last normal 1630 with family finding him w/ slurred speech at 1700. As the patient was being transferred into the ambulance he developed agonal respirations and suffered a generalized tonic-clonic seizure. Seizure aborted w/ Versed but patient did wake up from his postictal phase and was bagged in route to the ED. Upon arrival to the ED he was found to be pulseless and underwent CPR. ROSC after 3-4 rounds, non-shockable rhythm - Epi x3, amiodarone, calcium. Pt had copies projectile emesis after ROSC; agonal respiration; OG dropped before, suctioned, then uncomplicated intubation Non-sustained VT afterwards, followed by sinus tach. Initial BPs with systolic in the 638T. Head CT at OSH showed no acute processes. Telemetry neurology was involved and recommended a CTA; however, was not preformed 2/2 CKD/elevated serum creatinine. [facility] did not have MRI capabilities and the patient was referred for transfer to [facility]. Planned MRI on 4/11.    Pertinent  Medical History  CAD s/p DES RCA, HFrEF s/p ICD CKD IV HTN, HLD, T2DM GERD Chronic back pain  Significant Hospital Events: Including procedures, antibiotic start and stop dates in addition to other pertinent events    4/9 Sz, cardiac arrest  4/10 [facility]  care  4/10 nonspecific EEG  Interim History / Subjective:  NAEON. Patient remains intubated.   Objective   Blood pressure 138/71, pulse 75, temperature 98.1 F (36.7 C), temperature source Oral, resp. rate 15, height _0  (1.753 m), weight 95.1 kg, SpO2 100 %.    Vent Mode: PRVC FiO2 (%):  [40 %] 40 % Set Rate:  [12 bmp] 12 bmp Vt Set:  [560 mL] 560 mL PEEP:  [5 cmH20] 5 cmH20 Plateau Pressure:  [15 cmH20-16 cmH20] 15 cmH20   Intake/Output Summary (Last 24 hours) at 06/01/2020 7711 Last data filed at 06/01/2020 0900 Gross per 24 hour  Intake 901.6 ml  Output 900 ml  Net 1.6 ml   Filed Weights   05/29/2020 0200 06/01/20 0333  Weight: 96.2 kg 95.1 kg    Examination: General: chronically ill appearing, NAD, no AV fistula site  HENT: MMM, ETT in place, OGT in place  Lungs: CTAB Cardiovascular: soft systolic I-II/VI systolic ejection murmur, no S3/S4, no rubs, no JVD, 2+ peripheral pulses, trace edema Abdomen: soft, non-distended Extremities: no rashes noted, skin dry  Neuro: pupils approx 53m, sluggish, reactive GU: foley in place   Labs/imaging that I havepersonally reviewed  (right click and "Reselect all SmartList Selections" daily)  CBC BMP UA EEG CXR  Resolved Hospital Problem list   N/A  Assessment & Plan:  Brad POKEis a 65y.o. male with a relevant PMHx of CAD s/p RCA DES (02/2018), HFrEF s/p ICD (EF~20-30%), CKD IV, T2DM c/b poor glycemic control (12/21 A1c: 8.1), and HTN a/f seizure-like activity, AMS followed by  cardiac arrest. Patient presentation c/f CVA but head CT demonstrated no acute processes. Brain MRI today to determine if underlying structural causes that precipitated seizure. Seizure likely caused metabolic derangement/hypoxemia that lead to cardiac arrest. Pt was found to have a BG in the 800s and an A1c of 15.4, which makes a seizure 2/2 HHS a likely etiology. Patient remains critically ill requiring ventilator support and with unknown  neurologic status.    Cardiac Arrest  Known history of coronary disease and chronic HFrEF increases his risk for sudden cardiac arrest. Cardiac arrest likely 2/2 to seizure (per above). - MRI/MRA brain ordered - Echo demonstrated EF 40-35%, LVH, diastolic dysfxn; atrial septum normal  - EEG nonspecific findings (below) - Trop downtrended - Carotid dopplers pending - Hold home xarelto  Acute hypoxemic respiratory failure Poor airway protection post code. Required intubation.  -LTVV, 4-8 cc/kg ideal body weight goal plateau's and 30 driving pressure less than 15. - VAP prevention bundle - Daily WUA/SBT    - Will attempt SBT/wean following MRI and further evidence of AMS etiology today   -Pad protocol for sedation-fentanyl and versed titrated to RASS 0  C/f Aspiratoin PNA vs Pneumonitis Following ROSC patient had emesis c/f aspiration. CXR from today exhibits increased/new RLL interstitial, patchy infiltrates. Afebrile and downtrending WBC. - CTM    Seizure preceded by acute encephalopathy  Pre-cardiac arrest. Likely was due to uncontrolled hyperglycemia. UA negative for ketones. No significant PTA ETOH use. UDS +TCH. No seizure-contributing meds, but his children are unsure what pain med he has been taking for his leg (concern for possible Ultram use). - Keppra given in ED     - Continue to hold ppx for now unless seizure activity resumes  - EEG demonstrated severe generalized slowing      - Nonspecific pattern c/w toxic, metabolic, or other abnml  - MRI for structural defects  IDDM c/f HHS Poorly controlled per wife/children (A1c 8.1 in 01/2020): has had very high BG since his foot fracture. Per above, likely culprit for provoke seizure.  -  A1c 15.4 (vs 8.1 in 01/2020)  - basal bolus insulin     - increase basal insulin for better control  -unclear what home insulin regimen is based on prescription       - Need to optimize  -hold PTA semaglutide  Acute  encephalopathy Evidence of slurred speech and AMS prior to arrest. DDx: anoxic injury vs CVA, vs postictal. vs metabolic derangements.  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 CAD s/p DES to RCA 02/2018 -Restart statin, aspirin.  -Hold Ranexa for now. -On Xarelto at home for secondary CAD prevention (Compass trial protocol) per Cardiology; need to transition to low dose lovenox given renal dysfunction currently - Echo pending - On Xarelto BID> with , hold for now pending neuroimaging. If negative will need to start heparin infusion.  -tele monitoring  Substance abuse History per EMR: UDS only positive for THC. Likely only short-term use of opiates from recent foot fracture. - Watch for withdrawal symptoms.  Best practice (right click and "Reselect all SmartList Selections" daily)  Diet:  Tube Feed  Pain/Anxiety/Delirium protocol (if indicated): Yes (RASS goal 0) VAP protocol (if indicated): Yes DVT prophylaxis: LMWH GI prophylaxis: PPI Glucose control:  SSI Yes and Basal insulin Yes Central venous access:  N/A Arterial line:  N/A Foley:  Yes, and it is still needed Mobility:  bed rest  PT consulted: N/A Last date of multidisciplinary goals of care discussion [TBD] Code Status:  full code Disposition: ICU   Labs   CBC: Recent Labs  Lab 06/16/2020 0333 05/28/2020 0409 06/09/2020 0758  WBC 15.1*  --  14.7*  NEUTROABS 13.3*  --   --   HGB 10.4* 9.2* 9.5*  HCT 32.2* 27.0* 29.9*  MCV 82.1  --  81.5  PLT 195  --  643    Basic Metabolic Panel: Recent Labs  Lab 06/04/2020 0333  05/23/2020 0409 06/18/2020 0758  NA 138 138 137  K 4.2 4.3 4.5  CL 106  --  106  CO2 22  --  24  GLUCOSE 202*  --  194*  BUN 45*  --  46*  CREATININE 2.81*  --  2.96*  CALCIUM 9.2  --  8.9  MG 2.1  --  2.1  PHOS 4.8*  --   --    GFR: Estimated Creatinine Clearance: 28.7 mL/min (A) (by C-G formula based on SCr of 2.96 mg/dL (H)). Recent Labs  Lab 06/15/2020 0333 06/03/2020 0758 06/04/2020 1300 06/17/2020 1520  PROCALCITON 3.38  --   --   --   WBC 15.1* 14.7*  --   --   LATICACIDVEN 3.1* 2.8* 2.2* 2.3*    Liver Function Tests: Recent Labs  Lab 05/23/2020 0333  AST 122*  ALT 107*  ALKPHOS 118  BILITOT 0.7  PROT 5.2*  ALBUMIN 2.2*   Recent Labs  Lab 06/08/2020 0333  LIPASE 28  AMYLASE 152*   No results for input(s): AMMONIA in the last 168 hours.  ABG    Component Value Date/Time   PHART 7.347 (L) 05/25/2020 0409   PCO2ART 43.9 05/22/2020 0409   PO2ART 113 (H) 06/06/2020 0409   HCO3 24.1 06/01/2020 0409   TCO2 25 05/24/2020 0409   ACIDBASEDEF 2.0 06/16/2020 0409   O2SAT 98.0 06/14/2020 0409     Coagulation Profile: No results for input(s): INR, PROTIME in the last 168 hours.  Cardiac Enzymes: No results for input(s): CKTOTAL, CKMB, CKMBINDEX, TROPONINI in the last 168 hours.  HbA1C: HB A1C (BAYER DCA - WAIVED)  Date/Time Value Ref Range Status  12/09/2019 01:10 PM 7.8 (H) <7.0 % Final    Comment:                                          Diabetic Adult            <7.0                                       Healthy Adult        4.3 - 5.7                                                           (  DCCT/NGSP) American Diabetes Association's Summary of Glycemic Recommendations for Adults with Diabetes: Hemoglobin A1c <7.0%. More stringent glycemic goals (A1c <6.0%) may further reduce complications at the cost of increased risk of hypoglycemia.   09/03/2019 12:51 PM >14.0 (H) <7.0 % Final    Comment:                                          Diabetic Adult             <7.0                                       Healthy Adult        4.3 - 5.7                                                           (DCCT/NGSP) American Diabetes Association's Summary of Glycemic Recommendations for Adults with Diabetes: Hemoglobin A1c <7.0%. More stringent glycemic goals (A1c <6.0%) may further reduce complications at the cost of increased risk of hypoglycemia.    Hgb A1c MFr Bld  Date/Time Value Ref Range Status  02/01/2020 06:48 PM 8.1 (H) 4.8 - 5.6 % Final    Comment:    (NOTE) Pre diabetes:          5.7%-6.4%  Diabetes:              >6.4%  Glycemic control for   <7.0% adults with diabetes     CBG: Recent Labs  Lab 06/13/2020 1526 06/13/2020 1944 06/09/2020 2342 06/01/20 0332 06/01/20 0743  GLUCAP 149* 175* 200* 185* 185*    Review of Systems:     Past Medical History:  He,  has a past medical history of CAD (coronary artery disease), Chronic back pain, CKD (chronic kidney disease), stage IV ([place]), Diverticulosis, Essential hypertension, GERD (gastroesophageal reflux disease), Hyperlipidemia, ICD (implantable cardioverter-defibrillator) in place, Onychomycosis, Polysubstance abuse ([place]), Secondary cardiomyopathy ([place]), and Type 2 diabetes mellitus ([place]).   Surgical History:   Past Surgical History:  Procedure Laterality Date  . APPENDECTOMY    . CARDIAC CATHETERIZATION  2005   4 frech cath  . COLONOSCOPY N/A 08/01/2013   Procedure: COLONOSCOPY;  Surgeon: Rogene Houston, MD;  [facility];  Service: Endoscopy;  Laterality: N/A;  rescheduled to [place] notified pt  . CORONARY STENT INTERVENTION N/A 03/19/2018   Procedure: CORONARY STENT INTERVENTION;  Surgeon: Leonie Man, MD;  Location: [place] [facility];  Service: Cardiovascular;  Laterality: N/A;  . ESOPHAGOGASTRODUODENOSCOPY N/A 04/23/2015   Procedure: ESOPHAGOGASTRODUODENOSCOPY (EGD);  Surgeon: Rogene Houston, MD;  [facility];  Service: Endoscopy;  Laterality: N/A;   1:25  . RIGHT/LEFT HEART CATH AND CORONARY ANGIOGRAPHY N/A 06/03/2016   Procedure: Right/Left Heart Cath and Coronary Angiography;  Surgeon: Belva Crome, MD;  Location: [place] [facility];  Service: Cardiovascular;  Laterality: N/A;  . RIGHT/LEFT HEART CATH AND CORONARY ANGIOGRAPHY N/A 03/19/2018   Procedure: RIGHT/LEFT HEART CATH AND CORONARY ANGIOGRAPHY;  Surgeon: Jolaine Artist, MD;  Location: [place] [facility];  Service: Cardiovascular;  Laterality: N/A;  .  SUBQ ICD IMPLANT N/A 05/27/2019   Procedure: SUBQ ICD IMPLANT;  Surgeon: Evans Lance, MD;  Location: [place] [facility];  Service: Cardiovascular;  Laterality: N/A;     Social History:   reports that he has never smoked. His smokeless tobacco use includes snuff. He reports current alcohol use of about 1.0 standard drink of alcohol per week. He reports current drug use. Drug: Marijuana.   Family History:  His family history includes Alcohol abuse in his brother; Diabetes in his father; Hypertension in his father; Lung cancer in his mother. There is no history of Colon cancer.   Allergies Allergies  Allergen Reactions  . Sulfa Antibiotics Shortness Of Breath  . Penicillins Rash and Other (See Comments)    Has patient had a PCN reaction causing immediate rash, facial/tongue/throat swelling, SOB or lightheadedness with hypotension: No Has patient had a PCN reaction causing severe rash involving mucus membranes or skin necrosis: No Has patient had a PCN reaction that required hospitalization No Has patient had a PCN reaction occurring within the last 10 years: No If all of the above answers are "NO", then may proceed with Cephalosporin use.      Home Medications  Prior to Admission medications   Medication Sig Start Date End Date Taking? Authorizing Provider  aspirin EC 81 MG tablet Take 81 mg by mouth daily.   Yes [provider]  carvedilol (COREG) 6.25 MG tablet TAKE 1 AND 1/2 TABLET BY MOUTH TWICE  DAILY. Patient taking differently: Take 9.375 mg by mouth 2 (two) times daily with a meal. 04/22/20  Yes Larey Dresser, MD  fluticasone (FLONASE) 50 MCG/ACT nasal spray INSTILL 2 SPRAYS INTO EACH NOSTRIL DAILY. Patient taking differently: Place 2 sprays into both nostrils daily as needed for allergies. 01/20/20  Yes Stacks, Cletus Gash, MD  hydrALAZINE (APRESOLINE) 100 MG tablet TAKE (1) TABLET BY MOUTH (3) TIMES DAILY. Patient taking differently: Take 100 mg by mouth 3 (three) times daily. 12/23/19  Yes Stacks, Cletus Gash, MD  insulin glargine, 2 Unit Dial, (TOUJEO MAX SOLOSTAR) 300 UNIT/ML Solostar Pen Inject 10 Units into the skin at bedtime. Patient taking differently: Inject 40-50 Units into the skin See admin instructions. Inject 50 units into the skin in the morning before breakfast and inject 40 units into the skin in the evening before supper. 02/07/20  Yes Tat, Shanon Brow, MD  isosorbide mononitrate (IMDUR) 120 MG 24 hr tablet TAKE (1) TABLET BY MOUTH ONCE DAILY. Patient taking differently: Take 120 mg by mouth daily. 12/23/19  Yes Stacks, Cletus Gash, MD  nitroGLYCERIN (NITROSTAT) 0.4 MG SL tablet DISSOLVE 1 TAB UNDER TOUNGE FOR CHEST PAIN. MAY REPEAT EVERY 5 MINUTES FOR 3 DOSES. IF NO RELIEF CALL 911 OR GO TO ER Patient taking differently: Place 0.4 mg under the tongue every 5 (five) minutes x 3 doses as needed for chest pain. 09/20/18  Yes [name], MD  pantoprazole (PROTONIX) 40 MG tablet Take 1 tablet (40 mg total) by mouth daily. 07/11/19  Yes Stacks, Cletus Gash, MD  potassium chloride SA (KLOR-CON) 20 MEQ tablet Take 20 mEq by mouth daily. 05/22/20  Yes [provider]  PROAIR HFA 108 (90 Base) MCG/ACT inhaler USE 2 PUFFS EVERY 6 HOURS AS NEEDED Patient taking differently: Inhale 2 puffs into the lungs every 6 (six) hours as needed for wheezing or shortness of breath. 11/11/19  Yes Stacks, Cletus Gash, MD  ranolazine (RANEXA) 500 MG 12 hr tablet TAKE 1 TABLET BY MOUTH TWICE DAILY. Patient taking  differently: Take  500 mg by mouth 2 (two) times daily. 06/24/19  Yes Larey Dresser, MD  rosuvastatin (CRESTOR) 10 MG tablet TAKE 1 TABLET BY MOUTH ONCE A DAY. Patient taking differently: Take 10 mg by mouth daily. 12/23/19  Yes Stacks, Cletus Gash, MD  Semaglutide, 1 MG/DOSE, (OZEMPIC, 1 MG/DOSE,) 2 MG/1.5ML SOPN Inject 1 mg into the skin once a week. 12/09/19  Yes Stacks, Cletus Gash, MD  spironolactone (ALDACTONE) 25 MG tablet Take 25 mg by mouth 2 (two) times daily. 03/19/20  Yes [provider]  torsemide (DEMADEX) 20 MG tablet Take 3 tablets (60 mg total) by mouth 2 (two) times daily. Patient taking differently: Take 80 mg by mouth 2 (two) times daily. 02/07/20  Yes Tat, Shanon Brow, MD  XARELTO 2.5 MG TABS tablet TAKE (1) TABLET BY MOUTH TWICE DAILY. Patient taking differently: Take 2.5 mg by mouth 2 (two) times daily. 09/27/19  Yes Evans Lance, MD  blood glucose meter kit and supplies KIT Dispense based on patient and insurance preference. Test four times daily as directed. (FOR ICD-10 : E11.9 03/21/19   [name], MD  Blood Glucose Monitoring Suppl (ONETOUCH VERIO) w/Device KIT 1 Device by Does not apply route 2 (two) times daily. 04/01/19   Loman Brooklyn, FNP  Continuous Blood Gluc Sensor (FREESTYLE LIBRE 2 SENSOR) MISC Use to test blood sugar up to 6 times daily as directed. E11.9 09/23/19   [name], MD  glucose blood (ONETOUCH VERIO) test strip USE FOUR TIMES DAILY AS NEEDED Dx E11.9 08/30/19   [name], MD  HYDROcodone-acetaminophen (NORCO/VICODIN) 5-325 MG tablet Take 1 tablet by mouth every 6 (six) hours as needed for moderate pain. Patient not taking: No sig reported 04/30/20   Carole Civil, MD  icosapent Ethyl (VASCEPA) 1 g capsule Take 2 capsules (2 g total) by mouth 2 (two) times daily. Patient not taking: No sig reported 02/04/19   Larey Dresser, MD  Lancets [facility] ULTRASOFT) lancets Use as instructed   DX E11.9 08/30/19   [name], MD  loratadine  (CLARITIN) 10 MG tablet Take 1 tablet (10 mg total) by mouth daily. Patient not taking: No sig reported 04/12/19   Janora Norlander, DO  oxyCODONE-acetaminophen (PERCOCET/ROXICET) 5-325 MG tablet Take 1 tablet by mouth every 4 (four) hours as needed for severe pain. Patient not taking: No sig reported 04/17/20   Margarita Mail, PA-C  pregabalin (LYRICA) 200 MG capsule Take 2 capsules (400 mg total) by mouth at bedtime. Patient not taking: No sig reported 03/18/19   [name], MD  promethazine (PHENERGAN) 12.5 MG tablet Take 1 tablet (12.5 mg total) by mouth every 6 (six) hours as needed for nausea or vomiting. Patient not taking: No sig reported 05/18/20   Carole Civil, MD  terbinafine (LAMISIL) 1 % cream Apply 1 application topically 2 (two) times daily. Apply to both feet and between toes Patient not taking: No sig reported 04/10/18   [name], MD  tiZANidine (ZANAFLEX) 4 MG tablet Take 1 tablet (4 mg total) by mouth every 6 (six) hours as needed for muscle spasms. Patient not taking: No sig reported 09/03/19   [name], MD  Vitamin D, Ergocalciferol, (DRISDOL) 1.25 MG (50000 UNIT) CAPS capsule Take 1 capsule (50,000 Units total) by mouth every 7 (seven) days. Patient not taking: No sig reported 12/10/19 12/08/20  [name], MD     Domenick Bookbinder, MS4    PCCM:  65 yo M, admitted s/p cardiac arrest, presumed PEA, AHRF  on MV, possible seizure, HHS with hyperglycemia and multiple metabolic derrangements.   Remains on vent. EEG neg for seizure.  BP (!) 156/81 (BP Location: Right Arm)   Pulse 85   Temp 99.1 F (37.3 C) (Axillary)   Resp 18   Ht _0  (1.753 m)   Wt 95.1 kg   SpO2 98%   BMI 30.96 kg/m   Gen: eldelry male, intuabed on life support HENT: ETT in place  Heart: RRR S1 s2  Lungs: BL vented breaths  Abd: Soft, nt nd   Labs reviewed  MRI - no anoxic injury no stroke. imatges reviewed   A:  Cardiac arrest AHRF on MV  Possible aspiration  pnumonitis  Possible seizure  Hyperglycemia with HHS  AKI on CKD IV   P: Support post arrest care  No fevers if possible  Insulin continued  Discussed with pharmacy to make changes to regimen  EEG was non-specific with no epileptic activity  Follow UOP and Scr  Wean from sedation  Possible consideration for extubation once able to tolerate SAT SBT  This patient is critically ill with multiple organ system failure; which, requires frequent high complexity decision making, assessment, support, evaluation, and titration of therapies. This was completed through the application of advanced monitoring technologies and extensive interpretation of multiple databases. During this encounter critical care time was devoted to patient care services described in this note for 32 minutes.  Garner Nash, DO [facility] 06/01/2020 4:50 PM

## 2020-06-01 NOTE — Progress Notes (Signed)
Per order, Changed device settings for MRI to MRI Mode (Tachy-therapies to off)  Will program device back to pre-MRI settings after completion of exam.

## 2020-06-02 ENCOUNTER — Inpatient Hospital Stay (HOSPITAL_COMMUNITY): Payer: Medicare Other

## 2020-06-02 DIAGNOSIS — E1101 Type 2 diabetes mellitus with hyperosmolarity with coma: Secondary | ICD-10-CM | POA: Diagnosis not present

## 2020-06-02 DIAGNOSIS — J9601 Acute respiratory failure with hypoxia: Secondary | ICD-10-CM | POA: Diagnosis not present

## 2020-06-02 DIAGNOSIS — I469 Cardiac arrest, cause unspecified: Secondary | ICD-10-CM | POA: Diagnosis not present

## 2020-06-02 LAB — HEPATIC FUNCTION PANEL
ALT: 39 U/L (ref 0–44)
AST: 20 U/L (ref 15–41)
Albumin: 1.6 g/dL — ABNORMAL LOW (ref 3.5–5.0)
Alkaline Phosphatase: 118 U/L (ref 38–126)
Bilirubin, Direct: 0.1 mg/dL (ref 0.0–0.2)
Indirect Bilirubin: 0.7 mg/dL (ref 0.3–0.9)
Total Bilirubin: 0.8 mg/dL (ref 0.3–1.2)
Total Protein: 4.6 g/dL — ABNORMAL LOW (ref 6.5–8.1)

## 2020-06-02 LAB — CBC
HCT: 25 % — ABNORMAL LOW (ref 39.0–52.0)
Hemoglobin: 8 g/dL — ABNORMAL LOW (ref 13.0–17.0)
MCH: 25.8 pg — ABNORMAL LOW (ref 26.0–34.0)
MCHC: 32 g/dL (ref 30.0–36.0)
MCV: 80.6 fL (ref 80.0–100.0)
Platelets: 136 10*3/uL — ABNORMAL LOW (ref 150–400)
RBC: 3.1 MIL/uL — ABNORMAL LOW (ref 4.22–5.81)
RDW: 15.7 % — ABNORMAL HIGH (ref 11.5–15.5)
WBC: 9.9 10*3/uL (ref 4.0–10.5)
nRBC: 0 % (ref 0.0–0.2)

## 2020-06-02 LAB — GLUCOSE, CAPILLARY
Glucose-Capillary: 199 mg/dL — ABNORMAL HIGH (ref 70–99)
Glucose-Capillary: 211 mg/dL — ABNORMAL HIGH (ref 70–99)
Glucose-Capillary: 197 mg/dL — ABNORMAL HIGH (ref 70–99)
Glucose-Capillary: 200 mg/dL — ABNORMAL HIGH (ref 70–99)
Glucose-Capillary: 171 mg/dL — ABNORMAL HIGH (ref 70–99)
Glucose-Capillary: 151 mg/dL — ABNORMAL HIGH (ref 70–99)

## 2020-06-02 LAB — CULTURE, RESPIRATORY W GRAM STAIN
Culture: NORMAL
Special Requests: NORMAL

## 2020-06-02 LAB — BASIC METABOLIC PANEL
Anion gap: 8 (ref 5–15)
BUN: 72 mg/dL — ABNORMAL HIGH (ref 8–23)
CO2: 23 mmol/L (ref 22–32)
Calcium: 8 mg/dL — ABNORMAL LOW (ref 8.9–10.3)
Chloride: 106 mmol/L (ref 98–111)
Creatinine, Ser: 3.19 mg/dL — ABNORMAL HIGH (ref 0.61–1.24)
GFR, Estimated: 21 mL/min — ABNORMAL LOW (ref >60)
Glucose, Bld: 220 mg/dL — ABNORMAL HIGH (ref 70–99)
Potassium: 4.4 mmol/L (ref 3.5–5.1)
Sodium: 137 mmol/L (ref 135–145)

## 2020-06-02 LAB — HEMOGLOBIN AND HEMATOCRIT, BLOOD
HCT: 25.1 % — ABNORMAL LOW (ref 39.0–52.0)
Hemoglobin: 7.9 g/dL — ABNORMAL LOW (ref 13.0–17.0)

## 2020-06-02 MED ORDER — FENTANYL 2500MCG IN NS 250ML (10MCG/ML) PREMIX INFUSION
0.0000 ug/h | INTRAVENOUS | Status: DC
  Administered 2020-06-02: 25 ug/h via INTRAVENOUS
  Administered 2020-06-02: 100 ug/h via INTRAVENOUS
  Administered 2020-06-04: 50 ug/h via INTRAVENOUS
  Filled 2020-06-02 (×3): qty 250

## 2020-06-02 MED ORDER — INSULIN ASPART 100 UNIT/ML ~~LOC~~ SOLN
4.0000 [IU] | SUBCUTANEOUS | Status: DC
  Administered 2020-06-02 – 2020-06-05 (×19): 4 [IU] via SUBCUTANEOUS

## 2020-06-02 MED ORDER — ACETAMINOPHEN 325 MG PO TABS
650.0000 mg | ORAL_TABLET | Freq: Four times a day (QID) | ORAL | Status: DC | PRN
  Administered 2020-06-02 – 2020-06-07 (×4): 650 mg via ORAL
  Filled 2020-06-02 (×4): qty 2

## 2020-06-02 MED ORDER — SODIUM CHLORIDE 0.9 % IV SOLN
2.0000 g | INTRAVENOUS | Status: DC
  Administered 2020-06-02 – 2020-06-06 (×5): 2 g via INTRAVENOUS
  Filled 2020-06-02 (×6): qty 20

## 2020-06-02 MED ORDER — FUROSEMIDE 10 MG/ML IJ SOLN
20.0000 mg | Freq: Every day | INTRAMUSCULAR | Status: DC
  Administered 2020-06-02: 20 mg via INTRAVENOUS
  Filled 2020-06-02: qty 2

## 2020-06-02 MED ORDER — DEXMEDETOMIDINE HCL IN NACL 400 MCG/100ML IV SOLN
0.4000 ug/kg/h | INTRAVENOUS | Status: DC
  Administered 2020-06-02: 0.4 ug/kg/h via INTRAVENOUS
  Administered 2020-06-03: 0.6 ug/kg/h via INTRAVENOUS
  Administered 2020-06-03: 0.3 ug/kg/h via INTRAVENOUS
  Administered 2020-06-03: 0.5 ug/kg/h via INTRAVENOUS
  Administered 2020-06-04: 0.3 ug/kg/h via INTRAVENOUS
  Filled 2020-06-02 (×5): qty 100

## 2020-06-02 MED ORDER — SODIUM CHLORIDE 0.9 % IV SOLN: 3.0000 g | Freq: Two times a day (BID) | INTRAVENOUS | Status: DC

## 2020-06-02 NOTE — Progress Notes (Signed)
Pharmacy Antibiotic Note  Brad Mendoza is a 65 y.o. male admitted on 05/24/2020 with temp spike with likely infection - unknown source.  Pharmacy has been consulted for Rocephin dosing.  Plan: Rocephin 2 gm IV q24hr Monitor clinical status, cultures and LOT  Height: 5\' 9"  (175.3 cm) Weight: 95.7 kg (210 lb 15.7 oz) IBW/kg (Calculated) : 70.7  Temp (24hrs), Avg:100.4 F (38 C), Min:98.7 F (37.1 C), Max:102.7 F (39.3 C)  Recent Labs  Lab 06/04/2020 0333 06/08/2020 0758 05/30/2020 1300 05/27/2020 1520 06/01/20 1024 06/02/20 0118  WBC 15.1* 14.7*  --   --  10.0 9.9  CREATININE 2.81* 2.96*  --   --  3.05* 3.19*  LATICACIDVEN 3.1* 2.8* 2.2* 2.3*  --   --     Estimated Creatinine Clearance: 26.7 mL/min (A) (by C-G formula based on SCr of 3.19 mg/dL (H)).    Allergies  Allergen Reactions  . Sulfa Antibiotics Shortness Of Breath  . Penicillins Rash and Other (See Comments)    Has patient had a PCN reaction causing immediate rash, facial/tongue/throat swelling, SOB or lightheadedness with hypotension: No Has patient had a PCN reaction causing severe rash involving mucus membranes or skin necrosis: No Has patient had a PCN reaction that required hospitalization No Has patient had a PCN reaction occurring within the last 10 years: No If all of the above answers are "NO", then may proceed with Cephalosporin use.     Antimicrobials this admission: Rocephin 4/12 >>   Thank you for allowing pharmacy to be a part of this patient's care.  Alanda Slim, PharmD, [facility] Clinical Pharmacist Please see AMION for all Pharmacists' Contact Phone Numbers 06/02/2020, 10:53 AM

## 2020-06-02 NOTE — Progress Notes (Addendum)
 NAME:  Brad Mendoza, MRN:  [id number], DOB:  05/09/1955, LOS: 2 ADMISSION DATE:  05/24/2020, CONSULTATION DATE:  4/10  REFERRING MD:  Dr. Falcone, CHIEF COMPLAINT:  Cardiac Arrest    History of Present Illness:  Patient is intubated and sedated; therefore history has been obtained from chart review.  Brad Mendoza is a 65 y.o. 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 Patient presented to the [facility] ED on 4/9 with complaints of seizure-like activity and decreased level of consciousness: last normal 1630 with family finding him w/ slurred speech at 1700. As the patient was being transferred into the ambulance he developed agonal respirations and suffered a generalized tonic-clonic seizure. Seizure aborted w/ Versed but patient did wake up from his postictal phase and was bagged in route to the ED. Upon arrival to the ED he was found to be pulseless and underwent CPR. ROSC after 3-4 rounds, non-shockable rhythm - Epi x3, amiodarone, calcium. Pt had copies projectile emesis after ROSC; agonal respiration; OG dropped before, suctioned, then uncomplicated intubation Non-sustained VT afterwards, followed by sinus tach. Initial BPs with systolic in the 230s. Head CT at OSH showed no acute processes. Telemetry neurology was involved and recommended a CTA; however, was not preformed 2/2 CKD/elevated serum creatinine. [facility] did not have MRI capabilities and the patient was referred for transfer to [facility]. Planned MRI on 4/11.    Pertinent  Medical History  CAD s/p DES RCA, HFrEF s/p ICD CKD IV HTN, HLD, T2DM GERD Chronic back pain  Significant Hospital Events: Including procedures, antibiotic start and stop dates in addition to other pertinent events    4/9 Sz, cardiac arrest  4/10 tx to [place] for ICU  care  4/10 nonspecific EEG, largely unremarkable MRI/MRA  Interim History / Subjective:  NAEON. Patient remains intubated but is tolerating 5/5 PS/CPAP this morning. Requiring sedation for ventilator synchrony. Wife updated last night.   Objective   Blood pressure (!) 129/56, pulse 89, temperature (!) 102.7 F (39.3 C), temperature source Oral, resp. rate 16, height 5' 9" (1.753 m), weight 95.7 kg, SpO2 97 %.    Vent Mode: CPAP;PSV FiO2 (%):  [40 %] 40 % Set Rate:  [12 bmp] 12 bmp Vt Set:  [560 mL] 560 mL PEEP:  [5 cmH20] 5 cmH20 Pressure Support:  [5 cmH20] 5 cmH20 Plateau Pressure:  [13 cmH20-18 cmH20] 17 cmH20   Intake/Output Summary (Last 24 hours) at 06/02/2020 0918 Last data filed at 06/02/2020 0900 Gross per 24 hour  Intake 1484.44 ml  Output 615 ml  Net 869.44 ml   Filed Weights   05/28/2020 0200 06/01/20 0333 06/02/20 0337  Weight: 96.2 kg 95.1 kg 95.7 kg    Examination: General: chronically ill appearing, NAD, no AV fistula site  HENT: MMM, ETT in place, OGT in place  Lungs: CTAB with some bilateral coarse sounds in anterior Tholl  Cardiovascular: nml s1/s2, no m/r/g, no JVD, negative hepatojugular reflex, 2+ peripheral pulses, trace edema Abdomen: soft, non-distended Extremities: no rashes noted, skin dry  Neuro: pupils approx 3mm, sluggish, reactive GU: foley in place   Labs/imaging that I havepersonally reviewed  (right click and "Reselect all SmartList Selections" daily)  CBC BMP   Resolved Hospital Problem list   N/A  Assessment & Plan:  Brad Mendoza is   a 65 y.o. male with a relevant PMHx of CAD s/p RCA DES (02/2018), HFrEF s/p ICD (EF~20-30%), CKD IV, T2DM c/b poor glycemic control (12/21 A1c: 8.1 > 06/09/2020 15.4), and HTN a/f seizure-like activity, AMS followed by cardiac arrest. Patient presentation c/f CVA or anoxic encephalopathy but head CT and MRI/MRA demonstrated no acute processes or diffuse injuries. Pt was found to have a BG in the 800s and  an A1c of 15.4, which makes a provoked seizure 2/2 HHS a likely etiology given non-specific EEG and unremarkable imaging results. Patient remains critically ill with unknown neurologic status but with a goal of ventilator weaning today.   Cardiac Arrest  Known history of coronary disease and chronic HFrEF increases his risk for sudden cardiac arrest. Cardiac arrest likely 2/2 to seizure (per above). Will need to assess neurologic function following extubation.  - MRI/MRA largely unremarkable - no evidence of CVA or anoxic brain injury  - Echo demonstrated EF 44-03%, LVH, diastolic dysfxn; atrial septum normal  - EEG nonspecific findings (below) - Trop downtrended - Carotid dopplers pending - Hold home xarelto  Acute hypoxemic respiratory failure Poor airway protection post code. Required intubation. Goal to wean and extubate today. Patient currently tolerating 5/5 of PS/CPAP mode.  -LTVV, 4-8 cc/kg ideal body weight goal plateau's and 30 driving pressure less than 15. - Daily WUA/SBT - VAP prevention bundle -Pad protocol for sedation-fentanyl and versed titrated to RASS 0  C/f Aspiratoin PNA vs Pneumonitis Following ROSC patient had emesis c/f aspiration. Steadily increasing fever that started overnight in setting of net downtrending WBC (14.7 > 10 > 9.9).  Less likely to be VAP given timeframe.  - Repeat CXR (pending) - Start Unasyn for potential aspiration PNA      - Will need renal dosing   Hemoglobin Decrease Hemoglobin decreased from 9.5 > 9.5 > 8 with downtrending platelets 173 > 154 > 136 but a relatively stable WBC 14.7 > 10 > 9.9; therefore, not entirely c/w hemodilution. No evidence of acute bleed. Was bathed last night with no sites identified. On PPI for gastric ulcer ppx. On lovenox for VTE ppx.  - Repeat H&H this afternoon  - CTM for occult bleed - Tranfuse Hb < 8 given CAD  Seizure preceded by acute encephalopathy  Pre-cardiac arrest. Likely provoked seizure 2/2  uncontrolled hyperglycemia. UA negative for ketones. No significant PTA ETOH use. UDS +TCH. No seizure-contributing meds, but his children are unsure what pain med he has been taking for his leg (concern for possible Ultram use).  - Keppra given in ED     - Continue to hold ppx for now unless seizure activity resumes  - EEG demonstrated severe generalized slowing      - Nonspecific pattern c/w toxic, metabolic, or other abnml  - MRI largely unremarkable   IDDM c/f HHS Poorly controlled per wife/children (A1c 8.1 in 01/2020): has had very high BG since his foot fracture. Per above, likely culprit for provoke seizure.  -  A1c 15.4 (vs 8.1 in 01/2020)  - basal bolus insulin     - increase basal insulin for better control  -unclear what home insulin regimen is based on prescription       - Need to optimize  -hold PTA semaglutide  Acute encephalopathy Evidence of slurred speech and AMS prior to arrest. Given MRI/MRA most likely dx is metabolic derangement.  - brain MRI as above, w/out evidence   -EEG as above, nonspecific - OSH CT w/out evidence of acute  abnml  AKI on CKD IV - Cr: 2.81 > 2.96 > 3.05 > 3.19 - Continue monitoring renal function     - Recent baseline Cr approx 2.0 - 2.5  - Hold Aldactone - Continue home torsemide - As above, hold Xarelto, may need to transition to Eliquis before discharge  Transaminitis Prior elevated LFTs in setting of normal Tbili. Likely 2/2 ischemic insult from cardiac arrest.  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 CAD s/p DES to RCA 02/2018 -Restart statin, aspirin.  -Hold Ranexa for now. -On Xarelto at home for secondary CAD prevention (Compass trial protocol) per Cardiology; need to transition to low dose lovenox given renal dysfunction currently - Echo pending - On Xarelto BID> with , hold for now pending neuroimaging. If negative will need to start  heparin infusion.  -tele monitoring  Substance abuse History per EMR: UDS only positive for THC. Likely only short-term use of opiates from recent foot fracture. - Watch for withdrawal symptoms.    Best practice (right click and "Reselect all SmartList Selections" daily)  Diet:  Tube Feed  Pain/Anxiety/Delirium protocol (if indicated): Yes (RASS goal 0) VAP protocol (if indicated): Yes DVT prophylaxis: LMWH GI prophylaxis: PPI Glucose control:  SSI Yes and Basal insulin Yes Central venous access:  N/A Arterial line:  N/A Foley:  Yes, and it is still needed Mobility:  bed rest  PT consulted: N/A Last date of multidisciplinary goals of care discussion [TBD] Code Status:  full code Disposition: ICU   Labs   CBC: Recent Labs  Lab 06/02/2020 0333 06/08/2020 0409 06/15/2020 0758 06/01/20 1024 06/02/20 0118  WBC 15.1*  --  14.7* 10.0 9.9  NEUTROABS 13.3*  --   --   --   --   HGB 10.4* 9.2* 9.5* 9.5* 8.0*  HCT 32.2* 27.0* 29.9* 29.8* 25.0*  MCV 82.1  --  81.5 81.6 80.6  PLT 195  --  173 154 136*    Basic Metabolic Panel: Recent Labs  Lab 06/06/2020 0333 05/23/2020 0409 05/27/2020 0758 06/01/20 1024 06/02/20 0118  NA 138 138 137 137 137  K 4.2 4.3 4.5 4.3 4.4  CL 106  --  106 106 106  CO2 22  --  _0 GLUCOSE 202*  --  194* 230* 220*  BUN 45*  --  46* 57* 72*  CREATININE 2.81*  --  2.96* 3.05* 3.19*  CALCIUM 9.2  --  8.9 8.5* 8.0*  MG 2.1  --  2.1  --   --   PHOS 4.8*  --   --   --   --    GFR: Estimated Creatinine Clearance: 26.7 mL/min (A) (by C-G formula based on SCr of 3.19 mg/dL (H)). Recent Labs  Lab 05/30/2020 0333 06/17/2020 0758 05/24/2020 1300 06/20/2020 1520 06/01/20 1024 06/02/20 0118  PROCALCITON 3.38  --   --   --   --   --   WBC 15.1* 14.7*  --   --  10.0 9.9  LATICACIDVEN 3.1* 2.8* 2.2* 2.3*  --   --     Liver Function Tests: Recent Labs  Lab 06/06/2020 0333  AST 122*  ALT 107*  ALKPHOS 118  BILITOT 0.7  PROT 5.2*  ALBUMIN 2.2*   Recent  Labs  Lab 06/09/2020 0333  LIPASE 28  AMYLASE 152*   No results for input(s): AMMONIA in the last 168 hours.  ABG  Component Value Date/Time   PHART 7.347 (L) 06/15/2020 0409   PCO2ART 43.9 06/11/2020 0409   PO2ART 113 (H) 06/15/2020 0409   HCO3 24.1 05/22/2020 0409   TCO2 25 05/24/2020 0409   ACIDBASEDEF 2.0 06/11/2020 0409   O2SAT 98.0 06/17/2020 0409     Coagulation Profile: No results for input(s): INR, PROTIME in the last 168 hours.  Cardiac Enzymes: No results for input(s): CKTOTAL, CKMB, CKMBINDEX, TROPONINI in the last 168 hours.  HbA1C: HB A1C (BAYER DCA - WAIVED)  Date/Time Value Ref Range Status  12/09/2019 01:10 PM 7.8 (H) <7.0 % Final    Comment:                                          Diabetic Adult            <7.0                                       Healthy Adult        4.3 - 5.7                                                           (DCCT/NGSP) American Diabetes Association's Summary of Glycemic Recommendations for Adults with Diabetes: Hemoglobin A1c <7.0%. More stringent glycemic goals (A1c <6.0%) may further reduce complications at the cost of increased risk of hypoglycemia.   09/03/2019 12:51 PM >14.0 (H) <7.0 % Final    Comment:                                          Diabetic Adult            <7.0                                       Healthy Adult        4.3 - 5.7                                                           (DCCT/NGSP) American Diabetes Association's Summary of Glycemic Recommendations for Adults with Diabetes: Hemoglobin A1c <7.0%. More stringent glycemic goals (A1c <6.0%) may further reduce complications at the cost of increased risk of hypoglycemia.    Hgb A1c MFr Bld  Date/Time Value Ref Range Status  05/29/2020 03:33 AM 15.4 (H) 4.8 - 5.6 % Final    Comment:    (NOTE)         Prediabetes: 5.7 - 6.4         Diabetes: >6.4         Glycemic control for adults with diabetes: <7.0   02/01/2020 06:48 PM 8.1 (H) 4.8  - 5.6 % Final    Comment:    (  NOTE) Pre diabetes:          5.7%-6.4%  Diabetes:              >6.4%  Glycemic control for   <7.0% adults with diabetes     CBG: Recent Labs  Lab 06/01/20 1515 06/01/20 1929 06/01/20 2325 06/02/20 0323 06/02/20 0738  GLUCAP 181* 198* 204* 199* 211*    Review of Systems:     Past Medical History:  He,  has a past medical history of CAD (coronary artery disease), Chronic back pain, CKD (chronic kidney disease), stage IV (HCC), Diverticulosis, Essential hypertension, GERD (gastroesophageal reflux disease), Hyperlipidemia, ICD (implantable cardioverter-defibrillator) in place, Onychomycosis, Polysubstance abuse (HCC), Secondary cardiomyopathy (HCC), and Type 2 diabetes mellitus (HCC).   Surgical History:   Past Surgical History:  Procedure Laterality Date  . APPENDECTOMY    . CARDIAC CATHETERIZATION  2005   4 frech cath  . COLONOSCOPY N/A 08/01/2013   Procedure: COLONOSCOPY;  Surgeon: Najeeb U Rehman, MD;  Location: [facility];  Service: Endoscopy;  Laterality: N/A;  rescheduled to 1200 Ann notified pt  . CORONARY STENT INTERVENTION N/A 03/19/2018   Procedure: CORONARY STENT INTERVENTION;  Surgeon: Harding, David W, MD;  Location: [facility];  Service: Cardiovascular;  Laterality: N/A;  . ESOPHAGOGASTRODUODENOSCOPY N/A 04/23/2015   Procedure: ESOPHAGOGASTRODUODENOSCOPY (EGD);  Surgeon: Najeeb U Rehman, MD;  Location: [facility];  Service: Endoscopy;  Laterality: N/A;  1:25  . RIGHT/LEFT HEART CATH AND CORONARY ANGIOGRAPHY N/A 06/03/2016   Procedure: Right/Left Heart Cath and Coronary Angiography;  Surgeon: Henry W Smith, MD;  Location: [facility];  Service: Cardiovascular;  Laterality: N/A;  . RIGHT/LEFT HEART CATH AND CORONARY ANGIOGRAPHY N/A 03/19/2018   Procedure: RIGHT/LEFT HEART CATH AND CORONARY ANGIOGRAPHY;  Surgeon: Bensimhon, Daniel R, MD;  Location: [facility];  Service: Cardiovascular;  Laterality: N/A;  . SUBQ  ICD IMPLANT N/A 05/27/2019   Procedure: SUBQ ICD IMPLANT;  Surgeon: Taylor, Gregg W, MD;  Location: [facility];  Service: Cardiovascular;  Laterality: N/A;     Social History:   reports that he has never smoked. His smokeless tobacco use includes snuff. He reports current alcohol use of about 1.0 standard drink of alcohol per week. He reports current drug use. Drug: Marijuana.   Family History:  His family history includes Alcohol abuse in his brother; Diabetes in his father; Hypertension in his father; Lung cancer in his mother. There is no history of Colon cancer.   Allergies Allergies  Allergen Reactions  . Sulfa Antibiotics Shortness Of Breath  . Penicillins Rash and Other (See Comments)    Has patient had a PCN reaction causing immediate rash, facial/tongue/throat swelling, SOB or lightheadedness with hypotension: No Has patient had a PCN reaction causing severe rash involving mucus membranes or skin necrosis: No Has patient had a PCN reaction that required hospitalization No Has patient had a PCN reaction occurring within the last 10 years: No If all of the above answers are "NO", then may proceed with Cephalosporin use.      Home Medications  Prior to Admission medications   Medication Sig Start Date End Date Taking? Authorizing Provider  aspirin EC 81 MG tablet Take 81 mg by mouth daily.   Yes [provider]  carvedilol (COREG) 6.25 MG tablet TAKE 1 AND 1/2 TABLET BY MOUTH TWICE DAILY. Patient taking differently: Take 9.375 mg by mouth 2 (two) times daily with a meal. 04/22/20  Yes McLean,   Dalton S, MD  fluticasone (FLONASE) 50 MCG/ACT nasal spray INSTILL 2 SPRAYS INTO EACH NOSTRIL DAILY. Patient taking differently: Place 2 sprays into both nostrils daily as needed for allergies. 01/20/20  Yes Stacks, Warren, MD  hydrALAZINE (APRESOLINE) 100 MG tablet TAKE (1) TABLET BY MOUTH (3) TIMES DAILY. Patient taking differently: Take 100 mg by mouth 3 (three) times daily.  12/23/19  Yes Stacks, Warren, MD  insulin glargine, 2 Unit Dial, (TOUJEO MAX SOLOSTAR) 300 UNIT/ML Solostar Pen Inject 10 Units into the skin at bedtime. Patient taking differently: Inject 40-50 Units into the skin See admin instructions. Inject 50 units into the skin in the morning before breakfast and inject 40 units into the skin in the evening before supper. 02/07/20  Yes Tat, David, MD  isosorbide mononitrate (IMDUR) 120 MG 24 hr tablet TAKE (1) TABLET BY MOUTH ONCE DAILY. Patient taking differently: Take 120 mg by mouth daily. 12/23/19  Yes Stacks, Warren, MD  nitroGLYCERIN (NITROSTAT) 0.4 MG SL tablet DISSOLVE 1 TAB UNDER TOUNGE FOR CHEST PAIN. MAY REPEAT EVERY 5 MINUTES FOR 3 DOSES. IF NO RELIEF CALL 911 OR GO TO ER Patient taking differently: Place 0.4 mg under the tongue every 5 (five) minutes x 3 doses as needed for chest pain. 09/20/18  Yes Stacks, Warren, MD  pantoprazole (PROTONIX) 40 MG tablet Take 1 tablet (40 mg total) by mouth daily. 07/11/19  Yes Stacks, Warren, MD  potassium chloride SA (KLOR-CON) 20 MEQ tablet Take 20 mEq by mouth daily. 05/22/20  Yes [provider]  PROAIR HFA 108 (90 Base) MCG/ACT inhaler USE 2 PUFFS EVERY 6 HOURS AS NEEDED Patient taking differently: Inhale 2 puffs into the lungs every 6 (six) hours as needed for wheezing or shortness of breath. 11/11/19  Yes Stacks, Warren, MD  ranolazine (RANEXA) 500 MG 12 hr tablet TAKE 1 TABLET BY MOUTH TWICE DAILY. Patient taking differently: Take 500 mg by mouth 2 (two) times daily. 06/24/19  Yes McLean, Dalton S, MD  rosuvastatin (CRESTOR) 10 MG tablet TAKE 1 TABLET BY MOUTH ONCE A DAY. Patient taking differently: Take 10 mg by mouth daily. 12/23/19  Yes Stacks, Warren, MD  Semaglutide, 1 MG/DOSE, (OZEMPIC, 1 MG/DOSE,) 2 MG/1.5ML SOPN Inject 1 mg into the skin once a week. 12/09/19  Yes Stacks, Warren, MD  spironolactone (ALDACTONE) 25 MG tablet Take 25 mg by mouth 2 (two) times daily. 03/19/20  Yes [provider]  torsemide (DEMADEX) 20 MG tablet Take 3 tablets (60 mg total) by mouth 2 (two) times daily. Patient taking differently: Take 80 mg by mouth 2 (two) times daily. 02/07/20  Yes Tat, David, MD  XARELTO 2.5 MG TABS tablet TAKE (1) TABLET BY MOUTH TWICE DAILY. Patient taking differently: Take 2.5 mg by mouth 2 (two) times daily. 09/27/19  Yes Taylor, Gregg W, MD  blood glucose meter kit and supplies KIT Dispense based on patient and insurance preference. Test four times daily as directed. (FOR ICD-10 : E11.9 03/21/19   Stacks, Warren, MD  Blood Glucose Monitoring Suppl (ONETOUCH VERIO) w/Device KIT 1 Device by Does not apply route 2 (two) times daily. 04/01/19   Joyce, Britney F, FNP  Continuous Blood Gluc Sensor (FREESTYLE LIBRE 2 SENSOR) MISC Use to test blood sugar up to 6 times daily as directed. E11.9 09/23/19   Stacks, Warren, MD  glucose blood (ONETOUCH VERIO) test strip USE FOUR TIMES DAILY AS NEEDED Dx E11.9 08/30/19   Stacks, Warren, MD  HYDROcodone-acetaminophen (NORCO/VICODIN) 5-325 MG tablet Take 1   tablet by mouth every 6 (six) hours as needed for moderate pain. Patient not taking: No sig reported 04/30/20   Harrison, Stanley E, MD  icosapent Ethyl (VASCEPA) 1 g capsule Take 2 capsules (2 g total) by mouth 2 (two) times daily. Patient not taking: No sig reported 02/04/19   McLean, Dalton S, MD  Lancets (ONETOUCH ULTRASOFT) lancets Use as instructed   DX E11.9 08/30/19   Stacks, Warren, MD  loratadine (CLARITIN) 10 MG tablet Take 1 tablet (10 mg total) by mouth daily. Patient not taking: No sig reported 04/12/19   Gottschalk, Ashly M, DO  oxyCODONE-acetaminophen (PERCOCET/ROXICET) 5-325 MG tablet Take 1 tablet by mouth every 4 (four) hours as needed for severe pain. Patient not taking: No sig reported 04/17/20   Harris, Abigail, PA-C  pregabalin (LYRICA) 200 MG capsule Take 2 capsules (400 mg total) by mouth at bedtime. Patient not taking: No sig reported 03/18/19   Stacks, Warren, MD  promethazine  (PHENERGAN) 12.5 MG tablet Take 1 tablet (12.5 mg total) by mouth every 6 (six) hours as needed for nausea or vomiting. Patient not taking: No sig reported 05/18/20   Harrison, Stanley E, MD  terbinafine (LAMISIL) 1 % cream Apply 1 application topically 2 (two) times daily. Apply to both feet and between toes Patient not taking: No sig reported 04/10/18   Stacks, Warren, MD  tiZANidine (ZANAFLEX) 4 MG tablet Take 1 tablet (4 mg total) by mouth every 6 (six) hours as needed for muscle spasms. Patient not taking: No sig reported 09/03/19   Stacks, Warren, MD  Vitamin D, Ergocalciferol, (DRISDOL) 1.25 MG (50000 UNIT) CAPS capsule Take 1 capsule (50,000 Units total) by mouth every 7 (seven) days. Patient not taking: No sig reported 12/10/19 12/08/20  Stacks, Warren, MD     Nick Brazeau, MS4   Pulmonary critical care attending:  65 yo admitted s/p cardiac arrest, presumed PEA, AHRF on MV, possible seizure, HHS, hyperglycemia  MRI completed. No stroke   BP (!) 132/59 (BP Location: Right Arm)   Pulse 87   Temp (!) 101.3 F (38.5 C) (Oral)   Resp 14   Ht 5' 9" (1.753 m)   Wt 95.7 kg   SpO2 98%   BMI 31.16 kg/m   Gen: elderly male, intubated on life support,  HENT: ETT in place Heart: RRR, s1 s2  Lungs: BL vented breaths  Abd: soft NT ND   Labs reviewed:  SCR 3.19 Hgb 8.0 PLT 136  A:  Cardiac Arrest  Baseline Cardiomyopathy EF 20%  CAD AHRF on MV Aspiration pneumonitis  Possible seizure  Acute metabolic encephalopathy  HHS  Fevers, possible sepsis  Foley present on admission   P: Continue insulin regimen PAD guideline sedation  Stop versed precedex if needed  Wean sedation  Attempt SAT SBT once able  Mental status precludes at this time Start ceftriaxone Started daily lasix 20mg   This patient is critically ill with multiple organ system failure; which, requires frequent high complexity decision making, assessment, support, evaluation, and titration of therapies.  This was completed through the application of advanced monitoring technologies and extensive interpretation of multiple databases. During this encounter critical care time was devoted to patient care services described in this note for 32 minutes.   L , DO  Pulmonary Critical Care 06/02/2020 4:44 PM        

## 2020-06-02 NOTE — Progress Notes (Signed)
[facility] Progress Note Patient Name: Brad Mendoza DOB: 1955/06/16 MRN: [id number]   Date of Service  06/02/2020  HPI/Events of Note  Patient with sub-optimal sedation on Versed infusion with PRN Fentanyl iv pushes.  eICU Interventions  Patient started on a Fentanyl infusion to optimize sedation.        Deidra Spease U Cortez Steelman 06/02/2020, 3:20 AM

## 2020-06-02 NOTE — Progress Notes (Signed)
Inpatient Diabetes Program Recommendations  AACE/ADA: New Consensus Statement on Inpatient Glycemic Control   Target Ranges:  Prepandial:   less than 140 mg/dL      Peak postprandial:   less than 180 mg/dL (1-2 hours)      Critically ill patients:  140 - 180 mg/dL   Results for DEMARIO, FANIEL (MRN [id number]) as of 06/02/2020 10:32  Ref. Range 06/01/2020 07:43 06/01/2020 12:34 06/01/2020 15:15 06/01/2020 19:29 06/01/2020 23:25 06/02/2020 03:23 06/02/2020 07:38  Glucose-Capillary Latest Ref Range: 70 - 99 mg/dL 185 (H) 195 (H) 181 (H) 198 (H) 204 (H) 199 (H) 211 (H)   Review of Glycemic Control  Diabetes history: DM2 Outpatient Diabetes medications: Toujeo 10 units QHS (per home med list pt is taking 50 units QAM and 40 units QPM), Ozempic 1 mg Qweek Current orders for Inpatient glycemic control: Lantus 15 units daily, Novolog 0-20 units Q4H, Novolog 4 units Q4H; Vital @ 55 ml/hr  Inpatient Diabetes Program Recommendations:    Insulin: Noted Novolog 4 units Q4H for tube feeding coverage ordered today.   Thanks, Barnie Alderman, RN, MSN, CDE Diabetes Coordinator Inpatient Diabetes Program [phone] (Team Pager from 8am to 5pm)

## 2020-06-03 ENCOUNTER — Inpatient Hospital Stay (HOSPITAL_COMMUNITY): Payer: Medicare Other

## 2020-06-03 DIAGNOSIS — Z452 Encounter for adjustment and management of vascular access device: Secondary | ICD-10-CM

## 2020-06-03 DIAGNOSIS — I469 Cardiac arrest, cause unspecified: Secondary | ICD-10-CM | POA: Diagnosis not present

## 2020-06-03 DIAGNOSIS — N184 Chronic kidney disease, stage 4 (severe): Secondary | ICD-10-CM | POA: Diagnosis not present

## 2020-06-03 DIAGNOSIS — J9601 Acute respiratory failure with hypoxia: Secondary | ICD-10-CM | POA: Diagnosis not present

## 2020-06-03 DIAGNOSIS — E872 Acidosis: Secondary | ICD-10-CM

## 2020-06-03 DIAGNOSIS — G931 Anoxic brain damage, not elsewhere classified: Secondary | ICD-10-CM | POA: Diagnosis not present

## 2020-06-03 LAB — HEMOGLOBIN AND HEMATOCRIT, BLOOD
HCT: 25.6 % — ABNORMAL LOW (ref 39.0–52.0)
Hemoglobin: 7.8 g/dL — ABNORMAL LOW (ref 13.0–17.0)

## 2020-06-03 LAB — CBC
HCT: 24.7 % — ABNORMAL LOW (ref 39.0–52.0)
Hemoglobin: 7.8 g/dL — ABNORMAL LOW (ref 13.0–17.0)
MCH: 26.1 pg (ref 26.0–34.0)
MCHC: 31.6 g/dL (ref 30.0–36.0)
MCV: 82.6 fL (ref 80.0–100.0)
Platelets: 117 10*3/uL — ABNORMAL LOW (ref 150–400)
RBC: 2.99 MIL/uL — ABNORMAL LOW (ref 4.22–5.81)
RDW: 15.9 % — ABNORMAL HIGH (ref 11.5–15.5)
WBC: 11.5 10*3/uL — ABNORMAL HIGH (ref 4.0–10.5)
nRBC: 0.2 % (ref 0.0–0.2)

## 2020-06-03 LAB — GLUCOSE, CAPILLARY
Glucose-Capillary: 154 mg/dL — ABNORMAL HIGH (ref 70–99)
Glucose-Capillary: 204 mg/dL — ABNORMAL HIGH (ref 70–99)
Glucose-Capillary: 242 mg/dL — ABNORMAL HIGH (ref 70–99)
Glucose-Capillary: 165 mg/dL — ABNORMAL HIGH (ref 70–99)
Glucose-Capillary: 152 mg/dL — ABNORMAL HIGH (ref 70–99)
Glucose-Capillary: 130 mg/dL — ABNORMAL HIGH (ref 70–99)

## 2020-06-03 LAB — COMPREHENSIVE METABOLIC PANEL
ALT: 37 U/L (ref 0–44)
AST: 22 U/L (ref 15–41)
Albumin: 1.6 g/dL — ABNORMAL LOW (ref 3.5–5.0)
Alkaline Phosphatase: 118 U/L (ref 38–126)
Anion gap: 10 (ref 5–15)
BUN: 95 mg/dL — ABNORMAL HIGH (ref 8–23)
CO2: 23 mmol/L (ref 22–32)
Calcium: 8.1 mg/dL — ABNORMAL LOW (ref 8.9–10.3)
Chloride: 106 mmol/L (ref 98–111)
Creatinine, Ser: 3.84 mg/dL — ABNORMAL HIGH (ref 0.61–1.24)
GFR, Estimated: 17 mL/min — ABNORMAL LOW (ref >60)
Glucose, Bld: 163 mg/dL — ABNORMAL HIGH (ref 70–99)
Potassium: 5.3 mmol/L — ABNORMAL HIGH (ref 3.5–5.1)
Sodium: 139 mmol/L (ref 135–145)
Total Bilirubin: 0.7 mg/dL (ref 0.3–1.2)
Total Protein: 5 g/dL — ABNORMAL LOW (ref 6.5–8.1)

## 2020-06-03 LAB — BASIC METABOLIC PANEL
Anion gap: 8 (ref 5–15)
BUN: 100 mg/dL — ABNORMAL HIGH (ref 8–23)
CO2: 25 mmol/L (ref 22–32)
Calcium: 8.1 mg/dL — ABNORMAL LOW (ref 8.9–10.3)
Chloride: 107 mmol/L (ref 98–111)
Creatinine, Ser: 3.7 mg/dL — ABNORMAL HIGH (ref 0.61–1.24)
GFR, Estimated: 17 mL/min — ABNORMAL LOW (ref >60)
Glucose, Bld: 255 mg/dL — ABNORMAL HIGH (ref 70–99)
Potassium: 5.1 mmol/L (ref 3.5–5.1)
Sodium: 140 mmol/L (ref 135–145)

## 2020-06-03 LAB — COOXEMETRY PANEL
Carboxyhemoglobin: 1.3 % (ref 0.5–1.5)
Methemoglobin: 1 % (ref 0.0–1.5)
O2 Saturation: 72.6 %
Total hemoglobin: 8.5 g/dL — ABNORMAL LOW (ref 12.0–16.0)

## 2020-06-03 LAB — CREATININE, URINE, RANDOM: Creatinine, Urine: 67.85 mg/dL

## 2020-06-03 LAB — PHOSPHORUS: Phosphorus: 5.8 mg/dL — ABNORMAL HIGH (ref 2.5–4.6)

## 2020-06-03 LAB — ABO/RH: ABO/RH(D): O POS

## 2020-06-03 LAB — PROTIME-INR
INR: 1.2 (ref 0.8–1.2)
Prothrombin Time: 14.9 seconds (ref 11.4–15.2)

## 2020-06-03 LAB — CK: Total CK: 175 U/L (ref 49–397)

## 2020-06-03 LAB — PREPARE RBC (CROSSMATCH)

## 2020-06-03 LAB — SODIUM, URINE, RANDOM: Sodium, Ur: 73 mmol/L

## 2020-06-03 MED ORDER — FUROSEMIDE 10 MG/ML IJ SOLN
120.0000 mg | Freq: Once | INTRAVENOUS | Status: AC
  Administered 2020-06-03: 120 mg via INTRAVENOUS
  Filled 2020-06-03: qty 10

## 2020-06-03 MED ORDER — HEPARIN SOD (PORK) LOCK FLUSH 100 UNIT/ML IV SOLN
500.0000 [IU] | Freq: Once | INTRAVENOUS | Status: DC
  Filled 2020-06-03: qty 15

## 2020-06-03 MED ORDER — SODIUM ZIRCONIUM CYCLOSILICATE 10 G PO PACK
10.0000 g | PACK | Freq: Once | ORAL | Status: AC
  Administered 2020-06-03: 10 g
  Filled 2020-06-03: qty 1

## 2020-06-03 MED ORDER — SODIUM CHLORIDE 0.9% IV SOLUTION: Freq: Once | INTRAVENOUS | Status: DC

## 2020-06-03 MED ORDER — HEPARIN SODIUM (PORCINE) 1000 UNIT/ML IJ SOLN
1000.0000 [IU] | Freq: Once | INTRAMUSCULAR | Status: AC
  Administered 2020-06-03: 2400 [IU] via INTRAVENOUS
  Filled 2020-06-03: qty 6
  Filled 2020-06-03: qty 3

## 2020-06-03 NOTE — Procedures (Signed)
Central Venous Catheter Insertion Procedure Note  Brad Mendoza  [id number]  03-05-1955  Date:06/03/20  Time:12:05 PM   Provider Performing:Jhanae Jaskowiak Jerilynn Mages Ayesha Rumpf   Procedure: Insertion of Non-tunneled Central Venous Catheter(36556)with US guidance (80165)    Indication(s) Medication administration, Difficult access and Hemodialysis  Consent Risks of the procedure as well as the alternatives and risks of each were explained to the patient and/or caregiver.  Consent for the procedure was obtained and is signed in the bedside chart  Anesthesia Topical only with 1% lidocaine   Timeout Verified patient identification, verified procedure, site/side was marked, verified correct patient position, special equipment/implants available, medications/allergies/relevant history reviewed, required imaging and test results available.  Sterile Technique Maximal sterile technique including full sterile barrier drape, hand hygiene, sterile gown, sterile gloves, mask, hair covering, sterile ultrasound probe cover (if used).     Procedure Description Area of catheter insertion was cleaned with chlorhexidine and draped in sterile fashion.   With real-time ultrasound guidance a HD catheter was placed into the right internal jugular vein.  Nonpulsatile blood flow and easy flushing noted in all ports.  The catheter was sutured in place and sterile dressing applied.  Complications/Tolerance None; patient tolerated the procedure well. Chest X-ray is ordered to verify placement for internal jugular or subclavian cannulation.   EBL Minimal  Specimen(s) None  The entire procedure was supervised/proctored by Eliseo Gum, NP.  Brad Mount, PA-C [facility] 06/03/20 12:06 PM  Please see [email or web address] for pager details.

## 2020-06-03 NOTE — Progress Notes (Signed)
[facility] Progress Note Patient Name: Brad Mendoza DOB: 09-Apr-1955 MRN: [id number]   Date of Service  06/03/2020  HPI/Events of Note  Hyperkalemia = K+ = 5.3.  eICU Interventions  Plan: 1. Lokelma 10 gm per tube now.  2. Repeat BMP at 10 AM.     Intervention Category Major Interventions: Electrolyte abnormality - evaluation and management  Hakop Humbarger [place] 06/03/2020, 3:55 AM

## 2020-06-03 NOTE — Progress Notes (Addendum)
NAME:  Brad Mendoza, MRN:  [id number], DOB:  23-Apr-1955, LOS: 3 ADMISSION DATE:  06/09/2020, CONSULTATION DATE:  4/10  REFERRING MD:  Dr. Otelia Limes, CHIEF COMPLAINT:  Cardiac Arrest    History of Present Illness:  Patient is intubated and sedated; therefore history has been obtained from chart review.  Brad Mendoza is a 65 y.o. 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 Patient presented to the [facility] ED on 4/9 with complaints of seizure-like activity and decreased level of consciousness: last normal 1630 with family finding him w/ slurred speech at 1700. As the patient was being transferred into the ambulance he developed agonal respirations and suffered a generalized tonic-clonic seizure. Seizure aborted w/ Versed but patient did wake up from his postictal phase and was bagged in route to the ED. Upon arrival to the ED he was found to be pulseless and underwent CPR. ROSC after 3-4 rounds, non-shockable rhythm - Epi x3, amiodarone, calcium. Pt had copies projectile emesis after ROSC; agonal respiration; OG dropped before, suctioned, then uncomplicated intubation Non-sustained VT afterwards, followed by sinus tach. Initial BPs with systolic in the 562B. Head CT at OSH showed no acute processes. Telemetry neurology was involved and recommended a CTA; however, was not preformed 2/2 CKD/elevated serum creatinine. [facility] did not have MRI capabilities and the patient was referred for transfer to [facility]. MRI on 4/11 did not indicate acute ischemic or hemorrhagic process and was not c/w anoxic brain injury.    Pertinent  Medical History  CAD s/p DES RCA, HFrEF s/p ICD CKD IV HTN, HLD, T2DM GERD Chronic back pain  Significant Hospital Events: Including procedures, antibiotic start and stop dates in addition to  other pertinent events    4/9 Sz, cardiac arrest  4/10 tx to [facility] care  4/10 nonspecific EEG  4/11 largely unremarkable MRI/MRA  Interim History / Subjective:  NAEON. Patient remains intubated. Requiring sedation for ventilator synchrony. Decreased urine output. Afebrile since approximately 1700 yesterday.    Objective   Blood pressure (!) 107/56, pulse 65, temperature 99.5 F (37.5 C), temperature source Oral, resp. rate 12, height 5' 9"  (1.753 m), weight 96.1 kg, SpO2 100 %.    Vent Mode: PRVC FiO2 (%):  [40 %] 40 % Set Rate:  [12 bmp] 12 bmp Vt Set:  [560 mL] 560 mL PEEP:  [5 cmH20] 5 cmH20 Pressure Support:  [5 cmH20] 5 cmH20 Plateau Pressure:  [14 cmH20-19 cmH20] 17 cmH20   Intake/Output Summary (Last 24 hours) at 06/03/2020 0853 Last data filed at 06/03/2020 0800 Gross per 24 hour  Intake 1884.56 ml  Output 500 ml  Net 1384.56 ml   Filed Weights   06/01/20 0333 06/02/20 0337 06/03/20 0427  Weight: 95.1 kg 95.7 kg 96.1 kg    Examination: General: chronically ill appearing, NAD, no AV fistula site  HENT: MMM, ETT in place, OGT in place  Lungs: CTAB with some bilateral coarse sounds in anterior [place]  Cardiovascular: soft s1/s2, no m/r/g, no JVD, negative hepatojugular reflex, 2+ peripheral pulses, trace edema Abdomen: soft, non-distended Extremities: no rashes noted, skin dry  Neuro: pupils approx 60m, sluggish, reactive GU: foley in place   Labs/imaging that I havepersonally reviewed  (right click and "Reselect all SmartList Selections" daily)  CBC BMP   Resolved Hospital Problem  list   N/A  Assessment & Plan:   LARENCE Mendoza is a 65 y.o. male with a relevant PMHx of CAD s/p RCA DES (02/2018), HFrEF s/p ICD (EF~20-30%), CKD IV, T2DM c/b poor glycemic control (12/21 A1c: 8.1 > 05/22/2020 15.4), and HTN a/f seizure-like activity, AMS followed by cardiac arrest. Patient presentation c/f CVA or anoxic encephalopathy but head CT and MRI/MRA  demonstrated no acute processes or diffuse injuries. Pt was found to have a BG in the 800s and an A1c of 15.4, which makes a provoked seizure 2/2 HHS a likely etiology given non-specific EEG and unremarkable imaging results. Patient remains critically ill with unknown neurologic status. Will need to attempt extubation and establish neurologic function.  Cardiac Arrest  Known history of coronary disease and chronic HFrEF increases his risk for sudden cardiac arrest. Cardiac arrest likely 2/2 to seizure (per above). Will need to assess neurologic function following extubation.  - MRI/MRA largely unremarkable - no evidence of CVA or anoxic brain injury  - Echo demonstrated EF 90-30%, LVH, diastolic dysfxn; atrial septum normal  - EEG nonspecific findings (below) - Trop downtrended - Hold home xarelto  Acute hypoxemic respiratory failure Poor airway protection post code. Required intubation. Will attempt to wean per RT. -LTVV, 4-8 cc/kg ideal body weight goal plateau's and 30 driving pressure less than 15. - Daily WUA/SBT - VAP prevention bundle -Pad protocol for sedation-fentanyl and versed titrated to RASS 0  C/f Aspiratoin PNA vs Pneumonitis Following ROSC patient had emesis c/f aspiration. Steadily increasing fever that started overnight in setting of net downtrending WBC (14.7 > 10 > 9.9 > 11.5).  Less likely to be VAP given timeframe.  - Repeat CXR (pending) - PCN allergy, so ceftriaxone in place of Unasyn for potential aspiration PNA      - Inadequate anerobic coverage but no evidence of abscess, so per IDSA will not add metronidazole at this time - 3/12 CXR exhibits improved RML infiltrate    AKI on CKD IV Worsening renal function with increased Cr, BUN, and hyperkalemia overnight. Decreased urine output. May reflect cardiorenal syndrome as well as renal congestion. No evidence of hypervolemia by JVD or peripheral edema.   - Cr: 2.81 > 2.96 > 3.05 > 3.19 > 3.84 - Continue monitoring  renal function     - Recent baseline Cr approx 2.5 - 2.8  - Lasix 120 mg today  - Hold Aldactone - As above, hold Xarelto, may need to transition to Eliquis before discharge  Micro-normocytic Anemia  Thrombocytopenia  Decreased Hemoglobin  Hemoglobin decreased from 9.5 > 9.5 > 8 > 7.8 with downtrending platelets 173 > 154 > 136 > 117 but a relatively stable WBC 14.7 > 10 > 9.9 > 11.5. MCV approx low 80s. No evidence of acute bleed. On PPI for gastric ulcer ppx. On lovenox for VTE ppx.  - CTM for occult bleed - Consider transfusion Hb < 8 given CAD   Acute encephalopathy Evidence of slurred speech and AMS prior to arrest. Given MRI/MRA most likely dx is metabolic derangement. Have been unable to extubate patient or get significant clinical response (but has remained sedated).   - brain MRI as above, w/out evidence   -EEG as above, nonspecific - OSH CT w/out evidence of acute abnml  Seizure preceded by acute encephalopathy  Pre-cardiac arrest. Likely provoked seizure 2/2 uncontrolled hyperglycemia. UA negative for ketones. No significant PTA ETOH use. UDS +TCH. No seizure-contributing meds, but his children are unsure what pain med  he has been taking for his leg (concern for possible Ultram use).  - Keppra given in ED     - Continue to hold ppx for now unless seizure activity resumes  - EEG demonstrated severe generalized slowing      - Nonspecific pattern c/w toxic, metabolic, or other abnml  - MRI largely unremarkable   IDDM c/f HHS Poorly controlled per wife/children (A1c 8.1 in 01/2020): has had very high BG since his foot fracture. Per above, likely culprit for provoke seizure.  -  A1c 15.4 (vs 8.1 in 01/2020)  - basal bolus insulin     - increase basal insulin for better control  -unclear what home insulin regimen is based on prescription       - Need to optimize  -hold PTA semaglutide  Transaminitis Prior elevated LFTs in setting of normal Tbili. Likely 2/2 ischemic insult  from cardiac arrest. LFTs normalized on most recent CMP.   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 CAD s/p DES to RCA 02/2018 -Restart statin, aspirin.  -Hold Ranexa for now. -On Xarelto at home for secondary CAD prevention (Compass trial protocol) per Cardiology; need to transition to low dose lovenox given renal dysfunction currently - Echo pending - On Xarelto BID> with , hold for now pending neuroimaging. If negative will need to start heparin infusion.  -tele monitoring  Substance abuse History per EMR: UDS only positive for THC. Likely only short-term use of opiates from recent foot fracture. - Watch for withdrawal symptoms.    Best practice (right click and "Reselect all SmartList Selections" daily)  Diet:  Tube Feed  Pain/Anxiety/Delirium protocol (if indicated): Yes (RASS goal 0) VAP protocol (if indicated): Yes DVT prophylaxis: LMWH GI prophylaxis: PPI Glucose control:  SSI Yes and Basal insulin Yes Central venous access:  N/A Arterial line:  N/A Foley:  Yes, and it is still needed Mobility:  bed rest  PT consulted: N/A Last date of multidisciplinary goals of care discussion [TBD] Code Status:  full code Disposition: ICU   Labs   CBC: Recent Labs  Lab 06/20/2020 0333 06/01/2020 0409 06/20/2020 0758 06/01/20 1024 06/02/20 0118 06/02/20 1702 06/03/20 0054  WBC 15.1*  --  14.7* 10.0 9.9  --  11.5*  NEUTROABS 13.3*  --   --   --   --   --   --   HGB 10.4*   < > 9.5* 9.5* 8.0* 7.9* 7.8*  HCT 32.2*   < > 29.9* 29.8* 25.0* 25.1* 24.7*  MCV 82.1  --  81.5 81.6 80.6  --  82.6  PLT 195  --  173 154 136*  --  117*   < > = values in this interval not displayed.    Basic Metabolic Panel: Recent Labs  Lab 06/15/2020 0333 05/28/2020 0409 06/15/2020 0758 06/01/20 1024 06/02/20 0118 06/03/20 0054  NA 138 138 137 137 137 139  K 4.2 4.3 4.5 4.3 4.4 5.3*  CL 106  --  106 106 106 106  CO2 22  --  24 23 23  23   GLUCOSE 202*  --  194* 230* 220* 163*  BUN 45*  --  46* 57* 72* 95*  CREATININE 2.81*  --  2.96* 3.05* 3.19* 3.84*  CALCIUM 9.2  --  8.9 8.5* 8.0* 8.1*  MG 2.1  --  2.1  --   --   --   PHOS 4.8*  --   --   --   --   --  GFR: Estimated Creatinine Clearance: 22.2 mL/min (A) (by C-G formula based on SCr of 3.84 mg/dL (H)). Recent Labs  Lab 05/25/2020 0333 06/08/2020 0758 05/29/2020 1300 06/05/2020 1520 06/01/20 1024 06/02/20 0118 06/03/20 0054  PROCALCITON 3.38  --   --   --   --   --   --   WBC 15.1* 14.7*  --   --  10.0 9.9 11.5*  LATICACIDVEN 3.1* 2.8* 2.2* 2.3*  --   --   --     Liver Function Tests: Recent Labs  Lab 05/23/2020 0333 06/02/20 1702 06/03/20 0054  AST 122* 20 22  ALT 107* 39 37  ALKPHOS 118 118 118  BILITOT 0.7 0.8 0.7  PROT 5.2* 4.6* 5.0*  ALBUMIN 2.2* 1.6* 1.6*   Recent Labs  Lab 06/03/2020 0333  LIPASE 28  AMYLASE 152*   No results for input(s): AMMONIA in the last 168 hours.  ABG    Component Value Date/Time   PHART 7.347 (L) 06/14/2020 0409   PCO2ART 43.9 06/09/2020 0409   PO2ART 113 (H) 05/29/2020 0409   HCO3 24.1 06/14/2020 0409   TCO2 25 06/05/2020 0409   ACIDBASEDEF 2.0 05/28/2020 0409   O2SAT 98.0 05/28/2020 0409     Coagulation Profile: No results for input(s): INR, PROTIME in the last 168 hours.  Cardiac Enzymes: No results for input(s): CKTOTAL, CKMB, CKMBINDEX, TROPONINI in the last 168 hours.  HbA1C: HB A1C (BAYER DCA - WAIVED)  Date/Time Value Ref Range Status  12/09/2019 01:10 PM 7.8 (H) <7.0 % Final    Comment:                                          Diabetic Adult            <7.0                                       Healthy Adult        4.3 - 5.7                                                           (DCCT/NGSP) American Diabetes Association's Summary of Glycemic Recommendations for Adults with Diabetes: Hemoglobin A1c <7.0%. More stringent glycemic goals (A1c <6.0%) may further reduce complications at the cost  of increased risk of hypoglycemia.   09/03/2019 12:51 PM >14.0 (H) <7.0 % Final    Comment:                                          Diabetic Adult            <7.0                                       Healthy Adult        4.3 - 5.7                                                           (  DCCT/NGSP) American Diabetes Association's Summary of Glycemic Recommendations for Adults with Diabetes: Hemoglobin A1c <7.0%. More stringent glycemic goals (A1c <6.0%) may further reduce complications at the cost of increased risk of hypoglycemia.    Hgb A1c MFr Bld  Date/Time Value Ref Range Status  06/17/2020 03:33 AM 15.4 (H) 4.8 - 5.6 % Final    Comment:    (NOTE)         Prediabetes: 5.7 - 6.4         Diabetes: >6.4         Glycemic control for adults with diabetes: <7.0   02/01/2020 06:48 PM 8.1 (H) 4.8 - 5.6 % Final    Comment:    (NOTE) Pre diabetes:          5.7%-6.4%  Diabetes:              >6.4%  Glycemic control for   <7.0% adults with diabetes     CBG: Recent Labs  Lab 06/02/20 1529 06/02/20 1916 06/02/20 2317 06/03/20 0330 06/03/20 0738  GLUCAP 200* 171* 151* 154* 204*    Review of Systems:     Past Medical History:  He,  has a past medical history of CAD (coronary artery disease), Chronic back pain, CKD (chronic kidney disease), stage IV ([place]), Diverticulosis, Essential hypertension, GERD (gastroesophageal reflux disease), Hyperlipidemia, ICD (implantable cardioverter-defibrillator) in place, Onychomycosis, Polysubstance abuse ([place]), Secondary cardiomyopathy ([place]), and Type 2 diabetes mellitus ([place]).   Surgical History:   Past Surgical History:  Procedure Laterality Date  . APPENDECTOMY    . CARDIAC CATHETERIZATION  2005   4 frech cath  . COLONOSCOPY N/A 08/01/2013   Procedure: COLONOSCOPY;  Surgeon: Rogene Houston, MD;  [facility];  Service: Endoscopy;  Laterality: N/A;  rescheduled to [place] notified pt  . CORONARY STENT INTERVENTION N/A  03/19/2018   Procedure: CORONARY STENT INTERVENTION;  Surgeon: Leonie Man, MD;  Location: [place] [facility];  Service: Cardiovascular;  Laterality: N/A;  . ESOPHAGOGASTRODUODENOSCOPY N/A 04/23/2015   Procedure: ESOPHAGOGASTRODUODENOSCOPY (EGD);  Surgeon: Rogene Houston, MD;  Location: [facility];  Service: Endoscopy;  Laterality: N/A;  1:25  . RIGHT/LEFT HEART CATH AND CORONARY ANGIOGRAPHY N/A 06/03/2016   Procedure: Right/Left Heart Cath and Coronary Angiography;  Surgeon: Belva Crome, MD;  Location: [place] [facility];  Service: Cardiovascular;  Laterality: N/A;  . RIGHT/LEFT HEART CATH AND CORONARY ANGIOGRAPHY N/A 03/19/2018   Procedure: RIGHT/LEFT HEART CATH AND CORONARY ANGIOGRAPHY;  Surgeon: Jolaine Artist, MD;  Location: [place] [facility];  Service: Cardiovascular;  Laterality: N/A;  . SUBQ ICD IMPLANT N/A 05/27/2019   Procedure: SUBQ ICD IMPLANT;  Surgeon: Evans Lance, MD;  Location: [place] [facility];  Service: Cardiovascular;  Laterality: N/A;     Social History:   reports that he has never smoked. His smokeless tobacco use includes snuff. He reports current alcohol use of about 1.0 standard drink of alcohol per week. He reports current drug use. Drug: Marijuana.   Family History:  His family history includes Alcohol abuse in his brother; Diabetes in his father; Hypertension in his father; Lung cancer in his mother. There is no history of Colon cancer.   Allergies Allergies  Allergen Reactions  . Sulfa Antibiotics Shortness Of Breath  . Penicillins Rash and Other (See Comments)    Has patient had a PCN reaction causing immediate rash, facial/tongue/throat swelling, SOB or lightheadedness with hypotension: No Has patient had a PCN reaction causing  severe rash involving mucus membranes or skin necrosis: No Has patient had a PCN reaction that required hospitalization No Has patient had a PCN reaction occurring within the last 10 years: No If all of the above answers  are "NO", then may proceed with Cephalosporin use.      Home Medications  Prior to Admission medications   Medication Sig Start Date End Date Taking? Authorizing Provider  aspirin EC 81 MG tablet Take 81 mg by mouth daily.   Yes [provider]  carvedilol (COREG) 6.25 MG tablet TAKE 1 AND 1/2 TABLET BY MOUTH TWICE DAILY. Patient taking differently: Take 9.375 mg by mouth 2 (two) times daily with a meal. 04/22/20  Yes Larey Dresser, MD  fluticasone (FLONASE) 50 MCG/ACT nasal spray INSTILL 2 SPRAYS INTO EACH NOSTRIL DAILY. Patient taking differently: Place 2 sprays into both nostrils daily as needed for allergies. 01/20/20  Yes Stacks, Cletus Gash, MD  hydrALAZINE (APRESOLINE) 100 MG tablet TAKE (1) TABLET BY MOUTH (3) TIMES DAILY. Patient taking differently: Take 100 mg by mouth 3 (three) times daily. 12/23/19  Yes Stacks, Cletus Gash, MD  insulin glargine, 2 Unit Dial, (TOUJEO MAX SOLOSTAR) 300 UNIT/ML Solostar Pen Inject 10 Units into the skin at bedtime. Patient taking differently: Inject 40-50 Units into the skin See admin instructions. Inject 50 units into the skin in the morning before breakfast and inject 40 units into the skin in the evening before supper. 02/07/20  Yes Tat, Shanon Brow, MD  isosorbide mononitrate (IMDUR) 120 MG 24 hr tablet TAKE (1) TABLET BY MOUTH ONCE DAILY. Patient taking differently: Take 120 mg by mouth daily. 12/23/19  Yes Stacks, Cletus Gash, MD  nitroGLYCERIN (NITROSTAT) 0.4 MG SL tablet DISSOLVE 1 TAB UNDER TOUNGE FOR CHEST PAIN. MAY REPEAT EVERY 5 MINUTES FOR 3 DOSES. IF NO RELIEF CALL 911 OR GO TO ER Patient taking differently: Place 0.4 mg under the tongue every 5 (five) minutes x 3 doses as needed for chest pain. 09/20/18  Yes [name], MD  pantoprazole (PROTONIX) 40 MG tablet Take 1 tablet (40 mg total) by mouth daily. 07/11/19  Yes Stacks, Cletus Gash, MD  potassium chloride SA (KLOR-CON) 20 MEQ tablet Take 20 mEq by mouth daily. 05/22/20  Yes [provider]   PROAIR HFA 108 (90 Base) MCG/ACT inhaler USE 2 PUFFS EVERY 6 HOURS AS NEEDED Patient taking differently: Inhale 2 puffs into the lungs every 6 (six) hours as needed for wheezing or shortness of breath. 11/11/19  Yes Stacks, Cletus Gash, MD  ranolazine (RANEXA) 500 MG 12 hr tablet TAKE 1 TABLET BY MOUTH TWICE DAILY. Patient taking differently: Take 500 mg by mouth 2 (two) times daily. 06/24/19  Yes Larey Dresser, MD  rosuvastatin (CRESTOR) 10 MG tablet TAKE 1 TABLET BY MOUTH ONCE A DAY. Patient taking differently: Take 10 mg by mouth daily. 12/23/19  Yes Stacks, Cletus Gash, MD  Semaglutide, 1 MG/DOSE, (OZEMPIC, 1 MG/DOSE,) 2 MG/1.5ML SOPN Inject 1 mg into the skin once a week. 12/09/19  Yes Stacks, Cletus Gash, MD  spironolactone (ALDACTONE) 25 MG tablet Take 25 mg by mouth 2 (two) times daily. 03/19/20  Yes [provider]  torsemide (DEMADEX) 20 MG tablet Take 3 tablets (60 mg total) by mouth 2 (two) times daily. Patient taking differently: Take 80 mg by mouth 2 (two) times daily. 02/07/20  Yes Tat, Shanon Brow, MD  XARELTO 2.5 MG TABS tablet TAKE (1) TABLET BY MOUTH TWICE DAILY. Patient taking differently: Take 2.5 mg by mouth 2 (two) times daily. 09/27/19  Yes Evans Lance, MD  blood glucose meter kit and supplies KIT Dispense based on patient and insurance preference. Test four times daily as directed. (FOR ICD-10 : E11.9 03/21/19   [name], MD  Blood Glucose Monitoring Suppl (ONETOUCH VERIO) w/Device KIT 1 Device by Does not apply route 2 (two) times daily. 04/01/19   Loman Brooklyn, FNP  Continuous Blood Gluc Sensor (FREESTYLE LIBRE 2 SENSOR) MISC Use to test blood sugar up to 6 times daily as directed. E11.9 09/23/19   [name], MD  glucose blood (ONETOUCH VERIO) test strip USE FOUR TIMES DAILY AS NEEDED Dx E11.9 08/30/19   [name], MD  HYDROcodone-acetaminophen (NORCO/VICODIN) 5-325 MG tablet Take 1 tablet by mouth every 6 (six) hours as needed for moderate pain. Patient not taking: No  sig reported 04/30/20   Carole Civil, MD  icosapent Ethyl (VASCEPA) 1 g capsule Take 2 capsules (2 g total) by mouth 2 (two) times daily. Patient not taking: No sig reported 02/04/19   Larey Dresser, MD  Lancets [facility] ULTRASOFT) lancets Use as instructed   DX E11.9 08/30/19   [name], MD  loratadine (CLARITIN) 10 MG tablet Take 1 tablet (10 mg total) by mouth daily. Patient not taking: No sig reported 04/12/19   Janora Norlander, DO  oxyCODONE-acetaminophen (PERCOCET/ROXICET) 5-325 MG tablet Take 1 tablet by mouth every 4 (four) hours as needed for severe pain. Patient not taking: No sig reported 04/17/20   Margarita Mail, PA-C  pregabalin (LYRICA) 200 MG capsule Take 2 capsules (400 mg total) by mouth at bedtime. Patient not taking: No sig reported 03/18/19   [name], MD  promethazine (PHENERGAN) 12.5 MG tablet Take 1 tablet (12.5 mg total) by mouth every 6 (six) hours as needed for nausea or vomiting. Patient not taking: No sig reported 05/18/20   Carole Civil, MD  terbinafine (LAMISIL) 1 % cream Apply 1 application topically 2 (two) times daily. Apply to both feet and between toes Patient not taking: No sig reported 04/10/18   [name], MD  tiZANidine (ZANAFLEX) 4 MG tablet Take 1 tablet (4 mg total) by mouth every 6 (six) hours as needed for muscle spasms. Patient not taking: No sig reported 09/03/19   [name], MD  Vitamin D, Ergocalciferol, (DRISDOL) 1.25 MG (50000 UNIT) CAPS capsule Take 1 capsule (50,000 Units total) by mouth every 7 (seven) days. Patient not taking: No sig reported 12/10/19 12/08/20  [name], MD     Domenick Bookbinder, MS4    PCCM:   65 yo M, cardiac arrest at home, baseline cardiomyopathy, ischemic heart disease. His kidney function is worsening. He was given lokelma X1 last night   I am not sure why he remains so encephalopathic.   BP 118/65 (BP Location: Right Arm)   Pulse 69   Temp 99.5 F (37.5 C) (Oral)    Resp 12   Ht 5' 9"  (1.753 m)   Wt 96.1 kg   SpO2 97%   BMI 31.29 kg/m   Gen: elderly male, critically ill HENT: ETT in place, NCAT Heart: RRR, s1 s2 Lungs: BL vented breaths  Abd: soft nt nd   Labs reviewed: worsening SCR  K 5.3  A:  Cardiac arrest  Baseline Cardiomyopathy  Decompensated Acute Systolic Heart Failure  Ischemic heart disease, LHC in 2020 Possible RML aspiration pneumonia AKI on CKD IV, worsening  Hyperkalemia  Acute metabolic encephalopathy  HHS, improved   P: Consult neurology, remains encephalopathic despite  no significant findings on MRI and EEG Kidney function remains poor Lasix 157m X1  I discussed case with Dr. LAugustin Coupefrom nephrology  We will place cvc and check coox  If coox low we will start milrinone   This patient is critically ill with multiple organ system failure; which, requires frequent high complexity decision making, assessment, support, evaluation, and titration of therapies. This was completed through the application of advanced monitoring technologies and extensive interpretation of multiple databases. During this encounter critical care time was devoted to patient care services described in this note for 45 minutes.   BGarner Nash DO  Pulmonary Critical Care 06/03/2020 10:34 AM

## 2020-06-03 NOTE — Consult Note (Addendum)
Reason for Consult: Renal failure  Referring Physician:  Dr. Valeta Harms  Chief Complaint: AMS and seizures  Assessment/Plan: 1. 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 He went through 3-4 rounds of CPR requiring Epi x3, amiodarone and Ca + intubation. UOP has steadily decreased and currently oliguric. CXR ATX. Currently getting a high dose Lasix challenge.  Patient is +1711 ml _0  and likely even more positive after multiple rounds of CPR in the ED. - Will obtain a renal ultrasound to determine size of the kidneys and any cortical thinning. Will his level of baseline CKD likely will have smaller kidneys and thinning of the cortex. I doubt he has obstructive uropathy.  - Urine microscopy did not reveal an active sediment or any casts suggestive of ATN but he may be early in his course.  - Will follow up response to the Lasix. - Will check urine lytes as well; I personally did not see any RBC's on examination of the urine sediment. - TB is normal but will check haptoglobin as well + CK. - No absolute indication for RRT at this time. - Agree with Lokelma as needed - Avoid nephrotoxic agents as you are already doing and dose for GFR <20 - Hopefully his renal parameters will start to stabilize over the next few days.  2. HTN - controlled 3. DM 4. S/p cardiac arrest - not clear the etiology but he has a h/o HFrEF and CASHD    HPI: Brad Mendoza is an 65 y.o. 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 Patient also with a  recent RLE fracture and wheelchair bound presenting to the ED 4/9 with decreased AMS and ? Seizure like activity with slurred speech in the field as well. Patient with agonal respirations and TC seizure in the ambulance treated with Versed. In the ED he went pulseless requiring CPR with ROSC after 3-4 rounds requiring  Epi x3 Amiodarone and calcium; patient with a non shockable rhythm but did require intubation for airway protection. CT of the head was negative. Patient also treated with Keppra for the seizures and transferred to [facility] for a MRI. UDS positive for THC. Patient UOP has been dropping over the past few days to the oliguric status for the past 2 days prior to consultation.   ROS Pertinent items are noted in HPI.  Chemistry and CBC: Creat  Date/Time Value Ref Range Status  07/11/2012 01:08 PM 1.04 0.50 - 1.35 mg/dL Final   Creatinine, Ser  Date/Time Value Ref Range Status  06/03/2020 09:57 AM 3.70 (H) 0.61 - 1.24 mg/dL Final  06/03/2020 12:54 AM 3.84 (H) 0.61 - 1.24 mg/dL Final  06/02/2020 01:18 AM 3.19 (H) 0.61 - 1.24 mg/dL Final  06/01/2020 10:24 AM 3.05 (H) 0.61 - 1.24 mg/dL Final  05/28/2020 07:58 AM 2.96 (H) 0.61 - 1.24 mg/dL Final  05/23/2020 03:33 AM 2.81 (H) 0.61 - 1.24 mg/dL Final  02/07/2020 05:39 AM 3.22 (H) 0.61 - 1.24 mg/dL Final  02/06/2020 06:31 AM 3.35 (H) 0.61 - 1.24 mg/dL Final  02/05/2020 05:50 AM 2.76 (H) 0.61 - 1.24 mg/dL Final  02/04/2020 05:47 AM 2.85 (H) 0.61 - 1.24 mg/dL Final  02/03/2020 06:10 AM 2.81 (H) 0.61 - 1.24 mg/dL Final  02/02/2020 07:14 AM 2.50 (H) 0.61 - 1.24 mg/dL Final  02/01/2020 06:48 PM 2.61 (H) 0.61 -  1.24 mg/dL Final  12/09/2019 01:48 PM 2.54 (H) 0.76 - 1.27 mg/dL Final  09/03/2019 12:52 PM 2.49 (H) 0.76 - 1.27 mg/dL Final  07/09/2019 11:01 AM 2.55 (H) 0.76 - 1.27 mg/dL Final  05/24/2019 09:33 AM 2.01 (H) 0.76 - 1.27 mg/dL Final  04/25/2019 03:03 PM 2.14 (H) 0.76 - 1.27 mg/dL Final  04/02/2019 03:37 PM 2.11 (H) 0.61 - 1.24 mg/dL Final  03/28/2019 07:15 PM 1.82 (H) 0.61 - 1.24 mg/dL Final  02/16/2019 12:23 PM 2.90 (H) 0.61 - 1.24 mg/dL Final  02/05/2019 01:48 PM 3.68 (HH) 0.76 - 1.27 mg/dL Final    Comment:                      Client Requested Flag  01/22/2019 04:00 PM 3.11 (H) 0.61 - 1.24 mg/dL Final  11/22/2018 02:58 PM 3.36 (HH) 0.76 -  1.27 mg/dL Final    Comment:                      Client Requested Flag  08/26/2018 01:10 AM 1.91 (H) 0.61 - 1.24 mg/dL Final  07/27/2018 02:43 PM 3.00 (H) 0.76 - 1.27 mg/dL Final  04/19/2018 08:26 AM 2.79 (H) 0.61 - 1.24 mg/dL Final  04/18/2018 03:44 PM 2.80 (H) 0.61 - 1.24 mg/dL Final  04/10/2018 02:42 PM 2.21 (H) 0.76 - 1.27 mg/dL Final  03/27/2018 03:53 AM 2.50 (H) 0.61 - 1.24 mg/dL Final  03/26/2018 05:41 AM 2.62 (H) 0.61 - 1.24 mg/dL Final  03/25/2018 04:51 AM 2.36 (H) 0.61 - 1.24 mg/dL Final  03/24/2018 05:15 AM 2.28 (H) 0.61 - 1.24 mg/dL Final  03/23/2018 05:08 AM 2.27 (H) 0.61 - 1.24 mg/dL Final  03/22/2018 04:12 AM 2.36 (H) 0.61 - 1.24 mg/dL Final  03/21/2018 07:03 AM 2.22 (H) 0.61 - 1.24 mg/dL Final  03/20/2018 04:35 AM 2.02 (H) 0.61 - 1.24 mg/dL Final  03/19/2018 02:43 PM 1.83 (H) 0.61 - 1.24 mg/dL Final  03/12/2018 11:59 AM 1.76 (H) 0.61 - 1.24 mg/dL Final  02/06/2018 02:17 PM 1.78 (H) 0.76 - 1.27 mg/dL Final  12/14/2017 10:33 AM 2.57 (H) 0.76 - 1.27 mg/dL Final  12/08/2017 11:10 AM 2.80 (H) 0.61 - 1.24 mg/dL Final  03/30/2017 03:47 PM 1.73 (H) 0.61 - 1.24 mg/dL Final  03/16/2017 04:05 PM 2.18 (H) 0.61 - 1.24 mg/dL Final  03/08/2017 10:10 PM 1.76 (H) 0.61 - 1.24 mg/dL Final  12/26/2016 04:01 PM 1.69 (H) 0.76 - 1.27 mg/dL Final  12/20/2016 01:30 PM 1.88 (H) 0.76 - 1.27 mg/dL Final  12/07/2016 03:43 PM 1.93 (H) 0.61 - 1.24 mg/dL Final  12/02/2016 09:24 AM 1.97 (H) 0.76 - 1.27 mg/dL Final  10/04/2016 01:04 PM 1.31 (H) 0.76 - 1.27 mg/dL Final  09/21/2016 12:00 AM 1.70 (H) 0.76 - 1.27 mg/dL Final  09/01/2016 09:28 AM 1.47 (H) 0.76 - 1.27 mg/dL Final   Recent Labs  Lab 06/17/2020 0333 06/19/2020 0409 05/30/2020 0758 06/01/20 1024 06/02/20 0118 06/03/20 0054 06/03/20 0957  NA 138 138 137 137 137 139 140  K 4.2 4.3 4.5 4.3 4.4 5.3* 5.1  CL 106  --  106 106 106 106 107  CO2 22  --  _0 GLUCOSE 202*  --  194* 230* 220* 163* 255*  BUN 45*  --  46* 57* 72* 95*  100*  CREATININE 2.81*  --  2.96* 3.05* 3.19* 3.84* 3.70*  CALCIUM 9.2  --  8.9 8.5* 8.0* 8.1* 8.1*  PHOS 4.8*  --   --   --   --   --   --  Recent Labs  Lab 06/20/2020 0333 06/02/2020 0409 05/30/2020 0758 06/01/20 1024 06/02/20 0118 06/02/20 1702 06/03/20 0054  WBC 15.1*  --  14.7* 10.0 9.9  --  11.5*  NEUTROABS 13.3*  --   --   --   --   --   --   HGB 10.4*   < > 9.5* 9.5* 8.0* 7.9* 7.8*  HCT 32.2*   < > 29.9* 29.8* 25.0* 25.1* 24.7*  MCV 82.1  --  81.5 81.6 80.6  --  82.6  PLT 195  --  173 154 136*  --  117*   < > = values in this interval not displayed.   Liver Function Tests: Recent Labs  Lab 06/16/2020 0333 06/02/20 1702 06/03/20 0054  AST 122* 20 22  ALT 107* 39 37  ALKPHOS 118 118 118  BILITOT 0.7 0.8 0.7  PROT 5.2* 4.6* 5.0*  ALBUMIN 2.2* 1.6* 1.6*   Recent Labs  Lab 06/16/2020 0333  LIPASE 28  AMYLASE 152*   No results for input(s): AMMONIA in the last 168 hours. Cardiac Enzymes: No results for input(s): CKTOTAL, CKMB, CKMBINDEX, TROPONINI in the last 168 hours. Iron Studies: No results for input(s): IRON, TIBC, TRANSFERRIN, FERRITIN in the last 72 hours. PT/INR: _0 (inr:5)  Xrays/Other Studies: ) Results for orders placed or performed during the hospital encounter of 06/01/2020 (from the past 48 hour(s))  Glucose, capillary     Status: Abnormal   Collection Time: 06/01/20 12:34 PM  Result Value Ref Range   Glucose-Capillary 195 (H) 70 - 99 mg/dL    Comment: Glucose reference range applies only to samples taken after fasting for at least 8 hours.  Glucose, capillary     Status: Abnormal   Collection Time: 06/01/20  3:15 PM  Result Value Ref Range   Glucose-Capillary 181 (H) 70 - 99 mg/dL    Comment: Glucose reference range applies only to samples taken after fasting for at least 8 hours.  Glucose, capillary     Status: Abnormal   Collection Time: 06/01/20  7:29 PM  Result Value Ref Range   Glucose-Capillary 198 (H) 70 - 99 mg/dL    Comment:  Glucose reference range applies only to samples taken after fasting for at least 8 hours.  Glucose, capillary     Status: Abnormal   Collection Time: 06/01/20 11:25 PM  Result Value Ref Range   Glucose-Capillary 204 (H) 70 - 99 mg/dL    Comment: Glucose reference range applies only to samples taken after fasting for at least 8 hours.  Basic metabolic panel     Status: Abnormal   Collection Time: 06/02/20  1:18 AM  Result Value Ref Range   Sodium 137 135 - 145 mmol/L   Potassium 4.4 3.5 - 5.1 mmol/L   Chloride 106 98 - 111 mmol/L   CO2 23 22 - 32 mmol/L   Glucose, Bld 220 (H) 70 - 99 mg/dL    Comment: Glucose reference range applies only to samples taken after fasting for at least 8 hours.   BUN 72 (H) 8 - 23 mg/dL   Creatinine, Ser 3.19 (H) 0.61 - 1.24 mg/dL   Calcium 8.0 (L) 8.9 - 10.3 mg/dL   GFR, Estimated 21 (L) >60 mL/min    Comment: (NOTE) Calculated using the CKD-EPI Creatinine Equation (2021)    Anion gap 8 5 - 15    Comment: Performed at [facility] [address] [place], [place] [address]  CBC  Status: Abnormal   Collection Time: 06/02/20  1:18 AM  Result Value Ref Range   WBC 9.9 4.0 - 10.5 K/uL   RBC 3.10 (L) 4.22 - 5.81 MIL/uL   Hemoglobin 8.0 (L) 13.0 - 17.0 g/dL   HCT 25.0 (L) 39.0 - 52.0 %   MCV 80.6 80.0 - 100.0 fL   MCH 25.8 (L) 26.0 - 34.0 pg   MCHC 32.0 30.0 - 36.0 g/dL   RDW 15.7 (H) 11.5 - 15.5 %   Platelets 136 (L) 150 - 400 K/uL   nRBC 0.0 0.0 - 0.2 %    Comment: Performed at [facility] [address] [place], [place] [address]  Glucose, capillary     Status: Abnormal   Collection Time: 06/02/20  3:23 AM  Result Value Ref Range   Glucose-Capillary 199 (H) 70 - 99 mg/dL    Comment: Glucose reference range applies only to samples taken after fasting for at least 8 hours.  Glucose, capillary     Status: Abnormal   Collection Time: 06/02/20  7:38 AM  Result Value Ref Range   Glucose-Capillary 211 (H) 70 - 99 mg/dL    Comment:  Glucose reference range applies only to samples taken after fasting for at least 8 hours.  Glucose, capillary     Status: Abnormal   Collection Time: 06/02/20 11:47 AM  Result Value Ref Range   Glucose-Capillary 197 (H) 70 - 99 mg/dL    Comment: Glucose reference range applies only to samples taken after fasting for at least 8 hours.  Glucose, capillary     Status: Abnormal   Collection Time: 06/02/20  3:29 PM  Result Value Ref Range   Glucose-Capillary 200 (H) 70 - 99 mg/dL    Comment: Glucose reference range applies only to samples taken after fasting for at least 8 hours.  Hemoglobin and hematocrit, blood     Status: Abnormal   Collection Time: 06/02/20  5:02 PM  Result Value Ref Range   Hemoglobin 7.9 (L) 13.0 - 17.0 g/dL   HCT 25.1 (L) 39.0 - 52.0 %    Comment: Performed at [facility] [address], [place], [place] [address]  Hepatic function panel Once     Status: Abnormal   Collection Time: 06/02/20  5:02 PM  Result Value Ref Range   Total Protein 4.6 (L) 6.5 - 8.1 g/dL   Albumin 1.6 (L) 3.5 - 5.0 g/dL   AST 20 15 - 41 U/L   ALT 39 0 - 44 U/L   Alkaline Phosphatase 118 38 - 126 U/L   Total Bilirubin 0.8 0.3 - 1.2 mg/dL   Bilirubin, Direct 0.1 0.0 - 0.2 mg/dL   Indirect Bilirubin 0.7 0.3 - 0.9 mg/dL    Comment: Performed at [facility] [address], [place], [place] [address]  Glucose, capillary     Status: Abnormal   Collection Time: 06/02/20  7:16 PM  Result Value Ref Range   Glucose-Capillary 171 (H) 70 - 99 mg/dL    Comment: Glucose reference range applies only to samples taken after fasting for at least 8 hours.  Glucose, capillary     Status: Abnormal   Collection Time: 06/02/20 11:17 PM  Result Value Ref Range   Glucose-Capillary 151 (H) 70 - 99 mg/dL    Comment: Glucose reference range applies only to samples taken after fasting for at least 8 hours.  CBC     Status: Abnormal   Collection Time:  06/03/20 12:54 AM  Result Value Ref Range    WBC 11.5 (H) 4.0 - 10.5 K/uL   RBC 2.99 (L) 4.22 - 5.81 MIL/uL   Hemoglobin 7.8 (L) 13.0 - 17.0 g/dL   HCT 24.7 (L) 39.0 - 52.0 %   MCV 82.6 80.0 - 100.0 fL   MCH 26.1 26.0 - 34.0 pg   MCHC 31.6 30.0 - 36.0 g/dL   RDW 15.9 (H) 11.5 - 15.5 %   Platelets 117 (L) 150 - 400 K/uL    Comment: SPECIMEN CHECKED FOR CLOTS Immature Platelet Fraction may be clinically indicated, consider ordering this additional test [id number] REPEATED TO VERIFY PLATELET COUNT CONFIRMED BY SMEAR    nRBC 0.2 0.0 - 0.2 %    Comment: Performed at [facility], [place] [address], [place], [place] [address]  Comprehensive metabolic panel Once     Status: Abnormal   Collection Time: 06/03/20 12:54 AM  Result Value Ref Range   Sodium 139 135 - 145 mmol/L   Potassium 5.3 (H) 3.5 - 5.1 mmol/L   Chloride 106 98 - 111 mmol/L   CO2 23 22 - 32 mmol/L   Glucose, Bld 163 (H) 70 - 99 mg/dL    Comment: Glucose reference range applies only to samples taken after fasting for at least 8 hours.   BUN 95 (H) 8 - 23 mg/dL   Creatinine, Ser 3.84 (H) 0.61 - 1.24 mg/dL   Calcium 8.1 (L) 8.9 - 10.3 mg/dL   Total Protein 5.0 (L) 6.5 - 8.1 g/dL   Albumin 1.6 (L) 3.5 - 5.0 g/dL   AST 22 15 - 41 U/L   ALT 37 0 - 44 U/L   Alkaline Phosphatase 118 38 - 126 U/L   Total Bilirubin 0.7 0.3 - 1.2 mg/dL   GFR, Estimated 17 (L) >60 mL/min    Comment: (NOTE) Calculated using the CKD-EPI Creatinine Equation (2021)    Anion gap 10 5 - 15    Comment: Performed at [facility], [place] [address], [place], [place] [address]  Glucose, capillary     Status: Abnormal   Collection Time: 06/03/20  3:30 AM  Result Value Ref Range   Glucose-Capillary 154 (H) 70 - 99 mg/dL    Comment: Glucose reference range applies only to samples taken after fasting for at least 8 hours.  Glucose, capillary     Status: Abnormal   Collection Time: 06/03/20  7:38 AM  Result Value Ref Range   Glucose-Capillary 204 (H) 70 - 99 mg/dL    Comment: Glucose  reference range applies only to samples taken after fasting for at least 8 hours.  Basic metabolic panel     Status: Abnormal   Collection Time: 06/03/20  9:57 AM  Result Value Ref Range   Sodium 140 135 - 145 mmol/L   Potassium 5.1 3.5 - 5.1 mmol/L   Chloride 107 98 - 111 mmol/L   CO2 25 22 - 32 mmol/L   Glucose, Bld 255 (H) 70 - 99 mg/dL    Comment: Glucose reference range applies only to samples taken after fasting for at least 8 hours.   BUN 100 (H) 8 - 23 mg/dL   Creatinine, Ser 3.70 (H) 0.61 - 1.24 mg/dL   Calcium 8.1 (L) 8.9 - 10.3 mg/dL   GFR, Estimated 17 (L) >60 mL/min    Comment: (NOTE) Calculated using the CKD-EPI Creatinine Equation (2021)    Anion gap 8 5 - 15    Comment:  Performed at [facility], [place] [address] [place],  [address]   MR ANGIO HEAD WO CONTRAST  Result Date: 06/01/2020 CLINICAL DATA:  Acute neuro deficit EXAM: MRI HEAD WITHOUT CONTRAST MRA HEAD WITHOUT CONTRAST TECHNIQUE: Multiplanar, multiecho pulse sequences of the brain and surrounding structures were obtained without intravenous contrast. Angiographic images of the head were obtained using MRA technique without contrast. COMPARISON:  CT head 03/28/2019 FINDINGS: MRI HEAD FINDINGS Brain: Negative for acute infarct.  Negative for hemorrhage or mass Mild atrophy.  Mild white matter changes bilaterally. Vascular: Normal arterial flow voids. Skull and upper cervical spine: No focal skeletal abnormality. Sinuses/Orbits: Extensive mucosal edema in the maxillary and ethmoid sinuses bilaterally. Air-fluid levels in the sphenoid sinus. Negative orbit Other: None MRA HEAD FINDINGS Both vertebral arteries widely patent to the basilar. Basilar widely patent. PICA not visualized. AICA patent bilaterally. Basilar widely patent. Superior cerebellar and posterior cerebral arteries patent. Mild to moderate stenosis right P3 segment. Internal carotid artery widely patent bilaterally. Anterior and middle cerebral  arteries widely patent bilaterally. Negative for cerebral aneurysm. IMPRESSION: Negative for acute infarct. Mild atrophy and mild white matter changes Negative for intracranial large vessel occlusion. Stenosis distal right PCA. Electronically Signed   By: Franchot Gallo M.D.   On: 06/01/2020 12:39   MR BRAIN WO CONTRAST  Result Date: 06/01/2020 CLINICAL DATA:  Acute neuro deficit EXAM: MRI HEAD WITHOUT CONTRAST MRA HEAD WITHOUT CONTRAST TECHNIQUE: Multiplanar, multiecho pulse sequences of the brain and surrounding structures were obtained without intravenous contrast. Angiographic images of the head were obtained using MRA technique without contrast. COMPARISON:  CT head 03/28/2019 FINDINGS: MRI HEAD FINDINGS Brain: Negative for acute infarct.  Negative for hemorrhage or mass Mild atrophy.  Mild white matter changes bilaterally. Vascular: Normal arterial flow voids. Skull and upper cervical spine: No focal skeletal abnormality. Sinuses/Orbits: Extensive mucosal edema in the maxillary and ethmoid sinuses bilaterally. Air-fluid levels in the sphenoid sinus. Negative orbit Other: None MRA HEAD FINDINGS Both vertebral arteries widely patent to the basilar. Basilar widely patent. PICA not visualized. AICA patent bilaterally. Basilar widely patent. Superior cerebellar and posterior cerebral arteries patent. Mild to moderate stenosis right P3 segment. Internal carotid artery widely patent bilaterally. Anterior and middle cerebral arteries widely patent bilaterally. Negative for cerebral aneurysm. IMPRESSION: Negative for acute infarct. Mild atrophy and mild white matter changes Negative for intracranial large vessel occlusion. Stenosis distal right PCA. Electronically Signed   By: Franchot Gallo M.D.   On: 06/01/2020 12:39   DG CHEST PORT 1 VIEW  Result Date: 06/02/2020 CLINICAL DATA:  Pneumonia. EXAM: PORTABLE CHEST 1 VIEW COMPARISON:  June 01, 2020. FINDINGS: Stable cardiomediastinal silhouette. Endotracheal and  nasogastric tubes are unchanged in position. No pneumothorax is noted. Mild bibasilar subsegmental atelectasis is noted. Bony thorax is unremarkable. IMPRESSION: Stable support apparatus.  Mild bibasilar subsegmental atelectasis. Electronically Signed   By: Marijo Conception M.D.   On: 06/02/2020 17:33    PMH:   Past Medical History:  Diagnosis Date  . CAD (coronary artery disease)    DES to RCA January 2020  . Chronic back pain   . CKD (chronic kidney disease), stage IV ([facility])   . Diverticulosis   . Essential hypertension   . GERD (gastroesophageal reflux disease)   . Hyperlipidemia   . ICD (implantable cardioverter-defibrillator) in place    [facility] - Dr. Lovena Le, implanted April 2021  . Onychomycosis   . Polysubstance abuse ([facility] [place])   . 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 Type 2 diabetes mellitus ([facility])     PSH:   Past Surgical History:  Procedure Laterality Date  . APPENDECTOMY    . CARDIAC CATHETERIZATION  2005   4 frech cath  . COLONOSCOPY N/A 08/01/2013   Procedure: COLONOSCOPY;  Surgeon: Rogene Houston, MD;  [facility];  Service: Endoscopy;  Laterality: N/A;  rescheduled to [facility] notified pt  . CORONARY STENT INTERVENTION N/A 03/19/2018   Procedure: CORONARY STENT INTERVENTION;  Surgeon: Leonie Man, MD;  Location: [facility] CV LAB;  Service: Cardiovascular;  Laterality: N/A;  . ESOPHAGOGASTRODUODENOSCOPY N/A 04/23/2015   Procedure: ESOPHAGOGASTRODUODENOSCOPY (EGD);  Surgeon: Rogene Houston, MD;  [facility];  Service: Endoscopy;  Laterality: N/A;  1:25  . RIGHT/LEFT HEART CATH AND CORONARY ANGIOGRAPHY N/A 06/03/2016   Procedure: Right/Left Heart Cath and Coronary Angiography;  Surgeon: Belva Crome, MD;  Location: [facility] LAB;  Service: Cardiovascular;  Laterality: N/A;  . RIGHT/LEFT HEART CATH AND CORONARY ANGIOGRAPHY N/A 03/19/2018   Procedure: RIGHT/LEFT HEART CATH AND CORONARY ANGIOGRAPHY;   Surgeon: Jolaine Artist, MD;  Location: [facility] LAB;  Service: Cardiovascular;  Laterality: N/A;  . SUBQ ICD IMPLANT N/A 05/27/2019   Procedure: SUBQ ICD IMPLANT;  Surgeon: Evans Lance, MD;  Location: [facility];  Service: Cardiovascular;  Laterality: N/A;    Allergies:  Allergies  Allergen Reactions  . Sulfa Antibiotics Shortness Of Breath  . Penicillins Rash and Other (See Comments)    Has patient had a PCN reaction causing immediate rash, facial/tongue/throat swelling, SOB or lightheadedness with hypotension: No Has patient had a PCN reaction causing severe rash involving mucus membranes or skin necrosis: No Has patient had a PCN reaction that required hospitalization No Has patient had a PCN reaction occurring within the last 10 years: No If all of the above answers are "NO", then may proceed with Cephalosporin use.     Medications:   Prior to Admission medications   Medication Sig Start Date End Date Taking? Authorizing Provider  aspirin EC 81 MG tablet Take 81 mg by mouth daily.   Yes [provider]  carvedilol (COREG) 6.25 MG tablet TAKE 1 AND 1/2 TABLET BY MOUTH TWICE DAILY. Patient taking differently: Take 9.375 mg by mouth 2 (two) times daily with a meal. 04/22/20  Yes Larey Dresser, MD  fluticasone (FLONASE) 50 MCG/ACT nasal spray INSTILL 2 SPRAYS INTO EACH NOSTRIL DAILY. Patient taking differently: Place 2 sprays into both nostrils daily as needed for allergies. 01/20/20  Yes Stacks, Cletus Gash, MD  hydrALAZINE (APRESOLINE) 100 MG tablet TAKE (1) TABLET BY MOUTH (3) TIMES DAILY. Patient taking differently: Take 100 mg by mouth 3 (three) times daily. 12/23/19  Yes Stacks, Cletus Gash, MD  insulin glargine, 2 Unit Dial, (TOUJEO MAX SOLOSTAR) 300 UNIT/ML Solostar Pen Inject 10 Units into the skin at bedtime. Patient taking differently: Inject 40-50 Units into the skin See admin instructions. Inject 50 units into the skin in the morning before breakfast and  inject 40 units into the skin in the evening before supper. 02/07/20  Yes Tat, Shanon Brow, MD  isosorbide mononitrate (IMDUR) 120 MG 24 hr tablet TAKE (1) TABLET BY MOUTH ONCE DAILY. Patient taking differently: Take 120 mg by mouth daily. 12/23/19  Yes Stacks, Cletus Gash, MD  nitroGLYCERIN (NITROSTAT) 0.4 MG SL tablet DISSOLVE 1 TAB UNDER TOUNGE FOR CHEST PAIN. MAY REPEAT EVERY 5 MINUTES FOR 3 DOSES. IF NO RELIEF  CALL 911 OR GO TO ER Patient taking differently: Place 0.4 mg under the tongue every 5 (five) minutes x 3 doses as needed for chest pain. 09/20/18  Yes [name], MD  pantoprazole (PROTONIX) 40 MG tablet Take 1 tablet (40 mg total) by mouth daily. 07/11/19  Yes Stacks, Cletus Gash, MD  potassium chloride SA (KLOR-CON) 20 MEQ tablet Take 20 mEq by mouth daily. 05/22/20  Yes [provider]  PROAIR HFA 108 (90 Base) MCG/ACT inhaler USE 2 PUFFS EVERY 6 HOURS AS NEEDED Patient taking differently: Inhale 2 puffs into the lungs every 6 (six) hours as needed for wheezing or shortness of breath. 11/11/19  Yes Stacks, Cletus Gash, MD  ranolazine (RANEXA) 500 MG 12 hr tablet TAKE 1 TABLET BY MOUTH TWICE DAILY. Patient taking differently: Take 500 mg by mouth 2 (two) times daily. 06/24/19  Yes Larey Dresser, MD  rosuvastatin (CRESTOR) 10 MG tablet TAKE 1 TABLET BY MOUTH ONCE A DAY. Patient taking differently: Take 10 mg by mouth daily. 12/23/19  Yes Stacks, Cletus Gash, MD  Semaglutide, 1 MG/DOSE, (OZEMPIC, 1 MG/DOSE,) 2 MG/1.5ML SOPN Inject 1 mg into the skin once a week. 12/09/19  Yes Stacks, Cletus Gash, MD  spironolactone (ALDACTONE) 25 MG tablet Take 25 mg by mouth 2 (two) times daily. 03/19/20  Yes [provider]  torsemide (DEMADEX) 20 MG tablet Take 3 tablets (60 mg total) by mouth 2 (two) times daily. Patient taking differently: Take 80 mg by mouth 2 (two) times daily. 02/07/20  Yes Tat, Shanon Brow, MD  XARELTO 2.5 MG TABS tablet TAKE (1) TABLET BY MOUTH TWICE DAILY. Patient taking differently: Take 2.5 mg  by mouth 2 (two) times daily. 09/27/19  Yes Evans Lance, MD  blood glucose meter kit and supplies KIT Dispense based on patient and insurance preference. Test four times daily as directed. (FOR ICD-10 : E11.9 03/21/19   [name], MD  Blood Glucose Monitoring Suppl (ONETOUCH VERIO) w/Device KIT 1 Device by Does not apply route 2 (two) times daily. 04/01/19   Loman Brooklyn, FNP  Continuous Blood Gluc Sensor (FREESTYLE LIBRE 2 SENSOR) MISC Use to test blood sugar up to 6 times daily as directed. E11.9 09/23/19   [name], MD  glucose blood (ONETOUCH VERIO) test strip USE FOUR TIMES DAILY AS NEEDED Dx E11.9 08/30/19   [name], MD  HYDROcodone-acetaminophen (NORCO/VICODIN) 5-325 MG tablet Take 1 tablet by mouth every 6 (six) hours as needed for moderate pain. Patient not taking: No sig reported 04/30/20   Carole Civil, MD  icosapent Ethyl (VASCEPA) 1 g capsule Take 2 capsules (2 g total) by mouth 2 (two) times daily. Patient not taking: No sig reported 02/04/19   Larey Dresser, MD  Lancets [facility] ULTRASOFT) lancets Use as instructed   DX E11.9 08/30/19   [name], MD  loratadine (CLARITIN) 10 MG tablet Take 1 tablet (10 mg total) by mouth daily. Patient not taking: No sig reported 04/12/19   Janora Norlander, DO  oxyCODONE-acetaminophen (PERCOCET/ROXICET) 5-325 MG tablet Take 1 tablet by mouth every 4 (four) hours as needed for severe pain. Patient not taking: No sig reported 04/17/20   Margarita Mail, PA-C  pregabalin (LYRICA) 200 MG capsule Take 2 capsules (400 mg total) by mouth at bedtime. Patient not taking: No sig reported 03/18/19   [name], MD  promethazine (PHENERGAN) 12.5 MG tablet Take 1 tablet (12.5 mg total) by mouth every 6 (six) hours as needed for nausea or vomiting. Patient not  taking: No sig reported 05/18/20   Carole Civil, MD  terbinafine (LAMISIL) 1 % cream Apply 1 application topically 2 (two) times daily. Apply to both feet and  between toes Patient not taking: No sig reported 04/10/18   [name], MD  tiZANidine (ZANAFLEX) 4 MG tablet Take 1 tablet (4 mg total) by mouth every 6 (six) hours as needed for muscle spasms. Patient not taking: No sig reported 09/03/19   [name], MD  Vitamin D, Ergocalciferol, (DRISDOL) 1.25 MG (50000 UNIT) CAPS capsule Take 1 capsule (50,000 Units total) by mouth every 7 (seven) days. Patient not taking: No sig reported 12/10/19 12/08/20  [name], MD    Discontinued Meds:   Medications Discontinued During This Encounter  Medication Reason  . albuterol (PROVENTIL) (2.5 MG/3ML) 0.083% nebulizer solution 2.5 mg   . heparin injection 5,000 Units   . rivaroxaban (XARELTO) tablet 2.5 mg   . Omega-3 Fatty Acids (FISH OIL) 1000 MG CPDR Patient Preference  . Capsaicin (ICY HOT ARTHRITIS THERAPY EX) Patient Preference  . Cholecalciferol (VITAMIN D3) 25 MCG (1000 UT) CAPS Patient Preference  . Multiple Vitamins-Minerals (CENTRUM SILVER ADULT 50+ PO) Patient Preference  . midazolam (VERSED) injection 2 mg   . midazolam (VERSED) injection 2 mg   . midazolam (VERSED) injection 2 mg   . rivaroxaban (XARELTO) tablet 2.5 mg   . apixaban (ELIQUIS) tablet 2.5 mg   . levETIRAcetam (KEPPRA) IVPB 1000 mg/100 mL premix   . carvedilol (COREG) tablet 3.125 mg   . insulin glargine (LANTUS) injection 10 Units   . feeding supplement (VITAL HIGH PROTEIN) liquid 1,000 mL   . feeding supplement (PROSource TF) liquid 45 mL   . Ampicillin-Sulbactam (UNASYN) 3 g in sodium chloride 0.9 % 100 mL IVPB   . midazolam (VERSED) injection 2 mg   . midazolam (VERSED) 50 mg/50 mL (1 mg/mL) premix infusion   . furosemide (LASIX) injection 20 mg   . midazolam (VERSED) bolus via infusion 0-5 mg     Social History:  reports that he has never smoked. His smokeless tobacco use includes snuff. He reports current alcohol use of about 1.0 standard drink of alcohol per week. He reports current drug use. Drug:  Marijuana.  Family History:   Family History  Problem Relation Age of Onset  . Hypertension Father   . Diabetes Father   . Lung cancer Mother   . Alcohol abuse Brother   . Colon cancer Neg Hx     Blood pressure 118/65, pulse 69, temperature 99.5 F (37.5 C), temperature source Oral, resp. rate 12, height _0  (1.753 m), weight 96.1 kg, SpO2 97 %. General: NAD, intubated HEENT:  ETT, conj pallor Lungs: CTA B/L Cardiovascular: RRR,  no JVD Abdomen: soft, non-distended decr BS Extremities: no edema noted GU: foley in place        Dwana Melena, MD 06/03/2020, 11:19 AM

## 2020-06-03 NOTE — Consult Note (Signed)
Neurology Consultation  Reason for Consult: Persistent encephalopathy Referring Physician: Dr. Valeta Harms  CC: Persistent encephalopathy  History is obtained from: Chart review   HPI: Brad Mendoza is a 65 y.o. male with a medical history significant for coronary artery disease s/p drug-eluding stent to right coronary artery in January 2020, chronic kidney disease stage IV, diverticulosis, essential hypertension, gastroesophageal reflux disease, hyperlipidemia, polysubstance abuse, cardiomyopathy, heart failure with reduced ejection fraction (20-30%) with implantable cardioverter-defibrillator, and type 2 diabetes mellitus with poor glycemic control who presented to [facility] on 4/9 with concerns for seizure-like activity and a depressed level of consciousness. Patient initially found by family with slurred speech and on EMS arrival, patient had a generalized tonic-clonic seizure requiring Versed and then experienced a cardiac arrest requiring CPR. He was transferred to [facility] 4/10 for MRI imaging and further evaluation. CT and MRI imaging were without acute abnormalities and did not support any evidence of anoxic brain injury and an EEG was negative for seizures or epileptiform activity. He was found to have a blood glucose in the 800s and a hemoglobin A1c of 15.4% and seizure activity was felt to be provoked secondary to hyperglycemic hyperosmolar syndrome. He remains intubated and sedated in the ICU with concerns for ongoing encephalopathy despite management of metabolic derangements. Neurology was consulted for further evaluation of persistent encephalopathy.   ROS: Unable to obtain due to altered mental status.   Past Medical History:  Diagnosis Date  . CAD (coronary artery disease)    DES to RCA January 2020  . Chronic back pain   . CKD (chronic kidney disease), stage IV ([place])   . Diverticulosis   . Essential hypertension   . GERD (gastroesophageal reflux disease)   . Hyperlipidemia    . ICD (implantable cardioverter-defibrillator) in place    [facility] - Dr. Lovena Le, implanted April 2021  . Onychomycosis   . Polysubstance abuse ([place])   . 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 Type 2 diabetes mellitus ([place])    Past Surgical History:  Procedure Laterality Date  . APPENDECTOMY    . CARDIAC CATHETERIZATION  2005   4 frech cath  . COLONOSCOPY N/A 08/01/2013   Procedure: COLONOSCOPY;  Surgeon: Rogene Houston, MD;  [facility];  Service: Endoscopy;  Laterality: N/A;  rescheduled to [facility] notified pt  . CORONARY STENT INTERVENTION N/A 03/19/2018   Procedure: CORONARY STENT INTERVENTION;  Surgeon: Leonie Man, MD;  Location: [place] CV LAB;  Service: Cardiovascular;  Laterality: N/A;  . ESOPHAGOGASTRODUODENOSCOPY N/A 04/23/2015   Procedure: ESOPHAGOGASTRODUODENOSCOPY (EGD);  Surgeon: Rogene Houston, MD;  [facility];  Service: Endoscopy;  Laterality: N/A;  1:25  . RIGHT/LEFT HEART CATH AND CORONARY ANGIOGRAPHY N/A 06/03/2016   Procedure: Right/Left Heart Cath and Coronary Angiography;  Surgeon: Belva Crome, MD;  Location: [place] CV LAB;  Service: Cardiovascular;  Laterality: N/A;  . RIGHT/LEFT HEART CATH AND CORONARY ANGIOGRAPHY N/A 03/19/2018   Procedure: RIGHT/LEFT HEART CATH AND CORONARY ANGIOGRAPHY;  Surgeon: Jolaine Artist, MD;  Location: [place] CV LAB;  Service: Cardiovascular;  Laterality: N/A;  . SUBQ ICD IMPLANT N/A 05/27/2019   Procedure: SUBQ ICD IMPLANT;  Surgeon: Evans Lance, MD;  Location: [place] CV LAB;  Service: Cardiovascular;  Laterality: N/A;   Family History  Problem Relation Age of Onset  . Hypertension Father   . Diabetes Father   . Lung cancer Mother   .  Alcohol abuse Brother   . Colon cancer Neg Hx    Social History:   reports that he has never smoked. His smokeless tobacco use includes snuff. He reports current alcohol use of about 1.0 standard drink of  alcohol per week. He reports current drug use. Drug: Marijuana.  Medications  Current Facility-Administered Medications:  .  0.9 %  sodium chloride infusion (Manually program via Guardrails IV Fluids), , Intravenous, Once, Icard, Bradley L, DO .  acetaminophen (TYLENOL) tablet 650 mg, 650 mg, Oral, Q6H PRN, Icard, Bradley L, DO, 650 mg at 06/02/20 1621 .  albuterol (PROVENTIL) (2.5 MG/3ML) 0.083% nebulizer solution 2.5 mg, 2.5 mg, Nebulization, Q2H PRN, Renee Pain, MD, 2.5 mg at 06/06/2020 0415 .  aspirin chewable tablet 81 mg, 81 mg, Per Tube, Daily, Julian Hy, DO, 81 mg at 06/03/20 0949 .  budesonide (PULMICORT) nebulizer solution 0.5 mg, 0.5 mg, Nebulization, BID, Renee Pain, MD, 0.5 mg at 06/03/20 0804 .  cefTRIAXone (ROCEPHIN) 2 g in sodium chloride 0.9 % 100 mL IVPB, 2 g, Intravenous, Q24H, Icard, Bradley L, DO, Stopped at 06/02/20 1155 .  chlorhexidine gluconate (MEDLINE KIT) (PERIDEX) 0.12 % solution 15 mL, 15 mL, Mouth Rinse, BID, Corey Harold, NP, 15 mL at 06/03/20 0750 .  Chlorhexidine Gluconate Cloth 2 % PADS 6 each, 6 each, Topical, Daily, Cheryll Dessert, MD, 6 each at 06/03/20 0024 .  dexmedetomidine (PRECEDEX) 400 MCG/100ML (4 mcg/mL) infusion, 0.4-1.2 mcg/kg/hr, Intravenous, Titrated, Icard, Bradley L, DO, Last Rate: 4.79 mL/hr at 06/03/20 1100, 0.2 mcg/kg/hr at 06/03/20 1100 .  docusate (COLACE) 50 MG/5ML liquid 100 mg, 100 mg, Per Tube, BID PRN, Renee Pain, MD .  docusate (COLACE) 50 MG/5ML liquid 100 mg, 100 mg, Per Tube, BID, Renee Pain, MD, 100 mg at 06/03/20 0948 .  enoxaparin (LOVENOX) injection 30 mg, 30 mg, Subcutaneous, Q24H, Julian Hy, DO, 30 mg at 06/02/20 2120 .  feeding supplement (PROSource TF) liquid 45 mL, 45 mL, Per Tube, TID, Icard, Bradley L, DO, 45 mL at 06/03/20 0949 .  feeding supplement (VITAL HIGH PROTEIN) liquid 1,000 mL, 1,000 mL, Per Tube, Q24H, Icard, Bradley L, DO, 1,000 mL at 06/02/20 1538 .  fentaNYL (SUBLIMAZE)  injection 50 mcg, 50 mcg, Intravenous, Q15 min PRN, Renee Pain, MD .  fentaNYL (SUBLIMAZE) injection 50-200 mcg, 50-200 mcg, Intravenous, Q30 min PRN, Renee Pain, MD, 50 mcg at 06/03/20 1145 .  fentaNYL 2548mg in NS 2569m(1018mml) infusion-PREMIX, 0-200 mcg/hr, Intravenous, Continuous, Icard, Bradley L, DO, Last Rate: 4.5 mL/hr at 06/03/20 1100, 45 mcg/hr at 06/03/20 1100 .  heparin lock flush 100 unit/mL, 500 Units, Intravenous, Once, Icard, Bradley L, DO .  insulin aspart (novoLOG) injection 0-20 Units, 0-20 Units, Subcutaneous, Q4H, JeoRenee PainD, 7 Units at 06/03/20 0749 .  insulin aspart (novoLOG) injection 4 Units, 4 Units, Subcutaneous, Q4H, Icard, Bradley L, DO, 4 Units at 06/03/20 0749 .  insulin glargine (LANTUS) injection 15 Units, 15 Units, Subcutaneous, BID, Icard, Bradley L, DO, 15 Units at 06/03/20 0955 .  ipratropium-albuterol (DUONEB) 0.5-2.5 (3) MG/3ML nebulizer solution 3 mL, 3 mL, Nebulization, Q6H, JeoRenee PainD, 3 mL at 06/03/20 0805 .  MEDLINE mouth rinse, 15 mL, Mouth Rinse, 10 times per day, HofCorey HaroldP, 15 mL at 06/03/20 0954 .  ondansetron (ZOFRAN) injection 4 mg, 4 mg, Intravenous, Q6H PRN, JeoRenee PainD .  pantoprazole (PROTONIX) injection 40 mg, 40 mg, Intravenous, QHS, JeoRenee PainD, 40 mg at 06/02/20 2119 .  polyethylene glycol (MIRALAX / GLYCOLAX) packet 17 g, 17 g, Per Tube, Daily PRN, Renee Pain, MD .  polyethylene glycol (MIRALAX / GLYCOLAX) packet 17 g, 17 g, Per Tube, Daily, Renee Pain, MD, 17 g at 06/03/20 0948 .  rosuvastatin (CRESTOR) tablet 10 mg, 10 mg, Per Tube, Daily, Julian Hy, DO, 10 mg at 06/03/20 6333  Exam: Current vital signs: BP 124/67 (BP Location: Right Arm)   Pulse 70   Temp 99.5 F (37.5 C) (Oral)   Resp 12   Ht _0  (1.753 m)   Wt 96.1 kg   SpO2 97%   BMI 31.29 kg/m  Vital signs in last 24 hours: Temp:  [98.8 F (37.1 C)-101.3 F (38.5 C)] 99.5 F (37.5 C)  (04/13 0741) Pulse Rate:  [65-114] 70 (04/13 1100) Resp:  [12-25] 12 (04/13 0741) BP: (103-143)/(52-107) 124/67 (04/13 1100) SpO2:  [94 %-100 %] 97 % (04/13 1100) FiO2 (%):  [40 %] 40 % (04/13 1100) Weight:  [96.1 kg] 96.1 kg (04/13 0427)  GENERAL: Intubated and sedated, in no acute distress Head: Normocephalic and atraumatic, without obvious abnormality EENT: ETT in place and secured at the right mouth. Conjunctival edema present bilaterally. LUNGS: Respirations via mechanical ventilator, no spontaneous respirations during assessment CV: Regular rate and rhythm on cardiac monitor ABDOMEN: Soft, non-distended Ext: warm, well perfused  NEURO:  Mental Status: Comatose. Patient is sedated on Precedex and with Fentanyl analgesic. Patient does not follow commands, spontaneous eye opening but not consistent with noxious stimulation. Patient does not fixate or track with eye opening.  Speech/Language: mute, ETT in place.   Unable to assess orientation, speech, or language due to patient mental status and condition. Cranial Nerves:  II: Pupils irregularly round, equal, sluggishly reactive to light 3 mm.  III, IV, VI: Oculocephalic reflex intact.  V: Does not blink to threat in any visual field.  VII: Face appears symmetric while grimacing. VIII: Unable to assess due to patient's condition IX, X: Cough and gag reflex intact with inline suctioning of ETT.   XI: Head appears midline XII: Unable to assess due to patient mental status and condition.  Motor: Bilateral upper extremities withdrawal to application of noxious stimuli. Lower extremities flaccid. Tone is flaccid. Bulk is normal.  Sensation: Grimaces with heavy application of noxious stimuli to trapezius squeeze bilaterally. Does not grimace with noxious stimuli application in bilateral lower extremities.  Coordination: Unable to assess due to patient's condition DTRs: 2+ throughout.  Plantars: Downgoing bilaterally Gait-  deferred  Labs I have reviewed labs in epic and the results pertinent to this consultation are: CBC    Component Value Date/Time   WBC 11.5 (H) 06/03/2020 0054   RBC 2.99 (L) 06/03/2020 0054   HGB 7.8 (L) 06/03/2020 0054   HGB 11.2 (L) 12/09/2019 1348   HCT 24.7 (L) 06/03/2020 0054   HCT 35.1 (L) 12/09/2019 1348   PLT 117 (L) 06/03/2020 0054   PLT 284 12/09/2019 1348   MCV 82.6 06/03/2020 0054   MCV 84 12/09/2019 1348   MCH 26.1 06/03/2020 0054   MCHC 31.6 06/03/2020 0054   RDW 15.9 (H) 06/03/2020 0054   RDW 14.6 12/09/2019 1348   LYMPHSABS 0.6 (L) 05/29/2020 0333   LYMPHSABS 1.0 12/09/2019 1348   MONOABS 1.1 (H) 06/15/2020 0333   EOSABS 0.0 05/23/2020 0333   EOSABS 0.2 12/09/2019 1348   BASOSABS 0.0 05/30/2020 0333   BASOSABS 0.1 12/09/2019 1348  CMP     Component Value Date/Time  NA 140 06/03/2020 0957   NA 143 12/09/2019 1348   K 5.1 06/03/2020 0957   CL 107 06/03/2020 0957   CO2 25 06/03/2020 0957   GLUCOSE 255 (H) 06/03/2020 0957   BUN 100 (H) 06/03/2020 0957   BUN 58 (H) 12/09/2019 1348   CREATININE 3.70 (H) 06/03/2020 0957   CREATININE 1.04 07/11/2012 1308   CALCIUM 8.1 (L) 06/03/2020 0957   PROT 5.0 (L) 06/03/2020 0054   PROT 5.3 (L) 12/09/2019 1348   ALBUMIN 1.6 (L) 06/03/2020 0054   ALBUMIN 3.2 (L) 12/09/2019 1348   AST 22 06/03/2020 0054   ALT 37 06/03/2020 0054   ALKPHOS 118 06/03/2020 0054   BILITOT 0.7 06/03/2020 0054   BILITOT <0.2 12/09/2019 1348   GFRNONAA 17 (L) 06/03/2020 0957   GFRNONAA 80 07/11/2012 1308   GFRAA 30 (L) 12/09/2019 1348   GFRAA >89 07/11/2012 1308   Drugs of Abuse     Component Value Date/Time   LABOPIA POSITIVE (A) 11/16/2015 2211   COCAINSCRNUR NONE DETECTED 11/16/2015 2211   LABBENZ NONE DETECTED 11/16/2015 2211   AMPHETMU NONE DETECTED 11/16/2015 2211   THCU POSITIVE (A) 11/16/2015 2211   LABBARB NONE DETECTED 11/16/2015 2211    Urinalysis    Component Value Date/Time   COLORURINE YELLOW 06/14/2020 0400    APPEARANCEUR CLOUDY (A) 05/29/2020 0400   APPEARANCEUR Clear 02/05/2019 1357   LABSPEC 1.021 06/14/2020 0400   PHURINE 5.0 05/24/2020 0400   GLUCOSEU >=500 (A) 06/03/2020 0400   HGBUR SMALL (A) 06/04/2020 0400   BILIRUBINUR NEGATIVE 06/15/2020 0400   BILIRUBINUR Negative 02/05/2019 1357   KETONESUR NEGATIVE 06/18/2020 0400   PROTEINUR >=300 (A) 06/15/2020 0400   UROBILINOGEN negative 11/07/2014 1154   UROBILINOGEN 0.2 05/07/2013 0851   NITRITE NEGATIVE 06/06/2020 0400   LEUKOCYTESUR NEGATIVE 05/30/2020 0400   Imaging I have reviewed the images obtained: MRI brain / MRA head: Negative for acute infarct. Mild atrophy and mild white matter changes Negative for intracranial large vessel occlusion. Stenosis distal right PCA.  Assessment: 65 year old male who presented from outside hospital after a witnessed episode of slurred speech preceding a seizure-like episode and subsequent cardiac arrest s/p ROSC. He was found to have an AKI superimposed on CKD stage IV and a blood glucose of over 800 with hyperglycemic hyperosmolar syndrome. He remains intubated and sedated in the ICU with persistent encephalopathy despite management of metabolic derangements and CT/MRI without evidence supporting infarct or anoxic brain injury. - Examination reveals comatose patient with flaccid extremities throughout. Cough, gag, oculocephalic reflex, corneal reflexes and pupillary reaction remain intact. Patient not initiating spontaneous breaths over the set ventilator rate on assessment.  - CT / MRI without acute infarct or anoxic brain injury from cardiac arrest. Lacks supporting evidence for ongoing encephalopathy at this time.   Recommendations: - Repeat MRI brain to evaluate for cerebral edema / hypoxic / anoxic injury - Routine EEG  - Continue to treat toxic/metabolic derangements as you are - Minimize sedating medications as much as tolerable due to severely altered level of consciousness - Neurology will  continue to follow  Brad Mendoza, AGAC-NP [facility] Pager: [phone]  I have seen the patient reviewed the above note.  He has persistent encephalopathy after admission with hyperosmolar state as well as cardiac arrest.  MRI was negative, but I think it might be prudent to repeat this at this time as it might have slightly higher yield at this point.  Certainly anoxic brain injury would be one consideration,  but without clear evidence of this, would like to continue investigating.  I have also seen people with longer duration(e.g. several days) of persistent encephalopathy after hyperosmolar states, but that tends to be in people with higher glucoses.  He did present with seizures, and I would favor repeating an EEG as well, checking an ammonia.  Brad Rack, MD [facility] [phone]  If 7pm- 7am, please page neurology on call as listed in [place].

## 2020-06-04 ENCOUNTER — Inpatient Hospital Stay (HOSPITAL_COMMUNITY): Payer: Medicare Other

## 2020-06-04 DIAGNOSIS — G931 Anoxic brain damage, not elsewhere classified: Secondary | ICD-10-CM | POA: Diagnosis not present

## 2020-06-04 DIAGNOSIS — I469 Cardiac arrest, cause unspecified: Secondary | ICD-10-CM | POA: Diagnosis not present

## 2020-06-04 DIAGNOSIS — I509 Heart failure, unspecified: Secondary | ICD-10-CM

## 2020-06-04 DIAGNOSIS — R0603 Acute respiratory distress: Secondary | ICD-10-CM

## 2020-06-04 DIAGNOSIS — N184 Chronic kidney disease, stage 4 (severe): Secondary | ICD-10-CM | POA: Diagnosis not present

## 2020-06-04 DIAGNOSIS — N179 Acute kidney failure, unspecified: Secondary | ICD-10-CM | POA: Diagnosis not present

## 2020-06-04 DIAGNOSIS — J9601 Acute respiratory failure with hypoxia: Secondary | ICD-10-CM | POA: Diagnosis not present

## 2020-06-04 LAB — GLUCOSE, CAPILLARY
Glucose-Capillary: 90 mg/dL (ref 70–99)
Glucose-Capillary: 124 mg/dL — ABNORMAL HIGH (ref 70–99)
Glucose-Capillary: 201 mg/dL — ABNORMAL HIGH (ref 70–99)
Glucose-Capillary: 191 mg/dL — ABNORMAL HIGH (ref 70–99)
Glucose-Capillary: 142 mg/dL — ABNORMAL HIGH (ref 70–99)
Glucose-Capillary: 138 mg/dL — ABNORMAL HIGH (ref 70–99)

## 2020-06-04 LAB — ECHOCARDIOGRAM LIMITED
Area-P 1/2: 4.31 cm2
Height: 69 in
Single Plane A4C EF: 31.2 %
Weight: 3400.38 oz

## 2020-06-04 LAB — HAPTOGLOBIN: Haptoglobin: 356 mg/dL (ref 32–363)

## 2020-06-04 LAB — COMPREHENSIVE METABOLIC PANEL
ALT: 39 U/L (ref 0–44)
AST: 51 U/L — ABNORMAL HIGH (ref 15–41)
Albumin: 1.4 g/dL — ABNORMAL LOW (ref 3.5–5.0)
Alkaline Phosphatase: 158 U/L — ABNORMAL HIGH (ref 38–126)
Anion gap: 10 (ref 5–15)
BUN: 124 mg/dL — ABNORMAL HIGH (ref 8–23)
CO2: 25 mmol/L (ref 22–32)
Calcium: 8 mg/dL — ABNORMAL LOW (ref 8.9–10.3)
Chloride: 106 mmol/L (ref 98–111)
Creatinine, Ser: 3.86 mg/dL — ABNORMAL HIGH (ref 0.61–1.24)
GFR, Estimated: 17 mL/min — ABNORMAL LOW (ref >60)
Glucose, Bld: 105 mg/dL — ABNORMAL HIGH (ref 70–99)
Potassium: 4.6 mmol/L (ref 3.5–5.1)
Sodium: 141 mmol/L (ref 135–145)
Total Bilirubin: 0.6 mg/dL (ref 0.3–1.2)
Total Protein: 4.8 g/dL — ABNORMAL LOW (ref 6.5–8.1)

## 2020-06-04 LAB — CBC
HCT: 23.5 % — ABNORMAL LOW (ref 39.0–52.0)
Hemoglobin: 7.2 g/dL — ABNORMAL LOW (ref 13.0–17.0)
MCH: 25.8 pg — ABNORMAL LOW (ref 26.0–34.0)
MCHC: 30.6 g/dL (ref 30.0–36.0)
MCV: 84.2 fL (ref 80.0–100.0)
Platelets: 140 10*3/uL — ABNORMAL LOW (ref 150–400)
RBC: 2.79 MIL/uL — ABNORMAL LOW (ref 4.22–5.81)
RDW: 15.8 % — ABNORMAL HIGH (ref 11.5–15.5)
WBC: 9.1 10*3/uL (ref 4.0–10.5)
nRBC: 0.4 % — ABNORMAL HIGH (ref 0.0–0.2)

## 2020-06-04 MED ORDER — CHLORHEXIDINE GLUCONATE 0.12 % MT SOLN
OROMUCOSAL | Status: AC
  Administered 2020-06-04: 15 mL via OROMUCOSAL
  Filled 2020-06-04: qty 15

## 2020-06-04 MED ORDER — FUROSEMIDE 10 MG/ML IJ SOLN: 120.0000 mg | Freq: Once | INTRAVENOUS | Status: DC

## 2020-06-04 MED ORDER — SODIUM CHLORIDE 0.9% FLUSH: 10.0000 mL | INTRAVENOUS | Status: DC | PRN

## 2020-06-04 MED ORDER — STROKE: EARLY STAGES OF RECOVERY BOOK
Freq: Once | Status: AC
  Filled 2020-06-04: qty 1

## 2020-06-04 MED ORDER — SODIUM CHLORIDE 0.9% FLUSH
10.0000 mL | Freq: Two times a day (BID) | INTRAVENOUS | Status: DC
  Administered 2020-06-04 – 2020-06-07 (×8): 10 mL
  Administered 2020-06-08: 30 mL
  Administered 2020-06-08 – 2020-06-11 (×7): 10 mL

## 2020-06-04 NOTE — Progress Notes (Signed)
EEG complete - results pending 

## 2020-06-04 NOTE — Progress Notes (Signed)
Subjective: Significantly more awake today.   Exam: Vitals:   06/04/20 1300 06/04/20 1400  BP: 125/68 132/67  Pulse: 73 70  Resp: 14 15  Temp:    SpO2: 95% 98%   Gen: In bed, NAD Resp: non-labored breathing, no acute distress Abd: soft, nt  Neuro: MS: Eyes open, fixates examiner once, but does not clearly track, does not follow commands.  RJ:JOACZ, blinks to eyelid stimulation bilaterally.  Motor: withdraws to noxious stimulation bilaterally today Sensory:as above  Pertinent Labs: Cr 3.8, BUN 124 A1C 15.4  Impression: 65 yo M with persistent encephalopathy after admission with hyperosmolar state as well as cardiac arrest. His increasing BUN is certainly going to be a confounding factor in his encephalopathy, but with his improvement today and unimpressive imaging, I think that he has a decent prognosis at this point from a neuro perspective. The tiny stroke is incidental and likely represents decompensated small vessel disease.   Recommendations: 1) Lipid panel 2) echo pending 3) continue ASA 4) will continue to follow.   Roland Rack, MD [facility] [phone]  If 7pm- 7am, please page neurology on call as listed in [place].

## 2020-06-04 NOTE — Consult Note (Addendum)
Advanced Heart Failure Team Consult Note   Primary Physician: Patient, No Pcp Per (Inactive) PCP-Cardiologist:  Rozann Lesches, MD HF MD: Dr Aundra Dubin   Reason for Consultation: Heart Failure   HPI:    JUNIOR HUEZO is seen today for evaluation of heart failure at the request of Dr Icad   Mr Cochrane is a 65 year old with history of HTN, DM, CKD stage 3-4, mixed ischemic/nonischemic cardiomyopathy, and CAD. Had ECHO 20202 with EF 30-35, diffuse HK, and normal RV.  Fracture RLE in February. .   2020 had RHC/LHC with with DES to proximal RCA and was placed ticagrelor + aspirin.     LHC 03/2018  Ost 1st Diag lesion is 95% stenosed.  Prox LAD to Mid LAD lesion is 60% stenosed.  2nd Diag lesion is 70% stenosed.  Mid Cx to Dist Cx lesion is 30% stenosed.  Mid RCA lesion is 70% stenosed.  Prox RCA lesion is 90% stenosed.  Presented to [facility] ED via EMS on 4/9 with complaints of with complaints of seizure-like activity and decreased level of consciousness: last normal 1630 with family finding him w/ slurred speech at 1700. As the patient was being transferred into the ambulance he developed agonal respirations and suffered a generalized tonic-clonic seizure. Seizure aborted w/ Versed but patient did wake up from his postictal phase and was bagged in route to the ED. Upon arrival to the ED he was found to be pulseless and underwent CPR. ROSC after 3-4 rounds, non-shockable rhythm - Epi x3, amiodarone, calcium.  Intubated then had NSVT. CT no acute process.   Transferred to [facility]10 for further neuro work up. ECHO with EF 20-25%. None specific EEG. MRI/MRA brian unremarkable. Hgb A1C 15 and blood glucose 800. Neurology consulted for persistent encephalopathy. EEG/CT/MRI no acute findings. Unable to wean vent. Sedation turned off earlier today. Developed AKI. Nephrology consulted.   Review of Systems: [y] = yes, [ ]  = no  History obtained from chart due to  intubation.  . General: Weight gain [ ] ; Weight loss [ ] ; Anorexia [ ] ; Fatigue [ ] ; Fever [ ] ; Chills [ ] ; Weakness [ ]   . Cardiac: Chest pain/pressure [ ] ; Resting SOB [ ] ; Exertional SOB [ ] ; Orthopnea [ ] ; Pedal Edema [ ] ; Palpitations [ ] ; Syncope [ ] ; Presyncope [ ] ; Paroxysmal nocturnal dyspnea[ ]   . Pulmonary: Cough [ ] ; Wheezing[ ] ; Hemoptysis[ ] ; Sputum [ ] ; Snoring [ ]   . GI: Vomiting[ ] ; Dysphagia[ ] ; Melena[ ] ; Hematochezia [ ] ; Heartburn[ ] ; Abdominal pain [ ] ; Constipation [ ] ; Diarrhea [ ] ; BRBPR [ ]   . GU: Hematuria[ ] ; Dysuria [ ] ; Nocturia[ ]   . Vascular: Pain in legs with walking [ ] ; Pain in feet with lying flat [ ] ; Non-healing sores [ ] ; Stroke [ ] ; TIA [ ] ; Slurred speech [ ] ;  . Neuro: Headaches[ ] ; Vertigo[ ] ; Seizures[ ] ; Paresthesias[ ] ;Blurred vision [ ] ; Diplopia [ ] ; Vision changes [ ]   . Ortho/Skin: Arthritis [ ] ; Joint pain [Y ]; Muscle pain [ ] ; Joint swelling [ ] ; Back Pain [ ] ; Rash [ ]   . Psych: Depression[ ] ; Anxiety[ ]   . Heme: Bleeding problems [ ] ; Clotting disorders [ ] ; Anemia [ ]   . Endocrine: Diabetes [ Y]; Thyroid dysfunction[ ]   Home Medications Prior to Admission medications   Medication Sig Start Date End Date Taking? Authorizing Provider  aspirin EC 81 MG tablet Take 81 mg by mouth daily.  Yes [provider]  carvedilol (COREG) 6.25 MG tablet TAKE 1 AND 1/2 TABLET BY MOUTH TWICE DAILY. Patient taking differently: Take 9.375 mg by mouth 2 (two) times daily with a meal. 04/22/20  Yes Larey Dresser, MD  fluticasone (FLONASE) 50 MCG/ACT nasal spray INSTILL 2 SPRAYS INTO EACH NOSTRIL DAILY. Patient taking differently: Place 2 sprays into both nostrils daily as needed for allergies. 01/20/20  Yes Stacks, Cletus Gash, MD  hydrALAZINE (APRESOLINE) 100 MG tablet TAKE (1) TABLET BY MOUTH (3) TIMES DAILY. Patient taking differently: Take 100 mg by mouth 3 (three) times daily. 12/23/19  Yes Stacks, Cletus Gash, MD  insulin glargine, 2 Unit Dial, (TOUJEO  MAX SOLOSTAR) 300 UNIT/ML Solostar Pen Inject 10 Units into the skin at bedtime. Patient taking differently: Inject 40-50 Units into the skin See admin instructions. Inject 50 units into the skin in the morning before breakfast and inject 40 units into the skin in the evening before supper. 02/07/20  Yes Tat, Shanon Brow, MD  isosorbide mononitrate (IMDUR) 120 MG 24 hr tablet TAKE (1) TABLET BY MOUTH ONCE DAILY. Patient taking differently: Take 120 mg by mouth daily. 12/23/19  Yes Stacks, Cletus Gash, MD  nitroGLYCERIN (NITROSTAT) 0.4 MG SL tablet DISSOLVE 1 TAB UNDER TOUNGE FOR CHEST PAIN. MAY REPEAT EVERY 5 MINUTES FOR 3 DOSES. IF NO RELIEF CALL 911 OR GO TO ER Patient taking differently: Place 0.4 mg under the tongue every 5 (five) minutes x 3 doses as needed for chest pain. 09/20/18  Yes [name], MD  pantoprazole (PROTONIX) 40 MG tablet Take 1 tablet (40 mg total) by mouth daily. 07/11/19  Yes Stacks, Cletus Gash, MD  potassium chloride SA (KLOR-CON) 20 MEQ tablet Take 20 mEq by mouth daily. 05/22/20  Yes [provider]  PROAIR HFA 108 (90 Base) MCG/ACT inhaler USE 2 PUFFS EVERY 6 HOURS AS NEEDED Patient taking differently: Inhale 2 puffs into the lungs every 6 (six) hours as needed for wheezing or shortness of breath. 11/11/19  Yes Stacks, Cletus Gash, MD  ranolazine (RANEXA) 500 MG 12 hr tablet TAKE 1 TABLET BY MOUTH TWICE DAILY. Patient taking differently: Take 500 mg by mouth 2 (two) times daily. 06/24/19  Yes Larey Dresser, MD  rosuvastatin (CRESTOR) 10 MG tablet TAKE 1 TABLET BY MOUTH ONCE A DAY. Patient taking differently: Take 10 mg by mouth daily. 12/23/19  Yes Stacks, Cletus Gash, MD  Semaglutide, 1 MG/DOSE, (OZEMPIC, 1 MG/DOSE,) 2 MG/1.5ML SOPN Inject 1 mg into the skin once a week. 12/09/19  Yes Stacks, Cletus Gash, MD  spironolactone (ALDACTONE) 25 MG tablet Take 25 mg by mouth 2 (two) times daily. 03/19/20  Yes [provider]  torsemide (DEMADEX) 20 MG tablet Take 3 tablets (60 mg total) by  mouth 2 (two) times daily. Patient taking differently: Take 80 mg by mouth 2 (two) times daily. 02/07/20  Yes Tat, Shanon Brow, MD  XARELTO 2.5 MG TABS tablet TAKE (1) TABLET BY MOUTH TWICE DAILY. Patient taking differently: Take 2.5 mg by mouth 2 (two) times daily. 09/27/19  Yes Evans Lance, MD  blood glucose meter kit and supplies KIT Dispense based on patient and insurance preference. Test four times daily as directed. (FOR ICD-10 : E11.9 03/21/19   [name], MD  Blood Glucose Monitoring Suppl (ONETOUCH VERIO) w/Device KIT 1 Device by Does not apply route 2 (two) times daily. 04/01/19   Loman Brooklyn, FNP  Continuous Blood Gluc Sensor (FREESTYLE LIBRE 2 SENSOR) MISC Use to test blood sugar up to 6 times daily  as directed. E11.9 09/23/19   [name], MD  glucose blood (ONETOUCH VERIO) test strip USE FOUR TIMES DAILY AS NEEDED Dx E11.9 08/30/19   [name], MD  HYDROcodone-acetaminophen (NORCO/VICODIN) 5-325 MG tablet Take 1 tablet by mouth every 6 (six) hours as needed for moderate pain. Patient not taking: No sig reported 04/30/20   Carole Civil, MD  icosapent Ethyl (VASCEPA) 1 g capsule Take 2 capsules (2 g total) by mouth 2 (two) times daily. Patient not taking: No sig reported 02/04/19   Larey Dresser, MD  Lancets [facility] ULTRASOFT) lancets Use as instructed   DX E11.9 08/30/19   [name], MD  loratadine (CLARITIN) 10 MG tablet Take 1 tablet (10 mg total) by mouth daily. Patient not taking: No sig reported 04/12/19   Janora Norlander, DO  oxyCODONE-acetaminophen (PERCOCET/ROXICET) 5-325 MG tablet Take 1 tablet by mouth every 4 (four) hours as needed for severe pain. Patient not taking: No sig reported 04/17/20   Margarita Mail, PA-C  pregabalin (LYRICA) 200 MG capsule Take 2 capsules (400 mg total) by mouth at bedtime. Patient not taking: No sig reported 03/18/19   [name], MD  promethazine (PHENERGAN) 12.5 MG tablet Take 1 tablet (12.5 mg total) by mouth every  6 (six) hours as needed for nausea or vomiting. Patient not taking: No sig reported 05/18/20   Carole Civil, MD  terbinafine (LAMISIL) 1 % cream Apply 1 application topically 2 (two) times daily. Apply to both feet and between toes Patient not taking: No sig reported 04/10/18   [name], MD  tiZANidine (ZANAFLEX) 4 MG tablet Take 1 tablet (4 mg total) by mouth every 6 (six) hours as needed for muscle spasms. Patient not taking: No sig reported 09/03/19   [name], MD  Vitamin D, Ergocalciferol, (DRISDOL) 1.25 MG (50000 UNIT) CAPS capsule Take 1 capsule (50,000 Units total) by mouth every 7 (seven) days. Patient not taking: No sig reported 12/10/19 12/08/20  [name], MD    Past Medical History: Past Medical History:  Diagnosis Date  . CAD (coronary artery disease)    DES to RCA January 2020  . Chronic back pain   . CKD (chronic kidney disease), stage IV ([place])   . Diverticulosis   . Essential hypertension   . GERD (gastroesophageal reflux disease)   . Hyperlipidemia   . ICD (implantable cardioverter-defibrillator) in place    [employer] [facility] [employer] - Dr. Lovena Le, implanted April 2021  . Onychomycosis   . Polysubstance abuse ([place])   . Secondary cardiomyopathy ([place])    Mixed ischemic and nonischemic  . Type 2 diabetes mellitus ([place])     Past Surgical History: Past Surgical History:  Procedure Laterality Date  . APPENDECTOMY    . CARDIAC CATHETERIZATION  2005   4 frech cath  . COLONOSCOPY N/A 08/01/2013   Procedure: COLONOSCOPY;  Surgeon: Rogene Houston, MD;  [facility];  Service: Endoscopy;  Laterality: N/A;  rescheduled to [place] notified pt  . CORONARY STENT INTERVENTION N/A 03/19/2018   Procedure: CORONARY STENT INTERVENTION;  Surgeon: Leonie Man, MD;  Location: [place] CV LAB;  Service: Cardiovascular;  Laterality: N/A;  . ESOPHAGOGASTRODUODENOSCOPY N/A 04/23/2015   Procedure: ESOPHAGOGASTRODUODENOSCOPY (EGD);  Surgeon: Rogene Houston,  MD;  Location: [facility];  Service: Endoscopy;  Laterality: N/A;  1:25  . RIGHT/LEFT HEART CATH AND CORONARY ANGIOGRAPHY N/A 06/03/2016   Procedure: Right/Left Heart Cath and Coronary Angiography;  Surgeon: Belva Crome, MD;  Location: [place] CV LAB;  Service: Cardiovascular;  Laterality: N/A;  . RIGHT/LEFT HEART CATH AND CORONARY ANGIOGRAPHY N/A 03/19/2018   Procedure: RIGHT/LEFT HEART CATH AND CORONARY ANGIOGRAPHY;  Surgeon: Jolaine Artist, MD;  Location: [place] [facility] LAB;  Service: Cardiovascular;  Laterality: N/A;  . SUBQ ICD IMPLANT N/A 05/27/2019   Procedure: SUBQ ICD IMPLANT;  Surgeon: Evans Lance, MD;  Location: [place] CV LAB;  Service: Cardiovascular;  Laterality: N/A;    Family History: Family History  Problem Relation Age of Onset  . Hypertension Father   . Diabetes Father   . Lung cancer Mother   . Alcohol abuse Brother   . Colon cancer Neg Hx     Social History: Social History   Socioeconomic History  . Marital status: Legally Separated    Spouse name: Not on file  . Number of children: 2  . Years of education: 4  . Highest education level: 4th grade  Occupational History  . Occupation: disabled  Tobacco Use  . Smoking status: Never Smoker  . Smokeless tobacco: Current User    Types: Snuff  . Tobacco comment: dips snuff about 6 cans/week for 3 years  Vaping Use  . Vaping Use: Never used  Substance and Sexual Activity  . Alcohol use: Yes    Alcohol/week: 1.0 standard drink    Types: 1 Cans of beer per week    Comment: occ - has cut down, now once every 2-3 months  . Drug use: Yes    Types: Marijuana    Comment: occasional use - smoking less  . Sexual activity: Not on file  Other Topics Concern  . Not on file  Social History Narrative   Lives in [place] with spouse   Disabled.   Social Determinants of Health   Financial Resource Strain: Not on file  Food Insecurity: Not on file  Transportation Needs: Not on file  Physical  Activity: Not on file  Stress: Not on file  Social Connections: Not on file    Allergies:  Allergies  Allergen Reactions  . Sulfa Antibiotics Shortness Of Breath  . Penicillins Rash and Other (See Comments)    Has patient had a PCN reaction causing immediate rash, facial/tongue/throat swelling, SOB or lightheadedness with hypotension: No Has patient had a PCN reaction causing severe rash involving mucus membranes or skin necrosis: No Has patient had a PCN reaction that required hospitalization No Has patient had a PCN reaction occurring within the last 10 years: No If all of the above answers are "NO", then may proceed with Cephalosporin use.     Objective:    Vital Signs:   Temp:  [98.2 F (36.8 C)-102.7 F (39.3 C)] 98.2 F (36.8 C) (04/14 0738) Pulse Rate:  [64-79] 70 (04/14 0800) Resp:  [12-20] 18 (04/14 0800) BP: (108-140)/(56-75) 140/75 (04/14 0800) SpO2:  [96 %-100 %] 98 % (04/14 0858) FiO2 (%):  [40 %] 40 % (04/14 0858) Weight:  [96.4 kg] 96.4 kg (04/14 0500) Last BM Date:  (PTA)  Weight change: Filed Weights   06/02/20 0337 06/03/20 0427 06/04/20 0500  Weight: 95.7 kg 96.1 kg 96.4 kg    Intake/Output:   Intake/Output Summary (Last 24 hours) at 06/04/2020 0956 Last data filed at 06/04/2020 0700 Gross per 24 hour  Intake 1875.63 ml  Output 1200 ml  Net 675.63 ml      Physical Exam    General: Intubated. Opens eyes.  HEENT: ETT Neck: supple. JVP does not appear  elevated.  . Carotids 2+ bilat; no bruits. No lymphadenopathy or thyromegaly appreciated. Cor: PMI nondisplaced. Regular rate & rhythm. No rubs, gallops or murmurs. Lungs: clear Abdomen: soft, nontender, nondistended. No hepatosplenomegaly. No bruits or masses. Good bowel sounds. Extremities: no cyanosis, clubbing, rash, edema. RLE cast . LLE 2+ edema  Neuro: Intubated Opens eyes does not follow commands.   Telemetry   SR 60-70s   EKG   Sr 63 bpm 4/10    Labs   Basic Metabolic  Panel: Recent Labs  Lab 06/18/2020 0333 05/22/2020 0409 06/15/2020 0758 06/01/20 1024 06/02/20 0118 06/03/20 0054 06/03/20 0957 06/03/20 1038 06/04/20 0424  NA 138   < > 137 137 137 139 140  --  141  K 4.2   < > 4.5 4.3 4.4 5.3* 5.1  --  4.6  CL 106  --  106 106 106 106 107  --  106  CO2 22  --  24 23 23 23 25   --  25  GLUCOSE 202*  --  194* 230* 220* 163* 255*  --  105*  BUN 45*  --  46* 57* 72* 95* 100*  --  124*  CREATININE 2.81*  --  2.96* 3.05* 3.19* 3.84* 3.70*  --  3.86*  CALCIUM 9.2  --  8.9 8.5* 8.0* 8.1* 8.1*  --  8.0*  MG 2.1  --  2.1  --   --   --   --   --   --   PHOS 4.8*  --   --   --   --   --   --  5.8*  --    < > = values in this interval not displayed.    Liver Function Tests: Recent Labs  Lab 06/04/2020 0333 06/02/20 1702 06/03/20 0054 06/04/20 0424  AST 122* 20 22 51*  ALT 107* 39 37 39  ALKPHOS 118 118 118 158*  BILITOT 0.7 0.8 0.7 0.6  PROT 5.2* 4.6* 5.0* 4.8*  ALBUMIN 2.2* 1.6* 1.6* 1.4*   Recent Labs  Lab 06/01/2020 0333  LIPASE 28  AMYLASE 152*   No results for input(s): AMMONIA in the last 168 hours.  CBC: Recent Labs  Lab 06/02/2020 0333 06/05/2020 0409 06/16/2020 0758 06/01/20 1024 06/02/20 0118 06/02/20 1702 06/03/20 0054 06/03/20 1706 06/04/20 0424  WBC 15.1*  --  14.7* 10.0 9.9  --  11.5*  --  9.1  NEUTROABS 13.3*  --   --   --   --   --   --   --   --   HGB 10.4*   < > 9.5* 9.5* 8.0* 7.9* 7.8* 7.8* 7.2*  HCT 32.2*   < > 29.9* 29.8* 25.0* 25.1* 24.7* 25.6* 23.5*  MCV 82.1  --  81.5 81.6 80.6  --  82.6  --  84.2  PLT 195  --  173 154 136*  --  117*  --  140*   < > = values in this interval not displayed.    Cardiac Enzymes: Recent Labs  Lab 06/03/20 1825  CKTOTAL 175    BNP: BNP (last 3 results) Recent Labs    02/01/20 1848 02/06/20 0631 06/20/2020 0333  BNP 472.0* 194.0* 1,060.0*    ProBNP (last 3 results) No results for input(s): PROBNP in the last 8760 hours.   CBG: Recent Labs  Lab 06/03/20 1549  06/03/20 1921 06/03/20 2318 06/04/20 0323 06/04/20 0722  GLUCAP 165* 152* 130* 90 124*    Coagulation Studies: Recent Labs  06/03/20 1038  LABPROT 14.9  INR 1.2     Imaging   US RENAL  Result Date: 06/03/2020 CLINICAL DATA:  Acute on chronic renal failure EXAM: RENAL / URINARY TRACT ULTRASOUND COMPLETE COMPARISON:  11/28/2018 FINDINGS: Right Kidney: Renal measurements: 12.6 x 6.4 x 6.8 cm = volume: 229 mL. Normal echogenicity. No hydronephrosis. Small amount of perinephric edema. No renal lesion. Left Kidney: Renal measurements: 12.1 x 7.1 x 6.5 cm = volume: 293 mL. Normal echogenicity. No hydronephrosis. Small amount of perinephric edema. No renal lesion. Left kidney is slightly lobulated. Bladder: Bladder is decompressed with a Foley catheter. Foley balloon is visualized. Other: Small amount of perisplenic ascites. IMPRESSION: 1. Negative for hydronephrosis. 2. Small amount of perinephric edema bilaterally. 3. Minimal ascites in the left upper quadrant. Electronically Signed   By: Markus Daft M.D.   On: 06/03/2020 16:29   DG CHEST PORT 1 VIEW  Result Date: 06/03/2020 CLINICAL DATA:  Check line placement EXAM: PORTABLE CHEST 1 VIEW COMPARISON:  06/02/2020 FINDINGS: Cardiac shadow is enlarged but stable. Defibrillator is again noted anteriorly. Gastric catheter and endotracheal tube are noted in satisfactory position. New right jugular temporary dialysis catheter is seen with the catheter tip in the distal superior vena cava. No pneumothorax is seen at this time. Patchy airspace opacities are again identified similar to that seen on the prior exam. IMPRESSION: No pneumothorax following right jugular central line placement. No change from the previous exam. Electronically Signed   By: Inez Catalina M.D.   On: 06/03/2020 12:43      Medications:     Current Medications: . sodium chloride   Intravenous Once  . aspirin  81 mg Per Tube Daily  . budesonide (PULMICORT) nebulizer solution   0.5 mg Nebulization BID  . chlorhexidine gluconate (MEDLINE KIT)  15 mL Mouth Rinse BID  . Chlorhexidine Gluconate Cloth  6 each Topical Daily  . docusate  100 mg Per Tube BID  . enoxaparin (LOVENOX) injection  30 mg Subcutaneous Q24H  . feeding supplement (PROSource TF)  45 mL Per Tube TID  . feeding supplement (VITAL HIGH PROTEIN)  1,000 mL Per Tube Q24H  . insulin aspart  0-20 Units Subcutaneous Q4H  . insulin aspart  4 Units Subcutaneous Q4H  . insulin glargine  15 Units Subcutaneous BID  . ipratropium-albuterol  3 mL Nebulization Q6H  . mouth rinse  15 mL Mouth Rinse 10 times per day  . pantoprazole (PROTONIX) IV  40 mg Intravenous QHS  . polyethylene glycol  17 g Per Tube Daily  . rosuvastatin  10 mg Per Tube Daily  . sodium chloride flush  10-40 mL Intracatheter Q12H     Infusions: . cefTRIAXone (ROCEPHIN)  IV Stopped (06/03/20 1235)  . dexmedetomidine (PRECEDEX) IV infusion 0.4 mcg/kg/hr (06/04/20 0700)  . fentaNYL infusion INTRAVENOUS 50 mcg/hr (06/04/20 0700)       Assessment/Plan   1. Cardiac Arrest ? Seizure    2. Acute Hypoxemic Respiratory Failure Remains intubated. Sedation turned off this morning.   3. AKI Creatinine 2.96-->3.9 Creatinine baseline 2.8-3.2  Nephrology consulted.   4. Acute Encephalopathy  - Possible metabolic abnormalities.  - MRI /MRA  4/11 no acute findings.-->4/14 MRI with small acute infact mid right frontal subcortical.  -Neurology following .   5. Seizure Neuro following EEG   - MRI  Sedation off this morning.    6. IDDM  Uncontrolled  Hgb A1C 15.   7. Chronic HFrEF  Mixed NICM/ICM . Echo the  admit EF 20-25% from previous 30-35% in 01/2020.  - Volume status up.   -Hold off on hydralazine/imdur for now .   8. CAD S/p DES to proximal RCA in 1/20.      Length of Stay: 4  Amy Clegg, NP  06/04/2020, 9:56 AM  Advanced Heart Failure Team Pager [phone] (M-F; 7a - 5p)  Please contact [facility] for  night-coverage after hours (4p -7a ) and weekends on [email or web address]  Agree with above.   65 y/o with CAD, systolic HF with mixed ICM/NICM EF 35%, CKD 4 (creatinine 2.6-3.2), DM2 and recent leg fracture. Admitted with seizure and cardiac arrest.  Now intubated. Awake on vent but not really following commands. Creatinine 3.9.  Echo EF 20-25% hstrop 800 -> 551   Co-ox 73%  On exam General: awake on vent withdraws to pain and will localize HEENT: normal + ETT Neck: supple. JVP to jaw. + RIJ trialysis cath  Carotids 2+ bilat; no bruits. No lymphadenopathy or thryomegaly appreciated. Cor: PMI nondisplaced. Regular rate & rhythm. No rubs, gallops or murmurs. Lungs: clear Abdomen: obese soft, nontender, nondistended. No hepatosplenomegaly. No bruits or masses. Good bowel sounds. Extremities: no cyanosis, clubbing, rash, 3+ edema Neuro: awake on vent  Suspect major issues are non-cardiac despite drop in EF from 30-35% -> 20-25%. Co-ox is fine. Volume status markedly elevated in setting of AKI.   Would continue  to try and diurese but suspect he is going to need CVVHD for fluid removal. Trops mildly elevated but coming down. Suspect mostly demand ischemia and not ACS.  Continue to follow CVP and co-ox. Can add inotropes as needed. Prognosis guarded.   CRITICAL CARE Performed by: Glori Bickers  Total critical care time: 55 minutes  Critical care time was exclusive of separately billable procedures and treating other patients.  Critical care was necessary to treat or prevent imminent or life-threatening deterioration.  Critical care was time spent personally by me (independent of midlevel providers or residents) on the following activities: development of treatment plan with patient and/or surrogate as well as nursing, discussions with consultants, evaluation of patient's response to treatment, examination of patient, obtaining history from patient or surrogate, ordering and performing treatments  and interventions, ordering and review of laboratory studies, ordering and review of radiographic studies, pulse oximetry and re-evaluation of patient's condition.   Glori Bickers, MD  2:13 PM

## 2020-06-04 NOTE — Progress Notes (Signed)
Pt was transferred to and from MRI in AM. Prior, during and after procedure PRN Fentanyl was given per order for agitation and compliance of ventilator. RT at the bedside. Suction (Oral and ETT) was preformed prior and after. VS stable. Upon return, reported to RN, Family at the bedside, bed in low position, call bell in reach.

## 2020-06-04 NOTE — Progress Notes (Signed)
  Echocardiogram 2D Echocardiogram has been performed.  Brad Mendoza 06/04/2020, 2:02 PM

## 2020-06-04 NOTE — Progress Notes (Signed)
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 Patient also with a  recent RLE fracture and wheelchair bound presenting to the ED 4/9 with decreased AMS and ? Seizure like activity with slurred speech in the field as well. Patient with agonal respirations and TC seizure in the ambulance treated with Versed. In the ED he went pulseless requiring CPR with ROSC after 3-4 rounds requiring Epi x3 Amiodarone and calcium; patient with a non shockable rhythm but did require intubation for airway protection. CT of the head was negative. Patient also treated with Keppra for the seizures and transferred to [facility] for a MRI. UDS positive for THC. Patient UOP has been dropping over the past few days to the oliguric status for the past 2 days prior to consultation.   Assessment/ Plan:   1. 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 He went through 3-4 rounds of CPR requiring Epi x3, amiodarone and Ca + intubation. UOP has steadily decreased and currently oliguric. CXR ATX. Currently getting a high dose Lasix challenge.  Patient is +1711 ml _0  and likely even more positive after multiple rounds of CPR in the ED. - Renal ultrasound showed nl  sized  kidneys w/ no incr echogen and no cortical thinning.  - Urine microscopy did not reveal an active sediment or any casts suggestive of ATN but he may be early in his course.  - He is  responding to the Lasix with 1.3L over the past 24hrs . - No absolute indication for RRT at this time and would hold for now + eval daily. If azotemia worsens then may need to initiate bec of catabolic state. - Avoid nephrotoxic agents as you are already doing and dose for GFR <20 - Hopefully his renal parameters will start to stabilize over the next few days.  2. HTN -  controlled 3. DM 4. S/p cardiac arrest - not clear the etiology but he has a h/o HFrEF and CASHD   Subjective:   MRI for encephalopathy but no other events overnight. Not on pressors and vitals were stable with no issues oxygenating.    Objective:   BP 140/72   Pulse 72   Temp 98.2 F (36.8 C) (Oral)   Resp 17   Ht _1  (1.753 m)   Wt 96.4 kg   SpO2 98%   BMI 31.38 kg/m   Intake/Output Summary (Last 24 hours) at 06/04/2020 1107 Last data filed at 06/04/2020 1000 Gross per 24 hour  Intake 1772.67 ml  Output 1345 ml  Net 427.67 ml   Weight change: 0.3 kg  Physical Exam: General: NAD, intubated HEENT:  ETT, conj pallor Lungs: CTA B/L Cardiovascular:RRR,  no JVD Abdomen: soft, non-distended decr BS Extremities: tr edema noted GU: foley in place     Imaging: US RENAL  Result Date: 06/03/2020 CLINICAL DATA:  Acute on chronic renal failure EXAM: RENAL / URINARY TRACT ULTRASOUND COMPLETE COMPARISON:  11/28/2018 FINDINGS: Right Kidney: Renal measurements: 12.6 x 6.4 x 6.8 cm = volume: 229 mL. Normal echogenicity. No hydronephrosis. Small amount of perinephric edema. No renal lesion. Left Kidney: Renal measurements: 12.1 x 7.1 x 6.5 cm = volume: 293 mL. Normal echogenicity. No hydronephrosis. Small amount of perinephric edema. No renal lesion. Left kidney is slightly lobulated. Bladder: Bladder  is decompressed with a Foley catheter. Foley balloon is visualized. Other: Small amount of perisplenic ascites. IMPRESSION: 1. Negative for hydronephrosis. 2. Small amount of perinephric edema bilaterally. 3. Minimal ascites in the left upper quadrant. Electronically Signed   By: Markus Daft M.D.   On: 06/03/2020 16:29   DG CHEST PORT 1 VIEW  Result Date: 06/03/2020 CLINICAL DATA:  Check line placement EXAM: PORTABLE CHEST 1 VIEW COMPARISON:  06/02/2020 FINDINGS: Cardiac shadow is enlarged but stable. Defibrillator is again noted anteriorly. Gastric catheter and endotracheal tube are noted  in satisfactory position. New right jugular temporary dialysis catheter is seen with the catheter tip in the distal superior vena cava. No pneumothorax is seen at this time. Patchy airspace opacities are again identified similar to that seen on the prior exam. IMPRESSION: No pneumothorax following right jugular central line placement. No change from the previous exam. Electronically Signed   By: Inez Catalina M.D.   On: 06/03/2020 12:43   DG CHEST PORT 1 VIEW  Result Date: 06/02/2020 CLINICAL DATA:  Pneumonia. EXAM: PORTABLE CHEST 1 VIEW COMPARISON:  June 01, 2020. FINDINGS: Stable cardiomediastinal silhouette. Endotracheal and nasogastric tubes are unchanged in position. No pneumothorax is noted. Mild bibasilar subsegmental atelectasis is noted. Bony thorax is unremarkable. IMPRESSION: Stable support apparatus.  Mild bibasilar subsegmental atelectasis. Electronically Signed   By: Marijo Conception M.D.   On: 06/02/2020 17:33    Labs: BMET Recent Labs  Lab 06/05/2020 0333 06/08/2020 0409 06/01/2020 0758 06/01/20 1024 06/02/20 0118 06/03/20 0054 06/03/20 0957 06/03/20 1038 06/04/20 0424  NA 138 138 137 137 137 139 140  --  141  K 4.2 4.3 4.5 4.3 4.4 5.3* 5.1  --  4.6  CL 106  --  106 106 106 106 107  --  106  CO2 22  --  _0 --  25  GLUCOSE 202*  --  194* 230* 220* 163* 255*  --  105*  BUN 45*  --  46* 57* 72* 95* 100*  --  124*  CREATININE 2.81*  --  2.96* 3.05* 3.19* 3.84* 3.70*  --  3.86*  CALCIUM 9.2  --  8.9 8.5* 8.0* 8.1* 8.1*  --  8.0*  PHOS 4.8*  --   --   --   --   --   --  5.8*  --    CBC Recent Labs  Lab 05/24/2020 0333 06/08/2020 0409 06/01/20 1024 06/02/20 0118 06/02/20 1702 06/03/20 0054 06/03/20 1706 06/04/20 0424  WBC 15.1*   < > 10.0 9.9  --  11.5*  --  9.1  NEUTROABS 13.3*  --   --   --   --   --   --   --   HGB 10.4*   < > 9.5* 8.0* 7.9* 7.8* 7.8* 7.2*  HCT 32.2*   < > 29.8* 25.0* 25.1* 24.7* 25.6* 23.5*  MCV 82.1   < > 81.6 80.6  --  82.6  --  84.2   PLT 195   < > 154 136*  --  117*  --  140*   < > = values in this interval not displayed.    Medications:    . sodium chloride   Intravenous Once  . aspirin  81 mg Per Tube Daily  . budesonide (PULMICORT) nebulizer solution  0.5 mg Nebulization BID  . chlorhexidine gluconate (MEDLINE KIT)  15 mL Mouth Rinse BID  . Chlorhexidine Gluconate Cloth  6 each Topical  Daily  . docusate  100 mg Per Tube BID  . enoxaparin (LOVENOX) injection  30 mg Subcutaneous Q24H  . feeding supplement (PROSource TF)  45 mL Per Tube TID  . feeding supplement (VITAL HIGH PROTEIN)  1,000 mL Per Tube Q24H  . insulin aspart  0-20 Units Subcutaneous Q4H  . insulin aspart  4 Units Subcutaneous Q4H  . insulin glargine  15 Units Subcutaneous BID  . ipratropium-albuterol  3 mL Nebulization Q6H  . mouth rinse  15 mL Mouth Rinse 10 times per day  . pantoprazole (PROTONIX) IV  40 mg Intravenous QHS  . polyethylene glycol  17 g Per Tube Daily  . rosuvastatin  10 mg Per Tube Daily  . sodium chloride flush  10-40 mL Intracatheter Q12H      Otelia Santee, MD 06/04/2020, 11:07 AM

## 2020-06-04 NOTE — Progress Notes (Addendum)
NAME:  Brad Mendoza, MRN:  [id number], DOB:  04-28-1955, LOS: 4 ADMISSION DATE:  05/26/2020, CONSULTATION DATE:  4/10  REFERRING MD:  Dr. Otelia Limes, CHIEF COMPLAINT:  Cardiac Arrest    History of Present Illness:  Patient is intubated and sedated; therefore history has been obtained from chart review.  Brad Mendoza is a 65 y.o. 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 Patient presented to the [facility] ED on 4/9 with complaints of seizure-like activity and decreased level of consciousness: last normal 1630 with family finding him w/ slurred speech at 1700. As the patient was being transferred into the ambulance he developed agonal respirations and suffered a generalized tonic-clonic seizure. Seizure aborted w/ Versed but patient did wake up from his postictal phase and was bagged in route to the ED. Upon arrival to the ED he was found to be pulseless and underwent CPR. ROSC after 3-4 rounds, non-shockable rhythm - Epi x3, amiodarone, calcium. Pt had copies projectile emesis after ROSC; agonal respiration; OG dropped before, suctioned, then uncomplicated intubation Non-sustained VT afterwards, followed by sinus tach. Initial BPs with systolic in the 324M. Head CT at OSH showed no acute processes. Telemetry neurology was involved and recommended a CTA; however, was not preformed 2/2 CKD/elevated serum creatinine. [facility] did not have MRI capabilities and the patient was referred for transfer to [facility]. MRI on 4/11 did not indicate acute ischemic or hemorrhagic process and was not c/w anoxic brain injury.    Pertinent  Medical History  CAD s/p DES RCA, HFrEF s/p ICD CKD IV HTN, HLD, T2DM GERD Chronic back pain  Significant Hospital Events: Including procedures, antibiotic start and stop dates in addition to  other pertinent events    4/9 Sz, cardiac arrest  4/10 tx to [facility] for ICU care  4/10 nonspecific EEG  4/11 largely unremarkable MRI/MRA 06/03/2020 right internal jugular hemodialysis catheter placed without difficulty>>  Interim History / Subjective:  Reason for mechanical ventilatory support.  Nursing staff during the night states he becomes dyssynchronous with the ventilator when sedation is decreased.  06/04/2020 0 800 all sedation is turned off to better evaluate neurological status.   Objective   Blood pressure 125/62, pulse 64, temperature 98.4 F (36.9 C), temperature source Oral, resp. rate 14, height 5\' 9"  (1.753 m), weight 96.4 kg, SpO2 100 %.    Vent Mode: PRVC FiO2 (%):  [40 %] 40 % Set Rate:  [12 bmp] 12 bmp Vt Set:  [560 mL] 560 mL PEEP:  [5 cmH20] 5 cmH20 Plateau Pressure:  [14 cmH20-17 cmH20] 16 cmH20   Intake/Output Summary (Last 24 hours) at 06/04/2020 0726 Last data filed at 06/04/2020 0700 Gross per 24 hour  Intake 2015.96 ml  Output 1350 ml  Net 665.96 ml   Filed Weights   06/02/20 0337 06/03/20 0427 06/04/20 0500  Weight: 95.7 kg 96.1 kg 96.4 kg    Examination: General: 65 year old male appears older than stated age 31: Endotracheal tube gastric tube in place.  Pupils are equal sluggish 4 mm Neuro: Response to noxious stimuli is withdrawal of upper extremities.  Positive gag reflex positive doll's eyes. CV: Heart sounds are regular regular rate and rhythm PULM: Decreased breath sounds at the bases Vent pressure regulated volume control FIO2 40% PEEP 5 RATE 12/2 overbreathing VT 560  GI: Firm, bsx4 active, tube feedings currently infusing GU: Foley catheter with decreased urine output Extremities: warm/dry, negative edema, right lower extremity with short leg cast warm toes good capillary fill Skin: no rashes or lesions   Labs/imaging that I havepersonally reviewed  (right click and "Reselect all SmartList Selections" daily)   CBC BMP   Resolved Hospital Problem list   N/A  Assessment & Plan:   Brad Mendoza is a 65 y.o. male with a relevant PMHx of CAD s/p RCA DES (02/2018), HFrEF s/p ICD (EF~20-30%), CKD IV, T2DM c/b poor glycemic control (12/21 A1c: 8.1 > 05/30/2020 15.4), and HTN a/f seizure-like activity, AMS followed by cardiac arrest. Patient presentation c/f CVA or anoxic encephalopathy but head CT and MRI/MRA demonstrated no acute processes or diffuse injuries. Pt was found to have a BG in the 800s and an A1c of 15.4, which makes a provoked seizure 2/2 HHS a likely etiology given non-specific EEG and unremarkable imaging results. Patient remains critically ill with unknown neurologic status. Will need to attempt extubation and establish neurologic function.  Currently 06/04/2020 mental status precludes extubation.  Continue to wean as tolerated and reassess mental status.  Cardiac Arrest  Known history of coronary disease and chronic HFrEF increases his risk for sudden cardiac arrest. Cardiac arrest likely 2/2 to seizure (per above). Will need to assess neurologic function following extubation.   No causative factor identified  Continue to monitor in ICU  Acute hypoxemic respiratory failure Poor airway protection post code. Required intubation.   wean per protocol Mental status is a block to weaning Hold sedation for now  C/f Aspir Desaiatoin PNA vs Pneumonitis Following ROSC patient had emesis c/f aspiration. Steadily increasing fever that started overnight in setting of net downtrending WBC (14.7 > 10 > 9.9 > 11.5).  Less likely to be VAP given timeframe.   Continue antimicrobial therapy, consider stop date at 5 to 7 days Sputum culture from 06/16/2020 normal flora  AKI on CKD IV Lab Results  Component Value Date   CREATININE 3.86 (H) 06/04/2020   CREATININE 3.70 (H) 06/03/2020   CREATININE 3.84 (H) 06/03/2020   CREATININE 1.04 07/11/2012    Renal function continues to decline  Hemodialysis  catheter placed on 06/03/2020 May need dialysis in the future Hold diuresis 4/14 Appreciate nephrology's input [facility] worried about low flies aircraft replacement   Micro-normocytic Anemia  Thrombocytopenia  Decreased Hemoglobin  Recent Labs    06/03/20 1706 06/04/20 0424  HGB 7.8* 7.2*    Transfuse per protocol   Acute encephalopathy Evidence of slurred speech and AMS prior to arrest. Given MRI/MRA most likely dx is metabolic derangement. Have been unable to extubate patient or get significant clinical response (but has remained sedated).    MRI head negative Hold sedation and attempt to better establish neurological baseline   Seizure preceded by acute encephalopathy  Pre-cardiac arrest. Likely provoked seizure 2/2 uncontrolled hyperglycemia. UA negative for ketones. No significant PTA ETOH use. UDS +TCH. No seizure-contributing meds, but his children are unsure what pain med he has been taking for his leg (concern for possible Ultram use).   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 CAD s/p DES to RCA 02/2018  Continue statin and aspirin Currently on Lovenox Consider resuming full anticoagulation with Xarelto due to history of coronary artery disease.  Consider heparin drip  care Consider repeat 2D echo on 05/24/2020 EF of 20%.   Consider cardiology consult 06/04/2020 Continue to monitor in intensive care unit  Substance abuse History per EMR: UDS only positive for THC. Likely only  short-term use of opiates from recent foot fracture.   Monitor for withdrawal symptoms   Best practice (right click and "Reselect all SmartList Selections" daily)  Diet:  Tube Feed  Pain/Anxiety/Delirium protocol (if indicated): Yes (RASS goal 0) VAP protocol (if indicated): Yes DVT prophylaxis: LMWH GI prophylaxis: PPI Glucose control:  SSI Yes and Basal insulin Yes Central venous access:  Yes, and it is still needed Arterial line:  N/A Foley:  Yes, and it is still needed Mobility:  bed rest  PT consulted: N/A Last date of multidisciplinary goals of care discussion [TBD] Code Status:  full code Disposition: ICU   Labs   CBC: Recent Labs  Lab 05/23/2020 0333 06/20/2020 0409 06/15/2020 0758 06/01/20 1024 06/02/20 0118 06/02/20 1702 06/03/20 0054 06/03/20 1706 06/04/20 0424  WBC 15.1*  --  14.7* 10.0 9.9  --  11.5*  --  9.1  NEUTROABS 13.3*  --   --   --   --   --   --   --   --   HGB 10.4*   < > 9.5* 9.5* 8.0* 7.9* 7.8* 7.8* 7.2*  HCT 32.2*   < > 29.9* 29.8* 25.0* 25.1* 24.7* 25.6* 23.5*  MCV 82.1  --  81.5 81.6 80.6  --  82.6  --  84.2  PLT 195  --  173 154 136*  --  117*  --  140*   < > = values in this interval not displayed.    Basic Metabolic Panel: Recent Labs  Lab 06/04/2020 0333 06/15/2020 0409 05/24/2020 0758 06/01/20 1024 06/02/20 0118 06/03/20 0054 06/03/20 0957 06/03/20 1038 06/04/20 0424  NA 138   < > 137 137 137 139 140  --  141  K 4.2   < > 4.5 4.3 4.4 5.3* 5.1  --  4.6  CL 106  --  106 106 106 106 107  --  106  CO2 22  --  24 23 23 23 25   --  25  GLUCOSE 202*  --  194* 230* 220* 163* 255*  --  105*  BUN 45*  --  46* 57* 72* 95* 100*  --  124*  CREATININE 2.81*  --  2.96* 3.05* 3.19* 3.84* 3.70*  --  3.86*  CALCIUM 9.2  --  8.9 8.5* 8.0* 8.1* 8.1*  --  8.0*  MG 2.1  --  2.1  --   --   --   --   --   --   PHOS 4.8*  --   --   --   --   --   --  5.8*  --    < > = values in this interval not displayed.   GFR: Estimated Creatinine Clearance: 22.2  mL/min (A) (by C-G formula based on SCr of 3.86 mg/dL (H)). Recent Labs  Lab 05/28/2020 0333 06/02/2020 0758 06/01/2020 1300 06/17/2020 1520 06/01/20 1024 06/02/20 0118 06/03/20 0054 06/04/20 0424  PROCALCITON 3.38  --   --   --   --   --   --   --  WBC 15.1* 14.7*  --   --  10.0 9.9 11.5* 9.1  LATICACIDVEN 3.1* 2.8* 2.2* 2.3*  --   --   --   --     Liver Function Tests: Recent Labs  Lab 05/24/2020 0333 06/02/20 1702 06/03/20 0054 06/04/20 0424  AST 122* 20 22 51*  ALT 107* 39 37 39  ALKPHOS 118 118 118 158*  BILITOT 0.7 0.8 0.7 0.6  PROT 5.2* 4.6* 5.0* 4.8*  ALBUMIN 2.2* 1.6* 1.6* 1.4*   Recent Labs  Lab 06/11/2020 0333  LIPASE 28  AMYLASE 152*   No results for input(s): AMMONIA in the last 168 hours.  ABG    Component Value Date/Time   PHART 7.347 (L) 06/03/2020 0409   PCO2ART 43.9 06/07/2020 0409   PO2ART 113 (H) 05/30/2020 0409   HCO3 24.1 06/09/2020 0409   TCO2 25 06/15/2020 0409   ACIDBASEDEF 2.0 06/11/2020 0409   O2SAT 72.6 06/03/2020 1415     Coagulation Profile: Recent Labs  Lab 06/03/20 1038  INR 1.2    Cardiac Enzymes: Recent Labs  Lab 06/03/20 1825  CKTOTAL 175    HbA1C: HB A1C (BAYER DCA - WAIVED)  Date/Time Value Ref Range Status  12/09/2019 01:10 PM 7.8 (H) <7.0 % Final    Comment:                                          Diabetic Adult            <7.0                                       Healthy Adult        4.3 - 5.7                                                           (DCCT/NGSP) American Diabetes Association's Summary of Glycemic Recommendations for Adults with Diabetes: Hemoglobin A1c <7.0%. More stringent glycemic goals (A1c <6.0%) may further reduce complications at the cost of increased risk of hypoglycemia.   09/03/2019 12:51 PM >14.0 (H) <7.0 % Final    Comment:                                          Diabetic Adult            <7.0                                       Healthy Adult        4.3 - 5.7                                                            (DCCT/NGSP) American Diabetes Association's Summary  of Glycemic Recommendations for Adults with Diabetes: Hemoglobin A1c <7.0%. More stringent glycemic goals (A1c <6.0%) may further reduce complications at the cost of increased risk of hypoglycemia.    Hgb A1c MFr Bld  Date/Time Value Ref Range Status  06/03/2020 03:33 AM 15.4 (H) 4.8 - 5.6 % Final    Comment:    (NOTE)         Prediabetes: 5.7 - 6.4         Diabetes: >6.4         Glycemic control for adults with diabetes: <7.0   02/01/2020 06:48 PM 8.1 (H) 4.8 - 5.6 % Final    Comment:    (NOTE) Pre diabetes:          5.7%-6.4%  Diabetes:              >6.4%  Glycemic control for   <7.0% adults with diabetes     CBG: Recent Labs  Lab 06/03/20 1549 06/03/20 1921 06/03/20 2318 06/04/20 0323 06/04/20 0722  GLUCAP 165* 152* 130* 90 124*      App cct 34 min   Richardson Landry Minor ACNP Acute Care Nurse Practitioner [place] Please consult [place] 06/04/2020, 7:27 AM    PCCM Attending:   65 yo M, cardiac arrest, baseline cardiomyopathy, ischemic heart disease. Responded to lasix challenge.   BP 140/72   Pulse 72   Temp 98.2 F (36.8 C) (Oral)   Resp 17   Ht 5\' 9"  (1.753 m)   Wt 96.4 kg   SpO2 98%   BMI 31.38 kg/m   Gen: eldlery male, critically ill HENT: ETT in place, NCAT  Heart: RRR s1 s2  Lungs: BL vented breaths  Abd: soft, nt nd  Labs reviewed: SCR 3.86 Na 141 Hgb 7.2  A: Cardiac arrest  ICM  Acute on chronic systolic heart failure  Acute metabolic encephalopathy DMII, HHS improved  Aspiration pna AKI on CKD IV   P: Appreciate neuro recs  Appreciate nephrology recs May end up on dialysis,RRT need COOX was 73, looks good Mental status precludes extubation at this time.  SAT SBT as tolerated  I think its just taking a long time for meds to clear with renal failure Wean from vent as tolerated, fio2 and peep  This patient is  critically ill with multiple organ system failure; which, requires frequent high complexity decision making, assessment, support, evaluation, and titration of therapies. This was completed through the application of advanced monitoring technologies and extensive interpretation of multiple databases. During this encounter critical care time was devoted to patient care services described in this note for 36 minutes.  Garner Nash, DO [facility] 06/04/2020 10:59 AM

## 2020-06-05 ENCOUNTER — Inpatient Hospital Stay (HOSPITAL_COMMUNITY): Payer: Medicare Other

## 2020-06-05 DIAGNOSIS — I25119 Atherosclerotic heart disease of native coronary artery with unspecified angina pectoris: Secondary | ICD-10-CM

## 2020-06-05 DIAGNOSIS — I509 Heart failure, unspecified: Secondary | ICD-10-CM | POA: Diagnosis not present

## 2020-06-05 DIAGNOSIS — N179 Acute kidney failure, unspecified: Secondary | ICD-10-CM | POA: Diagnosis not present

## 2020-06-05 DIAGNOSIS — J9601 Acute respiratory failure with hypoxia: Secondary | ICD-10-CM | POA: Diagnosis not present

## 2020-06-05 DIAGNOSIS — R569 Unspecified convulsions: Secondary | ICD-10-CM

## 2020-06-05 DIAGNOSIS — I469 Cardiac arrest, cause unspecified: Secondary | ICD-10-CM | POA: Diagnosis not present

## 2020-06-05 DIAGNOSIS — G931 Anoxic brain damage, not elsewhere classified: Secondary | ICD-10-CM | POA: Diagnosis not present

## 2020-06-05 LAB — CBC WITH DIFFERENTIAL/PLATELET
Abs Immature Granulocytes: 0.05 10*3/uL (ref 0.00–0.07)
Basophils Absolute: 0 10*3/uL (ref 0.0–0.1)
Basophils Relative: 0 %
Eosinophils Absolute: 0.2 10*3/uL (ref 0.0–0.5)
Eosinophils Relative: 3 %
HCT: 23.1 % — ABNORMAL LOW (ref 39.0–52.0)
Hemoglobin: 7.1 g/dL — ABNORMAL LOW (ref 13.0–17.0)
Immature Granulocytes: 1 %
Lymphocytes Relative: 9 %
Lymphs Abs: 0.7 10*3/uL (ref 0.7–4.0)
MCH: 26 pg (ref 26.0–34.0)
MCHC: 30.7 g/dL (ref 30.0–36.0)
MCV: 84.6 fL (ref 80.0–100.0)
Monocytes Absolute: 1.2 10*3/uL — ABNORMAL HIGH (ref 0.1–1.0)
Monocytes Relative: 15 %
Neutro Abs: 5.9 10*3/uL (ref 1.7–7.7)
Neutrophils Relative %: 72 %
Platelets: 169 10*3/uL (ref 150–400)
RBC: 2.73 MIL/uL — ABNORMAL LOW (ref 4.22–5.81)
RDW: 15.9 % — ABNORMAL HIGH (ref 11.5–15.5)
WBC: 8.1 10*3/uL (ref 4.0–10.5)
nRBC: 0.2 % (ref 0.0–0.2)

## 2020-06-05 LAB — CULTURE, BLOOD (ROUTINE X 2)
Culture: NO GROWTH
Culture: NO GROWTH
Special Requests: ADEQUATE
Special Requests: ADEQUATE

## 2020-06-05 LAB — POCT I-STAT 7, (LYTES, BLD GAS, ICA,H+H)
Acid-base deficit: 1 mmol/L (ref 0.0–2.0)
Bicarbonate: 23 mmol/L (ref 20.0–28.0)
Calcium, Ion: 1.18 mmol/L (ref 1.15–1.40)
HCT: 24 % — ABNORMAL LOW (ref 39.0–52.0)
Hemoglobin: 8.2 g/dL — ABNORMAL LOW (ref 13.0–17.0)
O2 Saturation: 96 %
Patient temperature: 98.6
Potassium: 3.8 mmol/L (ref 3.5–5.1)
Sodium: 147 mmol/L — ABNORMAL HIGH (ref 135–145)
TCO2: 24 mmol/L (ref 22–32)
pCO2 arterial: 33.5 mmHg (ref 32.0–48.0)
pH, Arterial: 7.444 (ref 7.350–7.450)
pO2, Arterial: 74 mmHg — ABNORMAL LOW (ref 83.0–108.0)

## 2020-06-05 LAB — GLUCOSE, CAPILLARY
Glucose-Capillary: 126 mg/dL — ABNORMAL HIGH (ref 70–99)
Glucose-Capillary: 141 mg/dL — ABNORMAL HIGH (ref 70–99)
Glucose-Capillary: 191 mg/dL — ABNORMAL HIGH (ref 70–99)
Glucose-Capillary: 152 mg/dL — ABNORMAL HIGH (ref 70–99)
Glucose-Capillary: 71 mg/dL (ref 70–99)
Glucose-Capillary: 74 mg/dL (ref 70–99)

## 2020-06-05 LAB — LIPID PANEL
Cholesterol: 104 mg/dL (ref 0–200)
HDL: 22 mg/dL — ABNORMAL LOW (ref >40)
LDL Cholesterol: 63 mg/dL (ref 0–99)
Total CHOL/HDL Ratio: 4.7 RATIO
Triglycerides: 97 mg/dL (ref <150)
VLDL: 19 mg/dL (ref 0–40)

## 2020-06-05 LAB — COMPREHENSIVE METABOLIC PANEL
ALT: 51 U/L — ABNORMAL HIGH (ref 0–44)
AST: 62 U/L — ABNORMAL HIGH (ref 15–41)
Albumin: 1.4 g/dL — ABNORMAL LOW (ref 3.5–5.0)
Alkaline Phosphatase: 208 U/L — ABNORMAL HIGH (ref 38–126)
Anion gap: 10 (ref 5–15)
BUN: 138 mg/dL — ABNORMAL HIGH (ref 8–23)
CO2: 24 mmol/L (ref 22–32)
Calcium: 8.1 mg/dL — ABNORMAL LOW (ref 8.9–10.3)
Chloride: 109 mmol/L (ref 98–111)
Creatinine, Ser: 3.74 mg/dL — ABNORMAL HIGH (ref 0.61–1.24)
GFR, Estimated: 17 mL/min — ABNORMAL LOW (ref >60)
Glucose, Bld: 134 mg/dL — ABNORMAL HIGH (ref 70–99)
Potassium: 4.4 mmol/L (ref 3.5–5.1)
Sodium: 143 mmol/L (ref 135–145)
Total Bilirubin: 0.5 mg/dL (ref 0.3–1.2)
Total Protein: 4.9 g/dL — ABNORMAL LOW (ref 6.5–8.1)

## 2020-06-05 LAB — PHOSPHORUS: Phosphorus: 5.1 mg/dL — ABNORMAL HIGH (ref 2.5–4.6)

## 2020-06-05 LAB — MAGNESIUM: Magnesium: 2.6 mg/dL — ABNORMAL HIGH (ref 1.7–2.4)

## 2020-06-05 MED ORDER — HYDRALAZINE HCL 20 MG/ML IJ SOLN
10.0000 mg | INTRAMUSCULAR | Status: DC | PRN
Start: 2020-06-05 — End: 2020-06-12
  Administered 2020-06-05: 20 mg via INTRAVENOUS
  Administered 2020-06-06: 10 mg via INTRAVENOUS
  Administered 2020-06-06 – 2020-06-11 (×5): 20 mg via INTRAVENOUS
  Filled 2020-06-05 (×7): qty 1

## 2020-06-05 MED ORDER — ORAL CARE MOUTH RINSE
15.0000 mL | Freq: Two times a day (BID) | OROMUCOSAL | Status: DC
  Administered 2020-06-06 – 2020-06-07 (×4): 15 mL via OROMUCOSAL

## 2020-06-05 MED ORDER — DEXTROSE 5 % IV SOLN: INTRAVENOUS | Status: DC

## 2020-06-05 MED ORDER — GLYCOPYRROLATE 0.2 MG/ML IJ SOLN
0.2000 mg | Freq: Once | INTRAMUSCULAR | Status: AC
  Administered 2020-06-06: 0.2 mg via INTRAMUSCULAR
  Filled 2020-06-05: qty 1

## 2020-06-05 MED ORDER — ALBUMIN HUMAN 25 % IV SOLN
25.0000 g | Freq: Four times a day (QID) | INTRAVENOUS | Status: AC
  Administered 2020-06-05 (×2): 25 g via INTRAVENOUS
  Filled 2020-06-05 (×2): qty 100

## 2020-06-05 NOTE — Progress Notes (Signed)
Pt. Placed on BIPAP per Dr. Lucile Shutters. currently on 10/5 PS, VSS, pt. Reaching vt 600-700, RT will continue to monitor

## 2020-06-05 NOTE — Consult Note (Addendum)
ORTHOPAEDIC CONSULTATION  REQUESTING PHYSICIAN: Brad Kindle, MD  Chief Complaint: right ankle fracture  HPI: Brad Mendoza is a 65 y.o. male who has history of trimalleolar right ankle fracture that was sustained on 04/17/2020.  He was seen by Dr. Aline Brochure who placed patient in a short leg cast on 04/30/2020 with the fracture fragments in acceptable orientation.  No operative management was pursued due to significant medical comorbidities.Brad Mendoza  He was told to be nonweightbearing and ambulated with a wheelchair.  Last appointment was on 05/18/2020 with Dr. Aline Brochure who plan to have patient follow-up 1 month from that date with cast removal at that time.  He did have abrasion over the front of the tibia on 3/10.  No documentation of the cast has been changed since application of the cast.  He is now hospitalized in ICU with encephalopathy and following episode of cardiac arrest that required multiple rounds of CPR.  Past Medical History:  Diagnosis Date  . CAD (coronary artery disease)    DES to RCA January 2020  . Chronic back pain   . CKD (chronic kidney disease), stage IV ([place])   . Diverticulosis   . Essential hypertension   . GERD (gastroesophageal reflux disease)   . Hyperlipidemia   . ICD (implantable cardioverter-defibrillator) in place    [facility] - Dr. Lovena Le, implanted April 2021  . Onychomycosis   . Polysubstance abuse ([place])   . 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 Type 2 diabetes mellitus ([place])    Past Surgical History:  Procedure Laterality Date  . APPENDECTOMY    . CARDIAC CATHETERIZATION  2005   4 frech cath  . COLONOSCOPY N/A 08/01/2013   Procedure: COLONOSCOPY;  Surgeon: Rogene Houston, MD;  [facility];  Service: Endoscopy;  Laterality: N/A;  rescheduled to [place] notified pt  . CORONARY STENT INTERVENTION N/A 03/19/2018   Procedure: CORONARY STENT INTERVENTION;  Surgeon: Leonie Man, MD;  Location: [place]  [facility];  Service: Cardiovascular;  Laterality: N/A;  . ESOPHAGOGASTRODUODENOSCOPY N/A 04/23/2015   Procedure: ESOPHAGOGASTRODUODENOSCOPY (EGD);  Surgeon: Rogene Houston, MD;  [facility];  Service: Endoscopy;  Laterality: N/A;  1:25  . RIGHT/LEFT HEART CATH AND CORONARY ANGIOGRAPHY N/A 06/03/2016   Procedure: Right/Left Heart Cath and Coronary Angiography;  Surgeon: Belva Crome, MD;  Location: [place] [facility];  Service: Cardiovascular;  Laterality: N/A;  . RIGHT/LEFT HEART CATH AND CORONARY ANGIOGRAPHY N/A 03/19/2018   Procedure: RIGHT/LEFT HEART CATH AND CORONARY ANGIOGRAPHY;  Surgeon: Jolaine Artist, MD;  Location: [place] [facility];  Service: Cardiovascular;  Laterality: N/A;  . SUBQ ICD IMPLANT N/A 05/27/2019   Procedure: SUBQ ICD IMPLANT;  Surgeon: Evans Lance, MD;  Location: [place] [facility];  Service: Cardiovascular;  Laterality: N/A;   Social History   Socioeconomic History  . Marital status: Legally Separated    Spouse name: Not on file  . Number of children: 2  . Years of education: 4  . Highest education level: 4th grade  Occupational History  . Occupation: disabled  Tobacco Use  . Smoking status: Never Smoker  . Smokeless tobacco: Current User    Types: Snuff  . Tobacco comment: dips snuff about 6 cans/week for 3 years  Vaping Use  . Vaping Use: Never used  Substance and Sexual Activity  . Alcohol use: Yes    Alcohol/week: 1.0 standard drink    Types: 1 Cans of  beer per week    Comment: occ - has cut down, now once every 2-3 months  . Drug use: Yes    Types: Marijuana    Comment: occasional use - smoking less  . Sexual activity: Not on file  Other Topics Concern  . Not on file  Social History Narrative   Lives in [place] with spouse   Disabled.   Social Determinants of Health   Financial Resource Strain: Not on file  Food Insecurity: Not on file  Transportation Needs: Not on file  Physical Activity: Not on file  Stress: Not on  file  Social Connections: Not on file   Family History  Problem Relation Age of Onset  . Hypertension Father   . Diabetes Father   . Lung cancer Mother   . Alcohol abuse Brother   . Colon cancer Neg Hx    - negative except otherwise stated in the family history section Allergies  Allergen Reactions  . Sulfa Antibiotics Shortness Of Breath  . Penicillins Rash and Other (See Comments)    Has patient had a PCN reaction causing immediate rash, facial/tongue/throat swelling, SOB or lightheadedness with hypotension: No Has patient had a PCN reaction causing severe rash involving mucus membranes or skin necrosis: No Has patient had a PCN reaction that required hospitalization No Has patient had a PCN reaction occurring within the last 10 years: No If all of the above answers are "NO", then may proceed with Cephalosporin use.    Prior to Admission medications   Medication Sig Start Date End Date Taking? Authorizing Provider  aspirin EC 81 MG tablet Take 81 mg by mouth daily.   Yes [provider]  carvedilol (COREG) 6.25 MG tablet TAKE 1 AND 1/2 TABLET BY MOUTH TWICE DAILY. Patient taking differently: Take 9.375 mg by mouth 2 (two) times daily with a meal. 04/22/20  Yes Larey Dresser, MD  fluticasone (FLONASE) 50 MCG/ACT nasal spray INSTILL 2 SPRAYS INTO EACH NOSTRIL DAILY. Patient taking differently: Place 2 sprays into both nostrils daily as needed for allergies. 01/20/20  Yes Stacks, Cletus Gash, MD  hydrALAZINE (APRESOLINE) 100 MG tablet TAKE (1) TABLET BY MOUTH (3) TIMES DAILY. Patient taking differently: Take 100 mg by mouth 3 (three) times daily. 12/23/19  Yes Stacks, Cletus Gash, MD  insulin glargine, 2 Unit Dial, (TOUJEO MAX SOLOSTAR) 300 UNIT/ML Solostar Pen Inject 10 Units into the skin at bedtime. Patient taking differently: Inject 40-50 Units into the skin See admin instructions. Inject 50 units into the skin in the morning before breakfast and inject 40 units into the skin in  the evening before supper. 02/07/20  Yes Tat, Shanon Brow, MD  isosorbide mononitrate (IMDUR) 120 MG 24 hr tablet TAKE (1) TABLET BY MOUTH ONCE DAILY. Patient taking differently: Take 120 mg by mouth daily. 12/23/19  Yes Stacks, Cletus Gash, MD  nitroGLYCERIN (NITROSTAT) 0.4 MG SL tablet DISSOLVE 1 TAB UNDER TOUNGE FOR CHEST PAIN. MAY REPEAT EVERY 5 MINUTES FOR 3 DOSES. IF NO RELIEF CALL 911 OR GO TO ER Patient taking differently: Place 0.4 mg under the tongue every 5 (five) minutes x 3 doses as needed for chest pain. 09/20/18  Yes [name], MD  pantoprazole (PROTONIX) 40 MG tablet Take 1 tablet (40 mg total) by mouth daily. 07/11/19  Yes Stacks, Cletus Gash, MD  potassium chloride SA (KLOR-CON) 20 MEQ tablet Take 20 mEq by mouth daily. 05/22/20  Yes [provider]  PROAIR HFA 108 (90 Base) MCG/ACT inhaler USE 2 PUFFS EVERY 6 HOURS  AS NEEDED Patient taking differently: Inhale 2 puffs into the lungs every 6 (six) hours as needed for wheezing or shortness of breath. 11/11/19  Yes Stacks, Cletus Gash, MD  ranolazine (RANEXA) 500 MG 12 hr tablet TAKE 1 TABLET BY MOUTH TWICE DAILY. Patient taking differently: Take 500 mg by mouth 2 (two) times daily. 06/24/19  Yes Larey Dresser, MD  rosuvastatin (CRESTOR) 10 MG tablet TAKE 1 TABLET BY MOUTH ONCE A DAY. Patient taking differently: Take 10 mg by mouth daily. 12/23/19  Yes Stacks, Cletus Gash, MD  Semaglutide, 1 MG/DOSE, (OZEMPIC, 1 MG/DOSE,) 2 MG/1.5ML SOPN Inject 1 mg into the skin once a week. 12/09/19  Yes Stacks, Cletus Gash, MD  spironolactone (ALDACTONE) 25 MG tablet Take 25 mg by mouth 2 (two) times daily. 03/19/20  Yes [provider]  torsemide (DEMADEX) 20 MG tablet Take 3 tablets (60 mg total) by mouth 2 (two) times daily. Patient taking differently: Take 80 mg by mouth 2 (two) times daily. 02/07/20  Yes Tat, Shanon Brow, MD  XARELTO 2.5 MG TABS tablet TAKE (1) TABLET BY MOUTH TWICE DAILY. Patient taking differently: Take 2.5 mg by mouth 2 (two) times daily.  09/27/19  Yes Evans Lance, MD  blood glucose meter kit and supplies KIT Dispense based on patient and insurance preference. Test four times daily as directed. (FOR ICD-10 : E11.9 03/21/19   [name], MD  Blood Glucose Monitoring Suppl (ONETOUCH VERIO) w/Device KIT 1 Device by Does not apply route 2 (two) times daily. 04/01/19   Loman Brooklyn, FNP  Continuous Blood Gluc Sensor (FREESTYLE LIBRE 2 SENSOR) MISC Use to test blood sugar up to 6 times daily as directed. E11.9 09/23/19   [name], MD  glucose blood (ONETOUCH VERIO) test strip USE FOUR TIMES DAILY AS NEEDED Dx E11.9 08/30/19   [name], MD  HYDROcodone-acetaminophen (NORCO/VICODIN) 5-325 MG tablet Take 1 tablet by mouth every 6 (six) hours as needed for moderate pain. Patient not taking: No sig reported 04/30/20   Carole Civil, MD  icosapent Ethyl (VASCEPA) 1 g capsule Take 2 capsules (2 g total) by mouth 2 (two) times daily. Patient not taking: No sig reported 02/04/19   Larey Dresser, MD  Lancets [facility] ULTRASOFT) lancets Use as instructed   DX E11.9 08/30/19   [name], MD  loratadine (CLARITIN) 10 MG tablet Take 1 tablet (10 mg total) by mouth daily. Patient not taking: No sig reported 04/12/19   Janora Norlander, DO  oxyCODONE-acetaminophen (PERCOCET/ROXICET) 5-325 MG tablet Take 1 tablet by mouth every 4 (four) hours as needed for severe pain. Patient not taking: No sig reported 04/17/20   Margarita Mail, PA-C  pregabalin (LYRICA) 200 MG capsule Take 2 capsules (400 mg total) by mouth at bedtime. Patient not taking: No sig reported 03/18/19   [name], MD  promethazine (PHENERGAN) 12.5 MG tablet Take 1 tablet (12.5 mg total) by mouth every 6 (six) hours as needed for nausea or vomiting. Patient not taking: No sig reported 05/18/20   Carole Civil, MD  terbinafine (LAMISIL) 1 % cream Apply 1 application topically 2 (two) times daily. Apply to both feet and between toes Patient not taking: No  sig reported 04/10/18   [name], MD  tiZANidine (ZANAFLEX) 4 MG tablet Take 1 tablet (4 mg total) by mouth every 6 (six) hours as needed for muscle spasms. Patient not taking: No sig reported 09/03/19   [name], MD  Vitamin D, Ergocalciferol, (DRISDOL) 1.25 MG (50000  UNIT) CAPS capsule Take 1 capsule (50,000 Units total) by mouth every 7 (seven) days. Patient not taking: No sig reported 12/10/19 12/08/20  [name], MD   DG Ankle 2 Views Right  Result Date: 06/05/2020 CLINICAL DATA:  Right ankle fracture. EXAM: RIGHT ANKLE - 2 VIEW COMPARISON:  May 18, 2020. FINDINGS: Right ankle has been casted and immobilized. Minimally displaced distal right fibular fracture is noted. IMPRESSION: Casting and immobilization of distal right fibular fracture. Electronically Signed   By: Marijo Conception M.D.   On: 06/05/2020 09:02   MR BRAIN WO CONTRAST  Result Date: 06/04/2020 CLINICAL DATA:  Acute neuro deficit.  Suspect stroke. EXAM: MRI HEAD WITHOUT CONTRAST TECHNIQUE: Multiplanar, multiecho pulse sequences of the brain and surrounding structures were obtained without intravenous contrast. COMPARISON:  MRI head 06/01/2020 FINDINGS: Brain: Small 4 mm area of acute infarct in the mid right frontal subcortical white matter. This is not seen on the prior diffusion-weighted imaging. Motion degraded study. Generalized atrophy. Minimal white matter changes. Negative for hemorrhage or mass. Vascular: Normal arterial flow voids Skull and upper cervical spine: Negative Sinuses/Orbits: Extensive mucosal edema throughout the paranasal sinuses. Patient is intubated. Negative orbit Other: None IMPRESSION: 4 mm area of restricted diffusion in the mid right subcortical white matter compatible with acute infarct, not seen on the recent MRI Motion degraded study. Electronically Signed   By: Franchot Gallo M.D.   On: 06/04/2020 12:57   US RENAL  Result Date: 06/03/2020 CLINICAL DATA:  Acute on chronic renal  failure EXAM: RENAL / URINARY TRACT ULTRASOUND COMPLETE COMPARISON:  11/28/2018 FINDINGS: Right Kidney: Renal measurements: 12.6 x 6.4 x 6.8 cm = volume: 229 mL. Normal echogenicity. No hydronephrosis. Small amount of perinephric edema. No renal lesion. Left Kidney: Renal measurements: 12.1 x 7.1 x 6.5 cm = volume: 293 mL. Normal echogenicity. No hydronephrosis. Small amount of perinephric edema. No renal lesion. Left kidney is slightly lobulated. Bladder: Bladder is decompressed with a Foley catheter. Foley balloon is visualized. Other: Small amount of perisplenic ascites. IMPRESSION: 1. Negative for hydronephrosis. 2. Small amount of perinephric edema bilaterally. 3. Minimal ascites in the left upper quadrant. Electronically Signed   By: Markus Daft M.D.   On: 06/03/2020 16:29   DG Chest Port 1 View  Result Date: 06/05/2020 CLINICAL DATA:  Respiratory failure EXAM: PORTABLE CHEST 1 VIEW COMPARISON:  06/03/2020 FINDINGS: Endotracheal tube seen 2.7 cm above the carina. Nasogastric tube extends into the upper abdomen. Right internal jugular temporary dialysis catheter tip noted within the superior vena cava. Bibasilar pulmonary infiltrates have progressed slightly in the interval, likely infectious or inflammatory. No pneumothorax. No pleural effusion. Mild cardiomegaly is stable. Epicardial defibrillator is unchanged. The pulmonary vascularity is normal. IMPRESSION: Stable support lines and tubes. Progressive bibasilar pulmonary infiltrate, likely infectious or inflammatory. Stable cardiomegaly. Electronically Signed   By: Fidela Salisbury MD   On: 06/05/2020 04:54   DG CHEST PORT 1 VIEW  Result Date: 06/03/2020 CLINICAL DATA:  Check line placement EXAM: PORTABLE CHEST 1 VIEW COMPARISON:  06/02/2020 FINDINGS: Cardiac shadow is enlarged but stable. Defibrillator is again noted anteriorly. Gastric catheter and endotracheal tube are noted in satisfactory position. New right jugular temporary dialysis catheter is  seen with the catheter tip in the distal superior vena cava. No pneumothorax is seen at this time. Patchy airspace opacities are again identified similar to that seen on the prior exam. IMPRESSION: No pneumothorax following right jugular central line placement. No change from the previous exam.  Electronically Signed   By: Inez Catalina M.D.   On: 06/03/2020 12:43   EEG adult  Result Date: 06/05/2020 Lora Havens, MD     06/05/2020  9:43 AM Patient Name: ZEPHAN BEAUCHAINE MRN: [id number] Epilepsy Attending: Lora Havens Referring Physician/Provider: Date: 06/04/2020 Duration: 29.51 mins Patient history: 65 yo M with persistent encephalopathy after admission with hyperosmolar state as well as cardiac arrest. EEG to evaluate for seizure Level of alertness:  lethargic AEDs during EEG study: None Technical aspects: This EEG study was done with scalp electrodes positioned according to the 10-20 International system of electrode placement. Electrical activity was acquired at a sampling rate of 500Hz and reviewed with a high frequency filter of 70Hz and a low frequency filter of 1Hz. EEG data were recorded continuously and digitally stored. Description: EEG showed continuous generalized predominantly 2 to 3 Hz delta as well as 5 to 6 Hz theta slowing. Hyperventilation and photic stimulation were not performed.   ABNORMALITY - Continuous slow, generalized IMPRESSION: This study is suggestive of /severe diffuse encephalopathy, nonspecific etiology. No seizures or epileptiform discharges were seen throughout the recording. Lora Havens   ECHOCARDIOGRAM LIMITED  Result Date: 06/04/2020    ECHOCARDIOGRAM LIMITED REPORT   Patient Name:   NAZAIRE CORDIAL Date of Exam: 06/04/2020 Medical Rec #:  [id number]       Height:       69.0 in Accession #:    [id number]      Weight:       212.5 lb Date of Birth:  [birth year]        BSA:          2.120 m Patient Age:    78 years        BP:           140/75 mmHg Patient Gender: M                HR:           91 bpm. Exam Location:  Inpatient Procedure: Limited Echo, Limited Color Doppler and Color Doppler Indications:    acute respiratory distress  History:        Patient has prior history of Echocardiogram examinations, most                 recent 06/05/2020. Cardiac arrest, CAD, Defibrillator, chronic                 kidney disease, Signs/Symptoms:Altered Mental Status; Risk                 Factors:Current Smoker, Hypertension, Dyslipidemia and Diabetes.  Sonographer:    Johny Chess Referring Phys: [place]  Sonographer Comments: Echo performed with patient supine and on artificial respirator. IMPRESSIONS  1. Left ventricular ejection fraction, by estimation, is 30%. The left ventricle has moderate to severely decreased function. The left ventricle demonstrates global hypokinesis. Left ventricular diastolic parameters are consistent with Grade II diastolic dysfunction (pseudonormalization).  2. Right ventricular systolic function is mildly reduced. The right ventricular size is normal. There is severely elevated pulmonary artery systolic pressure. The estimated right ventricular systolic pressure is 12.7 mmHg.  3. The mitral valve is normal in structure. No evidence of mitral valve regurgitation. No evidence of mitral stenosis.  4. The aortic valve is tricuspid. Aortic valve regurgitation is not visualized. Mild aortic valve sclerosis is present, with no evidence of aortic valve stenosis.  5. The inferior vena cava is dilated  in size with <50% respiratory variability, suggesting right atrial pressure of 15 mmHg. FINDINGS  Left Ventricle: Left ventricular ejection fraction, by estimation, is 30%. The left ventricle has moderate to severely decreased function. The left ventricle demonstrates global hypokinesis. Left ventricular diastolic parameters are consistent with Grade II diastolic dysfunction (pseudonormalization). Right Ventricle: The right ventricular size is normal. No  increase in right ventricular wall thickness. Right ventricular systolic function is mildly reduced. There is severely elevated pulmonary artery systolic pressure. The tricuspid regurgitant velocity is 4.07 m/s, and with an assumed right atrial pressure of 15 mmHg, the estimated right ventricular systolic pressure is 57.2 mmHg. Mitral Valve: The mitral valve is normal in structure. Mild mitral annular calcification. No evidence of mitral valve stenosis. Tricuspid Valve: The tricuspid valve is normal in structure. Tricuspid valve regurgitation is mild. Aortic Valve: The aortic valve is tricuspid. Aortic valve regurgitation is not visualized. Mild aortic valve sclerosis is present, with no evidence of aortic valve stenosis. Venous: The inferior vena cava is dilated in size with less than 50% respiratory variability, suggesting right atrial pressure of 15 mmHg.  LV Volumes (MOD) LV vol d, MOD A4C: 123.0 ml Diastology LV vol s, MOD A4C: 84.6 ml  LV e' medial:    7.29 cm/s LV SV MOD A4C:     123.0 ml LV E/e' medial:  18.0                             LV e' lateral:   9.36 cm/s                             LV E/e' lateral: 14.0  IVC IVC diam: 2.30 cm AORTIC VALVE LVOT Vmax:   78.30 cm/s LVOT Vmean:  51.200 cm/s LVOT VTI:    0.154 m MITRAL VALVE                TRICUSPID VALVE MV Area (PHT): 4.31 cm     TR Peak grad:   66.3 mmHg MV Decel Time: 176 msec     TR Vmax:        407.00 cm/s MV E velocity: 131.00 cm/s MV A velocity: 83.90 cm/s   SHUNTS MV E/A ratio:  1.56         Systemic VTI: 0.15 m Loralie Champagne MD Electronically signed by Loralie Champagne MD Signature Date/Time: 06/04/2020/5:58:46 PM    Final    - pertinent xrays, CT, MRI studies were reviewed and independently interpreted  Positive ROS: All other systems have been reviewed and were otherwise negative with the exception of those mentioned in the HPI and as above.  Physical Exam: General: Alert, no acute distress Psychiatric: Patient is competent for consent  with normal mood and affect Lymphatic: No axillary or cervical lymphadenopathy Cardiovascular: No pedal edema Respiratory: No cyanosis, no use of accessory musculature GI: No organomegaly, abdomen is soft and non-tender    Images:  _0 @  Labs:  Lab Results  Component Value Date   HGBA1C 15.4 (H) 06/04/2020   HGBA1C 8.1 (H) 02/01/2020   HGBA1C 7.8 (H) 12/09/2019   REPTSTATUS 06/05/2020 FINAL 05/24/2020   GRAMSTAIN  06/15/2020    RARE WBC PRESENT, PREDOMINANTLY PMN MODERATE GRAM POSITIVE COCCI IN CHAINS    CULT  06/17/2020    NO GROWTH 5 DAYS Performed at [facility], [address], [place], [place] [address]  Lab Results  Component Value Date   ALBUMIN 1.4 (L) 06/05/2020   ALBUMIN 1.4 (L) 06/04/2020   ALBUMIN 1.6 (L) 06/03/2020    MUSCULOSKELETAL:   Right ankle currently immobilized in a short leg cast.  No ulcerations noted between the toes or the knees to toes.  There is swelling in the right foot.  Right foot is warm and well-perfused.  No abrasions in the proximal tibia or calf and no knee effusion noted.  Assessment: Right ankle trimalleolar fracture  Plan: Patient is about 7 weeks out from trimalleolar fracture of the right ankle.  He has been in his current cast for a little over 5 weeks.  Radiographs reviewed and show fracture in acceptable position with some minimal displacement of the fibular fracture and mild displacement of the medial malleolus fragment.  Lateral and posterior fragments are well-healed and consolidated with very minimal gapping noted in the medial side.  Plan for [facility] to remove cast today and then plan to return for evaluation of skin and soft tissue integrity..  Thank you for the consult and the opportunity to see Mr. Dante, [place] [phone] 11:33 AM

## 2020-06-05 NOTE — Progress Notes (Signed)
RT note. ABG collated at this time, values in normal range. Pt. Is unable to keep a clear airway, also not able to clear secretions. Patient currently on BIPAP per MD, RN aware, RT will continue to monitor.

## 2020-06-05 NOTE — Progress Notes (Signed)
eLink Physician-Brief Progress Note Patient Name: Brad Mendoza DOB: 11-Mar-1955 MRN: [id number]   Date of Service  06/05/2020  HPI/Events of Note  Patient with post-extubation respiratory distress.  eICU Interventions  Stat portable CXR ordered, BIPAP trial followed by ABG, patient may require re-intubation if he fails BIPAP.        [name] Isiaah Cuervo 06/05/2020, 10:25 PM

## 2020-06-05 NOTE — Progress Notes (Signed)
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 Patient also with a  recent RLE fracture and wheelchair bound presenting to the ED 4/9 with decreased AMS and ? Seizure like activity with slurred speech in the field as well. Patient with agonal respirations and TC seizure in the ambulance treated with Versed. In the ED he went pulseless requiring CPR with ROSC after 3-4 rounds requiring Epi x3 Amiodarone and calcium; patient with a non shockable rhythm but did require intubation for airway protection. CT of the head was negative. Patient also treated with Keppra for the seizures and transferred to [facility] for a MRI. UDS positive for THC. Patient UOP has been dropping over the past few days to the oliguric status for the past 2 days prior to consultation.   Assessment/ Plan:   1. 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 He went through 3-4 rounds of CPR requiring Epi x3, amiodarone and Ca + intubation.  Renal ultrasound reassuring and urine microscopy was also okay but likely suffered ATN. -Urine output has slowly improved.  Creatinine improved today.  BUN still rising. - No absolute indication for RRT at this time, uremia difficult to delineate from anoxic brain injury/delirium.  Mental status the same today.  If he continues to be slow to improve or mental status worsens we may have to do dialysis empirically for uremia -Serum albumin is very low at 1.4, will provide IV albumin which can sometime help renal clearance when albumin is <2 - Avoid nephrotoxic agents as you are already doing and dose for GFR <20 - Hopefully his renal parameters will start to stabilize over the next few days.  2. HTN - controlled 3. DM 4. S/p cardiac arrest - not clear the etiology but he has a h/o HFrEF and CASHD.  Neurology following   Subjective:   More alert yesterday.  Mental status the same today.  Unable to perform simple tasks which is the same as yesterday per nursing staff.   Objective:   BP (!) 145/73   Pulse 73   Temp 99.2 F (37.3 C) (Oral)   Resp 20   Ht 5' 9"  (1.753 m)   Wt 96.2 kg   SpO2 97%   BMI 31.32 kg/m   Intake/Output Summary (Last 24 hours) at 06/05/2020 6269 Last data filed at 06/05/2020 0800 Gross per 24 hour  Intake 1670 ml  Output 1545 ml  Net 125 ml   Weight change: -0.2 kg  Physical Exam: General: NAD, intubated HEENT:  ETT, moist mucous membranes Lungs: Bilateral chest rise with no increased work of breathing Cardiovascular:RRR,  no JVD Abdomen: soft, non-distended Extremities: tr edema noted in left lower extremity, cast on right lower extremity GU: foley in place     Imaging: DG Ankle 2 Views Right  Result Date: 06/05/2020 CLINICAL DATA:  Right ankle fracture. EXAM: RIGHT ANKLE - 2 VIEW COMPARISON:  May 18, 2020. FINDINGS: Right ankle has been casted and immobilized. Minimally displaced distal right fibular fracture is noted. IMPRESSION: Casting and immobilization of distal right fibular fracture. Electronically Signed   By: Marijo Conception M.D.   On: 06/05/2020 09:02   MR BRAIN WO CONTRAST  Result Date: 06/04/2020 CLINICAL DATA:  Acute neuro deficit.  Suspect stroke. EXAM: MRI HEAD WITHOUT CONTRAST  TECHNIQUE: Multiplanar, multiecho pulse sequences of the brain and surrounding structures were obtained without intravenous contrast. COMPARISON:  MRI head 06/01/2020 FINDINGS: Brain: Small 4 mm area of acute infarct in the mid right frontal subcortical white matter. This is not seen on the prior diffusion-weighted imaging. Motion degraded study. Generalized atrophy. Minimal white matter changes. Negative for hemorrhage or mass. Vascular: Normal arterial flow voids Skull and upper cervical spine: Negative Sinuses/Orbits: Extensive mucosal edema throughout  the paranasal sinuses. Patient is intubated. Negative orbit Other: None IMPRESSION: 4 mm area of restricted diffusion in the mid right subcortical white matter compatible with acute infarct, not seen on the recent MRI Motion degraded study. Electronically Signed   By: Franchot Gallo M.D.   On: 06/04/2020 12:57   US RENAL  Result Date: 06/03/2020 CLINICAL DATA:  Acute on chronic renal failure EXAM: RENAL / URINARY TRACT ULTRASOUND COMPLETE COMPARISON:  11/28/2018 FINDINGS: Right Kidney: Renal measurements: 12.6 x 6.4 x 6.8 cm = volume: 229 mL. Normal echogenicity. No hydronephrosis. Small amount of perinephric edema. No renal lesion. Left Kidney: Renal measurements: 12.1 x 7.1 x 6.5 cm = volume: 293 mL. Normal echogenicity. No hydronephrosis. Small amount of perinephric edema. No renal lesion. Left kidney is slightly lobulated. Bladder: Bladder is decompressed with a Foley catheter. Foley balloon is visualized. Other: Small amount of perisplenic ascites. IMPRESSION: 1. Negative for hydronephrosis. 2. Small amount of perinephric edema bilaterally. 3. Minimal ascites in the left upper quadrant. Electronically Signed   By: Markus Daft M.D.   On: 06/03/2020 16:29   DG Chest Port 1 View  Result Date: 06/05/2020 CLINICAL DATA:  Respiratory failure EXAM: PORTABLE CHEST 1 VIEW COMPARISON:  06/03/2020 FINDINGS: Endotracheal tube seen 2.7 cm above the carina. Nasogastric tube extends into the upper abdomen. Right internal jugular temporary dialysis catheter tip noted within the superior vena cava. Bibasilar pulmonary infiltrates have progressed slightly in the interval, likely infectious or inflammatory. No pneumothorax. No pleural effusion. Mild cardiomegaly is stable. Epicardial defibrillator is unchanged. The pulmonary vascularity is normal. IMPRESSION: Stable support lines and tubes. Progressive bibasilar pulmonary infiltrate, likely infectious or inflammatory. Stable cardiomegaly. Electronically Signed   By: Fidela Salisbury MD   On: 06/05/2020 04:54   DG CHEST PORT 1 VIEW  Result Date: 06/03/2020 CLINICAL DATA:  Check line placement EXAM: PORTABLE CHEST 1 VIEW COMPARISON:  06/02/2020 FINDINGS: Cardiac shadow is enlarged but stable. Defibrillator is again noted anteriorly. Gastric catheter and endotracheal tube are noted in satisfactory position. New right jugular temporary dialysis catheter is seen with the catheter tip in the distal superior vena cava. No pneumothorax is seen at this time. Patchy airspace opacities are again identified similar to that seen on the prior exam. IMPRESSION: No pneumothorax following right jugular central line placement. No change from the previous exam. Electronically Signed   By: Inez Catalina M.D.   On: 06/03/2020 12:43   ECHOCARDIOGRAM LIMITED  Result Date: 06/04/2020    ECHOCARDIOGRAM LIMITED REPORT   Patient Name:   SELASSIE SPATAFORE Gerken Date of Exam: 06/04/2020 Medical Rec #:  [id number]       Height:       69.0 in Accession #:    [id number]      Weight:       212.5 lb Date of Birth:  11-29-1955        BSA:          2.120 m Patient Age:    76 years  BP:           140/75 mmHg Patient Gender: M               HR:           91 bpm. Exam Location:  Inpatient Procedure: Limited Echo, Limited Color Doppler and Color Doppler Indications:    acute respiratory distress  History:        Patient has prior history of Echocardiogram examinations, most                 recent 05/23/2020. Cardiac arrest, CAD, Defibrillator, chronic                 kidney disease, Signs/Symptoms:Altered Mental Status; Risk                 Factors:Current Smoker, Hypertension, Dyslipidemia and Diabetes.  Sonographer:    Johny Chess Referring Phys: [place]  Sonographer Comments: Echo performed with patient supine and on artificial respirator. IMPRESSIONS  1. Left ventricular ejection fraction, by estimation, is 30%. The left ventricle has moderate to severely decreased function. The left ventricle  demonstrates global hypokinesis. Left ventricular diastolic parameters are consistent with Grade II diastolic dysfunction (pseudonormalization).  2. Right ventricular systolic function is mildly reduced. The right ventricular size is normal. There is severely elevated pulmonary artery systolic pressure. The estimated right ventricular systolic pressure is 51.7 mmHg.  3. The mitral valve is normal in structure. No evidence of mitral valve regurgitation. No evidence of mitral stenosis.  4. The aortic valve is tricuspid. Aortic valve regurgitation is not visualized. Mild aortic valve sclerosis is present, with no evidence of aortic valve stenosis.  5. The inferior vena cava is dilated in size with <50% respiratory variability, suggesting right atrial pressure of 15 mmHg. FINDINGS  Left Ventricle: Left ventricular ejection fraction, by estimation, is 30%. The left ventricle has moderate to severely decreased function. The left ventricle demonstrates global hypokinesis. Left ventricular diastolic parameters are consistent with Grade II diastolic dysfunction (pseudonormalization). Right Ventricle: The right ventricular size is normal. No increase in right ventricular wall thickness. Right ventricular systolic function is mildly reduced. There is severely elevated pulmonary artery systolic pressure. The tricuspid regurgitant velocity is 4.07 m/s, and with an assumed right atrial pressure of 15 mmHg, the estimated right ventricular systolic pressure is 61.6 mmHg. Mitral Valve: The mitral valve is normal in structure. Mild mitral annular calcification. No evidence of mitral valve stenosis. Tricuspid Valve: The tricuspid valve is normal in structure. Tricuspid valve regurgitation is mild. Aortic Valve: The aortic valve is tricuspid. Aortic valve regurgitation is not visualized. Mild aortic valve sclerosis is present, with no evidence of aortic valve stenosis. Venous: The inferior vena cava is dilated in size with less than  50% respiratory variability, suggesting right atrial pressure of 15 mmHg.  LV Volumes (MOD) LV vol d, MOD A4C: 123.0 ml Diastology LV vol s, MOD A4C: 84.6 ml  LV e' medial:    7.29 cm/s LV SV MOD A4C:     123.0 ml LV E/e' medial:  18.0                             LV e' lateral:   9.36 cm/s                             LV E/e' lateral: 14.0  IVC IVC  diam: 2.30 cm AORTIC VALVE LVOT Vmax:   78.30 cm/s LVOT Vmean:  51.200 cm/s LVOT VTI:    0.154 m MITRAL VALVE                TRICUSPID VALVE MV Area (PHT): 4.31 cm     TR Peak grad:   66.3 mmHg MV Decel Time: 176 msec     TR Vmax:        407.00 cm/s MV E velocity: 131.00 cm/s MV A velocity: 83.90 cm/s   SHUNTS MV E/A ratio:  1.56         Systemic VTI: 0.15 m Loralie Champagne MD Electronically signed by Loralie Champagne MD Signature Date/Time: 06/04/2020/5:58:46 PM    Final     Labs: BMET Recent Labs  Lab 06/17/2020 7741 05/22/2020 4239 06/20/2020 5320 06/01/20 1024 06/02/20 0118 06/03/20 0054 06/03/20 0957 06/03/20 1038 06/04/20 0424 06/05/20 0322  NA 138   < > 137 137 137 139 140  --  141 143  K 4.2   < > 4.5 4.3 4.4 5.3* 5.1  --  4.6 4.4  CL 106  --  106 106 106 106 107  --  106 109  CO2 22  --  24 23 23 23 25   --  25 24  GLUCOSE 202*  --  194* 230* 220* 163* 255*  --  105* 134*  BUN 45*  --  46* 57* 72* 95* 100*  --  124* 138*  CREATININE 2.81*  --  2.96* 3.05* 3.19* 3.84* 3.70*  --  3.86* 3.74*  CALCIUM 9.2  --  8.9 8.5* 8.0* 8.1* 8.1*  --  8.0* 8.1*  PHOS 4.8*  --   --   --   --   --   --  5.8*  --  5.1*   < > = values in this interval not displayed.   CBC Recent Labs  Lab 06/18/2020 0333 05/29/2020 0409 06/02/20 0118 06/02/20 1702 06/03/20 0054 06/03/20 1706 06/04/20 0424 06/05/20 0322  WBC 15.1*   < > 9.9  --  11.5*  --  9.1 8.1  NEUTROABS 13.3*  --   --   --   --   --   --  5.9  HGB 10.4*   < > 8.0*   < > 7.8* 7.8* 7.2* 7.1*  HCT 32.2*   < > 25.0*   < > 24.7* 25.6* 23.5* 23.1*  MCV 82.1   < > 80.6  --  82.6  --  84.2 84.6  PLT 195   < >  136*  --  117*  --  140* 169   < > = values in this interval not displayed.    Medications:    . sodium chloride   Intravenous Once  . aspirin  81 mg Per Tube Daily  . budesonide (PULMICORT) nebulizer solution  0.5 mg Nebulization BID  . chlorhexidine gluconate (MEDLINE KIT)  15 mL Mouth Rinse BID  . Chlorhexidine Gluconate Cloth  6 each Topical Daily  . docusate  100 mg Per Tube BID  . enoxaparin (LOVENOX) injection  30 mg Subcutaneous Q24H  . feeding supplement (PROSource TF)  45 mL Per Tube TID  . feeding supplement (VITAL HIGH PROTEIN)  1,000 mL Per Tube Q24H  . insulin aspart  0-20 Units Subcutaneous Q4H  . insulin aspart  4 Units Subcutaneous Q4H  . insulin glargine  15 Units Subcutaneous BID  . ipratropium-albuterol  3 mL Nebulization Q6H  . mouth  rinse  15 mL Mouth Rinse 10 times per day  . pantoprazole (PROTONIX) IV  40 mg Intravenous QHS  . polyethylene glycol  17 g Per Tube Daily  . rosuvastatin  10 mg Per Tube Daily  . sodium chloride flush  10-40 mL Intracatheter Q12H      Reesa Chew  06/05/2020, 9:24 AM

## 2020-06-05 NOTE — Progress Notes (Addendum)
Advanced Heart Failure Rounding Note  PCP-Cardiologist: Rozann Lesches, MD  [facility]: Dr. Haroldine Laws  Subjective:    Remains intubated. Off sedation. Alert and grimaces to painful stimuli but not following commands.   Scr trending down, 3.86>>3.67 but BUN higher, 100>>124>>138. Nonoliguric. Normokalemic. Nephrology following. No HD yet.   No significant arrhthymias on tele.  Hs trop 801>>700>>551  Echo LVEF 20-25% (down from 30-35%), RV mod reduced    Objective:   Weight Range: 96.2 kg Body mass index is 31.32 kg/m.   Vital Signs:   Temp:  [98.9 F (37.2 C)-99.6 F (37.6 C)] 99.6 F (37.6 C) (04/15 1114) Pulse Rate:  [67-74] 73 (04/15 0800) Resp:  [13-20] 20 (04/15 0800) BP: (120-145)/(63-75) 145/73 (04/15 0800) SpO2:  [95 %-100 %] 97 % (04/15 0808) FiO2 (%):  [40 %] 40 % (04/15 0808) Weight:  [96.2 kg] 96.2 kg (04/15 0500) Last BM Date: 06/05/20  Weight change: Filed Weights   06/03/20 0427 06/04/20 0500 06/05/20 0500  Weight: 96.1 kg 96.4 kg 96.2 kg    Intake/Output:   Intake/Output Summary (Last 24 hours) at 06/05/2020 1132 Last data filed at 06/05/2020 0800 Gross per 24 hour  Intake 1485 ml  Output 1325 ml  Net 160 ml      Physical Exam    General:  Intubated, awake on vent. No resp difficulty HEENT: Normal Neck: Supple. JVP not well visualized . Carotids 2+ bilat; no bruits. No lymphadenopathy or thyromegaly appreciated. Cor: PMI nondisplaced. Regular rate & rhythm. No rubs, gallops or murmurs. Lungs: intubated and Mendoza  Abdomen: Soft, nontender, nondistended. No hepatosplenomegaly. No bruits or masses. Good bowel sounds. Extremities: No cyanosis, clubbing, rash, edema Neuro: intubated. Off sedation. Alert and grimaces to painful stimuli but not following commands.   Telemetry    NSR 95 bpm, brief run of SVT, brief runs of NSVT 3-4 beats   EKG    No new EKG to review   Labs    CBC Recent Labs    06/04/20 0424 06/05/20 0322  WBC 9.1  8.1  NEUTROABS  --  5.9  HGB 7.2* 7.1*  HCT 23.5* 23.1*  MCV 84.2 84.6  PLT 140* 646   Basic Metabolic Panel Recent Labs    06/03/20 1038 06/04/20 0424 06/05/20 0322  NA  --  141 143  K  --  4.6 4.4  CL  --  106 109  CO2  --  25 24  GLUCOSE  --  105* 134*  BUN  --  124* 138*  CREATININE  --  3.86* 3.74*  CALCIUM  --  8.0* 8.1*  MG  --   --  2.6*  PHOS 5.8*  --  5.1*   Liver Function Tests Recent Labs    06/04/20 0424 06/05/20 0322  AST 51* 62*  ALT 39 51*  ALKPHOS 158* 208*  BILITOT 0.6 0.5  PROT 4.8* 4.9*  ALBUMIN 1.4* 1.4*   No results for input(s): LIPASE, AMYLASE in the last 72 hours. Cardiac Enzymes Recent Labs    06/03/20 1825  CKTOTAL 175    BNP: BNP (last 3 results) Recent Labs    02/01/20 1848 02/06/20 0631 05/30/2020 0333  BNP 472.0* 194.0* 1,060.0*    ProBNP (last 3 results) No results for input(s): PROBNP in the last 8760 hours.   D-Dimer No results for input(s): DDIMER in the last 72 hours. Hemoglobin A1C No results for input(s): HGBA1C in the last 72 hours. Fasting Lipid Panel Recent Labs  06/05/20 0322  CHOL 104  HDL 22*  LDLCALC 63  TRIG 97  CHOLHDL 4.7   Thyroid Function Tests No results for input(s): TSH, T4TOTAL, T3FREE, THYROIDAB in the last 72 hours.  Invalid input(s): FREET3  Other results:   Imaging    DG Ankle 2 Views Right  Result Date: 06/05/2020 CLINICAL DATA:  Right ankle fracture. EXAM: RIGHT ANKLE - 2 VIEW COMPARISON:  May 18, 2020. FINDINGS: Right ankle has been casted and immobilized. Minimally displaced distal right fibular fracture is noted. IMPRESSION: Casting and immobilization of distal right fibular fracture. Electronically Signed   By: Marijo Conception M.D.   On: 06/05/2020 09:02   MR BRAIN WO CONTRAST  Result Date: 06/04/2020 CLINICAL DATA:  Acute neuro deficit.  Suspect stroke. EXAM: MRI HEAD WITHOUT CONTRAST TECHNIQUE: Multiplanar, multiecho pulse sequences of the brain and surrounding  structures were obtained without intravenous contrast. COMPARISON:  MRI head 06/01/2020 FINDINGS: Brain: Small 4 mm area of acute infarct in the mid right frontal subcortical white matter. This is not seen on the prior diffusion-weighted imaging. Motion degraded study. Generalized atrophy. Minimal white matter changes. Negative for hemorrhage or mass. Vascular: Normal arterial flow voids Skull and upper cervical spine: Negative Sinuses/Orbits: Extensive mucosal edema throughout the paranasal sinuses. Patient is intubated. Negative orbit Other: None IMPRESSION: 4 mm area of restricted diffusion in the mid right subcortical white matter compatible with acute infarct, not seen on the recent MRI Motion degraded study. Electronically Signed   By: Franchot Gallo M.D.   On: 06/04/2020 12:57   DG Chest Port 1 View  Result Date: 06/05/2020 CLINICAL DATA:  Respiratory failure EXAM: PORTABLE CHEST 1 VIEW COMPARISON:  06/03/2020 FINDINGS: Endotracheal tube seen 2.7 cm above the carina. Nasogastric tube extends into the upper abdomen. Right internal jugular temporary dialysis catheter tip noted within the superior vena cava. Bibasilar pulmonary infiltrates have progressed slightly in the interval, likely infectious or inflammatory. No pneumothorax. No pleural effusion. Mild cardiomegaly is stable. Epicardial defibrillator is unchanged. The pulmonary vascularity is normal. IMPRESSION: Stable support lines and tubes. Progressive bibasilar pulmonary infiltrate, likely infectious or inflammatory. Stable cardiomegaly. Electronically Signed   By: Fidela Salisbury MD   On: 06/05/2020 04:54   EEG adult  Result Date: 06/05/2020 Lora Havens, MD     06/05/2020  9:43 AM Patient Name: Brad Mendoza MRN: [id number] Epilepsy Attending: Lora Havens Referring Physician/Provider: Date: 06/04/2020 Duration: 29.51 mins Patient history: 65 yo M with persistent encephalopathy after admission with hyperosmolar state as well as cardiac  arrest. EEG to evaluate for seizure Level of alertness:  lethargic AEDs during EEG study: None Technical aspects: This EEG study was done with scalp electrodes positioned according to the 10-20 International system of electrode placement. Electrical activity was acquired at a sampling rate of 500Hz  and reviewed with a high frequency filter of 70Hz  and a low frequency filter of 1Hz . EEG data were recorded continuously and digitally stored. Description: EEG showed continuous generalized predominantly 2 to 3 Hz delta as well as 5 to 6 Hz theta slowing. Hyperventilation and photic stimulation were not performed.   ABNORMALITY - Continuous slow, generalized IMPRESSION: This study is suggestive of /severe diffuse encephalopathy, nonspecific etiology. No seizures or epileptiform discharges were seen throughout the recording. Lora Havens   ECHOCARDIOGRAM LIMITED  Result Date: 06/04/2020    ECHOCARDIOGRAM LIMITED REPORT   Patient Name:   Brad Mendoza Date of Exam: 06/04/2020 Medical Rec #:  [id number]  Height:       69.0 in Accession #:    [id number]      Weight:       212.5 lb Date of Birth:  Jan 13, 1956        BSA:          2.120 m Patient Age:    66 years        BP:           140/75 mmHg Patient Gender: M               HR:           91 bpm. Exam Location:  Inpatient Procedure: Limited Echo, Limited Color Doppler and Color Doppler Indications:    acute respiratory distress  History:        Patient has prior history of Echocardiogram examinations, most                 recent 05/22/2020. Cardiac arrest, CAD, Defibrillator, chronic                 kidney disease, Signs/Symptoms:Altered Mental Status; Risk                 Factors:Current Smoker, Hypertension, Dyslipidemia and Diabetes.  Sonographer:    Johny Chess Referring Phys: [place]  Sonographer Comments: Echo performed with patient supine and on artificial respirator. IMPRESSIONS  1. Left ventricular ejection fraction, by estimation, is  30%. The left ventricle has moderate to severely decreased function. The left ventricle demonstrates global hypokinesis. Left ventricular diastolic parameters are consistent with Grade II diastolic dysfunction (pseudonormalization).  2. Right ventricular systolic function is mildly reduced. The right ventricular size is normal. There is severely elevated pulmonary artery systolic pressure. The estimated right ventricular systolic pressure is 02.5 mmHg.  3. The mitral valve is normal in structure. No evidence of mitral valve regurgitation. No evidence of mitral stenosis.  4. The aortic valve is tricuspid. Aortic valve regurgitation is not visualized. Mild aortic valve sclerosis is present, with no evidence of aortic valve stenosis.  5. The inferior vena cava is dilated in size with <50% respiratory variability, suggesting right atrial pressure of 15 mmHg. FINDINGS  Left Ventricle: Left ventricular ejection fraction, by estimation, is 30%. The left ventricle has moderate to severely decreased function. The left ventricle demonstrates global hypokinesis. Left ventricular diastolic parameters are consistent with Grade II diastolic dysfunction (pseudonormalization). Right Ventricle: The right ventricular size is normal. No increase in right ventricular wall thickness. Right ventricular systolic function is mildly reduced. There is severely elevated pulmonary artery systolic pressure. The tricuspid regurgitant velocity is 4.07 m/s, and with an assumed right atrial pressure of 15 mmHg, the estimated right ventricular systolic pressure is 85.2 mmHg. Mitral Valve: The mitral valve is normal in structure. Mild mitral annular calcification. No evidence of mitral valve stenosis. Tricuspid Valve: The tricuspid valve is normal in structure. Tricuspid valve regurgitation is mild. Aortic Valve: The aortic valve is tricuspid. Aortic valve regurgitation is not visualized. Mild aortic valve sclerosis is present, with no evidence of  aortic valve stenosis. Venous: The inferior vena cava is dilated in size with less than 50% respiratory variability, suggesting right atrial pressure of 15 mmHg.  LV Volumes (MOD) LV vol d, MOD A4C: 123.0 ml Diastology LV vol s, MOD A4C: 84.6 ml  LV e' medial:    7.29 cm/s LV SV MOD A4C:     123.0 ml LV E/e' medial:  18.0  LV e' lateral:   9.36 cm/s                             LV E/e' lateral: 14.0  IVC IVC diam: 2.30 cm AORTIC VALVE LVOT Vmax:   78.30 cm/s LVOT Vmean:  51.200 cm/s LVOT VTI:    0.154 m MITRAL VALVE                TRICUSPID VALVE MV Area (PHT): 4.31 cm     TR Peak grad:   66.3 mmHg MV Decel Time: 176 msec     TR Vmax:        407.00 cm/s MV E velocity: 131.00 cm/s MV A velocity: 83.90 cm/s   SHUNTS MV E/A ratio:  1.56         Systemic VTI: 0.15 m Loralie Champagne MD Electronically signed by Loralie Champagne MD Signature Date/Time: 06/04/2020/5:58:46 PM    Final       Medications:     Scheduled Medications: . sodium chloride   Intravenous Once  . aspirin  81 mg Per Tube Daily  . budesonide (PULMICORT) nebulizer solution  0.5 mg Nebulization BID  . chlorhexidine gluconate (MEDLINE KIT)  15 mL Mouth Rinse BID  . Chlorhexidine Gluconate Cloth  6 each Topical Daily  . docusate  100 mg Per Tube BID  . enoxaparin (LOVENOX) injection  30 mg Subcutaneous Q24H  . feeding supplement (PROSource TF)  45 mL Per Tube TID  . feeding supplement (VITAL HIGH PROTEIN)  1,000 mL Per Tube Q24H  . insulin aspart  0-20 Units Subcutaneous Q4H  . insulin aspart  4 Units Subcutaneous Q4H  . insulin glargine  15 Units Subcutaneous BID  . ipratropium-albuterol  3 mL Nebulization Q6H  . mouth rinse  15 mL Mouth Rinse 10 times per day  . pantoprazole (PROTONIX) IV  40 mg Intravenous QHS  . polyethylene glycol  17 g Per Tube Daily  . rosuvastatin  10 mg Per Tube Daily  . sodium chloride flush  10-40 mL Intracatheter Q12H     Infusions: . albumin human 25 g (06/05/20 0734)  .  cefTRIAXone (ROCEPHIN)  IV 2 g (06/05/20 1052)  . dexmedetomidine (PRECEDEX) IV infusion Stopped (06/05/20 0711)  . 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 Admitted with seizure and cardiac arrest.  Scr peaked to 3.9. Echo EF 20-25% (down from 30-35%), RV mod reduced hstrop 800 -> 551. Initial   Co-ox 73%  Assessment/Plan   1. Cardiac Arrest - Suspect major issues are non-cardiac despite drop in EF from 30-35% -> 20-25%. Initial co-ox ok   hs trop 801>>700>>551. Suspect mostly demand ischemia and not ACS. - no plans for cath currently given AKI  - he has an ICD. Will ask device reps to interrogate  - ? If cause by seizure   2. Acute Hypoxemic Respiratory Failure - Remains intubated.  - vent management per PCCM   3. AKI - Creatinine baseline 2.8-3.2  - peaked to 3.9 this admit, post arrest  - 3.7 today. BUN 138  - Nephrology following. No RRT yet per nephrology   4. Acute Encephalopathy  - Possible metabolic abnormalities.  -  MRI /MRA  4/11 no acute findings.-->4/14 MRI with small acute infact mid right frontal subcortical.  - Neurology following  - EEG suggestive of /severe diffuse encephalopathy, nonspecific etiology. No seizures or epileptiform discharges   5. Seizure - Neuro following - suggestive of /severe diffuse encephalopathy, nonspecific etiology. No seizures or epileptiform discharges were seen throughout the recording - MRI with small acute infact mid right frontal subcortical.  - management per neuro    6. IDDM  - Uncontrolled  - Hgb A1C 15.  - insulin ordered per primary team   7. Chronic HFrEF  - Mixed NICM/ICM . Echo this admit EF 20-25%,  down from previous 30-35% in 01/2020.  - will order CVP monitoring. Will follow co-ox daily  - if CVP elevated, will need nephrology assistance regarding initiation of diuretics vs RRT given AKI    8. CAD - S/p DES to proximal RCA in 1/20. - hs trop 801>>700>>551, suspect demand ischemia - no cath given AKI     Length of Stay: [address] PA-C  06/05/2020, 11:32 AM  Advanced Heart Failure Team Pager [phone] (M-F; 7a - 5p)  Please contact [facility] for night-coverage after hours (5p -7a ) and weekends on [email or web address]  Agree with above.   Remains intubated. Awake but not following commands. Echo with EF 25%. Co-ox 73% on 4/13. Not checked since. BUN/Cr remain elevated. Creatinine slightly improved today. BUN up to 138. CVP 14. Renal following/ Making some urine   General:  Awake on vent but not following commands HEENT: normal +ETT Neck: supple. +RIJ HD cath. Carotids 2+ bilat; no bruits. No lymphadenopathy or thryomegaly appreciated. Cor: PMI nondisplaced. Regular rate & rhythm. No rubs, gallops or murmurs. Lungs: Mendoza Abdomen: soft, nontender, nondistended. No hepatosplenomegaly. No bruits or masses. Good bowel sounds. Extremities: no cyanosis, clubbing, rash, 1+ edema on LLE. Cast on RLE Neuro: awake on vent but not following commands  Very difficult situation. Continues with delirium. Unclear how much of this is uremia versus anoxic/related to seizure. Cardiac output is preserved despite low EF. Volume status is elevated. Would continue attempts at diuresis per Renal. Would have low threshold to consider CVVHD if BUN not correcting soon. Avoid hypotension.   We will see again on Monday. Please call over the w/e if we can help.   CRITICAL CARE Performed by: Glori Bickers  Total critical care time: 35 minutes  Critical care time was exclusive of separately billable procedures and treating other patients.  Critical care was necessary to treat or prevent  imminent or life-threatening deterioration.  Critical care was time spent personally by me (independent of midlevel providers or residents) on the following activities: development of treatment plan with patient and/or surrogate as well as nursing, discussions with consultants, evaluation of patient's response to treatment, examination of patient, obtaining history from patient or surrogate, ordering and performing treatments and interventions, ordering and review of laboratory studies, ordering and review of radiographic studies, pulse oximetry and re-evaluation of patient's condition.  Glori Bickers, MD  5:32 PM

## 2020-06-05 NOTE — Progress Notes (Signed)
RT note. RT suctioned nasotracheal. Pt. Did desat to 38 and a brief moment of vtach. Moderate amount of secretions white/clear then bloody, RN made aware, RT will continue to monitor, VSS

## 2020-06-05 NOTE — Procedures (Signed)
Patient Name: Brad Mendoza  MRN: [id number]  Epilepsy Attending: Lora Havens  Referring Physician/Provider:  Date: 06/04/2020  Duration: 29.51 mins  Patient history: 65 yo M with persistent encephalopathy after admission with hyperosmolar state as well as cardiac arrest. EEG to evaluate for seizure  Level of alertness:  lethargic   AEDs during EEG study: None  Technical aspects: This EEG study was done with scalp electrodes positioned according to the 10-20 International system of electrode placement. Electrical activity was acquired at a sampling rate of 500Hz  and reviewed with a high frequency filter of 70Hz  and a low frequency filter of 1Hz . EEG data were recorded continuously and digitally stored.   Description: EEG showed continuous generalized predominantly 2 to 3 Hz delta as well as 5 to 6 Hz theta slowing. Hyperventilation and photic stimulation were not performed.     ABNORMALITY - Continuous slow, generalized  IMPRESSION: This study is suggestive of /severe diffuse encephalopathy, nonspecific etiology. No seizures or epileptiform discharges were seen throughout the recording.  Lambros Cerro [name]

## 2020-06-05 NOTE — Progress Notes (Addendum)
NAME:  Brad Mendoza, MRN:  [id number], DOB:  April 01, 1955, LOS: 5 ADMISSION DATE:  06/11/2020, CONSULTATION DATE:  4/10  REFERRING MD:  Dr. Otelia Limes, CHIEF COMPLAINT:  Cardiac Arrest    History of Present Illness:  Patient is intubated and sedated; therefore history has been obtained from chart review.  Brad Mendoza is a 65 y.o. 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 Patient presented to the [facility] ED on 4/9 with complaints of seizure-like activity and decreased level of consciousness: last normal 1630 with family finding him w/ slurred speech at 1700. As the patient was being transferred into the ambulance he developed agonal respirations and suffered a generalized tonic-clonic seizure. Seizure aborted w/ Versed but patient did wake up from his postictal phase and was bagged in route to the ED. Upon arrival to the ED he was found to be pulseless and underwent CPR. ROSC after 3-4 rounds, non-shockable rhythm - Epi x3, amiodarone, calcium. Pt had copies projectile emesis after ROSC; agonal respiration; OG dropped before, suctioned, then uncomplicated intubation Non-sustained VT afterwards, followed by sinus tach. Initial BPs with systolic in the 741U. Head CT at OSH showed no acute processes. Telemetry neurology was involved and recommended a CTA; however, was not preformed 2/2 CKD/elevated serum creatinine. [facility] did not have MRI capabilities and the patient was referred for transfer to [facility]. MRI on 4/11 did not indicate acute ischemic or hemorrhagic process and was not c/w anoxic brain injury.    Pertinent  Medical History  CAD s/p DES RCA, HFrEF s/p ICD CKD IV HTN, HLD, T2DM GERD Chronic back pain Fracture right lower extremity occurred on 04/17/2020  Significant Hospital Events: Including  procedures, antibiotic start and stop dates in addition to other pertinent events    4/9 Sz, cardiac arrest  4/10 tx to [facility] care  4/10 nonspecific EEG  4/11 largely unremarkable MRI/MRA 06/03/2020 right internal jugular hemodialysis catheter placed without difficulty>> 06/04/2020 4 mm area of restricted diffusion in the mid right subcortical white matter compatible with acute infarct, not seen on the recent MRI  Interim History / Subjective:  Sedation has been decreased.  The patient is more interactive.  Does not follow commands.  Moves upper extremities spontaneously.  Neurology is following MRI shows new 4 mm area of redistribution diffusion in the right subcortical white matter compatible with acute infarct.  Suspect he is having a tracheostomy for long-term care  Objective   Blood pressure 139/75, pulse 69, temperature 99.2 F (37.3 C), temperature source Oral, resp. rate 14, height 5\' 9"  (1.753 m), weight 96.2 kg, SpO2 97 %.    Vent Mode: PRVC FiO2 (%):  [40 %] 40 % Set Rate:  [12 bmp] 12 bmp Vt Set:  [560 mL] 560 mL PEEP:  [5 cmH20] 5 cmH20 Plateau Pressure:  [15 cmH20-16 cmH20] 16 cmH20   Intake/Output Summary (Last 24 hours) at 06/05/2020 0757 Last data filed at 06/05/2020 0700 Gross per 24 hour  Intake 1726.19 ml  Output 1385 ml  Net 341.19 ml   Filed Weights   06/03/20 0427 06/04/20 0500 06/05/20 0500  Weight: 96.1 kg 96.4 kg 96.2 kg    Examination: General: 65 year old male who is less obtunded today  HEENT: Endotracheal tube gastric tube are in place, no JVD is appreciated.  Pupils  are equal reactive to light Neuro: Does not track or follow, grimaces to noxious stimuli CV: Heart sounds are regular PULM: Diminished in bilateral bases Vent pressure regulated volume control FIO2 40% PEEP 5 RATE 12 plus 4 VT 560  GI: soft, bsx4 active tube feedings at goal GU: Amber urine Extremities: warm/dry,  edema right short leg cast in place good  capillary refill in her right toes Skin: no rashes or lesions    Labs/imaging that I havepersonally reviewed  (right click and "Reselect all SmartList Selections" daily)  CBC BMP MRI [facility] Problem list   N/A  Assessment & Plan:   Brad Mendoza is a 65 y.o. male with a relevant PMHx of CAD s/p RCA DES (02/2018), HFrEF s/p ICD (EF~20-30%), CKD IV, T2DM c/b poor glycemic control (12/21 A1c: 8.1 > 06/04/2020 15.4), and HTN a/f seizure-like activity, AMS followed by cardiac arrest. Patient presentation c/f CVA or anoxic encephalopathy but head CT and MRI/MRA demonstrated no acute processes or diffuse injuries. Pt was found to have a BG in the 800s and an A1c of 15.4, which makes a provoked seizure 2/2 HHS a likely etiology given non-specific EEG and unremarkable imaging results. Patient remains critically ill with unknown neurologic status. Will need to attempt extubation and establish neurologic function.  Currently 06/04/2020 mental status precludes extubation.  Continue to wean as tolerated and reassess mental status.  Cardiac Arrest  Known history of coronary disease and chronic HFrEF increases his risk for sudden cardiac arrest. Cardiac arrest likely 2/2 to seizure (per above). Will need to assess neurologic function following extubation.   Continue to monitor intensive care unit  Acute hypoxemic respiratory failure Continue to wean per protocol His mental status precludes extubation Hold sedation as able He may need an early tracheostomy  Right lower extremity fracture with cast in place. Closed reduction 04/17/20. Dr. Arther Abbott orthopedics last examined 04/22/20 [facility] in [place].  Orthopedic consult 06/05/2020 called with [facility]. PA to come see pt 4/15 Image right lower extremity for completeness     C/f Aspir Desaiatoin PNA vs Pneumonitis Following ROSC patient had emesis c/f aspiration. Steadily increasing fever that started overnight  in setting of net downtrending WBC (14.7 > 10 > 9.9 > 11.5).  Less likely to be VAP given timeframe.   Repeat sputum culture 06/05/2020 with worsening by basilar airspace disease noted on chest x-ray. Continue current antimicrobial therapy until completion    AKI on CKD IV Lab Results  Component Value Date   CREATININE 3.74 (H) 06/05/2020   CREATININE 3.86 (H) 06/04/2020   CREATININE 3.70 (H) 06/03/2020   CREATININE 1.04 07/11/2012    He has a hemodialysis catheter placed on 06/03/2020 May need dialysis in the future Questionable if renal function is delaying clearing of narcotics have asked nurses to minimize all sedation Appreciate nephrology's input     Micro-normocytic Anemia  Thrombocytopenia  Decreased Hemoglobin  Recent Labs    06/04/20 0424 06/05/20 0322  HGB 7.2* 7.1*    Transfuse per protocol. No overt source of bleeding May need to hold anticoagulation in near future   Acute encephalopathy Evidence of slurred speech and AMS prior to arrest. Given MRI/MRA most likely dx is metabolic derangement. Have been unable to extubate patient or get significant clinical response (but has remained sedated).    Improved See below   Seizure preceded by acute encephalopathy  Pre-cardiac arrest. Likely provoked seizure 2/2 uncontrolled hyperglycemia. UA negative for ketones. No  significant PTA ETOH use. UDS +TCH. No seizure-contributing meds, but his children are unsure what pain med he has been taking for his leg (concern for possible Ultram use).   Neurology is following appreciate their input 4 mm area of restricted diffusion in the mid right subcortical white matter compatible with acute infarct, not seen on the recent MRI was noted on MRI for 14 2022 Hold sedation as able No seizure activity is appreciated More awake but does not follow commands.     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 Likely only short-term use of opiates from recent foot fracture.   Monitor for signs and symptoms of withdrawal   Best practice (right click and "Reselect all SmartList Selections" daily)  Diet:  Tube Feed  Pain/Anxiety/Delirium protocol (if indicated): Yes (RASS goal 0) VAP protocol (if indicated): Yes DVT prophylaxis: LMWH GI prophylaxis: PPI Glucose control:  SSI Yes and Basal insulin Yes Central venous access:  Yes, and it is still needed Arterial line:  N/A Foley:  Yes, and it is still needed Mobility:  bed rest  PT consulted: N/A Last date of multidisciplinary goals of care discussion [TBD] Code Status:  full code Disposition: ICU   Labs   CBC: Recent Labs  Lab 06/15/2020 0333 06/11/2020 0409 06/01/20 1024 06/02/20 0118 06/02/20 1702 06/03/20 0054 06/03/20 1706 06/04/20 0424 06/05/20 0322  WBC 15.1*   < > 10.0 9.9  --  11.5*  --  9.1 8.1  NEUTROABS 13.3*  --   --   --   --   --   --   --  5.9  HGB 10.4*   < > 9.5* 8.0* 7.9* 7.8* 7.8* 7.2* 7.1*  HCT 32.2*   < > 29.8* 25.0* 25.1* 24.7* 25.6* 23.5* 23.1*  MCV 82.1   < > 81.6 80.6  --  82.6  --  84.2 84.6  PLT 195   < > 154 136*  --  117*  --  140* 169   < > = values in this interval not displayed.    Basic Metabolic Panel: Recent Labs  Lab 06/07/2020 0333 06/16/2020 0409 06/08/2020 0758 06/01/20 1024 06/02/20 0118 06/03/20 0054 06/03/20 0957 06/03/20 1038 06/04/20 0424 06/05/20 0322  NA 138   < >  137   < > 137 139 140  --  141 143  K 4.2   < > 4.5   < > 4.4 5.3* 5.1  --  4.6 4.4  CL 106  --  106   < > 106 106 107  --  106 109  CO2 22  --  24   < > 23 23 25   --  25 24  GLUCOSE 202*  --  194*   < > 220* 163* 255*  --  105* 134*  BUN 45*  --  46*   < > 72* 95*  100*  --  124* 138*  CREATININE 2.81*  --  2.96*   < > 3.19* 3.84* 3.70*  --  3.86* 3.74*  CALCIUM 9.2  --  8.9   < > 8.0* 8.1* 8.1*  --  8.0* 8.1*  MG 2.1  --  2.1  --   --   --   --   --   --  2.6*  PHOS 4.8*  --   --   --   --   --   --  5.8*  --  5.1*   < > = values in this interval not displayed.   GFR: Estimated Creatinine Clearance: 22.8 mL/min (A) (by C-G formula based on SCr of 3.74 mg/dL (H)). Recent Labs  Lab 06/01/2020 0333 05/26/2020 0758 05/27/2020 1300 05/28/2020 1520 06/01/20 1024 06/02/20 0118 06/03/20 0054 06/04/20 0424 06/05/20 0322  PROCALCITON 3.38  --   --   --   --   --   --   --   --   WBC 15.1* 14.7*  --   --    < > 9.9 11.5* 9.1 8.1  LATICACIDVEN 3.1* 2.8* 2.2* 2.3*  --   --   --   --   --    < > = values in this interval not displayed.    Liver Function Tests: Recent Labs  Lab 05/22/2020 0333 06/02/20 1702 06/03/20 0054 06/04/20 0424 06/05/20 0322  AST 122* 20 22 51* 62*  ALT 107* 39 37 39 51*  ALKPHOS 118 118 118 158* 208*  BILITOT 0.7 0.8 0.7 0.6 0.5  PROT 5.2* 4.6* 5.0* 4.8* 4.9*  ALBUMIN 2.2* 1.6* 1.6* 1.4* 1.4*   Recent Labs  Lab 05/24/2020 0333  LIPASE 28  AMYLASE 152*   No results for input(s): AMMONIA in the last 168 hours.  ABG    Component Value Date/Time   PHART 7.347 (L) 06/06/2020 0409   PCO2ART 43.9 05/27/2020 0409   PO2ART 113 (H) 06/15/2020 0409   HCO3 24.1 06/14/2020 0409   TCO2 25 06/19/2020 0409   ACIDBASEDEF 2.0 05/23/2020 0409   O2SAT 72.6 06/03/2020 1415     Coagulation Profile: Recent Labs  Lab 06/03/20 1038  INR 1.2    Cardiac Enzymes: Recent Labs  Lab 06/03/20 1825  CKTOTAL 175    HbA1C: HB A1C (BAYER DCA - WAIVED)  Date/Time Value Ref  Range Status  12/09/2019 01:10 PM 7.8 (H) <7.0 % Final    Comment:                                          Diabetic Adult            <7.0                                       Healthy Adult        4.3 - 5.7                                                           (DCCT/NGSP) American Diabetes Association's Summary of Glycemic Recommendations for Adults with Diabetes: Hemoglobin  A1c <7.0%. More stringent glycemic goals (A1c <6.0%) may further reduce complications at the cost of increased risk of hypoglycemia.   09/03/2019 12:51 PM >14.0 (H) <7.0 % Final    Comment:                                          Diabetic Adult            <7.0                                       Healthy Adult        4.3 - 5.7                                                           (DCCT/NGSP) American Diabetes Association's Summary of Glycemic Recommendations for Adults with Diabetes: Hemoglobin A1c <7.0%. More stringent glycemic goals (A1c <6.0%) may further reduce complications at the cost of increased risk of hypoglycemia.    Hgb A1c MFr Bld  Date/Time Value Ref Range Status  06/05/2020 03:33 AM 15.4 (H) 4.8 - 5.6 % Final    Comment:    (NOTE)         Prediabetes: 5.7 - 6.4         Diabetes: >6.4         Glycemic control for adults with diabetes: <7.0   02/01/2020 06:48 PM 8.1 (H) 4.8 - 5.6 % Final    Comment:    (NOTE) Pre diabetes:          5.7%-6.4%  Diabetes:              >6.4%  Glycemic control for   <7.0% adults with diabetes     CBG: Recent Labs  Lab 06/04/20 1533 06/04/20 1917 06/04/20 2322 06/05/20 0319 06/05/20 0713  GLUCAP 191* 142* 138* 126* 141*      App cct 34 min   Richardson Landry Anica Alcaraz ACNP Acute Care Nurse Practitioner [place] Please consult Amion 06/05/2020, 7:57 AM

## 2020-06-05 NOTE — Progress Notes (Signed)
[facility] Progress Note Patient Name: CLAYDEN WITHEM DOB: February 08, 1956 MRN: [id number]   Date of Service  06/05/2020  HPI/Events of Note  Patient with low blood sugar and elevated blood pressure. He is NPO.  eICU Interventions  PRN iv Hydralazine ordered, D 5 % gtt @ 50 ml / hour ordered x 12 hours.        [name] Lennie Vasco 06/05/2020, 8:40 PM

## 2020-06-05 NOTE — Progress Notes (Signed)
[facility] Progress Note Patient Name: LAQUINCY EASTRIDGE DOB: Dec 12, 1955 MRN: [id number]   Date of Service  06/05/2020  HPI/Events of Note  Patient with copious oral secretions that he is having trouble dealing with.  eICU Interventions  Glycopyrrolate 0.2 mg IM x 1 ordered for secretions.        Frederik Pear 06/05/2020, 11:36 PM

## 2020-06-05 NOTE — Progress Notes (Signed)
Pt extubated to 4L nasal cannula per MD order. RN was at bedside. RT questioned extubation before hand due to the patient not being able to follow simple commands, ie squeeze my hand, pick up your head. MD came to bedside after extubation. MD asked patient to state his name, and the patient would not do so. Pt has rhonchi throughout his lunges. I am concerned about him being able to protect his airway and control his secretions.

## 2020-06-05 NOTE — Progress Notes (Signed)
Subjective: Alertness continues to improve, but continues to be encephalopathic.  Exam: Vitals:   06/05/20 0808 06/05/20 1114  BP:    Pulse:    Resp:    Temp:  99.6 F (37.6 C)  SpO2: 97%    Gen: In bed, NAD Resp: non-labored breathing, no acute distress Abd: soft, nt  Neuro: MS: Eyes open, does not fixate or track does not follow commands.  UX:YBFXO, blinks to eyelid stimulation bilaterally.  Does not blink to threat Motor: withdraws to noxious stimulation in all four extremities Sensory:as above  Pertinent Labs: LDL 63 EF is low at 30% BUN 138  Impression: 65 yo M with persistent encephalopathy after admission with hyperosmolar state as well as cardiac arrest. His increasing BUN is certainly going to be a confounding factor in his encephalopathy, but with his improvement today and unimpressive imaging, I think that he has a decent prognosis at this point from a neuro perspective. The tiny stroke is incidental and likely represents decompensated small vessel disease.  Though his low EF could be potentially a source of embolus, with it not being clearly an embolic stroke and the EF is not as low as would typically merited, I do not think I would start anticoagulation from a stroke prevention standpoint.  I am not certain at this point how much of his encephalopathy is due to his renal failure versus mild hypoxic injury.  Even if it is hypoxic injury, with his improvement seen thus far, I would favor continued aggressive care as he has a likely good chance of recovery even if he does not make a lot of progress in the short-term.  Recommendations: 1) LDL is okay 2) continue ASA 3) will continue to follow.   Roland Rack, MD [facility] [phone]  If 7pm- 7am, please page neurology on call as listed in [place].

## 2020-06-06 ENCOUNTER — Inpatient Hospital Stay (HOSPITAL_COMMUNITY): Payer: Medicare Other

## 2020-06-06 DIAGNOSIS — N179 Acute kidney failure, unspecified: Secondary | ICD-10-CM | POA: Diagnosis not present

## 2020-06-06 DIAGNOSIS — Z794 Long term (current) use of insulin: Secondary | ICD-10-CM

## 2020-06-06 DIAGNOSIS — R569 Unspecified convulsions: Secondary | ICD-10-CM | POA: Diagnosis not present

## 2020-06-06 DIAGNOSIS — J9601 Acute respiratory failure with hypoxia: Secondary | ICD-10-CM | POA: Diagnosis not present

## 2020-06-06 DIAGNOSIS — N189 Chronic kidney disease, unspecified: Secondary | ICD-10-CM

## 2020-06-06 DIAGNOSIS — E1142 Type 2 diabetes mellitus with diabetic polyneuropathy: Secondary | ICD-10-CM | POA: Diagnosis not present

## 2020-06-06 DIAGNOSIS — G931 Anoxic brain damage, not elsewhere classified: Secondary | ICD-10-CM | POA: Diagnosis not present

## 2020-06-06 LAB — CBC WITH DIFFERENTIAL/PLATELET
Abs Immature Granulocytes: 0.16 10*3/uL — ABNORMAL HIGH (ref 0.00–0.07)
Basophils Absolute: 0.1 10*3/uL (ref 0.0–0.1)
Basophils Relative: 0 %
Eosinophils Absolute: 0 10*3/uL (ref 0.0–0.5)
Eosinophils Relative: 0 %
HCT: 25.4 % — ABNORMAL LOW (ref 39.0–52.0)
Hemoglobin: 7.9 g/dL — ABNORMAL LOW (ref 13.0–17.0)
Immature Granulocytes: 1 %
Lymphocytes Relative: 4 %
Lymphs Abs: 0.5 10*3/uL — ABNORMAL LOW (ref 0.7–4.0)
MCH: 25.7 pg — ABNORMAL LOW (ref 26.0–34.0)
MCHC: 31.1 g/dL (ref 30.0–36.0)
MCV: 82.7 fL (ref 80.0–100.0)
Monocytes Absolute: 1.1 10*3/uL — ABNORMAL HIGH (ref 0.1–1.0)
Monocytes Relative: 8 %
Neutro Abs: 11.6 10*3/uL — ABNORMAL HIGH (ref 1.7–7.7)
Neutrophils Relative %: 87 %
Platelets: 255 10*3/uL (ref 150–400)
RBC: 3.07 MIL/uL — ABNORMAL LOW (ref 4.22–5.81)
RDW: 15.9 % — ABNORMAL HIGH (ref 11.5–15.5)
WBC: 13.4 10*3/uL — ABNORMAL HIGH (ref 4.0–10.5)
nRBC: 0.1 % (ref 0.0–0.2)

## 2020-06-06 LAB — GLUCOSE, CAPILLARY
Glucose-Capillary: 114 mg/dL — ABNORMAL HIGH (ref 70–99)
Glucose-Capillary: 149 mg/dL — ABNORMAL HIGH (ref 70–99)
Glucose-Capillary: 157 mg/dL — ABNORMAL HIGH (ref 70–99)
Glucose-Capillary: 185 mg/dL — ABNORMAL HIGH (ref 70–99)
Glucose-Capillary: 207 mg/dL — ABNORMAL HIGH (ref 70–99)
Glucose-Capillary: 221 mg/dL — ABNORMAL HIGH (ref 70–99)

## 2020-06-06 LAB — COMPREHENSIVE METABOLIC PANEL
ALT: 50 U/L — ABNORMAL HIGH (ref 0–44)
AST: 53 U/L — ABNORMAL HIGH (ref 15–41)
Albumin: 2.1 g/dL — ABNORMAL LOW (ref 3.5–5.0)
Alkaline Phosphatase: 254 U/L — ABNORMAL HIGH (ref 38–126)
Anion gap: 14 (ref 5–15)
BUN: 128 mg/dL — ABNORMAL HIGH (ref 8–23)
CO2: 22 mmol/L (ref 22–32)
Calcium: 8.7 mg/dL — ABNORMAL LOW (ref 8.9–10.3)
Chloride: 112 mmol/L — ABNORMAL HIGH (ref 98–111)
Creatinine, Ser: 3.44 mg/dL — ABNORMAL HIGH (ref 0.61–1.24)
GFR, Estimated: 19 mL/min — ABNORMAL LOW (ref >60)
Glucose, Bld: 94 mg/dL (ref 70–99)
Potassium: 4 mmol/L (ref 3.5–5.1)
Sodium: 148 mmol/L — ABNORMAL HIGH (ref 135–145)
Total Bilirubin: 0.5 mg/dL (ref 0.3–1.2)
Total Protein: 5.9 g/dL — ABNORMAL LOW (ref 6.5–8.1)

## 2020-06-06 LAB — PHOSPHORUS: Phosphorus: 3.8 mg/dL (ref 2.5–4.6)

## 2020-06-06 LAB — MAGNESIUM: Magnesium: 2.7 mg/dL — ABNORMAL HIGH (ref 1.7–2.4)

## 2020-06-06 MED ORDER — METOPROLOL TARTRATE 25 MG PO TABS
50.0000 mg | ORAL_TABLET | Freq: Three times a day (TID) | ORAL | Status: DC
  Administered 2020-06-06 – 2020-06-12 (×18): 50 mg
  Filled 2020-06-06 (×10): qty 2
  Filled 2020-06-06: qty 1
  Filled 2020-06-06 (×8): qty 2

## 2020-06-06 MED ORDER — PANTOPRAZOLE SODIUM 40 MG PO PACK
40.0000 mg | PACK | Freq: Every day | ORAL | Status: DC
  Administered 2020-06-06 – 2020-06-11 (×6): 40 mg
  Filled 2020-06-06 (×7): qty 20

## 2020-06-06 MED ORDER — CHLORHEXIDINE GLUCONATE 0.12 % MT SOLN
OROMUCOSAL | Status: AC
  Filled 2020-06-06: qty 15

## 2020-06-06 MED ORDER — DILTIAZEM LOAD VIA INFUSION
10.0000 mg | Freq: Once | INTRAVENOUS | Status: AC
  Administered 2020-06-06: 10 mg via INTRAVENOUS
  Filled 2020-06-06: qty 10

## 2020-06-06 MED ORDER — DILTIAZEM HCL-DEXTROSE 125-5 MG/125ML-% IV SOLN (PREMIX)
5.0000 mg/h | INTRAVENOUS | Status: DC
  Administered 2020-06-06: 5 mg/h via INTRAVENOUS
  Filled 2020-06-06: qty 125

## 2020-06-06 MED ORDER — AMIODARONE IV BOLUS ONLY 150 MG/100ML
150.0000 mg | Freq: Once | INTRAVENOUS | Status: AC
  Administered 2020-06-06: 150 mg via INTRAVENOUS
  Filled 2020-06-06: qty 100

## 2020-06-06 NOTE — Progress Notes (Signed)
eLink Physician-Brief Progress Note Patient Name: Brad Mendoza DOB: 1955-11-13 MRN: [id number]   Date of Service  06/06/2020  HPI/Events of Note  Patient's CXR and ABG are quite reassuring, he's in atrial fibrillation with RVR but he's hemodynamically stable, BIPAP was discontinued and his sats are in the low 90's.  eICU Interventions  Amiodarone 150  mg  Iv x 1.        [name] [name] 06/06/2020, 1:48 AM

## 2020-06-06 NOTE — Progress Notes (Signed)
Pt. Off BIPAP at this time, pt. Placed on 5L [place], RN made aware, RT will continue to monitor.

## 2020-06-06 NOTE — Progress Notes (Signed)
NAME:  Brad Mendoza, MRN:  [id number], DOB:  [birth year], LOS: 6 ADMISSION DATE:  06/15/2020, CONSULTATION DATE:  4/10  REFERRING MD:  Dr. Otelia Limes, CHIEF COMPLAINT:  Cardiac Arrest    History of Present Illness:  Patient is intubated and sedated; therefore history has been obtained from chart review.  Brad Mendoza is a 65 y.o. 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 Patient presented to the [facility] ED on 4/9 with complaints of seizure-like activity and decreased level of consciousness: last normal 1630 with family finding him w/ slurred speech at 1700. As the patient was being transferred into the ambulance he developed agonal respirations and suffered a generalized tonic-clonic seizure. Seizure aborted w/ Versed but patient did wake up from his postictal phase and was bagged in route to the ED. Upon arrival to the ED he was found to be pulseless and underwent CPR. ROSC after 3-4 rounds, non-shockable rhythm - Epi x3, amiodarone, calcium. Pt had copies projectile emesis after ROSC; agonal respiration; OG dropped before, suctioned, then uncomplicated intubation Non-sustained VT afterwards, followed by sinus tach. Initial BPs with systolic in the 998P. Head CT at OSH showed no acute processes. Telemetry neurology was involved and recommended a CTA; however, was not preformed 2/2 CKD/elevated serum creatinine. [facility] did not have MRI capabilities and the patient was referred for transfer to [facility] MRI on 4/11 did not indicate acute ischemic or hemorrhagic process and was not c/w anoxic brain injury.    Pertinent  Medical History  CAD s/p DES RCA, HFrEF s/p ICD CKD IV HTN, HLD, T2DM GERD Chronic back pain Fracture right lower extremity occurred on 04/17/2020  Significant Hospital Events: Including  procedures, antibiotic start and stop dates in addition to other pertinent events    4/9 Sz, cardiac arrest  4/10 tx to [facility] care  4/10 nonspecific EEG  4/11 largely unremarkable MRI/MRA 06/03/2020 right internal jugular hemodialysis catheter placed without difficulty>> 06/04/2020 4 mm area of restricted diffusion in the mid right subcortical white matter compatible with acute infarct, not seen on the recent MRI 4/15: Extubated  Interim History / Subjective:  Patient tolerated pressure support trial, was successfully extubated yesterday He went into A. fib with RVR last night, requiring IV amiodarone bolus followed by infusion but with that even his heart rate was not controlled, then he was switched to diltiazem infusion Had few beats of nonsustained V. tach  Objective   Blood pressure (!) 164/79, pulse 92, temperature 98.3 F (36.8 C), temperature source Oral, resp. rate (!) 25, height 5\' 9"  (1.753 m), weight 96.2 kg, SpO2 99 %. CVP:  [14 mmHg] 14 mmHg  Vent Mode: BIPAP;PSV FiO2 (%):  [40 %] 40 % PEEP:  [5 cmH20] 5 cmH20 Pressure Support:  [5 cmH20] 5 cmH20   Intake/Output Summary (Last 24 hours) at 06/06/2020 1000 Last data filed at 06/06/2020 0800 Gross per 24 hour  Intake 862.83 ml  Output 2385 ml  Net -1522.17 ml   Filed Weights   06/03/20 0427 06/04/20 0500 06/05/20 0500  Weight: 96.1 kg 96.4 kg 96.2 kg    Examination: General: Chronically ill-appearing male, lying on the bed HEENT: Atraumatic, normocephalic, no JVD, pupils are equal round reactive to light, moist mucous membranes Neuro: Awake, confused, no verbal output.  Following simple commands like  sticking out tongue and closing eyes.  Minimal movement in bilateral upper extremities, withdrawing in bilateral lower extremities CV: Irregularly irregular, no murmur PULM: Diminished in bilateral bases, no wheezes or rhonchi GI: soft, bsx4 active tube feedings at goal Extremities: warm/dry,  edema  right short leg cast in place good capillary refill in her right toes Skin: no rashes or lesions  Labs/imaging that I havepersonally reviewed  (right click and "Reselect all SmartList Selections" daily)  CBC BMP MRI [facility] Problem list     Assessment & Plan:   S/p PEA Cardiac Arrest in the setting of hypoxia Nonsustained ventricular tachycardia Known history of coronary disease and chronic HFrEF increases his risk for sudden cardiac arrest. Cardiac arrest likely 2/2 to seizure versus hypoxia His MRI did not show signs of anoxic injury He started following simple commands Continue telemetry monitoring Had few beats of nonsustained V. tach last night Started on metoprolol  Acute hypoxemic respiratory failure Patient tolerated pressure support trial, had trouble with secretions last night Requiring frequent suctioning and Robinul x1 Now on nasal cannula oxygen 5 to 6 L  Right lower extremity trimalleolar fracture with cast in place. Closed reduction 04/17/20. Dr. Arther Abbott orthopedics last examined 04/22/20 [facility] in [place]. Orthopedic follow-up is appreciated Patient will follow as an outpatient  Paroxysmal A. fib with RVR Patient heart rate went into 150s last night, received 1 bolus of IV amiodarone followed by amiodarone infusion, still his heart rate remains uncontrolled then amiodarone infusion was switched to diltiazem Started on metoprolol 50 mg every 8, try to taper off diltiazem infusion  Aspiration pneumonia/pneumonitis Continue ceftriaxone for 7 days Follow-up respiratory culture  AKI on CKD IV Patient baseline serum creatinine is around 3 Now slowly improving today Nephrology follow-up is appreciated He is nonoliguric Per nephrology no indication of CRRT as of now  Acute metabolic encephalopathy Patient mental status not back to baseline his BUN is elevated > 100, that could be contributing to his mental status MRI did  not show signs of anoxic injury Avoid sedation   Micro-normocytic Anemia  Thrombocytopenia  Decreased Hemoglobin  Transfuse per protocol. No overt source of bleeding May need to hold anticoagulation in near future  Seizure preceded by acute encephalopathy  Pre-cardiac arrest. Likely provoked seizure 2/2 uncontrolled hyperglycemia. UA negative for ketones. No significant PTA ETOH use. UDS +TCH. 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 with LVEF 30% at time of ICD placement in 05/2019.[name] Repeat 2 d echo 4/14 ef 30% CAD s/p DES to RCA 02/2020  Best practice (right click and "Reselect all SmartList Selections" daily)  Diet:  Tube Feed  Pain/Anxiety/Delirium protocol (if indicated): No VAP protocol (if indicated): N/A DVT prophylaxis: LMWH GI prophylaxis: PPI Glucose control:  SSI Yes and Basal insulin Yes Central venous access: Yes, discontinue Arterial line:  N/A Foley:  Yes, and it is still needed Mobility:  bed rest  PT consulted: N/A Last date of multidisciplinary goals of care discussion []  Code Status:  full code Disposition: ICU, patient may go to progressive onset diltiazem infusion is off  Labs   CBC: Recent Labs  Lab  0333 05/30/2020  8413 06/02/20 0118 06/02/20 1702 06/03/20 0054 06/03/20 1706 06/04/20 0424 06/05/20 0322 06/05/20 2308 06/06/20 0107  WBC 15.1*   < > 9.9  --  11.5*  --  9.1 8.1  --  13.4*  NEUTROABS 13.3*  --   --   --   --   --   --  5.9  --  11.6*  HGB 10.4*   < > 8.0*   < > 7.8* 7.8* 7.2* 7.1* 8.2* 7.9*  HCT 32.2*   < > 25.0*   < > 24.7* 25.6* 23.5* 23.1* 24.0* 25.4*  MCV  82.1   < > 80.6  --  82.6  --  84.2 84.6  --  82.7  PLT 195   < > 136*  --  117*  --  140* 169  --  255   < > = values in this interval not displayed.    Basic Metabolic Panel: Recent Labs  Lab 06/01/2020 0333 06/17/2020 0409 06/17/2020 0758 06/01/20 1024 06/03/20 0054 06/03/20 0957 06/03/20 1038 06/04/20 0424 06/05/20 0322 06/05/20 2308 06/06/20 0107  NA 138   < > 137   < > 139 140  --  141 143 147* 148*  K 4.2   < > 4.5   < > 5.3* 5.1  --  4.6 4.4 3.8 4.0  CL 106  --  106   < > 106 107  --  106 109  --  112*  CO2 22  --  24   < > 23 25  --  25 24  --  22  GLUCOSE 202*  --  194*   < > 163* 255*  --  105* 134*  --  94  BUN 45*  --  46*   < > 95* 100*  --  124* 138*  --  128*  CREATININE 2.81*  --  2.96*   < > 3.84* 3.70*  --  3.86* 3.74*  --  3.44*  CALCIUM 9.2  --  8.9   < > 8.1* 8.1*  --  8.0* 8.1*  --  8.7*  MG 2.1  --  2.1  --   --   --   --   --  2.6*  --  2.7*  PHOS 4.8*  --   --   --   --   --  5.8*  --  5.1*  --  3.8   < > = values in this interval not displayed.   GFR: Estimated Creatinine Clearance: 24.8 mL/min (A) (by C-G formula based on SCr of 3.44 mg/dL (H)). Recent Labs  Lab 05/28/2020 0333 05/22/2020 0758 05/29/2020 1300 06/20/2020 1520 06/01/20 1024 06/03/20 0054 06/04/20 0424 06/05/20 0322 06/06/20 0107  PROCALCITON 3.38  --   --   --   --   --   --   --   --   WBC 15.1* 14.7*  --   --    < > 11.5* 9.1 8.1 13.4*  LATICACIDVEN 3.1* 2.8* 2.2* 2.3*  --   --   --   --   --    < > = values in this interval not displayed.    Liver Function Tests: Recent Labs  Lab 06/02/20 1702 06/03/20 0054 06/04/20 0424 06/05/20 0322 06/06/20 0107  AST 20 22 51* 62* 53*  ALT 39 37 39 51* 50*  ALKPHOS 118 118 158* 208* 254*  BILITOT 0.8 0.7 0.6 0.5 0.5  PROT 4.6* 5.0* 4.8* 4.9* 5.9*  ALBUMIN  1.6* 1.6* 1.4* 1.4* 2.1*   Recent Labs  Lab 06/20/2020 0333  LIPASE 28  AMYLASE 152*   No results for input(s): AMMONIA in the last 168 hours.  ABG    Component Value  Date/Time   PHART 7.444 06/05/2020 2308   PCO2ART 33.5 06/05/2020 2308   PO2ART 74 (L) 06/05/2020 2308   HCO3 23.0 06/05/2020 2308   TCO2 24 06/05/2020 2308   ACIDBASEDEF 1.0 06/05/2020 2308   O2SAT 96.0 06/05/2020 2308     Coagulation Profile: Recent Labs  Lab 06/03/20 1038  INR 1.2    Cardiac Enzymes: Recent Labs  Lab 06/03/20 1825  CKTOTAL 175    HbA1C: HB A1C (BAYER DCA - WAIVED)  Date/Time Value Ref Range Status  12/09/2019 01:10 PM 7.8 (H) <7.0 % Final    Comment:                                          Diabetic Adult            <7.0                                       Healthy Adult        4.3 - 5.7                                                           (DCCT/NGSP) American Diabetes Association's Summary of Glycemic Recommendations for Adults with Diabetes: Hemoglobin A1c <7.0%. More stringent glycemic goals (A1c <6.0%) may further reduce complications at the cost of increased risk of hypoglycemia.   09/03/2019 12:51 PM >14.0 (H) <7.0 % Final    Comment:                                          Diabetic Adult            <7.0                                       Healthy Adult        4.3 - 5.7                                                           (DCCT/NGSP) American Diabetes Association's Summary of Glycemic Recommendations for Adults with Diabetes: Hemoglobin A1c <7.0%. More stringent glycemic goals (A1c <6.0%) may further reduce complications at the cost of increased risk of hypoglycemia.    Hgb A1c MFr Bld  Date/Time Value Ref Range Status  05/24/2020 03:33 AM 15.4 (H) 4.8 - 5.6 % Final    Comment:    (NOTE)         Prediabetes: 5.7 - 6.4         Diabetes: >  6.4         Glycemic control for adults with diabetes: <7.0   02/01/2020 06:48 PM 8.1 (H) 4.8 - 5.6 % Final    Comment:    (NOTE) Pre diabetes:          5.7%-6.4%  Diabetes:              >6.4%  Glycemic control for   <7.0% adults with diabetes     CBG: Recent Labs  Lab  06/05/20 1515 06/05/20 1927 06/05/20 2309 06/06/20 0412 06/06/20 0723  GLUCAP 152* 71 74 114* 149*      Total critical care time: 39 minutes  Performed by: [place] care time was exclusive of separately billable procedures and treating other patients.   Critical care was necessary to treat or prevent imminent or life-threatening deterioration.   Critical care was time spent personally by me on the following activities: development of treatment plan with patient and/or surrogate as well as nursing, discussions with consultants, evaluation of patient's response to treatment, examination of patient, obtaining history from patient or surrogate, ordering and performing treatments and interventions, ordering and review of laboratory studies, ordering and review of radiographic studies, pulse oximetry and re-evaluation of patient's condition.   Jacky Kindle MD [facility] See Amion for pager If no response to pager, please call [phone] until 7pm After 7pm, Please call E-link [phone]

## 2020-06-06 NOTE — Progress Notes (Signed)
Subjective: Following some central commands this morning.   Exam: Vitals:   06/06/20 0732 06/06/20 0800  BP:  (!) 154/87  Pulse:  (!) 107  Resp:  (!) 25  Temp:    SpO2: 98% 99%   Gen: In bed, NAD Resp: non-labored breathing, no acute distress Abd: soft, nt  Neuro: MS: Eyes open, does not fixate or track. He follows command to stick out tongue and close eyes with some perseveration. No appendicular commands.  FG:HWEXH, blinks to eyelid stimulation bilaterally.  Does not blink to threat Motor: withdraws to noxious stimulation in all four extremities Sensory:as above  Pertinent Labs: BUN 138 -> 128  Impression: 65 yo M with persistent encephalopathy after admission with hyperosmolar state as well as cardiac arrest. His increasing BUN is certainly going to be a confounding factor in his encephalopathy, but with his improvement today and unimpressive imaging, I think that he has a decent prognosis at this point from a neuro perspective. The tiny stroke is incidental and likely represents decompensated small vessel disease.  Though his low EF could be potentially a source of embolus, with it not being clearly an embolic stroke and the EF is not as low as would typically merit it, I do not think I would start anticoagulation from a stroke prevention standpoint.  I am not certain at this point how much of his encephalopathy is due to his renal failure versus mild hypoxic injury.  Even if it is hypoxic injury, with his improvement seen thus far, I would favor continued aggressive care as he has a likely good chance of recovery even if he does not make a lot of progress in the short-term.  Recommendations: 1) LDL is okay 2) continue ASA 3) ? Whether short term RRT is indicated if uremia is significantly contributing to mental status 4) will follow.   Roland Rack, MD [facility] [phone]  If 7pm- 7am, please page neurology on call as listed in [place].

## 2020-06-06 NOTE — Progress Notes (Signed)
Pt transferred to [id number] without issue. VSS. Report given to receiving RN. Belongings sent with patient.

## 2020-06-06 NOTE — Progress Notes (Signed)
Patient re-evaluated yesterday evening. No ankle deformity noted out of the cast.  No ulcerations observed aside from some superficial thinning of the skin with hypopigmentation over the anterior tibial shaft. Patient does not react to palpation over the medial/lateral malleoli.  Unable to follow commands to assess DF/PF. Plan to leave xeroform with soft dressing in place and follow-up for re-evaluation of the area in several days.

## 2020-06-06 NOTE — Progress Notes (Signed)
Brad Mendoza Progress Note   65 y.o. male Brad Mendoza s/p Brad Mendoza Brad Mendoza s/p ICD Brad Mendoza Brad Mendoza chronic LBP polysubstance abuse, Brad Mendoza with a baseline creatinine in 01/2020 of 2.6-3. Patient also with a  recent RLE fracture and wheelchair bound presenting to the ED 4/9 with decreased AMS and ? Seizure like activity with slurred speech in the field as well. Patient with agonal respirations and TC seizure in the ambulance treated with Versed. In the ED he went pulseless requiring CPR with ROSC after 3-4 rounds requiring Epi x3 Amiodarone and calcium; patient with a non shockable rhythm but did require intubation for airway protection. CT of the head was negative. Patient also treated with Brad Mendoza for the seizures and transferred to Brad Mendoza for a Brad Mendoza. Brad Mendoza positive for Brad Mendoza. Patient UOP has been dropping over the past few days to the oliguric status for the past 2 days prior to consultation.   Assessment/ Plan:   1. AKI or Brad Mendoza with a progression in his CKD with BL cr already 2.6-3 01/2020 presumably secondary to Brad Mendoza + Brad Mendoza. He went through 3-4 rounds of CPR requiring Epi x3, amiodarone and Ca + intubation.  Renal ultrasound reassuring and urine microscopy was also okay but likely suffered ATN. -Urine output, creatinine, BUN all improving today -No indication for RRT but continue to monitor -Albumin improved to 2.1 today after repletion - Avoid nephrotoxic agents as you are already doing and dose for GFR <20 -If kidney function parameters continue to improve tomorrow likely will sign off  2. Brad Mendoza - controlled 3. Brad Mendoza 4. S/p cardiac arrest - not clear the etiology but he has a h/o Brad Mendoza and Brad Mendoza. Neurology following 5. Hypernatremia: Sodium 148 today.  Increase Brad Mendoza to 100 cc/h.  Increase oral free Mendoza as tolerated   Subjective:   Fairly alert today.  Unable to express any complaints.  Atrial fibrillation with RVR overnight now on Brad Mendoza.  Also with significant secretions.  Creatinine  improving.  Urine output improved   Objective:   BP (!) 154/87   Pulse (!) 107   Temp 98.3 F (36.8 C) (Oral)   Resp (!) 25   Ht _0  (1.753 m)   Wt 96.2 kg   SpO2 99%   BMI 31.32 kg/m   Intake/Output Summary (Last 24 hours) at 06/06/2020 2751 Last data filed at 06/06/2020 0600 Gross per 24 hour  Intake 695.42 ml  Output 2235 ml  Net -1539.58 ml   Weight change:   Physical Exam: General: NAD, intubated HEENT:  ETT, moist mucous membranes Lungs: Bilateral chest rise with no increased work of breathing Cardiovascular:RRR,  no JVD Abdomen: soft, non-distended Extremities: tr edema noted in left lower extremity, cast on right lower extremity GU: foley in place     Imaging: DG Ankle 2 Views Right  Result Date: 06/05/2020 CLINICAL DATA:  Right ankle fracture. EXAM: RIGHT ANKLE - 2 VIEW COMPARISON:  May 18, 2020. FINDINGS: Right ankle has been casted and immobilized. Minimally displaced distal right fibular fracture is noted. IMPRESSION: Casting and immobilization of distal right fibular fracture. Electronically Signed   By: Marijo Conception M.D.   On: 06/05/2020 09:02   MR BRAIN WO CONTRAST  Result Date: 06/04/2020 CLINICAL DATA:  Acute neuro deficit.  Suspect stroke. EXAM: Brad Mendoza HEAD WITHOUT CONTRAST TECHNIQUE: Multiplanar, multiecho pulse sequences of the brain and surrounding structures were obtained without intravenous contrast. COMPARISON:  Brad Mendoza head 06/01/2020 FINDINGS: Brain: Small 4 mm area of acute infarct in the mid  right frontal subcortical white matter. This is not seen on the prior diffusion-weighted imaging. Motion degraded study. Generalized atrophy. Minimal white matter changes. Negative for hemorrhage or mass. Vascular: Normal arterial flow voids Skull and upper cervical spine: Negative Sinuses/Orbits: Extensive mucosal edema throughout the paranasal sinuses. Patient is intubated. Negative orbit Other: None IMPRESSION: 4 mm area of restricted diffusion in the mid  right subcortical white matter compatible with acute infarct, not seen on the recent Brad Mendoza Motion degraded study. Electronically Signed   By: Franchot Gallo M.D.   On: 06/04/2020 12:57   DG Chest Port 1 View  Result Date: 06/06/2020 CLINICAL DATA:  Acute respiratory failure EXAM: PORTABLE CHEST 1 VIEW COMPARISON:  06/05/2020 FINDINGS: Dual lumen right IJ approach central venous catheter terminates in the mid SVC. Endotracheal tube has been removed. Transesophageal tube tip terminates below the margin of imaging with the side port near the GE junction. Consider advancing 2-3 cm for optimal function. Pacer pack overlies left chest wall with midline epicardial pacer/defibrillator lead terminating in similar position to prior. Improving lung volumes with some persistent bibasilar hazy and patchy opacities in the mid to lower lungs. No visible pneumothorax or sizable pleural effusion. Stable cardiomegaly. Remaining cardiomediastinal contours are unremarkable. No acute osseous or soft tissue abnormality. Degenerative changes in the spine and left shoulder. IMPRESSION: 1. Improving lung volumes with some persistent hazy and patchy opacities in the mid to lower lungs. 2. Transesophageal tube tip terminates below the margin of imaging with the side port near the GE junction. Consider advancing 2-3 cm for optimal function. 3. Removal of the previously seen endotracheal tube. 4. Right IJ line terminates in the mid SVC. Electronically Signed   By: Lovena Le M.D.   On: 06/06/2020 01:19   DG Chest Port 1 View  Result Date: 06/05/2020 CLINICAL DATA:  Respiratory failure EXAM: PORTABLE CHEST 1 VIEW COMPARISON:  06/03/2020 FINDINGS: Endotracheal tube seen 2.7 cm above the carina. Nasogastric tube extends into the upper abdomen. Right internal jugular temporary dialysis catheter tip noted within the superior vena cava. Bibasilar pulmonary infiltrates have progressed slightly in the interval, likely infectious or  inflammatory. No pneumothorax. No pleural effusion. Mild cardiomegaly is stable. Epicardial defibrillator is unchanged. The pulmonary vascularity is normal. IMPRESSION: Stable support lines and tubes. Progressive bibasilar pulmonary infiltrate, likely infectious or inflammatory. Stable cardiomegaly. Electronically Signed   By: Fidela Salisbury MD   On: 06/05/2020 04:54   DG Abd Portable 1V  Result Date: 06/05/2020 CLINICAL DATA:  NG tube placement EXAM: PORTABLE ABDOMEN - 1 VIEW COMPARISON:  None. FINDINGS: NG tube is in the stomach.  Nonobstructive bowel gas pattern. IMPRESSION: NG tube in the stomach. Electronically Signed   By: Rolm Baptise M.D.   On: 06/05/2020 21:07   EEG adult  Result Date: 06/05/2020 Lora Havens, MD     06/05/2020  9:43 AM Patient Name: Brad Mendoza MRN: [id number] Epilepsy Attending: Lora Havens Referring Physician/Provider: Date: 06/04/2020 Duration: 29.51 mins Patient history: 65 yo M with persistent encephalopathy after admission with hyperosmolar state as well as cardiac arrest. EEG to evaluate for seizure Level of alertness:  lethargic AEDs during EEG study: None Technical aspects: This EEG study was done with scalp electrodes positioned according to the 10-20 International system of electrode placement. Electrical activity was acquired at a sampling rate of _0  and reviewed with a high frequency filter of _1  and a low frequency filter of _2 . EEG data were recorded continuously and digitally stored. Description:  EEG showed continuous generalized predominantly 2 to 3 Hz delta as well as 5 to 6 Hz theta slowing. Hyperventilation and photic stimulation were not performed.   ABNORMALITY - Continuous slow, generalized IMPRESSION: This study is suggestive of /severe diffuse encephalopathy, nonspecific etiology. No seizures or epileptiform discharges were seen throughout the recording. Lora Havens   ECHOCARDIOGRAM LIMITED  Result Date: 06/04/2020     ECHOCARDIOGRAM LIMITED REPORT   Patient Name:   Brad Mendoza Date of Exam: 06/04/2020 Medical Rec #:  [id number]       Height:       69.0 in Accession #:    [id number]      Weight:       212.5 lb Date of Birth:  1955/05/31        BSA:          2.120 m Patient Age:    41 years        BP:           140/75 mmHg Patient Gender: M               HR:           91 bpm. Exam Location:  Inpatient Procedure: Limited Echo, Limited Color Doppler and Color Doppler Indications:    acute respiratory distress  History:        Patient has prior history of Echocardiogram examinations, most                 recent 06/04/2020. Cardiac arrest, CAD, Defibrillator, chronic                 kidney disease, Signs/Symptoms:Altered Mental Status; Risk                 Factors:Current Smoker, Hypertension, Dyslipidemia and Diabetes.  Sonographer:    Johny Chess Referring Phys: [place]  Sonographer Comments: Echo performed with patient supine and on artificial respirator. IMPRESSIONS  1. Left ventricular ejection fraction, by estimation, is 30%. The left ventricle has moderate to severely decreased function. The left ventricle demonstrates global hypokinesis. Left ventricular diastolic parameters are consistent with Grade II diastolic dysfunction (pseudonormalization).  2. Right ventricular systolic function is mildly reduced. The right ventricular size is normal. There is severely elevated pulmonary artery systolic pressure. The estimated right ventricular systolic pressure is 57.3 mmHg.  3. The mitral valve is normal in structure. No evidence of mitral valve regurgitation. No evidence of mitral stenosis.  4. The aortic valve is tricuspid. Aortic valve regurgitation is not visualized. Mild aortic valve sclerosis is present, with no evidence of aortic valve stenosis.  5. The inferior vena cava is dilated in size with <50% respiratory variability, suggesting right atrial pressure of 15 mmHg. FINDINGS  Left Ventricle: Left  ventricular ejection fraction, by estimation, is 30%. The left ventricle has moderate to severely decreased function. The left ventricle demonstrates global hypokinesis. Left ventricular diastolic parameters are consistent with Grade II diastolic dysfunction (pseudonormalization). Right Ventricle: The right ventricular size is normal. No increase in right ventricular wall thickness. Right ventricular systolic function is mildly reduced. There is severely elevated pulmonary artery systolic pressure. The tricuspid regurgitant velocity is 4.07 m/s, and with an assumed right atrial pressure of 15 mmHg, the estimated right ventricular systolic pressure is 22.0 mmHg. Mitral Valve: The mitral valve is normal in structure. Mild mitral annular calcification. No evidence of mitral valve stenosis. Tricuspid Valve: The tricuspid valve is normal in structure. Tricuspid valve regurgitation is  mild. Aortic Valve: The aortic valve is tricuspid. Aortic valve regurgitation is not visualized. Mild aortic valve sclerosis is present, with no evidence of aortic valve stenosis. Venous: The inferior vena cava is dilated in size with less than 50% respiratory variability, suggesting right atrial pressure of 15 mmHg.  LV Volumes (MOD) LV vol d, MOD A4C: 123.0 ml Diastology LV vol s, MOD A4C: 84.6 ml  LV e' medial:    7.29 cm/s LV SV MOD A4C:     123.0 ml LV E/e' medial:  18.0                             LV e' lateral:   9.36 cm/s                             LV E/e' lateral: 14.0  IVC IVC diam: 2.30 cm AORTIC VALVE LVOT Vmax:   78.30 cm/s LVOT Vmean:  51.200 cm/s LVOT VTI:    0.154 m MITRAL VALVE                TRICUSPID VALVE MV Area (PHT): 4.31 cm     TR Peak grad:   66.3 mmHg MV Decel Time: 176 msec     TR Vmax:        407.00 cm/s MV E velocity: 131.00 cm/s MV A velocity: 83.90 cm/s   SHUNTS MV E/A ratio:  1.56         Systemic VTI: 0.15 m Loralie Champagne MD Electronically signed by Loralie Champagne MD Signature Date/Time: 06/04/2020/5:58:46 PM     Final     Labs: BMET Recent Labs  Lab 06/01/2020 7209 05/29/2020 4709 06/01/20 1024 06/02/20 0118 06/03/20 6283 06/03/20 0957 06/03/20 1038 06/04/20 0424 06/05/20 0322 06/05/20 2308 06/06/20 0107  NA 138   < > 137 137 139 140  --  141 143 147* 148*  K 4.2   < > 4.3 4.4 5.3* 5.1  --  4.6 4.4 3.8 4.0  CL 106   < > 106 106 106 107  --  106 109  --  112*  CO2 22   < > _0 --  25 24  --  22  GLUCOSE 202*   < > 230* 220* 163* 255*  --  105* 134*  --  94  BUN 45*   < > 57* 72* 95* 100*  --  124* 138*  --  128*  CREATININE 2.81*   < > 3.05* 3.19* 3.84* 3.70*  --  3.86* 3.74*  --  3.44*  CALCIUM 9.2   < > 8.5* 8.0* 8.1* 8.1*  --  8.0* 8.1*  --  8.7*  PHOS 4.8*  --   --   --   --   --  5.8*  --  5.1*  --  3.8   < > = values in this interval not displayed.   CBC Recent Labs  Lab 05/23/2020 0333 06/16/2020 0409 06/03/20 0054 06/03/20 1706 06/04/20 0424 06/05/20 0322 06/05/20 2308 06/06/20 0107  WBC 15.1*   < > 11.5*  --  9.1 8.1  --  13.4*  NEUTROABS 13.3*  --   --   --   --  5.9  --  11.6*  HGB 10.4*   < > 7.8*   < > 7.2* 7.1* 8.2* 7.9*  HCT 32.2*   < > 24.7*   < >  23.5* 23.1* 24.0* 25.4*  MCV 82.1   < > 82.6  --  84.2 84.6  --  82.7  PLT 195   < > 117*  --  140* 169  --  255   < > = values in this interval not displayed.    Medications:    . sodium chloride   Intravenous Once  . aspirin  81 mg Per Tube Daily  . budesonide (PULMICORT) nebulizer solution  0.5 mg Nebulization BID  . chlorhexidine gluconate (MEDLINE KIT)  15 mL Mouth Rinse BID  . Chlorhexidine Gluconate Cloth  6 each Topical Daily  . docusate  100 mg Per Tube BID  . enoxaparin (LOVENOX) injection  30 mg Subcutaneous Q24H  . feeding supplement (PROSource TF)  45 mL Per Tube TID  . feeding supplement (VITAL HIGH PROTEIN)  1,000 mL Per Tube Q24H  . insulin aspart  0-20 Units Subcutaneous Q4H  . insulin aspart  4 Units Subcutaneous Q4H  . insulin glargine  15 Units Subcutaneous BID  .  ipratropium-albuterol  3 mL Nebulization Q6H  . mouth rinse  15 mL Mouth Rinse q12n4p  . pantoprazole (PROTONIX) IV  40 mg Intravenous QHS  . polyethylene glycol  17 g Per Tube Daily  . rosuvastatin  10 mg Per Tube Daily  . sodium chloride flush  10-40 mL Intracatheter Q12H      Reesa Chew  06/06/2020, 8:26 AM

## 2020-06-06 NOTE — Progress Notes (Signed)
eLink Physician-Brief Progress Note Patient Name: Brad Mendoza DOB: [birth year] MRN: [id number]   Date of Service  06/06/2020  HPI/Events of Note  Patient in Afib with RVR that did not respond to Amiodarone 150 mg iv bolus, blood pressure is 137/85, MAP 102.  eICU Interventions  Will order Cardizem bolus + infusion.        [name] Carman Essick 06/06/2020, 4:23 AM

## 2020-06-07 ENCOUNTER — Inpatient Hospital Stay (HOSPITAL_COMMUNITY): Payer: Medicare Other

## 2020-06-07 ENCOUNTER — Encounter (HOSPITAL_COMMUNITY): Payer: Medicare Other

## 2020-06-07 DIAGNOSIS — G931 Anoxic brain damage, not elsewhere classified: Secondary | ICD-10-CM | POA: Diagnosis not present

## 2020-06-07 DIAGNOSIS — J158 Pneumonia due to other specified bacteria: Secondary | ICD-10-CM

## 2020-06-07 DIAGNOSIS — R609 Edema, unspecified: Secondary | ICD-10-CM

## 2020-06-07 DIAGNOSIS — N184 Chronic kidney disease, stage 4 (severe): Secondary | ICD-10-CM | POA: Diagnosis not present

## 2020-06-07 DIAGNOSIS — N179 Acute kidney failure, unspecified: Secondary | ICD-10-CM | POA: Diagnosis not present

## 2020-06-07 DIAGNOSIS — N189 Chronic kidney disease, unspecified: Secondary | ICD-10-CM | POA: Diagnosis not present

## 2020-06-07 DIAGNOSIS — J9601 Acute respiratory failure with hypoxia: Secondary | ICD-10-CM | POA: Diagnosis not present

## 2020-06-07 LAB — BPAM RBC
Blood Product Expiration Date: 202205142359
Unit Type and Rh: 5100

## 2020-06-07 LAB — TYPE AND SCREEN
ABO/RH(D): O POS
Antibody Screen: NEGATIVE
Unit division: 0

## 2020-06-07 LAB — GLUCOSE, CAPILLARY
Glucose-Capillary: 107 mg/dL — ABNORMAL HIGH (ref 70–99)
Glucose-Capillary: 67 mg/dL — ABNORMAL LOW (ref 70–99)
Glucose-Capillary: 105 mg/dL — ABNORMAL HIGH (ref 70–99)
Glucose-Capillary: 149 mg/dL — ABNORMAL HIGH (ref 70–99)
Glucose-Capillary: 109 mg/dL — ABNORMAL HIGH (ref 70–99)
Glucose-Capillary: 134 mg/dL — ABNORMAL HIGH (ref 70–99)
Glucose-Capillary: 143 mg/dL — ABNORMAL HIGH (ref 70–99)

## 2020-06-07 LAB — COMPREHENSIVE METABOLIC PANEL
ALT: 44 U/L (ref 0–44)
AST: 48 U/L — ABNORMAL HIGH (ref 15–41)
Albumin: 1.9 g/dL — ABNORMAL LOW (ref 3.5–5.0)
Alkaline Phosphatase: 215 U/L — ABNORMAL HIGH (ref 38–126)
Anion gap: 12 (ref 5–15)
BUN: 150 mg/dL — ABNORMAL HIGH (ref 8–23)
CO2: 23 mmol/L (ref 22–32)
Calcium: 8.4 mg/dL — ABNORMAL LOW (ref 8.9–10.3)
Chloride: 113 mmol/L — ABNORMAL HIGH (ref 98–111)
Creatinine, Ser: 3.74 mg/dL — ABNORMAL HIGH (ref 0.61–1.24)
GFR, Estimated: 17 mL/min — ABNORMAL LOW (ref >60)
Glucose, Bld: 129 mg/dL — ABNORMAL HIGH (ref 70–99)
Potassium: 3.8 mmol/L (ref 3.5–5.1)
Sodium: 148 mmol/L — ABNORMAL HIGH (ref 135–145)
Total Bilirubin: 0.4 mg/dL (ref 0.3–1.2)
Total Protein: 5.5 g/dL — ABNORMAL LOW (ref 6.5–8.1)

## 2020-06-07 LAB — POCT I-STAT 7, (LYTES, BLD GAS, ICA,H+H)
Acid-Base Excess: 2 mmol/L (ref 0.0–2.0)
Bicarbonate: 26.8 mmol/L (ref 20.0–28.0)
Calcium, Ion: 1.17 mmol/L (ref 1.15–1.40)
HCT: 19 % — ABNORMAL LOW (ref 39.0–52.0)
Hemoglobin: 6.5 g/dL — CL (ref 13.0–17.0)
O2 Saturation: 100 %
Patient temperature: 99.2
Potassium: 3.7 mmol/L (ref 3.5–5.1)
Sodium: 148 mmol/L — ABNORMAL HIGH (ref 135–145)
TCO2: 28 mmol/L (ref 22–32)
pCO2 arterial: 40.2 mmHg (ref 32.0–48.0)
pH, Arterial: 7.434 (ref 7.350–7.450)
pO2, Arterial: 328 mmHg — ABNORMAL HIGH (ref 83.0–108.0)

## 2020-06-07 LAB — CBC
HCT: 22.5 % — ABNORMAL LOW (ref 39.0–52.0)
Hemoglobin: 7 g/dL — ABNORMAL LOW (ref 13.0–17.0)
MCH: 25.6 pg — ABNORMAL LOW (ref 26.0–34.0)
MCHC: 31.1 g/dL (ref 30.0–36.0)
MCV: 82.4 fL (ref 80.0–100.0)
Platelets: 245 10*3/uL (ref 150–400)
RBC: 2.73 MIL/uL — ABNORMAL LOW (ref 4.22–5.81)
RDW: 16.4 % — ABNORMAL HIGH (ref 11.5–15.5)
WBC: 10.8 10*3/uL — ABNORMAL HIGH (ref 4.0–10.5)
nRBC: 0.3 % — ABNORMAL HIGH (ref 0.0–0.2)

## 2020-06-07 LAB — CULTURE, RESPIRATORY W GRAM STAIN: Gram Stain: NONE SEEN

## 2020-06-07 LAB — HEPATITIS B SURFACE ANTIGEN: Hepatitis B Surface Ag: NONREACTIVE

## 2020-06-07 LAB — PHOSPHORUS: Phosphorus: 3.8 mg/dL (ref 2.5–4.6)

## 2020-06-07 LAB — MAGNESIUM: Magnesium: 2.9 mg/dL — ABNORMAL HIGH (ref 1.7–2.4)

## 2020-06-07 MED ORDER — FENTANYL 2500MCG IN NS 250ML (10MCG/ML) PREMIX INFUSION
50.0000 ug/h | INTRAVENOUS | Status: DC
  Administered 2020-06-07: 50 ug/h via INTRAVENOUS
  Filled 2020-06-07: qty 250

## 2020-06-07 MED ORDER — HEPARIN SODIUM (PORCINE) 1000 UNIT/ML IJ SOLN
INTRAMUSCULAR | Status: AC
  Administered 2020-06-07: 2800 [IU]
  Filled 2020-06-07: qty 4

## 2020-06-07 MED ORDER — PROPOFOL 1000 MG/100ML IV EMUL
5.0000 ug/kg/min | INTRAVENOUS | Status: DC
  Administered 2020-06-07: 30 ug/kg/min via INTRAVENOUS
  Administered 2020-06-07: 10 ug/kg/min via INTRAVENOUS
  Filled 2020-06-07: qty 100

## 2020-06-07 MED ORDER — PROPOFOL 10 MG/ML IV BOLUS: 100.0000 mg | Freq: Once | INTRAVENOUS | Status: AC

## 2020-06-07 MED ORDER — ORAL CARE MOUTH RINSE
15.0000 mL | OROMUCOSAL | Status: DC
  Administered 2020-06-07 – 2020-06-12 (×46): 15 mL via OROMUCOSAL

## 2020-06-07 MED ORDER — FENTANYL CITRATE (PF) 100 MCG/2ML IJ SOLN: 100.0000 ug | Freq: Once | INTRAMUSCULAR | Status: AC

## 2020-06-07 MED ORDER — SODIUM CHLORIDE 0.9 % IV SOLN
100.0000 mL | INTRAVENOUS | Status: DC | PRN
  Administered 2020-06-11: 100 mL via INTRAVENOUS

## 2020-06-07 MED ORDER — PENTAFLUOROPROP-TETRAFLUOROETH EX AERO: 1.0000 "application " | INHALATION_SPRAY | CUTANEOUS | Status: DC | PRN

## 2020-06-07 MED ORDER — FREE WATER
300.0000 mL | Status: DC
  Administered 2020-06-07 – 2020-06-09 (×12): 300 mL

## 2020-06-07 MED ORDER — HEPARIN SODIUM (PORCINE) 1000 UNIT/ML DIALYSIS: 1000.0000 [IU] | INTRAMUSCULAR | Status: DC | PRN

## 2020-06-07 MED ORDER — SODIUM CHLORIDE 0.9 % IV SOLN
INTRAVENOUS | Status: DC | PRN
  Administered 2020-06-07: 1000 mL via INTRAVENOUS

## 2020-06-07 MED ORDER — IPRATROPIUM-ALBUTEROL 0.5-2.5 (3) MG/3ML IN SOLN: 3.0000 mL | Freq: Four times a day (QID) | RESPIRATORY_TRACT | Status: DC | PRN

## 2020-06-07 MED ORDER — DEXTROSE 5 % IV SOLN: INTRAVENOUS | Status: DC

## 2020-06-07 MED ORDER — SODIUM CHLORIDE 0.9 % IV SOLN: 100.0000 mL | INTRAVENOUS | Status: DC | PRN

## 2020-06-07 MED ORDER — SODIUM CHLORIDE 0.9 % IV SOLN
3.0000 g | Freq: Two times a day (BID) | INTRAVENOUS | Status: DC
  Administered 2020-06-07 – 2020-06-11 (×10): 3 g via INTRAVENOUS
  Filled 2020-06-07 (×10): qty 8

## 2020-06-07 MED ORDER — CHLORHEXIDINE GLUCONATE 0.12% ORAL RINSE (MEDLINE KIT)
15.0000 mL | Freq: Two times a day (BID) | OROMUCOSAL | Status: DC
  Administered 2020-06-07 – 2020-06-12 (×9): 15 mL via OROMUCOSAL

## 2020-06-07 MED ORDER — FENTANYL CITRATE (PF) 100 MCG/2ML IJ SOLN
INTRAMUSCULAR | Status: AC
  Administered 2020-06-07: 100 ug via INTRAVENOUS
  Filled 2020-06-07: qty 2

## 2020-06-07 MED ORDER — INSULIN GLARGINE 100 UNIT/ML ~~LOC~~ SOLN
12.0000 [IU] | Freq: Two times a day (BID) | SUBCUTANEOUS | Status: DC
  Administered 2020-06-07 – 2020-06-09 (×6): 12 [IU] via SUBCUTANEOUS
  Filled 2020-06-07 (×8): qty 0.12

## 2020-06-07 MED ORDER — ACETAMINOPHEN 160 MG/5ML PO SOLN
650.0000 mg | Freq: Four times a day (QID) | ORAL | Status: DC | PRN
  Administered 2020-06-08 – 2020-06-11 (×4): 650 mg
  Filled 2020-06-07 (×4): qty 20.3

## 2020-06-07 MED ORDER — HEPARIN SODIUM (PORCINE) 1000 UNIT/ML IJ SOLN: 1000.0000 [IU] | INTRAMUSCULAR | Status: DC | PRN

## 2020-06-07 MED ORDER — PROPOFOL 1000 MG/100ML IV EMUL
INTRAVENOUS | Status: AC
  Administered 2020-06-07: 100 mg via INTRAVENOUS
  Filled 2020-06-07: qty 100

## 2020-06-07 MED ORDER — FENTANYL BOLUS VIA INFUSION
50.0000 ug | INTRAVENOUS | Status: DC | PRN
  Filled 2020-06-07: qty 100

## 2020-06-07 MED ORDER — FREE WATER
200.0000 mL | Status: DC
  Administered 2020-06-07: 200 mL

## 2020-06-07 MED ORDER — ALTEPLASE 2 MG IJ SOLR: 2.0000 mg | Freq: Once | INTRAMUSCULAR | Status: DC | PRN

## 2020-06-07 MED ORDER — LIDOCAINE-PRILOCAINE 2.5-2.5 % EX CREA: 1.0000 "application " | TOPICAL_CREAM | CUTANEOUS | Status: DC | PRN

## 2020-06-07 MED ORDER — LIDOCAINE HCL (PF) 1 % IJ SOLN: 5.0000 mL | INTRAMUSCULAR | Status: DC | PRN

## 2020-06-07 MED ORDER — FUROSEMIDE 10 MG/ML IJ SOLN
60.0000 mg | Freq: Once | INTRAMUSCULAR | Status: AC
  Administered 2020-06-07: 60 mg via INTRAVENOUS
  Filled 2020-06-07: qty 6

## 2020-06-07 MED ORDER — HEPARIN SODIUM (PORCINE) 1000 UNIT/ML IJ SOLN
1000.0000 [IU] | INTRAMUSCULAR | Status: DC | PRN
  Administered 2020-06-07: 3000 [IU]
  Filled 2020-06-07: qty 4
  Filled 2020-06-07: qty 3

## 2020-06-07 MED ORDER — FENTANYL CITRATE (PF) 100 MCG/2ML IJ SOLN
50.0000 ug | Freq: Once | INTRAMUSCULAR | Status: AC
  Administered 2020-06-07: 50 ug via INTRAVENOUS

## 2020-06-07 MED ORDER — CHLORHEXIDINE GLUCONATE CLOTH 2 % EX PADS
6.0000 | MEDICATED_PAD | Freq: Every day | CUTANEOUS | Status: DC
  Administered 2020-06-07: 6 via TOPICAL

## 2020-06-07 NOTE — Progress Notes (Signed)
eLink Physician-Brief Progress Note Patient Name: Brad Mendoza DOB: 1956-01-13 MRN: [id number]   Date of Service  06/07/2020  HPI/Events of Note  Patient extubated and transferred out of the ICU last night but then developed acute hypoxemic / hypercapnic respiratory failure and Rapid Response was called, patient is now back in the ICU intubated, sedated and mechanically ventilated.  eICU Interventions  New Patient Evaluation completed.        Brad Mendoza Brad Mendoza 06/07/2020, 1:14 AM

## 2020-06-07 NOTE — Progress Notes (Signed)
Received patient from [facility] [address] at 0130 am via rapid transfer. Patient intubated upon arrival by dr Celesta Aver.

## 2020-06-07 NOTE — Progress Notes (Signed)
NAME:  Brad Mendoza, MRN:  [id number], DOB:  [birth year], LOS: 7 ADMISSION DATE:  05/26/2020, CONSULTATION DATE:  4/10  REFERRING MD:  Dr. Otelia Limes, CHIEF COMPLAINT:  Cardiac Arrest    History of Present Illness:  Patient is intubated and sedated; therefore history has been obtained from chart review.  Brad Mendoza is a 65 y.o. 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 Patient presented to the [facility] ED on 4/9 with complaints of seizure-like activity and decreased level of consciousness: last normal 1630 with family finding him w/ slurred speech at 1700. As the patient was being transferred into the ambulance he developed agonal respirations and suffered a generalized tonic-clonic seizure. Seizure aborted w/ Versed but patient did wake up from his postictal phase and was bagged in route to the ED. Upon arrival to the ED he was found to be pulseless and underwent CPR. ROSC after 3-4 rounds, non-shockable rhythm - Epi x3, amiodarone, calcium. Pt had copies projectile emesis after ROSC; agonal respiration; OG dropped before, suctioned, then uncomplicated intubation Non-sustained VT afterwards, followed by sinus tach. Initial BPs with systolic in the 101B. Head CT at OSH showed no acute processes. Telemetry neurology was involved and recommended a CTA; however, was not preformed 2/2 CKD/elevated serum creatinine. [facility] did not have MRI capabilities and the patient was referred for transfer to [facility]. MRI on 4/11 did not indicate acute ischemic or hemorrhagic process and was not c/w anoxic brain injury.    Pertinent  Medical History  CAD s/p DES RCA, HFrEF s/p ICD CKD IV HTN, HLD, T2DM GERD Chronic back pain Fracture right lower extremity occurred on 04/17/2020  Significant Hospital Events: Including  procedures, antibiotic start and stop dates in addition to other pertinent events    4/9 Sz, cardiac arrest  4/10 tx [facility]  4/10 nonspecific EEG  4/11 largely unremarkable MRI/MRA 06/03/2020 right internal jugular hemodialysis catheter placed without difficulty>> 06/04/2020 4 mm area of restricted diffusion in the mid right subcortical white matter compatible with acute infarct, not seen on the recent MRI 4/15: Extubated 4/16: Patient was transferred to progressive care, overnight he had lots of secretions, unable to clear, light suctioning patient was noted to be bleeding from upper airway, became unresponsive, and hyperresponsiveness called and transferred to ICU, intubated  Interim History / Subjective:  Patient was transferred to progressive care, overnight he had lots of secretions, unable to clear, light suctioning patient was noted to be bleeding from upper airway, became unresponsive, and hyperresponsiveness called and transferred to ICU, intubated  BUN and creatinine keep rising  Objective   Blood pressure (!) 145/60, pulse 89, temperature 100.3 F (37.9 C), temperature source Oral, resp. rate 19, height 5\' 9"  (1.753 m), weight 89.9 kg, SpO2 94 %.    Vent Mode: PSV;CPAP FiO2 (%):  [40 %-100 %] 40 % Set Rate:  [16 bmp] 16 bmp Vt Set:  [560 mL] 560 mL PEEP:  [5 cmH20] 5 cmH20 Pressure Support:  [10 cmH20] 10 cmH20 Plateau Pressure:  [11 cmH20-15 cmH20] 11 cmH20   Intake/Output Summary (Last 24 hours) at 06/07/2020 1215 Last data filed at 06/07/2020 1112 Gross per 24 hour  Intake 666.43 ml  Output 1425 ml  Net -758.57 ml   Filed Weights   06/04/20 0500 06/05/20 0500  06/07/20 0357  Weight: 96.4 kg 96.2 kg 89.9 kg    Examination: General: Chronically ill-appearing male, lying on the bed, orally intubated HEENT: Atraumatic, normocephalic, no JVD, pupils are equal round reactive to light, moist mucous membranes Neuro: Opens eyes with vocal stimuli,  not following commands. Minimal movement in bilateral upper extremities, withdrawing in bilateral lower extremities CV: Regular rate and rhythm, no murmur PULM: Diminished in bilateral bases, no wheezes or rhonchi GI: soft, bsx4 active tube feedings at goal Extremities: warm/dry,  edema right short leg cast in place good capillary refill in her right toes Skin: no rashes or lesions  Labs/imaging that I havepersonally reviewed  (right click and "Reselect all SmartList Selections" daily)  Sodium 148, serum creatinine 3.4, BUN 150  Resolved Hospital Problem list     Assessment & Plan:   S/p PEA Cardiac Arrest in the setting of hypoxia and seizure Nonsustained ventricular tachycardia Known history of coronary disease and chronic HFrEF increases his risk for sudden cardiac arrest. Cardiac arrest likely 2/2 to seizure versus hypoxia MRI brain did not show signs of anoxic injury Continue telemetry monitoring Episodes of V. tach Continue metoprolol  Acute hypoxemic respiratory failure Patient was intubated, 4/16 due to unable to clear secretions and protect his airway He was found to be hypoxic and unresponsive Received multiple doses of Robinul  Continue lung protective ventilation, currently tolerating pressure support but mental status remain poor If his mental state does not improve over the next few days, he may need a tracheostomy  Right lower extremity trimalleolar fracture status post Closed reduction 04/17/20. Dr. Arther Abbott orthopedics last examined 04/22/20 [facility] in [place]. Orthopedic follow-up is appreciated Lower extremity cast was removed  Paroxysmal A. fib with RVR Patient converted back to sinus rhythm with controlled rate Continue metoprolol   Right lower lobe pneumonia actinobacter Respiratory culture grew actinobacter, pansensitive Ceftriaxone switched to Unasyn  AKI on CKD IV Patient baseline serum creatinine is around 3 Patient BUN and  serum creatinine continue to rise Nephrology follow-up is appreciated, recommend initiating CRRT today and tomorrow  Acute metabolic encephalopathy Patient mental status not back to baseline his BUN is elevated > 100, that could be contributing to his mental status MRI did not show signs of anoxic injury Avoid sedation CRRT today to see if he has component of uremic encephalopathy   Micro-normocytic Anemia  Thrombocytopenia  Decreased Hemoglobin  Transfuse per protocol. No overt source of bleeding May need to hold anticoagulation in near future  Seizure preceded by acute encephalopathy  Pre-cardiac arrest. Likely provoked seizure 2/2 uncontrolled hyperglycemia. UA negative for ketones. No significant PTA ETOH use. UDS +TCH. 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 with LVEF 30% at time of ICD placement in 05/2019.[name] Repeat 2 d echo 4/14 ef 30% CAD s/p DES to RCA 02/2020  Best practice (right click and "Reselect all SmartList Selections" daily)  Diet:  Tube Feed  Pain/Anxiety/Delirium protocol (if indicated): No VAP protocol (if indicated): N/A DVT prophylaxis: LMWH GI prophylaxis: PPI Glucose control:  SSI Yes and Basal insulin Yes Central venous access: Yes, discontinue Arterial line:  N/A Foley:  Yes, and it is  still needed Mobility:  bed rest  PT consulted: N/A Last date of multidisciplinary goals of care discussion [4/17 patient's daughter and ex-wife was called and updated over the phone] Code Status:  full code Disposition: ICU  Labs   CBC: Recent Labs  Lab 06/03/20 0054 06/03/20 1706  06/04/20 0424 06/05/20 0322 06/05/20 2308 06/06/20 0107 06/07/20 0210 06/07/20 0228  WBC 11.5*  --  9.1 8.1  --  13.4*  --  10.8*  NEUTROABS  --   --   --  5.9  --  11.6*  --   --   HGB 7.8*   < > 7.2* 7.1* 8.2* 7.9* 6.5* 7.0*  HCT 24.7*   < > 23.5* 23.1* 24.0* 25.4* 19.0* 22.5*  MCV 82.6  --  84.2 84.6  --  82.7  --  82.4  PLT 117*  --  140* 169  --  255  --  245   < > = values in this interval not displayed.    Basic Metabolic Panel: Recent Labs  Lab 06/03/20 0957 06/03/20 1038 06/04/20 0424 06/05/20 0322 06/05/20 2308 06/06/20 0107 06/07/20 0210 06/07/20 0228  NA 140  --  141 143 147* 148* 148* 148*  K 5.1  --  4.6 4.4 3.8 4.0 3.7 3.8  CL 107  --  106 109  --  112*  --  113*  CO2 25  --  25 24  --  22  --  23  GLUCOSE 255*  --  105* 134*  --  94  --  129*  BUN 100*  --  124* 138*  --  128*  --  150*  CREATININE 3.70*  --  3.86* 3.74*  --  3.44*  --  3.74*  CALCIUM 8.1*  --  8.0* 8.1*  --  8.7*  --  8.4*  MG  --   --   --  2.6*  --  2.7*  --  2.9*  PHOS  --  5.8*  --  5.1*  --  3.8  --  3.8   GFR: Estimated Creatinine Clearance: 22.1 mL/min (A) (by C-G formula based on SCr of 3.74 mg/dL (H)). Recent Labs  Lab 06/09/2020 1300 06/04/2020 1520 06/01/20 1024 06/04/20 0424 06/05/20 0322 06/06/20 0107 06/07/20 0228  WBC  --   --    < > 9.1 8.1 13.4* 10.8*  LATICACIDVEN 2.2* 2.3*  --   --   --   --   --    < > = values in this interval not displayed.    Liver Function Tests: Recent Labs  Lab 06/03/20 0054 06/04/20 0424 06/05/20 0322 06/06/20 0107 06/07/20 0228  AST 22 51* 62* 53* 48*  ALT 37 39 51* 50* 44  ALKPHOS 118 158* 208* 254* 215*  BILITOT 0.7 0.6 0.5 0.5 0.4  PROT 5.0* 4.8* 4.9* 5.9* 5.5*  ALBUMIN 1.6* 1.4* 1.4* 2.1* 1.9*   No results for input(s): LIPASE, AMYLASE in the last 168 hours. No results for input(s): AMMONIA in the last 168 hours.  ABG    Component Value Date/Time   PHART 7.434 06/07/2020 0210   PCO2ART 40.2 06/07/2020 0210    PO2ART 328 (H) 06/07/2020 0210   HCO3 26.8 06/07/2020 0210   TCO2 28 06/07/2020 0210   ACIDBASEDEF 1.0 06/05/2020 2308   O2SAT 100.0 06/07/2020 0210     Coagulation Profile: Recent Labs  Lab 06/03/20 1038  INR 1.2    Cardiac Enzymes: Recent Labs  Lab 06/03/20 1825  CKTOTAL 175    HbA1C: HB A1C (BAYER DCA - WAIVED)  Date/Time Value Ref Range Status  12/09/2019 01:10 PM 7.8 (H) <7.0 % Final    Comment:                                          Diabetic Adult            <7.0                                       Healthy Adult        4.3 - 5.7                                                           (DCCT/NGSP) American Diabetes Association's Summary of Glycemic Recommendations for Adults with Diabetes: Hemoglobin A1c <7.0%. More stringent glycemic goals (A1c <6.0%) may further reduce complications at the cost of increased risk of hypoglycemia.   09/03/2019 12:51 PM >14.0 (H) <7.0 % Final    Comment:                                          Diabetic Adult            <7.0                                       Healthy Adult        4.3 - 5.7                                                           (DCCT/NGSP) American Diabetes Association's Summary of Glycemic Recommendations for Adults with Diabetes: Hemoglobin A1c <7.0%. More stringent glycemic goals (A1c <6.0%) may further reduce complications at the cost of increased risk of hypoglycemia.    Hgb A1c MFr Bld  Date/Time Value Ref Range Status  06/13/2020 03:33 AM 15.4 (H) 4.8 - 5.6 % Final    Comment:    (NOTE)         Prediabetes: 5.7 - 6.4         Diabetes: >6.4         Glycemic control for adults with diabetes: <7.0   02/01/2020 06:48 PM 8.1 (H) 4.8 - 5.6 % Final    Comment:    (NOTE) Pre diabetes:          5.7%-6.4%  Diabetes:              >6.4%  Glycemic control for   <7.0% adults with diabetes     CBG: Recent Labs  Lab 06/06/20 2348 06/07/20 0401 06/07/20 0744 06/07/20 0921 06/07/20 1112   GLUCAP 157* 107* 67* 105* 149*      Total critical care time:  36 minutes  Performed by: [place] care time was exclusive of separately billable procedures and treating other patients.   Critical care was necessary to treat or prevent imminent or life-threatening deterioration.   Critical care was time spent personally by me on the following activities: development of treatment plan with patient and/or surrogate as well as nursing, discussions with consultants, evaluation of patient's response to treatment, examination of patient, obtaining history from patient or surrogate, ordering and performing treatments and interventions, ordering and review of laboratory studies, ordering and review of radiographic studies, pulse oximetry and re-evaluation of patient's condition.   Jacky Kindle MD [facility] See Amion for pager If no response to pager, please call [phone] until 7pm After 7pm, Please call E-link [phone]

## 2020-06-07 NOTE — Progress Notes (Signed)
Subjective: Extubated yesterday, but re-worsened.  Exam: Vitals:   06/07/20 0930 06/07/20 1000  BP: (!) 164/59 (!) 144/84  Pulse: 81 88  Resp: 18 20  Temp:    SpO2: 95% 96%   Gen: In bed, NAD Resp: non-labored breathing, no acute distress Abd: soft, nt  Neuro: MS: Eyes open, does not fixate or track. He follows command to stick out tongue and close eyes with some perseveration. No appendicular commands.  UL:AGTXM, blinks to eyelid stimulation bilaterally.  Does not blink to threat Motor: withdraws to noxious stimulation in all four extremities Sensory:as above  Pertinent Labs: BUN 138 -> 128 -> 150  Impression: 65 yo M with persistent encephalopathy after admission with hyperosmolar state as well as cardiac arrest. His increasing BUN is certainly going to be a confounding factor in his encephalopathy, but with his improvement today and unimpressive imaging, I think that he has a decent prognosis at this point from a neuro perspective. The tiny stroke is incidental and likely represents decompensated small vessel disease.  Though his low EF could be potentially a source of embolus, with it not being clearly an embolic stroke and the EF is not as low as would typically merit it, I do not think I would start anticoagulation from a stroke prevention standpoint.  I am not certain at this point how much of his encephalopathy is due to his renal failure versus mild hypoxic injury.  Even if it is hypoxic injury, with his improvement seen thus far, I would favor continued aggressive care as he has a likely good chance of recovery even if he does not make a lot of progress in the short-term.  With re-worsening, will monitor overnight with EEG, but suspect that this is more metabolic.   Recommendations: 1) would consider RRT if indicated for uremic encephalopathy 2) overnight EEG.  3) If EEG is negative overnight, would stop tomorrow and would not pursue further neurological workup.   Roland Rack, MD [facility] [phone]  If 7pm- 7am, please page neurology on call as listed in [place].

## 2020-06-07 NOTE — Progress Notes (Signed)
VASCULAR LAB    Bilateral upper extremity venous duplex has been performed.  See CV proc for preliminary results.   Claris Pech, RVT 06/07/2020, 11:16 AM

## 2020-06-07 NOTE — Progress Notes (Signed)
Pt continued to produce more secretions frequently, bloody, needing more attention, rapid RN was made aware and critical care was notified by Rapid RN, MD came to see the pt and was transferred to ICU, Spouse was made aware ([phone]), pt was transported to ICU and report was handed to ICU RN

## 2020-06-07 NOTE — Progress Notes (Signed)
RT note. Pt. ETT withdrawn 25 to 22cm cm, RT will continue to montior

## 2020-06-07 NOTE — Progress Notes (Signed)
0744 CBG 67, spoke with MD and started 5% dextrose and will increase tube feeds as tolerated. Recheck CBG at 0925 was 105. Dextrose was discontinued. Will continue to monitor.   Brad Mendoza

## 2020-06-07 NOTE — Progress Notes (Addendum)
PHARMACY NOTE:  ANTIMICROBIAL RENAL DOSAGE ADJUSTMENT  Current antimicrobial regimen includes a mismatch between antimicrobial dosage and estimated renal function.  As per policy approved by the Pharmacy & Therapeutics and Medical Executive Committees, the antimicrobial dosage will be adjusted accordingly.  Current antimicrobial dosage:  Unasyn 3g IV q8h  Indication: Aspiration  Renal Function:  Estimated Creatinine Clearance: 22.1 mL/min (A) (by C-G formula based on SCr of 3.74 mg/dL (H)). []      On intermittent HD, scheduled: []      On CRRT    Antimicrobial dosage has been changed to:  Unasyn 3g IV q12h  Additional comments: Discussed Penicillin allergy of rash with MD as well as GNR in TA still pending identification. MD wants to trial Unasyn and more concerned for aspiration so still wants to narrow. RN aware to monitor for signs of allergic reaction.   Thank you for allowing pharmacy to be a part of this patient's care.  Brain Hilts, [facility] 06/07/2020 8:31 AM

## 2020-06-07 NOTE — Procedures (Signed)
Central Venous Catheter Insertion Procedure Note  Brad Mendoza  [id number]  25-Oct-1955  Date:06/07/20  Time:12:11 PM   Provider Performing:Dimonique Bourdeau   Procedure: Insertion of Non-tunneled Central Venous Catheter(36556)with US guidance (00712)    Indication(s) Hemodialysis  Consent Risks of the procedure as well as the alternatives and risks of each were explained to the patient and/or caregiver.  Consent for the procedure was obtained and is signed in the bedside chart  Anesthesia Topical only with 1% lidocaine   Timeout Verified patient identification, verified procedure, site/side was marked, verified correct patient position, special equipment/implants available, medications/allergies/relevant history reviewed, required imaging and test results available.  Sterile Technique Maximal sterile technique including full sterile barrier drape, hand hygiene, sterile gown, sterile gloves, mask, hair covering, sterile ultrasound probe cover (if used).  Procedure Description Area of catheter insertion was cleaned with chlorhexidine and draped in sterile fashion.   With real-time ultrasound guidance a HD catheter was placed into the left subclavian vein.  Nonpulsatile blood flow and easy flushing noted in all ports.  The catheter was sutured in place and sterile dressing applied.  Complications/Tolerance None; patient tolerated the procedure well. Chest X-ray is ordered to verify placement for internal jugular or subclavian cannulation.  Chest x-ray is not ordered for femoral cannulation.  EBL Minimal  Specimen(s) None

## 2020-06-07 NOTE — Progress Notes (Signed)
eLink Physician-Brief Progress Note Patient Name: Brad Mendoza DOB: [birth year] MRN: [id number]   Date of Service  06/07/2020  HPI/Events of Note  Patient extubated and transferred out of the ICU yesterday evening, now having respiratory difficulties so Rapid Response was called to see patient, patient is still on the PCCM service.  eICU Interventions  PCCM Ground Crew requested to evaluate patient.        [name] Ray Gervasi 06/07/2020, 12:16 AM

## 2020-06-07 NOTE — Progress Notes (Signed)
Over night EEG in process- results pending

## 2020-06-07 NOTE — Procedures (Signed)
Intubation Procedure Note  JORGE RETZ  [id number]  05/21/1955  Date:06/07/20  Time:1:20 AM   Provider Performing:Keonna Raether Jarome Matin, MD    Procedure: Intubation (94709)  Indication(s) Respiratory Failure, airway compromise  Consent Patient's clinical condition was explained to patient's wife and she gave consent for intubation.   Anesthesia Fentanyl and Propofol   Time Out Verified patient identification, verified procedure, site/side was marked, verified correct patient position, special equipment/implants available, medications/allergies/relevant history reviewed, required imaging and test results available.   Sterile Technique Usual hand hygeine, masks, and gloves were used   Procedure Description Patient positioned in bed supine.  Sedation given as noted above.  Patient was intubated with endotracheal tube using Glidescope.  View was Grade 1 full glottis .  Number of attempts was 1.  Colorimetric CO2 detector was consistent with tracheal placement.   Complications/Tolerance None; patient tolerated the procedure well. There were copious bloody secretions in the oropharynx. Chest X-ray was reviewed and ET tube was pulled backed by 3 cm.   EBL Bloody secretions were cleared from the oropharynx   Specimen(s) None

## 2020-06-07 NOTE — Progress Notes (Signed)
Night CCM team Event Note  Brad Mendoza is a 65 year old gentleman who was admitted to hospital after a seizure event that deteriorated into cardiac arrest.  Patient required endotracheal intubation but improved and was extubated on 06/05/2020.  He was transferred out of ICU on 06/06/2020.  A rapid response was called to evaluate patient because of worsening respiratory distress as patient was unable to clear his secretions.  Patient's nurse reported having to suction patient excessively. Hence Frederickson requested bedside MD evaluation.  On examination patient is awake but is non verbal and follows no commands. Neuro: pupils symmetrical HEENT: bloody oral secretion being suctioned  CVS: S1, S2, HR 72 SR, BP 162/78, 1+ pitting oedema -lower extremities Resp: upper airway rhonchi, No clear stridor,  clear lung Liggett, RR 32, sats 94% GI soft, rectal tube in place Renal: foley in place Muscu: right > left upper extremity oedema Skin: no breakdown.  Assessment and Plan:  1. Acute hypoxic respiratory failure secondary to Airway compromise-inability to management secretions. Patient demonstrating fatigue. Patient's nurse reported bloody secretions Plan: patient transferred back to the ICU and re intubated. During re intubation, patient noted to have large amount of bloody secretion Pooled in the oropharynx. Will continue DVT prophylaxis given risk vs benefits Ratio. Since no further suctioning, trauma should resolve.  2. Upper extremities oedema R> Left Patient is on DVT prophylaxis Plan: venous doppler ordedered.  Thank you for allowing me the privilege to care for this patient.

## 2020-06-07 NOTE — Progress Notes (Signed)
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 Patient also with a  recent RLE fracture and wheelchair bound presenting to the ED 4/9 with decreased AMS and ? Seizure like activity with slurred speech in the field as well. Patient with agonal respirations and TC seizure in the ambulance treated with Versed. In the ED he went pulseless requiring CPR with ROSC after 3-4 rounds requiring Epi x3 Amiodarone and calcium; patient with a non shockable rhythm but did require intubation for airway protection. CT of the head was negative. Patient also treated with Keppra for the seizures and transferred to [facility] for a MRI. UDS positive for THC. Patient UOP has been dropping over the past few days to the oliguric status for the past 2 days prior to consultation.   Assessment/ Plan:   1. 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 He went through 3-4 rounds of CPR requiring Epi x3, amiodarone and Ca + intubation.  Renal ultrasound reassuring and urine microscopy was also okay but likely suffered ATN. -Renal parameters were improving yesterday but now with reintubation his mental status is worsened and BUN is rising.  He does demonstrate some signs of possible clinical uremia -We will plan to start dialysis today with short 2-hour session and tentatively plan for repeat session tomorrow to help with uremia -Appreciate CCM placement of catheter - Avoid nephrotoxic agents as you are already doing and dose for GFR <20  2. HTN - controlled 3. DM 4. S/p cardiac arrest - not clear the etiology but he has a h/o HFrEF and CASHD. Neurology following 5. Hypernatremia: Sodium 148 today.  Likely will improve with dialysis 6. Hypoxic respiratory failure: With copious secretions.  Management per CCM   Subjective:    Patient was extubated yesterday and transferred to the floor.  The patient had worsening respiratory distress and increased copious secretions.  The patient was sent back to the ICU and reintubated.  Creatinine slightly worse with low urine output and rising BUN.   Objective:   BP (!) 156/64   Pulse 76   Temp 99 F (37.2 C) (Oral)   Resp 16   Ht _0  (1.753 m)   Wt 89.9 kg   SpO2 97%   BMI 29.27 kg/m   Intake/Output Summary (Last 24 hours) at 06/07/2020 0847 Last data filed at 06/07/2020 0800 Gross per 24 hour  Intake 844.42 ml  Output 1125 ml  Net -280.58 ml   Weight change:   Physical Exam: General: NAD, intubated HEENT:  ETT, moist mucous membranes Lungs: Bilateral chest rise with no increased work of breathing Cardiovascular:RRR,  no JVD Abdomen: soft, non-distended Extremities: tr edema noted in left lower extremity, cast on right lower extremity Neuro: Minimally interactive today    Imaging: DG Ankle 2 Views Right  Result Date: 06/05/2020 CLINICAL DATA:  Right ankle fracture. EXAM: RIGHT ANKLE - 2 VIEW COMPARISON:  May 18, 2020. FINDINGS: Right ankle has been casted and immobilized. Minimally displaced distal right fibular fracture is noted. IMPRESSION: Casting and immobilization of distal right fibular fracture. Electronically Signed   By: Marijo Conception M.D.   On: 06/05/2020 09:02   Portable Chest x-ray  Result Date: 06/07/2020 CLINICAL DATA:  ET tube placement EXAM: PORTABLE CHEST 1 VIEW COMPARISON:  06/06/2020 FINDINGS:  ET tube tip just into the left mainstem bronchus. Recommend retracting 3 cm. Cardiomegaly, vascular congestion. Right dialysis catheter has been removed. Defibrillator and NG tube are unchanged. IMPRESSION: Endotracheal tube tip chest into the left mainstem bronchus. Recommend retracting 3 cm. Cardiomegaly, vascular congestion. These results were called by telephone at the time of interpretation on 06/07/2020 at 1:24 am to the patient's nurse,  who verbally acknowledged these results. Electronically Signed   By: Rolm Baptise M.D.   On: 06/07/2020 01:24   DG Chest Port 1 View  Result Date: 06/06/2020 CLINICAL DATA:  Acute respiratory failure EXAM: PORTABLE CHEST 1 VIEW COMPARISON:  06/05/2020 FINDINGS: Dual lumen right IJ approach central venous catheter terminates in the mid SVC. Endotracheal tube has been removed. Transesophageal tube tip terminates below the margin of imaging with the side port near the GE junction. Consider advancing 2-3 cm for optimal function. Pacer pack overlies left chest wall with midline epicardial pacer/defibrillator lead terminating in similar position to prior. Improving lung volumes with some persistent bibasilar hazy and patchy opacities in the mid to lower lungs. No visible pneumothorax or sizable pleural effusion. Stable cardiomegaly. Remaining cardiomediastinal contours are unremarkable. No acute osseous or soft tissue abnormality. Degenerative changes in the spine and left shoulder. IMPRESSION: 1. Improving lung volumes with some persistent hazy and patchy opacities in the mid to lower lungs. 2. Transesophageal tube tip terminates below the margin of imaging with the side port near the GE junction. Consider advancing 2-3 cm for optimal function. 3. Removal of the previously seen endotracheal tube. 4. Right IJ line terminates in the mid SVC. Electronically Signed   By: Lovena Le M.D.   On: 06/06/2020 01:19   DG Abd Portable 1V  Result Date: 06/05/2020 CLINICAL DATA:  NG tube placement EXAM: PORTABLE ABDOMEN - 1 VIEW COMPARISON:  None. FINDINGS: NG tube is in the stomach.  Nonobstructive bowel gas pattern. IMPRESSION: NG tube in the stomach. Electronically Signed   By: Rolm Baptise M.D.   On: 06/05/2020 21:07   EEG adult  Result Date: 06/05/2020 Lora Havens, MD     06/05/2020  9:43 AM Patient Name: Brad Mendoza MRN: [id number] Epilepsy Attending: Lora Havens Referring Physician/Provider: Date:  06/04/2020 Duration: 29.51 mins Patient history: 65 yo M with persistent encephalopathy after admission with hyperosmolar state as well as cardiac arrest. EEG to evaluate for seizure Level of alertness:  lethargic AEDs during EEG study: None Technical aspects: This EEG study was done with scalp electrodes positioned according to the 10-20 International system of electrode placement. Electrical activity was acquired at a sampling rate of _0  and reviewed with a high frequency filter of _1  and a low frequency filter of _2 . EEG data were recorded continuously and digitally stored. Description: EEG showed continuous generalized predominantly 2 to 3 Hz delta as well as 5 to 6 Hz theta slowing. Hyperventilation and photic stimulation were not performed.   ABNORMALITY - Continuous slow, generalized IMPRESSION: This study is suggestive of /severe diffuse encephalopathy, nonspecific etiology. No seizures or epileptiform discharges were seen throughout the recording. [place]: BMET Recent Labs  Lab 06/02/20 0118 06/03/20 0054 06/03/20 0957 06/03/20 1038 06/04/20 0424 06/05/20 0322 06/05/20 2308 06/06/20 0107 06/07/20 0210 06/07/20 0228  NA 137 139 140  --  141 143 147* 148* 148* 148*  K 4.4 5.3* 5.1  --  4.6 4.4 3.8 4.0 3.7 3.8  CL 106 106 107  --  106 109  --  112*  --  113*  CO2 _0 --  25 24  --  22  --  23  GLUCOSE 220* 163* 255*  --  105* 134*  --  94  --  129*  BUN 72* 95* 100*  --  124* 138*  --  128*  --  150*  CREATININE 3.19* 3.84* 3.70*  --  3.86* 3.74*  --  3.44*  --  3.74*  CALCIUM 8.0* 8.1* 8.1*  --  8.0* 8.1*  --  8.7*  --  8.4*  PHOS  --   --   --  5.8*  --  5.1*  --  3.8  --  3.8   CBC Recent Labs  Lab 06/04/20 0424 06/05/20 0322 06/05/20 2308 06/06/20 0107 06/07/20 0210 06/07/20 0228  WBC 9.1 8.1  --  13.4*  --  10.8*  NEUTROABS  --  5.9  --  11.6*  --   --   HGB 7.2* 7.1* 8.2* 7.9* 6.5* 7.0*  HCT 23.5* 23.1* 24.0* 25.4* 19.0* 22.5*  MCV 84.2  84.6  --  82.7  --  82.4  PLT 140* 169  --  255  --  245    Medications:    . sodium chloride   Intravenous Once  . aspirin  81 mg Per Tube Daily  . budesonide (PULMICORT) nebulizer solution  0.5 mg Nebulization BID  . chlorhexidine gluconate (MEDLINE KIT)  15 mL Mouth Rinse BID  . Chlorhexidine Gluconate Cloth  6 each Topical Daily  . Chlorhexidine Gluconate Cloth  6 each Topical Q0600  . enoxaparin (LOVENOX) injection  30 mg Subcutaneous Q24H  . feeding supplement (PROSource TF)  45 mL Per Tube TID  . feeding supplement (VITAL HIGH PROTEIN)  1,000 mL Per Tube Q24H  . free water  300 mL Per Tube Q4H  . insulin aspart  0-20 Units Subcutaneous Q4H  . insulin glargine  12 Units Subcutaneous BID  . ipratropium-albuterol  3 mL Nebulization Q6H  . mouth rinse  15 mL Mouth Rinse q12n4p  . metoprolol tartrate  50 mg Per Tube Q8H  . pantoprazole sodium  40 mg Per Tube QHS  . polyethylene glycol  17 g Per Tube Daily  . rosuvastatin  10 mg Per Tube Daily  . sodium chloride flush  10-40 mL Intracatheter Q12H      Reesa Chew  06/07/2020, 8:47 AM

## 2020-06-07 NOTE — Progress Notes (Signed)
Rapid Response Event Note   Reason for Call :  Called to 3W to see pt d/t increased bloody secretions   Initial Focused Assessment:  Pt alert, unable to follow commands or answer ?, RR 22,HR 80s NSR, BP 159/75, SpO2 99 on HFNC 7L. Copious bloody secretions, weak cough, difficulty clearing secretions, lungs rhonchus throughout.    Interventions:  Paged CCM ground team to bedside.  Plan of Care:   Transferred to 74m12 and reintubated   End Time: 0120  Devota Pace, RN

## 2020-06-08 DIAGNOSIS — G931 Anoxic brain damage, not elsewhere classified: Secondary | ICD-10-CM | POA: Diagnosis not present

## 2020-06-08 DIAGNOSIS — G9341 Metabolic encephalopathy: Secondary | ICD-10-CM | POA: Diagnosis not present

## 2020-06-08 DIAGNOSIS — N179 Acute kidney failure, unspecified: Secondary | ICD-10-CM | POA: Diagnosis not present

## 2020-06-08 DIAGNOSIS — I469 Cardiac arrest, cause unspecified: Secondary | ICD-10-CM | POA: Diagnosis not present

## 2020-06-08 DIAGNOSIS — I509 Heart failure, unspecified: Secondary | ICD-10-CM | POA: Diagnosis not present

## 2020-06-08 LAB — TRIGLYCERIDES: Triglycerides: 148 mg/dL (ref <150)

## 2020-06-08 LAB — CBC WITH DIFFERENTIAL/PLATELET
Abs Immature Granulocytes: 0.17 10*3/uL — ABNORMAL HIGH (ref 0.00–0.07)
Basophils Absolute: 0.1 10*3/uL (ref 0.0–0.1)
Basophils Relative: 1 %
Eosinophils Absolute: 0.4 10*3/uL (ref 0.0–0.5)
Eosinophils Relative: 3 %
HCT: 22.6 % — ABNORMAL LOW (ref 39.0–52.0)
Hemoglobin: 6.9 g/dL — CL (ref 13.0–17.0)
Immature Granulocytes: 1 %
Lymphocytes Relative: 8 %
Lymphs Abs: 1 10*3/uL (ref 0.7–4.0)
MCH: 25.9 pg — ABNORMAL LOW (ref 26.0–34.0)
MCHC: 30.5 g/dL (ref 30.0–36.0)
MCV: 85 fL (ref 80.0–100.0)
Monocytes Absolute: 1.1 10*3/uL — ABNORMAL HIGH (ref 0.1–1.0)
Monocytes Relative: 9 %
Neutro Abs: 9.4 10*3/uL — ABNORMAL HIGH (ref 1.7–7.7)
Neutrophils Relative %: 78 %
Platelets: 293 10*3/uL (ref 150–400)
RBC: 2.66 MIL/uL — ABNORMAL LOW (ref 4.22–5.81)
RDW: 16.5 % — ABNORMAL HIGH (ref 11.5–15.5)
WBC: 12.1 10*3/uL — ABNORMAL HIGH (ref 4.0–10.5)
nRBC: 0.7 % — ABNORMAL HIGH (ref 0.0–0.2)

## 2020-06-08 LAB — COOXEMETRY PANEL
Carboxyhemoglobin: 1.7 % — ABNORMAL HIGH (ref 0.5–1.5)
Methemoglobin: 0.9 % (ref 0.0–1.5)
O2 Saturation: 68.3 %
Total hemoglobin: 8 g/dL — ABNORMAL LOW (ref 12.0–16.0)

## 2020-06-08 LAB — GLUCOSE, CAPILLARY
Glucose-Capillary: 161 mg/dL — ABNORMAL HIGH (ref 70–99)
Glucose-Capillary: 187 mg/dL — ABNORMAL HIGH (ref 70–99)
Glucose-Capillary: 159 mg/dL — ABNORMAL HIGH (ref 70–99)
Glucose-Capillary: 154 mg/dL — ABNORMAL HIGH (ref 70–99)
Glucose-Capillary: 152 mg/dL — ABNORMAL HIGH (ref 70–99)
Glucose-Capillary: 145 mg/dL — ABNORMAL HIGH (ref 70–99)

## 2020-06-08 LAB — BASIC METABOLIC PANEL
Anion gap: 12 (ref 5–15)
BUN: 96 mg/dL — ABNORMAL HIGH (ref 8–23)
CO2: 25 mmol/L (ref 22–32)
Calcium: 8.3 mg/dL — ABNORMAL LOW (ref 8.9–10.3)
Chloride: 106 mmol/L (ref 98–111)
Creatinine, Ser: 2.78 mg/dL — ABNORMAL HIGH (ref 0.61–1.24)
GFR, Estimated: 25 mL/min — ABNORMAL LOW (ref >60)
Glucose, Bld: 168 mg/dL — ABNORMAL HIGH (ref 70–99)
Potassium: 3.6 mmol/L (ref 3.5–5.1)
Sodium: 143 mmol/L (ref 135–145)

## 2020-06-08 LAB — HEMOGLOBIN AND HEMATOCRIT, BLOOD
HCT: 22.7 % — ABNORMAL LOW (ref 39.0–52.0)
Hemoglobin: 7 g/dL — ABNORMAL LOW (ref 13.0–17.0)

## 2020-06-08 LAB — HEPATITIS B SURFACE ANTIBODY,QUALITATIVE: Hep B S Ab: NONREACTIVE

## 2020-06-08 LAB — HEPATITIS B CORE ANTIBODY, IGM: Hep B C IgM: NONREACTIVE

## 2020-06-08 LAB — MAGNESIUM: Magnesium: 2.5 mg/dL — ABNORMAL HIGH (ref 1.7–2.4)

## 2020-06-08 LAB — PREPARE RBC (CROSSMATCH)

## 2020-06-08 MED ORDER — HEPARIN SODIUM (PORCINE) 5000 UNIT/ML IJ SOLN
5000.0000 [IU] | Freq: Three times a day (TID) | INTRAMUSCULAR | Status: DC
  Administered 2020-06-08 – 2020-06-12 (×11): 5000 [IU] via SUBCUTANEOUS
  Filled 2020-06-08 (×11): qty 1

## 2020-06-08 MED ORDER — HEPARIN SODIUM (PORCINE) 1000 UNIT/ML IJ SOLN
INTRAMUSCULAR | Status: AC
  Filled 2020-06-08: qty 4

## 2020-06-08 MED ORDER — CHLORHEXIDINE GLUCONATE CLOTH 2 % EX PADS
6.0000 | MEDICATED_PAD | Freq: Every day | CUTANEOUS | Status: DC
  Administered 2020-06-09 – 2020-06-10 (×2): 6 via TOPICAL

## 2020-06-08 MED ORDER — SODIUM CHLORIDE 0.9% IV SOLUTION: Freq: Once | INTRAVENOUS | Status: DC

## 2020-06-08 MED ORDER — VITAL AF 1.2 CAL PO LIQD
1000.0000 mL | ORAL | Status: DC
  Administered 2020-06-08 – 2020-06-12 (×5): 1000 mL
  Filled 2020-06-08 (×6): qty 1000

## 2020-06-08 MED ORDER — HEPARIN SODIUM (PORCINE) 1000 UNIT/ML IJ SOLN
2800.0000 [IU] | Freq: Once | INTRAMUSCULAR | Status: AC
  Administered 2020-06-08: 2800 [IU]

## 2020-06-08 NOTE — Progress Notes (Signed)
eLink Physician-Brief Progress Note Patient Name: Brad Mendoza DOB: 08/04/1955 MRN: [id number]   Date of Service  06/08/2020  HPI/Events of Note  Hemoglobin 6.9 gm / dl.  eICU Interventions  One unit PRBC ordered transfused, informed consent was obtained from daughter [name].        [name] Zeev Deakins 06/08/2020, 3:04 AM

## 2020-06-08 NOTE — Progress Notes (Signed)
eLink Physician-Brief Progress Note Patient Name: Brad Mendoza DOB: 11-14-1955 MRN: [id number]   Date of Service  06/08/2020  HPI/Events of Note  Patient needs morning labs ordered.  eICU Interventions  Morning labs ordered.        [name] Fletcher Ostermiller 06/08/2020, 2:14 AM

## 2020-06-08 NOTE — Progress Notes (Signed)
Subjective/interval events: Received 1 uPRBC for Hgb 6.9 Febrile to 100.8 (38.2), completed 5 day course of ceftriaxone 2 g daily on 4/17, now on Unasyn 3 g q12hr for aspiration PNA  Exam: Current vital signs: BP (!) 144/64   Pulse 78   Temp 99.6 F (37.6 C) (Oral)   Resp (!) 24   Ht 5\' 9"  (1.753 m)   Wt 90.5 kg   SpO2 100%   BMI 29.46 kg/m  Vital signs in last 24 hours: Temp:  [99.3 F (37.4 C)-100.8 F (38.2 C)] 99.6 F (37.6 C) (04/18 0735) Pulse Rate:  [71-126] 78 (04/18 0728) Resp:  [16-25] 24 (04/18 0728) BP: (110-166)/(49-92) 144/64 (04/18 0715) SpO2:  [93 %-100 %] 100 % (04/18 0728) FiO2 (%):  [40 %] 40 % (04/18 0728) Weight:  [87.5 kg-90.5 kg] 90.5 kg (04/18 0411)   Gen: In bed, NAD Resp: non-labored breathing, no acute distress Abd: soft, nt Extremities: Edema increased on the right upper and lower extremities compared to the left.   Neuro: MS: Eyes open, does not fixate or track. He does not follow commands to stick out tongue or close eyes for me, though did so with some perseveration for my colleague on 4/17. No appendicular commands.  DH:RCBUL, blinks to eyelid stimulation bilaterally and equally.  Does not blink to threat Motor: withdraws to noxious stimulation in all four extremities Sensory:as above  Pertinent Data:  Basic Metabolic Panel: Recent Labs  Lab 06/03/20 1038 06/04/20 0424 06/05/20 0322 06/05/20 2308 06/06/20 0107 06/07/20 0210 06/07/20 0228 06/08/20 0220  NA  --  141 143 147* 148* 148* 148* 143  K  --  4.6 4.4 3.8 4.0 3.7 3.8 3.6  CL  --  106 109  --  112*  --  113* 106  CO2  --  25 24  --  22  --  23 25  GLUCOSE  --  105* 134*  --  94  --  129* 168*  BUN  --  124* 138*  --  128*  --  150* 96*  CREATININE  --  3.86* 3.74*  --  3.44*  --  3.74* 2.78*  CALCIUM  --  8.0* 8.1*  --  8.7*  --  8.4* 8.3*  MG  --   --  2.6*  --  2.7*  --  2.9* 2.5*  PHOS 5.8*  --  5.1*  --  3.8  --  3.8  --     CBC: Recent Labs  Lab  06/04/20 0424 06/05/20 0322 06/05/20 2308 06/06/20 0107 06/07/20 0210 06/07/20 0228 06/08/20 0220  WBC 9.1 8.1  --  13.4*  --  10.8* 12.1*  NEUTROABS  --  5.9  --  11.6*  --   --  9.4*  HGB 7.2* 7.1* 8.2* 7.9* 6.5* 7.0* 6.9*  HCT 23.5* 23.1* 24.0* 25.4* 19.0* 22.5* 22.6*  MCV 84.2 84.6  --  82.7  --  82.4 85.0  PLT 140* 169  --  255  --  245 293    Coagulation Studies: No results for input(s): LABPROT, INR in the last 72 hours.   EEG 4/17 - 4/18 (overnight) personally reviewed. Slowing without evidence of epileptogenic activity, please see Dr. Karolee Stamps note for full read  Duplex Upper extremities on 4/17 Right: No evidence of deep vein thrombosis in the upper extremity. Findings  consistent with acute superficial vein thrombosis involving the right basilic vein  and right cephalic vein.  Left: No evidence of deep vein  thrombosis in the upper extremity. No evidence of  superficial vein thrombosis in the upper extremity.   Pertinent prior workup UDS positive for opiates and THC Lab Results  Component Value Date   CHOL 104 06/05/2020   HDL 22 (L) 06/05/2020   LDLCALC 63 06/05/2020   LDLDIRECT 135.2 (H) 01/22/2019   TRIG 148 06/08/2020   CHOLHDL 4.7 06/05/2020   Lab Results  Component Value Date   HGBA1C 15.4 (H) 06/06/2020   MRI brain 4/14 with  4 mm area of restricted diffusion in the mid right subcortical white matter compatible with acute infarct, not seen on the recent MRI (4/11)   Impression: 65 yo M with persistent encephalopathy after admission with hyperosmolar state as well as cardiac arrest, and initial seizures felt to be provoked. The tiny stroke on MRI brain 4/14 is incidental and likely represents decompensated small vessel disease.  Though his low EF could be potentially a source of embolus, with it not being clearly an embolic stroke and the EF is not as low as would typically merit it, I do not think I would start anticoagulation from a stroke prevention  standpoint.   Notably he was extubated 4/16, but then reintubated 4/17 early AM, overnight EEG read pending, suspect this is hypoactive delirium and toxic/metabolic encephalopathy in the setting of hospitalization, infection and renal failure  Recommendations: - appreciate nephrology involvement for dialysis given c/f uremic encephalopathy - appreciate CCM team's excellent management of infectious processes - No further neurological workup indicated at this time, but please reach out if new questions or concerns arise or if patient fails to improve with continued supportive care.   Lesleigh Noe MD-PhD [facility] [phone]   If 7pm- 7am, please page neurology on call as listed in [place].  CRITICAL CARE Performed by: Lorenza Chick   Total critical care time: 37 minutes  Critical care time was exclusive of separately billable procedures and treating other patients.  Critical care was necessary to treat or prevent imminent or life-threatening deterioration.  Critical care was time spent personally by me on the following activities: development of treatment plan with patient and/or surrogate as well as nursing, discussions with consultants, evaluation of patient's response to treatment, examination of patient, obtaining history from patient or surrogate, ordering and performing treatments and interventions, ordering and review of laboratory studies, ordering and review of radiographic studies, pulse oximetry and re-evaluation of patient's condition. Recommendations relayed to Dr. Silas Flood via secure chat.

## 2020-06-08 NOTE — Progress Notes (Addendum)
Advanced Heart Failure Rounding Note  PCP-Cardiologist: Rozann Lesches, MD  [facility]: Dr. Haroldine Laws  Subjective:    Remains intubated. No responding to commands. EEG in process.    CRRT initiated 4/17 for uremia. BUN 150>>96  Scr trending down, 3.86>>3.67>>3.74>>2.78. Nonoliguric -1.6L in UOP yesterday.   Febrile overnight, mTemp 100.8. WBC 10>>12K.  On abx. Tracheal aspirate w/ few acinetobacter calcoacetius/ baumannii complex    Hgb 6.9  Echo LVEF 20-25% (down from 30-35%), RV mod reduced    Objective:   Weight Range: 91 kg Body mass index is 29.63 kg/m.   Vital Signs:   Temp:  [99.3 F (37.4 C)-100.8 F (38.2 C)] 99.5 F (37.5 C) (04/18 1108) Pulse Rate:  [64-126] 80 (04/18 1128) Resp:  [15-25] 18 (04/18 1128) BP: (110-166)/(45-92) 137/72 (04/18 1113) SpO2:  [94 %-100 %] 100 % (04/18 1128) FiO2 (%):  [40 %] 40 % (04/18 1044) Weight:  [87.5 kg-91 kg] 91 kg (04/18 1100) Last BM Date: 06/07/20  Weight change: Filed Weights   06/07/20 2240 06/08/20 0411 06/08/20 1100  Weight: 87.5 kg 90.5 kg 91 kg    Intake/Output:   Intake/Output Summary (Last 24 hours) at 06/08/2020 1132 Last data filed at 06/08/2020 1000 Gross per 24 hour  Intake 2358.63 ml  Output 1490 ml  Net 868.63 ml      Physical Exam    General:  Intubated, not responding to commands  HEENT: Normal + ETT, EEG electrodes  Neck: Supple. JVP not well visualized, + left subclavian HD cat Carotids 2+ bilat; no bruits. No lymphadenopathy or thyromegaly appreciated. Cor: PMI nondisplaced. Regular rate & rhythm. No rubs, gallops or murmurs. Lungs: intubated and clear  Abdomen: Soft, nontender, nondistended. No hepatosplenomegaly. No bruits or masses. Good bowel sounds. Extremities: No cyanosis, clubbing, rash, edema Neuro: intubated. Not responding to commands, grimaces to painful stimuli    Telemetry   NSR 70s   EKG    No new EKG to review   Labs    CBC Recent Labs    06/06/20 0107  06/07/20 0210 06/07/20 0228 06/08/20 0220  WBC 13.4*  --  10.8* 12.1*  NEUTROABS 11.6*  --   --  9.4*  HGB 7.9*   < > 7.0* 6.9*  HCT 25.4*   < > 22.5* 22.6*  MCV 82.7  --  82.4 85.0  PLT 255  --  245 293   < > = values in this interval not displayed.   Basic Metabolic Panel Recent Labs    06/06/20 0107 06/07/20 0210 06/07/20 0228 06/08/20 0220  NA 148*   < > 148* 143  K 4.0   < > 3.8 3.6  CL 112*  --  113* 106  CO2 22  --  23 25  GLUCOSE 94  --  129* 168*  BUN 128*  --  150* 96*  CREATININE 3.44*  --  3.74* 2.78*  CALCIUM 8.7*  --  8.4* 8.3*  MG 2.7*  --  2.9* 2.5*  PHOS 3.8  --  3.8  --    < > = values in this interval not displayed.   Liver Function Tests Recent Labs    06/06/20 0107 06/07/20 0228  AST 53* 48*  ALT 50* 44  ALKPHOS 254* 215*  BILITOT 0.5 0.4  PROT 5.9* 5.5*  ALBUMIN 2.1* 1.9*   No results for input(s): LIPASE, AMYLASE in the last 72 hours. Cardiac Enzymes No results for input(s): CKTOTAL, CKMB, CKMBINDEX, TROPONINI in the last 72  hours.  BNP: BNP (last 3 results) Recent Labs    02/01/20 1848 02/06/20 0631  0333  BNP 472.0* 194.0* 1,060.0*    ProBNP (last 3 results) No results for input(s): PROBNP in the last 8760 hours.   D-Dimer No results for input(s): DDIMER in the last 72 hours. Hemoglobin A1C No results for input(s): HGBA1C in the last 72 hours. Fasting Lipid Panel Recent Labs    06/08/20 0220  TRIG 148   Thyroid Function Tests No results for input(s): TSH, T4TOTAL, T3FREE, THYROIDAB in the last 72 hours.  Invalid input(s): FREET3  Other results:   Imaging    DG CHEST PORT 1 VIEW  Result Date: 06/07/2020 CLINICAL DATA:  Central line placement. EXAM: PORTABLE CHEST 1 VIEW COMPARISON:  06/07/2020 FINDINGS: Interval placement of LEFT subclavian line, tip overlying the level of superior vena cava. Endotracheal tube has been repositioned, tip approximately 3.8 centimeters above the carina. Nasogastric tube  is in place, tip beyond the image and side port in the region the gastroesophageal junction. Unchanged position of defibrillator. Stable cardiomegaly. Minimal subsegmental atelectasis in the LEFT lung. There is no pneumothorax following line placement. IMPRESSION: Interval placement of LEFT subclavian line. No pneumothorax. Electronically Signed   By: Nolon Nations M.D.   On: 06/07/2020 13:08   Overnight EEG with video  Result Date: 06/08/2020 Lora Havens, MD     06/08/2020 10:01 AM Patient Name: Brad Mendoza MRN: [id number] Epilepsy Attending: Lora Havens Referring Physician/Provider: Dr Roland Rack Duration: 06/07/2020 1617 to 06/08/2020 1000  Patient history: 65 yo M withpersistent encephalopathy after admission with hyperosmolar state as well as cardiac arrest. EEG to evaluate for seizure  Level of alertness:  awake, asleep  AEDs during EEG study: None  Technical aspects: This EEG study was done with scalp electrodes positioned according to the 10-20 International system of electrode placement. Electrical activity was acquired at a sampling rate of 500Hz  and reviewed with a high frequency filter of 70Hz  and a low frequency filter of 1Hz . EEG data were recorded continuously and digitally stored.  Description: During awake state, no clear posterior dominant rhythm was seen. Sleep was characterized by sleep spindles (12-14), maximum frontocentral region. EEG showed continuous generalized 5-6Hz  theta as well as intermittent 2-3Hz  delta slowing. Hyperventilation and photic stimulation were not performed.    ABNORMALITY - Continuous slow, generalized  IMPRESSION: This study is suggestive of moderate diffuse encephalopathy, nonspecific etiology. No seizures or epileptiform discharges were seen throughout the recording.  Priyanka [name]     Medications:     Scheduled Medications: . sodium chloride   Intravenous Once  . sodium chloride   Intravenous Once  . aspirin  81 mg Per  Tube Daily  . budesonide (PULMICORT) nebulizer solution  0.5 mg Nebulization BID  . chlorhexidine gluconate (MEDLINE KIT)  15 mL Mouth Rinse BID  . Chlorhexidine Gluconate Cloth  6 each Topical Q0600  . feeding supplement (PROSource TF)  45 mL Per Tube TID  . feeding supplement (VITAL HIGH PROTEIN)  1,000 mL Per Tube Q24H  . free water  300 mL Per Tube Q4H  . heparin injection (subcutaneous)  5,000 Units Subcutaneous Q8H  . heparin sodium (porcine)      . insulin aspart  0-20 Units Subcutaneous Q4H  . insulin glargine  12 Units Subcutaneous BID  . mouth rinse  15 mL Mouth Rinse 10 times per day  . metoprolol tartrate  50 mg Per Tube Q8H  . pantoprazole sodium  40 mg Per Tube QHS  . polyethylene glycol  17 g Per Tube Daily  . rosuvastatin  10 mg Per Tube Daily  . sodium chloride flush  10-40 mL Intracatheter Q12H    Infusions: . sodium chloride    . sodium chloride    . sodium chloride 10 mL/hr at 06/08/20 1000  . 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 Admitted with seizure and cardiac arrest.  Scr peaked to 3.9. Echo EF 20-25% (down from 30-35%), RV mod reduced hstrop 800 -> 551. Initial   Co-ox 73%  Assessment/Plan   1. Cardiac Arrest - Suspect major issues are non-cardiac despite drop in EF from 30-35% -> 20-25%. Initial co-ox ok   hs trop 801>>700>>551. Suspect mostly demand ischemia and not ACS. - no plans for cath currently given AKI  - ? If cause by seizure   2.  Acute Hypoxemic Respiratory Failure - Remains intubated.  - abx for suspected aspiration PNA  - vent management per PCCM   3. Nonoliguric AKI - Creatinine baseline 2.8-3.2  - peaked to 3.9 this admit, post arrest  - CRRT started 4/14 for uremia - BUN 150>>96. SCr down to 2.78  - Nephrology following  4. Acute Encephalopathy  - Possible metabolic abnormalities.  - MRI /MRA  4/11 no acute findings.-->4/14 MRI with small acute infact mid right frontal subcortical.  - Neurology following  - Continuous EEG suggestive of moderate diffuse encephalopathy, nonspecific etiology. No seizures or epileptiform discharges  - C/w CRRT to correct uremia   5. Seizure - Neuro following - EEG suggestive of moderate diffuse encephalopathy, nonspecific etiology. No seizures or epileptiform discharges were seen throughout the recording - MRI with small acute infact mid right frontal subcortical.  - management per neuro    6. IDDM  - Uncontrolled  - Hgb A1C 15.  - insulin ordered per primary team   7. Chronic HFrEF  - Mixed NICM/ICM . Echo this admit EF 20-25%, down from previous 30-35% in 01/2020.  - d/w RN. Will check CVP after completion of HD session to better assess volume status.  - check co-ox   8. CAD - S/p DES to proximal RCA in 1/20. - hs trop 801>>700>>551, suspect demand ischemia - no cath given AKI   9. Anemia - Hgb 6.9 - 1 unit RBCs given    Length of Stay: [address], PA-C  06/08/2020, 11:32 AM  Advanced Heart Failure Team Pager [phone] (M-F; 7a - 5p)  Please contact [facility] for night-coverage after hours (5p -7a ) and weekends on [email or web address]   Agree with above.  Remains intubated. Will respond to pain but will not follow commands. Now on HD. Remains febrile.   General: Awake on vent but not following commands HEENT: normal Neck: supple. JVP up  Carotids 2+ bilat; no bruits. No lymphadenopathy or thryomegaly appreciated. Cor: PMI nondisplaced.  Regular rate & rhythm. No rubs, gallops or murmurs. [facility] trialysis cath Lungs: clear Abdomen: soft, nontender, nondistended. No hepatosplenomegaly. No bruits or masses. Good bowel sounds. Extremities: no cyanosis, clubbing, rash, 1+ edema Neuro: awake/. Responds to pain. But  not following commands  He remains encephalopathic. Hopefully will improve with HD. Volume status up. Pulling 1L with HD today. Will likely need more.   I remain unclear on what initial insult was (seizure vs cardiac arrest). Continue supportive care. Watch rhythm on tele.   CRITICAL CARE Performed by: Glori Bickers  Total critical care time: 35 minutes  Critical care time was exclusive of separately billable procedures and treating other patients.  Critical care was necessary to treat or prevent imminent or life-threatening deterioration.  Critical care was time spent personally by me (independent of midlevel providers or residents) on the following activities: development of treatment plan with patient and/or surrogate as well as nursing, discussions with consultants, evaluation of patient's response to treatment, examination of patient, obtaining history from patient or surrogate, ordering and performing treatments and interventions, ordering and review of laboratory studies, ordering and review of radiographic studies, pulse oximetry and re-evaluation of patient's condition.  Glori Bickers, MD  2:22 PM

## 2020-06-08 NOTE — Procedures (Addendum)
Patient Name: Brad Mendoza  MRN: [id number]  Epilepsy Attending: Lora Havens  Referring Physician/Provider: Dr Roland Rack Duration: 06/07/2020 1617 to 06/08/2020 1617  Patient history: 65 yo M withpersistent encephalopathy after admission with hyperosmolar state as well as cardiac arrest. EEG to evaluate for seizure  Level of alertness:  awake, asleep  AEDs during EEG study: None  Technical aspects: This EEG study was done with scalp electrodes positioned according to the 10-20 International system of electrode placement. Electrical activity was acquired at a sampling rate of 500Hz  and reviewed with a high frequency filter of 70Hz  and a low frequency filter of 1Hz . EEG data were recorded continuously and digitally stored.   Description: During awake state, no clear posterior dominant rhythm was seen. Sleep was characterized by sleep spindles (12-14), maximum frontocentral region. EEG showed continuous generalized 5-6Hz  theta as well as intermittent 2-3Hz  delta slowing. Hyperventilation and photic stimulation were not performed.     ABNORMALITY - Continuous slow, generalized  IMPRESSION: This study is suggestive of moderate diffuse encephalopathy, nonspecific etiology. No seizures or epileptiform discharges were seen throughout the recording.  [name] [name]

## 2020-06-08 NOTE — Progress Notes (Signed)
Nutrition Follow-up  DOCUMENTATION CODES:   Not applicable  INTERVENTION:   Tube feeding via NG tube: Change to Vital AF 1.2 at 85 ml/h (2040 ml per day) D/C Prosource TF   Provides 2448 kcal, 153 gm protein, 1654 ml free water daily  NUTRITION DIAGNOSIS:   Inadequate oral intake related to inability to eat as evidenced by NPO status.  Ongoing   GOAL:   Patient will meet greater than or equal to 90% of their needs  Met with TF  MONITOR:   TF tolerance,Vent status  REASON FOR ASSESSMENT:   Consult Enteral/tube feeding initiation and management  ASSESSMENT:   Pt admitted after having seizure like activity at home and slurred speech. Initially taken to [facility] and was emergently intubated in ED after undergoing CPR. Transferred to [facility] for MRI capabilities. PMH relevant for CKD4, GERD, ICD in place, T2DM, HTN, CAD, HLD, diverticulosis, and polysubstance abuse.  Discussed patient in ICU rounds and with RN today. NG tube in place. Tolerating TF well. Vital High Protein at 55 ml/h with Prosource TF 45 ml TID. Free water flushes 300 ml every 4 hours.  Hx CKD IV. Receiving HD in room today.   Patient is currently intubated on ventilator support MV: 20.1 L/min Temp (24hrs), Avg:99.9 F (37.7 C), Min:99.3 F (37.4 C), Max:100.8 F (38.2 C)  Labs reviewed. Mag 2.5 CBG: [phone]  Medications reviewed and include novolog SSI, lantus.   I/O +2.2 L since admission UOP 1640 ml x 24 h   NUTRITION - FOCUSED PHYSICAL EXAM:  Flowsheet Row Most Recent Value  Orbital Region Moderate depletion  Upper Arm Region Unable to assess  Thoracic and Lumbar Region Unable to assess  Buccal Region Unable to assess  Temple Region Moderate depletion  Clavicle Bone Region Mild depletion  Clavicle and Acromion Bone Region Mild depletion  Scapular Bone Region Unable to assess  Dorsal Hand Unable to assess  Patellar Region Mild depletion  Anterior Thigh Region Mild  depletion  Posterior Calf Region Moderate depletion  Edema (RD Assessment) Moderate  Hair Reviewed  Eyes Unable to assess  Mouth Unable to assess  Skin Reviewed  Nails Reviewed       Diet Order:   Diet Order            Diet NPO time specified  Diet effective now                 EDUCATION NEEDS:   Not appropriate for education at this time  Skin:  Skin Assessment: Skin Integrity Issues: Skin Integrity Issues:: Other (Comment) Other: RUE wound, skin tears to wrist and arm  Last BM:  4/17 rectal tube  Height:   Ht Readings from Last 1 Encounters:  06/11/2020 5' 9" (1.753 m)    Weight:   Wt Readings from Last 1 Encounters:  06/08/20 91 kg    Ideal Body Weight:  72.7 kg  BMI:  Body mass index is 29.63 kg/m.  Estimated Nutritional Needs:   Kcal:  6378  Protein:  140-165 gm  Fluid:  1 L + UOP    Lucas Mallow, RD, LDN, CNSC Please refer to Amion for contact information.

## 2020-06-08 NOTE — Progress Notes (Signed)
NAME:  Brad Mendoza, MRN:  [id number], DOB:  1955-09-01, LOS: 8 ADMISSION DATE:  05/30/2020, CONSULTATION DATE:  4/10  REFERRING MD:  Dr. Otelia Limes, CHIEF COMPLAINT:  Cardiac Arrest    History of Present Illness:  Patient is intubated and sedated; therefore history has been obtained from chart review.  Brad Mendoza is a 65 y.o. 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 Patient presented to the [facility] ED on 4/9 with complaints of seizure-like activity and decreased level of consciousness: last normal 1630 with family finding him w/ slurred speech at 1700. As the patient was being transferred into the ambulance he developed agonal respirations and suffered a generalized tonic-clonic seizure. Seizure aborted w/ Versed but patient did wake up from his postictal phase and was bagged in route to the ED. Upon arrival to the ED he was found to be pulseless and underwent CPR. ROSC after 3-4 rounds, non-shockable rhythm - Epi x3, amiodarone, calcium. Pt had copies projectile emesis after ROSC; agonal respiration; OG dropped before, suctioned, then uncomplicated intubation Non-sustained VT afterwards, followed by sinus tach. Initial BPs with systolic in the 347Q. Head CT at OSH showed no acute processes. Telemetry neurology was involved and recommended a CTA; however, was not preformed 2/2 CKD/elevated serum creatinine. [facility] did not have MRI capabilities and the patient was referred for transfer to [facility]. MRI on 4/11 did not indicate acute ischemic or hemorrhagic process and was not c/w anoxic brain injury.    Pertinent  Medical History  CAD s/p DES RCA, HFrEF s/p ICD CKD IV HTN, HLD, T2DM GERD Chronic back pain Fracture right lower extremity occurred on 04/17/2020  Significant Hospital Events: Including  procedures, antibiotic start and stop dates in addition to other pertinent events    4/9 Sz, cardiac arrest  4/10 tx to [facility] for ICU care  4/10 nonspecific EEG  4/11 largely unremarkable MRI/MRA 06/03/2020 right internal jugular hemodialysis catheter placed without difficulty>> 06/04/2020 4 mm area of restricted diffusion in the mid right subcortical white matter compatible with acute infarct, not seen on the recent MRI 4/15: Extubated 4/16: Patient was transferred to progressive care, overnight he had lots of secretions, unable to clear, light suctioning patient was noted to be bleeding from upper airway, became unresponsive, and hyperresponsiveness called and transferred to ICU, intubated 4/17 remained intubated, HD line placed, iHD with 500 cc UF 4/18 Remains encephalopathic, iHD again today?   Interim History / Subjective:  NAEON. On cVEEG, slow diffuse. No seizures.   Objective   Blood pressure (!) (P) 165/80, pulse 80, temperature 99.5 F (37.5 C), temperature source Oral, resp. rate 18, height 5\' 9"  (1.753 m), weight 91 kg, SpO2 100 %.    Vent Mode: PRVC FiO2 (%):  [40 %] 40 % Set Rate:  [16 bmp] 16 bmp Vt Set:  [560 mL] 560 mL PEEP:  [5 cmH20] 5 cmH20 Pressure Support:  [10 cmH20] 10 cmH20 Plateau Pressure:  [13 cmH20-18 cmH20] 15 cmH20   Intake/Output Summary (Last 24 hours) at 06/08/2020 1205 Last data filed at 06/08/2020 1000 Gross per 24 hour  Intake 2355.46 ml  Output 1490 ml  Net 865.46 ml   Filed Weights   06/07/20 2240 06/08/20 0411 06/08/20 1100  Weight: 87.5 kg 90.5 kg 91 kg    Examination: General: Chronically  ill-appearing male, lying on the bed, orally intubated HEENT: Atraumatic, normocephalic, no JVD, pupils are equal round reactive to light, moist mucous membranes Neuro: Opens eyes with vocal stimuli, not following commands. Minimal movement in bilateral upper extremities, withdrawing in bilateral lower extremities CV: Regular rate and rhythm,  no murmur PULM: Diminished in bilateral bases, no wheezes or rhonchi GI: soft, bsx4 active tube feedings at goal Extremities: warm/dry,  edema right short leg cast in place good capillary refill in her right toes Skin: no rashes or lesions  Labs/imaging that I havepersonally reviewed  (right click and "Reselect all SmartList Selections" daily)  Sodium 143, serum creatinine 3.4, BUN 96  Resolved Hospital Problem list     Assessment & Plan:   S/p PEA Cardiac Arrest in the setting of hypoxia and seizure Nonsustained ventricular tachycardia Known history of coronary disease and chronic HFrEF increases his risk for sudden cardiac arrest. Cardiac arrest likely 2/2 to seizure versus hypoxia MRI brain did not show signs of anoxic injury Continue telemetry monitoring - no seizures  Acute hypoxemic respiratory failure Patient was intubated, 4/16 due to unable to clear secretions and protect his airway He was found to be hypoxic and unresponsive. Received multiple doses of Robinul  Continue lung protective ventilation, currently tolerating pressure support but mental status remain poor. --PRVC, PSV trials --VAP protocol  Right lower extremity trimalleolar fracture status post Closed reduction 04/17/20. Dr. Arther Abbott orthopedics last examined 04/22/20 [facility] in [place]. Orthopedic follow-up is appreciated Lower extremity cast was removed  Paroxysmal A. fib with RVR Patient converted back to sinus rhythm with controlled rate Continue metoprolol  No AC as hgb low requiring intermittent transfusion  Right lower lobe pneumonia actinobacter Respiratory culture grew actinobacter, pansensitive Ceftriaxone switched to Unasyn 4/17  AKI on CKD IV Patient baseline serum creatinine is around 3 HD catheter placed 4/17, iHD 4/17 and planned 4/33  Acute metabolic encephalopathy Patient mental status not back to baseline his BUN is elevated, that could be contributing to his  mental status MRI did not show signs of anoxic injury Avoid sedation Continue dialysis and see if improves   Micro-normocytic Anemia  Thrombocytopenia  Decreased Hemoglobin  Transfuse per protocol. No overt source of bleeding Continue ppx heparin for now  Seizure preceded by acute encephalopathy  Pre-cardiac arrest. Likely provoked seizure 2/2 uncontrolled hyperglycemia. UA negative for ketones. No significant PTA ETOH use. UDS +TCH. 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 LVEF 30% at time of ICD placement in 05/2019.[name] Repeat 2 d echo 4/14 ef 30% CAD s/p DES to RCA 02/2020  Best practice (right click and "Reselect all SmartList Selections" daily)  Diet:  Tube Feed  Pain/Anxiety/Delirium protocol (if indicated): No VAP protocol (if indicated): N/A DVT prophylaxis: LMWH GI prophylaxis: PPI Glucose control:  SSI Yes and Basal insulin Yes Central venous access: Yes, discontinue Arterial line:  N/A Foley:  Yes, and it is no longer needed try condom cath Mobility:  bed rest  PT consulted: N/A Last date of multidisciplinary goals of care discussion [4/17 patient's daughter and ex-wife was called and updated over the phone] Code Status:  full code Disposition: ICU   CRITICAL CARE Performed by: Bonna Gains Der Gagliano   Total critical care time: 40 minutes  Critical care time was exclusive of separately billable procedures and treating  other patients.  Critical care was necessary to treat or prevent imminent or life-threatening deterioration.  Critical care was time spent personally by me on the following activities: development of treatment plan with patient and/or surrogate as well as nursing, discussions with consultants, evaluation of patient's response to  treatment, examination of patient, obtaining history from patient or surrogate, ordering and performing treatments and interventions, ordering and review of laboratory studies, ordering and review of radiographic studies, pulse oximetry and re-evaluation of patient's condition.

## 2020-06-08 NOTE — Progress Notes (Addendum)
[facility] ROUNDING NOTE   Subjective:   Brief history: This is a 65-year-old gentleman with a history of coronary artery disease status post DES stent to RCA.  Diastolic dysfunction.  Status post ICD.  History of diabetes hypertension polysubstance abuse.  CKD stage IIIb/IV with baseline creatinine 01/2020 2.6 to 3 mg/dL.  Recent right lower extremity fracture we will try to find.  Presented with altered mental status.  In ED had a pulseless PEA arrest requiring CPR and ROSC after 3-4 rounds of epinephrine.  Required intubation for airway protection.  Urine drug screen positive for THC.  Underwent dialysis 06/07/2020 with removal of 500 cc.  Nonoliguric with urine output 1.7 L.  Blood pressure 144/64 pulse 74 temperature 99.6.  O2 sats 100% FiO2 40%  Sodium 143 potassium 3.6 chloride 106 CO2 25 BUN 96 creatinine 2.78 glucose 168 calcium 8.3 magnesium 2.5 hemoglobin 6.9       Objective:  Vital signs in last 24 hours:  Temp:  [99 F (37.2 C)-100.8 F (38.2 C)] 99.6 F (37.6 C) (04/18 0735) Pulse Rate:  [71-126] 78 (04/18 0728) Resp:  [16-25] 24 (04/18 0728) BP: (110-178)/(49-92) 144/64 (04/18 0715) SpO2:  [93 %-100 %] 100 % (04/18 0728) FiO2 (%):  [40 %] 40 % (04/18 0728) Weight:  [87.5 kg-90.5 kg] 90.5 kg (04/18 0411)  Weight change: -1.6 kg Filed Weights   06/07/20 2030 06/07/20 2240 06/08/20 0411  Weight: 88.3 kg 87.5 kg 90.5 kg    Intake/Output: I/O last 3 completed shifts: In: 2353.8 [I.V.:375.4; NG/GT:1778.3; IV Piggyback:200.1] Out: 2865 [Urine:2140; Other:500; Stool:225]   Intake/Output this shift:  Total I/O In: 325 [Blood:325] Out: -   General: NAD,intubated HEENT: ETT, moist mucous membranes Lungs: Bilateral chest rise with no increased work of breathing Cardiovascular:RRR,no JVD Abdomen: soft, non-distended Extremities: tredema noted in left lower extremity, cast on right lower extremity Neuro: Minimally interactive today   Basic  Metabolic Panel: Recent Labs  Lab 06/03/20 1038 06/04/20 0424 06/05/20 0322 06/05/20 2308 06/06/20 0107 06/07/20 0210 06/07/20 0228 06/08/20 0220  NA  --  141 143 147* 148* 148* 148* 143  K  --  4.6 4.4 3.8 4.0 3.7 3.8 3.6  CL  --  106 109  --  112*  --  113* 106  CO2  --  25 24  --  22  --  23 25  GLUCOSE  --  105* 134*  --  94  --  129* 168*  BUN  --  124* 138*  --  128*  --  150* 96*  CREATININE  --  3.86* 3.74*  --  3.44*  --  3.74* 2.78*  CALCIUM  --  8.0* 8.1*  --  8.7*  --  8.4* 8.3*  MG  --   --  2.6*  --  2.7*  --  2.9* 2.5*  PHOS 5.8*  --  5.1*  --  3.8  --  3.8  --     Liver Function Tests: Recent Labs  Lab 06/03/20 0054 06/04/20 0424 06/05/20 0322 06/06/20 0107 06/07/20 0228  AST 22 51* 62* 53* 48*  ALT 37 39 51* 50* 44  ALKPHOS 118 158* 208* 254* 215*  BILITOT 0.7 0.6 0.5 0.5 0.4  PROT 5.0* 4.8* 4.9* 5.9* 5.5*  ALBUMIN 1.6* 1.4* 1.4* 2.1* 1.9*   No results for input(s): LIPASE, AMYLASE in the last 168 hours. No results for input(s): AMMONIA in the last 168 hours.  CBC: Recent Labs  Lab 06/04/20 0424   06/05/20 0322 06/05/20 2308 06/06/20 0107 06/07/20 0210 06/07/20 0228 06/08/20 0220  WBC 9.1 8.1  --  13.4*  --  10.8* 12.1*  NEUTROABS  --  5.9  --  11.6*  --   --  9.4*  HGB 7.2* 7.1* 8.2* 7.9* 6.5* 7.0* 6.9*  HCT 23.5* 23.1* 24.0* 25.4* 19.0* 22.5* 22.6*  MCV 84.2 84.6  --  82.7  --  82.4 85.0  PLT 140* 169  --  255  --  245 293    Cardiac Enzymes: Recent Labs  Lab 06/03/20 1825  CKTOTAL 175    BNP: Invalid input(s): POCBNP  CBG: Recent Labs  Lab 06/07/20 1506 06/07/20 1921 06/07/20 2318 06/08/20 0311 06/08/20 0733  GLUCAP 109* 134* 143* 161* 187*    Microbiology: Results for orders placed or performed during the hospital encounter of 06/19/2020  MRSA PCR Screening     Status: Abnormal   Collection Time: 05/27/2020  1:52 AM   Specimen: Nasal Mucosa; Nasopharyngeal  Result Value Ref Range Status   MRSA by PCR (A) NEGATIVE  Final    INVALID, UNABLE TO DETERMINE THE PRESENCE OF TARGET DUE TO SPECIMEN INTEGRITY. RECOLLECTION REQUESTED.    Comment: NOTIFIED SIERRA M RN @0946 05/24/2020 EB Performed at [facility], [address] [place], [place] [address]   Culture, respiratory (tracheal aspirate)     Status: None   Collection Time: 06/04/2020  3:30 AM   Specimen: Tracheal Aspirate; Respiratory  Result Value Ref Range Status   Specimen Description TRACHEAL ASPIRATE  Final   Special Requests Normal  Final   Gram Stain   Final    RARE WBC PRESENT, PREDOMINANTLY PMN MODERATE GRAM POSITIVE COCCI IN CHAINS    Culture   Final    ABUNDANT Normal respiratory flora-no Staph aureus or Pseudomonas seen Performed at [facility], [address] [place], [place] [address]    Report Status 06/02/2020 FINAL  Final  Culture, blood (routine x 2)     Status: None   Collection Time: 06/16/2020  3:33 AM   Specimen: BLOOD  Result Value Ref Range Status   Specimen Description BLOOD RIGHT ARM  Final   Special Requests   Final    BOTTLES DRAWN AEROBIC ONLY Blood Culture adequate volume   Culture   Final    NO GROWTH 5 DAYS Performed at [facility], [address] [place], [place] [address]    Report Status 06/05/2020 FINAL  Final  Culture, blood (routine x 2)     Status: None   Collection Time: 06/11/2020  3:50 AM   Specimen: BLOOD  Result Value Ref Range Status   Specimen Description BLOOD RIGHT ARM  Final   Special Requests   Final    BOTTLES DRAWN AEROBIC ONLY Blood Culture adequate volume   Culture   Final    NO GROWTH 5 DAYS Performed at [facility], [address] [place], [place] [address]    Report Status 06/05/2020 FINAL  Final  MRSA PCR Screening     Status: None   Collection Time: 06/08/2020 12:07 PM   Specimen: Nasal Mucosa; Nasopharyngeal  Result Value Ref Range Status   MRSA by PCR NEGATIVE NEGATIVE Final    Comment:        The GeneXpert MRSA Assay (FDA approved for NASAL  specimens only), is one component of a comprehensive MRSA colonization surveillance program. It is not intended to diagnose MRSA infection nor to guide or   monitor treatment for MRSA infections. Performed at [facility], [address] [place], [place] [address]   Culture, Respiratory w Gram Stain     Status: None   Collection Time: 06/05/20  4:04 PM   Specimen: Tracheal Aspirate; Respiratory  Result Value Ref Range Status   Specimen Description TRACHEAL ASPIRATE  Final   Special Requests NONE  Final   Gram Stain   Final    NO WBC SEEN RARE GRAM NEGATIVE RODS Performed at [facility], [address] [place],  [address]    Culture FEW ACINETOBACTER CALCOACETICUS/BAUMANNII COMPLEX  Final   Report Status 06/07/2020 FINAL  Final   Organism ID, Bacteria ACINETOBACTER CALCOACETICUS/BAUMANNII COMPLEX  Final      Susceptibility   Acinetobacter calcoaceticus/baumannii complex - MIC*    CEFTAZIDIME 4 SENSITIVE Sensitive     CIPROFLOXACIN <=0.25 SENSITIVE Sensitive     GENTAMICIN <=1 SENSITIVE Sensitive     IMIPENEM <=0.25 SENSITIVE Sensitive     PIP/TAZO <=4 SENSITIVE Sensitive     TRIMETH/SULFA <=20 SENSITIVE Sensitive     AMPICILLIN/SULBACTAM <=2 SENSITIVE Sensitive     * FEW ACINETOBACTER CALCOACETICUS/BAUMANNII COMPLEX    Coagulation Studies: No results for input(s): LABPROT, INR in the last 72 hours.  Urinalysis: No results for input(s): COLORURINE, LABSPEC, PHURINE, GLUCOSEU, HGBUR, BILIRUBINUR, KETONESUR, PROTEINUR, UROBILINOGEN, NITRITE, LEUKOCYTESUR in the last 72 hours.  Invalid input(s): APPERANCEUR    Imaging: DG CHEST PORT 1 VIEW  Result Date: 06/07/2020 CLINICAL DATA:  Central line placement. EXAM: PORTABLE CHEST 1 VIEW COMPARISON:  06/07/2020 FINDINGS: Interval placement of LEFT subclavian line, tip overlying the level of superior vena cava. Endotracheal tube has been repositioned, tip approximately 3.8 centimeters above the carina.  Nasogastric tube is in place, tip beyond the image and side port in the region the gastroesophageal junction. Unchanged position of defibrillator. Stable cardiomegaly. Minimal subsegmental atelectasis in the LEFT lung. There is no pneumothorax following line placement. IMPRESSION: Interval placement of LEFT subclavian line. No pneumothorax. Electronically Signed   By: Elizabeth  Brown M.D.   On: 06/07/2020 13:08   Portable Chest x-ray  Result Date: 06/07/2020 CLINICAL DATA:  ET tube placement EXAM: PORTABLE CHEST 1 VIEW COMPARISON:  06/06/2020 FINDINGS: ET tube tip just into the left mainstem bronchus. Recommend retracting 3 cm. Cardiomegaly, vascular congestion. Right dialysis catheter has been removed. Defibrillator and NG tube are unchanged. IMPRESSION: Endotracheal tube tip chest into the left mainstem bronchus. Recommend retracting 3 cm. Cardiomegaly, vascular congestion. These results were called by telephone at the time of interpretation on 06/07/2020 at 1:24 am to the patient's nurse, who verbally acknowledged these results. Electronically Signed   By: Kevin  Dover M.D.   On: 06/07/2020 01:24   VAS US UPPER EXTREMITY VENOUS DUPLEX  Result Date: 06/07/2020 UPPER VENOUS STUDY  Indications: Edema Limitations: Line, bandages and ventilator. Comparison Study: No prior study Performing Technologist: Candace Kanady RVS  Examination Guidelines: A complete evaluation includes B-mode imaging, spectral Doppler, color Doppler, and power Doppler as needed of all accessible portions of each vessel. Bilateral testing is considered an integral part of a complete examination. Limited examinations for reoccurring indications may be performed as noted.  Right Findings: +----------+------------+---------+-----------+----------+--------------+ RIGHT     CompressiblePhasicitySpontaneousProperties   Summary     +----------+------------+---------+-----------+----------+--------------+ IJV                                                    Not visualized +----------+------------+---------+-----------+----------+--------------+ Subclavian               Yes       Yes                             +----------+------------+---------+-----------+----------+--------------+ Axillary      Full       Yes       Yes                             +----------+------------+---------+-----------+----------+--------------+ Brachial      Full       Yes       Yes                             +----------+------------+---------+-----------+----------+--------------+ Radial        Full                                                 +----------+------------+---------+-----------+----------+--------------+ Ulnar         Full                                                 +----------+------------+---------+-----------+----------+--------------+ Cephalic      None                                                 +----------+------------+---------+-----------+----------+--------------+ Basilic       None                                                 +----------+------------+---------+-----------+----------+--------------+  Left Findings: +----------+------------+---------+-----------+----------+--------------+ LEFT      CompressiblePhasicitySpontaneousProperties   Summary     +----------+------------+---------+-----------+----------+--------------+ IJV           Full       Yes       Yes                             +----------+------------+---------+-----------+----------+--------------+ Subclavian               Yes       Yes                             +----------+------------+---------+-----------+----------+--------------+ Axillary                 Yes       Yes                             +----------+------------+---------+-----------+----------+--------------+ Brachial                 Yes       Yes                              +----------+------------+---------+-----------+----------+--------------+   Radial        Full                                                 +----------+------------+---------+-----------+----------+--------------+ Ulnar                                               Not visualized +----------+------------+---------+-----------+----------+--------------+ Cephalic      Full                                                 +----------+------------+---------+-----------+----------+--------------+ Basilic       Full                                                 +----------+------------+---------+-----------+----------+--------------+  Summary:  Right: No evidence of deep vein thrombosis in the upper extremity. Findings consistent with acute superficial vein thrombosis involving the right basilic vein and right cephalic vein.  Left: No evidence of deep vein thrombosis in the upper extremity. No evidence of superficial vein thrombosis in the upper extremity.  *See table(s) above for measurements and observations.  Diagnosing physician: Brandon Cain MD Electronically signed by Brandon Cain MD on 06/07/2020 at 1:58:26 PM.    Final      Medications:   . sodium chloride    . sodium chloride    . sodium chloride 10 mL/hr at 06/08/20 0600  . ampicillin-sulbactam (UNASYN) IV Stopped (06/07/20 2352)   . sodium chloride   Intravenous Once  . sodium chloride   Intravenous Once  . aspirin  81 mg Per Tube Daily  . budesonide (PULMICORT) nebulizer solution  0.5 mg Nebulization BID  . chlorhexidine gluconate (MEDLINE KIT)  15 mL Mouth Rinse BID  . Chlorhexidine Gluconate Cloth  6 each Topical Daily  . enoxaparin (LOVENOX) injection  30 mg Subcutaneous Q24H  . feeding supplement (PROSource TF)  45 mL Per Tube TID  . feeding supplement (VITAL HIGH PROTEIN)  1,000 mL Per Tube Q24H  . free water  300 mL Per Tube Q4H  . insulin aspart  0-20 Units Subcutaneous Q4H  . insulin glargine  12 Units  Subcutaneous BID  . mouth rinse  15 mL Mouth Rinse 10 times per day  . metoprolol tartrate  50 mg Per Tube Q8H  . pantoprazole sodium  40 mg Per Tube QHS  . polyethylene glycol  17 g Per Tube Daily  . rosuvastatin  10 mg Per Tube Daily  . sodium chloride flush  10-40 mL Intracatheter Q12H   sodium chloride, sodium chloride, sodium chloride, acetaminophen (TYLENOL) oral liquid 160 mg/5 mL, alteplase, docusate, fentaNYL, heparin, heparin sodium (porcine), hydrALAZINE, ipratropium-albuterol, lidocaine (PF), lidocaine-prilocaine, ondansetron (ZOFRAN) IV, pentafluoroprop-tetrafluoroeth, polyethylene glycol, sodium chloride flush  Assessment/ Plan:  1. 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 He went through 3-4 rounds of CPR requiring Epi x3, amiodarone and Ca + intubation.    Renal ultrasound reassuring and urine microscopy was also okay but likely suffered ATN. -Renal parameters were improving   but now with reintubation his mental status is worsened and BUN is rising.    Dialysis initiated due to concerns of uremia contributing to altered mental state. -Underwent dialysis 06/07/2020.  Next dialysis treatment 06/08/2020.  We will continue to follow for dialysis needs. -Appreciate CCM placement of catheter - Avoid nephrotoxic agents as you are already doing and dose for GFR <20  2. HTN - controlled 3. DM 4. S/p cardiac arrest - not clear the etiology but he has a h/o HFrEF and CASHD. Neurology following 5. Hypernatremia: We will continue to follow    LOS: 8  W  @TODAY@7:37 AM  

## 2020-06-08 NOTE — Progress Notes (Signed)
EEG maintenance complete. No skin breakdown at FP1 F7 A1. Continue to monitor

## 2020-06-09 DIAGNOSIS — N179 Acute kidney failure, unspecified: Secondary | ICD-10-CM | POA: Diagnosis not present

## 2020-06-09 DIAGNOSIS — I469 Cardiac arrest, cause unspecified: Secondary | ICD-10-CM | POA: Diagnosis not present

## 2020-06-09 DIAGNOSIS — G931 Anoxic brain damage, not elsewhere classified: Secondary | ICD-10-CM | POA: Diagnosis not present

## 2020-06-09 DIAGNOSIS — I509 Heart failure, unspecified: Secondary | ICD-10-CM | POA: Diagnosis not present

## 2020-06-09 LAB — BPAM RBC
Blood Product Expiration Date: 202205172359
ISSUE DATE / TIME: 202204180446
Unit Type and Rh: 5100

## 2020-06-09 LAB — COMPREHENSIVE METABOLIC PANEL
ALT: 51 U/L — ABNORMAL HIGH (ref 0–44)
AST: 49 U/L — ABNORMAL HIGH (ref 15–41)
Albumin: 1.7 g/dL — ABNORMAL LOW (ref 3.5–5.0)
Alkaline Phosphatase: 235 U/L — ABNORMAL HIGH (ref 38–126)
Anion gap: 8 (ref 5–15)
BUN: 87 mg/dL — ABNORMAL HIGH (ref 8–23)
CO2: 27 mmol/L (ref 22–32)
Calcium: 7.8 mg/dL — ABNORMAL LOW (ref 8.9–10.3)
Chloride: 103 mmol/L (ref 98–111)
Creatinine, Ser: 3.28 mg/dL — ABNORMAL HIGH (ref 0.61–1.24)
GFR, Estimated: 20 mL/min — ABNORMAL LOW (ref >60)
Glucose, Bld: 166 mg/dL — ABNORMAL HIGH (ref 70–99)
Potassium: 3.3 mmol/L — ABNORMAL LOW (ref 3.5–5.1)
Sodium: 138 mmol/L (ref 135–145)
Total Bilirubin: 0.6 mg/dL (ref 0.3–1.2)
Total Protein: 5 g/dL — ABNORMAL LOW (ref 6.5–8.1)

## 2020-06-09 LAB — TYPE AND SCREEN
ABO/RH(D): O POS
Antibody Screen: NEGATIVE
Unit division: 0

## 2020-06-09 LAB — CBC
HCT: 23.5 % — ABNORMAL LOW (ref 39.0–52.0)
Hemoglobin: 7.1 g/dL — ABNORMAL LOW (ref 13.0–17.0)
MCH: 25.4 pg — ABNORMAL LOW (ref 26.0–34.0)
MCHC: 30.2 g/dL (ref 30.0–36.0)
MCV: 83.9 fL (ref 80.0–100.0)
Platelets: 279 10*3/uL (ref 150–400)
RBC: 2.8 MIL/uL — ABNORMAL LOW (ref 4.22–5.81)
RDW: 15.9 % — ABNORMAL HIGH (ref 11.5–15.5)
WBC: 13.9 10*3/uL — ABNORMAL HIGH (ref 4.0–10.5)
nRBC: 0.5 % — ABNORMAL HIGH (ref 0.0–0.2)

## 2020-06-09 LAB — GLUCOSE, CAPILLARY
Glucose-Capillary: 165 mg/dL — ABNORMAL HIGH (ref 70–99)
Glucose-Capillary: 181 mg/dL — ABNORMAL HIGH (ref 70–99)
Glucose-Capillary: 179 mg/dL — ABNORMAL HIGH (ref 70–99)
Glucose-Capillary: 191 mg/dL — ABNORMAL HIGH (ref 70–99)
Glucose-Capillary: 224 mg/dL — ABNORMAL HIGH (ref 70–99)
Glucose-Capillary: 197 mg/dL — ABNORMAL HIGH (ref 70–99)

## 2020-06-09 MED ORDER — FREE WATER
200.0000 mL | Status: DC
  Administered 2020-06-09 – 2020-06-10 (×6): 200 mL

## 2020-06-09 MED ORDER — POTASSIUM CHLORIDE 20 MEQ PO PACK
20.0000 meq | PACK | Freq: Once | ORAL | Status: AC
  Administered 2020-06-09: 20 meq
  Filled 2020-06-09: qty 1

## 2020-06-09 NOTE — Progress Notes (Signed)
NAME:  Brad Mendoza, MRN:  [id number], DOB:  27-Jan-1956, LOS: 9 ADMISSION DATE:  05/23/2020, CONSULTATION DATE:  4/10  REFERRING MD:  Dr. Otelia Limes, CHIEF COMPLAINT:  Cardiac Arrest    History of Present Illness:  Patient is intubated and sedated; therefore history has been obtained from chart review.  Brad Mendoza is a 65 y.o. 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 Patient presented to the [facility] ED on 4/9 with complaints of seizure-like activity and decreased level of consciousness: last normal 1630 with family finding him w/ slurred speech at 1700. As the patient was being transferred into the ambulance he developed agonal respirations and suffered a generalized tonic-clonic seizure. Seizure aborted w/ Versed but patient did wake up from his postictal phase and was bagged in route to the ED. Upon arrival to the ED he was found to be pulseless and underwent CPR. ROSC after 3-4 rounds, non-shockable rhythm - Epi x3, amiodarone, calcium. Pt had copies projectile emesis after ROSC; agonal respiration; OG dropped before, suctioned, then uncomplicated intubation Non-sustained VT afterwards, followed by sinus tach. Initial BPs with systolic in the 810F. Head CT at OSH showed no acute processes. Telemetry neurology was involved and recommended a CTA; however, was not preformed 2/2 CKD/elevated serum creatinine. [facility] did not have MRI capabilities and the patient was referred for transfer to [facility]. MRI on 4/11 did not indicate acute ischemic or hemorrhagic process and was not c/w anoxic brain injury.    Pertinent  Medical History  CAD s/p DES RCA, HFrEF s/p ICD CKD IV HTN, HLD, T2DM GERD Chronic back pain Fracture right lower extremity occurred on 04/17/2020  Significant Hospital Events: Including  procedures, antibiotic start and stop dates in addition to other pertinent events    4/9 Sz, cardiac arrest  4/10 tx to [facility] care  4/10 nonspecific EEG  4/11 largely unremarkable MRI/MRA 06/03/2020 right internal jugular hemodialysis catheter placed without difficulty>> 06/04/2020 4 mm area of restricted diffusion in the mid right subcortical white matter compatible with acute infarct, not seen on the recent MRI 4/15: Extubated 4/16: Patient was transferred to progressive care, overnight he had lots of secretions, unable to clear, light suctioning patient was noted to be bleeding from upper airway, became unresponsive, and hyperresponsiveness called and transferred to ICU, intubated 4/17 remained intubated, HD line placed, iHD with 500 cc UF 4/18 Remains encephalopathic, iHD with 1L removed 4/19 BUN better, EEG negative, diffuse slowing, Mental status   Interim History / Subjective:  NAEON. On cVEEG, slow diffuse. EEG d/c'd. Spoke with wife, poor prognosis without improvement in mental status.   Objective   Blood pressure (!) 146/84, pulse 70, temperature 99.9 F (37.7 C), temperature source Oral, resp. rate (!) 22, height 5\' 9"  (1.753 m), weight 90.3 kg, SpO2 99 %. CVP:  [9 mmHg] 9 mmHg  Vent Mode: PSV;CPAP FiO2 (%):  [40 %-50 %] 40 % Set Rate:  [16 bmp] 16 bmp Vt Set:  [560 mL] 560 mL PEEP:  [5 cmH20] 5 cmH20 Pressure Support:  [10 cmH20] 10 cmH20 Plateau Pressure:  [13 cmH20-23 cmH20] 16 cmH20   Intake/Output Summary (Last 24 hours) at 06/09/2020 1220 Last data filed at 06/09/2020 0600 Gross per 24 hour  Intake 2213.65 ml  Output 1650 ml  Net 563.65 ml  Filed Weights   06/08/20 1100 06/08/20 1335 06/09/20 0500  Weight: 91 kg 90 kg 90.3 kg    Examination: General: Chronically ill-appearing male, lying on the bed, orally intubated HEENT: Atraumatic, normocephalic, no JVD, pupils are equal round reactive to light, moist mucous membranes Neuro: Opens eyes  with vocal stimuli, not following commands. Minimal movement in bilateral upper extremities, withdrawing in bilateral lower extremities CV: Regular rate and rhythm, no murmur PULM: Diminished in bilateral bases, no wheezes or rhonchi GI: soft, bsx4 active tube feedings at goal Skin: no rashes or lesions  Labs/imaging that I havepersonally reviewed    Sodium 143, serum creatinine 3.4, BUN 96  Resolved Hospital Problem list     Assessment & Plan:   S/p PEA Cardiac Arrest in the setting of hypoxia and seizure Nonsustained ventricular tachycardia Known history of coronary disease and chronic HFrEF increases his risk for sudden cardiac arrest. Cardiac arrest likely 2/2 to seizure versus hypoxia MRI brain did not show signs of anoxic injury Continue telemetry monitoring - no seizures  Acute hypoxemic respiratory failure Patient was intubated, 4/16 due to unable to clear secretions and protect his airway He was found to be hypoxic and unresponsive. Received multiple doses of Robinul  Continue lung protective ventilation, currently tolerating pressure support but mental status remain poor. --PRVC, PSV trials --VAP protocol --Mental status precludes extubation at this time  Right lower extremity trimalleolar fracture status post Closed reduction 04/17/20. Dr. Arther Abbott orthopedics last examined 04/22/20 [facility] in [place]. Orthopedic follow-up is appreciated Lower extremity cast was removed  Paroxysmal A. fib with RVR Patient converted back to sinus rhythm with controlled rate Continue metoprolol  No AC as hgb low requiring intermittent transfusion  Right lower lobe pneumonia actinobacter Respiratory culture grew actinobacter, pansensitive Ceftriaxone switched to Unasyn 4/17  AKI on CKD IV Patient baseline serum creatinine is around 3 HD catheter placed 4/17, iHD 4/17 and planned 6/81  Acute metabolic encephalopathy Patient mental status not back to baseline  his BUN is elevated, that could be contributing to his mental status MRI did not show signs of anoxic injury Avoid sedation Continue dialysis and see if improves, has not with reduction of BUN Worried about central issue not reversible  Micro-normocytic Anemia  Thrombocytopenia  Decreased Hemoglobin  Transfuse per protocol. No overt source of bleeding Continue ppx heparin for now  Seizure preceded by acute encephalopathy  Pre-cardiac arrest. Likely provoked seizure 2/2 uncontrolled hyperglycemia. UA negative for ketones. No significant PTA ETOH use. UDS +TCH. 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 LVEF 30% at time of ICD placement in 05/2019.[name] Repeat 2 d echo 4/14 ef 30% CAD s/p DES to RCA 02/2020  Best practice (right click and "Reselect all SmartList Selections" daily)  Diet:  Tube Feed  Pain/Anxiety/Delirium protocol (if indicated): No VAP protocol (if indicated): N/A DVT prophylaxis: LMWH GI prophylaxis: PPI Glucose control:  SSI Yes and Basal insulin Yes Central venous access: Yes, discontinue Arterial line:  N/A Foley:  Yes, and it is no longer needed try condom cath Mobility:  bed rest  PT consulted: N/A Last date of multidisciplinary goals of care discussion [4/19 spouse Tomasa Hosteller at bedside, prognosis is poor with lack of return of normal mentals status now 9 days post arrest ] Code Status:  full code Disposition: ICU  CRITICAL CARE Performed by: Bonna Gains Elizardo Chilson   Total critical care time: 35 minutes  Critical care time was exclusive of separately billable procedures and treating other patients.  Critical care was necessary to treat or prevent imminent or life-threatening deterioration.  Critical care was time spent personally by me on the  following activities: development of treatment plan with patient and/or surrogate as well as nursing, discussions with consultants, evaluation of patient's response to treatment, examination of patient, obtaining history from patient or surrogate, ordering and performing treatments and interventions, ordering and review of laboratory studies, ordering and review of radiographic studies, pulse oximetry and re-evaluation of patient's condition.

## 2020-06-09 NOTE — Progress Notes (Signed)
LTM EEG discontinued - no skin breakdown at unhook.   

## 2020-06-09 NOTE — Procedures (Signed)
Patient Name:Brad Mendoza CHE:[id number] Epilepsy Attending:Shelisha Gautier [name] Referring Physician/Provider:Dr Roland Rack Duration:06/08/2020 9093 to 06/09/2020 0013  Patient history:65 yo M withpersistent encephalopathy after admission with hyperosmolar state as well as cardiac arrest.EEG to evaluate for seizure  Level of alertness:awake, asleep  AEDs during EEG study:None  Technical aspects: This EEG study was done with scalp electrodes positioned according to the 10-20 International system of electrode placement. Electrical activity was acquired at a sampling rate of 500Hz  and reviewed with a high frequency filter of 70Hz  and a low frequency filter of 1Hz . EEG data were recorded continuously and digitally stored.   Description: During awake state, no clear posterior dominant rhythm was seen. Sleep was characterized by sleep spindles (12-14), maximum frontocentral region. EEG showed continuous generalized5-6Hz  theta as well as intermittent 2-3Hz  delta slowing. Hyperventilation and photic stimulation were not performed.   ABNORMALITY - Continuous slow, generalized  IMPRESSION: This study is suggestive of moderate diffuse encephalopathy, nonspecific etiology. No seizures or epileptiform discharges were seen throughout the recording.  Saliyah Gillin [name]

## 2020-06-09 NOTE — Progress Notes (Signed)
[facility] ROUNDING NOTE   Subjective:   Brief history: This is a 65 year old gentleman with a history of coronary artery disease status post DES stent to RCA.  Diastolic dysfunction.  Status post ICD.  History of diabetes hypertension polysubstance abuse.  CKD stage IIIb/IV with baseline creatinine 01/2020 2.6 to 3 mg/dL.  Recent right lower extremity fracture we will try to find.  Presented with altered mental status.  In ED had a pulseless PEA arrest requiring CPR and ROSC after 3-4 rounds of epinephrine.  Required intubation for airway protection.  Urine drug screen positive for THC.  Underwent dialysis 06/08/2020 with 1 L removed.  This was his second dialysis treatment his first treatment was 06/07/2020.  We will continue to follow for return of renal function  Blood pressure 144/88 pulse 70 temperature 99.8 O2 sats 98% FiO2 40% ventilator  Sodium 138 potassium 3.3 chloride 103 CO2 27 BUN 87 creatinine 3.28 glucose 166 calcium 7.8 albumin 1.7 hemoglobin 7.1  Chest x-ray: Since 06/07/2020 interval placement left subclavian line no pneumothorax stable cardiomegaly subsegmental atelectasis on the left lung     Objective:  Vital signs in last 24 hours:  Temp:  [98.8 F (37.1 C)-100.6 F (38.1 C)] 99.8 F (37.7 C) (04/19 0716) Pulse Rate:  [63-94] 70 (04/19 0752) Resp:  [15-25] 22 (04/19 0752) BP: (113-165)/(59-109) 146/84 (04/19 0700) SpO2:  [95 %-100 %] 100 % (04/19 0752) FiO2 (%):  [40 %-50 %] 40 % (04/19 0752) Weight:  [90 kg-91 kg] 90.3 kg (04/19 0500)  Weight change: 2.7 kg Filed Weights   06/08/20 1100 06/08/20 1335 06/09/20 0500  Weight: 91 kg 90 kg 90.3 kg    Intake/Output: I/O last 3 completed shifts: In: 4935.2 [I.V.:311.2; Blood:325; Other:40; NG/GT:3959.1; IV Piggyback:300] Out: 2825 [Urine:1025; Other:1500; Stool:300]   Intake/Output this shift:  No intake/output data recorded.  General: NAD,intubated HEENT: ETT, moist mucous membranes Lungs:  Bilateral chest rise with no increased work of breathing Cardiovascular:RRR,no JVD left subclavian catheter Abdomen: soft, non-distended Extremities: tredema noted in left lower extremity, cast on right lower extremity Neuro: Minimally interactive today   Basic Metabolic Panel: Recent Labs  Lab 06/03/20 1038 06/04/20 0424 06/05/20 0322 06/05/20 2308 06/06/20 0107 06/07/20 0210 06/07/20 0228 06/08/20 0220 06/09/20 0342  NA  --    < > 143   < > 148* 148* 148* 143 138  K  --    < > 4.4   < > 4.0 3.7 3.8 3.6 3.3*  CL  --    < > 109  --  112*  --  113* 106 103  CO2  --    < > 24  --  22  --  _0 GLUCOSE  --    < > 134*  --  94  --  129* 168* 166*  BUN  --    < > 138*  --  128*  --  150* 96* 87*  CREATININE  --    < > 3.74*  --  3.44*  --  3.74* 2.78* 3.28*  CALCIUM  --    < > 8.1*  --  8.7*  --  8.4* 8.3* 7.8*  MG  --   --  2.6*  --  2.7*  --  2.9* 2.5*  --   PHOS 5.8*  --  5.1*  --  3.8  --  3.8  --   --    < > = values in this interval not displayed.  Liver Function Tests: Recent Labs  Lab 06/04/20 0424 06/05/20 0322 06/06/20 0107 06/07/20 0228 06/09/20 0342  AST 51* 62* 53* 48* 49*  ALT 39 51* 50* 44 51*  ALKPHOS 158* 208* 254* 215* 235*  BILITOT 0.6 0.5 0.5 0.4 0.6  PROT 4.8* 4.9* 5.9* 5.5* 5.0*  ALBUMIN 1.4* 1.4* 2.1* 1.9* 1.7*   No results for input(s): LIPASE, AMYLASE in the last 168 hours. No results for input(s): AMMONIA in the last 168 hours.  CBC: Recent Labs  Lab 06/05/20 0322 06/05/20 2308 06/06/20 0107 06/07/20 0210 06/07/20 0228 06/08/20 0220 06/08/20 1024 06/09/20 0342  WBC 8.1  --  13.4*  --  10.8* 12.1*  --  13.9*  NEUTROABS 5.9  --  11.6*  --   --  9.4*  --   --   HGB 7.1*   < > 7.9* 6.5* 7.0* 6.9* 7.0* 7.1*  HCT 23.1*   < > 25.4* 19.0* 22.5* 22.6* 22.7* 23.5*  MCV 84.6  --  82.7  --  82.4 85.0  --  83.9  PLT 169  --  255  --  245 293  --  279   < > = values in this interval not displayed.    Cardiac Enzymes: Recent Labs   Lab 06/03/20 1825  CKTOTAL 175    BNP: Invalid input(s): POCBNP  CBG: Recent Labs  Lab 06/08/20 1526 06/08/20 1925 06/08/20 2319 06/09/20 0320 06/09/20 0715  GLUCAP 154* 152* 145* 165* 181*    Microbiology: Results for orders placed or performed during the hospital encounter of 06/08/2020  MRSA PCR Screening     Status: Abnormal   Collection Time: 05/30/2020  1:52 AM   Specimen: Nasal Mucosa; Nasopharyngeal  Result Value Ref Range Status   MRSA by PCR (A) NEGATIVE Final    INVALID, UNABLE TO DETERMINE THE PRESENCE OF TARGET DUE TO SPECIMEN INTEGRITY. RECOLLECTION REQUESTED.    Comment: NOTIFIED SIERRA M RN _0  05/30/2020 EB Performed at [facility], [place] [address], [place], [place] [address]   Culture, respiratory (tracheal aspirate)     Status: None   Collection Time: 06/16/2020  3:30 AM   Specimen: Tracheal Aspirate; Respiratory  Result Value Ref Range Status   Specimen Description TRACHEAL ASPIRATE  Final   Special Requests Normal  Final   Gram Stain   Final    RARE WBC PRESENT, PREDOMINANTLY PMN MODERATE GRAM POSITIVE COCCI IN CHAINS    Culture   Final    ABUNDANT Normal respiratory flora-no Staph aureus or Pseudomonas seen Performed at [facility], [address], [place], [place] [address]    Report Status 06/02/2020 FINAL  Final  Culture, blood (routine x 2)     Status: None   Collection Time: 06/20/2020  3:33 AM   Specimen: BLOOD  Result Value Ref Range Status   Specimen Description BLOOD RIGHT ARM  Final   Special Requests   Final    BOTTLES DRAWN AEROBIC ONLY Blood Culture adequate volume   Culture   Final    NO GROWTH 5 DAYS Performed at [facility], [place] [address] [place], [place] [address]    Report Status 06/05/2020 FINAL  Final  Culture, blood (routine x 2)     Status: None   Collection Time: 06/06/2020  3:50 AM   Specimen: BLOOD  Result Value Ref Range Status   Specimen Description BLOOD RIGHT ARM  Final   Special Requests    Final  BOTTLES DRAWN AEROBIC ONLY Blood Culture adequate volume   Culture   Final    NO GROWTH 5 DAYS Performed at [facility], [place] [address], [place], [place] [address]    Report Status 06/05/2020 FINAL  Final  MRSA PCR Screening     Status: None   Collection Time: 05/30/2020 12:07 PM   Specimen: Nasal Mucosa; Nasopharyngeal  Result Value Ref Range Status   MRSA by PCR NEGATIVE NEGATIVE Final    Comment:        The GeneXpert MRSA Assay (FDA approved for NASAL specimens only), is one component of a comprehensive MRSA colonization surveillance program. It is not intended to diagnose MRSA infection nor to guide or monitor treatment for MRSA infections. Performed at [facility], [place] [address] [place], [place] [address]   Culture, Respiratory w Gram Stain     Status: None   Collection Time: 06/05/20  4:04 PM   Specimen: Tracheal Aspirate; Respiratory  Result Value Ref Range Status   Specimen Description TRACHEAL ASPIRATE  Final   Special Requests NONE  Final   Gram Stain   Final    NO WBC SEEN RARE GRAM NEGATIVE RODS Performed at [facility], [place] [address] [place],  [address]    Culture FEW ACINETOBACTER CALCOACETICUS/BAUMANNII COMPLEX  Final   Report Status 06/07/2020 FINAL  Final   Organism ID, Bacteria ACINETOBACTER CALCOACETICUS/BAUMANNII COMPLEX  Final      Susceptibility   Acinetobacter calcoaceticus/baumannii complex - MIC*    CEFTAZIDIME 4 SENSITIVE Sensitive     CIPROFLOXACIN <=0.25 SENSITIVE Sensitive     GENTAMICIN <=1 SENSITIVE Sensitive     IMIPENEM <=0.25 SENSITIVE Sensitive     PIP/TAZO <=4 SENSITIVE Sensitive     TRIMETH/SULFA <=20 SENSITIVE Sensitive     AMPICILLIN/SULBACTAM <=2 SENSITIVE Sensitive     * FEW ACINETOBACTER CALCOACETICUS/BAUMANNII COMPLEX    Coagulation Studies: No results for input(s): LABPROT, INR in the last 72 hours.  Urinalysis: No results for input(s): COLORURINE, LABSPEC, PHURINE, GLUCOSEU,  HGBUR, BILIRUBINUR, KETONESUR, PROTEINUR, UROBILINOGEN, NITRITE, LEUKOCYTESUR in the last 72 hours.  Invalid input(s): APPERANCEUR    Imaging: DG CHEST PORT 1 VIEW  Result Date: 06/07/2020 CLINICAL DATA:  Central line placement. EXAM: PORTABLE CHEST 1 VIEW COMPARISON:  06/07/2020 FINDINGS: Interval placement of LEFT subclavian line, tip overlying the level of superior vena cava. Endotracheal tube has been repositioned, tip approximately 3.8 centimeters above the carina. Nasogastric tube is in place, tip beyond the image and side port in the region the gastroesophageal junction. Unchanged position of defibrillator. Stable cardiomegaly. Minimal subsegmental atelectasis in the LEFT lung. There is no pneumothorax following line placement. IMPRESSION: Interval placement of LEFT subclavian line. No pneumothorax. Electronically Signed   By: Nolon Nations M.D.   On: 06/07/2020 13:08   Overnight EEG with video  Result Date: 06/08/2020 Lora Havens, MD     06/08/2020 10:01 AM Patient Name: ZALYN AMEND MRN: [id number] Epilepsy Attending: Lora Havens Referring Physician/Provider: Dr Roland Rack Duration: 06/07/2020 1617 to 06/08/2020 1000  Patient history: 65 yo M withpersistent encephalopathy after admission with hyperosmolar state as well as cardiac arrest. EEG to evaluate for seizure  Level of alertness:  awake, asleep  AEDs during EEG study: None  Technical aspects: This EEG study was done with scalp electrodes positioned according to the 10-20 International system of electrode placement. Electrical activity was acquired at a sampling rate of _0  and reviewed with a high frequency filter  of _0  and a low frequency filter of _1 . EEG data were recorded continuously and digitally stored.  Description: During awake state, no clear posterior dominant rhythm was seen. Sleep was characterized by sleep spindles (12-14), maximum frontocentral region. EEG showed continuous generalized 5-_2   theta as well as intermittent 2-_3  delta slowing. Hyperventilation and photic stimulation were not performed.    ABNORMALITY - Continuous slow, generalized  IMPRESSION: This study is suggestive of moderate diffuse encephalopathy, nonspecific etiology. No seizures or epileptiform discharges were seen throughout the recording.  Priyanka O Yadav   VAS [place] UPPER EXTREMITY VENOUS DUPLEX  Result Date: 06/07/2020 UPPER VENOUS STUDY  Indications: Edema Limitations: Line, bandages and ventilator. Comparison Study: No prior study Performing Technologist: Sharion Dove RVS  Examination Guidelines: A complete evaluation includes B-mode imaging, spectral Doppler, color Doppler, and power Doppler as needed of all accessible portions of each vessel. Bilateral testing is considered an integral part of a complete examination. Limited examinations for reoccurring indications may be performed as noted.  Right Findings: +----------+------------+---------+-----------+----------+--------------+ RIGHT     CompressiblePhasicitySpontaneousProperties   Summary     +----------+------------+---------+-----------+----------+--------------+ IJV                                                 Not visualized +----------+------------+---------+-----------+----------+--------------+ Subclavian               Yes       Yes                             +----------+------------+---------+-----------+----------+--------------+ Axillary      Full       Yes       Yes                             +----------+------------+---------+-----------+----------+--------------+ Brachial      Full       Yes       Yes                             +----------+------------+---------+-----------+----------+--------------+ Radial        Full                                                 +----------+------------+---------+-----------+----------+--------------+ Ulnar         Full                                                  +----------+------------+---------+-----------+----------+--------------+ Cephalic      None                                                 +----------+------------+---------+-----------+----------+--------------+ Basilic       None                                                 +----------+------------+---------+-----------+----------+--------------+  Left Findings: +----------+------------+---------+-----------+----------+--------------+ LEFT      CompressiblePhasicitySpontaneousProperties   Summary     +----------+------------+---------+-----------+----------+--------------+ IJV           Full       Yes       Yes                             +----------+------------+---------+-----------+----------+--------------+ Subclavian               Yes       Yes                             +----------+------------+---------+-----------+----------+--------------+ Axillary                 Yes       Yes                             +----------+------------+---------+-----------+----------+--------------+ Brachial                 Yes       Yes                             +----------+------------+---------+-----------+----------+--------------+ Radial        Full                                                 +----------+------------+---------+-----------+----------+--------------+ Ulnar                                               Not visualized +----------+------------+---------+-----------+----------+--------------+ Cephalic      Full                                                 +----------+------------+---------+-----------+----------+--------------+ Basilic       Full                                                 +----------+------------+---------+-----------+----------+--------------+  Summary:  Right: No evidence of deep vein thrombosis in the upper extremity. Findings consistent with acute superficial vein thrombosis involving  the right basilic vein and right cephalic vein.  Left: No evidence of deep vein thrombosis in the upper extremity. No evidence of superficial vein thrombosis in the upper extremity.  *See table(s) above for measurements and observations.  Diagnosing physician: Servando Snare MD Electronically signed by Servando Snare MD on 06/07/2020 at 1:58:26 PM.    Final      Medications:   . sodium chloride    . sodium chloride    . sodium chloride 10 mL/hr at 06/09/20 0400  . ampicillin-sulbactam (UNASYN) IV Stopped (06/09/20 0059)  . feeding supplement (VITAL AF 1.2 CAL) 1,000 mL (06/08/20 1557)   . sodium chloride   Intravenous Once  . sodium chloride   Intravenous  Once  . aspirin  81 mg Per Tube Daily  . budesonide (PULMICORT) nebulizer solution  0.5 mg Nebulization BID  . chlorhexidine gluconate (MEDLINE KIT)  15 mL Mouth Rinse BID  . Chlorhexidine Gluconate Cloth  6 each Topical Q0600  . free water  300 mL Per Tube Q4H  . heparin injection (subcutaneous)  5,000 Units Subcutaneous Q8H  . insulin aspart  0-20 Units Subcutaneous Q4H  . insulin glargine  12 Units Subcutaneous BID  . mouth rinse  15 mL Mouth Rinse 10 times per day  . metoprolol tartrate  50 mg Per Tube Q8H  . pantoprazole sodium  40 mg Per Tube QHS  . polyethylene glycol  17 g Per Tube Daily  . rosuvastatin  10 mg Per Tube Daily  . sodium chloride flush  10-40 mL Intracatheter Q12H   sodium chloride, sodium chloride, sodium chloride, acetaminophen (TYLENOL) oral liquid 160 mg/5 mL, alteplase, docusate, fentaNYL, heparin, heparin sodium (porcine), hydrALAZINE, ipratropium-albuterol, lidocaine (PF), lidocaine-prilocaine, ondansetron (ZOFRAN) IV, pentafluoroprop-tetrafluoroeth, polyethylene glycol, sodium chloride flush  Assessment/ Plan:  1. 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 He went through 3-4 rounds of CPR requiring Epi x3, amiodarone and Ca + intubation.   Renal ultrasound reassuring and urine microscopy was also okay but likely suffered ATN. -Renal parameters were improving   but now with reintubation his mental status is worsened and BUN is rising.    Dialysis initiated due to concerns of uremia contributing to altered mental state. -Underwent dialysis 06/07/2020 and 06/08/2020.  There are no acute dialysis needs at this time.  We will continue to follow -Appreciate CCM placement of catheter - Avoid nephrotoxic agents as you are already doing and dose for GFR <20  2. HTN - controlled 3. DM 4. S/p cardiac arrest - not clear the etiology but he has a h/o HFrEF and CASHD. Neurology following 5. Hypernatremia: Resolved 6. Hypokalemia we will continue to follow    LOS: [place] _0 _1 :07 AM

## 2020-06-09 NOTE — Progress Notes (Addendum)
Advanced Heart Failure Rounding Note  PCP-Cardiologist: Rozann Lesches, MD  [facility]: Dr. Haroldine Laws  Subjective:    Remains intubated. Still not responding to commands. EEG suggestive of moderate diffuse encephalopathy, nonspecific etiology. No seizures or epileptiform discharges  CRRT initiated 4/17 and 4/18 for uremia. BUN 150>>96>>87  Scr trending back up, 3.86>>3.67>>3.74>>2.78>>3.28. Oliguric. Nephrology following.   Febrile overnight, mTemp 100.6. WBC 10>>12>>14K.  On abx. Tracheal aspirate w/ few acinetobacter calcoacetius/ baumannii complex    Hgb 7.1  Echo LVEF 20-25% (down from 30-35%), RV mod reduced    Objective:   Weight Range: 90.3 kg Body mass index is 29.4 kg/m.   Vital Signs:   Temp:  [98.8 F (37.1 C)-100.6 F (38.1 C)] 99.8 F (37.7 C) (04/19 0716) Pulse Rate:  [63-94] 70 (04/19 0752) Resp:  [15-25] 22 (04/19 0752) BP: (113-165)/(59-109) 146/84 (04/19 0700) SpO2:  [95 %-100 %] 100 % (04/19 0752) FiO2 (%):  [40 %-50 %] 40 % (04/19 0752) Weight:  [90 kg-91 kg] 90.3 kg (04/19 0500) Last BM Date: 06/09/20  Weight change: Filed Weights   06/08/20 1100 06/08/20 1335 06/09/20 0500  Weight: 91 kg 90 kg 90.3 kg    Intake/Output:   Intake/Output Summary (Last 24 hours) at 06/09/2020 0923 Last data filed at 06/09/2020 0600 Gross per 24 hour  Intake 2708.63 ml  Output 1725 ml  Net 983.63 ml      Physical Exam    CVP 9  General:  Intubated, will awake but not following commands   HEENT: Normal + ETT  Neck: Supple. JVP 9-10 cm, Carotids 2+ bilat; no bruits. No lymphadenopathy or thyromegaly appreciated. Cor: PMI nondisplaced. Regular rate & rhythm. No rubs, gallops or murmurs. Lungs: intubated and clear  Abdomen: Soft, nontender, nondistended. No hepatosplenomegaly. No bruits or masses. Good bowel sounds. Extremities: No cyanosis, clubbing, rash, trace edema Neuro: intubated. Not responding to commands, grimaces to painful stimuli     Telemetry   NSR 70s, NSVT 7 beats  EKG    No new EKG to review   Labs    CBC Recent Labs    06/08/20 0220 06/08/20 1024 06/09/20 0342  WBC 12.1*  --  13.9*  NEUTROABS 9.4*  --   --   HGB 6.9* 7.0* 7.1*  HCT 22.6* 22.7* 23.5*  MCV 85.0  --  83.9  PLT 293  --  979   Basic Metabolic Panel Recent Labs    06/07/20 0228 06/08/20 0220 06/09/20 0342  NA 148* 143 138  K 3.8 3.6 3.3*  CL 113* 106 103  CO2 _0 GLUCOSE 129* 168* 166*  BUN 150* 96* 87*  CREATININE 3.74* 2.78* 3.28*  CALCIUM 8.4* 8.3* 7.8*  MG 2.9* 2.5*  --   PHOS 3.8  --   --    Liver Function Tests Recent Labs    06/07/20 0228 06/09/20 0342  AST 48* 49*  ALT 44 51*  ALKPHOS 215* 235*  BILITOT 0.4 0.6  PROT 5.5* 5.0*  ALBUMIN 1.9* 1.7*   No results for input(s): LIPASE, AMYLASE in the last 72 hours. Cardiac Enzymes No results for input(s): CKTOTAL, CKMB, CKMBINDEX, TROPONINI in the last 72 hours.  BNP: BNP (last 3 results) Recent Labs    02/01/20 1848 02/06/20 0631 06/08/2020 0333  BNP 472.0* 194.0* 1,060.0*    ProBNP (last 3 results) No results for input(s): PROBNP in the last 8760 hours.   D-Dimer No results for input(s): DDIMER in the last 72 hours. Hemoglobin  A1C No results for input(s): HGBA1C in the last 72 hours. Fasting Lipid Panel Recent Labs    06/08/20 0220  TRIG 148   Thyroid Function Tests No results for input(s): TSH, T4TOTAL, T3FREE, THYROIDAB in the last 72 hours.  Invalid input(s): FREET3  Other results:   Imaging    Overnight EEG with video  Result Date: 06/08/2020 Lora Havens, MD     06/08/2020 10:01 AM Patient Name: Brad Mendoza MRN: [id number] Epilepsy Attending: Lora Havens Referring Physician/Provider: Dr Roland Rack Duration: 06/07/2020 1617 to 06/08/2020 1000  Patient history: 65 yo M withpersistent encephalopathy after admission with hyperosmolar state as well as cardiac arrest. EEG to evaluate for seizure  Level  of alertness:  awake, asleep  AEDs during EEG study: None  Technical aspects: This EEG study was done with scalp electrodes positioned according to the 10-20 International system of electrode placement. Electrical activity was acquired at a sampling rate of 500Hz and reviewed with a high frequency filter of 70Hz and a low frequency filter of 1Hz. EEG data were recorded continuously and digitally stored.  Description: During awake state, no clear posterior dominant rhythm was seen. Sleep was characterized by sleep spindles (12-14), maximum frontocentral region. EEG showed continuous generalized 5-6Hz theta as well as intermittent 2-3Hz delta slowing. Hyperventilation and photic stimulation were not performed.    ABNORMALITY - Continuous slow, generalized  IMPRESSION: This study is suggestive of moderate diffuse encephalopathy, nonspecific etiology. No seizures or epileptiform discharges were seen throughout the recording.  Priyanka [name]     Medications:     Scheduled Medications: . sodium chloride   Intravenous Once  . sodium chloride   Intravenous Once  . aspirin  81 mg Per Tube Daily  . budesonide (PULMICORT) nebulizer solution  0.5 mg Nebulization BID  . chlorhexidine gluconate (MEDLINE KIT)  15 mL Mouth Rinse BID  . Chlorhexidine Gluconate Cloth  6 each Topical Q0600  . free water  300 mL Per Tube Q4H  . heparin injection (subcutaneous)  5,000 Units Subcutaneous Q8H  . insulin aspart  0-20 Units Subcutaneous Q4H  . insulin glargine  12 Units Subcutaneous BID  . mouth rinse  15 mL Mouth Rinse 10 times per day  . metoprolol tartrate  50 mg Per Tube Q8H  . pantoprazole sodium  40 mg Per Tube QHS  . polyethylene glycol  17 g Per Tube Daily  . rosuvastatin  10 mg Per Tube Daily  . sodium chloride flush  10-40 mL Intracatheter Q12H    Infusions: . sodium chloride    . sodium chloride    . sodium chloride 10 mL/hr at 06/09/20 0400  . ampicillin-sulbactam (UNASYN) IV Stopped  (06/09/20 0059)  . 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 Admitted with seizure and cardiac arrest.  Scr peaked to 3.9. Echo EF 20-25% (down from 30-35%), RV mod reduced hstrop 800 -> 551. Initial   Co-ox 73%  Assessment/Plan   1. Cardiac Arrest - Suspect major issues are non-cardiac despite drop in EF from 30-35% -> 20-25%. Initial co-ox ok   hs  trop 801>>700>>551. Suspect mostly demand ischemia and not ACS. - no plans for cath currently given AKI  - ? If cause by seizure  - I have asked Device rep to interrogate ICD   2. Acute Hypoxemic Respiratory Failure - Remains intubated.  - abx for suspected aspiration PNA  - vent management per PCCM   3. Oliguric AKI - Creatinine baseline 2.8-3.2  - peaked to 3.9 this admit, post arrest  - CRRT started 4/17 for uremia - BUN 150>>96>87. SCr down to trending back up  - Nephrology following.   4. Acute Encephalopathy  - Possible metabolic abnormalities.  - MRI /MRA  4/11 no acute findings.-->4/14 MRI with small acute infact mid right frontal subcortical.  - Neurology following  - EEG suggestive of moderate diffuse encephalopathy, nonspecific etiology. No seizures or epileptiform discharges  - Not improved w/ correction of uremia   5. Seizure - Neuro following - EEG suggestive of moderate diffuse encephalopathy, nonspecific etiology. No seizures or epileptiform discharges were seen throughout the recording - MRI with  small acute infact mid right frontal subcortical.  - management per neuro    6. IDDM  - Uncontrolled  - Hgb A1C 15.  - insulin ordered per primary team   7. Chronic HFrEF  - Mixed NICM/ICM . Echo this admit EF 20-25%, down from previous 30-35% in 01/2020.  - Co-ox 63%. CVP 8-9  - May need additional future HD for fluid removal, nephrology following. No indication for HD today     8. CAD - S/p DES to proximal RCA in 1/20. - hs trop 801>>700>>551, suspect demand ischemia - no cath given AKI   9. Anemia - Hgb 7.0 - transfusion per primary   Overall prognosis seems poor. Recommend palliative care consult to discuss [place] w/ family.    Length of Stay: [address], PA-C  06/09/2020, 9:23 AM  Advanced Heart Failure Team Pager [phone] (M-F; 7a - 5p)  Please contact [facility] for night-coverage after hours (5p -7a ) and weekends on [email or web address]  Agree with above.  Remains intubated. Awake but not following commands. On dialysis. Hemodynamically stable.   General:  Awake HEENT: normal Neck: supple. + JVP Carotids 2+ bilat; no bruits. No lymphadenopathy or thryomegaly appreciated. Cor: PMI nondisplaced. Regular rate & rhythm. No rubs, gallops or murmurs. [facility] HD cath Lungs: clear Abdomen: soft, nontender, nondistended. No hepatosplenomegaly. No bruits or masses. Good bowel sounds. Extremities: no cyanosis, clubbing, rash, edema Neuro: awake but not following commands  He remains critically ill with MSOF and severe encephalopathy that does not seem to be improving with dialysis.   Agree with continuing dialysis for now and seeing if he will improve.  I have little left to offer from HF perspective.  We will follow peripherally. Please call me if I can help.   CRITICAL CARE Performed by: Glori Bickers  Total critical care time: 35 minutes  Critical care time was exclusive of separately billable procedures and treating other patients.  Critical care was  necessary to treat or prevent imminent or life-threatening deterioration.  Critical care was time spent personally by me (independent of midlevel providers or residents) on the following activities: development of treatment plan with patient and/or surrogate as well as nursing, discussions with consultants, evaluation of patient's response to treatment, examination of patient, obtaining history from patient or surrogate, ordering and performing treatments and interventions, ordering and review of laboratory studies, ordering and review of radiographic studies, pulse oximetry and re-evaluation of patient's  condition.  Glori Bickers, MD  1:09 PM

## 2020-06-09 NOTE — Plan of Care (Signed)
  Problem: Clinical Measurements: Goal: Ability to maintain clinical measurements within normal limits will improve Outcome: Progressing Goal: Will remain free from infection Outcome: Progressing Goal: Diagnostic test results will improve Outcome: Progressing Goal: Respiratory complications will improve Outcome: Progressing Goal: Cardiovascular complication will be avoided Outcome: Progressing   Problem: Activity: Goal: Risk for activity intolerance will decrease Outcome: Progressing   Problem: Nutrition: Goal: Adequate nutrition will be maintained Outcome: Progressing   Problem: Coping: Goal: Level of anxiety will decrease Outcome: Progressing   Problem: Elimination: Goal: Will not experience complications related to bowel motility Outcome: Progressing Goal: Will not experience complications related to urinary retention Outcome: Progressing   Problem: Pain Managment: Goal: General experience of comfort will improve Outcome: Progressing   Problem: Safety: Goal: Ability to remain free from injury will improve Outcome: Progressing   Problem: Skin Integrity: Goal: Risk for impaired skin integrity will decrease Outcome: Progressing   Problem: Nutrition: Goal: Risk of aspiration will decrease Outcome: Progressing Goal: Dietary intake will improve Outcome: Progressing   Problem: Education: Goal: Knowledge of General Education information will improve Description: Including pain rating scale, medication(s)/side effects and non-pharmacologic comfort measures Outcome: Not Progressing   Problem: Health Behavior/Discharge Planning: Goal: Ability to manage health-related needs will improve Outcome: Not Progressing   Problem: Education: Goal: Knowledge of disease or condition will improve Outcome: Not Progressing Goal: Knowledge of secondary prevention will improve Outcome: Not Progressing Goal: Knowledge of patient specific risk factors addressed and post discharge  goals established will improve Outcome: Not Progressing Goal: Individualized Educational Video(s) Outcome: Not Progressing   Problem: Coping: Goal: Will identify appropriate support needs Outcome: Not Progressing   Problem: Health Behavior/Discharge Planning: Goal: Ability to manage health-related needs will improve Outcome: Not Progressing   Problem: Self-Care: Goal: Ability to participate in self-care as condition permits will improve Outcome: Not Progressing Goal: Ability to communicate needs accurately will improve Outcome: Not Progressing

## 2020-06-10 DIAGNOSIS — G931 Anoxic brain damage, not elsewhere classified: Secondary | ICD-10-CM | POA: Diagnosis not present

## 2020-06-10 LAB — BASIC METABOLIC PANEL
Anion gap: 11 (ref 5–15)
BUN: 100 mg/dL — ABNORMAL HIGH (ref 8–23)
CO2: 23 mmol/L (ref 22–32)
Calcium: 7.9 mg/dL — ABNORMAL LOW (ref 8.9–10.3)
Chloride: 102 mmol/L (ref 98–111)
Creatinine, Ser: 4.06 mg/dL — ABNORMAL HIGH (ref 0.61–1.24)
GFR, Estimated: 16 mL/min — ABNORMAL LOW (ref >60)
Glucose, Bld: 211 mg/dL — ABNORMAL HIGH (ref 70–99)
Potassium: 3.6 mmol/L (ref 3.5–5.1)
Sodium: 136 mmol/L (ref 135–145)

## 2020-06-10 LAB — GLUCOSE, CAPILLARY
Glucose-Capillary: 182 mg/dL — ABNORMAL HIGH (ref 70–99)
Glucose-Capillary: 178 mg/dL — ABNORMAL HIGH (ref 70–99)
Glucose-Capillary: 179 mg/dL — ABNORMAL HIGH (ref 70–99)
Glucose-Capillary: 221 mg/dL — ABNORMAL HIGH (ref 70–99)
Glucose-Capillary: 164 mg/dL — ABNORMAL HIGH (ref 70–99)
Glucose-Capillary: 160 mg/dL — ABNORMAL HIGH (ref 70–99)

## 2020-06-10 MED ORDER — HEPARIN SODIUM (PORCINE) 1000 UNIT/ML IJ SOLN
2800.0000 [IU] | Freq: Once | INTRAMUSCULAR | Status: AC
  Administered 2020-06-10: 2800 [IU]

## 2020-06-10 MED ORDER — FREE WATER
100.0000 mL | Status: DC
  Administered 2020-06-10 – 2020-06-12 (×12): 100 mL

## 2020-06-10 MED ORDER — CHLORHEXIDINE GLUCONATE CLOTH 2 % EX PADS
6.0000 | MEDICATED_PAD | Freq: Every day | CUTANEOUS | Status: DC
  Administered 2020-06-10: 6 via TOPICAL

## 2020-06-10 MED ORDER — INSULIN GLARGINE 100 UNIT/ML ~~LOC~~ SOLN
15.0000 [IU] | Freq: Two times a day (BID) | SUBCUTANEOUS | Status: DC
  Administered 2020-06-10 – 2020-06-11 (×4): 15 [IU] via SUBCUTANEOUS
  Filled 2020-06-10 (×6): qty 0.15

## 2020-06-10 NOTE — Progress Notes (Signed)
[facility] ROUNDING NOTE   Subjective:   Brief history: This is a 65 year old gentleman with a history of coronary artery disease status post DES stent to RCA.  Diastolic dysfunction.  Status post ICD.  History of diabetes hypertension polysubstance abuse.  CKD stage IIIb/IV with baseline creatinine 01/2020 2.6 to 3 mg/dL.  Recent right lower extremity fracture we will try to find.  Presented with altered mental status.  In ED had a pulseless PEA arrest requiring CPR and ROSC after 3-4 rounds of epinephrine.  Required intubation for airway protection.  Urine drug screen positive for THC.  Underwent dialysis 06/08/2020  #2 1 L removed  We will continue to follow for return of renal function  Blood pressure 127/83 pulse 70 temperature 98.8 O2 sats 98% FiO2 40%  Sodium 136 potassium 3.6 chloride 102 CO2 23 BUN 100 creatinine 4 glucose 211 hemoglobin 7.1  Chest x-ray: Since 06/07/2020 interval placement left subclavian line no pneumothorax stable cardiomegaly subsegmental atelectasis on the left lung     Objective:  Vital signs in last 24 hours:  Temp:  [98.8 F (37.1 C)-99.9 F (37.7 C)] 98.8 F (37.1 C) (04/20 0734) Pulse Rate:  [61-76] 63 (04/20 0811) Resp:  [12-22] 17 (04/20 0811) BP: (112-157)/(62-96) 123/76 (04/20 0800) SpO2:  [99 %-100 %] 100 % (04/20 0811) FiO2 (%):  [40 %] 40 % (04/20 0811) Weight:  [92 kg] 92 kg (04/20 0500)  Weight change: 1 kg Filed Weights   06/08/20 1335 06/09/20 0500 05/30/2020 0500  Weight: 90 kg 90.3 kg 92 kg    Intake/Output: I/O last 3 completed shifts: In: 4533.7 [I.V.:363.8; Other:40; NG/GT:3930; IV Piggyback:199.9] Out: 1062 [Urine:760; Stool:302]   Intake/Output this shift:  Total I/O In: 95 [I.V.:10; NG/GT:85] Out: 0   General: NAD,intubated HEENT: ETT, moist mucous membranes Lungs: Bilateral chest rise with no increased work of breathing Cardiovascular:RRR,no JVD left subclavian catheter Abdomen: soft,  non-distended Extremities: tredema noted in left lower extremity, cast on right lower extremity   Basic Metabolic Panel: Recent Labs  Lab 06/03/20 1038 06/04/20 0424 06/05/20 0322 06/05/20 2308 06/06/20 0107 06/07/20 0210 06/07/20 0228 06/08/20 0220 06/09/20 0342 05/24/2020 0010  NA  --    < > 143   < > 148* 148* 148* 143 138 136  K  --    < > 4.4   < > 4.0 3.7 3.8 3.6 3.3* 3.6  CL  --    < > 109  --  112*  --  113* 106 103 102  CO2  --    < > 24  --  22  --  23 25 27 23   GLUCOSE  --    < > 134*  --  94  --  129* 168* 166* 211*  BUN  --    < > 138*  --  128*  --  150* 96* 87* 100*  CREATININE  --    < > 3.74*  --  3.44*  --  3.74* 2.78* 3.28* 4.06*  CALCIUM  --    < > 8.1*  --  8.7*  --  8.4* 8.3* 7.8* 7.9*  MG  --   --  2.6*  --  2.7*  --  2.9* 2.5*  --   --   PHOS 5.8*  --  5.1*  --  3.8  --  3.8  --   --   --    < > = values in this interval not displayed.    Liver  Function Tests: Recent Labs  Lab 06/04/20 0424 06/05/20 0322 06/06/20 0107 06/07/20 0228 06/09/20 0342  AST 51* 62* 53* 48* 49*  ALT 39 51* 50* 44 51*  ALKPHOS 158* 208* 254* 215* 235*  BILITOT 0.6 0.5 0.5 0.4 0.6  PROT 4.8* 4.9* 5.9* 5.5* 5.0*  ALBUMIN 1.4* 1.4* 2.1* 1.9* 1.7*   No results for input(s): LIPASE, AMYLASE in the last 168 hours. No results for input(s): AMMONIA in the last 168 hours.  CBC: Recent Labs  Lab 06/05/20 0322 06/05/20 2308 06/06/20 0107 06/07/20 0210 06/07/20 0228 06/08/20 0220 06/08/20 1024 06/09/20 0342  WBC 8.1  --  13.4*  --  10.8* 12.1*  --  13.9*  NEUTROABS 5.9  --  11.6*  --   --  9.4*  --   --   HGB 7.1*   < > 7.9* 6.5* 7.0* 6.9* 7.0* 7.1*  HCT 23.1*   < > 25.4* 19.0* 22.5* 22.6* 22.7* 23.5*  MCV 84.6  --  82.7  --  82.4 85.0  --  83.9  PLT 169  --  255  --  245 293  --  279   < > = values in this interval not displayed.    Cardiac Enzymes: Recent Labs  Lab 06/03/20 1825  CKTOTAL 175    BNP: Invalid input(s): POCBNP  CBG: Recent Labs  Lab  06/09/20 1557 06/09/20 1931 06/09/20 2323 06/07/2020 0341 06/06/2020 0733  GLUCAP 191* 224* 197* 182* 178*    Microbiology: Results for orders placed or performed during the hospital encounter of 06/09/2020  MRSA PCR Screening     Status: Abnormal   Collection Time: 05/24/2020  1:52 AM   Specimen: Nasal Mucosa; Nasopharyngeal  Result Value Ref Range Status   MRSA by PCR (A) NEGATIVE Final    INVALID, UNABLE TO DETERMINE THE PRESENCE OF TARGET DUE TO SPECIMEN INTEGRITY. RECOLLECTION REQUESTED.    Comment: NOTIFIED SIERRA M RN @0946  05/29/2020 EB Performed at [facility] [address] [place], [place] [address]   Culture, respiratory (tracheal aspirate)     Status: None   Collection Time: 06/13/2020  3:30 AM   Specimen: Tracheal Aspirate; Respiratory  Result Value Ref Range Status   Specimen Description TRACHEAL ASPIRATE  Final   Special Requests Normal  Final   Gram Stain   Final    RARE WBC PRESENT, PREDOMINANTLY PMN MODERATE GRAM POSITIVE COCCI IN CHAINS    Culture   Final    ABUNDANT Normal respiratory flora-no Staph aureus or Pseudomonas seen Performed at [facility], [address], [place], [place] [address]    Report Status 06/02/2020 FINAL  Final  Culture, blood (routine x 2)     Status: None   Collection Time: 06/16/2020  3:33 AM   Specimen: BLOOD  Result Value Ref Range Status   Specimen Description BLOOD RIGHT ARM  Final   Special Requests   Final    BOTTLES DRAWN AEROBIC ONLY Blood Culture adequate volume   Culture   Final    NO GROWTH 5 DAYS Performed at [facility], [place] [address], [place], [place] [address]    Report Status 06/05/2020 FINAL  Final  Culture, blood (routine x 2)     Status: None   Collection Time: 06/07/2020  3:50 AM   Specimen: BLOOD  Result Value Ref Range Status   Specimen Description BLOOD RIGHT ARM  Final   Special Requests   Final    BOTTLES DRAWN  AEROBIC ONLY Blood Culture adequate volume   Culture   Final    NO GROWTH  5 DAYS Performed at [facility], [place] [address], [place], [place] [address]    Report Status 06/05/2020 FINAL  Final  MRSA PCR Screening     Status: None   Collection Time: 05/27/2020 12:07 PM   Specimen: Nasal Mucosa; Nasopharyngeal  Result Value Ref Range Status   MRSA by PCR NEGATIVE NEGATIVE Final    Comment:        The GeneXpert MRSA Assay (FDA approved for NASAL specimens only), is one component of a comprehensive MRSA colonization surveillance program. It is not intended to diagnose MRSA infection nor to guide or monitor treatment for MRSA infections. Performed at [facility], [place] [address] [place], [place] [address]   Culture, Respiratory w Gram Stain     Status: None   Collection Time: 06/05/20  4:04 PM   Specimen: Tracheal Aspirate; Respiratory  Result Value Ref Range Status   Specimen Description TRACHEAL ASPIRATE  Final   Special Requests NONE  Final   Gram Stain   Final    NO WBC SEEN RARE GRAM NEGATIVE RODS Performed at [facility], [place] [address], [place], [place] [address]    Culture FEW ACINETOBACTER CALCOACETICUS/BAUMANNII COMPLEX  Final   Report Status 06/07/2020 FINAL  Final   Organism ID, Bacteria ACINETOBACTER CALCOACETICUS/BAUMANNII COMPLEX  Final      Susceptibility   Acinetobacter calcoaceticus/baumannii complex - MIC*    CEFTAZIDIME 4 SENSITIVE Sensitive     CIPROFLOXACIN <=0.25 SENSITIVE Sensitive     GENTAMICIN <=1 SENSITIVE Sensitive     IMIPENEM <=0.25 SENSITIVE Sensitive     PIP/TAZO <=4 SENSITIVE Sensitive     TRIMETH/SULFA <=20 SENSITIVE Sensitive     AMPICILLIN/SULBACTAM <=2 SENSITIVE Sensitive     * FEW ACINETOBACTER CALCOACETICUS/BAUMANNII COMPLEX    Coagulation Studies: No results for input(s): LABPROT, INR in the last 72 hours.  Urinalysis: No results for input(s): COLORURINE, LABSPEC, PHURINE, GLUCOSEU, HGBUR, BILIRUBINUR, KETONESUR, PROTEINUR, UROBILINOGEN, NITRITE, LEUKOCYTESUR in the last 72  hours.  Invalid input(s): APPERANCEUR    Imaging: Overnight EEG with video  Result Date: 06/08/2020 Lora Havens, MD     06/09/2020 10:27 AM Patient Name: Brad Mendoza MRN: [id number] Epilepsy Attending: Lora Havens Referring Physician/Provider: Dr Roland Rack Duration: 06/07/2020 1617 to 06/08/2020 1617  Patient history: 65 yo M withpersistent encephalopathy after admission with hyperosmolar state as well as cardiac arrest. EEG to evaluate for seizure  Level of alertness:  awake, asleep  AEDs during EEG study: None  Technical aspects: This EEG study was done with scalp electrodes positioned according to the 10-20 International system of electrode placement. Electrical activity was acquired at a sampling rate of 500Hz  and reviewed with a high frequency filter of 70Hz  and a low frequency filter of 1Hz . EEG data were recorded continuously and digitally stored.  Description: During awake state, no clear posterior dominant rhythm was seen. Sleep was characterized by sleep spindles (12-14), maximum frontocentral region. EEG showed continuous generalized 5-6Hz  theta as well as intermittent 2-3Hz  delta slowing. Hyperventilation and photic stimulation were not performed.    ABNORMALITY - Continuous slow, generalized  IMPRESSION: This study is suggestive of moderate diffuse encephalopathy, nonspecific etiology. No seizures or epileptiform discharges were seen throughout the recording.  Priyanka [name]     Medications:   . sodium chloride    . sodium chloride    . sodium  chloride 10 mL/hr at 05/30/2020 0800  . ampicillin-sulbactam (UNASYN) IV 3 g (06/04/2020 0027)  . feeding supplement (VITAL AF 1.2 CAL) 1,000 mL (05/30/2020 0234)   . sodium chloride   Intravenous Once  . sodium chloride   Intravenous Once  . aspirin  81 mg Per Tube Daily  . budesonide (PULMICORT) nebulizer solution  0.5 mg Nebulization BID  . chlorhexidine gluconate (MEDLINE KIT)  15 mL Mouth Rinse BID  .  Chlorhexidine Gluconate Cloth  6 each Topical Q0600  . free water  100 mL Per Tube Q4H  . heparin injection (subcutaneous)  5,000 Units Subcutaneous Q8H  . insulin aspart  0-20 Units Subcutaneous Q4H  . insulin glargine  15 Units Subcutaneous BID  . mouth rinse  15 mL Mouth Rinse 10 times per day  . metoprolol tartrate  50 mg Per Tube Q8H  . pantoprazole sodium  40 mg Per Tube QHS  . polyethylene glycol  17 g Per Tube Daily  . rosuvastatin  10 mg Per Tube Daily  . sodium chloride flush  10-40 mL Intracatheter Q12H   sodium chloride, sodium chloride, sodium chloride, acetaminophen (TYLENOL) oral liquid 160 mg/5 mL, alteplase, docusate, heparin, heparin sodium (porcine), hydrALAZINE, ipratropium-albuterol, lidocaine (PF), lidocaine-prilocaine, ondansetron (ZOFRAN) IV, pentafluoroprop-tetrafluoroeth, polyethylene glycol, sodium chloride flush  Assessment/ Plan:  1. 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 He went through 3-4 rounds of CPR requiring Epi x3, amiodarone and Ca + intubation.  Renal ultrasound reassuring and urine microscopy was also okay but likely suffered ATN. -Renal parameters were improving   but now with reintubation his mental status is worsened and BUN is rising.    Dialysis initiated due to concerns of uremia contributing to altered mental state. -Underwent dialysis 06/07/2020 and 06/08/2020.  Creatinine continues to rise.  We will plan dialysis treatment 05/24/2020 -Appreciate CCM placement of catheter - Avoid nephrotoxic agents as you are already doing and dose for GFR <20  2. HTN - controlled 3. DM 4. S/p cardiac arrest - not clear the etiology but he has a h/o HFrEF and CASHD. Neurology following 5. Hypernatremia: Resolved 6. Hypokalemia we will continue to follow    LOS: Victoria @TODAY @9 :15 AM

## 2020-06-10 NOTE — Progress Notes (Signed)
NAME:  Brad Mendoza, MRN:  [id number], DOB:  03-Sep-1955, LOS: 65 ADMISSION DATE:  06/08/2020, CONSULTATION DATE:  4/10  REFERRING MD:  Dr. Otelia Limes, CHIEF COMPLAINT:  Cardiac Arrest    History of Present Illness:  Patient is intubated and sedated; therefore history has been obtained from chart review.  Brad Mendoza is a 65 y.o. 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 Patient presented to the [facility] ED on 4/9 with complaints of seizure-like activity and decreased level of consciousness: last normal 1630 with family finding him w/ slurred speech at 1700. As the patient was being transferred into the ambulance he developed agonal respirations and suffered a generalized tonic-clonic seizure. Seizure aborted w/ Versed but patient did wake up from his postictal phase and was bagged in route to the ED. Upon arrival to the ED he was found to be pulseless and underwent CPR. ROSC after 3-4 rounds, non-shockable rhythm - Epi x3, amiodarone, calcium. Pt had copies projectile emesis after ROSC; agonal respiration; OG dropped before, suctioned, then uncomplicated intubation Non-sustained VT afterwards, followed by sinus tach. Initial BPs with systolic in the 518A. Head CT at OSH showed no acute processes. Telemetry neurology was involved and recommended a CTA; however, was not preformed 2/2 CKD/elevated serum creatinine. [facility] did not have MRI capabilities and the patient was referred for transfer to [facility]. MRI on 4/11 did not indicate acute ischemic or hemorrhagic process and was not c/w anoxic brain injury.    Pertinent  Medical History  CAD s/p DES RCA, HFrEF s/p ICD CKD IV HTN, HLD, T2DM GERD Chronic back pain Fracture right lower extremity occurred on 04/17/2020  Significant Hospital Events: Including  procedures, antibiotic start and stop dates in addition to other pertinent events    4/9 Sz, cardiac arrest  4/10 tx to [facility] care  4/10 nonspecific EEG  4/11 largely unremarkable MRI/MRA 06/03/2020 right internal jugular hemodialysis catheter placed without difficulty>> 06/04/2020 4 mm area of restricted diffusion in the mid right subcortical white matter compatible with acute infarct, not seen on the recent MRI 4/15: Extubated 4/16: Patient was transferred to progressive care, overnight he had lots of secretions, unable to clear, light suctioning patient was noted to be bleeding from upper airway, became unresponsive, and hyperresponsiveness called and transferred to ICU, intubated 4/17 remained intubated, HD line placed, iHD with 500 cc UF 4/18 Remains encephalopathic, iHD with 1L removed 4/19 BUN better, EEG negative, diffuse slowing, Mental status   Interim History / Subjective:  NAEON. Cr worse. mental status no better.   Objective   Blood pressure 123/76, pulse 63, temperature 98.8 F (37.1 C), temperature source Oral, resp. rate 17, height 5\' 9"  (1.753 m), weight 92 kg, SpO2 100 %. CVP:  [8 mmHg-9 mmHg] 9 mmHg  Vent Mode: CPAP;PSV FiO2 (%):  [40 %] 40 % Set Rate:  [16 bmp] 16 bmp Vt Set:  [560 mL] 560 mL PEEP:  [5 cmH20] 5 cmH20 Pressure Support:  [10 cmH20] 10 cmH20 Plateau Pressure:  [14 cmH20-17 cmH20] 14 cmH20   Intake/Output Summary (Last 24 hours) at 06/04/2020 1024 Last data filed at 06/09/2020 0800 Gross per 24 hour  Intake 2619.04 ml  Output 461 ml  Net 2158.04 ml   Filed Weights   06/08/20 1335 06/09/20 0500 05/26/2020 0500  Weight: 90 kg 90.3 kg 92 kg    Examination: General: Chronically ill-appearing male, lying on the bed, orally intubated HEENT: Atraumatic, normocephalic, no JVD, pupils are equal round reactive to light, moist mucous membranes Neuro: Opens eyes with vocal stimuli, not following commands. Minimal movement in bilateral upper  extremities, withdrawing in bilateral lower extremities CV: Regular rate and rhythm, no murmur PULM: Diminished in bilateral bases, no wheezes or rhonchi GI: soft, bsx4 active tube feedings at goal Skin: no rashes or lesions  Labs/imaging that I havepersonally reviewed    chemistries  Resolved Hospital Problem list     Assessment & Plan:   S/p PEA Cardiac Arrest in the setting of hypoxia and seizure Nonsustained ventricular tachycardia Known history of coronary disease and chronic HFrEF increases his risk for sudden cardiac arrest. Cardiac arrest likely 2/2 to seizure versus hypoxia Continue telemetry monitoring - no seizures  Acute hypoxemic respiratory failure Patient was intubated, 4/16 due to unable to clear secretions and protect his airway He was found to be hypoxic and unresponsive. Received multiple doses of Robinul  Continue lung protective ventilation, currently tolerating pressure support but mental status remain poor. --PRVC, PSV trials --VAP protocol --Mental status precludes extubation at this time  Right lower extremity trimalleolar fracture status post Closed reduction 04/17/20. Dr. Arther Abbott orthopedics last examined 04/22/20 [facility] in [place]. Orthopedic follow-up is appreciated Lower extremity cast was removed  Paroxysmal A. fib with RVR Patient converted back to sinus rhythm with controlled rate Continue metoprolol  No AC as hgb low requiring intermittent transfusion  Right lower lobe pneumonia actinobacter Respiratory culture grew actinobacter, pansensitive Ceftriaxone switched to Unasyn 4/17  AKI on CKD IV Patient baseline serum creatinine is around 3 HD catheter placed 4/17, iHD 4/17 and planned 2/95  Acute metabolic encephalopathy: Likely sequela of anoxic injury  Avoid sedation S/p 2 sessions of dialysis, has not improved with reduction of BUN Worried about central issue not reversible  Micro-normocytic Anemia   Thrombocytopenia  Decreased Hemoglobin  Transfuse per protocol. No overt source of bleeding Continue ppx heparin for now  Seizure preceded by acute encephalopathy  Pre-cardiac arrest. Likely provoked seizure 2/2 uncontrolled hyperglycemia. UA negative for ketones. No significant PTA ETOH use. UDS +TCH. 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 LVEF 30% at time of ICD placement in 05/2019.[name] Repeat 2 d echo 4/14 ef 30% CAD s/p DES to RCA 02/2020  Best practice (right click and "Reselect all SmartList Selections" daily)  Diet:  Tube Feed  Pain/Anxiety/Delirium protocol (if indicated): No VAP protocol (if indicated): N/A DVT prophylaxis: LMWH GI prophylaxis: PPI Glucose control:  SSI Yes and Basal insulin Yes Central venous access: Yes, discontinue Arterial line:  N/A Foley:  Yes, and it is no longer needed try condom cath Mobility:  bed rest  PT consulted: N/A Last date of multidisciplinary goals of care discussion [4/19 spouse Tomasa Hosteller at bedside, prognosis is poor with lack of return of normal mentals status now 9 days post arrest ] Code Status:  full code Disposition: ICU   CRITICAL CARE Performed by: Lanier Clam   Total critical care time: 32 minutes  Critical care time was exclusive of separately billable procedures and treating other patients.  Critical care was necessary to treat or prevent imminent or life-threatening deterioration.  Critical care was  time spent personally by me on the following activities: development of treatment plan with patient and/or surrogate as well as nursing, discussions with consultants, evaluation of patient's response to treatment, examination of patient, obtaining history from patient or surrogate, ordering and  performing treatments and interventions, ordering and review of laboratory studies, ordering and review of radiographic studies, pulse oximetry and re-evaluation of patient's condition.

## 2020-06-11 DIAGNOSIS — S82851A Displaced trimalleolar fracture of right lower leg, initial encounter for closed fracture: Secondary | ICD-10-CM | POA: Insufficient documentation

## 2020-06-11 DIAGNOSIS — G931 Anoxic brain damage, not elsewhere classified: Secondary | ICD-10-CM | POA: Diagnosis not present

## 2020-06-11 LAB — CBC
HCT: 24.7 % — ABNORMAL LOW (ref 39.0–52.0)
Hemoglobin: 7.6 g/dL — ABNORMAL LOW (ref 13.0–17.0)
MCH: 25.8 pg — ABNORMAL LOW (ref 26.0–34.0)
MCHC: 30.8 g/dL (ref 30.0–36.0)
MCV: 83.7 fL (ref 80.0–100.0)
Platelets: 340 10*3/uL (ref 150–400)
RBC: 2.95 MIL/uL — ABNORMAL LOW (ref 4.22–5.81)
RDW: 15.3 % (ref 11.5–15.5)
WBC: 16.1 10*3/uL — ABNORMAL HIGH (ref 4.0–10.5)
nRBC: 0.2 % (ref 0.0–0.2)

## 2020-06-11 LAB — BASIC METABOLIC PANEL
Anion gap: 8 (ref 5–15)
BUN: 71 mg/dL — ABNORMAL HIGH (ref 8–23)
CO2: 27 mmol/L (ref 22–32)
Calcium: 7.9 mg/dL — ABNORMAL LOW (ref 8.9–10.3)
Chloride: 105 mmol/L (ref 98–111)
Creatinine, Ser: 3.26 mg/dL — ABNORMAL HIGH (ref 0.61–1.24)
GFR, Estimated: 20 mL/min — ABNORMAL LOW (ref >60)
Glucose, Bld: 152 mg/dL — ABNORMAL HIGH (ref 70–99)
Potassium: 3.5 mmol/L (ref 3.5–5.1)
Sodium: 140 mmol/L (ref 135–145)

## 2020-06-11 LAB — GLUCOSE, CAPILLARY
Glucose-Capillary: 146 mg/dL — ABNORMAL HIGH (ref 70–99)
Glucose-Capillary: 167 mg/dL — ABNORMAL HIGH (ref 70–99)
Glucose-Capillary: 184 mg/dL — ABNORMAL HIGH (ref 70–99)
Glucose-Capillary: 132 mg/dL — ABNORMAL HIGH (ref 70–99)
Glucose-Capillary: 104 mg/dL — ABNORMAL HIGH (ref 70–99)
Glucose-Capillary: 177 mg/dL — ABNORMAL HIGH (ref 70–99)

## 2020-06-11 MED ORDER — HYDROMORPHONE HCL 1 MG/ML IJ SOLN
1.0000 mg | INTRAMUSCULAR | Status: DC | PRN
  Administered 2020-06-11 – 2020-06-12 (×5): 1 mg via INTRAVENOUS
  Filled 2020-06-11 (×5): qty 1

## 2020-06-11 MED ORDER — MORPHINE 100MG IN NS 100ML (1MG/ML) PREMIX INFUSION
1.0000 mg/h | INTRAVENOUS | Status: DC
  Administered 2020-06-11: 1 mg/h via INTRAVENOUS
  Administered 2020-06-12: 10 mg/h via INTRAVENOUS
  Administered 2020-06-12: 15 mg/h via INTRAVENOUS
  Filled 2020-06-11 (×3): qty 100

## 2020-06-11 NOTE — Progress Notes (Signed)
NAME:  Brad Mendoza, MRN:  [id number], DOB:  [birth year], LOS: 47 ADMISSION DATE:  06/14/2020, CONSULTATION DATE:  4/10  REFERRING MD:  Dr. Otelia Limes, CHIEF COMPLAINT:  Cardiac Arrest    History of Present Illness:  Patient is intubated and sedated; therefore history has been obtained from chart review.  Brad Mendoza is a 65 y.o. 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 Patient presented to the [facility] ED on 4/9 with complaints of seizure-like activity and decreased level of consciousness: last normal 1630 with family finding him w/ slurred speech at 1700. As the patient was being transferred into the ambulance he developed agonal respirations and suffered a generalized tonic-clonic seizure. Seizure aborted w/ Versed but patient did wake up from his postictal phase and was bagged in route to the ED. Upon arrival to the ED he was found to be pulseless and underwent CPR. ROSC after 3-4 rounds, non-shockable rhythm - Epi x3, amiodarone, calcium. Pt had copies projectile emesis after ROSC; agonal respiration; OG dropped before, suctioned, then uncomplicated intubation Non-sustained VT afterwards, followed by sinus tach. Initial BPs with systolic in the 865H. Head CT at OSH showed no acute processes. Telemetry neurology was involved and recommended a CTA; however, was not preformed 2/2 CKD/elevated serum creatinine. [facility] did not have MRI capabilities and the patient was referred for transfer to [facility]. MRI on 4/11 did not indicate acute ischemic or hemorrhagic process and was not c/w anoxic brain injury.    Pertinent  Medical History  CAD s/p DES RCA, HFrEF s/p ICD CKD IV HTN, HLD, T2DM GERD Chronic back pain Fracture right lower extremity occurred on 04/17/2020  Significant Hospital Events: Including  procedures, antibiotic start and stop dates in addition to other pertinent events    4/9 Sz, cardiac arrest  4/10 [facility]  4/10 nonspecific EEG  4/11 largely unremarkable MRI/MRA 06/03/2020 right internal jugular hemodialysis catheter placed without difficulty>> 06/04/2020 4 mm area of restricted diffusion in the mid right subcortical white matter compatible with acute infarct, not seen on the recent MRI 4/15: Extubated 4/16: Patient was transferred to progressive care, overnight he had lots of secretions, unable to clear, light suctioning patient was noted to be bleeding from upper airway, became unresponsive, and hyperresponsiveness called and transferred to ICU, intubated 4/17 remained intubated, HD line placed, iHD with 500 cc UF 4/18 Remains encephalopathic, iHD with 1L removed 4/19 BUN better, EEG negative, diffuse slowing, Mental status unchanged 4/20 No change in mental status, ongoing [place] with family, received HD 2L UF 4/21 more movement in UEs, non purposeful   Interim History / Subjective:  NAEON. More active in UEs, does not attend, does not follow commands  Objective   Blood pressure (!) 169/82, pulse 95, temperature 99.2 F (37.3 C), temperature source Axillary, resp. rate (!) 26, height 5\' 9"  (1.753 m), weight 90.4 kg, SpO2 100 %. CVP:  [8 mmHg-20 mmHg] 20 mmHg  Vent Mode: PSV FiO2 (%):  [40 %] 40 % Set Rate:  [16 bmp] 16 bmp Vt Set:  [560 mL] 560 mL PEEP:  [5 cmH20] 5 cmH20 Pressure Support:  [10 cmH20] 10 cmH20 Plateau Pressure:  [13 cmH20-17 cmH20] 17 cmH20   Intake/Output Summary (Last 24 hours) at 06/11/2020 1053 Last data filed at 06/11/2020 0900 Gross  per 24 hour  Intake 2534.59 ml  Output 2250 ml  Net 284.59 ml   Filed Weights   06/02/2020 1540 06/02/2020 1840 06/11/20 0500  Weight: 94 kg 92 kg 90.4 kg    Examination: General: Chronically ill-appearing male, lying on the bed, orally intubated HEENT: Atraumatic, normocephalic, no  JVD, pupils are equal round reactive to light, moist mucous membranes Neuro: Opens eyes with vocal stimuli, not following commands, does not attend. Non-purposeful movement in bilateral upper extremities, non-purposeful movement in in bilateral lower extremities CV: Regular rate and rhythm, no murmur PULM: Diminished in bilateral bases, no wheezes or rhonchi GI: soft, bsx4 active tube feedings at goal Skin: no rashes or lesions  Labs/imaging that I havepersonally reviewed    chemistries  Resolved Hospital Problem list     Assessment & Plan:   S/p PEA Cardiac Arrest in the setting of hypoxia and seizure Nonsustained ventricular tachycardia Known history of coronary disease and chronic HFrEF increases his risk for sudden cardiac arrest. Cardiac arrest likely 2/2 to seizure versus hypoxia Continue telemetry monitoring - no seizures  Acute hypoxemic respiratory failure Patient was intubated, 4/16 due to unable to clear secretions and protect his airway He was found to be hypoxic and unresponsive. Continue lung protective ventilation, currently tolerating pressure support but mental status remain poor. --PRVC, PSV trials --VAP protocol --Mental status precludes extubation at this time  Right lower extremity trimalleolar fracture status post Closed reduction 04/17/20. Dr. Arther Abbott orthopedics last examined 04/22/20 [facility] in [place]. Orthopedic follow-up is appreciated Lower extremity cast was removed  Paroxysmal A. fib with RVR Patient converted back to sinus rhythm with controlled rate Continue metoprolol  No AC as hgb low requiring intermittent transfusion  Right lower lobe pneumonia actinobacter Respiratory culture grew actinobacter, pansensitive Ceftriaxone switched to Unasyn 4/17, plan 7 days of abx  AKI on CKD IV Patient baseline serum creatinine is around 3 HD catheter placed 4/17, iHD 4/17 and planned 9/37  Acute metabolic encephalopathy: Likely  sequela of anoxic injury  Avoid sedation S/p 2 sessions of dialysis, has not improved with reduction of BUN Worried about central issue not reversible  Micro-normocytic Anemia  Thrombocytopenia  Decreased Hemoglobin  Transfuse per protocol. No overt source of bleeding Continue ppx heparin for now  Seizure preceded by acute encephalopathy  Pre-cardiac arrest. Likely provoked seizure 2/2 uncontrolled hyperglycemia. UA negative for ketones. No significant PTA ETOH use. UDS +TCH. 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 LVEF 30% at time of ICD placement in 05/2019.[name] Repeat 2 d echo 4/14 ef 30% CAD s/p DES to RCA 02/2020  Best practice (right click and "Reselect all SmartList Selections" daily)  Diet:  Tube Feed  Pain/Anxiety/Delirium protocol (if indicated): No VAP protocol (if indicated): N/A DVT prophylaxis: LMWH GI prophylaxis: PPI Glucose control:  SSI Yes and Basal insulin Yes Central venous access: Yes, discontinue Arterial line:  N/A Foley:  Yes, and it is no longer needed try condom cath Mobility:  bed rest  PT consulted: N/A Last date of multidisciplinary goals of care discussion [4/19 spouse Tomasa Hosteller at bedside, prognosis is poor with lack of return of normal mentals status now 9 days post arrest ] Code Status:  full code Disposition: ICU   CRITICAL CARE Performed by: Bonna Gains Carmel Waddington   Total critical care time: 60  minutes  Critical care time was exclusive of separately billable procedures and treating other patients.  Critical care was necessary to treat or prevent imminent or life-threatening deterioration.  Critical care was time spent personally by me on the following activities: development of treatment plan with patient and/or surrogate as  well as nursing, discussions with consultants, evaluation of patient's response to treatment, examination of patient, obtaining history from patient or surrogate, ordering and performing treatments and interventions, ordering and review of laboratory studies, ordering and review of radiographic studies, pulse oximetry and re-evaluation of patient's condition.

## 2020-06-11 NOTE — Progress Notes (Signed)
[facility] ROUNDING NOTE   Subjective:   Brief history: This is a 65 year old gentleman with a history of coronary artery disease status post DES stent to RCA.  Diastolic dysfunction.  Status post ICD.  History of diabetes hypertension polysubstance abuse.  CKD stage IIIb/IV with baseline creatinine 01/2020 2.6 to 3 mg/dL.  Recent right lower extremity fracture we will try to find.  Presented with altered mental status.  In ED had a pulseless PEA arrest requiring CPR and ROSC after 3-4 rounds of epinephrine.  Required intubation for airway protection.  Urine drug screen positive for THC.  Underwent dialysis 06/09/2020 with 2 L removed.  Urine output 485 cc   Blood pressure 169/82 pulse 95 temperature 99.  Sodium 140 potassium 3.5 chloride 105 CO2 27 BUN 71 creatinine 3.26 glucose 152 calcium 7.9 hemoglobin 7.6  Chest x-ray: Since 06/07/2020 interval placement left subclavian line no pneumothorax stable cardiomegaly subsegmental atelectasis on the left lung     Objective:  Vital signs in last 24 hours:  Temp:  [96.7 F (35.9 C)-99.6 F (37.6 C)] 99.2 F (37.3 C) (04/21 0743) Pulse Rate:  [64-95] 95 (04/21 0933) Resp:  [12-26] 26 (04/21 0933) BP: (112-182)/(52-87) 169/82 (04/21 0933) SpO2:  [97 %-100 %] 100 % (04/21 0933) FiO2 (%):  [40 %] 40 % (04/21 0735) Weight:  [90.4 kg-94 kg] 90.4 kg (04/21 0500)  Weight change: 2 kg Filed Weights   05/30/2020 1540 06/01/2020 1840 06/11/20 0500  Weight: 94 kg 92 kg 90.4 kg    Intake/Output: I/O last 3 completed shifts: In: 4039 [I.V.:334; NG/GT:3705] Out: 2710 [Urine:710; Other:2000]   Intake/Output this shift:  Total I/O In: 395 [I.V.:40; NG/GT:355] Out: -   General: NAD,intubated HEENT: ETT, moist mucous membranes Lungs: Bilateral chest rise with no increased work of breathing Cardiovascular:RRR,no JVD left subclavian catheter Abdomen: soft, non-distended Extremities: tredema noted in left lower extremity, cast  on right lower extremity   Basic Metabolic Panel: Recent Labs  Lab 06/05/20 0322 06/05/20 2308 06/06/20 0107 06/07/20 0210 06/07/20 0228 06/08/20 0220 06/09/20 0342 06/09/2020 0010 06/11/20 0354  NA 143   < > 148*   < > 148* 143 138 136 140  K 4.4   < > 4.0   < > 3.8 3.6 3.3* 3.6 3.5  CL 109  --  112*  --  113* 106 103 102 105  CO2 24  --  22  --  23 25 27 23 27   GLUCOSE 134*  --  94  --  129* 168* 166* 211* 152*  BUN 138*  --  128*  --  150* 96* 87* 100* 71*  CREATININE 3.74*  --  3.44*  --  3.74* 2.78* 3.28* 4.06* 3.26*  CALCIUM 8.1*  --  8.7*  --  8.4* 8.3* 7.8* 7.9* 7.9*  MG 2.6*  --  2.7*  --  2.9* 2.5*  --   --   --   PHOS 5.1*  --  3.8  --  3.8  --   --   --   --    < > = values in this interval not displayed.    Liver Function Tests: Recent Labs  Lab 06/05/20 0322 06/06/20 0107 06/07/20 0228 06/09/20 0342  AST 62* 53* 48* 49*  ALT 51* 50* 44 51*  ALKPHOS 208* 254* 215* 235*  BILITOT 0.5 0.5 0.4 0.6  PROT 4.9* 5.9* 5.5* 5.0*  ALBUMIN 1.4* 2.1* 1.9* 1.7*   No results for input(s): LIPASE, AMYLASE in  the last 168 hours. No results for input(s): AMMONIA in the last 168 hours.  CBC: Recent Labs  Lab 06/05/20 0322 06/05/20 2308 06/06/20 0107 06/07/20 0210 06/07/20 0228 06/08/20 0220 06/08/20 1024 06/09/20 0342 06/11/20 0354  WBC 8.1  --  13.4*  --  10.8* 12.1*  --  13.9* 16.1*  NEUTROABS 5.9  --  11.6*  --   --  9.4*  --   --   --   HGB 7.1*   < > 7.9*   < > 7.0* 6.9* 7.0* 7.1* 7.6*  HCT 23.1*   < > 25.4*   < > 22.5* 22.6* 22.7* 23.5* 24.7*  MCV 84.6  --  82.7  --  82.4 85.0  --  83.9 83.7  PLT 169  --  255  --  245 293  --  279 340   < > = values in this interval not displayed.    Cardiac Enzymes: No results for input(s): CKTOTAL, CKMB, CKMBINDEX, TROPONINI in the last 168 hours.  BNP: Invalid input(s): POCBNP  CBG: Recent Labs  Lab 06/14/2020 1517 06/05/2020 1936 06/14/2020 2317 06/11/20 0323 06/11/20 0727  GLUCAP 221* 164* 160* 146* 167*     Microbiology: Results for orders placed or performed during the hospital encounter of 05/30/2020  MRSA PCR Screening     Status: Abnormal   Collection Time: 06/01/2020  1:52 AM   Specimen: Nasal Mucosa; Nasopharyngeal  Result Value Ref Range Status   MRSA by PCR (A) NEGATIVE Final    INVALID, UNABLE TO DETERMINE THE PRESENCE OF TARGET DUE TO SPECIMEN INTEGRITY. RECOLLECTION REQUESTED.    Comment: NOTIFIED SIERRA M RN @0946  05/25/2020 EB Performed at [facility] [address] [place], [place] [address]   Culture, respiratory (tracheal aspirate)     Status: None   Collection Time: 06/17/2020  3:30 AM   Specimen: Tracheal Aspirate; Respiratory  Result Value Ref Range Status   Specimen Description TRACHEAL ASPIRATE  Final   Special Requests Normal  Final   Gram Stain   Final    RARE WBC PRESENT, PREDOMINANTLY PMN MODERATE GRAM POSITIVE COCCI IN CHAINS    Culture   Final    ABUNDANT Normal respiratory flora-no Staph aureus or Pseudomonas seen Performed at [facility], [address] [place], [place] [address]    Report Status 06/02/2020 FINAL  Final  Culture, blood (routine x 2)     Status: None   Collection Time: 06/07/2020  3:33 AM   Specimen: BLOOD  Result Value Ref Range Status   Specimen Description BLOOD RIGHT ARM  Final   Special Requests   Final    BOTTLES DRAWN AEROBIC ONLY Blood Culture adequate volume   Culture   Final    NO GROWTH 5 DAYS Performed at [facility], [place] [address], [place], [place] [address]    Report Status 06/05/2020 FINAL  Final  Culture, blood (routine x 2)     Status: None   Collection Time: 06/03/2020  3:50 AM   Specimen: BLOOD  Result Value Ref Range Status   Specimen Description BLOOD RIGHT ARM  Final   Special Requests   Final    BOTTLES DRAWN AEROBIC ONLY Blood Culture adequate volume   Culture   Final    NO GROWTH 5 DAYS Performed at [facility], [address], [place], [place] [address]    Report Status  06/05/2020 FINAL  Final  MRSA PCR Screening  Status: None   Collection Time: 05/26/2020 12:07 PM   Specimen: Nasal Mucosa; Nasopharyngeal  Result Value Ref Range Status   MRSA by PCR NEGATIVE NEGATIVE Final    Comment:        The GeneXpert MRSA Assay (FDA approved for NASAL specimens only), is one component of a comprehensive MRSA colonization surveillance program. It is not intended to diagnose MRSA infection nor to guide or monitor treatment for MRSA infections. Performed at [facility], [place] [address], [place], [place] [address]   Culture, Respiratory w Gram Stain     Status: None   Collection Time: 06/05/20  4:04 PM   Specimen: Tracheal Aspirate; Respiratory  Result Value Ref Range Status   Specimen Description TRACHEAL ASPIRATE  Final   Special Requests NONE  Final   Gram Stain   Final    NO WBC SEEN RARE GRAM NEGATIVE RODS Performed at [facility], [place] [address] [place], [place] [address]    Culture FEW ACINETOBACTER CALCOACETICUS/BAUMANNII COMPLEX  Final   Report Status 06/07/2020 FINAL  Final   Organism ID, Bacteria ACINETOBACTER CALCOACETICUS/BAUMANNII COMPLEX  Final      Susceptibility   Acinetobacter calcoaceticus/baumannii complex - MIC*    CEFTAZIDIME 4 SENSITIVE Sensitive     CIPROFLOXACIN <=0.25 SENSITIVE Sensitive     GENTAMICIN <=1 SENSITIVE Sensitive     IMIPENEM <=0.25 SENSITIVE Sensitive     PIP/TAZO <=4 SENSITIVE Sensitive     TRIMETH/SULFA <=20 SENSITIVE Sensitive     AMPICILLIN/SULBACTAM <=2 SENSITIVE Sensitive     * FEW ACINETOBACTER CALCOACETICUS/BAUMANNII COMPLEX    Coagulation Studies: No results for input(s): LABPROT, INR in the last 72 hours.  Urinalysis: No results for input(s): COLORURINE, LABSPEC, PHURINE, GLUCOSEU, HGBUR, BILIRUBINUR, KETONESUR, PROTEINUR, UROBILINOGEN, NITRITE, LEUKOCYTESUR in the last 72 hours.  Invalid input(s): APPERANCEUR    Imaging: No results found.   Medications:   . sodium chloride  100 mL (06/11/20 0919)  . sodium chloride    . sodium chloride 10 mL/hr at 06/11/20 0800  . ampicillin-sulbactam (UNASYN) IV 3 g (06/11/20 0038)  . feeding supplement (VITAL AF 1.2 CAL) 85 mL/hr at 06/11/20 0903   . sodium chloride   Intravenous Once  . sodium chloride   Intravenous Once  . aspirin  81 mg Per Tube Daily  . budesonide (PULMICORT) nebulizer solution  0.5 mg Nebulization BID  . chlorhexidine gluconate (MEDLINE KIT)  15 mL Mouth Rinse BID  . Chlorhexidine Gluconate Cloth  6 each Topical Q0600  . Chlorhexidine Gluconate Cloth  6 each Topical Q0600  . free water  100 mL Per Tube Q4H  . heparin injection (subcutaneous)  5,000 Units Subcutaneous Q8H  . insulin aspart  0-20 Units Subcutaneous Q4H  . insulin glargine  15 Units Subcutaneous BID  . mouth rinse  15 mL Mouth Rinse 10 times per day  . metoprolol tartrate  50 mg Per Tube Q8H  . pantoprazole sodium  40 mg Per Tube QHS  . polyethylene glycol  17 g Per Tube Daily  . rosuvastatin  10 mg Per Tube Daily  . sodium chloride flush  10-40 mL Intracatheter Q12H   sodium chloride, sodium chloride, sodium chloride, acetaminophen (TYLENOL) oral liquid 160 mg/5 mL, alteplase, docusate, heparin, heparin sodium (porcine), hydrALAZINE, ipratropium-albuterol, lidocaine (PF), lidocaine-prilocaine, ondansetron (ZOFRAN) IV, pentafluoroprop-tetrafluoroeth, polyethylene glycol, sodium chloride flush  Assessment/ Plan:  1. AKI or CKDIIIb/IV with a progression in his CKD with BL cr already 2.6-3 01/2020 presumably secondary to DM +  HTN. He went through 3-4 rounds of CPR requiring Epi x3, amiodarone and Ca + intubation.  Renal ultrasound reassuring and urine microscopy was also okay but likely suffered ATN. -Renal parameters were improving   but now with reintubation his mental status  worsened concurrent with a rising BUN.    Dialysis initiated due to concerns of uremia contributing to altered mental state. -Underwent dialysis 4/17, 4/18, 4/20  creatinine continues to rise.    He is making urine although I do not see much return in renal function.  I anticipate that dialysis will be needed 07-12-2020  2. HTN - controlled 3. DM 4. S/p cardiac arrest - not clear the etiology but he has a h/o HFrEF and CASHD. Neurology following 5. Hypernatremia: Resolved 6. Hypokalemia we will continue to follow    LOS: [place] @TODAY @10 :04 AM

## 2020-06-11 NOTE — Progress Notes (Signed)
Plan of care confirmed with wife, patient is DNR. We will keep patient comfortable with pain management tonight (PRN dilaudid ordered), she would like to be here when we proceed with palliative extubation. Plan will be for palliative extubation in am when she arrives.

## 2020-06-11 NOTE — Significant Event (Signed)
Met with family including wife and daughter. Discussed current state - encephalopathy in setting of cardiac arrest 11 days ago. Has had no return of purposeful neurologic function. Does not attend, track, follow commands. No seizure on EEG. HD x 3 with improvement in BUN but no improvement in mental status. Do not anticipate any additional recovery. Best case is trach dialysis and institutional care for rest of life. He will likely end up placed out of state. He will never do the things he enjoys. All agree that is not an acceptable quality of life and he would not want to go on like this. Agree with withdrawal of life support and a peaceful, natural death. Discussed this can be achieved with comfort measures. They would like to visit with patient. Anticipate pursuing comfort measures this evening. Code status changed to DNR.

## 2020-06-11 NOTE — Progress Notes (Signed)
RN spoke with daughter, Mateo Flow on the phone and she is ready to transition to comfort care and does not want to be with patient at time of extubation.   I called the wife, Jeanett Schlein with no answer to confirm that she is okay with transitioning to comfort measures at this point.  RN paged Eliseo Gum, NP who will try to reach out to the wife, Jeanett Schlein again before placing full comfort care orders.   Dewaine Oats

## 2020-06-11 NOTE — Progress Notes (Signed)
eLink Physician-Brief Progress Note Patient Name: Brad Mendoza DOB: 06/16/1955 MRN: [id number]   Date of Service  06/11/2020  HPI/Events of Note  Pt being terminally extubated in a.m. & transitioning to comfort care.  Note states pain control & keep pt. comfortable for tonight.  RN reports pt. has eyes open, gagging on tube, looks very uncomfortable.  Hypertensive 173/75 (101), HR 88.  eICU Interventions  Plan: 1. Will add Morphine IV infusion. Titrate to patient comfort.      Intervention Category Major Interventions: Delirium, psychosis, severe agitation - evaluation and management  Lysle Dingwall 06/11/2020, 10:34 PM

## 2020-06-12 DIAGNOSIS — G931 Anoxic brain damage, not elsewhere classified: Secondary | ICD-10-CM | POA: Diagnosis not present

## 2020-06-12 LAB — GLUCOSE, CAPILLARY
Glucose-Capillary: 175 mg/dL — ABNORMAL HIGH (ref 70–99)
Glucose-Capillary: 151 mg/dL — ABNORMAL HIGH (ref 70–99)

## 2020-06-12 MED ORDER — LORAZEPAM 2 MG/ML IJ SOLN
2.0000 mg | INTRAMUSCULAR | Status: DC | PRN
  Administered 2020-06-12 (×2): 2 mg via INTRAVENOUS
  Filled 2020-06-12 (×2): qty 1

## 2020-06-12 MED ORDER — CHLORHEXIDINE GLUCONATE 0.12 % MT SOLN
OROMUCOSAL | Status: AC
  Filled 2020-06-12: qty 15

## 2020-06-12 MED ORDER — MORPHINE SULFATE (PF) 2 MG/ML IV SOLN
4.0000 mg | INTRAVENOUS | Status: DC | PRN
  Administered 2020-06-12 (×3): 4 mg via INTRAVENOUS

## 2020-06-15 ENCOUNTER — Ambulatory Visit: Payer: Medicare Other | Admitting: Orthopedic Surgery

## 2020-06-15 DIAGNOSIS — S82851D Displaced trimalleolar fracture of right lower leg, subsequent encounter for closed fracture with routine healing: Secondary | ICD-10-CM

## 2020-06-16 ENCOUNTER — Ambulatory Visit: Payer: Medicare Other | Admitting: Student

## 2020-06-21 NOTE — Procedures (Signed)
Extubation Procedure Note  Patient Details:   Name: Brad Mendoza DOB: 10-May-1955 MRN: [id number]   Airway Documentation:    Vent end date: Jun 24, 2020 Vent end time: 1450   Evaluation  O2 sats: currently acceptable Complications: No apparent complications Patient did tolerate procedure well. Bilateral Breath Sounds: Diminished   No  One way extubation  Shawntel Farnworth 06-24-20, 3:05 PM

## 2020-06-21 NOTE — Progress Notes (Signed)
[facility] ROUNDING NOTE   Subjective:   Brief history: This is a 66 year old gentleman with a history of coronary artery disease status post DES stent to RCA.  Diastolic dysfunction.  Status post ICD.  History of diabetes hypertension polysubstance abuse.  CKD stage IIIb/IV with baseline creatinine 01/2020 2.6 to 3 mg/dL.  Recent right lower extremity fracture we will try to find.  Presented with altered mental status.  In ED had a pulseless PEA arrest requiring CPR and ROSC after 3-4 rounds of epinephrine.  Required intubation for airway protection.  Urine drug screen positive for THC.  Underwent dialysis 05/26/2020 with 2 L removed.  Urine output 485 cc   Blood pressure 108/57 pulse 63 temperature 97.8 O2 sats 100% FiO2 40%.  Urine output 450 cc  Sodium 140 potassium 3.5 chloride 105 CO2 27 BUN 71 creatinine 3.26 glucose 152 calcium 7.9 hemoglobin 7.6  Chest x-ray: Since 06/07/2020 interval placement left subclavian line no pneumothorax stable cardiomegaly subsegmental atelectasis on the left lung     Objective:  Vital signs in last 24 hours:  Temp:  [97.8 F (36.6 C)-99.1 F (37.3 C)] 97.8 F (36.6 C) (04/22 0742) Pulse Rate:  [67-111] 69 (04/22 0800) Resp:  [15-27] 16 (04/22 0800) BP: (99-184)/(44-90) 128/57 (04/22 0800) SpO2:  [96 %-100 %] 99 % (04/22 0800) FiO2 (%):  [40 %] 40 % (04/22 0800)  Weight change:  Filed Weights   06/13/2020 1540 05/25/2020 1840 06/11/20 0500  Weight: 94 kg 92 kg 90.4 kg    Intake/Output: I/O last 3 completed shifts: In: 3681.4 [I.V.:197.8; NG/GT:3483.7] Out: 450 [Urine:450]   Intake/Output this shift:  Total I/O In: 28.9 [I.V.:28.9] Out: 300 [Stool:300]  General: NAD,intubated HEENT: ETT, moist mucous membranes Lungs: Bilateral chest rise with no increased work of breathing Cardiovascular:RRR,no JVD left subclavian catheter Abdomen: soft, non-distended Extremities: tredema noted in left lower extremity, cast on right  lower extremity   Basic Metabolic Panel: Recent Labs  Lab 06/06/20 0107 06/07/20 0210 06/07/20 0228 06/08/20 0220 06/09/20 0342 06/02/2020 0010 06/11/20 0354  NA 148*   < > 148* 143 138 136 140  K 4.0   < > 3.8 3.6 3.3* 3.6 3.5  CL 112*  --  113* 106 103 102 105  CO2 22  --  23 25 27 23 27   GLUCOSE 94  --  129* 168* 166* 211* 152*  BUN 128*  --  150* 96* 87* 100* 71*  CREATININE 3.44*  --  3.74* 2.78* 3.28* 4.06* 3.26*  CALCIUM 8.7*  --  8.4* 8.3* 7.8* 7.9* 7.9*  MG 2.7*  --  2.9* 2.5*  --   --   --   PHOS 3.8  --  3.8  --   --   --   --    < > = values in this interval not displayed.    Liver Function Tests: Recent Labs  Lab 06/06/20 0107 06/07/20 0228 06/09/20 0342  AST 53* 48* 49*  ALT 50* 44 51*  ALKPHOS 254* 215* 235*  BILITOT 0.5 0.4 0.6  PROT 5.9* 5.5* 5.0*  ALBUMIN 2.1* 1.9* 1.7*   No results for input(s): LIPASE, AMYLASE in the last 168 hours. No results for input(s): AMMONIA in the last 168 hours.  CBC: Recent Labs  Lab 06/06/20 0107 06/07/20 0210 06/07/20 0228 06/08/20 0220 06/08/20 1024 06/09/20 0342 06/11/20 0354  WBC 13.4*  --  10.8* 12.1*  --  13.9* 16.1*  NEUTROABS 11.6*  --   --  9.4*  --   --   --   HGB 7.9*   < > 7.0* 6.9* 7.0* 7.1* 7.6*  HCT 25.4*   < > 22.5* 22.6* 22.7* 23.5* 24.7*  MCV 82.7  --  82.4 85.0  --  83.9 83.7  PLT 255  --  245 293  --  279 340   < > = values in this interval not displayed.    Cardiac Enzymes: No results for input(s): CKTOTAL, CKMB, CKMBINDEX, TROPONINI in the last 168 hours.  BNP: Invalid input(s): POCBNP  CBG: Recent Labs  Lab 06/11/20 1508 06/11/20 1958 06/11/20 2324 06/14/2020 0354 06/14/2020 0744  GLUCAP 132* 104* 177* 175* 151*    Microbiology: Results for orders placed or performed during the hospital encounter of 06/06/2020  MRSA PCR Screening     Status: Abnormal   Collection Time: 06/11/2020  1:52 AM   Specimen: Nasal Mucosa; Nasopharyngeal  Result Value Ref Range Status   MRSA by PCR  (A) NEGATIVE Final    INVALID, UNABLE TO DETERMINE THE PRESENCE OF TARGET DUE TO SPECIMEN INTEGRITY. RECOLLECTION REQUESTED.    Comment: NOTIFIED SIERRA M RN @0946  05/23/2020 EB Performed at [facility] [address] [place], [place] [address]   Culture, respiratory (tracheal aspirate)     Status: None   Collection Time: 05/23/2020  3:30 AM   Specimen: Tracheal Aspirate; Respiratory  Result Value Ref Range Status   Specimen Description TRACHEAL ASPIRATE  Final   Special Requests Normal  Final   Gram Stain   Final    RARE WBC PRESENT, PREDOMINANTLY PMN MODERATE GRAM POSITIVE COCCI IN CHAINS    Culture   Final    ABUNDANT Normal respiratory flora-no Staph aureus or Pseudomonas seen Performed at [facility], [address] [place],  [place] [address]    Report Status 06/02/2020 FINAL  Final  Culture, blood (routine x 2)     Status: None   Collection Time: 06/14/2020  3:33 AM   Specimen: BLOOD  Result Value Ref Range Status   Specimen Description BLOOD RIGHT ARM  Final   Special Requests   Final    BOTTLES DRAWN AEROBIC ONLY Blood Culture adequate volume   Culture   Final    NO GROWTH 5 DAYS Performed at [facility], [place] [address], [place], [place] [address]    Report Status 06/05/2020 FINAL  Final  Culture, blood (routine x 2)     Status: None   Collection Time: 06/03/2020  3:50 AM   Specimen: BLOOD  Result Value Ref Range Status   Specimen Description BLOOD RIGHT ARM  Final   Special Requests   Final    BOTTLES DRAWN AEROBIC ONLY Blood Culture adequate volume   Culture   Final    NO GROWTH 5 DAYS Performed at [facility], [address] [place], [place] [address]    Report Status 06/05/2020 FINAL  Final  MRSA PCR Screening     Status: None   Collection Time: 05/27/2020 12:07 PM   Specimen: Nasal Mucosa; Nasopharyngeal  Result Value Ref Range Status   MRSA by PCR NEGATIVE NEGATIVE Final    Comment:        The GeneXpert MRSA Assay (FDA approved for  NASAL specimens only), is one component of a comprehensive MRSA colonization surveillance program. It is not intended to diagnose MRSA infection nor to guide or monitor treatment for MRSA infections. Performed at [facility], [place]  [address] [place], [place] [address]   Culture, Respiratory w Gram Stain     Status: None   Collection Time: 06/05/20  4:04 PM   Specimen: Tracheal Aspirate; Respiratory  Result Value Ref Range Status   Specimen Description TRACHEAL ASPIRATE  Final   Special Requests NONE  Final   Gram Stain   Final    NO WBC SEEN RARE GRAM NEGATIVE RODS Performed at [facility], [place] [address] [place], [place] [address]    Culture FEW ACINETOBACTER CALCOACETICUS/BAUMANNII COMPLEX  Final   Report Status 06/07/2020 FINAL  Final   Organism ID, Bacteria ACINETOBACTER CALCOACETICUS/BAUMANNII COMPLEX  Final      Susceptibility   Acinetobacter calcoaceticus/baumannii complex - MIC*    CEFTAZIDIME 4 SENSITIVE Sensitive     CIPROFLOXACIN <=0.25 SENSITIVE Sensitive     GENTAMICIN <=1 SENSITIVE Sensitive     IMIPENEM <=0.25 SENSITIVE Sensitive     PIP/TAZO <=4 SENSITIVE Sensitive     TRIMETH/SULFA <=20 SENSITIVE Sensitive     AMPICILLIN/SULBACTAM <=2 SENSITIVE Sensitive     * FEW ACINETOBACTER CALCOACETICUS/BAUMANNII COMPLEX    Coagulation Studies: No results for input(s): LABPROT, INR in the last 72 hours.  Urinalysis: No results for input(s): COLORURINE, LABSPEC, PHURINE, GLUCOSEU, HGBUR, BILIRUBINUR, KETONESUR, PROTEINUR, UROBILINOGEN, NITRITE, LEUKOCYTESUR in the last 72 hours.  Invalid input(s): APPERANCEUR    Imaging: No results found.   Medications:   . feeding supplement (VITAL AF 1.2 CAL) 1,000 mL (07-11-20 0550)  . morphine 10 mg/hr (2020-07-11 1013)   . sodium chloride   Intravenous Once  . sodium chloride   Intravenous Once   LORazepam, morphine injection, ondansetron (ZOFRAN) IV  Assessment/ Plan:  1. AKI or CKDIIIb/IV with a  progression in his CKD with BL cr already 2.6-3 01/2020 presumably secondary to DM + HTN. He went through 3-4 rounds of CPR requiring Epi x3, amiodarone and Ca + intubation.  Renal ultrasound reassuring and urine microscopy was also okay but likely suffered ATN. -Renal parameters were improving   but now with reintubation his mental status  worsened concurrent with a rising BUN.    Dialysis initiated due to concerns of uremia contributing to altered mental state. -Underwent dialysis 4/17, 4/18, 4/20 creatinine continues to rise.    We will check labs today and evaluate needs for dialysis.   2. HTN - controlled 3. DM 4. S/p cardiac arrest - not clear the etiology but he has a h/o HFrEF and CASHD. Neurology following 5. Hypernatremia: Resolved 6. Hypokalemia we will continue to follow    LOS: [place] @TODAY @10 :53 AM

## 2020-06-21 NOTE — Progress Notes (Signed)
NAME:  Brad Mendoza, MRN:  [id number], DOB:  04-27-1955, LOS: 54 ADMISSION DATE:  06/15/2020, CONSULTATION DATE:  4/10  REFERRING MD:  Dr. Otelia Limes, CHIEF COMPLAINT:  Cardiac Arrest    History of Present Illness:  Patient is intubated and sedated; therefore history has been obtained from chart review.  ORLIE Mendoza is a 65 y.o. 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 Patient presented to the [facility] ED on 4/9 with complaints of seizure-like activity and decreased level of consciousness: last normal 1630 with family finding him w/ slurred speech at 1700. As the patient was being transferred into the ambulance he developed agonal respirations and suffered a generalized tonic-clonic seizure. Seizure aborted w/ Versed but patient did wake up from his postictal phase and was bagged in route to the ED. Upon arrival to the ED he was found to be pulseless and underwent CPR. ROSC after 3-4 rounds, non-shockable rhythm - Epi x3, amiodarone, calcium. Pt had copies projectile emesis after ROSC; agonal respiration; OG dropped before, suctioned, then uncomplicated intubation Non-sustained VT afterwards, followed by sinus tach. Initial BPs with systolic in the 366Y. Head CT at OSH showed no acute processes. Telemetry neurology was involved and recommended a CTA; however, was not preformed 2/2 CKD/elevated serum creatinine. [facility] did not have MRI capabilities and the patient was referred for transfer to [facility]. MRI on 4/11 did not indicate acute ischemic or hemorrhagic process and was not c/w anoxic brain injury.    Pertinent  Medical History  CAD s/p DES RCA, HFrEF s/p ICD CKD IV HTN, HLD, T2DM GERD Chronic back pain Fracture right lower extremity occurred on 04/17/2020  Significant Hospital Events: Including  procedures, antibiotic start and stop dates in addition to other pertinent events    4/9 Sz, cardiac arrest  4/10 tx to Brad Mendoza for ICU care  4/10 nonspecific EEG  4/11 largely unremarkable MRI/MRA 06/03/2020 right internal jugular hemodialysis catheter placed without difficulty>> 06/04/2020 4 mm area of restricted diffusion in the mid right subcortical white matter compatible with acute infarct, not seen on the recent MRI 4/15: Extubated 4/16: Patient was transferred to progressive care, overnight he had lots of secretions, unable to clear, light suctioning patient was noted to be bleeding from upper airway, became unresponsive, and hyperresponsiveness called and transferred to ICU, intubated 4/17 remained intubated, HD line placed, iHD with 500 cc UF 4/18 Remains encephalopathic, iHD with 1L removed 4/19 BUN better, EEG negative, diffuse slowing, Mental status unchanged 4/20 No change in mental status, ongoing [place] with family, received HD 2L UF 4/21 more movement in UEs, non purposeful, family meeting to pursue comfort care   Interim History / Subjective:  NAEON.   Objective   Blood pressure (!) 128/57, pulse 69, temperature 97.8 F (36.6 C), temperature source Oral, resp. rate 16, height 5\' 9"  (1.753 m), weight 90.4 kg, SpO2 99 %.    Vent Mode: PRVC FiO2 (%):  [40 %] 40 % Set Rate:  [16 bmp] 16 bmp Vt Set:  [560 mL] 560 mL PEEP:  [5 cmH20] 5 cmH20 Pressure Support:  [10 cmH20] 10 cmH20 Plateau Pressure:  [12 cmH20-16 cmH20] 16 cmH20   Intake/Output Summary (Last 24 hours) at 07-03-20 0937 Last data filed at 07/03/20 0800 Gross per 24 hour  Intake 2004.77 ml  Output 450  ml  Net 1554.77 ml   Filed Weights   06/18/2020 1540 06/15/2020 1840 06/11/20 0500  Weight: 94 kg 92 kg 90.4 kg    Examination: General: Chronically ill-appearing male, lying on the bed, orally intubated HEENT: Atraumatic, normocephalic, no JVD, pupils are equal round reactive to light, moist mucous  membranes Neuro: Opens eyes with vocal stimuli, not following commands, does not attend. Non-purposeful movement in bilateral upper extremities, non-purposeful movement in in bilateral lower extremities CV: Regular rate and rhythm, no murmur PULM: Diminished in bilateral bases, no wheezes or rhonchi GI: soft, bsx4 active tube feedings at goal Skin: no rashes or lesions  Labs/imaging that I havepersonally reviewed    Labs and images  Resolved Hospital Problem list     Assessment & Plan:   S/p PEA Cardiac Arrest in the setting of hypoxia and seizure Nonsustained ventricular tachycardia Known history of coronary disease and chronic HFrEF increases his risk for sudden cardiac arrest. Cardiac arrest likely 2/2 to seizure versus hypoxia  Acute hypoxemic respiratory failure Patient was intubated, 4/16 due to unable to clear secretions and protect his airway He was found to be hypoxic and unresponsive. Continue lung protective ventilation, currently tolerating pressure support but mental status remain poor. --PRVC, palliative extubation later today  Right lower extremity trimalleolar fracture status post Closed reduction 04/17/20. Dr. Arther Abbott orthopedics last examined 04/22/20 [facility] in [place].  Paroxysmal A. 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 with LVEF 30% at time of ICD placement in 05/2019.[name] Repeat 2 d echo 4/14 ef 30% CAD s/p DES to RCA 02/2020  Best practice (right click and "Reselect all SmartList Selections" daily)  Diet:  Tube Feed   will stop for comfort Pain/Anxiety/Delirium protocol (if indicated): No VAP protocol (if indicated): N/A DVT prophylaxis: LMWH GI prophylaxis: PPI Glucose control:  SSI Yes and Basal insulin Yes Central venous access: Yes, discontinue Arterial line:  N/A Foley:  Yes, and it is no longer needed try condom cath Mobility:  bed rest  PT consulted: N/A Last date of multidisciplinary goals of care discussion see sig event note 4/21 Code Status:  full code Disposition: ICU   CRITICAL CARE n/a

## 2020-06-21 NOTE — Progress Notes (Signed)
Nutrition Brief Note  Chart reviewed. Patient transitioning to comfort care with plans for palliative extubation today. No further nutrition interventions planned at this time.  Please re-consult as needed.   Lucas Mallow, RD, LDN, CNSC Please refer to [facility] for contact information.

## 2020-06-21 DEATH — deceased

## 2020-07-14 DIAGNOSIS — N179 Acute kidney failure, unspecified: Secondary | ICD-10-CM

## 2020-07-14 DIAGNOSIS — Z66 Do not resuscitate: Secondary | ICD-10-CM

## 2020-07-14 DIAGNOSIS — Z515 Encounter for palliative care: Secondary | ICD-10-CM

## 2020-07-22 NOTE — Death Summary Note (Signed)
  DEATH SUMMARY   Patient Details  Name: Brad Mendoza MRN: [id number] DOB: 05-12-1955  Admission/Discharge Information   Admit Date:  17-Jun-2020  Date of Death: Date of Death: 06-29-2020  Time of Death: Time of Death: 43  Length of Stay: 20-May-2022  Referring Physician: Patient, No Pcp Per (Inactive)   Reason(s) for Hospitalization  cardiac arrest  Diagnoses  Preliminary cause of death: anoxic brain injury Secondary Diagnoses (including complications and co-morbidities):  Principal Problem:   Anoxic encephalopathy (Brad Mendoza) Active Problems:   Coronary artery disease involving native coronary artery of native heart with angina pectoris ([name])   Type 2 diabetes mellitus with diabetic polyneuropathy (HCC)   Chronic kidney disease, stage IV (severe) (HCC)   Hyperosmolar (nonketotic) coma (HCC)   Endotracheally intubated   Lactic acidosis   Syncope   Seizure ([name])   Cardiac arrest ([name])   Acute respiratory failure with hypoxia ([name])   Encounter for central line placement   DNR (do not resuscitate)   Comfort measures only status   Acute renal failure (ARF) [name]   Brief Hospital Course (including significant findings, care, treatment, and services provided and events leading to death)  Brad Mendoza is a 65 y.o. year old male who was admitted to the hospital following cardiac arrest. Course c/b seizure adequately treated with AEDs. He remained encephalopathic, not following commands. He was extubated once but re-intubated later in the day due to inability to protect airway and aspiration event. He developed renal failure on CKD4/5. Dialysis was initiated to see if uremia was contributing to encephalopathy. Mental status did not improve. Felt to be related  To anoxic brain injury from cardiac arrest. He would at most withdraw to pain and have non purposeful movements of extremities. After discussing with family, it was decided he would not want to live on machines. He was extubated and placed  on comfort measures. He subsequentlyt died peacefully at approximately 1800 on Jun 30, 2022.    Pertinent Labs and Studies  Significant Diagnostic Studies No results found.  Microbiology No results found for this or any previous visit (from the past 240 hour(s)).  Lab Basic Metabolic Panel: No results for input(s): NA, K, CL, CO2, GLUCOSE, BUN, CREATININE, CALCIUM, MG, PHOS in the last 168 hours. Liver Function Tests: No results for input(s): AST, ALT, ALKPHOS, BILITOT, PROT, ALBUMIN in the last 168 hours. No results for input(s): LIPASE, AMYLASE in the last 168 hours. No results for input(s): AMMONIA in the last 168 hours. CBC: No results for input(s): WBC, NEUTROABS, HGB, HCT, MCV, PLT in the last 168 hours. Cardiac Enzymes: No results for input(s): CKTOTAL, CKMB, CKMBINDEX, TROPONINI in the last 168 hours. Sepsis Labs: No results for input(s): PROCALCITON, WBC, LATICACIDVEN in the last 168 hours.  Procedures/Operations  As per EMR   Bonna Gains Saraphina Lauderbaugh 07/14/2020, 10:24 AM

## 2023-01-23 IMAGING — DX DG ANKLE 2V *R*
2 series · 2 of 2 positions shown · non-contrast
Comparison: Same day.

CLINICAL DATA: Status post reduction of right ankle fracture.

EXAM:
RIGHT ANKLE - 2 VIEW

[ankle ap]
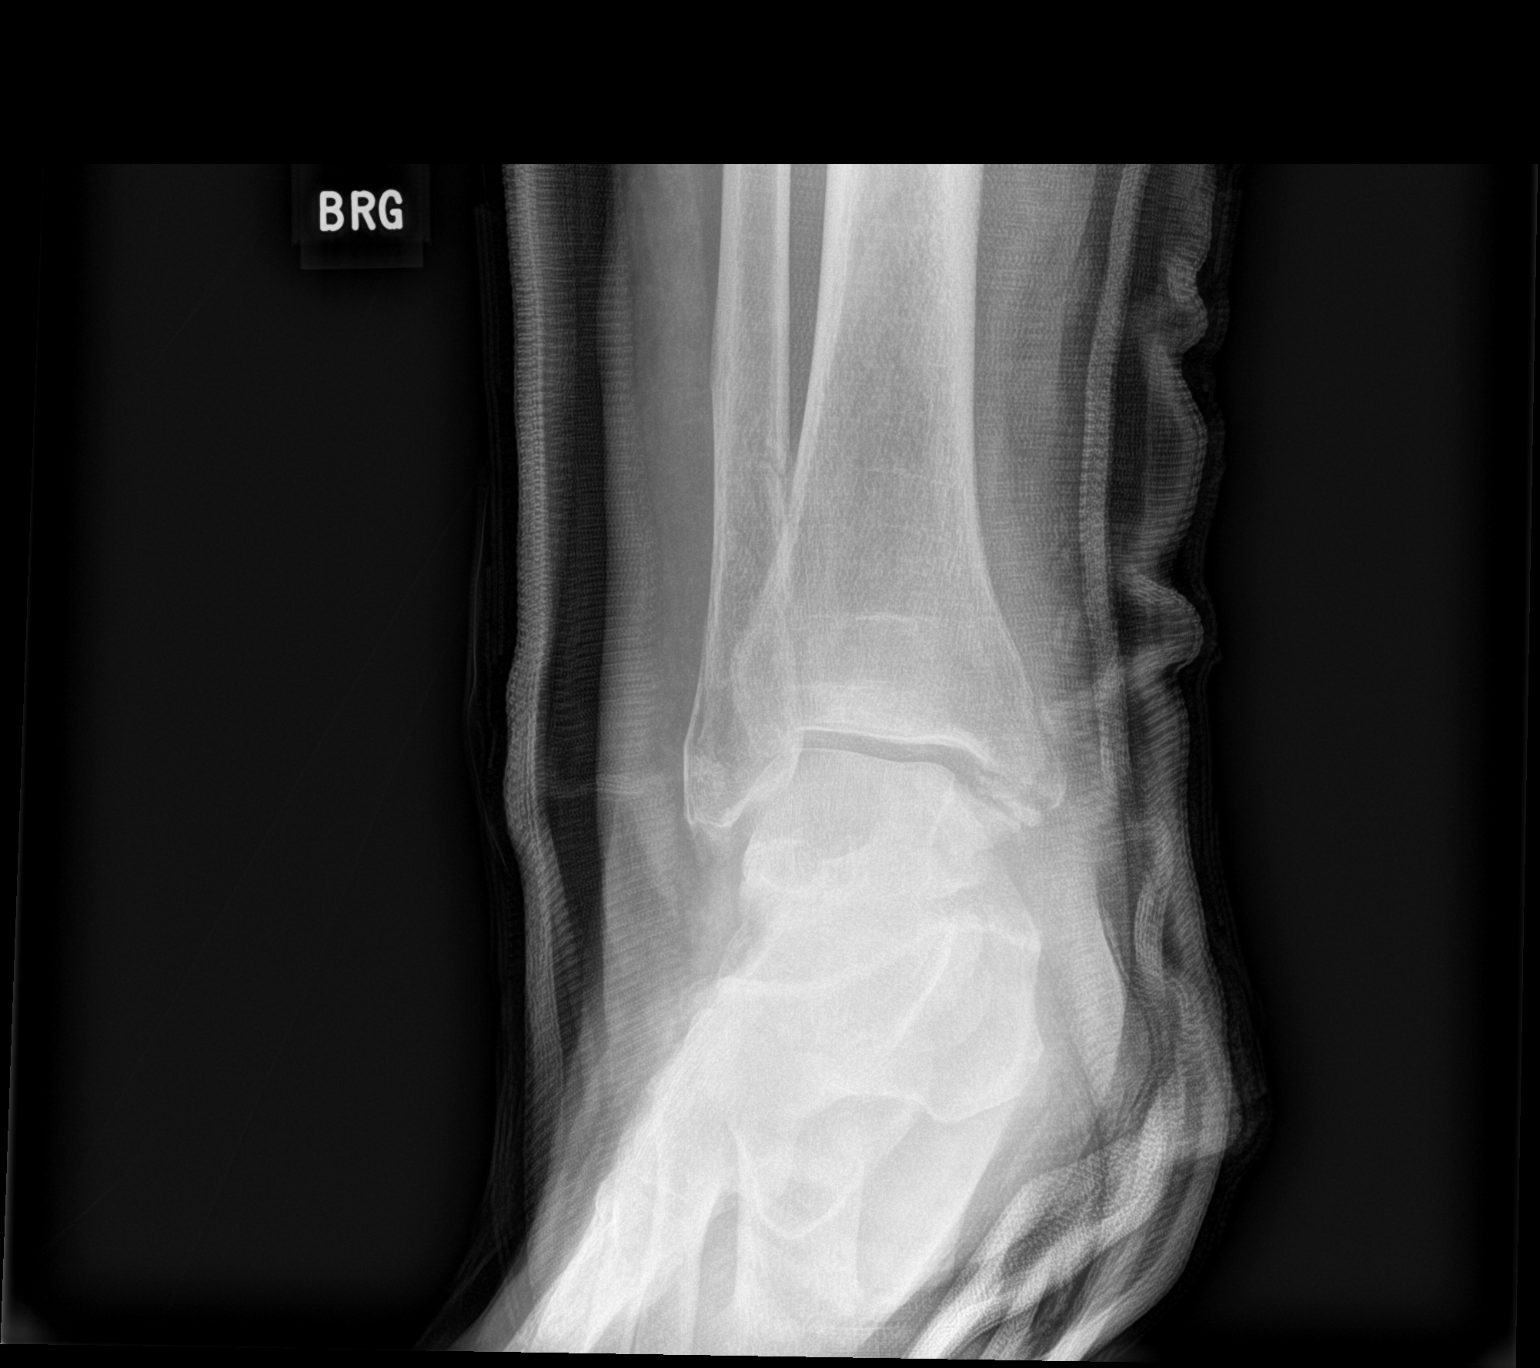

[ankle lat]
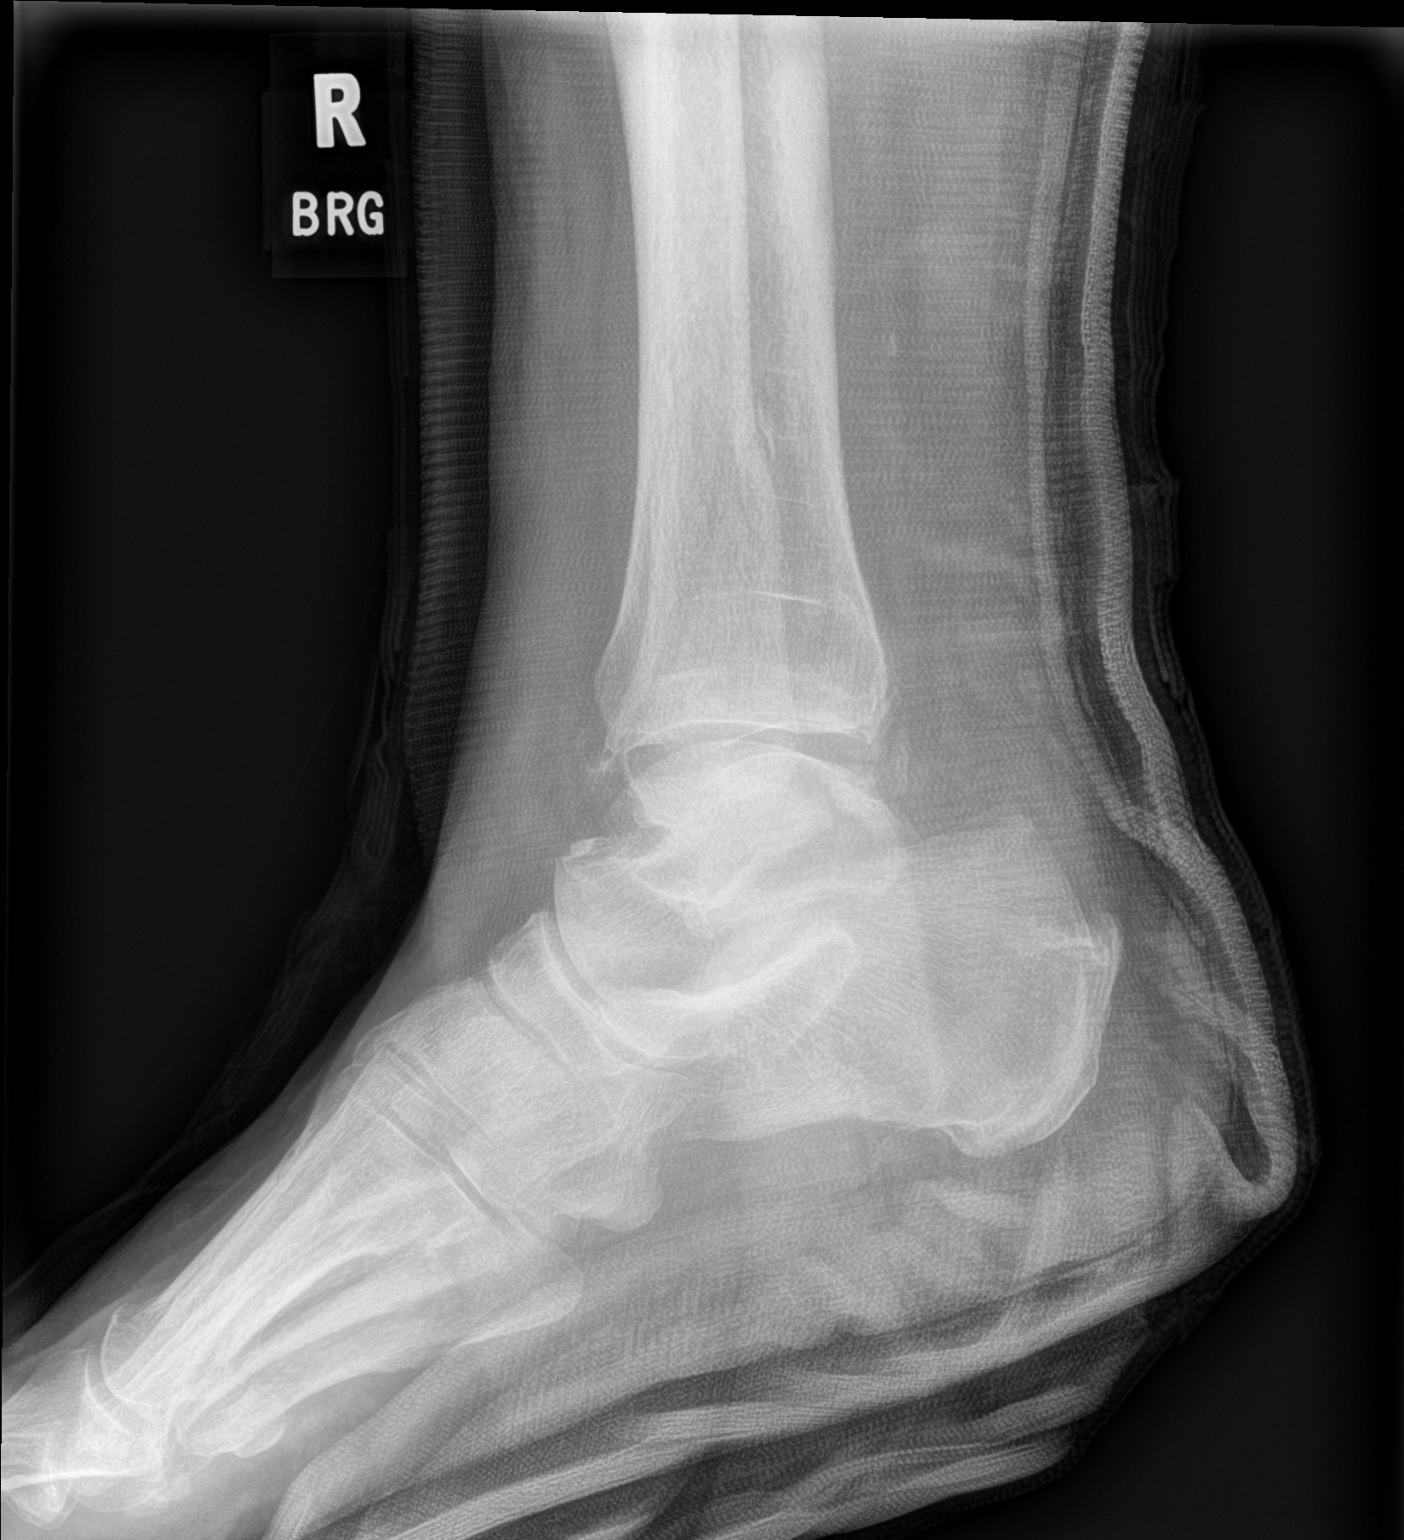

[2 of 2 positions shown; findings below may reference images not displayed]

FINDINGS: The right ankle has been casted and immobilized. Previously noted
talotibial subluxation has been successfully reduced. There is also
been significant reduction in mildly displaced distal right fibular
fracture. Medial malleolar fracture is again noted.
IMPRESSION: Successful reduction of previously noted talotibial subluxation.
Significant reduction in mildly displaced distal right fibular
fracture. Medial malleolar fracture is again noted.

## 2023-01-23 IMAGING — DX DG ANKLE COMPLETE 3+V*R*
3 series · 3 of 3 positions shown · non-contrast
Comparison: None.

CLINICAL DATA: Fall with right ankle injury and deformity

EXAM:
RIGHT ANKLE - COMPLETE 3+ VIEW

[ankle lat]
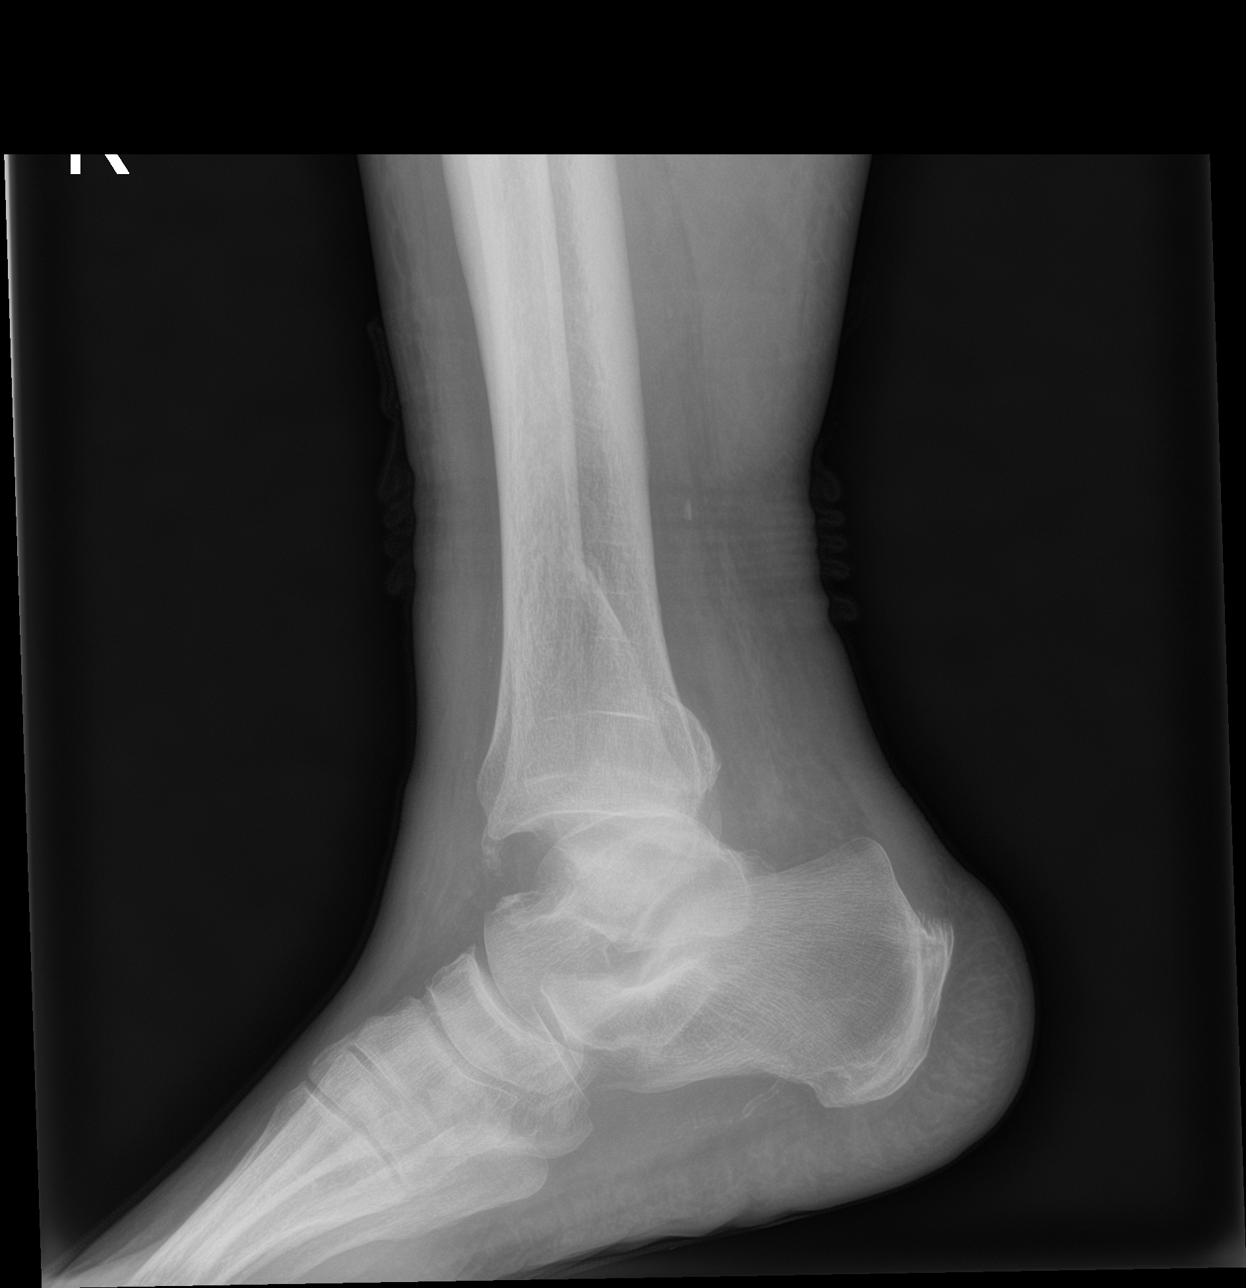

[ankle ap]
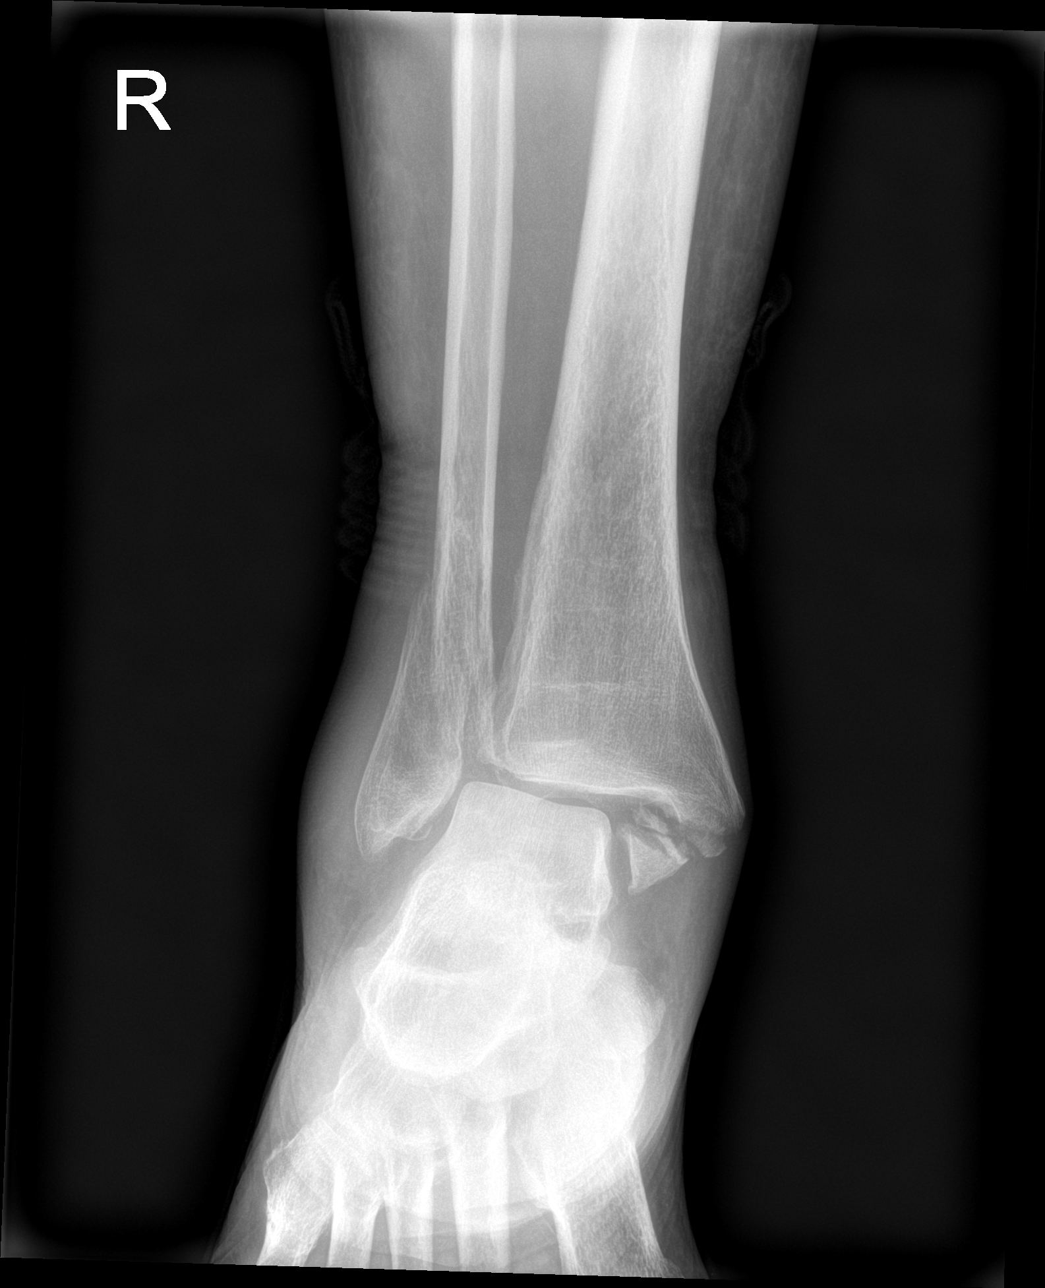

[ankle obl]
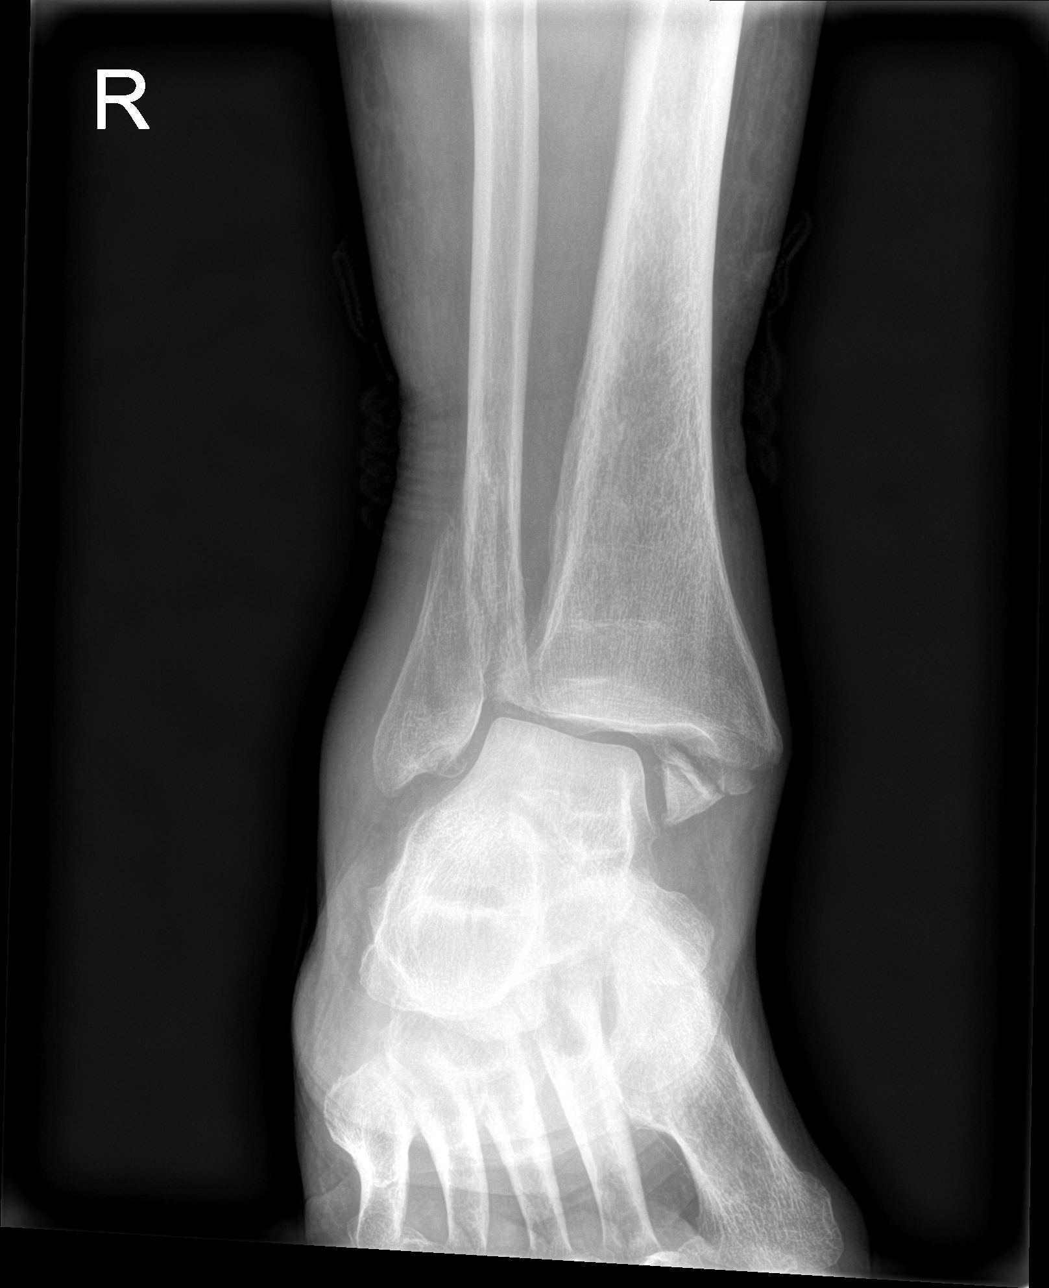

[3 of 3 positions shown; findings below may reference images not displayed]

FINDINGS: Trimalleolar right ankle fracture subluxation with oblique lateral
malleolus fracture with overriding and 5 mm lateral displacement of
the distal fracture fragment, medial malleolus fracture with 8 mm
lateral displacement of the distal fracture fragment and posterior
malleolus nondisplaced fracture. Lateral talar 8 mm subluxation at
the ankle mortise. No focal osseous lesions. Small Achilles right
calcaneal spurs. Diffuse right ankle soft tissue swelling, most
prominent laterally. Vascular calcifications in the soft tissues. No
radiopaque foreign bodies.
IMPRESSION: Trimalleolar right ankle fracture-subluxation as detailed.
